# Patient Record
Sex: Female | Born: 1955
Health system: Southern US, Community
[De-identification: ages and names within clinical notes are randomized; demographics above are authoritative.]

## PROBLEM LIST (undated history)

## (undated) DIAGNOSIS — R918 Other nonspecific abnormal finding of lung field: Secondary | ICD-10-CM

## (undated) DIAGNOSIS — K219 Gastro-esophageal reflux disease without esophagitis: Secondary | ICD-10-CM

## (undated) DIAGNOSIS — IMO0001 Reserved for inherently not codable concepts without codable children: Secondary | ICD-10-CM

## (undated) DIAGNOSIS — I10 Essential (primary) hypertension: Secondary | ICD-10-CM

## (undated) DIAGNOSIS — E032 Hypothyroidism due to medicaments and other exogenous substances: Secondary | ICD-10-CM

## (undated) DIAGNOSIS — D649 Anemia, unspecified: Secondary | ICD-10-CM

## (undated) DIAGNOSIS — C801 Malignant (primary) neoplasm, unspecified: Secondary | ICD-10-CM

## (undated) DIAGNOSIS — E785 Hyperlipidemia, unspecified: Secondary | ICD-10-CM

## (undated) DIAGNOSIS — E119 Type 2 diabetes mellitus without complications: Secondary | ICD-10-CM

## (undated) DIAGNOSIS — E278 Other specified disorders of adrenal gland: Secondary | ICD-10-CM

## (undated) DIAGNOSIS — C7951 Secondary malignant neoplasm of bone: Secondary | ICD-10-CM

## (undated) HISTORY — DX: Hypothyroidism due to medicaments and other exogenous substances: E03.2

## (undated) HISTORY — PX: APPENDECTOMY: SHX54

## (undated) HISTORY — DX: Secondary malignant neoplasm of bone: C79.51

---

## 2003-07-15 ENCOUNTER — Emergency Department (HOSPITAL_COMMUNITY): Admission: EM | Admit: 2003-07-15 | Discharge: 2003-07-15 | Payer: Self-pay | Admitting: Emergency Medicine

## 2003-07-15 ENCOUNTER — Encounter: Payer: Self-pay | Admitting: Emergency Medicine

## 2005-08-19 ENCOUNTER — Ambulatory Visit: Payer: Self-pay | Admitting: Gastroenterology

## 2005-08-30 ENCOUNTER — Ambulatory Visit: Payer: Self-pay | Admitting: Gastroenterology

## 2006-06-07 ENCOUNTER — Emergency Department (HOSPITAL_COMMUNITY): Admission: EM | Admit: 2006-06-07 | Discharge: 2006-06-07 | Payer: Self-pay | Admitting: Emergency Medicine

## 2007-12-13 ENCOUNTER — Ambulatory Visit: Payer: Self-pay | Admitting: Physician Assistant

## 2008-12-16 ENCOUNTER — Ambulatory Visit: Payer: Self-pay

## 2009-11-21 HISTORY — PX: FLEXIBLE SIGMOIDOSCOPY: SHX1649

## 2009-12-17 ENCOUNTER — Ambulatory Visit: Payer: Self-pay

## 2010-10-08 ENCOUNTER — Encounter: Payer: Self-pay | Admitting: Gastroenterology

## 2010-10-22 ENCOUNTER — Ambulatory Visit (HOSPITAL_COMMUNITY)
Admission: RE | Admit: 2010-10-22 | Discharge: 2010-10-22 | Payer: Self-pay | Source: Home / Self Care | Admitting: Gastroenterology

## 2010-10-28 ENCOUNTER — Encounter (INDEPENDENT_AMBULATORY_CARE_PROVIDER_SITE_OTHER): Payer: Self-pay

## 2010-12-21 NOTE — Letter (Signed)
Summary: TCS ORDER/TRIAGE  TCS ORDER/TRIAGE   Imported By: Rexene Alberts 10/08/2010 15:46:49  _____________________________________________________________________  External Attachment:    Type:   Image     Comment:   External Document

## 2010-12-21 NOTE — Letter (Signed)
Summary: Patient Notice, Colon Biopsy Results  [facility]  [address]   [place], [place] [address]   Phone: [phone]  Fax: [phone]       October 28, 2010   [facility] [address] [place], [place]  [address] Nov 29, 1955    Dear Ms. Hodges,  I am pleased to inform you that the biopsies taken during your recent colonoscopy did not show any evidence of cancer upon pathologic examination.  Additional information/recommendations:  __Please follow a high fiber diet  __Continue with the treatment plan as outlined on the day of your exam.  __You should have a repeat colonoscopy examination  in 5 years.  Please call us if you are having persistent problems or have questions about your condition that have not been fully answered at this time.  Sincerely,    Hendricks Limes LPN  [facility] Ph: [phone]    Fax: [phone]

## 2010-12-29 ENCOUNTER — Ambulatory Visit: Payer: Self-pay | Admitting: Family Medicine

## 2013-03-08 ENCOUNTER — Emergency Department (HOSPITAL_COMMUNITY)
Admission: EM | Admit: 2013-03-08 | Discharge: 2013-03-08 | Disposition: A | Discharge status: Home / Self Care | Payer: PRIVATE HEALTH INSURANCE | Attending: Emergency Medicine | Admitting: Emergency Medicine

## 2013-03-08 ENCOUNTER — Encounter (HOSPITAL_COMMUNITY): Payer: Self-pay | Admitting: *Deleted

## 2013-03-08 DIAGNOSIS — L509 Urticaria, unspecified: Secondary | ICD-10-CM | POA: Insufficient documentation

## 2013-03-08 DIAGNOSIS — Z79899 Other long term (current) drug therapy: Secondary | ICD-10-CM | POA: Insufficient documentation

## 2013-03-08 DIAGNOSIS — R6889 Other general symptoms and signs: Secondary | ICD-10-CM | POA: Insufficient documentation

## 2013-03-08 DIAGNOSIS — R21 Rash and other nonspecific skin eruption: Secondary | ICD-10-CM | POA: Insufficient documentation

## 2013-03-08 MED ORDER — PREDNISONE 20 MG PO TABS: ORAL_TABLET | ORAL | Status: DC

## 2013-03-08 MED ORDER — METHYLPREDNISOLONE SODIUM SUCC 125 MG IJ SOLR
125.0000 mg | Freq: Once | INTRAMUSCULAR | Status: AC
  Administered 2013-03-08: 125 mg via INTRAMUSCULAR
  Filled 2013-03-08: qty 2

## 2013-03-08 MED ORDER — FAMOTIDINE 20 MG PO TABS: 20.0000 mg | ORAL_TABLET | Freq: Two times a day (BID) | ORAL | Status: DC

## 2013-03-08 MED ORDER — FAMOTIDINE 20 MG PO TABS
20.0000 mg | ORAL_TABLET | Freq: Once | ORAL | Status: AC
  Administered 2013-03-08: 20 mg via ORAL
  Filled 2013-03-08: qty 1

## 2013-03-08 MED ORDER — DIPHENHYDRAMINE HCL 50 MG/ML IJ SOLN
50.0000 mg | Freq: Once | INTRAMUSCULAR | Status: AC
  Administered 2013-03-08: 50 mg via INTRAMUSCULAR
  Filled 2013-03-08: qty 1

## 2013-03-08 NOTE — ED Notes (Signed)
Hives to face and all over body , took benadryl 50 mg at 3pm.  Onset Monday

## 2013-03-08 NOTE — ED Provider Notes (Signed)
History    This chart was scribed for Ward Givens, MD by Donne Anon, ED Scribe. This patient was seen in room APA19/APA19 and the patient's care was started at 1648.   CSN: [id number]  Arrival date & time 03/08/13  1607   First MD Initiated Contact with Patient 03/08/13 1648      No chief complaint on file.   The history is provided by the patient. No language interpreter was used.   Melissa Sullivan is a 57 y.o. female who presents to the Emergency Department complaining of gradual onset, moderate hives and rash which began 5 days ago. She states she has never had hives due to pollen before, but has had sneezing and an itchy throat. She denies swelling of her lips, tongue or throat, difficulty swallowing, fever, wheeing, difficulty breathing or any other pain. She denies any new exposures. She has tried Benadryl (2 pills today) at 3 pm which relieved the itching but not the hives.   Her PCP is Economist, PA-C at [facility].   She currently takes cholesteral and a stomach acid pill.  History reviewed. No pertinent past medical history.  Past Surgical History  Procedure Laterality Date  . Appendectomy      History reviewed. No pertinent family history.  History  Substance Use Topics  . Smoking status: Never Smoker   . Smokeless tobacco: Not on file  . Alcohol Use: No  employed  OB History   Grav Para Term Preterm Abortions TAB SAB Ect Mult Living                  Review of Systems  Constitutional: Negative for fever.  HENT: Positive for sneezing. Negative for facial swelling and trouble swallowing.   Respiratory: Negative for shortness of breath and wheezing.   Skin: Positive for rash.  All other systems reviewed and are negative.    Allergies  Review of patient's allergies indicates no known allergies.  Home Medications   Current Outpatient Rx  Name  Route  Sig  Dispense  Refill  . diphenhydrAMINE (BENADRYL) 25 MG tablet   Oral   Take 50 mg by  mouth once as needed for itching or allergies.         Marland Kitchen omeprazole (PRILOSEC) 20 MG capsule   Oral   Take 20 mg by mouth daily.         . simvastatin (ZOCOR) 20 MG tablet   Oral   Take 20 mg by mouth every evening.           BP 144/86  Pulse 100  Temp(Src) 98.1 F (36.7 C) (Oral)  Resp 18  Ht 4\' 11"  (1.499 m)  Wt 104 lb (47.174 kg)  BMI 20.99 kg/m2  Vital signs normal    Physical Exam  Nursing note and vitals reviewed. Constitutional: She is oriented to person, place, and time. She appears well-developed and well-nourished.  Non-toxic appearance. She does not appear ill. No distress.  HENT:  Head: Normocephalic and atraumatic.  Right Ear: External ear normal.  Left Ear: External ear normal.  Nose: Nose normal. No mucosal edema or rhinorrhea.  Mouth/Throat: Oropharynx is clear and moist and mucous membranes are normal. No dental abscesses or edematous.  No swelling of the oropharynx.  Eyes: Conjunctivae and EOM are normal. Pupils are equal, round, and reactive to light.  Neck: Normal range of motion and full passive range of motion without pain. Neck supple.  Cardiovascular: Normal rate, regular rhythm and  normal heart sounds.  Exam reveals no gallop and no friction rub.   No murmur heard. Pulmonary/Chest: Effort normal and breath sounds normal. No respiratory distress. She has no wheezes. She has no rhonchi. She has no rales. She exhibits no tenderness and no crepitus.  Abdominal: Soft. Normal appearance and bowel sounds are normal. She exhibits no distension. There is no tenderness. There is no rebound and no guarding.  Musculoskeletal: Normal range of motion. She exhibits no edema and no tenderness.  Moves all extremities well.   Neurological: She is alert and oriented to person, place, and time. She has normal strength. No cranial nerve deficit.  Skin: Skin is warm, dry and intact. No rash noted. No erythema. No pallor.  Diffuse sometimes coalescing red raised  urticarial type lesions over her whole body. No involvement of the palms or soles.  Psychiatric: She has a normal mood and affect. Her speech is normal and behavior is normal. Her mood appears not anxious.    ED Course  Procedures (including critical care time)  Medications  diphenhydrAMINE (BENADRYL) injection 50 mg (50 mg Intramuscular Given 03/08/13 1722)  methylPREDNISolone sodium succinate (SOLU-MEDROL) 125 mg/2 mL injection 125 mg (125 mg Intramuscular Given 03/08/13 1722)  famotidine (PEPCID) tablet 20 mg (20 mg Oral Given 03/08/13 1722)    DIAGNOSTIC STUDIES: None performed.   COORDINATION OF CARE: 5:10 PM Discussed treatment plan which includes medication with pt at bedside and pt agreed to plan.   6:18 PM Rechecked pt. She states that the rash is still present but the itching is improving. Family feels it is looking better.  Will discharge pt. Advised pt to return is she starts experiencing swelling on the lips or tongue.    1. Urticarial rash    New Prescriptions   FAMOTIDINE (PEPCID) 20 MG TABLET    Take 1 tablet (20 mg total) by mouth 2 (two) times daily.   PREDNISONE (DELTASONE) 20 MG TABLET    Take 3 po QD x 2d starting tomorrow, then 2 po QD x 3d then 1 po QD x 3d  benadryl or zyrtec OTC  Plan discharge  Devoria Albe, MD, FACEP    MDM   I personally performed the services described in this documentation, which was scribed in my presence. The recorded information has been reviewed and considered.  Devoria Albe, MD, Armando Gang         Ward Givens, MD 03/08/13 [phone]

## 2013-11-21 DIAGNOSIS — C801 Malignant (primary) neoplasm, unspecified: Secondary | ICD-10-CM

## 2013-11-21 HISTORY — DX: Malignant (primary) neoplasm, unspecified: C80.1

## 2014-07-22 HISTORY — PX: LUNG BIOPSY: SHX232

## 2014-07-28 ENCOUNTER — Emergency Department (HOSPITAL_COMMUNITY): Payer: PRIVATE HEALTH INSURANCE

## 2014-07-28 ENCOUNTER — Encounter (HOSPITAL_COMMUNITY): Payer: Self-pay | Admitting: Emergency Medicine

## 2014-07-28 ENCOUNTER — Emergency Department (HOSPITAL_COMMUNITY)
Admission: EM | Admit: 2014-07-28 | Discharge: 2014-07-28 | Disposition: A | Discharge status: Home / Self Care | Payer: PRIVATE HEALTH INSURANCE | Attending: Family Medicine | Admitting: Family Medicine

## 2014-07-28 DIAGNOSIS — K219 Gastro-esophageal reflux disease without esophagitis: Secondary | ICD-10-CM | POA: Insufficient documentation

## 2014-07-28 DIAGNOSIS — E876 Hypokalemia: Secondary | ICD-10-CM | POA: Diagnosis not present

## 2014-07-28 DIAGNOSIS — F17200 Nicotine dependence, unspecified, uncomplicated: Secondary | ICD-10-CM | POA: Diagnosis not present

## 2014-07-28 DIAGNOSIS — M25519 Pain in unspecified shoulder: Secondary | ICD-10-CM | POA: Diagnosis present

## 2014-07-28 DIAGNOSIS — E785 Hyperlipidemia, unspecified: Secondary | ICD-10-CM | POA: Insufficient documentation

## 2014-07-28 DIAGNOSIS — I10 Essential (primary) hypertension: Secondary | ICD-10-CM | POA: Insufficient documentation

## 2014-07-28 DIAGNOSIS — E279 Disorder of adrenal gland, unspecified: Secondary | ICD-10-CM

## 2014-07-28 DIAGNOSIS — Z79899 Other long term (current) drug therapy: Secondary | ICD-10-CM | POA: Insufficient documentation

## 2014-07-28 DIAGNOSIS — E278 Other specified disorders of adrenal gland: Secondary | ICD-10-CM | POA: Diagnosis not present

## 2014-07-28 DIAGNOSIS — R222 Localized swelling, mass and lump, trunk: Secondary | ICD-10-CM

## 2014-07-28 DIAGNOSIS — R918 Other nonspecific abnormal finding of lung field: Secondary | ICD-10-CM

## 2014-07-28 HISTORY — DX: Other nonspecific abnormal finding of lung field: R91.8

## 2014-07-28 HISTORY — DX: Gastro-esophageal reflux disease without esophagitis: K21.9

## 2014-07-28 HISTORY — DX: Reserved for inherently not codable concepts without codable children: IMO0001

## 2014-07-28 HISTORY — DX: Other specified disorders of adrenal gland: E27.8

## 2014-07-28 HISTORY — DX: Essential (primary) hypertension: I10

## 2014-07-28 HISTORY — DX: Hyperlipidemia, unspecified: E78.5

## 2014-07-28 LAB — CBC WITH DIFFERENTIAL/PLATELET
BASOS ABS: 0 10*3/uL (ref 0.0–0.1)
BASOS PCT: 0 % (ref 0–1)
Eosinophils Absolute: 0.2 10*3/uL (ref 0.0–0.7)
Eosinophils Relative: 1 % (ref 0–5)
HCT: 34.8 % — ABNORMAL LOW (ref 36.0–46.0)
Hemoglobin: 11.8 g/dL — ABNORMAL LOW (ref 12.0–15.0)
Lymphocytes Relative: 26 % (ref 12–46)
Lymphs Abs: 3.7 10*3/uL (ref 0.7–4.0)
MCH: 29.7 pg (ref 26.0–34.0)
MCHC: 33.9 g/dL (ref 30.0–36.0)
MCV: 87.7 fL (ref 78.0–100.0)
MONO ABS: 0.7 10*3/uL (ref 0.1–1.0)
Monocytes Relative: 5 % (ref 3–12)
NEUTROS ABS: 9.9 10*3/uL — AB (ref 1.7–7.7)
NEUTROS PCT: 68 % (ref 43–77)
Platelets: 413 10*3/uL — ABNORMAL HIGH (ref 150–400)
RBC: 3.97 MIL/uL (ref 3.87–5.11)
RDW: 15.5 % (ref 11.5–15.5)
WBC: 14.5 10*3/uL — ABNORMAL HIGH (ref 4.0–10.5)

## 2014-07-28 LAB — BASIC METABOLIC PANEL
ANION GAP: 18 — AB (ref 5–15)
BUN: 11 mg/dL (ref 6–23)
CHLORIDE: 96 meq/L (ref 96–112)
CO2: 26 mEq/L (ref 19–32)
CREATININE: 0.72 mg/dL (ref 0.50–1.10)
Calcium: 9.9 mg/dL (ref 8.4–10.5)
GFR calc non Af Amer: >90 mL/min (ref >90)
Glucose, Bld: 81 mg/dL (ref 70–99)
POTASSIUM: 2.8 meq/L — AB (ref 3.7–5.3)
Sodium: 140 mEq/L (ref 137–147)

## 2014-07-28 MED ORDER — MORPHINE SULFATE 4 MG/ML IJ SOLN
4.0000 mg | INTRAMUSCULAR | Status: DC | PRN
  Administered 2014-07-28: 4 mg via INTRAVENOUS
  Filled 2014-07-28: qty 1

## 2014-07-28 MED ORDER — POTASSIUM CHLORIDE 20 MEQ/15ML (10%) PO LIQD
40.0000 meq | Freq: Once | ORAL | Status: AC
Start: 2014-07-28 — End: 2014-07-28
  Administered 2014-07-28: 40 meq via ORAL
  Filled 2014-07-28: qty 30

## 2014-07-28 MED ORDER — POTASSIUM CHLORIDE ER 20 MEQ PO TBCR: 20.0000 meq | EXTENDED_RELEASE_TABLET | Freq: Two times a day (BID) | ORAL | Status: DC

## 2014-07-28 MED ORDER — IOHEXOL 300 MG/ML  SOLN
80.0000 mL | Freq: Once | INTRAMUSCULAR | Status: AC | PRN
Start: 2014-07-28 — End: 2014-07-28
  Administered 2014-07-28: 80 mL via INTRAVENOUS

## 2014-07-28 MED ORDER — HYDROCODONE-ACETAMINOPHEN 5-325 MG PO TABS: 1.0000 | ORAL_TABLET | Freq: Four times a day (QID) | ORAL | Status: DC | PRN

## 2014-07-28 NOTE — Consult Note (Addendum)
Medical Consult  Melissa Sullivan AOZ:[id number] DOB: 08-Dec-1955 DOA: 07/28/2014  Requesting physician: Francine Graven, MD in ED Reason for consultation: chest masxs  PCP: [name],[name], PA-C   Assessment/Plan 1. Large right apical mass suspicious for malignancy with evidence of metastatic disease to right adrenal gland. 2. Back and arm pain secondary to mass 3. Hypokalemia 4. Tobacco dependence   Discussed imaging results with Dr. Barbie Banner, interventional radiology. The apical mass is amenable to biopsy. The trachea and esophagus are widely patent. The IVC is patent and the right kidney has no evidence of ischemic changes. There are no findings requiring admission or urgent intervention. Discussed with Dr. Lona Kettle, oncology in [place]. Patient will need biopsy and then likely chemotherapy +/- radiation.  Discussed the above with patient, her husband, her daughter and niece. Reviewed treatment options including admission to [facility] with coordination of biopsy as inpatient, versus transfer to [facility] for biopsy and further care. She requests discharge home with outpatient biopsy and outpatient follow-up with the [facility]. She has a lot of support and appears reliable, therefore rec discharge home with oral narcotics and potassium supplementation.  In AM I will arrange for outpatient biopsy ASAP and follow-up with [facility] thereafter.   I have counseled her to seek medical attention for uncontrolled pain, SOB, dysphagia, weakness, or worsening of condition.   I discussed my recommendations with Dr. Milana Na partner Dr. Hillard Danker.  Patient instructions on discharge: Seek immediate medical assistance for uncontrolled pain, SOB, dysphagia, weakness, or worsening of condition. You will receive a call from the interventional radiology department at [facility] in [place] 9/8 to schedule a biopsy. You will also receive a call from the [facility] 9/8 to schedule an appointment. You have been  prescribed a pain medication called Lortab and a few potassium pills to treat low potassium.   Chief Complaint: right back and arm pain  HPI:  58 year old woman presented to [facility] ED with h/o back pain. Imaging revealed large right apical mass with evidence of metastatic disease. Medicine consulted for further recommendations.  Patient reports intermittent back and right arm pain since late 2014, but has become constant and progressively more severe over the last several months failing conservative therapy. Pain aggravated by movement of right arm and somewhat relieved when lying on right side. No weakness or neurologic symptoms. She has had weight loss and loss of appetite. No new SOB, no dysphagia. No systemic symptoms.  In the emergency department afebrile, VSS, no hypoxia. K+ 2.8, BMP otherwise unremarkable, Hgb 11.8.  CT chest as below.  Review of Systems:  Negative for fever, visual changes, sore throat, rash, new muscle aches, chest pain, SOB, dysuria, bleeding, n/v/abdominal pain.  Past Medical History  Diagnosis Date  . Hypertension   . Reflux   . Hyperlipidemia     Past Surgical History  Procedure Laterality Date  . Appendectomy      Social History:  reports that she has been smoking Cigarettes.  She has been smoking about 0.30 packs per day. She does not have any smokeless tobacco history on file. She reports that she does not drink alcohol or use illicit drugs.  No Known Allergies  Family History  Problem Relation Age of Onset  . Cancer Sister      Prior to Admission medications   Medication Sig Start Date End Date Taking? Authorizing Provider  hydrochlorothiazide (MICROZIDE) 12.5 MG capsule Take 12.5 mg by mouth daily.   Yes Historical Provider, MD  ibuprofen (  ADVIL,MOTRIN) 200 MG tablet Take 400 mg by mouth every 6 (six) hours as needed for headache or mild pain.   Yes Historical Provider, MD  Multiple Vitamin (MULTIVITAMIN WITH MINERALS) TABS tablet Take 1 tablet  by mouth daily.   Yes Historical Provider, MD  omeprazole (PRILOSEC) 20 MG capsule Take 20 mg by mouth daily.   Yes Historical Provider, MD  simvastatin (ZOCOR) 40 MG tablet Take 40 mg by mouth daily.   Yes Historical Provider, MD   Physical Exam: Filed Vitals:   07/28/14 1131 07/28/14 1359 07/28/14 1417  BP: 125/81 120/82 127/75  Pulse: 91 85 78  Temp: 98.8 F (37.1 C)    TempSrc: Oral    Resp: 17 20 16   Height: 4\' 11"  (1.499 m)    Weight: 46.267 kg (102 lb)    SpO2: 99% 100% 100%    General: Examined in emergency department.  Appears calm, mildly uncomfortable. Ambulates without assistance.  Eyes: PERRL, normal lids, irises  ENT: grossly normal hearing, lips & tongue Neck: no LAD, masses or thyromegaly Cardiovascular: RRR, no m/r/g. No LE edema. Respiratory: CTA bilaterally, no w/r/r. Normal respiratory effort. Abdomen: soft, ntnd, no masses appreciated Skin: no rash or induration Musculoskeletal: grossly normal tone/movement BUE/BLE. Tender to palpation over right upper back, anterior chest.  Breasts: examined with chaperon Barnett Applebaum, RN. Bilateral breasts unremarkable on exam. Psychiatric: grossly normal mood and affect, speech fluent and appropriate Neurologic: grossly non-focal.  Wt Readings from Last 3 Encounters:  07/28/14 46.267 kg (102 lb)  03/08/13 47.174 kg (104 lb)    Labs on Admission:  Basic Metabolic Panel:  Recent Labs Lab 07/28/14 1253  NA 140  K 2.8*  CL 96  CO2 26  GLUCOSE 81  BUN 11  CREATININE 0.72  CALCIUM 9.9     CBC:  Recent Labs Lab 07/28/14 1253  WBC 14.5*  NEUTROABS 9.9*  HGB 11.8*  HCT 34.8*  MCV 87.7  PLT 413*    Radiological Exams on Admission: Dg Shoulder Right  07/28/2014   CLINICAL DATA:  Right shoulder pain for 1 month.  No injury.  EXAM: RIGHT SHOULDER - 2+ VIEW  COMPARISON:  None.  FINDINGS: Abnormal thick density at the right lung apex, with apparent erosion/destruction of the right second rib. Appearance highly  concerning for Pancoast tumor, possibly with associated right upper lobe atelectasis. Chest CT with contrast recommended for further workup.  IMPRESSION: 1. Suspected right apical malignancy with destruction of the right second rib. Possible right upper lobe atelectasis is associated. CT chest with contrast recommended. These results were called by telephone at the time of interpretation on 07/28/2014 at 12:26 pm to the [facility] ED physician, who verbally acknowledged these results.   Electronically Signed   By: Sherryl Barters M.D.   On: 07/28/2014 12:29   Ct Chest W Contrast  07/28/2014   CLINICAL DATA:  58 year old female presenting with right shoulder pain. Abnormal plain film exam. Hypertension. Hyperlipidemia.  EXAM: CT CHEST WITH CONTRAST  TECHNIQUE: Multidetector CT imaging of the chest was performed during intravenous contrast administration.  CONTRAST:  69mL OMNIPAQUE IOHEXOL 300 MG/ML  SOLN  COMPARISON:  07/28/2014 plain film exam of the right shoulder. No comparison CT.  FINDINGS: 8.9 x 8.1 x 6.8 cm right apical destructive mass consistent with malignancy (Pancoast tumor). This destroys the posterior aspect of the right second rib with extension into adjacent soft tissues posterior to the destroyed rib and supraclavicular region. Additionally, tumor extends partially into the right T2-3  and T3-4 neural foramen. This mass extends into the mediastinum displacing the esophagus to left and compresses the posterior aspect of the trachea. Invasion of the structures cannot be excluded.  Right hilar adenopathy with transverse short axis dimension 1.3 cm.  5 cm right adrenal mass most consistent with metastatic disease. This impresses upon the right kidney, liver, inferior vena cava (which is narrowed) and surrounds the right renal artery right and right renal vein which are significantly narrowed. This metastatic lesion may extend through the right crus of the diaphragm.  Parenchymal changes medial aspect of  the left upper lobe may represent inflammatory process rather than spread tumor (which cannot be entirely excluded).  Exam was not performed with pulmonary embolus technique however, no obvious pulmonary embolus detected.  Heart size within normal limits.  IMPRESSION: Large right apical mass consistent with malignancy. This is destroying the right second rib with extension into adjacent soft tissue (posterior to the ribs and right supraclavicular region). There is a suggestion of extension tumor partially into the right T2-3 in T3-4 neural foramen. Suggestion of minimal erosion of these vertebra. This mass extends into the mediastinum displacing the trachea and esophagus and at points is inseparable from the structures.  Right hilar adenopathy.  Right adrenal 5 cm mass consistent with metastatic disease causing compression of surrounding structures including narrowing of the inferior vena cava and significant narrowing of the right renal artery and vein.  Parenchymal changes medial aspect left upper lobe may represent result of inflammatory process versus tumor.  These results were called by telephone at the time of interpretation on 07/28/2014 at 2:15 pm to Dr. Francine Graven , who verbally acknowledged these results.   Electronically Signed   By: Chauncey Cruel M.D.   On: 07/28/2014 14:16     Active Problems:   * No active hospital problems. *      Time spent: 60 minutes  Murray Hodgkins, MD  [facility] Pager [phone] 07/28/2014, 3:08 PM

## 2014-07-28 NOTE — Discharge Instructions (Signed)
Seek immediate medical assistance for uncontrolled pain, SOB, dysphagia, weakness, or worsening of condition. You will receive a call from the interventional radiology department at [facility] in [place] 9/8 to schedule a biopsy. You will also receive a call from the [facility] 9/8 to schedule an appointment. You have been prescribed a pain medication called Lortab and a few potassium pills to treat low potassium.

## 2014-07-28 NOTE — ED Notes (Signed)
Notified MD on critical value from laboratory of Potassium level 2.8. Will keep monitoring pt.

## 2014-07-28 NOTE — ED Notes (Signed)
Pt c/o of RT shoulder pain for past month. Pt denies any injury.

## 2014-07-28 NOTE — ED Provider Notes (Signed)
CSN: [id number]     Arrival date & time 07/28/14  1126 History   First MD Initiated Contact with Patient 07/28/14 1233     Chief Complaint  Patient presents with  . Shoulder Pain     HPI Pt was seen at 1230.  Per pt and her family, c/o gradual onset and worsening of persistent right shoulder "pain" for the past 1+ month. Pt states she has been evaluated by her PMD for same and tx with physical therapy, which pt did not feel improved her pain. Describes her pain as "sharp" and constant, worsens with palpation of her right shoulder and upper chest. Denies injury, no CP/palpitations, no SOB/cough, no back pain, no abd pain, no N/V/D, no fevers, no rash, no focal motor weakness, no tingling/numbness in extremities.    Past Medical History  Diagnosis Date  . Hypertension   . Reflux   . Hyperlipidemia    Past Surgical History  Procedure Laterality Date  . Appendectomy     Family History  Problem Relation Age of Onset  . Cancer Sister    History  Substance Use Topics  . Smoking status: Light Tobacco Smoker -- 0.30 packs/day    Types: Cigarettes  . Smokeless tobacco: Not on file  . Alcohol Use: No    Review of Systems ROS: Statement: All systems negative except as marked or noted in the HPI; Constitutional: Negative for fever and chills. ; ; Eyes: Negative for eye pain, redness and discharge. ; ; ENMT: Negative for ear pain, hoarseness, nasal congestion, sinus pressure and sore throat. ; ; Cardiovascular: Negative for chest pain, palpitations, diaphoresis, dyspnea and peripheral edema. ; ; Respiratory: Negative for cough, wheezing and stridor. ; ; Gastrointestinal: Negative for nausea, vomiting, diarrhea, abdominal pain, blood in stool, hematemesis, jaundice and rectal bleeding. . ; ; Genitourinary: Negative for dysuria, flank pain and hematuria. ; ; Musculoskeletal: +right shoulder area pain. Negative for back pain and neck pain. Negative for swelling and trauma.; ; Skin: Negative for  pruritus, rash, abrasions, blisters, bruising and skin lesion.; ; Neuro: Negative for headache, lightheadedness and neck stiffness. Negative for weakness, altered level of consciousness , altered mental status, extremity weakness, paresthesias, involuntary movement, seizure and syncope.      Allergies  Review of patient's allergies indicates no known allergies.  Home Medications   Prior to Admission medications   Medication Sig Start Date End Date Taking? Authorizing Provider  hydrochlorothiazide (MICROZIDE) 12.5 MG capsule Take 12.5 mg by mouth daily.   Yes Historical Provider, MD  ibuprofen (ADVIL,MOTRIN) 200 MG tablet Take 400 mg by mouth every 6 (six) hours as needed for headache or mild pain.   Yes Historical Provider, MD  Multiple Vitamin (MULTIVITAMIN WITH MINERALS) TABS tablet Take 1 tablet by mouth daily.   Yes Historical Provider, MD  omeprazole (PRILOSEC) 20 MG capsule Take 20 mg by mouth daily.   Yes Historical Provider, MD  simvastatin (ZOCOR) 40 MG tablet Take 40 mg by mouth daily.   Yes Historical Provider, MD   BP 127/75  Pulse 78  Temp(Src) 98.8 F (37.1 C) (Oral)  Resp 16  Ht 4\' 11"  (1.499 m)  Wt 102 lb (46.267 kg)  BMI 20.59 kg/m2  SpO2 100% Physical Exam 1235: Physical examination:  Nursing notes reviewed; Vital signs and O2 SAT reviewed;  Constitutional: Well developed, Well nourished, Well hydrated, In no acute distress; Head:  Normocephalic, atraumatic; Eyes: EOMI, PERRL, No scleral icterus; ENMT: Mouth and pharynx normal, Mucous membranes moist; Neck:  Supple, Full range of motion, No lymphadenopathy; Cardiovascular: Regular rate and rhythm, No murmur, rub, or gallop; Respiratory: Breath sounds clear & equal bilaterally, No rales, rhonchi, wheezes.  Speaking full sentences with ease, Normal respiratory effort/excursion; Chest: +right proximal anterior and lateral chest wall tenderness to palp. No rash, no deformity. Movement normal; Abdomen: Soft, Nontender,  Nondistended, Normal bowel sounds; Genitourinary: No CVA tenderness; Extremities: Pulses normal, +generalized TTP right anterior shoulder, right clavicle, and right proximal anterior and lateral chest wall. No deformity, no rash, no edema.; Neuro: AA&Ox3, Major CN grossly intact.  Speech clear. No gross focal motor or sensory deficits in extremities. Climbs on and off stretcher easily by herself. Gait steady.; Skin: Color normal, Warm, Dry.   ED Course  Procedures     EKG Interpretation None      MDM  MDM Reviewed: previous chart, nursing note and vitals Reviewed previous: labs Interpretation: labs, x-ray and CT scan    Results for orders placed during the hospital encounter of 07/28/14  CBC WITH DIFFERENTIAL      Result Value Ref Range   WBC 14.5 (*) 4.0 - 10.5 K/uL   RBC 3.97  3.87 - 5.11 MIL/uL   Hemoglobin 11.8 (*) 12.0 - 15.0 g/dL   HCT 34.8 (*) 36.0 - 46.0 %   MCV 87.7  78.0 - 100.0 fL   MCH 29.7  26.0 - 34.0 pg   MCHC 33.9  30.0 - 36.0 g/dL   RDW 15.5  11.5 - 15.5 %   Platelets 413 (*) 150 - 400 K/uL   Neutrophils Relative % 68  43 - 77 %   Neutro Abs 9.9 (*) 1.7 - 7.7 K/uL   Lymphocytes Relative 26  12 - 46 %   Lymphs Abs 3.7  0.7 - 4.0 K/uL   Monocytes Relative 5  3 - 12 %   Monocytes Absolute 0.7  0.1 - 1.0 K/uL   Eosinophils Relative 1  0 - 5 %   Eosinophils Absolute 0.2  0.0 - 0.7 K/uL   Basophils Relative 0  0 - 1 %   Basophils Absolute 0.0  0.0 - 0.1 K/uL  BASIC METABOLIC PANEL      Result Value Ref Range   Sodium 140  137 - 147 mEq/L   Potassium 2.8 (*) 3.7 - 5.3 mEq/L   Chloride 96  96 - 112 mEq/L   CO2 26  19 - 32 mEq/L   Glucose, Bld 81  70 - 99 mg/dL   BUN 11  6 - 23 mg/dL   Creatinine, Ser 0.72  0.50 - 1.10 mg/dL   Calcium 9.9  8.4 - 10.5 mg/dL   GFR calc non Af Amer >90  >90 mL/min   GFR calc Af Amer >90  >90 mL/min   Anion gap 18 (*) 5 - 15   Dg Shoulder Right 07/28/2014   CLINICAL DATA:  Right shoulder pain for 1 month.  No injury.  EXAM:  RIGHT SHOULDER - 2+ VIEW  COMPARISON:  None.  FINDINGS: Abnormal thick density at the right lung apex, with apparent erosion/destruction of the right second rib. Appearance highly concerning for Pancoast tumor, possibly with associated right upper lobe atelectasis. Chest CT with contrast recommended for further workup.  IMPRESSION: 1. Suspected right apical malignancy with destruction of the right second rib. Possible right upper lobe atelectasis is associated. CT chest with contrast recommended. These results were called by telephone at the time of interpretation on 07/28/2014 at 12:26 pm to the  [facility] ED physician, who verbally acknowledged these results.   Electronically Signed   By: Sherryl Barters M.D.   On: 07/28/2014 12:29   Ct Chest W Contrast 07/28/2014   CLINICAL DATA:  58 year old female presenting with right shoulder pain. Abnormal plain film exam. Hypertension. Hyperlipidemia.  EXAM: CT CHEST WITH CONTRAST  TECHNIQUE: Multidetector CT imaging of the chest was performed during intravenous contrast administration.  CONTRAST:  53mL OMNIPAQUE IOHEXOL 300 MG/ML  SOLN  COMPARISON:  07/28/2014 plain film exam of the right shoulder. No comparison CT.  FINDINGS: 8.9 x 8.1 x 6.8 cm right apical destructive mass consistent with malignancy (Pancoast tumor). This destroys the posterior aspect of the right second rib with extension into adjacent soft tissues posterior to the destroyed rib and supraclavicular region. Additionally, tumor extends partially into the right T2-3 and T3-4 neural foramen. This mass extends into the mediastinum displacing the esophagus to left and compresses the posterior aspect of the trachea. Invasion of the structures cannot be excluded.  Right hilar adenopathy with transverse short axis dimension 1.3 cm.  5 cm right adrenal mass most consistent with metastatic disease. This impresses upon the right kidney, liver, inferior vena cava (which is narrowed) and surrounds the right renal  artery right and right renal vein which are significantly narrowed. This metastatic lesion may extend through the right crus of the diaphragm.  Parenchymal changes medial aspect of the left upper lobe may represent inflammatory process rather than spread tumor (which cannot be entirely excluded).  Exam was not performed with pulmonary embolus technique however, no obvious pulmonary embolus detected.  Heart size within normal limits.  IMPRESSION: Large right apical mass consistent with malignancy. This is destroying the right second rib with extension into adjacent soft tissue (posterior to the ribs and right supraclavicular region). There is a suggestion of extension tumor partially into the right T2-3 in T3-4 neural foramen. Suggestion of minimal erosion of these vertebra. This mass extends into the mediastinum displacing the trachea and esophagus and at points is inseparable from the structures.  Right hilar adenopathy.  Right adrenal 5 cm mass consistent with metastatic disease causing compression of surrounding structures including narrowing of the inferior vena cava and significant narrowing of the right renal artery and vein.  Parenchymal changes medial aspect left upper lobe may represent result of inflammatory process versus tumor.  These results were called by telephone at the time of interpretation on 07/28/2014 at 2:15 pm to Dr. Francine Graven , who verbally acknowledged these results.   Electronically Signed   By: Chauncey Cruel M.D.   On: 07/28/2014 14:16    1430:  Potassium repleted PO. Dx and testing d/w pt and family.  Questions answered.  Verb understanding, agreeable to admit.  T/C to [facility] Dr. Sarajane Jews, case discussed, including:  HPI, pertinent PM/SHx, VS/PE, dx testing, ED course and treatment:  Agrees pt needs admission for further dx testing and pain management; he will call Heme/Onc MD at [facility] to clarify if pt should stay here at [facility] or be transferred to [facility]; he will then come evaluate pt  and discuss plan. Pt and family updated.   1500:  [facility] Dr. Sarajane Jews at bedside talking with family.  Francine Graven, DO 07/29/14 1701

## 2014-07-28 NOTE — ED Notes (Signed)
Dr.McManus received phone call from pt's PCP, pt needs a CT.

## 2014-07-29 ENCOUNTER — Other Ambulatory Visit (HOSPITAL_COMMUNITY): Payer: Self-pay | Admitting: Oncology

## 2014-07-29 DIAGNOSIS — E278 Other specified disorders of adrenal gland: Secondary | ICD-10-CM

## 2014-07-29 DIAGNOSIS — R918 Other nonspecific abnormal finding of lung field: Secondary | ICD-10-CM

## 2014-07-31 ENCOUNTER — Other Ambulatory Visit: Payer: Self-pay | Admitting: Radiology

## 2014-08-01 ENCOUNTER — Ambulatory Visit (HOSPITAL_COMMUNITY)
Admission: RE | Admit: 2014-08-01 | Discharge: 2014-08-01 | Disposition: A | Discharge status: Home / Self Care | Payer: PRIVATE HEALTH INSURANCE | Source: Ambulatory Visit | Attending: Oncology | Admitting: Oncology

## 2014-08-01 ENCOUNTER — Other Ambulatory Visit (HOSPITAL_COMMUNITY): Payer: Self-pay | Admitting: Oncology

## 2014-08-01 ENCOUNTER — Encounter (HOSPITAL_COMMUNITY): Payer: Self-pay | Admitting: Oncology

## 2014-08-01 ENCOUNTER — Encounter (HOSPITAL_COMMUNITY): Payer: Self-pay

## 2014-08-01 DIAGNOSIS — R918 Other nonspecific abnormal finding of lung field: Secondary | ICD-10-CM

## 2014-08-01 DIAGNOSIS — R222 Localized swelling, mass and lump, trunk: Secondary | ICD-10-CM | POA: Diagnosis not present

## 2014-08-01 LAB — APTT: aPTT: 31 seconds (ref 24–37)

## 2014-08-01 LAB — CBC
HCT: 33.4 % — ABNORMAL LOW (ref 36.0–46.0)
HEMOGLOBIN: 11.2 g/dL — AB (ref 12.0–15.0)
MCH: 29.6 pg (ref 26.0–34.0)
MCHC: 33.5 g/dL (ref 30.0–36.0)
MCV: 88.1 fL (ref 78.0–100.0)
PLATELETS: 376 10*3/uL (ref 150–400)
RBC: 3.79 MIL/uL — AB (ref 3.87–5.11)
RDW: 16.2 % — ABNORMAL HIGH (ref 11.5–15.5)
WBC: 11.2 10*3/uL — AB (ref 4.0–10.5)

## 2014-08-01 LAB — PROTIME-INR
INR: 1.08 (ref 0.00–1.49)
PROTHROMBIN TIME: 14 s (ref 11.6–15.2)

## 2014-08-01 MED ORDER — MIDAZOLAM HCL 2 MG/2ML IJ SOLN
INTRAMUSCULAR | Status: AC | PRN
  Administered 2014-08-01: 1 mg via INTRAVENOUS
  Administered 2014-08-01: 0.5 mg via INTRAVENOUS

## 2014-08-01 MED ORDER — HYDROCODONE-ACETAMINOPHEN 5-325 MG PO TABS: 1.0000 | ORAL_TABLET | Freq: Four times a day (QID) | ORAL | Status: DC | PRN

## 2014-08-01 MED ORDER — FENTANYL CITRATE 0.05 MG/ML IJ SOLN
INTRAMUSCULAR | Status: AC | PRN
  Administered 2014-08-01: 25 ug via INTRAVENOUS
  Administered 2014-08-01: 50 ug via INTRAVENOUS

## 2014-08-01 MED ORDER — SODIUM CHLORIDE 0.9 % IV SOLN
INTRAVENOUS | Status: AC | PRN
  Administered 2014-08-01: 10 mL/h via INTRAVENOUS

## 2014-08-01 MED ORDER — LIDOCAINE HCL 1 % IJ SOLN
INTRAMUSCULAR | Status: AC
  Filled 2014-08-01: qty 10

## 2014-08-01 MED ORDER — HYDROCODONE-ACETAMINOPHEN 5-325 MG PO TABS
1.0000 | ORAL_TABLET | Freq: Once | ORAL | Status: AC
  Administered 2014-08-01: 2 via ORAL
  Filled 2014-08-01: qty 2

## 2014-08-01 MED ORDER — MIDAZOLAM HCL 2 MG/2ML IJ SOLN
INTRAMUSCULAR | Status: AC
Start: 2014-08-01 — End: 2014-08-01
  Filled 2014-08-01: qty 4

## 2014-08-01 MED ORDER — SODIUM CHLORIDE 0.9 % IV SOLN
Freq: Once | INTRAVENOUS | Status: AC
  Administered 2014-08-01: 07:00:00 via INTRAVENOUS

## 2014-08-01 MED ORDER — HYDROCODONE-ACETAMINOPHEN 5-325 MG PO TABS
ORAL_TABLET | ORAL | Status: AC
  Filled 2014-08-01: qty 2

## 2014-08-01 MED ORDER — FENTANYL CITRATE 0.05 MG/ML IJ SOLN
INTRAMUSCULAR | Status: AC
  Filled 2014-08-01: qty 2

## 2014-08-01 NOTE — Discharge Instructions (Signed)
Biopsy Care After These instructions give you information on caring for yourself after your procedure. Your doctor may also give you more specific instructions. Call your doctor if you have any problems or questions after your procedure. HOME CARE   Return to your normal diet and activities as told by your doctor.  Change your bandages (dressings) as told by your doctor. If skin glue (adhesive) was used, it will peel off in 7 days.  Only take medicines as told by your doctor.  Ask your doctor when you can bathe and get your wound wet. GET HELP RIGHT AWAY IF:  You see more than a small spot of blood coming from the wound.  You have redness, puffiness (swelling), or pain.  You see yellowish-white fluid (pus) coming from the wound.  You have a fever.  You notice a bad smell coming from the wound or bandage.  You have a rash, trouble breathing, or any allergy problems. MAKE SURE YOU:   Understand these instructions.  Will watch your condition.  Will get help right away if you are not doing well or get worse. Document Released: 07/12/2011 Document Revised: 01/30/2012 Document Reviewed: 07/12/2011 Exit[employer] Patient Information 2015 [employer]. This information is not intended to replace advice given to you by your health care provider. Make sure you discuss any questions you have with your health care provider.  

## 2014-08-01 NOTE — H&P (Signed)
Chief Complaint: Rt chest/shoulder and back pain x 1 yr  Referring Physician(s): Kefalas,Thomas S  History of Present Illness: Melissa Sullivan is a 58 y.o. female  Pt has noticed back pain and Rt shoulder pain x 1 yr Has been treated for pain with meds and therapy Pain worsening CT and Xray 07/28/2014 reveals Rt apical/pancoast tumor Also noted Rt adrenal met Pt is now scheduled for [name] biopsy in IR Smoker   Past Medical History  Diagnosis Date  . Hypertension   . Reflux   . Hyperlipidemia   . Lung mass 07/28/2014  . Adrenal mass, right 07/28/2014    Past Surgical History  Procedure Laterality Date  . Appendectomy      Allergies: Review of patient's allergies indicates no known allergies.  Medications: Prior to Admission medications   Medication Sig Start Date End Date Taking? Authorizing Provider  hydrochlorothiazide (MICROZIDE) 12.5 MG capsule Take 12.5 mg by mouth daily.   Yes Historical Provider, MD  HYDROcodone-acetaminophen (NORCO/VICODIN) 5-325 MG per tablet Take 1 tablet by mouth every 6 (six) hours as needed for moderate pain. 07/28/14  Yes Samuella Cota, MD  Multiple Vitamin (MULTIVITAMIN WITH MINERALS) TABS tablet Take 1 tablet by mouth daily.   Yes Historical Provider, MD  omeprazole (PRILOSEC) 20 MG capsule Take 20 mg by mouth daily.   Yes Historical Provider, MD  potassium chloride 20 MEQ TBCR Take 20 mEq by mouth 2 (two) times daily. 07/28/14  Yes Samuella Cota, MD  simvastatin (ZOCOR) 40 MG tablet Take 40 mg by mouth daily.   Yes Historical Provider, MD    Family History  Problem Relation Age of Onset  . Cancer Sister     History   Social History  . Marital Status: Married    Spouse Name: N/A    Number of Children: N/A  . Years of Education: N/A   Social History Main Topics  . Smoking status: Light Tobacco Smoker -- 0.30 packs/day    Types: Cigarettes  . Smokeless tobacco: None  . Alcohol Use: No  . Drug Use: No  .  Sexual Activity: None   Other Topics Concern  . None   Social History Narrative  . None     Review of Systems: A 12 point ROS discussed and pertinent positives are indicated in the HPI above.  All other systems are negative.  Review of Systems  Constitutional: Negative for fever.  HENT: Negative for congestion.   Respiratory: Positive for chest tightness and shortness of breath. Negative for apnea.   Cardiovascular: Positive for chest pain.  Gastrointestinal: Negative for abdominal distention.  Musculoskeletal: Positive for back pain and neck pain.    Vital Signs: BP 136/65  Pulse 71  Temp(Src) 97.8 F (36.6 C) (Oral)  Resp 16  Ht _0  (1.499 m)  Wt 47.628 kg (105 lb)  BMI 21.20 kg/m2  SpO2 100%  Physical Exam  Constitutional: She is oriented to person, place, and time. She appears well-nourished.  Cardiovascular: Normal rate, regular rhythm and normal heart sounds.   No murmur heard. Pulmonary/Chest: Effort normal. No respiratory distress. She has no wheezes.  Abdominal: Soft. Bowel sounds are normal. She exhibits no distension.  Musculoskeletal: Normal range of motion.  Neurological: She is alert and oriented to person, place, and time.  Skin: Skin is warm and dry.  Psychiatric: She has a normal mood and affect. Her behavior is normal. Judgment and thought content normal.    Imaging: Dg  Shoulder Right  07/28/2014   CLINICAL DATA:  Right shoulder pain for 1 month.  No injury.  EXAM: RIGHT SHOULDER - 2+ VIEW  COMPARISON:  None.  FINDINGS: Abnormal thick density at the right lung apex, with apparent erosion/destruction of the right second rib. Appearance highly concerning for Pancoast tumor, possibly with associated right upper lobe atelectasis. Chest CT with contrast recommended for further workup.  IMPRESSION: 1. Suspected right apical malignancy with destruction of the right second rib. Possible right upper lobe atelectasis is associated. CT chest with contrast  recommended. These results were called by telephone at the time of interpretation on 07/28/2014 at 12:26 pm to the [facility] ED physician, who verbally acknowledged these results.   Electronically Signed   By: Sherryl Barters M.D.   On: 07/28/2014 12:29   Ct Chest W Contrast  07/28/2014   CLINICAL DATA:  58 year old female presenting with right shoulder pain. Abnormal plain film exam. Hypertension. Hyperlipidemia.  EXAM: CT CHEST WITH CONTRAST  TECHNIQUE: Multidetector CT imaging of the chest was performed during intravenous contrast administration.  CONTRAST:  39m OMNIPAQUE IOHEXOL 300 MG/ML  SOLN  COMPARISON:  07/28/2014 plain film exam of the right shoulder. No comparison CT.  FINDINGS: 8.9 x 8.1 x 6.8 cm right apical destructive mass consistent with malignancy (Pancoast tumor). This destroys the posterior aspect of the right second rib with extension into adjacent soft tissues posterior to the destroyed rib and supraclavicular region. Additionally, tumor extends partially into the right T2-3 and T3-4 neural foramen. This mass extends into the mediastinum displacing the esophagus to left and compresses the posterior aspect of the trachea. Invasion of the structures cannot be excluded.  Right hilar adenopathy with transverse short axis dimension 1.3 cm.  5 cm right adrenal mass most consistent with metastatic disease. This impresses upon the right kidney, liver, inferior vena cava (which is narrowed) and surrounds the right renal artery right and right renal vein which are significantly narrowed. This metastatic lesion may extend through the right crus of the diaphragm.  Parenchymal changes medial aspect of the left upper lobe may represent inflammatory process rather than spread tumor (which cannot be entirely excluded).  Exam was not performed with pulmonary embolus technique however, no obvious pulmonary embolus detected.  Heart size within normal limits.  IMPRESSION: Large right apical mass consistent with  malignancy. This is destroying the right second rib with extension into adjacent soft tissue (posterior to the ribs and right supraclavicular region). There is a suggestion of extension tumor partially into the right T2-3 in T3-4 neural foramen. Suggestion of minimal erosion of these vertebra. This mass extends into the mediastinum displacing the trachea and esophagus and at points is inseparable from the structures.  Right hilar adenopathy.  Right adrenal 5 cm mass consistent with metastatic disease causing compression of surrounding structures including narrowing of the inferior vena cava and significant narrowing of the right renal artery and vein.  Parenchymal changes medial aspect left upper lobe may represent result of inflammatory process versus tumor.  These results were called by telephone at the time of interpretation on 07/28/2014 at 2:15 pm to Dr. KFrancine Graven, who verbally acknowledged these results.   Electronically Signed   By: SChauncey CruelM.D.   On: 07/28/2014 14:16    Labs: Lab Results  Component Value Date   WBC 11.2* 08/01/2014   HCT 33.4* 08/01/2014   MCV 88.1 08/01/2014   PLT 376 08/01/2014   NA 140 07/28/2014   K 2.8*  07/28/2014   CL 96 07/28/2014   CO2 26 07/28/2014   GLUCOSE 81 07/28/2014   BUN 11 07/28/2014   CREATININE 0.72 07/28/2014   CALCIUM 9.9 07/28/2014   GFRNONAA >90 07/28/2014   GFRAA >90 07/28/2014   INR 1.08 08/01/2014    Assessment and Plan: Chest/Rt shoulder/back pain x 1 yr Treated with meds and therapy  Worsening pain  CT/Xray reveals Rt apical/pancoast tumor Now scheduled for bx of same Pt aware of procedure benefits and risks and agreeable to proceed Consent signed and in chart  Thank you for this interesting consult.  I greatly enjoyed meeting Melissa Sullivan and look forward to participating in their care.    Lavonia Drafts PAC-IR [phone]   I spent a total of 20 minutes face to face in clinical consultation, greater than 50% of which was  counseling/coordinating care for Rt pancoast tumor biopsy

## 2014-08-01 NOTE — Procedures (Signed)
Successful RUL MASS 18G CORE BXS NO COMP STABLE FULL REPORT IN PACS PATH PENDING

## 2014-08-06 ENCOUNTER — Other Ambulatory Visit (HOSPITAL_COMMUNITY): Payer: Self-pay | Admitting: Oncology

## 2014-08-06 DIAGNOSIS — R918 Other nonspecific abnormal finding of lung field: Secondary | ICD-10-CM

## 2014-08-06 MED ORDER — HYDROCODONE-ACETAMINOPHEN 10-325 MG PO TABS: 1.0000 | ORAL_TABLET | Freq: Four times a day (QID) | ORAL | Status: DC | PRN

## 2014-08-08 ENCOUNTER — Ambulatory Visit (HOSPITAL_COMMUNITY)
Admission: RE | Admit: 2014-08-08 | Discharge: 2014-08-08 | Disposition: A | Discharge status: Home / Self Care | Payer: PRIVATE HEALTH INSURANCE | Source: Ambulatory Visit | Attending: Oncology | Admitting: Oncology

## 2014-08-08 ENCOUNTER — Encounter (HOSPITAL_COMMUNITY): Payer: Self-pay

## 2014-08-08 DIAGNOSIS — C7970 Secondary malignant neoplasm of unspecified adrenal gland: Secondary | ICD-10-CM | POA: Diagnosis not present

## 2014-08-08 DIAGNOSIS — R918 Other nonspecific abnormal finding of lung field: Secondary | ICD-10-CM

## 2014-08-08 DIAGNOSIS — R222 Localized swelling, mass and lump, trunk: Secondary | ICD-10-CM | POA: Diagnosis present

## 2014-08-08 DIAGNOSIS — E279 Disorder of adrenal gland, unspecified: Secondary | ICD-10-CM | POA: Diagnosis present

## 2014-08-08 DIAGNOSIS — C771 Secondary and unspecified malignant neoplasm of intrathoracic lymph nodes: Secondary | ICD-10-CM | POA: Diagnosis not present

## 2014-08-08 DIAGNOSIS — E278 Other specified disorders of adrenal gland: Secondary | ICD-10-CM

## 2014-08-08 DIAGNOSIS — C3490 Malignant neoplasm of unspecified part of unspecified bronchus or lung: Secondary | ICD-10-CM | POA: Diagnosis not present

## 2014-08-08 LAB — GLUCOSE, CAPILLARY: GLUCOSE-CAPILLARY: 105 mg/dL — AB (ref 70–99)

## 2014-08-08 MED ORDER — FLUDEOXYGLUCOSE F - 18 (FDG) INJECTION: 5.1700 | Freq: Once | INTRAVENOUS | Status: AC | PRN

## 2014-08-14 ENCOUNTER — Encounter (HOSPITAL_COMMUNITY): Payer: Self-pay

## 2014-08-14 ENCOUNTER — Other Ambulatory Visit (HOSPITAL_COMMUNITY): Payer: Self-pay | Admitting: Hematology and Oncology

## 2014-08-14 ENCOUNTER — Encounter (HOSPITAL_COMMUNITY): Payer: PRIVATE HEALTH INSURANCE | Attending: Hematology and Oncology

## 2014-08-14 VITALS — BP 111/82 | HR 75 | Temp 98.8°F | Resp 18 | Ht 59.0 in | Wt 94.8 lb

## 2014-08-14 DIAGNOSIS — R918 Other nonspecific abnormal finding of lung field: Secondary | ICD-10-CM

## 2014-08-14 DIAGNOSIS — Z79899 Other long term (current) drug therapy: Secondary | ICD-10-CM | POA: Diagnosis not present

## 2014-08-14 DIAGNOSIS — C3410 Malignant neoplasm of upper lobe, unspecified bronchus or lung: Secondary | ICD-10-CM | POA: Diagnosis not present

## 2014-08-14 DIAGNOSIS — J4489 Other specified chronic obstructive pulmonary disease: Secondary | ICD-10-CM | POA: Insufficient documentation

## 2014-08-14 DIAGNOSIS — I10 Essential (primary) hypertension: Secondary | ICD-10-CM | POA: Insufficient documentation

## 2014-08-14 DIAGNOSIS — J449 Chronic obstructive pulmonary disease, unspecified: Secondary | ICD-10-CM | POA: Diagnosis not present

## 2014-08-14 DIAGNOSIS — C3411 Malignant neoplasm of upper lobe, right bronchus or lung: Secondary | ICD-10-CM

## 2014-08-14 DIAGNOSIS — C781 Secondary malignant neoplasm of mediastinum: Secondary | ICD-10-CM | POA: Diagnosis not present

## 2014-08-14 DIAGNOSIS — F17200 Nicotine dependence, unspecified, uncomplicated: Secondary | ICD-10-CM | POA: Diagnosis not present

## 2014-08-14 DIAGNOSIS — M5412 Radiculopathy, cervical region: Secondary | ICD-10-CM

## 2014-08-14 DIAGNOSIS — C50919 Malignant neoplasm of unspecified site of unspecified female breast: Secondary | ICD-10-CM | POA: Diagnosis not present

## 2014-08-14 DIAGNOSIS — Z9089 Acquired absence of other organs: Secondary | ICD-10-CM | POA: Diagnosis not present

## 2014-08-14 DIAGNOSIS — K219 Gastro-esophageal reflux disease without esophagitis: Secondary | ICD-10-CM | POA: Diagnosis not present

## 2014-08-14 DIAGNOSIS — C772 Secondary and unspecified malignant neoplasm of intra-abdominal lymph nodes: Secondary | ICD-10-CM | POA: Insufficient documentation

## 2014-08-14 DIAGNOSIS — E785 Hyperlipidemia, unspecified: Secondary | ICD-10-CM | POA: Insufficient documentation

## 2014-08-14 DIAGNOSIS — C779 Secondary and unspecified malignant neoplasm of lymph node, unspecified: Secondary | ICD-10-CM

## 2014-08-14 DIAGNOSIS — C7970 Secondary malignant neoplasm of unspecified adrenal gland: Secondary | ICD-10-CM | POA: Diagnosis not present

## 2014-08-14 LAB — CBC WITH DIFFERENTIAL/PLATELET
Basophils Absolute: 0 10*3/uL (ref 0.0–0.1)
Basophils Relative: 0 % (ref 0–1)
Eosinophils Absolute: 0.2 10*3/uL (ref 0.0–0.7)
Eosinophils Relative: 2 % (ref 0–5)
HEMATOCRIT: 34.1 % — AB (ref 36.0–46.0)
Hemoglobin: 11.3 g/dL — ABNORMAL LOW (ref 12.0–15.0)
Lymphocytes Relative: 32 % (ref 12–46)
Lymphs Abs: 3.5 10*3/uL (ref 0.7–4.0)
MCH: 29.7 pg (ref 26.0–34.0)
MCHC: 33.1 g/dL (ref 30.0–36.0)
MCV: 89.7 fL (ref 78.0–100.0)
Monocytes Absolute: 0.9 10*3/uL (ref 0.1–1.0)
Monocytes Relative: 9 % (ref 3–12)
NEUTROS ABS: 6.3 10*3/uL (ref 1.7–7.7)
NEUTROS PCT: 57 % (ref 43–77)
Platelets: 473 10*3/uL — ABNORMAL HIGH (ref 150–400)
RBC: 3.8 MIL/uL — ABNORMAL LOW (ref 3.87–5.11)
RDW: 16.6 % — AB (ref 11.5–15.5)
WBC: 11 10*3/uL — ABNORMAL HIGH (ref 4.0–10.5)

## 2014-08-14 LAB — COMPREHENSIVE METABOLIC PANEL
ALBUMIN: 3.3 g/dL — AB (ref 3.5–5.2)
ALK PHOS: 105 U/L (ref 39–117)
ALT: 6 U/L (ref 0–35)
AST: 16 U/L (ref 0–37)
Anion gap: 13 (ref 5–15)
BUN: 7 mg/dL (ref 6–23)
CHLORIDE: 99 meq/L (ref 96–112)
CO2: 25 meq/L (ref 19–32)
Calcium: 10 mg/dL (ref 8.4–10.5)
Creatinine, Ser: 0.61 mg/dL (ref 0.50–1.10)
GFR calc Af Amer: >90 mL/min (ref >90)
Glucose, Bld: 113 mg/dL — ABNORMAL HIGH (ref 70–99)
POTASSIUM: 3.7 meq/L (ref 3.7–5.3)
Sodium: 137 mEq/L (ref 137–147)
Total Bilirubin: 0.2 mg/dL — ABNORMAL LOW (ref 0.3–1.2)
Total Protein: 8.8 g/dL — ABNORMAL HIGH (ref 6.0–8.3)

## 2014-08-14 LAB — LACTATE DEHYDROGENASE: LDH: 199 U/L (ref 94–250)

## 2014-08-14 MED ORDER — FOLIC ACID 1 MG PO TABS: 1.0000 mg | ORAL_TABLET | Freq: Every day | ORAL | Status: DC

## 2014-08-14 MED ORDER — FENTANYL 25 MCG/HR TD PT72: 25.0000 ug | MEDICATED_PATCH | TRANSDERMAL | Status: DC

## 2014-08-14 MED ORDER — OXYCODONE HCL 5 MG PO TABS: ORAL_TABLET | ORAL | Status: DC

## 2014-08-14 NOTE — Patient Instructions (Signed)
..[facility] Discharge Instructions  RECOMMENDATIONS MADE BY THE CONSULTANT AND ANY TEST RESULTS WILL BE SENT TO YOUR REFERRING PHYSICIAN.  EXAM FINDINGS BY THE PHYSICIAN TODAY AND SIGNS OR SYMPTOMS TO REPORT TO CLINIC OR PRIMARY PHYSICIAN: Exam and findings as discussed by Dr. Barnet Glasgow.  MEDICATIONS PRESCRIBED:  Fentanyl patch- change every 3 days Oxycodone as needed for pain not relieved by the patch Folic acid daily-prescription at your drugstore Take 2 senna k tablets to prevent constipation   INSTRUCTIONS/FOLLOW-UP: We will be setting you up for port a cath placement by the radiology department at [facility] long We will make you an appointment with the Nurse Navigator who will teach you about chemo treatments and what to expect We will plan on treating you in about 3 weeks  Thank you for choosing [facility] to provide your oncology and hematology care.  To afford each patient quality time with our providers, please arrive at least 15 minutes before your scheduled appointment time.  With your help, our goal is to use those 15 minutes to complete the necessary work-up to ensure our physicians have the information they need to help with your evaluation and healthcare recommendations.    Effective January 1st, 2014, we ask that you re-schedule your appointment with our physicians should you arrive 10 or more minutes late for your appointment.  We strive to give you quality time with our providers, and arriving late affects you and other patients whose appointments are after yours.    Again, thank you for choosing [facility].  Our hope is that these requests will decrease the amount of time that you wait before being seen by our physicians.       _____________________________________________________________  Should you have questions after your visit to [facility], please contact our office at [phone] between the hours of  8:30 a.m. and 4:30 p.m.  Voicemails left after 4:30 p.m. will not be returned until the following business day.  For prescription refill requests, have your pharmacy contact our office with your prescription refill request.    _______________________________________________________________  We hope that we have given you very good care.  You may receive a patient satisfaction survey in the mail, please complete it and return it as soon as possible.  We value your feedback!  _______________________________________________________________  Have you asked about our STAR program?  STAR stands for Survivorship Training and Rehabilitation, and this is a nationally recognized cancer care program that focuses on survivorship and rehabilitation.  Cancer and cancer treatments may cause problems, such as, pain, making you feel tired and keeping you from doing the things that you need or want to do. Cancer rehabilitation can help. Our goal is to reduce these troubling effects and help you have the best quality of life possible.  You may receive a survey from a nurse that asks questions about your current state of health.  Based on the survey results, all eligible patients will be referred to the [facility] program for an evaluation so we can better serve you!  A frequently asked questions sheet is available upon request. Cisplatin injection What is this medicine? CISPLATIN (SIS pla tin) is a chemotherapy drug. It targets fast dividing cells, like cancer cells, and causes these cells to die. This medicine is used to treat many types of cancer like bladder, ovarian, and testicular cancers. This medicine may be used for other purposes; ask your health care provider or pharmacist if you  have questions. COMMON BRAND NAME(S): Platinol, Platinol -AQ What should I tell my health care provider before I take this medicine? They need to know if you have any of these conditions: -blood disorders -hearing problems -kidney  disease -recent or ongoing radiation therapy -an unusual or allergic reaction to cisplatin, carboplatin, other chemotherapy, other medicines, foods, dyes, or preservatives -pregnant or trying to get pregnant -breast-feeding How should I use this medicine? This drug is given as an infusion into a vein. It is administered in a hospital or clinic by a specially trained health care professional. Talk to your pediatrician regarding the use of this medicine in children. Special care may be needed. Overdosage: If you think you have taken too much of this medicine contact a poison control center or emergency room at once. NOTE: This medicine is only for you. Do not share this medicine with others. What if I miss a dose? It is important not to miss a dose. Call your doctor or health care professional if you are unable to keep an appointment. What may interact with this medicine? -dofetilide -foscarnet -medicines for seizures -medicines to increase blood counts like filgrastim, pegfilgrastim, sargramostim -probenecid -pyridoxine used with altretamine -rituximab -some antibiotics like amikacin, gentamicin, neomycin, polymyxin B, streptomycin, tobramycin -sulfinpyrazone -vaccines -zalcitabine Talk to your doctor or health care professional before taking any of these medicines: -acetaminophen -aspirin -ibuprofen -ketoprofen -naproxen This list may not describe all possible interactions. Give your health care provider a list of all the medicines, herbs, non-prescription drugs, or dietary supplements you use. Also tell them if you smoke, drink alcohol, or use illegal drugs. Some items may interact with your medicine. What should I watch for while using this medicine? Your condition will be monitored carefully while you are receiving this medicine. You will need important blood work done while you are taking this medicine. This drug may make you feel generally unwell. This is not uncommon, as  chemotherapy can affect healthy cells as well as cancer cells. Report any side effects. Continue your course of treatment even though you feel ill unless your doctor tells you to stop. In some cases, you may be given additional medicines to help with side effects. Follow all directions for their use. Call your doctor or health care professional for advice if you get a fever, chills or sore throat, or other symptoms of a cold or flu. Do not treat yourself. This drug decreases your body's ability to fight infections. Try to avoid being around people who are sick. This medicine may increase your risk to bruise or bleed. Call your doctor or health care professional if you notice any unusual bleeding. Be careful brushing and flossing your teeth or using a toothpick because you may get an infection or bleed more easily. If you have any dental work done, tell your dentist you are receiving this medicine. Avoid taking products that contain aspirin, acetaminophen, ibuprofen, naproxen, or ketoprofen unless instructed by your doctor. These medicines may hide a fever. Do not become pregnant while taking this medicine. Women should inform their doctor if they wish to become pregnant or think they might be pregnant. There is a potential for serious side effects to an unborn child. Talk to your health care professional or pharmacist for more information. Do not breast-feed an infant while taking this medicine. Drink fluids as directed while you are taking this medicine. This will help protect your kidneys. Call your doctor or health care professional if you get diarrhea. Do not treat yourself.  What side effects may I notice from receiving this medicine? Side effects that you should report to your doctor or health care professional as soon as possible: -allergic reactions like skin rash, itching or hives, swelling of the face, lips, or tongue -signs of infection - fever or chills, cough, sore throat, pain or difficulty  passing urine -signs of decreased platelets or bleeding - bruising, pinpoint red spots on the skin, black, tarry stools, nosebleeds -signs of decreased red blood cells - unusually weak or tired, fainting spells, lightheadedness -breathing problems -changes in hearing -gout pain -low blood counts - This drug may decrease the number of white blood cells, red blood cells and platelets. You may be at increased risk for infections and bleeding. -nausea and vomiting -pain, swelling, redness or irritation at the injection site -pain, tingling, numbness in the hands or feet -problems with balance, movement -trouble passing urine or change in the amount of urine Side effects that usually do not require medical attention (report to your doctor or health care professional if they continue or are bothersome): -changes in vision -loss of appetite -metallic taste in the mouth or changes in taste This list may not describe all possible side effects. Call your doctor for medical advice about side effects. You may report side effects to FDA at 1-800-FDA-1088. Where should I keep my medicine? This drug is given in a hospital or clinic and will not be stored at home. NOTE: This sheet is a summary. It may not cover all possible information. If you have questions about this medicine, talk to your doctor, pharmacist, or health care provider.  2015, Elsevier/Gold Standard. (2008-02-12 14:40:54) Pemetrexed injection What is this medicine? PEMETREXED (PEM e TREX ed) is a chemotherapy drug. This medicine affects cells that are rapidly growing, such as cancer cells and cells in your mouth and stomach. It is usually used to treat lung cancers like non-small cell lung cancer and mesothelioma. It may also be used to treat other cancers. This medicine may be used for other purposes; ask your health care provider or pharmacist if you have questions. COMMON BRAND NAME(S): Alimta What should I tell my health care provider  before I take this medicine? They need to know if you have any of these conditions: -if you frequently drink alcohol containing beverages -infection (especially a virus infection such as chickenpox, cold sores, or herpes) -kidney disease -liver disease -low blood counts, like low platelets, red bloods, or white blood cells -an unusual or allergic reaction to pemetrexed, mannitol, other medicines, foods, dyes, or preservatives -pregnant or trying to get pregnant -breast-feeding How should I use this medicine? This drug is given as an infusion into a vein. It is administered in a hospital or clinic by a specially trained health care professional. Talk to your pediatrician regarding the use of this medicine in children. Special care may be needed. Overdosage: If you think you have taken too much of this medicine contact a poison control center or emergency room at once. NOTE: This medicine is only for you. Do not share this medicine with others. What if I miss a dose? It is important not to miss your dose. Call your doctor or health care professional if you are unable to keep an appointment. What may interact with this medicine? -aspirin and aspirin-like medicines -medicines to increase blood counts like filgrastim, pegfilgrastim, sargramostim -methotrexate -NSAIDS, medicines for pain and inflammation, like ibuprofen or naproxen -probenecid -pyrimethamine -vaccines Talk to your doctor or health care professional before taking  any of these medicines: -acetaminophen -aspirin -ibuprofen -ketoprofen -naproxen This list may not describe all possible interactions. Give your health care provider a list of all the medicines, herbs, non-prescription drugs, or dietary supplements you use. Also tell them if you smoke, drink alcohol, or use illegal drugs. Some items may interact with your medicine. What should I watch for while using this medicine? Visit your doctor for checks on your progress.  This drug may make you feel generally unwell. This is not uncommon, as chemotherapy can affect healthy cells as well as cancer cells. Report any side effects. Continue your course of treatment even though you feel ill unless your doctor tells you to stop. In some cases, you may be given additional medicines to help with side effects. Follow all directions for their use. Call your doctor or health care professional for advice if you get a fever, chills or sore throat, or other symptoms of a cold or flu. Do not treat yourself. This drug decreases your body's ability to fight infections. Try to avoid being around people who are sick. This medicine may increase your risk to bruise or bleed. Call your doctor or health care professional if you notice any unusual bleeding. Be careful brushing and flossing your teeth or using a toothpick because you may get an infection or bleed more easily. If you have any dental work done, tell your dentist you are receiving this medicine. Avoid taking products that contain aspirin, acetaminophen, ibuprofen, naproxen, or ketoprofen unless instructed by your doctor. These medicines may hide a fever. Call your doctor or health care professional if you get diarrhea or mouth sores. Do not treat yourself. To protect your kidneys, drink water or other fluids as directed while you are taking this medicine. Men and women must use effective birth control while taking this medicine. You may also need to continue using effective birth control for a time after stopping this medicine. Do not become pregnant while taking this medicine. Tell your doctor right away if you think that you or your partner might be pregnant. There is a potential for serious side effects to an unborn child. Talk to your health care professional or pharmacist for more information. Do not breast-feed an infant while taking this medicine. This medicine may lower sperm counts. What side effects may I notice from  receiving this medicine? Side effects that you should report to your doctor or health care professional as soon as possible: -allergic reactions like skin rash, itching or hives, swelling of the face, lips, or tongue -low blood counts - this medicine may decrease the number of white blood cells, red blood cells and platelets. You may be at increased risk for infections and bleeding. -signs of infection - fever or chills, cough, sore throat, pain or difficulty passing urine -signs of decreased platelets or bleeding - bruising, pinpoint red spots on the skin, black, tarry stools, blood in the urine -signs of decreased red blood cells - unusually weak or tired, fainting spells, lightheadedness -breathing problems, like a dry cough -changes in emotions or moods -chest pain -confusion -diarrhea -high blood pressure -mouth or throat sores or ulcers -pain, swelling, warmth in the leg -pain on swallowing -swelling of the ankles, feet, hands -trouble passing urine or change in the amount of urine -vomiting -yellowing of the eyes or skin Side effects that usually do not require medical attention (report to your doctor or health care professional if they continue or are bothersome): -hair loss -loss of appetite -nausea -stomach  upset This list may not describe all possible side effects. Call your doctor for medical advice about side effects. You may report side effects to FDA at 1-800-FDA-1088. Where should I keep my medicine? This drug is given in a hospital or clinic and will not be stored at home. NOTE: This sheet is a summary. It may not cover all possible information. If you have questions about this medicine, talk to your doctor, pharmacist, or health care provider.  2015, Elsevier/Gold Standard. (2008-06-10 13:24:03) Bevacizumab injection What is this medicine? BEVACIZUMAB (be va SIZ yoo mab) is a chemotherapy drug. It targets a protein found in many cancer cell types, and halts cancer  growth. This drug treats many cancers including non-small cell lung cancer, ovarian cancer, cervical cancer, and colon or rectal cancer. It is usually given with other chemotherapy drugs. This medicine may be used for other purposes; ask your health care provider or pharmacist if you have questions. COMMON BRAND NAME(S): Avastin What should I tell my health care provider before I take this medicine? They need to know if you have any of these conditions: -blood clots -heart disease, including heart failure, heart attack, or chest pain (angina) -high blood pressure -infection (especially a virus infection such as chickenpox, cold sores, or herpes) -kidney disease -lung disease -prior chemotherapy with doxorubicin, daunorubicin, epirubicin, or other anthracycline type chemotherapy agents -recent or ongoing radiation therapy -recent surgery -stroke -an unusual or allergic reaction to bevacizumab, hamster proteins, mouse proteins, other medicines, foods, dyes, or preservatives -pregnant or trying to get pregnant -breast-feeding How should I use this medicine? This medicine is for infusion into a vein. It is given by a health care professional in a hospital or clinic setting. Talk to your pediatrician regarding the use of this medicine in children. Special care may be needed. Overdosage: If you think you have taken too much of this medicine contact a poison control center or emergency room at once. NOTE: This medicine is only for you. Do not share this medicine with others. What if I miss a dose? It is important not to miss your dose. Call your doctor or health care professional if you are unable to keep an appointment. What may interact with this medicine? Interactions are not expected. This list may not describe all possible interactions. Give your health care provider a list of all the medicines, herbs, non-prescription drugs, or dietary supplements you use. Also tell them if you smoke, drink  alcohol, or use illegal drugs. Some items may interact with your medicine. What should I watch for while using this medicine? Your condition will be monitored carefully while you are receiving this medicine. You will need important blood work and urine testing done while you are taking this medicine. During your treatment, let your health care professional know if you have any unusual symptoms, such as difficulty breathing. This medicine may rarely cause 'gastrointestinal perforation' (holes in the stomach, intestines or colon), a serious side effect requiring surgery to repair. This medicine should be started at least 28 days following major surgery and the site of the surgery should be totally healed. Check with your doctor before scheduling dental work or surgery while you are receiving this treatment. Talk to your doctor if you have recently had surgery or if you have a wound that has not healed. Do not become pregnant while taking this medicine. Women should inform their doctor if they wish to become pregnant or think they might be pregnant. There is a potential for serious  side effects to an unborn child. Talk to your health care professional or pharmacist for more information. Do not breast-feed an infant while taking this medicine. This medicine has caused ovarian failure in some women. This medicine may interfere with the ability to have a child. You should talk to your doctor or health care professional if you are concerned about your fertility. What side effects may I notice from receiving this medicine? Side effects that you should report to your doctor or health care professional as soon as possible: -allergic reactions like skin rash, itching or hives, swelling of the face, lips, or tongue -signs of infection - fever or chills, cough, sore throat, pain or trouble passing urine -signs of decreased platelets or bleeding - bruising, pinpoint red spots on the skin, black, tarry stools,  nosebleeds, blood in the urine -breathing problems -changes in vision -chest pain -confusion -jaw pain, especially after dental work -mouth sores -seizures -severe abdominal pain -severe headache -sudden numbness or weakness of the face, arm or leg -swelling of legs or ankles -symptoms of a stroke: change in mental awareness, inability to talk or move one side of the body (especially in patients with lung cancer) -trouble passing urine or change in the amount of urine -trouble speaking or understanding -trouble walking, dizziness, loss of balance or coordination Side effects that usually do not require medical attention (report to your doctor or health care professional if they continue or are bothersome): -constipation -diarrhea -dry skin -headache -loss of appetite -nausea, vomiting This list may not describe all possible side effects. Call your doctor for medical advice about side effects. You may report side effects to FDA at 1-800-FDA-1088. Where should I keep my medicine? This drug is given in a hospital or clinic and will not be stored at home. NOTE: This sheet is a summary. It may not cover all possible information. If you have questions about this medicine, talk to your doctor, pharmacist, or health care provider.  2015, Elsevier/Gold Standard. (2013-10-08 11:38:34)

## 2014-08-14 NOTE — Progress Notes (Signed)
..  STAR Program Physical Impairment and Functional Assessment Screening Tool  1. Are you having any pain, including headaches, joint pain, or muscle pain (upper body = OT; lower body = PT)?  Yes, this started after my diagnosis and is still a problem.  2. Do your hands and/or feet feel numb or tingle (PT)?  No  3. Does any part of your body feel swollen or larger than usual (upper body = OT; lower body = PT)?  No  4. Are you so tired that you cannot do the things you want or need to do (PT or OT)?  No  5. Are you feeling weak or are you having trouble moving any part of your body (PT/OT)?  No  6. Are you having trouble concentrating, thinking, or remembering things (OT/ST)?  No  7. Are you having trouble moving around or feel like you might trip or fall (PT)?  No  8. Are you having trouble swallowing (ST)?  No  9. Are you having trouble speaking (ST)?  No  10. Are you having trouble with going or getting to the bathroom (OT)?  No  11. Are you having trouble with your sexual function (OT)?  No  12. Are you having trouble lifting things, even just your arms (OT/PT)?  No  13. Are you having trouble taking care of yourself as in dressing or bathing (OT)?  No  14. Are you having trouble with daily tasks like chores or shopping (OT)?  No  15. Are you having trouble driving (OT)?  No  16. Are you having trouble returning to work or completing your tasks at work (OT)?  No  Other concerns:    Legend: OT = Occupational Therapy PT = Physical Therapy ST = Speech Therapy

## 2014-08-14 NOTE — Progress Notes (Signed)
[place] A. Barnet Glasgow, M.D.  NEW PATIENT EVALUATION   Name: Melissa Sullivan Date: 08/14/2014 MRN: [id number] DOB: 1956/06/16  PCP: Chip Boer, PA-C   REFERRING PHYSICIAN: [name], [name], PA-C  REASON FOR REFERRAL: Right apical lung mass, status post biopsy on 08/06/2014 here today to discuss diagnosis and management.     HISTORY OF PRESENT ILLNESS:Melissa Sullivan is a 58 y.o. female who is referred by her family physician for evaluation of a right apical lung mass, status post biopsy and PET scan staging. For the past year the patient has had pain in the right shoulder and was referred for physical therapy. No improvement occurred . In fact pain became worse. She has never had an x-ray done. She eventually went to the emergency room and [facility] complaining of right back and arm pain at which time a right shoulder x-ray was done followed by CT scan of the chest revealing a large right upper lobe mass. She was referred for percutaneous biopsy and PET scan and is being seen today for evaluation and management strategy. She takes about 6 hydrocodone tablets per 24 hours for relief. Really physical he transiently and never to a level below 5/10. Appetite been good with no dysphagia or odynophagia. She denies any headache, focal weakness, but has had tingling and numbness on the inner surface of the right upper extremity. She denies abdominal pain or lower back pain and has not had lower extremity swelling, fever, night sweats, headache, or seizure activity. She does smoke a half pack of cigarettes daily. She works in the [employer].   PAST MEDICAL HISTORY:  has a past medical history of Hypertension; Reflux; Hyperlipidemia; Lung mass (07/28/2014); and Adrenal mass, right (07/28/2014).     PAST SURGICAL HISTORY: Past Surgical History  Procedure Laterality Date  . Appendectomy    . Lung biopsy Right 07/2014    CT guided      CURRENT MEDICATIONS: has a current medication list which includes the following prescription(s): hydrochlorothiazide, hydrocodone-acetaminophen, multivitamin with minerals, omeprazole, potassium chloride er, simvastatin, fentanyl, folic acid, and oxycodone.   ALLERGIES: Review of patient's allergies indicates no known allergies.   SOCIAL HISTORY:  reports that she has been smoking Cigarettes.  She has been smoking about 0.30 packs per day. She does not have any smokeless tobacco history on file. She reports that she does not drink alcohol or use illicit drugs.   FAMILY HISTORY: family history includes Cancer in her sister.    REVIEW OF SYSTEMS:  Other than that discussed above is noncontributory.    PHYSICAL EXAM:  height is 4' 11"  (1.499 m) and weight is 94 lb 12.8 oz (43.001 kg). Her temperature is 98.8 F (37.1 C). Her blood pressure is 111/82 and her pulse is 75. Her respiration is 18.    GENERAL:alert, no distress and comfortable SKIN: skin color, texture, turgor are normal, no rashes or significant lesions EYES: normal, Conjunctiva are pink and non-injected, sclera clear OROPHARYNX:no exudate, no erythema and lips, buccal mucosa, and tongue normal  NECK: supple, thyroid normal size, non-tender, without nodularity CHEST: Increased AP diameter with no breast masses. LYMPH:  no palpable lymphadenopathy in the cervical, axillary or inguinal LUNGS: clear to auscultation and percussion with normal breathing effort HEART: regular rate & rhythm and no murmurs ABDOMEN:abdomen soft, non-tender and normal bowel sounds MUSCULOSKELETALl:no cyanosis of digits, no clubbing or edema . Decreased range of motion of the right  shoulder with pain on abduction. NEURO: alert & oriented x 3 with fluent speech, no focal motor/sensory deficits    LABORATORY DATA:  Hospital Outpatient Visit on 08/08/2014  Component Date Value Ref Range Status  . Glucose-Capillary 08/08/2014 105* 70 - 99 mg/dL  Final  Hospital Outpatient Visit on 08/01/2014  Component Date Value Ref Range Status  . aPTT 08/01/2014 31  24 - 37 seconds Final  . WBC 08/01/2014 11.2* 4.0 - 10.5 K/uL Final  . RBC 08/01/2014 3.79* 3.87 - 5.11 MIL/uL Final  . Hemoglobin 08/01/2014 11.2* 12.0 - 15.0 g/dL Final  . HCT 08/01/2014 33.4* 36.0 - 46.0 % Final  . MCV 08/01/2014 88.1  78.0 - 100.0 fL Final  . MCH 08/01/2014 29.6  26.0 - 34.0 pg Final  . MCHC 08/01/2014 33.5  30.0 - 36.0 g/dL Final  . RDW 08/01/2014 16.2* 11.5 - 15.5 % Final  . Platelets 08/01/2014 376  150 - 400 K/uL Final  . Prothrombin Time 08/01/2014 14.0  11.6 - 15.2 seconds Final  . INR 08/01/2014 1.08  0.00 - 1.49 Final  Admission on 07/28/2014, Discharged on 07/28/2014  Component Date Value Ref Range Status  . WBC 07/28/2014 14.5* 4.0 - 10.5 K/uL Final  . RBC 07/28/2014 3.97  3.87 - 5.11 MIL/uL Final  . Hemoglobin 07/28/2014 11.8* 12.0 - 15.0 g/dL Final  . HCT 07/28/2014 34.8* 36.0 - 46.0 % Final  . MCV 07/28/2014 87.7  78.0 - 100.0 fL Final  . MCH 07/28/2014 29.7  26.0 - 34.0 pg Final  . MCHC 07/28/2014 33.9  30.0 - 36.0 g/dL Final  . RDW 07/28/2014 15.5  11.5 - 15.5 % Final  . Platelets 07/28/2014 413* 150 - 400 K/uL Final  . Neutrophils Relative % 07/28/2014 68  43 - 77 % Final  . Neutro Abs 07/28/2014 9.9* 1.7 - 7.7 K/uL Final  . Lymphocytes Relative 07/28/2014 26  12 - 46 % Final  . Lymphs Abs 07/28/2014 3.7  0.7 - 4.0 K/uL Final  . Monocytes Relative 07/28/2014 5  3 - 12 % Final  . Monocytes Absolute 07/28/2014 0.7  0.1 - 1.0 K/uL Final  . Eosinophils Relative 07/28/2014 1  0 - 5 % Final  . Eosinophils Absolute 07/28/2014 0.2  0.0 - 0.7 K/uL Final  . Basophils Relative 07/28/2014 0  0 - 1 % Final  . Basophils Absolute 07/28/2014 0.0  0.0 - 0.1 K/uL Final  . Sodium 07/28/2014 140  137 - 147 mEq/L Final  . Potassium 07/28/2014 2.8* 3.7 - 5.3 mEq/L Final   Comment: CRITICAL RESULT CALLED TO, READ BACK BY AND VERIFIED WITH:                           FLOREZ,L AT 1:24PM ON 07/28/14 BY FESTERMAN,C  . Chloride 07/28/2014 96  96 - 112 mEq/L Final  . CO2 07/28/2014 26  19 - 32 mEq/L Final  . Glucose, Bld 07/28/2014 81  70 - 99 mg/dL Final  . BUN 07/28/2014 11  6 - 23 mg/dL Final  . Creatinine, Ser 07/28/2014 0.72  0.50 - 1.10 mg/dL Final  . Calcium 07/28/2014 9.9  8.4 - 10.5 mg/dL Final  . GFR calc non Af Amer 07/28/2014 >90  >90 mL/min Final  . GFR calc Af Amer 07/28/2014 >90  >90 mL/min Final   Comment: (NOTE)  The eGFR has been calculated using the CKD EPI equation.                          This calculation has not been validated in all clinical situations.                          eGFR's persistently <90 mL/min signify possible Chronic Kidney                          Disease.  . Anion gap 07/28/2014 18* 5 - 15 Final    Urinalysis No results found for this basename: colorurine,  appearanceur,  labspec,  phurine,  glucoseu,  hgbur,  bilirubinur,  ketonesur,  proteinur,  urobilinogen,  nitrite,  leukocytesur      @RADIOGRAPHY : Dg Shoulder Right  07/28/2014   CLINICAL DATA:  Right shoulder pain for 1 month.  No injury.  EXAM: RIGHT SHOULDER - 2+ VIEW  COMPARISON:  None.  FINDINGS: Abnormal thick density at the right lung apex, with apparent erosion/destruction of the right second rib. Appearance highly concerning for Pancoast tumor, possibly with associated right upper lobe atelectasis. Chest CT with contrast recommended for further workup.  IMPRESSION: 1. Suspected right apical malignancy with destruction of the right second rib. Possible right upper lobe atelectasis is associated. CT chest with contrast recommended. These results were called by telephone at the time of interpretation on 07/28/2014 at 12:26 pm to the [facility] ED physician, who verbally acknowledged these results.   Electronically Signed   By: Sherryl Barters M.D.   On: 07/28/2014 12:29   Ct Chest W Contrast  07/28/2014   CLINICAL DATA:   58 year old female presenting with right shoulder pain. Abnormal plain film exam. Hypertension. Hyperlipidemia.  EXAM: CT CHEST WITH CONTRAST  TECHNIQUE: Multidetector CT imaging of the chest was performed during intravenous contrast administration.  CONTRAST:  79m OMNIPAQUE IOHEXOL 300 MG/ML  SOLN  COMPARISON:  07/28/2014 plain film exam of the right shoulder. No comparison CT.  FINDINGS: 8.9 x 8.1 x 6.8 cm right apical destructive mass consistent with malignancy (Pancoast tumor). This destroys the posterior aspect of the right second rib with extension into adjacent soft tissues posterior to the destroyed rib and supraclavicular region. Additionally, tumor extends partially into the right T2-3 and T3-4 neural foramen. This mass extends into the mediastinum displacing the esophagus to left and compresses the posterior aspect of the trachea. Invasion of the structures cannot be excluded.  Right hilar adenopathy with transverse short axis dimension 1.3 cm.  5 cm right adrenal mass most consistent with metastatic disease. This impresses upon the right kidney, liver, inferior vena cava (which is narrowed) and surrounds the right renal artery right and right renal vein which are significantly narrowed. This metastatic lesion may extend through the right crus of the diaphragm.  Parenchymal changes medial aspect of the left upper lobe may represent inflammatory process rather than spread tumor (which cannot be entirely excluded).  Exam was not performed with pulmonary embolus technique however, no obvious pulmonary embolus detected.  Heart size within normal limits.  IMPRESSION: Large right apical mass consistent with malignancy. This is destroying the right second rib with extension into adjacent soft tissue (posterior to the ribs and right supraclavicular region). There is a suggestion of extension tumor partially into the right T2-3 in T3-4 neural foramen. Suggestion of minimal erosion of these vertebra.  This mass  extends into the mediastinum displacing the trachea and esophagus and at points is inseparable from the structures.  Right hilar adenopathy.  Right adrenal 5 cm mass consistent with metastatic disease causing compression of surrounding structures including narrowing of the inferior vena cava and significant narrowing of the right renal artery and vein.  Parenchymal changes medial aspect left upper lobe may represent result of inflammatory process versus tumor.  These results were called by telephone at the time of interpretation on 07/28/2014 at 2:15 pm to Dr. Francine Graven , who verbally acknowledged these results.   Electronically Signed   By: Chauncey Cruel M.D.   On: 07/28/2014 14:16   Nm Pet Image Initial (pi) Skull Base To Thigh  08/08/2014   CLINICAL DATA:  Initial treatment strategy for lung mass and right adrenal mass.  EXAM: NUCLEAR MEDICINE PET SKULL BASE TO THIGH  TECHNIQUE: 5.2 MCi F-18 FDG was injected intravenously. Full-ring PET imaging was performed from the skull base to thigh after the radiotracer. CT data was obtained and used for attenuation correction and anatomic localization.  FASTING BLOOD GLUCOSE:  Value: 108 mg/dl  COMPARISON:  Chest CT 07/28/2014.  FINDINGS: NECK  Circumferential soft tissue thickening in the region of the nasopharynx demonstrates hypermetabolism (SUVmax = 7.9). Left level IIa lymph node measuring 11 x 14 mm (image 27 of series 4) is hypermetabolic (SUVmax = 43.3). There is also a right level IIa lymph node measuring 9 x 14 mm (image 27 of series 4) which demonstrates low-level hypermetabolism (SUVmax = 3.1). Focus of hypermetabolism in the angle of the mandible on the left (SUVmax = 6.3)may relate to underlying dental disease.  CHEST  Again noted is a large mass in the apex of the right upper lobe, which currently measures 9.6 x 7.5 cm and is diffusely hypermetabolic (SUVmax = 46). This lesion directly invades the mediastinum and is causing mass effect upon the  adjacent proximal trachea which is slightly displaced and deformed to the left. There is also definitive evidence of chest wall invasion, predominantly related to gross bony destruction of the posterior aspect of the right second rib. Postobstructive changes are noted in the adjacent right upper lobe lung parenchyma. No other definite pulmonary nodules. There is a small amount of mild cylindrical bronchiectasis with peribronchovascular micronodularity in the anterior aspect of the left upper lobe, compatible with areas of mucoid impaction within terminal bronchioles. The only mediastinal adenopathy is some right retrocrural lymphadenopathy measuring up to 2.4 x 1.3 cm, which is hypermetabolic (SUVmax = 29.5). Trace right pleural fluid collection lying dependently. Mild to moderate centrilobular and paraseptal emphysema is noted.  ABDOMEN/PELVIS  Large hypermetabolic (SUVmax = 18.8)CZYSA adrenal mass is noted measuring approximately 6.6 x 4.2 cm (image 99 of series 4). There is a smaller left adrenal nodule as well (image 96 of series 4) which currently measures 1.9 x 1.3 cm which is hypermetabolic (SUVmax = 63.0). In addition, there is extensive amorphous retroperitoneal lymphadenopathy which is hypermetabolic (SUVmax = 16.0-10.9). No abnormal hypermetabolic activity within the liver, pancreas or spleen. Atherosclerosis throughout the abdominal and pelvic vasculature, without evidence of aneurysm. Numerous colonic diverticulae are noted, without surrounding inflammatory changes to suggest an acute diverticulitis at this time. Uterus and ovaries are unremarkable in appearance. Postoperative changes of right inguinal herniorrhaphy are noted.  SKELETON  Hypermetabolism in the lytic lesion in the posterior aspect of the right second rib, as well as the adjacent right T2 transverse process. No other definite skeletal lesions  are noted.  IMPRESSION: 1. Large hypermetabolic right apical mass with evidence of direct chest  wall and mediastinal invasion, right retrocrural lymphadenopathy, extensive retroperitoneal lymphadenopathy, and metastatic lesions to the adrenal glands bilaterally, including a 6.6 x 4.2 cm hypermetabolic right adrenal mass. Given the recent biopsy diagnosis of poorly differentiated adenocarcinoma from the right apical mass, findings are favored to reflect stage IV lung cancer. 2. However, there is also extensive soft tissue thickening in the posterior nasopharynx with bilateral level IIa lymphadenopathy in the neck. The possibility of a second primary neoplasm in this region warrants consideration. Clinical correlation is recommended. 3. There is also a small focus of hypermetabolism in the angle of the mandible on the left side, which is favored to be related to underlying dental disease, although a metastatic lesion in this region is difficult to exclude. 4. Additional findings, as above.   Electronically Signed   By: Vinnie Langton M.D.   On: 08/08/2014 11:12   Ct Biopsy  08/01/2014   CLINICAL DATA:  LARGE RIGHT UPPER LOBE MASS INVADING CHEST WALL AND SECOND POSTERIOR RIB  EXAM: CT GUIDED CORE BIOPSY OF RIGHT UPPER LOBE MASS  ANESTHESIA/SEDATION: 1.5  Mg IV Versed; 75 mcg IV Fentanyl  Total Moderate Sedation Time: 15 min minutes.  PROCEDURE: The procedure risks, benefits, and alternatives were explained to the patient. Questions regarding the procedure were encouraged and answered. The patient understands and consents to the procedure.  The right upper back was prepped with Betadinein a sterile fashion, and a sterile drape was applied covering the operative field. A sterile gown and sterile gloves were used for the procedure. Local anesthesia was provided with 1% Lidocaine.  Previous imaging reviewed. Patient positioned prone. Noncontrast localization CT performed. The large right upper lobe invasive mass was localized. Under sterile conditions and local anesthesia, a 17 gauge 6.8 cm access needle was  advanced posteriorly into the lesion at the level of the right second rib involvement. Needle position confirmed with CT. 18 gauge core biopsies obtained. Samples placed in formalin.  Complications: No immediate  FINDINGS: Imaging confirms needle placement in the right upper lobe mass  IMPRESSION: Successful CT-guided right upper lobe mass 18 gauge core biopsy   Electronically Signed   By: Daryll Brod M.D.   On: 08/01/2014 09:53    PATHOLOGY:  for JAYDYN, MENON ([id number]) Patient: DONALYN, SCHNEEBERGER Collected: 08/01/2014 Client:[facility] Accession: [id number] Received: 08/01/2014 Gasper Sells, MD DOB: Apr 10, 1956 Age: 75 Gender: F Reported: 08/06/2014 [address] Patient Ph: [phone] MRN #: [id number] [place], [place] [address] Visit #: [id number] Chart #: Phone: [phone] Fax: CC: Robynn Pane, PA-C REPORT OF SURGICAL PATHOLOGY FINAL DIAGNOSIS Diagnosis Lung, needle/core biopsy(ies), right upper lobe - POORLY DIFFERENTIATED ADENOCARCINOMA, SEE COMMENT. . Microscopic Comment The carcinoma demonstrates the following immunophenotype: TTF-1 - negative expression. CK 5/6 - focal minimal moderate expression. GCDFP - negative expression. Estrogen receptor - focal moderate strong expression. Napsin-A - negative expression. p63 - negative expression. Cytokeratin 7 - strong diffuse expression. The morphologic and immunophenotypic differentials include metastasis from mammary, gynecological or pulmonary primary. The case was reviewed with Dr Saralyn Pilar who concurs. The case was discussed with Robynn Pane, PA-C on 08/06/2014. (CRR:ecj 08/06/2014) [place] RUND DO Pathologist, Electronic Signature (Case signed 08/06/2014) Specimen Gross and Clinical Information Specimen(s) Obtained: Lung, needle/core biopsy(ies), right upper lobe Specimen Clinical Information large RUL mass into chest wall and rib, suspect lung cancer (cm) 1 of 2 Duplicate  copy FINAL for Cangelosi, Taffie  [phone]) Gross In formalin are three cores of tan gray soft tissue ranging from 1.2 x 0.1 cm to 1.5 x 0.1 cm, in toto in one block. (SW:ds 08/01/14) Stain(s) used in Diagnosis: The following stain(s) were used in diagnosing the case: ER - NOACIS, P63, CK 5/6, GCDFP, Napsin-A, CK-7, Thyroid Transcription Factor -1. The control(s) stained appropriately. Disclaimer Some of these immunohistochemical stains may have been developed and the performance characteristics determined by [facility]. Some may not have been cleared or approved by the U.S. Food and Drug Administration. The FDA has determined that such clearance or approval is not necessary. This test is used for clinical purposes. It should not be regarded as investigational or for research. This laboratory is certified under the [place] (CLIA-88) as qualified to perform high complexity clinical laboratory testing. Report signed out from the following location(s) Technical component and interpretation was performed at [facility], [place], [place] [address]. CLIA #: [id number],     IMPRESSION:  #1. 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 #2. Chronic obstructive pulmonary disease. #3. Gastroesophageal reflux disease.   PLAN:  #1. In the presence of her husband and daughter, the diagnosis was discussed and the management strategy outline. Patient requires systemic chemotherapy in addition to local radiotherapy to control brachio-plexopathic pain. Recommendation has been made for cisplatin, pemetrexed, and bevacizumab all given intravenously every 3 weeks with radiotherapy instituted simultaneously probably with cycle 3. The family was told this was not a  curable disease but was eminently treatable and that intervention will result in prolongation of survival. #2. Interventional radiology for life port insertion. #3. Information was given on the 3 drugs and an appointment will be made for nurse navigator intervention. #4. Folic acid 1 mg daily. #5. Patient should be considered totally disabled at least until 01/20/2015 while she receives treatment. #6. She was started on fentanyl 25 patch and oxycodone 5-10 mg every 4 hours for breakthrough pain. She was told to take the equivalent of Senokot-S 2 each night and drink plenty of fluids. #7. Followup on 09/02/2014 when chemotherapy will be instituted.     Doroteo Bradford, MD 08/14/2014 3:55 PM   DISCLAIMER:  This note was dictated with voice recognition softwre.  Similar sounding words can inadvertently be transcribed inaccurately and may not be corrected upon review.

## 2014-08-15 LAB — CORTISOL: Cortisol, Plasma: 18.3 ug/dL

## 2014-08-18 ENCOUNTER — Ambulatory Visit (HOSPITAL_COMMUNITY): Payer: PRIVATE HEALTH INSURANCE

## 2014-08-19 ENCOUNTER — Telehealth (HOSPITAL_COMMUNITY): Payer: Self-pay | Admitting: Hematology and Oncology

## 2014-08-19 MED ORDER — METOCLOPRAMIDE HCL 5 MG PO TABS: ORAL_TABLET | ORAL | Status: DC

## 2014-08-19 MED ORDER — PROCHLORPERAZINE MALEATE 10 MG PO TABS: ORAL_TABLET | ORAL | Status: DC

## 2014-08-19 MED ORDER — DEXAMETHASONE 4 MG PO TABS: ORAL_TABLET | ORAL | Status: DC

## 2014-08-19 MED ORDER — LIDOCAINE-PRILOCAINE 2.5-2.5 % EX CREA: TOPICAL_CREAM | CUTANEOUS | Status: DC

## 2014-08-19 NOTE — Telephone Encounter (Signed)
[facility] PER TEREAS P @[facility]  PT'S CHEMO [place] [phone] DO NOT REQUIRE PRE AUTH. MUST CONTACT [facility] FOR BEN INFO [facility] [phone]

## 2014-08-19 NOTE — Patient Instructions (Addendum)
Melissa Sullivan   CHEMOTHERAPY INSTRUCTIONS  Premeds: Prior to receiving chemotherapy, you will receive 3 drugs via your port. You will receive Aloxi & Emend (these are both high powered anti-nausea medications. They should stay in your body for up to 72 hours). The other medication you will receive is Dexamethasone via your port. This medication is being given primarily to decrease your risk of having a skin rash from the Pemetrexed chemo. A side effect of the Dexamethasone is that you could feel energized, get flushed/hot feeling in the face/neck/chest (this will go away), make you feel anxious/nervous, make you have difficulty sleeping. These are all side effects that will pass as the medication wears off. (These medications will take approximately 30 minutes to infuse)  Pemetrexed - Bone marrow suppression, fatigue, nausea/vomiting, chest pain, and/or shortness of breath. While taking Pemetrexed, you will have to take Folic acid everyday at home. We will also give you Vit B12 injections prior to chemo and periodically throughout chemo. You will be responsible for taking a steroid Dexamethasone while taking Pemetrexed. You will take it the day before, day of, and day after Pemetrexed. This is to decrease your incidence of developing a skin rash from the Pemetrexed. (This medication takes 10-30 minutes to infuse)  Cisplatin - this medication is hard on your kidneys. This is why we give you a large amount of fluids through your IV while you are here getting chemo. We also need you drinking 64 oz of fluid (preferably water/decaff fluids) 2 days prior to chemo and for up to 4-5 days after chemo. Drink more if you can. This will help keep your kidneys flushed. This can also cause acute and/or delayed nausea/vomiting. You must take your nausea meds as prescribed if you get nauseated. Do not wait until you start vomiting. This med can also cause peripheral neuropathy  (numbness/tingling/burning in hands/fingers/feet/toes). Let us know if this develops so that we can monitor it and treat if necessary. (This medication will take approximately 1 hour to infuse) (You will also receive IV fluids for 2 hours prior to and 2 hours after receiving this chemo - this is to aid in the protection of your kidneys from this chemo)  Avastin - this medication is considered an anti-angiogenic therapy. Avastin is thought to work by causing the blood vessels to shrink away from the tumor, blocking the supply of oxygen and nutrients that the tumor needs to grow. Avastin may also cause the existing blood vessels to change in ways that help the chemotherapy reach the tumor more effectively. Avastin may also work to interfere with the growth of new blood vessels, causing the tumor to starve. Side Effects: Nosebleeds, High Blood Pressure, Protein in Urine, & Diarrhea. (This medication will take 30 minutes to infuse; the first time may take 60 minutes)  POTENTIAL SIDE EFFECTS OF TREATMENT: Increased Susceptibility to Infection, Vomiting, Constipation, Hair Thinning, Changes in Character of Skin and Nails (brittleness, dryness,etc.), Bone Marrow Suppression, Complete Hair Loss, Nausea, Diarrhea, Sun Sensitivity and Mouth Sores   SELF IMAGE NEEDS AND REFERRALS MADE: Obtain hair accessories as soon as possible (wigs, scarves, turbans,caps,etc.)    EDUCATIONAL MATERIALS GIVEN AND REVIEWED: Chemotherapy and You Booklet Specific Instructions Sheets: Pemetrexed, Cisplatin, Avastin, Folic Acid, VIT X52, Dexamethasone, Compazine, Reglan, EMLA, port-a-cath, Aloxi, Emend   SELF CARE ACTIVITIES WHILE ON CHEMOTHERAPY: Increase your fluid intake 48 hours prior to treatment and drink at least 2 quarts per day after treatment., No alcohol intake., No  aspirin or other medications unless approved by your oncologist., Eat foods that are light and easy to digest., Eat foods at cold or room temperature., No  fried, fatty, or spicy foods immediately before or after treatment., Have teeth cleaned professionally before starting treatment. Keep dentures and partial plates clean., Use soft toothbrush and do not use mouthwashes that contain alcohol. Biotene is a good mouthwash that is available at most pharmacies or may be ordered by calling [phone]., Use warm salt water gargles (1 teaspoon salt per 1 quart warm water) before and after meals and at bedtime. Or you may rinse with 2 tablespoons of three -percent hydrogen peroxide mixed in eight ounces of water., Always use sunscreen with SPF (Sun Protection Factor) of 30 or higher., Use your nausea medication as directed to prevent nausea. and Use your stool softener or laxative as directed to prevent constipation.  Please wash your hands for at least 30 seconds using warm soapy water. Handwashing is the #1 way to prevent the spread of germs. Stay away from sick people or people who are getting over a cold. If you develop respiratory systems such as green/yellow mucus production or productive cough or persistent cough let us know and we will see if you need an antibiotic. It is a good idea to keep a pair of gloves on when going into grocery stores/[employer] to decrease your risk of coming into contact with germs on the carts, etc. Carry alcohol hand gel with you at all times and use it frequently if out in public. All foods need to be cooked thoroughly. No raw foods. No medium or undercooked meats, eggs. If your food is cooked medium well, it does not need to be hot pink or saturated with bloody liquid at all. Vegetables and fruits need to be washed/rinsed under the faucet with a dish detergent before being consumed. You can eat raw fruits and vegetables unless we tell you otherwise but it would be best if you cooked them or bought frozen. Do not eat off of salad bars or hot bars unless you really trust the cleanliness of the restaurant. If you need dental work, please  let us know before you go for your appointment so that we can coordinate the best possible time for you in regards to your chemo regimen. You need to also let your dentist know that you are actively taking chemo. We may need to do labs prior to your dental appointment. We also want your bowels moving at least every other day. If this is not happening, we need to know so that we can get you on a bowel regimen to help you go.     MEDICATIONS: You have been given prescriptions for the following medications  Dexamethasone 20m tablet. The day before, day of, and day after chemo take 2 tablets in the am and 2 tablets in the pm. Repeat with each cycle of chemo.  Metoclopramide 524mtablet. Starting the day after chemo, take 1 tablet four times a day x 2 days. Then may take 1 tablet four times a day IF needed for nausea/vomting.   Prochloperazine/Compazine 1057mablet. Starting the day after chemo, take 1 tablet four times a day x 2 days. Then may take 1 tablet four times a day IF needed for nausea/vomiting.   EMLA cream. Apply a quarter size amount to port site 1 hour prior to chemo. Do not rub in. Cover with plastic wrap.   Folic acid 1mg73mblet. Take 1 tablet daily.  Continue taking this for 21 days after the completion of Pemetrexed chemotherapy.   Vit b12 injections. We will administer this to you prior to starting Pemetrexed and you will receive this periodically during Pemetrexed chemotherapy.   Over-the-Counter Meds:  Colace - this is a stool softener. Take 124m capsule 2-6 times a day as needed. If you have to take more than 6 capsules of Colace a day call the [facility]  Senna - this is a mild laxative used to treat mild constipation. May take 2 tabs by mouth daily or up to twice a day as needed for mild constipation.  Milk of Magnesia - this is a laxative used to treat moderate to severe constipation. May take 2-4 tablespoons every 8 hours as needed. May increase to 8 tablespoons x 1  dose and if no bowel movement call the [facility] POSSIBLE AFTER TREATMENT:  FEVER GREATER THAN 100.5 F  CHILLS WITH OR WITHOUT FEVER  NAUSEA AND VOMITING THAT IS NOT CONTROLLED WITH YOUR NAUSEA MEDICATION  UNUSUAL SHORTNESS OF BREATH  UNUSUAL BRUISING OR BLEEDING  TENDERNESS IN MOUTH AND THROAT WITH OR WITHOUT PRESENCE OF ULCERS  URINARY PROBLEMS  BOWEL PROBLEMS  UNUSUAL RASH    Wear comfortable clothing and clothing appropriate for easy access to any Portacath or PICC line. Let [place] if there is anything that we can do to make your therapy better!      I have been informed and understand all of the instructions given to me and have received a copy. I have been instructed to call the clinic ([phone] my family physician as soon as possible for continued medical care, if indicated. I do not have any more questions at this time but understand that I may call the [facility] the Patient Navigator at ([phone] office hours should I have questions or need assistance in obtaining follow-up care.           Pemetrexed injection What is this medicine? PEMETREXED (PEM e TREX ed) is a chemotherapy drug. This medicine affects cells that are rapidly growing, such as cancer cells and cells in your mouth and stomach. It is usually used to treat lung cancers like non-small cell lung cancer and mesothelioma. It may also be used to treat other cancers. This medicine may be used for other purposes; ask your health care provider or pharmacist if you have questions. COMMON BRAND NAME(S): Alimta What should I tell my health care provider before I take this medicine? They need to know if you have any of these conditions: -if you frequently drink alcohol containing beverages -infection (especially a virus infection such as chickenpox, cold sores, or herpes) -kidney disease -liver disease -low blood counts, like low platelets,  red bloods, or white blood cells -an unusual or allergic reaction to pemetrexed, mannitol, other medicines, foods, dyes, or preservatives -pregnant or trying to get pregnant -breast-feeding How should I use this medicine? This drug is given as an infusion into a vein. It is administered in a hospital or clinic by a specially trained health care professional. Talk to your pediatrician regarding the use of this medicine in children. Special care may be needed. Overdosage: If you think you have taken too much of this medicine contact a poison control center or emergency room at once. NOTE: This medicine is only for you. Do not share this medicine with others. What if I miss a dose? It is important not  to miss your dose. Call your doctor or health care professional if you are unable to keep an appointment. What may interact with this medicine? -aspirin and aspirin-like medicines -medicines to increase blood counts like filgrastim, pegfilgrastim, sargramostim -methotrexate -NSAIDS, medicines for pain and inflammation, like ibuprofen or naproxen -probenecid -pyrimethamine -vaccines Talk to your doctor or health care professional before taking any of these medicines: -acetaminophen -aspirin -ibuprofen -ketoprofen -naproxen This list may not describe all possible interactions. Give your health care provider a list of all the medicines, herbs, non-prescription drugs, or dietary supplements you use. Also tell them if you smoke, drink alcohol, or use illegal drugs. Some items may interact with your medicine. What should I watch for while using this medicine? Visit your doctor for checks on your progress. This drug may make you feel generally unwell. This is not uncommon, as chemotherapy can affect healthy cells as well as cancer cells. Report any side effects. Continue your course of treatment even though you feel ill unless your doctor tells you to stop. In some cases, you may be given additional  medicines to help with side effects. Follow all directions for their use. Call your doctor or health care professional for advice if you get a fever, chills or sore throat, or other symptoms of a cold or flu. Do not treat yourself. This drug decreases your body's ability to fight infections. Try to avoid being around people who are sick. This medicine may increase your risk to bruise or bleed. Call your doctor or health care professional if you notice any unusual bleeding. Be careful brushing and flossing your teeth or using a toothpick because you may get an infection or bleed more easily. If you have any dental work done, tell your dentist you are receiving this medicine. Avoid taking products that contain aspirin, acetaminophen, ibuprofen, naproxen, or ketoprofen unless instructed by your doctor. These medicines may hide a fever. Call your doctor or health care professional if you get diarrhea or mouth sores. Do not treat yourself. To protect your kidneys, drink water or other fluids as directed while you are taking this medicine. Men and women must use effective birth control while taking this medicine. You may also need to continue using effective birth control for a time after stopping this medicine. Do not become pregnant while taking this medicine. Tell your doctor right away if you think that you or your partner might be pregnant. There is a potential for serious side effects to an unborn child. Talk to your health care professional or pharmacist for more information. Do not breast-feed an infant while taking this medicine. This medicine may lower sperm counts. What side effects may I notice from receiving this medicine? Side effects that you should report to your doctor or health care professional as soon as possible: -allergic reactions like skin rash, itching or hives, swelling of the face, lips, or tongue -low blood counts - this medicine may decrease the number of white blood cells, red  blood cells and platelets. You may be at increased risk for infections and bleeding. -signs of infection - fever or chills, cough, sore throat, pain or difficulty passing urine -signs of decreased platelets or bleeding - bruising, pinpoint red spots on the skin, black, tarry stools, blood in the urine -signs of decreased red blood cells - unusually weak or tired, fainting spells, lightheadedness -breathing problems, like a dry cough -changes in emotions or moods -chest pain -confusion -diarrhea -high blood pressure -mouth or throat sores or ulcers -  pain, swelling, warmth in the leg -pain on swallowing -swelling of the ankles, feet, hands -trouble passing urine or change in the amount of urine -vomiting -yellowing of the eyes or skin Side effects that usually do not require medical attention (report to your doctor or health care professional if they continue or are bothersome): -hair loss -loss of appetite -nausea -stomach upset This list may not describe all possible side effects. Call your doctor for medical advice about side effects. You may report side effects to FDA at 1-800-FDA-1088. Where should I keep my medicine? This drug is given in a hospital or clinic and will not be stored at home. NOTE: This sheet is a summary. It may not cover all possible information. If you have questions about this medicine, talk to your doctor, pharmacist, or health care provider.  2015, [employer]/Gold Standard. (2008-06-10 13:24:03) Cisplatin injection What is this medicine? CISPLATIN (SIS pla tin) is a chemotherapy drug. It targets fast dividing cells, like cancer cells, and causes these cells to die. This medicine is used to treat many types of cancer like bladder, ovarian, and testicular cancers. This medicine may be used for other purposes; ask your health care provider or pharmacist if you have questions. COMMON BRAND NAME(S): Platinol, Platinol -AQ What should I tell my health care provider  before I take this medicine? They need to know if you have any of these conditions: -blood disorders -hearing problems -kidney disease -recent or ongoing radiation therapy -an unusual or allergic reaction to cisplatin, carboplatin, other chemotherapy, other medicines, foods, dyes, or preservatives -pregnant or trying to get pregnant -breast-feeding How should I use this medicine? This drug is given as an infusion into a vein. It is administered in a hospital or clinic by a specially trained health care professional. Talk to your pediatrician regarding the use of this medicine in children. Special care may be needed. Overdosage: If you think you have taken too much of this medicine contact a poison control center or emergency room at once. NOTE: This medicine is only for you. Do not share this medicine with others. What if I miss a dose? It is important not to miss a dose. Call your doctor or health care professional if you are unable to keep an appointment. What may interact with this medicine? -dofetilide -foscarnet -medicines for seizures -medicines to increase blood counts like filgrastim, pegfilgrastim, sargramostim -probenecid -pyridoxine used with altretamine -rituximab -some antibiotics like amikacin, gentamicin, neomycin, polymyxin B, streptomycin, tobramycin -sulfinpyrazone -vaccines -zalcitabine Talk to your doctor or health care professional before taking any of these medicines: -acetaminophen -aspirin -ibuprofen -ketoprofen -naproxen This list may not describe all possible interactions. Give your health care provider a list of all the medicines, herbs, non-prescription drugs, or dietary supplements you use. Also tell them if you smoke, drink alcohol, or use illegal drugs. Some items may interact with your medicine. What should I watch for while using this medicine? Your condition will be monitored carefully while you are receiving this medicine. You will need important  blood work done while you are taking this medicine. This drug may make you feel generally unwell. This is not uncommon, as chemotherapy can affect healthy cells as well as cancer cells. Report any side effects. Continue your course of treatment even though you feel ill unless your doctor tells you to stop. In some cases, you may be given additional medicines to help with side effects. Follow all directions for their use. Call your doctor or health care professional for advice  if you get a fever, chills or sore throat, or other symptoms of a cold or flu. Do not treat yourself. This drug decreases your body's ability to fight infections. Try to avoid being around people who are sick. This medicine may increase your risk to bruise or bleed. Call your doctor or health care professional if you notice any unusual bleeding. Be careful brushing and flossing your teeth or using a toothpick because you may get an infection or bleed more easily. If you have any dental work done, tell your dentist you are receiving this medicine. Avoid taking products that contain aspirin, acetaminophen, ibuprofen, naproxen, or ketoprofen unless instructed by your doctor. These medicines may hide a fever. Do not become pregnant while taking this medicine. Women should inform their doctor if they wish to become pregnant or think they might be pregnant. There is a potential for serious side effects to an unborn child. Talk to your health care professional or pharmacist for more information. Do not breast-feed an infant while taking this medicine. Drink fluids as directed while you are taking this medicine. This will help protect your kidneys. Call your doctor or health care professional if you get diarrhea. Do not treat yourself. What side effects may I notice from receiving this medicine? Side effects that you should report to your doctor or health care professional as soon as possible: -allergic reactions like skin rash, itching or  hives, swelling of the face, lips, or tongue -signs of infection - fever or chills, cough, sore throat, pain or difficulty passing urine -signs of decreased platelets or bleeding - bruising, pinpoint red spots on the skin, black, tarry stools, nosebleeds -signs of decreased red blood cells - unusually weak or tired, fainting spells, lightheadedness -breathing problems -changes in hearing -gout pain -low blood counts - This drug may decrease the number of white blood cells, red blood cells and platelets. You may be at increased risk for infections and bleeding. -nausea and vomiting -pain, swelling, redness or irritation at the injection site -pain, tingling, numbness in the hands or feet -problems with balance, movement -trouble passing urine or change in the amount of urine Side effects that usually do not require medical attention (report to your doctor or health care professional if they continue or are bothersome): -changes in vision -loss of appetite -metallic taste in the mouth or changes in taste This list may not describe all possible side effects. Call your doctor for medical advice about side effects. You may report side effects to FDA at 1-800-FDA-1088. Where should I keep my medicine? This drug is given in a hospital or clinic and will not be stored at home. NOTE: This sheet is a summary. It may not cover all possible information. If you have questions about this medicine, talk to your doctor, pharmacist, or health care provider.  2015, [employer]/Gold Standard. (2008-02-12 14:40:54) Bevacizumab injection What is this medicine? BEVACIZUMAB (be va SIZ yoo mab) is a chemotherapy drug. It targets a protein found in many cancer cell types, and halts cancer growth. This drug treats many cancers including non-small cell lung cancer, ovarian cancer, cervical cancer, and colon or rectal cancer. It is usually given with other chemotherapy drugs. This medicine may be used for other purposes;  ask your health care provider or pharmacist if you have questions. COMMON BRAND NAME(S): Avastin What should I tell my health care provider before I take this medicine? They need to know if you have any of these conditions: -blood clots -heart disease, including  heart failure, heart attack, or chest pain (angina) -high blood pressure -infection (especially a virus infection such as chickenpox, cold sores, or herpes) -kidney disease -lung disease -prior chemotherapy with doxorubicin, daunorubicin, epirubicin, or other anthracycline type chemotherapy agents -recent or ongoing radiation therapy -recent surgery -stroke -an unusual or allergic reaction to bevacizumab, hamster proteins, mouse proteins, other medicines, foods, dyes, or preservatives -pregnant or trying to get pregnant -breast-feeding How should I use this medicine? This medicine is for infusion into a vein. It is given by a health care professional in a hospital or clinic setting. Talk to your pediatrician regarding the use of this medicine in children. Special care may be needed. Overdosage: If you think you have taken too much of this medicine contact a poison control center or emergency room at once. NOTE: This medicine is only for you. Do not share this medicine with others. What if I miss a dose? It is important not to miss your dose. Call your doctor or health care professional if you are unable to keep an appointment. What may interact with this medicine? Interactions are not expected. This list may not describe all possible interactions. Give your health care provider a list of all the medicines, herbs, non-prescription drugs, or dietary supplements you use. Also tell them if you smoke, drink alcohol, or use illegal drugs. Some items may interact with your medicine. What should I watch for while using this medicine? Your condition will be monitored carefully while you are receiving this medicine. You will need important  blood work and urine testing done while you are taking this medicine. During your treatment, let your health care professional know if you have any unusual symptoms, such as difficulty breathing. This medicine may rarely cause 'gastrointestinal perforation' (holes in the stomach, intestines or colon), a serious side effect requiring surgery to repair. This medicine should be started at least 28 days following major surgery and the site of the surgery should be totally healed. Check with your doctor before scheduling dental work or surgery while you are receiving this treatment. Talk to your doctor if you have recently had surgery or if you have a wound that has not healed. Do not become pregnant while taking this medicine. Women should inform their doctor if they wish to become pregnant or think they might be pregnant. There is a potential for serious side effects to an unborn child. Talk to your health care professional or pharmacist for more information. Do not breast-feed an infant while taking this medicine. This medicine has caused ovarian failure in some women. This medicine may interfere with the ability to have a child. You should talk to your doctor or health care professional if you are concerned about your fertility. What side effects may I notice from receiving this medicine? Side effects that you should report to your doctor or health care professional as soon as possible: -allergic reactions like skin rash, itching or hives, swelling of the face, lips, or tongue -signs of infection - fever or chills, cough, sore throat, pain or trouble passing urine -signs of decreased platelets or bleeding - bruising, pinpoint red spots on the skin, black, tarry stools, nosebleeds, blood in the urine -breathing problems -changes in vision -chest pain -confusion -jaw pain, especially after dental work -mouth sores -seizures -severe abdominal pain -severe headache -sudden numbness or weakness of the  face, arm or leg -swelling of legs or ankles -symptoms of a stroke: change in mental awareness, inability to talk or move one side  of the body (especially in patients with lung cancer) -trouble passing urine or change in the amount of urine -trouble speaking or understanding -trouble walking, dizziness, loss of balance or coordination Side effects that usually do not require medical attention (report to your doctor or health care professional if they continue or are bothersome): -constipation -diarrhea -dry skin -headache -loss of appetite -nausea, vomiting This list may not describe all possible side effects. Call your doctor for medical advice about side effects. You may report side effects to FDA at 1-800-FDA-1088. Where should I keep my medicine? This drug is given in a hospital or clinic and will not be stored at home. NOTE: This sheet is a summary. It may not cover all possible information. If you have questions about this medicine, talk to your doctor, pharmacist, or health care provider.  2015, Elsevier/[employer] Standard. ([id number] 01:65:53) Folic Acid, Vitamin B9 tablets What is this medicine? FOLIC ACID (FOE lik AS id) is a water-soluble, B complex vitamin. It is in many foods like liver, kidneys, yeast, and leafy, green vegetables. It is used to treat megaloblastic anemia and anemia from poor diet in pregnant women, babies, and children. This medicine may be used for other purposes; ask your health care provider or pharmacist if you have questions. COMMON BRAND NAME(S): Folacin, Folicet What should I tell my health care provider before I take this medicine? They need to know if you have any of these conditions: -alcoholism or alcohol cirrhosis -pernicious anemia -vitamin B12 deficient anemia -an unusual or allergic reaction to folic acid, other B vitamins, other medicines, foods, dyes, or preservatives -pregnant or trying to get pregnant -breast-feeding How should I use this  medicine? Take this medicine by mouth with a glass of water. Follow the directions on your prescription label. Take your doses at regular intervals. Do not stop taking your medicine unless your doctor tells you to. Talk to your pediatrician regarding the use of this medicine in children. While this drug may be prescribed for selected conditions, precautions do apply. Overdosage: If you think you have taken too much of this medicine contact a poison control center or emergency room at once. NOTE: This medicine is only for you. Do not share this medicine with others. What if I miss a dose? If you miss a dose, take it as soon as you can. If it is almost time for your next dose, take only that dose. Do not take double or extra doses. What may interact with this medicine? -chloramphenicol -cholestyramine -medicines for seizures -methotrexate -nitrofurantoin -pyrimethamine This list may not describe all possible interactions. Give your health care provider a list of all the medicines, herbs, non-prescription drugs, or dietary supplements you use. Also tell them if you smoke, drink alcohol, or use illegal drugs. Some items may interact with your medicine. What should I watch for while using this medicine? Visit your doctor or health care professional for regular check ups. Your doctor may order blood tests. You need to eat a proper diet even while you are taking this vitamin. Taking vitamin supplements is not a substitute for a healthy diet. Ask your doctor or health care provider for good nutrition advice. What side effects may I notice from receiving this medicine? Side effects that you should report to your doctor or health care professional as soon as possible: -allergic reactions such as skin rash or itching, hives, swelling of the lips, mouth, tongue, or throat -chest tightness or pain -wheezing or shortness of breath Side effects that  usually do not require medical attention (report to your  doctor or health care professional if they continue or are bothersome): -bitter or bad taste -confusion -irritable -loss of appetite -nausea -stomach gas This list may not describe all possible side effects. Call your doctor for medical advice about side effects. You may report side effects to FDA at 1-800-FDA-1088. Where should I keep my medicine? Keep out of the reach of children. Store at room temperature between 15 and 30 degrees C (59 and 86 degrees F). Protect from light. This medicine is quickly broken down and made inactive when exposed to heat or light. Throw away any unused medicine after the expiration date. NOTE: This sheet is a summary. It may not cover all possible information. If you have questions about this medicine, talk to your doctor, pharmacist, or health care provider.  2015, Elsevier/[employer] Standard. (2008-03-12 17:03:56) Vitamin B12 Injections Every person needs vitamin B12. A deficiency develops when the body does not get enough of it. One way to overcome this is by getting B12 shots (injections). A B12 shot puts the vitamin directly into muscle tissue. This avoids any problems your body might have in absorbing it from food or a pill. In some people, the body has trouble using the vitamin correctly. This can cause a B12 deficiency. Not consuming enough of the vitamin can also cause a deficiency. Getting enough vitamin B12 can be hard for elderly people. Sometimes, they do not eat a well-balanced diet. The elderly are also more likely than younger people to have medical conditions or take medications that can lead to a deficiency. WHAT DOES VITAMIN B12 DO? Vitamin B12 does many things to help the body work right:  It helps the body make healthy red blood cells.  It helps maintain nerve cells.  It is involved in the body's process of converting food into energy (metabolism).  It is needed to make the genetic material in all cells (DNA). VITAMIN B12 FOOD SOURCES Most  people get plenty of vitamin B12 through the foods they eat. It is present in:  Meat, fish, poultry, and eggs.  Milk and milk products.  It also is added when certain foods are made, including some breads, cereals and yogurts. The food is then called "fortified". CAUSES The most common causes of vitamin B12 deficiency are:  Pernicious anemia. The condition develops when the body cannot make enough healthy red blood cells. This stems from a lack of a protein made in the stomach (intrinsic factor). People without this protein cannot absorb enough vitamin B12 from food.  Malabsorption. This is when the body cannot absorb the vitamin. It can be caused by:  Pernicious anemia.  Surgery to remove part or all of the stomach can lead to malabsorption. Removal of part or all of the small intestine can also cause malabsorption.  Vegetarian diet. People who are strict about not eating foods from animals could have trouble taking in enough vitamin B12 from diet alone.  Medications. Some medicines have been linked to B12 deficiency, such as Metformin (a drug prescribed for type 2 diabetes). Long-term use of stomach acid suppressants also can keep the vitamin from being absorbed.  Intestinal problems such as inflammatory bowel disease. If there are problems in the digestive tract, vitamin B12 may not be absorbed in good enough amounts. SYMPTOMS People who do not get enough B12 can develop problems. These can include:  Anemia. This is when the body has too few red blood cells. Red blood cells carry  oxygen to the rest of the body. Without a healthy supply of red blood cells, people can feel:  Tired (fatigued).  Weak.  Severe anemia can cause:  Shortness of breath.  Dizziness.  Rapid heart rate.  Paleness.  Other Vitamin B12 deficiency symptoms include:  Diarrhea.  Numbness or tingling in the hands or feet.  Loss of appetite.  Confusion.  Sores on the tongue or in the mouth. LET  YOUR CAREGIVER KNOW ABOUT:  Any allergies. It is very important to know if you are allergic or sensitive to cobalt. Vitamin B12 contains cobalt.  Any history of kidney disease.  All medications you are taking. Include prescription and over-the-counter medicines, herbs and creams.  Whether you are pregnant or breast-feeding.  If you have Leber's disease, a hereditary eye condition, vitamin B12 could make it worse. RISKS AND COMPLICATIONS Reactions to an injection are usually temporary. They might include:  Pain at the injection site.  Redness, swelling or tenderness at the site.  Headache, dizziness or weakness.  Nausea, upset stomach or diarrhea.  Numbness or tingling.  Fever.  Joint pain.  Itching or rash. If a reaction does not go away in a short while, talk with your healthcare provider. A change in the way the shots are given, or where they are given, might need to be made. BEFORE AN INJECTION To decide whether B12 injections are right for you, your healthcare provider will probably:  Ask about your medical history.  Ask questions about your diet.  Ask about symptoms such as:  Have you felt weak?  Do you feel unusually tired?  Do you get dizzy?  Order blood tests. These may include a test to:  Check the level of red cells in your blood.  Measure B12 levels.  Check for the presence of intrinsic factor. VITAMIN B12 INJECTIONS How often you will need a vitamin B12 injection will depend on how severe your deficiency is. This also will affect how long you will need to get them. People with pernicious anemia usually get injections for their entire life. Others might get them for a shorter period. For many people, injections are given daily or weekly for several weeks. Then, once B12 levels are normal, injections are given just once a month. If the cause of the deficiency can be fixed, the injections can be stopped. Talk with your healthcare provider about what you  should expect. For an injection:  The injection site will be cleaned with an alcohol swab.  Your healthcare provider will insert a needle directly into a muscle. Most any muscle can be used. Most often, an arm muscle is used. A buttocks muscle can also be used. Many people say shots in that area are less painful.  A small adhesive bandage may be put over the injection site. It usually can be taken off in an hour or less. Injections can be given by your healthcare provider. In some cases, family members give them. Sometimes, people give them to themselves. Talk with your healthcare provider about what would be best for you. If someone other than your healthcare provider will be giving the shots, the person will need to be trained to give them correctly. HOME CARE INSTRUCTIONS   You can remove the adhesive bandage within an hour of getting a shot.  You should be able to go about your normal activities right away.  Avoid drinking large amounts of alcohol while taking vitamin B12 shots. Alcohol can interfere with the body's use of the  vitamin. SEEK MEDICAL CARE IF:   Pain, redness, swelling or tenderness at the injection site does not get better or gets worse.  Headache, dizziness or weakness does not go away.  You develop a fever of more than 100.5 F (38.1 C). SEEK IMMEDIATE MEDICAL CARE IF:   You have chest pain.  You develop shortness of breath.  You have muscle weakness that gets worse.  You develop numbness, weakness or tingling on one side or one area of the body.  You have symptoms of an allergic reaction, such as:  Hives.  Difficulty breathing.  Swelling of the lips, face, tongue or throat.  You develop a fever of more than 102.0 F (38.9 C). MAKE SURE YOU:   Understand these instructions.  Will watch your condition.  Will get help right away if you are not doing well or get worse. Document Released: 02/03/2009 Document Revised: 01/30/2012 Document Reviewed:  02/03/2009 [facility] Patient Information 2015 [name], [place]. This information is not intended to replace advice given to you by your health care provider. Make sure you discuss any questions you have with your health care provider. Dexamethasone tablets What is this medicine? DEXAMETHASONE (dex a METH a sone) is a corticosteroid. It is commonly used to treat inflammation of the skin, joints, lungs, and other organs. Common conditions treated include asthma, allergies, and arthritis. It is also used for other conditions, such as blood disorders and diseases of the adrenal glands. This medicine may be used for other purposes; ask your health care provider or pharmacist if you have questions. COMMON BRAND NAME(S): Decadron, DexPak Sterling Big, DexPak TaperPak, Zema-Pak What should I tell my health care provider before I take this medicine? They need to know if you have any of these conditions: -Cushing's syndrome -diabetes -glaucoma -heart problems or disease -high blood pressure -infection like herpes, measles, tuberculosis, or chickenpox -kidney disease -liver disease -mental problems -myasthenia gravis -osteoporosis -previous heart attack -seizures -stomach, ulcer or intestine disease including colitis and diverticulitis -thyroid problem -an unusual or allergic reaction to dexamethasone, corticosteroids, other medicines, lactose, foods, dyes, or preservatives -pregnant or trying to get pregnant -breast-feeding How should I use this medicine? Take this medicine by mouth with a drink of water. Follow the directions on the prescription label. Take it with food or milk to avoid stomach upset. If you are taking this medicine once a day, take it in the morning. Do not take more medicine than you are told to take. Do not suddenly stop taking your medicine because you may develop a severe reaction. Your doctor will tell you how much medicine to take. If your doctor wants you to stop the  medicine, the dose may be slowly lowered over time to avoid any side effects. Talk to your pediatrician regarding the use of this medicine in children. Special care may be needed. Patients over 57 years old may have a stronger reaction and need a smaller dose. Overdosage: If you think you have taken too much of this medicine contact a poison control center or emergency room at once. NOTE: This medicine is only for you. Do not share this medicine with others. What if I miss a dose? If you miss a dose, take it as soon as you can. If it is almost time for your next dose, talk to your doctor or health care professional. You may need to miss a dose or take an extra dose. Do not take double or extra doses without advice. What may interact with this  medicine? Do not take this medicine with any of the following medications: -mifepristone, RU-486 -vaccines This medicine may also interact with the following medications: -amphotericin B -antibiotics like clarithromycin, erythromycin, and troleandomycin -aspirin and aspirin-like drugs -barbiturates like phenobarbital -carbamazepine -cholestyramine -cholinesterase inhibitors like donepezil, galantamine, rivastigmine, and tacrine -cyclosporine -digoxin -diuretics -ephedrine -female hormones, like estrogens or progestins and birth control pills -indinavir -isoniazid -ketoconazole -medicines for diabetes -medicines that improve muscle tone or strength for conditions like myasthenia gravis -NSAIDs, medicines for pain and inflammation, like ibuprofen or naproxen -phenytoin -rifampin -thalidomide -warfarin This list may not describe all possible interactions. Give your health care provider a list of all the medicines, herbs, non-prescription drugs, or dietary supplements you use. Also tell them if you smoke, drink alcohol, or use illegal drugs. Some items may interact with your medicine. What should I watch for while using this medicine? Visit your  doctor or health care professional for regular checks on your progress. If you are taking this medicine over a prolonged period, carry an identification card with your name and address, the type and dose of your medicine, and your doctor's name and address. This medicine may increase your risk of getting an infection. Stay away from people who are sick. Tell your doctor or health care professional if you are around anyone with measles or chickenpox. If you are going to have surgery, tell your doctor or health care professional that you have taken this medicine within the last twelve months. Ask your doctor or health care professional about your diet. You may need to lower the amount of salt you eat. The medicine can increase your blood sugar. If you are a diabetic check with your doctor if you need help adjusting the dose of your diabetic medicine. What side effects may I notice from receiving this medicine? Side effects that you should report to your doctor or health care professional as soon as possible: -allergic reactions like skin rash, itching or hives, swelling of the face, lips, or tongue -changes in vision -fever, sore throat, sneezing, cough, or other signs of infection, wounds that will not heal -increased thirst -mental depression, mood swings, mistaken feelings of self importance or of being mistreated -pain in hips, back, ribs, arms, shoulders, or legs -redness, blistering, peeling or loosening of the skin, including inside the mouth -trouble passing urine or change in the amount of urine -swelling of feet or lower legs -unusual bleeding or bruising Side effects that usually do not require medical attention (report to your doctor or health care professional if they continue or are bothersome): -headache -nausea, vomiting -skin problems, acne, thin and shiny skin -weight gain This list may not describe all possible side effects. Call your doctor for medical advice about side  effects. You may report side effects to FDA at 1-800-FDA-1088. Where should I keep my medicine? Keep out of the reach of children. Store at room temperature between 20 and 25 degrees C (68 and 77 degrees F). Protect from light. Throw away any unused medicine after the expiration date. NOTE: This sheet is a summary. It may not cover all possible information. If you have questions about this medicine, talk to your doctor, pharmacist, or health care provider.  2015, [employer]vier/[employer] Standard. (2008-02-28 14:02:13) Metoclopramide tablets What is this medicine? METOCLOPRAMIDE (met oh kloe PRA mide) is used to treat the symptoms of gastroesophageal reflux disease (GERD) like heartburn. It is also used to treat people with slow emptying of the stomach and intestinal tract. This medicine may  be used for other purposes; ask your health care provider or pharmacist if you have questions. COMMON BRAND NAME(S): Reglan What should I tell my health care provider before I take this medicine? They need to know if you have any of these conditions: -breast cancer -depression -diabetes -heart failure -high blood pressure -kidney disease -liver disease -Parkinson's disease or a movement disorder -pheochromocytoma -seizures -stomach obstruction, bleeding, or perforation -an unusual or allergic reaction to metoclopramide, procainamide, sulfites, other medicines, foods, dyes, or preservatives -pregnant or trying to get pregnant -breast-feeding How should I use this medicine? Take this medicine by mouth with a glass of water. Follow the directions on the prescription label. Take this medicine on an empty stomach, about 30 minutes before eating. Take your doses at regular intervals. Do not take your medicine more often than directed. Do not stop taking except on the advice of your doctor or health care professional. A special MedGuide will be given to you by the pharmacist with each prescription and refill. Be sure  to read this information carefully each time. Talk to your pediatrician regarding the use of this medicine in children. Special care may be needed. Overdosage: If you think you have taken too much of this medicine contact a poison control center or emergency room at once. NOTE: This medicine is only for you. Do not share this medicine with others. What if I miss a dose? If you miss a dose, take it as soon as you can. If it is almost time for your next dose, take only that dose. Do not take double or extra doses. What may interact with this medicine? -acetaminophen -cyclosporine -digoxin -medicines for blood pressure -medicines for diabetes, including insulin -medicines for hay fever and other allergies -medicines for depression, especially an Monoamine Oxidase Inhibitor (MAOI) -medicines for Parkinson's disease, like levodopa -medicines for sleep or for pain -tetracycline This list may not describe all possible interactions. Give your health care provider a list of all the medicines, herbs, non-prescription drugs, or dietary supplements you use. Also tell them if you smoke, drink alcohol, or use illegal drugs. Some items may interact with your medicine. What should I watch for while using this medicine? It may take a few weeks for your stomach condition to start to get better. However, do not take this medicine for longer than 12 weeks. The longer you take this medicine, and the more you take it, the greater your chances are of developing serious side effects. If you are an elderly patient, a female patient, or you have diabetes, you may be at an increased risk for side effects from this medicine. Contact your doctor immediately if you start having movements you cannot control such as lip smacking, rapid movements of the tongue, involuntary or uncontrollable movements of the eyes, head, arms and legs, or muscle twitches and spasms. Patients and their families should watch out for worsening  depression or thoughts of suicide. Also watch out for any sudden or severe changes in feelings such as feeling anxious, agitated, panicky, irritable, hostile, aggressive, impulsive, severely restless, overly excited and hyperactive, or not being able to sleep. If this happens, especially at the beginning of treatment or after a change in dose, call your doctor. Do not treat yourself for high fever. Ask your doctor or health care professional for advice. You may get drowsy or dizzy. Do not drive, use machinery, or do anything that needs mental alertness until you know how this drug affects you. Do not stand or sit  up quickly, especially if you are an older patient. This reduces the risk of dizzy or fainting spells. Alcohol can make you more drowsy and dizzy. Avoid alcoholic drinks. What side effects may I notice from receiving this medicine? Side effects that you should report to your doctor or health care professional as soon as possible: -allergic reactions like skin rash, itching or hives, swelling of the face, lips, or tongue -abnormal production of milk in females -breast enlargement in both males and females -change in the way you walk -difficulty moving, speaking or swallowing -drooling, lip smacking, or rapid movements of the tongue -excessive sweating -fever -involuntary or uncontrollable movements of the eyes, head, arms and legs -irregular heartbeat or palpitations -muscle twitches and spasms -unusually weak or tired Side effects that usually do not require medical attention (report to your doctor or health care professional if they continue or are bothersome): -change in sex drive or performance -depressed mood -diarrhea -difficulty sleeping -headache -menstrual changes -restless or nervous This list may not describe all possible side effects. Call your doctor for medical advice about side effects. You may report side effects to FDA at 1-800-FDA-1088. Where should I keep my  medicine? Keep out of the reach of children. Store at room temperature between 20 and 25 degrees C (68 and 77 degrees F). Protect from light. Keep container tightly closed. Throw away any unused medicine after the expiration date. NOTE: This sheet is a summary. It may not cover all possible information. If you have questions about this medicine, talk to your doctor, pharmacist, or health care provider.  2015, Elsevier/Gold Standard. (2012-03-06 13:04:38) Prochlorperazine tablets What is this medicine? PROCHLORPERAZINE (proe klor PER a zeen) helps to control severe nausea and vomiting. This medicine is also used to treat schizophrenia. It can also help patients who experience anxiety that is not due to psychological illness. This medicine may be used for other purposes; ask your health care provider or pharmacist if you have questions. COMMON BRAND NAME(S): Compazine What should I tell my health care provider before I take this medicine? They need to know if you have any of these conditions: -blood disorders or disease -dementia -liver disease or jaundice -Parkinson's disease -uncontrollable movement disorder -an unusual or allergic reaction to prochlorperazine, other medicines, foods, dyes, or preservatives -pregnant or trying to get pregnant -breast-feeding How should I use this medicine? Take this medicine by mouth with a glass of water. Follow the directions on the prescription label. Take your doses at regular intervals. Do not take your medicine more often than directed. Do not stop taking this medicine suddenly. This can cause nausea, vomiting, and dizziness. Ask your doctor or health care professional for advice. Talk to your pediatrician regarding the use of this medicine in children. Special care may be needed. While this drug may be prescribed for children as young as 2 years for selected conditions, precautions do apply. Overdosage: If you think you have taken too much of this  medicine contact a poison control center or emergency room at once. NOTE: This medicine is only for you. Do not share this medicine with others. What if I miss a dose? If you miss a dose, take it as soon as you can. If it is almost time for your next dose, take only that dose. Do not take double or extra doses. What may interact with this medicine? Do not take this medicine with any of the following medications: -amoxapine -antidepressants like citalopram, escitalopram, fluoxetine, paroxetine, and sertraline -deferoxamine -dofetilide -  maprotiline -tricyclic antidepressants like amitriptyline, clomipramine, imipramine, nortiptyline and others This medicine may also interact with the following medications: -lithium -medicines for pain -phenytoin -propranolol -warfarin This list may not describe all possible interactions. Give your health care provider a list of all the medicines, herbs, non-prescription drugs, or dietary supplements you use. Also tell them if you smoke, drink alcohol, or use illegal drugs. Some items may interact with your medicine. What should I watch for while using this medicine? Visit your doctor or health care professional for regular checks on your progress. You may get drowsy or dizzy. Do not drive, use machinery, or do anything that needs mental alertness until you know how this medicine affects you. Do not stand or sit up quickly, especially if you are an older patient. This reduces the risk of dizzy or fainting spells. Alcohol may interfere with the effect of this medicine. Avoid alcoholic drinks. This medicine can reduce the response of your body to heat or cold. Dress warm in cold weather and stay hydrated in hot weather. If possible, avoid extreme temperatures like saunas, hot tubs, very hot or cold showers, or activities that can cause dehydration such as vigorous exercise. This medicine can make you more sensitive to the sun. Keep out of the sun. If you cannot  avoid being in the sun, wear protective clothing and use sunscreen. Do not use sun lamps or tanning beds/booths. Your mouth may get dry. Chewing sugarless gum or sucking hard candy, and drinking plenty of water may help. Contact your doctor if the problem does not go away or is severe. What side effects may I notice from receiving this medicine? Side effects that you should report to your doctor or health care professional as soon as possible: -blurred vision -breast enlargement in men or women -breast milk in women who are not breast-feeding -chest pain, fast or irregular heartbeat -confusion, restlessness -dark yellow or brown urine -difficulty breathing or swallowing -dizziness or fainting spells -drooling, shaking, movement difficulty (shuffling walk) or rigidity -fever, chills, sore throat -involuntary or uncontrollable movements of the eyes, mouth, head, arms, and legs -seizures -stomach area pain -unusually weak or tired -unusual bleeding or bruising -yellowing of skin or eyes Side effects that usually do not require medical attention (report to your doctor or health care professional if they continue or are bothersome): -difficulty passing urine -difficulty sleeping -headache -sexual dysfunction -skin rash, or itching This list may not describe all possible side effects. Call your doctor for medical advice about side effects. You may report side effects to FDA at 1-800-FDA-1088. Where should I keep my medicine? Keep out of the reach of children. Store at room temperature between 15 and 30 degrees C (59 and 86 degrees F). Protect from light. Throw away any unused medicine after the expiration date. NOTE: This sheet is a summary. It may not cover all possible information. If you have questions about this medicine, talk to your doctor, pharmacist, or health care provider.  2015, Elsevier/Gold Standard. (2012-03-27 16:59:39) Lidocaine; Prilocaine cream What is this  medicine? LIDOCAINE; PRILOCAINE (LYE doe kane; PRIL oh kane) is a topical anesthetic that causes loss of feeling in the skin and surrounding tissues. It is used to numb the skin before procedures or injections. This medicine may be used for other purposes; ask your health care provider or pharmacist if you have questions. COMMON BRAND NAME(S): EMLA What should I tell my health care provider before I take this medicine? They need to know if you have  any of these conditions: -glucose-6-phosphate deficiencies -heart disease -kidney or liver disease -methemoglobinemia -an unusual or allergic reaction to lidocaine, prilocaine, other medicines, foods, dyes, or preservatives -pregnant or trying to get pregnant -breast-feeding How should I use this medicine? This medicine is for external use only on the skin. Do not take by mouth. Follow the directions on the prescription label. Wash hands before and after use. Do not use more or leave in contact with the skin longer than directed. Do not apply to eyes or open wounds. It can cause irritation and blurred or temporary loss of vision. If this medicine comes in contact with your eyes, immediately rinse the eye with water. Do not touch or rub the eye. Contact your health care provider right away. Talk to your pediatrician regarding the use of this medicine in children. While this medicine may be prescribed for children for selected conditions, precautions do apply. Overdosage: If you think you have taken too much of this medicine contact a poison control center or emergency room at once. NOTE: This medicine is only for you. Do not share this medicine with others. What if I miss a dose? This medicine is usually only applied once prior to each procedure. It must be in contact with the skin for a period of time for it to work. If you applied this medicine later than directed, tell your health care professional before starting the procedure. What may interact  with this medicine? -acetaminophen -chloroquine -dapsone -medicines to control heart rhythm -nitrates like nitroglycerin and nitroprusside -other ointments, creams, or sprays that may contain anesthetic medicine -phenobarbital -phenytoin -quinine -sulfonamides like sulfacetamide, sulfamethoxazole, sulfasalazine and others This list may not describe all possible interactions. Give your health care provider a list of all the medicines, herbs, non-prescription drugs, or dietary supplements you use. Also tell them if you smoke, drink alcohol, or use illegal drugs. Some items may interact with your medicine. What should I watch for while using this medicine? Be careful to avoid injury to the treated area while it is numb and you are not aware of pain. Avoid scratching, rubbing, or exposing the treated area to hot or cold temperatures until complete sensation has returned. The numb feeling will wear off a few hours after applying the cream. What side effects may I notice from receiving this medicine? Side effects that you should report to your doctor or health care professional as soon as possible: -blurred vision -chest pain -difficulty breathing -dizziness -drowsiness -fast or irregular heartbeat -skin rash or itching -swelling of your throat, lips, or face -trembling Side effects that usually do not require medical attention (report to your doctor or health care professional if they continue or are bothersome): -changes in ability to feel hot or cold -redness and swelling at the application site This list may not describe all possible side effects. Call your doctor for medical advice about side effects. You may report side effects to FDA at 1-800-FDA-1088. Where should I keep my medicine? Keep out of reach of children. Store at room temperature between 15 and 30 degrees C (59 and 86 degrees F). Keep container tightly closed. Throw away any unused medicine after the expiration date. NOTE:  This sheet is a summary. It may not cover all possible information. If you have questions about this medicine, talk to your doctor, pharmacist, or health care provider.  2015, Elsevier/Gold Standard. (2008-05-12 17:14:35) Palonosetron Injection What is this medicine? PALONOSETRON (pal oh NOE se tron) is used to prevent nausea and vomiting  caused by chemotherapy. It also helps prevent delayed nausea and vomiting that may occur a few days after your treatment. This medicine may be used for other purposes; ask your health care provider or pharmacist if you have questions. COMMON BRAND NAME(S): Aloxi What should I tell my health care provider before I take this medicine? They need to know if you have any of these conditions: -an unusual or allergic reaction to palonosetron, dolasetron, granisetron, ondansetron, other medicines, foods, dyes, or preservatives -pregnant or trying to get pregnant -breast-feeding How should I use this medicine? This medicine is for infusion into a vein. It is given by a health care professional in a hospital or clinic setting. Talk to your pediatrician regarding the use of this medicine in children. While this drug may be prescribed for children as young as 1 month for selected conditions, precautions do apply. Overdosage: If you think you have taken too much of this medicine contact a poison control center or emergency room at once. NOTE: This medicine is only for you. Do not share this medicine with others. What if I miss a dose? This does not apply. What may interact with this medicine? -certain medicines for depression, anxiety, or psychotic disturbances -fentanyl -linezolid -MAOIs like Carbex, Eldepryl, Marplan, Nardil, and Parnate -methylene blue (injected into a vein) -tramadol This list may not describe all possible interactions. Give your health care provider a list of all the medicines, herbs, non-prescription drugs, or dietary supplements you use. Also  tell them if you smoke, drink alcohol, or use illegal drugs. Some items may interact with your medicine. What should I watch for while using this medicine? Your condition will be monitored carefully while you are receiving this medicine. What side effects may I notice from receiving this medicine? Side effects that you should report to your doctor or health care professional as soon as possible: -allergic reactions like skin rash, itching or hives, swelling of the face, lips, or tongue -breathing problems -confusion -dizziness -fast, irregular heartbeat -fever and chills -loss of balance or coordination -seizures -sweating -swelling of the hands and feet -tremors -unusually weak or tired Side effects that usually do not require medical attention (report to your doctor or health care professional if they continue or are bothersome): -constipation or diarrhea -headache This list may not describe all possible side effects. Call your doctor for medical advice about side effects. You may report side effects to FDA at 1-800-FDA-1088. Where should I keep my medicine? This drug is given in a hospital or clinic and will not be stored at home. NOTE: This sheet is a summary. It may not cover all possible information. If you have questions about this medicine, talk to your doctor, pharmacist, or health care provider.  2015, Elsevier/Gold Standard. (2013-09-13 10:38:36) Fosaprepitant injection What is this medicine? FOSAPREPITANT (fos ap RE pi tant) is used together with other medicines to prevent nausea and vomiting caused by cancer treatment (chemotherapy). This medicine may be used for other purposes; ask your health care provider or pharmacist if you have questions. COMMON BRAND NAME(S): Emend What should I tell my health care provider before I take this medicine? They need to know if you have any of these conditions: -liver disease -an unusual or allergic reaction to fosaprepitant,  aprepitant, medicines, foods, dyes, or preservatives -pregnant or trying to get pregnant -breast-feeding How should I use this medicine? This medicine is for injection into a vein. It is given by a health care professional in a hospital or clinic  setting. Talk to your pediatrician regarding the use of this medicine in children. Special care may be needed. Overdosage: If you think you have taken too much of this medicine contact a poison control center or emergency room at once. NOTE: This medicine is only for you. Do not share this medicine with others. What if I miss a dose? This does not apply. What may interact with this medicine? Do not take this medicine with any of these medicines: -cisapride -pimozide -ranolazine This medicine may also interact with the following medications: -diltiazem -female hormones, like estrogens or progestins and birth control pills -medicines for fungal infections like ketoconazole and itraconazole -medicines for HIV -medicines for seizures or to control epilepsy like carbamazepine or phenytoin -medicines used for sleep or anxiety disorders like alprazolam, diazepam, or midazolam -nefazodone -paroxetine -rifampin -some chemotherapy medications like etoposide, ifosfamide, vinblastine, vincristine -some antibiotics like clarithromycin, erythromycin, troleandomycin -steroid medicines like dexamethasone or methylprednisolone -tolbutamide -warfarin This list may not describe all possible interactions. Give your health care provider a list of all the medicines, herbs, non-prescription drugs, or dietary supplements you use. Also tell them if you smoke, drink alcohol, or use illegal drugs. Some items may interact with your medicine. What should I watch for while using this medicine? Do not take this medicine if you already have nausea and vomiting. Ask your health care provider what to do if you already have nausea. Birth control pills may not work properly  while you are taking this medicine. Talk to your doctor about using an extra method of birth control. This medicine should not be used continuously for a long time. Visit your doctor or health care professional for regular check-ups. This medicine may change your liver function blood test results. What side effects may I notice from receiving this medicine? Side effects that you should report to your doctor or health care professional as soon as possible: -allergic reactions like skin rash, itching or hives, swelling of the face, lips, or tongue -breathing problems -changes in heart rhythm -high or low blood pressure -pain, redness, or irritation at site where injected -rectal bleeding -serious dizziness or disorientation, confusion -sharp or severe stomach pain -sharp pain in your leg Side effects that usually do not require medical attention (report to your doctor or health care professional if they continue or are bothersome): -constipation or diarrhea -hair loss -headache -hiccups -loss of appetite -nausea -upset stomach -tiredness This list may not describe all possible side effects. Call your doctor for medical advice about side effects. You may report side effects to FDA at 1-800-FDA-1088. Where should I keep my medicine? This drug is given in a hospital or clinic and will not be stored at home. NOTE: This sheet is a summary. It may not cover all possible information. If you have questions about this medicine, talk to your doctor, pharmacist, or health care provider.  2015, Elsevier/Gold Standard. (2009-10-20 12:46:13) Implanted [facility] Guide An implanted port is a type of central line that is placed under the skin. Central lines are used to provide IV access when treatment or nutrition needs to be given through a person's veins. Implanted ports are used for long-term IV access. An implanted port may be placed because:   You need IV medicine that would be irritating to the  small veins in your hands or arms.   You need long-term IV medicines, such as antibiotics.   You need IV nutrition for a long period.   You need frequent blood draws for  lab tests.   You need dialysis.  Implanted ports are usually placed in the chest area, but they can also be placed in the upper arm, the abdomen, or the leg. An implanted port has two main parts:   Reservoir. The reservoir is round and will appear as a small, raised area under your skin. The reservoir is the part where a needle is inserted to give medicines or draw blood.   Catheter. The catheter is a thin, flexible tube that extends from the reservoir. The catheter is placed into a large vein. Medicine that is inserted into the reservoir goes into the catheter and then into the vein.  HOW WILL I CARE FOR MY INCISION SITE? Do not get the incision site wet. Bathe or shower as directed by your health care provider.  HOW IS MY PORT ACCESSED? Special steps must be taken to access the port:   Before the port is accessed, a numbing cream can be placed on the skin. This helps numb the skin over the port site.   Your health care provider uses a sterile technique to access the port.  Your health care provider must put on a mask and sterile gloves.  The skin over your port is cleaned carefully with an antiseptic and allowed to dry.  The port is gently pinched between sterile gloves, and a needle is inserted into the port.  Only "non-coring" port needles should be used to access the port. Once the port is accessed, a blood return should be checked. This helps ensure that the port is in the vein and is not clogged.   If your port needs to remain accessed for a constant infusion, a clear (transparent) bandage will be placed over the needle site. The bandage and needle will need to be changed every week, or as directed by your health care provider.   Keep the bandage covering the needle clean and dry. Do not get it wet.  Follow your health care provider's instructions on how to take a shower or bath while the port is accessed.   If your port does not need to stay accessed, no bandage is needed over the port.  WHAT IS FLUSHING? Flushing helps keep the port from getting clogged. Follow your health care provider's instructions on how and when to flush the port. Ports are usually flushed with saline solution or a medicine called heparin. The need for flushing will depend on how the port is used.   If the port is used for intermittent medicines or blood draws, the port will need to be flushed:   After medicines have been given.   After blood has been drawn.   As part of routine maintenance.   If a constant infusion is running, the port may not need to be flushed.  HOW LONG WILL MY PORT STAY IMPLANTED? The port can stay in for as long as your health care provider thinks it is needed. When it is time for the port to come out, surgery will be done to remove it. The procedure is similar to the one performed when the port was put in.  WHEN SHOULD I SEEK IMMEDIATE MEDICAL CARE? When you have an implanted port, you should seek immediate medical care if:   You notice a bad smell coming from the incision site.   You have swelling, redness, or drainage at the incision site.   You have more swelling or pain at the port site or the surrounding area.   You have  a fever that is not controlled with medicine. Document Released: 11/07/2005 Document Revised: 08/28/2013 Document Reviewed: 07/15/2013 [facility] Patient Information 2015 [place], [place]. This information is not intended to replace advice given to you by your health care provider. Make sure you discuss any questions you have with your health care provider.

## 2014-08-25 ENCOUNTER — Encounter (HOSPITAL_COMMUNITY): Payer: PRIVATE HEALTH INSURANCE | Attending: Hematology

## 2014-08-25 ENCOUNTER — Encounter (HOSPITAL_COMMUNITY): Payer: PRIVATE HEALTH INSURANCE

## 2014-08-25 DIAGNOSIS — C772 Secondary and unspecified malignant neoplasm of intra-abdominal lymph nodes: Secondary | ICD-10-CM | POA: Insufficient documentation

## 2014-08-25 DIAGNOSIS — C3411 Malignant neoplasm of upper lobe, right bronchus or lung: Secondary | ICD-10-CM

## 2014-08-25 DIAGNOSIS — Z9089 Acquired absence of other organs: Secondary | ICD-10-CM | POA: Insufficient documentation

## 2014-08-25 DIAGNOSIS — C7971 Secondary malignant neoplasm of right adrenal gland: Secondary | ICD-10-CM | POA: Insufficient documentation

## 2014-08-25 DIAGNOSIS — J449 Chronic obstructive pulmonary disease, unspecified: Secondary | ICD-10-CM | POA: Insufficient documentation

## 2014-08-25 DIAGNOSIS — C7972 Secondary malignant neoplasm of left adrenal gland: Secondary | ICD-10-CM | POA: Insufficient documentation

## 2014-08-25 DIAGNOSIS — I10 Essential (primary) hypertension: Secondary | ICD-10-CM | POA: Insufficient documentation

## 2014-08-25 DIAGNOSIS — F17210 Nicotine dependence, cigarettes, uncomplicated: Secondary | ICD-10-CM | POA: Insufficient documentation

## 2014-08-25 DIAGNOSIS — E785 Hyperlipidemia, unspecified: Secondary | ICD-10-CM | POA: Insufficient documentation

## 2014-08-25 DIAGNOSIS — K219 Gastro-esophageal reflux disease without esophagitis: Secondary | ICD-10-CM | POA: Insufficient documentation

## 2014-08-25 DIAGNOSIS — C7989 Secondary malignant neoplasm of other specified sites: Secondary | ICD-10-CM | POA: Insufficient documentation

## 2014-08-25 DIAGNOSIS — C781 Secondary malignant neoplasm of mediastinum: Secondary | ICD-10-CM | POA: Insufficient documentation

## 2014-08-25 DIAGNOSIS — Z79899 Other long term (current) drug therapy: Secondary | ICD-10-CM | POA: Insufficient documentation

## 2014-08-25 MED ORDER — FENTANYL 25 MCG/HR TD PT72: 25.0000 ug | MEDICATED_PATCH | TRANSDERMAL | Status: DC

## 2014-08-25 MED ORDER — CYANOCOBALAMIN 1000 MCG/ML IJ SOLN
1000.0000 ug | Freq: Once | INTRAMUSCULAR | Status: AC
  Administered 2014-08-25: 1000 ug via INTRAMUSCULAR

## 2014-08-25 MED ORDER — CYANOCOBALAMIN 1000 MCG/ML IJ SOLN
INTRAMUSCULAR | Status: AC
  Filled 2014-08-25: qty 1

## 2014-08-25 MED ORDER — OXYCODONE HCL 10 MG PO TABS: 10.0000 mg | ORAL_TABLET | ORAL | Status: DC | PRN

## 2014-08-25 NOTE — Progress Notes (Signed)
Melissa Sullivan presents today for injection per MD orders. B12 1055mcg administered SQ in right Upper Arm. Administration without incident. Patient tolerated well.   Chemo teaching done and consent signed for Pemetrexed, Cisplatin, Avastin. Distress screening done. Meds already called into [facility] in [place].

## 2014-08-26 ENCOUNTER — Ambulatory Visit (HOSPITAL_COMMUNITY): Payer: PRIVATE HEALTH INSURANCE

## 2014-08-28 ENCOUNTER — Encounter (HOSPITAL_COMMUNITY): Payer: Self-pay | Admitting: Pharmacy Technician

## 2014-08-29 ENCOUNTER — Other Ambulatory Visit: Payer: Self-pay | Admitting: Radiology

## 2014-09-01 ENCOUNTER — Other Ambulatory Visit (HOSPITAL_COMMUNITY): Payer: Self-pay | Admitting: Hematology and Oncology

## 2014-09-01 ENCOUNTER — Ambulatory Visit (HOSPITAL_COMMUNITY)
Admission: RE | Admit: 2014-09-01 | Discharge: 2014-09-01 | Disposition: A | Discharge status: Home / Self Care | Payer: PRIVATE HEALTH INSURANCE | Source: Ambulatory Visit | Attending: Hematology and Oncology | Admitting: Hematology and Oncology

## 2014-09-01 ENCOUNTER — Encounter (HOSPITAL_COMMUNITY): Payer: Self-pay | Admitting: *Deleted

## 2014-09-01 ENCOUNTER — Encounter (HOSPITAL_COMMUNITY): Payer: Self-pay

## 2014-09-01 DIAGNOSIS — R918 Other nonspecific abnormal finding of lung field: Secondary | ICD-10-CM

## 2014-09-01 DIAGNOSIS — I10 Essential (primary) hypertension: Secondary | ICD-10-CM | POA: Diagnosis not present

## 2014-09-01 DIAGNOSIS — E785 Hyperlipidemia, unspecified: Secondary | ICD-10-CM | POA: Diagnosis not present

## 2014-09-01 DIAGNOSIS — C799 Secondary malignant neoplasm of unspecified site: Secondary | ICD-10-CM | POA: Diagnosis not present

## 2014-09-01 DIAGNOSIS — C3411 Malignant neoplasm of upper lobe, right bronchus or lung: Secondary | ICD-10-CM | POA: Diagnosis not present

## 2014-09-01 DIAGNOSIS — Z452 Encounter for adjustment and management of vascular access device: Secondary | ICD-10-CM | POA: Diagnosis present

## 2014-09-01 DIAGNOSIS — Z79899 Other long term (current) drug therapy: Secondary | ICD-10-CM | POA: Insufficient documentation

## 2014-09-01 HISTORY — PX: PORTACATH PLACEMENT: SHX2246

## 2014-09-01 LAB — PROTIME-INR
INR: 0.99 (ref 0.00–1.49)
Prothrombin Time: 13.2 s (ref 11.6–15.2)

## 2014-09-01 LAB — CBC WITH DIFFERENTIAL/PLATELET
Basophils Absolute: 0 K/uL (ref 0.0–0.1)
Basophils Relative: 0 % (ref 0–1)
Eosinophils Absolute: 0 K/uL (ref 0.0–0.7)
Eosinophils Relative: 0 % (ref 0–5)
HCT: 35.1 % — ABNORMAL LOW (ref 36.0–46.0)
Hemoglobin: 11.7 g/dL — ABNORMAL LOW (ref 12.0–15.0)
Lymphocytes Relative: 9 % — ABNORMAL LOW (ref 12–46)
Lymphs Abs: 1.2 K/uL (ref 0.7–4.0)
MCH: 29.9 pg (ref 26.0–34.0)
MCHC: 33.3 g/dL (ref 30.0–36.0)
MCV: 89.8 fL (ref 78.0–100.0)
Monocytes Absolute: 0.3 K/uL (ref 0.1–1.0)
Monocytes Relative: 2 % — ABNORMAL LOW (ref 3–12)
Neutro Abs: 12.2 K/uL — ABNORMAL HIGH (ref 1.7–7.7)
Neutrophils Relative %: 89 % — ABNORMAL HIGH (ref 43–77)
Platelets: 386 K/uL (ref 150–400)
RBC: 3.91 MIL/uL (ref 3.87–5.11)
RDW: 15.8 % — ABNORMAL HIGH (ref 11.5–15.5)
WBC: 13.8 K/uL — ABNORMAL HIGH (ref 4.0–10.5)

## 2014-09-01 MED ORDER — FENTANYL CITRATE 0.05 MG/ML IJ SOLN
INTRAMUSCULAR | Status: AC | PRN
  Administered 2014-09-01: 50 ug via INTRAVENOUS
  Administered 2014-09-01 (×2): 25 ug via INTRAVENOUS

## 2014-09-01 MED ORDER — HEPARIN SOD (PORK) LOCK FLUSH 100 UNIT/ML IV SOLN
INTRAVENOUS | Status: AC
  Filled 2014-09-01: qty 5

## 2014-09-01 MED ORDER — LIDOCAINE-EPINEPHRINE 2 %-1:100000 IJ SOLN
INTRAMUSCULAR | Status: AC
  Filled 2014-09-01: qty 1

## 2014-09-01 MED ORDER — CEFAZOLIN SODIUM-DEXTROSE 2-3 GM-% IV SOLR
2.0000 g | Freq: Once | INTRAVENOUS | Status: AC
  Administered 2014-09-01: 2 g via INTRAVENOUS

## 2014-09-01 MED ORDER — LIDOCAINE HCL 1 % IJ SOLN
INTRAMUSCULAR | Status: DC
Start: 2014-09-01 — End: 2014-09-02
  Filled 2014-09-01: qty 20

## 2014-09-01 MED ORDER — MIDAZOLAM HCL 2 MG/2ML IJ SOLN
INTRAMUSCULAR | Status: AC | PRN
  Administered 2014-09-01: 0.5 mg via INTRAVENOUS
  Administered 2014-09-01 (×2): 1 mg via INTRAVENOUS

## 2014-09-01 MED ORDER — FENTANYL CITRATE 0.05 MG/ML IJ SOLN
INTRAMUSCULAR | Status: AC
  Filled 2014-09-01: qty 6

## 2014-09-01 MED ORDER — MIDAZOLAM HCL 2 MG/2ML IJ SOLN
INTRAMUSCULAR | Status: AC
  Filled 2014-09-01: qty 6

## 2014-09-01 MED ORDER — HEPARIN SOD (PORK) LOCK FLUSH 100 UNIT/ML IV SOLN
INTRAVENOUS | Status: AC | PRN
  Administered 2014-09-01: 500 [IU]

## 2014-09-01 MED ORDER — SODIUM CHLORIDE 0.9 % IV SOLN
INTRAVENOUS | Status: DC
  Administered 2014-09-01: 10:00:00 via INTRAVENOUS

## 2014-09-01 MED ORDER — CEFAZOLIN SODIUM-DEXTROSE 2-3 GM-% IV SOLR
INTRAVENOUS | Status: AC
  Administered 2014-09-01: 2 g via INTRAVENOUS
  Filled 2014-09-01: qty 50

## 2014-09-01 NOTE — Discharge Instructions (Signed)
Implanted Port Insertion, Care After °Refer to this sheet in the next few weeks. These instructions provide you with information on caring for yourself after your procedure. Your health care provider may also give you more specific instructions. Your treatment has been planned according to current medical practices, but problems sometimes occur. Call your health care provider if you have any problems or questions after your procedure. °WHAT TO EXPECT AFTER THE PROCEDURE °After your procedure, it is typical to have the following:  °· Discomfort at the port insertion site. Ice packs to the area will help. °· Bruising on the skin over the port. This will subside in 3-4 days. °HOME CARE INSTRUCTIONS °· After your port is placed, you will get a manufacturer's information card. The card has information about your port. Keep this card with you at all times.   °· Know what kind of port you have. There are many types of ports available.   °· Wear a medical alert bracelet in case of an emergency. This can help alert health care workers that you have a port.   °· The port can stay in for as long as your health care provider believes it is necessary.   °· A home health care nurse may give medicines and take care of the port.   °· You or a family member can get special training and directions for giving medicine and taking care of the port at home.   °SEEK MEDICAL CARE IF:  °· Your port does not flush or you are unable to get a blood return.   °· You have a fever or chills. °SEEK IMMEDIATE MEDICAL CARE IF: °· You have new fluid or pus coming from your incision.   °· You notice a bad smell coming from your incision site.   °· You have swelling, pain, or more redness at the incision or port site.   °· You have chest pain or shortness of breath. °Document Released: 08/28/2013 Document Revised: 11/12/2013 Document Reviewed: 08/28/2013 °ExitCare® Patient Information ©2015 [employer]. This information is not intended to replace  advice given to you by your health care provider. Make sure you discuss any questions you have with your health care provider. °Implanted Port Home Guide °An implanted port is a type of central line that is placed under the skin. Central lines are used to provide IV access when treatment or nutrition needs to be given through a person's veins. Implanted ports are used for long-term IV access. An implanted port may be placed because:  °· You need IV medicine that would be irritating to the small veins in your hands or arms.   °· You need long-term IV medicines, such as antibiotics.   °· You need IV nutrition for a long period.   °· You need frequent blood draws for lab tests.   °· You need dialysis.   °Implanted ports are usually placed in the chest area, but they can also be placed in the upper arm, the abdomen, or the leg. An implanted port has two main parts:  °· Reservoir. The reservoir is round and will appear as a small, raised area under your skin. The reservoir is the part where a needle is inserted to give medicines or draw blood.   °· Catheter. The catheter is a thin, flexible tube that extends from the reservoir. The catheter is placed into a large vein. Medicine that is inserted into the reservoir goes into the catheter and then into the vein.   °HOW WILL I CARE FOR MY INCISION SITE? °Do not get the incision site wet. Bathe or   shower as directed by your health care provider.  °HOW IS MY PORT ACCESSED? °Special steps must be taken to access the port:  °· Before the port is accessed, a numbing cream can be placed on the skin. This helps numb the skin over the port site.   °· Your health care provider uses a sterile technique to access the port. °· Your health care provider must put on a mask and sterile gloves. °· The skin over your port is cleaned carefully with an antiseptic and allowed to dry. °· The port is gently pinched between sterile gloves, and a needle is inserted into the port. °· Only  "non-coring" port needles should be used to access the port. Once the port is accessed, a blood return should be checked. This helps ensure that the port is in the vein and is not clogged.   °· If your port needs to remain accessed for a constant infusion, a clear (transparent) bandage will be placed over the needle site. The bandage and needle will need to be changed every week, or as directed by your health care provider.   °· Keep the bandage covering the needle clean and dry. Do not get it wet. Follow your health care provider's instructions on how to take a shower or bath while the port is accessed.   °· If your port does not need to stay accessed, no bandage is needed over the port.   °WHAT IS FLUSHING? °Flushing helps keep the port from getting clogged. Follow your health care provider's instructions on how and when to flush the port. Ports are usually flushed with saline solution or a medicine called heparin. The need for flushing will depend on how the port is used.  °· If the port is used for intermittent medicines or blood draws, the port will need to be flushed:   °· After medicines have been given.   °· After blood has been drawn.   °· As part of routine maintenance.   °· If a constant infusion is running, the port may not need to be flushed.   °HOW LONG WILL MY PORT STAY IMPLANTED? °The port can stay in for as long as your health care provider thinks it is needed. When it is time for the port to come out, surgery will be done to remove it. The procedure is similar to the one performed when the port was put in.  °WHEN SHOULD I SEEK IMMEDIATE MEDICAL CARE? °When you have an implanted port, you should seek immediate medical care if:  °· You notice a bad smell coming from the incision site.   °· You have swelling, redness, or drainage at the incision site.   °· You have more swelling or pain at the port site or the surrounding area.   °· You have a fever that is not controlled with medicine. °Document  Released: 11/07/2005 Document Revised: 08/28/2013 Document Reviewed: 07/15/2013 °ExitCare® Patient Information ©2015 [employer]. This information is not intended to replace advice given to you by your health care provider. Make sure you discuss any questions you have with your health care provider.Conscious Sedation, Adult, Care After °Refer to this sheet in the next few weeks. These instructions provide you with information on caring for yourself after your procedure. Your health care provider may also give you more specific instructions. Your treatment has been planned according to current medical practices, but problems sometimes occur. Call your health care provider if you have any problems or questions after your procedure. °WHAT TO EXPECT AFTER THE PROCEDURE  °After your procedure: °·   You may feel sleepy, clumsy, and have poor balance for several hours. °· Vomiting may occur if you eat too soon after the procedure. °HOME CARE INSTRUCTIONS °· Do not participate in any activities where you could become injured for at least 24 hours. Do not: °¨ Drive. °¨ Swim. °¨ Ride a bicycle. °¨ Operate heavy machinery. °¨ Cook. °¨ Use power tools. °¨ Climb ladders. °¨ Work from a high place. °· Do not make important decisions or sign legal documents until you are improved. °· If you vomit, drink water, juice, or soup when you can drink without vomiting. Make sure you have little or no nausea before eating solid foods. °· Only take over-the-counter or prescription medicines for pain, discomfort, or fever as directed by your health care provider. °· Make sure you and your family fully understand everything about the medicines given to you, including what side effects may occur. °· You should not drink alcohol, take sleeping pills, or take medicines that cause drowsiness for at least 24 hours. °· If you smoke, do not smoke without supervision. °· If you are feeling better, you may resume normal activities 24 hours after you  were sedated. °· Keep all appointments with your health care provider. °SEEK MEDICAL CARE IF: °· Your skin is pale or bluish in color. °· You continue to feel nauseous or vomit. °· Your pain is getting worse and is not helped by medicine. °· You have bleeding or swelling. °· You are still sleepy or feeling clumsy after 24 hours. °SEEK IMMEDIATE MEDICAL CARE IF: °· You develop a rash. °· You have difficulty breathing. °· You develop any type of allergic problem. °· You have a fever. °MAKE SURE YOU: °· Understand these instructions. °· Will watch your condition. °· Will get help right away if you are not doing well or get worse. °Document Released: 08/28/2013 Document Reviewed: 08/28/2013 °ExitCare® Patient Information ©2015 [employer]. This information is not intended to replace advice given to you by your health care provider. Make sure you discuss any questions you have with your health care provider. ° °

## 2014-09-01 NOTE — Procedures (Signed)
Interventional Radiology Procedure Note  Procedure: Placement of a left IJ approach single lumen PowerPort.  Tip is positioned at the superior cavoatrial junction and catheter is ready for immediate use.  Complications: No immediate Recommendations:  - Ok to shower tomorrow - Do not submerge for 7 days - Routine line care   Signed,  Dulcy Fanny. Earleen Newport, DO

## 2014-09-01 NOTE — H&P (Signed)
Chief Complaint: "I am here for a port."  Referring Physician(s): Formanek,Gregory  History of Present Illness: Melissa Sullivan is a 58 y.o. female with a right apical lung mass s/p biopsy consistent with adenocarcinoma and will be having chemotherapy and local radiation. She is scheduled today for port a catheter. She denies any chest pain, change in chronic shortness of breath or palpitations. She denies any active signs of bleeding or excessive bruising. She denies any recent fever or chills and denies any urinary symptoms. The patient denies any history of sleep apnea or chronic oxygen use. She has previously tolerated sedation without complications.    Past Medical History  Diagnosis Date  . Hypertension   . Reflux   . Hyperlipidemia   . Lung mass 07/28/2014  . Adrenal mass, right 07/28/2014    Past Surgical History  Procedure Laterality Date  . Appendectomy    . Lung biopsy Right 07/2014    CT guided    Allergies: Review of patient's allergies indicates no known allergies.  Medications: Prior to Admission medications   Medication Sig Start Date End Date Taking? Authorizing Provider  dexamethasone (DECADRON) 4 MG tablet The day before, day of, and day after chemo take 2 tabs in the am and 2 tabs in the pm. 08/19/14  Yes Farrel Gobble, MD  fentaNYL (DURAGESIC - DOSED MCG/HR) 25 MCG/HR patch Place 25 mcg onto the skin every 3 (three) days.   Yes Historical Provider, MD  folic acid (FOLVITE) 1 MG tablet Take 1 mg by mouth every morning.   Yes Historical Provider, MD  hydrochlorothiazide (MICROZIDE) 12.5 MG capsule Take 12.5 mg by mouth every morning.    Yes Historical Provider, MD  ibuprofen (ADVIL,MOTRIN) 200 MG tablet Take 200-800 mg by mouth once as needed for headache or mild pain.   Yes Historical Provider, MD  Multiple Vitamin (MULTIVITAMIN WITH MINERALS) TABS tablet Take 1 tablet by mouth every morning.    Yes Historical Provider, MD  omeprazole (PRILOSEC) 20 MG  capsule Take 20 mg by mouth every morning.    Yes Historical Provider, MD  Oxycodone HCl 10 MG TABS Take 10-20 mg by mouth every 6 (six) hours as needed (pain.).   Yes Historical Provider, MD  senna (SENOKOT) 8.6 MG tablet Take 1 tablet by mouth at bedtime.   Yes Historical Provider, MD  simvastatin (ZOCOR) 40 MG tablet Take 40 mg by mouth every morning.    Yes Historical Provider, MD  lidocaine-prilocaine (EMLA) cream Apply a quarter size amount to port site 1 hour prior to chemo. Do not rub in. Cover with plastic wrap. 08/19/14   Farrel Gobble, MD  metoCLOPramide (REGLAN) 5 MG tablet Starting the day after chemo, take 1 tab four times a day x 2 days. Then may take 1 tab four times a day IF needed for nausea/vomiting. 08/19/14   Farrel Gobble, MD  prochlorperazine (COMPAZINE) 10 MG tablet Starting the day after chemo, take 1 tab four times a day x 2 days. Then may take 1 tab four times a day IF needed for nausea/vomiting. 08/19/14   Farrel Gobble, MD    Family History  Problem Relation Age of Onset  . Cancer Sister     History   Social History  . Marital Status: Married    Spouse Name: N/A    Number of Children: N/A  . Years of Education: N/A   Social History Main Topics  . Smoking status: Light Tobacco Smoker -- 0.30 packs/day  Types: Cigarettes  . Smokeless tobacco: None  . Alcohol Use: No  . Drug Use: No  . Sexual Activity: None   Other Topics Concern  . None   Social History Narrative  . None    Review of Systems: A 12 point ROS discussed and pertinent positives are indicated in the HPI above.  All other systems are negative.  Review of Systems  Vital Signs: BP 111/65  Pulse 83  Temp(Src) 98.6 F (37 C) (Oral)  Resp 16  Ht 4\' 11"  (1.499 m)  Wt 92 lb (41.731 kg)  BMI 18.57 kg/m2  SpO2 99%  Physical Exam  Constitutional: She is oriented to person, place, and time. No distress.  HENT:  Head: Normocephalic and atraumatic.  Neck: No tracheal deviation  present.  Cardiovascular: Normal rate and regular rhythm.  Exam reveals no gallop and no friction rub.   No murmur heard. Pulmonary/Chest: Effort normal and breath sounds normal. No respiratory distress. She has no wheezes. She has no rales.  Abdominal: Soft. Bowel sounds are normal.  Neurological: She is alert and oriented to person, place, and time.  Skin: She is not diaphoretic.  Psychiatric: She has a normal mood and affect. Her behavior is normal. Thought content normal.    Imaging: Nm Pet Image Initial (pi) Skull Base To Thigh  08/08/2014   CLINICAL DATA:  Initial treatment strategy for lung mass and right adrenal mass.  EXAM: NUCLEAR MEDICINE PET SKULL BASE TO THIGH  TECHNIQUE: 5.2 MCi F-18 FDG was injected intravenously. Full-ring PET imaging was performed from the skull base to thigh after the radiotracer. CT data was obtained and used for attenuation correction and anatomic localization.  FASTING BLOOD GLUCOSE:  Value: 108 mg/dl  COMPARISON:  Chest CT 07/28/2014.  FINDINGS: NECK  Circumferential soft tissue thickening in the region of the nasopharynx demonstrates hypermetabolism (SUVmax = 7.9). Left level IIa lymph node measuring 11 x 14 mm (image 27 of series 4) is hypermetabolic (SUVmax = 95.6). There is also a right level IIa lymph node measuring 9 x 14 mm (image 27 of series 4) which demonstrates low-level hypermetabolism (SUVmax = 3.1). Focus of hypermetabolism in the angle of the mandible on the left (SUVmax = 6.3)may relate to underlying dental disease.  CHEST  Again noted is a large mass in the apex of the right upper lobe, which currently measures 9.6 x 7.5 cm and is diffusely hypermetabolic (SUVmax = 46). This lesion directly invades the mediastinum and is causing mass effect upon the adjacent proximal trachea which is slightly displaced and deformed to the left. There is also definitive evidence of chest wall invasion, predominantly related to gross bony destruction of the posterior  aspect of the right second rib. Postobstructive changes are noted in the adjacent right upper lobe lung parenchyma. No other definite pulmonary nodules. There is a small amount of mild cylindrical bronchiectasis with peribronchovascular micronodularity in the anterior aspect of the left upper lobe, compatible with areas of mucoid impaction within terminal bronchioles. The only mediastinal adenopathy is some right retrocrural lymphadenopathy measuring up to 2.4 x 1.3 cm, which is hypermetabolic (SUVmax = 38.7). Trace right pleural fluid collection lying dependently. Mild to moderate centrilobular and paraseptal emphysema is noted.  ABDOMEN/PELVIS  Large hypermetabolic (SUVmax = 56.4)PPIRJ adrenal mass is noted measuring approximately 6.6 x 4.2 cm (image 99 of series 4). There is a smaller left adrenal nodule as well (image 96 of series 4) which currently measures 1.9 x 1.3 cm which is hypermetabolic (  SUVmax = 20.8). In addition, there is extensive amorphous retroperitoneal lymphadenopathy which is hypermetabolic (SUVmax = 93.2-35.5). No abnormal hypermetabolic activity within the liver, pancreas or spleen. Atherosclerosis throughout the abdominal and pelvic vasculature, without evidence of aneurysm. Numerous colonic diverticulae are noted, without surrounding inflammatory changes to suggest an acute diverticulitis at this time. Uterus and ovaries are unremarkable in appearance. Postoperative changes of right inguinal herniorrhaphy are noted.  SKELETON  Hypermetabolism in the lytic lesion in the posterior aspect of the right second rib, as well as the adjacent right T2 transverse process. No other definite skeletal lesions are noted.  IMPRESSION: 1. Large hypermetabolic right apical mass with evidence of direct chest wall and mediastinal invasion, right retrocrural lymphadenopathy, extensive retroperitoneal lymphadenopathy, and metastatic lesions to the adrenal glands bilaterally, including a 6.6 x 4.2 cm  hypermetabolic right adrenal mass. Given the recent biopsy diagnosis of poorly differentiated adenocarcinoma from the right apical mass, findings are favored to reflect stage IV lung cancer. 2. However, there is also extensive soft tissue thickening in the posterior nasopharynx with bilateral level IIa lymphadenopathy in the neck. The possibility of a second primary neoplasm in this region warrants consideration. Clinical correlation is recommended. 3. There is also a small focus of hypermetabolism in the angle of the mandible on the left side, which is favored to be related to underlying dental disease, although a metastatic lesion in this region is difficult to exclude. 4. Additional findings, as above.   Electronically Signed   By: Vinnie Langton M.D.   On: 08/08/2014 11:12    Labs:  CBC:  Recent Labs  07/28/14 1253 08/01/14 0732 08/14/14 1510 09/01/14 1000  WBC 14.5* 11.2* 11.0* 13.8*  HGB 11.8* 11.2* 11.3* 11.7*  HCT 34.8* 33.4* 34.1* 35.1*  PLT 413* 376 473* 386    COAGS:  Recent Labs  08/01/14 0732 09/01/14 1000  INR 1.08 0.99  APTT 31  --     BMP:  Recent Labs  07/28/14 1253 08/14/14 1510  NA 140 137  K 2.8* 3.7  CL 96 99  CO2 26 25  GLUCOSE 81 113*  BUN 11 7  CALCIUM 9.9 10.0  CREATININE 0.72 0.61  GFRNONAA >90 >90  GFRAA >90 >90    LIVER FUNCTION TESTS:  Recent Labs  08/14/14 1510  BILITOT 0.2*  AST 16  ALT 6  ALKPHOS 105  PROT 8.8*  ALBUMIN 3.3*    Assessment and Plan: Right apical lung mass, pancoast 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  S/p biopsy consistent with adenocarcinoma will be having chemotherapy and local radiation. Scheduled today for port a catheter with moderate sedation, D/w Dr. Earleen Newport will proceed with left sided port.  Patient has been NPO, no blood thinners taken, afebrile Leukocytosis on decadron Risks and Benefits discussed with the patient. All of the patient's questions were answered,  patient is agreeable to proceed. Consent signed and in chart. COPD    Signed: Hedy Jacob 09/01/2014, 10:50 AM

## 2014-09-02 ENCOUNTER — Encounter (HOSPITAL_COMMUNITY): Payer: Self-pay

## 2014-09-02 ENCOUNTER — Ambulatory Visit (HOSPITAL_COMMUNITY): Payer: PRIVATE HEALTH INSURANCE

## 2014-09-02 ENCOUNTER — Encounter (HOSPITAL_BASED_OUTPATIENT_CLINIC_OR_DEPARTMENT_OTHER): Payer: PRIVATE HEALTH INSURANCE

## 2014-09-02 ENCOUNTER — Encounter: Payer: Self-pay | Admitting: *Deleted

## 2014-09-02 VITALS — BP 151/79 | HR 69 | Temp 97.2°F | Resp 16 | Wt 93.8 lb

## 2014-09-02 DIAGNOSIS — C7971 Secondary malignant neoplasm of right adrenal gland: Secondary | ICD-10-CM | POA: Diagnosis not present

## 2014-09-02 DIAGNOSIS — C3411 Malignant neoplasm of upper lobe, right bronchus or lung: Secondary | ICD-10-CM

## 2014-09-02 DIAGNOSIS — Z5112 Encounter for antineoplastic immunotherapy: Secondary | ICD-10-CM

## 2014-09-02 DIAGNOSIS — J449 Chronic obstructive pulmonary disease, unspecified: Secondary | ICD-10-CM | POA: Diagnosis not present

## 2014-09-02 DIAGNOSIS — C7989 Secondary malignant neoplasm of other specified sites: Secondary | ICD-10-CM | POA: Diagnosis not present

## 2014-09-02 DIAGNOSIS — C772 Secondary and unspecified malignant neoplasm of intra-abdominal lymph nodes: Secondary | ICD-10-CM | POA: Diagnosis not present

## 2014-09-02 DIAGNOSIS — Z9089 Acquired absence of other organs: Secondary | ICD-10-CM | POA: Diagnosis not present

## 2014-09-02 DIAGNOSIS — K219 Gastro-esophageal reflux disease without esophagitis: Secondary | ICD-10-CM | POA: Diagnosis not present

## 2014-09-02 DIAGNOSIS — C781 Secondary malignant neoplasm of mediastinum: Secondary | ICD-10-CM | POA: Diagnosis not present

## 2014-09-02 DIAGNOSIS — F17210 Nicotine dependence, cigarettes, uncomplicated: Secondary | ICD-10-CM | POA: Diagnosis not present

## 2014-09-02 DIAGNOSIS — Z5111 Encounter for antineoplastic chemotherapy: Secondary | ICD-10-CM

## 2014-09-02 DIAGNOSIS — C7972 Secondary malignant neoplasm of left adrenal gland: Secondary | ICD-10-CM | POA: Diagnosis not present

## 2014-09-02 DIAGNOSIS — Z79899 Other long term (current) drug therapy: Secondary | ICD-10-CM | POA: Diagnosis not present

## 2014-09-02 DIAGNOSIS — E785 Hyperlipidemia, unspecified: Secondary | ICD-10-CM | POA: Diagnosis not present

## 2014-09-02 DIAGNOSIS — I10 Essential (primary) hypertension: Secondary | ICD-10-CM | POA: Diagnosis not present

## 2014-09-02 LAB — URINALYSIS, DIPSTICK ONLY
Bilirubin Urine: NEGATIVE
Glucose, UA: NEGATIVE mg/dL
HGB URINE DIPSTICK: NEGATIVE
Ketones, ur: NEGATIVE mg/dL
Leukocytes, UA: NEGATIVE
Nitrite: NEGATIVE
Protein, ur: NEGATIVE mg/dL
Urobilinogen, UA: 0.2 mg/dL (ref 0.0–1.0)
pH: 6.5 (ref 5.0–8.0)

## 2014-09-02 LAB — COMPREHENSIVE METABOLIC PANEL
ALT: 8 U/L (ref 0–35)
ANION GAP: 14 (ref 5–15)
AST: 12 U/L (ref 0–37)
Albumin: 3.1 g/dL — ABNORMAL LOW (ref 3.5–5.2)
Alkaline Phosphatase: 104 U/L (ref 39–117)
BILIRUBIN TOTAL: 0.2 mg/dL — AB (ref 0.3–1.2)
BUN: 13 mg/dL (ref 6–23)
CHLORIDE: 96 meq/L (ref 96–112)
CO2: 26 meq/L (ref 19–32)
Calcium: 10.3 mg/dL (ref 8.4–10.5)
Creatinine, Ser: 0.53 mg/dL (ref 0.50–1.10)
GFR calc non Af Amer: >90 mL/min (ref >90)
Glucose, Bld: 151 mg/dL — ABNORMAL HIGH (ref 70–99)
POTASSIUM: 3.9 meq/L (ref 3.7–5.3)
SODIUM: 136 meq/L — AB (ref 137–147)
Total Protein: 8.5 g/dL — ABNORMAL HIGH (ref 6.0–8.3)

## 2014-09-02 MED ORDER — DEXAMETHASONE SODIUM PHOSPHATE 20 MG/5ML IJ SOLN: 12.0000 mg | Freq: Once | INTRAMUSCULAR | Status: DC

## 2014-09-02 MED ORDER — FENTANYL 25 MCG/HR TD PT72: 25.0000 ug | MEDICATED_PATCH | TRANSDERMAL | Status: DC

## 2014-09-02 MED ORDER — SODIUM CHLORIDE 0.9 % IV SOLN
Freq: Once | INTRAVENOUS | Status: AC
  Administered 2014-09-02: 09:00:00 via INTRAVENOUS

## 2014-09-02 MED ORDER — SODIUM CHLORIDE 0.9 % IV SOLN
Freq: Once | INTRAVENOUS | Status: AC
  Administered 2014-09-02: 12:00:00 via INTRAVENOUS
  Filled 2014-09-02: qty 5

## 2014-09-02 MED ORDER — POTASSIUM CHLORIDE 2 MEQ/ML IV SOLN
Freq: Once | INTRAVENOUS | Status: AC
  Administered 2014-09-02: 09:00:00 via INTRAVENOUS
  Filled 2014-09-02: qty 10

## 2014-09-02 MED ORDER — HEPARIN SOD (PORK) LOCK FLUSH 100 UNIT/ML IV SOLN
500.0000 [IU] | Freq: Once | INTRAVENOUS | Status: AC | PRN
  Administered 2014-09-02: 500 [IU]

## 2014-09-02 MED ORDER — SODIUM CHLORIDE 0.9 % IV SOLN
400.0000 mg/m2 | Freq: Once | INTRAVENOUS | Status: AC
  Administered 2014-09-02: 525 mg via INTRAVENOUS
  Filled 2014-09-02: qty 21

## 2014-09-02 MED ORDER — SODIUM CHLORIDE 0.9 % IV SOLN: 150.0000 mg | Freq: Once | INTRAVENOUS | Status: DC

## 2014-09-02 MED ORDER — SODIUM CHLORIDE 0.9 % IV SOLN
75.0000 mg/m2 | Freq: Once | INTRAVENOUS | Status: AC
  Administered 2014-09-02: 101 mg via INTRAVENOUS
  Filled 2014-09-02: qty 101

## 2014-09-02 MED ORDER — SODIUM CHLORIDE 0.9 % IV SOLN
15.0000 mg/kg | Freq: Once | INTRAVENOUS | Status: AC
  Administered 2014-09-02: 650 mg via INTRAVENOUS
  Filled 2014-09-02: qty 26

## 2014-09-02 MED ORDER — PALONOSETRON HCL INJECTION 0.25 MG/5ML
0.2500 mg | Freq: Once | INTRAVENOUS | Status: AC
Start: 2014-09-02 — End: 2014-09-02
  Administered 2014-09-02: 0.25 mg via INTRAVENOUS
  Filled 2014-09-02: qty 5

## 2014-09-02 MED ORDER — SODIUM CHLORIDE 0.9 % IJ SOLN
10.0000 mL | INTRAMUSCULAR | Status: DC | PRN
  Administered 2014-09-02: 10 mL

## 2014-09-02 MED ORDER — OXYCODONE HCL 10 MG PO TABS: 10.0000 mg | ORAL_TABLET | Freq: Four times a day (QID) | ORAL | Status: DC | PRN

## 2014-09-02 NOTE — Progress Notes (Signed)
Tolerated well

## 2014-09-02 NOTE — Progress Notes (Signed)
[facility] Clinical Social Work  Clinical Social Work was referred by nurse and patient navigator for assessment of psychosocial needs due to first chemo today. Clinical Social Worker met with patient, her mother and sister at [facility] during first chemo to offer support and assess for needs.  CSW introduced self, explained role of CSW and provided information sheet regarding role of CSW. Pt reports to be coping well currently with her treatment plan and reports to have good supports in place to assist her. Pt denies any concerns at this time and is aware of how to reach out to CSW as needed.   Clinical Social Work interventions: CSW role education   Grier Hock, LCSW [facility] Tuesdays 8:30-1pm Wednesdays 8:30-12pm  [phone]  

## 2014-09-02 NOTE — Patient Instructions (Signed)
..  [facility] Discharge Instructions for Patients Receiving Chemotherapy  Today you received the following chemotherapy agents cisplatin, pemetrexed, and avastin    To help prevent nausea and vomiting after your treatment, we encourage you to take your nausea medication  If you develop nausea and vomiting, or diarrhea that is not controlled by your medication, call the clinic.  The clinic phone number is [phone]. Office hours are Monday-Friday 8:30am-5:00pm.  BELOW ARE SYMPTOMS THAT SHOULD BE REPORTED IMMEDIATELY:  *FEVER GREATER THAN 101.0 F  *CHILLS WITH OR WITHOUT FEVER  NAUSEA AND VOMITING THAT IS NOT CONTROLLED WITH YOUR NAUSEA MEDICATION  *UNUSUAL SHORTNESS OF BREATH  *UNUSUAL BRUISING OR BLEEDING  TENDERNESS IN MOUTH AND THROAT WITH OR WITHOUT PRESENCE OF ULCERS  *URINARY PROBLEMS  *BOWEL PROBLEMS  UNUSUAL RASH Items with * indicate a potential emergency and should be followed up as soon as possible. If you have an emergency after office hours please contact your primary care physician or go to the nearest emergency department.  Please call the clinic during office hours if you have any questions or concerns.   You may also contact the Patient Navigator at [phone] should you have any questions or need assistance in obtaining follow up care. _____________________________________________________________________ Have you asked about our STAR program?    STAR stands for Survivorship Training and Rehabilitation, and this is a nationally recognized cancer care program that focuses on survivorship and rehabilitation.  Cancer and cancer treatments may cause problems, such as, pain, making you feel tired and keeping you from doing the things that you need or want to do. Cancer rehabilitation can help. Our goal is to reduce these troubling effects and help you have the best quality of life possible.  You may receive a survey from a nurse that asks  questions about your current state of health.  Based on the survey results, all eligible patients will be referred to the [facility] program for an evaluation so we can better serve you! A frequently asked questions sheet is available upon request.

## 2014-09-02 NOTE — Progress Notes (Signed)
Up to restroom x 2 with good urinary output

## 2014-09-03 ENCOUNTER — Telehealth (HOSPITAL_COMMUNITY): Payer: Self-pay | Admitting: *Deleted

## 2014-09-03 NOTE — Telephone Encounter (Signed)
24 follow up call: Patient is doing good. She vomited approximately 1/2 cup of peaches. She states that after that she was fine and has been fine since. I asked patient to call me on Friday for a progress report and she said she would. I did put her on my calendar as a reminder to call her on Friday.

## 2014-09-05 ENCOUNTER — Telehealth (HOSPITAL_COMMUNITY): Payer: Self-pay | Admitting: *Deleted

## 2014-09-05 NOTE — Telephone Encounter (Signed)
I spoke with patient this afternoon @ 1440. Patient had one loose stool and so she took an Imodium. She has not had any loose stools since then. No nausea/vomiting. Patient is doing good. I told patient I would call her again on Monday. She was thankful for the phone call.

## 2014-09-05 NOTE — Telephone Encounter (Signed)
I called to check on patient today and got her vm. I could not leave a message because her vm was full.

## 2014-09-08 ENCOUNTER — Telehealth (HOSPITAL_COMMUNITY): Payer: Self-pay | Admitting: *Deleted

## 2014-09-08 NOTE — Telephone Encounter (Signed)
Patient has absolutely no complaints. She is doing great. She ate 2 bowls of collard greens on Saturday. Patient is drinking plenty of fluids. Bowels are moving. No more nausea since last week. Patient coming in tomorrow for 1 week f/u visit.

## 2014-09-09 ENCOUNTER — Encounter (HOSPITAL_BASED_OUTPATIENT_CLINIC_OR_DEPARTMENT_OTHER): Payer: PRIVATE HEALTH INSURANCE

## 2014-09-09 ENCOUNTER — Encounter (HOSPITAL_COMMUNITY): Payer: Self-pay

## 2014-09-09 ENCOUNTER — Encounter (HOSPITAL_COMMUNITY): Payer: PRIVATE HEALTH INSURANCE

## 2014-09-09 VITALS — BP 112/68 | HR 92 | Temp 99.0°F | Resp 16 | Wt 90.0 lb

## 2014-09-09 DIAGNOSIS — M5412 Radiculopathy, cervical region: Secondary | ICD-10-CM

## 2014-09-09 DIAGNOSIS — C772 Secondary and unspecified malignant neoplasm of intra-abdominal lymph nodes: Secondary | ICD-10-CM

## 2014-09-09 DIAGNOSIS — C7970 Secondary malignant neoplasm of unspecified adrenal gland: Secondary | ICD-10-CM

## 2014-09-09 DIAGNOSIS — H9319 Tinnitus, unspecified ear: Secondary | ICD-10-CM

## 2014-09-09 DIAGNOSIS — C781 Secondary malignant neoplasm of mediastinum: Secondary | ICD-10-CM

## 2014-09-09 DIAGNOSIS — C3411 Malignant neoplasm of upper lobe, right bronchus or lung: Secondary | ICD-10-CM

## 2014-09-09 DIAGNOSIS — J449 Chronic obstructive pulmonary disease, unspecified: Secondary | ICD-10-CM

## 2014-09-09 LAB — CBC WITH DIFFERENTIAL/PLATELET
Basophils Absolute: 0 10*3/uL (ref 0.0–0.1)
Basophils Relative: 0 % (ref 0–1)
Eosinophils Absolute: 0.1 10*3/uL (ref 0.0–0.7)
Eosinophils Relative: 1 % (ref 0–5)
HEMATOCRIT: 33.4 % — AB (ref 36.0–46.0)
Hemoglobin: 11.3 g/dL — ABNORMAL LOW (ref 12.0–15.0)
LYMPHS ABS: 1.7 10*3/uL (ref 0.7–4.0)
LYMPHS PCT: 37 % (ref 12–46)
MCH: 29.4 pg (ref 26.0–34.0)
MCHC: 33.8 g/dL (ref 30.0–36.0)
MCV: 86.8 fL (ref 78.0–100.0)
MONO ABS: 0.3 10*3/uL (ref 0.1–1.0)
Monocytes Relative: 7 % (ref 3–12)
Neutro Abs: 2.7 10*3/uL (ref 1.7–7.7)
Neutrophils Relative %: 55 % (ref 43–77)
PLATELETS: 273 10*3/uL (ref 150–400)
RBC: 3.85 MIL/uL — AB (ref 3.87–5.11)
RDW: 15.1 % (ref 11.5–15.5)
WBC: 4.8 10*3/uL (ref 4.0–10.5)

## 2014-09-09 NOTE — Progress Notes (Signed)
Labs for cbcd

## 2014-09-09 NOTE — Progress Notes (Signed)
[place]  OFFICE PROGRESS NOTE  [place], PA-C [address] [place] [address]  DIAGNOSIS: Cancer of upper lobe of right lung - Plan: CBC with Differential  Chief Complaint  Patient presents with  . Lung Mass    CURRENT THERAPY: Cis-platinum/pemetrexed/Avastin started 09/02/2014.  INTERVAL HISTORY: Melissa Sullivan 58 y.o. female returns for followup after initiation of chemotherapy for stage IV adenocarcinoma of the lung, Pancoast tumor on the right with right brachio-plexopathy and metastases to the mediastinum, chest wall, retroperitoneal lymph nodes, and bilateral adrenal glands. She tolerated the initial treatment extremely well with no episodes of nausea or vomiting. She did develop tinnitus right greater than left without nasal congestion, cough, or sore mouth. She denies any fever, night sweats, and right upper extremity pain is controlled well with fentanyl 50 patch a long period she is not needing any breakthrough medication for pain. L. movements are regular with no lower extremity swelling or redness, PND, orthopnea, palpitations, headache, or seizures. Denies lower extremity paresthesias with only minimal sensory deficit right upper extremity.  MEDICAL HISTORY: Past Medical History  Diagnosis Date  . Hypertension   . Reflux   . Hyperlipidemia   . Lung mass 07/28/2014  . Adrenal mass, right 07/28/2014    INTERIM HISTORY: has Lung mass; Adrenal mass, right; and Cancer of upper lobe of right lung on her problem list.    ALLERGIES:  has No Known Allergies.  MEDICATIONS: has a current medication list which includes the following prescription(s): dexamethasone, fentanyl, folic acid, hydrochlorothiazide, ibuprofen, lidocaine-prilocaine, metoclopramide, omeprazole, oxycodone hcl, prochlorperazine, senna, and simvastatin.  SURGICAL HISTORY:  Past Surgical History  Procedure Laterality Date  . Appendectomy    . Lung  biopsy Right 07/2014    CT guided  . Portacath placement Left 09/01/2014    FAMILY HISTORY: family history includes Cancer in her sister.  SOCIAL HISTORY:  reports that she has been smoking Cigarettes.  She has been smoking about 0.30 packs per day. She has never used smokeless tobacco. She reports that she does not drink alcohol or use illicit drugs.  REVIEW OF SYSTEMS:  Other than that discussed above is noncontributory.  PHYSICAL EXAMINATION: ECOG PERFORMANCE STATUS: 1 - Symptomatic but completely ambulatory  Weight 90 lb (40.824 kg).  GENERAL:alert, no distress and comfortable SKIN: skin color, texture, turgor are normal, no rashes or significant lesions EYES: PERLA; Conjunctiva are pink and non-injected, sclera clear SINUSES: No redness or tenderness over maxillary or ethmoid sinuses OROPHARYNX:no exudate, no erythema on lips, buccal mucosa, or tongue. EARS: No evidence of fluid. NECK: supple, thyroid normal size, non-tender, without nodularity. No masses CHEST: Increased AP diameter with light port in place. LYMPH:  no palpable lymphadenopathy in the cervical, axillary or inguinal LUNGS: clear to auscultation and percussion with normal breathing effort HEART: regular rate & rhythm and no murmurs. ABDOMEN:abdomen soft, non-tender and normal bowel sounds MUSCULOSKELETAL:no cyanosis of digits and no clubbing. Range of motion normal. No upper extremity swelling or redness. NEURO: alert & oriented x 3 with fluent speech, no focal motor/sensory deficits   LABORATORY DATA: Infusion on 09/02/2014  Component Date Value Ref Range Status  . Sodium 09/02/2014 136* 137 - 147 mEq/L Final  . Potassium 09/02/2014 3.9  3.7 - 5.3 mEq/L Final  . Chloride 09/02/2014 96  96 - 112 mEq/L Final  . CO2 09/02/2014 26  19 - 32 mEq/L Final  . Glucose, Bld 09/02/2014 151* 70 -  99 mg/dL Final  . BUN 09/02/2014 13  6 - 23 mg/dL Final  . Creatinine, Ser 09/02/2014 0.53  0.50 - 1.10 mg/dL Final  .  Calcium 09/02/2014 10.3  8.4 - 10.5 mg/dL Final  . Total Protein 09/02/2014 8.5* 6.0 - 8.3 g/dL Final  . Albumin 09/02/2014 3.1* 3.5 - 5.2 g/dL Final  . AST 09/02/2014 12  0 - 37 U/L Final  . ALT 09/02/2014 8  0 - 35 U/L Final  . Alkaline Phosphatase 09/02/2014 104  39 - 117 U/L Final  . Total Bilirubin 09/02/2014 0.2* 0.3 - 1.2 mg/dL Final  . GFR calc non Af Amer 09/02/2014 >90  >90 mL/min Final  . GFR calc Af Amer 09/02/2014 >90  >90 mL/min Final   Comment: (NOTE)                          The eGFR has been calculated using the CKD EPI equation.                          This calculation has not been validated in all clinical situations.                          eGFR's persistently <90 mL/min signify possible Chronic Kidney                          Disease.  . Anion gap 09/02/2014 14  5 - 15 Final  . Specific Gravity, Urine 09/02/2014 <1.005* 1.005 - 1.030 Final  . pH 09/02/2014 6.5  5.0 - 8.0 Final  . Glucose, UA 09/02/2014 NEGATIVE  NEGATIVE mg/dL Final  . Hgb urine dipstick 09/02/2014 NEGATIVE  NEGATIVE Final  . Bilirubin Urine 09/02/2014 NEGATIVE  NEGATIVE Final  . Ketones, ur 09/02/2014 NEGATIVE  NEGATIVE mg/dL Final  . Protein, ur 09/02/2014 NEGATIVE  NEGATIVE mg/dL Final  . Urobilinogen, UA 09/02/2014 0.2  0.0 - 1.0 mg/dL Final  . Nitrite 09/02/2014 NEGATIVE  NEGATIVE Final  . Leukocytes, UA 09/02/2014 NEGATIVE  NEGATIVE Final  Hospital Outpatient Visit on 09/01/2014  Component Date Value Ref Range Status  . WBC 09/01/2014 13.8* 4.0 - 10.5 K/uL Final  . RBC 09/01/2014 3.91  3.87 - 5.11 MIL/uL Final  . Hemoglobin 09/01/2014 11.7* 12.0 - 15.0 g/dL Final  . HCT 09/01/2014 35.1* 36.0 - 46.0 % Final  . MCV 09/01/2014 89.8  78.0 - 100.0 fL Final  . MCH 09/01/2014 29.9  26.0 - 34.0 pg Final  . MCHC 09/01/2014 33.3  30.0 - 36.0 g/dL Final  . RDW 09/01/2014 15.8* 11.5 - 15.5 % Final  . Platelets 09/01/2014 386  150 - 400 K/uL Final  . Neutrophils Relative % 09/01/2014 89* 43 - 77 %  Final  . Neutro Abs 09/01/2014 12.2* 1.7 - 7.7 K/uL Final  . Lymphocytes Relative 09/01/2014 9* 12 - 46 % Final  . Lymphs Abs 09/01/2014 1.2  0.7 - 4.0 K/uL Final  . Monocytes Relative 09/01/2014 2* 3 - 12 % Final  . Monocytes Absolute 09/01/2014 0.3  0.1 - 1.0 K/uL Final  . Eosinophils Relative 09/01/2014 0  0 - 5 % Final  . Eosinophils Absolute 09/01/2014 0.0  0.0 - 0.7 K/uL Final  . Basophils Relative 09/01/2014 0  0 - 1 % Final  . Basophils Absolute 09/01/2014 0.0  0.0 - 0.1 K/uL Final  .  Prothrombin Time 09/01/2014 13.2  11.6 - 15.2 seconds Final  . INR 09/01/2014 0.99  0.00 - 1.49 Final  Office Visit on 08/14/2014  Component Date Value Ref Range Status  . WBC 08/14/2014 11.0* 4.0 - 10.5 K/uL Final  . RBC 08/14/2014 3.80* 3.87 - 5.11 MIL/uL Final  . Hemoglobin 08/14/2014 11.3* 12.0 - 15.0 g/dL Final  . HCT 08/14/2014 34.1* 36.0 - 46.0 % Final  . MCV 08/14/2014 89.7  78.0 - 100.0 fL Final  . MCH 08/14/2014 29.7  26.0 - 34.0 pg Final  . MCHC 08/14/2014 33.1  30.0 - 36.0 g/dL Final  . RDW 08/14/2014 16.6* 11.5 - 15.5 % Final  . Platelets 08/14/2014 473* 150 - 400 K/uL Final  . Neutrophils Relative % 08/14/2014 57  43 - 77 % Final  . Neutro Abs 08/14/2014 6.3  1.7 - 7.7 K/uL Final  . Lymphocytes Relative 08/14/2014 32  12 - 46 % Final  . Lymphs Abs 08/14/2014 3.5  0.7 - 4.0 K/uL Final  . Monocytes Relative 08/14/2014 9  3 - 12 % Final  . Monocytes Absolute 08/14/2014 0.9  0.1 - 1.0 K/uL Final  . Eosinophils Relative 08/14/2014 2  0 - 5 % Final  . Eosinophils Absolute 08/14/2014 0.2  0.0 - 0.7 K/uL Final  . Basophils Relative 08/14/2014 0  0 - 1 % Final  . Basophils Absolute 08/14/2014 0.0  0.0 - 0.1 K/uL Final  . Sodium 08/14/2014 137  137 - 147 mEq/L Final  . Potassium 08/14/2014 3.7  3.7 - 5.3 mEq/L Final  . Chloride 08/14/2014 99  96 - 112 mEq/L Final  . CO2 08/14/2014 25  19 - 32 mEq/L Final  . Glucose, Bld 08/14/2014 113* 70 - 99 mg/dL Final  . BUN 08/14/2014 7  6 - 23  mg/dL Final  . Creatinine, Ser 08/14/2014 0.61  0.50 - 1.10 mg/dL Final  . Calcium 08/14/2014 10.0  8.4 - 10.5 mg/dL Final  . Total Protein 08/14/2014 8.8* 6.0 - 8.3 g/dL Final  . Albumin 08/14/2014 3.3* 3.5 - 5.2 g/dL Final  . AST 08/14/2014 16  0 - 37 U/L Final  . ALT 08/14/2014 6  0 - 35 U/L Final  . Alkaline Phosphatase 08/14/2014 105  39 - 117 U/L Final  . Total Bilirubin 08/14/2014 0.2* 0.3 - 1.2 mg/dL Final  . GFR calc non Af Amer 08/14/2014 >90  >90 mL/min Final  . GFR calc Af Amer 08/14/2014 >90  >90 mL/min Final   Comment: (NOTE)                          The eGFR has been calculated using the CKD EPI equation.                          This calculation has not been validated in all clinical situations.                          eGFR's persistently <90 mL/min signify possible Chronic Kidney                          Disease.  . Anion gap 08/14/2014 13  5 - 15 Final  . LDH 08/14/2014 199  94 - 250 U/L Final  . Cortisol, Plasma 08/14/2014 18.3   Final   Comment: (NOTE)  AM:  4.3 - 22.4 ug/dL                          PM:  3.1 - 16.7 ug/dL                          Performed at [facility]:  for Melissa, Sullivan ([id number])  Patient: Melissa Sullivan, Melissa Sullivan Collected: 08/01/2014 [place]  Accession: [id number] Received: 08/01/2014 Gasper Sells, MD  DOB: [birth year] Age: 89 Gender: F Reported: 08/06/2014 [address]  Patient Ph: [phone] MRN #: [id number] [place], [place] [address]  Visit #: [id number] Chart #: Phone: [phone] Fax:  CC: Robynn Pane, PA-C  REPORT OF SURGICAL PATHOLOGY  FINAL DIAGNOSIS  Diagnosis  Lung, needle/core biopsy(ies), right upper lobe  - POORLY DIFFERENTIATED ADENOCARCINOMA, SEE COMMENT.  .  Microscopic Comment  The carcinoma demonstrates the following immunophenotype:  TTF-1 - negative expression.  CK 5/6 - focal minimal moderate expression.  GCDFP -  negative expression.  Estrogen receptor - focal moderate strong expression.  Napsin-A - negative expression.  p63 - negative expression.  Cytokeratin 7 - strong diffuse expression.  The morphologic and immunophenotypic differentials include metastasis from mammary, gynecological or pulmonary  primary. The case was reviewed with Dr Saralyn Pilar who concurs. The case was discussed with Robynn Pane, PA-C on  08/06/2014. (CRR:ecj 08/06/2014)  Mali RUND DO  Pathologist, Electronic Signature  (Case signed 08/06/2014)  Specimen Gross and Clinical Information  Specimen(s) Obtained:  Lung, needle/core biopsy(ies), right upper lobe  Specimen Clinical Information  large RUL mass into chest wall and rib, suspect lung cancer (cm)  1 of 2  Duplicate copy  FINAL for Placide, Marce T ([id number])  Gross  In formalin are three cores of tan gray soft tissue ranging from 1.2 x 0.1 cm to 1.5 x 0.1 cm, in toto in one block. (SW:ds  08/01/14)  Stain(s) used in Diagnosis:  The following stain(s) were used in diagnosing the case: ER - NOACIS, P63, CK 5/6, GCDFP, Napsin-A, CK-7, Thyroid  Transcription Factor -1. The control(s) stained appropriately.  Disclaimer  Some of these immunohistochemical stains may have been developed and the performance characteristics determined by [facility]. Some may not have been cleared or approved  by the U.S. Food and Drug Administration. The FDA has determined that such clearance or approval is not necessary. This test is used for clinical purposes. It should not be regarded as  investigational or for research. This laboratory is certified under the [employer] (CLIA-88) as qualified to perform high complexity clinical laboratory  testing.  Report signed out from the following location(s)  Technical component and interpretation was performed at [facility],  [place], [place] [address]. CLIA #:  [id number],     Urinalysis    Component Value Date/Time   LABSPEC <1.005* 09/02/2014 0932   PHURINE 6.5 09/02/2014 0932   GLUCOSEU NEGATIVE 09/02/2014 0932   HGBUR NEGATIVE 09/02/2014 0932   BILIRUBINUR NEGATIVE 09/02/2014 0932   KETONESUR NEGATIVE 09/02/2014 0932   PROTEINUR NEGATIVE 09/02/2014 0932   UROBILINOGEN 0.2 09/02/2014 0932   NITRITE NEGATIVE 09/02/2014 0932   LEUKOCYTESUR NEGATIVE 09/02/2014 0932    RADIOGRAPHIC STUDIES: Ir Fluoro Guide Cv Line Left  09/01/2014   CLINICAL DATA:  58 year old female with a right-sided Pancoast tumor. She has been referred for port catheter  placement.  EXAM: IR LEFT FLOURO GUIDE CV LINE; IR ULTRASOUND GUIDANCE VASC ACCESS LEFT  Date: 09/01/2014  ANESTHESIA/SEDATION: Moderate (conscious) sedation was administered during this procedure. A total of 3.0 mg Versed and 100 mg Fentanyl were administered intravenously. The patient's vital signs were monitored continuously by radiology nursing throughout the course of the procedure.  Total sedation time: 36 minutes  FLUOROSCOPY TIME:  36 seconds  TECHNIQUE: The left neck and chest was prepped with chlorhexidine, and draped in the usual sterile fashion using maximum barrier technique (cap and mask, sterile gown, sterile gloves, large sterile sheet, hand hygiene and cutaneous antiseptic). Antibiotic prophylaxis was provided with 2.0g Ancef administered IV one hour prior to skin incision. Local anesthesia was attained by infiltration with 1% lidocaine.  Ultrasound demonstrated patency of the left internal jugular vein, and this was documented with an image. Under real-time ultrasound guidance, this vein was accessed with a 21 gauge micropuncture needle and image documentation was performed. A small dermatotomy was made at the access site with an 11 scalpel. A 0.018" wire was advanced into the SVC and the access needle exchanged for a 83F micropuncture vascular sheath. The 0.018" wire was then removed and a 0.035"  wire advanced into the IVC.  An appropriate location for the subcutaneous reservoir was selected below the clavicle and an incision was made through the skin and underlying soft tissues. The subcutaneous tissues were then dissected using a combination of blunt and sharp surgical technique and a pocket was formed. A single lumen power injectable portacatheter was then tunneled through the subcutaneous tissues from the pocket to the dermatotomy and the port reservoir placed within the subcutaneous pocket.  The venous access site was then serially dilated and a peel away vascular sheath placed over the wire. The wire was removed and the port catheter advanced into position under fluoroscopic guidance. The catheter tip is positioned in the upper right atrium. This was documented with a spot image. The portacatheter was then tested and found to flush and aspirate well. The port was flushed with saline followed by 100 units/mL heparinized saline.  The pocket was then closed in two layers using first subdermal inverted interrupted absorbable sutures followed by a running subcuticular suture. The epidermis was then sealed with Dermabond. The dermatotomy at the venous access site was also sealed with Dermabond.  COMPLICATIONS: None.  The patient tolerated the procedure well.  IMPRESSION: Status post placement of left internal jugular approach single-lumen port catheter.  The catheter is ready for use.  Signed,  Dulcy Fanny. Earleen Newport, DO  [facility]  [facility]   Electronically Signed   By: Corrie Mckusick D.O.   On: 09/01/2014 13:44   Ir US Guide Vasc Access Left  09/01/2014   CLINICAL DATA:  58 year old female with a right-sided Pancoast tumor. She has been referred for port catheter placement.  EXAM: IR LEFT FLOURO GUIDE CV LINE; IR ULTRASOUND GUIDANCE VASC ACCESS LEFT  Date: 09/01/2014  ANESTHESIA/SEDATION: Moderate (conscious) sedation was administered during this procedure. A  total of 3.0 mg Versed and 100 mg Fentanyl were administered intravenously. The patient's vital signs were monitored continuously by radiology nursing throughout the course of the procedure.  Total sedation time: 36 minutes  FLUOROSCOPY TIME:  36 seconds  TECHNIQUE: The left neck and chest was prepped with chlorhexidine, and draped in the usual sterile fashion using maximum barrier technique (cap and mask, sterile gown, sterile gloves, large sterile sheet, hand hygiene and cutaneous antiseptic). Antibiotic  prophylaxis was provided with 2.0g Ancef administered IV one hour prior to skin incision. Local anesthesia was attained by infiltration with 1% lidocaine.  Ultrasound demonstrated patency of the left internal jugular vein, and this was documented with an image. Under real-time ultrasound guidance, this vein was accessed with a 21 gauge micropuncture needle and image documentation was performed. A small dermatotomy was made at the access site with an 11 scalpel. A 0.018" wire was advanced into the SVC and the access needle exchanged for a 24F micropuncture vascular sheath. The 0.018" wire was then removed and a 0.035" wire advanced into the IVC.  An appropriate location for the subcutaneous reservoir was selected below the clavicle and an incision was made through the skin and underlying soft tissues. The subcutaneous tissues were then dissected using a combination of blunt and sharp surgical technique and a pocket was formed. A single lumen power injectable portacatheter was then tunneled through the subcutaneous tissues from the pocket to the dermatotomy and the port reservoir placed within the subcutaneous pocket.  The venous access site was then serially dilated and a peel away vascular sheath placed over the wire. The wire was removed and the port catheter advanced into position under fluoroscopic guidance. The catheter tip is positioned in the upper right atrium. This was documented with a spot image. The  portacatheter was then tested and found to flush and aspirate well. The port was flushed with saline followed by 100 units/mL heparinized saline.  The pocket was then closed in two layers using first subdermal inverted interrupted absorbable sutures followed by a running subcuticular suture. The epidermis was then sealed with Dermabond. The dermatotomy at the venous access site was also sealed with Dermabond.  COMPLICATIONS: None.  The patient tolerated the procedure well.  IMPRESSION: Status post placement of left internal jugular approach single-lumen port catheter.  The catheter is ready for use.  Signed,  Dulcy Fanny. Earleen Newport, DO  [facility]  [facility] Radiology   Electronically Signed   By: Corrie Mckusick D.O.   On: 09/01/2014 13:44    ASSESSMENT:  #1. Stage IV adenocarcinoma lung presenting as a Pancoast tumor with right brachial plexus neuralgia with metastases to mediastinum, chest wall, and retroperitoneal lymph nodes plus bilateral adrenal metastases, good tolerance of initial chemotherapy..  #2. Chronic obstructive pulmonary disease.  #3. Gastroesophageal reflux disease. #4. 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 Cisplatin was used instead of carboplatin in his regimen because of intention to give concurrent radiotherapy to the right upper lobe tumor and brachial plexus to try to prevent long-term right upper extremity morbidity.    PLAN:  #1. Call if tinnitus worsens willl switch to carboplatin. #2. Continue folic acid 1 mg daily. #3. Followup in 2 weeks for cycle 2 of chemotherapy.   All questions were answered. The patient knows to call the clinic with any problems, questions or concerns. We can certainly see the patient much sooner if necessary.   I spent 25 minutes counseling the patient face to face. The total time spent in the appointment was 30 minutes.    Doroteo Bradford, MD 09/09/2014 9:45 AM  DISCLAIMER:  This note was dictated with  voice recognition software.  Similar sounding words can inadvertently be transcribed inaccurately and may not be corrected upon review.

## 2014-09-09 NOTE — Patient Instructions (Signed)
[facility] Discharge Instructions  RECOMMENDATIONS MADE BY THE CONSULTANT AND ANY TEST RESULTS WILL BE SENT TO YOUR REFERRING PHYSICIAN.  EXAM FINDINGS BY THE PHYSICIAN TODAY AND SIGNS OR SYMPTOMS TO REPORT TO CLINIC OR PRIMARY PHYSICIAN: Exam and findings as discussed by Dr. Barnet Glasgow.  Let us know if the ringing in your ears worsens or if your hearing becomes involved.  Report fevers, uncontrolled nausea or vomiting or other concerns.  MEDICATIONS PRESCRIBED:  none  INSTRUCTIONS/FOLLOW-UP: Follow-up in 2 weeks.  Thank you for choosing [facility] to provide your oncology and hematology care.  To afford each patient quality time with our providers, please arrive at least 15 minutes before your scheduled appointment time.  With your help, our goal is to use those 15 minutes to complete the necessary work-up to ensure our physicians have the information they need to help with your evaluation and healthcare recommendations.    Effective January 1st, 2014, we ask that you re-schedule your appointment with our physicians should you arrive 10 or more minutes late for your appointment.  We strive to give you quality time with our providers, and arriving late affects you and other patients whose appointments are after yours.    Again, thank you for choosing [facility].  Our hope is that these requests will decrease the amount of time that you wait before being seen by our physicians.       _____________________________________________________________  Should you have questions after your visit to [facility], please contact our office at [phone] between the hours of 8:30 a.m. and 4:30 p.m.  Voicemails left after 4:30 p.m. will not be returned until the following business day.  For prescription refill requests, have your pharmacy contact our office with your prescription refill request.     _______________________________________________________________  We hope that we have given you very good care.  You may receive a patient satisfaction survey in the mail, please complete it and return it as soon as possible.  We value your feedback!  _______________________________________________________________  Have you asked about our STAR program?  STAR stands for Survivorship Training and Rehabilitation, and this is a nationally recognized cancer care program that focuses on survivorship and rehabilitation.  Cancer and cancer treatments may cause problems, such as, pain, making you feel tired and keeping you from doing the things that you need or want to do. Cancer rehabilitation can help. Our goal is to reduce these troubling effects and help you have the best quality of life possible.  You may receive a survey from a nurse that asks questions about your current state of health.  Based on the survey results, all eligible patients will be referred to the [facility] program for an evaluation so we can better serve you!  A frequently asked questions sheet is available upon request.

## 2014-09-12 ENCOUNTER — Telehealth: Payer: Self-pay | Admitting: Nutrition

## 2014-09-12 NOTE — Telephone Encounter (Signed)
[facility] Malnutrition Risk Screen Referral  Patient identified to be at risk for malnutrition on the malnutrition risk screen.  Patient is a 58 year old female diagnosed with lung cancer receiving chemotherapy.  Past medical history includes hypertension, reflux, hyperlipidemia, and tobacco.  Medications include Decadron, Reglan, Prilosec, Compazine, Senokot, and Zocor.  Labs include sodium 136, glucose 151, and albumin 3.1.  Height: 4 feet 11 inches. Weight: 90 pounds. Usual body weight: 110 pounds. BMI: 18.17.  Patient reports she thinks she is eating a little better.  Some foods don't taste good to her but she makes herself eat them.  She is drinking Ensure Plus twice a day.   Dietary recall reveals patient does choose protein foods often.  Nutrition diagnosis: Unintended weight loss related to inadequate oral intake as evidenced by 19% weight loss from usual body weight.  Intervention: Patient educated to consume more frequent meals and snacks with high-calorie, high-protein foods. Recommended patient increase Ensure Plus to 4 times a day. Patient educated on high-calorie, high-protein foods. Have mailed patient fact sheets on increasing calories and protein, making the most of each bite, and taste alterations.  I've also mailed patient coupons for Ensure Plus. Recommended patient contact me by phone for further needs.  I have mailed her my contact information.  Monitoring, evaluation, goals: Patient will tolerate increased calories and protein with Ensure Plus 4 times a day to minimize further weight loss.  Next visit: Patient to contact me by phone as needed.    **Disclaimer: This note was dictated with voice recognition software. Similar sounding words can inadvertently be transcribed and this note may contain transcription errors which may not have been corrected upon publication of note.**

## 2014-09-17 ENCOUNTER — Telehealth (HOSPITAL_COMMUNITY): Payer: Self-pay | Admitting: *Deleted

## 2014-09-17 NOTE — Telephone Encounter (Signed)
Patient called to ask if she could change her Fentanyl patch in different places. I said yes. She wanted to be sure that it was ok to put the patch on her RT breast and I told he yes. I asked her had she been alternating spots for her Duragesic patch and she said yes. Patient is doing great she says. She says her pain is very much improved.

## 2014-09-19 ENCOUNTER — Other Ambulatory Visit (HOSPITAL_COMMUNITY)
Admission: RE | Admit: 2014-09-19 | Discharge: 2014-09-19 | Disposition: A | Discharge status: Home / Self Care | Payer: PRIVATE HEALTH INSURANCE | Source: Ambulatory Visit | Attending: Oncology | Admitting: Oncology

## 2014-09-19 DIAGNOSIS — C801 Malignant (primary) neoplasm, unspecified: Secondary | ICD-10-CM | POA: Diagnosis present

## 2014-09-21 NOTE — Progress Notes (Signed)
[name],[name], PA-C [address] [place] [address]  Cancer of upper lobe of right lung - Plan: CT Chest W Contrast, CT Abdomen Pelvis W Contrast, Oxycodone HCl 10 MG TABS, fentaNYL (DURAGESIC - DOSED MCG/HR) 12 MCG/HR  CURRENT THERAPY: Cisplatin/pemetrexed/Avastin started 09/02/2014.  INTERVAL HISTORY: Melissa Sullivan 58 y.o. female returns for  regular  visit for followup of stage IV adenocarcinoma of the lung, Pancoast tumor on the right with right brachio-plexopathy and metastases to the mediastinum, chest wall, retroperitoneal lymph nodes, and bilateral adrenal glands.     Cancer of upper lobe of right lung   07/28/2014 Imaging CT chest: Large R apical mass consistent with malignancy. This is destroying the R 2nd rib with extension into adjacent soft tissue. R hilar adenopathy with R 5cm adrenal metastatic lesion.   08/01/2014 Initial Biopsy Lung, needle/core biopsy(ies), right upper lobe - POORLY DIFFERENTIATED ADENOCARCINOMA, SEE COMMENT.   08/08/2014 PET scan Large hypermetabolic R apical mass with evidence of direct chest wall and mediastinal invasion, right retrocrural lymphadenopathy, extensive retroperitoneal lymphadenopathy, and metastatic lesions to the adrenal glands    09/02/2014 -  Chemotherapy Cisplatin/Pemetrexed/Avastin every 21 days    I personally reviewed and went over laboratory results with the patient.  The results are noted within this dictation.  She reports that her pain has increased slightly and therefore wishes to have an a very small increase in fentanyl dose.  She is only on 25 mcg/hr of Fentanyl and therefore, I will add a 12.5 mcg/hr patch to that regimen.    I have explained to the patient that her case was discussed at the [facility] a few weeks ago, and the conclusion of that discussion was for her to receive palliative radiation for brachial plexus involvement on right resulting in mild neuropathy of right upper arm.  She is  agreeable to a referral for Rad Onc.  She reports that on week 1 or 2 of cycle 1, one of her right sided upper teeth cracked and fell out.  She is minimally symptomatic and I recommended that she follow-up with her dentist, BUT she is to let her provider know that she is actively on chemotherapy.  We should have a discussion with the patient's dentist regarding dental recommendations.   She denies any peripheral neuropathy and notes that she tolerated cycle 1 of chemotherapy well.  Oncologically, she denies any complaints and ROS questioning is negative.   Past Medical History  Diagnosis Date  . Hypertension   . Reflux   . Hyperlipidemia   . Lung mass 07/28/2014  . Adrenal mass, right 07/28/2014    has Adrenal mass, right and Cancer of upper lobe of right lung on her problem list.     has No Known Allergies.  Ms. Acero had no medications administered during this visit.  Past Surgical History  Procedure Laterality Date  . Appendectomy    . Lung biopsy Right 07/2014    CT guided  . Portacath placement Left 09/01/2014    Denies any headaches, dizziness, double vision, fevers, chills, night sweats, nausea, vomiting, diarrhea, constipation, chest pain, heart palpitations, shortness of breath, blood in stool, black tarry stool, urinary pain, urinary burning, urinary frequency, hematuria.   PHYSICAL EXAMINATION  ECOG PERFORMANCE STATUS: 1 - Symptomatic but completely ambulatory  There were no vitals filed for this visit.  GENERAL:alert, no distress, well nourished, well developed, comfortable, cooperative and smiling SKIN: skin color, texture, turgor are normal, no rashes or  significant lesions HEAD: Normocephalic, No masses, lesions, tenderness or abnormalities EYES: normal, PERRLA, EOMI, Conjunctiva are pink and non-injected EARS: External ears normal OROPHARYNX:poor dental hygiene with noted dental work and a right front tooth missing  NECK: supple, thyroid normal size,  non-tender, without nodularity, no stridor, non-tender, trachea midline LYMPH:  no palpable lymphadenopathy BREAST:not examined LUNGS: clear to auscultation  HEART: regular rate & rhythm, no murmurs and no gallops ABDOMEN:abdomen soft, normal bowel sounds and no masses or organomegaly BACK: Back symmetric, no curvature. EXTREMITIES:less then 2 second capillary refill, no joint deformities, effusion, or inflammation, no skin discoloration, no cyanosis, positive findings:  Clubbing of fingernails noted.  NEURO: alert & oriented x 3 with fluent speech, no focal motor/sensory deficits   LABORATORY DATA: CBC    Component Value Date/Time   WBC 27.0* 09/23/2014 0903   RBC 3.30* 09/23/2014 0903   HGB 9.7* 09/23/2014 0903   HCT 29.2* 09/23/2014 0903   PLT 751* 09/23/2014 0903   MCV 88.5 09/23/2014 0903   MCH 29.4 09/23/2014 0903   MCHC 33.2 09/23/2014 0903   RDW 15.5 09/23/2014 0903   LYMPHSABS 2.7 09/23/2014 0903   MONOABS 1.0 09/23/2014 0903   EOSABS 0.0 09/23/2014 0903   BASOSABS 0.1 09/23/2014 0903      Chemistry      Component Value Date/Time   NA 135* 09/23/2014 0903   K 4.6 09/23/2014 0903   CL 97 09/23/2014 0903   CO2 25 09/23/2014 0903   BUN 14 09/23/2014 0903   CREATININE 0.80 09/23/2014 0903      Component Value Date/Time   CALCIUM 10.8* 09/23/2014 0903   ALKPHOS 131* 09/23/2014 0903   AST 13 09/23/2014 0903   ALT 15 09/23/2014 0903   BILITOT <0.2* 09/23/2014 0903        ASSESSMENT:  1. Stage IV adenocarcinoma lung presenting as a Pancoast tumor with right brachial plexus neuralgia with metastases to mediastinum, chest wall, and retroperitoneal lymph nodes plus bilateral adrenal metastases, good tolerance of initial chemotherapy..  2. Chronic obstructive pulmonary disease.  3. Gastroesophageal reflux disease. 4. 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 Cisplatin was used instead of carboplatin in his regimen because of intention to give concurrent radiotherapy to the  right upper lobe tumor and brachial plexus to try to prevent long-term right upper extremity morbidity.  Patient Active Problem List   Diagnosis Date Noted  . Cancer of upper lobe of right lung 08/14/2014  . Adrenal mass, right 07/28/2014    PLAN:  1. I personally reviewed and went over laboratory results with the patient.  The results are noted within this dictation. 2. Cycle 2 of chemotherapy as planned. 3. Pre-chemo labs as ordered 4. CT CAP with contrast for restaging following cycle 3 5. Referral to Rad Onc for consideration of palliaitive radiation for brachial plexus neuralgia 6. Rx for Fentanyl for 12.5 mcg/hr.  This is to be added to current 25 mcg/hr dose. 7. Refill of Oxycodone provided. 8. Recommend follow-up with dentist for broken tooth.  Patient is to advise dentist that she is on active chemotherapy and discussion with dentist and oncology should occur prior to embarking on dental procedures. 9. Return in 3 weeks for follow-up and cycle 3 of chemotherapy.   THERAPY PLAN:  We will continue therapy and restage following cycle 3 of systemic chemotherapy.  I will refer the patient to Rad Onc per presentation at [facility] for consideration of palliative brachial plexus neuralgia.   All questions were answered. The  patient knows to call the clinic with any problems, questions or concerns. We can certainly see the patient much sooner if necessary.  Patient and plan discussed with Dr. Farrel Gobble and he is in agreement with the aforementioned.   Ilea Hilton 09/23/2014

## 2014-09-23 ENCOUNTER — Encounter (HOSPITAL_COMMUNITY): Payer: PRIVATE HEALTH INSURANCE | Attending: Hematology and Oncology

## 2014-09-23 ENCOUNTER — Encounter (HOSPITAL_COMMUNITY): Payer: Self-pay | Admitting: Lab

## 2014-09-23 ENCOUNTER — Telehealth (HOSPITAL_COMMUNITY): Payer: Self-pay | Admitting: Oncology

## 2014-09-23 ENCOUNTER — Encounter (HOSPITAL_BASED_OUTPATIENT_CLINIC_OR_DEPARTMENT_OTHER): Payer: PRIVATE HEALTH INSURANCE | Admitting: Oncology

## 2014-09-23 DIAGNOSIS — C7972 Secondary malignant neoplasm of left adrenal gland: Secondary | ICD-10-CM | POA: Diagnosis not present

## 2014-09-23 DIAGNOSIS — C7989 Secondary malignant neoplasm of other specified sites: Secondary | ICD-10-CM | POA: Insufficient documentation

## 2014-09-23 DIAGNOSIS — C3411 Malignant neoplasm of upper lobe, right bronchus or lung: Secondary | ICD-10-CM | POA: Diagnosis present

## 2014-09-23 DIAGNOSIS — H9319 Tinnitus, unspecified ear: Secondary | ICD-10-CM

## 2014-09-23 DIAGNOSIS — C772 Secondary and unspecified malignant neoplasm of intra-abdominal lymph nodes: Secondary | ICD-10-CM

## 2014-09-23 DIAGNOSIS — C781 Secondary malignant neoplasm of mediastinum: Secondary | ICD-10-CM | POA: Diagnosis not present

## 2014-09-23 DIAGNOSIS — K219 Gastro-esophageal reflux disease without esophagitis: Secondary | ICD-10-CM | POA: Insufficient documentation

## 2014-09-23 DIAGNOSIS — C7971 Secondary malignant neoplasm of right adrenal gland: Secondary | ICD-10-CM

## 2014-09-23 DIAGNOSIS — F17210 Nicotine dependence, cigarettes, uncomplicated: Secondary | ICD-10-CM | POA: Diagnosis not present

## 2014-09-23 DIAGNOSIS — I10 Essential (primary) hypertension: Secondary | ICD-10-CM | POA: Diagnosis not present

## 2014-09-23 DIAGNOSIS — Z5111 Encounter for antineoplastic chemotherapy: Secondary | ICD-10-CM

## 2014-09-23 DIAGNOSIS — E785 Hyperlipidemia, unspecified: Secondary | ICD-10-CM | POA: Insufficient documentation

## 2014-09-23 DIAGNOSIS — Z79899 Other long term (current) drug therapy: Secondary | ICD-10-CM | POA: Insufficient documentation

## 2014-09-23 DIAGNOSIS — Z5112 Encounter for antineoplastic immunotherapy: Secondary | ICD-10-CM

## 2014-09-23 DIAGNOSIS — Z9089 Acquired absence of other organs: Secondary | ICD-10-CM | POA: Insufficient documentation

## 2014-09-23 DIAGNOSIS — J449 Chronic obstructive pulmonary disease, unspecified: Secondary | ICD-10-CM | POA: Diagnosis not present

## 2014-09-23 LAB — CBC WITH DIFFERENTIAL/PLATELET
BASOS PCT: 0 % (ref 0–1)
Basophils Absolute: 0.1 10*3/uL (ref 0.0–0.1)
EOS ABS: 0 10*3/uL (ref 0.0–0.7)
Eosinophils Relative: 0 % (ref 0–5)
HCT: 29.2 % — ABNORMAL LOW (ref 36.0–46.0)
Hemoglobin: 9.7 g/dL — ABNORMAL LOW (ref 12.0–15.0)
Lymphocytes Relative: 10 % — ABNORMAL LOW (ref 12–46)
Lymphs Abs: 2.7 10*3/uL (ref 0.7–4.0)
MCH: 29.4 pg (ref 26.0–34.0)
MCHC: 33.2 g/dL (ref 30.0–36.0)
MCV: 88.5 fL (ref 78.0–100.0)
Monocytes Absolute: 1 10*3/uL (ref 0.1–1.0)
Monocytes Relative: 4 % (ref 3–12)
NEUTROS PCT: 86 % — AB (ref 43–77)
Neutro Abs: 23.2 10*3/uL — ABNORMAL HIGH (ref 1.7–7.7)
PLATELETS: 751 10*3/uL — AB (ref 150–400)
RBC: 3.3 MIL/uL — ABNORMAL LOW (ref 3.87–5.11)
RDW: 15.5 % (ref 11.5–15.5)
WBC: 27 10*3/uL — ABNORMAL HIGH (ref 4.0–10.5)

## 2014-09-23 LAB — COMPREHENSIVE METABOLIC PANEL
ALBUMIN: 2.8 g/dL — AB (ref 3.5–5.2)
ALK PHOS: 131 U/L — AB (ref 39–117)
ALT: 15 U/L (ref 0–35)
ANION GAP: 13 (ref 5–15)
AST: 13 U/L (ref 0–37)
BUN: 14 mg/dL (ref 6–23)
CO2: 25 mEq/L (ref 19–32)
Calcium: 10.8 mg/dL — ABNORMAL HIGH (ref 8.4–10.5)
Chloride: 97 mEq/L (ref 96–112)
Creatinine, Ser: 0.8 mg/dL (ref 0.50–1.10)
GFR calc Af Amer: >90 mL/min (ref >90)
GFR calc non Af Amer: 80 mL/min — ABNORMAL LOW (ref >90)
Glucose, Bld: 188 mg/dL — ABNORMAL HIGH (ref 70–99)
POTASSIUM: 4.6 meq/L (ref 3.7–5.3)
SODIUM: 135 meq/L — AB (ref 137–147)
Total Bilirubin: <0.2 mg/dL — ABNORMAL LOW (ref 0.3–1.2)
Total Protein: 8.2 g/dL (ref 6.0–8.3)

## 2014-09-23 LAB — URINALYSIS, DIPSTICK ONLY
BILIRUBIN URINE: NEGATIVE
GLUCOSE, UA: NEGATIVE mg/dL
Hgb urine dipstick: NEGATIVE
LEUKOCYTES UA: NEGATIVE
Nitrite: NEGATIVE
PH: 5.5 (ref 5.0–8.0)
Protein, ur: 30 mg/dL — AB
SPECIFIC GRAVITY, URINE: 1.02 (ref 1.005–1.030)
Urobilinogen, UA: 0.2 mg/dL (ref 0.0–1.0)

## 2014-09-23 MED ORDER — CYANOCOBALAMIN 1000 MCG/ML IJ SOLN
INTRAMUSCULAR | Status: AC
  Filled 2014-09-23: qty 1

## 2014-09-23 MED ORDER — DEXAMETHASONE SODIUM PHOSPHATE 20 MG/5ML IJ SOLN: 12.0000 mg | Freq: Once | INTRAMUSCULAR | Status: DC

## 2014-09-23 MED ORDER — PALONOSETRON HCL INJECTION 0.25 MG/5ML
0.2500 mg | Freq: Once | INTRAVENOUS | Status: AC
  Administered 2014-09-23: 0.25 mg via INTRAVENOUS

## 2014-09-23 MED ORDER — SODIUM CHLORIDE 0.9 % IV SOLN
15.0000 mg/kg | Freq: Once | INTRAVENOUS | Status: AC
  Administered 2014-09-23: 650 mg via INTRAVENOUS
  Filled 2014-09-23: qty 26

## 2014-09-23 MED ORDER — FENTANYL 12 MCG/HR TD PT72: 12.5000 ug | MEDICATED_PATCH | TRANSDERMAL | Status: DC

## 2014-09-23 MED ORDER — SODIUM CHLORIDE 0.9 % IV SOLN: 150.0000 mg | Freq: Once | INTRAVENOUS | Status: DC

## 2014-09-23 MED ORDER — PALONOSETRON HCL INJECTION 0.25 MG/5ML
INTRAVENOUS | Status: AC
  Filled 2014-09-23: qty 5

## 2014-09-23 MED ORDER — OXYCODONE HCL 10 MG PO TABS
10.0000 mg | ORAL_TABLET | Freq: Four times a day (QID) | ORAL | Status: DC | PRN
Start: 2014-09-23 — End: 2014-10-14

## 2014-09-23 MED ORDER — CYANOCOBALAMIN 1000 MCG/ML IJ SOLN
1000.0000 ug | Freq: Once | INTRAMUSCULAR | Status: AC
  Administered 2014-09-23: 1000 ug via INTRAMUSCULAR

## 2014-09-23 MED ORDER — HEPARIN SOD (PORK) LOCK FLUSH 100 UNIT/ML IV SOLN
500.0000 [IU] | Freq: Once | INTRAVENOUS | Status: AC | PRN
  Administered 2014-09-23: 500 [IU]
  Filled 2014-09-23: qty 5

## 2014-09-23 MED ORDER — SODIUM CHLORIDE 0.9 % IV SOLN
Freq: Once | INTRAVENOUS | Status: AC
  Administered 2014-09-23: 13:00:00 via INTRAVENOUS
  Filled 2014-09-23: qty 5

## 2014-09-23 MED ORDER — POTASSIUM CHLORIDE 2 MEQ/ML IV SOLN
Freq: Once | INTRAVENOUS | Status: AC
  Administered 2014-09-23: 11:00:00 via INTRAVENOUS
  Filled 2014-09-23: qty 10

## 2014-09-23 MED ORDER — SODIUM CHLORIDE 0.9 % IJ SOLN
10.0000 mL | INTRAMUSCULAR | Status: DC | PRN
  Administered 2014-09-23: 10 mL
  Filled 2014-09-23: qty 10

## 2014-09-23 MED ORDER — SODIUM CHLORIDE 0.9 % IV SOLN
500.0000 mg/m2 | Freq: Once | INTRAVENOUS | Status: AC
  Administered 2014-09-23: 675 mg via INTRAVENOUS
  Filled 2014-09-23: qty 27

## 2014-09-23 MED ORDER — SODIUM CHLORIDE 0.9 % IV SOLN
75.0000 mg/m2 | Freq: Once | INTRAVENOUS | Status: AC
  Administered 2014-09-23: 100 mg via INTRAVENOUS
  Filled 2014-09-23: qty 100

## 2014-09-23 NOTE — Patient Instructions (Signed)
[facility] Discharge Instructions  RECOMMENDATIONS MADE BY THE CONSULTANT AND ANY TEST RESULTS WILL BE SENT TO YOUR REFERRING PHYSICIAN.  Cycle 2 of chemotherapy as planned. Pre-chemo labs as ordered Referral to Rad Onc for consideration of palliaitive radiation for brachial plexus neuralgia Rx for Fentanyl for 12.5 mcg/hr.  This is to be added to current 25 mcg/hr dose. Refill of Oxycodone provided. Recommend follow-up with dentist for broken tooth.  Patient is to advise dentist that she is on active chemotherapy and discussion with dentist and oncology should occur prior to embarking on dental procedures. Return in 3 weeks for follow-up and cycle 3 of chemotherapy.   Thank you for choosing [facility] to provide your oncology and hematology care.  To afford each patient quality time with our providers, please arrive at least 15 minutes before your scheduled appointment time.  With your help, our goal is to use those 15 minutes to complete the necessary work-up to ensure our physicians have the information they need to help with your evaluation and healthcare recommendations.    Effective January 1st, 2014, we ask that you re-schedule your appointment with our physicians should you arrive 10 or more minutes late for your appointment.  We strive to give you quality time with our providers, and arriving late affects you and other patients whose appointments are after yours.    Again, thank you for choosing [facility].  Our hope is that these requests will decrease the amount of time that you wait before being seen by our physicians.       _____________________________________________________________  Should you have questions after your visit to [facility], please contact our office at [phone] between the hours of 8:30 a.m. and 5:00 p.m.  Voicemails left after 4:30 p.m. will not be returned until the following business day.   For prescription refill requests, have your pharmacy contact our office with your prescription refill request.

## 2014-09-23 NOTE — Telephone Encounter (Signed)
FAX SCAN REQUEST TO:  [facility] [phone]

## 2014-09-23 NOTE — Patient Instructions (Signed)
[facility] Discharge Instructions for Patients Receiving Chemotherapy  Today you received the following chemotherapy agents Cycle 2 Cisplatin,Pemetrexed, and Avastin.  To help prevent nausea and vomiting after your treatment, we encourage you to take your nausea medication as instructed.   If you develop nausea and vomiting that is not controlled by your nausea medication, call the clinic. If it is after clinic hours your family physician or the after hours number for the clinic or go to the Emergency Department.   BELOW ARE SYMPTOMS THAT SHOULD BE REPORTED IMMEDIATELY:  *FEVER GREATER THAN 101.0 F  *CHILLS WITH OR WITHOUT FEVER  NAUSEA AND VOMITING THAT IS NOT CONTROLLED WITH YOUR NAUSEA MEDICATION  *UNUSUAL SHORTNESS OF BREATH  *UNUSUAL BRUISING OR BLEEDING  TENDERNESS IN MOUTH AND THROAT WITH OR WITHOUT PRESENCE OF ULCERS  *URINARY PROBLEMS  *BOWEL PROBLEMS  UNUSUAL RASH Items with * indicate a potential emergency and should be followed up as soon as possible.  I have been informed and understand all the instructions given to me. I know to contact the clinic, my physician, or go to the Emergency Department if any problems should occur. I do not have any questions at this time, but understand that I may call the clinic during office hours or the Patient Navigator at [phone] should I have any questions or need assistance in obtaining follow up care.    __________________________________________  _____________  __________ Signature of Patient or Authorized Representative            Date                   Time    __________________________________________ Nurse's Signature

## 2014-09-23 NOTE — Progress Notes (Signed)
Tolerated chemo well. 

## 2014-09-23 NOTE — Progress Notes (Signed)
Referral sent to [facility] for Rad onc.  Records faxed on 11/3

## 2014-10-13 ENCOUNTER — Encounter (HOSPITAL_COMMUNITY): Payer: Self-pay

## 2014-10-14 ENCOUNTER — Encounter (HOSPITAL_BASED_OUTPATIENT_CLINIC_OR_DEPARTMENT_OTHER): Payer: PRIVATE HEALTH INSURANCE

## 2014-10-14 ENCOUNTER — Encounter (HOSPITAL_COMMUNITY): Payer: Self-pay | Admitting: Hematology and Oncology

## 2014-10-14 ENCOUNTER — Encounter (HOSPITAL_COMMUNITY): Payer: Self-pay

## 2014-10-14 ENCOUNTER — Encounter: Payer: Self-pay | Admitting: *Deleted

## 2014-10-14 DIAGNOSIS — C7972 Secondary malignant neoplasm of left adrenal gland: Secondary | ICD-10-CM

## 2014-10-14 DIAGNOSIS — Z5111 Encounter for antineoplastic chemotherapy: Secondary | ICD-10-CM

## 2014-10-14 DIAGNOSIS — C772 Secondary and unspecified malignant neoplasm of intra-abdominal lymph nodes: Secondary | ICD-10-CM

## 2014-10-14 DIAGNOSIS — C3411 Malignant neoplasm of upper lobe, right bronchus or lung: Secondary | ICD-10-CM | POA: Diagnosis not present

## 2014-10-14 DIAGNOSIS — Z5112 Encounter for antineoplastic immunotherapy: Secondary | ICD-10-CM

## 2014-10-14 DIAGNOSIS — M5412 Radiculopathy, cervical region: Secondary | ICD-10-CM

## 2014-10-14 LAB — URINALYSIS, DIPSTICK ONLY
Bilirubin Urine: NEGATIVE
GLUCOSE, UA: 500 mg/dL — AB
HGB URINE DIPSTICK: NEGATIVE
Ketones, ur: NEGATIVE mg/dL
Leukocytes, UA: NEGATIVE
Nitrite: NEGATIVE
Protein, ur: NEGATIVE mg/dL
SPECIFIC GRAVITY, URINE: 1.01 (ref 1.005–1.030)
Urobilinogen, UA: 0.2 mg/dL (ref 0.0–1.0)
pH: 6.5 (ref 5.0–8.0)

## 2014-10-14 LAB — COMPREHENSIVE METABOLIC PANEL
ALK PHOS: 144 U/L — AB (ref 39–117)
ALT: 27 U/L (ref 0–35)
AST: 18 U/L (ref 0–37)
Albumin: 3.1 g/dL — ABNORMAL LOW (ref 3.5–5.2)
Anion gap: 15 (ref 5–15)
BUN: 17 mg/dL (ref 6–23)
CO2: 23 meq/L (ref 19–32)
Calcium: 10.4 mg/dL (ref 8.4–10.5)
Chloride: 90 mEq/L — ABNORMAL LOW (ref 96–112)
Creatinine, Ser: 0.81 mg/dL (ref 0.50–1.10)
GFR, EST NON AFRICAN AMERICAN: 79 mL/min — AB (ref >90)
Glucose, Bld: 387 mg/dL — ABNORMAL HIGH (ref 70–99)
POTASSIUM: 4.5 meq/L (ref 3.7–5.3)
SODIUM: 128 meq/L — AB (ref 137–147)
TOTAL PROTEIN: 8.1 g/dL (ref 6.0–8.3)
Total Bilirubin: <0.2 mg/dL — ABNORMAL LOW (ref 0.3–1.2)

## 2014-10-14 LAB — CBC WITH DIFFERENTIAL/PLATELET
Basophils Absolute: 0 10*3/uL (ref 0.0–0.1)
Basophils Relative: 0 % (ref 0–1)
Eosinophils Absolute: 0 10*3/uL (ref 0.0–0.7)
Eosinophils Relative: 0 % (ref 0–5)
HCT: 28.7 % — ABNORMAL LOW (ref 36.0–46.0)
Hemoglobin: 9.8 g/dL — ABNORMAL LOW (ref 12.0–15.0)
LYMPHS ABS: 1 10*3/uL (ref 0.7–4.0)
LYMPHS PCT: 7 % — AB (ref 12–46)
MCH: 29.2 pg (ref 26.0–34.0)
MCHC: 34.1 g/dL (ref 30.0–36.0)
MCV: 85.4 fL (ref 78.0–100.0)
Monocytes Absolute: 1 10*3/uL (ref 0.1–1.0)
Monocytes Relative: 7 % (ref 3–12)
NEUTROS PCT: 86 % — AB (ref 43–77)
Neutro Abs: 11.5 10*3/uL — ABNORMAL HIGH (ref 1.7–7.7)
PLATELETS: 599 10*3/uL — AB (ref 150–400)
RBC: 3.36 MIL/uL — AB (ref 3.87–5.11)
RDW: 16.7 % — ABNORMAL HIGH (ref 11.5–15.5)
WBC: 13.5 10*3/uL — AB (ref 4.0–10.5)

## 2014-10-14 MED ORDER — POTASSIUM CHLORIDE 2 MEQ/ML IV SOLN
Freq: Once | INTRAVENOUS | Status: AC
  Administered 2014-10-14: 10:00:00 via INTRAVENOUS
  Filled 2014-10-14: qty 10

## 2014-10-14 MED ORDER — SODIUM CHLORIDE 0.9 % IV SOLN
15.0000 mg/kg | Freq: Once | INTRAVENOUS | Status: AC
  Administered 2014-10-14: 650 mg via INTRAVENOUS
  Filled 2014-10-14: qty 26

## 2014-10-14 MED ORDER — PALONOSETRON HCL INJECTION 0.25 MG/5ML
0.2500 mg | Freq: Once | INTRAVENOUS | Status: AC
  Administered 2014-10-14: 0.25 mg via INTRAVENOUS
  Filled 2014-10-14: qty 5

## 2014-10-14 MED ORDER — FENTANYL 12 MCG/HR TD PT72: 12.5000 ug | MEDICATED_PATCH | TRANSDERMAL | Status: DC

## 2014-10-14 MED ORDER — FENTANYL 25 MCG/HR TD PT72: 25.0000 ug | MEDICATED_PATCH | TRANSDERMAL | Status: DC

## 2014-10-14 MED ORDER — DEXAMETHASONE SODIUM PHOSPHATE 20 MG/5ML IJ SOLN: 12.0000 mg | Freq: Once | INTRAMUSCULAR | Status: DC

## 2014-10-14 MED ORDER — OXYCODONE HCL 10 MG PO TABS: 10.0000 mg | ORAL_TABLET | Freq: Four times a day (QID) | ORAL | Status: DC | PRN

## 2014-10-14 MED ORDER — METFORMIN HCL 500 MG PO TABS: 500.0000 mg | ORAL_TABLET | Freq: Two times a day (BID) | ORAL | Status: DC

## 2014-10-14 MED ORDER — SODIUM CHLORIDE 0.9 % IV SOLN: 150.0000 mg | Freq: Once | INTRAVENOUS | Status: DC

## 2014-10-14 MED ORDER — SODIUM CHLORIDE 0.9 % IV SOLN
75.0000 mg/m2 | Freq: Once | INTRAVENOUS | Status: AC
  Administered 2014-10-14: 100 mg via INTRAVENOUS
  Filled 2014-10-14: qty 100

## 2014-10-14 MED ORDER — HEPARIN SOD (PORK) LOCK FLUSH 100 UNIT/ML IV SOLN
500.0000 [IU] | Freq: Once | INTRAVENOUS | Status: AC | PRN
  Administered 2014-10-14: 500 [IU]
  Filled 2014-10-14: qty 5

## 2014-10-14 MED ORDER — SODIUM CHLORIDE 0.9 % IV SOLN
Freq: Once | INTRAVENOUS | Status: AC
  Administered 2014-10-14: 09:00:00 via INTRAVENOUS

## 2014-10-14 MED ORDER — SODIUM CHLORIDE 0.9 % IV SOLN
Freq: Once | INTRAVENOUS | Status: AC
  Administered 2014-10-14: 12:00:00 via INTRAVENOUS
  Filled 2014-10-14: qty 5

## 2014-10-14 MED ORDER — SODIUM CHLORIDE 0.9 % IV SOLN
500.0000 mg/m2 | Freq: Once | INTRAVENOUS | Status: AC
  Administered 2014-10-14: 675 mg via INTRAVENOUS
  Filled 2014-10-14: qty 27

## 2014-10-14 MED ORDER — CYANOCOBALAMIN 1000 MCG/ML IJ SOLN
1000.0000 ug | Freq: Once | INTRAMUSCULAR | Status: AC
  Administered 2014-10-14: 1000 ug via INTRAMUSCULAR
  Filled 2014-10-14: qty 1

## 2014-10-14 MED ORDER — SODIUM CHLORIDE 0.9 % IJ SOLN
10.0000 mL | INTRAMUSCULAR | Status: DC | PRN
  Administered 2014-10-14: 10 mL
  Filled 2014-10-14: qty 10

## 2014-10-14 NOTE — Progress Notes (Signed)
[facility] Clinical Social Work  Clinical Social Work was referred through rounding for assessment of psychosocial needs due to questions about social security disability.  Clinical Social Worker met with or patient at [facility] to offer support and assess for needs.  Pt reports she had been working, but due to treatment she has been on STD through her work. She is considering applying for actual ss disability and is concerned her insurance will end when she loses her job. CSW encouraged her to apply for assistance and ss disability and provided her with information about Cancer Care assistance and ss disability. CSW met with pt's sister as well and explained process to apply for both. Pt plans to review. Pt denied other concerns. CSW will continue to follow and assist.   Clinical Social Work interventions: Emotional support Resource assistance and education  [name], [place] Tuesdays 8:30-1pm Wednesdays 8:30-12pm  [phone]

## 2014-10-14 NOTE — Progress Notes (Signed)
[facility]  OFFICE PROGRESS NOTE  [place], PA-C [address] [place] [address]  DIAGNOSIS: Cancer of upper lobe of right lung - Plan: CBC with Differential, Comprehensive metabolic panel, fentaNYL (DURAGESIC - DOSED MCG/HR) 12 MCG/HR, fentaNYL (DURAGESIC - DOSED MCG/HR) 25 MCG/HR patch, Oxycodone HCl 10 MG TABS, CANCELED: CT Abdomen Pelvis W Contrast, CANCELED: CT Chest W Contrast, CANCELED: Consult to radiation oncology  Brachial plexus neuralgia  Metastasis to retroperitoneal lymph node  Chief Complaint  Patient presents with  . Pancoast tumor, stage IV    CURRENT THERAPY: Cisplatin/methotrexate/Avastin every 3 weeks started on 09/02/2014  INTERVAL HISTORY: Melissa Sullivan 58 y.o. female returns for  followup and continuation of treatment for stage IV adenocarcinoma of the lung, Pancoast tumor on the right with right brachio-plexopathy and metastases to the mediastinum, chest wall, retroperitoneal lymph nodes, and bilateral adrenal glands.  Right upper extremity pain is controlled with fentanyl 37 every 3 days along with one or 2 oxycodone 10 mg per day. Previously noted numbness of the right upper extremity is gone. Patient started radiation therapy one week ago and was told that her CT scan for planning showed improvement. She denies a diarrhea, constipation, lower extremity swelling or redness, cough, wheezing, sore throat, and is now only smoking 3 or 4 cigarettes per day.  MEDICAL HISTORY: Past Medical History  Diagnosis Date  . Hypertension   . Reflux   . Hyperlipidemia   . Lung mass 07/28/2014  . Adrenal mass, right 07/28/2014    INTERIM HISTORY: has Adrenal mass, right and Cancer of upper lobe of right lung on her problem list.     Cancer of upper lobe of right lung   07/28/2014 Imaging CT chest: Large R apical mass consistent with malignancy. This is destroying the R 2nd rib with extension into adjacent soft tissue.  R hilar adenopathy with R 5cm adrenal metastatic lesion.   08/01/2014 Initial Biopsy Lung, needle/core biopsy(ies), right upper lobe - POORLY DIFFERENTIATED ADENOCARCINOMA, SEE COMMENT.   08/08/2014 PET scan Large hypermetabolic R apical mass with evidence of direct chest wall and mediastinal invasion, right retrocrural lymphadenopathy, extensive retroperitoneal lymphadenopathy, and metastatic lesions to the adrenal glands    09/02/2014 -  Chemotherapy Cisplatin/Pemetrexed/Avastin every 21 days    ALLERGIES:  has No Known Allergies.  MEDICATIONS: has a current medication list which includes the following prescription(s): dexamethasone, fentanyl, fentanyl, folic acid, hydrochlorothiazide, ibuprofen, fusion plus, lidocaine-prilocaine, megestrol, metoclopramide, omeprazole, oxycodone hcl, prochlorperazine, senna, simvastatin, and metformin, and the following Facility-Administered Medications: bevacizumab (AVASTIN) 650 mg in sodium chloride 0.9 % 100 mL chemo infusion, CISplatin (PLATINOL) 100 mg in sodium chloride 0.9 % 250 mL chemo infusion, cyanocobalamin, fosaprepitant (EMEND) 150 mg, dexamethasone (DECADRON) 12 mg in sodium chloride 0.9 % 145 mL IVPB, heparin lock flush, palonosetron, PEMEtrexed (ALIMTA) 675 mg in sodium chloride 0.9 % 100 mL chemo infusion, and sodium chloride.  SURGICAL HISTORY:  Past Surgical History  Procedure Laterality Date  . Appendectomy    . Lung biopsy Right 07/2014    CT guided  . Portacath placement Left 09/01/2014    FAMILY HISTORY: family history includes Cancer in her sister.  SOCIAL HISTORY:  reports that she has been smoking Cigarettes.  She has been smoking about 0.30 packs per day. She has never used smokeless tobacco. She reports that she does not drink alcohol or use illicit drugs.  REVIEW OF SYSTEMS:  Other than that discussed above  is noncontributory.  PHYSICAL EXAMINATION: ECOG PERFORMANCE STATUS: 1 - Symptomatic but completely ambulatory  There were  no vitals taken for this visit.  GENERAL:alert, no distress and comfortable. Alopecia of the scalp. SKIN: skin color, texture, turgor are normal, no rashes or significant lesions EYES: PERLA; Conjunctiva are pink and non-injected, sclera clear SINUSES: No redness or tenderness over maxillary or ethmoid sinuses OROPHARYNX:no exudate, no erythema on lips, buccal mucosa, or tongue. NECK: supple, thyroid normal size, non-tender, without nodularity. No masses CHEST: Increased AP diameter with light port in place on the left anterior chest. LYMPH:  no palpable lymphadenopathy in the cervical, axillary or inguinal LUNGS: clear to auscultation and percussion with normal breathing effort HEART: regular rate & rhythm and no murmurs. ABDOMEN:abdomen soft, non-tender and normal bowel sounds MUSCULOSKELETAL:no cyanosis of digits and no clubbing. Range of motion normal.  NEURO: alert & oriented x 3 with fluent speech, no focal motor/sensory deficits   LABORATORY DATA: Infusion on 10/14/2014  Component Date Value Ref Range Status  . WBC 10/14/2014 13.5* 4.0 - 10.5 K/uL Final  . RBC 10/14/2014 3.36* 3.87 - 5.11 MIL/uL Final  . Hemoglobin 10/14/2014 9.8* 12.0 - 15.0 g/dL Final  . HCT 10/14/2014 28.7* 36.0 - 46.0 % Final  . MCV 10/14/2014 85.4  78.0 - 100.0 fL Final  . MCH 10/14/2014 29.2  26.0 - 34.0 pg Final  . MCHC 10/14/2014 34.1  30.0 - 36.0 g/dL Final  . RDW 10/14/2014 16.7* 11.5 - 15.5 % Final  . Platelets 10/14/2014 599* 150 - 400 K/uL Final  . Neutrophils Relative % 10/14/2014 86* 43 - 77 % Final  . Neutro Abs 10/14/2014 11.5* 1.7 - 7.7 K/uL Final  . Lymphocytes Relative 10/14/2014 7* 12 - 46 % Final  . Lymphs Abs 10/14/2014 1.0  0.7 - 4.0 K/uL Final  . Monocytes Relative 10/14/2014 7  3 - 12 % Final  . Monocytes Absolute 10/14/2014 1.0  0.1 - 1.0 K/uL Final  . Eosinophils Relative 10/14/2014 0  0 - 5 % Final  . Eosinophils Absolute 10/14/2014 0.0  0.0 - 0.7 K/uL Final  . Basophils  Relative 10/14/2014 0  0 - 1 % Final  . Basophils Absolute 10/14/2014 0.0  0.0 - 0.1 K/uL Final  . Sodium 10/14/2014 128* 137 - 147 mEq/L Final  . Potassium 10/14/2014 4.5  3.7 - 5.3 mEq/L Final  . Chloride 10/14/2014 90* 96 - 112 mEq/L Final  . CO2 10/14/2014 23  19 - 32 mEq/L Final  . Glucose, Bld 10/14/2014 387* 70 - 99 mg/dL Final  . BUN 10/14/2014 17  6 - 23 mg/dL Final  . Creatinine, Ser 10/14/2014 0.81  0.50 - 1.10 mg/dL Final  . Calcium 10/14/2014 10.4  8.4 - 10.5 mg/dL Final  . Total Protein 10/14/2014 8.1  6.0 - 8.3 g/dL Final  . Albumin 10/14/2014 3.1* 3.5 - 5.2 g/dL Final  . AST 10/14/2014 18  0 - 37 U/L Final  . ALT 10/14/2014 27  0 - 35 U/L Final  . Alkaline Phosphatase 10/14/2014 144* 39 - 117 U/L Final  . Total Bilirubin 10/14/2014 <0.2* 0.3 - 1.2 mg/dL Final  . GFR calc non Af Amer 10/14/2014 79* >90 mL/min Final  . GFR calc Af Amer 10/14/2014 >90  >90 mL/min Final   Comment: (NOTE) The eGFR has been calculated using the CKD EPI equation. This calculation has not been validated in all clinical situations. eGFR's persistently <90 mL/min signify possible Chronic Kidney Disease.   Georgiann Hahn  gap 10/14/2014 15  5 - 15 Final  . Specific Gravity, Urine 10/14/2014 1.010  1.005 - 1.030 Final  . pH 10/14/2014 6.5  5.0 - 8.0 Final  . Glucose, UA 10/14/2014 500* NEGATIVE mg/dL Final  . Hgb urine dipstick 10/14/2014 NEGATIVE  NEGATIVE Final  . Bilirubin Urine 10/14/2014 NEGATIVE  NEGATIVE Final  . Ketones, ur 10/14/2014 NEGATIVE  NEGATIVE mg/dL Final  . Protein, ur 10/14/2014 NEGATIVE  NEGATIVE mg/dL Final  . Urobilinogen, UA 10/14/2014 0.2  0.0 - 1.0 mg/dL Final  . Nitrite 10/14/2014 NEGATIVE  NEGATIVE Final  . Leukocytes, UA 10/14/2014 NEGATIVE  NEGATIVE Final  Infusion on 09/23/2014  Component Date Value Ref Range Status  . WBC 09/23/2014 27.0* 4.0 - 10.5 K/uL Final  . RBC 09/23/2014 3.30* 3.87 - 5.11 MIL/uL Final  . Hemoglobin 09/23/2014 9.7* 12.0 - 15.0 g/dL Final  .  HCT 09/23/2014 29.2* 36.0 - 46.0 % Final  . MCV 09/23/2014 88.5  78.0 - 100.0 fL Final  . MCH 09/23/2014 29.4  26.0 - 34.0 pg Final  . MCHC 09/23/2014 33.2  30.0 - 36.0 g/dL Final  . RDW 09/23/2014 15.5  11.5 - 15.5 % Final  . Platelets 09/23/2014 751* 150 - 400 K/uL Final  . Neutrophils Relative % 09/23/2014 86* 43 - 77 % Final  . Neutro Abs 09/23/2014 23.2* 1.7 - 7.7 K/uL Final  . Lymphocytes Relative 09/23/2014 10* 12 - 46 % Final  . Lymphs Abs 09/23/2014 2.7  0.7 - 4.0 K/uL Final  . Monocytes Relative 09/23/2014 4  3 - 12 % Final  . Monocytes Absolute 09/23/2014 1.0  0.1 - 1.0 K/uL Final  . Eosinophils Relative 09/23/2014 0  0 - 5 % Final  . Eosinophils Absolute 09/23/2014 0.0  0.0 - 0.7 K/uL Final  . Basophils Relative 09/23/2014 0  0 - 1 % Final  . Basophils Absolute 09/23/2014 0.1  0.0 - 0.1 K/uL Final  . Sodium 09/23/2014 135* 137 - 147 mEq/L Final  . Potassium 09/23/2014 4.6  3.7 - 5.3 mEq/L Final  . Chloride 09/23/2014 97  96 - 112 mEq/L Final  . CO2 09/23/2014 25  19 - 32 mEq/L Final  . Glucose, Bld 09/23/2014 188* 70 - 99 mg/dL Final  . BUN 09/23/2014 14  6 - 23 mg/dL Final  . Creatinine, Ser 09/23/2014 0.80  0.50 - 1.10 mg/dL Final  . Calcium 09/23/2014 10.8* 8.4 - 10.5 mg/dL Final  . Total Protein 09/23/2014 8.2  6.0 - 8.3 g/dL Final  . Albumin 09/23/2014 2.8* 3.5 - 5.2 g/dL Final  . AST 09/23/2014 13  0 - 37 U/L Final  . ALT 09/23/2014 15  0 - 35 U/L Final  . Alkaline Phosphatase 09/23/2014 131* 39 - 117 U/L Final  . Total Bilirubin 09/23/2014 <0.2* 0.3 - 1.2 mg/dL Final  . GFR calc non Af Amer 09/23/2014 80* >90 mL/min Final  . GFR calc Af Amer 09/23/2014 >90  >90 mL/min Final   Comment: (NOTE) The eGFR has been calculated using the CKD EPI equation. This calculation has not been validated in all clinical situations. eGFR's persistently <90 mL/min signify possible Chronic Kidney Disease.   . Anion gap 09/23/2014 13  5 - 15 Final  . Specific Gravity, Urine  09/23/2014 1.020  1.005 - 1.030 Final  . pH 09/23/2014 5.5  5.0 - 8.0 Final  . Glucose, UA 09/23/2014 NEGATIVE  NEGATIVE mg/dL Final  . Hgb urine dipstick 09/23/2014 NEGATIVE  NEGATIVE Final  . Bilirubin Urine  09/23/2014 NEGATIVE  NEGATIVE Final  . Ketones, ur 09/23/2014 TRACE* NEGATIVE mg/dL Final  . Protein, ur 09/23/2014 30* NEGATIVE mg/dL Final  . Urobilinogen, UA 09/23/2014 0.2  0.0 - 1.0 mg/dL Final  . Nitrite 09/23/2014 NEGATIVE  NEGATIVE Final  . Leukocytes, UA 09/23/2014 NEGATIVE  NEGATIVE Final    PATHOLOGY: Adenocarcinoma.  Urinalysis    Component Value Date/Time   LABSPEC 1.010 10/14/2014 0850   PHURINE 6.5 10/14/2014 0850   GLUCOSEU 500* 10/14/2014 0850   HGBUR NEGATIVE 10/14/2014 0850   BILIRUBINUR NEGATIVE 10/14/2014 0850   KETONESUR NEGATIVE 10/14/2014 0850   PROTEINUR NEGATIVE 10/14/2014 0850   UROBILINOGEN 0.2 10/14/2014 0850   NITRITE NEGATIVE 10/14/2014 0850   LEUKOCYTESUR NEGATIVE 10/14/2014 0850    RADIOGRAPHIC STUDIES: No results found.  ASSESSMENT:  1. Stage IV adenocarcinoma lung presenting as a Pancoast tumor with right brachial plexus neuralgia with metastases to mediastinum, chest wall, and retroperitoneal lymph nodes plus bilateral adrenal metastases, good tolerance of initial chemotherapy.. Start on radiation therapy one week ago and tolerating well 2. Chronic obstructive pulmonary disease.  3. Gastroesophageal reflux disease. 4. Right brachial plexopathy, vastly improved. 5. Glucose intolerance with blood sugar of 387.   PLAN:  #1. Continue daily radiotherapy. #2. Cisplatin/pemetrexed/Avastin cycle 3 today. #3. Continue fentanyl patches plus oxycodone for breakthrough pain. #4. No need for nicotine patch. #5. Begin metformin 500 mg twice a day. #5. Follow-up in 3 weeks to receive cycle #4 of a planned 6. CT chest abdomen and pelvis should be done after the fourth cycle. Apparently only a CT of the chest was done in planning for radiation  treatment to the brachial plexus area.   All questions were answered. The patient knows to call the clinic with any problems, questions or concerns. We can certainly see the patient much sooner if necessary.   I spent 25 minutes counseling the patient face to face. The total time spent in the appointment was 30 minutes.    Doroteo Bradford, MD 10/14/2014 10:38 AM  DISCLAIMER:  This note was dictated with voice recognition software.  Similar sounding words can inadvertently be transcribed inaccurately and may not be corrected upon review.

## 2014-10-14 NOTE — Progress Notes (Signed)
Patient tolerated chemotherapy well.

## 2014-10-14 NOTE — Patient Instructions (Signed)
[facility] Discharge Instructions for Patients Receiving Chemotherapy  Today you received the following chemotherapy agents Avastin, Alimta and Cisplatin.  To help prevent nausea and vomiting after your treatment, we encourage you to take your nausea medication as instructed.   If you develop nausea and vomiting that is not controlled by your nausea medication, call the clinic. If it is after clinic hours your family physician or the after hours number for the clinic or go to the Emergency Department.   BELOW ARE SYMPTOMS THAT SHOULD BE REPORTED IMMEDIATELY:  *FEVER GREATER THAN 101.0 F  *CHILLS WITH OR WITHOUT FEVER  NAUSEA AND VOMITING THAT IS NOT CONTROLLED WITH YOUR NAUSEA MEDICATION  *UNUSUAL SHORTNESS OF BREATH  *UNUSUAL BRUISING OR BLEEDING  TENDERNESS IN MOUTH AND THROAT WITH OR WITHOUT PRESENCE OF ULCERS  *URINARY PROBLEMS  *BOWEL PROBLEMS  UNUSUAL RASH Items with * indicate Melissa potential emergency and should be followed up as soon as possible.  Melissa Sullivan prescription for Metformin 500 mg take one tablet 2 times daily (one in the am, one in the pm) was sent to your pharmacy. Please pick up this medication and begin taking it as soon as possible. (This medication will help lower your blood glucose (sugar), it has increased as Melissa result of taking dexamethasone and megace.)   I have been informed and understand all the instructions given to me. I know to contact the clinic, my physician, or go to the Emergency Department if any problems should occur. I do not have any questions at this time, but understand that I may call the clinic during office hours or the Patient Navigator at [phone] should I have any questions or need assistance in obtaining follow up care.    __________________________________________  _____________  __________ Signature of Patient or Authorized Representative            Date                    Time    __________________________________________ Nurse's Signature

## 2014-10-14 NOTE — Patient Instructions (Signed)
[facility] Discharge Instructions  RECOMMENDATIONS MADE BY THE CONSULTANT AND ANY TEST RESULTS WILL BE SENT TO YOUR REFERRING PHYSICIAN.  EXAM FINDINGS BY THE PHYSICIAN TODAY AND SIGNS OR SYMPTOMS TO REPORT TO CLINIC OR PRIMARY PHYSICIAN:  You saw Dr Barnet Glasgow today   Continue daily radiotherapy.  Cisplatin/pemetrexed/Avastin cycle 3 today.  Continue fentanyl patches plus oxycodone for breakthrough pain.  No need for nicotine patch.  Follow-up in 3 weeks to receive cycle #4 of a planned 6. CT chest abdomen and pelvis should be done after the fourth cycle. Apparently only a CT of the chest was done in planning for radiation  treatment to the brachial plexus area  Please call the clinic if you have any questions or concerns    Thank you for choosing [facility] to provide your oncology and hematology care.  To afford each patient quality time with our providers, please arrive at least 15 minutes before your scheduled appointment time.  With your help, our goal is to use those 15 minutes to complete the necessary work-up to ensure our physicians have the information they need to help with your evaluation and healthcare recommendations.    Effective January 1st, 2014, we ask that you re-schedule your appointment with our physicians should you arrive 10 or more minutes late for your appointment.  We strive to give you quality time with our providers, and arriving late affects you and other patients whose appointments are after yours.    Again, thank you for choosing [facility].  Our hope is that these requests will decrease the amount of time that you wait before being seen by our physicians.       _____________________________________________________________  Should you have questions after your visit to [facility], please contact our office at [phone] between the hours of 8:30 a.m. and 5:00 p.m.  Voicemails left after 4:30 p.m.  will not be returned until the following business day.  For prescription refill requests, have your pharmacy contact our office with your prescription refill request.

## 2014-10-14 NOTE — Progress Notes (Signed)
Leonard Downing presents today for injection per MD orders. B12 1000 mcg administered IM in left Upper Arm. Administration without incident. Patient tolerated well.

## 2014-10-21 ENCOUNTER — Emergency Department (HOSPITAL_COMMUNITY): Payer: PRIVATE HEALTH INSURANCE

## 2014-10-21 ENCOUNTER — Emergency Department (HOSPITAL_COMMUNITY)
Admission: EM | Admit: 2014-10-21 | Discharge: 2014-10-21 | Disposition: A | Discharge status: Home / Self Care | Payer: PRIVATE HEALTH INSURANCE | Attending: Emergency Medicine | Admitting: Emergency Medicine

## 2014-10-21 ENCOUNTER — Encounter (HOSPITAL_COMMUNITY): Payer: Self-pay | Admitting: Emergency Medicine

## 2014-10-21 DIAGNOSIS — Z79899 Other long term (current) drug therapy: Secondary | ICD-10-CM | POA: Insufficient documentation

## 2014-10-21 DIAGNOSIS — Z720 Tobacco use: Secondary | ICD-10-CM | POA: Diagnosis not present

## 2014-10-21 DIAGNOSIS — K219 Gastro-esophageal reflux disease without esophagitis: Secondary | ICD-10-CM | POA: Insufficient documentation

## 2014-10-21 DIAGNOSIS — R05 Cough: Secondary | ICD-10-CM | POA: Diagnosis not present

## 2014-10-21 DIAGNOSIS — R059 Cough, unspecified: Secondary | ICD-10-CM

## 2014-10-21 DIAGNOSIS — R070 Pain in throat: Secondary | ICD-10-CM | POA: Diagnosis not present

## 2014-10-21 DIAGNOSIS — R1110 Vomiting, unspecified: Secondary | ICD-10-CM | POA: Diagnosis present

## 2014-10-21 DIAGNOSIS — I10 Essential (primary) hypertension: Secondary | ICD-10-CM | POA: Diagnosis not present

## 2014-10-21 DIAGNOSIS — E785 Hyperlipidemia, unspecified: Secondary | ICD-10-CM | POA: Insufficient documentation

## 2014-10-21 LAB — CBC WITH DIFFERENTIAL/PLATELET
BASOS ABS: 0 10*3/uL (ref 0.0–0.1)
BASOS PCT: 1 % (ref 0–1)
Eosinophils Absolute: 0 10*3/uL (ref 0.0–0.7)
Eosinophils Relative: 1 % (ref 0–5)
HCT: 35 % — ABNORMAL LOW (ref 36.0–46.0)
Hemoglobin: 11.8 g/dL — ABNORMAL LOW (ref 12.0–15.0)
Lymphocytes Relative: 13 % (ref 12–46)
Lymphs Abs: 0.4 10*3/uL — ABNORMAL LOW (ref 0.7–4.0)
MCH: 28.9 pg (ref 26.0–34.0)
MCHC: 33.7 g/dL (ref 30.0–36.0)
MCV: 85.6 fL (ref 78.0–100.0)
MONOS PCT: 13 % — AB (ref 3–12)
Monocytes Absolute: 0.4 10*3/uL (ref 0.1–1.0)
NEUTROS PCT: 72 % (ref 43–77)
Neutro Abs: 2.4 10*3/uL (ref 1.7–7.7)
Platelets: 224 10*3/uL (ref 150–400)
RBC: 4.09 MIL/uL (ref 3.87–5.11)
RDW: 16.6 % — AB (ref 11.5–15.5)
WBC: 3.3 10*3/uL — ABNORMAL LOW (ref 4.0–10.5)

## 2014-10-21 LAB — COMPREHENSIVE METABOLIC PANEL
ALT: 13 U/L (ref 0–35)
ANION GAP: 14 (ref 5–15)
AST: 17 U/L (ref 0–37)
Albumin: 3.4 g/dL — ABNORMAL LOW (ref 3.5–5.2)
Alkaline Phosphatase: 108 U/L (ref 39–117)
BILIRUBIN TOTAL: 0.5 mg/dL (ref 0.3–1.2)
BUN: 26 mg/dL — AB (ref 6–23)
CALCIUM: 11.3 mg/dL — AB (ref 8.4–10.5)
CO2: 28 mEq/L (ref 19–32)
Chloride: 95 mEq/L — ABNORMAL LOW (ref 96–112)
Creatinine, Ser: 1.08 mg/dL (ref 0.50–1.10)
GFR, EST AFRICAN AMERICAN: 64 mL/min — AB (ref >90)
GFR, EST NON AFRICAN AMERICAN: 55 mL/min — AB (ref >90)
Glucose, Bld: 107 mg/dL — ABNORMAL HIGH (ref 70–99)
Potassium: 5 mEq/L (ref 3.7–5.3)
Sodium: 137 mEq/L (ref 137–147)
Total Protein: 8.7 g/dL — ABNORMAL HIGH (ref 6.0–8.3)

## 2014-10-21 LAB — RAPID STREP SCREEN (MED CTR MEBANE ONLY): Streptococcus, Group A Screen (Direct): NEGATIVE

## 2014-10-21 MED ORDER — ONDANSETRON HCL 4 MG/2ML IJ SOLN
INTRAMUSCULAR | Status: AC
  Filled 2014-10-21: qty 2

## 2014-10-21 MED ORDER — MAGIC MOUTHWASH W/LIDOCAINE: 5.0000 mL | Freq: Four times a day (QID) | ORAL | Status: DC | PRN

## 2014-10-21 MED ORDER — SODIUM CHLORIDE 0.9 % IV BOLUS (SEPSIS)
1000.0000 mL | Freq: Once | INTRAVENOUS | Status: AC
  Administered 2014-10-21: 1000 mL via INTRAVENOUS

## 2014-10-21 MED ORDER — ONDANSETRON HCL 4 MG/2ML IJ SOLN
4.0000 mg | Freq: Once | INTRAMUSCULAR | Status: AC
  Administered 2014-10-21: 4 mg via INTRAVENOUS

## 2014-10-21 MED ORDER — IOHEXOL 300 MG/ML  SOLN
75.0000 mL | Freq: Once | INTRAMUSCULAR | Status: AC | PRN
  Administered 2014-10-21: 75 mL via INTRAVENOUS

## 2014-10-21 MED ORDER — HYDROMORPHONE HCL 1 MG/ML IJ SOLN
1.0000 mg | Freq: Once | INTRAMUSCULAR | Status: AC
  Administered 2014-10-21: 1 mg via INTRAVENOUS
  Filled 2014-10-21: qty 1

## 2014-10-21 NOTE — Discharge Instructions (Signed)
Follow up with your md as planned.  Speak to the radiation doctor if you have any more problems

## 2014-10-21 NOTE — ED Notes (Signed)
Pt reports sore throat and vomiting since Monday. Pt reports vomited blood this am. Pt denies any dizziness. Pt reports is recieving tx for stage 4 lung cancer. nad noted.

## 2014-10-21 NOTE — ED Provider Notes (Signed)
CSN: [id number]     Arrival date & time 10/21/14  0909 History  This chart was scribed for Maudry Diego, MD by Rayfield Citizen, ED Scribe. This patient was seen in room APA19/APA19 and the patient's care was started at 9:14 AM.    Chief Complaint  Patient presents with  . Emesis   Patient is a 58 y.o. female presenting with pharyngitis. The history is provided by the patient (pt complains of sore throat). No language interpreter was used.  Sore Throat This is a new problem. The current episode started more than 2 days ago. The problem occurs constantly. The problem has not changed since onset.Pertinent negatives include no chest pain. The symptoms are aggravated by swallowing. Nothing relieves the symptoms. She has tried nothing for the symptoms.     HPI Comments: Melissa Sullivan is a 58 y.o. female who presents to the Emergency Department complaining of 4 days of sore throat; she states that she coughed up blood this morning. She denies fevers or chills.   Patient is currently being treated for stage IV lung cancer.   Past Medical History  Diagnosis Date  . Hypertension   . Reflux   . Hyperlipidemia   . Lung mass 07/28/2014  . Adrenal mass, right 07/28/2014   Past Surgical History  Procedure Laterality Date  . Appendectomy    . Lung biopsy Right 07/2014    CT guided  . Portacath placement Left 09/01/2014   Family History  Problem Relation Age of Onset  . Cancer Sister    History  Substance Use Topics  . Smoking status: Light Tobacco Smoker -- 0.30 packs/day    Types: Cigarettes  . Smokeless tobacco: Never Used  . Alcohol Use: No   OB History    No data available     Review of Systems  Constitutional: Negative for appetite change and fatigue.  HENT: Positive for sore throat. Negative for congestion, ear discharge and sinus pressure.   Eyes: Negative for discharge.  Respiratory: Negative for cough.   Cardiovascular: Negative for chest pain.  Genitourinary: Negative for  frequency and hematuria.  Musculoskeletal: Negative for back pain.  Skin: Negative for rash.  Neurological: Negative for seizures.  Psychiatric/Behavioral: Negative for hallucinations.   Allergies  Review of patient's allergies indicates no known allergies.  Home Medications   Prior to Admission medications   Medication Sig Start Date End Date Taking? Authorizing Provider  dexamethasone (DECADRON) 4 MG tablet The day before, day of, and day after chemo take 2 tabs in the am and 2 tabs in the pm. 08/19/14   Farrel Gobble, MD  fentaNYL (DURAGESIC - DOSED MCG/HR) 12 MCG/HR Place 1 patch (12.5 mcg total) onto the skin every 3 (three) days. 10/14/14   Farrel Gobble, MD  fentaNYL (DURAGESIC - DOSED MCG/HR) 25 MCG/HR patch Place 1 patch (25 mcg total) onto the skin every 3 (three) days. 10/14/14   Farrel Gobble, MD  folic acid (FOLVITE) 1 MG tablet Take 1 mg by mouth every morning.    Historical Provider, MD  hydrochlorothiazide (MICROZIDE) 12.5 MG capsule Take 12.5 mg by mouth every morning.     Historical Provider, MD  ibuprofen (ADVIL,MOTRIN) 200 MG tablet Take 200-800 mg by mouth once as needed for headache or mild pain.    Historical Provider, MD  Iron-FA-B Cmp-C-Biot-Probiotic (FUSION PLUS) CAPS Take by mouth.    Historical Provider, MD  lidocaine-prilocaine (EMLA) cream Apply a quarter size amount to port site 1 hour prior to  chemo. Do not rub in. Cover with plastic wrap. 08/19/14   Farrel Gobble, MD  megestrol (MEGACE) 40 MG/ML suspension Take by mouth daily.    Historical Provider, MD  metFORMIN (GLUCOPHAGE) 500 MG tablet Take 1 tablet (500 mg total) by mouth 2 (two) times daily with a meal. 10/14/14   Farrel Gobble, MD  metoCLOPramide (REGLAN) 5 MG tablet Starting the day after chemo, take 1 tab four times a day x 2 days. Then may take 1 tab four times a day IF needed for nausea/vomiting. 08/19/14   Farrel Gobble, MD  omeprazole (PRILOSEC) 20 MG capsule Take 20 mg by mouth  every morning.     Historical Provider, MD  Oxycodone HCl 10 MG TABS Take 1-2 tablets (10-20 mg total) by mouth every 6 (six) hours as needed (pain.). 10/14/14   Farrel Gobble, MD  prochlorperazine (COMPAZINE) 10 MG tablet Starting the day after chemo, take 1 tab four times a day x 2 days. Then may take 1 tab four times a day IF needed for nausea/vomiting. 08/19/14   Farrel Gobble, MD  senna (SENOKOT) 8.6 MG tablet Take 1 tablet by mouth at bedtime.    Historical Provider, MD  simvastatin (ZOCOR) 40 MG tablet Take 40 mg by mouth every morning.     Historical Provider, MD   Ht 4\' 11"  (1.499 m)  Wt 91 lb (41.277 kg)  BMI 18.37 kg/m2 Physical Exam  Constitutional: She is oriented to person, place, and time. She appears well-developed.  Cachectic   HENT:  Head: Normocephalic.  Eyes: Conjunctivae and EOM are normal. No scleral icterus.  Neck: Neck supple. No thyromegaly present.  Tenderness bilaterally to anterior neck  Cardiovascular: Normal rate and regular rhythm.  Exam reveals no gallop and no friction rub.   No murmur heard. Pulmonary/Chest: No stridor. She has no wheezes. She has no rales. She exhibits no tenderness.  Abdominal: She exhibits no distension. There is no tenderness. There is no rebound.  Musculoskeletal: Normal range of motion. She exhibits no edema.  Lymphadenopathy:    She has no cervical adenopathy.  Neurological: She is oriented to person, place, and time. She exhibits normal muscle tone. Coordination normal.  Skin: No rash noted. No erythema.  Psychiatric: She has a normal mood and affect. Her behavior is normal.    ED Course  Procedures   DIAGNOSTIC STUDIES: Oxygen Saturation is 99% on RA, normal by my interpretation.    COORDINATION OF CARE: 9:19 AM Discussed treatment plan with pt at bedside and pt agreed to plan.    Labs Review Labs Reviewed - No data to display  Imaging Review No results found.   EKG Interpretation None      MDM    Final diagnoses:  None    Spoke with onc.   Pt to be treated with magic mouth wash  The chart was scribed for me under my direct supervision.  I personally performed the history, physical, and medical decision making and all procedures in the evaluation of this patient.Maudry Diego, MD 10/21/14 [phone]

## 2014-10-22 LAB — CULTURE, GROUP A STREP

## 2014-10-23 ENCOUNTER — Telehealth (HOSPITAL_BASED_OUTPATIENT_CLINIC_OR_DEPARTMENT_OTHER): Payer: Self-pay | Admitting: Emergency Medicine

## 2014-10-23 NOTE — Telephone Encounter (Signed)
Post ED Visit - Positive Culture Follow-up: Successful Patient Follow-Up  Culture assessed and recommendations reviewed by: []  Wes Tilden, Pharm.D., BCPS []  Heide Guile, Pharm.D., BCPS [x]  Alycia Rossetti, Pharm.D., BCPS []  [place], Pharm.D., BCPS, AAHIVP []  Legrand Como, Pharm.D., BCPS, AAHIVP []  Hassie Bruce, Pharm.D. []  Cassie Nicole Kindred, [place].D.  Positive strep culture  [x]  Patient discharged without antimicrobial prescription and treatment is now indicated []  Organism is resistant to prescribed ED discharge antimicrobial []  Patient with positive blood cultures  Changes discussed with ED provider:[place] PA New antibiotic prescription Penicillin VK 500mg  po bid x 10 days Called to [facility]  [phone]  Contacted patient, date 10/23/2014, time [place], Scotty Weigelt 10/23/2014, 6:34 PM

## 2014-10-23 NOTE — Progress Notes (Signed)
ED Antimicrobial Stewardship Positive Culture Follow Up   Melissa Sullivan is an 58 y.o. female who presented to [facility] on 10/21/2014 with a chief complaint of sore throat  Chief Complaint  Patient presents with  . Emesis    Recent Results (from the past 720 hour(s))  Rapid strep screen     Status: None   Collection Time: 10/21/14  9:48 AM  Result Value Ref Range Status   Streptococcus, Group A Screen (Direct) NEGATIVE NEGATIVE Final    Comment: (NOTE) A Rapid Antigen test may result negative if the antigen level in the sample is below the detection level of this test. The FDA has not cleared this test as a stand-alone test therefore the rapid antigen negative result has reflexed to a Group A Strep culture.   Culture, Group A Strep     Status: None   Collection Time: 10/21/14  9:48 AM  Result Value Ref Range Status   Specimen Description THROAT  Final   Special Requests NONE  Final   Culture   Final    GROUP A STREP (S.PYOGENES) ISOLATED Performed at [facility]    Report Status 10/22/2014 FINAL  Final    [x]  Patient discharged originally without antimicrobial agent and treatment is now indicated  [name] with stage IV lung CA who presented on 12/1 with sore throat, rapid strep negative, however the patient grew out Group A Strep.  New antibiotic prescription: Penicillin VK 500 mg po bid x 10 days  ED Provider: Carlisle Cater, PA-C  Lawson Radar 10/23/2014, 11:38 AM Infectious Diseases Pharmacist Phone# [phone]

## 2014-11-04 ENCOUNTER — Encounter (HOSPITAL_COMMUNITY): Payer: Self-pay

## 2014-11-04 ENCOUNTER — Encounter (HOSPITAL_COMMUNITY): Payer: PRIVATE HEALTH INSURANCE | Attending: Hematology and Oncology

## 2014-11-04 ENCOUNTER — Encounter (HOSPITAL_BASED_OUTPATIENT_CLINIC_OR_DEPARTMENT_OTHER): Payer: PRIVATE HEALTH INSURANCE

## 2014-11-04 ENCOUNTER — Other Ambulatory Visit (HOSPITAL_COMMUNITY): Payer: Self-pay | Admitting: Hematology and Oncology

## 2014-11-04 ENCOUNTER — Encounter: Payer: Self-pay | Admitting: *Deleted

## 2014-11-04 VITALS — BP 135/84 | HR 90 | Temp 98.3°F | Resp 18

## 2014-11-04 VITALS — BP 110/71 | HR 120 | Temp 98.3°F | Resp 18 | Wt 84.4 lb

## 2014-11-04 DIAGNOSIS — C781 Secondary malignant neoplasm of mediastinum: Secondary | ICD-10-CM | POA: Insufficient documentation

## 2014-11-04 DIAGNOSIS — Z79899 Other long term (current) drug therapy: Secondary | ICD-10-CM | POA: Diagnosis not present

## 2014-11-04 DIAGNOSIS — J449 Chronic obstructive pulmonary disease, unspecified: Secondary | ICD-10-CM | POA: Diagnosis not present

## 2014-11-04 DIAGNOSIS — I10 Essential (primary) hypertension: Secondary | ICD-10-CM | POA: Diagnosis not present

## 2014-11-04 DIAGNOSIS — C7971 Secondary malignant neoplasm of right adrenal gland: Secondary | ICD-10-CM | POA: Diagnosis not present

## 2014-11-04 DIAGNOSIS — C3411 Malignant neoplasm of upper lobe, right bronchus or lung: Secondary | ICD-10-CM | POA: Diagnosis present

## 2014-11-04 DIAGNOSIS — Z5112 Encounter for antineoplastic immunotherapy: Secondary | ICD-10-CM | POA: Diagnosis present

## 2014-11-04 DIAGNOSIS — C772 Secondary and unspecified malignant neoplasm of intra-abdominal lymph nodes: Secondary | ICD-10-CM | POA: Diagnosis not present

## 2014-11-04 DIAGNOSIS — C7989 Secondary malignant neoplasm of other specified sites: Secondary | ICD-10-CM

## 2014-11-04 DIAGNOSIS — C7972 Secondary malignant neoplasm of left adrenal gland: Secondary | ICD-10-CM | POA: Insufficient documentation

## 2014-11-04 DIAGNOSIS — K219 Gastro-esophageal reflux disease without esophagitis: Secondary | ICD-10-CM | POA: Insufficient documentation

## 2014-11-04 DIAGNOSIS — E785 Hyperlipidemia, unspecified: Secondary | ICD-10-CM | POA: Insufficient documentation

## 2014-11-04 DIAGNOSIS — M5412 Radiculopathy, cervical region: Secondary | ICD-10-CM

## 2014-11-04 DIAGNOSIS — F17210 Nicotine dependence, cigarettes, uncomplicated: Secondary | ICD-10-CM | POA: Diagnosis not present

## 2014-11-04 DIAGNOSIS — Z9089 Acquired absence of other organs: Secondary | ICD-10-CM | POA: Diagnosis not present

## 2014-11-04 DIAGNOSIS — C801 Malignant (primary) neoplasm, unspecified: Secondary | ICD-10-CM

## 2014-11-04 DIAGNOSIS — Z5111 Encounter for antineoplastic chemotherapy: Secondary | ICD-10-CM

## 2014-11-04 LAB — COMPREHENSIVE METABOLIC PANEL
ALBUMIN: 3.3 g/dL — AB (ref 3.5–5.2)
ALK PHOS: 93 U/L (ref 39–117)
ALT: 10 U/L (ref 0–35)
ANION GAP: 16 — AB (ref 5–15)
AST: 17 U/L (ref 0–37)
BUN: 21 mg/dL (ref 6–23)
CALCIUM: 11.1 mg/dL — AB (ref 8.4–10.5)
CO2: 24 mEq/L (ref 19–32)
CREATININE: 1.14 mg/dL — AB (ref 0.50–1.10)
Chloride: 96 mEq/L (ref 96–112)
GFR calc non Af Amer: 52 mL/min — ABNORMAL LOW (ref >90)
GFR, EST AFRICAN AMERICAN: 60 mL/min — AB (ref >90)
Glucose, Bld: 142 mg/dL — ABNORMAL HIGH (ref 70–99)
POTASSIUM: 3.8 meq/L (ref 3.7–5.3)
Sodium: 136 mEq/L — ABNORMAL LOW (ref 137–147)
TOTAL PROTEIN: 8.5 g/dL — AB (ref 6.0–8.3)
Total Bilirubin: 0.3 mg/dL (ref 0.3–1.2)

## 2014-11-04 LAB — CBC WITH DIFFERENTIAL/PLATELET
BASOS ABS: 0 10*3/uL (ref 0.0–0.1)
Basophils Relative: 0 % (ref 0–1)
Eosinophils Absolute: 0.1 10*3/uL (ref 0.0–0.7)
Eosinophils Relative: 1 % (ref 0–5)
HEMATOCRIT: 28.9 % — AB (ref 36.0–46.0)
HEMOGLOBIN: 9.7 g/dL — AB (ref 12.0–15.0)
LYMPHS PCT: 18 % (ref 12–46)
Lymphs Abs: 1.3 10*3/uL (ref 0.7–4.0)
MCH: 29.2 pg (ref 26.0–34.0)
MCHC: 33.6 g/dL (ref 30.0–36.0)
MCV: 87 fL (ref 78.0–100.0)
MONOS PCT: 15 % — AB (ref 3–12)
Monocytes Absolute: 1.1 10*3/uL — ABNORMAL HIGH (ref 0.1–1.0)
NEUTROS ABS: 4.6 10*3/uL (ref 1.7–7.7)
Neutrophils Relative %: 66 % (ref 43–77)
Platelets: 372 10*3/uL (ref 150–400)
RBC: 3.32 MIL/uL — ABNORMAL LOW (ref 3.87–5.11)
RDW: 18.5 % — ABNORMAL HIGH (ref 11.5–15.5)
WBC: 7 10*3/uL (ref 4.0–10.5)

## 2014-11-04 LAB — URINALYSIS, DIPSTICK ONLY
Bilirubin Urine: NEGATIVE
Glucose, UA: NEGATIVE mg/dL
HGB URINE DIPSTICK: NEGATIVE
Leukocytes, UA: NEGATIVE
NITRITE: NEGATIVE
PROTEIN: 30 mg/dL — AB
SPECIFIC GRAVITY, URINE: 1.02 (ref 1.005–1.030)
Urobilinogen, UA: 0.2 mg/dL (ref 0.0–1.0)
pH: 6 (ref 5.0–8.0)

## 2014-11-04 LAB — LACTATE DEHYDROGENASE: LDH: 146 U/L (ref 94–250)

## 2014-11-04 MED ORDER — SODIUM CHLORIDE 0.9 % IV SOLN: 15.0000 mg/kg | Freq: Once | INTRAVENOUS | Status: DC

## 2014-11-04 MED ORDER — FENTANYL 25 MCG/HR TD PT72: 25.0000 ug | MEDICATED_PATCH | TRANSDERMAL | Status: DC

## 2014-11-04 MED ORDER — SODIUM CHLORIDE 0.9 % IV SOLN
500.0000 mg/m2 | Freq: Once | INTRAVENOUS | Status: AC
  Administered 2014-11-04: 675 mg via INTRAVENOUS
  Filled 2014-11-04: qty 27

## 2014-11-04 MED ORDER — PALONOSETRON HCL INJECTION 0.25 MG/5ML
0.2500 mg | Freq: Once | INTRAVENOUS | Status: AC
  Administered 2014-11-04: 0.25 mg via INTRAVENOUS
  Filled 2014-11-04: qty 5

## 2014-11-04 MED ORDER — SODIUM CHLORIDE 0.9 % IV SOLN
Freq: Once | INTRAVENOUS | Status: AC
  Administered 2014-11-04: 10:00:00 via INTRAVENOUS
  Filled 2014-11-04: qty 5

## 2014-11-04 MED ORDER — SODIUM CHLORIDE 0.9 % IV SOLN: 150.0000 mg | Freq: Once | INTRAVENOUS | Status: DC

## 2014-11-04 MED ORDER — HEPARIN SOD (PORK) LOCK FLUSH 100 UNIT/ML IV SOLN
500.0000 [IU] | Freq: Once | INTRAVENOUS | Status: AC | PRN
  Administered 2014-11-04: 500 [IU]
  Filled 2014-11-04: qty 5

## 2014-11-04 MED ORDER — MAGIC MOUTHWASH W/LIDOCAINE: 5.0000 mL | Freq: Four times a day (QID) | ORAL | Status: DC | PRN

## 2014-11-04 MED ORDER — SODIUM CHLORIDE 0.9 % IV SOLN
15.0000 mg/kg | Freq: Once | INTRAVENOUS | Status: AC
  Administered 2014-11-04: 575 mg via INTRAVENOUS
  Filled 2014-11-04: qty 23

## 2014-11-04 MED ORDER — CISPLATIN CHEMO INJECTION 100MG/100ML
75.0000 mg/m2 | Freq: Once | INTRAVENOUS | Status: AC
  Administered 2014-11-04: 100 mg via INTRAVENOUS
  Filled 2014-11-04: qty 100

## 2014-11-04 MED ORDER — SODIUM CHLORIDE 0.9 % IV SOLN
Freq: Once | INTRAVENOUS | Status: AC
  Administered 2014-11-04: 09:00:00 via INTRAVENOUS

## 2014-11-04 MED ORDER — CYANOCOBALAMIN 1000 MCG/ML IJ SOLN: 1000.0000 ug | Freq: Once | INTRAMUSCULAR | Status: DC

## 2014-11-04 MED ORDER — SODIUM CHLORIDE 0.9 % IJ SOLN: 10.0000 mL | INTRAMUSCULAR | Status: DC | PRN

## 2014-11-04 MED ORDER — POTASSIUM CHLORIDE 2 MEQ/ML IV SOLN
Freq: Once | INTRAVENOUS | Status: AC
  Administered 2014-11-04: 10:00:00 via INTRAVENOUS
  Filled 2014-11-04: qty 10

## 2014-11-04 MED ORDER — DEXAMETHASONE SODIUM PHOSPHATE 20 MG/5ML IJ SOLN: 12.0000 mg | Freq: Once | INTRAMUSCULAR | Status: DC

## 2014-11-04 MED ORDER — CYANOCOBALAMIN 1000 MCG/ML IJ SOLN
1000.0000 ug | Freq: Once | INTRAMUSCULAR | Status: AC
  Administered 2014-11-04: 1000 ug via INTRAMUSCULAR
  Filled 2014-11-04: qty 1

## 2014-11-04 NOTE — Progress Notes (Signed)
[place]  OFFICE PROGRESS NOTE  [place], PA-C [address] [place] [address]  DIAGNOSIS: Cancer of upper lobe of right lung - Plan: fentaNYL (DURAGESIC - DOSED MCG/HR) 25 MCG/HR patch  Brachial plexus neuralgia  Metastasis to retroperitoneal lymph node  Metastasis to mediastinum  Chief Complaint  Patient presents with  . Stage IV Pancoast tumor right lung    CURRENT THERAPY: Cis-platinum/pemetrexed/Avastin every 3 weeks started on 09/02/2014 with concurrent radiotherapy  to the right lung apex started on 10/07/2014 and completed on 10/27/2014.  INTERVAL HISTORY: Melissa Sullivan 58 y.o. female returns for followup and continuation of treatment for stage IV adenocarcinoma of the lung, Pancoast tumor on the right with right brachio-plexopathy and metastases to the mediastinum, chest wall, retroperitoneal lymph nodes, and bilateral adrenal glands. Plan is to deliver cycle #5 of systemic chemotherapy today. Right upper extremity pain has completely gone using only a total of 20 mg of oxycodone per day. She denies any nausea, vomiting, but did have a sore throat diagnosed in the emergency room earlier this month with streptococcus. She was treated without. She denies any diarrhea, lower extremity swelling or redness, back pain, constipation, skin rash, headache, or seizures. Denies polydipsia, polyuria, and nocturia. He denies peripheral paresthesias.  MEDICAL HISTORY: Past Medical History  Diagnosis Date  . Hypertension   . Reflux   . Hyperlipidemia   . Lung mass 07/28/2014  . Adrenal mass, right 07/28/2014    INTERIM HISTORY: has Adrenal mass, right and Cancer of upper lobe of right lung on her problem list.     Cancer of upper lobe of right lung   07/28/2014 Imaging CT chest: Large R apical mass consistent with malignancy. This is destroying the R 2nd rib with extension into adjacent soft tissue. R hilar adenopathy with R 5cm  adrenal metastatic lesion.   08/01/2014 Initial Biopsy Lung, needle/core biopsy(ies), right upper lobe - POORLY DIFFERENTIATED ADENOCARCINOMA, SEE COMMENT.   08/08/2014 PET scan Large hypermetabolic R apical mass with evidence of direct chest wall and mediastinal invasion, right retrocrural lymphadenopathy, extensive retroperitoneal lymphadenopathy, and metastatic lesions to the adrenal glands    09/02/2014 -  Chemotherapy Cisplatin/Pemetrexed/Avastin every 21 days   10/07/2014 - 10/27/2014 Radiation Therapy Right lung apex for control of brachioplexopathy.    ALLERGIES:  has No Known Allergies.  MEDICATIONS: has a current medication list which includes the following prescription(s): magic mouthwash w/lidocaine, dexamethasone, fentanyl, fentanyl, folic acid, hydrochlorothiazide, ibuprofen, fusion plus, lidocaine-prilocaine, megestrol, metformin, metoclopramide, omeprazole, oxycodone hcl, prochlorperazine, senna, and simvastatin, and the following Facility-Administered Medications: sodium chloride, bevacizumab (AVASTIN) 575 mg in sodium chloride 0.9 % 100 mL chemo infusion, CISplatin (PLATINOL) 100 mg in sodium chloride 0.9 % 250 mL chemo infusion, heparin lock flush, palonosetron, PEMEtrexed (ALIMTA) 675 mg in sodium chloride 0.9 % 100 mL chemo infusion, and sodium chloride.  SURGICAL HISTORY:  Past Surgical History  Procedure Laterality Date  . Appendectomy    . Lung biopsy Right 07/2014    CT guided  . Portacath placement Left 09/01/2014    FAMILY HISTORY: family history includes Cancer in her sister.  SOCIAL HISTORY:  reports that she has been smoking Cigarettes.  She has been smoking about 0.30 packs per day. She has never used smokeless tobacco. She reports that she does not drink alcohol or use illicit drugs.  REVIEW OF SYSTEMS:  Other than that discussed above is noncontributory.  PHYSICAL EXAMINATION: ECOG PERFORMANCE STATUS:  1 - Symptomatic but completely ambulatory  Blood pressure  110/71, pulse 120, temperature 98.3 F (36.8 C), temperature source Oral, resp. rate 18, weight 84 lb 6.4 oz (38.284 kg), SpO2 100 %.  GENERAL:alert, no distress and comfortable SKIN: skin color, texture, turgor are normal, no rashes or significant lesions EYES: PERLA; Conjunctiva are pink and non-injected, sclera clear SINUSES: No redness or tenderness over maxillary or ethmoid sinuses OROPHARYNX:no exudate, no erythema on lips, buccal mucosa, or tongue. NECK: supple, thyroid normal size, non-tender, without nodularity. No masses CHEST: Increased AP diameter with light port in place. LYMPH:  no palpable lymphadenopathy in the cervical, axillary or inguinal LUNGS: clear to auscultation and percussion with normal breathing effort HEART: regular rate & rhythm and no murmurs. ABDOMEN:abdomen soft, non-tender and normal bowel sounds MUSCULOSKELETAL:no cyanosis of digits and no clubbing. Range of motion normal including right upper extremity.Marland Kitchen  NEURO: alert & oriented x 3 with fluent speech, no focal motor/sensory deficits   LABORATORY DATA: Infusion on 11/04/2014  Component Date Value Ref Range Status  . Specific Gravity, Urine 11/04/2014 1.020  1.005 - 1.030 Final  . pH 11/04/2014 6.0  5.0 - 8.0 Final  . Glucose, UA 11/04/2014 NEGATIVE  NEGATIVE mg/dL Final  . Hgb urine dipstick 11/04/2014 NEGATIVE  NEGATIVE Final  . Bilirubin Urine 11/04/2014 NEGATIVE  NEGATIVE Final  . Ketones, ur 11/04/2014 TRACE* NEGATIVE mg/dL Final  . Protein, ur 11/04/2014 30* NEGATIVE mg/dL Final  . Urobilinogen, UA 11/04/2014 0.2  0.0 - 1.0 mg/dL Final  . Nitrite 11/04/2014 NEGATIVE  NEGATIVE Final  . Leukocytes, UA 11/04/2014 NEGATIVE  NEGATIVE Final  . Sodium 11/04/2014 136* 137 - 147 mEq/L Final  . Potassium 11/04/2014 3.8  3.7 - 5.3 mEq/L Final  . Chloride 11/04/2014 96  96 - 112 mEq/L Final  . CO2 11/04/2014 24  19 - 32 mEq/L Final  . Glucose, Bld 11/04/2014 142* 70 - 99 mg/dL Final  . BUN 11/04/2014 21   6 - 23 mg/dL Final  . Creatinine, Ser 11/04/2014 1.14* 0.50 - 1.10 mg/dL Final  . Calcium 11/04/2014 11.1* 8.4 - 10.5 mg/dL Final  . Total Protein 11/04/2014 8.5* 6.0 - 8.3 g/dL Final  . Albumin 11/04/2014 3.3* 3.5 - 5.2 g/dL Final  . AST 11/04/2014 17  0 - 37 U/L Final  . ALT 11/04/2014 10  0 - 35 U/L Final  . Alkaline Phosphatase 11/04/2014 93  39 - 117 U/L Final  . Total Bilirubin 11/04/2014 0.3  0.3 - 1.2 mg/dL Final  . GFR calc non Af Amer 11/04/2014 52* >90 mL/min Final  . GFR calc Af Amer 11/04/2014 60* >90 mL/min Final   Comment: (NOTE) The eGFR has been calculated using the CKD EPI equation. This calculation has not been validated in all clinical situations. eGFR's persistently <90 mL/min signify possible Chronic Kidney Disease.   . Anion gap 11/04/2014 16* 5 - 15 Final  . WBC 11/04/2014 7.0  4.0 - 10.5 K/uL Final  . RBC 11/04/2014 3.32* 3.87 - 5.11 MIL/uL Final  . Hemoglobin 11/04/2014 9.7* 12.0 - 15.0 g/dL Final  . HCT 11/04/2014 28.9* 36.0 - 46.0 % Final  . MCV 11/04/2014 87.0  78.0 - 100.0 fL Final  . MCH 11/04/2014 29.2  26.0 - 34.0 pg Final  . MCHC 11/04/2014 33.6  30.0 - 36.0 g/dL Final  . RDW 11/04/2014 18.5* 11.5 - 15.5 % Final  . Platelets 11/04/2014 372  150 - 400 K/uL Final  . Neutrophils Relative % 11/04/2014 66  43 - 77 % Final  . Neutro Abs 11/04/2014 4.6  1.7 - 7.7 K/uL Final  . Lymphocytes Relative 11/04/2014 18  12 - 46 % Final  . Lymphs Abs 11/04/2014 1.3  0.7 - 4.0 K/uL Final  . Monocytes Relative 11/04/2014 15* 3 - 12 % Final  . Monocytes Absolute 11/04/2014 1.1* 0.1 - 1.0 K/uL Final  . Eosinophils Relative 11/04/2014 1  0 - 5 % Final  . Eosinophils Absolute 11/04/2014 0.1  0.0 - 0.7 K/uL Final  . Basophils Relative 11/04/2014 0  0 - 1 % Final  . Basophils Absolute 11/04/2014 0.0  0.0 - 0.1 K/uL Final  . LDH 11/04/2014 146  94 - 250 U/L Final  Admission on 10/21/2014, Discharged on 10/21/2014  Component Date Value Ref Range Status  . WBC  10/21/2014 3.3* 4.0 - 10.5 K/uL Final  . RBC 10/21/2014 4.09  3.87 - 5.11 MIL/uL Final  . Hemoglobin 10/21/2014 11.8* 12.0 - 15.0 g/dL Final  . HCT 10/21/2014 35.0* 36.0 - 46.0 % Final  . MCV 10/21/2014 85.6  78.0 - 100.0 fL Final  . MCH 10/21/2014 28.9  26.0 - 34.0 pg Final  . MCHC 10/21/2014 33.7  30.0 - 36.0 g/dL Final  . RDW 10/21/2014 16.6* 11.5 - 15.5 % Final  . Platelets 10/21/2014 224  150 - 400 K/uL Final  . Neutrophils Relative % 10/21/2014 72  43 - 77 % Final  . Neutro Abs 10/21/2014 2.4  1.7 - 7.7 K/uL Final  . Lymphocytes Relative 10/21/2014 13  12 - 46 % Final  . Lymphs Abs 10/21/2014 0.4* 0.7 - 4.0 K/uL Final  . Monocytes Relative 10/21/2014 13* 3 - 12 % Final  . Monocytes Absolute 10/21/2014 0.4  0.1 - 1.0 K/uL Final  . Eosinophils Relative 10/21/2014 1  0 - 5 % Final  . Eosinophils Absolute 10/21/2014 0.0  0.0 - 0.7 K/uL Final  . Basophils Relative 10/21/2014 1  0 - 1 % Final  . Basophils Absolute 10/21/2014 0.0  0.0 - 0.1 K/uL Final  . Streptococcus, Group A Screen (Dir* 10/21/2014 NEGATIVE  NEGATIVE Final   Comment: (NOTE) A Rapid Antigen test may result negative if the antigen level in the sample is below the detection level of this test. The FDA has not cleared this test as a stand-alone test therefore the rapid antigen negative result has reflexed to a Group A Strep culture.   . Sodium 10/21/2014 137  137 - 147 mEq/L Final  . Potassium 10/21/2014 5.0  3.7 - 5.3 mEq/L Final  . Chloride 10/21/2014 95* 96 - 112 mEq/L Final  . CO2 10/21/2014 28  19 - 32 mEq/L Final  . Glucose, Bld 10/21/2014 107* 70 - 99 mg/dL Final  . BUN 10/21/2014 26* 6 - 23 mg/dL Final  . Creatinine, Ser 10/21/2014 1.08  0.50 - 1.10 mg/dL Final  . Calcium 10/21/2014 11.3* 8.4 - 10.5 mg/dL Final  . Total Protein 10/21/2014 8.7* 6.0 - 8.3 g/dL Final  . Albumin 10/21/2014 3.4* 3.5 - 5.2 g/dL Final  . AST 10/21/2014 17  0 - 37 U/L Final  . ALT 10/21/2014 13  0 - 35 U/L Final  . Alkaline  Phosphatase 10/21/2014 108  39 - 117 U/L Final  . Total Bilirubin 10/21/2014 0.5  0.3 - 1.2 mg/dL Final  . GFR calc non Af Amer 10/21/2014 55* >90 mL/min Final  . GFR calc Af Amer 10/21/2014 64* >90 mL/min Final   Comment: (NOTE) The eGFR has been  calculated using the CKD EPI equation. This calculation has not been validated in all clinical situations. eGFR's persistently <90 mL/min signify possible Chronic Kidney Disease.   . Anion gap 10/21/2014 14  5 - 15 Final  . Specimen Description 10/21/2014 THROAT   Final  . Special Requests 10/21/2014 NONE   Final  . Culture 10/21/2014    Final                   Value:GROUP A STREP (S.PYOGENES) ISOLATED Performed at [facility]   . Report Status 10/21/2014 10/22/2014 FINAL   Final  Infusion on 10/14/2014  Component Date Value Ref Range Status  . WBC 10/14/2014 13.5* 4.0 - 10.5 K/uL Final  . RBC 10/14/2014 3.36* 3.87 - 5.11 MIL/uL Final  . Hemoglobin 10/14/2014 9.8* 12.0 - 15.0 g/dL Final  . HCT 10/14/2014 28.7* 36.0 - 46.0 % Final  . MCV 10/14/2014 85.4  78.0 - 100.0 fL Final  . MCH 10/14/2014 29.2  26.0 - 34.0 pg Final  . MCHC 10/14/2014 34.1  30.0 - 36.0 g/dL Final  . RDW 10/14/2014 16.7* 11.5 - 15.5 % Final  . Platelets 10/14/2014 599* 150 - 400 K/uL Final  . Neutrophils Relative % 10/14/2014 86* 43 - 77 % Final  . Neutro Abs 10/14/2014 11.5* 1.7 - 7.7 K/uL Final  . Lymphocytes Relative 10/14/2014 7* 12 - 46 % Final  . Lymphs Abs 10/14/2014 1.0  0.7 - 4.0 K/uL Final  . Monocytes Relative 10/14/2014 7  3 - 12 % Final  . Monocytes Absolute 10/14/2014 1.0  0.1 - 1.0 K/uL Final  . Eosinophils Relative 10/14/2014 0  0 - 5 % Final  . Eosinophils Absolute 10/14/2014 0.0  0.0 - 0.7 K/uL Final  . Basophils Relative 10/14/2014 0  0 - 1 % Final  . Basophils Absolute 10/14/2014 0.0  0.0 - 0.1 K/uL Final  . Sodium 10/14/2014 128* 137 - 147 mEq/L Final  . Potassium 10/14/2014 4.5  3.7 - 5.3 mEq/L Final  . Chloride 10/14/2014 90* 96 -  112 mEq/L Final  . CO2 10/14/2014 23  19 - 32 mEq/L Final  . Glucose, Bld 10/14/2014 387* 70 - 99 mg/dL Final  . BUN 10/14/2014 17  6 - 23 mg/dL Final  . Creatinine, Ser 10/14/2014 0.81  0.50 - 1.10 mg/dL Final  . Calcium 10/14/2014 10.4  8.4 - 10.5 mg/dL Final  . Total Protein 10/14/2014 8.1  6.0 - 8.3 g/dL Final  . Albumin 10/14/2014 3.1* 3.5 - 5.2 g/dL Final  . AST 10/14/2014 18  0 - 37 U/L Final  . ALT 10/14/2014 27  0 - 35 U/L Final  . Alkaline Phosphatase 10/14/2014 144* 39 - 117 U/L Final  . Total Bilirubin 10/14/2014 <0.2* 0.3 - 1.2 mg/dL Final  . GFR calc non Af Amer 10/14/2014 79* >90 mL/min Final  . GFR calc Af Amer 10/14/2014 >90  >90 mL/min Final   Comment: (NOTE) The eGFR has been calculated using the CKD EPI equation. This calculation has not been validated in all clinical situations. eGFR's persistently <90 mL/min signify possible Chronic Kidney Disease.   . Anion gap 10/14/2014 15  5 - 15 Final  . Specific Gravity, Urine 10/14/2014 1.010  1.005 - 1.030 Final  . pH 10/14/2014 6.5  5.0 - 8.0 Final  . Glucose, UA 10/14/2014 500* NEGATIVE mg/dL Final  . Hgb urine dipstick 10/14/2014 NEGATIVE  NEGATIVE Final  . Bilirubin Urine 10/14/2014 NEGATIVE  NEGATIVE Final  . Ketones,  ur 10/14/2014 NEGATIVE  NEGATIVE mg/dL Final  . Protein, ur 10/14/2014 NEGATIVE  NEGATIVE mg/dL Final  . Urobilinogen, UA 10/14/2014 0.2  0.0 - 1.0 mg/dL Final  . Nitrite 10/14/2014 NEGATIVE  NEGATIVE Final  . Leukocytes, UA 10/14/2014 NEGATIVE  NEGATIVE Final    PATHOLOGY: Adenocarcinoma with no driver mutations.  Urinalysis    Component Value Date/Time   LABSPEC 1.020 11/04/2014 0900   PHURINE 6.0 11/04/2014 0900   GLUCOSEU NEGATIVE 11/04/2014 0900   HGBUR NEGATIVE 11/04/2014 0900   BILIRUBINUR NEGATIVE 11/04/2014 0900   KETONESUR TRACE* 11/04/2014 0900   PROTEINUR 30* 11/04/2014 0900   UROBILINOGEN 0.2 11/04/2014 0900   NITRITE NEGATIVE 11/04/2014 0900   LEUKOCYTESUR NEGATIVE  11/04/2014 0900    RADIOGRAPHIC STUDIES: Dg Chest 2 View  10/21/2014   CLINICAL DATA:  Lung carcinoma with throat swelling and cough for 3 days.  EXAM: CHEST  2 VIEW  COMPARISON:  Chest CT September 29, 2014 and July 28, 2014  FINDINGS: There is consolidation in the right upper lobe with apparent mass in this area, stable. There is mild scarring in the right upper lobe. Elsewhere lungs appear clear. Heart size and pulmonary vascularity are normal. No adenopathy. Port-A-Cath tip is in the superior vena cava. No pneumothorax. No bone lesions.  IMPRESSION: Persistent right upper lobe mass with volume loss. Mild scarring right upper lobe. No new opacity. No pneumothorax.   Electronically Signed   By: Lowella Grip M.D.   On: 10/21/2014 10:32   Ct Soft Tissue Neck W Contrast  10/21/2014   CLINICAL DATA:  Patient with known lung cancer presenting with dysphagia and sore throat  EXAM: CT NECK WITH CONTRAST  TECHNIQUE: Multidetector CT imaging of the neck was performed using the standard protocol following the bolus administration of intravenous contrast.  CONTRAST:  67m OMNIPAQUE IOHEXOL 300 MG/ML  SOLN  COMPARISON:  Chest CT September 29, 2014 for radiation therapy planning  FINDINGS: The epiglottis and aryepiglottic folds appear normal. There is no appreciable narrowing of the cervical and thoracic tracheal air columns. The tongue base region appears normal. There is no appreciable pharyngeal airway compromise.  There is consolidation with mass in the right upper lobe extending into the apex. There is underlying bullous disease in the visualized lung parenchyma. In the visualized portions of the mediastinum, there is no appreciable adenopathy.  There are multiple subcentimeter lymph nodes in the neck. There is a borderline prominent lymph node at the cervical -thoracic junction on the left medially, lateral to the thyroid measuring 1.2 x 0.9 cm. There is a prominent lymph node at the level of the angle of  the mandible on the left measuring 1.3 x 0.9 cm. There is a similar appearing lymph node just deep to the sternocleidomastoid muscle on the right at the level of the angle of the mandible measuring 1.3 x 0.9 cm. No larger lymph nodes are identified.  There is a nodular lesion in the right lobe of the thyroid inferiorly measuring 6 x 4 mm. There is a 2 mm nodular lesion in the left lobe of the thyroid. No larger thyroid lesions are identified. The larynx appears normal. No salivary gland lesions are appreciable. Tonsils and adenoidal structures appear symmetric and within normal limits bilaterally.  The prevertebral soft tissues are normal. There is no evidence suggesting abscess on this study.  There is a lytic lesion in the right side of the T2 vertebral body with sclerosis in the left side of the T2 vertebral body.  There is extensive lytic change in the right second rib as well as in the right T2 transverse process. The other visualized bony structures appear normal.  Visualized paranasal sinuses and mastoids are clear. Note that there is some degenerative type change in the cervical spine.  IMPRESSION: There is no evidence of inflammation in the region of the pharynx by CT. The epiglottis and aryepiglottic folds appear normal. Prevertebral soft tissues are normal as is the tongue and tongue base regions. No pharyngeal, cervical, or thoracic tracheal air column compromise or narrowing seen.  Mass with volume loss right upper lobe extending to apex. Several borderline prominent lymph nodes as outlined above.  There is a metastatic focus involving the T2 vertebral body. There is lytic change toward the right and sclerosis in the left side of this vertebral body. There is at extensive lytic change in the right transverse process at T2 as well as throughout the right second rib.  Subcentimeter thyroid nodular lesions are indicative of multinodular goiter. There is no dominant thyroid mass.   Electronically Signed    By: Lowella Grip M.D.   On: 10/21/2014 11:02    ASSESSMENT:  1. Stage IV adenocarcinoma lung presenting as a Pancoast tumor with right brachial plexus neuralgia with metastases to mediastinum, chest wall, and retroperitoneal lymph nodes plus bilateral adrenal metastases, good tolerance of initial chemotherapy.. Completed radiotherapy on 10/27/2014 with excellent control of brachial plexopathy. 2. Chronic obstructive pulmonary disease.  3. Gastroesophageal reflux disease. 4. Right brachio-plexopathy, vastly improved, requiring only 20 mg of oxycodone daily without fentanyl patch. 5. Glucose intolerance with blood sugar of 387, now 142 on metformin 500 mg twice a day   PLAN:  #1. Cisplatin/pemetrexed/Avastin cycle #5 today. To complete 1 additional cycle in 3 weeks after which repeat CT scans of the chest abdomen and pelvis will be done. Maintenance therapy will then be instituted utilizing pemetrexed and Avastin every 3 weeks. #2. Follow-up in 3 weeks for cycle #6 of chemotherapy. Patient reminded to continue folic acid daily. She is no longer using a fentanyl patch for pain but a prescription was given anyway in case pain worsened.    All questions were answered. The patient knows to call the clinic with any problems, questions or concerns. We can certainly see the patient much sooner if necessary.   I spent 30 minutes counseling the patient face to face. The total time spent in the appointment was 40 minutes.    Doroteo Bradford, MD 11/04/2014 10:56 AM  DISCLAIMER:  This note was dictated with voice recognition software.  Similar sounding words can inadvertently be transcribed inaccurately and may not be corrected upon review.

## 2014-11-04 NOTE — Progress Notes (Signed)
Current therapy should read cisplatin/pemetrexed/Avastin instead of cisplatin/methotrexate/Avastin.

## 2014-11-04 NOTE — Patient Instructions (Signed)
[facility] Discharge Instructions for Patients Receiving Chemotherapy  Today you received the following chemotherapy agents Pemetrexed, Cisplatin and Bevacizamab. You also received a B-12 injection.  Please follow up as scheduled in 3 weeks.  Please call for any questions or concerns.      If you develop nausea and vomiting, or diarrhea that is not controlled by your medication, call the clinic.  The clinic phone number is [phone]. Office hours are Monday-Friday 8:30am-5:00pm.  BELOW ARE SYMPTOMS THAT SHOULD BE REPORTED IMMEDIATELY:  *FEVER GREATER THAN 101.0 F  *CHILLS WITH OR WITHOUT FEVER  NAUSEA AND VOMITING THAT IS NOT CONTROLLED WITH YOUR NAUSEA MEDICATION  *UNUSUAL SHORTNESS OF BREATH  *UNUSUAL BRUISING OR BLEEDING  TENDERNESS IN MOUTH AND THROAT WITH OR WITHOUT PRESENCE OF ULCERS  *URINARY PROBLEMS  *BOWEL PROBLEMS  UNUSUAL RASH Items with * indicate a potential emergency and should be followed up as soon as possible. If you have an emergency after office hours please contact your primary care physician or go to the nearest emergency department.  Please call the clinic during office hours if you have any questions or concerns.   You may also contact the Patient Navigator at [phone] should you have any questions or need assistance in obtaining follow up care. _____________________________________________________________________ Have you asked about our STAR program?    STAR stands for Survivorship Training and Rehabilitation, and this is a nationally recognized cancer care program that focuses on survivorship and rehabilitation.  Cancer and cancer treatments may cause problems, such as, pain, making you feel tired and keeping you from doing the things that you need or want to do. Cancer rehabilitation can help. Our goal is to reduce these troubling effects and help you have the best quality of life possible.  You may receive a survey from  a nurse that asks questions about your current state of health.  Based on the survey results, all eligible patients will be referred to the [facility] program for an evaluation so we can better serve you! A frequently asked questions sheet is available upon request.

## 2014-11-04 NOTE — Progress Notes (Signed)
Patient tolerated treatment well.

## 2014-11-04 NOTE — Progress Notes (Signed)
[place] Clinical Social Work  Clinical Social Work was referred by [place] rounding for assessment of psychosocial needs due to past needs.  Clinical Social Worker met with patient and her family at [facility] to offer support and assess for needs.  Pt denies new concerns and sister has complete Child psychotherapist. They are still working on [place] application and will let CSW know when they need assistance. No new needs identified at this time. Pt and sister aware to reach out to CSW as needed.   Clinical Social Work interventions: Reassessment of needs    [name], [place] Tuesdays 8:30-1pm Wednesdays 8:30-12pm  [phone]

## 2014-11-05 ENCOUNTER — Other Ambulatory Visit (HOSPITAL_COMMUNITY): Payer: Self-pay | Admitting: Hematology and Oncology

## 2014-11-05 ENCOUNTER — Telehealth (HOSPITAL_COMMUNITY): Payer: Self-pay | Admitting: *Deleted

## 2014-11-05 MED ORDER — FLUTICASONE-SALMETEROL 500-50 MCG/DOSE IN AEPB: INHALATION_SPRAY | RESPIRATORY_TRACT | Status: DC

## 2014-11-24 NOTE — Progress Notes (Signed)
[name], stage IV, bilateral adrenal metastases, retroperitoneal lymph nodes  Biopsy on 08/01/2014  Right Brachial plexus neuralgia  EGFR mutation negative ALK mutation negative  Extensive soft tissue thickening in the posterior nasopharynx with bilateral level IIa lymphadenopathy in the neck. The possibility of a second primary neoplasm in this region warrants consideration. Clinical correlation is recommended.  QUIT SMOKING 2 weeks ago  CURRENT THERAPY: Cisplatin/Alimta/Avastin  INTERVAL HISTORY: CHRISHONDA HESCH 59 y.o. female returns for follow-up of stage IV pancoast tumor.  Rare headaches and she notices that it is difficult to initiate swallowing.  Rare blood tinged mucus from her nose. Pain in the shoulder area and R arm is gone. Radiation was done in [place].  Her weight is down 28 pounds since September.  MEDICAL HISTORY: Past Medical History  Diagnosis Date  . Hypertension   . Reflux   . Hyperlipidemia   . Lung mass 07/28/2014  . Adrenal mass, right 07/28/2014  . Diabetes mellitus without complication     has Adrenal mass, right; Cancer of upper lobe of right lung; and ARF (acute renal failure) on her problem list.      Cancer of upper lobe of right lung   07/28/2014 Imaging CT chest: Large R apical mass consistent with malignancy. This is destroying the R 2nd rib with extension into adjacent soft tissue. R hilar adenopathy with R 5cm adrenal metastatic lesion.   08/01/2014 Initial Biopsy Lung, needle/core biopsy(ies), right upper lobe - POORLY DIFFERENTIATED ADENOCARCINOMA, SEE COMMENT.   08/08/2014 PET scan Large hypermetabolic R apical mass with evidence of direct chest wall and mediastinal invasion, right retrocrural lymphadenopathy, extensive retroperitoneal lymphadenopathy, and metastatic lesions to the adrenal glands    09/02/2014 -  Chemotherapy Cisplatin/Pemetrexed/Avastin every 21 days   10/07/2014 - 10/27/2014 Radiation Therapy Right lung apex for  control of brachioplexopathy.     has No Known Allergies.  We administered (dextrose 5 % and 0.45% NaCl 1,000 mL with potassium chloride 20 mEq, magnesium sulfate 12 mEq, mannitol 12.5 g infusion) and alteplase.  SURGICAL HISTORY: Past Surgical History  Procedure Laterality Date  . Appendectomy    . Lung biopsy Right 07/2014    CT guided  . Portacath placement Left 09/01/2014    SOCIAL HISTORY: History   Social History  . Marital Status: Married    Spouse Name: N/A    Number of Children: N/A  . Years of Education: N/A   Occupational History  . Not on file.   Social History Main Topics  . Smoking status: Light Tobacco Smoker -- 0.30 packs/day    Types: Cigarettes  . Smokeless tobacco: Never Used  . Alcohol Use: No  . Drug Use: No  . Sexual Activity: Not on file   Other Topics Concern  . Not on file   Social History Narrative    FAMILY HISTORY: Family History  Problem Relation Age of Onset  . Cancer Sister     Review of Systems  Constitutional: Positive for malaise/fatigue.  HENT: Negative for congestion, ear discharge, ear pain, hearing loss, nosebleeds, sore throat and tinnitus.   Eyes: Negative.   Respiratory: Negative.  Negative for stridor.   Cardiovascular: Negative.   Gastrointestinal: Positive for nausea and constipation. Negative for heartburn, vomiting, abdominal pain, diarrhea, blood in stool and melena.  Musculoskeletal: Negative.   Skin: Negative.   Neurological: Positive for headaches. Negative for dizziness, tingling, tremors, sensory change, speech change, focal weakness, seizures and loss of consciousness.  Endo/Heme/Allergies: Negative.  Psychiatric/Behavioral: Negative.     PHYSICAL EXAMINATION  ECOG PERFORMANCE STATUS: 1 - Symptomatic but completely ambulatory  Filed Vitals:   11/25/14 0913  BP: 123/82  Pulse: 96  Temp: 97.7 F (36.5 C)  Resp: 18    Physical Exam  Constitutional: She is oriented to person, place, and time  and well-developed, well-nourished, and in no distress.  HENT:  Head: Normocephalic and atraumatic.  Nose: Nose normal.  Mouth/Throat: Oropharynx is clear and moist. No oropharyngeal exudate.  Eyes: Conjunctivae and EOM are normal. Pupils are equal, round, and reactive to light. Right eye exhibits no discharge. Left eye exhibits no discharge. No scleral icterus.  Neck: Normal range of motion. Neck supple. No tracheal deviation present. No thyromegaly present.  Cardiovascular: Normal rate, regular rhythm and normal heart sounds.  Exam reveals no gallop and no friction rub.   No murmur heard. Pulmonary/Chest: Effort normal and breath sounds normal. She has no wheezes. She has no rales.  Abdominal: Soft. Bowel sounds are normal. She exhibits no distension and no mass. There is no tenderness. There is no rebound and no guarding.  Musculoskeletal: Normal range of motion. She exhibits no edema.  Lymphadenopathy:    She has no cervical adenopathy.  Neurological: She is alert and oriented to person, place, and time. She has normal reflexes. No cranial nerve deficit. Gait normal. Coordination normal.  Skin: Skin is warm and dry. No rash noted.  Psychiatric: Mood, memory, affect and judgment normal.  Nursing note and vitals reviewed.   LABORATORY DATA:    Ref Range 9d ago    WBC 4.0 - 10.5 K/uL 9.0   RBC 3.87 - 5.11 MIL/uL 3.05 (L)   Hemoglobin 12.0 - 15.0 g/dL 8.8 (L)   HCT 36.0 - 46.0 % 26.3 (L)   MCV 78.0 - 100.0 fL 86.2   MCH 26.0 - 34.0 pg 28.9   MCHC 30.0 - 36.0 g/dL 33.5   RDW 11.5 - 15.5 % 19.5 (H)   Platelets 150 - 400 K/uL 412 (H)   Neutrophils Relative % 43 - 77 % 85 (H)   Neutro Abs 1.7 - 7.7 K/uL 7.6   Lymphocytes Relative 12 - 46 % 8 (L)   Lymphs Abs 0.7 - 4.0 K/uL 0.7   Monocytes Relative 3 - 12 % 7   Monocytes Absolute 0.1 - 1.0 K/uL 0.6   Eosinophils Relative 0 - 5 % 0   Eosinophils Absolute 0.0 - 0.7 K/uL 0.0   Basophils Relative 0 - 1 % 0     Basophils Absolute 0.0 - 0.1 K/uL 0.0   Resulting Agency [facility]    Specimen Collected: 11/25/14 10:07 AM Last Resulted: 11/25/14 10:37 AM    Sodium 135 - 145 mmol/L 135   Comments: Please note change in reference range.   Potassium 3.5 - 5.1 mmol/L 2.6 (LL)   Comments: CRITICAL RESULT CALLED TO, READ BACK BY AND VERIFIED WITH:  ANDERSON,A AT 10:50AM ON 11/25/14 BY FESTERMAN,C  Please note change in reference range.     Chloride 96 - 112 mEq/L 99   CO2 19 - 32 mmol/L 21   Glucose, Bld 70 - 99 mg/dL 143 (H)   BUN 6 - 23 mg/dL 48 (H)   Creatinine, Ser 0.50 - 1.10 mg/dL 3.88 (H)   Calcium 8.4 - 10.5 mg/dL 9.6   Total Protein 6.0 - 8.3 g/dL 8.0   Albumin 3.5 - 5.2 g/dL 3.1 (L)   AST 0 - 37 U/L 20   ALT  0 - 35 U/L 14   Alkaline Phosphatase 39 - 117 U/L 95   Total Bilirubin 0.3 - 1.2 mg/dL 0.3   GFR calc non Af Amer >90 mL/min 12 (L)   GFR calc Af Amer >90 mL/min 14 (L)   Comments: (NOTE)  The eGFR has been calculated using the CKD EPI equation.  This calculation has not been validated in all clinical situations.  eGFR's persistently <90 mL/min signify possible Chronic Kidney  Disease.     Anion gap 5 - 15  15    RADIOGRAPHIC STUDIES:   CLINICAL DATA: Initial treatment strategy for lung mass and right adrenal mass.  EXAM: NUCLEAR MEDICINE PET SKULL BASE TO THIGH    IMPRESSION: 1. Large hypermetabolic right apical mass with evidence of direct chest wall and mediastinal invasion, right retrocrural lymphadenopathy, extensive retroperitoneal lymphadenopathy, and metastatic lesions to the adrenal glands bilaterally, including a 6.6 x 4.2 cm hypermetabolic right adrenal mass. Given the recent biopsy diagnosis of poorly differentiated adenocarcinoma from the right apical mass, findings are favored to reflect stage IV lung cancer. 2. However, there is also extensive soft tissue thickening in the posterior nasopharynx with bilateral level IIa  lymphadenopathy in the neck. The possibility of a second primary neoplasm in this region warrants consideration. Clinical correlation is recommended. 3. There is also a small focus of hypermetabolism in the angle of the mandible on the left side, which is favored to be related to underlying dental disease, although a metastatic lesion in this region is difficult to exclude. 4. Additional findings, as above.   Electronically Signed  By: Vinnie Langton M.D.  On: 08/08/2014 11:12    ASSESSMENT and THERAPY PLAN:    Cancer of upper lobe of right lung Pleasant 59 year old female with a diagnosis of stage IV adenocarcinoma EGFR and out mutation negative. She had a right Pancoast tumor with right brachial plexus neuralgia. Her pain is markedly improved after radiation. She is currently on Cisplatin and Alimta and Avastin.she has not had imaging in some time her performance status is declining and her weight is down 28 pounds since September. Initial imaging studies showed extensive soft tissue thickening in the posterior nasopharynx and bilateral level IIA lymphadenopathy in the neck she is complaining of more difficulty swallowing and also noting more blood-tinged mucus from her nose.   At this point I recommended reimaging/restaging, I have significant concerns about a possible second primary and/or progression of her underlying disease. I discussed this with her in detail I will see her back post PET.   ARF (acute renal failure) Her creatinine is markedly elevated today and I have recommended holding therapy. She has had significant decrease in her oral intake, therefore we will hydrate her in the clinic today. She will return tomorrow for a repeat creatinine and if improved additional hydration. Should her kidney function worsen over the next 24-48 hours we will proceed with additional evaluation.     All questions were answered. The patient knows to call the clinic with any  problems, questions or concerns. We can certainly see the patient much sooner if necessary.  Molli Hazard 12/04/2014

## 2014-11-25 ENCOUNTER — Encounter (HOSPITAL_BASED_OUTPATIENT_CLINIC_OR_DEPARTMENT_OTHER): Payer: Medicaid Other | Admitting: Hematology & Oncology

## 2014-11-25 ENCOUNTER — Encounter: Payer: Self-pay | Admitting: *Deleted

## 2014-11-25 ENCOUNTER — Encounter (HOSPITAL_BASED_OUTPATIENT_CLINIC_OR_DEPARTMENT_OTHER): Payer: Medicaid Other

## 2014-11-25 ENCOUNTER — Encounter (HOSPITAL_COMMUNITY): Payer: Medicaid Other | Attending: Hematology & Oncology

## 2014-11-25 ENCOUNTER — Encounter (HOSPITAL_COMMUNITY): Payer: Self-pay | Admitting: Hematology & Oncology

## 2014-11-25 VITALS — BP 123/82 | HR 96 | Temp 97.7°F | Resp 18 | Wt 77.4 lb

## 2014-11-25 DIAGNOSIS — C3411 Malignant neoplasm of upper lobe, right bronchus or lung: Secondary | ICD-10-CM

## 2014-11-25 DIAGNOSIS — C7972 Secondary malignant neoplasm of left adrenal gland: Secondary | ICD-10-CM | POA: Diagnosis not present

## 2014-11-25 DIAGNOSIS — Z452 Encounter for adjustment and management of vascular access device: Secondary | ICD-10-CM

## 2014-11-25 DIAGNOSIS — C7971 Secondary malignant neoplasm of right adrenal gland: Secondary | ICD-10-CM | POA: Insufficient documentation

## 2014-11-25 DIAGNOSIS — Z79899 Other long term (current) drug therapy: Secondary | ICD-10-CM | POA: Insufficient documentation

## 2014-11-25 DIAGNOSIS — R634 Abnormal weight loss: Secondary | ICD-10-CM | POA: Diagnosis not present

## 2014-11-25 DIAGNOSIS — J449 Chronic obstructive pulmonary disease, unspecified: Secondary | ICD-10-CM | POA: Insufficient documentation

## 2014-11-25 DIAGNOSIS — C781 Secondary malignant neoplasm of mediastinum: Secondary | ICD-10-CM | POA: Diagnosis not present

## 2014-11-25 DIAGNOSIS — N179 Acute kidney failure, unspecified: Secondary | ICD-10-CM

## 2014-11-25 DIAGNOSIS — I10 Essential (primary) hypertension: Secondary | ICD-10-CM | POA: Diagnosis not present

## 2014-11-25 DIAGNOSIS — R1310 Dysphagia, unspecified: Secondary | ICD-10-CM | POA: Diagnosis not present

## 2014-11-25 DIAGNOSIS — C7989 Secondary malignant neoplasm of other specified sites: Secondary | ICD-10-CM | POA: Diagnosis not present

## 2014-11-25 DIAGNOSIS — E785 Hyperlipidemia, unspecified: Secondary | ICD-10-CM | POA: Diagnosis not present

## 2014-11-25 DIAGNOSIS — K219 Gastro-esophageal reflux disease without esophagitis: Secondary | ICD-10-CM | POA: Diagnosis not present

## 2014-11-25 DIAGNOSIS — E876 Hypokalemia: Secondary | ICD-10-CM

## 2014-11-25 DIAGNOSIS — E278 Other specified disorders of adrenal gland: Secondary | ICD-10-CM

## 2014-11-25 DIAGNOSIS — F17210 Nicotine dependence, cigarettes, uncomplicated: Secondary | ICD-10-CM | POA: Diagnosis not present

## 2014-11-25 DIAGNOSIS — Z9089 Acquired absence of other organs: Secondary | ICD-10-CM | POA: Diagnosis not present

## 2014-11-25 DIAGNOSIS — C772 Secondary and unspecified malignant neoplasm of intra-abdominal lymph nodes: Secondary | ICD-10-CM | POA: Insufficient documentation

## 2014-11-25 DIAGNOSIS — C7970 Secondary malignant neoplasm of unspecified adrenal gland: Secondary | ICD-10-CM

## 2014-11-25 LAB — COMPREHENSIVE METABOLIC PANEL
ALBUMIN: 3.1 g/dL — AB (ref 3.5–5.2)
ALT: 14 U/L (ref 0–35)
AST: 20 U/L (ref 0–37)
Alkaline Phosphatase: 95 U/L (ref 39–117)
Anion gap: 15 (ref 5–15)
BUN: 48 mg/dL — ABNORMAL HIGH (ref 6–23)
CALCIUM: 9.6 mg/dL (ref 8.4–10.5)
CO2: 21 mmol/L (ref 19–32)
CREATININE: 3.88 mg/dL — AB (ref 0.50–1.10)
Chloride: 99 mEq/L (ref 96–112)
GFR calc Af Amer: 14 mL/min — ABNORMAL LOW (ref >90)
GFR, EST NON AFRICAN AMERICAN: 12 mL/min — AB (ref >90)
Glucose, Bld: 143 mg/dL — ABNORMAL HIGH (ref 70–99)
POTASSIUM: 2.6 mmol/L — AB (ref 3.5–5.1)
Sodium: 135 mmol/L (ref 135–145)
Total Bilirubin: 0.3 mg/dL (ref 0.3–1.2)
Total Protein: 8 g/dL (ref 6.0–8.3)

## 2014-11-25 LAB — CBC WITH DIFFERENTIAL/PLATELET
BASOS ABS: 0 10*3/uL (ref 0.0–0.1)
Basophils Relative: 0 % (ref 0–1)
EOS ABS: 0 10*3/uL (ref 0.0–0.7)
Eosinophils Relative: 0 % (ref 0–5)
HEMATOCRIT: 26.3 % — AB (ref 36.0–46.0)
HEMOGLOBIN: 8.8 g/dL — AB (ref 12.0–15.0)
Lymphocytes Relative: 8 % — ABNORMAL LOW (ref 12–46)
Lymphs Abs: 0.7 10*3/uL (ref 0.7–4.0)
MCH: 28.9 pg (ref 26.0–34.0)
MCHC: 33.5 g/dL (ref 30.0–36.0)
MCV: 86.2 fL (ref 78.0–100.0)
MONOS PCT: 7 % (ref 3–12)
Monocytes Absolute: 0.6 10*3/uL (ref 0.1–1.0)
NEUTROS PCT: 85 % — AB (ref 43–77)
Neutro Abs: 7.6 10*3/uL (ref 1.7–7.7)
Platelets: 412 10*3/uL — ABNORMAL HIGH (ref 150–400)
RBC: 3.05 MIL/uL — AB (ref 3.87–5.11)
RDW: 19.5 % — ABNORMAL HIGH (ref 11.5–15.5)
WBC: 9 10*3/uL (ref 4.0–10.5)

## 2014-11-25 LAB — URINALYSIS, DIPSTICK ONLY
BILIRUBIN URINE: NEGATIVE
Glucose, UA: NEGATIVE mg/dL
Ketones, ur: NEGATIVE mg/dL
LEUKOCYTES UA: NEGATIVE
NITRITE: NEGATIVE
PH: 5.5 (ref 5.0–8.0)
Protein, ur: 30 mg/dL — AB
Specific Gravity, Urine: 1.02 (ref 1.005–1.030)
UROBILINOGEN UA: 0.2 mg/dL (ref 0.0–1.0)

## 2014-11-25 MED ORDER — POTASSIUM CHLORIDE 20 MEQ/15ML (10%) PO SOLN
40.0000 meq | Freq: Once | ORAL | Status: AC
  Administered 2014-11-25: 40 meq via ORAL
  Filled 2014-11-25: qty 30

## 2014-11-25 MED ORDER — SODIUM CHLORIDE 0.9 % IJ SOLN: 10.0000 mL | INTRAMUSCULAR | Status: DC | PRN

## 2014-11-25 MED ORDER — POTASSIUM CHLORIDE 2 MEQ/ML IV SOLN
Freq: Once | INTRAVENOUS | Status: AC
  Administered 2014-11-25: 11:00:00 via INTRAVENOUS
  Filled 2014-11-25: qty 10

## 2014-11-25 MED ORDER — ALTEPLASE 2 MG IJ SOLR
2.0000 mg | Freq: Once | INTRAMUSCULAR | Status: AC | PRN
  Administered 2014-11-25: 2 mg
  Filled 2014-11-25: qty 2

## 2014-11-25 MED ORDER — POTASSIUM CHLORIDE 20 MEQ/15ML (10%) PO SOLN: 40.0000 meq | Freq: Two times a day (BID) | ORAL | Status: DC

## 2014-11-25 MED ORDER — STERILE WATER FOR INJECTION IJ SOLN
INTRAMUSCULAR | Status: AC
  Filled 2014-11-25: qty 10

## 2014-11-25 MED ORDER — POTASSIUM CHLORIDE CRYS ER 20 MEQ PO TBCR
40.0000 meq | EXTENDED_RELEASE_TABLET | Freq: Once | ORAL | Status: DC
  Filled 2014-11-25: qty 2

## 2014-11-25 MED ORDER — SODIUM CHLORIDE 0.9 % IV SOLN
Freq: Once | INTRAVENOUS | Status: DC
  Filled 2014-11-25: qty 5

## 2014-11-25 MED ORDER — CYANOCOBALAMIN 1000 MCG/ML IJ SOLN: 1000.0000 ug | Freq: Once | INTRAMUSCULAR | Status: DC

## 2014-11-25 MED ORDER — SODIUM CHLORIDE 0.9 % IV SOLN: 150.0000 mg | Freq: Once | INTRAVENOUS | Status: DC

## 2014-11-25 MED ORDER — HEPARIN SOD (PORK) LOCK FLUSH 100 UNIT/ML IV SOLN
500.0000 [IU] | Freq: Once | INTRAVENOUS | Status: AC | PRN
  Administered 2014-11-25: 500 [IU]
  Filled 2014-11-25: qty 5

## 2014-11-25 MED ORDER — PALONOSETRON HCL INJECTION 0.25 MG/5ML: 0.2500 mg | Freq: Once | INTRAVENOUS | Status: DC

## 2014-11-25 MED ORDER — DEXAMETHASONE SODIUM PHOSPHATE 20 MG/5ML IJ SOLN: 12.0000 mg | Freq: Once | INTRAMUSCULAR | Status: DC

## 2014-11-25 NOTE — Progress Notes (Signed)
CRITICAL VALUE ALERT  Critical value received:  Potassium 2.6  Date of notification:  11/25/14   Time of notification:  7353   Critical value read back:Yes.    Nurse who received alert:  A. Ouida Sills, RN  MD notified:  S. Penland, MD  Time of notification:  57

## 2014-11-25 NOTE — Progress Notes (Signed)
No blood return from port today. Alteplase 2mg  instilled at 1040. No blood return obtained at 1140, 1240 or 1340.  1405, alteplase removed (1 cc) with a slight pink tint noted. Fluids restarted via port due to small infiltration of IV site. Patient tolerating well. Is aware of the importance of forcing fluids tonight and returning tomorrow for labs and possible fluids/and/or admission.

## 2014-11-25 NOTE — Progress Notes (Signed)
[facility] Clinical Social Work  Clinical Social Work was referred by Futures trader for assessment of psychosocial needs due to concerns with ss disability and possible change in status.  Clinical Social Worker met with patient, her sister and mother at [facility] to offer support and assess for needs.  Pt teary today and appeared overwhelmed with the information that there may be more health concerns. Pt continues to have good support from her mother and sister. Her sister has been assisting with applying for various assistance programs and they have applied for medicaid, ss disability and Cancer Care as CSW had previously discussed. Sister returns with Cancer Care application today and CSW completed the required information. Sister to bring in proof of income and then CSW to fax in once received. CSW provided emotional support and once again reviewed resources to assist.   Clinical Social Work interventions: Resource education Emotional support   [name], [place] Tuesdays 8:30-1pm Wednesdays 8:30-12pm  [phone]

## 2014-11-25 NOTE — Progress Notes (Signed)
Excellent blood return noted at end of infusion

## 2014-11-25 NOTE — Progress Notes (Signed)
LABS FOR CMP,CBCD

## 2014-11-25 NOTE — Patient Instructions (Addendum)
[facility] Discharge Instructions  RECOMMENDATIONS MADE BY THE CONSULTANT AND ANY TEST RESULTS WILL BE SENT TO YOUR REFERRING PHYSICIAN.  I am going to order a PET scan and then I will see you back after the scan to review the results. Please work on trying to eat frequent small meals.  We are going to work on your nutrition and weight. If you have problems prior to follow-up please call.  Chemo will be held today because your kidney function is high. We will give you fluids today and recheck early tomorrow at 53 am. There is a possibility that you will need more fluids, and xray of the kidney and possibly be admitted to the hospital.  Thank you for choosing [facility] to provide your oncology and hematology care.  To afford each patient quality time with our providers, please arrive at least 15 minutes before your scheduled appointment time.  With your help, our goal is to use those 15 minutes to complete the necessary work-up to ensure our physicians have the information they need to help with your evaluation and healthcare recommendations.    Effective January 1st, 2014, we ask that you re-schedule your appointment with our physicians should you arrive 10 or more minutes late for your appointment.  We strive to give you quality time with our providers, and arriving late affects you and other patients whose appointments are after yours.    Again, thank you for choosing [facility].  Our hope is that these requests will decrease the amount of time that you wait before being seen by our physicians.       _____________________________________________________________  Should you have questions after your visit to [facility], please contact our office at [phone] between the hours of 8:30 a.m. and 5:00 p.m.  Voicemails left after 4:30 p.m. will not be returned until the following business day.  For prescription refill requests,  have your pharmacy contact our office with your prescription refill request.

## 2014-11-26 ENCOUNTER — Encounter (HOSPITAL_BASED_OUTPATIENT_CLINIC_OR_DEPARTMENT_OTHER): Payer: Medicaid Other

## 2014-11-26 ENCOUNTER — Other Ambulatory Visit (HOSPITAL_COMMUNITY): Payer: Self-pay | Admitting: *Deleted

## 2014-11-26 ENCOUNTER — Encounter: Payer: Self-pay | Admitting: *Deleted

## 2014-11-26 DIAGNOSIS — C3411 Malignant neoplasm of upper lobe, right bronchus or lung: Secondary | ICD-10-CM | POA: Diagnosis not present

## 2014-11-26 DIAGNOSIS — C772 Secondary and unspecified malignant neoplasm of intra-abdominal lymph nodes: Secondary | ICD-10-CM

## 2014-11-26 DIAGNOSIS — C7970 Secondary malignant neoplasm of unspecified adrenal gland: Secondary | ICD-10-CM

## 2014-11-26 LAB — BASIC METABOLIC PANEL
ANION GAP: 10 (ref 5–15)
BUN: 37 mg/dL — ABNORMAL HIGH (ref 6–23)
CO2: 22 mmol/L (ref 19–32)
Calcium: 10 mg/dL (ref 8.4–10.5)
Chloride: 102 mEq/L (ref 96–112)
Creatinine, Ser: 2.71 mg/dL — ABNORMAL HIGH (ref 0.50–1.10)
GFR calc Af Amer: 21 mL/min — ABNORMAL LOW (ref >90)
GFR calc non Af Amer: 18 mL/min — ABNORMAL LOW (ref >90)
Glucose, Bld: 84 mg/dL (ref 70–99)
POTASSIUM: 2.8 mmol/L — AB (ref 3.5–5.1)
SODIUM: 134 mmol/L — AB (ref 135–145)

## 2014-11-26 MED ORDER — HEPARIN SOD (PORK) LOCK FLUSH 100 UNIT/ML IV SOLN
500.0000 [IU] | Freq: Once | INTRAVENOUS | Status: AC
  Administered 2014-11-26: 500 [IU] via INTRAVENOUS

## 2014-11-26 MED ORDER — SODIUM CHLORIDE 0.9 % IV SOLN: INTRAVENOUS | Status: DC

## 2014-11-26 MED ORDER — HEPARIN SOD (PORK) LOCK FLUSH 100 UNIT/ML IV SOLN
INTRAVENOUS | Status: AC
  Filled 2014-11-26: qty 5

## 2014-11-26 MED ORDER — SODIUM CHLORIDE 0.9 % IJ SOLN
10.0000 mL | INTRAMUSCULAR | Status: DC | PRN
  Administered 2014-11-26: 10 mL via INTRAVENOUS
  Filled 2014-11-26: qty 10

## 2014-11-26 MED ORDER — POTASSIUM CHLORIDE IN NACL 20-0.9 MEQ/L-% IV SOLN
INTRAVENOUS | Status: AC
  Administered 2014-11-26 (×2): via INTRAVENOUS
  Filled 2014-11-26 (×2): qty 1000

## 2014-11-26 NOTE — Addendum Note (Signed)
Addended by: Gerhard Perches on: 11/26/2014 05:07 PM   Modules accepted: Orders

## 2014-11-26 NOTE — Patient Instructions (Signed)
[facility] Discharge Instructions  RECOMMENDATIONS MADE BY THE CONSULTANT AND ANY TEST RESULTS WILL BE SENT TO YOUR REFERRING PHYSICIAN.  Today you received fluids with potassium in them via your port a cath. Pick up your potassium supplement from [place] and take as prescribed. Return on Friday for recheck of your potassium level and kidney function.  Thank you for choosing [facility] to provide your oncology and hematology care.  To afford each patient quality time with our providers, please arrive at least 15 minutes before your scheduled appointment time.  With your help, our goal is to use those 15 minutes to complete the necessary work-up to ensure our physicians have the information they need to help with your evaluation and healthcare recommendations.    Effective January 1st, 2014, we ask that you re-schedule your appointment with our physicians should you arrive 10 or more minutes late for your appointment.  We strive to give you quality time with our providers, and arriving late affects you and other patients whose appointments are after yours.    Again, thank you for choosing [facility].  Our hope is that these requests will decrease the amount of time that you wait before being seen by our physicians.       _____________________________________________________________  Should you have questions after your visit to [facility], please contact our office at [phone] between the hours of 8:30 a.m. and 4:30 p.m.  Voicemails left after 4:30 p.m. will not be returned until the following business day.  For prescription refill requests, have your pharmacy contact our office with your prescription refill request.    _______________________________________________________________  We hope that we have given you very good care.  You may receive a patient satisfaction survey in the mail, please complete it and return  it as soon as possible.  We value your feedback!  _______________________________________________________________  Have you asked about our STAR program?  STAR stands for Survivorship Training and Rehabilitation, and this is a nationally recognized cancer care program that focuses on survivorship and rehabilitation.  Cancer and cancer treatments may cause problems, such as, pain, making you feel tired and keeping you from doing the things that you need or want to do. Cancer rehabilitation can help. Our goal is to reduce these troubling effects and help you have the best quality of life possible.  You may receive a survey from a nurse that asks questions about your current state of health.  Based on the survey results, all eligible patients will be referred to the [facility] program for an evaluation so we can better serve you!  A frequently asked questions sheet is available upon request.

## 2014-11-26 NOTE — Progress Notes (Signed)
[facility] Clinical Social Work  Clinical Social Work was referred by need for CSW follow up for assessment of psychosocial needs.  Clinical Social Worker met with patient at [facility] to offer support and assess for needs.  CSW assisted with completion of referral paperwork to [place] to assist with transportation concerns. CSW also provided emotional support and supportive listening to pt and daughter. Pt glad she will not be admitted today. CSW to continue to follow and assist as needed.   Clinical Social Work interventions: Higher education careers adviser Emotional support  [name], [place] Tuesdays 8:30-1pm Wednesdays 8:30-12pm  [phone]

## 2014-11-26 NOTE — Progress Notes (Signed)
LABS FOR BMP

## 2014-11-26 NOTE — Progress Notes (Signed)
1500:  Infused 2L of NS, each liter with 35mEq of KCl, per MD orders.  Pt tolerated well.  A&Ox4; VSS. Discharged ambulatory in c/o family for transport home

## 2014-11-27 ENCOUNTER — Other Ambulatory Visit: Payer: Self-pay | Admitting: Radiation Oncology

## 2014-11-28 ENCOUNTER — Encounter (HOSPITAL_BASED_OUTPATIENT_CLINIC_OR_DEPARTMENT_OTHER): Payer: Medicaid Other

## 2014-11-28 ENCOUNTER — Encounter (HOSPITAL_COMMUNITY): Payer: Self-pay

## 2014-11-28 VITALS — BP 117/73 | HR 73 | Resp 18

## 2014-11-28 VITALS — BP 156/91 | HR 69 | Temp 98.2°F | Resp 20

## 2014-11-28 DIAGNOSIS — C3411 Malignant neoplasm of upper lobe, right bronchus or lung: Secondary | ICD-10-CM

## 2014-11-28 DIAGNOSIS — R110 Nausea: Secondary | ICD-10-CM

## 2014-11-28 DIAGNOSIS — R059 Cough, unspecified: Secondary | ICD-10-CM

## 2014-11-28 DIAGNOSIS — R05 Cough: Secondary | ICD-10-CM

## 2014-11-28 LAB — CBC WITH DIFFERENTIAL/PLATELET
BASOS ABS: 0 10*3/uL (ref 0.0–0.1)
Basophils Relative: 0 % (ref 0–1)
EOS PCT: 1 % (ref 0–5)
Eosinophils Absolute: 0.1 10*3/uL (ref 0.0–0.7)
HEMATOCRIT: 26 % — AB (ref 36.0–46.0)
HEMOGLOBIN: 8.5 g/dL — AB (ref 12.0–15.0)
LYMPHS ABS: 1.9 10*3/uL (ref 0.7–4.0)
LYMPHS PCT: 18 % (ref 12–46)
MCH: 28.5 pg (ref 26.0–34.0)
MCHC: 32.7 g/dL (ref 30.0–36.0)
MCV: 87.2 fL (ref 78.0–100.0)
MONO ABS: 1.4 10*3/uL — AB (ref 0.1–1.0)
Monocytes Relative: 14 % — ABNORMAL HIGH (ref 3–12)
NEUTROS ABS: 6.8 10*3/uL (ref 1.7–7.7)
NEUTROS PCT: 67 % (ref 43–77)
Platelets: 264 10*3/uL (ref 150–400)
RBC: 2.98 MIL/uL — AB (ref 3.87–5.11)
RDW: 19.9 % — ABNORMAL HIGH (ref 11.5–15.5)
WBC: 10 10*3/uL (ref 4.0–10.5)

## 2014-11-28 LAB — BASIC METABOLIC PANEL
ANION GAP: 7 (ref 5–15)
BUN: 16 mg/dL (ref 6–23)
CHLORIDE: 106 meq/L (ref 96–112)
CO2: 24 mmol/L (ref 19–32)
Calcium: 9.9 mg/dL (ref 8.4–10.5)
Creatinine, Ser: 1.22 mg/dL — ABNORMAL HIGH (ref 0.50–1.10)
GFR calc non Af Amer: 48 mL/min — ABNORMAL LOW (ref >90)
GFR, EST AFRICAN AMERICAN: 55 mL/min — AB (ref >90)
Glucose, Bld: 101 mg/dL — ABNORMAL HIGH (ref 70–99)
POTASSIUM: 3.4 mmol/L — AB (ref 3.5–5.1)
SODIUM: 137 mmol/L (ref 135–145)

## 2014-11-28 MED ORDER — LORAZEPAM 2 MG/ML IJ SOLN
INTRAMUSCULAR | Status: AC
  Filled 2014-11-28: qty 1

## 2014-11-28 MED ORDER — HYDROCODONE-HOMATROPINE 5-1.5 MG/5ML PO SYRP
5.0000 mL | ORAL_SOLUTION | Freq: Once | ORAL | Status: AC
  Administered 2014-11-28: 5 mL via ORAL
  Filled 2014-11-28: qty 5

## 2014-11-28 MED ORDER — SODIUM CHLORIDE 0.9 % IV SOLN: INTRAVENOUS | Status: DC

## 2014-11-28 MED ORDER — HEPARIN SOD (PORK) LOCK FLUSH 100 UNIT/ML IV SOLN
INTRAVENOUS | Status: AC
  Filled 2014-11-28: qty 5

## 2014-11-28 MED ORDER — POTASSIUM CHLORIDE IN NACL 20-0.9 MEQ/L-% IV SOLN
INTRAVENOUS | Status: DC
  Administered 2014-11-28: 10:00:00 via INTRAVENOUS
  Filled 2014-11-28 (×8): qty 1000

## 2014-11-28 MED ORDER — SODIUM CHLORIDE 0.9 % IV SOLN: 8.0000 mg | Freq: Once | INTRAVENOUS | Status: DC

## 2014-11-28 MED ORDER — SODIUM CHLORIDE 0.9 % IV SOLN
Freq: Once | INTRAVENOUS | Status: AC
  Administered 2014-11-28: 8 mg via INTRAVENOUS
  Filled 2014-11-28: qty 4

## 2014-11-28 MED ORDER — LORAZEPAM 2 MG/ML IJ SOLN
0.5000 mg | Freq: Once | INTRAMUSCULAR | Status: AC
  Administered 2014-11-28: 0.5 mg via INTRAVENOUS

## 2014-11-28 MED ORDER — HEPARIN SOD (PORK) LOCK FLUSH 100 UNIT/ML IV SOLN
500.0000 [IU] | Freq: Once | INTRAVENOUS | Status: AC
  Administered 2014-11-28: 500 [IU] via INTRAVENOUS

## 2014-11-28 MED ORDER — HYDROCODONE-HOMATROPINE 5-1.5 MG/5ML PO SYRP: 5.0000 mL | ORAL_SOLUTION | Freq: Four times a day (QID) | ORAL | Status: DC | PRN

## 2014-11-28 MED ORDER — DEXAMETHASONE SODIUM PHOSPHATE 10 MG/ML IJ SOLN: 20.0000 mg | Freq: Once | INTRAMUSCULAR | Status: DC

## 2014-11-28 NOTE — Progress Notes (Signed)
Melissa Sullivan tolerated nausea medication and a liter of fluid today

## 2014-11-28 NOTE — Patient Instructions (Signed)
[facility] Discharge Instructions  RECOMMENDATIONS MADE BY THE CONSULTANT AND ANY TEST RESULTS WILL BE SENT TO YOUR REFERRING PHYSICIAN.  You received a liter of fluids today and some nausea medication through your IV.  Follow up as scheduled and please call the clinic if you have any questions or conerns  Thank you for choosing [place] at [facility] to  provide your oncology and hematology care. To afford each patient quality  time with our provider, please arrive at least 15 minutes before your  scheduled appointment time.  You need to re-schedule your appointment should you arrive 10 or more  minutes late. We strive to give you quality time with our providers, and  arriving late affects you and other patients whose appointments are after  yours. Also, if you no show three or more times for appointments you may  be dismissed from the clinic at the providers discretion.  Again, thank you for choosing [facility]. Our hope is that  these requests will decrease the amount of time that you wait before being seen by our physicians. _______________________________________________________________  Should you have questions after your visit to [facility], please contact our office at [phone] between the hours of 8:30 a.m. and 5:00 p.m.  Voicemails left after 4:30 p.m. will not be returned until the following business day.  For prescription refill requests, have your pharmacy contact our office with your prescription refill request.

## 2014-11-28 NOTE — Progress Notes (Signed)
LABS FOR BMP,CBCD

## 2014-12-01 ENCOUNTER — Other Ambulatory Visit (HOSPITAL_COMMUNITY): Payer: Self-pay | Admitting: Oncology

## 2014-12-01 ENCOUNTER — Telehealth (HOSPITAL_COMMUNITY): Payer: Self-pay | Admitting: Oncology

## 2014-12-01 DIAGNOSIS — E278 Other specified disorders of adrenal gland: Secondary | ICD-10-CM

## 2014-12-01 DIAGNOSIS — C3411 Malignant neoplasm of upper lobe, right bronchus or lung: Secondary | ICD-10-CM

## 2014-12-01 DIAGNOSIS — R634 Abnormal weight loss: Secondary | ICD-10-CM

## 2014-12-01 NOTE — Telephone Encounter (Signed)
Peer to peer review resulted in denial of PET scan.    Melissa Sullivan has past abnormalities of the neck on PET imaging in Sep 2015.  This was re-evaluate in Dec 2015 which was not too helpful.  Clinically, she is declining with 28 lb weight loss since September 2015.   Dr. Sarina Ill reports that their oncology department has reviewed PET scan request and denied it.  They have strict guidelines for the role of PET scan: new abnormal CT imaging, increased liver function or cancer marker, or to differentiate tumor from radiation fibrosis.  He recommends performed CT CAP with contrast first and pursuing PET scan if needed.   Therefore, we will cancel PET scan for today and get the patient scheduled for CT CAP with contrast this week.  Orders placed.  She will return as scheduled.   KEFALAS,THOMAS 12/01/2014

## 2014-12-02 ENCOUNTER — Ambulatory Visit (HOSPITAL_COMMUNITY)
Admission: RE | Admit: 2014-12-02 | Discharge: 2014-12-02 | Disposition: A | Discharge status: Home / Self Care | Payer: Medicaid Other | Source: Ambulatory Visit | Attending: Oncology | Admitting: Oncology

## 2014-12-02 ENCOUNTER — Ambulatory Visit (HOSPITAL_COMMUNITY): Admission: RE | Admit: 2014-12-02 | Payer: Medicaid Other | Source: Ambulatory Visit

## 2014-12-02 ENCOUNTER — Encounter (HOSPITAL_COMMUNITY): Payer: Self-pay

## 2014-12-02 ENCOUNTER — Telehealth: Payer: Self-pay | Admitting: Nutrition

## 2014-12-02 DIAGNOSIS — C3411 Malignant neoplasm of upper lobe, right bronchus or lung: Secondary | ICD-10-CM | POA: Diagnosis not present

## 2014-12-02 DIAGNOSIS — E279 Disorder of adrenal gland, unspecified: Secondary | ICD-10-CM | POA: Insufficient documentation

## 2014-12-02 DIAGNOSIS — Z923 Personal history of irradiation: Secondary | ICD-10-CM | POA: Insufficient documentation

## 2014-12-02 DIAGNOSIS — E278 Other specified disorders of adrenal gland: Secondary | ICD-10-CM

## 2014-12-02 DIAGNOSIS — Z9221 Personal history of antineoplastic chemotherapy: Secondary | ICD-10-CM | POA: Diagnosis not present

## 2014-12-02 DIAGNOSIS — R634 Abnormal weight loss: Secondary | ICD-10-CM

## 2014-12-02 HISTORY — DX: Type 2 diabetes mellitus without complications: E11.9

## 2014-12-02 MED ORDER — IOHEXOL 300 MG/ML  SOLN
100.0000 mL | Freq: Once | INTRAMUSCULAR | Status: AC | PRN
  Administered 2014-12-02: 100 mL via INTRAVENOUS

## 2014-12-02 NOTE — Telephone Encounter (Signed)
Received nutrition consult from social worker to contact patient secondary to severe weight loss. Attempted to call patient at home and leave a message however voice message mailbox was full and I was unable to leave a message for patient.   I will attempt to call patient back at later date.

## 2014-12-04 ENCOUNTER — Encounter (HOSPITAL_BASED_OUTPATIENT_CLINIC_OR_DEPARTMENT_OTHER): Payer: Medicaid Other | Admitting: Hematology & Oncology

## 2014-12-04 ENCOUNTER — Encounter (HOSPITAL_COMMUNITY): Payer: Self-pay | Admitting: Hematology & Oncology

## 2014-12-04 VITALS — BP 121/84 | HR 107 | Temp 98.9°F | Resp 20 | Wt 77.1 lb

## 2014-12-04 DIAGNOSIS — C3411 Malignant neoplasm of upper lobe, right bronchus or lung: Secondary | ICD-10-CM | POA: Diagnosis present

## 2014-12-04 DIAGNOSIS — C7972 Secondary malignant neoplasm of left adrenal gland: Secondary | ICD-10-CM | POA: Diagnosis not present

## 2014-12-04 DIAGNOSIS — N179 Acute kidney failure, unspecified: Secondary | ICD-10-CM

## 2014-12-04 DIAGNOSIS — R930 Abnormal findings on diagnostic imaging of skull and head, not elsewhere classified: Secondary | ICD-10-CM | POA: Diagnosis not present

## 2014-12-04 DIAGNOSIS — C7971 Secondary malignant neoplasm of right adrenal gland: Secondary | ICD-10-CM

## 2014-12-04 DIAGNOSIS — R9389 Abnormal findings on diagnostic imaging of other specified body structures: Secondary | ICD-10-CM

## 2014-12-04 LAB — CBC WITH DIFFERENTIAL/PLATELET
BASOS ABS: 0 10*3/uL (ref 0.0–0.1)
Basophils Relative: 0 % (ref 0–1)
Eosinophils Absolute: 0 10*3/uL (ref 0.0–0.7)
Eosinophils Relative: 0 % (ref 0–5)
HEMATOCRIT: 27.5 % — AB (ref 36.0–46.0)
Hemoglobin: 9.4 g/dL — ABNORMAL LOW (ref 12.0–15.0)
LYMPHS ABS: 1.1 10*3/uL (ref 0.7–4.0)
Lymphocytes Relative: 12 % (ref 12–46)
MCH: 30 pg (ref 26.0–34.0)
MCHC: 34.2 g/dL (ref 30.0–36.0)
MCV: 87.9 fL (ref 78.0–100.0)
MONO ABS: 1.3 10*3/uL — AB (ref 0.1–1.0)
MONOS PCT: 14 % — AB (ref 3–12)
Neutro Abs: 7 10*3/uL (ref 1.7–7.7)
Neutrophils Relative %: 74 % (ref 43–77)
PLATELETS: 323 10*3/uL (ref 150–400)
RBC: 3.13 MIL/uL — AB (ref 3.87–5.11)
RDW: 20.1 % — AB (ref 11.5–15.5)
WBC: 9.4 10*3/uL (ref 4.0–10.5)

## 2014-12-04 LAB — COMPREHENSIVE METABOLIC PANEL
ALT: 15 U/L (ref 0–35)
AST: 24 U/L (ref 0–37)
Albumin: 3.2 g/dL — ABNORMAL LOW (ref 3.5–5.2)
Alkaline Phosphatase: 82 U/L (ref 39–117)
Anion gap: 8 (ref 5–15)
BUN: 9 mg/dL (ref 6–23)
CO2: 29 mmol/L (ref 19–32)
Calcium: 9.7 mg/dL (ref 8.4–10.5)
Chloride: 99 mEq/L (ref 96–112)
Creatinine, Ser: 0.86 mg/dL (ref 0.50–1.10)
GFR calc Af Amer: 85 mL/min — ABNORMAL LOW (ref >90)
GFR, EST NON AFRICAN AMERICAN: 73 mL/min — AB (ref >90)
GLUCOSE: 97 mg/dL (ref 70–99)
POTASSIUM: 3.4 mmol/L — AB (ref 3.5–5.1)
SODIUM: 136 mmol/L (ref 135–145)
Total Bilirubin: 0.5 mg/dL (ref 0.3–1.2)
Total Protein: 7 g/dL (ref 6.0–8.3)

## 2014-12-04 NOTE — Assessment & Plan Note (Addendum)
Stage IV adenocarcinoma of the lung. Melissa Sullivan was treated with Cisplatin/Alimta/Avastin and XRT to the large RUL mass.  Imaging is improved. However, she continues to decline clinically.  Based upon her recent imaging and her complaints I suspect esophagitis, and would recommend an EGD to r/o the possibility of a superimposed infectious esophagitis. I have discussed this with the patient in detail.  She has seen Dr. [name] in the past and I will try to arrange for EGD asap with her.  Additional therapy will be on hold until she is better and able to eat. Based upon improved disease I would change therapy to maintenance Alimta/Avastin.

## 2014-12-04 NOTE — Assessment & Plan Note (Signed)
Melissa Sullivan had abnormal findings on staging PET in the nasopharynx. She is now complaining of headaches, facial pain. Insurance denied additional imaging of this area.  I have discussed her case with Dr. Benjamine Mola who kindly agreed to see her next Thursday for endoscopy.

## 2014-12-04 NOTE — Assessment & Plan Note (Signed)
Her creatinine is markedly elevated today and I have recommended holding therapy. She has had significant decrease in her oral intake, therefore we will hydrate her in the clinic today. She will return tomorrow for a repeat creatinine and if improved additional hydration. Should her kidney function worsen over the next 24-48 hours we will proceed with additional evaluation.

## 2014-12-04 NOTE — Assessment & Plan Note (Signed)
Creatinine is back to baseline today.  I continued to encourage adequate fluid intake.

## 2014-12-04 NOTE — Assessment & Plan Note (Signed)
Pleasant 59 year old female with a diagnosis of stage IV adenocarcinoma EGFR and out mutation negative. She had a right Pancoast tumor with right brachial plexus neuralgia. Her pain is markedly improved after radiation. She is currently on Cisplatin and Alimta and Avastin.she has not had imaging in some time her performance status is declining and her weight is down 28 pounds since September. Initial imaging studies showed extensive soft tissue thickening in the posterior nasopharynx and bilateral level IIA lymphadenopathy in the neck she is complaining of more difficulty swallowing and also noting more blood-tinged mucus from her nose.   At this point I recommended reimaging/restaging, I have significant concerns about a possible second primary and/or progression of her underlying disease. I discussed this with her in detail I will see her back post PET.

## 2014-12-04 NOTE — Progress Notes (Signed)
[name], stage IV, bilateral adrenal metastases, retroperitoneal lymph nodes  Biopsy on 08/01/2014  XRT completed on 10/27/2014  Right Brachial plexus neuralgia  EGFR mutation negative ALK mutation negative  Extensive soft tissue thickening in the posterior nasopharynx with bilateral level IIa lymphadenopathy in the neck. The possibility of a second primary neoplasm in this region warrants consideration. Clinical correlation is recommended.  QUIT SMOKING 3 weeks ago  CURRENT THERAPY: Cisplatin/Alimta/Avastin  INTERVAL HISTORY: Melissa Sullivan 59 y.o. female returns for follow-up of stage IV pancoast tumor.  She is unable to eat.  She states that her throat is very sore and seems to be worsening. It is difficult to swallow. She also complains of nausea to the point that she vomits at times.  She is unable to swallow her pills without difficulty.  She reports worsening headaches. She also notices facial pain. She is frustrated today because she feels worse instead of better.  Her weight is down 28 pounds since September.  MEDICAL HISTORY: Past Medical History  Diagnosis Date  . Hypertension   . Reflux   . Hyperlipidemia   . Lung mass 07/28/2014  . Adrenal mass, right 07/28/2014  . Diabetes mellitus without complication     has Adrenal mass, right; Cancer of upper lobe of right lung; ARF (acute renal failure); and Abnormal finding on imaging on her problem list.      Cancer of upper lobe of right lung   07/28/2014 Imaging CT chest: Large R apical mass consistent with malignancy. This is destroying the R 2nd rib with extension into adjacent soft tissue. R hilar adenopathy with R 5cm adrenal metastatic lesion.   08/01/2014 Initial Biopsy Lung, needle/core biopsy(ies), right upper lobe - POORLY DIFFERENTIATED ADENOCARCINOMA, SEE COMMENT.   08/08/2014 PET scan Large hypermetabolic R apical mass with evidence of direct chest wall and mediastinal invasion, right retrocrural  lymphadenopathy, extensive retroperitoneal lymphadenopathy, and metastatic lesions to the adrenal glands    09/02/2014 -  Chemotherapy Cisplatin/Pemetrexed/Avastin every 21 days   10/07/2014 - 10/27/2014 Radiation Therapy Right lung apex for control of brachioplexopathy.     has No Known Allergies.  Melissa Sullivan does not currently have medications on file.  SURGICAL HISTORY: Past Surgical History  Procedure Laterality Date  . Appendectomy    . Lung biopsy Right 07/2014    CT guided  . Portacath placement Left 09/01/2014    SOCIAL HISTORY: History   Social History  . Marital Status: Married    Spouse Name: N/A    Number of Children: N/A  . Years of Education: N/A   Occupational History  . Not on file.   Social History Main Topics  . Smoking status: Light Tobacco Smoker -- 0.30 packs/day    Types: Cigarettes  . Smokeless tobacco: Never Used  . Alcohol Use: No  . Drug Use: No  . Sexual Activity: Not on file   Other Topics Concern  . Not on file   Social History Narrative    FAMILY HISTORY: Family History  Problem Relation Age of Onset  . Cancer Sister     Review of Systems  Constitutional: Positive for weight loss and malaise/fatigue.  HENT: Positive for congestion, nosebleeds and sore throat.   Eyes: Negative.   Respiratory: Negative.   Cardiovascular: Negative.   Gastrointestinal: Positive for nausea and vomiting.  Genitourinary: Negative.   Musculoskeletal: Negative.   Skin: Negative.   Neurological: Positive for weakness and headaches.  Endo/Heme/Allergies: Negative.   Psychiatric/Behavioral: Positive for  depression. Negative for suicidal ideas, hallucinations, memory loss and substance abuse. The patient is not nervous/anxious and does not have insomnia.     PHYSICAL EXAMINATION  ECOG PERFORMANCE STATUS: 1 - Symptomatic but completely ambulatory  Filed Vitals:   12/04/14 1200  BP: 121/84  Pulse: 107  Temp: 98.9 F (37.2 C)  Resp: 20     Physical Exam  Constitutional: She is oriented to person, place, and time.  Lying on exam table today, tearful. Thin. NAD  HENT:  Head: Normocephalic and atraumatic.  Nose: Nose normal.  Mouth/Throat: Oropharynx is clear and moist. No oropharyngeal exudate.  Eyes: Conjunctivae and EOM are normal. Pupils are equal, round, and reactive to light. Right eye exhibits no discharge. Left eye exhibits no discharge. No scleral icterus.  Neck: Normal range of motion. Neck supple. No tracheal deviation present. No thyromegaly present.  Cardiovascular: Normal rate, regular rhythm and normal heart sounds.  Exam reveals no gallop and no friction rub.   No murmur heard. Pulmonary/Chest: Effort normal and breath sounds normal. She has no wheezes. She has no rales.  Abdominal: Soft. Bowel sounds are normal. She exhibits no distension and no mass. There is no tenderness. There is no rebound and no guarding.  Thin  Musculoskeletal: Normal range of motion. She exhibits no edema.  Lymphadenopathy:    She has no cervical adenopathy.  Neurological: She is alert and oriented to person, place, and time. She has normal reflexes. No cranial nerve deficit. Gait normal. Coordination normal.  Skin: Skin is warm and dry. No rash noted.  Psychiatric: Mood, memory, affect and judgment normal.  Nursing note and vitals reviewed.   LABORATORY DATA: CBC    Component Value Date/Time   WBC 9.4 12/04/2014 1315   RBC 3.13* 12/04/2014 1315   HGB 9.4* 12/04/2014 1315   HCT 27.5* 12/04/2014 1315   PLT 323 12/04/2014 1315   MCV 87.9 12/04/2014 1315   MCH 30.0 12/04/2014 1315   MCHC 34.2 12/04/2014 1315   RDW 20.1* 12/04/2014 1315   LYMPHSABS 1.1 12/04/2014 1315   MONOABS 1.3* 12/04/2014 1315   EOSABS 0.0 12/04/2014 1315   BASOSABS 0.0 12/04/2014 1315   CMP     Component Value Date/Time   NA 136 12/04/2014 1315   K 3.4* 12/04/2014 1315   CL 99 12/04/2014 1315   CO2 29 12/04/2014 1315   GLUCOSE 97 12/04/2014  1315   BUN 9 12/04/2014 1315   CREATININE 0.86 12/04/2014 1315   CALCIUM 9.7 12/04/2014 1315   PROT 7.0 12/04/2014 1315   ALBUMIN 3.2* 12/04/2014 1315   AST 24 12/04/2014 1315   ALT 15 12/04/2014 1315   ALKPHOS 82 12/04/2014 1315   BILITOT 0.5 12/04/2014 1315   GFRNONAA 73* 12/04/2014 1315   GFRAA 85* 12/04/2014 1315    RADIOGRAPHIC STUDIES:   CLINICAL DATA: Initial treatment strategy for lung mass and right adrenal mass.  EXAM: NUCLEAR MEDICINE PET SKULL BASE TO THIGH    IMPRESSION: 1. Large hypermetabolic right apical mass with evidence of direct chest wall and mediastinal invasion, right retrocrural lymphadenopathy, extensive retroperitoneal lymphadenopathy, and metastatic lesions to the adrenal glands bilaterally, including a 6.6 x 4.2 cm hypermetabolic right adrenal mass. Given the recent biopsy diagnosis of poorly differentiated adenocarcinoma from the right apical mass, findings are favored to reflect stage IV lung cancer. 2. However, there is also extensive soft tissue thickening in the posterior nasopharynx with bilateral level IIa lymphadenopathy in the neck. The possibility of a second primary neoplasm  in this region warrants consideration. Clinical correlation is recommended. 3. There is also a small focus of hypermetabolism in the angle of the mandible on the left side, which is favored to be related to underlying dental disease, although a metastatic lesion in this region is difficult to exclude. 4. Additional findings, as above.   Electronically Signed  By: Vinnie Langton M.D.  On: 08/08/2014 11:12   IMPRESSION: Chest Impression:  1. Interval decrease in volume of right apical mass. 2. No change chest wall involvement with sclerosis of the right second rib. 3. New sclerosis of the T3 vertebral body may relate to radiation change. 4. Branching nodular pattern within the lingula and right upper lobe is likely post infectious or  inflammatory. 5. Long segment esophageal thickening presumably represents radiation related esophageal inflammation.  Abdomen / Pelvis Impression:  1. Large right adrenal metastasis is minimally smaller. Smaller left adrenal metastasis is similar. 2. Para-aortic and retrocrural lymphadenopathy are unchanged. 3. No evidence of progression in the abdomen or pelvis.   Electronically Signed  By: Suzy Bouchard M.D.  On: 12/02/2014 17:26  IMPRESSION  Decrease in size of level 2 lymph nodes as detailed above.  Diffuse thickening and mucosal enhancement throughout the palatine tonsils without focal mass. Glottic narrowing has progressed since prior exams. This may reflect result of breath holding during the recurrent exam although true stenosis not be excluded.  Circumferential thickening of the cervical and upper thoracic esophagus may indicate changes of radiation induced esophagitis.  Large right upper lobe mass with destruction of the right second rib and T2 vertebral body. Pretracheal lymph node. Please see chest CT report performed same date and dictated separately.  Lucency posterior left lower molar region most suggestive of dental disease rather than metastatic disease and has been noted on prior exams.   Electronically Signed  By: Chauncey Cruel M.D.  On: 12/02/2014 15:20 ASSESSMENT and THERAPY PLAN:    Cancer of upper lobe of right lung Stage IV adenocarcinoma of the lung. Jacques was treated with Cisplatin/Alimta/Avastin and XRT to the large RUL mass.  Imaging is improved. However, she continues to decline clinically.  Based upon her recent imaging and her complaints I suspect esophagitis, and would recommend an EGD to r/o the possibility of a superimposed infectious esophagitis. I have discussed this with the patient in detail.  She has seen Dr. [name] in the past and I will try to arrange for EGD asap with her.  Additional therapy will be on hold  until she is better and able to eat. Based upon improved disease I would change therapy to maintenance Alimta/Avastin.   Abnormal finding on imaging Jenia had abnormal findings on staging PET in the nasopharynx. She is now complaining of headaches, facial pain. Insurance denied additional imaging of this area.  I have discussed her case with Dr. Benjamine Mola who kindly agreed to see her next Thursday for endoscopy.     ARF (acute renal failure) Creatinine is back to baseline today.  I continued to encourage adequate fluid intake.       All questions were answered. The patient knows to call the clinic with any problems, questions or concerns. We can certainly see the patient much sooner if necessary.  Molli Hazard 12/04/2014

## 2014-12-04 NOTE — Patient Instructions (Addendum)
[place] at [facility]  Discharge Instructions:  I have contacted Dr.Teoh to evaluate your sinuses, we are making arrangements for you to see him next Thursday.  He will see you at 3:20 at the [facility] office.  I have also called Dr. [name] to arrange for an endoscopy to evaluate your esophagus and stomach.  I feel this is necessary to help figure out why your are having more difficulty eating/swallowing and having nausea. We will hold therapy until these issues are sorted out.  I will see you back after the above has been completed. Please make an effort to consume soft foods, boost or ensure (multiple cans a day) to keep your nutrition as sound as possible.  _______________________________________________________________  Thank you for choosing [place] at [facility] to provide your oncology and hematology care.  To afford each patient quality time with our providers, please arrive at least 15 minutes before your scheduled appointment.  You need to re-schedule your appointment if you arrive 10 or more minutes late.  We strive to give you quality time with our providers, and arriving late affects you and other patients whose appointments are after yours.  Also, if you no show three or more times for appointments you may be dismissed from the clinic.  Again, thank you for choosing [facility] [place] hope is that these requests will allow you access to exceptional care and in a timely manner. _______________________________________________________________  If you have questions after your visit, please contact our office at [phone] between the hours of 8:30 a.m. and 5:00 p.m. Voicemails left after 4:30 p.m. will not be returned until the following business day. _______________________________________________________________  For prescription refill requests, have your pharmacy contact our  office. _______________________________________________________________  Recommendations made by the consultant and any test results will be sent to your referring physician. _______________________________________________________________

## 2014-12-05 ENCOUNTER — Telehealth: Payer: Self-pay | Admitting: Nutrition

## 2014-12-05 ENCOUNTER — Ambulatory Visit (INDEPENDENT_AMBULATORY_CARE_PROVIDER_SITE_OTHER): Payer: Medicaid Other | Admitting: Nurse Practitioner

## 2014-12-05 ENCOUNTER — Encounter: Payer: Self-pay | Admitting: Nurse Practitioner

## 2014-12-05 ENCOUNTER — Other Ambulatory Visit: Payer: Self-pay

## 2014-12-05 VITALS — BP 110/70 | HR 112 | Temp 98.5°F | Ht 59.0 in | Wt 75.6 lb

## 2014-12-05 DIAGNOSIS — R1314 Dysphagia, pharyngoesophageal phase: Secondary | ICD-10-CM

## 2014-12-05 DIAGNOSIS — R112 Nausea with vomiting, unspecified: Secondary | ICD-10-CM | POA: Insufficient documentation

## 2014-12-05 DIAGNOSIS — R1312 Dysphagia, oropharyngeal phase: Secondary | ICD-10-CM | POA: Insufficient documentation

## 2014-12-05 MED ORDER — ONDANSETRON HCL 4 MG PO TABS
4.0000 mg | ORAL_TABLET | Freq: Three times a day (TID) | ORAL | Status: DC
Start: 2014-12-05 — End: 2015-02-05

## 2014-12-05 MED ORDER — SUCRALFATE 1 GM/10ML PO SUSP: 1.0000 g | Freq: Four times a day (QID) | ORAL | Status: DC

## 2014-12-05 NOTE — Assessment & Plan Note (Signed)
Patient with difficulty swallowing solid foods and pills and occasionally liquids x 1 month, recently completed XRT for stage IV lung cancer. Questy radiation related esophagitis vs. Infectious esophagitis. Cannot rule out stricture, web, or ring. Will plan for ASAP EGD +/- dilation with Dr. [name] to further evaluate. In the meantime will add carafate 1g qid to her PPI regimen.  Proceed with EGD +/- dilation with Dr. [name] in the near future. The risks, benefits, and alternatives have been discussed in detail with the patient. They state understanding and desire to proceed.

## 2014-12-05 NOTE — Telephone Encounter (Signed)
Received nutrition referral from social worker secondary to patient's extreme weight loss and poor appetite. Spoke with patient's daughter who cares for her mother. Daughter reports patient has difficulty swallowing and has had weight loss. Patient tolerates broth, Baby food and yogurt. Oral nutrition supplements are too thick. Daughter wondering if medications can be crushed for easier swallowing.  Educated patient's daughter to provide high-calorie high-protein foods in small frequent meals and snacks. Educated patient's daughter on blenderizing regular foods to consistency the patient can swallow. Recommended offering Ensure Plus or boost plus and thinning down to proper consistency, twice a day to 3 times a day between meals. Recommended patient contact pharmacy regarding medications needing to be crushed or ordered in liquid form. Questions were answered.  Teach back method used.  Contact information provided. Will mail fact sheets and coupons to patient's home address.  **Disclaimer: This note was dictated with voice recognition software. Similar sounding words can inadvertently be transcribed and this note may contain transcription errors which may not have been corrected upon publication of note.**

## 2014-12-05 NOTE — Patient Instructions (Signed)
1. We will schedule your endoscopy with Dr. [name] as soon as possible, likely early next week. 2. I will add Zofran every 8 hours to help with the nausea. 3. I will also add carafate four times a day to help with the esophagus problems 4. If your symptoms become severe, proceed to the ER for further evaluation.

## 2014-12-05 NOTE — Telephone Encounter (Signed)
error 

## 2014-12-05 NOTE — Progress Notes (Signed)
Primary Care Physician:  [name], MD Primary Gastroenterologist:  Dr. [name]  Chief Complaint  Patient presents with  . Dysphagia    HPI:   59 year old female presents for difficulty swallowing and weight loss. Has stage IV right upper lobe lung cancer with mets to bilateral adrenals, RP lymph nodes, right apical mass with chest wall and mediastinal invasion. Per oncology also extensive soft tissue thickening in the posterior nasopharynx with bilateral lymphadenopathy in the neck and questionable second primary neoplasm. Oncology discussed case with Dr. Benjamine Mola who agreed to see her next week for endoscopy. She completed XRT 10/27/14.    Today she presents statgin oncology told her to come here today and Dr. Benjamine Mola next week. Has been having difficulty swallowing x 1 month which is getting progressively worse. Admits oropharyngeal dysphagia with solid foods, pills, and occasionally liquids. At this point only able to tolerate small amounts of liquid before she'll vomit. Unable to eat solid foods at this point. Has lost 28 pounds since September. PO intake is liquids, yogurt, and small amounts of watermelon and grapes but nothing else. Last decent solid food intake over a month ago. Oncology discontinued her chemo temporarily until they "can figure out her sinuses and stomach." Also having nausea and vomiting without attempted po intake, last episode last night. Has abdominal pain twice a day regardless of not eating and typically lasts a few minutes. Has a bowel movement every couple days. Last BM today which started solid but became liquid. Denies hematochezia and melena. No other upper or lower GI issues.  Past Medical History  Diagnosis Date  . Hypertension   . Reflux   . Hyperlipidemia   . Lung mass 07/28/2014  . Adrenal mass, right 07/28/2014  . Diabetes mellitus without complication     Past Surgical History  Procedure Laterality Date  . Appendectomy    . Lung biopsy Right 07/2014   CT guided  . Portacath placement Left 09/01/2014    Current Outpatient Prescriptions  Medication Sig Dispense Refill  . Alum & Mag Hydroxide-Simeth (MAGIC MOUTHWASH W/LIDOCAINE) SOLN Take 5 mLs by mouth 4 (four) times daily as needed for mouth pain. 400 mL 4  . dexamethasone (DECADRON) 4 MG tablet The day before, day of, and day after chemo take 2 tabs in the am and 2 tabs in the pm. 36 tablet 1  . fentaNYL (DURAGESIC - DOSED MCG/HR) 12 MCG/HR Place 1 patch (12.5 mcg total) onto the skin every 3 (three) days. 10 patch 0  . fentaNYL (DURAGESIC - DOSED MCG/HR) 25 MCG/HR patch Place 1 patch (25 mcg total) onto the skin every 3 (three) days. 10 patch 0  . Fluticasone-Salmeterol (ADVAIR DISKUS) 500-50 MCG/DOSE AEPB Two puffs at the same time daily. 60 each 6  . folic acid (FOLVITE) 1 MG tablet Take 1 mg by mouth every morning.    . hydrochlorothiazide (MICROZIDE) 12.5 MG capsule Take 12.5 mg by mouth every morning.     Marland Kitchen HYDROcodone-homatropine (HYCODAN) 5-1.5 MG/5ML syrup Take 5 mLs by mouth every 6 (six) hours as needed for cough. 200 mL 0  . ibuprofen (ADVIL,MOTRIN) 200 MG tablet Take 200-800 mg by mouth once as needed for headache or mild pain.    . Iron-FA-B Cmp-C-Biot-Probiotic (FUSION PLUS) CAPS Take 1 capsule by mouth daily.     Marland Kitchen lidocaine-prilocaine (EMLA) cream Apply a quarter size amount to port site 1 hour prior to chemo. Do not rub in. Cover with plastic wrap. 30 g  3  . megestrol (MEGACE) 40 MG/ML suspension Take 400 mg by mouth daily.     . metoCLOPramide (REGLAN) 5 MG tablet Starting the day after chemo, take 1 tab four times a day x 2 days. Then may take 1 tab four times a day IF needed for nausea/vomiting. 60 tablet 2  . omeprazole (PRILOSEC) 20 MG capsule Take 20 mg by mouth every morning.     . Oxycodone HCl 10 MG TABS Take 1-2 tablets (10-20 mg total) by mouth every 6 (six) hours as needed (pain.). 100 tablet 0  . potassium chloride 20 MEQ/15ML (10%) SOLN Take 30 mLs (40 mEq  total) by mouth 2 (two) times daily. 900 mL 1  . prochlorperazine (COMPAZINE) 10 MG tablet Starting the day after chemo, take 1 tab four times a day x 2 days. Then may take 1 tab four times a day IF needed for nausea/vomiting. 60 tablet 2  . senna (SENOKOT) 8.6 MG tablet Take 1 tablet by mouth at bedtime.    . simvastatin (ZOCOR) 40 MG tablet Take 40 mg by mouth every morning.     . metFORMIN (GLUCOPHAGE) 500 MG tablet Take 1 tablet (500 mg total) by mouth 2 (two) times daily with a meal. (Patient not taking: Reported on 12/04/2014) 60 tablet 3  . triamcinolone (KENALOG) 0.025 % cream Apply 1 application topically 2 (two) times daily.     No current facility-administered medications for this visit.    Allergies as of 12/05/2014  . (No Known Allergies)    Family History  Problem Relation Age of Onset  . Cancer Sister     History   Social History  . Marital Status: Married    Spouse Name: N/A    Number of Children: N/A  . Years of Education: N/A   Occupational History  . Not on file.   Social History Main Topics  . Smoking status: Light Tobacco Smoker -- 0.30 packs/day    Types: Cigarettes  . Smokeless tobacco: Never Used  . Alcohol Use: No  . Drug Use: No  . Sexual Activity: Not on file   Other Topics Concern  . Not on file   Social History Narrative    Review of Systems: Gen: Denies any fever. Admits chills, fatigue, weight loss, lack of appetite as stated above.  CV: Denies chest pain, heart palpitations, peripheral edema, syncope.  Resp: Denies shortness of breath at rest though some shortness of breath with activity, and wheezing. Does have a persistent cough.  GI: See HPI. Denies jaundice, hematemesis. Derm: Denies rash, itching, dry skin. Does have radiation burns on her right upper chest and back which is improving with medicated cream. Psych: Denies memory loss, and confusion Heme: Denies bruising, bleeding, and enlarged lymph nodes.  Physical Exam: BP  110/70 mmHg  Pulse 112  Temp(Src) 98.5 F (36.9 C) (Oral)  Ht 4\' 11"  (1.499 m)  Wt 75 lb 9.6 oz (34.292 kg)  BMI 15.26 kg/m2  General:   Alert and oriented. Pleasant and cooperative. Appears weak and tired but non-toxic.  Head:  Normocephalic and atraumatic. Eyes:  Without icterus, sclera clear and conjunctiva pink.  Ears:  Normal auditory acuity. Mouth:  No deformity or lesions, oral mucosa pink. No overt OP edema noted. Neck:  Supple, some edema noted to the left throat with associated tenderness. Lungs:  Clear to auscultation bilaterally. No wheezes, rales, or rhonchi. No distress. Clear bronchial breath sounds with good air movement. Heart:  S1, S2 present without  murmurs appreciated.  Abdomen:  +BS, soft, non-distended. Diffuse abdominal TTP. No HSM noted. No guarding or rebound. No masses appreciated.  Rectal:  Deferred  Pulses:  Normal DP pulses noted. Extremities:  Without clubbing or edema. Neurologic:  Alert and  oriented x4;  grossly normal neurologically. Skin:  Intact without significant lesions or rashes. Psych:  Alert and cooperative. Normal mood and affect.     12/05/2014 11:46 AM

## 2014-12-05 NOTE — Assessment & Plan Note (Signed)
Nausea and vomiting associated with po intake with increase in symptom frequency now occurring even without po intake. Planning on EGD with Dr. [name] for possible esophagitis. Is already on compazine for nausea, but will add scheduled Zofran to attempt to better control her symptoms while we await EGD for further workup.

## 2014-12-06 LAB — HIV ANTIBODY (ROUTINE TESTING W REFLEX)
HIV 1/O/2 Abs-Index Value: <1 (ref <1.00)
HIV-1/HIV-2 Ab: NONREACTIVE

## 2014-12-09 ENCOUNTER — Encounter (HOSPITAL_COMMUNITY)
Admission: RE | Disposition: A | Discharge status: Home / Self Care | Payer: Self-pay | Source: Ambulatory Visit | Attending: Gastroenterology

## 2014-12-09 ENCOUNTER — Encounter (HOSPITAL_COMMUNITY): Payer: Self-pay

## 2014-12-09 ENCOUNTER — Ambulatory Visit (HOSPITAL_COMMUNITY)
Admission: RE | Admit: 2014-12-09 | Discharge: 2014-12-09 | Disposition: A | Discharge status: Home / Self Care | Payer: Medicaid Other | Source: Ambulatory Visit | Attending: Gastroenterology | Admitting: Gastroenterology

## 2014-12-09 DIAGNOSIS — E785 Hyperlipidemia, unspecified: Secondary | ICD-10-CM | POA: Insufficient documentation

## 2014-12-09 DIAGNOSIS — K222 Esophageal obstruction: Secondary | ICD-10-CM

## 2014-12-09 DIAGNOSIS — K219 Gastro-esophageal reflux disease without esophagitis: Secondary | ICD-10-CM | POA: Diagnosis not present

## 2014-12-09 DIAGNOSIS — E119 Type 2 diabetes mellitus without complications: Secondary | ICD-10-CM | POA: Diagnosis not present

## 2014-12-09 DIAGNOSIS — R1310 Dysphagia, unspecified: Secondary | ICD-10-CM | POA: Diagnosis present

## 2014-12-09 DIAGNOSIS — K319 Disease of stomach and duodenum, unspecified: Secondary | ICD-10-CM

## 2014-12-09 DIAGNOSIS — I10 Essential (primary) hypertension: Secondary | ICD-10-CM | POA: Insufficient documentation

## 2014-12-09 DIAGNOSIS — R1314 Dysphagia, pharyngoesophageal phase: Secondary | ICD-10-CM

## 2014-12-09 DIAGNOSIS — Z79899 Other long term (current) drug therapy: Secondary | ICD-10-CM | POA: Insufficient documentation

## 2014-12-09 DIAGNOSIS — Z923 Personal history of irradiation: Secondary | ICD-10-CM | POA: Insufficient documentation

## 2014-12-09 DIAGNOSIS — F17210 Nicotine dependence, cigarettes, uncomplicated: Secondary | ICD-10-CM | POA: Insufficient documentation

## 2014-12-09 DIAGNOSIS — C3491 Malignant neoplasm of unspecified part of right bronchus or lung: Secondary | ICD-10-CM | POA: Diagnosis not present

## 2014-12-09 HISTORY — PX: SAVORY DILATION: SHX5439

## 2014-12-09 HISTORY — PX: ESOPHAGOGASTRODUODENOSCOPY: SHX5428

## 2014-12-09 HISTORY — PX: MALONEY DILATION: SHX5535

## 2014-12-09 LAB — KOH PREP: KOH Prep: NONE SEEN

## 2014-12-09 LAB — GLUCOSE, CAPILLARY: GLUCOSE-CAPILLARY: 79 mg/dL (ref 70–99)

## 2014-12-09 SURGERY — EGD (ESOPHAGOGASTRODUODENOSCOPY)
Anesthesia: Moderate Sedation

## 2014-12-09 MED ORDER — MIDAZOLAM HCL 5 MG/5ML IJ SOLN
INTRAMUSCULAR | Status: DC | PRN
  Administered 2014-12-09 (×2): 2 mg via INTRAVENOUS

## 2014-12-09 MED ORDER — LIDOCAINE VISCOUS 2 % MT SOLN
OROMUCOSAL | Status: DC | PRN
  Administered 2014-12-09: 3 mL via OROMUCOSAL

## 2014-12-09 MED ORDER — PROMETHAZINE HCL 25 MG/ML IJ SOLN
INTRAMUSCULAR | Status: DC | PRN
  Administered 2014-12-09: 6.25 mg via INTRAVENOUS

## 2014-12-09 MED ORDER — LIDOCAINE VISCOUS 2 % MT SOLN
OROMUCOSAL | Status: AC
  Filled 2014-12-09: qty 15

## 2014-12-09 MED ORDER — MIDAZOLAM HCL 5 MG/5ML IJ SOLN
INTRAMUSCULAR | Status: AC
  Filled 2014-12-09: qty 10

## 2014-12-09 MED ORDER — DEXTROSE IN LACTATED RINGERS 5 % IV SOLN
INTRAVENOUS | Status: DC
  Administered 2014-12-09: 12:00:00 via INTRAVENOUS

## 2014-12-09 MED ORDER — STERILE WATER FOR IRRIGATION IR SOLN
Status: DC | PRN
  Administered 2014-12-09: 13:00:00

## 2014-12-09 MED ORDER — MINERAL OIL PO OIL
TOPICAL_OIL | ORAL | Status: AC
  Filled 2014-12-09: qty 30

## 2014-12-09 MED ORDER — MEPERIDINE HCL 100 MG/ML IJ SOLN
INTRAMUSCULAR | Status: AC
  Filled 2014-12-09: qty 2

## 2014-12-09 MED ORDER — PROMETHAZINE HCL 25 MG/ML IJ SOLN
INTRAMUSCULAR | Status: AC
  Filled 2014-12-09: qty 1

## 2014-12-09 MED ORDER — MEPERIDINE HCL 100 MG/ML IJ SOLN
INTRAMUSCULAR | Status: DC | PRN
  Administered 2014-12-09 (×2): 25 mg via INTRAVENOUS

## 2014-12-09 MED ORDER — OMEPRAZOLE 20 MG PO CPDR: 20.0000 mg | DELAYED_RELEASE_CAPSULE | Freq: Two times a day (BID) | ORAL | Status: DC

## 2014-12-09 MED ORDER — SODIUM CHLORIDE 0.9 % IV SOLN
INTRAVENOUS | Status: DC
  Administered 2014-12-09: 12:00:00 via INTRAVENOUS

## 2014-12-09 NOTE — H&P (Signed)
Primary Care Physician:  [name], MD Primary Gastroenterologist:  Dr. [name]  Pre-Procedure History & Physical: HPI:  Melissa Sullivan is a 59 y.o. female here for ODYNOPHAGIA, DYSPHAGIA(PILL/SOLIDS/LIQUIDS), & VOMITING  Past Medical History  Diagnosis Date  . Hypertension   . Reflux   . Hyperlipidemia   . Lung mass 07/28/2014  . Adrenal mass, right 07/28/2014  . Diabetes mellitus without complication    Past Surgical History  Procedure Laterality Date  . Appendectomy    . Lung biopsy Right 07/2014    CT guided  . Portacath placement Left 09/01/2014   Prior to Admission medications   Medication Sig Start Date End Date Taking? Authorizing Provider  Alum & Mag Hydroxide-Simeth (MAGIC MOUTHWASH W/LIDOCAINE) SOLN Take 5 mLs by mouth 4 (four) times daily as needed for mouth pain. 11/04/14  Yes Farrel Gobble, MD  Fluticasone-Salmeterol (ADVAIR DISKUS) 500-50 MCG/DOSE AEPB Two puffs at the same time daily. 11/05/14  Yes Farrel Gobble, MD  folic acid (FOLVITE) 1 MG tablet Take 1 mg by mouth every morning.   Yes Historical Provider, MD  hydrochlorothiazide (MICROZIDE) 12.5 MG capsule Take 12.5 mg by mouth every morning.    Yes Historical Provider, MD  ibuprofen (ADVIL,MOTRIN) 200 MG tablet Take 200-800 mg by mouth once as needed for headache or mild pain.   Yes Historical Provider, MD  Iron-FA-B Cmp-C-Biot-Probiotic (FUSION PLUS) CAPS Take 1 capsule by mouth daily.    Yes Historical Provider, MD  lidocaine-prilocaine (EMLA) cream Apply a quarter size amount to port site 1 hour prior to chemo. Do not rub in. Cover with plastic wrap. 08/19/14  Yes Farrel Gobble, MD  megestrol (MEGACE) 40 MG/ML suspension Take 400 mg by mouth daily.    Yes Historical Provider, MD  metoCLOPramide (REGLAN) 5 MG tablet Starting the day after chemo, take 1 tab four times a day x 2 days. Then may take 1 tab four times a day IF needed for nausea/vomiting. 08/19/14  Yes Farrel Gobble, MD  omeprazole  (PRILOSEC) 20 MG capsule Take 20 mg by mouth every morning.    Yes Historical Provider, MD  ondansetron (ZOFRAN) 4 MG tablet Take 1 tablet (4 mg total) by mouth every 8 (eight) hours. 12/05/14  Yes Ranae Pila, NP  Oxycodone HCl 10 MG TABS Take 1-2 tablets (10-20 mg total) by mouth every 6 (six) hours as needed (pain.). 10/14/14  Yes Farrel Gobble, MD  potassium chloride 20 MEQ/15ML (10%) SOLN Take 30 mLs (40 mEq total) by mouth 2 (two) times daily. 11/25/14  Yes Baird Cancer, PA-C  prochlorperazine (COMPAZINE) 10 MG tablet Starting the day after chemo, take 1 tab four times a day x 2 days. Then may take 1 tab four times a day IF needed for nausea/vomiting. 08/19/14  Yes Farrel Gobble, MD  senna (SENOKOT) 8.6 MG tablet Take 1 tablet by mouth at bedtime.   Yes Historical Provider, MD  simvastatin (ZOCOR) 40 MG tablet Take 40 mg by mouth every morning.    Yes Historical Provider, MD  sucralfate (CARAFATE) 1 GM/10ML suspension Take 10 mLs (1 g total) by mouth 4 (four) times daily. 12/05/14  Yes Ranae Pila, NP  triamcinolone (KENALOG) 0.025 % cream Apply 1 application topically 2 (two) times daily.   Yes Historical Provider, MD  dexamethasone (DECADRON) 4 MG tablet The day before, day of, and day after chemo take 2 tabs in the am and 2 tabs in the pm. 08/19/14   Farrel Gobble, MD  fentaNYL ([place] -  DOSED MCG/HR) 12 MCG/HR Place 1 patch (12.5 mcg total) onto the skin every 3 (three) days. 10/14/14   Farrel Gobble, MD  fentaNYL (DURAGESIC - DOSED MCG/HR) 25 MCG/HR patch Place 1 patch (25 mcg total) onto the skin every 3 (three) days. 11/04/14   Farrel Gobble, MD  HYDROcodone-homatropine [facility]) 5-1.5 MG/5ML syrup Take 5 mLs by mouth every 6 (six) hours as needed for cough. 11/28/14   Baird Cancer, PA-C  metFORMIN (GLUCOPHAGE) 500 MG tablet Take 1 tablet (500 mg total) by mouth 2 (two) times daily with a meal. Patient not taking: Reported on 12/04/2014 10/14/14   Farrel Gobble, MD    Allergies as of 12/05/2014  . (No Known Allergies)    Family History  Problem Relation Age of Onset  . Cancer Sister     History   Social History  . Marital Status: Married    Spouse Name: N/A    Number of Children: N/A  . Years of Education: N/A   Occupational History  . Not on file.   Social History Main Topics  . Smoking status: Light Tobacco Smoker -- 0.30 packs/day    Types: Cigarettes  . Smokeless tobacco: Never Used  . Alcohol Use: No  . Drug Use: No  . Sexual Activity: Not on file   Other Topics Concern  . Not on file   Social History Narrative   Review of Systems: See HPI, otherwise negative ROS  Physical Exam: BP 125/76 mmHg  Pulse 88  Temp(Src) 98.5 F (36.9 C) (Oral)  Resp 21  SpO2 100% General:   Alert,  pleasant and cooperative in NAD Head:  Normocephalic and atraumatic. Neck:  Supple; Lungs:  Clear throughout to auscultation.    Heart:  Regular rate and rhythm. Abdomen:  Soft, nontender and nondistended. Normal bowel sounds, without guarding, and without rebound.   Neurologic:  Alert and  oriented x4;  grossly normal neurologically.  Impression/Plan:    ODYNOPHAGIA/DYSPHAGIA/VOMITING  PLAN: 1. EGD/DIL

## 2014-12-09 NOTE — Progress Notes (Signed)
REVIEWED-NO ADDITIONAL RECOMMENDATIONS. 

## 2014-12-10 NOTE — Progress Notes (Signed)
cc'ed to pcp °

## 2014-12-10 NOTE — Op Note (Signed)
Melissa Sullivan, STATZER NO.:  [id number]  MEDICAL RECORD NO.:  [id number]  LOCATION:  [facility]                          FACILITY:  [facility]  PHYSICIAN:  Barney Drain, M.D.     DATE OF BIRTH:  January 31, 1956  DATE OF CONSULTATION:  12/09/2014 DATE OF DISCHARGE:  12/09/2014                                OPERATIVE REPORT   REFERRING PROVIDER:  Ancil Linsey, M.D. and Manon Hilding. K[place], PA-C  PROCEDURE:  Esophagogastroduodenoscopy with cold forceps biopsy and Savary dilation to 16 mm.  INDICATION FOR EXAM:  Ms. Zanella is a 59 year old female who has undergone treatment for stage IV cancer of the right upper lobe.  She presents with pain with swallowing liquids, solids and pills as well as difficulty swallowing liquids, solids and pills.  She is undergoing chemotherapy and radiation.  She reports her last radiation treatment was in December 2015.  On December 02, 2014, she had a CT of the chest, abdomen, pelvis and neck, which revealed a right adrenal mass, periaortic and retrocrural lymphadenopathy and a long segment of esophageal thickening.  FINDINGS: 1. Rare white plaques in the esophagus.  Brush biopsies obtained.     Otherwise, normal-appearing esophageal mucosa. 2. Stricture at the GE junction.  Narrowing of the lumen approximately     12-13 mm. 3. Erythema, edema and erosions seen in the gastric body along the     greater curvature in the antrum.  Cold forceps biopsies obtained. 4. Normal duodenum, bulb and second portion of the duodenum.  IMPRESSION: 1. No definite findings to account for changes seen on CT scan.  The     patient may have had resolving radiation or Candida esophagitis. 2. No obvious reason for 22 pounds weight loss identified. 3. Mild-to-moderate erosive gastritis. 4. Probable Candida esophagitis. 5. Distal esophageal stricture likely due to acid reflux.  RECOMMENDATIONS: 1. Continue omeprazole, increase to 30 minutes prior to meals twice  daily.  Stop taking Carafate. 2. Use Carnation instant breakfast, Ensure or Boost 4 times a day. 3. Await biopsies. 4. Follow up in March, 2016.  I discussed these findings with Mr.     Robynn Pane.  MEDICATIONS: 1. Demerol 50 mg IV. 2. Versed 4 mg IV. 3. Phenergan 6.25 mg IV because the patient states she developed     nausea and vomiting after her colonoscopy.  PROCEDURE TECHNIQUE:  Physical exam was performed and informed consent was obtained from the patient after explaining benefits, risks, and alternatives to the procedure.  The patient was connected to the monitor and placed in the left lateral position.  Continuous oxygen was provided by nasal cannula and IV medicine administered with an indwelling cannula.  After administration of sedation, the patient's esophagus was intubated and scope was advanced under direct visualization to the second portion of the duodenum.  Brush biopsies and cold forceps biopsies were obtained.  The Savary guidewire was introduced.  The scope was withdrawn slowly by carefully exam of the color, texture, anatomy and integrity of the mucosal way out.  The esophagus was dilated from 12.8-16 mm with mild resistance.  Trace heme seen on the dilators.  The dilator and the guidewire were withdrawn.  The patient was recovered in endoscopy and discharged home in satisfactory condition.     Barney Drain, M.D.     SF/MEDQ  D:  12/09/2014  T:  12/10/2014  Job:  [id number]  cc:   Ancil Linsey, M.D.  Manon Hilding. Sheldon Silvan III, PA-C Fax: [phone]

## 2014-12-11 ENCOUNTER — Ambulatory Visit (INDEPENDENT_AMBULATORY_CARE_PROVIDER_SITE_OTHER): Payer: Medicaid Other | Admitting: Otolaryngology

## 2014-12-11 DIAGNOSIS — R1312 Dysphagia, oropharyngeal phase: Secondary | ICD-10-CM

## 2014-12-16 ENCOUNTER — Inpatient Hospital Stay (HOSPITAL_COMMUNITY): Payer: PRIVATE HEALTH INSURANCE

## 2014-12-16 ENCOUNTER — Encounter: Payer: Self-pay | Admitting: *Deleted

## 2014-12-16 ENCOUNTER — Encounter (HOSPITAL_COMMUNITY): Payer: Medicaid Other | Attending: Hematology & Oncology | Admitting: Hematology & Oncology

## 2014-12-16 ENCOUNTER — Encounter (HOSPITAL_COMMUNITY): Payer: Self-pay | Admitting: Hematology & Oncology

## 2014-12-16 VITALS — BP 115/67 | HR 115 | Temp 98.6°F | Resp 18 | Wt 78.0 lb

## 2014-12-16 DIAGNOSIS — C3411 Malignant neoplasm of upper lobe, right bronchus or lung: Secondary | ICD-10-CM

## 2014-12-16 NOTE — Assessment & Plan Note (Signed)
Pleasant 59 year old female with stage IV poorly differentiated adenocarcinoma of the lung. He was treated with cisplatin, Alimta and Avastin, she also received concurrent radiation secondary to involvement of the right brachial plexus. Her pain is markedly improved and last imaging studies showed improvement in her disease overall. We discussed proceeding with maintenance Alimta and Avastin today, she would like to start that next week.  She lost significant amount of weight since September. She reported worsening pain with swallowing and difficulty swallowing solid food. She had an EGD with dilation with Dr. [name], final report is not available but the patient states she also had gastritis. Her weight is up some and she is eating much better.  She saw Dr. Benjamine Mola for abnormalities noted in the nasopharynx on initial imaging. The patient reports she was told everything was fine. We will see her back next week, reassess her weight and plan on starting maintenance therapy.

## 2014-12-16 NOTE — Progress Notes (Signed)
[name] Clinical Social Work  Clinical Social Work was referred by [name] rounding for assessment of psychosocial needs due to past transportation and financial concerns.  Clinical Social Worker met with patient at [facility] to offer support and re-assess for needs.  Pt reports she is able to eat better and seemed in good spirits today. CSW informed her that CSW received email from [facility] with the [employer] [name] that they were approved for gas card assistance. Pt aware to look out for gas card in the mail. CSW also provided pt with coupons for Boost from dietician, [name]. Pt denied other concerns currently and is aware of how to reach out to CSW as needed.    Clinical Social Work interventions: Resource assistance   [name], [place] Tuesdays 8:30-1pm Wednesdays 8:30-12pm  [phone]

## 2014-12-16 NOTE — Patient Instructions (Signed)
[place] at [facility]  Discharge Instructions:  We will plan on starting your maintenance chemotherapy.  You will get 2 of the drugs you got before. NO MORE CISPLATIN We will see you next week. Please keep eating everything you can!  _______________________________________________________________  Thank you for choosing [place] at [facility] to provide your oncology and hematology care.  To afford each patient quality time with our providers, please arrive at least 15 minutes before your scheduled appointment.  You need to re-schedule your appointment if you arrive 10 or more minutes late.  We strive to give you quality time with our providers, and arriving late affects you and other patients whose appointments are after yours.  Also, if you no show three or more times for appointments you may be dismissed from the clinic.  Again, thank you for choosing [place] at [place] hope is that these requests will allow you access to exceptional care and in a timely manner. _______________________________________________________________  If you have questions after your visit, please contact our office at [phone] between the hours of 8:30 a.m. and 5:00 p.m. Voicemails left after 4:30 p.m. will not be returned until the following business day. _______________________________________________________________  For prescription refill requests, have your pharmacy contact our office. _______________________________________________________________  Recommendations made by the consultant and any test results will be sent to your referring physician. _______________________________________________________________

## 2014-12-16 NOTE — Progress Notes (Signed)
[name], stage IV, bilateral adrenal metastases, retroperitoneal lymph nodes  Biopsy on 08/01/2014 c/w poorly differentiated adenocarcinoma  XRT completed on 10/27/2014  Right Brachial plexus neuralgia  EGFR mutation negative ALK mutation negative  Extensive soft tissue thickening in the posterior nasopharynx with bilateral level IIa lymphadenopathy in the neck. The possibility of a second primary neoplasm in this region warrants consideration. Clinical correlation is recommended.   CURRENT THERAPY: Cisplatin/Alimta/Avastin  INTERVAL HISTORY: Melissa Sullivan 59 y.o. female returns for follow-up of stage IV adenocarcinoma of the lung.  She is doing better. She is smiling today. She has been able to eat without nausea or pain with swallowing. She had an EGD and dilation with Dr. [name] last week.  Her weight is up several pounds. She is sleeping better. She smokes 3 to 4 cigarettes a day.   MEDICAL HISTORY: Past Medical History  Diagnosis Date  . Hypertension   . Reflux   . Hyperlipidemia   . Lung mass 07/28/2014  . Adrenal mass, right 07/28/2014  . Diabetes mellitus without complication     has Adrenal mass, right; Cancer of upper lobe of right lung; ARF (acute renal failure); Abnormal finding on imaging; Oropharyngeal dysphagia; and Nausea and vomiting on her problem list.      Cancer of upper lobe of right lung   07/28/2014 Imaging CT chest: Large R apical mass consistent with malignancy. This is destroying the R 2nd rib with extension into adjacent soft tissue. R hilar adenopathy with R 5cm adrenal metastatic lesion.   08/01/2014 Initial Biopsy Lung, needle/core biopsy(ies), right upper lobe - POORLY DIFFERENTIATED ADENOCARCINOMA, SEE COMMENT.   08/08/2014 PET scan Large hypermetabolic R apical mass with evidence of direct chest wall and mediastinal invasion, right retrocrural lymphadenopathy, extensive retroperitoneal lymphadenopathy, and metastatic lesions to the  adrenal glands    09/02/2014 -  Chemotherapy Cisplatin/Pemetrexed/Avastin every 21 days   10/07/2014 - 10/27/2014 Radiation Therapy Right lung apex for control of brachioplexopathy.     has No Known Allergies.  Ms. Kilmer does not currently have medications on file.  SURGICAL HISTORY: Past Surgical History  Procedure Laterality Date  . Appendectomy    . Lung biopsy Right 07/2014    CT guided  . Portacath placement Left 09/01/2014    SOCIAL HISTORY: History   Social History  . Marital Status: Married    Spouse Name: N/A    Number of Children: N/A  . Years of Education: N/A   Occupational History  . Not on file.   Social History Main Topics  . Smoking status: Light Tobacco Smoker -- 0.30 packs/day    Types: Cigarettes  . Smokeless tobacco: Never Used  . Alcohol Use: No  . Drug Use: No  . Sexual Activity: Not on file   Other Topics Concern  . Not on file   Social History Narrative    FAMILY HISTORY: Family History  Problem Relation Age of Onset  . Cancer Sister     Review of Systems  Constitutional: Negative for fever, chills, weight loss and malaise/fatigue.  HENT: Negative for congestion, hearing loss, nosebleeds, sore throat and tinnitus.   Eyes: Negative for blurred vision, double vision, pain and discharge.  Respiratory: Negative for cough, hemoptysis, sputum production, shortness of breath and wheezing.   Cardiovascular: Negative for chest pain, palpitations, claudication, leg swelling and PND.  Gastrointestinal: Negative for heartburn, nausea, vomiting, abdominal pain, diarrhea, constipation, blood in stool and melena.  Genitourinary: Negative for dysuria, urgency, frequency and  hematuria.  Musculoskeletal: Negative for myalgias, joint pain and falls.  Skin: Negative for itching and rash.  Neurological: Negative for dizziness, tingling, tremors, sensory change, speech change, focal weakness, seizures, loss of consciousness, weakness and headaches.    Endo/Heme/Allergies: Does not bruise/bleed easily.  Psychiatric/Behavioral: Negative for depression, suicidal ideas, memory loss and substance abuse. The patient is not nervous/anxious and does not have insomnia.     PHYSICAL EXAMINATION  ECOG PERFORMANCE STATUS: 1 - Symptomatic but completely ambulatory  Filed Vitals:   12/16/14 1000  BP: 115/67  Pulse: 115  Temp: 98.6 F (37 C)  Resp: 18    Physical Exam  Constitutional: She is oriented to person, place, and time.  Lying on exam table today, tearful. Thin. NAD  HENT:  Head: Normocephalic and atraumatic.  Nose: Nose normal.  Mouth/Throat: Oropharynx is clear and moist. No oropharyngeal exudate.  Eyes: Conjunctivae and EOM are normal. Pupils are equal, round, and reactive to light. Right eye exhibits no discharge. Left eye exhibits no discharge. No scleral icterus.  Neck: Normal range of motion. Neck supple. No tracheal deviation present. No thyromegaly present.  Cardiovascular: Normal rate, regular rhythm and normal heart sounds.  Exam reveals no gallop and no friction rub.   No murmur heard. Pulmonary/Chest: Effort normal and breath sounds normal. She has no wheezes. She has no rales.  Abdominal: Soft. Bowel sounds are normal. She exhibits no distension and no mass. There is no tenderness. There is no rebound and no guarding.  Thin  Musculoskeletal: Normal range of motion. She exhibits no edema.  Lymphadenopathy:    She has no cervical adenopathy.  Neurological: She is alert and oriented to person, place, and time. She has normal reflexes. No cranial nerve deficit. Gait normal. Coordination normal.  Skin: Skin is warm and dry. No rash noted.  Psychiatric: Mood, memory, affect and judgment normal.  Nursing note and vitals reviewed.   LABORATORY DATA: CBC    Component Value Date/Time   WBC 9.4 12/04/2014 1315   RBC 3.13* 12/04/2014 1315   HGB 9.4* 12/04/2014 1315   HCT 27.5* 12/04/2014 1315   PLT 323 12/04/2014 1315    MCV 87.9 12/04/2014 1315   MCH 30.0 12/04/2014 1315   MCHC 34.2 12/04/2014 1315   RDW 20.1* 12/04/2014 1315   LYMPHSABS 1.1 12/04/2014 1315   MONOABS 1.3* 12/04/2014 1315   EOSABS 0.0 12/04/2014 1315   BASOSABS 0.0 12/04/2014 1315   CMP     Component Value Date/Time   NA 136 12/04/2014 1315   K 3.4* 12/04/2014 1315   CL 99 12/04/2014 1315   CO2 29 12/04/2014 1315   GLUCOSE 97 12/04/2014 1315   BUN 9 12/04/2014 1315   CREATININE 0.86 12/04/2014 1315   CALCIUM 9.7 12/04/2014 1315   PROT 7.0 12/04/2014 1315   ALBUMIN 3.2* 12/04/2014 1315   AST 24 12/04/2014 1315   ALT 15 12/04/2014 1315   ALKPHOS 82 12/04/2014 1315   BILITOT 0.5 12/04/2014 1315   GFRNONAA 73* 12/04/2014 1315   GFRAA 85* 12/04/2014 1315    ASSESSMENT and THERAPY PLAN:    Cancer of upper lobe of right lung Pleasant 59 year old female with stage IV poorly differentiated adenocarcinoma of the lung. He was treated with cisplatin, Alimta and Avastin, she also received concurrent radiation secondary to involvement of the right brachial plexus. Her pain is markedly improved and last imaging studies showed improvement in her disease overall. We discussed proceeding with maintenance Alimta and Avastin today, she would like  to start that next week.  She lost significant amount of weight since September. She reported worsening pain with swallowing and difficulty swallowing solid food. She had an EGD with dilation with Dr. [name], final report is not available but the patient states she also had gastritis. Her weight is up some and she is eating much better.  She saw Dr. Benjamine Mola for abnormalities noted in the nasopharynx on initial imaging. The patient reports she was told everything was fine. We will see her back next week, reassess her weight and plan on starting maintenance therapy.     All questions were answered. The patient knows to call the clinic with any problems, questions or concerns. We can certainly see the  patient much sooner if necessary.  Molli Hazard 12/16/2014

## 2014-12-17 ENCOUNTER — Encounter (HOSPITAL_COMMUNITY): Payer: Self-pay | Admitting: Gastroenterology

## 2014-12-24 ENCOUNTER — Encounter (HOSPITAL_COMMUNITY): Payer: Self-pay | Admitting: Hematology & Oncology

## 2014-12-24 ENCOUNTER — Encounter (HOSPITAL_COMMUNITY): Payer: Self-pay

## 2014-12-24 ENCOUNTER — Encounter (HOSPITAL_BASED_OUTPATIENT_CLINIC_OR_DEPARTMENT_OTHER): Payer: Medicaid Other

## 2014-12-24 ENCOUNTER — Encounter (HOSPITAL_COMMUNITY): Payer: Medicaid Other | Attending: Hematology and Oncology | Admitting: Hematology & Oncology

## 2014-12-24 DIAGNOSIS — C3411 Malignant neoplasm of upper lobe, right bronchus or lung: Secondary | ICD-10-CM | POA: Diagnosis not present

## 2014-12-24 DIAGNOSIS — K219 Gastro-esophageal reflux disease without esophagitis: Secondary | ICD-10-CM | POA: Insufficient documentation

## 2014-12-24 DIAGNOSIS — C7971 Secondary malignant neoplasm of right adrenal gland: Secondary | ICD-10-CM | POA: Insufficient documentation

## 2014-12-24 DIAGNOSIS — E785 Hyperlipidemia, unspecified: Secondary | ICD-10-CM | POA: Diagnosis not present

## 2014-12-24 DIAGNOSIS — Z5111 Encounter for antineoplastic chemotherapy: Secondary | ICD-10-CM

## 2014-12-24 DIAGNOSIS — J449 Chronic obstructive pulmonary disease, unspecified: Secondary | ICD-10-CM | POA: Insufficient documentation

## 2014-12-24 DIAGNOSIS — C7951 Secondary malignant neoplasm of bone: Secondary | ICD-10-CM

## 2014-12-24 DIAGNOSIS — C7989 Secondary malignant neoplasm of other specified sites: Secondary | ICD-10-CM | POA: Insufficient documentation

## 2014-12-24 DIAGNOSIS — C7972 Secondary malignant neoplasm of left adrenal gland: Secondary | ICD-10-CM | POA: Insufficient documentation

## 2014-12-24 DIAGNOSIS — Z9089 Acquired absence of other organs: Secondary | ICD-10-CM | POA: Insufficient documentation

## 2014-12-24 DIAGNOSIS — I10 Essential (primary) hypertension: Secondary | ICD-10-CM | POA: Diagnosis not present

## 2014-12-24 DIAGNOSIS — C781 Secondary malignant neoplasm of mediastinum: Secondary | ICD-10-CM | POA: Diagnosis not present

## 2014-12-24 DIAGNOSIS — Z5112 Encounter for antineoplastic immunotherapy: Secondary | ICD-10-CM

## 2014-12-24 DIAGNOSIS — F17210 Nicotine dependence, cigarettes, uncomplicated: Secondary | ICD-10-CM | POA: Insufficient documentation

## 2014-12-24 DIAGNOSIS — C786 Secondary malignant neoplasm of retroperitoneum and peritoneum: Secondary | ICD-10-CM

## 2014-12-24 DIAGNOSIS — Z452 Encounter for adjustment and management of vascular access device: Secondary | ICD-10-CM

## 2014-12-24 DIAGNOSIS — Z79899 Other long term (current) drug therapy: Secondary | ICD-10-CM | POA: Diagnosis not present

## 2014-12-24 DIAGNOSIS — C772 Secondary and unspecified malignant neoplasm of intra-abdominal lymph nodes: Secondary | ICD-10-CM | POA: Diagnosis not present

## 2014-12-24 LAB — CBC WITH DIFFERENTIAL/PLATELET
Basophils Absolute: 0 10*3/uL (ref 0.0–0.1)
Basophils Relative: 0 % (ref 0–1)
EOS ABS: 0 10*3/uL (ref 0.0–0.7)
Eosinophils Relative: 0 % (ref 0–5)
HCT: 27.9 % — ABNORMAL LOW (ref 36.0–46.0)
Hemoglobin: 8.9 g/dL — ABNORMAL LOW (ref 12.0–15.0)
LYMPHS ABS: 1 10*3/uL (ref 0.7–4.0)
LYMPHS PCT: 8 % — AB (ref 12–46)
MCH: 29.9 pg (ref 26.0–34.0)
MCHC: 31.9 g/dL (ref 30.0–36.0)
MCV: 93.6 fL (ref 78.0–100.0)
Monocytes Absolute: 0.9 10*3/uL (ref 0.1–1.0)
Monocytes Relative: 7 % (ref 3–12)
Neutro Abs: 10.4 10*3/uL — ABNORMAL HIGH (ref 1.7–7.7)
Neutrophils Relative %: 85 % — ABNORMAL HIGH (ref 43–77)
PLATELETS: 303 10*3/uL (ref 150–400)
RBC: 2.98 MIL/uL — AB (ref 3.87–5.11)
RDW: 18.5 % — AB (ref 11.5–15.5)
WBC: 12.4 10*3/uL — AB (ref 4.0–10.5)

## 2014-12-24 LAB — URINALYSIS, DIPSTICK ONLY
Bilirubin Urine: NEGATIVE
Glucose, UA: NEGATIVE mg/dL
HGB URINE DIPSTICK: NEGATIVE
NITRITE: NEGATIVE
Protein, ur: 30 mg/dL — AB
Specific Gravity, Urine: 1.015 (ref 1.005–1.030)
Urobilinogen, UA: 0.2 mg/dL (ref 0.0–1.0)
pH: 6 (ref 5.0–8.0)

## 2014-12-24 LAB — COMPREHENSIVE METABOLIC PANEL
ALBUMIN: 3.3 g/dL — AB (ref 3.5–5.2)
ALK PHOS: 81 U/L (ref 39–117)
ALT: 10 U/L (ref 0–35)
ANION GAP: 6 (ref 5–15)
AST: 18 U/L (ref 0–37)
BUN: 16 mg/dL (ref 6–23)
CALCIUM: 10.6 mg/dL — AB (ref 8.4–10.5)
CO2: 26 mmol/L (ref 19–32)
Chloride: 98 mmol/L (ref 96–112)
Creatinine, Ser: 1.09 mg/dL (ref 0.50–1.10)
GFR calc Af Amer: 64 mL/min — ABNORMAL LOW (ref >90)
GFR calc non Af Amer: 55 mL/min — ABNORMAL LOW (ref >90)
Glucose, Bld: 132 mg/dL — ABNORMAL HIGH (ref 70–99)
POTASSIUM: 5.3 mmol/L — AB (ref 3.5–5.1)
Sodium: 130 mmol/L — ABNORMAL LOW (ref 135–145)
TOTAL PROTEIN: 8.2 g/dL (ref 6.0–8.3)
Total Bilirubin: 0.3 mg/dL (ref 0.3–1.2)

## 2014-12-24 MED ORDER — ALTEPLASE 2 MG IJ SOLR
2.0000 mg | Freq: Once | INTRAMUSCULAR | Status: AC | PRN
  Administered 2014-12-24: 2 mg

## 2014-12-24 MED ORDER — SODIUM CHLORIDE 0.9 % IV SOLN
Freq: Once | INTRAVENOUS | Status: AC
  Administered 2014-12-24: 8 mg via INTRAVENOUS
  Filled 2014-12-24: qty 4

## 2014-12-24 MED ORDER — STERILE WATER FOR INJECTION IJ SOLN
INTRAMUSCULAR | Status: AC
  Filled 2014-12-24: qty 10

## 2014-12-24 MED ORDER — SODIUM CHLORIDE 0.9 % IJ SOLN
10.0000 mL | INTRAMUSCULAR | Status: DC | PRN
  Administered 2014-12-24: 10 mL
  Filled 2014-12-24: qty 10

## 2014-12-24 MED ORDER — ALTEPLASE 2 MG IJ SOLR
INTRAMUSCULAR | Status: AC
  Filled 2014-12-24: qty 2

## 2014-12-24 MED ORDER — SODIUM CHLORIDE 0.9 % IV SOLN: 8.0000 mg | Freq: Once | INTRAVENOUS | Status: DC

## 2014-12-24 MED ORDER — CYANOCOBALAMIN 1000 MCG/ML IJ SOLN
1000.0000 ug | Freq: Once | INTRAMUSCULAR | Status: AC
  Administered 2014-12-24: 1000 ug via INTRAMUSCULAR

## 2014-12-24 MED ORDER — CYANOCOBALAMIN 1000 MCG/ML IJ SOLN
INTRAMUSCULAR | Status: AC
  Filled 2014-12-24: qty 1

## 2014-12-24 MED ORDER — SODIUM CHLORIDE 0.9 % IV SOLN
500.0000 mg/m2 | Freq: Once | INTRAVENOUS | Status: AC
  Administered 2014-12-24: 600 mg via INTRAVENOUS
  Filled 2014-12-24: qty 24

## 2014-12-24 MED ORDER — SODIUM CHLORIDE 0.9 % IV SOLN: 15.0000 mg/kg | Freq: Once | INTRAVENOUS | Status: DC

## 2014-12-24 MED ORDER — BEVACIZUMAB CHEMO INJECTION 400 MG/16ML
15.0000 mg/kg | Freq: Once | INTRAVENOUS | Status: AC
  Administered 2014-12-24: 525 mg via INTRAVENOUS
  Filled 2014-12-24: qty 21

## 2014-12-24 MED ORDER — HEPARIN SOD (PORK) LOCK FLUSH 100 UNIT/ML IV SOLN
500.0000 [IU] | Freq: Once | INTRAVENOUS | Status: AC | PRN
  Administered 2014-12-24: 500 [IU]

## 2014-12-24 MED ORDER — DEXAMETHASONE SODIUM PHOSPHATE 10 MG/ML IJ SOLN: 10.0000 mg | Freq: Once | INTRAMUSCULAR | Status: DC

## 2014-12-24 MED ORDER — SODIUM CHLORIDE 0.9 % IV SOLN
Freq: Once | INTRAVENOUS | Status: AC
  Administered 2014-12-24: 14:00:00 via INTRAVENOUS

## 2014-12-24 MED ORDER — ZOLEDRONIC ACID 4 MG/5ML IV CONC
3.5000 mg | Freq: Once | INTRAVENOUS | Status: AC
  Administered 2014-12-24: 3.5 mg via INTRAVENOUS
  Filled 2014-12-24: qty 4.38

## 2014-12-24 NOTE — Patient Instructions (Signed)
[facility] Discharge Instructions for Patients Receiving Chemotherapy  Today you received the following chemotherapy agents Pemetrexed and Avastin. You also received a B12 injection and a Zometa infusion.  To help prevent nausea and vomiting after your treatment, we encourage you to take your nausea medication as instructed.   If you develop nausea and vomiting that is not controlled by your nausea medication, call the clinic. If it is after clinic hours your family physician or the after hours number for the clinic or go to the Emergency Department.  BELOW ARE SYMPTOMS THAT SHOULD BE REPORTED IMMEDIATELY:  *FEVER GREATER THAN 101.0 F  *CHILLS WITH OR WITHOUT FEVER  NAUSEA AND VOMITING THAT IS NOT CONTROLLED WITH YOUR NAUSEA MEDICATION  *UNUSUAL SHORTNESS OF BREATH  *UNUSUAL BRUISING OR BLEEDING  TENDERNESS IN MOUTH AND THROAT WITH OR WITHOUT PRESENCE OF ULCERS  *URINARY PROBLEMS  *BOWEL PROBLEMS  UNUSUAL RASH Items with * indicate a potential emergency and should be followed up as soon as possible.  Return as scheduled.  I have been informed and understand all the instructions given to me. I know to contact the clinic, my physician, or go to the Emergency Department if any problems should occur. I do not have any questions at this time, but understand that I may call the clinic during office hours or the Patient Navigator at [phone] should I have any questions or need assistance in obtaining follow up care.    __________________________________________  _____________  __________ Signature of Patient or Authorized Representative            Date                   Time    __________________________________________ Nurse's Signature

## 2014-12-24 NOTE — Progress Notes (Signed)
1240 No blood return from port. Alteplase instilled per policy. Labs reviewed by Dr.Penland, OK to treat with chemo today. 1345 Easily withdrew alteplase plus 20ml blood from port. Melissa Sullivan presents today for injection per MD orders. B12 1000 mcg administered IM in left Upper Arm. Administration without incident. Patient tolerated well. Tolerated chemo well.

## 2014-12-24 NOTE — Assessment & Plan Note (Signed)
59 year old female with stage IV adenocarcinoma of the lung with bilateral adrenal metastases, retroperitoneal lymph nodes, and bony metastatic disease. Prior to therapy she had right brachial plexus neuralgia and significant pain; that pain is now gone and she is doing well. We had some difficulties with swallowing, significant weight loss and abdominal pain. These symptoms are all improved, she had an EGD with Dr. [name].  She is here today for her first treatment with maintenance Alimta and Avastin. We will plan on seeing her back in 3 weeks prior to her next cycle. I advised the patient to call with any problems or concerns prior to her follow-up including but not limited to nausea, vomiting, abdominal pain, fever.

## 2014-12-24 NOTE — Progress Notes (Signed)
[name], stage IV, bilateral adrenal metastases, retroperitoneal lymph nodes  Biopsy on 08/01/2014 c/w poorly differentiated adenocarcinoma  XRT completed on 10/27/2014  Right Brachial plexus neuralgia  EGFR mutation negative ALK mutation negative  Extensive soft tissue thickening in the posterior nasopharynx with bilateral level IIa lymphadenopathy in the neck. The possibility of a second primary neoplasm in this region warrants consideration. Clinical correlation is recommended.  CURRENT THERAPY: Cisplatin/Alimta/Avastin  INTERVAL HISTORY: Melissa Sullivan 59 y.o. female returns for follow-up of stage IV adenocarcinoma of the lung. She is here to receive cycle #1 of maintenance alimta/avastin. She is eating better. She denies any pain.   MEDICAL HISTORY: Past Medical History  Diagnosis Date  . Hypertension   . Reflux   . Hyperlipidemia   . Lung mass 07/28/2014  . Adrenal mass, right 07/28/2014  . Diabetes mellitus without complication     has Adrenal mass, right; Cancer of upper lobe of right lung; ARF (acute renal failure); Abnormal finding on imaging; Oropharyngeal dysphagia; Nausea and vomiting; and Hypercalcemia on her problem list.      Cancer of upper lobe of right lung   07/28/2014 Imaging CT chest: Large R apical mass consistent with malignancy. This is destroying the R 2nd rib with extension into adjacent soft tissue. R hilar adenopathy with R 5cm adrenal metastatic lesion.   08/01/2014 Initial Biopsy Lung, needle/core biopsy(ies), right upper lobe - POORLY DIFFERENTIATED ADENOCARCINOMA, SEE COMMENT.   08/08/2014 PET scan Large hypermetabolic R apical mass with evidence of direct chest wall and mediastinal invasion, right retrocrural lymphadenopathy, extensive retroperitoneal lymphadenopathy, and metastatic lesions to the adrenal glands    09/02/2014 -  Chemotherapy Cisplatin/Pemetrexed/Avastin every 21 days   10/07/2014 - 10/27/2014 Radiation Therapy Right lung  apex for control of brachioplexopathy.     has No Known Allergies.  Ms. Sampath does not currently have medications on file.  SURGICAL HISTORY: Past Surgical History  Procedure Laterality Date  . Appendectomy    . Lung biopsy Right 07/2014    CT guided  . Portacath placement Left 09/01/2014  . Esophagogastroduodenoscopy N/A 12/09/2014    Procedure: ESOPHAGOGASTRODUODENOSCOPY (EGD);  Surgeon: [name], MD;  [facility];  Service: Endoscopy;  Laterality: N/A;  1215  . Savory dilation N/A 12/09/2014    Procedure: SAVORY DILATION;  Surgeon: [name], MD;  [facility];  Service: Endoscopy;  Laterality: N/A;  [name] dilation N/A 12/09/2014    Procedure: [name] DILATION;  Surgeon: [name], MD;  Location: [facility];  Service: Endoscopy;  Laterality: N/A;    SOCIAL HISTORY: History   Social History  . Marital Status: Married    Spouse Name: N/A    Number of Children: N/A  . Years of Education: N/A   Occupational History  . Not on file.   Social History Main Topics  . Smoking status: Light Tobacco Smoker -- 0.30 packs/day    Types: Cigarettes  . Smokeless tobacco: Never Used  . Alcohol Use: No  . Drug Use: No  . Sexual Activity: Not on file   Other Topics Concern  . Not on file   Social History Narrative    FAMILY HISTORY: Family History  Problem Relation Age of Onset  . Cancer Sister     Review of Systems  Constitutional: Negative for fever, chills, weight loss and malaise/fatigue.  HENT: Negative for congestion, hearing loss, nosebleeds, sore throat and tinnitus.   Eyes: Negative for blurred vision,  double vision, pain and discharge.  Respiratory: Negative for cough, hemoptysis, sputum production, shortness of breath and wheezing.   Cardiovascular: Negative for chest pain, palpitations, claudication, leg swelling and PND.  Gastrointestinal: Negative for heartburn, nausea, vomiting, abdominal pain, diarrhea,  constipation, blood in stool and melena.  Genitourinary: Negative for dysuria, urgency, frequency and hematuria.  Musculoskeletal: Negative for myalgias, joint pain and falls.  Skin: Negative for itching and rash.  Neurological: Negative for dizziness, tingling, tremors, sensory change, speech change, focal weakness, seizures, loss of consciousness, weakness and headaches.  Endo/Heme/Allergies: Does not bruise/bleed easily.  Psychiatric/Behavioral: Negative for depression, suicidal ideas, memory loss and substance abuse. The patient is not nervous/anxious and does not have insomnia.     PHYSICAL EXAMINATION  ECOG PERFORMANCE STATUS: 1 - Symptomatic but completely ambulatory  There were no vitals filed for this visit.  Physical Exam  Constitutional: She is oriented to person, place, and time.  HENT:  Head: Normocephalic and atraumatic.  Nose: Nose normal.  Mouth/Throat: Oropharynx is clear and moist. No oropharyngeal exudate.  Eyes: Conjunctivae and EOM are normal. Pupils are equal, round, and reactive to light. Right eye exhibits no discharge. Left eye exhibits no discharge. No scleral icterus.  Neck: Normal range of motion. Neck supple. No tracheal deviation present. No thyromegaly present.  Cardiovascular: Normal rate, regular rhythm and normal heart sounds.  Exam reveals no gallop and no friction rub.   No murmur heard. Pulmonary/Chest: Effort normal and breath sounds normal. She has no wheezes. She has no rales.  Abdominal: Soft. Bowel sounds are normal. She exhibits no distension and no mass. There is no tenderness. There is no rebound and no guarding.  Thin  Musculoskeletal: Normal range of motion. She exhibits no edema.  Lymphadenopathy:    She has no cervical adenopathy.  Neurological: She is alert and oriented to person, place, and time. She has normal reflexes. No cranial nerve deficit. Gait normal. Coordination normal.  Skin: Skin is warm and dry. No rash noted.    Psychiatric: Mood, memory, affect and judgment normal.  Nursing note and vitals reviewed.   LABORATORY DATA: CBC    Component Value Date/Time   WBC 12.4* 12/24/2014 1149   RBC 2.98* 12/24/2014 1149   HGB 8.9* 12/24/2014 1149   HCT 27.9* 12/24/2014 1149   PLT 303 12/24/2014 1149   MCV 93.6 12/24/2014 1149   MCH 29.9 12/24/2014 1149   MCHC 31.9 12/24/2014 1149   RDW 18.5* 12/24/2014 1149   LYMPHSABS 1.0 12/24/2014 1149   MONOABS 0.9 12/24/2014 1149   EOSABS 0.0 12/24/2014 1149   BASOSABS 0.0 12/24/2014 1149   CMP     Component Value Date/Time   NA 130* 12/24/2014 1149   K 5.3* 12/24/2014 1149   CL 98 12/24/2014 1149   CO2 26 12/24/2014 1149   GLUCOSE 132* 12/24/2014 1149   BUN 16 12/24/2014 1149   CREATININE 1.09 12/24/2014 1149   CALCIUM 10.6* 12/24/2014 1149   PROT 8.2 12/24/2014 1149   ALBUMIN 3.3* 12/24/2014 1149   AST 18 12/24/2014 1149   ALT 10 12/24/2014 1149   ALKPHOS 81 12/24/2014 1149   BILITOT 0.3 12/24/2014 1149   GFRNONAA 55* 12/24/2014 1149   GFRAA 64* 12/24/2014 1149    ASSESSMENT and THERAPY PLAN:    Cancer of upper lobe of right lung 59 year old female with stage IV adenocarcinoma of the lung with bilateral adrenal metastases, retroperitoneal lymph nodes, and bony metastatic disease. Prior to therapy she had right brachial plexus neuralgia  and significant pain; that pain is now gone and she is doing well. We had some difficulties with swallowing, significant weight loss and abdominal pain. These symptoms are all improved, she had an EGD with Dr. [name].  She is here today for her first treatment with maintenance Alimta and Avastin. We will plan on seeing her back in 3 weeks prior to her next cycle. I advised the patient to call with any problems or concerns prior to her follow-up including but not limited to nausea, vomiting, abdominal pain, fever.   Hypercalcemia She was noted to have hypercalcemia today. She has not been on a bisphosphonate or  XGEVA. She does have known bony metastatic disease. She is not taking any calcium supplementation. I gave her 1 dose of Zometa IV. I discussed the hypercalcemia with her. I advised her to call if she has any problems again with abdominal pain, confusion, or excessive fatigue. Plan for now will be continuing her Zometa monthly. We will recheck calcium at her follow-up.     All questions were answered. The patient knows to call the clinic with any problems, questions or concerns. We can certainly see the patient much sooner if necessary.  Melissa Sullivan 12/24/2014

## 2014-12-24 NOTE — Assessment & Plan Note (Signed)
She was noted to have hypercalcemia today. She has not been on a bisphosphonate or XGEVA. She does have known bony metastatic disease. She is not taking any calcium supplementation. I gave her 1 dose of Zometa IV. I discussed the hypercalcemia with her. I advised her to call if she has any problems again with abdominal pain, confusion, or excessive fatigue. Plan for now will be continuing her Zometa monthly. We will recheck calcium at her follow-up.

## 2014-12-24 NOTE — Progress Notes (Signed)
Labs for cmp cbcd

## 2014-12-25 ENCOUNTER — Telehealth: Payer: Self-pay | Admitting: Gastroenterology

## 2014-12-25 NOTE — Telephone Encounter (Signed)
Please call pt. HER stomach Bx shows gastritis.    CONTINUE OMEPRAZOLE.  TAKE 30 MINUTES PRIOR TO MEALS TWICE DAILY.  STOP CARAFATE.  USE CARNATION INSTANT BREAKFAST, ENSURE, OR BOOST FOUR TIMES A DAY.  FOLLOW UP IN MAR 2016 E30 DYSPHAGIA.Marland Kitchen

## 2014-12-25 NOTE — Discharge Instructions (Signed)
I dilated your esophagus. You have a stricture near the base of your esophagus. You have gastritis. I biopsied your stomach.   CONTINUE OMEPRAZOLE.  TAKE 30 MINUTES PRIOR TO MEALS TWICE DAILY.  STOP CARAFATE.  USE CARNATION INSTANT BREAKFAST, ENSURE, OR BOOST FOUR TIMES A DAY.  YOUR BIOPSY WILL BE BACK IN 14 DAYS OR YOU CAN LOOK THEM UP ON MY CHART AFTER JAN 22.  FOLLOW UP IN [facility] 2016.   UPPER ENDOSCOPY AFTER CARE Read the instructions outlined below and refer to this sheet in the next week. These discharge instructions provide you with general information on caring for yourself after you leave the hospital. While your treatment has been planned according to the most current medical practices available, unavoidable complications occasionally occur. If you have any problems or questions after discharge, call DR. Aziel Morgan, [phone].  ACTIVITY  You may resume your regular activity, but move at a slower pace for the next 24 hours.   Take frequent rest periods for the next 24 hours.   Walking will help get rid of the air and reduce the bloated feeling in your belly (abdomen).   No driving for 24 hours (because of the medicine (anesthesia) used during the test).   You may shower.   Do not sign any important legal documents or operate any machinery for 24 hours (because of the anesthesia used during the test).    NUTRITION  Drink plenty of fluids.   You may resume your normal diet as instructed by your doctor.   Begin with a light meal and progress to your normal diet. Heavy or fried foods are harder to digest and may make you feel sick to your stomach (nauseated).   Avoid alcoholic beverages for 24 hours or as instructed.    MEDICATIONS  You may resume your normal medications.   WHAT YOU CAN EXPECT TODAY  Some feelings of bloating in the abdomen.   Passage of more gas than usual.    IF YOU HAD A BIOPSY TAKEN DURING THE UPPER ENDOSCOPY:  Eat a soft diet IF YOU  HAVE NAUSEA, BLOATING, ABDOMINAL PAIN, OR VOMITING.    FINDING OUT THE RESULTS OF YOUR TEST Not all test results are available during your visit. DR. [name] WILL CALL YOU WITHIN 14 DAYS OF YOUR PROCEDUE WITH YOUR RESULTS. Do not assume everything is normal if you have not heard from DR. Nohely Whitehorn, CALL HER OFFICE AT [phone].  SEEK IMMEDIATE MEDICAL ATTENTION AND CALL THE OFFICE: [phone] IF:  You have more than a spotting of blood in your stool.   Your belly is swollen (abdominal distention).   You are nauseated or vomiting.   You have a temperature over 101F.   You have abdominal pain or discomfort that is severe or gets worse throughout the day.  Gastritis  Gastritis is an inflammation (the body's way of reacting to injury and/or infection) of the stomach. It is often caused by viral or bacterial (germ) infections. It can also be caused BY ASPIRIN, BC/GOODY POWDER'S, (IBUPROFEN) MOTRIN, OR ALEVE (NAPROXEN), chemicals (including alcohol), SPICY FOODS, and medications. This illness may be associated with generalized malaise (feeling tired, not well), UPPER ABDOMINAL STOMACH cramps, and fever. One common bacterial cause of gastritis is an organism known as H. Pylori. This can be treated with antibiotics.   ESOPHAGEAL STRICTURE  Esophageal strictures can be caused by stomach acid backing up into the tube that carries food from the mouth down to the stomach (lower esophagus).  TREATMENT There  are a number of medicines used to treat reflux/stricture, including: Antacids.  Proton-pump inhibitors: OMEPRAZOLE  HOME CARE INSTRUCTIONS Eat 2-3 hours before going to bed.  Try to reach and maintain a healthy weight.  Do not eat just a few very large meals. Instead, eat 4 TO 6 smaller meals throughout the day.  Try to identify foods and beverages that make your symptoms worse, and avoid these.  Avoid tight clothing.  Do not exercise right after eating.

## 2014-12-26 ENCOUNTER — Telehealth (HOSPITAL_COMMUNITY): Payer: Self-pay | Admitting: *Deleted

## 2014-12-26 NOTE — Telephone Encounter (Signed)
PATIENT SCHEDULED 01/2015

## 2014-12-29 ENCOUNTER — Other Ambulatory Visit (HOSPITAL_COMMUNITY): Payer: Self-pay | Admitting: Oncology

## 2014-12-29 DIAGNOSIS — C3411 Malignant neoplasm of upper lobe, right bronchus or lung: Secondary | ICD-10-CM

## 2014-12-29 MED ORDER — DEXAMETHASONE 4 MG PO TABS
ORAL_TABLET | ORAL | Status: DC
Start: 2014-12-29 — End: 2015-02-25

## 2014-12-29 MED ORDER — OXYCODONE HCL 10 MG PO TABS: 10.0000 mg | ORAL_TABLET | Freq: Four times a day (QID) | ORAL | Status: DC | PRN

## 2014-12-29 NOTE — Telephone Encounter (Signed)
Refill requested for Decadron.  Takes pre and post chemotherapy.

## 2014-12-29 NOTE — Telephone Encounter (Signed)
Informed pt and her husband. Mailing a copy of this info to them.

## 2015-01-14 ENCOUNTER — Ambulatory Visit (HOSPITAL_COMMUNITY): Payer: Medicaid Other | Admitting: Hematology & Oncology

## 2015-01-14 ENCOUNTER — Encounter (HOSPITAL_BASED_OUTPATIENT_CLINIC_OR_DEPARTMENT_OTHER): Payer: Medicaid Other

## 2015-01-14 ENCOUNTER — Encounter: Payer: Self-pay | Admitting: *Deleted

## 2015-01-14 ENCOUNTER — Encounter (HOSPITAL_BASED_OUTPATIENT_CLINIC_OR_DEPARTMENT_OTHER): Payer: Medicaid Other | Admitting: Hematology & Oncology

## 2015-01-14 ENCOUNTER — Encounter (HOSPITAL_COMMUNITY): Payer: Self-pay | Admitting: Hematology & Oncology

## 2015-01-14 VITALS — BP 118/61 | HR 90 | Temp 97.9°F | Resp 18

## 2015-01-14 VITALS — BP 132/79 | HR 95 | Temp 97.9°F | Resp 16 | Wt 81.0 lb

## 2015-01-14 DIAGNOSIS — C7972 Secondary malignant neoplasm of left adrenal gland: Secondary | ICD-10-CM

## 2015-01-14 DIAGNOSIS — C772 Secondary and unspecified malignant neoplasm of intra-abdominal lymph nodes: Secondary | ICD-10-CM

## 2015-01-14 DIAGNOSIS — D630 Anemia in neoplastic disease: Secondary | ICD-10-CM

## 2015-01-14 DIAGNOSIS — C7971 Secondary malignant neoplasm of right adrenal gland: Secondary | ICD-10-CM

## 2015-01-14 DIAGNOSIS — C3411 Malignant neoplasm of upper lobe, right bronchus or lung: Secondary | ICD-10-CM

## 2015-01-14 DIAGNOSIS — Z5111 Encounter for antineoplastic chemotherapy: Secondary | ICD-10-CM

## 2015-01-14 LAB — COMPREHENSIVE METABOLIC PANEL
ALBUMIN: 3 g/dL — AB (ref 3.5–5.2)
ALT: 35 U/L (ref 0–35)
AST: 25 U/L (ref 0–37)
Alkaline Phosphatase: 126 U/L — ABNORMAL HIGH (ref 39–117)
Anion gap: 7 (ref 5–15)
BUN: 18 mg/dL (ref 6–23)
CO2: 22 mmol/L (ref 19–32)
CREATININE: 1.12 mg/dL — AB (ref 0.50–1.10)
Calcium: 9.5 mg/dL (ref 8.4–10.5)
Chloride: 100 mmol/L (ref 96–112)
GFR calc Af Amer: 62 mL/min — ABNORMAL LOW (ref >90)
GFR, EST NON AFRICAN AMERICAN: 53 mL/min — AB (ref >90)
Glucose, Bld: 167 mg/dL — ABNORMAL HIGH (ref 70–99)
Potassium: 4.5 mmol/L (ref 3.5–5.1)
Sodium: 129 mmol/L — ABNORMAL LOW (ref 135–145)
Total Bilirubin: 0.2 mg/dL — ABNORMAL LOW (ref 0.3–1.2)
Total Protein: 7.8 g/dL (ref 6.0–8.3)

## 2015-01-14 LAB — CBC WITH DIFFERENTIAL/PLATELET
BASOS ABS: 0 10*3/uL (ref 0.0–0.1)
BASOS PCT: 0 % (ref 0–1)
EOS ABS: 0 10*3/uL (ref 0.0–0.7)
Eosinophils Relative: 0 % (ref 0–5)
HCT: 24.2 % — ABNORMAL LOW (ref 36.0–46.0)
Hemoglobin: 7.7 g/dL — ABNORMAL LOW (ref 12.0–15.0)
Lymphocytes Relative: 6 % — ABNORMAL LOW (ref 12–46)
Lymphs Abs: 0.6 10*3/uL — ABNORMAL LOW (ref 0.7–4.0)
MCH: 31.4 pg (ref 26.0–34.0)
MCHC: 31.8 g/dL (ref 30.0–36.0)
MCV: 98.8 fL (ref 78.0–100.0)
MONO ABS: 0.4 10*3/uL (ref 0.1–1.0)
Monocytes Relative: 4 % (ref 3–12)
Neutro Abs: 9.8 10*3/uL — ABNORMAL HIGH (ref 1.7–7.7)
Neutrophils Relative %: 90 % — ABNORMAL HIGH (ref 43–77)
Platelets: 458 10*3/uL — ABNORMAL HIGH (ref 150–400)
RBC: 2.45 MIL/uL — ABNORMAL LOW (ref 3.87–5.11)
RDW: 17.9 % — AB (ref 11.5–15.5)
WBC: 10.8 10*3/uL — ABNORMAL HIGH (ref 4.0–10.5)

## 2015-01-14 LAB — URINALYSIS, DIPSTICK ONLY
BILIRUBIN URINE: NEGATIVE
Glucose, UA: NEGATIVE mg/dL
Hgb urine dipstick: NEGATIVE
KETONES UR: NEGATIVE mg/dL
Leukocytes, UA: NEGATIVE
NITRITE: NEGATIVE
PROTEIN: 30 mg/dL — AB
Specific Gravity, Urine: <1.005 — ABNORMAL LOW (ref 1.005–1.030)
Urobilinogen, UA: 0.2 mg/dL (ref 0.0–1.0)
pH: 6 (ref 5.0–8.0)

## 2015-01-14 LAB — ABO/RH: ABO/RH(D): O POS

## 2015-01-14 LAB — PREPARE RBC (CROSSMATCH)

## 2015-01-14 MED ORDER — HEPARIN SOD (PORK) LOCK FLUSH 100 UNIT/ML IV SOLN
500.0000 [IU] | Freq: Once | INTRAVENOUS | Status: AC | PRN
  Administered 2015-01-14: 500 [IU]

## 2015-01-14 MED ORDER — BEVACIZUMAB CHEMO INJECTION 400 MG/16ML: 15.0000 mg/kg | Freq: Once | INTRAVENOUS | Status: DC

## 2015-01-14 MED ORDER — SODIUM CHLORIDE 0.9 % IV SOLN: 8.0000 mg | Freq: Once | INTRAVENOUS | Status: DC

## 2015-01-14 MED ORDER — HEPARIN SOD (PORK) LOCK FLUSH 100 UNIT/ML IV SOLN
INTRAVENOUS | Status: AC
  Filled 2015-01-14: qty 5

## 2015-01-14 MED ORDER — ONDANSETRON HCL 40 MG/20ML IJ SOLN
Freq: Once | INTRAMUSCULAR | Status: AC
  Administered 2015-01-14: 8 mg via INTRAVENOUS
  Filled 2015-01-14: qty 4

## 2015-01-14 MED ORDER — SODIUM CHLORIDE 0.9 % IV SOLN
Freq: Once | INTRAVENOUS | Status: AC
  Administered 2015-01-14: 12:00:00 via INTRAVENOUS

## 2015-01-14 MED ORDER — SODIUM CHLORIDE 0.9 % IJ SOLN: 10.0000 mL | INTRAMUSCULAR | Status: DC | PRN

## 2015-01-14 MED ORDER — POTASSIUM CHLORIDE CRYS ER 20 MEQ PO TBCR: 20.0000 meq | EXTENDED_RELEASE_TABLET | Freq: Two times a day (BID) | ORAL | Status: DC

## 2015-01-14 MED ORDER — DEXAMETHASONE SODIUM PHOSPHATE 10 MG/ML IJ SOLN: 10.0000 mg | Freq: Once | INTRAMUSCULAR | Status: DC

## 2015-01-14 MED ORDER — SODIUM CHLORIDE 0.9 % IV SOLN
500.0000 mg/m2 | Freq: Once | INTRAVENOUS | Status: AC
  Administered 2015-01-14: 600 mg via INTRAVENOUS
  Filled 2015-01-14: qty 24

## 2015-01-14 NOTE — Progress Notes (Signed)
[facility] Clinical Social Work  Clinical Social Work was referred by [place] rounding for assessment of psychosocial needs due to previous CSW involvement.  Clinical Social Worker met with patient at [facility] to offer support and assess for needs.  Pt was in good spirits when meeting with CSW. She was very excited to share that she had gained 7 pounds. CSW provided pt with more coupons for Boost. Pt reports she did receive gas card from the [place] last month. This was a huge help to her. Pt would like another one soon, but can only get one every three months and it is too soon currently. Pt's daughter is at home during the day with pt more and this seems to be helping as well. CSW inquired if CNA services are needed and explained how medicaid would cover that service. They decline that currently, but may be open to it in the future.  Pt learned she will have to get blood and she was tearful upon learning this. CSW provided support and MD to talk with pt as well.  Pt and daughter agree to reach out as needed.   Clinical Social Work interventions: Supportive listening Resource education  [name], [place] Tuesdays 8:30-1pm Wednesdays 8:30-12pm  [phone]

## 2015-01-14 NOTE — Progress Notes (Signed)
Dr Whitney Muse notified of hemoglobin 7.7.  OK to transfuse altima.  Hold avastin and zometa bc possible tooth extraction.  Will transfuse 2 units of blood tomorrow  Leonard Downing Tolerated chemotherapy discharged ambulatory

## 2015-01-14 NOTE — Progress Notes (Signed)
[name], stage IV, bilateral adrenal metastases, retroperitoneal lymph nodes  Biopsy on 08/01/2014 c/w poorly differentiated adenocarcinoma  XRT completed on 10/27/2014  Right Brachial plexus neuralgia  EGFR mutation negative ALK mutation negative  Extensive soft tissue thickening in the posterior nasopharynx with bilateral level IIa lymphadenopathy in the neck. The possibility of a second primary neoplasm in this region warrants consideration. Clinical correlation is recommended.  CURRENT THERAPY: Cisplatin/Alimta/Avastin  INTERVAL HISTORY: Melissa Sullivan 59 y.o. female returns for follow-up of stage IV adenocarcinoma of the lung.She is here today to receive Cycle #2 of Alimta and Avastin.  She needs to have a tooth pulled, it is affecting her ability to eat. She has received one dose of Zometa, last given on 2/3.   She has no other complaints. She is very active.  Her weight is up several pounds.  She has an appetite and states that food tastes good. She denies any new pain.   MEDICAL HISTORY: Past Medical History  Diagnosis Date  . Hypertension   . Reflux   . Hyperlipidemia   . Lung mass 07/28/2014  . Adrenal mass, right 07/28/2014  . Diabetes mellitus without complication     has Adrenal mass, right; Cancer of upper lobe of right lung; ARF (acute renal failure); Abnormal finding on imaging; Oropharyngeal dysphagia; Nausea and vomiting; Hypercalcemia; and Anemia in neoplastic disease on her problem list.      Cancer of upper lobe of right lung   07/28/2014 Imaging CT chest: Large R apical mass consistent with malignancy. This is destroying the R 2nd rib with extension into adjacent soft tissue. R hilar adenopathy with R 5cm adrenal metastatic lesion.   08/01/2014 Initial Biopsy Lung, needle/core biopsy(ies), right upper lobe - POORLY DIFFERENTIATED ADENOCARCINOMA, SEE COMMENT.   08/08/2014 PET scan Large hypermetabolic R apical mass with evidence of direct chest wall  and mediastinal invasion, right retrocrural lymphadenopathy, extensive retroperitoneal lymphadenopathy, and metastatic lesions to the adrenal glands    09/02/2014 -  Chemotherapy Cisplatin/Pemetrexed/Avastin every 21 days   10/07/2014 - 10/27/2014 Radiation Therapy Right lung apex for control of brachioplexopathy.     has No Known Allergies.  Ms. Gathright does not currently have medications on file.  SURGICAL HISTORY: Past Surgical History  Procedure Laterality Date  . Appendectomy    . Lung biopsy Right 07/2014    CT guided  . Portacath placement Left 09/01/2014  . Esophagogastroduodenoscopy N/A 12/09/2014    Procedure: ESOPHAGOGASTRODUODENOSCOPY (EGD);  Surgeon: [name], MD;  [facility];  Service: Endoscopy;  Laterality: N/A;  1215  . Savory dilation N/A 12/09/2014    Procedure: SAVORY DILATION;  Surgeon: [name], MD;  [facility];  Service: Endoscopy;  Laterality: N/A;  [name] dilation N/A 12/09/2014    Procedure: [name] DILATION;  Surgeon: [name], MD;  [facility];  Service: Endoscopy;  Laterality: N/A;    SOCIAL HISTORY: History   Social History  . Marital Status: Married    Spouse Name: N/A  . Number of Children: N/A  . Years of Education: N/A   Occupational History  . Not on file.   Social History Main Topics  . Smoking status: Light Tobacco Smoker -- 0.30 packs/day    Types: Cigarettes  . Smokeless tobacco: Never Used  . Alcohol Use: No  . Drug Use: No  . Sexual Activity: Not on file   Other Topics Concern  . Not on file  Social History Narrative    FAMILY HISTORY: Family History  Problem Relation Age of Onset  . Cancer Sister     Review of Systems  Constitutional: Negative for fever, chills, weight loss and malaise/fatigue.  HENT: Negative for congestion, hearing loss, nosebleeds, sore throat and tinnitus.        Tooth pain  Eyes: Negative for blurred vision, double vision, pain and  discharge.  Respiratory: Negative for cough, hemoptysis, sputum production, shortness of breath and wheezing.   Cardiovascular: Negative for chest pain, palpitations, claudication, leg swelling and PND.  Gastrointestinal: Negative for heartburn, nausea, vomiting, abdominal pain, diarrhea, constipation, blood in stool and melena.  Genitourinary: Negative for dysuria, urgency, frequency and hematuria.  Musculoskeletal: Negative for myalgias, joint pain and falls.  Skin: Negative for itching and rash.  Neurological: Negative for dizziness, tingling, tremors, sensory change, speech change, focal weakness, seizures, loss of consciousness, weakness and headaches.  Endo/Heme/Allergies: Does not bruise/bleed easily.  Psychiatric/Behavioral: Negative for depression, suicidal ideas, memory loss and substance abuse. The patient is not nervous/anxious and does not have insomnia.     PHYSICAL EXAMINATION  ECOG PERFORMANCE STATUS: 1 - Symptomatic but completely ambulatory  Filed Vitals:   01/14/15 0933  BP: 132/79  Pulse: 95  Temp: 97.9 F (36.6 C)  Resp: 16    Physical Exam  Constitutional: She is oriented to person, place, and time.  HENT:  Head: Normocephalic and atraumatic.  Nose: Nose normal.  Mouth/Throat: Oropharynx is clear and moist. No oropharyngeal exudate.  Eyes: Conjunctivae and EOM are normal. Pupils are equal, round, and reactive to light. Right eye exhibits no discharge. Left eye exhibits no discharge. No scleral icterus.  Neck: Normal range of motion. Neck supple. No tracheal deviation present. No thyromegaly present.  Cardiovascular: Normal rate, regular rhythm and normal heart sounds.  Exam reveals no gallop and no friction rub.   No murmur heard. Pulmonary/Chest: Effort normal and breath sounds normal. She has no wheezes. She has no rales.  Abdominal: Soft. Bowel sounds are normal. She exhibits no distension and no mass. There is no tenderness. There is no rebound and no  guarding.  Thin  Musculoskeletal: Normal range of motion. She exhibits no edema.  Lymphadenopathy:    She has no cervical adenopathy.  Neurological: She is alert and oriented to person, place, and time. She has normal reflexes. No cranial nerve deficit. Gait normal. Coordination normal.  Skin: Skin is warm and dry. No rash noted.  Psychiatric: Mood, memory, affect and judgment normal.  Nursing note and vitals reviewed.   LABORATORY DATA: CBC    Component Value Date/Time   WBC 10.8* 01/14/2015 0940   RBC 2.45* 01/14/2015 0940   HGB 7.7* 01/14/2015 0940   HCT 24.2* 01/14/2015 0940   PLT 458* 01/14/2015 0940   MCV 98.8 01/14/2015 0940   MCH 31.4 01/14/2015 0940   MCHC 31.8 01/14/2015 0940   RDW 17.9* 01/14/2015 0940   LYMPHSABS 0.6* 01/14/2015 0940   MONOABS 0.4 01/14/2015 0940   EOSABS 0.0 01/14/2015 0940   BASOSABS 0.0 01/14/2015 0940   CMP     Component Value Date/Time   NA 129* 01/14/2015 0940   K 4.5 01/14/2015 0940   CL 100 01/14/2015 0940   CO2 22 01/14/2015 0940   GLUCOSE 167* 01/14/2015 0940   BUN 18 01/14/2015 0940   CREATININE 1.12* 01/14/2015 0940   CALCIUM 9.5 01/14/2015 0940   PROT 7.8 01/14/2015 0940   ALBUMIN 3.0* 01/14/2015 0940   AST 25  01/14/2015 0940   ALT 35 01/14/2015 0940   ALKPHOS 126* 01/14/2015 0940   BILITOT 0.2* 01/14/2015 0940   GFRNONAA 67* 01/14/2015 0940   GFRAA 54* 01/14/2015 0940    ASSESSMENT and THERAPY PLAN:    Cancer of upper lobe of right lung Pleasant 59 year old female with stage IV adenocarcinoma of the lung. Currently on maintenance Alimta/Avastin and zometa.  She is doing very well. Her weight is slowly improving. Her mood is excellent.  I would hold Avastin today given that she wishes to see a dentist about her tooth.  I have again re-addressed her recent zometa treatment and provided her with the name of the medication on her AVS to show her dentist.  I have also advised her to have them call me if needed.  We will  proceed with Alimta today and see her back in 3 weeks for her next treatment. As always, she was advised to call with any problems prior to f/u. Zometa will be held moving forward until her dental issues are resolved.   Anemia in neoplastic disease She has required a blood transfusion on several occasions, I suspect her anemia is most likely chemotherapy induced and anemia of chronic disease from her malignancy. I have added folate, iron studies to her blood work. I have also discussed with her the addition of ESA's moving forward.  We will re address this at f/u.    All questions were answered. The patient knows to call the clinic with any problems, questions or concerns. We can certainly see the patient much sooner if necessary.  Molli Hazard 01/14/2015

## 2015-01-14 NOTE — Patient Instructions (Addendum)
[facility] Discharge Instructions for Patients Receiving Chemotherapy  Today you received the following chemotherapy agents:  Alimta and Avastin Please let your dentist know prior to having your teeth pulled that you received Zometa IV on December 24, 2014. Call the clinic to let us know the day of your dental appointment so we can schedule you for blood work the day before. Return tomorrow as scheduled for blood transfusion. Return in 3 weeks for chemotherapy and office visit.  If you develop nausea and vomiting, or diarrhea that is not controlled by your medication, call the clinic.  The clinic phone number is [phone]. Office hours are Monday-Friday 8:30am-5:00pm.  BELOW ARE SYMPTOMS THAT SHOULD BE REPORTED IMMEDIATELY:  *FEVER GREATER THAN 101.0 F  *CHILLS WITH OR WITHOUT FEVER  NAUSEA AND VOMITING THAT IS NOT CONTROLLED WITH YOUR NAUSEA MEDICATION  *UNUSUAL SHORTNESS OF BREATH  *UNUSUAL BRUISING OR BLEEDING  TENDERNESS IN MOUTH AND THROAT WITH OR WITHOUT PRESENCE OF ULCERS  *URINARY PROBLEMS  *BOWEL PROBLEMS  UNUSUAL RASH Items with * indicate a potential emergency and should be followed up as soon as possible. If you have an emergency after office hours please contact your primary care physician or go to the nearest emergency department.  Please call the clinic during office hours if you have any questions or concerns.   You may also contact the Patient Navigator at [phone] should you have any questions or need assistance in obtaining follow up care. _____________________________________________________________________ Have you asked about our STAR program?    STAR stands for Survivorship Training and Rehabilitation, and this is a nationally recognized cancer care program that focuses on survivorship and rehabilitation.  Cancer and cancer treatments may cause problems, such as, pain, making you feel tired and keeping you from doing the things  that you need or want to do. Cancer rehabilitation can help. Our goal is to reduce these troubling effects and help you have the best quality of life possible.  You may receive a survey from a nurse that asks questions about your current state of health.  Based on the survey results, all eligible patients will be referred to the [facility] program for an evaluation so we can better serve you! A frequently asked questions sheet is available upon request.

## 2015-01-14 NOTE — Assessment & Plan Note (Signed)
Pleasant 59 year old female with stage IV adenocarcinoma of the lung. Currently on maintenance Alimta/Avastin and zometa.  She is doing very well. Her weight is slowly improving. Her mood is excellent.  I would hold Avastin today given that she wishes to see a dentist about her tooth.  I have again re-addressed her recent zometa treatment and provided her with the name of the medication on her AVS to show her dentist.  I have also advised her to have them call me if needed.  We will proceed with Alimta today and see her back in 3 weeks for her next treatment. As always, she was advised to call with any problems prior to f/u. Zometa will be held moving forward until her dental issues are resolved.

## 2015-01-14 NOTE — Assessment & Plan Note (Signed)
She has required a blood transfusion on several occasions, I suspect her anemia is most likely chemotherapy induced and anemia of chronic disease from her malignancy. I have added folate, iron studies to her blood work. I have also discussed with her the addition of ESA's moving forward.  We will re address this at f/u.

## 2015-01-14 NOTE — Patient Instructions (Signed)
[facility] Discharge Instructions for Patients Receiving Chemotherapy  Today you received the following chemotherapy agents altima, please return tomorrow for 2 units of blood.  Call the clinic if you have any questions or concerns  To help prevent nausea and vomiting after your treatment, we encourage you to take your nausea medication   If you develop nausea and vomiting that is not controlled by your nausea medication, call the clinic. If it is after clinic hours your family physician or the after hours number for the clinic or go to the Emergency Department.   BELOW ARE SYMPTOMS THAT SHOULD BE REPORTED IMMEDIATELY:  *FEVER GREATER THAN 101.0 F  *CHILLS WITH OR WITHOUT FEVER  NAUSEA AND VOMITING THAT IS NOT CONTROLLED WITH YOUR NAUSEA MEDICATION  *UNUSUAL SHORTNESS OF BREATH  *UNUSUAL BRUISING OR BLEEDING  TENDERNESS IN MOUTH AND THROAT WITH OR WITHOUT PRESENCE OF ULCERS  *URINARY PROBLEMS  *BOWEL PROBLEMS  UNUSUAL RASH Items with * indicate a potential emergency and should be followed up as soon as possible.  One of the nurses will contact you 24 hours after your treatment. Please let the nurse know about any problems that you may have experienced. Feel free to call the clinic you have any questions or concerns. The clinic phone number is [phone].   I have been informed and understand all the instructions given to me. I know to contact the clinic, my physician, or go to the Emergency Department if any problems should occur. I do not have any questions at this time, but understand that I may call the clinic during office hours or the Patient Navigator at [phone] should I have any questions or need assistance in obtaining follow up care.

## 2015-01-15 ENCOUNTER — Encounter (HOSPITAL_BASED_OUTPATIENT_CLINIC_OR_DEPARTMENT_OTHER): Payer: Medicaid Other

## 2015-01-15 DIAGNOSIS — C3411 Malignant neoplasm of upper lobe, right bronchus or lung: Secondary | ICD-10-CM | POA: Diagnosis not present

## 2015-01-15 DIAGNOSIS — D630 Anemia in neoplastic disease: Secondary | ICD-10-CM

## 2015-01-15 LAB — RETICULOCYTES
RBC.: 2.45 MIL/uL — AB (ref 3.87–5.11)
RETIC COUNT ABSOLUTE: 83.3 10*3/uL (ref 19.0–186.0)
RETIC CT PCT: 3.4 % — AB (ref 0.4–3.1)

## 2015-01-15 LAB — IRON AND TIBC
Iron: 155 ug/dL — ABNORMAL HIGH (ref 42–145)
SATURATION RATIOS: 68 % — AB (ref 20–55)
TIBC: 228 ug/dL — ABNORMAL LOW (ref 250–470)
UIBC: 73 ug/dL — ABNORMAL LOW (ref 125–400)

## 2015-01-15 LAB — FERRITIN: Ferritin: 800 ng/mL — ABNORMAL HIGH (ref 10–291)

## 2015-01-15 LAB — VITAMIN B12

## 2015-01-15 LAB — FOLATE: Folate: >20 ng/mL

## 2015-01-15 MED ORDER — SODIUM CHLORIDE 0.9 % IJ SOLN
10.0000 mL | INTRAMUSCULAR | Status: AC | PRN
  Administered 2015-01-15: 10 mL

## 2015-01-15 MED ORDER — ACETAMINOPHEN 325 MG PO TABS
ORAL_TABLET | ORAL | Status: AC
  Filled 2015-01-15: qty 2

## 2015-01-15 MED ORDER — HEPARIN SOD (PORK) LOCK FLUSH 100 UNIT/ML IV SOLN
500.0000 [IU] | Freq: Every day | INTRAVENOUS | Status: AC | PRN
  Administered 2015-01-15: 500 [IU]

## 2015-01-15 MED ORDER — HEPARIN SOD (PORK) LOCK FLUSH 100 UNIT/ML IV SOLN
INTRAVENOUS | Status: AC
  Filled 2015-01-15: qty 5

## 2015-01-15 MED ORDER — DIPHENHYDRAMINE HCL 25 MG PO CAPS
25.0000 mg | ORAL_CAPSULE | Freq: Once | ORAL | Status: AC
  Administered 2015-01-15: 25 mg via ORAL

## 2015-01-15 MED ORDER — DIPHENHYDRAMINE HCL 25 MG PO CAPS
ORAL_CAPSULE | ORAL | Status: AC
  Filled 2015-01-15: qty 1

## 2015-01-15 MED ORDER — SODIUM CHLORIDE 0.9 % IV SOLN
250.0000 mL | Freq: Once | INTRAVENOUS | Status: AC
  Administered 2015-01-15: 250 mL via INTRAVENOUS

## 2015-01-15 MED ORDER — ACETAMINOPHEN 325 MG PO TABS
650.0000 mg | ORAL_TABLET | Freq: Once | ORAL | Status: AC
  Administered 2015-01-15: 650 mg via ORAL

## 2015-01-15 NOTE — Patient Instructions (Signed)
[place] at [facility] Discharge Instructions  RECOMMENDATIONS MADE BY THE CONSULTANT AND ANY TEST RESULTS WILL BE SENT TO YOUR REFERRING PHYSICIAN.  Today you received 2 units blood transfusion. Return as scheduled.  Thank you for choosing [place] at [facility] to provide your oncology and hematology care.  To afford each patient quality time with our provider, please arrive at least 15 minutes before your scheduled appointment time.    You need to re-schedule your appointment should you arrive 10 or more minutes late.  We strive to give you quality time with our providers, and arriving late affects you and other patients whose appointments are after yours.  Also, if you no show three or more times for appointments you may be dismissed from the clinic at the providers discretion.     Again, thank you for choosing [facility].  Our hope is that these requests will decrease the amount of time that you wait before being seen by our physicians.       _____________________________________________________________  Should you have questions after your visit to [facility], please contact our office at [phone] between the hours of 8:30 a.m. and 4:30 p.m.  Voicemails left after 4:30 p.m. will not be returned until the following business day.  For prescription refill requests, have your pharmacy contact our office.

## 2015-01-16 LAB — TYPE AND SCREEN
ABO/RH(D): O POS
Antibody Screen: NEGATIVE
UNIT DIVISION: 0
Unit division: 0

## 2015-01-20 ENCOUNTER — Telehealth (HOSPITAL_COMMUNITY): Payer: Self-pay

## 2015-01-20 ENCOUNTER — Encounter: Payer: Self-pay | Admitting: *Deleted

## 2015-01-20 ENCOUNTER — Other Ambulatory Visit (HOSPITAL_COMMUNITY): Payer: Medicaid Other | Admitting: Dentistry

## 2015-01-20 NOTE — Progress Notes (Signed)
[facility] Clinical Social Work  Clinical Social Work phoned pt's daughter to inform her that last gas card through the [employer] was submitted 11/26/14. Cards can be requested once every three months. Daughter now aware next request would be 02/25/15. No other needs noted at this time.  Pt and daughter aware and agree to reach out as needed.   Clinical Social Work interventions:  Resource education    [name], [place] Tuesdays 8:30-1pm Wednesdays 8:30-12pm  [phone]

## 2015-01-20 NOTE — Telephone Encounter (Signed)
01/20/15                  Madelaine Etienne (daughter) of patient called and left msg. cancelling appt  for Dental Consult w/Dr. Enrique Sack scheduled on 01/20/15.  Patient will call back to reschedule at a later date.  LRI

## 2015-01-21 ENCOUNTER — Ambulatory Visit (HOSPITAL_COMMUNITY): Payer: Medicaid Other

## 2015-01-28 ENCOUNTER — Encounter (HOSPITAL_COMMUNITY): Payer: 59 | Attending: Hematology and Oncology

## 2015-01-28 DIAGNOSIS — K219 Gastro-esophageal reflux disease without esophagitis: Secondary | ICD-10-CM | POA: Diagnosis not present

## 2015-01-28 DIAGNOSIS — C7972 Secondary malignant neoplasm of left adrenal gland: Secondary | ICD-10-CM | POA: Insufficient documentation

## 2015-01-28 DIAGNOSIS — J449 Chronic obstructive pulmonary disease, unspecified: Secondary | ICD-10-CM | POA: Insufficient documentation

## 2015-01-28 DIAGNOSIS — C772 Secondary and unspecified malignant neoplasm of intra-abdominal lymph nodes: Secondary | ICD-10-CM | POA: Diagnosis not present

## 2015-01-28 DIAGNOSIS — C7971 Secondary malignant neoplasm of right adrenal gland: Secondary | ICD-10-CM | POA: Insufficient documentation

## 2015-01-28 DIAGNOSIS — F17210 Nicotine dependence, cigarettes, uncomplicated: Secondary | ICD-10-CM | POA: Insufficient documentation

## 2015-01-28 DIAGNOSIS — C3411 Malignant neoplasm of upper lobe, right bronchus or lung: Secondary | ICD-10-CM | POA: Diagnosis present

## 2015-01-28 DIAGNOSIS — C7989 Secondary malignant neoplasm of other specified sites: Secondary | ICD-10-CM | POA: Insufficient documentation

## 2015-01-28 DIAGNOSIS — C781 Secondary malignant neoplasm of mediastinum: Secondary | ICD-10-CM | POA: Insufficient documentation

## 2015-01-28 DIAGNOSIS — Z79899 Other long term (current) drug therapy: Secondary | ICD-10-CM | POA: Insufficient documentation

## 2015-01-28 DIAGNOSIS — Z9089 Acquired absence of other organs: Secondary | ICD-10-CM | POA: Diagnosis not present

## 2015-01-28 DIAGNOSIS — I10 Essential (primary) hypertension: Secondary | ICD-10-CM | POA: Diagnosis not present

## 2015-01-28 DIAGNOSIS — E785 Hyperlipidemia, unspecified: Secondary | ICD-10-CM | POA: Insufficient documentation

## 2015-01-28 LAB — CBC WITH DIFFERENTIAL/PLATELET
BASOS ABS: 0 10*3/uL (ref 0.0–0.1)
BASOS PCT: 0 % (ref 0–1)
Eosinophils Absolute: 0.1 10*3/uL (ref 0.0–0.7)
Eosinophils Relative: 1 % (ref 0–5)
HEMATOCRIT: 37.8 % (ref 36.0–46.0)
Hemoglobin: 12.5 g/dL (ref 12.0–15.0)
LYMPHS PCT: 25 % (ref 12–46)
Lymphs Abs: 1.4 10*3/uL (ref 0.7–4.0)
MCH: 30.6 pg (ref 26.0–34.0)
MCHC: 33.1 g/dL (ref 30.0–36.0)
MCV: 92.6 fL (ref 78.0–100.0)
Monocytes Absolute: 1.2 10*3/uL — ABNORMAL HIGH (ref 0.1–1.0)
Monocytes Relative: 22 % — ABNORMAL HIGH (ref 3–12)
NEUTROS ABS: 2.9 10*3/uL (ref 1.7–7.7)
NEUTROS PCT: 52 % (ref 43–77)
PLATELETS: 243 10*3/uL (ref 150–400)
RBC: 4.08 MIL/uL (ref 3.87–5.11)
RDW: 17.7 % — ABNORMAL HIGH (ref 11.5–15.5)
WBC: 5.6 10*3/uL (ref 4.0–10.5)

## 2015-01-28 NOTE — Progress Notes (Signed)
Labs for cbcd

## 2015-02-04 ENCOUNTER — Encounter (HOSPITAL_BASED_OUTPATIENT_CLINIC_OR_DEPARTMENT_OTHER): Payer: 59

## 2015-02-04 ENCOUNTER — Encounter (HOSPITAL_COMMUNITY): Payer: Self-pay | Admitting: Oncology

## 2015-02-04 ENCOUNTER — Ambulatory Visit: Payer: Medicaid Other | Admitting: Gastroenterology

## 2015-02-04 ENCOUNTER — Encounter (HOSPITAL_BASED_OUTPATIENT_CLINIC_OR_DEPARTMENT_OTHER): Payer: 59 | Admitting: Oncology

## 2015-02-04 VITALS — BP 126/67 | HR 71 | Temp 98.0°F | Resp 18

## 2015-02-04 VITALS — BP 126/84 | HR 88 | Temp 98.2°F | Resp 18 | Wt 82.0 lb

## 2015-02-04 DIAGNOSIS — C3411 Malignant neoplasm of upper lobe, right bronchus or lung: Secondary | ICD-10-CM | POA: Diagnosis not present

## 2015-02-04 DIAGNOSIS — D638 Anemia in other chronic diseases classified elsewhere: Secondary | ICD-10-CM

## 2015-02-04 DIAGNOSIS — D630 Anemia in neoplastic disease: Secondary | ICD-10-CM

## 2015-02-04 DIAGNOSIS — Z5111 Encounter for antineoplastic chemotherapy: Secondary | ICD-10-CM

## 2015-02-04 DIAGNOSIS — Z5112 Encounter for antineoplastic immunotherapy: Secondary | ICD-10-CM

## 2015-02-04 DIAGNOSIS — N179 Acute kidney failure, unspecified: Secondary | ICD-10-CM

## 2015-02-04 LAB — COMPREHENSIVE METABOLIC PANEL
ALK PHOS: 86 U/L (ref 39–117)
ALT: 17 U/L (ref 0–35)
ANION GAP: 7 (ref 5–15)
AST: 28 U/L (ref 0–37)
Albumin: 3.3 g/dL — ABNORMAL LOW (ref 3.5–5.2)
BILIRUBIN TOTAL: 0.3 mg/dL (ref 0.3–1.2)
BUN: 26 mg/dL — ABNORMAL HIGH (ref 6–23)
CO2: 25 mmol/L (ref 19–32)
Calcium: 10.8 mg/dL — ABNORMAL HIGH (ref 8.4–10.5)
Chloride: 101 mmol/L (ref 96–112)
Creatinine, Ser: 1.21 mg/dL — ABNORMAL HIGH (ref 0.50–1.10)
GFR calc Af Amer: 56 mL/min — ABNORMAL LOW (ref >90)
GFR calc non Af Amer: 48 mL/min — ABNORMAL LOW (ref >90)
Glucose, Bld: 106 mg/dL — ABNORMAL HIGH (ref 70–99)
POTASSIUM: 4.8 mmol/L (ref 3.5–5.1)
SODIUM: 133 mmol/L — AB (ref 135–145)
Total Protein: 8.3 g/dL (ref 6.0–8.3)

## 2015-02-04 LAB — CBC WITH DIFFERENTIAL/PLATELET
Basophils Absolute: 0 10*3/uL (ref 0.0–0.1)
Basophils Relative: 0 % (ref 0–1)
EOS ABS: 0 10*3/uL (ref 0.0–0.7)
Eosinophils Relative: 0 % (ref 0–5)
HCT: 33.5 % — ABNORMAL LOW (ref 36.0–46.0)
HEMOGLOBIN: 10.8 g/dL — AB (ref 12.0–15.0)
LYMPHS ABS: 0.9 10*3/uL (ref 0.7–4.0)
LYMPHS PCT: 5 % — AB (ref 12–46)
MCH: 30.2 pg (ref 26.0–34.0)
MCHC: 32.2 g/dL (ref 30.0–36.0)
MCV: 93.6 fL (ref 78.0–100.0)
Monocytes Absolute: 1.2 10*3/uL — ABNORMAL HIGH (ref 0.1–1.0)
Monocytes Relative: 6 % (ref 3–12)
Neutro Abs: 16.6 10*3/uL — ABNORMAL HIGH (ref 1.7–7.7)
Neutrophils Relative %: 89 % — ABNORMAL HIGH (ref 43–77)
PLATELETS: 397 10*3/uL (ref 150–400)
RBC: 3.58 MIL/uL — AB (ref 3.87–5.11)
RDW: 18 % — ABNORMAL HIGH (ref 11.5–15.5)
WBC: 18.8 10*3/uL — AB (ref 4.0–10.5)

## 2015-02-04 LAB — URINALYSIS, DIPSTICK ONLY
Bilirubin Urine: NEGATIVE
GLUCOSE, UA: NEGATIVE mg/dL
HGB URINE DIPSTICK: NEGATIVE
KETONES UR: NEGATIVE mg/dL
LEUKOCYTES UA: NEGATIVE
Nitrite: NEGATIVE
Protein, ur: 100 mg/dL — AB
Specific Gravity, Urine: 1.015 (ref 1.005–1.030)
Urobilinogen, UA: 0.2 mg/dL (ref 0.0–1.0)
pH: 6.5 (ref 5.0–8.0)

## 2015-02-04 MED ORDER — SODIUM CHLORIDE 0.9 % IV SOLN
Freq: Once | INTRAVENOUS | Status: AC
  Administered 2015-02-04: 12:00:00 via INTRAVENOUS

## 2015-02-04 MED ORDER — CYANOCOBALAMIN 1000 MCG/ML IJ SOLN
1000.0000 ug | Freq: Once | INTRAMUSCULAR | Status: AC
  Administered 2015-02-04: 1000 ug via INTRAMUSCULAR
  Filled 2015-02-04: qty 1

## 2015-02-04 MED ORDER — DARBEPOETIN ALFA 500 MCG/ML IJ SOSY
500.0000 ug | PREFILLED_SYRINGE | Freq: Once | INTRAMUSCULAR | Status: AC
  Administered 2015-02-04: 500 ug via SUBCUTANEOUS

## 2015-02-04 MED ORDER — DEXAMETHASONE SODIUM PHOSPHATE 10 MG/ML IJ SOLN: 10.0000 mg | Freq: Once | INTRAMUSCULAR | Status: DC

## 2015-02-04 MED ORDER — DARBEPOETIN ALFA 500 MCG/ML IJ SOSY
PREFILLED_SYRINGE | INTRAMUSCULAR | Status: AC
  Filled 2015-02-04: qty 1

## 2015-02-04 MED ORDER — SODIUM CHLORIDE 0.9 % IV SOLN
500.0000 mg/m2 | Freq: Once | INTRAVENOUS | Status: AC
  Administered 2015-02-04: 600 mg via INTRAVENOUS
  Filled 2015-02-04: qty 20

## 2015-02-04 MED ORDER — SODIUM CHLORIDE 0.9 % IJ SOLN
10.0000 mL | INTRAMUSCULAR | Status: DC | PRN
Start: 2015-02-04 — End: 2015-02-04
  Administered 2015-02-04: 10 mL
  Filled 2015-02-04: qty 10

## 2015-02-04 MED ORDER — SODIUM CHLORIDE 0.9 % IV SOLN
Freq: Once | INTRAVENOUS | Status: AC
  Administered 2015-02-04: 8 mg via INTRAVENOUS
  Filled 2015-02-04: qty 4

## 2015-02-04 MED ORDER — SODIUM CHLORIDE 0.9 % IV SOLN
15.0000 mg/kg | Freq: Once | INTRAVENOUS | Status: AC
  Administered 2015-02-04: 550 mg via INTRAVENOUS
  Filled 2015-02-04: qty 6

## 2015-02-04 MED ORDER — HEPARIN SOD (PORK) LOCK FLUSH 100 UNIT/ML IV SOLN
500.0000 [IU] | Freq: Once | INTRAVENOUS | Status: AC | PRN
  Administered 2015-02-04: 500 [IU]
  Filled 2015-02-04: qty 5

## 2015-02-04 MED ORDER — SODIUM CHLORIDE 0.9 % IV SOLN: 15.0000 mg/kg | Freq: Once | INTRAVENOUS | Status: DC

## 2015-02-04 MED ORDER — SODIUM CHLORIDE 0.9 % IV SOLN: 8.0000 mg | Freq: Once | INTRAVENOUS | Status: DC

## 2015-02-04 NOTE — Patient Instructions (Signed)
..  [place] at [facility] Discharge Instructions  RECOMMENDATIONS MADE BY THE CONSULTANT AND ANY TEST RESULTS WILL BE SENT TO YOUR REFERRING PHYSICIAN.  CT scan in 2-3 weeks  Return in  3 weeks for MD to review scans and for chemo  Call us if needed in meantime  Thank you for choosing [place] at [facility] to provide your oncology and hematology care.  To afford each patient quality time with our provider, please arrive at least 15 minutes before your scheduled appointment time.    You need to re-schedule your appointment should you arrive 10 or more minutes late.  We strive to give you quality time with our providers, and arriving late affects you and other patients whose appointments are after yours.  Also, if you no show three or more times for appointments you may be dismissed from the clinic at the providers discretion.     Again, thank you for choosing [facility].  Our hope is that these requests will decrease the amount of time that you wait before being seen by our physicians.       _____________________________________________________________  Should you have questions after your visit to [facility], please contact our office at [phone] between the hours of 8:30 a.m. and 4:30 p.m.  Voicemails left after 4:30 p.m. will not be returned until the following business day.  For prescription refill requests, have your pharmacy contact our office.

## 2015-02-04 NOTE — Progress Notes (Signed)
Melissa Sullivan presents today for injection per MD orders. B12 1000 mcg administered IM in left Upper Arm. Administration without incident. Patient tolerated well. Melissa Sullivan presents today for injection per MD orders. Aranesp 500 mcg administered SQ in right Abdomen. Administration without incident. Patient tolerated well.  Tolerated chemo well.

## 2015-02-04 NOTE — Patient Instructions (Signed)
[facility] Discharge Instructions for Patients Receiving Chemotherapy  Today you received the following chemotherapy agents Alimta. You also received a B12 injection and an Aranesp injection. We need for you to do a 24 hour urine collection. Instructions provided. To help prevent nausea and vomiting after your treatment, we encourage you to take your nausea medication as instructed. If you develop nausea and vomiting that is not controlled by your nausea medication, call the clinic. If it is after clinic hours your family physician or the after hours number for the clinic or go to the Emergency Department. BELOW ARE SYMPTOMS THAT SHOULD BE REPORTED IMMEDIATELY:  *FEVER GREATER THAN 101.0 F  *CHILLS WITH OR WITHOUT FEVER  NAUSEA AND VOMITING THAT IS NOT CONTROLLED WITH YOUR NAUSEA MEDICATION  *UNUSUAL SHORTNESS OF BREATH  *UNUSUAL BRUISING OR BLEEDING  TENDERNESS IN MOUTH AND THROAT WITH OR WITHOUT PRESENCE OF ULCERS  *URINARY PROBLEMS  *BOWEL PROBLEMS  UNUSUAL RASH Items with * indicate a potential emergency and should be followed up as soon as possible.  24-Hour Urine Collection HOME CARE  When you get up in the morning on the day you do this test, pee (urinate) in the toilet and flush. Make a note of the time. This will be your start time on the day of collection and the end time on the next morning.  From then on, save all your pee (urine) in the plastic jug that was given to you.  You should stop collecting your pee 24 hours after you started.  If the plastic jug that is given to you already has liquid in it, that is okay. Do not throw out the liquid or rinse out the jug. Some tests need the liquid to be added to your pee.  Keep your plastic jug cool (in an ice chest or the refrigerator) during the test.  When the 24 hours is over, bring your plastic jug to the clinic lab. Keep the jug cool (in an ice chest) while you are bringing it to the lab. Document  Released: 02/03/2009 Document Revised: 01/30/2012 Document Reviewed: 02/03/2009 [facility] Patient Information 2015 [employer], [place]. This information is not intended to replace advice given to you by your health care provider. Make sure you discuss any questions you have with your health care provider.  Darbepoetin Alfa injection What is this medicine? DARBEPOETIN ALFA (dar be POE e tin AL fa) helps your body make more red blood cells. It is used to treat anemia caused by chronic kidney failure and chemotherapy. This medicine may be used for other purposes; ask your health care provider or pharmacist if you have questions. COMMON BRAND NAME(S): Aranesp What should I tell my health care provider before I take this medicine? They need to know if you have any of these conditions: -blood clotting disorders or history of blood clots -cancer patient not on chemotherapy -cystic fibrosis -heart disease, such as angina, heart failure, or a history of a heart attack -hemoglobin level of 12 g/dL or greater -high blood pressure -low levels of folate, iron, or vitamin B12 -seizures -an unusual or allergic reaction to darbepoetin, erythropoietin, albumin, hamster proteins, latex, other medicines, foods, dyes, or preservatives -pregnant or trying to get pregnant -breast-feeding How should I use this medicine? This medicine is for injection into a vein or under the skin. It is usually given by a health care professional in a hospital or clinic setting. If you get this medicine at home, you will be taught how to prepare  and give this medicine. Do not shake the solution before you withdraw a dose. Use exactly as directed. Take your medicine at regular intervals. Do not take your medicine more often than directed. It is important that you put your used needles and syringes in a special sharps container. Do not put them in a trash can. If you do not have a sharps container, call your pharmacist or healthcare  provider to get one. Talk to your pediatrician regarding the use of this medicine in children. While this medicine may be used in children as young as 1 year for selected conditions, precautions do apply. Overdosage: If you think you have taken too much of this medicine contact a poison control center or emergency room at once. NOTE: This medicine is only for you. Do not share this medicine with others. What if I miss a dose? If you miss a dose, take it as soon as you can. If it is almost time for your next dose, take only that dose. Do not take double or extra doses. What may interact with this medicine? Do not take this medicine with any of the following medications: -epoetin alfa This list may not describe all possible interactions. Give your health care provider a list of all the medicines, herbs, non-prescription drugs, or dietary supplements you use. Also tell them if you smoke, drink alcohol, or use illegal drugs. Some items may interact with your medicine. What should I watch for while using this medicine? Visit your prescriber or health care professional for regular checks on your progress and for the needed blood tests and blood pressure measurements. It is especially important for the doctor to make sure your hemoglobin level is in the desired range, to limit the risk of potential side effects and to give you the best benefit. Keep all appointments for any recommended tests. Check your blood pressure as directed. Ask your doctor what your blood pressure should be and when you should contact him or her. As your body makes more red blood cells, you may need to take iron, folic acid, or vitamin B supplements. Ask your doctor or health care provider which products are right for you. If you have kidney disease continue dietary restrictions, even though this medication can make you feel better. Talk with your doctor or health care professional about the foods you eat and the vitamins that you  take. What side effects may I notice from receiving this medicine? Side effects that you should report to your doctor or health care professional as soon as possible: -allergic reactions like skin rash, itching or hives, swelling of the face, lips, or tongue -breathing problems -changes in vision -chest pain -confusion, trouble speaking or understanding -feeling faint or lightheaded, falls -high blood pressure -muscle aches or pains -pain, swelling, warmth in the leg -rapid weight gain -severe headaches -sudden numbness or weakness of the face, arm or leg -trouble walking, dizziness, loss of balance or coordination -seizures (convulsions) -swelling of the ankles, feet, hands -unusually weak or tired Side effects that usually do not require medical attention (report to your doctor or health care professional if they continue or are bothersome): -diarrhea -fever, chills (flu-like symptoms) -headaches -nausea, vomiting -redness, stinging, or swelling at site where injected This list may not describe all possible side effects. Call your doctor for medical advice about side effects. You may report side effects to FDA at 1-800-FDA-1088. Where should I keep my medicine? Keep out of the reach of children. Store in a refrigerator  between 2 and 8 degrees C (36 and 46 degrees F). Do not freeze. Do not shake. Throw away any unused portion if using a single-dose vial. Throw away any unused medicine after the expiration date. NOTE: This sheet is a summary. It may not cover all possible information. If you have questions about this medicine, talk to your doctor, pharmacist, or health care provider.  2015, [employer]. (2008-10-21 10:23:57)    I have been informed and understand all the instructions given to me. I know to contact the clinic, my physician, or go to the Emergency Department if any problems should occur. I do not have any questions at this time, but understand that I may  call the clinic during office hours or the Patient Navigator at [phone] should I have any questions or need assistance in obtaining follow up care.    __________________________________________  _____________  __________ Signature of Patient or Authorized Representative            Date                   Time    __________________________________________ Nurse's Signature

## 2015-02-04 NOTE — Assessment & Plan Note (Addendum)
Pleasant 59 year old female with stage IV adenocarcinoma of the lung. Currently on maintenance Alimta/Avastin and zometa.  She is doing very well. Her weight is slowly improving. Her mood is excellent.  Pre-chemo labs as ordered.  Chemotherapy today as planned.  Hold Zometa due to recent tooth extraction.  Will re-institute Avastin today as planned.  We will restage her malignancy in ~ 3 weeks with CT CAP with contrast.  Return in 3 weeks for follow-up.

## 2015-02-04 NOTE — Assessment & Plan Note (Addendum)
Anemia of chronic disease with a lack of vitamin deficiencies.  She is a candidate for ESA therapy and I discussed this in detail with the patient.  Consent ascertained for 681 mcg of Aranesp every 21 days with chemotherapy.

## 2015-02-04 NOTE — Progress Notes (Signed)
Melissa Cahill, MD  [place] [address]  Cancer of upper lobe of right lung - Plan: CT Abdomen Pelvis W Contrast, CT Chest W Contrast  Anemia in neoplastic disease  CURRENT THERAPY: Alimta/Avastin after receiving Cisplatin/Alimta/Avastin x 4 cycles.  INTERVAL HISTORY: Melissa Sullivan 59 y.o. female returns for followup of Stage IV adenocarcinoma of lung with right pancoast tumor, bilateral adrenal metastases, and retroperitoneal lymph nodes; complicated by a right brachial plexus neuralgia.     Cancer of upper lobe of right lung   07/28/2014 Imaging CT chest: Large R apical mass consistent with malignancy. This is destroying the R 2nd rib with extension into adjacent soft tissue. R hilar adenopathy with R 5cm adrenal metastatic lesion.   08/01/2014 Initial Biopsy Lung, needle/core biopsy(ies), right upper lobe - POORLY DIFFERENTIATED ADENOCARCINOMA, SEE COMMENT.   08/08/2014 PET scan Large hypermetabolic R apical mass with evidence of direct chest wall and mediastinal invasion, right retrocrural lymphadenopathy, extensive retroperitoneal lymphadenopathy, and metastatic lesions to the adrenal glands    09/02/2014 - 11/04/2014 Chemotherapy Cisplatin/Pemetrexed/Avastin every 21 days x 4 cycles   10/07/2014 - 10/27/2014 Radiation Therapy Right lung apex for control of brachioplexopathy.   12/24/2014 -  Chemotherapy Alimta/Avastin every 21 days.   I personally reviewed and went over laboratory results with the patient.  The results are noted within this dictation.  She appears to have an anemia secondary to chemotherapy.  Therefore, we will get her set up for ESA therapy.   She recently had a tooth extraction on 3/8 and therefore, we will hold Zometa, but move forward with Avastin as planned.  On PE the tooth socket is cleaned and well healed.    She reports that she saw her dentist recently and he reports that it is healing nicely.  She denies any major issues from this  perspective.    Oncologically, she denies any complaints and ROS questioning is negative.   Past Medical History  Diagnosis Date  . Hypertension   . Reflux   . Hyperlipidemia   . Lung mass 07/28/2014  . Adrenal mass, right 07/28/2014  . Diabetes mellitus without complication     has Adrenal mass, right; Cancer of upper lobe of right lung; ARF (acute renal failure); Abnormal finding on imaging; Oropharyngeal dysphagia; Nausea and vomiting; Hypercalcemia; and Anemia in neoplastic disease on her problem list.     has No Known Allergies.  Ms. Schwebke had no medications administered during this visit.  Past Surgical History  Procedure Laterality Date  . Appendectomy    . Lung biopsy Right 07/2014    CT guided  . Portacath placement Left 09/01/2014  . Esophagogastroduodenoscopy N/A 12/09/2014    [id number] esophageal stricture/mild-to-noderate erosive gastritis  . Savory dilation N/A 12/09/2014    Procedure: SAVORY DILATION;  Surgeon: [name], MD;  [facility]: [facility];  Service: Endoscopy;  Laterality: N/A;  [name] dilation N/A 12/09/2014    Procedure: [name] DILATION;  Surgeon: [name], MD;  Location: [facility];  Service: Endoscopy;  Laterality: N/A;    Denies any headaches, dizziness, double vision, fevers, chills, night sweats, nausea, vomiting, diarrhea, constipation, chest pain, heart palpitations, shortness of breath, blood in stool, black tarry stool, urinary pain, urinary burning, urinary frequency, hematuria.   PHYSICAL EXAMINATION  ECOG PERFORMANCE STATUS: 1 - Symptomatic but completely ambulatory  Filed Vitals:   02/04/15 1034  BP: 126/84  Pulse: 88  Temp: 98.2  F (36.8 C)  Resp: 18    GENERAL:alert, no distress, well nourished, well developed, cachectic, comfortable, cooperative and smiling SKIN: skin color, texture, turgor are normal, no rashes or significant lesions HEAD: Normocephalic, No masses, lesions, tenderness or abnormalities EYES:  normal, PERRLA, EOMI, Conjunctiva are pink and non-injected EARS: External ears normal OROPHARYNX:lips, buccal mucosa, and tongue normal, mucous membranes are moist and left lower tooth extraction site is well healed.  NECK: supple, trachea midline LYMPH:  no palpable lymphadenopathy BREAST:not examined LUNGS: clear to auscultation  HEART: regular rate & rhythm, no murmurs and no gallops ABDOMEN:abdomen soft and normal bowel sounds BACK: Back symmetric, no curvature., No CVA tenderness EXTREMITIES:less then 2 second capillary refill, no joint deformities, effusion, or inflammation, no skin discoloration, no clubbing, no cyanosis  NEURO: alert & oriented x 3 with fluent speech, no focal motor/sensory deficits  LABORATORY DATA: CBC    Component Value Date/Time   WBC 18.8* 02/04/2015 1050   RBC 3.58* 02/04/2015 1050   RBC 2.45* 01/15/2015 0901   HGB 10.8* 02/04/2015 1050   HCT 33.5* 02/04/2015 1050   PLT 397 02/04/2015 1050   MCV 93.6 02/04/2015 1050   MCH 30.2 02/04/2015 1050   MCHC 32.2 02/04/2015 1050   RDW 18.0* 02/04/2015 1050   LYMPHSABS 0.9 02/04/2015 1050   MONOABS 1.2* 02/04/2015 1050   EOSABS 0.0 02/04/2015 1050   BASOSABS 0.0 02/04/2015 1050      Chemistry      Component Value Date/Time   NA 133* 02/04/2015 1050   K 4.8 02/04/2015 1050   CL 101 02/04/2015 1050   CO2 25 02/04/2015 1050   BUN 26* 02/04/2015 1050   CREATININE 1.21* 02/04/2015 1050      Component Value Date/Time   CALCIUM 10.8* 02/04/2015 1050   ALKPHOS 86 02/04/2015 1050   AST 28 02/04/2015 1050   ALT 17 02/04/2015 1050   BILITOT 0.3 02/04/2015 1050       ASSESSMENT AND PLAN:  Cancer of upper lobe of right lung Pleasant 59 year old female with stage IV adenocarcinoma of the lung. Currently on maintenance Alimta/Avastin and zometa.  She is doing very well. Her weight is slowly improving. Her mood is excellent.  Pre-chemo labs as ordered.  Chemotherapy today as planned.  Hold Zometa due to  recent tooth extraction.  Will re-institute Avastin today as planned.  We will restage her malignancy in ~ 3 weeks with CT CAP with contrast.  Return in 3 weeks for follow-up.    Anemia in neoplastic disease Anemia of chronic disease with a lack of vitamin deficiencies.  She is a candidate for ESA therapy and I discussed this in detail with the patient.  Consent ascertained for 443 mcg of Aranesp every 21 days with chemotherapy.       THERAPY PLAN:  Continue with therapy as planned and will provide supportive care as needed.   All questions were answered. The patient knows to call the clinic with any problems, questions or concerns. We can certainly see the patient much sooner if necessary.  Patient and plan discussed with Dr. Ancil Linsey and she is in agreement with the aforementioned.   This note is electronically signed by: Robynn Pane 02/04/2015 12:00 PM

## 2015-02-05 ENCOUNTER — Telehealth: Payer: Self-pay

## 2015-02-05 ENCOUNTER — Other Ambulatory Visit: Payer: Self-pay

## 2015-02-05 DIAGNOSIS — R112 Nausea with vomiting, unspecified: Secondary | ICD-10-CM

## 2015-02-05 MED ORDER — ONDANSETRON HCL 4 MG PO TABS: 4.0000 mg | ORAL_TABLET | Freq: Three times a day (TID) | ORAL | Status: DC

## 2015-02-05 NOTE — Telephone Encounter (Signed)
Pt is now using the [facility] in [place], [place]. Rx for the Zofran faxed to pharmacy and pt is aware.

## 2015-02-06 DIAGNOSIS — C3411 Malignant neoplasm of upper lobe, right bronchus or lung: Secondary | ICD-10-CM | POA: Diagnosis not present

## 2015-02-06 LAB — PROTEIN, URINE, 24 HOUR
Collection Interval-UPROT: 24 hours
PROTEIN 24H UR: 651 mg/d — AB (ref 50–100)
Protein, Urine: 93 mg/dL
URINE TOTAL VOLUME-UPROT: 700 mL

## 2015-02-06 NOTE — Addendum Note (Signed)
Addended by: Joie Bimler on: 02/06/2015 11:15 AM   Modules accepted: Orders

## 2015-02-18 ENCOUNTER — Ambulatory Visit (HOSPITAL_COMMUNITY): Payer: Medicaid Other

## 2015-02-20 ENCOUNTER — Ambulatory Visit (HOSPITAL_COMMUNITY)
Admission: RE | Admit: 2015-02-20 | Discharge: 2015-02-20 | Disposition: A | Discharge status: Home / Self Care | Payer: 59 | Source: Ambulatory Visit | Attending: Oncology | Admitting: Oncology

## 2015-02-20 DIAGNOSIS — C3411 Malignant neoplasm of upper lobe, right bronchus or lung: Secondary | ICD-10-CM | POA: Insufficient documentation

## 2015-02-20 DIAGNOSIS — E278 Other specified disorders of adrenal gland: Secondary | ICD-10-CM | POA: Insufficient documentation

## 2015-02-20 DIAGNOSIS — Z79899 Other long term (current) drug therapy: Secondary | ICD-10-CM | POA: Diagnosis not present

## 2015-02-20 MED ORDER — IOHEXOL 300 MG/ML  SOLN
100.0000 mL | Freq: Once | INTRAMUSCULAR | Status: AC | PRN
  Administered 2015-02-20: 100 mL via INTRAVENOUS

## 2015-02-23 ENCOUNTER — Other Ambulatory Visit (HOSPITAL_COMMUNITY): Payer: Self-pay | Admitting: Oncology

## 2015-02-23 DIAGNOSIS — C3411 Malignant neoplasm of upper lobe, right bronchus or lung: Secondary | ICD-10-CM

## 2015-02-23 MED ORDER — OXYCODONE HCL 10 MG PO TABS: 10.0000 mg | ORAL_TABLET | Freq: Four times a day (QID) | ORAL | Status: DC | PRN

## 2015-02-25 ENCOUNTER — Encounter: Payer: Self-pay | Admitting: Dietician

## 2015-02-25 ENCOUNTER — Encounter (HOSPITAL_COMMUNITY): Payer: Self-pay | Admitting: Hematology & Oncology

## 2015-02-25 ENCOUNTER — Encounter (HOSPITAL_COMMUNITY): Payer: Medicaid Other | Attending: Hematology and Oncology

## 2015-02-25 ENCOUNTER — Encounter (HOSPITAL_BASED_OUTPATIENT_CLINIC_OR_DEPARTMENT_OTHER): Payer: Medicaid Other | Admitting: Hematology & Oncology

## 2015-02-25 ENCOUNTER — Encounter: Payer: Self-pay | Admitting: *Deleted

## 2015-02-25 VITALS — BP 126/81 | HR 102 | Temp 97.8°F | Resp 18 | Wt 79.7 lb

## 2015-02-25 DIAGNOSIS — E785 Hyperlipidemia, unspecified: Secondary | ICD-10-CM | POA: Insufficient documentation

## 2015-02-25 DIAGNOSIS — E278 Other specified disorders of adrenal gland: Secondary | ICD-10-CM

## 2015-02-25 DIAGNOSIS — C7972 Secondary malignant neoplasm of left adrenal gland: Secondary | ICD-10-CM | POA: Diagnosis not present

## 2015-02-25 DIAGNOSIS — Z5112 Encounter for antineoplastic immunotherapy: Secondary | ICD-10-CM | POA: Diagnosis present

## 2015-02-25 DIAGNOSIS — M859 Disorder of bone density and structure, unspecified: Secondary | ICD-10-CM

## 2015-02-25 DIAGNOSIS — C3411 Malignant neoplasm of upper lobe, right bronchus or lung: Secondary | ICD-10-CM | POA: Diagnosis present

## 2015-02-25 DIAGNOSIS — Z79899 Other long term (current) drug therapy: Secondary | ICD-10-CM | POA: Diagnosis not present

## 2015-02-25 DIAGNOSIS — F17210 Nicotine dependence, cigarettes, uncomplicated: Secondary | ICD-10-CM | POA: Insufficient documentation

## 2015-02-25 DIAGNOSIS — C7971 Secondary malignant neoplasm of right adrenal gland: Secondary | ICD-10-CM

## 2015-02-25 DIAGNOSIS — Z9089 Acquired absence of other organs: Secondary | ICD-10-CM | POA: Diagnosis not present

## 2015-02-25 DIAGNOSIS — K219 Gastro-esophageal reflux disease without esophagitis: Secondary | ICD-10-CM | POA: Diagnosis not present

## 2015-02-25 DIAGNOSIS — J449 Chronic obstructive pulmonary disease, unspecified: Secondary | ICD-10-CM | POA: Diagnosis not present

## 2015-02-25 DIAGNOSIS — I10 Essential (primary) hypertension: Secondary | ICD-10-CM | POA: Insufficient documentation

## 2015-02-25 DIAGNOSIS — C772 Secondary and unspecified malignant neoplasm of intra-abdominal lymph nodes: Secondary | ICD-10-CM | POA: Diagnosis not present

## 2015-02-25 DIAGNOSIS — C7989 Secondary malignant neoplasm of other specified sites: Secondary | ICD-10-CM | POA: Diagnosis not present

## 2015-02-25 DIAGNOSIS — C781 Secondary malignant neoplasm of mediastinum: Secondary | ICD-10-CM | POA: Insufficient documentation

## 2015-02-25 LAB — COMPREHENSIVE METABOLIC PANEL
ALBUMIN: 3.3 g/dL — AB (ref 3.5–5.2)
ALT: 15 U/L (ref 0–35)
AST: 25 U/L (ref 0–37)
Alkaline Phosphatase: 76 U/L (ref 39–117)
Anion gap: 10 (ref 5–15)
BUN: 17 mg/dL (ref 6–23)
CHLORIDE: 102 mmol/L (ref 96–112)
CO2: 22 mmol/L (ref 19–32)
Calcium: 10.6 mg/dL — ABNORMAL HIGH (ref 8.4–10.5)
Creatinine, Ser: 1.19 mg/dL — ABNORMAL HIGH (ref 0.50–1.10)
GFR calc Af Amer: 57 mL/min — ABNORMAL LOW (ref >90)
GFR calc non Af Amer: 49 mL/min — ABNORMAL LOW (ref >90)
GLUCOSE: 102 mg/dL — AB (ref 70–99)
Potassium: 4.5 mmol/L (ref 3.5–5.1)
Sodium: 134 mmol/L — ABNORMAL LOW (ref 135–145)
Total Bilirubin: 0.3 mg/dL (ref 0.3–1.2)
Total Protein: 8.6 g/dL — ABNORMAL HIGH (ref 6.0–8.3)

## 2015-02-25 LAB — CBC WITH DIFFERENTIAL/PLATELET
BASOS ABS: 0 10*3/uL (ref 0.0–0.1)
Basophils Relative: 0 % (ref 0–1)
Eosinophils Absolute: 0 10*3/uL (ref 0.0–0.7)
Eosinophils Relative: 0 % (ref 0–5)
HEMATOCRIT: 34 % — AB (ref 36.0–46.0)
Hemoglobin: 10.9 g/dL — ABNORMAL LOW (ref 12.0–15.0)
Lymphocytes Relative: 10 % — ABNORMAL LOW (ref 12–46)
Lymphs Abs: 0.8 10*3/uL (ref 0.7–4.0)
MCH: 30.1 pg (ref 26.0–34.0)
MCHC: 32.1 g/dL (ref 30.0–36.0)
MCV: 93.9 fL (ref 78.0–100.0)
MONO ABS: 0.7 10*3/uL (ref 0.1–1.0)
Monocytes Relative: 9 % (ref 3–12)
NEUTROS ABS: 6.8 10*3/uL (ref 1.7–7.7)
Neutrophils Relative %: 81 % — ABNORMAL HIGH (ref 43–77)
PLATELETS: 487 10*3/uL — AB (ref 150–400)
RBC: 3.62 MIL/uL — ABNORMAL LOW (ref 3.87–5.11)
RDW: 19.3 % — AB (ref 11.5–15.5)
WBC: 8.3 10*3/uL (ref 4.0–10.5)

## 2015-02-25 LAB — TSH: TSH: 0.695 u[IU]/mL (ref 0.350–4.500)

## 2015-02-25 MED ORDER — SODIUM CHLORIDE 0.9 % IJ SOLN: 10.0000 mL | INTRAMUSCULAR | Status: DC | PRN

## 2015-02-25 MED ORDER — SODIUM CHLORIDE 0.9 % IV SOLN
Freq: Once | INTRAVENOUS | Status: AC
  Administered 2015-02-25: 09:00:00 via INTRAVENOUS

## 2015-02-25 MED ORDER — SODIUM CHLORIDE 0.9 % IV SOLN: Freq: Once | INTRAVENOUS | Status: DC

## 2015-02-25 MED ORDER — SODIUM CHLORIDE 0.9 % IV SOLN
3.5000 mg | Freq: Once | INTRAVENOUS | Status: AC
  Administered 2015-02-25: 3.5 mg via INTRAVENOUS
  Filled 2015-02-25: qty 4.38

## 2015-02-25 MED ORDER — SODIUM CHLORIDE 0.9 % IV SOLN
3.0000 mg/kg | Freq: Once | INTRAVENOUS | Status: AC
  Administered 2015-02-25: 110 mg via INTRAVENOUS
  Filled 2015-02-25: qty 11

## 2015-02-25 MED ORDER — HEPARIN SOD (PORK) LOCK FLUSH 100 UNIT/ML IV SOLN
500.0000 [IU] | Freq: Once | INTRAVENOUS | Status: AC | PRN
  Administered 2015-02-25: 500 [IU]

## 2015-02-25 MED ORDER — PREDNISONE 10 MG PO TABS: ORAL_TABLET | ORAL | Status: DC

## 2015-02-25 NOTE — Progress Notes (Signed)
Melissa Sullivan Tumor, stage IV, bilateral adrenal metastases, retroperitoneal lymph nodes  Biopsy on 08/01/2014 c/w poorly differentiated adenocarcinoma  XRT completed on 10/27/2014  Right Brachial plexus neuralgia  EGFR mutation negative ALK mutation negative  Extensive soft tissue thickening in the posterior nasopharynx with bilateral level IIa lymphadenopathy in the neck. The possibility of a second primary neoplasm in this region warrants consideration. Clinical correlation is recommended.  CURRENT THERAPY: Cisplatin/Alimta/Avastin  INTERVAL HISTORY: Melissa Sullivan 59 y.o. female returns for follow-up of stage IV adenocarcinoma of the lung. She notes a decline in her appetite over the last two weeks and increasing pain in the right abdomen. She is here to review her re-staging CT scans.   She is dressing daily and active. She has no other major complaints.   MEDICAL HISTORY: Past Medical History  Diagnosis Date  . Hypertension   . Reflux   . Hyperlipidemia   . Lung mass 07/28/2014  . Adrenal mass, right 07/28/2014  . Diabetes mellitus without complication     has Adrenal mass, right; Cancer of upper lobe of right lung; ARF (acute renal failure); Abnormal finding on imaging; Oropharyngeal dysphagia; Nausea and vomiting; Hypercalcemia; and Anemia in neoplastic disease on her problem list.      Cancer of upper lobe of right lung   07/28/2014 Imaging CT chest: Large Melissa apical mass consistent with malignancy. This is destroying the Melissa 2nd rib with extension into adjacent soft tissue. Melissa hilar adenopathy with Melissa 5cm adrenal metastatic lesion.   08/01/2014 Initial Biopsy Lung, needle/core biopsy(ies), right upper lobe - POORLY DIFFERENTIATED ADENOCARCINOMA, SEE COMMENT.   08/08/2014 PET scan Large hypermetabolic Melissa apical mass with evidence of direct chest wall and mediastinal invasion, right retrocrural lymphadenopathy, extensive retroperitoneal lymphadenopathy, and metastatic lesions to  the adrenal glands    09/02/2014 - 11/04/2014 Chemotherapy Cisplatin/Pemetrexed/Avastin every 21 days x 4 cycles   10/07/2014 - 10/27/2014 Radiation Therapy Right lung apex for control of brachioplexopathy.   12/24/2014 -  Chemotherapy Alimta/Avastin every 21 days.     has No Known Allergies.  Melissa Sullivan does not currently have medications on file.  SURGICAL HISTORY: Past Surgical History  Procedure Laterality Date  . Appendectomy    . Lung biopsy Right 07/2014    CT guided  . Portacath placement Left 09/01/2014  . Esophagogastroduodenoscopy N/A 12/09/2014    [id number] esophageal stricture/mild-to-noderate erosive gastritis  . Savory dilation N/A 12/09/2014    Procedure: SAVORY DILATION;  Surgeon: [name], MD;  Location: [facility];  Service: Endoscopy;  Laterality: N/A;  [name] dilation N/A 12/09/2014    Procedure: [name] DILATION;  Surgeon: [name], MD;  Location: [facility];  Service: Endoscopy;  Laterality: N/A;    SOCIAL HISTORY: History   Social History  . Marital Status: Married    Spouse Name: N/A  . Number of Children: N/A  . Years of Education: N/A   Occupational History  . Not on file.   Social History Main Topics  . Smoking status: Light Tobacco Smoker -- 0.30 packs/day    Types: Cigarettes  . Smokeless tobacco: Never Used  . Alcohol Use: No  . Drug Use: No  . Sexual Activity: Not on file   Other Topics Concern  . Not on file   Social History Narrative    FAMILY HISTORY: Family History  Problem Relation Age of Onset  . Cancer Sister     Review of Systems  Constitutional: Positive for weight loss  and malaise/fatigue. Negative for fever and chills.  HENT: Negative for congestion, hearing loss, nosebleeds, sore throat and tinnitus.   Eyes: Negative for blurred vision, double vision, pain and discharge.  Respiratory: Negative for cough, hemoptysis, sputum production, shortness of breath and wheezing.   Cardiovascular: Negative  for chest pain, palpitations, claudication, leg swelling and PND.  Gastrointestinal: Positive for abdominal pain. Negative for heartburn, nausea, vomiting, diarrhea, constipation, blood in stool and melena.  Genitourinary: Negative for dysuria, urgency, frequency and hematuria.  Musculoskeletal: Negative for myalgias, joint pain and falls.  Skin: Negative for itching and rash.  Neurological: Negative for dizziness, tingling, tremors, sensory change, speech change, focal weakness, seizures, loss of consciousness, weakness and headaches.  Endo/Heme/Allergies: Does not bruise/bleed easily.  Psychiatric/Behavioral: Negative for depression, suicidal ideas, memory loss and substance abuse. The patient is not nervous/anxious and does not have insomnia.     PHYSICAL EXAMINATION  ECOG PERFORMANCE STATUS: 1 - Symptomatic but completely ambulatory  There were no vitals filed for this visit.  Physical Exam  Constitutional: She is oriented to person, place, and time.  HENT:  Head: Normocephalic and atraumatic.  Nose: Nose normal.  Mouth/Throat: Oropharynx is clear and moist. No oropharyngeal exudate.  Eyes: Conjunctivae and EOM are normal. Pupils are equal, round, and reactive to light. Right eye exhibits no discharge. Left eye exhibits no discharge. No scleral icterus.  Neck: Normal range of motion. Neck supple. No tracheal deviation present. No thyromegaly present.  Cardiovascular: Normal rate, regular rhythm and normal heart sounds.  Exam reveals no gallop and no friction rub.   No murmur heard. Pulmonary/Chest: Effort normal and breath sounds normal. She has no wheezes. She has no rales.  Abdominal: Soft. Bowel sounds are normal. She exhibits no distension and no mass. There is no tenderness. There is no rebound and no guarding.  Thin  Musculoskeletal: Normal range of motion. She exhibits no edema.  Lymphadenopathy:    She has no cervical adenopathy.  Neurological: She is alert and oriented to  person, place, and time. She has normal reflexes. No cranial nerve deficit. Gait normal. Coordination normal.  Skin: Skin is warm and dry. No rash noted.  Psychiatric: Mood, memory, affect and judgment normal.  Nursing note and vitals reviewed.   LABORATORY DATA: CBC    Component Value Date/Time   WBC 18.8* 02/04/2015 1050   RBC 3.58* 02/04/2015 1050   RBC 2.45* 01/15/2015 0901   HGB 10.8* 02/04/2015 1050   HCT 33.5* 02/04/2015 1050   PLT 397 02/04/2015 1050   MCV 93.6 02/04/2015 1050   MCH 30.2 02/04/2015 1050   MCHC 32.2 02/04/2015 1050   RDW 18.0* 02/04/2015 1050   LYMPHSABS 0.9 02/04/2015 1050   MONOABS 1.2* 02/04/2015 1050   EOSABS 0.0 02/04/2015 1050   BASOSABS 0.0 02/04/2015 1050   CMP     Component Value Date/Time   NA 133* 02/04/2015 1050   K 4.8 02/04/2015 1050   CL 101 02/04/2015 1050   CO2 25 02/04/2015 1050   GLUCOSE 106* 02/04/2015 1050   BUN 26* 02/04/2015 1050   CREATININE 1.21* 02/04/2015 1050   CALCIUM 10.8* 02/04/2015 1050   PROT 8.3 02/04/2015 1050   ALBUMIN 3.3* 02/04/2015 1050   AST 28 02/04/2015 1050   ALT 17 02/04/2015 1050   ALKPHOS 86 02/04/2015 1050   BILITOT 0.3 02/04/2015 1050   GFRNONAA 48* 02/04/2015 1050   GFRAA 56* 02/04/2015 1050   CLINICAL DATA: Right upper lobe stage IV non-small cell lung cancer.  Ongoing chemotherapy. Adrenal mass on prior exam.  EXAM: 02/20/2015 CT CHEST, ABDOMEN, AND PELVIS WITH CONTRAST  IMPRESSION: No significant interval change in right upper lobe mass compared to prior exam.  Progression of upper thoracic and right apical rib sclerosis which may indicate treatment effect on osseous metastatic disease. Benign radiation change is possible but felt less likely.  Areas of inspissated intrabronchial secretions with adjacent tree-in-bud type pulmonary parenchymal nodularity likely indicating small airways infection reidentified.  Increase in size of right adrenal metastasis and subjacent  confluent retrocaval lymphadenopathy.   Electronically Signed  By: Conchita Paris M.D.  On: 02/20/2015 12:47    ASSESSMENT and THERAPY PLAN:   Stage IV adenocarcinoma of the lung, EGFR and ALK negative  I reviewed her CT scans. Based on her clinical issues and imaging she has had progression of disease. Her response to chemotherapy in general was to merely stabilize most of her disease. I think the greatest benefit she received in the chest was from her radiation. I have recommended changing her therapy to [place]. She was taught on the side effects and benefits of treatment today. We will call her in a prescription for prednisone to have at home should she need it, we reviewed the potential side effects that she may have that would require her to use it; including diarrhea, cough, shortness of breath, severe rash.  Hypercalcemia/bony metastatic disease  She will continue with Zometa as prescribed.  All questions were answered. The patient knows to call the clinic with any problems, questions or concerns. We can certainly see the patient much sooner if necessary.  Molli Hazard 02/25/2015

## 2015-02-25 NOTE — Patient Instructions (Signed)
[place]   CHEMOTHERAPY INSTRUCTIONS  OPDIVO administration: You will receive this medication once every 2 weeks. It will take 1 hour for this medication to infuse.  You have been given an [place] wallet card. Please keep this in your wallet. You have also been given an Opdivo Patient Monitoring Checklist. On this checklist, while reviewing, if anything becomes a YES - please call us!   OPDIVO (nivolumab) is a prescription medicine used to treat a type of advanced stage lung cancer (non-small cell lung cancer) that has spread or grown and you have tried chemotherapy that contains platinum, and it did not work or is no longer working.   The most common side effects of OPDIVO in people with non-small cell lung cancer include: feeling tired; pain in muscles, bones, and joints; decreased appetite; cough; constipation; shortness of breath; and nausea.  Tell us immediately if you get these symptoms during an infusion of OPDIVO: chills or shaking; itching or rash; flushing; difficulty breathing; dizziness; fever; and feeling like passing out.   Serious side effects may include:     Lung problems (pneumonitis). Symptoms of pneumonitis may include: new or worsening cough; chest pain; and shortness of breath.     Intestinal problems (colitis) that can lead to tears or holes in your intestine. Signs and symptoms of colitis may include: diarrhea (loose stools) or more bowel movements than usual; blood in your stools or dark, tarry, sticky stools; and severe stomach area (abdomen) pain or tenderness.     Liver problems (hepatitis). Signs and symptoms of hepatitis may include: yellowing of your skin or the whites of your eyes; severe nausea or vomiting; pain on the right side of your stomach area (abdomen); drowsiness; dark urine (tea colored); bleeding or bruising more easily than normal; and feeling less hungry than usual.     Hormone gland problems (especially the  thyroid, pituitary, and glands). Signs and symptoms that your hormone glands are not working properly may include: headaches that will not go away or unusual headaches; extreme tiredness, weight gain or weight loss; dizziness or fainting; changes in mood or behavior, such as decreased sex drive, irritability, or forgetfulness; hair loss; feeling cold; constipation; and voice gets deeper.     Kidney problems, including nephritis and kidney failure. Signs of kidney problems may include: decrease in the amount of urine; blood in your urine; swelling in your ankles; and loss of appetite.     Inflammation of the brain (encephalitis). Signs and symptoms of encephalitis may include: headache; fever; tiredness or weakness; confusion; memory problems; sleepiness; seeing or hearing things that are not really there (hallucinations); seizures; and stiff neck.     Problems in other organs. Signs of these problems may include: rash; changes in eyesight; severe or persistent muscle or joint pains; and severe muscle weakness.   Do not ignore any symptoms!!!!! We need to know if you are having any side effects or potential side effects of this treatment!      EDUCATIONAL MATERIALS GIVEN AND REVIEWED: Specific Instructions Sheets: Opdivo, Prednisone   MEDICATIONS: You have been given prescriptions for the following medications:  Prednisone   SYMPTOMS TO REPORT AS SOON AS POSSIBLE AFTER TREATMENT:  FEVER GREATER THAN 100.5 F  CHILLS WITH OR WITHOUT FEVER  NAUSEA AND VOMITING THAT IS NOT CONTROLLED WITH YOUR NAUSEA MEDICATION  UNUSUAL SHORTNESS OF BREATH  UNUSUAL BRUISING OR BLEEDING  TENDERNESS IN MOUTH AND THROAT WITH OR WITHOUT PRESENCE OF ULCERS  URINARY  PROBLEMS  BOWEL PROBLEMS  UNUSUAL RASH    Wear comfortable clothing and clothing appropriate for easy access to any Portacath or PICC line. Let us know if there is anything that we can do to make your therapy better!      I  have been informed and understand all of the instructions given to me and have received a copy. I have been instructed to call the clinic [phone] or my family physician as soon as possible for continued medical care, if indicated. I do not have any more questions at this time but understand that I may call the [place] or the Patient Navigator at [phone] during office hours should I have questions or need assistance in obtaining follow-up care.           Nivolumab injection What is this medicine? NIVOLUMAB (nye VOL ue mab) is used to treat certain types of melanoma and lung cancer. This medicine may be used for other purposes; ask your health care provider or pharmacist if you have questions. COMMON BRAND NAME(S): Opdivo What should I tell my health care provider before I take this medicine? They need to know if you have any of these conditions: -eye disease, vision problems -history of pancreatitis -immune system problems -inflammatory bowel disease -kidney disease -liver disease -lung disease -lupus -myasthenia gravis -multiple sclerosis -organ transplant -stomach or intestine problems -thyroid disease -tingling of the fingers or toes, or other nerve disorder -an unusual or allergic reaction to nivolumab, other medicines, foods, dyes, or preservatives -pregnant or trying to get pregnant -breast-feeding How should I use this medicine? This medicine is for infusion into a vein. It is given by a health care professional in a hospital or clinic setting. A special MedGuide will be given to you before each treatment. Be sure to read this information carefully each time. Talk to your pediatrician regarding the use of this medicine in children. Special care may be needed. Overdosage: If you think you've taken too much of this medicine contact a poison control center or emergency room at once. Overdosage: If you think you have taken too much of this medicine  contact a poison control center or emergency room at once. NOTE: This medicine is only for you. Do not share this medicine with others. What if I miss a dose? It is important not to miss your dose. Call your doctor or health care professional if you are unable to keep an appointment. What may interact with this medicine? Interactions have not been studied. This list may not describe all possible interactions. Give your health care provider a list of all the medicines, herbs, non-prescription drugs, or dietary supplements you use. Also tell them if you smoke, drink alcohol, or use illegal drugs. Some items may interact with your medicine. What should I watch for while using this medicine? Tell your doctor or healthcare professional if your symptoms do not start to get better or if they get worse. Your condition will be monitored carefully while you are receiving this medicine. You may need blood work done while you are taking this medicine. What side effects may I notice from receiving this medicine? Side effects that you should report to your doctor or health care professional as soon as possible: -allergic reactions like skin rash, itching or hives, swelling of the face, lips, or tongue -black, tarry stools -bloody or watery diarrhea -changes in vision -chills -cough -depressed mood -eye pain -feeling anxious -fever -general ill feeling or flu-like symptoms -hair  loss -loss of appetite -low blood counts - this medicine may decrease the number of white blood cells, red blood cells and platelets. You may be at increased risk for infections and bleeding -pain, tingling, numbness in the hands or feet -redness, blistering, peeling or loosening of the skin, including inside the mouth -red pinpoint spots on skin -signs of decreased platelets or bleeding - bruising, pinpoint red spots on the skin, black, tarry stools, blood in the urine -signs of decreased red blood cells - unusually weak or  tired, feeling faint or lightheaded, falls -signs of infection - fever or chills, cough, sore throat, pain or trouble passing urine -signs and symptoms of a dangerous change in heartbeat or heart rhythm like chest pain; dizziness; fast or irregular heartbeat; palpitations; feeling faint or lightheaded, falls; breathing problems -signs and symptoms of high blood sugar such as dizziness; dry mouth; dry skin; fruity breath; nausea; stomach pain; increased hunger or thirst; increased urination -signs and symptoms of kidney injury like trouble passing urine or change in the amount of urine -signs and symptoms of liver injury like dark yellow or brown urine; general ill feeling or flu-like symptoms; light-colored stools; loss of appetite; nausea; right upper belly pain; unusually weak or tired; yellowing of the eyes or skin -signs and symptoms of increased potassium like muscle weakness; chest pain; or fast, irregular heartbeat -signs and symptoms of low potassium like muscle cramps or muscle pain; chest pain; dizziness; feeling faint or lightheaded, falls; palpitations; breathing problems; or fast, irregular heartbeat -swelling of the ankles, feet, hands -weight gainSide effects that usually do not require medical attention (report to your doctor or health care professional if they continue or are bothersome): -constipation -general ill feeling or flu-like symptoms -hair loss -loss of appetite -nausea, vomiting This list may not describe all possible side effects. Call your doctor for medical advice about side effects. You may report side effects to FDA at 1-800-FDA-1088. Where should I keep my medicine? This drug is given in a hospital or clinic and will not be stored at home. NOTE: This sheet is a summary. It may not cover all possible information. If you have questions about this medicine, talk to your doctor, pharmacist, or health care provider.  2015, Elsevier/Gold Standard. (2014-01-27  13:18:19) Prednisone tablets What is this medicine? PREDNISONE (PRED ni sone) is a corticosteroid. It is commonly used to treat inflammation of the skin, joints, lungs, and other organs. Common conditions treated include asthma, allergies, and arthritis. It is also used for other conditions, such as blood disorders and diseases of the adrenal glands. This medicine may be used for other purposes; ask your health care provider or pharmacist if you have questions. COMMON BRAND NAME(S): Deltasone, Predone, Sterapred, Sterapred DS What should I tell my health care provider before I take this medicine? They need to know if you have any of these conditions: -Cushing's syndrome -diabetes -glaucoma -heart disease -high blood pressure -infection (especially a virus infection such as chickenpox, cold sores, or herpes) -kidney disease -liver disease -mental illness -myasthenia gravis -osteoporosis -seizures -stomach or intestine problems -thyroid disease -an unusual or allergic reaction to lactose, prednisone, other medicines, foods, dyes, or preservatives -pregnant or trying to get pregnant -breast-feeding How should I use this medicine? Take this medicine by mouth with a glass of water. Follow the directions on the prescription label. Take this medicine with food. If you are taking this medicine once a day, take it in the morning. Do not take more medicine than  you are told to take. Do not suddenly stop taking your medicine because you may develop a severe reaction. Your doctor will tell you how much medicine to take. If your doctor wants you to stop the medicine, the dose may be slowly lowered over time to avoid any side effects. Talk to your pediatrician regarding the use of this medicine in children. Special care may be needed. Overdosage: If you think you have taken too much of this medicine contact a poison control center or emergency room at once. NOTE: This medicine is only for you. Do not  share this medicine with others. What if I miss a dose? If you miss a dose, take it as soon as you can. If it is almost time for your next dose, talk to your doctor or health care professional. You may need to miss a dose or take an extra dose. Do not take double or extra doses without advice. What may interact with this medicine? Do not take this medicine with any of the following medications: -metyrapone -mifepristone This medicine may also interact with the following medications: -aminoglutethimide -amphotericin B -aspirin and aspirin-like medicines -barbiturates -certain medicines for diabetes, like glipizide or glyburide -cholestyramine -cholinesterase inhibitors -cyclosporine -digoxin -diuretics -ephedrine -female hormones, like estrogens and birth control pills -isoniazid -ketoconazole -NSAIDS, medicines for pain and inflammation, like ibuprofen or naproxen -phenytoin -rifampin -toxoids -vaccines -warfarin This list may not describe all possible interactions. Give your health care provider a list of all the medicines, herbs, non-prescription drugs, or dietary supplements you use. Also tell them if you smoke, drink alcohol, or use illegal drugs. Some items may interact with your medicine. What should I watch for while using this medicine? Visit your doctor or health care professional for regular checks on your progress. If you are taking this medicine over a prolonged period, carry an identification card with your name and address, the type and dose of your medicine, and your doctor's name and address. This medicine may increase your risk of getting an infection. Tell your doctor or health care professional if you are around anyone with measles or chickenpox, or if you develop sores or blisters that do not heal properly. If you are going to have surgery, tell your doctor or health care professional that you have taken this medicine within the last twelve months. Ask your doctor  or health care professional about your diet. You may need to lower the amount of salt you eat. This medicine may affect blood sugar levels. If you have diabetes, check with your doctor or health care professional before you change your diet or the dose of your diabetic medicine. What side effects may I notice from receiving this medicine? Side effects that you should report to your doctor or health care professional as soon as possible: -allergic reactions like skin rash, itching or hives, swelling of the face, lips, or tongue -changes in emotions or moods -changes in vision -depressed mood -eye pain -fever or chills, cough, sore throat, pain or difficulty passing urine -increased thirst -swelling of ankles, feet Side effects that usually do not require medical attention (report to your doctor or health care professional if they continue or are bothersome): -confusion, excitement, restlessness -headache -nausea, vomiting -skin problems, acne, thin and shiny skin -trouble sleeping -weight gain This list may not describe all possible side effects. Call your doctor for medical advice about side effects. You may report side effects to FDA at 1-800-FDA-1088. Where should I keep my medicine? Keep out of  the reach of children. Store at room temperature between 15 and 30 degrees C (59 and 86 degrees F). Protect from light. Keep container tightly closed. Throw away any unused medicine after the expiration date. NOTE: This sheet is a summary. It may not cover all possible information. If you have questions about this medicine, talk to your doctor, pharmacist, or health care provider.  2015, Elsevier/Gold Standard. (2011-06-23 10:57:14)

## 2015-02-25 NOTE — Patient Instructions (Addendum)
[place] at [facility]  Discharge Instructions:  Your exam was completed by Dr Whitney Muse today. We are going to change your therapy to opdivo. Chemotherapy as scheduled every 2 weeks. Follow up with the doctor in 2 weeks. Please call the clinic if you have any questions or concerns.   Nivolumab injection What is this medicine? NIVOLUMAB (nye VOL ue mab) is used to treat certain types of melanoma and lung cancer. This medicine may be used for other purposes; ask your health care provider or pharmacist if you have questions. COMMON BRAND NAME(S): Opdivo What should I tell my health care provider before I take this medicine? They need to know if you have any of these conditions: -eye disease, vision problems -history of pancreatitis -immune system problems -inflammatory bowel disease -kidney disease -liver disease -lung disease -lupus -myasthenia gravis -multiple sclerosis -organ transplant -stomach or intestine problems -thyroid disease -tingling of the fingers or toes, or other nerve disorder -an unusual or allergic reaction to nivolumab, other medicines, foods, dyes, or preservatives -pregnant or trying to get pregnant -breast-feeding How should I use this medicine? This medicine is for infusion into a vein. It is given by a health care professional in a hospital or clinic setting. A special MedGuide will be given to you before each treatment. Be sure to read this information carefully each time. Talk to your pediatrician regarding the use of this medicine in children. Special care may be needed. Overdosage: If you think you've taken too much of this medicine contact a poison control center or emergency room at once. Overdosage: If you think you have taken too much of this medicine contact a poison control center or emergency room at once. NOTE: This medicine is only for you. Do not share this medicine with others. What if I miss a dose? It is  important not to miss your dose. Call your doctor or health care professional if you are unable to keep an appointment. What may interact with this medicine? Interactions have not been studied. This list may not describe all possible interactions. Give your health care provider a list of all the medicines, herbs, non-prescription drugs, or dietary supplements you use. Also tell them if you smoke, drink alcohol, or use illegal drugs. Some items may interact with your medicine. What should I watch for while using this medicine? Tell your doctor or healthcare professional if your symptoms do not start to get better or if they get worse. Your condition will be monitored carefully while you are receiving this medicine. You may need blood work done while you are taking this medicine. What side effects may I notice from receiving this medicine? Side effects that you should report to your doctor or health care professional as soon as possible: -allergic reactions like skin rash, itching or hives, swelling of the face, lips, or tongue -black, tarry stools -bloody or watery diarrhea -changes in vision -chills -cough -depressed mood -eye pain -feeling anxious -fever -general ill feeling or flu-like symptoms -hair loss -loss of appetite -low blood counts - this medicine may decrease the number of white blood cells, red blood cells and platelets. You may be at increased risk for infections and bleeding -pain, tingling, numbness in the hands or feet -redness, blistering, peeling or loosening of the skin, including inside the mouth -red pinpoint spots on skin -signs of decreased platelets or bleeding - bruising, pinpoint red spots on the skin, black, tarry stools, blood in the urine -signs of decreased red  blood cells - unusually weak or tired, feeling faint or lightheaded, falls -signs of infection - fever or chills, cough, sore throat, pain or trouble passing urine -signs and symptoms of a dangerous  change in heartbeat or heart rhythm like chest pain; dizziness; fast or irregular heartbeat; palpitations; feeling faint or lightheaded, falls; breathing problems -signs and symptoms of high blood sugar such as dizziness; dry mouth; dry skin; fruity breath; nausea; stomach pain; increased hunger or thirst; increased urination -signs and symptoms of kidney injury like trouble passing urine or change in the amount of urine -signs and symptoms of liver injury like dark yellow or brown urine; general ill feeling or flu-like symptoms; light-colored stools; loss of appetite; nausea; right upper belly pain; unusually weak or tired; yellowing of the eyes or skin -signs and symptoms of increased potassium like muscle weakness; chest pain; or fast, irregular heartbeat -signs and symptoms of low potassium like muscle cramps or muscle pain; chest pain; dizziness; feeling faint or lightheaded, falls; palpitations; breathing problems; or fast, irregular heartbeat -swelling of the ankles, feet, hands -weight gainSide effects that usually do not require medical attention (report to your doctor or health care professional if they continue or are bothersome): -constipation -general ill feeling or flu-like symptoms -hair loss -loss of appetite -nausea, vomiting This list may not describe all possible side effects. Call your doctor for medical advice about side effects. You may report side effects to FDA at 1-800-FDA-1088. Where should I keep my medicine? This drug is given in a hospital or clinic and will not be stored at home. NOTE: This sheet is a summary. It may not cover all possible information. If you have questions about this medicine, talk to your doctor, pharmacist, or health care provider.  2015, Elsevier/Gold Standard. (2014-01-27 13:18:19)  _______________________________________________________________  Thank you for choosing [place] at [facility] to provide your oncology  and hematology care.  To afford each patient quality time with our providers, please arrive at least 15 minutes before your scheduled appointment.  You need to re-schedule your appointment if you arrive 10 or more minutes late.  We strive to give you quality time with our providers, and arriving late affects you and other patients whose appointments are after yours.  Also, if you no show three or more times for appointments you may be dismissed from the clinic.  Again, thank you for choosing [place] at [place] hope is that these requests will allow you access to exceptional care and in a timely manner. _______________________________________________________________  If you have questions after your visit, please contact our office at [phone] between the hours of 8:30 a.m. and 5:00 p.m. Voicemails left after 4:30 p.m. will not be returned until the following business day. _______________________________________________________________  For prescription refill requests, have your pharmacy contact our office. _______________________________________________________________  Recommendations made by the consultant and any test results will be sent to your referring physician. _______________________________________________________________

## 2015-02-25 NOTE — Progress Notes (Signed)
Patient referred by Education officer, museum.  Contacted Pt by visiting her during her infusion  Wt Readings from Last 10 Encounters:  02/25/15 79 lb 11.2 oz (36.152 kg)  02/04/15 82 lb (37.195 kg)  01/14/15 81 lb (36.741 kg)  12/24/14 78 lb (35.381 kg)  12/16/14 78 lb (35.381 kg)  12/05/14 75 lb 9.6 oz (34.292 kg)  12/04/14 77 lb 1.6 oz (34.972 kg)  11/25/14 77 lb 6.4 oz (35.108 kg)  11/04/14 84 lb 6.4 oz (38.284 kg)  10/21/14 91 lb (41.277 kg)   Patient weight has decreased by 12 pounds since early December and 23 pounds since September 2015. However, recently her weight has been stable and she has gained a few pounds since end of January/early February  Patient reports oral intake as poor-fair, but improving. She is suffering from symptoms including a poor appetite and nausea/vomiting.   Pt states that she drinks 3-4 Boost/Ensure Supplements a day. She states that she is unable to keep "real" food down and can only tolerate the ONS. She states she is improving though and has had a recent increase in appetite. She is unsure why.   She was interested in the free ensure cases. There was already a strawberry case available so I gave that to her.   Also let her know to contact me with any problems related to nutrition. Left my contact info, coupons,  and handouts titled "Poor Appetite" and "Nausea and Vomiting".   [name] RD, LDN Nutrition Pager: [phone] 02/25/2015 11:47 AM

## 2015-02-25 NOTE — Progress Notes (Signed)
Hemoglobin 10.9, aranesp not given, treatment parameters not met. Tolerated chemo well.

## 2015-02-25 NOTE — Progress Notes (Signed)
Teaching done and consent signed for Opdivo. Immunotherapy card and Opdivo wallet card given to patient. Opdivo checklist given to patient.

## 2015-02-25 NOTE — Progress Notes (Signed)
[place] Clinical Social Work  Clinical Social Work was referred by [facility] rounding and nurse for assessment of psychosocial needs due to request for handicap sticker and nutrition referral.  Clinical Social Worker met with patient and daughter to offer support and assess for needs.  Melissa Sullivan reports to be doing ok. She requests assistance with handicap sticker, which CSW and MD completed and gave to her. She is aware to take that to the [facility]. CSW contacted Melissa Sullivan to come see pt and CSW provided pt with Ensure coupons as well. CSW and pt completed next referral to the [facility] for another gas card as well. Pt seemed in good spirits today, despite progression of disease. CSW encouraged pt to attend Support Group next week as well, but she may not be able to attend due to distance. Pt and daughter aware and agree to contact CSW as needed.   Clinical Social Work interventions: Set designer and referral Emotional support  Melissa Sullivan, [place] Tuesdays 8:30-1pm Wednesdays 8:30-12pm  [phone]

## 2015-02-26 ENCOUNTER — Telehealth (HOSPITAL_COMMUNITY): Payer: Self-pay

## 2015-02-26 NOTE — Telephone Encounter (Signed)
Call from patient with complaints of constipation.  Has been take 2 stool softeners daily along with Miralax.  Instructed to increase stool softener to 2 bid , continue miralax daily and to add milk of magnesia if needed.  To call back if she has further issues.  Verbalized understanding of instructions.

## 2015-03-04 ENCOUNTER — Encounter: Payer: Self-pay | Admitting: Gastroenterology

## 2015-03-04 ENCOUNTER — Ambulatory Visit (INDEPENDENT_AMBULATORY_CARE_PROVIDER_SITE_OTHER): Payer: Medicaid Other | Admitting: Gastroenterology

## 2015-03-04 VITALS — BP 108/72 | HR 89 | Temp 97.7°F | Ht 59.0 in | Wt 80.6 lb

## 2015-03-04 DIAGNOSIS — R1310 Dysphagia, unspecified: Secondary | ICD-10-CM

## 2015-03-04 DIAGNOSIS — Z1211 Encounter for screening for malignant neoplasm of colon: Secondary | ICD-10-CM | POA: Diagnosis not present

## 2015-03-04 NOTE — Patient Instructions (Signed)
Continue Prilosec twice a day.   We will see you in 6 months!   Please call if any changes in the meantime.

## 2015-03-04 NOTE — Progress Notes (Signed)
Referring Provider: [name], MD Primary Care Physician:  [name], MD  Primary GI: Dr. [name]   Chief Complaint  Patient presents with  . Follow-up    HPI:   Melissa Sullivan is a 59 y.o. female presenting today with a history of stage IV adenocarcinoma of the lung, recently undergoing EGD with Savary dilation secondary to dysphagia. 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 Negative KOH.   Poor appetite. No abdominal pain. Up 5 lbs since Jan 2016. No constipation or diarrhea. Last colonoscopy ended up being a flexible sigmoidoscopy in 2011 with hyperplastic polyps. Recommendations were for a repeat colonoscopy in 5 years (2016) with pediatric colonoscope and Propofol for sedation. Prilosec BID. Dysphagia resolved. No GI complaints today. Requesting something for appetite.   Past Medical History  Diagnosis Date  . Hypertension   . Reflux   . Hyperlipidemia   . Lung mass 07/28/2014  . Adrenal mass, right 07/28/2014  . Diabetes mellitus without complication     Past Surgical History  Procedure Laterality Date  . Appendectomy    . Lung biopsy Right 07/2014    CT guided  . Portacath placement Left 09/01/2014  . Esophagogastroduodenoscopy N/A 12/09/2014    ENI:DPOEUM esophageal stricture/mild-to-noderate erosive gastritis. negative H.pylori  . Savory dilation N/A 12/09/2014    Procedure: SAVORY DILATION;  Surgeon: [name], MD;  [facility];  Service: Endoscopy;  Laterality: N/A;  [name] dilation N/A 12/09/2014    Procedure: [name] DILATION;  Surgeon: [name], MD;  Location: [facility];  Service: Endoscopy;  Laterality: N/A;  . Flexible sigmoidoscopy  2011    Dr. [name]: hyperplastic polyp    Current Outpatient Prescriptions  Medication Sig Dispense Refill  . Alum & Mag Hydroxide-Simeth (MAGIC MOUTHWASH W/LIDOCAINE) SOLN Take 5 mLs by mouth 4 (four) times daily as needed for mouth pain. 400 mL 4  .  Fluticasone-Salmeterol (ADVAIR DISKUS) 500-50 MCG/DOSE AEPB Two puffs at the same time daily. 60 each 6  . folic acid (FOLVITE) 1 MG tablet Take 1 mg by mouth every morning.    . hydrochlorothiazide (MICROZIDE) 12.5 MG capsule Take 12.5 mg by mouth every morning.     Marland Kitchen HYDROcodone-homatropine (HYCODAN) 5-1.5 MG/5ML syrup Take 5 mLs by mouth every 6 (six) hours as needed for cough. 200 mL 0  . ibuprofen (ADVIL,MOTRIN) 200 MG tablet Take 200-800 mg by mouth once as needed for headache or mild pain.    . Iron-FA-B Cmp-C-Biot-Probiotic (FUSION PLUS) CAPS Take 1 capsule by mouth daily.     Marland Kitchen lidocaine-prilocaine (EMLA) cream Apply a quarter size amount to port site 1 hour prior to chemo. Do not rub in. Cover with plastic wrap. 30 g 3  . megestrol (MEGACE) 40 MG/ML suspension Take 400 mg by mouth daily.     . metFORMIN (GLUCOPHAGE) 500 MG tablet Take 500 mg by mouth 2 (two) times daily with a meal.    . Nivolumab (OPDIVO IV) Inject into the vein every 14 (fourteen) days.    Marland Kitchen omeprazole (PRILOSEC) 20 MG capsule Take 1 capsule (20 mg total) by mouth 2 (two) times daily before a meal. 60 capsule 11  . ondansetron (ZOFRAN) 4 MG tablet Take 1 tablet (4 mg total) by mouth every 8 (eight) hours. 60 tablet 1  . Oxycodone HCl 10 MG TABS Take 1-2 tablets (10-20 mg total) by mouth every 6 (six) hours as needed (pain.). 100 tablet 0  .  potassium chloride SA (K-DUR,KLOR-CON) 20 MEQ tablet Take 1 tablet (20 mEq total) by mouth 2 (two) times daily. 60 tablet 3  . predniSONE (DELTASONE) 10 MG tablet Prednisone 80mg  (8 tabs) by mouth daily x 3 days, then 70mg  (7 tabs) x 3 days, 60mg  (6 tabs) x 3 days, 50mg  (5 tabs) x 3 days, 40mg  (4 tabs) x 3 days, 30mg  (3 tabs) x 3 days, 20mg  (2 tabs) x 3 days, 10mg  (1 tab) x 3 days, 5mg  (1/2 tab) x 3 days. Start @ 1st sign of diarrhea. 120 tablet 0  . prochlorperazine (COMPAZINE) 10 MG tablet Starting the day after chemo, take 1 tab four times a day x 2 days. Then may take 1 tab four  times a day IF needed for nausea/vomiting. 60 tablet 2  . senna (SENOKOT) 8.6 MG tablet Take 1 tablet by mouth at bedtime.    . simvastatin (ZOCOR) 40 MG tablet Take 40 mg by mouth every morning.     . triamcinolone (KENALOG) 0.025 % cream Apply 1 application topically 2 (two) times daily.    . fentaNYL (DURAGESIC - DOSED MCG/HR) 12 MCG/HR Place 1 patch (12.5 mcg total) onto the skin every 3 (three) days. (Patient not taking: Reported on 12/24/2014) 10 patch 0  . fentaNYL (DURAGESIC - DOSED MCG/HR) 25 MCG/HR patch Place 1 patch (25 mcg total) onto the skin every 3 (three) days. (Patient not taking: Reported on 12/24/2014) 10 patch 0   No current facility-administered medications for this visit.    Allergies as of 03/04/2015  . (No Known Allergies)    Family History  Problem Relation Age of Onset  . Cancer Sister     History   Social History  . Marital Status: Married    Spouse Name: N/A  . Number of Children: N/A  . Years of Education: N/A   Social History Main Topics  . Smoking status: Light Tobacco Smoker -- 0.30 packs/day    Types: Cigarettes  . Smokeless tobacco: Never Used  . Alcohol Use: No  . Drug Use: No  . Sexual Activity: Not on file   Other Topics Concern  . None   Social History Narrative    Review of Systems: As mentioned in HPI  Physical Exam: BP 108/72 mmHg  Pulse 89  Temp(Src) 97.7 F (36.5 C)  Ht 4\' 11"  (1.499 m)  Wt 80 lb 9.6 oz (36.56 kg)  BMI 16.27 kg/m2 General:   Alert and oriented. No distress noted. Pleasant and cooperative. Thin.  Head:  Normocephalic and atraumatic. Abdomen:  +BS, soft, non-tender and non-distended. No rebound or guarding. No HSM or masses noted. Msk:  Symmetrical without gross deformities. Normal posture. Extremities:  Without edema. Neurologic:  Alert and  oriented x4;  grossly normal neurologically. Psych:  Alert and cooperative. Normal mood and affect.

## 2015-03-06 ENCOUNTER — Encounter: Payer: Self-pay | Admitting: Gastroenterology

## 2015-03-06 DIAGNOSIS — Z1211 Encounter for screening for malignant neoplasm of colon: Secondary | ICD-10-CM | POA: Insufficient documentation

## 2015-03-06 NOTE — Assessment & Plan Note (Signed)
No prior full colonoscopy; flex sig completed in 2011. Unable to complete lower GI evaluation due to agitation. Needs colonoscopy in near future with pediatric colonoscope and Propofol. This was realized after patient left. Will reach out to patient to see if she is willing to proceed.

## 2015-03-06 NOTE — Progress Notes (Signed)
I realized after patient left that she is actually due for a colonoscopy. Underwent flex sig in 2011 (originally intended to be a colonoscopy but she was agitated and could not complete this).   If she is willing, needs to be set up for a colonoscopy with pediatric colonoscope AND PROPOFOL with Dr. [name].

## 2015-03-06 NOTE — Assessment & Plan Note (Signed)
Secondary to stricture at GE junction due to reflux. Continue Prilosec BID. Symptoms resolved, up 5 lbs since Jan 2016. Requesting appetite stimulant. She is already on Megace. Will reach out to oncology for recommendations. Continue Boost supplementation. Return in 6 months.

## 2015-03-09 ENCOUNTER — Other Ambulatory Visit: Payer: Self-pay

## 2015-03-09 MED ORDER — PEG 3350-KCL-NA BICARB-NACL 420 G PO SOLR: 4000.0000 mL | Freq: Once | ORAL | Status: DC

## 2015-03-09 NOTE — Progress Notes (Signed)
Called pt and she agreed to have colonoscopy set up. She is set up for SLF on 03/24/2015 @ 11:30am in the OR.  Pre op is set up for 03/19/2015 @ 9:00am.   RX sent to pharmacy and instructions were mailed to pt.   Pt is aware.

## 2015-03-10 NOTE — Progress Notes (Signed)
CC'ED TO PCP 

## 2015-03-11 ENCOUNTER — Encounter: Payer: Self-pay | Admitting: *Deleted

## 2015-03-11 ENCOUNTER — Encounter (HOSPITAL_BASED_OUTPATIENT_CLINIC_OR_DEPARTMENT_OTHER): Payer: Medicaid Other | Admitting: Hematology & Oncology

## 2015-03-11 ENCOUNTER — Encounter (HOSPITAL_BASED_OUTPATIENT_CLINIC_OR_DEPARTMENT_OTHER): Payer: Medicaid Other

## 2015-03-11 VITALS — BP 108/74 | HR 95 | Temp 99.0°F | Resp 18 | Wt 79.6 lb

## 2015-03-11 VITALS — BP 106/70 | HR 81 | Temp 98.1°F | Resp 18

## 2015-03-11 DIAGNOSIS — C7971 Secondary malignant neoplasm of right adrenal gland: Secondary | ICD-10-CM

## 2015-03-11 DIAGNOSIS — C3411 Malignant neoplasm of upper lobe, right bronchus or lung: Secondary | ICD-10-CM

## 2015-03-11 DIAGNOSIS — D630 Anemia in neoplastic disease: Secondary | ICD-10-CM

## 2015-03-11 DIAGNOSIS — R140 Abdominal distension (gaseous): Secondary | ICD-10-CM | POA: Diagnosis not present

## 2015-03-11 DIAGNOSIS — C3491 Malignant neoplasm of unspecified part of right bronchus or lung: Secondary | ICD-10-CM | POA: Diagnosis present

## 2015-03-11 DIAGNOSIS — R634 Abnormal weight loss: Secondary | ICD-10-CM

## 2015-03-11 DIAGNOSIS — C7951 Secondary malignant neoplasm of bone: Secondary | ICD-10-CM

## 2015-03-11 DIAGNOSIS — Z5112 Encounter for antineoplastic immunotherapy: Secondary | ICD-10-CM

## 2015-03-11 DIAGNOSIS — C3492 Malignant neoplasm of unspecified part of left bronchus or lung: Secondary | ICD-10-CM

## 2015-03-11 DIAGNOSIS — E278 Other specified disorders of adrenal gland: Secondary | ICD-10-CM

## 2015-03-11 LAB — CBC
HCT: 34 % — ABNORMAL LOW (ref 36.0–46.0)
HEMOGLOBIN: 10.8 g/dL — AB (ref 12.0–15.0)
MCH: 30.1 pg (ref 26.0–34.0)
MCHC: 31.8 g/dL (ref 30.0–36.0)
MCV: 94.7 fL (ref 78.0–100.0)
Platelets: 236 10*3/uL (ref 150–400)
RBC: 3.59 MIL/uL — AB (ref 3.87–5.11)
RDW: 19.7 % — ABNORMAL HIGH (ref 11.5–15.5)
WBC: 7.6 10*3/uL (ref 4.0–10.5)

## 2015-03-11 LAB — COMPREHENSIVE METABOLIC PANEL
ALBUMIN: 3.3 g/dL — AB (ref 3.5–5.2)
ALT: 9 U/L (ref 0–35)
ANION GAP: 11 (ref 5–15)
AST: 27 U/L (ref 0–37)
Alkaline Phosphatase: 72 U/L (ref 39–117)
BILIRUBIN TOTAL: 0.3 mg/dL (ref 0.3–1.2)
BUN: 14 mg/dL (ref 6–23)
CHLORIDE: 99 mmol/L (ref 96–112)
CO2: 24 mmol/L (ref 19–32)
CREATININE: 1.47 mg/dL — AB (ref 0.50–1.10)
Calcium: 10.4 mg/dL (ref 8.4–10.5)
GFR, EST AFRICAN AMERICAN: 44 mL/min — AB (ref >90)
GFR, EST NON AFRICAN AMERICAN: 38 mL/min — AB (ref >90)
GLUCOSE: 114 mg/dL — AB (ref 70–99)
Potassium: 3.8 mmol/L (ref 3.5–5.1)
SODIUM: 134 mmol/L — AB (ref 135–145)
Total Protein: 8.5 g/dL — ABNORMAL HIGH (ref 6.0–8.3)

## 2015-03-11 LAB — DIFFERENTIAL
BASOS PCT: 0 % (ref 0–1)
Basophils Absolute: 0 10*3/uL (ref 0.0–0.1)
Eosinophils Absolute: 0.3 10*3/uL (ref 0.0–0.7)
Eosinophils Relative: 3 % (ref 0–5)
LYMPHS PCT: 21 % (ref 12–46)
Lymphs Abs: 1.6 10*3/uL (ref 0.7–4.0)
MONOS PCT: 13 % — AB (ref 3–12)
Monocytes Absolute: 1 10*3/uL (ref 0.1–1.0)
NEUTROS PCT: 62 % (ref 43–77)
Neutro Abs: 4.7 10*3/uL (ref 1.7–7.7)

## 2015-03-11 MED ORDER — SODIUM CHLORIDE 0.9 % IV SOLN
3.0000 mg/kg | Freq: Once | INTRAVENOUS | Status: AC
  Administered 2015-03-11: 110 mg via INTRAVENOUS
  Filled 2015-03-11: qty 11

## 2015-03-11 MED ORDER — SODIUM CHLORIDE 0.9 % IV SOLN
Freq: Once | INTRAVENOUS | Status: AC
  Administered 2015-03-11: 12:00:00 via INTRAVENOUS

## 2015-03-11 MED ORDER — METOCLOPRAMIDE HCL 10 MG PO TABS: 10.0000 mg | ORAL_TABLET | Freq: Four times a day (QID) | ORAL | Status: DC

## 2015-03-11 MED ORDER — SODIUM CHLORIDE 0.9 % IJ SOLN
10.0000 mL | INTRAMUSCULAR | Status: DC | PRN
  Administered 2015-03-11: 10 mL
  Filled 2015-03-11: qty 10

## 2015-03-11 MED ORDER — HEPARIN SOD (PORK) LOCK FLUSH 100 UNIT/ML IV SOLN
500.0000 [IU] | Freq: Once | INTRAVENOUS | Status: AC | PRN
  Administered 2015-03-11: 500 [IU]

## 2015-03-11 NOTE — Patient Instructions (Signed)
[place] at [facility] Discharge Instructions  RECOMMENDATIONS MADE BY THE CONSULTANT AND ANY TEST RESULTS WILL BE SENT TO YOUR REFERRING PHYSICIAN.  Exam and discussion by Dr. Whitney Muse. Will continue with therapy  Report fevers, uncontrolled nausea, vomiting or other concerns.  Follow-up in 2 weeks with labs, office visit and chemotherapy.  Thank you for choosing [place] [facility] to provide your oncology and hematology care.  To afford each patient quality time with our provider, please arrive at least 15 minutes before your scheduled appointment time.    You need to re-schedule your appointment should you arrive 10 or more minutes late.  We strive to give you quality time with our providers, and arriving late affects you and other patients whose appointments are after yours.  Also, if you no show three or more times for appointments you may be dismissed from the clinic at the providers discretion.     Again, thank you for choosing [facility].  Our hope is that these requests will decrease the amount of time that you wait before being seen by our physicians.       _____________________________________________________________  Should you have questions after your visit to [facility], please contact our office at [phone] between the hours of 8:30 a.m. and 4:30 p.m.  Voicemails left after 4:30 p.m. will not be returned until the following business day.  For prescription refill requests, have your pharmacy contact our office.

## 2015-03-11 NOTE — Patient Instructions (Signed)
[facility] Discharge Instructions for Patients Receiving Chemotherapy  Today you received the following chemotherapy agents:  nivolumab (Opdivo)  If you develop nausea and vomiting, or diarrhea that is not controlled by your medication, call the clinic.  The clinic phone number is [phone]. Office hours are Monday-Friday 8:30am-5:00pm.  BELOW ARE SYMPTOMS THAT SHOULD BE REPORTED IMMEDIATELY:  *FEVER GREATER THAN 101.0 F  *CHILLS WITH OR WITHOUT FEVER  NAUSEA AND VOMITING THAT IS NOT CONTROLLED WITH YOUR NAUSEA MEDICATION  *UNUSUAL SHORTNESS OF BREATH  *UNUSUAL BRUISING OR BLEEDING  TENDERNESS IN MOUTH AND THROAT WITH OR WITHOUT PRESENCE OF ULCERS  *URINARY PROBLEMS  *BOWEL PROBLEMS  UNUSUAL RASH Items with * indicate a potential emergency and should be followed up as soon as possible. If you have an emergency after office hours please contact your primary care physician or go to the nearest emergency department.  Please call the clinic during office hours if you have any questions or concerns.   You may also contact the Patient Navigator at [phone] should you have any questions or need assistance in obtaining follow up care. _____________________________________________________________________ Have you asked about our STAR program?    STAR stands for Survivorship Training and Rehabilitation, and this is a nationally recognized cancer care program that focuses on survivorship and rehabilitation.  Cancer and cancer treatments may cause problems, such as, pain, making you feel tired and keeping you from doing the things that you need or want to do. Cancer rehabilitation can help. Our goal is to reduce these troubling effects and help you have the best quality of life possible.  You may receive a survey from a nurse that asks questions about your current state of health.  Based on the survey results, all eligible patients will be referred to the [facility]  program for an evaluation so we can better serve you! A frequently asked questions sheet is available upon request.

## 2015-03-11 NOTE — Progress Notes (Signed)
[place] Clinical Social Work  Clinical Social Work was referred by patient for assessment of psychosocial needs due to request for her Ensure that was left at Pharmacy.  Clinical Social Worker assisted with staff getting requested item. Pt reports she is doing well today. CSW informed her that gas card was sent off last week. Pt denied other concerns.   Clinical Social Work interventions: Resource assistance   [name], [place] Tuesdays 8:30-1pm Wednesdays 8:30-12pm  [phone]

## 2015-03-11 NOTE — Progress Notes (Signed)
Tolerated infusion well with no adverse reactions.  A&ox4; VSS; left in c/o family for transport home - discharged ambulatory.

## 2015-03-17 NOTE — Patient Instructions (Signed)
LORRIN BODNER  03/17/2015   Your procedure is scheduled on:  03/24/2015  Report to [facility] at 10:00 AM.  Call this number if you have problems the morning of surgery: [phone]   Remember:   Do not eat food or drink liquids after midnight.   Take these medicines the morning of surgery with A SIP OF WATER: Fentanyl patch, Advair, Hydrocodone, Megace, Reglan, Prilosec, Oxycodone, Prednisone   DO NOT TAKE DIABETIC MEDICATION MORNING OF PROCEDURE   Do not wear jewelry, make-up or nail polish.  Do not wear lotions, powders, or perfumes. You may wear deodorant.  Do not shave 48 hours prior to surgery. Men may shave face and neck.  Do not bring valuables to the hospital.  [facility] is not responsible for any belongings or valuables.               Contacts, dentures or bridgework may not be worn into surgery.  Leave suitcase in the car. After surgery it may be brought to your room.  For patients admitted to the hospital, discharge time is determined by your treatment team.               Patients discharged the day of surgery will not be allowed to drive home.  Name and phone number of your driver:   Special Instructions: N/A   Please read over the following fact sheets that you were given: Anesthesia Post-op Instructions   PATIENT INSTRUCTIONS POST-ANESTHESIA  IMMEDIATELY FOLLOWING SURGERY:  Do not drive or operate machinery for the first twenty four hours after surgery.  Do not make any important decisions for twenty four hours after surgery or while taking narcotic pain medications or sedatives.  If you develop intractable nausea and vomiting or a severe headache please notify your doctor immediately.  FOLLOW-UP:  Please make an appointment with your surgeon as instructed. You do not need to follow up with anesthesia unless specifically instructed to do so.  WOUND CARE INSTRUCTIONS (if applicable):  Keep a dry clean dressing on the anesthesia/puncture wound site if there is  drainage.  Once the wound has quit draining you may leave it open to air.  Generally you should leave the bandage intact for twenty four hours unless there is drainage.  If the epidural site drains for more than 36-48 hours please call the anesthesia department.  QUESTIONS?:  Please feel free to call your physician or the hospital operator if you have any questions, and they will be happy to assist you.      Colonoscopy A colonoscopy is an exam to look at the entire large intestine (colon). This exam can help find problems such as tumors, polyps, inflammation, and areas of bleeding. The exam takes about 1 hour.  LET [facility] CARE PROVIDER KNOW ABOUT:   Any allergies you have.  All medicines you are taking, including vitamins, herbs, eye drops, creams, and over-the-counter medicines.  Previous problems you or members of your family have had with the use of anesthetics.  Any blood disorders you have.  Previous surgeries you have had.  Medical conditions you have. RISKS AND COMPLICATIONS  Generally, this is a safe procedure. However, as with any procedure, complications can occur. Possible complications include:  Bleeding.  Tearing or rupture of the colon wall.  Reaction to medicines given during the exam.  Infection (rare). BEFORE THE PROCEDURE   Ask your health care provider about changing or stopping your regular medicines.  You may be prescribed an oral  bowel prep. This involves drinking a large amount of medicated liquid, starting the day before your procedure. The liquid will cause you to have multiple loose stools until your stool is almost clear or light green. This cleans out your colon in preparation for the procedure.  Do not eat or drink anything else once you have started the bowel prep, unless your health care provider tells you it is safe to do so.  Arrange for someone to drive you home after the procedure. PROCEDURE   You will be given medicine to help you  relax (sedative).  You will lie on your side with your knees bent.  A long, flexible tube with a light and camera on the end (colonoscope) will be inserted through the rectum and into the colon. The camera sends video back to a computer screen as it moves through the colon. The colonoscope also releases carbon dioxide gas to inflate the colon. This helps your health care provider see the area better.  During the exam, your health care provider may take a small tissue sample (biopsy) to be examined under a microscope if any abnormalities are found.  The exam is finished when the entire colon has been viewed. AFTER THE PROCEDURE   Do not drive for 24 hours after the exam.  You may have a small amount of blood in your stool.  You may pass moderate amounts of gas and have mild abdominal cramping or bloating. This is caused by the gas used to inflate your colon during the exam.  Ask when your test results will be ready and how you will get your results. Make sure you get your test results. Document Released: 11/04/2000 Document Revised: 08/28/2013 Document Reviewed: 07/15/2013 [facility] Patient Information 2015 [place], [place]. This information is not intended to replace advice given to you by your health care provider. Make sure you discuss any questions you have with your health care provider.

## 2015-03-19 ENCOUNTER — Encounter (HOSPITAL_COMMUNITY)
Admission: RE | Admit: 2015-03-19 | Discharge: 2015-03-19 | Disposition: A | Discharge status: Home / Self Care | Payer: Medicaid Other | Source: Ambulatory Visit | Attending: Gastroenterology | Admitting: Gastroenterology

## 2015-03-19 ENCOUNTER — Encounter: Payer: Self-pay | Admitting: Dietician

## 2015-03-19 ENCOUNTER — Encounter (HOSPITAL_COMMUNITY): Payer: Self-pay

## 2015-03-19 NOTE — Progress Notes (Signed)
Daughter had called about Ensure case for pt  Wt Readings from Last 10 Encounters:  03/11/15 79 lb 9.6 oz (36.106 kg)  03/04/15 80 lb 9.6 oz (36.56 kg)  02/25/15 79 lb 11.2 oz (36.152 kg)  02/04/15 82 lb (37.195 kg)  01/14/15 81 lb (36.741 kg)  12/24/14 78 lb (35.381 kg)  12/16/14 78 lb (35.381 kg)  12/05/14 75 lb 9.6 oz (34.292 kg)  12/04/14 77 lb 1.6 oz (34.972 kg)  11/25/14 77 lb 6.4 oz (35.108 kg)   Daughter requested [place] Ensure Case available for Pt. Her next [facility] appt is the May 4th.   This is the patient's third case and let her know that pt will no longer be eligible for free cases after this.   Pt's daughter says she understood.   [name] RD, LDN Nutrition Pager: [phone] 03/19/2015 10:56 AM

## 2015-03-24 ENCOUNTER — Ambulatory Visit (HOSPITAL_COMMUNITY)
Admission: RE | Admit: 2015-03-24 | Discharge: 2015-03-24 | Disposition: A | Discharge status: Home / Self Care | Payer: Medicaid Other | Source: Ambulatory Visit | Attending: Gastroenterology | Admitting: Gastroenterology

## 2015-03-24 ENCOUNTER — Encounter (HOSPITAL_COMMUNITY)
Admission: RE | Disposition: A | Discharge status: Home / Self Care | Payer: Self-pay | Source: Ambulatory Visit | Attending: Gastroenterology

## 2015-03-24 ENCOUNTER — Encounter (HOSPITAL_COMMUNITY): Payer: Self-pay | Admitting: Anesthesiology

## 2015-03-24 ENCOUNTER — Encounter (HOSPITAL_COMMUNITY): Payer: Self-pay | Admitting: *Deleted

## 2015-03-24 DIAGNOSIS — Z1211 Encounter for screening for malignant neoplasm of colon: Secondary | ICD-10-CM | POA: Diagnosis present

## 2015-03-24 DIAGNOSIS — Z79899 Other long term (current) drug therapy: Secondary | ICD-10-CM | POA: Diagnosis not present

## 2015-03-24 DIAGNOSIS — C3411 Malignant neoplasm of upper lobe, right bronchus or lung: Secondary | ICD-10-CM | POA: Diagnosis not present

## 2015-03-24 DIAGNOSIS — E118 Type 2 diabetes mellitus with unspecified complications: Secondary | ICD-10-CM | POA: Insufficient documentation

## 2015-03-24 DIAGNOSIS — Z5309 Procedure and treatment not carried out because of other contraindication: Secondary | ICD-10-CM | POA: Diagnosis not present

## 2015-03-24 HISTORY — DX: Malignant (primary) neoplasm, unspecified: C80.1

## 2015-03-24 LAB — GLUCOSE, CAPILLARY
Glucose-Capillary: 64 mg/dL — ABNORMAL LOW (ref 70–99)
Glucose-Capillary: 154 mg/dL — ABNORMAL HIGH (ref 70–99)

## 2015-03-24 SURGERY — CANCELLED PROCEDURE

## 2015-03-24 MED ORDER — MIDAZOLAM HCL 2 MG/2ML IJ SOLN
INTRAMUSCULAR | Status: AC
  Filled 2015-03-24: qty 2

## 2015-03-24 MED ORDER — FENTANYL CITRATE (PF) 100 MCG/2ML IJ SOLN
INTRAMUSCULAR | Status: AC
  Filled 2015-03-24: qty 2

## 2015-03-24 MED ORDER — DEXTROSE 50 % IV SOLN
12.5000 g | Freq: Once | INTRAVENOUS | Status: AC
  Administered 2015-03-24: 12.5 g via INTRAVENOUS

## 2015-03-24 MED ORDER — DEXTROSE 50 % IV SOLN
INTRAVENOUS | Status: AC
  Filled 2015-03-24: qty 50

## 2015-03-24 MED ORDER — LACTATED RINGERS IV SOLN: INTRAVENOUS | Status: DC

## 2015-03-24 MED ORDER — MIDAZOLAM HCL 2 MG/2ML IJ SOLN: 1.0000 mg | INTRAMUSCULAR | Status: DC | PRN

## 2015-03-24 MED ORDER — FENTANYL CITRATE (PF) 100 MCG/2ML IJ SOLN: 25.0000 ug | Freq: Once | INTRAMUSCULAR | Status: DC

## 2015-03-24 SURGICAL SUPPLY — 23 items
ELECT REM PT RETURN 9FT ADLT (ELECTROSURGICAL)
ELECTRODE REM PT RTRN 9FT ADLT (ELECTROSURGICAL) IMPLANT
FCP BXJMBJMB 240X2.8X (CUTTING FORCEPS)
FLOOR PAD 36X40 (MISCELLANEOUS)
FORCEPS BIOP RAD 4 LRG CAP 4 (CUTTING FORCEPS) IMPLANT
FORCEPS BIOP RJ4 240 W/NDL (CUTTING FORCEPS)
FORCEPS BXJMBJMB 240X2.8X (CUTTING FORCEPS) IMPLANT
FORMALIN 10 PREFIL 20ML (MISCELLANEOUS) IMPLANT
INJECTOR/SNARE I SNARE (MISCELLANEOUS) IMPLANT
KIT CLEAN ENDO COMPLIANCE (KITS) IMPLANT
LUBRICANT JELLY 4.5OZ STERILE (MISCELLANEOUS) IMPLANT
MANIFOLD NEPTUNE II (INSTRUMENTS) IMPLANT
NEEDLE SCLEROTHERAPY 25GX240 (NEEDLE) IMPLANT
PAD FLOOR 36X40 (MISCELLANEOUS) IMPLANT
PROBE APC STR FIRE (PROBE) IMPLANT
PROBE INJECTION GOLD (MISCELLANEOUS)
PROBE INJECTION GOLD 7FR (MISCELLANEOUS) IMPLANT
SNARE SHORT THROW 13M SML OVAL (MISCELLANEOUS) ×1 IMPLANT
SYR 50ML LL SCALE MARK (SYRINGE) IMPLANT
TRAP SPECIMEN MUCOUS 40CC (MISCELLANEOUS) IMPLANT
TUBING INSUFFLATOR CO2MPACT (TUBING) IMPLANT
TUBING IRRIGATION ENDOGATOR (MISCELLANEOUS) IMPLANT
WATER STERILE IRR 1000ML POUR (IV SOLUTION) IMPLANT

## 2015-03-24 NOTE — Anesthesia Preprocedure Evaluation (Signed)
Anesthesia Evaluation  Patient identified by MRN, date of birth, ID band Patient awake    Reviewed: Allergy & Precautions, NPO status , Patient's Chart, lab work & pertinent test results  Airway Mallampati: II  TM Distance: >3 FB     Dental  (+) Teeth Intact   Pulmonary Current Smoker,  Lung Cancer - RUL breath sounds clear to auscultation        Cardiovascular hypertension, Pt. on medications Rhythm:Regular Rate:Normal     Neuro/Psych    GI/Hepatic GERD-  Controlled and Medicated,  Endo/Other  diabetes  Renal/GU Renal disease     Musculoskeletal   Abdominal   Peds  Hematology   Anesthesia Other Findings   Reproductive/Obstetrics                             Anesthesia Physical Anesthesia Plan  ASA: III  Anesthesia Plan: MAC   Post-op Pain Management:    Induction: Intravenous  Airway Management Planned: Simple Face Mask  Additional Equipment:   Intra-op Plan:   Post-operative Plan:   Informed Consent: I have reviewed the patients History and Physical, chart, labs and discussed the procedure including the risks, benefits and alternatives for the proposed anesthesia with the patient or authorized representative who has indicated his/her understanding and acceptance.     Plan Discussed with:   Anesthesia Plan Comments:         Anesthesia Quick Evaluation

## 2015-03-24 NOTE — Progress Notes (Addendum)
REVIEWED-PT SEEN IN PREOP MAY 3. DISCUSSED CARE WITH TOM KEFALAS, PA-c. PT CURRENTLY DENIES ABDOMINAL PAIN, BRBPR, OR CHANGE IN BOWEL HABITS. PT HAS STAGE IV LUNG CA THAT IS PROGRESSING. EXPLAINED TO PT AT THIS POINT THE BENEFITS OF TCS DO NOT OUTWEIGH THE RISKS. RECOMMENDED PT HAVE OPV IN 6 MOS E30 SCREENING COLONOSCOPY/DYSPHAGIA AND RE CAN RE-VISIT BENEFITS V. RISKS OF SCREENING COLONOSCOPY. COLONOSCOPY CANCELED FOR TODAY.

## 2015-03-24 NOTE — H&P (Signed)
PT SEEN IN PREOP. DISCUSSED CARE WITH TOM KEFALAS, PA-PT CURRENTLY DENIES ABDOMINAL PAIN, BRBPR, OR CHANGE IN BOWEL HABITS. PT HAS STAGE IV LUNG CA THAT IS PROGRESSING. EXPLAINED TO PT AT THIS POINT THE BENEFITS OF TCS DO NOT OUTWEIGH THE RISKS. RECOMMENDED PT HAVE OPV IN 6 MOS AND RE CAN RE-VISIT BENEFITS V. RISKS OF SCREENING COLONOSCOPY.   COLONOSCOPY CANCELED FOR TODAY.

## 2015-03-24 NOTE — Progress Notes (Addendum)
[name], stage IV, bilateral adrenal metastases, retroperitoneal lymph nodes  Biopsy on 08/01/2014 c/w poorly differentiated adenocarcinoma  XRT completed on 10/27/2014  Right Brachial plexus neuralgia  EGFR mutation negative ALK mutation negative  Extensive soft tissue thickening in the posterior nasopharynx with bilateral level IIa lymphadenopathy in the neck. The possibility of a second primary neoplasm in this region warrants consideration. Clinical correlation is recommended.  CURRENT THERAPY: Nivolumab/zometa  INTERVAL HISTORY: Melissa Sullivan 59 y.o. female returns for follow-up of stage IV adenocarcinoma of the lung. She is doing well. She believes her appetite is better and her weight is up some.  She sleeps ok. She denies any pain. Her mood is good, she states " I haven't been mean."  She has no other major complaints. Her coughing is better. Her abdominal pain is better.  MEDICAL HISTORY: Past Medical History  Diagnosis Date  . Hypertension   . Reflux   . Hyperlipidemia   . Lung mass 07/28/2014  . Adrenal mass, right 07/28/2014  . Diabetes mellitus without complication   . Cancer     lung  right    has Adrenal mass, right; Cancer of upper lobe of right lung; ARF (acute renal failure); Abnormal finding on imaging; Oropharyngeal dysphagia; Nausea and vomiting; Hypercalcemia; Anemia in neoplastic disease; Dysphagia; and Encounter for screening colonoscopy on her problem list.      Cancer of upper lobe of right lung   07/28/2014 Imaging CT chest: Large R apical mass consistent with malignancy. This is destroying the R 2nd rib with extension into adjacent soft tissue. R hilar adenopathy with R 5cm adrenal metastatic lesion.   08/01/2014 Initial Biopsy Lung, needle/core biopsy(ies), right upper lobe - POORLY DIFFERENTIATED ADENOCARCINOMA, SEE COMMENT.   08/08/2014 PET scan Large hypermetabolic R apical mass with evidence of direct chest wall and mediastinal  invasion, right retrocrural lymphadenopathy, extensive retroperitoneal lymphadenopathy, and metastatic lesions to the adrenal glands    09/02/2014 - 11/04/2014 Chemotherapy Cisplatin/Pemetrexed/Avastin every 21 days x 4 cycles   10/07/2014 - 10/27/2014 Radiation Therapy Right lung apex for control of brachioplexopathy.   12/24/2014 - 02/25/2015 Chemotherapy Alimta/Avastin every 21 days.   02/20/2015 Imaging Increase in size of right adrenal metastasis and subjacent confluent retrocaval lymphadenopathy   02/25/2015 -  Chemotherapy Nivolumab, zometa     has No Known Allergies.  Melissa Sullivan does not currently have medications on file.  SURGICAL HISTORY: Past Surgical History  Procedure Laterality Date  . Appendectomy    . Lung biopsy Right 07/2014    CT guided  . Portacath placement Left 09/01/2014  . Esophagogastroduodenoscopy N/A 12/09/2014    YPP:JKDTOI esophageal stricture/mild-to-noderate erosive gastritis. negative H.pylori  . Savory dilation N/A 12/09/2014    Procedure: SAVORY DILATION;  Surgeon: [name], MD;  Location: [facility];  Service: Endoscopy;  Laterality: N/A;  [name] dilation N/A 12/09/2014    Procedure: [name] DILATION;  Surgeon: [name], MD;  Location: [facility];  Service: Endoscopy;  Laterality: N/A;  . Flexible sigmoidoscopy  2011    Dr. [name]: hyperplastic polyp    SOCIAL HISTORY: History   Social History  . Marital Status: Married    Spouse Name: N/A  . Number of Children: N/A  . Years of Education: N/A   Occupational History  . Not on file.   Social History Main Topics  . Smoking status: Light Tobacco Smoker -- 0.30 packs/day    Types: Cigarettes  . Smokeless tobacco: Never Used  .  Alcohol Use: No  . Drug Use: No  . Sexual Activity: Not on file   Other Topics Concern  . Not on file   Social History Narrative    FAMILY HISTORY: Family History  Problem Relation Age of Onset  . Cancer Sister     Review of Systems    Constitutional: Positive for fatigue. Negative for fever and chills.  HENT: Negative for congestion, hearing loss, nosebleeds, sore throat and tinnitus.   Eyes: Negative for blurred vision, double vision, pain and discharge.  Respiratory: Negative for cough, hemoptysis, sputum production, shortness of breath and wheezing.   Cardiovascular: Negative for chest pain, palpitations, claudication, leg swelling and PND.  Gastrointestinal: . Negative for heartburn, nausea, vomiting, diarrhea, constipation, blood in stool and melena.  Genitourinary: Negative for dysuria, urgency, frequency and hematuria.  Musculoskeletal: Negative for myalgias, joint pain and falls.  Skin: Negative for itching and rash.  Neurological: Negative for dizziness, tingling, tremors, sensory change, speech change, focal weakness, seizures, loss of consciousness, weakness and headaches.  Endo/Heme/Allergies: Does not bruise/bleed easily.  Psychiatric/Behavioral: Negative for depression, suicidal ideas, memory loss and substance abuse. The patient is not nervous/anxious and does not have insomnia.     PHYSICAL EXAMINATION  ECOG PERFORMANCE STATUS: 1 - Symptomatic but completely ambulatory  Filed Vitals:   03/25/15 0917  BP: 136/90  Pulse: 84  Temp: 98.5 F (36.9 C)  Resp: 16    Physical Exam  Constitutional: She is oriented to person, place, and time.  HENT:  Head: Normocephalic and atraumatic.  Nose: Nose normal.  Mouth/Throat: Oropharynx is clear and moist. No oropharyngeal exudate.  Eyes: Conjunctivae and EOM are normal. Pupils are equal, round, and reactive to light. Right eye exhibits no discharge. Left eye exhibits no discharge. No scleral icterus.  Neck: Normal range of motion. Neck supple. No tracheal deviation present. No thyromegaly present.  Cardiovascular: Normal rate, regular rhythm and normal heart sounds.  Exam reveals no gallop and no friction rub.   No murmur heard. Pulmonary/Chest: Effort  normal and breath sounds normal. She has no wheezes. She has no rales.  Abdominal: Soft. Bowel sounds are normal. She exhibits no distension and no mass. There is no tenderness. There is no rebound and no guarding.  Thin  Musculoskeletal: Normal range of motion. She exhibits no edema.  Lymphadenopathy:    She has no cervical adenopathy.  Neurological: She is alert and oriented to person, place, and time. She has normal reflexes. No cranial nerve deficit. Gait normal. Coordination normal.  Skin: Skin is warm and dry. No rash noted.  Psychiatric: Mood, memory, affect and judgment normal.  Nursing note and vitals reviewed.   LABORATORY DATA: CBC    Component Value Date/Time   WBC 7.7 03/25/2015 0930   RBC 3.36* 03/25/2015 0930   RBC 2.45* 01/15/2015 0901   HGB 10.3* 03/25/2015 0930   HCT 32.3* 03/25/2015 0930   PLT 241 03/25/2015 0930   MCV 96.1 03/25/2015 0930   MCH 30.7 03/25/2015 0930   MCHC 31.9 03/25/2015 0930   RDW 19.8* 03/25/2015 0930   LYMPHSABS 1.9 03/25/2015 0930   MONOABS 0.9 03/25/2015 0930   EOSABS 0.5 03/25/2015 0930   BASOSABS 0.0 03/25/2015 0930   CMP     Component Value Date/Time   NA 136 03/25/2015 0930   K 3.7 03/25/2015 0930   CL 102 03/25/2015 0930   CO2 25 03/25/2015 0930   GLUCOSE 103* 03/25/2015 0930   BUN 17 03/25/2015 0930   CREATININE  1.24* 03/25/2015 0930   CALCIUM 10.1 03/25/2015 0930   PROT 8.3* 03/25/2015 0930   ALBUMIN 3.4* 03/25/2015 0930   AST 26 03/25/2015 0930   ALT 11* 03/25/2015 0930   ALKPHOS 68 03/25/2015 0930   BILITOT 0.4 03/25/2015 0930   GFRNONAA 47* 03/25/2015 0930   GFRAA 54* 03/25/2015 0930   CLINICAL DATA: Right upper lobe stage IV non-small cell lung cancer. Ongoing chemotherapy. Adrenal mass on prior exam.  EXAM: 02/20/2015 CT CHEST, ABDOMEN, AND PELVIS WITH CONTRAST  IMPRESSION: No significant interval change in right upper lobe mass compared to prior exam.  Progression of upper thoracic and right apical  rib sclerosis which may indicate treatment effect on osseous metastatic disease. Benign radiation change is possible but felt less likely.  Areas of inspissated intrabronchial secretions with adjacent tree-in-bud type pulmonary parenchymal nodularity likely indicating small airways infection reidentified.  Increase in size of right adrenal metastasis and subjacent confluent retrocaval lymphadenopathy.   Electronically Signed  By: Conchita Paris M.D.  On: 02/20/2015 12:47    ASSESSMENT and THERAPY PLAN:   Stage IV adenocarcinoma of the lung, EGFR and ALK negative  We will continue with current therapy and is tolerating it well. Her weight is up a few pounds. She has absolutely no major complaints today. Again reviewed the potential side effects of her current treatment. We did call her in prednisone to have at home should she develop any potential immune therapy mediated symptoms; including diarrhea, cough, shortness of breath, rash.  Hypercalcemia/bony metastatic disease  She will continue with Zometa as prescribed.  We will plan on a two week follow-up with laboratory studies, chemotherapy and an office visit.  All questions were answered. The patient knows to call the clinic with any problems, questions or concerns. We can certainly see the patient much sooner if necessary.  This document serves as a record of services personally performed by Ancil Linsey, MD. It was created on her behalf by Arlyce Harman, a trained medical scribe. The creation of this record is based on the scribe's personal observations and the provider's statements to them. This document has been checked and approved by the attending provider. This note was signed electronically I have reviewed the above documentation for accuracy and completeness and I agree with the above.  Ancil Linsey, MD

## 2015-03-24 NOTE — Progress Notes (Signed)
On recall for 6 month fu ov

## 2015-03-25 ENCOUNTER — Encounter (HOSPITAL_COMMUNITY): Payer: Self-pay | Admitting: Hematology & Oncology

## 2015-03-25 ENCOUNTER — Encounter (HOSPITAL_COMMUNITY): Payer: Medicaid Other | Attending: Hematology and Oncology

## 2015-03-25 ENCOUNTER — Encounter (HOSPITAL_BASED_OUTPATIENT_CLINIC_OR_DEPARTMENT_OTHER): Payer: Medicaid Other | Admitting: Hematology & Oncology

## 2015-03-25 VITALS — BP 136/90 | HR 84 | Temp 98.5°F | Resp 16 | Wt 81.7 lb

## 2015-03-25 VITALS — BP 114/71 | HR 86 | Temp 98.2°F | Resp 20

## 2015-03-25 DIAGNOSIS — Z5112 Encounter for antineoplastic immunotherapy: Secondary | ICD-10-CM

## 2015-03-25 DIAGNOSIS — J449 Chronic obstructive pulmonary disease, unspecified: Secondary | ICD-10-CM | POA: Insufficient documentation

## 2015-03-25 DIAGNOSIS — C3490 Malignant neoplasm of unspecified part of unspecified bronchus or lung: Secondary | ICD-10-CM

## 2015-03-25 DIAGNOSIS — E785 Hyperlipidemia, unspecified: Secondary | ICD-10-CM | POA: Diagnosis not present

## 2015-03-25 DIAGNOSIS — C781 Secondary malignant neoplasm of mediastinum: Secondary | ICD-10-CM | POA: Diagnosis not present

## 2015-03-25 DIAGNOSIS — C7971 Secondary malignant neoplasm of right adrenal gland: Secondary | ICD-10-CM

## 2015-03-25 DIAGNOSIS — C3411 Malignant neoplasm of upper lobe, right bronchus or lung: Secondary | ICD-10-CM

## 2015-03-25 DIAGNOSIS — F17210 Nicotine dependence, cigarettes, uncomplicated: Secondary | ICD-10-CM | POA: Insufficient documentation

## 2015-03-25 DIAGNOSIS — Z9089 Acquired absence of other organs: Secondary | ICD-10-CM | POA: Diagnosis not present

## 2015-03-25 DIAGNOSIS — C7989 Secondary malignant neoplasm of other specified sites: Secondary | ICD-10-CM | POA: Insufficient documentation

## 2015-03-25 DIAGNOSIS — K219 Gastro-esophageal reflux disease without esophagitis: Secondary | ICD-10-CM | POA: Diagnosis not present

## 2015-03-25 DIAGNOSIS — C7972 Secondary malignant neoplasm of left adrenal gland: Secondary | ICD-10-CM | POA: Insufficient documentation

## 2015-03-25 DIAGNOSIS — Z79899 Other long term (current) drug therapy: Secondary | ICD-10-CM | POA: Insufficient documentation

## 2015-03-25 DIAGNOSIS — C772 Secondary and unspecified malignant neoplasm of intra-abdominal lymph nodes: Secondary | ICD-10-CM | POA: Insufficient documentation

## 2015-03-25 DIAGNOSIS — I10 Essential (primary) hypertension: Secondary | ICD-10-CM | POA: Insufficient documentation

## 2015-03-25 DIAGNOSIS — E278 Other specified disorders of adrenal gland: Secondary | ICD-10-CM

## 2015-03-25 LAB — COMPREHENSIVE METABOLIC PANEL
ALK PHOS: 68 U/L (ref 38–126)
ALT: 11 U/L — ABNORMAL LOW (ref 14–54)
ANION GAP: 9 (ref 5–15)
AST: 26 U/L (ref 15–41)
Albumin: 3.4 g/dL — ABNORMAL LOW (ref 3.5–5.0)
BILIRUBIN TOTAL: 0.4 mg/dL (ref 0.3–1.2)
BUN: 17 mg/dL (ref 6–20)
CHLORIDE: 102 mmol/L (ref 101–111)
CO2: 25 mmol/L (ref 22–32)
Calcium: 10.1 mg/dL (ref 8.9–10.3)
Creatinine, Ser: 1.24 mg/dL — ABNORMAL HIGH (ref 0.44–1.00)
GFR calc non Af Amer: 47 mL/min — ABNORMAL LOW (ref >60)
GFR, EST AFRICAN AMERICAN: 54 mL/min — AB (ref >60)
Glucose, Bld: 103 mg/dL — ABNORMAL HIGH (ref 70–99)
Potassium: 3.7 mmol/L (ref 3.5–5.1)
SODIUM: 136 mmol/L (ref 135–145)
Total Protein: 8.3 g/dL — ABNORMAL HIGH (ref 6.5–8.1)

## 2015-03-25 LAB — CBC WITH DIFFERENTIAL/PLATELET
BASOS ABS: 0 10*3/uL (ref 0.0–0.1)
Basophils Relative: 0 % (ref 0–1)
EOS ABS: 0.5 10*3/uL (ref 0.0–0.7)
Eosinophils Relative: 7 % — ABNORMAL HIGH (ref 0–5)
HCT: 32.3 % — ABNORMAL LOW (ref 36.0–46.0)
Hemoglobin: 10.3 g/dL — ABNORMAL LOW (ref 12.0–15.0)
Lymphocytes Relative: 24 % (ref 12–46)
Lymphs Abs: 1.9 10*3/uL (ref 0.7–4.0)
MCH: 30.7 pg (ref 26.0–34.0)
MCHC: 31.9 g/dL (ref 30.0–36.0)
MCV: 96.1 fL (ref 78.0–100.0)
Monocytes Absolute: 0.9 10*3/uL (ref 0.1–1.0)
Monocytes Relative: 12 % (ref 3–12)
NEUTROS PCT: 57 % (ref 43–77)
Neutro Abs: 4.4 10*3/uL (ref 1.7–7.7)
PLATELETS: 241 10*3/uL (ref 150–400)
RBC: 3.36 MIL/uL — ABNORMAL LOW (ref 3.87–5.11)
RDW: 19.8 % — AB (ref 11.5–15.5)
WBC: 7.7 10*3/uL (ref 4.0–10.5)

## 2015-03-25 MED ORDER — HEPARIN SOD (PORK) LOCK FLUSH 100 UNIT/ML IV SOLN
INTRAVENOUS | Status: AC
  Filled 2015-03-25: qty 5

## 2015-03-25 MED ORDER — NIVOLUMAB CHEMO INJECTION 100 MG/10ML
3.0000 mg/kg | Freq: Once | INTRAVENOUS | Status: AC
  Administered 2015-03-25: 110 mg via INTRAVENOUS
  Filled 2015-03-25: qty 11

## 2015-03-25 MED ORDER — HEPARIN SOD (PORK) LOCK FLUSH 100 UNIT/ML IV SOLN
500.0000 [IU] | Freq: Once | INTRAVENOUS | Status: AC | PRN
  Administered 2015-03-25: 500 [IU]

## 2015-03-25 MED ORDER — SODIUM CHLORIDE 0.9 % IJ SOLN: 10.0000 mL | INTRAMUSCULAR | Status: DC | PRN

## 2015-03-25 MED ORDER — SODIUM CHLORIDE 0.9 % IV SOLN
Freq: Once | INTRAVENOUS | Status: AC
  Administered 2015-03-25: 11:00:00 via INTRAVENOUS

## 2015-03-25 MED ORDER — ZOLEDRONIC ACID 4 MG/5ML IV CONC
3.5000 mg | Freq: Once | INTRAVENOUS | Status: AC
  Administered 2015-03-25: 3.5 mg via INTRAVENOUS
  Filled 2015-03-25: qty 4.38

## 2015-03-25 NOTE — Patient Instructions (Signed)
..  [place] at [facility] Discharge Instructions  RECOMMENDATIONS MADE BY THE CONSULTANT AND ANY TEST RESULTS WILL BE SENT TO YOUR REFERRING PHYSICIAN.  Exam and discussion with Dr. Whitney Muse. Continue with treatment plan as scheduled.   Follow up appointment with PA in weeks. Call for any new or worsening symptoms, questions, or concerns.   Thank you for choosing [place] at [facility] to provide your oncology and hematology care.  To afford each patient quality time with our provider, please arrive at least 15 minutes before your scheduled appointment time.    You need to re-schedule your appointment should you arrive 10 or more minutes late.  We strive to give you quality time with our providers, and arriving late affects you and other patients whose appointments are after yours.  Also, if you no show three or more times for appointments you may be dismissed from the clinic at the providers discretion.     Again, thank you for choosing [facility].  Our hope is that these requests will decrease the amount of time that you wait before being seen by our physicians.       _____________________________________________________________  Should you have questions after your visit to [facility], please contact our office at [phone] between the hours of 8:30 a.m. and 4:30 p.m.  Voicemails left after 4:30 p.m. will not be returned until the following business day.  For prescription refill requests, have your pharmacy contact our office.

## 2015-03-25 NOTE — Patient Instructions (Signed)
[facility] Discharge Instructions for Patients Receiving Chemotherapy  Today you received the following chemotherapy agents zometa and opdivo Please call the clinic if you have any questions or concerns  To help prevent nausea and vomiting after your treatment, we encourage you to take your nausea medication .   If you develop nausea and vomiting, or diarrhea that is not controlled by your medication, call the clinic.  The clinic phone number is [phone]. Office hours are Monday-Friday 8:30am-5:00pm.  BELOW ARE SYMPTOMS THAT SHOULD BE REPORTED IMMEDIATELY:  *FEVER GREATER THAN 101.0 F  *CHILLS WITH OR WITHOUT FEVER  NAUSEA AND VOMITING THAT IS NOT CONTROLLED WITH YOUR NAUSEA MEDICATION  *UNUSUAL SHORTNESS OF BREATH  *UNUSUAL BRUISING OR BLEEDING  TENDERNESS IN MOUTH AND THROAT WITH OR WITHOUT PRESENCE OF ULCERS  *URINARY PROBLEMS  *BOWEL PROBLEMS  UNUSUAL RASH Items with * indicate a potential emergency and should be followed up as soon as possible. If you have an emergency after office hours please contact your primary care physician or go to the nearest emergency department.  Please call the clinic during office hours if you have any questions or concerns.   You may also contact the Patient Navigator at [phone] should you have any questions or need assistance in obtaining follow up care. _____________________________________________________________________ Have you asked about our STAR program?    STAR stands for Survivorship Training and Rehabilitation, and this is a nationally recognized cancer care program that focuses on survivorship and rehabilitation.  Cancer and cancer treatments may cause problems, such as, pain, making you feel tired and keeping you from doing the things that you need or want to do. Cancer rehabilitation can help. Our goal is to reduce these troubling effects and help you have the best quality of life possible.  You may  receive a survey from a nurse that asks questions about your current state of health.  Based on the survey results, all eligible patients will be referred to the [facility] program for an evaluation so we can better serve you! A frequently asked questions sheet is available upon request.

## 2015-03-25 NOTE — Progress Notes (Signed)
Melissa Sullivan Tolerated chemotherapy and zometa infusion today Discharged ambulatory

## 2015-04-06 ENCOUNTER — Other Ambulatory Visit (HOSPITAL_COMMUNITY): Payer: Self-pay | Admitting: Oncology

## 2015-04-06 DIAGNOSIS — C3411 Malignant neoplasm of upper lobe, right bronchus or lung: Secondary | ICD-10-CM

## 2015-04-06 MED ORDER — METFORMIN HCL 500 MG PO TABS: 500.0000 mg | ORAL_TABLET | Freq: Two times a day (BID) | ORAL | Status: DC

## 2015-04-06 MED ORDER — OXYCODONE HCL 10 MG PO TABS: 10.0000 mg | ORAL_TABLET | Freq: Four times a day (QID) | ORAL | Status: DC | PRN

## 2015-04-08 ENCOUNTER — Encounter (HOSPITAL_BASED_OUTPATIENT_CLINIC_OR_DEPARTMENT_OTHER): Payer: Medicaid Other

## 2015-04-08 ENCOUNTER — Encounter: Payer: Self-pay | Admitting: Dietician

## 2015-04-08 ENCOUNTER — Encounter: Payer: Self-pay | Admitting: *Deleted

## 2015-04-08 ENCOUNTER — Encounter (HOSPITAL_COMMUNITY): Payer: Self-pay | Admitting: Oncology

## 2015-04-08 ENCOUNTER — Encounter (HOSPITAL_BASED_OUTPATIENT_CLINIC_OR_DEPARTMENT_OTHER): Payer: Medicaid Other | Admitting: Oncology

## 2015-04-08 VITALS — BP 107/67 | HR 74 | Temp 98.1°F | Resp 16

## 2015-04-08 VITALS — BP 121/83 | HR 89 | Temp 97.7°F | Resp 18 | Wt 78.8 lb

## 2015-04-08 DIAGNOSIS — Z5112 Encounter for antineoplastic immunotherapy: Secondary | ICD-10-CM | POA: Diagnosis not present

## 2015-04-08 DIAGNOSIS — C7971 Secondary malignant neoplasm of right adrenal gland: Secondary | ICD-10-CM | POA: Diagnosis not present

## 2015-04-08 DIAGNOSIS — E278 Other specified disorders of adrenal gland: Secondary | ICD-10-CM

## 2015-04-08 DIAGNOSIS — E876 Hypokalemia: Secondary | ICD-10-CM

## 2015-04-08 DIAGNOSIS — C7951 Secondary malignant neoplasm of bone: Secondary | ICD-10-CM

## 2015-04-08 DIAGNOSIS — C3411 Malignant neoplasm of upper lobe, right bronchus or lung: Secondary | ICD-10-CM | POA: Diagnosis not present

## 2015-04-08 LAB — TSH: TSH: 3.008 u[IU]/mL (ref 0.350–4.500)

## 2015-04-08 LAB — CBC WITH DIFFERENTIAL/PLATELET
Basophils Absolute: 0 10*3/uL (ref 0.0–0.1)
Basophils Relative: 0 % (ref 0–1)
Eosinophils Absolute: 0.6 10*3/uL (ref 0.0–0.7)
Eosinophils Relative: 7 % — ABNORMAL HIGH (ref 0–5)
HEMATOCRIT: 34 % — AB (ref 36.0–46.0)
Hemoglobin: 10.8 g/dL — ABNORMAL LOW (ref 12.0–15.0)
LYMPHS ABS: 2.4 10*3/uL (ref 0.7–4.0)
LYMPHS PCT: 29 % (ref 12–46)
MCH: 30.4 pg (ref 26.0–34.0)
MCHC: 31.8 g/dL (ref 30.0–36.0)
MCV: 95.8 fL (ref 78.0–100.0)
MONO ABS: 0.9 10*3/uL (ref 0.1–1.0)
Monocytes Relative: 11 % (ref 3–12)
Neutro Abs: 4.4 10*3/uL (ref 1.7–7.7)
Neutrophils Relative %: 53 % (ref 43–77)
Platelets: 280 10*3/uL (ref 150–400)
RBC: 3.55 MIL/uL — ABNORMAL LOW (ref 3.87–5.11)
RDW: 19.6 % — ABNORMAL HIGH (ref 11.5–15.5)
WBC: 8.4 10*3/uL (ref 4.0–10.5)

## 2015-04-08 LAB — COMPREHENSIVE METABOLIC PANEL
ALK PHOS: 79 U/L (ref 38–126)
ALT: 13 U/L — AB (ref 14–54)
AST: 23 U/L (ref 15–41)
Albumin: 3.9 g/dL (ref 3.5–5.0)
Anion gap: 6 (ref 5–15)
BUN: 19 mg/dL (ref 6–20)
CO2: 24 mmol/L (ref 22–32)
Calcium: 10.3 mg/dL (ref 8.9–10.3)
Chloride: 103 mmol/L (ref 101–111)
Creatinine, Ser: 1.46 mg/dL — ABNORMAL HIGH (ref 0.44–1.00)
GFR, EST AFRICAN AMERICAN: 45 mL/min — AB (ref >60)
GFR, EST NON AFRICAN AMERICAN: 39 mL/min — AB (ref >60)
GLUCOSE: 87 mg/dL (ref 65–99)
POTASSIUM: 5.1 mmol/L (ref 3.5–5.1)
SODIUM: 133 mmol/L — AB (ref 135–145)
Total Bilirubin: 0.3 mg/dL (ref 0.3–1.2)
Total Protein: 8.8 g/dL — ABNORMAL HIGH (ref 6.5–8.1)

## 2015-04-08 MED ORDER — NIVOLUMAB CHEMO INJECTION 100 MG/10ML
3.0000 mg/kg | Freq: Once | INTRAVENOUS | Status: AC
  Administered 2015-04-08: 110 mg via INTRAVENOUS
  Filled 2015-04-08: qty 1

## 2015-04-08 MED ORDER — POTASSIUM CHLORIDE CRYS ER 20 MEQ PO TBCR: 20.0000 meq | EXTENDED_RELEASE_TABLET | Freq: Every day | ORAL | Status: DC

## 2015-04-08 MED ORDER — HEPARIN SOD (PORK) LOCK FLUSH 100 UNIT/ML IV SOLN
500.0000 [IU] | Freq: Once | INTRAVENOUS | Status: AC | PRN
  Administered 2015-04-08: 500 [IU]
  Filled 2015-04-08: qty 5

## 2015-04-08 MED ORDER — SODIUM CHLORIDE 0.9 % IJ SOLN
10.0000 mL | INTRAMUSCULAR | Status: DC | PRN
  Administered 2015-04-08: 10 mL
  Filled 2015-04-08: qty 10

## 2015-04-08 MED ORDER — SODIUM CHLORIDE 0.9 % IV SOLN
Freq: Once | INTRAVENOUS | Status: AC
  Administered 2015-04-08: 11:00:00 via INTRAVENOUS

## 2015-04-08 NOTE — Progress Notes (Signed)
Asked by social worker to visit patient in regards to patients food insecurity.  Wt Readings from Last 10 Encounters:  04/08/15 78 lb 12.8 oz (35.743 kg)  03/25/15 81 lb 11.2 oz (37.059 kg)  03/24/15 80 lb (36.288 kg)  03/11/15 79 lb 9.6 oz (36.106 kg)  03/04/15 80 lb 9.6 oz (36.56 kg)  02/25/15 79 lb 11.2 oz (36.152 kg)  02/04/15 82 lb (37.195 kg)  01/14/15 81 lb (36.741 kg)  12/24/14 78 lb (35.381 kg)  12/16/14 78 lb (35.381 kg)   Pt has lost about 3 lbs in 2 weeks, but her weight is even the past 4 months.   She is continuing to use Ensure supplements Daily. Unfortunately she has already received her 3 free cases from [facility]. The social worker directed her to an [place] assistance program she found online.  Patient reports oral intake as fair, but is suffering from a sporadic appetite. She reports that on some days she "eats great", but on other days she doesn't have much of an appetite.  Discussed with pt that she should save the Ensure supplements for days she doesn't have an appetite. She should also try other high cal/protein options. She already does eat many: eggs, cheese, peanut butter etc.   Let her knew I was going to reach out to the RD at the [facility] to try to find out about any assistance programs.  [name] RD, LDN Nutrition Pager: [phone] 04/08/2015 11:59 AM

## 2015-04-08 NOTE — Progress Notes (Addendum)
Melissa Sullivan, [place] [id number]  Cancer of upper lobe of right lung - Plan: CT Abdomen Pelvis W Contrast, CT Chest W Contrast  Hypokalemia - Plan: potassium chloride SA (K-DUR,KLOR-CON) 20 MEQ tablet, DISCONTINUED: potassium chloride SA (K-DUR,KLOR-CON) 20 MEQ tablet  CURRENT THERAPY: Nivolumab/zometa  INTERVAL HISTORY: Melissa Sullivan 59 y.o. female returns for followup of stage IV adenocarcinoma of the lung.    Cancer of upper lobe of right lung   07/28/2014 Imaging CT chest: Large R apical mass consistent with malignancy. This is destroying the R 2nd rib with extension into adjacent soft tissue. R hilar adenopathy with R 5cm adrenal metastatic lesion.   08/01/2014 Initial Biopsy Lung, needle/core biopsy(ies), right upper lobe - POORLY DIFFERENTIATED ADENOCARCINOMA, SEE COMMENT.   08/08/2014 PET scan Large hypermetabolic R apical mass with evidence of direct chest wall and mediastinal invasion, right retrocrural lymphadenopathy, extensive retroperitoneal lymphadenopathy, and metastatic lesions to the adrenal glands    09/02/2014 - 11/04/2014 Chemotherapy Cisplatin/Pemetrexed/Avastin every 21 days x 4 cycles   10/07/2014 - 10/27/2014 Radiation Therapy Right lung apex for control of brachioplexopathy.   12/24/2014 - 02/25/2015 Chemotherapy Alimta/Avastin every 21 days.   02/20/2015 Imaging Increase in size of right adrenal metastasis and subjacent confluent retrocaval lymphadenopathy   02/25/2015 -  Chemotherapy Nivolumab, zometa   I personally reviewed and went over laboratory results with the patient.  The results are noted within this dictation.  She reports that she is doing well.  Her cough is decreased.  Her appetite is stable.  Her weight is stable compared to Feb 2016.  She is educated that we wait about 9 weeks from start of therapy to restage with imaging.  Oncologically, she denies any complaints and ROS questioning is negative.   Past Medical History   Diagnosis Date  . Hypertension   . Reflux   . Hyperlipidemia   . Lung mass 07/28/2014  . Adrenal mass, right 07/28/2014  . Diabetes mellitus without complication   . Cancer     lung  right    has Adrenal mass, right; Cancer of upper lobe of right lung; ARF (acute renal failure); Abnormal finding on imaging; Oropharyngeal dysphagia; Nausea and vomiting; Hypercalcemia; Anemia in neoplastic disease; Dysphagia; and Encounter for screening colonoscopy on her problem list.     has No Known Allergies.  Current Outpatient Prescriptions on File Prior to Visit  Medication Sig Dispense Refill  . Alum & Mag Hydroxide-Simeth (MAGIC MOUTHWASH W/LIDOCAINE) SOLN Take 5 mLs by mouth 4 (four) times daily as needed for mouth pain. 400 mL 4  . Fluticasone-Salmeterol (ADVAIR DISKUS) 500-50 MCG/DOSE AEPB Two puffs at the same time daily. 60 each 6  . folic acid (FOLVITE) 1 MG tablet Take 1 mg by mouth every morning.    . hydrochlorothiazide (MICROZIDE) 12.5 MG capsule Take 12.5 mg by mouth every morning.     . Iron-FA-B Cmp-C-Biot-Probiotic (FUSION PLUS) CAPS Take 1 capsule by mouth daily.     Marland Kitchen lidocaine-prilocaine (EMLA) cream Apply a quarter size amount to port site 1 hour prior to chemo. Do not rub in. Cover with plastic wrap. 30 g 3  . megestrol (MEGACE) 40 MG/ML suspension Take 400 mg by mouth daily.     . metFORMIN (GLUCOPHAGE) 500 MG tablet Take 1 tablet (500 mg total) by mouth 2 (two) times daily with a meal. 60 tablet 2  . metoCLOPramide (REGLAN) 10 MG tablet Take 1 tablet (  10 mg total) by mouth 4 (four) times daily. 120 tablet 3  . Nivolumab (OPDIVO IV) Inject into the vein every 14 (fourteen) days.    Marland Kitchen omeprazole (PRILOSEC) 20 MG capsule Take 1 capsule (20 mg total) by mouth 2 (two) times daily before a meal. 60 capsule 11  . ondansetron (ZOFRAN) 4 MG tablet Take 1 tablet (4 mg total) by mouth every 8 (eight) hours. 60 tablet 1  . Oxycodone HCl 10 MG TABS Take 1-2 tablets (10-20 mg total) by mouth  every 6 (six) hours as needed (pain.). 100 tablet 0  . senna (SENOKOT) 8.6 MG tablet Take 2 tablets by mouth at bedtime.     . simvastatin (ZOCOR) 40 MG tablet Take 40 mg by mouth every morning.     . fentaNYL (DURAGESIC - DOSED MCG/HR) 12 MCG/HR Place 1 patch (12.5 mcg total) onto the skin every 3 (three) days. (Patient not taking: Reported on 12/24/2014) 10 patch 0  . fentaNYL (DURAGESIC - DOSED MCG/HR) 25 MCG/HR patch Place 1 patch (25 mcg total) onto the skin every 3 (three) days. (Patient not taking: Reported on 12/24/2014) 10 patch 0  . HYDROcodone-homatropine (HYCODAN) 5-1.5 MG/5ML syrup Take 5 mLs by mouth every 6 (six) hours as needed for cough. (Patient not taking: Reported on 04/08/2015) 200 mL 0  . ibuprofen (ADVIL,MOTRIN) 200 MG tablet Take 200-800 mg by mouth once as needed for headache or mild pain.    . polyethylene glycol-electrolytes (NULYTELY/GOLYTELY) 420 G solution Take 4,000 mLs by mouth once. (Patient not taking: Reported on 04/08/2015) 4000 mL 0  . predniSONE (DELTASONE) 10 MG tablet Prednisone 66m (8 tabs) by mouth daily x 3 days, then 759m(7 tabs) x 3 days, 6041m6 tabs) x 3 days, 41m64m tabs) x 3 days, 40mg44mtabs) x 3 days, 30mg 80mabs) x 3 days, 20mg (29mbs) x 3 days, 10mg (173m) x 3 days, 5mg (1/266mb) x 3 days. Start @ 1st sign of diarrhea. (Patient not taking: Reported on 03/11/2015) 120 tablet 0  . prochlorperazine (COMPAZINE) 10 MG tablet Starting the day after chemo, take 1 tab four times a day x 2 days. Then may take 1 tab four times a day IF needed for nausea/vomiting. (Patient not taking: Reported on 04/08/2015) 60 tablet 2  . triamcinolone (KENALOG) 0.025 % cream Apply 1 application topically 2 (two) times daily.     Current Facility-Administered Medications on File Prior to Visit  Medication Dose Route Frequency Provider Last Rate Last Dose  . 0.9 %  sodium chloride infusion   Intravenous Once Shannon KPatrici Ranks . heparin lock flush 100 unit/mL  500 Units  Intracatheter Once PRN Shannon KPatrici Ranks . nivolumab (OPDIVO) 110 mg in sodium chloride 0.9 % 100 mL chemo infusion  3 mg/kg (Treatment Plan Actual) Intravenous Once Shannon KPatrici Ranks . sodium chloride 0.9 % injection 10 mL  10 mL Intracatheter PRN Shannon KPatrici Ranks   Past Surgical History  Procedure Laterality Date  . Appendectomy    . Lung biopsy Right 07/2014    CT guided  . Portacath placement Left 09/01/2014  . Esophagogastroduodenoscopy N/A 12/09/2014    SLF:distaBZJ:IRCVELal stricture/mild-to-noderate erosive gastritis. negative H.pylori  . Savory dilation N/A 12/09/2014    Procedure: SAVORY DILATION;  Surgeon: Sandi L FDanie Bindercation: [facility];  Service:  Endoscopy;  Laterality: N/A;  [name] dilation N/A 12/09/2014    Procedure: [name] DILATION;  Surgeon: [name], MD;  Location: [facility];  Service: Endoscopy;  Laterality: N/A;  . Flexible sigmoidoscopy  2011    Dr. [name]: hyperplastic polyp    Denies any headaches, dizziness, double vision, fevers, chills, night sweats, nausea, vomiting, diarrhea, constipation, chest pain, heart palpitations, shortness of breath, blood in stool, black tarry stool, urinary pain, urinary burning, urinary frequency, hematuria.   PHYSICAL EXAMINATION  ECOG PERFORMANCE STATUS: 1 - Symptomatic but completely ambulatory  Filed Vitals:   04/08/15 0927  BP: 121/83  Pulse: 89  Temp: 97.7 F (36.5 C)  Resp: 18    GENERAL:alert, no distress, well developed, cachectic, comfortable, cooperative, smiling and accompanied by daughter. SKIN: skin color, texture, turgor are normal, no rashes or significant lesions HEAD: Normocephalic, No masses, lesions, tenderness or abnormalities EYES: normal, PERRLA, EOMI, Conjunctiva are pink and non-injected EARS: External ears normal OROPHARYNX:lips, buccal mucosa, and tongue normal and mucous membranes are moist  NECK: supple, no adenopathy, thyroid normal size,  non-tender, without nodularity, trachea midline LYMPH:  no palpable lymphadenopathy, no hepatosplenomegaly BREAST:not examined LUNGS: clear to auscultation and percussion HEART: regular rate & rhythm ABDOMEN:abdomen soft, non-tender and normal bowel sounds BACK: Back symmetric, no curvature. EXTREMITIES:less then 2 second capillary refill, no joint deformities, effusion, or inflammation, no skin discoloration  NEURO: alert & oriented x 3 with fluent speech, no focal motor/sensory deficits, gait normal   LABORATORY DATA: CBC    Component Value Date/Time   WBC 8.4 04/08/2015 0925   RBC 3.55* 04/08/2015 0925   RBC 2.45* 01/15/2015 0901   HGB 10.8* 04/08/2015 0925   HCT 34.0* 04/08/2015 0925   PLT 280 04/08/2015 0925   MCV 95.8 04/08/2015 0925   MCH 30.4 04/08/2015 0925   MCHC 31.8 04/08/2015 0925   RDW 19.6* 04/08/2015 0925   LYMPHSABS 2.4 04/08/2015 0925   MONOABS 0.9 04/08/2015 0925   EOSABS 0.6 04/08/2015 0925   BASOSABS 0.0 04/08/2015 0925      Chemistry      Component Value Date/Time   NA 133* 04/08/2015 0925   K 5.1 04/08/2015 0925   CL 103 04/08/2015 0925   CO2 24 04/08/2015 0925   BUN 19 04/08/2015 0925   CREATININE 1.46* 04/08/2015 0925      Component Value Date/Time   CALCIUM 10.3 04/08/2015 0925   ALKPHOS 79 04/08/2015 0925   AST 23 04/08/2015 0925   ALT 13* 04/08/2015 0925   BILITOT 0.3 04/08/2015 0925        ASSESSMENT AND PLAN:  Cancer of upper lobe of right lung Stage IV adenocarcinoma of the lung, currently on salvage Nivolumab.  Due for treatment today if treatment parameters are met today.  Pre-chemo labs as ordered.  Return in 2 weeks for treatment, Zometa, and follow-up appointment.  We will restage at the beginning of June with CT CAP with contrast to evaluate response to therapy.   K+ is ULN, will decrease Kdur to 1 tablet daily.    THERAPY PLAN:  Continue treatment as planned while being vigilant of progressive disease.  All  questions were answered. The patient knows to call the clinic with any problems, questions or concerns. We can certainly see the patient much sooner if necessary.  Patient and plan discussed with Dr. Ancil Linsey and she is in agreement with the aforementioned.   This note is electronically signed by: Robynn Pane 04/08/2015 10:30 AM

## 2015-04-08 NOTE — Progress Notes (Signed)
[facility] Clinical Social Work  Clinical Social Work was referred by [facility] rounding to check in and reassess needs. Pt has received all 3 cases of Ensure through dietician and has ongoing nutrition concerns. CSW reached out to dietician today and he will check in on her. CSW researching other possible options in [place] area, pt's county of residence. Pt and daughter report they received gas cards through the [employer] several weeks ago and this continues to be a huge help.  Overall, pt's spirits are good and family is still a huge support. CSW will keep following pt and reassess needs as appropriate.   Clinical Social Work interventions: Set designer education  [name], [place] Tuesdays 8:30-1pm Wednesdays 8:30-12pm  [phone]

## 2015-04-08 NOTE — Patient Instructions (Addendum)
[facility] Discharge Instructions for Patients Receiving Chemotherapy  Today you received the following chemotherapy agents Opdivo. Hemoglobin 10.8 today. You DO NOT need Aranesp injection today. DECREASE potassium to 1 pill daily.  To help prevent nausea and vomiting after your treatment, we encourage you to take your nausea medication as instructed. If you develop nausea and vomiting that is not controlled by your nausea medication, call the clinic. If it is after clinic hours your family physician or the after hours number for the clinic or go to the Emergency Department. BELOW ARE SYMPTOMS THAT SHOULD BE REPORTED IMMEDIATELY:  *FEVER GREATER THAN 101.0 F  *CHILLS WITH OR WITHOUT FEVER  NAUSEA AND VOMITING THAT IS NOT CONTROLLED WITH YOUR NAUSEA MEDICATION  *UNUSUAL SHORTNESS OF BREATH  *UNUSUAL BRUISING OR BLEEDING  TENDERNESS IN MOUTH AND THROAT WITH OR WITHOUT PRESENCE OF ULCERS  *URINARY PROBLEMS  *BOWEL PROBLEMS  UNUSUAL RASH Items with * indicate a potential emergency and should be followed up as soon as possible.  Return as scheduled.  I have been informed and understand all the instructions given to me. I know to contact the clinic, my physician, or go to the Emergency Department if any problems should occur. I do not have any questions at this time, but understand that I may call the clinic during office hours or the Patient Navigator at [phone] should I have any questions or need assistance in obtaining follow up care.    __________________________________________  _____________  __________ Signature of Patient or Authorized Representative            Date                   Time    __________________________________________ Nurse's Signature

## 2015-04-08 NOTE — Progress Notes (Signed)
Tolerated Opdivo infusion well.

## 2015-04-08 NOTE — Patient Instructions (Signed)
..  [place] at [facility] Discharge Instructions  RECOMMENDATIONS MADE BY THE CONSULTANT AND ANY TEST RESULTS WILL BE SENT TO YOUR REFERRING PHYSICIAN.  Return in 2 weeks In 2 weeks we will do nivolumab and zometa in 2 weeks  CT scans beginning of June   Thank you for choosing [place] at [facility] to provide your oncology and hematology care.  To afford each patient quality time with our provider, please arrive at least 15 minutes before your scheduled appointment time.    You need to re-schedule your appointment should you arrive 10 or more minutes late.  We strive to give you quality time with our providers, and arriving late affects you and other patients whose appointments are after yours.  Also, if you no show three or more times for appointments you may be dismissed from the clinic at the providers discretion.     Again, thank you for choosing [facility]  Our hope is that these requests will decrease the amount of time that you wait before being seen by our physicians.       _____________________________________________________________  Should you have questions after your visit to [facility], please contact our office at [phone] between the hours of 8:30 a.m. and 4:30 p.m.  Voicemails left after 4:30 p.m. will not be returned until the following business day.  For prescription refill requests, have your pharmacy contact our office.

## 2015-04-08 NOTE — Addendum Note (Signed)
Addended by: Baird Cancer on: 04/08/2015 10:31 AM   Modules accepted: Orders, Medications

## 2015-04-08 NOTE — Assessment & Plan Note (Addendum)
Stage IV adenocarcinoma of the lung, currently on salvage Nivolumab.  Due for treatment today if treatment parameters are met today.  Pre-chemo labs as ordered.  Return in 2 weeks for treatment, Zometa, and follow-up appointment.  We will restage at the beginning of June with CT CAP with contrast to evaluate response to therapy.   K+ is ULN, will decrease Kdur to 1 tablet daily.

## 2015-04-11 ENCOUNTER — Encounter (HOSPITAL_COMMUNITY): Payer: Self-pay | Admitting: Hematology & Oncology

## 2015-04-11 NOTE — Progress Notes (Signed)
[name], stage IV, bilateral adrenal metastases, retroperitoneal lymph nodes  Biopsy on 08/01/2014 c/w poorly differentiated adenocarcinoma  XRT completed on 10/27/2014  Right Brachial plexus neuralgia  EGFR mutation negative ALK mutation negative  Extensive soft tissue thickening in the posterior nasopharynx with bilateral level IIa lymphadenopathy in the neck. The possibility of a second primary neoplasm in this region warrants consideration. Clinical correlation is recommended.  CURRENT THERAPY: Nivolumab  INTERVAL HISTORY: Melissa Sullivan 59 y.o. female returns for follow-up of stage IV adenocarcinoma of the lung. She has started therapy with nivolumab with excellent tolerance.  She notes occasional early satiety and bloating. She denies new cough, SOB, or diarrhea. No skin rash.  MEDICAL HISTORY: Past Medical History  Diagnosis Date  . Hypertension   . Reflux   . Hyperlipidemia   . Lung mass 07/28/2014  . Adrenal mass, right 07/28/2014  . Diabetes mellitus without complication   . Cancer     lung  right    has Adrenal mass, right; Cancer of upper lobe of right lung; ARF (acute renal failure); Abnormal finding on imaging; Oropharyngeal dysphagia; Nausea and vomiting; Hypercalcemia; Anemia in neoplastic disease; Dysphagia; and Encounter for screening colonoscopy on her problem list.      Cancer of upper lobe of right lung   07/28/2014 Imaging CT chest: Large R apical mass consistent with malignancy. This is destroying the R 2nd rib with extension into adjacent soft tissue. R hilar adenopathy with R 5cm adrenal metastatic lesion.   08/01/2014 Initial Biopsy Lung, needle/core biopsy(ies), right upper lobe - POORLY DIFFERENTIATED ADENOCARCINOMA, SEE COMMENT.   08/08/2014 PET scan Large hypermetabolic R apical mass with evidence of direct chest wall and mediastinal invasion, right retrocrural lymphadenopathy, extensive retroperitoneal lymphadenopathy, and metastatic  lesions to the adrenal glands    09/02/2014 - 11/04/2014 Chemotherapy Cisplatin/Pemetrexed/Avastin every 21 days x 4 cycles   10/07/2014 - 10/27/2014 Radiation Therapy Right lung apex for control of brachioplexopathy.   12/24/2014 - 02/25/2015 Chemotherapy Alimta/Avastin every 21 days.   02/20/2015 Imaging Increase in size of right adrenal metastasis and subjacent confluent retrocaval lymphadenopathy   02/25/2015 -  Chemotherapy Nivolumab, zometa     has No Known Allergies.  Melissa Sullivan does not currently have medications on file.  SURGICAL HISTORY: Past Surgical History  Procedure Laterality Date  . Appendectomy    . Lung biopsy Right 07/2014    CT guided  . Portacath placement Left 09/01/2014  . Esophagogastroduodenoscopy N/A 12/09/2014    HKF:EXMDYJ esophageal stricture/mild-to-noderate erosive gastritis. negative H.pylori  . Savory dilation N/A 12/09/2014    Procedure: SAVORY DILATION;  Surgeon: [name], MD;  [facility];  Service: Endoscopy;  Laterality: N/A;  [name] dilation N/A 12/09/2014    Procedure: [name] DILATION;  Surgeon: [name], MD;  Location[facility];  Service: Endoscopy;  Laterality: N/A;  . Flexible sigmoidoscopy  2011    Dr. [name]: hyperplastic polyp    SOCIAL HISTORY: History   Social History  . Marital Status: Married    Spouse Name: N/A  . Number of Children: N/A  . Years of Education: N/A   Occupational History  . Not on file.   Social History Main Topics  . Smoking status: Light Tobacco Smoker -- 0.30 packs/day    Types: Cigarettes  . Smokeless tobacco: Never Used  . Alcohol Use: No  . Drug Use: No  . Sexual Activity: Not on file   Other Topics Concern  .  Not on file   Social History Narrative    FAMILY HISTORY: Family History  Problem Relation Age of Onset  . Cancer Sister     Review of Systems  Constitutional: Negative for fever, chills, weight loss and malaise/fatigue.  HENT: Negative for congestion,  hearing loss, nosebleeds, sore throat and tinnitus.   Eyes: Negative for blurred vision, double vision, pain and discharge.  Respiratory: Negative for cough, hemoptysis, sputum production, shortness of breath and wheezing.   Cardiovascular: Negative for chest pain, palpitations, claudication, leg swelling and PND.  Gastrointestinal: Negative for heartburn, nausea, vomiting, abdominal pain, diarrhea, constipation, blood in stool and melena.       Bloating, early satiety  Genitourinary: Negative for dysuria, urgency, frequency and hematuria.  Musculoskeletal: Negative for myalgias, joint pain and falls.  Skin: Negative for itching and rash.  Neurological: Negative for dizziness, tingling, tremors, sensory change, speech change, focal weakness, seizures, loss of consciousness, weakness and headaches.  Endo/Heme/Allergies: Does not bruise/bleed easily.  Psychiatric/Behavioral: Negative for depression, suicidal ideas, memory loss and substance abuse. The patient is not nervous/anxious and does not have insomnia.     PHYSICAL EXAMINATION  ECOG PERFORMANCE STATUS: 1 - Symptomatic but completely ambulatory  Filed Vitals:   03/11/15 1000  BP: 108/74  Pulse: 95  Temp: 99 F (37.2 C)  Resp: 18    Physical Exam  Constitutional: She is oriented to person, place, and time.  HENT:  Head: Normocephalic and atraumatic.  Nose: Nose normal.  Mouth/Throat: Oropharynx is clear and moist. No oropharyngeal exudate.  Eyes: Conjunctivae and EOM are normal. Pupils are equal, round, and reactive to light. Right eye exhibits no discharge. Left eye exhibits no discharge. No scleral icterus.  Neck: Normal range of motion. Neck supple. No tracheal deviation present. No thyromegaly present.  Cardiovascular: Normal rate, regular rhythm and normal heart sounds.  Exam reveals no gallop and no friction rub.   No murmur heard. Pulmonary/Chest: Effort normal and breath sounds normal. She has no wheezes. She has no  rales.  Abdominal: Soft. Bowel sounds are normal. She exhibits no distension and no mass. There is no tenderness. There is no rebound and no guarding.  Thin  Musculoskeletal: Normal range of motion. She exhibits no edema.  Lymphadenopathy:    She has no cervical adenopathy.  Neurological: She is alert and oriented to person, place, and time. She has normal reflexes. No cranial nerve deficit. Gait normal. Coordination normal.  Skin: Skin is warm and dry. No rash noted.  Psychiatric: Mood, memory, affect and judgment normal.  Nursing note and vitals reviewed.   LABORATORY DATA: Results for Melissa, Sullivan (MRN [id number])   Ref. Range 03/11/2015 09:50 03/11/2015 09:52  Sodium Latest Ref Range: 135-145 mmol/L 134 (L)   Potassium Latest Ref Range: 3.5-5.1 mmol/L 3.8   Chloride Latest Ref Range: 96-112 mmol/L 99   CO2 Latest Ref Range: 19-32 mmol/L 24   BUN Latest Ref Range: 6-23 mg/dL 14   Creatinine Latest Ref Range: 0.50-1.10 mg/dL 1.47 (H)   Calcium Latest Ref Range: 8.4-10.5 mg/dL 10.4   EGFR (Non-African Amer.) Latest Ref Range: >90 mL/min 38 (L)   EGFR (African American) Latest Ref Range: >90 mL/min 44 (L)   Glucose Latest Ref Range: 70-99 mg/dL 114 (H)   Anion gap Latest Ref Range: 5-15  11   Alkaline Phosphatase Latest Ref Range: 39-117 U/L 72   Albumin Latest Ref Range: 3.5-5.2 g/dL 3.3 (L)   AST Latest Ref Range: 0-37 U/L 27  ALT Latest Ref Range: 0-35 U/L 9   Total Protein Latest Ref Range: 6.0-8.3 g/dL 8.5 (H)   Total Bilirubin Latest Ref Range: 0.3-1.2 mg/dL 0.3   WBC Latest Ref Range: 4.0-10.5 K/uL 7.6   RBC Latest Ref Range: 3.87-5.11 MIL/uL 3.59 (L)   Hemoglobin Latest Ref Range: 12.0-15.0 g/dL 10.8 (L)   HCT Latest Ref Range: 36.0-46.0 % 34.0 (L)   MCV Latest Ref Range: 78.0-100.0 fL 94.7   MCH Latest Ref Range: 26.0-34.0 pg 30.1   MCHC Latest Ref Range: 30.0-36.0 g/dL 31.8   RDW Latest Ref Range: 11.5-15.5 % 19.7 (H)   Platelets Latest Ref Range: 150-400 K/uL 236    Neutrophils Latest Ref Range: 43-77 %  62  Lymphocytes Latest Ref Range: 12-46 %  21  Monocytes Relative Latest Ref Range: 3-12 %  13 (H)  Eosinophil Latest Ref Range: 0-5 %  3  Basophil Latest Ref Range: 0-1 %  0  NEUT# Latest Ref Range: 1.7-7.7 K/uL  4.7  Lymphocyte # Latest Ref Range: 0.7-4.0 K/uL  1.6  Monocyte # Latest Ref Range: 0.1-1.0 K/uL  1.0  Eosinophils Absolute Latest Ref Range: 0.0-0.7 K/uL  0.3  Basophils Absolute Latest Ref Range: 0.0-0.1 K/uL  0.0   CLINICAL DATA: Right upper lobe stage IV non-small cell lung cancer. Ongoing chemotherapy. Adrenal mass on prior exam.  EXAM: 02/20/2015 CT CHEST, ABDOMEN, AND PELVIS WITH CONTRAST  IMPRESSION: No significant interval change in right upper lobe mass compared to prior exam.  Progression of upper thoracic and right apical rib sclerosis which may indicate treatment effect on osseous metastatic disease. Benign radiation change is possible but felt less likely.  Areas of inspissated intrabronchial secretions with adjacent tree-in-bud type pulmonary parenchymal nodularity likely indicating small airways infection reidentified.  Increase in size of right adrenal metastasis and subjacent confluent retrocaval lymphadenopathy.   Electronically Signed  By: Conchita Paris M.D.  On: 02/20/2015 12:47   ASSESSMENT and THERAPY PLAN:   Stage IV adenocarcinoma of the lung, EGFR and ALK negative  She will continue on current therapy. She is overall doing quite well. She was again advised side effects of therapy and was able to describe them in detail. She knows to call should she develop any of them. She also has prednisone at home should she develop diarrhea.   Hypercalcemia/bony metastatic disease  She will continue with Zometa as prescribed.  Abdominal bloating  I have given her Reglan to try. We also discussed eating small frequent meals. She has had a recent EGD. If symptoms continue she is to let us  know.  All questions were answered. The patient knows to call the clinic with any problems, questions or concerns. We can certainly see the patient much sooner if necessary.  Tison Leibold KristenMD 04/11/2015

## 2015-04-15 ENCOUNTER — Other Ambulatory Visit: Payer: Self-pay | Admitting: Radiation Oncology

## 2015-04-22 ENCOUNTER — Encounter (HOSPITAL_COMMUNITY): Payer: Medicaid Other | Attending: Hematology and Oncology

## 2015-04-22 ENCOUNTER — Encounter (HOSPITAL_BASED_OUTPATIENT_CLINIC_OR_DEPARTMENT_OTHER): Payer: Medicaid Other | Admitting: Oncology

## 2015-04-22 ENCOUNTER — Encounter: Payer: Self-pay | Admitting: *Deleted

## 2015-04-22 VITALS — BP 111/70 | HR 79 | Temp 98.2°F | Resp 16 | Wt 83.8 lb

## 2015-04-22 DIAGNOSIS — E785 Hyperlipidemia, unspecified: Secondary | ICD-10-CM | POA: Diagnosis not present

## 2015-04-22 DIAGNOSIS — D630 Anemia in neoplastic disease: Secondary | ICD-10-CM | POA: Diagnosis not present

## 2015-04-22 DIAGNOSIS — F17210 Nicotine dependence, cigarettes, uncomplicated: Secondary | ICD-10-CM | POA: Diagnosis not present

## 2015-04-22 DIAGNOSIS — C7971 Secondary malignant neoplasm of right adrenal gland: Secondary | ICD-10-CM

## 2015-04-22 DIAGNOSIS — C7972 Secondary malignant neoplasm of left adrenal gland: Secondary | ICD-10-CM | POA: Diagnosis not present

## 2015-04-22 DIAGNOSIS — I10 Essential (primary) hypertension: Secondary | ICD-10-CM | POA: Diagnosis not present

## 2015-04-22 DIAGNOSIS — C7989 Secondary malignant neoplasm of other specified sites: Secondary | ICD-10-CM | POA: Insufficient documentation

## 2015-04-22 DIAGNOSIS — C3411 Malignant neoplasm of upper lobe, right bronchus or lung: Secondary | ICD-10-CM | POA: Diagnosis not present

## 2015-04-22 DIAGNOSIS — C7951 Secondary malignant neoplasm of bone: Secondary | ICD-10-CM

## 2015-04-22 DIAGNOSIS — Z9089 Acquired absence of other organs: Secondary | ICD-10-CM | POA: Diagnosis not present

## 2015-04-22 DIAGNOSIS — K219 Gastro-esophageal reflux disease without esophagitis: Secondary | ICD-10-CM | POA: Diagnosis not present

## 2015-04-22 DIAGNOSIS — C772 Secondary and unspecified malignant neoplasm of intra-abdominal lymph nodes: Secondary | ICD-10-CM | POA: Insufficient documentation

## 2015-04-22 DIAGNOSIS — C781 Secondary malignant neoplasm of mediastinum: Secondary | ICD-10-CM | POA: Insufficient documentation

## 2015-04-22 DIAGNOSIS — Z79899 Other long term (current) drug therapy: Secondary | ICD-10-CM | POA: Insufficient documentation

## 2015-04-22 DIAGNOSIS — J449 Chronic obstructive pulmonary disease, unspecified: Secondary | ICD-10-CM | POA: Diagnosis not present

## 2015-04-22 DIAGNOSIS — E278 Other specified disorders of adrenal gland: Secondary | ICD-10-CM

## 2015-04-22 DIAGNOSIS — Z5112 Encounter for antineoplastic immunotherapy: Secondary | ICD-10-CM | POA: Diagnosis not present

## 2015-04-22 LAB — COMPREHENSIVE METABOLIC PANEL
ALT: 12 U/L — ABNORMAL LOW (ref 14–54)
AST: 22 U/L (ref 15–41)
Albumin: 3.5 g/dL (ref 3.5–5.0)
Alkaline Phosphatase: 58 U/L (ref 38–126)
Anion gap: 8 (ref 5–15)
BUN: 15 mg/dL (ref 6–20)
CO2: 24 mmol/L (ref 22–32)
Calcium: 9.7 mg/dL (ref 8.9–10.3)
Chloride: 100 mmol/L — ABNORMAL LOW (ref 101–111)
Creatinine, Ser: 1.28 mg/dL — ABNORMAL HIGH (ref 0.44–1.00)
GFR calc non Af Amer: 45 mL/min — ABNORMAL LOW (ref >60)
GFR, EST AFRICAN AMERICAN: 52 mL/min — AB (ref >60)
GLUCOSE: 104 mg/dL — AB (ref 65–99)
Potassium: 4.7 mmol/L (ref 3.5–5.1)
Sodium: 132 mmol/L — ABNORMAL LOW (ref 135–145)
Total Bilirubin: 0.4 mg/dL (ref 0.3–1.2)
Total Protein: 7.8 g/dL (ref 6.5–8.1)

## 2015-04-22 LAB — CBC WITH DIFFERENTIAL/PLATELET
BASOS PCT: 0 % (ref 0–1)
Basophils Absolute: 0 10*3/uL (ref 0.0–0.1)
EOS PCT: 6 % — AB (ref 0–5)
Eosinophils Absolute: 0.4 10*3/uL (ref 0.0–0.7)
HCT: 29.2 % — ABNORMAL LOW (ref 36.0–46.0)
HEMOGLOBIN: 9.3 g/dL — AB (ref 12.0–15.0)
Lymphocytes Relative: 27 % (ref 12–46)
Lymphs Abs: 1.7 10*3/uL (ref 0.7–4.0)
MCH: 31 pg (ref 26.0–34.0)
MCHC: 31.8 g/dL (ref 30.0–36.0)
MCV: 97.3 fL (ref 78.0–100.0)
MONO ABS: 0.6 10*3/uL (ref 0.1–1.0)
Monocytes Relative: 9 % (ref 3–12)
NEUTROS PCT: 58 % (ref 43–77)
Neutro Abs: 3.6 10*3/uL (ref 1.7–7.7)
Platelets: 223 10*3/uL (ref 150–400)
RBC: 3 MIL/uL — AB (ref 3.87–5.11)
RDW: 18.6 % — ABNORMAL HIGH (ref 11.5–15.5)
WBC: 6.3 10*3/uL (ref 4.0–10.5)

## 2015-04-22 MED ORDER — SODIUM CHLORIDE 0.9 % IV SOLN: Freq: Once | INTRAVENOUS | Status: DC

## 2015-04-22 MED ORDER — SODIUM CHLORIDE 0.9 % IV SOLN
3.0000 mg/kg | Freq: Once | INTRAVENOUS | Status: AC
  Administered 2015-04-22: 110 mg via INTRAVENOUS
  Filled 2015-04-22: qty 10

## 2015-04-22 MED ORDER — SODIUM CHLORIDE 0.9 % IJ SOLN
10.0000 mL | INTRAMUSCULAR | Status: DC | PRN
  Administered 2015-04-22: 10 mL
  Filled 2015-04-22: qty 10

## 2015-04-22 MED ORDER — ZOLEDRONIC ACID 4 MG/5ML IV CONC
3.5000 mg | Freq: Once | INTRAVENOUS | Status: AC
  Administered 2015-04-22: 3.5 mg via INTRAVENOUS
  Filled 2015-04-22: qty 4.38

## 2015-04-22 MED ORDER — DARBEPOETIN ALFA 500 MCG/ML IJ SOSY
PREFILLED_SYRINGE | INTRAMUSCULAR | Status: AC
  Filled 2015-04-22: qty 1

## 2015-04-22 MED ORDER — SODIUM CHLORIDE 0.9 % IV SOLN
Freq: Once | INTRAVENOUS | Status: AC
  Administered 2015-04-22: 12:00:00 via INTRAVENOUS

## 2015-04-22 MED ORDER — HEPARIN SOD (PORK) LOCK FLUSH 100 UNIT/ML IV SOLN
500.0000 [IU] | Freq: Once | INTRAVENOUS | Status: AC | PRN
  Administered 2015-04-22: 500 [IU]
  Filled 2015-04-22: qty 5

## 2015-04-22 MED ORDER — DARBEPOETIN ALFA 500 MCG/ML IJ SOSY
500.0000 ug | PREFILLED_SYRINGE | Freq: Once | INTRAMUSCULAR | Status: AC
  Administered 2015-04-22: 500 ug via SUBCUTANEOUS

## 2015-04-22 NOTE — Progress Notes (Signed)
[place] Clinical Social Work  Clinical Social Work was referred by [place] rounding for assessment of psychosocial needs due to previous financial concerns.  Clinical Social Worker met patient at [facility] to offer support and assess for needs.  Pt in great spirits today, super excited she gained 5 pounds! Pt reports she went fishing the other day because she had so much more energy. Pt shared she was able to get forms completed for [employer] assistance program and had no other needs right now. CSW updated dietician as well.     Clinical Social Work interventions: Reassess needs   [name], [place] Tuesdays 8:30-1pm Wednesdays 8:30-12pm  [phone]

## 2015-04-22 NOTE — Progress Notes (Signed)
Leonard Downing presents today for injection per MD orders. Aranesp 500 mcg administered SQ in right Abdomen. Administration without incident. Patient tolerated well. Tolerated chemo well.

## 2015-04-22 NOTE — Patient Instructions (Signed)
[place] at [facility] Discharge Instructions  RECOMMENDATIONS MADE BY THE CONSULTANT AND ANY TEST RESULTS WILL BE SENT TO YOUR REFERRING PHYSICIAN.  Exam completed by Kirby Crigler today Chemotherapy as scheduled today. CT scan on 6/13. Return in 2 weeks as scheduled Referral sent to podiatry for ingrown toenail, they will call you. Please call the clinic if you have any questions or concerns   Thank you for choosing [place] at [facility] to provide your oncology and hematology care.  To afford each patient quality time with our provider, please arrive at least 15 minutes before your scheduled appointment time.    You need to re-schedule your appointment should you arrive 10 or more minutes late.  We strive to give you quality time with our providers, and arriving late affects you and other patients whose appointments are after yours.  Also, if you no show three or more times for appointments you may be dismissed from the clinic at the providers discretion.     Again, thank you for choosing [facility].  Our hope is that these requests will decrease the amount of time that you wait before being seen by our physicians.       _____________________________________________________________  Should you have questions after your visit to [facility], please contact our office at [phone] between the hours of 8:30 a.m. and 4:30 p.m.  Voicemails left after 4:30 p.m. will not be returned until the following business day.  For prescription refill requests, have your pharmacy contact our office.

## 2015-04-22 NOTE — Progress Notes (Signed)
Melissa Cahill, MD  [place] [address]  Cancer of upper lobe of right lung  CURRENT THERAPY: Nivolumab/zometa  INTERVAL HISTORY: Melissa Sullivan 59 y.o. female returns for followup of stage IV adenocarcinoma of the lung.    Cancer of upper lobe of right lung   07/28/2014 Imaging CT chest: Large R apical mass consistent with malignancy. This is destroying the R 2nd rib with extension into adjacent soft tissue. R hilar adenopathy with R 5cm adrenal metastatic lesion.   08/01/2014 Initial Biopsy Lung, needle/core biopsy(ies), right upper lobe - POORLY DIFFERENTIATED ADENOCARCINOMA, SEE COMMENT.   08/08/2014 PET scan Large hypermetabolic R apical mass with evidence of direct chest wall and mediastinal invasion, right retrocrural lymphadenopathy, extensive retroperitoneal lymphadenopathy, and metastatic lesions to the adrenal glands    09/02/2014 - 11/04/2014 Chemotherapy Cisplatin/Pemetrexed/Avastin every 21 days x 4 cycles   10/07/2014 - 10/27/2014 Radiation Therapy Right lung apex for control of brachioplexopathy.   12/24/2014 - 02/25/2015 Chemotherapy Alimta/Avastin every 21 days.   02/20/2015 Imaging Increase in size of right adrenal metastasis and subjacent confluent retrocaval lymphadenopathy   02/25/2015 -  Chemotherapy Nivolumab, zometa   I personally reviewed and went over laboratory results with the patient.  The results are noted within this dictation.  She continues to tolerate therapy well.  Her appetite is improved and she has gained some weight as well.  She requests a refill on HCTZ, but I have called her pharmacy and they do not have this medication on file.  I will defer to her primary care provider for refills.  She requests a referral to podiatry for an ingrown toenail.  Oncologically, she denies any complaints and ROS questioning is negative.   Past Medical History  Diagnosis Date  . Hypertension   . Reflux   . Hyperlipidemia   . Lung mass  07/28/2014  . Adrenal mass, right 07/28/2014  . Diabetes mellitus without complication   . Cancer     lung  right    has Adrenal mass, right; Cancer of upper lobe of right lung; ARF (acute renal failure); Abnormal finding on imaging; Oropharyngeal dysphagia; Nausea and vomiting; Hypercalcemia; Anemia in neoplastic disease; Dysphagia; and Encounter for screening colonoscopy on her problem list.     has No Known Allergies.  Current Outpatient Prescriptions on File Prior to Visit  Medication Sig Dispense Refill  . Alum & Mag Hydroxide-Simeth (MAGIC MOUTHWASH W/LIDOCAINE) SOLN Take 5 mLs by mouth 4 (four) times daily as needed for mouth pain. 400 mL 4  . fentaNYL (DURAGESIC - DOSED MCG/HR) 12 MCG/HR Place 1 patch (12.5 mcg total) onto the skin every 3 (three) days. (Patient not taking: Reported on 04/22/2015) 10 patch 0  . fentaNYL (DURAGESIC - DOSED MCG/HR) 25 MCG/HR patch Place 1 patch (25 mcg total) onto the skin every 3 (three) days. (Patient not taking: Reported on 12/24/2014) 10 patch 0  . Fluticasone-Salmeterol (ADVAIR DISKUS) 500-50 MCG/DOSE AEPB Two puffs at the same time daily. 60 each 6  . folic acid (FOLVITE) 1 MG tablet Take 1 mg by mouth every morning.    . hydrochlorothiazide (MICROZIDE) 12.5 MG capsule Take 12.5 mg by mouth every morning.     Marland Kitchen HYDROcodone-homatropine (HYCODAN) 5-1.5 MG/5ML syrup Take 5 mLs by mouth every 6 (six) hours as needed for cough. (Patient not taking: Reported on 04/08/2015) 200 mL 0  . ibuprofen (ADVIL,MOTRIN) 200 MG tablet Take 200-800 mg by mouth once as needed  for headache or mild pain.    . Iron-FA-B Cmp-C-Biot-Probiotic (FUSION PLUS) CAPS Take 1 capsule by mouth daily.     Marland Kitchen lidocaine-prilocaine (EMLA) cream Apply a quarter size amount to port site 1 hour prior to chemo. Do not rub in. Cover with plastic wrap. 30 g 3  . megestrol (MEGACE) 40 MG/ML suspension Take 400 mg by mouth daily.     . metFORMIN (GLUCOPHAGE) 500 MG tablet Take 1 tablet (500 mg total)  by mouth 2 (two) times daily with a meal. 60 tablet 2  . metoCLOPramide (REGLAN) 10 MG tablet Take 1 tablet (10 mg total) by mouth 4 (four) times daily. (Patient not taking: Reported on 04/22/2015) 120 tablet 3  . Nivolumab (OPDIVO IV) Inject into the vein every 14 (fourteen) days.    Marland Kitchen omeprazole (PRILOSEC) 20 MG capsule Take 1 capsule (20 mg total) by mouth 2 (two) times daily before a meal. 60 capsule 11  . ondansetron (ZOFRAN) 4 MG tablet Take 1 tablet (4 mg total) by mouth every 8 (eight) hours. (Patient not taking: Reported on 04/22/2015) 60 tablet 1  . ondansetron (ZOFRAN) 8 MG tablet TAKE ONE TABLET BY MOUTH EVERY 12 HOURS AS NEEDED FOR NAUSEA (Patient not taking: Reported on 04/22/2015) 60 tablet 0  . Oxycodone HCl 10 MG TABS Take 1-2 tablets (10-20 mg total) by mouth every 6 (six) hours as needed (pain.). 100 tablet 0  . polyethylene glycol-electrolytes (NULYTELY/GOLYTELY) 420 G solution Take 4,000 mLs by mouth once. (Patient not taking: Reported on 04/08/2015) 4000 mL 0  . potassium chloride SA (K-DUR,KLOR-CON) 20 MEQ tablet Take 1 tablet (20 mEq total) by mouth daily. 30 tablet 0  . predniSONE (DELTASONE) 10 MG tablet Prednisone 9m (8 tabs) by mouth daily x 3 days, then 749m(7 tabs) x 3 days, 6031m6 tabs) x 3 days, 76m42m tabs) x 3 days, 40mg68mtabs) x 3 days, 30mg 9mabs) x 3 days, 20mg (41mbs) x 3 days, 10mg (171m) x 3 days, 5mg (1/222mb) x 3 days. Start @ 1st sign of diarrhea. (Patient not taking: Reported on 03/11/2015) 120 tablet 0  . prochlorperazine (COMPAZINE) 10 MG tablet Starting the day after chemo, take 1 tab four times a day x 2 days. Then may take 1 tab four times a day IF needed for nausea/vomiting. (Patient not taking: Reported on 04/08/2015) 60 tablet 2  . senna (SENOKOT) 8.6 MG tablet Take 2 tablets by mouth at bedtime.     . simvastatin (ZOCOR) 40 MG tablet Take 40 mg by mouth every morning.     . triamcinolone (KENALOG) 0.025 % cream Apply 1 application topically 2 (two)  times daily.     Current Facility-Administered Medications on File Prior to Visit  Medication Dose Route Frequency Provider Last Rate Last Dose  . 0.9 %  sodium chloride infusion   Intravenous Once Shannon KPatrici Ranks . 0.9 %  sodium chloride infusion   Intravenous Once Anastasio Wogan S Baird Cancer   . Darbepoetin Alfa (ARANESP) injection 500 mcg  500 mcg Subcutaneous Once Cartha Rotert S Avalynn Bowe, PA-C      . heparin lock flush 100 unit/mL  500 Units Intracatheter Once PRN Shannon KPatrici Ranks . nivolumab (OPDIVO) 110 mg in sodium chloride 0.9 % 100 mL chemo infusion  3 mg/kg (Treatment Plan Actual) Intravenous Once Shannon KPatrici Ranks . sodium  chloride 0.9 % injection 10 mL  10 mL Intracatheter PRN Patrici Ranks, MD      . zolendronic acid (ZOMETA) 3.5 mg in sodium chloride 0.9 % 100 mL IVPB  3.5 mg Intravenous Once Baird Cancer, PA-C        Past Surgical History  Procedure Laterality Date  . Appendectomy    . Lung biopsy Right 07/2014    CT guided  . Portacath placement Left 09/01/2014  . Esophagogastroduodenoscopy N/A 12/09/2014    HYI:FOYDXA esophageal stricture/mild-to-noderate erosive gastritis. negative H.pylori  . Savory dilation N/A 12/09/2014    Procedure: SAVORY DILATION;  Surgeon: [name], MD;  [facility];  Service: Endoscopy;  Laterality: N/A;  [name] dilation N/A 12/09/2014    Procedure: [name] DILATION;  Surgeon: [name], MD;  [facility];  Service: Endoscopy;  Laterality: N/A;  . Flexible sigmoidoscopy  2011    Dr. [name]: hyperplastic polyp    Denies any headaches, dizziness, double vision, fevers, chills, night sweats, nausea, vomiting, diarrhea, constipation, chest pain, heart palpitations, shortness of breath, blood in stool, black tarry stool, urinary pain, urinary burning, urinary frequency, hematuria.   PHYSICAL EXAMINATION  ECOG PERFORMANCE STATUS: 1 - Symptomatic but completely ambulatory  There were  no vitals filed for this visit.  GENERAL:alert, no distress, well developed, comfortable, cooperative, smiling and accompanied by daughter. SKIN: skin color, texture, turgor are normal, no rashes or significant lesions HEAD: Normocephalic, No masses, lesions, tenderness or abnormalities EYES: normal, PERRLA, EOMI, Conjunctiva are pink and non-injected EARS: External ears normal OROPHARYNX:lips, buccal mucosa, and tongue normal and mucous membranes are moist  NECK: supple, no adenopathy, thyroid normal size, non-tender, without nodularity, trachea midline LYMPH:  no palpable lymphadenopathy, no hepatosplenomegaly BREAST:not examined LUNGS: clear to auscultation and percussion HEART: regular rate & rhythm without murmur or gallop ABDOMEN:abdomen soft, non-tender and normal bowel sounds BACK: Back symmetric, no curvature. EXTREMITIES:less then 2 second capillary refill, no joint deformities, effusion, or inflammation, no skin discoloration  NEURO: alert & oriented x 3 with fluent speech, no focal motor/sensory deficits, gait normal   LABORATORY DATA: CBC    Component Value Date/Time   WBC 6.3 04/22/2015 1020   RBC 3.00* 04/22/2015 1020   RBC 2.45* 01/15/2015 0901   HGB 9.3* 04/22/2015 1020   HCT 29.2* 04/22/2015 1020   PLT 223 04/22/2015 1020   MCV 97.3 04/22/2015 1020   MCH 31.0 04/22/2015 1020   MCHC 31.8 04/22/2015 1020   RDW 18.6* 04/22/2015 1020   LYMPHSABS 1.7 04/22/2015 1020   MONOABS 0.6 04/22/2015 1020   EOSABS 0.4 04/22/2015 1020   BASOSABS 0.0 04/22/2015 1020      Chemistry      Component Value Date/Time   NA 132* 04/22/2015 1020   K 4.7 04/22/2015 1020   CL 100* 04/22/2015 1020   CO2 24 04/22/2015 1020   BUN 15 04/22/2015 1020   CREATININE 1.28* 04/22/2015 1020      Component Value Date/Time   CALCIUM 9.7 04/22/2015 1020   ALKPHOS 58 04/22/2015 1020   AST 22 04/22/2015 1020   ALT 12* 04/22/2015 1020   BILITOT 0.4 04/22/2015 1020        ASSESSMENT  AND PLAN:  Cancer of upper lobe of right lung Stage IV adenocarcinoma of the lung, currently on salvage Nivolumab with Zometa for bone support.  Due for treatment today if treatment parameters are met today.  Pre-chemo labs as ordered.  Restaging scans in  2 weeks to evaluate for progressive disease.  Will be vigilant for pseudo-progression as well.  She requests a referral to podiatry for ingrown toenail.  I will defer BP medications to her primary care provider.  Return in 2 weeks for review of data and treatment if indicated.    THERAPY PLAN:  Continue treatment as planned while being vigilant of progressive disease.  All questions were answered. The patient knows to call the clinic with any problems, questions or concerns. We can certainly see the patient much sooner if necessary.  Patient and plan discussed with Dr. Ancil Linsey and she is in agreement with the aforementioned.   This note is electronically signed by: Robynn Pane 04/22/2015 11:16 AM

## 2015-04-22 NOTE — Assessment & Plan Note (Addendum)
Stage IV adenocarcinoma of the lung, currently on salvage Nivolumab with Zometa for bone support.  Due for treatment today if treatment parameters are met today.  Pre-chemo labs as ordered.  Restaging scans in 2 weeks to evaluate for progressive disease.  Will be vigilant for pseudo-progression as well.  She requests a referral to podiatry for ingrown toenail.  I will defer BP medications to her primary care provider.  Return in 2 weeks for review of data and treatment if indicated.

## 2015-04-22 NOTE — Patient Instructions (Signed)
[facility] Discharge Instructions for Patients Receiving Chemotherapy  Today you received the following chemotherapy agents Opdivo. Continue Opdivo every 14 days. Zometa infusion today as ordered. Continue Zometa infusion every 28 days. Aranesp injection given today as ordered. We will give as needed in the future.  To help prevent nausea and vomiting after your treatment, we encourage you to take your nausea medication as instructed. If you develop nausea and vomiting that is not controlled by your nausea medication, call the clinic. If it is after clinic hours your family physician or the after hours number for the clinic or go to the Emergency Department. BELOW ARE SYMPTOMS THAT SHOULD BE REPORTED IMMEDIATELY:  *FEVER GREATER THAN 101.0 F  *CHILLS WITH OR WITHOUT FEVER  NAUSEA AND VOMITING THAT IS NOT CONTROLLED WITH YOUR NAUSEA MEDICATION  *UNUSUAL SHORTNESS OF BREATH  *UNUSUAL BRUISING OR BLEEDING  TENDERNESS IN MOUTH AND THROAT WITH OR WITHOUT PRESENCE OF ULCERS  *URINARY PROBLEMS  *BOWEL PROBLEMS  UNUSUAL RASH Items with * indicate a potential emergency and should be followed up as soon as possible. Return as scheduled.  I have been informed and understand all the instructions given to me. I know to contact the clinic, my physician, or go to the Emergency Department if any problems should occur. I do not have any questions at this time, but understand that I may call the clinic during office hours or the Patient Navigator at [phone] should I have any questions or need assistance in obtaining follow up care.    __________________________________________  _____________  __________ Signature of Patient or Authorized Representative            Date                   Time    __________________________________________ Nurse's Signature

## 2015-04-23 ENCOUNTER — Other Ambulatory Visit: Payer: Self-pay | Admitting: Radiation Oncology

## 2015-05-04 ENCOUNTER — Ambulatory Visit (HOSPITAL_COMMUNITY)
Admission: RE | Admit: 2015-05-04 | Discharge: 2015-05-04 | Disposition: A | Discharge status: Home / Self Care | Payer: Medicaid Other | Source: Ambulatory Visit | Attending: Oncology | Admitting: Oncology

## 2015-05-04 DIAGNOSIS — C3411 Malignant neoplasm of upper lobe, right bronchus or lung: Secondary | ICD-10-CM | POA: Diagnosis not present

## 2015-05-04 DIAGNOSIS — Z720 Tobacco use: Secondary | ICD-10-CM | POA: Insufficient documentation

## 2015-05-04 MED ORDER — IOHEXOL 300 MG/ML  SOLN
80.0000 mL | Freq: Once | INTRAMUSCULAR | Status: AC | PRN
  Administered 2015-05-04: 64 mL via INTRAVENOUS

## 2015-05-06 ENCOUNTER — Encounter (HOSPITAL_COMMUNITY): Payer: Self-pay | Admitting: Hematology & Oncology

## 2015-05-06 ENCOUNTER — Encounter (HOSPITAL_BASED_OUTPATIENT_CLINIC_OR_DEPARTMENT_OTHER): Payer: Medicaid Other | Admitting: Hematology & Oncology

## 2015-05-06 ENCOUNTER — Encounter (HOSPITAL_BASED_OUTPATIENT_CLINIC_OR_DEPARTMENT_OTHER): Payer: Medicaid Other

## 2015-05-06 VITALS — BP 97/65 | HR 78 | Temp 98.5°F | Resp 18

## 2015-05-06 VITALS — BP 103/59 | HR 81 | Temp 98.7°F | Resp 16 | Wt 81.0 lb

## 2015-05-06 DIAGNOSIS — C7972 Secondary malignant neoplasm of left adrenal gland: Secondary | ICD-10-CM

## 2015-05-06 DIAGNOSIS — C7971 Secondary malignant neoplasm of right adrenal gland: Secondary | ICD-10-CM

## 2015-05-06 DIAGNOSIS — Z5112 Encounter for antineoplastic immunotherapy: Secondary | ICD-10-CM | POA: Diagnosis present

## 2015-05-06 DIAGNOSIS — Z720 Tobacco use: Secondary | ICD-10-CM | POA: Diagnosis not present

## 2015-05-06 DIAGNOSIS — E278 Other specified disorders of adrenal gland: Secondary | ICD-10-CM

## 2015-05-06 DIAGNOSIS — C3411 Malignant neoplasm of upper lobe, right bronchus or lung: Secondary | ICD-10-CM

## 2015-05-06 DIAGNOSIS — C7951 Secondary malignant neoplasm of bone: Secondary | ICD-10-CM

## 2015-05-06 LAB — CBC WITH DIFFERENTIAL/PLATELET
BASOS ABS: 0 10*3/uL (ref 0.0–0.1)
Basophils Relative: 0 % (ref 0–1)
EOS ABS: 0.1 10*3/uL (ref 0.0–0.7)
Eosinophils Relative: 1 % (ref 0–5)
HEMATOCRIT: 33.7 % — AB (ref 36.0–46.0)
Hemoglobin: 10.6 g/dL — ABNORMAL LOW (ref 12.0–15.0)
LYMPHS PCT: 17 % (ref 12–46)
Lymphs Abs: 1.5 10*3/uL (ref 0.7–4.0)
MCH: 31.4 pg (ref 26.0–34.0)
MCHC: 31.5 g/dL (ref 30.0–36.0)
MCV: 99.7 fL (ref 78.0–100.0)
Monocytes Absolute: 1.3 10*3/uL — ABNORMAL HIGH (ref 0.1–1.0)
Monocytes Relative: 14 % — ABNORMAL HIGH (ref 3–12)
Neutro Abs: 6.2 10*3/uL (ref 1.7–7.7)
Neutrophils Relative %: 68 % (ref 43–77)
Platelets: 219 10*3/uL (ref 150–400)
RBC: 3.38 MIL/uL — AB (ref 3.87–5.11)
RDW: 19 % — ABNORMAL HIGH (ref 11.5–15.5)
WBC: 9.1 10*3/uL (ref 4.0–10.5)

## 2015-05-06 LAB — COMPREHENSIVE METABOLIC PANEL
ALBUMIN: 3.6 g/dL (ref 3.5–5.0)
ALT: 9 U/L — ABNORMAL LOW (ref 14–54)
ANION GAP: 6 (ref 5–15)
AST: 16 U/L (ref 15–41)
Alkaline Phosphatase: 65 U/L (ref 38–126)
BILIRUBIN TOTAL: 0.7 mg/dL (ref 0.3–1.2)
BUN: 14 mg/dL (ref 6–20)
CHLORIDE: 104 mmol/L (ref 101–111)
CO2: 23 mmol/L (ref 22–32)
CREATININE: 1.42 mg/dL — AB (ref 0.44–1.00)
Calcium: 9.3 mg/dL (ref 8.9–10.3)
GFR calc Af Amer: 46 mL/min — ABNORMAL LOW (ref >60)
GFR, EST NON AFRICAN AMERICAN: 40 mL/min — AB (ref >60)
Glucose, Bld: 90 mg/dL (ref 65–99)
Potassium: 4.1 mmol/L (ref 3.5–5.1)
Sodium: 133 mmol/L — ABNORMAL LOW (ref 135–145)
Total Protein: 8 g/dL (ref 6.5–8.1)

## 2015-05-06 LAB — TSH: TSH: 0.599 u[IU]/mL (ref 0.350–4.500)

## 2015-05-06 MED ORDER — SODIUM CHLORIDE 0.9 % IJ SOLN: 10.0000 mL | INTRAMUSCULAR | Status: DC | PRN

## 2015-05-06 MED ORDER — SODIUM CHLORIDE 0.9 % IV SOLN
3.0000 mg/kg | Freq: Once | INTRAVENOUS | Status: AC
  Administered 2015-05-06: 110 mg via INTRAVENOUS
  Filled 2015-05-06: qty 10

## 2015-05-06 MED ORDER — HEPARIN SOD (PORK) LOCK FLUSH 100 UNIT/ML IV SOLN
INTRAVENOUS | Status: AC
  Filled 2015-05-06: qty 5

## 2015-05-06 MED ORDER — HEPARIN SOD (PORK) LOCK FLUSH 100 UNIT/ML IV SOLN
500.0000 [IU] | Freq: Once | INTRAVENOUS | Status: AC | PRN
  Administered 2015-05-06: 500 [IU]

## 2015-05-06 MED ORDER — SODIUM CHLORIDE 0.9 % IV SOLN
Freq: Once | INTRAVENOUS | Status: AC
  Administered 2015-05-06: 11:00:00 via INTRAVENOUS

## 2015-05-06 MED ORDER — OXYCODONE HCL 10 MG PO TABS: 10.0000 mg | ORAL_TABLET | Freq: Four times a day (QID) | ORAL | Status: DC | PRN

## 2015-05-06 NOTE — Progress Notes (Signed)
Melissa Sullivan Tolerated chemotherapy well today Discharged ambulatory

## 2015-05-06 NOTE — Patient Instructions (Signed)
[facility] Discharge Instructions for Patients Receiving Chemotherapy  Today you received the following chemotherapy agents opdivo Please follow up as scheduled Call the clinic if you have any questions or concerns  To help prevent nausea and vomiting after your treatment, we encourage you to take your nausea medication  If you develop nausea and vomiting, or diarrhea that is not controlled by your medication, call the clinic.  The clinic phone number is [phone]. Office hours are Monday-Friday 8:30am-5:00pm.  BELOW ARE SYMPTOMS THAT SHOULD BE REPORTED IMMEDIATELY:  *FEVER GREATER THAN 101.0 F  *CHILLS WITH OR WITHOUT FEVER  NAUSEA AND VOMITING THAT IS NOT CONTROLLED WITH YOUR NAUSEA MEDICATION  *UNUSUAL SHORTNESS OF BREATH  *UNUSUAL BRUISING OR BLEEDING  TENDERNESS IN MOUTH AND THROAT WITH OR WITHOUT PRESENCE OF ULCERS  *URINARY PROBLEMS  *BOWEL PROBLEMS  UNUSUAL RASH Items with * indicate a potential emergency and should be followed up as soon as possible. If you have an emergency after office hours please contact your primary care physician or go to the nearest emergency department.  Please call the clinic during office hours if you have any questions or concerns.   You may also contact the Patient Navigator at [phone] should you have any questions or need assistance in obtaining follow up care. _____________________________________________________________________ Have you asked about our STAR program?    STAR stands for Survivorship Training and Rehabilitation, and this is a nationally recognized cancer care program that focuses on survivorship and rehabilitation.  Cancer and cancer treatments may cause problems, such as, pain, making you feel tired and keeping you from doing the things that you need or want to do. Cancer rehabilitation can help. Our goal is to reduce these troubling effects and help you have the best quality of life  possible.  You may receive a survey from a nurse that asks questions about your current state of health.  Based on the survey results, all eligible patients will be referred to the [facility] program for an evaluation so we can better serve you! A frequently asked questions sheet is available upon request.

## 2015-05-06 NOTE — Progress Notes (Signed)
[name], stage IV, bilateral adrenal metastases, retroperitoneal lymph nodes  Biopsy on 08/01/2014 c/w poorly differentiated adenocarcinoma  XRT completed on 10/27/2014  Right Brachial plexus neuralgia  EGFR mutation negative ALK mutation negative  Extensive soft tissue thickening in the posterior nasopharynx with bilateral level IIa lymphadenopathy in the neck. The possibility of a second primary neoplasm in this region warrants consideration. Clinical correlation is recommended.  CURRENT THERAPY: Nivolumab/zometa/Aranesp  INTERVAL HISTORY: Melissa Sullivan 59 y.o. female returns for follow-up of stage IV adenocarcinoma of the lung. She is doing well.  She sleeps ok. She denies any pain. Her mood is good. She notes some days she is "too busy." She has no other major complaints. Her coughing is better. Her abdominal pain is better.   She sees Dr. Lisbeth Renshaw tomorrow and mentions he talked about XRT to the adrenal lesion. She does not want to pursue radiation.  She is trying to cut back on smoking, her family have made her aware that treatment will work better. She notes that it is extremely difficult for her to quit.  MEDICAL HISTORY: Past Medical History  Diagnosis Date  . Hypertension   . Reflux   . Hyperlipidemia   . Lung mass 07/28/2014  . Adrenal mass, right 07/28/2014  . Diabetes mellitus without complication   . Cancer     lung  right    has Adrenal mass, right; Cancer of upper lobe of right lung; ARF (acute renal failure); Abnormal finding on imaging; Oropharyngeal dysphagia; Nausea and vomiting; Hypercalcemia; Anemia in neoplastic disease; Dysphagia; and Encounter for screening colonoscopy on her problem list.      Cancer of upper lobe of right lung   07/28/2014 Imaging CT chest: Large R apical mass consistent with malignancy. This is destroying the R 2nd rib with extension into adjacent soft tissue. R hilar adenopathy with R 5cm adrenal metastatic lesion.   08/01/2014 Initial Biopsy Lung, needle/core biopsy(ies), right upper lobe - POORLY DIFFERENTIATED ADENOCARCINOMA, SEE COMMENT.   08/08/2014 PET scan Large hypermetabolic R apical mass with evidence of direct chest wall and mediastinal invasion, right retrocrural lymphadenopathy, extensive retroperitoneal lymphadenopathy, and metastatic lesions to the adrenal glands    09/02/2014 - 11/04/2014 Chemotherapy Cisplatin/Pemetrexed/Avastin every 21 days x 4 cycles   10/07/2014 - 10/27/2014 Radiation Therapy Right lung apex for control of brachioplexopathy.   12/24/2014 - 02/25/2015 Chemotherapy Alimta/Avastin every 21 days.   02/20/2015 Imaging Increase in size of right adrenal metastasis and subjacent confluent retrocaval lymphadenopathy   02/25/2015 -  Chemotherapy Nivolumab, zometa     has No Known Allergies.  Melissa Sullivan had no medications administered during this visit.  SURGICAL HISTORY: Past Surgical History  Procedure Laterality Date  . Appendectomy    . Lung biopsy Right 07/2014    CT guided  . Portacath placement Left 09/01/2014  . Esophagogastroduodenoscopy N/A 12/09/2014    YBO:FBPZWC esophageal stricture/mild-to-noderate erosive gastritis. negative H.pylori  . Savory dilation N/A 12/09/2014    Procedure: SAVORY DILATION;  Surgeon: [name], MD;  Location: [facility];  Service: Endoscopy;  Laterality: N/A;  [name] dilation N/A 12/09/2014    Procedure: [name] DILATION;  Surgeon: [name], MD;  Location: [facility];  Service: Endoscopy;  Laterality: N/A;  . Flexible sigmoidoscopy  2011    Dr. [name]: hyperplastic polyp    SOCIAL HISTORY: History   Social History  . Marital Status: Married    Spouse Name: N/A  . Number of Children: N/A  .  Years of Education: N/A   Occupational History  . Not on file.   Social History Main Topics  . Smoking status: Light Tobacco Smoker -- 0.30 packs/day    Types: Cigarettes  . Smokeless tobacco: Never Used  . Alcohol Use: No  .  Drug Use: No  . Sexual Activity: Not on file   Other Topics Concern  . Not on file   Social History Narrative    FAMILY HISTORY: Family History  Problem Relation Age of Onset  . Cancer Sister     Review of Systems  Constitutional: Negative for fatigue. Negative for fever and chills.  HENT: Negative for congestion, hearing loss, nosebleeds, sore throat and tinnitus.   Eyes: Negative for blurred vision, double vision, pain and discharge.  Respiratory: Negative for cough, hemoptysis, sputum production, shortness of breath and wheezing.   Cardiovascular: Negative for chest pain, palpitations, claudication, leg swelling and PND.  Gastrointestinal: . Negative for heartburn, nausea, vomiting, diarrhea, constipation, blood in stool and melena.  Genitourinary: Negative for dysuria, urgency, frequency and hematuria.  Musculoskeletal: Negative for myalgias, joint pain and falls.  Skin: Negative for itching and rash.  Neurological: Negative for dizziness, tingling, tremors, sensory change, speech change, focal weakness, seizures, loss of consciousness, weakness and headaches.  Endo/Heme/Allergies: Does not bruise/bleed easily.  Psychiatric/Behavioral: Negative for depression, suicidal ideas, memory loss and substance abuse. The patient is not nervous/anxious and does not have insomnia.    PHYSICAL EXAMINATION  ECOG PERFORMANCE STATUS: 1 - Symptomatic but completely ambulatory  Filed Vitals:   05/06/15 0906  BP: 103/59  Pulse: 81  Temp: 98.7 F (37.1 C)  Resp: 16    Physical Exam  Constitutional: She is oriented to person, place, and time.  HENT:  Head: Normocephalic and atraumatic.  Nose: Nose normal.  Mouth/Throat: Oropharynx is clear and moist. No oropharyngeal exudate.  Eyes: Conjunctivae and EOM are normal. Pupils are equal, round, and reactive to light. Right eye exhibits no discharge. Left eye exhibits no discharge. No scleral icterus.  Neck: Normal range of motion. Neck  supple. No tracheal deviation present. No thyromegaly present.  Cardiovascular: Normal rate, regular rhythm and normal heart sounds.  Exam reveals no gallop and no friction rub.   No murmur heard. Pulmonary/Chest: Effort normal and breath sounds normal. She has no wheezes. She has no rales.  Abdominal: Soft. Bowel sounds are normal. She exhibits no distension and no mass. There is no tenderness. There is no rebound and no guarding.  Thin  Musculoskeletal: Normal range of motion. She exhibits no edema.  Lymphadenopathy:    She has no cervical adenopathy.  Neurological: She is alert and oriented to person, place, and time. She has normal reflexes. No cranial nerve deficit. Gait normal. Coordination normal.  Skin: Skin is warm and dry. No rash noted.  Psychiatric: Mood, memory, affect and judgment normal.  Nursing note and vitals reviewed.   LABORATORY DATA: CBC    Component Value Date/Time   WBC 9.1 05/06/2015 0844   RBC 3.38* 05/06/2015 0844   RBC 2.45* 01/15/2015 0901   HGB 10.6* 05/06/2015 0844   HCT 33.7* 05/06/2015 0844   PLT 219 05/06/2015 0844   MCV 99.7 05/06/2015 0844   MCH 31.4 05/06/2015 0844   MCHC 31.5 05/06/2015 0844   RDW 19.0* 05/06/2015 0844   LYMPHSABS 1.5 05/06/2015 0844   MONOABS 1.3* 05/06/2015 0844   EOSABS 0.1 05/06/2015 0844   BASOSABS 0.0 05/06/2015 0844   CMP     Component Value  Date/Time   NA 133* 05/06/2015 0844   K 4.1 05/06/2015 0844   CL 104 05/06/2015 0844   CO2 23 05/06/2015 0844   GLUCOSE 90 05/06/2015 0844   BUN 14 05/06/2015 0844   CREATININE 1.42* 05/06/2015 0844   CALCIUM 9.3 05/06/2015 0844   PROT 8.0 05/06/2015 0844   ALBUMIN 3.6 05/06/2015 0844   AST 16 05/06/2015 0844   ALT 9* 05/06/2015 0844   ALKPHOS 65 05/06/2015 0844   BILITOT 0.7 05/06/2015 0844   GFRNONAA 40* 05/06/2015 0844   GFRAA 46* 05/06/2015 0844   RADIOLOGY:  CLINICAL DATA: Subsequent encounter for non-small-cell lung cancer  EXAM: CT CHEST, ABDOMEN, AND  PELVIS WITH CONTRAST  TECHNIQUE: Multidetector CT imaging of the chest, abdomen and pelvis was performed following the standard protocol during bolus administration of intravenous contrast.  CONTRAST: 30m OMNIPAQUE IOHEXOL 300 MG/ML SOLN  COMPARISON: 02/20/2015. IMPRESSION: 1. Stable to slight decrease in the posterior right apical lesion. 2. Stable appearance of posterior right upper rib an upper thoracic bony lesions. 3. Slight improvement in right upper lobe tree-in-bud opacity. No new or progressive findings in the lungs. 4. Interval decrease in right adrenal metastatic lesion and associated aortocaval lymphadenopathy.   Electronically Signed  By: EMisty StanleyM.D.  On: 05/04/2015 12:03   ASSESSMENT and THERAPY PLAN:   Stage IV adenocarcinoma of the lung, EGFR and ALK negative  We reviewed her CT imaging which overall shows improved disease. She is certainly doing well clinically. We will continue with current therapy. She has absolutely no major complaints today. Again reviewed the potential side effects of her current treatment. In regards to XRT for her adrenal lesion, I have advised her to discuss this with radiation tomorrow. Given that her disease is better she may be able to defer.  Hypercalcemia/bony metastatic disease  She will continue with Zometa as prescribed.  Tobacco ABUSE  I re-emphasized the benefits of smoking cessation. I have offered her methods to quit in the past. She will continue to think on this.  We will plan on a four week follow-up with laboratory studies, chemotherapy and an office visit. She will continue with every 2 week nivolumab.  All questions were answered. The patient knows to call the clinic with any problems, questions or concerns. We can certainly see the patient much sooner if necessary.   This document serves as a record of services personally performed by SAncil Linsey MD. It was created on her behalf by  EArlyce Harman a trained medical scribe. The creation of this record is based on the scribe's personal observations and the provider's statements to them. This document has been checked and approved by the attending provider.  This note was signed electronically  SLarene BeachK. PWhitney Muse MD

## 2015-05-13 ENCOUNTER — Ambulatory Visit (HOSPITAL_COMMUNITY): Payer: Medicaid Other

## 2015-05-13 ENCOUNTER — Other Ambulatory Visit (HOSPITAL_COMMUNITY): Payer: Medicaid Other

## 2015-05-20 ENCOUNTER — Inpatient Hospital Stay (HOSPITAL_COMMUNITY): Payer: Medicaid Other

## 2015-05-20 ENCOUNTER — Encounter (HOSPITAL_BASED_OUTPATIENT_CLINIC_OR_DEPARTMENT_OTHER): Payer: Medicaid Other

## 2015-05-20 ENCOUNTER — Encounter: Payer: Self-pay | Admitting: *Deleted

## 2015-05-20 ENCOUNTER — Other Ambulatory Visit (HOSPITAL_COMMUNITY): Payer: Self-pay | Admitting: Oncology

## 2015-05-20 VITALS — BP 110/72 | HR 82 | Temp 98.2°F | Resp 16 | Wt 82.0 lb

## 2015-05-20 DIAGNOSIS — Z5112 Encounter for antineoplastic immunotherapy: Secondary | ICD-10-CM | POA: Diagnosis not present

## 2015-05-20 DIAGNOSIS — Z720 Tobacco use: Secondary | ICD-10-CM

## 2015-05-20 DIAGNOSIS — C3411 Malignant neoplasm of upper lobe, right bronchus or lung: Secondary | ICD-10-CM | POA: Diagnosis not present

## 2015-05-20 DIAGNOSIS — E278 Other specified disorders of adrenal gland: Secondary | ICD-10-CM

## 2015-05-20 DIAGNOSIS — D630 Anemia in neoplastic disease: Secondary | ICD-10-CM

## 2015-05-20 DIAGNOSIS — C7951 Secondary malignant neoplasm of bone: Secondary | ICD-10-CM | POA: Diagnosis not present

## 2015-05-20 LAB — DIFFERENTIAL
Basophils Absolute: 0 10*3/uL (ref 0.0–0.1)
Basophils Relative: 0 % (ref 0–1)
EOS ABS: 0.3 10*3/uL (ref 0.0–0.7)
Eosinophils Relative: 5 % (ref 0–5)
LYMPHS PCT: 26 % (ref 12–46)
Lymphs Abs: 1.7 10*3/uL (ref 0.7–4.0)
Monocytes Absolute: 0.7 10*3/uL (ref 0.1–1.0)
Monocytes Relative: 10 % (ref 3–12)
Neutro Abs: 3.9 10*3/uL (ref 1.7–7.7)
Neutrophils Relative %: 59 % (ref 43–77)

## 2015-05-20 LAB — COMPREHENSIVE METABOLIC PANEL
ALBUMIN: 3.9 g/dL (ref 3.5–5.0)
ALT: 11 U/L — AB (ref 14–54)
AST: 18 U/L (ref 15–41)
Alkaline Phosphatase: 63 U/L (ref 38–126)
Anion gap: 6 (ref 5–15)
BILIRUBIN TOTAL: 0.4 mg/dL (ref 0.3–1.2)
BUN: 21 mg/dL — ABNORMAL HIGH (ref 6–20)
CALCIUM: 10.5 mg/dL — AB (ref 8.9–10.3)
CO2: 26 mmol/L (ref 22–32)
Chloride: 101 mmol/L (ref 101–111)
Creatinine, Ser: 1.38 mg/dL — ABNORMAL HIGH (ref 0.44–1.00)
GFR calc Af Amer: 48 mL/min — ABNORMAL LOW (ref >60)
GFR calc non Af Amer: 41 mL/min — ABNORMAL LOW (ref >60)
Glucose, Bld: 90 mg/dL (ref 65–99)
POTASSIUM: 4.4 mmol/L (ref 3.5–5.1)
Sodium: 133 mmol/L — ABNORMAL LOW (ref 135–145)
Total Protein: 8.2 g/dL — ABNORMAL HIGH (ref 6.5–8.1)

## 2015-05-20 LAB — CBC
HCT: 38.7 % (ref 36.0–46.0)
Hemoglobin: 12 g/dL (ref 12.0–15.0)
MCH: 30.7 pg (ref 26.0–34.0)
MCHC: 31 g/dL (ref 30.0–36.0)
MCV: 99 fL (ref 78.0–100.0)
Platelets: 268 10*3/uL (ref 150–400)
RBC: 3.91 MIL/uL (ref 3.87–5.11)
RDW: 17.2 % — AB (ref 11.5–15.5)
WBC: 6.8 10*3/uL (ref 4.0–10.5)

## 2015-05-20 MED ORDER — HEPARIN SOD (PORK) LOCK FLUSH 100 UNIT/ML IV SOLN
500.0000 [IU] | Freq: Once | INTRAVENOUS | Status: AC | PRN
  Administered 2015-05-20: 500 [IU]
  Filled 2015-05-20: qty 5

## 2015-05-20 MED ORDER — NICOTINE 7 MG/24HR TD PT24: 7.0000 mg | MEDICATED_PATCH | Freq: Every day | TRANSDERMAL | Status: DC

## 2015-05-20 MED ORDER — ZOLEDRONIC ACID 4 MG/5ML IV CONC
3.5000 mg | Freq: Once | INTRAVENOUS | Status: AC
  Administered 2015-05-20: 3.5 mg via INTRAVENOUS
  Filled 2015-05-20: qty 4.38

## 2015-05-20 MED ORDER — NICOTINE 14 MG/24HR TD PT24: 14.0000 mg | MEDICATED_PATCH | Freq: Every day | TRANSDERMAL | Status: DC

## 2015-05-20 MED ORDER — SODIUM CHLORIDE 0.9 % IV SOLN
3.0000 mg/kg | Freq: Once | INTRAVENOUS | Status: AC
  Administered 2015-05-20: 110 mg via INTRAVENOUS
  Filled 2015-05-20: qty 1

## 2015-05-20 MED ORDER — SODIUM CHLORIDE 0.9 % IV SOLN
Freq: Once | INTRAVENOUS | Status: AC
  Administered 2015-05-20: 11:00:00 via INTRAVENOUS

## 2015-05-20 MED ORDER — NICOTINE 21 MG/24HR TD PT24: 21.0000 mg | MEDICATED_PATCH | Freq: Every day | TRANSDERMAL | Status: DC

## 2015-05-20 MED ORDER — SODIUM CHLORIDE 0.9 % IJ SOLN
10.0000 mL | INTRAMUSCULAR | Status: DC | PRN
  Administered 2015-05-20: 10 mL
  Filled 2015-05-20: qty 10

## 2015-05-20 NOTE — Progress Notes (Signed)
[place] Clinical Social Work  Clinical Social Work was referred by [place] rounding for re-assessment of needs. Clinical Social Worker met with patient and her daughter at [facility] to offer support and assess for needs. Pt reports she still has not heard from [employer] about nutrition request. CSW to make referral to dietician for possible follow up and additional nutritional resources to assist. Pt stated she needs a letter from MD stating she is still disabled and still has cancer for her life insurance policy through her previous employer. CSW to notify MD.  CSW encouraged pt to attend Cancer Support Group next month. She will consider.   Clinical Social Work interventions: Theme park manager education Pt advocacy  [name], [place] Tuesdays 8:30-1pm Wednesdays 8:30-12pm  [phone]

## 2015-05-20 NOTE — Patient Instructions (Signed)
.  [facility] Discharge Instructions for Patients Receiving Chemotherapy  Today you received the following chemotherapy agents: [place]  Prescription given for Nicoderm and letter given for [employer].  Calendar given to patient.     If you develop nausea and vomiting, or diarrhea that is not controlled by your medication, call the clinic.  The clinic phone number is [phone]. Office hours are Monday-Friday 8:30am-5:00pm.  BELOW ARE SYMPTOMS THAT SHOULD BE REPORTED IMMEDIATELY:  *FEVER GREATER THAN 101.0 F  *CHILLS WITH OR WITHOUT FEVER  NAUSEA AND VOMITING THAT IS NOT CONTROLLED WITH YOUR NAUSEA MEDICATION  *UNUSUAL SHORTNESS OF BREATH  *UNUSUAL BRUISING OR BLEEDING  TENDERNESS IN MOUTH AND THROAT WITH OR WITHOUT PRESENCE OF ULCERS  *URINARY PROBLEMS  *BOWEL PROBLEMS  UNUSUAL RASH Items with * indicate a potential emergency and should be followed up as soon as possible. If you have an emergency after office hours please contact your primary care physician or go to the nearest emergency department.  Please call the clinic during office hours if you have any questions or concerns.   You may also contact the Patient Navigator at [phone] should you have any questions or need assistance in obtaining follow up care. _____________________________________________________________________ Have you asked about our STAR program?    STAR stands for Survivorship Training and Rehabilitation, and this is a nationally recognized cancer care program that focuses on survivorship and rehabilitation.  Cancer and cancer treatments may cause problems, such as, pain, making you feel tired and keeping you from doing the things that you need or want to do. Cancer rehabilitation can help. Our goal is to reduce these troubling effects and help you have the best quality of life possible.  You may receive a survey from a nurse that asks questions about your  current state of health.  Based on the survey results, all eligible patients will be referred to the [facility] program for an evaluation so we can better serve you! A frequently asked questions sheet is available upon request.

## 2015-06-03 ENCOUNTER — Ambulatory Visit (HOSPITAL_COMMUNITY): Payer: Medicaid Other

## 2015-06-03 ENCOUNTER — Encounter (HOSPITAL_COMMUNITY): Payer: Medicaid Other | Attending: Hematology and Oncology

## 2015-06-03 ENCOUNTER — Encounter (HOSPITAL_COMMUNITY): Payer: Self-pay | Admitting: Oncology

## 2015-06-03 ENCOUNTER — Inpatient Hospital Stay (HOSPITAL_COMMUNITY): Payer: Medicaid Other

## 2015-06-03 ENCOUNTER — Encounter (HOSPITAL_BASED_OUTPATIENT_CLINIC_OR_DEPARTMENT_OTHER): Payer: Medicaid Other | Admitting: Oncology

## 2015-06-03 VITALS — BP 119/79 | HR 95 | Temp 98.4°F | Resp 20 | Wt 82.6 lb

## 2015-06-03 VITALS — BP 118/63 | HR 72 | Temp 98.4°F | Resp 18

## 2015-06-03 DIAGNOSIS — C772 Secondary and unspecified malignant neoplasm of intra-abdominal lymph nodes: Secondary | ICD-10-CM | POA: Insufficient documentation

## 2015-06-03 DIAGNOSIS — I10 Essential (primary) hypertension: Secondary | ICD-10-CM | POA: Diagnosis not present

## 2015-06-03 DIAGNOSIS — Z79899 Other long term (current) drug therapy: Secondary | ICD-10-CM | POA: Diagnosis not present

## 2015-06-03 DIAGNOSIS — C3411 Malignant neoplasm of upper lobe, right bronchus or lung: Secondary | ICD-10-CM | POA: Diagnosis not present

## 2015-06-03 DIAGNOSIS — C781 Secondary malignant neoplasm of mediastinum: Secondary | ICD-10-CM | POA: Insufficient documentation

## 2015-06-03 DIAGNOSIS — F17210 Nicotine dependence, cigarettes, uncomplicated: Secondary | ICD-10-CM | POA: Insufficient documentation

## 2015-06-03 DIAGNOSIS — C7989 Secondary malignant neoplasm of other specified sites: Secondary | ICD-10-CM | POA: Insufficient documentation

## 2015-06-03 DIAGNOSIS — E785 Hyperlipidemia, unspecified: Secondary | ICD-10-CM | POA: Insufficient documentation

## 2015-06-03 DIAGNOSIS — J449 Chronic obstructive pulmonary disease, unspecified: Secondary | ICD-10-CM | POA: Insufficient documentation

## 2015-06-03 DIAGNOSIS — Z720 Tobacco use: Secondary | ICD-10-CM

## 2015-06-03 DIAGNOSIS — C7972 Secondary malignant neoplasm of left adrenal gland: Secondary | ICD-10-CM | POA: Diagnosis not present

## 2015-06-03 DIAGNOSIS — C7951 Secondary malignant neoplasm of bone: Secondary | ICD-10-CM | POA: Diagnosis not present

## 2015-06-03 DIAGNOSIS — C7971 Secondary malignant neoplasm of right adrenal gland: Secondary | ICD-10-CM | POA: Insufficient documentation

## 2015-06-03 DIAGNOSIS — E278 Other specified disorders of adrenal gland: Secondary | ICD-10-CM

## 2015-06-03 DIAGNOSIS — Z9089 Acquired absence of other organs: Secondary | ICD-10-CM | POA: Insufficient documentation

## 2015-06-03 DIAGNOSIS — K219 Gastro-esophageal reflux disease without esophagitis: Secondary | ICD-10-CM | POA: Insufficient documentation

## 2015-06-03 DIAGNOSIS — Z5112 Encounter for antineoplastic immunotherapy: Secondary | ICD-10-CM | POA: Diagnosis not present

## 2015-06-03 LAB — COMPREHENSIVE METABOLIC PANEL
ALBUMIN: 3.8 g/dL (ref 3.5–5.0)
ALT: 9 U/L — AB (ref 14–54)
AST: 18 U/L (ref 15–41)
Alkaline Phosphatase: 68 U/L (ref 38–126)
Anion gap: 7 (ref 5–15)
BUN: 31 mg/dL — ABNORMAL HIGH (ref 6–20)
CO2: 27 mmol/L (ref 22–32)
Calcium: 10.5 mg/dL — ABNORMAL HIGH (ref 8.9–10.3)
Chloride: 99 mmol/L — ABNORMAL LOW (ref 101–111)
Creatinine, Ser: 1.71 mg/dL — ABNORMAL HIGH (ref 0.44–1.00)
GFR calc Af Amer: 37 mL/min — ABNORMAL LOW (ref >60)
GFR calc non Af Amer: 32 mL/min — ABNORMAL LOW (ref >60)
Glucose, Bld: 88 mg/dL (ref 65–99)
Potassium: 4.1 mmol/L (ref 3.5–5.1)
Sodium: 133 mmol/L — ABNORMAL LOW (ref 135–145)
Total Bilirubin: 0.3 mg/dL (ref 0.3–1.2)
Total Protein: 7.9 g/dL (ref 6.5–8.1)

## 2015-06-03 LAB — CBC WITH DIFFERENTIAL/PLATELET
BASOS ABS: 0 10*3/uL (ref 0.0–0.1)
BASOS PCT: 0 % (ref 0–1)
Eosinophils Absolute: 0.5 10*3/uL (ref 0.0–0.7)
Eosinophils Relative: 6 % — ABNORMAL HIGH (ref 0–5)
HEMATOCRIT: 37 % (ref 36.0–46.0)
Hemoglobin: 11.8 g/dL — ABNORMAL LOW (ref 12.0–15.0)
Lymphocytes Relative: 22 % (ref 12–46)
Lymphs Abs: 1.9 10*3/uL (ref 0.7–4.0)
MCH: 30.6 pg (ref 26.0–34.0)
MCHC: 31.9 g/dL (ref 30.0–36.0)
MCV: 95.9 fL (ref 78.0–100.0)
MONO ABS: 0.9 10*3/uL (ref 0.1–1.0)
Monocytes Relative: 10 % (ref 3–12)
NEUTROS PCT: 62 % (ref 43–77)
Neutro Abs: 5.4 10*3/uL (ref 1.7–7.7)
PLATELETS: 229 10*3/uL (ref 150–400)
RBC: 3.86 MIL/uL — ABNORMAL LOW (ref 3.87–5.11)
RDW: 16.4 % — AB (ref 11.5–15.5)
WBC: 8.8 10*3/uL (ref 4.0–10.5)

## 2015-06-03 LAB — CALCIUM: CALCIUM: 9.8 mg/dL (ref 8.9–10.3)

## 2015-06-03 LAB — TSH: TSH: 0.853 u[IU]/mL (ref 0.350–4.500)

## 2015-06-03 MED ORDER — OXYCODONE HCL 10 MG PO TABS: 10.0000 mg | ORAL_TABLET | Freq: Four times a day (QID) | ORAL | Status: DC | PRN

## 2015-06-03 MED ORDER — SODIUM CHLORIDE 0.9 % IV SOLN
Freq: Once | INTRAVENOUS | Status: AC
Start: 2015-06-03 — End: 2015-06-03
  Administered 2015-06-03: 11:00:00 via INTRAVENOUS

## 2015-06-03 MED ORDER — SODIUM CHLORIDE 0.9 % IV SOLN
3.0000 mg/kg | Freq: Once | INTRAVENOUS | Status: AC
  Administered 2015-06-03: 110 mg via INTRAVENOUS
  Filled 2015-06-03: qty 10

## 2015-06-03 MED ORDER — SODIUM CHLORIDE 0.9 % IJ SOLN: 10.0000 mL | INTRAMUSCULAR | Status: DC | PRN

## 2015-06-03 MED ORDER — HEPARIN SOD (PORK) LOCK FLUSH 100 UNIT/ML IV SOLN
INTRAVENOUS | Status: AC
  Filled 2015-06-03: qty 5

## 2015-06-03 MED ORDER — HEPARIN SOD (PORK) LOCK FLUSH 100 UNIT/ML IV SOLN
500.0000 [IU] | Freq: Once | INTRAVENOUS | Status: AC | PRN
  Administered 2015-06-03: 500 [IU]

## 2015-06-03 NOTE — Patient Instructions (Addendum)
[facility] Discharge Instructions for Patients Receiving Chemotherapy  Today you received the following chemotherapy agents opdivo Kidney function increased.  Additional fluids today. Calcium elevated and additional blood work completed Follow up as scheduled Please call the clinic if you have any questions or concerns  To help prevent nausea and vomiting after your treatment, we encourage you to take your nausea medication If you develop nausea and vomiting, or diarrhea that is not controlled by your medication, call the clinic.  The clinic phone number is [phone]. Office hours are Monday-Friday 8:30am-5:00pm.  BELOW ARE SYMPTOMS THAT SHOULD BE REPORTED IMMEDIATELY:  *FEVER GREATER THAN 101.0 F  *CHILLS WITH OR WITHOUT FEVER  NAUSEA AND VOMITING THAT IS NOT CONTROLLED WITH YOUR NAUSEA MEDICATION  *UNUSUAL SHORTNESS OF BREATH  *UNUSUAL BRUISING OR BLEEDING  TENDERNESS IN MOUTH AND THROAT WITH OR WITHOUT PRESENCE OF ULCERS  *URINARY PROBLEMS  *BOWEL PROBLEMS  UNUSUAL RASH Items with * indicate a potential emergency and should be followed up as soon as possible. If you have an emergency after office hours please contact your primary care physician or go to the nearest emergency department.  Please call the clinic during office hours if you have any questions or concerns.   You may also contact the Patient Navigator at [phone] should you have any questions or need assistance in obtaining follow up care. _____________________________________________________________________ Have you asked about our STAR program?    STAR stands for Survivorship Training and Rehabilitation, and this is a nationally recognized cancer care program that focuses on survivorship and rehabilitation.  Cancer and cancer treatments may cause problems, such as, pain, making you feel tired and keeping you from doing the things that you need or want to do. Cancer rehabilitation can  help. Our goal is to reduce these troubling effects and help you have the best quality of life possible.  You may receive a survey from a nurse that asks questions about your current state of health.  Based on the survey results, all eligible patients will be referred to the [facility] program for an evaluation so we can better serve you! A frequently asked questions sheet is available upon request.

## 2015-06-03 NOTE — Assessment & Plan Note (Addendum)
Stage IV right adenocarcinoma pancoast tumor of the lung (EGFR and ALK negative) complicated by right brachial plexus neuralgia.  S/P XRT.  Currently on Nivolumab salvage therapy with Zometa for Hypercalcemia.  Recent CT imaging in June 2016 demonstrates a response to therapy thus far.  Due for treatment today if treatment parameters are met today.  Treatment parameters are met for treatment today.  Her renal function has changed and therefore, we will give 1 L of NS today.  Additionally, her hypercalcemia persists despite Zometa 2 weeks ago.  Her correct calcium is 10.7.  We will give 1 L of NS, recheck Ca++.  If still elevated, I will order Zometa today.  Additionally, I ordered a PTH and PTH-RP today.  She is not taking calcium supplements, but of note, she is HCTZ which can increase plasma levels of HCTZ up to 11.5 mg/dL due to thiazide-induced calcium urinary retention.  She is presently asymptomatic from hypercalcemia.   Pre-chemo labs as ordered.  Return in 4 weeks for follow-up with continuation of pre-chemo labs and treatment every 2 weeks.

## 2015-06-03 NOTE — Patient Instructions (Signed)
[facility] Discharge Instructions  RECOMMENDATIONS MADE BY THE CONSULTANT AND ANY TEST RESULTS WILL BE SENT TO YOUR REFERRING PHYSICIAN.    Thank you for choosing  [facility] to provide your oncology and hematology care.  To afford each patient quality time with our provider, please arrive at least 15 minutes before your scheduled appointment time.    You need to re-schedule your appointment should you arrive 10 or more minutes late.  We strive to give you quality time with our providers, and arriving late affects you and other patients whose appointments are after yours.  Also, if you no show three or more times for appointments you may be dismissed from the clinic at the providers discretion.     Again, thank you for choosing [facility].  Our hope is that these requests will decrease the amount of time that you wait before being seen by our physicians.       _____________________________________________________________  Should you have questions after your visit to [facility], please contact our office at [phone] between the hours of 8:30 a.m. and 4:30 p.m.  Voicemails left after 4:30 p.m. will not be returned until the following business day.  For prescription refill requests, have your pharmacy contact our office.    

## 2015-06-03 NOTE — Progress Notes (Signed)
Hemoglobin 11.8 WNL no aranesp needed  [place] [facility] notified of creatine 1.71 orders received for additional fluids- 1000 mL of NS given  [place] reason for visit today is for labs as scheduled per MD orders.  Venipuncture performed with a 23 gauge butterfly needle to R Antecubital.  Melissa Sullivan tolerated procedure well and without incident; questions were answered and patient was discharged.  calicium level re-checked, now WNL. Notified Dr Whitney Muse ok to discharge

## 2015-06-03 NOTE — Progress Notes (Signed)
Melissa Cahill, MD  [place] [address]  Cancer of upper lobe of right lung - Plan: Oxycodone HCl 10 MG TABS  Hypercalcemia - Plan: PTH, intact and calcium, PTH-Related Peptide, PTH, intact and calcium, PTH-Related Peptide, Calcium, Calcium  CURRENT THERAPY: Nivolumab/zometa  INTERVAL HISTORY: Melissa Sullivan 59 y.o. 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    Cancer of upper lobe of right lung   07/28/2014 Imaging CT chest: Large R apical mass consistent with malignancy. This is destroying the R 2nd rib with extension into adjacent soft tissue. R hilar adenopathy with R 5cm adrenal metastatic lesion.   08/01/2014 Initial Biopsy Lung, needle/core biopsy(ies), right upper lobe - POORLY DIFFERENTIATED ADENOCARCINOMA, SEE COMMENT.   08/08/2014 PET scan Large hypermetabolic R apical mass with evidence of direct chest wall and mediastinal invasion, right retrocrural lymphadenopathy, extensive retroperitoneal lymphadenopathy, and metastatic lesions to the adrenal glands    09/02/2014 - 11/04/2014 Chemotherapy Cisplatin/Pemetrexed/Avastin every 21 days x 4 cycles   10/07/2014 - 10/27/2014 Radiation Therapy Right lung apex for control of brachioplexopathy.   12/24/2014 - 02/25/2015 Chemotherapy Alimta/Avastin every 21 days.   02/20/2015 Imaging Increase in size of right adrenal metastasis and subjacent confluent retrocaval lymphadenopathy   02/25/2015 -  Chemotherapy Nivolumab, zometa   05/04/2015 Imaging CT CAP- Stable to slight decrease in the posterior right apical lesion. Stable appearance of posterior right upper rib an upper thoracic bony lesions. Slight improvement in right upper lobe tree-in-bud opacity. No new or progressive findings in...   I personally reviewed and went over laboratory results with the patient.  The results are noted within this dictation.   There are a few things noted in her lab work.  She has had a bump in her Cr and BUN.  This is above baseline.  Additionally, hypercalcemia persist.  She reports a 2 day history of diarrhea that occurred last week and has since resolved.  She denies any loose stools today.  Weight is stable.  She is using the Nicotine patch to help her quite.  She admits smoking 1-2 cigarettes when she switches her patch.  She has an upcoming appointment with podiatry.  Oncologically, she denies any complaints and ROS questioning is negative.   Past Medical History  Diagnosis Date  . Hypertension   . Reflux   . Hyperlipidemia   . Lung mass 07/28/2014  . Adrenal mass, right 07/28/2014  . Diabetes mellitus without complication   . Cancer     lung  right    has Adrenal mass, right; Cancer of upper lobe of right lung; ARF (acute renal failure); Abnormal finding on imaging; Oropharyngeal dysphagia; Nausea and vomiting; Hypercalcemia; Anemia in neoplastic disease; Dysphagia; and Encounter for screening colonoscopy on her problem list.     has No Known Allergies.  Current Outpatient Prescriptions on File Prior to Visit  Medication Sig Dispense Refill  . Alum & Mag Hydroxide-Simeth (MAGIC MOUTHWASH W/LIDOCAINE) SOLN Take 5 mLs by mouth 4 (four) times daily as needed for mouth pain. 400 mL 4  . Fluticasone-Salmeterol (ADVAIR DISKUS) 500-50 MCG/DOSE AEPB Two puffs at the same time daily. 60 each 6  . folic acid (FOLVITE) 1 MG tablet Take 1 mg by mouth every morning.    . hydrochlorothiazide (MICROZIDE) 12.5 MG capsule Take 12.5 mg by mouth every morning.     Marland Kitchen  HYDROcodone-homatropine (HYCODAN) 5-1.5 MG/5ML syrup Take 5 mLs by mouth every 6 (six) hours as needed for cough. (Patient not taking: Reported on 04/08/2015) 200 mL 0  . ibuprofen (ADVIL,MOTRIN) 200 MG tablet Take 200-800 mg by mouth once as needed for headache or mild pain.    . Iron-FA-B Cmp-C-Biot-Probiotic (FUSION PLUS) CAPS Take 1 capsule by mouth  daily.     Marland Kitchen lidocaine-prilocaine (EMLA) cream Apply a quarter size amount to port site 1 hour prior to chemo. Do not rub in. Cover with plastic wrap. 30 g 3  . megestrol (MEGACE) 40 MG/ML suspension Take 400 mg by mouth daily.     . metFORMIN (GLUCOPHAGE) 500 MG tablet Take 1 tablet (500 mg total) by mouth 2 (two) times daily with a meal. 60 tablet 2  . metoCLOPramide (REGLAN) 10 MG tablet Take 1 tablet (10 mg total) by mouth 4 (four) times daily. (Patient not taking: Reported on 04/22/2015) 120 tablet 3  . nicotine (NICOTINE STEP 1) 21 mg/24hr patch Place 1 patch (21 mg total) onto the skin daily. 28 patch 0  . nicotine (NICOTINE STEP 2) 14 mg/24hr patch Place 1 patch (14 mg total) onto the skin daily. 28 patch 0  . nicotine (NICOTINE STEP 3) 7 mg/24hr patch Place 1 patch (7 mg total) onto the skin daily. 28 patch 0  . Nivolumab (OPDIVO IV) Inject into the vein every 14 (fourteen) days.    Marland Kitchen omeprazole (PRILOSEC) 20 MG capsule Take 1 capsule (20 mg total) by mouth 2 (two) times daily before a meal. 60 capsule 11  . ondansetron (ZOFRAN) 4 MG tablet Take 1 tablet (4 mg total) by mouth every 8 (eight) hours. (Patient not taking: Reported on 04/22/2015) 60 tablet 1  . ondansetron (ZOFRAN) 8 MG tablet TAKE ONE TABLET BY MOUTH EVERY 12 HOURS AS NEEDED FOR NAUSEA 60 tablet 0  . potassium chloride SA (K-DUR,KLOR-CON) 20 MEQ tablet Take 1 tablet (20 mEq total) by mouth daily. 30 tablet 0  . predniSONE (DELTASONE) 10 MG tablet Prednisone 86m (8 tabs) by mouth daily x 3 days, then 787m(7 tabs) x 3 days, 607m6 tabs) x 3 days, 60m54m tabs) x 3 days, 40mg24mtabs) x 3 days, 30mg 25mabs) x 3 days, 20mg (42mbs) x 3 days, 10mg (182m) x 3 days, 5mg (1/263mb) x 3 days. Start @ 1st sign of diarrhea. (Patient not taking: Reported on 03/11/2015) 120 tablet 0  . prochlorperazine (COMPAZINE) 10 MG tablet Starting the day after chemo, take 1 tab four times a day x 2 days. Then may take 1 tab four times a day IF needed for  nausea/vomiting. (Patient not taking: Reported on 04/08/2015) 60 tablet 2  . senna (SENOKOT) 8.6 MG tablet Take 2 tablets by mouth at bedtime.     . simvastatin (ZOCOR) 40 MG tablet Take 40 mg by mouth every morning.     . triamcinolone (KENALOG) 0.025 % cream Apply 1 application topically 2 (two) times daily.     Current Facility-Administered Medications on File Prior to Visit  Medication Dose Route Frequency Provider Last Rate Last Dose  . 0.9 %  sodium chloride infusion   Intravenous Once Shannon KPatrici Ranks . heparin lock flush 100 unit/mL  500 Units Intracatheter Once PRN Shannon KPatrici Ranks . nivolumab (OPDIVO) 110 mg in sodium chloride 0.9 % 100 mL chemo infusion  3 mg/kg (Treatment Plan Actual)  Intravenous Once Patrici Ranks, MD      . sodium chloride 0.9 % injection 10 mL  10 mL Intracatheter PRN Patrici Ranks, MD        Past Surgical History  Procedure Laterality Date  . Appendectomy    . Lung biopsy Right 07/2014    CT guided  . Portacath placement Left 09/01/2014  . Esophagogastroduodenoscopy N/A 12/09/2014    EQA:STMHDQ esophageal stricture/mild-to-noderate erosive gastritis. negative H.pylori  . Savory dilation N/A 12/09/2014    Procedure: SAVORY DILATION;  Surgeon: [name], MD;  Location: [facility];  Service: Endoscopy;  Laterality: N/A;  [name] dilation N/A 12/09/2014    Procedure: [name] DILATION;  Surgeon: [name], MD;  Location: [facility];  Service: Endoscopy;  Laterality: N/A;  . Flexible sigmoidoscopy  2011    Dr. [name]: hyperplastic polyp    Denies any headaches, dizziness, double vision, fevers, chills, night sweats, nausea, vomiting, diarrhea, constipation, chest pain, heart palpitations, shortness of breath, blood in stool, black tarry stool, urinary pain, urinary burning, urinary frequency, hematuria.   PHYSICAL EXAMINATION  ECOG PERFORMANCE STATUS: 1 - Symptomatic but completely ambulatory  Filed Vitals:    06/03/15 1018  BP: 119/79  Pulse: 95  Temp: 98.4 F (36.9 C)  Resp: 20    GENERAL:alert, no distress, well developed, comfortable, cooperative, smiling and accompanied by daughter. SKIN: skin color, texture, turgor are normal, no rashes or significant lesions HEAD: Normocephalic, No masses, lesions, tenderness or abnormalities EYES: normal, PERRLA, EOMI, Conjunctiva are pink and non-injected EARS: External ears normal OROPHARYNX:lips, buccal mucosa, and tongue normal and mucous membranes are moist  NECK: supple, no adenopathy, thyroid normal size, non-tender, without nodularity, trachea midline LYMPH:  no palpable lymphadenopathy, no hepatosplenomegaly BREAST:not examined LUNGS: clear to auscultation and percussion HEART: regular rate & rhythm without murmur or gallop ABDOMEN:abdomen soft, non-tender and normal bowel sounds BACK: Back symmetric, no curvature. EXTREMITIES:less then 2 second capillary refill, no joint deformities, effusion, or inflammation, no skin discoloration  NEURO: alert & oriented x 3 with fluent speech, no focal motor/sensory deficits, gait normal   LABORATORY DATA: CBC    Component Value Date/Time   WBC 8.8 06/03/2015 1000   RBC 3.86* 06/03/2015 1000   RBC 2.45* 01/15/2015 0901   HGB 11.8* 06/03/2015 1000   HCT 37.0 06/03/2015 1000   PLT 229 06/03/2015 1000   MCV 95.9 06/03/2015 1000   MCH 30.6 06/03/2015 1000   MCHC 31.9 06/03/2015 1000   RDW 16.4* 06/03/2015 1000   LYMPHSABS 1.9 06/03/2015 1000   MONOABS 0.9 06/03/2015 1000   EOSABS 0.5 06/03/2015 1000   BASOSABS 0.0 06/03/2015 1000      Chemistry      Component Value Date/Time   NA 133* 06/03/2015 1000   K 4.1 06/03/2015 1000   CL 99* 06/03/2015 1000   CO2 27 06/03/2015 1000   BUN 31* 06/03/2015 1000   CREATININE 1.71* 06/03/2015 1000      Component Value Date/Time   CALCIUM 10.5* 06/03/2015 1000   ALKPHOS 68 06/03/2015 1000   AST 18 06/03/2015 1000   ALT 9* 06/03/2015 1000    BILITOT 0.3 06/03/2015 1000        ASSESSMENT AND PLAN:  Cancer of upper lobe of right lung Stage IV right adenocarcinoma pancoast tumor of the lung (EGFR and ALK negative) complicated by right brachial plexus neuralgia.  S/P XRT.  Currently on Nivolumab salvage therapy with Zometa for Hypercalcemia.  Recent CT  imaging in June 2016 demonstrates a response to therapy thus far.  Due for treatment today if treatment parameters are met today.  Treatment parameters are met for treatment today.  Her renal function has changed and therefore, we will give 1 L of NS today.  Additionally, her hypercalcemia persists despite Zometa 2 weeks ago.  Her correct calcium is 10.7.  We will give 1 L of NS, recheck Ca++.  If still elevated, I will order Zometa today.  Additionally, I ordered a PTH and PTH-RP today.  She is not taking calcium supplements, but of note, she is HCTZ which can increase plasma levels of HCTZ up to 11.5 mg/dL due to thiazide-induced calcium urinary retention.  She is presently asymptomatic from hypercalcemia.   Pre-chemo labs as ordered.  Return in 4 weeks for follow-up with continuation of pre-chemo labs and treatment every 2 weeks.    THERAPY PLAN:  Continue treatment as planned while being vigilant of progressive disease.  All questions were answered. The patient knows to call the clinic with any problems, questions or concerns. We can certainly see the patient much sooner if necessary.  Patient and plan discussed with Dr. Ancil Linsey and she is in agreement with the aforementioned.   This note is electronically signed by: Robynn Pane 06/03/2015 11:20 AM

## 2015-06-04 LAB — PTH, INTACT AND CALCIUM
Calcium, Total (PTH): 9.7 mg/dL (ref 8.7–10.2)
PTH: 26 pg/mL (ref 15–65)

## 2015-06-09 ENCOUNTER — Other Ambulatory Visit (HOSPITAL_COMMUNITY): Payer: Self-pay | Admitting: Oncology

## 2015-06-09 DIAGNOSIS — C3411 Malignant neoplasm of upper lobe, right bronchus or lung: Secondary | ICD-10-CM

## 2015-06-09 DIAGNOSIS — R112 Nausea with vomiting, unspecified: Secondary | ICD-10-CM

## 2015-06-09 LAB — PTH-RELATED PEPTIDE: PTH-related peptide: <0.74 pmol/L

## 2015-06-09 MED ORDER — ONDANSETRON HCL 8 MG PO TABS: 8.0000 mg | ORAL_TABLET | Freq: Three times a day (TID) | ORAL | Status: DC | PRN

## 2015-06-17 ENCOUNTER — Encounter (HOSPITAL_BASED_OUTPATIENT_CLINIC_OR_DEPARTMENT_OTHER): Payer: Medicaid Other

## 2015-06-17 ENCOUNTER — Encounter (HOSPITAL_COMMUNITY): Payer: Self-pay

## 2015-06-17 VITALS — BP 103/67 | HR 67 | Temp 98.0°F | Resp 18 | Wt 82.2 lb

## 2015-06-17 DIAGNOSIS — C7972 Secondary malignant neoplasm of left adrenal gland: Secondary | ICD-10-CM | POA: Diagnosis not present

## 2015-06-17 DIAGNOSIS — C7971 Secondary malignant neoplasm of right adrenal gland: Secondary | ICD-10-CM | POA: Diagnosis not present

## 2015-06-17 DIAGNOSIS — Z5111 Encounter for antineoplastic chemotherapy: Secondary | ICD-10-CM | POA: Diagnosis not present

## 2015-06-17 DIAGNOSIS — E278 Other specified disorders of adrenal gland: Secondary | ICD-10-CM

## 2015-06-17 DIAGNOSIS — C7951 Secondary malignant neoplasm of bone: Secondary | ICD-10-CM

## 2015-06-17 DIAGNOSIS — C3411 Malignant neoplasm of upper lobe, right bronchus or lung: Secondary | ICD-10-CM | POA: Diagnosis not present

## 2015-06-17 LAB — CBC WITH DIFFERENTIAL/PLATELET
BASOS ABS: 0 10*3/uL (ref 0.0–0.1)
BASOS PCT: 0 % (ref 0–1)
EOS ABS: 0.3 10*3/uL (ref 0.0–0.7)
Eosinophils Relative: 5 % (ref 0–5)
HCT: 34.4 % — ABNORMAL LOW (ref 36.0–46.0)
HEMOGLOBIN: 11.1 g/dL — AB (ref 12.0–15.0)
LYMPHS ABS: 1.5 10*3/uL (ref 0.7–4.0)
Lymphocytes Relative: 23 % (ref 12–46)
MCH: 30.4 pg (ref 26.0–34.0)
MCHC: 32.3 g/dL (ref 30.0–36.0)
MCV: 94.2 fL (ref 78.0–100.0)
MONOS PCT: 11 % (ref 3–12)
Monocytes Absolute: 0.7 10*3/uL (ref 0.1–1.0)
NEUTROS ABS: 3.8 10*3/uL (ref 1.7–7.7)
NEUTROS PCT: 61 % (ref 43–77)
PLATELETS: 197 10*3/uL (ref 150–400)
RBC: 3.65 MIL/uL — ABNORMAL LOW (ref 3.87–5.11)
RDW: 16.1 % — ABNORMAL HIGH (ref 11.5–15.5)
WBC: 6.2 10*3/uL (ref 4.0–10.5)

## 2015-06-17 LAB — COMPREHENSIVE METABOLIC PANEL
ALK PHOS: 61 U/L (ref 38–126)
ALT: 12 U/L — ABNORMAL LOW (ref 14–54)
ANION GAP: 10 (ref 5–15)
AST: 18 U/L (ref 15–41)
Albumin: 3.8 g/dL (ref 3.5–5.0)
BUN: 24 mg/dL — AB (ref 6–20)
CALCIUM: 10.2 mg/dL (ref 8.9–10.3)
CO2: 25 mmol/L (ref 22–32)
CREATININE: 1.74 mg/dL — AB (ref 0.44–1.00)
Chloride: 102 mmol/L (ref 101–111)
GFR calc Af Amer: 36 mL/min — ABNORMAL LOW (ref >60)
GFR calc non Af Amer: 31 mL/min — ABNORMAL LOW (ref >60)
Glucose, Bld: 86 mg/dL (ref 65–99)
POTASSIUM: 3.5 mmol/L (ref 3.5–5.1)
Sodium: 137 mmol/L (ref 135–145)
TOTAL PROTEIN: 7.7 g/dL (ref 6.5–8.1)
Total Bilirubin: 0.2 mg/dL — ABNORMAL LOW (ref 0.3–1.2)

## 2015-06-17 MED ORDER — ZOLEDRONIC ACID 4 MG/5ML IV CONC: 3.5000 mg | Freq: Once | INTRAVENOUS | Status: DC

## 2015-06-17 MED ORDER — DARBEPOETIN ALFA 500 MCG/ML IJ SOSY: 500.0000 ug | PREFILLED_SYRINGE | Freq: Once | INTRAMUSCULAR | Status: DC

## 2015-06-17 MED ORDER — HEPARIN SOD (PORK) LOCK FLUSH 100 UNIT/ML IV SOLN
INTRAVENOUS | Status: AC
  Filled 2015-06-17: qty 5

## 2015-06-17 MED ORDER — SODIUM CHLORIDE 0.9 % IV SOLN
3.0000 mg/kg | Freq: Once | INTRAVENOUS | Status: AC
  Administered 2015-06-17: 110 mg via INTRAVENOUS
  Filled 2015-06-17: qty 10

## 2015-06-17 MED ORDER — SODIUM CHLORIDE 0.9 % IV SOLN
Freq: Once | INTRAVENOUS | Status: AC
  Administered 2015-06-17: 11:00:00 via INTRAVENOUS

## 2015-06-17 MED ORDER — HEPARIN SOD (PORK) LOCK FLUSH 100 UNIT/ML IV SOLN
500.0000 [IU] | Freq: Once | INTRAVENOUS | Status: AC | PRN
  Administered 2015-06-17: 500 [IU]

## 2015-06-17 MED ORDER — SODIUM CHLORIDE 0.9 % IJ SOLN
10.0000 mL | INTRAMUSCULAR | Status: DC | PRN
  Administered 2015-06-17: 10 mL
  Filled 2015-06-17: qty 10

## 2015-06-17 NOTE — Progress Notes (Signed)
Tolerated infusion w/o adverse reaction.  A&Ox4; VSS; in no distress; discharged ambulatory in c/o friend for transport home.

## 2015-06-17 NOTE — Patient Instructions (Signed)
[facility] Discharge Instructions for Patients Receiving Chemotherapy  Today you received the following chemotherapy agents:  Opdivo If you develop nausea and vomiting, or diarrhea that is not controlled by your medication, call the clinic. Return as scheduled for infusion and office visit.  The clinic phone number is [phone]. Office hours are Monday-Friday 8:30am-5:00pm.  BELOW ARE SYMPTOMS THAT SHOULD BE REPORTED IMMEDIATELY:  *FEVER GREATER THAN 101.0 F  *CHILLS WITH OR WITHOUT FEVER  NAUSEA AND VOMITING THAT IS NOT CONTROLLED WITH YOUR NAUSEA MEDICATION  *UNUSUAL SHORTNESS OF BREATH  *UNUSUAL BRUISING OR BLEEDING  TENDERNESS IN MOUTH AND THROAT WITH OR WITHOUT PRESENCE OF ULCERS  *URINARY PROBLEMS  *BOWEL PROBLEMS  UNUSUAL RASH Items with * indicate a potential emergency and should be followed up as soon as possible. If you have an emergency after office hours please contact your primary care physician or go to the nearest emergency department.  Please call the clinic during office hours if you have any questions or concerns.   You may also contact the Patient Navigator at [phone] should you have any questions or need assistance in obtaining follow up care. _____________________________________________________________________ Have you asked about our STAR program?    STAR stands for Survivorship Training and Rehabilitation, and this is a nationally recognized cancer care program that focuses on survivorship and rehabilitation.  Cancer and cancer treatments may cause problems, such as, pain, making you feel tired and keeping you from doing the things that you need or want to do. Cancer rehabilitation can help. Our goal is to reduce these troubling effects and help you have the best quality of life possible.  You may receive a survey from a nurse that asks questions about your current state of health.  Based on the survey results, all eligible  patients will be referred to the [facility] program for an evaluation so we can better serve you! A frequently asked questions sheet is available upon request.

## 2015-06-24 ENCOUNTER — Ambulatory Visit (HOSPITAL_COMMUNITY): Payer: Medicaid Other

## 2015-06-24 ENCOUNTER — Other Ambulatory Visit (HOSPITAL_COMMUNITY): Payer: Medicaid Other

## 2015-07-01 ENCOUNTER — Encounter (HOSPITAL_COMMUNITY): Payer: Medicaid Other | Attending: Hematology and Oncology | Admitting: Oncology

## 2015-07-01 ENCOUNTER — Encounter (HOSPITAL_BASED_OUTPATIENT_CLINIC_OR_DEPARTMENT_OTHER): Payer: Medicaid Other

## 2015-07-01 ENCOUNTER — Other Ambulatory Visit (HOSPITAL_COMMUNITY): Payer: Self-pay | Admitting: Hematology & Oncology

## 2015-07-01 ENCOUNTER — Encounter (HOSPITAL_COMMUNITY): Payer: Self-pay | Admitting: Oncology

## 2015-07-01 VITALS — BP 124/73 | HR 87 | Temp 98.4°F | Resp 18 | Wt 86.0 lb

## 2015-07-01 VITALS — BP 105/69 | HR 76 | Temp 98.1°F | Resp 16

## 2015-07-01 DIAGNOSIS — C7972 Secondary malignant neoplasm of left adrenal gland: Secondary | ICD-10-CM | POA: Insufficient documentation

## 2015-07-01 DIAGNOSIS — E785 Hyperlipidemia, unspecified: Secondary | ICD-10-CM | POA: Diagnosis not present

## 2015-07-01 DIAGNOSIS — I10 Essential (primary) hypertension: Secondary | ICD-10-CM | POA: Diagnosis not present

## 2015-07-01 DIAGNOSIS — C7951 Secondary malignant neoplasm of bone: Secondary | ICD-10-CM | POA: Diagnosis not present

## 2015-07-01 DIAGNOSIS — C772 Secondary and unspecified malignant neoplasm of intra-abdominal lymph nodes: Secondary | ICD-10-CM | POA: Insufficient documentation

## 2015-07-01 DIAGNOSIS — C7989 Secondary malignant neoplasm of other specified sites: Secondary | ICD-10-CM | POA: Insufficient documentation

## 2015-07-01 DIAGNOSIS — C781 Secondary malignant neoplasm of mediastinum: Secondary | ICD-10-CM | POA: Diagnosis not present

## 2015-07-01 DIAGNOSIS — Z79899 Other long term (current) drug therapy: Secondary | ICD-10-CM | POA: Diagnosis not present

## 2015-07-01 DIAGNOSIS — C3411 Malignant neoplasm of upper lobe, right bronchus or lung: Secondary | ICD-10-CM | POA: Insufficient documentation

## 2015-07-01 DIAGNOSIS — C7971 Secondary malignant neoplasm of right adrenal gland: Secondary | ICD-10-CM | POA: Insufficient documentation

## 2015-07-01 DIAGNOSIS — Z9089 Acquired absence of other organs: Secondary | ICD-10-CM | POA: Diagnosis not present

## 2015-07-01 DIAGNOSIS — F17210 Nicotine dependence, cigarettes, uncomplicated: Secondary | ICD-10-CM | POA: Insufficient documentation

## 2015-07-01 DIAGNOSIS — J449 Chronic obstructive pulmonary disease, unspecified: Secondary | ICD-10-CM | POA: Diagnosis not present

## 2015-07-01 DIAGNOSIS — K219 Gastro-esophageal reflux disease without esophagitis: Secondary | ICD-10-CM | POA: Insufficient documentation

## 2015-07-01 DIAGNOSIS — Z5112 Encounter for antineoplastic immunotherapy: Secondary | ICD-10-CM

## 2015-07-01 DIAGNOSIS — E278 Other specified disorders of adrenal gland: Secondary | ICD-10-CM

## 2015-07-01 LAB — COMPREHENSIVE METABOLIC PANEL
ALBUMIN: 3.8 g/dL (ref 3.5–5.0)
ALK PHOS: 70 U/L (ref 38–126)
ALT: 13 U/L — ABNORMAL LOW (ref 14–54)
AST: 22 U/L (ref 15–41)
Anion gap: 11 (ref 5–15)
BUN: 26 mg/dL — AB (ref 6–20)
CALCIUM: 10.6 mg/dL — AB (ref 8.9–10.3)
CO2: 26 mmol/L (ref 22–32)
Chloride: 99 mmol/L — ABNORMAL LOW (ref 101–111)
Creatinine, Ser: 1.73 mg/dL — ABNORMAL HIGH (ref 0.44–1.00)
GFR calc non Af Amer: 31 mL/min — ABNORMAL LOW (ref >60)
GFR, EST AFRICAN AMERICAN: 36 mL/min — AB (ref >60)
GLUCOSE: 94 mg/dL (ref 65–99)
POTASSIUM: 3.6 mmol/L (ref 3.5–5.1)
Sodium: 136 mmol/L (ref 135–145)
TOTAL PROTEIN: 7.5 g/dL (ref 6.5–8.1)
Total Bilirubin: 0.2 mg/dL — ABNORMAL LOW (ref 0.3–1.2)

## 2015-07-01 LAB — CBC WITH DIFFERENTIAL/PLATELET
BASOS ABS: 0 10*3/uL (ref 0.0–0.1)
BASOS PCT: 0 % (ref 0–1)
EOS ABS: 0.2 10*3/uL (ref 0.0–0.7)
Eosinophils Relative: 3 % (ref 0–5)
HCT: 35 % — ABNORMAL LOW (ref 36.0–46.0)
Hemoglobin: 11.2 g/dL — ABNORMAL LOW (ref 12.0–15.0)
Lymphocytes Relative: 25 % (ref 12–46)
Lymphs Abs: 1.9 10*3/uL (ref 0.7–4.0)
MCH: 30.5 pg (ref 26.0–34.0)
MCHC: 32 g/dL (ref 30.0–36.0)
MCV: 95.4 fL (ref 78.0–100.0)
MONOS PCT: 11 % (ref 3–12)
Monocytes Absolute: 0.8 10*3/uL (ref 0.1–1.0)
NEUTROS ABS: 4.6 10*3/uL (ref 1.7–7.7)
Neutrophils Relative %: 61 % (ref 43–77)
PLATELETS: 212 10*3/uL (ref 150–400)
RBC: 3.67 MIL/uL — AB (ref 3.87–5.11)
RDW: 16 % — ABNORMAL HIGH (ref 11.5–15.5)
WBC: 7.6 10*3/uL (ref 4.0–10.5)

## 2015-07-01 MED ORDER — HEPARIN SOD (PORK) LOCK FLUSH 100 UNIT/ML IV SOLN
500.0000 [IU] | Freq: Once | INTRAVENOUS | Status: AC | PRN
  Administered 2015-07-01: 500 [IU]

## 2015-07-01 MED ORDER — SODIUM CHLORIDE 0.9 % IJ SOLN
10.0000 mL | INTRAMUSCULAR | Status: DC | PRN
  Administered 2015-07-01: 10 mL
  Filled 2015-07-01: qty 10

## 2015-07-01 MED ORDER — HEPARIN SOD (PORK) LOCK FLUSH 100 UNIT/ML IV SOLN
INTRAVENOUS | Status: AC
  Filled 2015-07-01: qty 5

## 2015-07-01 MED ORDER — SODIUM CHLORIDE 0.9 % IV SOLN
Freq: Once | INTRAVENOUS | Status: AC
  Administered 2015-07-01: 11:00:00 via INTRAVENOUS

## 2015-07-01 MED ORDER — SODIUM CHLORIDE 0.9 % IV SOLN
3.0000 mg/kg | Freq: Once | INTRAVENOUS | Status: AC
  Administered 2015-07-01: 110 mg via INTRAVENOUS
  Filled 2015-07-01: qty 10

## 2015-07-01 NOTE — Assessment & Plan Note (Addendum)
Stage IV right adenocarcinoma pancoast tumor of the lung (EGFR and ALK negative) complicated by right brachial plexus neuralgia.  S/P XRT.  Currently on Nivolumab salvage therapy with Zometa for Hypercalcemia.  Recent CT imaging in June 2016 demonstrates a response to therapy thus far.  Due for treatment today if treatment parameters are met today.   Pre-chemo labs deleted and re-ordered to better reflect pre-chemo labs that are requested.  CT CAP with contrast in 4 weeks.  Return in 4 weeks for follow-up with continuation of pre-chemo labs and treatment every 2 weeks.

## 2015-07-01 NOTE — Progress Notes (Signed)
Tolerated chemo well. 

## 2015-07-01 NOTE — Progress Notes (Signed)
Melissa Cahill, MD  [place] [address]  Cancer of upper lobe of right lung - Plan: CT Abdomen Pelvis W Contrast, CT Chest W Contrast, CBC with Differential, Comprehensive metabolic panel, TSH  CURRENT THERAPY: Nivolumab/zometa  INTERVAL HISTORY: Melissa Sullivan 59 y.o. 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    Cancer of upper lobe of right lung   07/28/2014 Imaging CT chest: Large R apical mass consistent with malignancy. This is destroying the R 2nd rib with extension into adjacent soft tissue. R hilar adenopathy with R 5cm adrenal metastatic lesion.   08/01/2014 Initial Biopsy Lung, needle/core biopsy(ies), right upper lobe - POORLY DIFFERENTIATED ADENOCARCINOMA, SEE COMMENT.   08/08/2014 PET scan Large hypermetabolic R apical mass with evidence of direct chest wall and mediastinal invasion, right retrocrural lymphadenopathy, extensive retroperitoneal lymphadenopathy, and metastatic lesions to the adrenal glands    09/02/2014 - 11/04/2014 Chemotherapy Cisplatin/Pemetrexed/Avastin every 21 days x 4 cycles   10/07/2014 - 10/27/2014 Radiation Therapy Right lung apex for control of brachioplexopathy.   12/24/2014 - 02/25/2015 Chemotherapy Alimta/Avastin every 21 days.   02/20/2015 Imaging Increase in size of right adrenal metastasis and subjacent confluent retrocaval lymphadenopathy   02/25/2015 -  Chemotherapy Nivolumab, zometa   05/04/2015 Imaging CT CAP- Stable to slight decrease in the posterior right apical lesion. Stable appearance of posterior right upper rib an upper thoracic bony lesions. Slight improvement in right upper lobe tree-in-bud opacity. No new or progressive findings in...   I personally reviewed and went over laboratory results with the patient.  The results are noted within this dictation.  CBC is reported and meets treatment parameters,  but CMET is pending at the time of this dictation.  "I feel good and I've gained some weight."  She reports that she feels good without any complaints today.  She denies any cough, hemoptysis, SOB, etc.  Her bowels are working without diarrhea or constipation.  She denies any blood in her stool.   Her weight is up 4 lbs since July 2016.  She is eating well.  Her appetite is improved.  She wishes to continue to gain weight.   Past Medical History  Diagnosis Date  . Hypertension   . Reflux   . Hyperlipidemia   . Lung mass 07/28/2014  . Adrenal mass, right 07/28/2014  . Diabetes mellitus without complication   . Cancer     lung  right    has Adrenal mass, right; Cancer of upper lobe of right lung; ARF (acute renal failure); Abnormal finding on imaging; Oropharyngeal dysphagia; Nausea and vomiting; Hypercalcemia; Anemia in neoplastic disease; Dysphagia; and Encounter for screening colonoscopy on her problem list.     has No Known Allergies.  Current Outpatient Prescriptions on File Prior to Visit  Medication Sig Dispense Refill  . Alum & Mag Hydroxide-Simeth (MAGIC MOUTHWASH W/LIDOCAINE) SOLN Take 5 mLs by mouth 4 (four) times daily as needed for mouth pain. 400 mL 4  . Fluticasone-Salmeterol (ADVAIR DISKUS) 500-50 MCG/DOSE AEPB Two puffs at the same time daily. 60 each 6  . folic acid (FOLVITE) 1 MG tablet Take 1 mg by mouth every morning.    . hydrochlorothiazide (MICROZIDE) 12.5 MG capsule Take 12.5 mg by mouth every morning.     Marland Kitchen HYDROcodone-homatropine (HYCODAN) 5-1.5 MG/5ML syrup Take 5 mLs by mouth every 6 (six) hours as  needed for cough. 200 mL 0  . ibuprofen (ADVIL,MOTRIN) 200 MG tablet Take 200-800 mg by mouth once as needed for headache or mild pain.    . Iron-FA-B Cmp-C-Biot-Probiotic (FUSION PLUS) CAPS Take 1 capsule by mouth daily.     Marland Kitchen lidocaine-prilocaine (EMLA) cream Apply a quarter size amount to port site 1 hour prior to chemo. Do not rub in. Cover with plastic wrap. 30  g 3  . megestrol (MEGACE) 40 MG/ML suspension Take 400 mg by mouth daily.     . metFORMIN (GLUCOPHAGE) 500 MG tablet Take 1 tablet (500 mg total) by mouth 2 (two) times daily with a meal. 60 tablet 2  . metoCLOPramide (REGLAN) 10 MG tablet Take 1 tablet (10 mg total) by mouth 4 (four) times daily. 120 tablet 3  . nicotine (NICOTINE STEP 1) 21 mg/24hr patch Place 1 patch (21 mg total) onto the skin daily. 28 patch 0  . Nivolumab (OPDIVO IV) Inject into the vein every 14 (fourteen) days.    Marland Kitchen omeprazole (PRILOSEC) 20 MG capsule Take 1 capsule (20 mg total) by mouth 2 (two) times daily before a meal. 60 capsule 11  . ondansetron (ZOFRAN) 8 MG tablet Take 1 tablet (8 mg total) by mouth every 8 (eight) hours as needed for nausea or vomiting. 60 tablet 1  . Oxycodone HCl 10 MG TABS Take 1-2 tablets (10-20 mg total) by mouth every 6 (six) hours as needed (pain.). 100 tablet 0  . potassium chloride SA (K-DUR,KLOR-CON) 20 MEQ tablet Take 1 tablet (20 mEq total) by mouth daily. 30 tablet 0  . senna (SENOKOT) 8.6 MG tablet Take 2 tablets by mouth at bedtime.     . simvastatin (ZOCOR) 40 MG tablet Take 40 mg by mouth every morning.     . triamcinolone (KENALOG) 0.025 % cream Apply 1 application topically 2 (two) times daily.    . predniSONE (DELTASONE) 10 MG tablet Prednisone 24m (8 tabs) by mouth daily x 3 days, then 753m(7 tabs) x 3 days, 6057m6 tabs) x 3 days, 54m52m tabs) x 3 days, 40mg38mtabs) x 3 days, 30mg 2mabs) x 3 days, 20mg (78mbs) x 3 days, 10mg (130m) x 3 days, 5mg (1/272mb) x 3 days. Start @ 1st sign of diarrhea. (Patient not taking: Reported on 03/11/2015) 120 tablet 0  . prochlorperazine (COMPAZINE) 10 MG tablet Starting the day after chemo, take 1 tab four times a day x 2 days. Then may take 1 tab four times a day IF needed for nausea/vomiting. (Patient not taking: Reported on 04/08/2015) 60 tablet 2   No current facility-administered medications on file prior to visit.    Past  Surgical History  Procedure Laterality Date  . Appendectomy    . Lung biopsy Right 07/2014    CT guided  . Portacath placement Left 09/01/2014  . Esophagogastroduodenoscopy N/A 12/09/2014    SLF:distaFVC:BSWHQPal stricture/mild-to-noderate erosive gastritis. negative H.pylori  . Savory dilation N/A 12/09/2014    Procedure: SAVORY DILATION;  Surgeon: Sandi L FDanie Bindercation: [facility] SUITE;  Service: Endoscopy;  Laterality: N/A;  . MaloneyVenia Minks N/A 12/09/2014    Procedure: MALONEY DVenia Minks;  Surgeon: Sandi L FDanie Bindercation: [facility] SUITE;  Service: Endoscopy;  Laterality: N/A;  . Flexible sigmoidoscopy  2011    Dr. Fields: h[place] Alarastic polyp    Denies any headaches, dizziness, double vision, fevers, chills, night sweats, nausea, vomiting, diarrhea, constipation, chest pain, heart palpitations,  shortness of breath, blood in stool, black tarry stool, urinary pain, urinary burning, urinary frequency, hematuria.   PHYSICAL EXAMINATION  ECOG PERFORMANCE STATUS: 1 - Symptomatic but completely ambulatory  Filed Vitals:   07/01/15 0900  BP: 124/73  Pulse: 87  Temp: 98.4 F (36.9 C)  Resp: 18    GENERAL:alert, no distress, well developed, comfortable, cooperative, smiling and accompanied by daughter. SKIN: skin color, texture, turgor are normal, no rashes or significant lesions HEAD: Normocephalic, No masses, lesions, tenderness or abnormalities EYES: normal, PERRLA, EOMI, Conjunctiva are pink and non-injected EARS: External ears normal OROPHARYNX:lips, buccal mucosa, and tongue normal and mucous membranes are moist  NECK: supple, no adenopathy, thyroid normal size, non-tender, without nodularity, trachea midline LYMPH:  no palpable lymphadenopathy, no hepatosplenomegaly BREAST:not examined LUNGS: clear to auscultation and percussion HEART: regular rate & rhythm without murmur or gallop ABDOMEN:abdomen soft, non-tender and normal bowel sounds BACK: Back symmetric, no  curvature. EXTREMITIES:less then 2 second capillary refill, no joint deformities, effusion, or inflammation, no skin discoloration  NEURO: alert & oriented x 3 with fluent speech, no focal motor/sensory deficits, gait normal   LABORATORY DATA: CBC    Component Value Date/Time   WBC 7.6 07/01/2015 0922   RBC 3.67* 07/01/2015 0922   RBC 2.45* 01/15/2015 0901   HGB 11.2* 07/01/2015 0922   HCT 35.0* 07/01/2015 0922   PLT 212 07/01/2015 0922   MCV 95.4 07/01/2015 0922   MCH 30.5 07/01/2015 0922   MCHC 32.0 07/01/2015 0922   RDW 16.0* 07/01/2015 0922   LYMPHSABS 1.9 07/01/2015 0922   MONOABS 0.8 07/01/2015 0922   EOSABS 0.2 07/01/2015 0922   BASOSABS 0.0 07/01/2015 0922      Chemistry      Component Value Date/Time   NA 137 06/17/2015 1030   K 3.5 06/17/2015 1030   CL 102 06/17/2015 1030   CO2 25 06/17/2015 1030   BUN 24* 06/17/2015 1030   CREATININE 1.74* 06/17/2015 1030      Component Value Date/Time   CALCIUM 10.2 06/17/2015 1030   CALCIUM 9.7 06/03/2015 1000   ALKPHOS 61 06/17/2015 1030   AST 18 06/17/2015 1030   ALT 12* 06/17/2015 1030   BILITOT 0.2* 06/17/2015 1030        ASSESSMENT AND PLAN:  Cancer of upper lobe of right lung Stage IV right adenocarcinoma pancoast tumor of the lung (EGFR and ALK negative) complicated by right brachial plexus neuralgia.  S/P XRT.  Currently on Nivolumab salvage therapy with Zometa for Hypercalcemia.  Recent CT imaging in June 2016 demonstrates a response to therapy thus far.  Due for treatment today if treatment parameters are met today.   Pre-chemo labs deleted and re-ordered to better reflect pre-chemo labs that are requested.  CT CAP with contrast in 4 weeks.  Return in 4 weeks for follow-up with continuation of pre-chemo labs and treatment every 2 weeks.    THERAPY PLAN:  Continue treatment as planned while being vigilant of progressive disease.  All questions were answered. The patient knows to call the clinic  with any problems, questions or concerns. We can certainly see the patient much sooner if necessary.  Patient and plan discussed with Dr. Ancil Linsey and she is in agreement with the aforementioned.   This note is electronically signed by: Robynn Pane 07/01/2015 9:47 AM

## 2015-07-01 NOTE — Patient Instructions (Addendum)
[place] at [facility] Discharge Instructions  RECOMMENDATIONS MADE BY THE CONSULTANT AND ANY TEST RESULTS WILL BE SENT TO YOUR REFERRING PHYSICIAN.  Exam completed by Kirby Crigler today Chemotherapy as planned today Chemotherapy and labs every 2 weeks CT scan in 4 weeks Return in 4 weeks to see the doctor and review CT scans Please call the clinic if you have any questions or concerns  Thank you for choosing [place] at [facility] to provide your oncology and hematology care.  To afford each patient quality time with our provider, please arrive at least 15 minutes before your scheduled appointment time.    You need to re-schedule your appointment should you arrive 10 or more minutes late.  We strive to give you quality time with our providers, and arriving late affects you and other patients whose appointments are after yours.  Also, if you no show three or more times for appointments you may be dismissed from the clinic at the providers discretion.     Again, thank you for choosing [facility].  Our hope is that these requests will decrease the amount of time that you wait before being seen by our physicians.       _____________________________________________________________  Should you have questions after your visit to [facility], please contact our office at [phone] between the hours of 8:30 a.m. and 4:30 p.m.  Voicemails left after 4:30 p.m. will not be returned until the following business day.  For prescription refill requests, have your pharmacy contact our office.

## 2015-07-09 ENCOUNTER — Other Ambulatory Visit (HOSPITAL_COMMUNITY): Payer: Self-pay | Admitting: Oncology

## 2015-07-09 DIAGNOSIS — C3411 Malignant neoplasm of upper lobe, right bronchus or lung: Secondary | ICD-10-CM

## 2015-07-09 MED ORDER — METFORMIN HCL 500 MG PO TABS: 500.0000 mg | ORAL_TABLET | Freq: Two times a day (BID) | ORAL | Status: DC

## 2015-07-10 ENCOUNTER — Encounter (HOSPITAL_COMMUNITY): Payer: Self-pay

## 2015-07-10 ENCOUNTER — Encounter (HOSPITAL_BASED_OUTPATIENT_CLINIC_OR_DEPARTMENT_OTHER): Payer: Medicaid Other

## 2015-07-10 VITALS — BP 91/56 | HR 73 | Temp 98.0°F | Resp 16 | Wt 85.6 lb

## 2015-07-10 DIAGNOSIS — C7971 Secondary malignant neoplasm of right adrenal gland: Secondary | ICD-10-CM

## 2015-07-10 DIAGNOSIS — C3411 Malignant neoplasm of upper lobe, right bronchus or lung: Secondary | ICD-10-CM | POA: Diagnosis not present

## 2015-07-10 DIAGNOSIS — C7951 Secondary malignant neoplasm of bone: Secondary | ICD-10-CM | POA: Diagnosis not present

## 2015-07-10 DIAGNOSIS — C7972 Secondary malignant neoplasm of left adrenal gland: Secondary | ICD-10-CM | POA: Diagnosis not present

## 2015-07-10 MED ORDER — HEPARIN SOD (PORK) LOCK FLUSH 100 UNIT/ML IV SOLN
500.0000 [IU] | Freq: Once | INTRAVENOUS | Status: AC
  Administered 2015-07-10: 500 [IU] via INTRAVENOUS
  Filled 2015-07-10: qty 5

## 2015-07-10 MED ORDER — SODIUM CHLORIDE 0.9 % IV SOLN
Freq: Once | INTRAVENOUS | Status: AC
  Administered 2015-07-10: 09:00:00 via INTRAVENOUS

## 2015-07-10 MED ORDER — SODIUM CHLORIDE 0.9 % IV SOLN
3.0000 mg | Freq: Once | INTRAVENOUS | Status: AC
  Administered 2015-07-10: 3 mg via INTRAVENOUS
  Filled 2015-07-10: qty 3.75

## 2015-07-10 MED ORDER — SODIUM CHLORIDE 0.9 % IJ SOLN
10.0000 mL | INTRAMUSCULAR | Status: DC | PRN
  Administered 2015-07-10: 10 mL via INTRAVENOUS
  Filled 2015-07-10: qty 10

## 2015-07-10 NOTE — Patient Instructions (Signed)
[place] at [facility] Discharge Instructions  RECOMMENDATIONS MADE BY THE CONSULTANT AND ANY TEST RESULTS WILL BE SENT TO YOUR REFERRING PHYSICIAN.  Zometa infusion today. Return as scheduled for Opdivo infusion and office visit.  Thank you for choosing [place] at [facility] to provide your oncology and hematology care.  To afford each patient quality time with our provider, please arrive at least 15 minutes before your scheduled appointment time.    You need to re-schedule your appointment should you arrive 10 or more minutes late.  We strive to give you quality time with our providers, and arriving late affects you and other patients whose appointments are after yours.  Also, if you no show three or more times for appointments you may be dismissed from the clinic at the providers discretion.     Again, thank you for choosing [facility].  Our hope is that these requests will decrease the amount of time that you wait before being seen by our physicians.       _____________________________________________________________  Should you have questions after your visit to [facility], please contact our office at [phone] between the hours of 8:30 a.m. and 4:30 p.m.  Voicemails left after 4:30 p.m. will not be returned until the following business day.  For prescription refill requests, have your pharmacy contact our office.

## 2015-07-15 ENCOUNTER — Encounter (HOSPITAL_BASED_OUTPATIENT_CLINIC_OR_DEPARTMENT_OTHER): Payer: Medicaid Other

## 2015-07-15 VITALS — BP 102/59 | HR 78 | Temp 97.8°F | Resp 16 | Wt 85.8 lb

## 2015-07-15 DIAGNOSIS — C7951 Secondary malignant neoplasm of bone: Secondary | ICD-10-CM | POA: Diagnosis not present

## 2015-07-15 DIAGNOSIS — Z5112 Encounter for antineoplastic immunotherapy: Secondary | ICD-10-CM | POA: Diagnosis not present

## 2015-07-15 DIAGNOSIS — C7972 Secondary malignant neoplasm of left adrenal gland: Secondary | ICD-10-CM | POA: Diagnosis not present

## 2015-07-15 DIAGNOSIS — C7971 Secondary malignant neoplasm of right adrenal gland: Secondary | ICD-10-CM

## 2015-07-15 DIAGNOSIS — C3411 Malignant neoplasm of upper lobe, right bronchus or lung: Secondary | ICD-10-CM

## 2015-07-15 DIAGNOSIS — E278 Other specified disorders of adrenal gland: Secondary | ICD-10-CM

## 2015-07-15 LAB — CBC WITH DIFFERENTIAL/PLATELET
BASOS PCT: 0 % (ref 0–1)
Basophils Absolute: 0 10*3/uL (ref 0.0–0.1)
EOS ABS: 0.3 10*3/uL (ref 0.0–0.7)
EOS PCT: 4 % (ref 0–5)
HCT: 32.8 % — ABNORMAL LOW (ref 36.0–46.0)
HEMOGLOBIN: 10.8 g/dL — AB (ref 12.0–15.0)
LYMPHS ABS: 1.9 10*3/uL (ref 0.7–4.0)
Lymphocytes Relative: 27 % (ref 12–46)
MCH: 30.9 pg (ref 26.0–34.0)
MCHC: 32.9 g/dL (ref 30.0–36.0)
MCV: 93.7 fL (ref 78.0–100.0)
Monocytes Absolute: 0.8 10*3/uL (ref 0.1–1.0)
Monocytes Relative: 11 % (ref 3–12)
NEUTROS PCT: 58 % (ref 43–77)
Neutro Abs: 4.2 10*3/uL (ref 1.7–7.7)
PLATELETS: 231 10*3/uL (ref 150–400)
RBC: 3.5 MIL/uL — AB (ref 3.87–5.11)
RDW: 16.6 % — ABNORMAL HIGH (ref 11.5–15.5)
WBC: 7.1 10*3/uL (ref 4.0–10.5)

## 2015-07-15 LAB — COMPREHENSIVE METABOLIC PANEL
ALBUMIN: 3.8 g/dL (ref 3.5–5.0)
ALT: 13 U/L — AB (ref 14–54)
ANION GAP: 6 (ref 5–15)
AST: 18 U/L (ref 15–41)
Alkaline Phosphatase: 48 U/L (ref 38–126)
BUN: 15 mg/dL (ref 6–20)
CHLORIDE: 104 mmol/L (ref 101–111)
CO2: 25 mmol/L (ref 22–32)
CREATININE: 1.52 mg/dL — AB (ref 0.44–1.00)
Calcium: 9.8 mg/dL (ref 8.9–10.3)
GFR calc non Af Amer: 37 mL/min — ABNORMAL LOW (ref >60)
GFR, EST AFRICAN AMERICAN: 43 mL/min — AB (ref >60)
GLUCOSE: 56 mg/dL — AB (ref 65–99)
Potassium: 4.1 mmol/L (ref 3.5–5.1)
SODIUM: 135 mmol/L (ref 135–145)
Total Bilirubin: 0.2 mg/dL — ABNORMAL LOW (ref 0.3–1.2)
Total Protein: 7.6 g/dL (ref 6.5–8.1)

## 2015-07-15 LAB — TSH: TSH: 1.323 u[IU]/mL (ref 0.350–4.500)

## 2015-07-15 MED ORDER — SODIUM CHLORIDE 0.9 % IV SOLN
Freq: Once | INTRAVENOUS | Status: AC
  Administered 2015-07-15: 11:00:00 via INTRAVENOUS

## 2015-07-15 MED ORDER — SODIUM CHLORIDE 0.9 % IJ SOLN
10.0000 mL | INTRAMUSCULAR | Status: DC | PRN
  Administered 2015-07-15: 10 mL
  Filled 2015-07-15: qty 10

## 2015-07-15 MED ORDER — NIVOLUMAB CHEMO INJECTION 100 MG/10ML
3.0000 mg/kg | Freq: Once | INTRAVENOUS | Status: AC
  Administered 2015-07-15: 110 mg via INTRAVENOUS
  Filled 2015-07-15: qty 11

## 2015-07-15 MED ORDER — HEPARIN SOD (PORK) LOCK FLUSH 100 UNIT/ML IV SOLN
500.0000 [IU] | Freq: Once | INTRAVENOUS | Status: AC | PRN
  Administered 2015-07-15: 500 [IU]
  Filled 2015-07-15: qty 5

## 2015-07-15 NOTE — Patient Instructions (Signed)
[facility] Discharge Instructions for Patients Receiving Chemotherapy  Today you received the following chemotherapy agents Opdivo.  If you develop nausea and vomiting that is not controlled by your nausea medication, call the clinic. If it is after clinic hours your family physician or the after hours number for the clinic or go to the Emergency Department. BELOW ARE SYMPTOMS THAT SHOULD BE REPORTED IMMEDIATELY:  *FEVER GREATER THAN 101.0 F  *CHILLS WITH OR WITHOUT FEVER  NAUSEA AND VOMITING THAT IS NOT CONTROLLED WITH YOUR NAUSEA MEDICATION  *UNUSUAL SHORTNESS OF BREATH  *UNUSUAL BRUISING OR BLEEDING  TENDERNESS IN MOUTH AND THROAT WITH OR WITHOUT PRESENCE OF ULCERS  *URINARY PROBLEMS  *BOWEL PROBLEMS  UNUSUAL RASH Items with * indicate a potential emergency and should be followed up as soon as possible.  Return as scheduled.  I have been informed and understand all the instructions given to me. I know to contact the clinic, my physician, or go to the Emergency Department if any problems should occur. I do not have any questions at this time, but understand that I may call the clinic during office hours or the Patient Navigator at [phone] should I have any questions or need assistance in obtaining follow up care.    __________________________________________  _____________  __________ Signature of Patient or Authorized Representative            Date                   Time    __________________________________________ Nurse's Signature

## 2015-07-15 NOTE — Progress Notes (Signed)
Tolerated infusion well. 

## 2015-07-20 ENCOUNTER — Other Ambulatory Visit (HOSPITAL_COMMUNITY): Payer: Self-pay | Admitting: Oncology

## 2015-07-20 DIAGNOSIS — Z720 Tobacco use: Secondary | ICD-10-CM

## 2015-07-21 ENCOUNTER — Other Ambulatory Visit (HOSPITAL_COMMUNITY): Payer: Self-pay | Admitting: Oncology

## 2015-07-21 DIAGNOSIS — Z720 Tobacco use: Secondary | ICD-10-CM

## 2015-07-21 MED ORDER — NICOTINE 14 MG/24HR TD PT24: 14.0000 mg | MEDICATED_PATCH | Freq: Every day | TRANSDERMAL | Status: DC

## 2015-07-28 ENCOUNTER — Ambulatory Visit (HOSPITAL_COMMUNITY)
Admission: RE | Admit: 2015-07-28 | Discharge: 2015-07-28 | Disposition: A | Discharge status: Home / Self Care | Payer: 59 | Source: Ambulatory Visit | Attending: Oncology | Admitting: Oncology

## 2015-07-28 ENCOUNTER — Other Ambulatory Visit (HOSPITAL_COMMUNITY): Payer: Self-pay | Admitting: Oncology

## 2015-07-28 DIAGNOSIS — Z9221 Personal history of antineoplastic chemotherapy: Secondary | ICD-10-CM | POA: Diagnosis not present

## 2015-07-28 DIAGNOSIS — C3411 Malignant neoplasm of upper lobe, right bronchus or lung: Secondary | ICD-10-CM | POA: Diagnosis not present

## 2015-07-28 DIAGNOSIS — E119 Type 2 diabetes mellitus without complications: Secondary | ICD-10-CM | POA: Diagnosis not present

## 2015-07-28 DIAGNOSIS — Z08 Encounter for follow-up examination after completed treatment for malignant neoplasm: Secondary | ICD-10-CM | POA: Diagnosis not present

## 2015-07-28 DIAGNOSIS — I10 Essential (primary) hypertension: Secondary | ICD-10-CM | POA: Diagnosis not present

## 2015-07-28 DIAGNOSIS — R933 Abnormal findings on diagnostic imaging of other parts of digestive tract: Secondary | ICD-10-CM | POA: Diagnosis not present

## 2015-07-28 DIAGNOSIS — R937 Abnormal findings on diagnostic imaging of other parts of musculoskeletal system: Secondary | ICD-10-CM | POA: Diagnosis not present

## 2015-07-28 DIAGNOSIS — C7971 Secondary malignant neoplasm of right adrenal gland: Secondary | ICD-10-CM | POA: Diagnosis not present

## 2015-07-28 DIAGNOSIS — Z923 Personal history of irradiation: Secondary | ICD-10-CM | POA: Insufficient documentation

## 2015-07-28 MED ORDER — SODIUM CHLORIDE 0.9 % IJ SOLN
INTRAMUSCULAR | Status: AC
  Filled 2015-07-28: qty 60

## 2015-07-28 MED ORDER — OXYCODONE HCL 10 MG PO TABS: 10.0000 mg | ORAL_TABLET | Freq: Four times a day (QID) | ORAL | Status: DC | PRN

## 2015-07-28 MED ORDER — IOHEXOL 300 MG/ML  SOLN
75.0000 mL | Freq: Once | INTRAMUSCULAR | Status: AC | PRN
  Administered 2015-07-28: 75 mL via INTRAVENOUS

## 2015-07-29 ENCOUNTER — Encounter (HOSPITAL_COMMUNITY): Payer: Self-pay | Admitting: Oncology

## 2015-07-29 ENCOUNTER — Encounter (HOSPITAL_BASED_OUTPATIENT_CLINIC_OR_DEPARTMENT_OTHER): Payer: 59 | Admitting: Oncology

## 2015-07-29 ENCOUNTER — Ambulatory Visit (HOSPITAL_COMMUNITY): Payer: Medicaid Other | Admitting: Hematology & Oncology

## 2015-07-29 ENCOUNTER — Encounter (HOSPITAL_BASED_OUTPATIENT_CLINIC_OR_DEPARTMENT_OTHER): Payer: 59

## 2015-07-29 ENCOUNTER — Encounter (HOSPITAL_COMMUNITY): Payer: 59 | Attending: Hematology and Oncology

## 2015-07-29 VITALS — BP 96/69 | HR 93 | Temp 98.1°F | Resp 18 | Wt 88.0 lb

## 2015-07-29 VITALS — BP 89/56 | HR 75 | Temp 98.1°F | Resp 18

## 2015-07-29 DIAGNOSIS — C3411 Malignant neoplasm of upper lobe, right bronchus or lung: Secondary | ICD-10-CM

## 2015-07-29 DIAGNOSIS — C7971 Secondary malignant neoplasm of right adrenal gland: Secondary | ICD-10-CM | POA: Diagnosis not present

## 2015-07-29 DIAGNOSIS — J449 Chronic obstructive pulmonary disease, unspecified: Secondary | ICD-10-CM | POA: Diagnosis not present

## 2015-07-29 DIAGNOSIS — C781 Secondary malignant neoplasm of mediastinum: Secondary | ICD-10-CM | POA: Insufficient documentation

## 2015-07-29 DIAGNOSIS — Z9089 Acquired absence of other organs: Secondary | ICD-10-CM | POA: Insufficient documentation

## 2015-07-29 DIAGNOSIS — K219 Gastro-esophageal reflux disease without esophagitis: Secondary | ICD-10-CM | POA: Diagnosis not present

## 2015-07-29 DIAGNOSIS — Z79899 Other long term (current) drug therapy: Secondary | ICD-10-CM | POA: Diagnosis not present

## 2015-07-29 DIAGNOSIS — C7951 Secondary malignant neoplasm of bone: Secondary | ICD-10-CM

## 2015-07-29 DIAGNOSIS — F17210 Nicotine dependence, cigarettes, uncomplicated: Secondary | ICD-10-CM | POA: Diagnosis not present

## 2015-07-29 DIAGNOSIS — C772 Secondary and unspecified malignant neoplasm of intra-abdominal lymph nodes: Secondary | ICD-10-CM | POA: Insufficient documentation

## 2015-07-29 DIAGNOSIS — Z5112 Encounter for antineoplastic immunotherapy: Secondary | ICD-10-CM | POA: Diagnosis not present

## 2015-07-29 DIAGNOSIS — E785 Hyperlipidemia, unspecified: Secondary | ICD-10-CM | POA: Diagnosis not present

## 2015-07-29 DIAGNOSIS — E278 Other specified disorders of adrenal gland: Secondary | ICD-10-CM

## 2015-07-29 DIAGNOSIS — C7989 Secondary malignant neoplasm of other specified sites: Secondary | ICD-10-CM | POA: Diagnosis not present

## 2015-07-29 DIAGNOSIS — I10 Essential (primary) hypertension: Secondary | ICD-10-CM | POA: Diagnosis not present

## 2015-07-29 DIAGNOSIS — C7972 Secondary malignant neoplasm of left adrenal gland: Secondary | ICD-10-CM | POA: Insufficient documentation

## 2015-07-29 LAB — COMPREHENSIVE METABOLIC PANEL
ALT: 10 U/L — AB (ref 14–54)
AST: 16 U/L (ref 15–41)
Albumin: 3.7 g/dL (ref 3.5–5.0)
Alkaline Phosphatase: 56 U/L (ref 38–126)
Anion gap: 6 (ref 5–15)
BUN: 19 mg/dL (ref 6–20)
CHLORIDE: 103 mmol/L (ref 101–111)
CO2: 27 mmol/L (ref 22–32)
CREATININE: 1.47 mg/dL — AB (ref 0.44–1.00)
Calcium: 10.1 mg/dL (ref 8.9–10.3)
GFR, EST AFRICAN AMERICAN: 44 mL/min — AB (ref >60)
GFR, EST NON AFRICAN AMERICAN: 38 mL/min — AB (ref >60)
Glucose, Bld: 87 mg/dL (ref 65–99)
POTASSIUM: 4 mmol/L (ref 3.5–5.1)
SODIUM: 136 mmol/L (ref 135–145)
Total Bilirubin: 0.1 mg/dL — ABNORMAL LOW (ref 0.3–1.2)
Total Protein: 7.4 g/dL (ref 6.5–8.1)

## 2015-07-29 LAB — CBC WITH DIFFERENTIAL/PLATELET
Basophils Absolute: 0 10*3/uL (ref 0.0–0.1)
Basophils Relative: 0 % (ref 0–1)
EOS ABS: 0.2 10*3/uL (ref 0.0–0.7)
Eosinophils Relative: 3 % (ref 0–5)
HCT: 32.6 % — ABNORMAL LOW (ref 36.0–46.0)
HEMOGLOBIN: 10.7 g/dL — AB (ref 12.0–15.0)
LYMPHS ABS: 1.6 10*3/uL (ref 0.7–4.0)
LYMPHS PCT: 25 % (ref 12–46)
MCH: 30.7 pg (ref 26.0–34.0)
MCHC: 32.8 g/dL (ref 30.0–36.0)
MCV: 93.4 fL (ref 78.0–100.0)
MONOS PCT: 9 % (ref 3–12)
Monocytes Absolute: 0.6 10*3/uL (ref 0.1–1.0)
NEUTROS PCT: 63 % (ref 43–77)
Neutro Abs: 4 10*3/uL (ref 1.7–7.7)
Platelets: 249 10*3/uL (ref 150–400)
RBC: 3.49 MIL/uL — AB (ref 3.87–5.11)
RDW: 16.8 % — ABNORMAL HIGH (ref 11.5–15.5)
WBC: 6.4 10*3/uL (ref 4.0–10.5)

## 2015-07-29 MED ORDER — SODIUM CHLORIDE 0.9 % IJ SOLN: 10.0000 mL | INTRAMUSCULAR | Status: DC | PRN

## 2015-07-29 MED ORDER — HEPARIN SOD (PORK) LOCK FLUSH 100 UNIT/ML IV SOLN
INTRAVENOUS | Status: AC
Start: 2015-07-29 — End: 2015-07-29
  Filled 2015-07-29: qty 5

## 2015-07-29 MED ORDER — SODIUM CHLORIDE 0.9 % IV SOLN
Freq: Once | INTRAVENOUS | Status: AC
  Administered 2015-07-29: 10:00:00 via INTRAVENOUS

## 2015-07-29 MED ORDER — SODIUM CHLORIDE 0.9 % IV SOLN
3.0000 mg/kg | Freq: Once | INTRAVENOUS | Status: AC
  Administered 2015-07-29: 110 mg via INTRAVENOUS
  Filled 2015-07-29: qty 11

## 2015-07-29 MED ORDER — HEPARIN SOD (PORK) LOCK FLUSH 100 UNIT/ML IV SOLN
500.0000 [IU] | Freq: Once | INTRAVENOUS | Status: AC | PRN
  Administered 2015-07-29: 500 [IU]

## 2015-07-29 NOTE — Patient Instructions (Signed)
[place] at [facility] Discharge Instructions  RECOMMENDATIONS MADE BY THE CONSULTANT AND ANY TEST RESULTS WILL BE SENT TO YOUR REFERRING PHYSICIAN.  Exam and discussion by Robynn Pane, PA-C If blood counts are ok will treat today. Scans showed response to therapy. Refill for pain medication - take as directed. Chemotherapy every 2 weeks Report uncontrolled nausea, vomiting or other concerns.  Follow-up in 4 weeks. Thank you for choosing [place] at [facility] to provide your oncology and hematology care.  To afford each patient quality time with our provider, please arrive at least 15 minutes before your scheduled appointment time.    You need to re-schedule your appointment should you arrive 10 or more minutes late.  We strive to give you quality time with our providers, and arriving late affects you and other patients whose appointments are after yours.  Also, if you no show three or more times for appointments you may be dismissed from the clinic at the providers discretion.     Again, thank you for choosing [facility].  Our hope is that these requests will decrease the amount of time that you wait before being seen by our physicians.       _____________________________________________________________  Should you have questions after your visit to [facility], please contact our office at [phone] between the hours of 8:30 a.m. and 4:30 p.m.  Voicemails left after 4:30 p.m. will not be returned until the following business day.  For prescription refill requests, have your pharmacy contact our office.

## 2015-07-29 NOTE — Progress Notes (Signed)
LABS DRAWN VENOUSLY

## 2015-07-29 NOTE — Progress Notes (Signed)
Melissa Cahill, MD  [place] [address]  Cancer of upper lobe of right lung  CURRENT THERAPY: Nivolumab/zometa  INTERVAL HISTORY: Melissa Sullivan 59 y.o. 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     Cancer of upper lobe of right lung   07/28/2014 Imaging CT chest: Large R apical mass consistent with malignancy. This is destroying the R 2nd rib with extension into adjacent soft tissue. R hilar adenopathy with R 5cm adrenal metastatic lesion.   08/01/2014 Initial Biopsy Lung, needle/core biopsy(ies), right upper lobe - POORLY DIFFERENTIATED ADENOCARCINOMA, SEE COMMENT.   08/08/2014 PET scan Large hypermetabolic R apical mass with evidence of direct chest wall and mediastinal invasion, right retrocrural lymphadenopathy, extensive retroperitoneal lymphadenopathy, and metastatic lesions to the adrenal glands    09/02/2014 - 11/04/2014 Chemotherapy Cisplatin/Pemetrexed/Avastin every 21 days x 4 cycles   10/07/2014 - 10/27/2014 Radiation Therapy Right lung apex for control of brachioplexopathy.   12/24/2014 - 02/25/2015 Chemotherapy Alimta/Avastin every 21 days.   02/20/2015 Imaging Increase in size of right adrenal metastasis and subjacent confluent retrocaval lymphadenopathy   02/25/2015 -  Chemotherapy Nivolumab, zometa   05/04/2015 Imaging CT CAP- Stable to slight decrease in the posterior right apical lesion. Stable appearance of posterior right upper rib an upper thoracic bony lesions. Slight improvement in right upper lobe tree-in-bud opacity. No new or progressive findings in...   07/28/2015 Imaging CT CAP- Reduced size of the right apical pleural parenchymal lesion and reduced size of the right adrenal metastatic lesion. Resolution of prior retrocrural adenopathy.  Right eccentric T1 and T2 sclerosis with sclerosis and tapering of the right second..    I  personally reviewed and went over radiographic studies with the patient.  The results are noted within this dictation.  CT CAP demonstrates a response to Nivolumab therapy.  TSH has been WNL.  I personally reviewed and went over laboratory results with the patient.  The results are noted within this dictation.  They are being updated today prior to immunotherapy administration.  She denies any complaints today.  She is smiling with the CT imaging news.    She is down to smoking 2 cigarettes daily with the help of the nicotine patch.  Continue smoking cessation is encouraged.   Past Medical History  Diagnosis Date  . Hypertension   . Reflux   . Hyperlipidemia   . Lung mass 07/28/2014  . Adrenal mass, right 07/28/2014  . Diabetes mellitus without complication   . Cancer     lung  right    has Adrenal mass, right; Cancer of upper lobe of right lung; ARF (acute renal failure); Abnormal finding on imaging; Oropharyngeal dysphagia; Nausea and vomiting; Hypercalcemia; Anemia in neoplastic disease; Dysphagia; and Encounter for screening colonoscopy on her problem list.     has No Known Allergies.  Current Outpatient Prescriptions on File Prior to Visit  Medication Sig Dispense Refill  . Alum & Mag Hydroxide-Simeth (MAGIC MOUTHWASH W/LIDOCAINE) SOLN Take 5 mLs by mouth 4 (four) times daily as needed for mouth pain. 400 mL 4  . Fluticasone-Salmeterol (ADVAIR DISKUS) 500-50 MCG/DOSE AEPB Two puffs at the same time daily. 60 each 6  . folic acid (FOLVITE) 1 MG tablet Take 1 mg by mouth every morning.    . hydrochlorothiazide (MICROZIDE) 12.5 MG capsule Take 12.5 mg by mouth every morning.     Marland Kitchen  HYDROcodone-homatropine (HYCODAN) 5-1.5 MG/5ML syrup Take 5 mLs by mouth every 6 (six) hours as needed for cough. 200 mL 0  . ibuprofen (ADVIL,MOTRIN) 200 MG tablet Take 200-800 mg by mouth once as needed for headache or mild pain.    . Iron-FA-B Cmp-C-Biot-Probiotic (FUSION PLUS) CAPS Take 1 capsule by mouth  daily.     Marland Kitchen lidocaine-prilocaine (EMLA) cream Apply a quarter size amount to port site 1 hour prior to chemo. Do not rub in. Cover with plastic wrap. 30 g 3  . megestrol (MEGACE) 40 MG/ML suspension Take 400 mg by mouth daily.     . metFORMIN (GLUCOPHAGE) 500 MG tablet Take 1 tablet (500 mg total) by mouth 2 (two) times daily with a meal. 60 tablet 2  . metoCLOPramide (REGLAN) 10 MG tablet Take 1 tablet (10 mg total) by mouth 4 (four) times daily. 120 tablet 3  . Nivolumab (OPDIVO IV) Inject into the vein every 14 (fourteen) days.    Marland Kitchen omeprazole (PRILOSEC) 20 MG capsule Take 1 capsule (20 mg total) by mouth 2 (two) times daily before a meal. 60 capsule 11  . ondansetron (ZOFRAN) 8 MG tablet Take 1 tablet (8 mg total) by mouth every 8 (eight) hours as needed for nausea or vomiting. 60 tablet 1  . Oxycodone HCl 10 MG TABS Take 1-2 tablets (10-20 mg total) by mouth every 6 (six) hours as needed (pain.). 100 tablet 0  . potassium chloride SA (K-DUR,KLOR-CON) 20 MEQ tablet Take 1 tablet (20 mEq total) by mouth daily. 30 tablet 0  . prochlorperazine (COMPAZINE) 10 MG tablet Starting the day after chemo, take 1 tab four times a day x 2 days. Then may take 1 tab four times a day IF needed for nausea/vomiting. 60 tablet 2  . senna (SENOKOT) 8.6 MG tablet Take 2 tablets by mouth at bedtime.     . simvastatin (ZOCOR) 40 MG tablet Take 40 mg by mouth every morning.     . triamcinolone (KENALOG) 0.025 % cream Apply 1 application topically 2 (two) times daily.    . predniSONE (DELTASONE) 10 MG tablet Prednisone $RemoveBeforeDEI'80mg'rKFEwNeeZabgHvah$  (8 tabs) by mouth daily x 3 days, then $RemoveBe'70mg'oKAKbVyQG$  (7 tabs) x 3 days, $Remov'60mg'tGODSi$  (6 tabs) x 3 days, $Remov'50mg'jPytED$  (5 tabs) x 3 days, $Remov'40mg'Zptdkm$  (4 tabs) x 3 days, $Remov'30mg'xbpuQp$  (3 tabs) x 3 days, $Remov'20mg'cdLQPE$  (2 tabs) x 3 days, $Remov'10mg'qadMNO$  (1 tab) x 3 days, $Remov'5mg'XjEGGG$  (1/2 tab) x 3 days. Start @ 1st sign of diarrhea. (Patient not taking: Reported on 07/29/2015) 120 tablet 0   Current Facility-Administered Medications on File Prior to Visit  Medication Dose  Route Frequency Provider Last Rate Last Dose  . 0.9 %  sodium chloride infusion   Intravenous Once Patrici Ranks, MD      . heparin lock flush 100 unit/mL  500 Units Intracatheter Once PRN Patrici Ranks, MD      . sodium chloride 0.9 % injection 10 mL  10 mL Intracatheter PRN Patrici Ranks, MD        Past Surgical History  Procedure Laterality Date  . Appendectomy    . Lung biopsy Right 07/2014    CT guided  . Portacath placement Left 09/01/2014  . Esophagogastroduodenoscopy N/A 12/09/2014    [facility]:EYEMVV esophageal stricture/mild-to-noderate erosive gastritis. negative H.pylori  . Savory dilation N/A 12/09/2014    Procedure: SAVORY DILATION;  Surgeon: [name], MD;  Location: [facility];  Service: Endoscopy;  Laterality: N/A;  [name] dilation  N/A 12/09/2014    Procedure: [name] DILATION;  Surgeon: [name], MD;  Location: [facility];  Service: Endoscopy;  Laterality: N/A;  . Flexible sigmoidoscopy  2011    Dr. [name]: hyperplastic polyp    Denies any headaches, dizziness, double vision, fevers, chills, night sweats, nausea, vomiting, diarrhea, constipation, chest pain, heart palpitations, shortness of breath, blood in stool, black tarry stool, urinary pain, urinary burning, urinary frequency, hematuria.   PHYSICAL EXAMINATION  ECOG PERFORMANCE STATUS: 1 - Symptomatic but completely ambulatory  Filed Vitals:   07/29/15 0849  BP: 96/69  Pulse: 93  Temp: 98.1 F (36.7 C)  Resp: 18    GENERAL:alert, no distress, cachectic, comfortable, cooperative, smiling and accompanied by sister and daughter. SKIN: skin color, texture, turgor are normal, no rashes or significant lesions HEAD: Normocephalic, No masses, lesions, tenderness or abnormalities EYES: normal, PERRLA, EOMI, Conjunctiva are pink and non-injected EARS: External ears normal OROPHARYNX:lips, buccal mucosa, and tongue normal and mucous membranes are moist  NECK: supple, no adenopathy, thyroid  normal size, non-tender, without nodularity, no stridor, non-tender, trachea midline LYMPH:  no palpable lymphadenopathy, no hepatosplenomegaly BREAST:not examined LUNGS: clear to auscultation and percussion HEART: regular rate & rhythm, no murmurs, no gallops, S1 normal and S2 normal ABDOMEN:abdomen soft, non-tender, normal bowel sounds and no masses or organomegaly BACK: Back symmetric, no curvature., No CVA tenderness EXTREMITIES:less then 2 second capillary refill, no joint deformities, effusion, or inflammation, no edema, no skin discoloration, no cyanosis  NEURO: alert & oriented x 3 with fluent speech, no focal motor/sensory deficits, gait normal    LABORATORY DATA: CBC    Component Value Date/Time   WBC 7.1 07/15/2015 1010   RBC 3.50* 07/15/2015 1010   RBC 2.45* 01/15/2015 0901   HGB 10.8* 07/15/2015 1010   HCT 32.8* 07/15/2015 1010   PLT 231 07/15/2015 1010   MCV 93.7 07/15/2015 1010   MCH 30.9 07/15/2015 1010   MCHC 32.9 07/15/2015 1010   RDW 16.6* 07/15/2015 1010   LYMPHSABS 1.9 07/15/2015 1010   MONOABS 0.8 07/15/2015 1010   EOSABS 0.3 07/15/2015 1010   BASOSABS 0.0 07/15/2015 1010      Chemistry      Component Value Date/Time   NA 135 07/15/2015 1010   K 4.1 07/15/2015 1010   CL 104 07/15/2015 1010   CO2 25 07/15/2015 1010   BUN 15 07/15/2015 1010   CREATININE 1.52* 07/15/2015 1010      Component Value Date/Time   CALCIUM 9.8 07/15/2015 1010   CALCIUM 9.7 06/03/2015 1000   ALKPHOS 48 07/15/2015 1010   AST 18 07/15/2015 1010   ALT 13* 07/15/2015 1010   BILITOT 0.2* 07/15/2015 1010        PENDING LABS:   RADIOGRAPHIC STUDIES:  Ct Chest W Contrast  07/28/2015   CLINICAL DATA:  Restaging of non-small cell lung cancer the right upper lobe with adrenal metastatic disease. Chemotherapy and radiation therapy. Hypertension. Diabetes.  EXAM: CT CHEST, ABDOMEN, AND PELVIS WITH CONTRAST  TECHNIQUE: Multidetector CT imaging of the chest, abdomen and pelvis  was performed following the standard protocol during bolus administration of intravenous contrast.  CONTRAST:  65m OMNIPAQUE IOHEXOL 300 MG/ML  SOLN  COMPARISON:  Multiple exams, including 05/04/2015  FINDINGS: CT CHEST FINDINGS  Mediastinum/Nodes: Right hilar lymph node 1 cm in short axis, formerly the same. Small AP window and right paratracheal lymph nodes appear stable. There is potentially mild wall thickening in the upper thoracic esophagus. Left Port-A-Cath tip: SVC.  Mild aortic arch atherosclerotic calcification.  Lungs/Pleura: Unusual pleuroparenchymal lesion at the right lung apex appears generally somewhat reduced in size/thickness. For example, on image 9 of series 3 the lesion measures 2.4 cm in thickness, previously 3.0 cm. Continued adjacent interstitial accentuation in the apical segment right upper lobe.  Severe emphysema. Stable right middle lobe scarring. Plugging of a bronchiectatic lingular airway, images 28-31 series 3. Similar plugging further caudad in the lingula.  Musculoskeletal: Stable appearance of thin/gracile appearance of the medial portion of the right second rib with mixed lucency and sclerosis. Stable appearance of sclerotic lesions of the right T1 and T2 vertebra along with sclerosis in the right T2 transverse process.  CT ABDOMEN PELVIS FINDINGS  Hepatobiliary: Unremarkable  Pancreas: Unremarkable  Spleen: Unremarkable  Adrenals/Urinary Tract: Right adrenal mass 2.3 by 4.6 cm, by my measurements previously 3.0 by 4.8 cm. Otherwise unremarkable.  Stomach/Bowel: Unremarkable  Vascular/Lymphatic: Aortoiliac atherosclerotic vascular disease. At this point the prior pathologic right retrocrural adenopathy and thickening of the right hemidiaphragmatic crus has essentially resolved.  Reproductive: Unremarkable  Other: No supplemental non-categorized findings.  Musculoskeletal: Unremarkable  IMPRESSION: 1. Reduced size of the right apical pleural parenchymal lesion and reduced size of  the right adrenal metastatic lesion. Resolution of prior retrocrural adenopathy. 2. Right eccentric T1 and T2 sclerosis with sclerosis and tapering of the right second rib compatible with prior malignant involvement -these bony findings are unchanged. 3. Other imaging findings of potential clinical significance: Mild wall thickening in the upper thoracic esophagus, possible esophagitis ; emphysema ; mild airway plugging in the lingula; aortoiliac atherosclerosis.   Electronically Signed   By: Van Clines M.D.   On: 07/28/2015 10:22   Ct Abdomen Pelvis W Contrast  07/28/2015   CLINICAL DATA:  Restaging of non-small cell lung cancer the right upper lobe with adrenal metastatic disease. Chemotherapy and radiation therapy. Hypertension. Diabetes.  EXAM: CT CHEST, ABDOMEN, AND PELVIS WITH CONTRAST  TECHNIQUE: Multidetector CT imaging of the chest, abdomen and pelvis was performed following the standard protocol during bolus administration of intravenous contrast.  CONTRAST:  59m OMNIPAQUE IOHEXOL 300 MG/ML  SOLN  COMPARISON:  Multiple exams, including 05/04/2015  FINDINGS: CT CHEST FINDINGS  Mediastinum/Nodes: Right hilar lymph node 1 cm in short axis, formerly the same. Small AP window and right paratracheal lymph nodes appear stable. There is potentially mild wall thickening in the upper thoracic esophagus. Left Port-A-Cath tip: SVC. Mild aortic arch atherosclerotic calcification.  Lungs/Pleura: Unusual pleuroparenchymal lesion at the right lung apex appears generally somewhat reduced in size/thickness. For example, on image 9 of series 3 the lesion measures 2.4 cm in thickness, previously 3.0 cm. Continued adjacent interstitial accentuation in the apical segment right upper lobe.  Severe emphysema. Stable right middle lobe scarring. Plugging of a bronchiectatic lingular airway, images 28-31 series 3. Similar plugging further caudad in the lingula.  Musculoskeletal: Stable appearance of thin/gracile  appearance of the medial portion of the right second rib with mixed lucency and sclerosis. Stable appearance of sclerotic lesions of the right T1 and T2 vertebra along with sclerosis in the right T2 transverse process.  CT ABDOMEN PELVIS FINDINGS  Hepatobiliary: Unremarkable  Pancreas: Unremarkable  Spleen: Unremarkable  Adrenals/Urinary Tract: Right adrenal mass 2.3 by 4.6 cm, by my measurements previously 3.0 by 4.8 cm. Otherwise unremarkable.  Stomach/Bowel: Unremarkable  Vascular/Lymphatic: Aortoiliac atherosclerotic vascular disease. At this point the prior pathologic right retrocrural adenopathy and thickening of the right hemidiaphragmatic crus has essentially resolved.  Reproductive: Unremarkable  Other: No supplemental non-categorized findings.  Musculoskeletal: Unremarkable  IMPRESSION: 1. Reduced size of the right apical pleural parenchymal lesion and reduced size of the right adrenal metastatic lesion. Resolution of prior retrocrural adenopathy. 2. Right eccentric T1 and T2 sclerosis with sclerosis and tapering of the right second rib compatible with prior malignant involvement -these bony findings are unchanged. 3. Other imaging findings of potential clinical significance: Mild wall thickening in the upper thoracic esophagus, possible esophagitis ; emphysema ; mild airway plugging in the lingula; aortoiliac atherosclerosis.   Electronically Signed   By: Van Clines M.D.   On: 07/28/2015 10:22     PATHOLOGY:    ASSESSMENT AND PLAN:  Cancer of upper lobe of right lung Stage IV right adenocarcinoma pancoast tumor of the lung (EGFR and ALK negative) complicated by right brachial plexus neuralgia.  S/P XRT.  Currently on Nivolumab salvage therapy with Zometa for Hypercalcemia.  Recent CT imaging on 07/28/2015 demonstrates a continued response to therapy.  Due for treatment today. Pre-chemo labs are pending today.  TSH has been monitored and WNL most recently.  Refill on pain medication  provided today.   She continues with Nicotine patches and she is currently on STEP 2.  She is down to smoking 2 cigarettes daily (compared to 5 daily in the past).  Smoking cessation encouraged.  Return in 4 weeks for follow-up with continuation of pre-chemo labs and treatment every 2 weeks.  Repeat imaging in 8-12 weeks would be reasonable and dependent on clinical response.    THERAPY PLAN:  Continue treatment as planned.  All questions were answered. The patient knows to call the clinic with any problems, questions or concerns. We can certainly see the patient much sooner if necessary.  Patient and plan discussed with Dr. Ancil Linsey and she is in agreement with the aforementioned.   This note is electronically signed by: Robynn Pane, PA-C 07/29/2015 9:15 AM

## 2015-07-29 NOTE — Assessment & Plan Note (Addendum)
Stage IV right adenocarcinoma pancoast tumor of the lung (EGFR and ALK negative) complicated by right brachial plexus neuralgia.  S/P XRT.  Currently on Nivolumab salvage therapy with Zometa for Hypercalcemia.  Recent CT imaging on 07/28/2015 demonstrates a continued response to therapy.  Due for treatment today. Pre-chemo labs are pending today.  TSH has been monitored and WNL most recently.  Refill on pain medication provided today.   She continues with Nicotine patches and she is currently on STEP 2.  She is down to smoking 2 cigarettes daily (compared to 5 daily in the past).  Smoking cessation encouraged.  Return in 4 weeks for follow-up with continuation of pre-chemo labs and treatment every 2 weeks.  Repeat imaging in 8-12 weeks would be reasonable and dependent on clinical response.

## 2015-07-29 NOTE — Progress Notes (Signed)
Melissa Sullivan Tolerated chemotherapy well discharged ambulatory

## 2015-07-29 NOTE — Patient Instructions (Signed)
[facility] Discharge Instructions for Patients Receiving Chemotherapy  Today you received the following chemotherapy agents opdivo Please follow up as scheduled Call the clinic if you have any questions or concerns  To help prevent nausea and vomiting after your treatment, we encourage you to take your nausea medication    If you develop nausea and vomiting, or diarrhea that is not controlled by your medication, call the clinic.  The clinic phone number is [phone]. Office hours are Monday-Friday 8:30am-5:00pm.  BELOW ARE SYMPTOMS THAT SHOULD BE REPORTED IMMEDIATELY:  *FEVER GREATER THAN 101.0 F  *CHILLS WITH OR WITHOUT FEVER  NAUSEA AND VOMITING THAT IS NOT CONTROLLED WITH YOUR NAUSEA MEDICATION  *UNUSUAL SHORTNESS OF BREATH  *UNUSUAL BRUISING OR BLEEDING  TENDERNESS IN MOUTH AND THROAT WITH OR WITHOUT PRESENCE OF ULCERS  *URINARY PROBLEMS  *BOWEL PROBLEMS  UNUSUAL RASH Items with * indicate a potential emergency and should be followed up as soon as possible. If you have an emergency after office hours please contact your primary care physician or go to the nearest emergency department.  Please call the clinic during office hours if you have any questions or concerns.   You may also contact the Patient Navigator at [phone] should you have any questions or need assistance in obtaining follow up care. _____________________________________________________________________ Have you asked about our STAR program?    STAR stands for Survivorship Training and Rehabilitation, and this is a nationally recognized cancer care program that focuses on survivorship and rehabilitation.  Cancer and cancer treatments may cause problems, such as, pain, making you feel tired and keeping you from doing the things that you need or want to do. Cancer rehabilitation can help. Our goal is to reduce these troubling effects and help you have the best quality of life  possible.  You may receive a survey from a nurse that asks questions about your current state of health.  Based on the survey results, all eligible patients will be referred to the [facility] program for an evaluation so we can better serve you! A frequently asked questions sheet is available upon request.

## 2015-08-03 ENCOUNTER — Encounter: Payer: Self-pay | Admitting: Gastroenterology

## 2015-08-07 ENCOUNTER — Ambulatory Visit (HOSPITAL_COMMUNITY): Payer: 59

## 2015-08-12 ENCOUNTER — Encounter (HOSPITAL_COMMUNITY): Payer: 59

## 2015-08-12 ENCOUNTER — Encounter (HOSPITAL_COMMUNITY): Payer: Self-pay

## 2015-08-12 ENCOUNTER — Encounter (HOSPITAL_BASED_OUTPATIENT_CLINIC_OR_DEPARTMENT_OTHER): Payer: 59

## 2015-08-12 VITALS — BP 105/52 | HR 88 | Temp 98.1°F | Resp 16 | Wt 88.8 lb

## 2015-08-12 DIAGNOSIS — C7951 Secondary malignant neoplasm of bone: Secondary | ICD-10-CM | POA: Diagnosis not present

## 2015-08-12 DIAGNOSIS — C3411 Malignant neoplasm of upper lobe, right bronchus or lung: Secondary | ICD-10-CM | POA: Diagnosis not present

## 2015-08-12 DIAGNOSIS — E278 Other specified disorders of adrenal gland: Secondary | ICD-10-CM

## 2015-08-12 DIAGNOSIS — Z5112 Encounter for antineoplastic immunotherapy: Secondary | ICD-10-CM

## 2015-08-12 DIAGNOSIS — C7972 Secondary malignant neoplasm of left adrenal gland: Secondary | ICD-10-CM | POA: Diagnosis not present

## 2015-08-12 DIAGNOSIS — C7971 Secondary malignant neoplasm of right adrenal gland: Secondary | ICD-10-CM | POA: Diagnosis not present

## 2015-08-12 LAB — COMPREHENSIVE METABOLIC PANEL
ALBUMIN: 3.8 g/dL (ref 3.5–5.0)
ALK PHOS: 60 U/L (ref 38–126)
ALT: 8 U/L — ABNORMAL LOW (ref 14–54)
ANION GAP: 4 — AB (ref 5–15)
AST: 19 U/L (ref 15–41)
BUN: 21 mg/dL — ABNORMAL HIGH (ref 6–20)
CALCIUM: 9.6 mg/dL (ref 8.9–10.3)
CHLORIDE: 106 mmol/L (ref 101–111)
CO2: 25 mmol/L (ref 22–32)
Creatinine, Ser: 1.46 mg/dL — ABNORMAL HIGH (ref 0.44–1.00)
GFR calc Af Amer: 44 mL/min — ABNORMAL LOW (ref >60)
GFR calc non Af Amer: 38 mL/min — ABNORMAL LOW (ref >60)
GLUCOSE: 84 mg/dL (ref 65–99)
POTASSIUM: 4.3 mmol/L (ref 3.5–5.1)
SODIUM: 135 mmol/L (ref 135–145)
Total Bilirubin: 0.5 mg/dL (ref 0.3–1.2)
Total Protein: 7.7 g/dL (ref 6.5–8.1)

## 2015-08-12 LAB — TSH: TSH: 1.916 u[IU]/mL (ref 0.350–4.500)

## 2015-08-12 LAB — CBC WITH DIFFERENTIAL/PLATELET
BASOS ABS: 0 10*3/uL (ref 0.0–0.1)
BASOS PCT: 0 %
Eosinophils Absolute: 0.2 10*3/uL (ref 0.0–0.7)
Eosinophils Relative: 2 %
HEMATOCRIT: 32.3 % — AB (ref 36.0–46.0)
HEMOGLOBIN: 10.5 g/dL — AB (ref 12.0–15.0)
Lymphocytes Relative: 26 %
Lymphs Abs: 2 10*3/uL (ref 0.7–4.0)
MCH: 30.3 pg (ref 26.0–34.0)
MCHC: 32.5 g/dL (ref 30.0–36.0)
MCV: 93.4 fL (ref 78.0–100.0)
MONOS PCT: 10 %
Monocytes Absolute: 0.7 10*3/uL (ref 0.1–1.0)
NEUTROS PCT: 62 %
Neutro Abs: 4.7 10*3/uL (ref 1.7–7.7)
Platelets: 254 10*3/uL (ref 150–400)
RBC: 3.46 MIL/uL — ABNORMAL LOW (ref 3.87–5.11)
RDW: 17.1 % — ABNORMAL HIGH (ref 11.5–15.5)
WBC: 7.6 10*3/uL (ref 4.0–10.5)

## 2015-08-12 MED ORDER — HEPARIN SOD (PORK) LOCK FLUSH 100 UNIT/ML IV SOLN
500.0000 [IU] | Freq: Once | INTRAVENOUS | Status: DC | PRN
  Filled 2015-08-12: qty 5

## 2015-08-12 MED ORDER — SODIUM CHLORIDE 0.9 % IV SOLN
Freq: Once | INTRAVENOUS | Status: AC
  Administered 2015-08-12: 12:00:00 via INTRAVENOUS

## 2015-08-12 MED ORDER — SODIUM CHLORIDE 0.9 % IJ SOLN: 10.0000 mL | INTRAMUSCULAR | Status: DC | PRN

## 2015-08-12 MED ORDER — SODIUM CHLORIDE 0.9 % IV SOLN: Freq: Once | INTRAVENOUS | Status: DC

## 2015-08-12 MED ORDER — ZOLEDRONIC ACID 4 MG/5ML IV CONC
2.0000 mg | Freq: Once | INTRAVENOUS | Status: AC
Start: 2015-08-12 — End: 2015-08-12
  Administered 2015-08-12: 2 mg via INTRAVENOUS
  Filled 2015-08-12: qty 2.5

## 2015-08-12 MED ORDER — SODIUM CHLORIDE 0.9 % IV SOLN
3.0000 mg/kg | Freq: Once | INTRAVENOUS | Status: AC
  Administered 2015-08-12: 110 mg via INTRAVENOUS
  Filled 2015-08-12: qty 1

## 2015-08-12 NOTE — Progress Notes (Signed)
Leonard Downing Tolerated chemotherapy infusion and zometa well today Discharged ambulatoy

## 2015-08-12 NOTE — Progress Notes (Signed)
Please see zometa encounter for more information

## 2015-08-12 NOTE — Patient Instructions (Signed)
[facility] Discharge Instructions for Patients Receiving Chemotherapy  Today you received the following chemotherapy agents opdivo and zometa Follow up as scheduled Please call the clinic if you have any questions or concerns  To help prevent nausea and vomiting after your treatment, we encourage you to take your nausea medication   If you develop nausea and vomiting, or diarrhea that is not controlled by your medication, call the clinic.  The clinic phone number is [phone]. Office hours are Monday-Friday 8:30am-5:00pm.  BELOW ARE SYMPTOMS THAT SHOULD BE REPORTED IMMEDIATELY:  *FEVER GREATER THAN 101.0 F  *CHILLS WITH OR WITHOUT FEVER  NAUSEA AND VOMITING THAT IS NOT CONTROLLED WITH YOUR NAUSEA MEDICATION  *UNUSUAL SHORTNESS OF BREATH  *UNUSUAL BRUISING OR BLEEDING  TENDERNESS IN MOUTH AND THROAT WITH OR WITHOUT PRESENCE OF ULCERS  *URINARY PROBLEMS  *BOWEL PROBLEMS  UNUSUAL RASH Items with * indicate a potential emergency and should be followed up as soon as possible. If you have an emergency after office hours please contact your primary care physician or go to the nearest emergency department.  Please call the clinic during office hours if you have any questions or concerns.   You may also contact the Patient Navigator at [phone] should you have any questions or need assistance in obtaining follow up care. _____________________________________________________________________ Have you asked about our STAR program?    STAR stands for Survivorship Training and Rehabilitation, and this is a nationally recognized cancer care program that focuses on survivorship and rehabilitation.  Cancer and cancer treatments may cause problems, such as, pain, making you feel tired and keeping you from doing the things that you need or want to do. Cancer rehabilitation can help. Our goal is to reduce these troubling effects and help you have the best quality of life  possible.  You may receive a survey from a nurse that asks questions about your current state of health.  Based on the survey results, all eligible patients will be referred to the [facility] program for an evaluation so we can better serve you! A frequently asked questions sheet is available upon request.

## 2015-08-14 ENCOUNTER — Other Ambulatory Visit (HOSPITAL_COMMUNITY): Payer: Self-pay | Admitting: Hematology & Oncology

## 2015-08-26 ENCOUNTER — Encounter (HOSPITAL_BASED_OUTPATIENT_CLINIC_OR_DEPARTMENT_OTHER): Payer: 59 | Admitting: Hematology & Oncology

## 2015-08-26 ENCOUNTER — Encounter (HOSPITAL_COMMUNITY): Payer: Self-pay | Admitting: Hematology & Oncology

## 2015-08-26 ENCOUNTER — Encounter (HOSPITAL_COMMUNITY): Payer: 59 | Attending: Hematology and Oncology

## 2015-08-26 VITALS — BP 96/54 | HR 86 | Temp 98.0°F | Resp 18

## 2015-08-26 VITALS — BP 117/77 | HR 70 | Temp 98.1°F | Resp 18 | Wt 85.7 lb

## 2015-08-26 DIAGNOSIS — C7972 Secondary malignant neoplasm of left adrenal gland: Secondary | ICD-10-CM

## 2015-08-26 DIAGNOSIS — C7951 Secondary malignant neoplasm of bone: Secondary | ICD-10-CM

## 2015-08-26 DIAGNOSIS — C3411 Malignant neoplasm of upper lobe, right bronchus or lung: Secondary | ICD-10-CM | POA: Diagnosis present

## 2015-08-26 DIAGNOSIS — Z79899 Other long term (current) drug therapy: Secondary | ICD-10-CM | POA: Diagnosis not present

## 2015-08-26 DIAGNOSIS — J449 Chronic obstructive pulmonary disease, unspecified: Secondary | ICD-10-CM | POA: Diagnosis not present

## 2015-08-26 DIAGNOSIS — Z9089 Acquired absence of other organs: Secondary | ICD-10-CM | POA: Insufficient documentation

## 2015-08-26 DIAGNOSIS — C781 Secondary malignant neoplasm of mediastinum: Secondary | ICD-10-CM | POA: Insufficient documentation

## 2015-08-26 DIAGNOSIS — I10 Essential (primary) hypertension: Secondary | ICD-10-CM | POA: Diagnosis not present

## 2015-08-26 DIAGNOSIS — C7971 Secondary malignant neoplasm of right adrenal gland: Secondary | ICD-10-CM

## 2015-08-26 DIAGNOSIS — F17210 Nicotine dependence, cigarettes, uncomplicated: Secondary | ICD-10-CM | POA: Diagnosis not present

## 2015-08-26 DIAGNOSIS — C7989 Secondary malignant neoplasm of other specified sites: Secondary | ICD-10-CM | POA: Insufficient documentation

## 2015-08-26 DIAGNOSIS — Z5112 Encounter for antineoplastic immunotherapy: Secondary | ICD-10-CM

## 2015-08-26 DIAGNOSIS — K219 Gastro-esophageal reflux disease without esophagitis: Secondary | ICD-10-CM | POA: Diagnosis not present

## 2015-08-26 DIAGNOSIS — Z720 Tobacco use: Secondary | ICD-10-CM

## 2015-08-26 DIAGNOSIS — E278 Other specified disorders of adrenal gland: Secondary | ICD-10-CM

## 2015-08-26 DIAGNOSIS — D630 Anemia in neoplastic disease: Secondary | ICD-10-CM | POA: Diagnosis not present

## 2015-08-26 DIAGNOSIS — C772 Secondary and unspecified malignant neoplasm of intra-abdominal lymph nodes: Secondary | ICD-10-CM | POA: Diagnosis not present

## 2015-08-26 DIAGNOSIS — E785 Hyperlipidemia, unspecified: Secondary | ICD-10-CM | POA: Insufficient documentation

## 2015-08-26 DIAGNOSIS — R05 Cough: Secondary | ICD-10-CM

## 2015-08-26 LAB — COMPREHENSIVE METABOLIC PANEL
ALT: 61 U/L — ABNORMAL HIGH (ref 14–54)
ANION GAP: 9 (ref 5–15)
AST: 38 U/L (ref 15–41)
Albumin: 3.4 g/dL — ABNORMAL LOW (ref 3.5–5.0)
Alkaline Phosphatase: 84 U/L (ref 38–126)
BILIRUBIN TOTAL: 0.3 mg/dL (ref 0.3–1.2)
BUN: 17 mg/dL (ref 6–20)
CO2: 20 mmol/L — ABNORMAL LOW (ref 22–32)
Calcium: 10.3 mg/dL (ref 8.9–10.3)
Chloride: 104 mmol/L (ref 101–111)
Creatinine, Ser: 1.55 mg/dL — ABNORMAL HIGH (ref 0.44–1.00)
GFR calc Af Amer: 41 mL/min — ABNORMAL LOW (ref >60)
GFR, EST NON AFRICAN AMERICAN: 36 mL/min — AB (ref >60)
Glucose, Bld: 111 mg/dL — ABNORMAL HIGH (ref 65–99)
POTASSIUM: 3.6 mmol/L (ref 3.5–5.1)
Sodium: 133 mmol/L — ABNORMAL LOW (ref 135–145)
TOTAL PROTEIN: 8.3 g/dL — AB (ref 6.5–8.1)

## 2015-08-26 LAB — CBC WITH DIFFERENTIAL/PLATELET
Basophils Absolute: 0 10*3/uL (ref 0.0–0.1)
Basophils Relative: 0 %
Eosinophils Absolute: 0.2 10*3/uL (ref 0.0–0.7)
Eosinophils Relative: 1 %
HEMATOCRIT: 28.3 % — AB (ref 36.0–46.0)
Hemoglobin: 9.4 g/dL — ABNORMAL LOW (ref 12.0–15.0)
LYMPHS PCT: 14 %
Lymphs Abs: 1.9 10*3/uL (ref 0.7–4.0)
MCH: 31 pg (ref 26.0–34.0)
MCHC: 33.2 g/dL (ref 30.0–36.0)
MCV: 93.4 fL (ref 78.0–100.0)
MONO ABS: 0.8 10*3/uL (ref 0.1–1.0)
MONOS PCT: 6 %
NEUTROS ABS: 10.6 10*3/uL — AB (ref 1.7–7.7)
Neutrophils Relative %: 79 %
Platelets: 345 10*3/uL (ref 150–400)
RBC: 3.03 MIL/uL — ABNORMAL LOW (ref 3.87–5.11)
RDW: 16.5 % — AB (ref 11.5–15.5)
WBC: 13.4 10*3/uL — ABNORMAL HIGH (ref 4.0–10.5)

## 2015-08-26 LAB — TSH: TSH: 1.998 u[IU]/mL (ref 0.350–4.500)

## 2015-08-26 MED ORDER — SODIUM CHLORIDE 0.9 % IV SOLN
3.0000 mg/kg | Freq: Once | INTRAVENOUS | Status: AC
  Administered 2015-08-26: 110 mg via INTRAVENOUS
  Filled 2015-08-26: qty 1

## 2015-08-26 MED ORDER — AZITHROMYCIN 250 MG PO TABS: ORAL_TABLET | ORAL | Status: DC

## 2015-08-26 MED ORDER — HEPARIN SOD (PORK) LOCK FLUSH 100 UNIT/ML IV SOLN
500.0000 [IU] | Freq: Once | INTRAVENOUS | Status: AC | PRN
  Administered 2015-08-26: 500 [IU]
  Filled 2015-08-26: qty 5

## 2015-08-26 MED ORDER — SODIUM CHLORIDE 0.9 % IV SOLN
Freq: Once | INTRAVENOUS | Status: AC
  Administered 2015-08-26: 11:00:00 via INTRAVENOUS

## 2015-08-26 MED ORDER — DARBEPOETIN ALFA 500 MCG/ML IJ SOSY
500.0000 ug | PREFILLED_SYRINGE | Freq: Once | INTRAMUSCULAR | Status: AC
  Administered 2015-08-26: 500 ug via SUBCUTANEOUS
  Filled 2015-08-26: qty 1

## 2015-08-26 MED ORDER — SODIUM CHLORIDE 0.9 % IJ SOLN: 10.0000 mL | INTRAMUSCULAR | Status: DC | PRN

## 2015-08-26 NOTE — Progress Notes (Signed)
[name], stage IV, bilateral adrenal metastases, retroperitoneal lymph nodes  Biopsy on 08/01/2014 c/w poorly differentiated adenocarcinoma  XRT completed on 10/27/2014  Right Brachial plexus neuralgia  EGFR mutation negative ALK mutation negative  Extensive soft tissue thickening in the posterior nasopharynx with bilateral level IIa lymphadenopathy in the neck. The possibility of a second primary neoplasm in this region warrants consideration. Clinical correlation is recommended. See onc hx...  CURRENT THERAPY: Nivolumab/zometa/Aranesp  INTERVAL HISTORY: Melissa Sullivan 59 y.o. female returns for follow-up of stage IV adenocarcinoma of the lung. She is doing well.  She sleeps ok. She denies any pain. Her mood is good. She notes some days she is "too busy." She has no other major complaints.Last CT scans showed ongoing improvement in her disease.   She would like to start Mucinex and an antibiotic for her cough. She notes that starting last week she began feeling tired and coughing up clear to yellow mucus. She denies progressive or worsening SOB.  She has no significant change in  appetite.  She does eat fruit and drink Boost.  She has no other complaints at this time.  Denies fever.     MEDICAL HISTORY: Past Medical History  Diagnosis Date  . Hypertension   . Reflux   . Hyperlipidemia   . Lung mass 07/28/2014  . Adrenal mass, right ([place]) 07/28/2014  . Diabetes mellitus without complication ([place])   . Cancer [facility])     lung  right    has Adrenal mass, right ([place]); Cancer of upper lobe of right lung ([place]); ARF (acute renal failure) ([place]); Abnormal finding on imaging; Oropharyngeal dysphagia; Nausea and vomiting; Hypercalcemia; Anemia in neoplastic disease; Dysphagia; and Encounter for screening colonoscopy on her problem list.      Cancer of upper lobe of right lung ([place])   07/28/2014 Imaging CT chest: Large R apical mass consistent with malignancy. This is destroying  the R 2nd rib with extension into adjacent soft tissue. R hilar adenopathy with R 5cm adrenal metastatic lesion.   08/01/2014 Initial Biopsy Lung, needle/core biopsy(ies), right upper lobe - POORLY DIFFERENTIATED ADENOCARCINOMA, SEE COMMENT.   08/08/2014 PET scan Large hypermetabolic R apical mass with evidence of direct chest wall and mediastinal invasion, right retrocrural lymphadenopathy, extensive retroperitoneal lymphadenopathy, and metastatic lesions to the adrenal glands    09/02/2014 - 11/04/2014 Chemotherapy Cisplatin/Pemetrexed/Avastin every 21 days x 4 cycles   10/07/2014 - 10/27/2014 Radiation Therapy Right lung apex for control of brachioplexopathy.   12/24/2014 - 02/25/2015 Chemotherapy Alimta/Avastin every 21 days.   02/20/2015 Imaging Increase in size of right adrenal metastasis and subjacent confluent retrocaval lymphadenopathy   02/25/2015 -  Chemotherapy Nivolumab, zometa   05/04/2015 Imaging CT CAP- Stable to slight decrease in the posterior right apical lesion. Stable appearance of posterior right upper rib an upper thoracic bony lesions. Slight improvement in right upper lobe tree-in-bud opacity. No new or progressive findings in...   07/28/2015 Imaging CT CAP- Reduced size of the right apical pleural parenchymal lesion and reduced size of the right adrenal metastatic lesion. Resolution of prior retrocrural adenopathy.  Right eccentric T1 and T2 sclerosis with sclerosis and tapering of the right second..     has No Known Allergies.  Melissa Sullivan does not currently have medications on file.  SURGICAL HISTORY: Past Surgical History  Procedure Laterality Date  . Appendectomy    . Lung biopsy Right 07/2014    CT guided  . Portacath placement Left 09/01/2014  . Esophagogastroduodenoscopy  N/A 12/09/2014    NTI:RWERXV esophageal stricture/mild-to-noderate erosive gastritis. negative H.pylori  . Savory dilation N/A 12/09/2014    Procedure: SAVORY DILATION;  Surgeon: [name], MD;   [facility];  Service: Endoscopy;  Laterality: N/A;  [name] dilation N/A 12/09/2014    Procedure: [name] DILATION;  Surgeon: [name], MD;  Location: [facility];  Service: Endoscopy;  Laterality: N/A;  . Flexible sigmoidoscopy  2011    Dr. [name]: hyperplastic polyp    SOCIAL HISTORY: Social History   Social History  . Marital Status: Married    Spouse Name: N/A  . Number of Children: N/A  . Years of Education: N/A   Occupational History  . Not on file.   Social History Main Topics  . Smoking status: Light Tobacco Smoker -- 0.30 packs/day    Types: Cigarettes  . Smokeless tobacco: Never Used  . Alcohol Use: No  . Drug Use: No  . Sexual Activity: Not on file   Other Topics Concern  . Not on file   Social History Narrative    FAMILY HISTORY: Family History  Problem Relation Age of Onset  . Cancer Sister     Review of Systems  Constitutional: Negative for fatigue. Negative for fever and chills.  HENT: Negative for congestion, hearing loss, nosebleeds, sore throat and tinnitus.   Eyes: Negative for blurred vision, double vision, pain and discharge.  Respiratory: Negative for cough, hemoptysis, sputum production, shortness of breath and wheezing.   Cardiovascular: Negative for chest pain, palpitations, claudication, leg swelling and PND.  Gastrointestinal: . Negative for heartburn, nausea, vomiting, diarrhea, constipation, blood in stool and melena.  Genitourinary: Negative for dysuria, urgency, frequency and hematuria.  Musculoskeletal: Negative for myalgias, joint pain and falls.  Skin: Negative for itching and rash.  Neurological: Negative for dizziness, tingling, tremors, sensory change, speech change, focal weakness, seizures, loss of consciousness, weakness and headaches.  Endo/Heme/Allergies: Does not bruise/bleed easily.  Psychiatric/Behavioral: Negative for depression, suicidal ideas, memory loss and substance abuse. The patient is not  nervous/anxious and does not have insomnia.   14 point review of systems was performed and is negative except as detailed under history of present illness and above   PHYSICAL EXAMINATION  ECOG PERFORMANCE STATUS: 1 - Symptomatic but completely ambulatory  Filed Vitals:   08/26/15 0909  BP: 117/77  Pulse: 70  Temp: 98.1 F (36.7 C)  Resp: 18    Physical Exam  Constitutional: She is oriented to person, place, and time.  HENT:  Head: Normocephalic and atraumatic.  Nose: Nose normal.  Mouth/Throat: Oropharynx is clear and moist. No oropharyngeal exudate.  Eyes: Conjunctivae and EOM are normal. Pupils are equal, round, and reactive to light. Right eye exhibits no discharge. Left eye exhibits no discharge. No scleral icterus.  Neck: Normal range of motion. Neck supple. No tracheal deviation present. No thyromegaly present.  Cardiovascular: Normal rate, regular rhythm and normal heart sounds.  Exam reveals no gallop and no friction rub.   No murmur heard. Pulmonary/Chest: Effort normal and breath sounds normal. She has no wheezes. She has no rales.  Abdominal: Soft. Bowel sounds are normal. She exhibits no distension and no mass. There is no tenderness. There is no rebound and no guarding.  Thin  Musculoskeletal: Normal range of motion. She exhibits no edema.  Lymphadenopathy:    She has no cervical adenopathy.  Neurological: She is alert and oriented to person, place, and time. She has normal reflexes. No cranial  nerve deficit. Gait normal. Coordination normal.  Skin: Skin is warm and dry. No rash noted.  Psychiatric: Mood, memory, affect and judgment normal.  Nursing note and vitals reviewed.   LABORATORY DATA: I have reviewed the data below as listed. CBC    Component Value Date/Time   WBC 13.4* 08/26/2015 0945   RBC 3.03* 08/26/2015 0945   RBC 2.45* 01/15/2015 0901   HGB 9.4* 08/26/2015 0945   HCT 28.3* 08/26/2015 0945   PLT 345 08/26/2015 0945   MCV 93.4 08/26/2015  0945   MCH 31.0 08/26/2015 0945   MCHC 33.2 08/26/2015 0945   RDW 16.5* 08/26/2015 0945   LYMPHSABS 1.9 08/26/2015 0945   MONOABS 0.8 08/26/2015 0945   EOSABS 0.2 08/26/2015 0945   BASOSABS 0.0 08/26/2015 0945   CMP     Component Value Date/Time   NA 133* 08/26/2015 0945   K 3.6 08/26/2015 0945   CL 104 08/26/2015 0945   CO2 20* 08/26/2015 0945   GLUCOSE 111* 08/26/2015 0945   BUN 17 08/26/2015 0945   CREATININE 1.55* 08/26/2015 0945   CALCIUM 10.3 08/26/2015 0945   CALCIUM 9.7 06/03/2015 1000   PROT 8.3* 08/26/2015 0945   ALBUMIN 3.4* 08/26/2015 0945   AST 38 08/26/2015 0945   ALT 61* 08/26/2015 0945   ALKPHOS 84 08/26/2015 0945   BILITOT 0.3 08/26/2015 0945   GFRNONAA 36* 08/26/2015 0945   GFRAA 41* 08/26/2015 0945   RADIOLOGY:  CLINICAL DATA: Subsequent encounter for non-small-cell lung cancer  EXAM: CT CHEST, ABDOMEN, AND PELVIS WITH CONTRAST  TECHNIQUE: Multidetector CT imaging of the chest, abdomen and pelvis was performed following the standard protocol during bolus administration of intravenous contrast.  CONTRAST: 27m OMNIPAQUE IOHEXOL 300 MG/ML SOLN  COMPARISON: 02/20/2015. IMPRESSION: 1. Stable to slight decrease in the posterior right apical lesion. 2. Stable appearance of posterior right upper rib an upper thoracic bony lesions. 3. Slight improvement in right upper lobe tree-in-bud opacity. No new or progressive findings in the lungs. 4. Interval decrease in right adrenal metastatic lesion and associated aortocaval lymphadenopathy.   Electronically Signed  By: EMisty StanleyM.D.  On: 05/04/2015 12:03   ASSESSMENT and THERAPY PLAN:   Stage IV adenocarcinoma of the lung, EGFR and ALK negative  She is certainly doing well clinically. We will continue with current therapy. She has absolutely no major complaints today. Again reviewed the potential side effects of her current treatment.  For her cough we will prescribe zithromax  and mucinex. She is to call with worsening cough, SOB or fever.  Hypercalcemia/bony metastatic disease  She will continue with Zometa as prescribed.  Tobacco ABUSE  I re-emphasized the benefits of smoking cessation. I have offered her methods to quit in the past. She will continue to think on this.  FLU VACCINATION  I am not sure what the final policy is regarding flu vaccination and immunotherapy. I advised the patient we will let her know at follow-up. We will plan on giving her the flu vaccine then.  We will plan on a four week follow-up with laboratory studies, chemotherapy and an office visit. She will continue with every 2 week nivolumab.  All questions were answered. The patient knows to call the clinic with any problems, questions or concerns. We can certainly see the patient much sooner if necessary.   This document serves as a record of services personally performed by SAncil Linsey MD. It was created on her behalf by DJanace Hoard a trained medical scribe. The creation  of this record is based on the scribe's personal observations and the provider's statements to them. This document has been checked and approved by the attending provider.  I have reviewed the above documentation for accuracy and completeness, and I agree with the above.  This note was signed electronically  Larene Beach K. Whitney Muse, MD

## 2015-08-26 NOTE — Patient Instructions (Addendum)
..  [place] at [facility] Discharge Instructions  RECOMMENDATIONS MADE BY THE CONSULTANT AND ANY TEST RESULTS WILL BE SENT TO YOUR REFERRING PHYSICIAN.  Exam per Dr. Leretha Pol every 2 weeks We will discuss flu shot when you return in 2 weeks Antibiotics sent to drug store per Dr. Whitney Muse 1 month appt with Gershon Mussel  Thank you for choosing [place] at [facility] to provide your oncology and hematology care.  To afford each patient quality time with our provider, please arrive at least 15 minutes before your scheduled appointment time.    You need to re-schedule your appointment should you arrive 10 or more minutes late.  We strive to give you quality time with our providers, and arriving late affects you and other patients whose appointments are after yours.  Also, if you no show three or more times for appointments you may be dismissed from the clinic at the providers discretion.     Again, thank you for choosing [facility].  Our hope is that these requests will decrease the amount of time that you wait before being seen by our physicians.       _____________________________________________________________  Should you have questions after your visit to [facility], please contact our office at [phone] between the hours of 8:30 a.m. and 4:30 p.m.  Voicemails left after 4:30 p.m. will not be returned until the following business day.  For prescription refill requests, have your pharmacy contact our office.

## 2015-08-26 NOTE — Patient Instructions (Signed)
[facility] Discharge Instructions for Patients Receiving Chemotherapy  Today you received the following chemotherapy agents Opdivo.  To help prevent nausea and vomiting after your treatment, we encourage you to take your nausea medication as prescribed.     If you develop nausea and vomiting, or diarrhea that is not controlled by your medication, call the clinic.  The clinic phone number is [phone]. Office hours are Monday-Friday 8:30am-5:00pm.  BELOW ARE SYMPTOMS THAT SHOULD BE REPORTED IMMEDIATELY:  *FEVER GREATER THAN 101.0 F  *CHILLS WITH OR WITHOUT FEVER  NAUSEA AND VOMITING THAT IS NOT CONTROLLED WITH YOUR NAUSEA MEDICATION  *UNUSUAL SHORTNESS OF BREATH  *UNUSUAL BRUISING OR BLEEDING  TENDERNESS IN MOUTH AND THROAT WITH OR WITHOUT PRESENCE OF ULCERS  *URINARY PROBLEMS  *BOWEL PROBLEMS  UNUSUAL RASH Items with * indicate a potential emergency and should be followed up as soon as possible. If you have an emergency after office hours please contact your primary care physician or go to the nearest emergency department.  Please call the clinic during office hours if you have any questions or concerns.   You may also contact the Patient Navigator at [phone] should you have any questions or need assistance in obtaining follow up care. _____________________________________________________________________ Have you asked about our STAR program?    STAR stands for Survivorship Training and Rehabilitation, and this is a nationally recognized cancer care program that focuses on survivorship and rehabilitation.  Cancer and cancer treatments may cause problems, such as, pain, making you feel tired and keeping you from doing the things that you need or want to do. Cancer rehabilitation can help. Our goal is to reduce these troubling effects and help you have the best quality of life possible.  You may receive a survey from a nurse that asks questions about  your current state of health.  Based on the survey results, all eligible patients will be referred to the STAR program for an evaluation so we can better serve you! A frequently asked questions sheet is available upon request.           

## 2015-08-26 NOTE — Progress Notes (Signed)
Dr. Whitney Muse notified of pt's creatinine 1.55.  Okay to treat today per MD.

## 2015-09-09 ENCOUNTER — Encounter (HOSPITAL_BASED_OUTPATIENT_CLINIC_OR_DEPARTMENT_OTHER): Payer: 59

## 2015-09-09 ENCOUNTER — Encounter (HOSPITAL_COMMUNITY): Payer: 59

## 2015-09-09 VITALS — BP 121/62 | HR 90 | Temp 98.6°F | Resp 16 | Wt 87.8 lb

## 2015-09-09 DIAGNOSIS — C3411 Malignant neoplasm of upper lobe, right bronchus or lung: Secondary | ICD-10-CM | POA: Diagnosis not present

## 2015-09-09 DIAGNOSIS — C7971 Secondary malignant neoplasm of right adrenal gland: Secondary | ICD-10-CM

## 2015-09-09 DIAGNOSIS — C7951 Secondary malignant neoplasm of bone: Secondary | ICD-10-CM

## 2015-09-09 DIAGNOSIS — Z5112 Encounter for antineoplastic immunotherapy: Secondary | ICD-10-CM | POA: Diagnosis not present

## 2015-09-09 DIAGNOSIS — C7972 Secondary malignant neoplasm of left adrenal gland: Secondary | ICD-10-CM

## 2015-09-09 DIAGNOSIS — E278 Other specified disorders of adrenal gland: Secondary | ICD-10-CM

## 2015-09-09 LAB — COMPREHENSIVE METABOLIC PANEL
ALT: 9 U/L — ABNORMAL LOW (ref 14–54)
ANION GAP: 8 (ref 5–15)
AST: 17 U/L (ref 15–41)
Albumin: 3.8 g/dL (ref 3.5–5.0)
Alkaline Phosphatase: 52 U/L (ref 38–126)
BILIRUBIN TOTAL: 0.3 mg/dL (ref 0.3–1.2)
BUN: 12 mg/dL (ref 6–20)
CHLORIDE: 107 mmol/L (ref 101–111)
CO2: 21 mmol/L — ABNORMAL LOW (ref 22–32)
Calcium: 11.1 mg/dL — ABNORMAL HIGH (ref 8.9–10.3)
Creatinine, Ser: 1.52 mg/dL — ABNORMAL HIGH (ref 0.44–1.00)
GFR, EST AFRICAN AMERICAN: 42 mL/min — AB (ref >60)
GFR, EST NON AFRICAN AMERICAN: 36 mL/min — AB (ref >60)
Glucose, Bld: 111 mg/dL — ABNORMAL HIGH (ref 65–99)
POTASSIUM: 4.1 mmol/L (ref 3.5–5.1)
Sodium: 136 mmol/L (ref 135–145)
TOTAL PROTEIN: 8 g/dL (ref 6.5–8.1)

## 2015-09-09 LAB — CBC WITH DIFFERENTIAL/PLATELET
Basophils Absolute: 0 10*3/uL (ref 0.0–0.1)
Basophils Relative: 0 %
Eosinophils Absolute: 0.1 10*3/uL (ref 0.0–0.7)
Eosinophils Relative: 1 %
HEMATOCRIT: 34 % — AB (ref 36.0–46.0)
Hemoglobin: 10.7 g/dL — ABNORMAL LOW (ref 12.0–15.0)
LYMPHS PCT: 27 %
Lymphs Abs: 1.7 10*3/uL (ref 0.7–4.0)
MCH: 31.4 pg (ref 26.0–34.0)
MCHC: 31.5 g/dL (ref 30.0–36.0)
MCV: 99.7 fL (ref 78.0–100.0)
MONO ABS: 0.6 10*3/uL (ref 0.1–1.0)
MONOS PCT: 9 %
NEUTROS ABS: 4.1 10*3/uL (ref 1.7–7.7)
Neutrophils Relative %: 63 %
PLATELETS: 270 10*3/uL (ref 150–400)
RBC: 3.41 MIL/uL — ABNORMAL LOW (ref 3.87–5.11)
RDW: 20.4 % — AB (ref 11.5–15.5)
WBC: 6.6 10*3/uL (ref 4.0–10.5)

## 2015-09-09 LAB — TSH: TSH: 1.401 u[IU]/mL (ref 0.350–4.500)

## 2015-09-09 MED ORDER — ZOLEDRONIC ACID 4 MG/5ML IV CONC
2.0000 mg | Freq: Once | INTRAVENOUS | Status: AC
  Administered 2015-09-09: 2 mg via INTRAVENOUS
  Filled 2015-09-09: qty 2.5

## 2015-09-09 MED ORDER — SODIUM CHLORIDE 0.9 % IJ SOLN
10.0000 mL | INTRAMUSCULAR | Status: DC | PRN
  Administered 2015-09-09: 10 mL
  Filled 2015-09-09: qty 10

## 2015-09-09 MED ORDER — NIVOLUMAB CHEMO INJECTION 100 MG/10ML
3.0000 mg/kg | Freq: Once | INTRAVENOUS | Status: AC
  Administered 2015-09-09: 110 mg via INTRAVENOUS
  Filled 2015-09-09: qty 1

## 2015-09-09 MED ORDER — HEPARIN SOD (PORK) LOCK FLUSH 100 UNIT/ML IV SOLN
500.0000 [IU] | Freq: Once | INTRAVENOUS | Status: AC | PRN
Start: 2015-09-09 — End: 2015-09-09
  Administered 2015-09-09: 500 [IU]
  Filled 2015-09-09: qty 5

## 2015-09-09 MED ORDER — SODIUM CHLORIDE 0.9 % IV SOLN
Freq: Once | INTRAVENOUS | Status: AC
  Administered 2015-09-09: 11:00:00 via INTRAVENOUS

## 2015-09-09 NOTE — Patient Instructions (Signed)
[facility] Discharge Instructions for Patients Receiving Chemotherapy  Today you received the following chemotherapy agent: Opdivo.  You also received Zometa.       If you develop nausea and vomiting, or diarrhea that is not controlled by your medication, call the clinic.  The clinic phone number is [phone]. Office hours are Monday-Friday 8:30am-5:00pm.  BELOW ARE SYMPTOMS THAT SHOULD BE REPORTED IMMEDIATELY:  *FEVER GREATER THAN 101.0 F  *CHILLS WITH OR WITHOUT FEVER  NAUSEA AND VOMITING THAT IS NOT CONTROLLED WITH YOUR NAUSEA MEDICATION  *UNUSUAL SHORTNESS OF BREATH  *UNUSUAL BRUISING OR BLEEDING  TENDERNESS IN MOUTH AND THROAT WITH OR WITHOUT PRESENCE OF ULCERS  *URINARY PROBLEMS  *BOWEL PROBLEMS  UNUSUAL RASH Items with * indicate a potential emergency and should be followed up as soon as possible. If you have an emergency after office hours please contact your primary care physician or go to the nearest emergency department.  Please call the clinic during office hours if you have any questions or concerns.   You may also contact the Patient Navigator at [phone] should you have any questions or need assistance in obtaining follow up care. _____________________________________________________________________ Have you asked about our STAR program?    STAR stands for Survivorship Training and Rehabilitation, and this is a nationally recognized cancer care program that focuses on survivorship and rehabilitation.  Cancer and cancer treatments may cause problems, such as, pain, making you feel tired and keeping you from doing the things that you need or want to do. Cancer rehabilitation can help. Our goal is to reduce these troubling effects and help you have the best quality of life possible.  You may receive a survey from a nurse that asks questions about your current state of health.  Based on the survey results, all eligible patients will be  referred to the [facility] program for an evaluation so we can better serve you! A frequently asked questions sheet is available upon request.

## 2015-09-09 NOTE — Progress Notes (Signed)
Please see other encounter for documentation.

## 2015-09-09 NOTE — Progress Notes (Signed)
Patient tolerated infusion well.  VSS post infusion.

## 2015-09-15 ENCOUNTER — Other Ambulatory Visit (HOSPITAL_COMMUNITY): Payer: Self-pay | Admitting: Hematology & Oncology

## 2015-09-21 NOTE — Progress Notes (Signed)
Melissa Neighbors, MD [address] [place] [address]  Cancer of upper lobe of right lung ([employer]) - Plan: Oxycodone HCl 10 MG TABS  CURRENT THERAPY: Nivolumab/zometa/Aranesp  INTERVAL HISTORY: Melissa Sullivan 59 y.o. 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     Cancer of upper lobe of right lung ([place])   07/28/2014 Imaging CT chest: Large R apical mass consistent with malignancy. This is destroying the R 2nd rib with extension into adjacent soft tissue. R hilar adenopathy with R 5cm adrenal metastatic lesion.   08/01/2014 Initial Biopsy Lung, needle/core biopsy(ies), right upper lobe - POORLY DIFFERENTIATED ADENOCARCINOMA, SEE COMMENT.   08/08/2014 PET scan Large hypermetabolic R apical mass with evidence of direct chest wall and mediastinal invasion, right retrocrural lymphadenopathy, extensive retroperitoneal lymphadenopathy, and metastatic lesions to the adrenal glands    09/02/2014 - 11/04/2014 Chemotherapy Cisplatin/Pemetrexed/Avastin every 21 days x 4 cycles   10/07/2014 - 10/27/2014 Radiation Therapy Right lung apex for control of brachioplexopathy.   12/24/2014 - 02/25/2015 Chemotherapy Alimta/Avastin every 21 days.   02/20/2015 Imaging Increase in size of right adrenal metastasis and subjacent confluent retrocaval lymphadenopathy   02/25/2015 -  Chemotherapy Nivolumab, zometa   05/04/2015 Imaging CT CAP- Stable to slight decrease in the posterior right apical lesion. Stable appearance of posterior right upper rib an upper thoracic bony lesions. Slight improvement in right upper lobe tree-in-bud opacity. No new or progressive findings in...   07/28/2015 Imaging CT CAP- Reduced size of the right apical pleural parenchymal lesion and reduced size of the right adrenal metastatic lesion. Resolution of prior retrocrural adenopathy.  Right eccentric T1 and T2 sclerosis with  sclerosis and tapering of the right second..    I personally reviewed and went over laboratory results with the patient.  The results are noted within this dictation.  They are being updated today prior to immunotherapy administration.  She is doing well.  She notes a continued cough, but clear sputum production.  No signs or symptoms of infection.   She looks well today.  Her weight is stable.  She continues on Megace.   Past Medical History  Diagnosis Date  . Hypertension   . Reflux   . Hyperlipidemia   . Lung mass 07/28/2014  . Adrenal mass, right ([place]) 07/28/2014  . Diabetes mellitus without complication ([place])   . Cancer [facility])     lung  right    has Adrenal mass, right ([place]); Cancer of upper lobe of right lung ([place]); ARF (acute renal failure) ([place]); Abnormal finding on imaging; Oropharyngeal dysphagia; Nausea and vomiting; Hypercalcemia; Anemia in neoplastic disease; Dysphagia; and Encounter for screening colonoscopy on her problem list.     has No Known Allergies.  Current Outpatient Prescriptions on File Prior to Visit  Medication Sig Dispense Refill  . Alum & Mag Hydroxide-Simeth (MAGIC MOUTHWASH W/LIDOCAINE) SOLN Take 5 mLs by mouth 4 (four) times daily as needed for mouth pain. 400 mL 4  . Fluticasone-Salmeterol (ADVAIR DISKUS) 500-50 MCG/DOSE AEPB Two puffs at the same time daily. 60 each 6  . folic acid (FOLVITE) 1 MG tablet Take 1 mg by mouth every morning.    . hydrochlorothiazide (MICROZIDE) 12.5 MG capsule Take 12.5 mg by mouth every morning.     Marland Kitchen HYDROcodone-homatropine (HYCODAN) 5-1.5 MG/5ML syrup Take 5 mLs by mouth every 6 (six) hours as needed for cough.  200 mL 0  . ibuprofen (ADVIL,MOTRIN) 200 MG tablet Take 200-800 mg by mouth once as needed for headache or mild pain.    . Iron-FA-B Cmp-C-Biot-Probiotic (FUSION PLUS) CAPS Take 1 capsule by mouth daily.     Marland Kitchen KLOR-CON M20 20 MEQ tablet TAKE ONE TABLET BY MOUTH TWICE DAILY (Patient taking differently: one tablet  daily) 60 tablet 0  . lidocaine-prilocaine (EMLA) cream Apply a quarter size amount to port site 1 hour prior to chemo. Do not rub in. Cover with plastic wrap. 30 g 3  . megestrol (MEGACE) 40 MG/ML suspension Take 400 mg by mouth daily.     . metFORMIN (GLUCOPHAGE) 500 MG tablet Take 1 tablet (500 mg total) by mouth 2 (two) times daily with a meal. 60 tablet 2  . metoCLOPramide (REGLAN) 10 MG tablet TAKE ONE TABLET BY MOUTH 4 TIMES DAILY 120 tablet 0  . nicotine (NICODERM CQ - DOSED IN MG/24 HOURS) 21 mg/24hr patch Place 1 patch onto the skin daily.  0  . Nivolumab (OPDIVO IV) Inject into the vein every 14 (fourteen) days.    Marland Kitchen omeprazole (PRILOSEC) 20 MG capsule Take 1 capsule (20 mg total) by mouth 2 (two) times daily before a meal. 60 capsule 11  . senna (SENOKOT) 8.6 MG tablet Take 2 tablets by mouth at bedtime.     . simvastatin (ZOCOR) 40 MG tablet Take 40 mg by mouth every morning.     . triamcinolone (KENALOG) 0.025 % cream Apply 1 application topically 2 (two) times daily.    Marland Kitchen azithromycin (ZITHROMAX) 250 MG tablet Take two tablets today and one tablet daily thereafter (Patient not taking: Reported on 09/23/2015) 11 each 0  . ondansetron (ZOFRAN) 8 MG tablet Take 1 tablet (8 mg total) by mouth every 8 (eight) hours as needed for nausea or vomiting. (Patient not taking: Reported on 09/23/2015) 60 tablet 1  . predniSONE (DELTASONE) 10 MG tablet Prednisone 21m (8 tabs) by mouth daily x 3 days, then 765m(7 tabs) x 3 days, 6023m6 tabs) x 3 days, 6m39m tabs) x 3 days, 40mg82mtabs) x 3 days, 30mg 34mabs) x 3 days, 20mg (32mbs) x 3 days, 10mg (172m) x 3 days, 5mg (1/291mb) x 3 days. Start @ 1st sign of diarrhea. (Patient not taking: Reported on 07/29/2015) 120 tablet 0  . prochlorperazine (COMPAZINE) 10 MG tablet Starting the day after chemo, take 1 tab four times a day x 2 days. Then may take 1 tab four times a day IF needed for nausea/vomiting. (Patient not taking: Reported on 08/26/2015) 60  tablet 2   No current facility-administered medications on file prior to visit.    Past Surgical History  Procedure Laterality Date  . Appendectomy    . Lung biopsy Right 07/2014    CT guided  . Portacath placement Left 09/01/2014  . Esophagogastroduodenoscopy N/A 12/09/2014    SLF:distaWJX:BJYNWGal stricture/mild-to-noderate erosive gastritis. negative H.pylori  . Savory dilation N/A 12/09/2014    Procedure: SAVORY DILATION;  Surgeon: Sandi L FDanie Bindercation: [facility] ENDO SUITE;  Service: Endoscopy;  Laterality: N/A;  . MaloneyVenia Minks N/A 12/09/2014    Procedure: MALONEY DVenia Minks;  Surgeon: Sandi L FDanie Bindercation: [facility] ENDO SUITE;  Service: Endoscopy;  Laterality: N/A;  . Flexible sigmoidoscopy  2011    Dr. Fields: h[place] Alarastic polyp    Denies any headaches, dizziness, double vision, fevers, chills, night sweats, nausea, vomiting, diarrhea, constipation, chest pain, heart  palpitations, shortness of breath, blood in stool, black tarry stool, urinary pain, urinary burning, urinary frequency, hematuria.   PHYSICAL EXAMINATION  ECOG PERFORMANCE STATUS: 1 - Symptomatic but completely ambulatory  Filed Vitals:   09/23/15 1029  BP: 125/78  Pulse: 89  Temp: 98.6 F (37 C)  Resp: 18    GENERAL:alert, no distress, cachectic, comfortable, cooperative, smiling and accompanied by daughter. SKIN: skin color, texture, turgor are normal, no rashes or significant lesions HEAD: Normocephalic, No masses, lesions, tenderness or abnormalities EYES: normal, PERRLA, EOMI, Conjunctiva are pink and non-injected EARS: External ears normal OROPHARYNX:lips, buccal mucosa, and tongue normal and mucous membranes are moist  NECK: supple, no adenopathy, thyroid normal size, non-tender, without nodularity, no stridor, non-tender, trachea midline LYMPH:  no palpable lymphadenopathy, no hepatosplenomegaly BREAST:not examined LUNGS: clear to auscultation and percussion HEART: regular rate & rhythm, no  murmurs, no gallops, S1 normal and S2 normal ABDOMEN:abdomen soft, non-tender, normal bowel sounds and no masses or organomegaly BACK: Back symmetric, no curvature., No CVA tenderness EXTREMITIES:less then 2 second capillary refill, no joint deformities, effusion, or inflammation, no edema, no skin discoloration, no cyanosis  NEURO: alert & oriented x 3 with fluent speech, no focal motor/sensory deficits, gait normal    LABORATORY DATA: CBC    Component Value Date/Time   WBC 6.6 09/09/2015 0952   RBC 3.41* 09/09/2015 0952   RBC 2.45* 01/15/2015 0901   HGB 10.7* 09/09/2015 0952   HCT 34.0* 09/09/2015 0952   PLT 270 09/09/2015 0952   MCV 99.7 09/09/2015 0952   MCH 31.4 09/09/2015 0952   MCHC 31.5 09/09/2015 0952   RDW 20.4* 09/09/2015 0952   LYMPHSABS 1.7 09/09/2015 0952   MONOABS 0.6 09/09/2015 0952   EOSABS 0.1 09/09/2015 0952   BASOSABS 0.0 09/09/2015 0952      Chemistry      Component Value Date/Time   NA 136 09/09/2015 0952   K 4.1 09/09/2015 0952   CL 107 09/09/2015 0952   CO2 21* 09/09/2015 0952   BUN 12 09/09/2015 0952   CREATININE 1.52* 09/09/2015 0952      Component Value Date/Time   CALCIUM 11.1* 09/09/2015 0952   CALCIUM 9.7 06/03/2015 1000   ALKPHOS 52 09/09/2015 0952   AST 17 09/09/2015 0952   ALT 9* 09/09/2015 0952   BILITOT 0.3 09/09/2015 0952     Lab Results  Component Value Date   TSH 1.401 09/09/2015     PENDING LABS:   RADIOGRAPHIC STUDIES:  No results found.   PATHOLOGY:    ASSESSMENT AND PLAN:  Cancer of upper lobe of right lung Stage IV right adenocarcinoma pancoast tumor of the lung (EGFR and ALK negative) complicated by right brachial plexus neuralgia.  S/P XRT.  Currently on Nivolumab salvage therapy with Zometa for Hypercalcemia.  Due for treatment today. Pre-chemo labs are pending today.  TSH has been monitored and WNL most recently.  She continues with Nicotine patches.  She is down to smoking 2 cigarettes daily  (compared to 5 daily in the past).  Smoking cessation encouraged.  Return in 4 weeks for follow-up with continuation of pre-chemo labs and treatment every 2 weeks.  Repeat imaging in 8-12 weeks would be reasonable and dependent on clinical response.    THERAPY PLAN:  Continue treatment as planned.  All questions were answered. The patient knows to call the clinic with any problems, questions or concerns. We can certainly see the patient much sooner if necessary.  Patient and plan discussed with  Dr. Ancil Linsey and she is in agreement with the aforementioned.   This note is electronically signed by: Doy Mince 09/23/2015 11:20 AM

## 2015-09-21 NOTE — Assessment & Plan Note (Addendum)
Stage IV right adenocarcinoma pancoast tumor of the lung (EGFR and ALK negative) complicated by right brachial plexus neuralgia.  S/P XRT.  Currently on Nivolumab salvage therapy with Zometa for Hypercalcemia.  Due for treatment today. Pre-chemo labs are pending today.  TSH has been monitored and WNL most recently.  She continues with Nicotine patches.  She is down to smoking 2 cigarettes daily (compared to 5 daily in the past).  Smoking cessation encouraged.  Return in 4 weeks for follow-up with continuation of pre-chemo labs and treatment every 2 weeks.  Repeat imaging in 8-12 weeks would be reasonable and dependent on clinical response.  Addendum: Hypercalcemia noted.  She received 2 mg of Zometa 2 weeks ago.  Albumin is WNL.  I have asked nursing to provide additional fluids (500 cc at a rate of 500 cc/hr) today and 2 mg of Zometa this will be released from supportive therapy plan).

## 2015-09-23 ENCOUNTER — Encounter (HOSPITAL_COMMUNITY): Payer: Self-pay | Admitting: Oncology

## 2015-09-23 ENCOUNTER — Encounter (HOSPITAL_COMMUNITY): Payer: 59 | Attending: Hematology and Oncology | Admitting: Oncology

## 2015-09-23 ENCOUNTER — Encounter (HOSPITAL_BASED_OUTPATIENT_CLINIC_OR_DEPARTMENT_OTHER): Payer: 59

## 2015-09-23 VITALS — BP 125/78 | HR 89 | Temp 98.6°F | Resp 18 | Wt 86.0 lb

## 2015-09-23 VITALS — BP 99/59 | HR 85 | Temp 98.8°F | Resp 16

## 2015-09-23 DIAGNOSIS — C7951 Secondary malignant neoplasm of bone: Secondary | ICD-10-CM

## 2015-09-23 DIAGNOSIS — C7971 Secondary malignant neoplasm of right adrenal gland: Secondary | ICD-10-CM | POA: Diagnosis not present

## 2015-09-23 DIAGNOSIS — F17210 Nicotine dependence, cigarettes, uncomplicated: Secondary | ICD-10-CM | POA: Diagnosis not present

## 2015-09-23 DIAGNOSIS — C7972 Secondary malignant neoplasm of left adrenal gland: Secondary | ICD-10-CM | POA: Diagnosis not present

## 2015-09-23 DIAGNOSIS — K219 Gastro-esophageal reflux disease without esophagitis: Secondary | ICD-10-CM | POA: Diagnosis not present

## 2015-09-23 DIAGNOSIS — Z720 Tobacco use: Secondary | ICD-10-CM | POA: Diagnosis not present

## 2015-09-23 DIAGNOSIS — C3411 Malignant neoplasm of upper lobe, right bronchus or lung: Secondary | ICD-10-CM | POA: Diagnosis present

## 2015-09-23 DIAGNOSIS — R05 Cough: Secondary | ICD-10-CM

## 2015-09-23 DIAGNOSIS — I10 Essential (primary) hypertension: Secondary | ICD-10-CM | POA: Diagnosis not present

## 2015-09-23 DIAGNOSIS — J449 Chronic obstructive pulmonary disease, unspecified: Secondary | ICD-10-CM | POA: Insufficient documentation

## 2015-09-23 DIAGNOSIS — C772 Secondary and unspecified malignant neoplasm of intra-abdominal lymph nodes: Secondary | ICD-10-CM | POA: Insufficient documentation

## 2015-09-23 DIAGNOSIS — C781 Secondary malignant neoplasm of mediastinum: Secondary | ICD-10-CM | POA: Insufficient documentation

## 2015-09-23 DIAGNOSIS — E785 Hyperlipidemia, unspecified: Secondary | ICD-10-CM | POA: Insufficient documentation

## 2015-09-23 DIAGNOSIS — C7989 Secondary malignant neoplasm of other specified sites: Secondary | ICD-10-CM | POA: Diagnosis not present

## 2015-09-23 DIAGNOSIS — Z9089 Acquired absence of other organs: Secondary | ICD-10-CM | POA: Insufficient documentation

## 2015-09-23 DIAGNOSIS — E278 Other specified disorders of adrenal gland: Secondary | ICD-10-CM

## 2015-09-23 DIAGNOSIS — Z79899 Other long term (current) drug therapy: Secondary | ICD-10-CM | POA: Diagnosis not present

## 2015-09-23 DIAGNOSIS — Z5112 Encounter for antineoplastic immunotherapy: Secondary | ICD-10-CM | POA: Diagnosis not present

## 2015-09-23 LAB — CBC WITH DIFFERENTIAL/PLATELET
Basophils Absolute: 0 10*3/uL (ref 0.0–0.1)
Basophils Relative: 0 %
EOS PCT: 2 %
Eosinophils Absolute: 0.2 10*3/uL (ref 0.0–0.7)
HEMATOCRIT: 36.9 % (ref 36.0–46.0)
Hemoglobin: 11.8 g/dL — ABNORMAL LOW (ref 12.0–15.0)
LYMPHS ABS: 1.9 10*3/uL (ref 0.7–4.0)
Lymphocytes Relative: 25 %
MCH: 32.1 pg (ref 26.0–34.0)
MCHC: 32 g/dL (ref 30.0–36.0)
MCV: 100.3 fL — AB (ref 78.0–100.0)
MONO ABS: 0.5 10*3/uL (ref 0.1–1.0)
Monocytes Relative: 6 %
Neutro Abs: 5 10*3/uL (ref 1.7–7.7)
Neutrophils Relative %: 67 %
PLATELETS: 304 10*3/uL (ref 150–400)
RBC: 3.68 MIL/uL — AB (ref 3.87–5.11)
RDW: 16.4 % — AB (ref 11.5–15.5)
WBC: 7.5 10*3/uL (ref 4.0–10.5)

## 2015-09-23 LAB — COMPREHENSIVE METABOLIC PANEL
ALT: 10 U/L — AB (ref 14–54)
ANION GAP: 6 (ref 5–15)
AST: 19 U/L (ref 15–41)
Albumin: 4.1 g/dL (ref 3.5–5.0)
Alkaline Phosphatase: 52 U/L (ref 38–126)
BILIRUBIN TOTAL: 0.5 mg/dL (ref 0.3–1.2)
BUN: 14 mg/dL (ref 6–20)
CHLORIDE: 104 mmol/L (ref 101–111)
CO2: 25 mmol/L (ref 22–32)
CREATININE: 1.35 mg/dL — AB (ref 0.44–1.00)
Calcium: 10.4 mg/dL — ABNORMAL HIGH (ref 8.9–10.3)
GFR, EST AFRICAN AMERICAN: 49 mL/min — AB (ref >60)
GFR, EST NON AFRICAN AMERICAN: 42 mL/min — AB (ref >60)
Glucose, Bld: 81 mg/dL (ref 65–99)
POTASSIUM: 4.3 mmol/L (ref 3.5–5.1)
Sodium: 135 mmol/L (ref 135–145)
Total Protein: 8 g/dL (ref 6.5–8.1)

## 2015-09-23 MED ORDER — SODIUM CHLORIDE 0.9 % IJ SOLN: 10.0000 mL | INTRAMUSCULAR | Status: DC | PRN

## 2015-09-23 MED ORDER — SODIUM CHLORIDE 0.9 % IV SOLN
Freq: Once | INTRAVENOUS | Status: AC
  Administered 2015-09-23: 12:00:00 via INTRAVENOUS

## 2015-09-23 MED ORDER — SODIUM CHLORIDE 0.9 % IV SOLN
3.0000 mg/kg | Freq: Once | INTRAVENOUS | Status: AC
  Administered 2015-09-23: 110 mg via INTRAVENOUS
  Filled 2015-09-23: qty 1

## 2015-09-23 MED ORDER — ZOLEDRONIC ACID 4 MG/5ML IV CONC
2.0000 mg | Freq: Once | INTRAVENOUS | Status: AC
  Administered 2015-09-23: 2 mg via INTRAVENOUS
  Filled 2015-09-23: qty 2.5

## 2015-09-23 MED ORDER — OXYCODONE HCL 10 MG PO TABS: 10.0000 mg | ORAL_TABLET | Freq: Four times a day (QID) | ORAL | Status: DC | PRN

## 2015-09-23 MED ORDER — HEPARIN SOD (PORK) LOCK FLUSH 100 UNIT/ML IV SOLN
500.0000 [IU] | Freq: Once | INTRAVENOUS | Status: AC | PRN
  Administered 2015-09-23: 500 [IU]
  Filled 2015-09-23: qty 5

## 2015-09-23 NOTE — Patient Instructions (Signed)
[place] at [facility] Discharge Instructions  RECOMMENDATIONS MADE BY THE CONSULTANT AND ANY TEST RESULTS WILL BE SENT TO YOUR REFERRING PHYSICIAN.  OPdivo today 557m fluid given over an hour zometa given today due to increased calcium level Return as scheduled Please call the clinic if you have any questions or concerns  [facility] [place].  To afford each patient quality time with our provider, please arrive at least 15 minutes before your scheduled appointment time.    You need to re-schedule your appointment should you arrive 10 or more minutes late.  We strive to give you quality time with our providers, and arriving late affects you and other patients whose appointments are after yours.  Also, if you no show three or more times for appointments you may be dismissed from the clinic at the providers discretion.     Again, [facility]  Our hope is that these requests will decrease the amount of time that you wait before being seen by our physicians.       _____________________________________________________________  [facility] have [facility] please contact our office at [phone] between the hours of 8:30 a.m. and 4:30 p.m.  Voicemails left after 4:30 p.m. will not be returned until the following business day.  For prescription refill requests, have your pharmacy contact our office.

## 2015-09-23 NOTE — Patient Instructions (Signed)
[place] at [facility] Discharge Instructions  RECOMMENDATIONS MADE BY THE CONSULTANT AND ANY TEST RESULTS WILL BE SENT TO YOUR REFERRING PHYSICIAN.  Return in 4 weeks for follow up with the doctor. Treatment every 2 weeks.    Thank you for choosing [place] at [facility] to provide your oncology and hematology care.  To afford each patient quality time with our provider, please arrive at least 15 minutes before your scheduled appointment time.    You need to re-schedule your appointment should you arrive 10 or more minutes late.  We strive to give you quality time with our providers, and arriving late affects you and other patients whose appointments are after yours.  Also, if you no show three or more times for appointments you may be dismissed from the clinic at the providers discretion.     Again, thank you for choosing [facility].  Our hope is that these requests will decrease the amount of time that you wait before being seen by our physicians.       _____________________________________________________________  Should you have questions after your visit to [facility], please contact our office at [phone] between the hours of 8:30 a.m. and 4:30 p.m.  Voicemails left after 4:30 p.m. will not be returned until the following business day.  For prescription refill requests, have your pharmacy contact our office.

## 2015-09-23 NOTE — Progress Notes (Signed)
zometa given today due to increased calcium level 500 mL of saline given over an hour Leonard Downing Tolerated chemotherapy well today Discharged ambulatory

## 2015-09-23 NOTE — Addendum Note (Signed)
Addended by: Baird Cancer on: 09/23/2015 12:10 PM   Modules accepted: Level of Service

## 2015-09-29 ENCOUNTER — Encounter: Payer: Self-pay | Admitting: *Deleted

## 2015-09-29 NOTE — Progress Notes (Signed)
Melissa Sullivan Clinical Social Work  Clinical Social Work was referred by patient and daughter for assessment of psychosocial needs due to nutritional concerns and transportation needs. Clinical Social Worker met with patient and her daughter today at [facility] to offer support and assess for needs.  Per pt, she reports she was told to drink 3-4 Ensure's a day and she has run through all of her free cases. Pt cannot afford to follow this plan due to her finances. CSW has not had success exploring other avenues for assistance. CSW phoned Melissa Sullivan, RD and he will follow up with pt on some other ways to try to meet her nutrition needs. CSW also encouraged pt to reach out to [place], [place] and her local DSS to further explore resources for this. CSW will also research this issue further. CSW did meet with pt to complete [place] form today and will submit accordingly.   Clinical Social Work interventions: Resource assistance Referrals   Melissa Sullivan, [place] Tuesdays   [phone]

## 2015-10-02 ENCOUNTER — Encounter: Payer: Self-pay | Admitting: Dietician

## 2015-10-02 NOTE — Progress Notes (Signed)
Reportedly, pt has been relying on Ensure for her nutrition. She was advised to drink 3-4 each day, but lacks the resources to continue to do this. She has applied to various assistance programs with no success.   Contacted Pt by Phone  Wt Readings from Last 10 Encounters:  09/23/15 86 lb (39.009 kg)  09/09/15 87 lb 12.8 oz (39.826 kg)  08/26/15 85 lb 11.2 oz (38.873 kg)  08/12/15 88 lb 12.8 oz (40.279 kg)  07/29/15 88 lb (39.917 kg)  07/15/15 85 lb 12.8 oz (38.919 kg)  07/10/15 85 lb 9.6 oz (38.828 kg)  07/01/15 86 lb (39.009 kg)  06/17/15 82 lb 3.2 oz (37.286 kg)  06/03/15 82 lb 9.6 oz (37.467 kg)   Patient weight has remained stable the past 3 months.   Upon calling, the pt was not available. Spoke with her daughter.   Daughter reports that patient does not rely on Ensure for her nutrition. "She eats regular food too". She eats a Breakfast (eggs/sausage/baon), a late morning snack, and then a dinner. When asked what other foods the pt eats she listed sausage, eggs, cheese, vegetables, fruit, mayo, pinto beans, mac and cheese, whole milk, peanut butter, jelly as well as a few others. From the long list given it does not sound like the pt has trouble tolerating any type of foods.   In addition to the 2 meals and 1 snack she drinks 3 ensure supplements. Went over how to make a "Home made Ensure" by fortifying whole milk with powdered milk. Advised to add choc syrup, fruits, etc for taste. This formula has 60% of the calories, but over 108% of the protein as a regular Ensure. The calorie deficit could easily be made up by adding fruit/syrups to the milk. Will send handout with the recipe.   Per report, pts oral intake appears to be good and she does not sound to be suffering from any side effects/symptoms.   Pt is already doing much of what is typically reccomended to increase kcal/pro. She is eating/drinking snacks 6 times daily, is consuming high pro/kcal foods and is adding additional  calories/protein to all the foods she eats.   Mailed coupons and handouts titled "Soft and Moist High Protein Menu Ideas" and "Makign the Most of each bite"  [name] RD, LDN Nutrition Pager: [phone] 10/02/2015 2:31 PM

## 2015-10-05 ENCOUNTER — Telehealth (HOSPITAL_COMMUNITY): Payer: Self-pay | Admitting: Hematology & Oncology

## 2015-10-05 ENCOUNTER — Encounter: Payer: Self-pay | Admitting: Gastroenterology

## 2015-10-05 NOTE — Telephone Encounter (Signed)
Pc to [employer]  [phone] just to verify that pts chemo and zometa did NOT require auth. Per April auth is not required for [place] or [id number]. Call [phone] [facility] [phone]

## 2015-10-06 ENCOUNTER — Encounter: Payer: Self-pay | Admitting: *Deleted

## 2015-10-06 NOTE — Progress Notes (Signed)
[place] Clinical Social Work  Clinical Social Work phoned pt to discuss with her the need to sign consent form for [employer] card assistance program form when she comes to [facility] on 10/07/15. Pt stated understanding and will follow up on 10/07/15.  Clinical Social Work interventions: Resource education/coordination  [name], [place] Tuesdays   [phone]

## 2015-10-07 ENCOUNTER — Encounter (HOSPITAL_BASED_OUTPATIENT_CLINIC_OR_DEPARTMENT_OTHER): Payer: 59

## 2015-10-07 ENCOUNTER — Encounter (HOSPITAL_COMMUNITY): Payer: 59

## 2015-10-07 ENCOUNTER — Encounter: Payer: Self-pay | Admitting: *Deleted

## 2015-10-07 ENCOUNTER — Encounter (HOSPITAL_COMMUNITY): Payer: Self-pay

## 2015-10-07 VITALS — BP 101/67 | HR 83 | Temp 98.0°F | Resp 18 | Wt 89.0 lb

## 2015-10-07 DIAGNOSIS — Z23 Encounter for immunization: Secondary | ICD-10-CM | POA: Diagnosis not present

## 2015-10-07 DIAGNOSIS — E278 Other specified disorders of adrenal gland: Secondary | ICD-10-CM

## 2015-10-07 DIAGNOSIS — Z5112 Encounter for antineoplastic immunotherapy: Secondary | ICD-10-CM

## 2015-10-07 DIAGNOSIS — C7971 Secondary malignant neoplasm of right adrenal gland: Secondary | ICD-10-CM | POA: Diagnosis not present

## 2015-10-07 DIAGNOSIS — C7972 Secondary malignant neoplasm of left adrenal gland: Secondary | ICD-10-CM

## 2015-10-07 DIAGNOSIS — C7951 Secondary malignant neoplasm of bone: Secondary | ICD-10-CM

## 2015-10-07 DIAGNOSIS — C3411 Malignant neoplasm of upper lobe, right bronchus or lung: Secondary | ICD-10-CM

## 2015-10-07 LAB — CBC WITH DIFFERENTIAL/PLATELET
BASOS PCT: 0 %
Basophils Absolute: 0 10*3/uL (ref 0.0–0.1)
EOS ABS: 0.1 10*3/uL (ref 0.0–0.7)
Eosinophils Relative: 2 %
HEMATOCRIT: 35.7 % — AB (ref 36.0–46.0)
HEMOGLOBIN: 11.3 g/dL — AB (ref 12.0–15.0)
Lymphocytes Relative: 25 %
Lymphs Abs: 2 10*3/uL (ref 0.7–4.0)
MCH: 31.6 pg (ref 26.0–34.0)
MCHC: 31.7 g/dL (ref 30.0–36.0)
MCV: 99.7 fL (ref 78.0–100.0)
Monocytes Absolute: 0.6 10*3/uL (ref 0.1–1.0)
Monocytes Relative: 8 %
NEUTROS ABS: 5.2 10*3/uL (ref 1.7–7.7)
NEUTROS PCT: 65 %
Platelets: 246 10*3/uL (ref 150–400)
RBC: 3.58 MIL/uL — AB (ref 3.87–5.11)
RDW: 14.9 % (ref 11.5–15.5)
WBC: 8 10*3/uL (ref 4.0–10.5)

## 2015-10-07 LAB — COMPREHENSIVE METABOLIC PANEL
ALBUMIN: 3.9 g/dL (ref 3.5–5.0)
ALK PHOS: 52 U/L (ref 38–126)
ALT: 9 U/L — AB (ref 14–54)
ANION GAP: 8 (ref 5–15)
AST: 19 U/L (ref 15–41)
BILIRUBIN TOTAL: 0.4 mg/dL (ref 0.3–1.2)
BUN: 15 mg/dL (ref 6–20)
CALCIUM: 10.2 mg/dL (ref 8.9–10.3)
CO2: 22 mmol/L (ref 22–32)
CREATININE: 1.3 mg/dL — AB (ref 0.44–1.00)
Chloride: 104 mmol/L (ref 101–111)
GFR calc Af Amer: 51 mL/min — ABNORMAL LOW (ref >60)
GFR calc non Af Amer: 44 mL/min — ABNORMAL LOW (ref >60)
GLUCOSE: 100 mg/dL — AB (ref 65–99)
Potassium: 3.8 mmol/L (ref 3.5–5.1)
SODIUM: 134 mmol/L — AB (ref 135–145)
TOTAL PROTEIN: 7.4 g/dL (ref 6.5–8.1)

## 2015-10-07 LAB — TSH: TSH: 1.98 u[IU]/mL (ref 0.350–4.500)

## 2015-10-07 MED ORDER — SODIUM CHLORIDE 0.9 % IV SOLN
240.0000 mg | Freq: Once | INTRAVENOUS | Status: AC
  Administered 2015-10-07: 240 mg via INTRAVENOUS
  Filled 2015-10-07: qty 8

## 2015-10-07 MED ORDER — SODIUM CHLORIDE 0.9 % IV SOLN
Freq: Once | INTRAVENOUS | Status: AC
  Administered 2015-10-07: 12:00:00 via INTRAVENOUS

## 2015-10-07 MED ORDER — SODIUM CHLORIDE 0.9 % IJ SOLN
10.0000 mL | INTRAMUSCULAR | Status: DC | PRN
  Administered 2015-10-07: 10 mL
  Filled 2015-10-07: qty 10

## 2015-10-07 MED ORDER — HEPARIN SOD (PORK) LOCK FLUSH 100 UNIT/ML IV SOLN
500.0000 [IU] | Freq: Once | INTRAVENOUS | Status: AC | PRN
  Administered 2015-10-07: 500 [IU]

## 2015-10-07 MED ORDER — HEPARIN SOD (PORK) LOCK FLUSH 100 UNIT/ML IV SOLN
INTRAVENOUS | Status: AC
  Filled 2015-10-07: qty 5

## 2015-10-07 MED ORDER — INFLUENZA VAC SPLIT QUAD 0.5 ML IM SUSY
0.5000 mL | PREFILLED_SYRINGE | Freq: Once | INTRAMUSCULAR | Status: AC
  Administered 2015-10-07: 0.5 mL via INTRAMUSCULAR
  Filled 2015-10-07: qty 0.5

## 2015-10-07 NOTE — Patient Instructions (Signed)
[facility] Discharge Instructions for Patients Receiving Chemotherapy  Today you received the following chemotherapy agents:  Opdivo If you develop nausea and vomiting, or diarrhea that is not controlled by your medication, call the clinic.  The clinic phone number is [phone]. Office hours are Monday-Friday 8:30am-5:00pm.  BELOW ARE SYMPTOMS THAT SHOULD BE REPORTED IMMEDIATELY:  *FEVER GREATER THAN 101.0 F  *CHILLS WITH OR WITHOUT FEVER  NAUSEA AND VOMITING THAT IS NOT CONTROLLED WITH YOUR NAUSEA MEDICATION  *UNUSUAL SHORTNESS OF BREATH  *UNUSUAL BRUISING OR BLEEDING  TENDERNESS IN MOUTH AND THROAT WITH OR WITHOUT PRESENCE OF ULCERS  *URINARY PROBLEMS  *BOWEL PROBLEMS  UNUSUAL RASH Items with * indicate a potential emergency and should be followed up as soon as possible. If you have an emergency after office hours please contact your primary care physician or go to the nearest emergency department.  Please call the clinic during office hours if you have any questions or concerns.   You may also contact the Patient Navigator at [phone] should you have any questions or need assistance in obtaining follow up care.

## 2015-10-07 NOTE — Progress Notes (Signed)
1200:  Lab results printed and reviewed with T. Kefalas, PA-C - okay to treat today per PA.   1415:  Tolerated infusion w/o adverse reaction.  A&Ox4; VSS.  Discharged ambulatory.

## 2015-10-07 NOTE — Progress Notes (Signed)
[place] Clinical Social Work  Clinical Social Work submitted forms to [employer] for gas card request as discussed with pt previously. Pt aware several weeks expected for response from [employer]. CSW to continue to follow as needs arise.    Clinical Social Work interventions: Resource assistance  [name], [place] Tuesdays   [phone]

## 2015-10-07 NOTE — Progress Notes (Signed)
See chemo encounter.

## 2015-10-13 ENCOUNTER — Other Ambulatory Visit (HOSPITAL_COMMUNITY): Payer: Self-pay | Admitting: Hematology & Oncology

## 2015-10-13 ENCOUNTER — Other Ambulatory Visit (HOSPITAL_COMMUNITY): Payer: Self-pay | Admitting: Oncology

## 2015-10-14 ENCOUNTER — Other Ambulatory Visit (HOSPITAL_COMMUNITY): Payer: Self-pay

## 2015-10-14 DIAGNOSIS — C3411 Malignant neoplasm of upper lobe, right bronchus or lung: Secondary | ICD-10-CM

## 2015-10-14 MED ORDER — LIDOCAINE-PRILOCAINE 2.5-2.5 % EX CREA: TOPICAL_CREAM | CUTANEOUS | Status: DC

## 2015-10-21 ENCOUNTER — Encounter (HOSPITAL_COMMUNITY): Payer: 59

## 2015-10-21 ENCOUNTER — Encounter (HOSPITAL_BASED_OUTPATIENT_CLINIC_OR_DEPARTMENT_OTHER): Payer: 59

## 2015-10-21 ENCOUNTER — Encounter (HOSPITAL_COMMUNITY): Payer: Self-pay | Admitting: Hematology & Oncology

## 2015-10-21 ENCOUNTER — Encounter (HOSPITAL_BASED_OUTPATIENT_CLINIC_OR_DEPARTMENT_OTHER): Payer: 59 | Admitting: Hematology & Oncology

## 2015-10-21 VITALS — BP 92/60 | HR 86 | Temp 98.3°F | Resp 16 | Wt 88.0 lb

## 2015-10-21 DIAGNOSIS — E278 Other specified disorders of adrenal gland: Secondary | ICD-10-CM

## 2015-10-21 DIAGNOSIS — C3411 Malignant neoplasm of upper lobe, right bronchus or lung: Secondary | ICD-10-CM | POA: Diagnosis not present

## 2015-10-21 DIAGNOSIS — C7951 Secondary malignant neoplasm of bone: Secondary | ICD-10-CM

## 2015-10-21 DIAGNOSIS — C7971 Secondary malignant neoplasm of right adrenal gland: Secondary | ICD-10-CM

## 2015-10-21 DIAGNOSIS — Z5111 Encounter for antineoplastic chemotherapy: Secondary | ICD-10-CM | POA: Diagnosis not present

## 2015-10-21 DIAGNOSIS — Z720 Tobacco use: Secondary | ICD-10-CM | POA: Diagnosis not present

## 2015-10-21 DIAGNOSIS — C7972 Secondary malignant neoplasm of left adrenal gland: Secondary | ICD-10-CM | POA: Diagnosis not present

## 2015-10-21 LAB — COMPREHENSIVE METABOLIC PANEL
ALT: 10 U/L — AB (ref 14–54)
AST: 18 U/L (ref 15–41)
Albumin: 4 g/dL (ref 3.5–5.0)
Alkaline Phosphatase: 53 U/L (ref 38–126)
Anion gap: 9 (ref 5–15)
BILIRUBIN TOTAL: 0.3 mg/dL (ref 0.3–1.2)
BUN: 18 mg/dL (ref 6–20)
CALCIUM: 10 mg/dL (ref 8.9–10.3)
CHLORIDE: 102 mmol/L (ref 101–111)
CO2: 24 mmol/L (ref 22–32)
CREATININE: 1.52 mg/dL — AB (ref 0.44–1.00)
GFR, EST AFRICAN AMERICAN: 42 mL/min — AB (ref >60)
GFR, EST NON AFRICAN AMERICAN: 36 mL/min — AB (ref >60)
Glucose, Bld: 78 mg/dL (ref 65–99)
Potassium: 4.3 mmol/L (ref 3.5–5.1)
Sodium: 135 mmol/L (ref 135–145)
TOTAL PROTEIN: 7.9 g/dL (ref 6.5–8.1)

## 2015-10-21 LAB — CBC WITH DIFFERENTIAL/PLATELET
BASOS ABS: 0 10*3/uL (ref 0.0–0.1)
BASOS PCT: 0 %
EOS ABS: 0.1 10*3/uL (ref 0.0–0.7)
Eosinophils Relative: 2 %
HEMATOCRIT: 36.8 % (ref 36.0–46.0)
Hemoglobin: 12.1 g/dL (ref 12.0–15.0)
Lymphocytes Relative: 31 %
Lymphs Abs: 2.2 10*3/uL (ref 0.7–4.0)
MCH: 32.1 pg (ref 26.0–34.0)
MCHC: 32.9 g/dL (ref 30.0–36.0)
MCV: 97.6 fL (ref 78.0–100.0)
MONO ABS: 0.5 10*3/uL (ref 0.1–1.0)
Monocytes Relative: 7 %
NEUTROS ABS: 4.3 10*3/uL (ref 1.7–7.7)
Neutrophils Relative %: 60 %
PLATELETS: 255 10*3/uL (ref 150–400)
RBC: 3.77 MIL/uL — ABNORMAL LOW (ref 3.87–5.11)
RDW: 15.1 % (ref 11.5–15.5)
WBC: 7.1 10*3/uL (ref 4.0–10.5)

## 2015-10-21 MED ORDER — HEPARIN SOD (PORK) LOCK FLUSH 100 UNIT/ML IV SOLN
INTRAVENOUS | Status: AC
  Filled 2015-10-21: qty 5

## 2015-10-21 MED ORDER — SODIUM CHLORIDE 0.9 % IJ SOLN
10.0000 mL | INTRAMUSCULAR | Status: DC | PRN
  Administered 2015-10-21: 10 mL
  Filled 2015-10-21: qty 10

## 2015-10-21 MED ORDER — SODIUM CHLORIDE 0.9 % IV SOLN: Freq: Once | INTRAVENOUS | Status: DC

## 2015-10-21 MED ORDER — ZOLEDRONIC ACID 4 MG/5ML IV CONC
3.0000 mg | Freq: Once | INTRAVENOUS | Status: AC
  Administered 2015-10-21: 3 mg via INTRAVENOUS
  Filled 2015-10-21: qty 3.75

## 2015-10-21 MED ORDER — HEPARIN SOD (PORK) LOCK FLUSH 100 UNIT/ML IV SOLN
500.0000 [IU] | Freq: Once | INTRAVENOUS | Status: AC | PRN
  Administered 2015-10-21: 500 [IU]

## 2015-10-21 MED ORDER — SODIUM CHLORIDE 0.9 % IV SOLN
Freq: Once | INTRAVENOUS | Status: AC
  Administered 2015-10-21: 12:00:00 via INTRAVENOUS

## 2015-10-21 MED ORDER — SODIUM CHLORIDE 0.9 % IV SOLN
240.0000 mg | Freq: Once | INTRAVENOUS | Status: AC
  Administered 2015-10-21: 240 mg via INTRAVENOUS
  Filled 2015-10-21: qty 20

## 2015-10-21 NOTE — Patient Instructions (Signed)
[place] at [facility] Discharge Instructions  RECOMMENDATIONS MADE BY THE CONSULTANT AND ANY TEST RESULTS WILL BE SENT TO YOUR REFERRING PHYSICIAN.    Exam completed by Dr Whitney Muse today Chemotherapy as scheduled today Chemotherapy every two weeks Zometa every 4 weeks CT scans in 3 weeks Return to see the doctor in 4 weeks. Please call the clinic if you have any questions or concerns.  Thank you for choosing [place] at [facility] to provide your oncology and hematology care.  To afford each patient quality time with our provider, please arrive at least 15 minutes before your scheduled appointment time.    You need to re-schedule your appointment should you arrive 10 or more minutes late.  We strive to give you quality time with our providers, and arriving late affects you and other patients whose appointments are after yours.  Also, if you no show three or more times for appointments you may be dismissed from the clinic at the providers discretion.     Again, thank you for choosing [facility].  Our hope is that these requests will decrease the amount of time that you wait before being seen by our physicians.       _____________________________________________________________  Should you have questions after your visit to [facility], please contact our office at [phone] between the hours of 8:30 a.m. and 4:30 p.m.  Voicemails left after 4:30 p.m. will not be returned until the following business day.  For prescription refill requests, have your pharmacy contact our office.

## 2015-10-21 NOTE — Progress Notes (Signed)
[name], stage IV, bilateral adrenal metastases, retroperitoneal lymph nodes  Biopsy on 08/01/2014 c/w poorly differentiated adenocarcinoma  XRT completed on 10/27/2014  Right Brachial plexus neuralgia  EGFR mutation negative ALK mutation negative  Extensive soft tissue thickening in the posterior nasopharynx with bilateral level IIa lymphadenopathy in the neck. The possibility of a second primary neoplasm in this region warrants consideration. Clinical correlation is recommended. See onc hx...  CURRENT THERAPY: Nivolumab/zometa/Aranesp  INTERVAL HISTORY: Melissa Sullivan 59 y.o. female returns for follow-up of stage IV adenocarcinoma of the lung. She is doing well.  She sleeps ok. She denies any pain. Her mood is good. She notes some days she is "too busy." She has no other major complaints.Last CT scans showed ongoing improvement in her disease..  She confirms enthusiastically that she's been doing very well with her treatment. She says "I love it."  [name] says she's been eating a little bit more. She comments that she might lose her appetite sometimes, but that she did gain weight.  When asked if she will cook for Christmas, she laughs and says "I can do that."  She says her mood is good, and this is confirmed by her general enthusiasm today. She has no complaints or concerns.   MEDICAL HISTORY: Past Medical History  Diagnosis Date  . Hypertension   . Reflux   . Hyperlipidemia   . Lung mass 07/28/2014  . Adrenal mass, right ([place]) 07/28/2014  . Diabetes mellitus without complication ([place])   . Cancer [facility])     lung  right    has Adrenal mass, right ([place]); Cancer of upper lobe of right lung ([place]); ARF (acute renal failure) ([place]); Abnormal finding on imaging; Oropharyngeal dysphagia; Nausea and vomiting; Hypercalcemia; Anemia in neoplastic disease; Dysphagia; and Encounter for screening colonoscopy on her problem list.      Cancer of upper lobe of right lung  ([place])   07/28/2014 Imaging CT chest: Large R apical mass consistent with malignancy. This is destroying the R 2nd rib with extension into adjacent soft tissue. R hilar adenopathy with R 5cm adrenal metastatic lesion.   08/01/2014 Initial Biopsy Lung, needle/core biopsy(ies), right upper lobe - POORLY DIFFERENTIATED ADENOCARCINOMA, SEE COMMENT.   08/08/2014 PET scan Large hypermetabolic R apical mass with evidence of direct chest wall and mediastinal invasion, right retrocrural lymphadenopathy, extensive retroperitoneal lymphadenopathy, and metastatic lesions to the adrenal glands    09/02/2014 - 11/04/2014 Chemotherapy Cisplatin/Pemetrexed/Avastin every 21 days x 4 cycles   10/07/2014 - 10/27/2014 Radiation Therapy Right lung apex for control of brachioplexopathy.   12/24/2014 - 02/25/2015 Chemotherapy Alimta/Avastin every 21 days.   02/20/2015 Imaging Increase in size of right adrenal metastasis and subjacent confluent retrocaval lymphadenopathy   02/25/2015 -  Chemotherapy Nivolumab, zometa   05/04/2015 Imaging CT CAP- Stable to slight decrease in the posterior right apical lesion. Stable appearance of posterior right upper rib an upper thoracic bony lesions. Slight improvement in right upper lobe tree-in-bud opacity. No new or progressive findings in...   07/28/2015 Imaging CT CAP- Reduced size of the right apical pleural parenchymal lesion and reduced size of the right adrenal metastatic lesion. Resolution of prior retrocrural adenopathy.  Right eccentric T1 and T2 sclerosis with sclerosis and tapering of the right second..     has No Known Allergies.  [name] does not currently have medications on file.  SURGICAL HISTORY: Past Surgical History  Procedure Laterality Date  . Appendectomy    . Lung biopsy Right  07/2014    CT guided  . Portacath placement Left 09/01/2014  . Esophagogastroduodenoscopy N/A 12/09/2014    YNW:GNFAOZ esophageal stricture/mild-to-noderate erosive gastritis. negative H.pylori    . Savory dilation N/A 12/09/2014    Procedure: SAVORY DILATION;  Surgeon: [name], MD;  [facility];  Service: Endoscopy;  Laterality: N/A;  [name] dilation N/A 12/09/2014    Procedure: [name] DILATION;  Surgeon: [name], MD;  Location: [facility];  Service: Endoscopy;  Laterality: N/A;  . Flexible sigmoidoscopy  2011    Dr. [name]: hyperplastic polyp    SOCIAL HISTORY: Social History   Social History  . Marital Status: Married    Spouse Name: N/A  . Number of Children: N/A  . Years of Education: N/A   Occupational History  . Not on file.   Social History Main Topics  . Smoking status: Light Tobacco Smoker -- 0.30 packs/day    Types: Cigarettes  . Smokeless tobacco: Never Used  . Alcohol Use: No  . Drug Use: No  . Sexual Activity: Not on file   Other Topics Concern  . Not on file   Social History Narrative    FAMILY HISTORY: Family History  Problem Relation Age of Onset  . Cancer Sister     Review of Systems  Constitutional: Negative for fatigue. Negative for fever and chills.  HENT: Negative for congestion, hearing loss, nosebleeds, sore throat and tinnitus.   Eyes: Negative for blurred vision, double vision, pain and discharge.  Respiratory: Negative for cough, hemoptysis, sputum production, shortness of breath and wheezing.   Cardiovascular: Negative for chest pain, palpitations, claudication, leg swelling and PND.  Gastrointestinal: . Negative for heartburn, nausea, vomiting, diarrhea, constipation, blood in stool and melena.  Genitourinary: Negative for dysuria, urgency, frequency and hematuria.  Musculoskeletal: Negative for myalgias, joint pain and falls.  Skin: Negative for itching and rash.  Neurological: Negative for dizziness, tingling, tremors, sensory change, speech change, focal weakness, seizures, loss of consciousness, weakness and headaches.  Endo/Heme/Allergies: Does not bruise/bleed easily.   Psychiatric/Behavioral: Negative for depression, suicidal ideas, memory loss and substance abuse. The patient is not nervous/anxious and does not have insomnia.    14 point review of systems was performed and is negative except as detailed under history of present illness and above   PHYSICAL EXAMINATION  ECOG PERFORMANCE STATUS: 1 - Symptomatic but completely ambulatory  There were no vitals filed for this visit.  Physical Exam  Constitutional: She is oriented to person, place, and time.  HENT:  Head: Normocephalic and atraumatic.  Nose: Nose normal.  Mouth/Throat: Oropharynx is clear and moist. No oropharyngeal exudate.  Eyes: Conjunctivae and EOM are normal. Pupils are equal, round, and reactive to light. Right eye exhibits no discharge. Left eye exhibits no discharge. No scleral icterus.  Neck: Normal range of motion. Neck supple. No tracheal deviation present. No thyromegaly present.  Cardiovascular: Normal rate, regular rhythm and normal heart sounds.  Exam reveals no gallop and no friction rub.   No murmur heard. Pulmonary/Chest: Effort normal and breath sounds normal. She has no wheezes. She has no rales.  Abdominal: Soft. Bowel sounds are normal. She exhibits no distension and no mass. There is no tenderness. There is no rebound and no guarding.  Thin  Musculoskeletal: Normal range of motion. She exhibits no edema.  Lymphadenopathy:    She has no cervical adenopathy.  Neurological: She is alert and oriented to person, place, and time. She has normal reflexes.  No cranial nerve deficit. Gait normal. Coordination normal.  Skin: Skin is warm and dry. No rash noted.  Psychiatric: Mood, memory, affect and judgment normal.  Nursing note and vitals reviewed.   LABORATORY DATA: I have reviewed the data below as listed. CBC    Component Value Date/Time   WBC 7.1 10/21/2015 0907   RBC 3.77* 10/21/2015 0907   RBC 2.45* 01/15/2015 0901   HGB 12.1 10/21/2015 0907   HCT 36.8  10/21/2015 0907   PLT 255 10/21/2015 0907   MCV 97.6 10/21/2015 0907   MCH 32.1 10/21/2015 0907   MCHC 32.9 10/21/2015 0907   RDW 15.1 10/21/2015 0907   LYMPHSABS 2.2 10/21/2015 0907   MONOABS 0.5 10/21/2015 0907   EOSABS 0.1 10/21/2015 0907   BASOSABS 0.0 10/21/2015 0907   CMP     Component Value Date/Time   NA 135 10/21/2015 0907   K 4.3 10/21/2015 0907   CL 102 10/21/2015 0907   CO2 24 10/21/2015 0907   GLUCOSE 78 10/21/2015 0907   BUN 18 10/21/2015 0907   CREATININE 1.52* 10/21/2015 0907   CALCIUM 10.0 10/21/2015 0907   CALCIUM 9.7 06/03/2015 1000   PROT 7.9 10/21/2015 0907   ALBUMIN 4.0 10/21/2015 0907   AST 18 10/21/2015 0907   ALT 10* 10/21/2015 0907   ALKPHOS 53 10/21/2015 0907   BILITOT 0.3 10/21/2015 0907   GFRNONAA 36* 10/21/2015 0907   GFRAA 42* 10/21/2015 0907   RADIOLOGY: CLINICAL DATA: Restaging of non-small cell lung cancer the right upper lobe with adrenal metastatic disease. Chemotherapy and radiation therapy. Hypertension. Diabetes.  EXAM: CT CHEST, ABDOMEN, AND PELVIS WITH CONTRAST  TECHNIQUE: Multidetector CT imaging of the chest, abdomen and pelvis was performed following the standard protocol during bolus administration of intravenous contrast.  CONTRAST: 39m OMNIPAQUE IOHEXOL 300 MG/ML SOLN  COMPARISON: Multiple exams, including 05/04/2015  FINDINGS: CT CHEST FINDINGS  IMPRESSION: 1. Reduced size of the right apical pleural parenchymal lesion and reduced size of the right adrenal metastatic lesion. Resolution of prior retrocrural adenopathy. 2. Right eccentric T1 and T2 sclerosis with sclerosis and tapering of the right second rib compatible with prior malignant involvement -these bony findings are unchanged. 3. Other imaging findings of potential clinical significance: Mild wall thickening in the upper thoracic esophagus, possible esophagitis ; emphysema ; mild airway plugging in the lingula; aortoiliac  atherosclerosis.   Electronically Signed  By: WVan ClinesM.D.  On: 07/28/2015 10:22   ASSESSMENT and THERAPY PLAN:   Stage IV adenocarcinoma of the lung, EGFR and ALK negative  She is certainly doing well clinically. We will continue with current therapy. She has absolutely no major complaints today. Again reviewed the potential side effects of her current treatment.  She will be due for repeat imaging at the end of December and we will order these scans today.  Hypercalcemia/bony metastatic disease  She will continue with Zometa as prescribed.  Tobacco ABUSE  I re-emphasized the benefits of smoking cessation. I have offered her methods to quit in the past. She will continue to think on this.  We will plan on a four week follow-up with laboratory studies, chemotherapy and an office visit. She will continue with every 2 week nivolumab.   She does not need any refills at this time.  Orders Placed This Encounter  Procedures  . CT Abdomen Pelvis W Contrast    JH/ANGIE      ISTAT IF NEEDED     WILL BE GIVEN READICAT  MEDICAID    Standing Status: Future     Number of Occurrences:      Standing Expiration Date: 10/20/2016    Order Specific Question:  If indicated for the ordered procedure, I authorize the administration of contrast media per Radiology protocol    Answer:  Yes    Order Specific Question:  Reason for Exam (SYMPTOM  OR DIAGNOSIS REQUIRED)    Answer:  NSCLC, restaging    Order Specific Question:  Is the patient pregnant?    Answer:  No    Order Specific Question:  Preferred imaging location?    Answer:  [facility]  . CT Chest W Contrast    JH/ANGIE     ISTAT IF NEEDED    MEDICAID    Standing Status: Future     Number of Occurrences:      Standing Expiration Date: 10/20/2016    Order Specific Question:  If indicated for the ordered procedure, I authorize the administration of contrast media per Radiology protocol    Answer:  Yes     Order Specific Question:  Reason for Exam (SYMPTOM  OR DIAGNOSIS REQUIRED)    Answer:  NSCLC, restaging    Order Specific Question:  Is the patient pregnant?    Answer:  No    Order Specific Question:  Preferred imaging location?    Answer:  [facility]   All questions were answered. The patient knows to call the clinic with any problems, questions or concerns. We can certainly see the patient much sooner if necessary.   This document serves as a record of services personally performed by Ancil Linsey, MD. It was created on her behalf by Toni Amend, a trained medical scribe. The creation of this record is based on the scribe's personal observations and the provider's statements to them. This document has been checked and approved by the attending provider.  I have reviewed the above documentation for accuracy and completeness, and I agree with the above.  This note was signed electronically  Larene Beach K. Whitney Muse, MD

## 2015-10-21 NOTE — Patient Instructions (Signed)
[facility] Discharge Instructions for Patients Receiving Chemotherapy  Today you received the following chemotherapy agents Opdivo. You also received Zometa infusion today as ordered.  To help prevent nausea and vomiting after your treatment, we encourage you to take your nausea medication as instructed. If you develop nausea and vomiting that is not controlled by your nausea medication, call the clinic. If it is after clinic hours your family physician or the after hours number for the clinic or go to the Emergency Department. BELOW ARE SYMPTOMS THAT SHOULD BE REPORTED IMMEDIATELY:  *FEVER GREATER THAN 101.0 F  *CHILLS WITH OR WITHOUT FEVER  NAUSEA AND VOMITING THAT IS NOT CONTROLLED WITH YOUR NAUSEA MEDICATION  *UNUSUAL SHORTNESS OF BREATH  *UNUSUAL BRUISING OR BLEEDING  TENDERNESS IN MOUTH AND THROAT WITH OR WITHOUT PRESENCE OF ULCERS  *URINARY PROBLEMS  *BOWEL PROBLEMS  UNUSUAL RASH Items with * indicate a potential emergency and should be followed up as soon as possible.  Return as scheduled.  I have been informed and understand all the instructions given to me. I know to contact the clinic, my physician, or go to the Emergency Department if any problems should occur. I do not have any questions at this time, but understand that I may call the clinic during office hours or the Patient Navigator at [phone] should I have any questions or need assistance in obtaining follow up care.    __________________________________________  _____________  __________ Signature of Patient or Authorized Representative            Date                   Time    __________________________________________ Nurse's Signature

## 2015-10-21 NOTE — Progress Notes (Signed)
Hemoglobin 12.1 today. Aranesp not needed, treatment parameters not met.  Tolerated chemo well. Ambulatory on discharge home with family.

## 2015-11-04 ENCOUNTER — Telehealth (HOSPITAL_COMMUNITY): Payer: Self-pay | Admitting: Emergency Medicine

## 2015-11-04 ENCOUNTER — Encounter (HOSPITAL_COMMUNITY): Payer: 59 | Attending: Hematology and Oncology

## 2015-11-04 ENCOUNTER — Encounter (HOSPITAL_BASED_OUTPATIENT_CLINIC_OR_DEPARTMENT_OTHER): Payer: 59

## 2015-11-04 ENCOUNTER — Encounter (HOSPITAL_COMMUNITY): Payer: Self-pay

## 2015-11-04 ENCOUNTER — Ambulatory Visit (HOSPITAL_COMMUNITY): Payer: 59

## 2015-11-04 VITALS — BP 104/61 | HR 79 | Temp 98.4°F | Resp 18 | Wt 90.0 lb

## 2015-11-04 DIAGNOSIS — C3411 Malignant neoplasm of upper lobe, right bronchus or lung: Secondary | ICD-10-CM | POA: Insufficient documentation

## 2015-11-04 DIAGNOSIS — K219 Gastro-esophageal reflux disease without esophagitis: Secondary | ICD-10-CM | POA: Diagnosis not present

## 2015-11-04 DIAGNOSIS — Z9089 Acquired absence of other organs: Secondary | ICD-10-CM | POA: Diagnosis not present

## 2015-11-04 DIAGNOSIS — Z5111 Encounter for antineoplastic chemotherapy: Secondary | ICD-10-CM | POA: Diagnosis not present

## 2015-11-04 DIAGNOSIS — I10 Essential (primary) hypertension: Secondary | ICD-10-CM | POA: Diagnosis not present

## 2015-11-04 DIAGNOSIS — E785 Hyperlipidemia, unspecified: Secondary | ICD-10-CM | POA: Insufficient documentation

## 2015-11-04 DIAGNOSIS — F17210 Nicotine dependence, cigarettes, uncomplicated: Secondary | ICD-10-CM | POA: Diagnosis not present

## 2015-11-04 DIAGNOSIS — Z79899 Other long term (current) drug therapy: Secondary | ICD-10-CM | POA: Diagnosis not present

## 2015-11-04 DIAGNOSIS — C781 Secondary malignant neoplasm of mediastinum: Secondary | ICD-10-CM | POA: Insufficient documentation

## 2015-11-04 DIAGNOSIS — C7989 Secondary malignant neoplasm of other specified sites: Secondary | ICD-10-CM | POA: Diagnosis not present

## 2015-11-04 DIAGNOSIS — C7971 Secondary malignant neoplasm of right adrenal gland: Secondary | ICD-10-CM | POA: Insufficient documentation

## 2015-11-04 DIAGNOSIS — J449 Chronic obstructive pulmonary disease, unspecified: Secondary | ICD-10-CM | POA: Insufficient documentation

## 2015-11-04 DIAGNOSIS — C772 Secondary and unspecified malignant neoplasm of intra-abdominal lymph nodes: Secondary | ICD-10-CM | POA: Diagnosis not present

## 2015-11-04 DIAGNOSIS — E278 Other specified disorders of adrenal gland: Secondary | ICD-10-CM

## 2015-11-04 DIAGNOSIS — C7972 Secondary malignant neoplasm of left adrenal gland: Secondary | ICD-10-CM | POA: Diagnosis not present

## 2015-11-04 LAB — COMPREHENSIVE METABOLIC PANEL
ALBUMIN: 3.7 g/dL (ref 3.5–5.0)
ALK PHOS: 48 U/L (ref 38–126)
ALT: 8 U/L — ABNORMAL LOW (ref 14–54)
ANION GAP: 6 (ref 5–15)
AST: 17 U/L (ref 15–41)
BILIRUBIN TOTAL: 0.3 mg/dL (ref 0.3–1.2)
BUN: 15 mg/dL (ref 6–20)
CO2: 24 mmol/L (ref 22–32)
Calcium: 9.7 mg/dL (ref 8.9–10.3)
Chloride: 107 mmol/L (ref 101–111)
Creatinine, Ser: 1.57 mg/dL — ABNORMAL HIGH (ref 0.44–1.00)
GFR, EST AFRICAN AMERICAN: 41 mL/min — AB (ref >60)
GFR, EST NON AFRICAN AMERICAN: 35 mL/min — AB (ref >60)
GLUCOSE: 115 mg/dL — AB (ref 65–99)
POTASSIUM: 4.2 mmol/L (ref 3.5–5.1)
Sodium: 137 mmol/L (ref 135–145)
TOTAL PROTEIN: 6.8 g/dL (ref 6.5–8.1)

## 2015-11-04 LAB — CBC WITH DIFFERENTIAL/PLATELET
BASOS PCT: 0 %
Basophils Absolute: 0 10*3/uL (ref 0.0–0.1)
Eosinophils Absolute: 0.1 10*3/uL (ref 0.0–0.7)
Eosinophils Relative: 2 %
HEMATOCRIT: 34.3 % — AB (ref 36.0–46.0)
Hemoglobin: 11.1 g/dL — ABNORMAL LOW (ref 12.0–15.0)
LYMPHS ABS: 2 10*3/uL (ref 0.7–4.0)
Lymphocytes Relative: 33 %
MCH: 31.6 pg (ref 26.0–34.0)
MCHC: 32.4 g/dL (ref 30.0–36.0)
MCV: 97.7 fL (ref 78.0–100.0)
MONO ABS: 0.5 10*3/uL (ref 0.1–1.0)
MONOS PCT: 8 %
NEUTROS ABS: 3.3 10*3/uL (ref 1.7–7.7)
Neutrophils Relative %: 57 %
Platelets: 220 10*3/uL (ref 150–400)
RBC: 3.51 MIL/uL — ABNORMAL LOW (ref 3.87–5.11)
RDW: 14.9 % (ref 11.5–15.5)
WBC: 5.9 10*3/uL (ref 4.0–10.5)

## 2015-11-04 LAB — TSH: TSH: 2.322 u[IU]/mL (ref 0.350–4.500)

## 2015-11-04 MED ORDER — SODIUM CHLORIDE 0.9 % IJ SOLN
10.0000 mL | INTRAMUSCULAR | Status: DC | PRN
  Administered 2015-11-04: 10 mL
  Filled 2015-11-04: qty 10

## 2015-11-04 MED ORDER — HEPARIN SOD (PORK) LOCK FLUSH 100 UNIT/ML IV SOLN
500.0000 [IU] | Freq: Once | INTRAVENOUS | Status: AC | PRN
  Administered 2015-11-04: 500 [IU]
  Filled 2015-11-04: qty 5

## 2015-11-04 MED ORDER — SODIUM CHLORIDE 0.9 % IV SOLN
240.0000 mg | Freq: Once | INTRAVENOUS | Status: AC
  Administered 2015-11-04: 240 mg via INTRAVENOUS
  Filled 2015-11-04: qty 24

## 2015-11-04 MED ORDER — SODIUM CHLORIDE 0.9 % IV SOLN
Freq: Once | INTRAVENOUS | Status: AC
  Administered 2015-11-04: 11:00:00 via INTRAVENOUS

## 2015-11-04 NOTE — Patient Instructions (Signed)
[place] at [facility] Discharge Instructions  RECOMMENDATIONS MADE BY THE CONSULTANT AND ANY TEST RESULTS WILL BE SENT TO YOUR REFERRING PHYSICIAN.  Opdivo infusion today. Return as scheduled for Opdivo infusion and office visit.   Thank you for choosing [place] at [facility] to provide your oncology and hematology care.  To afford each patient quality time with our provider, please arrive at least 15 minutes before your scheduled appointment time.    You need to re-schedule your appointment should you arrive 10 or more minutes late.  We strive to give you quality time with our providers, and arriving late affects you and other patients whose appointments are after yours.  Also, if you no show three or more times for appointments you may be dismissed from the clinic at the providers discretion.     Again, thank you for choosing [facility].  Our hope is that these requests will decrease the amount of time that you wait before being seen by our physicians.       _____________________________________________________________  Should you have questions after your visit to [facility], please contact our office at [phone] between the hours of 8:30 a.m. and 4:30 p.m.  Voicemails left after 4:30 p.m. will not be returned until the following business day.  For prescription refill requests, have your pharmacy contact our office.

## 2015-11-04 NOTE — Telephone Encounter (Signed)
-----   Message from Baird Cancer, PA-C sent at 11/04/2015  4:14 PM EST ----- I have reviewed all lab results which are normal or stable. Please inform the patient.

## 2015-11-04 NOTE — Telephone Encounter (Signed)
Notified pt that lab work was good today

## 2015-11-04 NOTE — Progress Notes (Signed)
Okay to treat today per T. Kefalas, PA-C.   Tolerated infusion w/o adverse reaction.  A&Ox4, VSS.  In no distress.  Discharged ambulatory.

## 2015-11-05 NOTE — Progress Notes (Signed)
LABS DRAWN BY RN Lakyla Biswas AT LUNCH

## 2015-11-11 ENCOUNTER — Other Ambulatory Visit (HOSPITAL_COMMUNITY): Payer: Self-pay | Admitting: Hematology & Oncology

## 2015-11-11 ENCOUNTER — Other Ambulatory Visit (HOSPITAL_COMMUNITY): Payer: Self-pay | Admitting: Oncology

## 2015-11-17 ENCOUNTER — Ambulatory Visit (HOSPITAL_COMMUNITY)
Admission: RE | Admit: 2015-11-17 | Discharge: 2015-11-17 | Disposition: A | Discharge status: Home / Self Care | Payer: 59 | Source: Ambulatory Visit | Attending: Hematology & Oncology | Admitting: Hematology & Oncology

## 2015-11-17 DIAGNOSIS — E278 Other specified disorders of adrenal gland: Secondary | ICD-10-CM

## 2015-11-17 DIAGNOSIS — E279 Disorder of adrenal gland, unspecified: Secondary | ICD-10-CM | POA: Diagnosis not present

## 2015-11-17 DIAGNOSIS — C7951 Secondary malignant neoplasm of bone: Secondary | ICD-10-CM | POA: Diagnosis not present

## 2015-11-17 DIAGNOSIS — Z9889 Other specified postprocedural states: Secondary | ICD-10-CM | POA: Diagnosis not present

## 2015-11-17 DIAGNOSIS — C3411 Malignant neoplasm of upper lobe, right bronchus or lung: Secondary | ICD-10-CM | POA: Insufficient documentation

## 2015-11-17 MED ORDER — IOHEXOL 300 MG/ML  SOLN
80.0000 mL | Freq: Once | INTRAMUSCULAR | Status: AC | PRN
  Administered 2015-11-17: 80 mL via INTRAVENOUS

## 2015-11-18 ENCOUNTER — Encounter (HOSPITAL_BASED_OUTPATIENT_CLINIC_OR_DEPARTMENT_OTHER): Payer: 59

## 2015-11-18 ENCOUNTER — Encounter (HOSPITAL_COMMUNITY): Payer: Self-pay | Admitting: Hematology & Oncology

## 2015-11-18 ENCOUNTER — Encounter: Payer: Self-pay | Admitting: *Deleted

## 2015-11-18 ENCOUNTER — Encounter (HOSPITAL_BASED_OUTPATIENT_CLINIC_OR_DEPARTMENT_OTHER): Payer: 59 | Admitting: Hematology & Oncology

## 2015-11-18 ENCOUNTER — Other Ambulatory Visit (HOSPITAL_COMMUNITY): Payer: 59

## 2015-11-18 ENCOUNTER — Encounter (HOSPITAL_COMMUNITY): Payer: 59

## 2015-11-18 VITALS — BP 119/71 | HR 88 | Temp 98.3°F | Resp 16 | Wt 89.0 lb

## 2015-11-18 VITALS — BP 99/63 | HR 73 | Temp 98.4°F | Resp 16

## 2015-11-18 DIAGNOSIS — Z5112 Encounter for antineoplastic immunotherapy: Secondary | ICD-10-CM

## 2015-11-18 DIAGNOSIS — E278 Other specified disorders of adrenal gland: Secondary | ICD-10-CM

## 2015-11-18 DIAGNOSIS — C3411 Malignant neoplasm of upper lobe, right bronchus or lung: Secondary | ICD-10-CM

## 2015-11-18 DIAGNOSIS — C7951 Secondary malignant neoplasm of bone: Secondary | ICD-10-CM

## 2015-11-18 DIAGNOSIS — Z720 Tobacco use: Secondary | ICD-10-CM | POA: Diagnosis not present

## 2015-11-18 LAB — CBC WITH DIFFERENTIAL/PLATELET
Basophils Absolute: 0 10*3/uL (ref 0.0–0.1)
Basophils Relative: 0 %
EOS ABS: 0.1 10*3/uL (ref 0.0–0.7)
Eosinophils Relative: 1 %
HEMATOCRIT: 36 % (ref 36.0–46.0)
HEMOGLOBIN: 11.9 g/dL — AB (ref 12.0–15.0)
LYMPHS ABS: 2 10*3/uL (ref 0.7–4.0)
LYMPHS PCT: 28 %
MCH: 31.6 pg (ref 26.0–34.0)
MCHC: 33.1 g/dL (ref 30.0–36.0)
MCV: 95.5 fL (ref 78.0–100.0)
MONOS PCT: 10 %
Monocytes Absolute: 0.7 10*3/uL (ref 0.1–1.0)
NEUTROS ABS: 4.3 10*3/uL (ref 1.7–7.7)
NEUTROS PCT: 61 %
Platelets: 270 10*3/uL (ref 150–400)
RBC: 3.77 MIL/uL — AB (ref 3.87–5.11)
RDW: 15.2 % (ref 11.5–15.5)
WBC: 7 10*3/uL (ref 4.0–10.5)

## 2015-11-18 LAB — COMPREHENSIVE METABOLIC PANEL
ALK PHOS: 50 U/L (ref 38–126)
ALT: 9 U/L — AB (ref 14–54)
AST: 19 U/L (ref 15–41)
Albumin: 4.1 g/dL (ref 3.5–5.0)
Anion gap: 7 (ref 5–15)
BILIRUBIN TOTAL: 0.2 mg/dL — AB (ref 0.3–1.2)
BUN: 14 mg/dL (ref 6–20)
CALCIUM: 9.7 mg/dL (ref 8.9–10.3)
CO2: 23 mmol/L (ref 22–32)
CREATININE: 1.37 mg/dL — AB (ref 0.44–1.00)
Chloride: 105 mmol/L (ref 101–111)
GFR calc non Af Amer: 41 mL/min — ABNORMAL LOW (ref >60)
GFR, EST AFRICAN AMERICAN: 48 mL/min — AB (ref >60)
GLUCOSE: 86 mg/dL (ref 65–99)
Potassium: 3.9 mmol/L (ref 3.5–5.1)
SODIUM: 135 mmol/L (ref 135–145)
TOTAL PROTEIN: 7.7 g/dL (ref 6.5–8.1)

## 2015-11-18 MED ORDER — SODIUM CHLORIDE 0.9 % IV SOLN
2.0000 mg | Freq: Once | INTRAVENOUS | Status: AC
  Administered 2015-11-18: 2 mg via INTRAVENOUS
  Filled 2015-11-18: qty 2.5

## 2015-11-18 MED ORDER — SODIUM CHLORIDE 0.9 % IV SOLN
Freq: Once | INTRAVENOUS | Status: AC
  Administered 2015-11-18: 11:00:00 via INTRAVENOUS

## 2015-11-18 MED ORDER — OXYCODONE HCL 10 MG PO TABS
10.0000 mg | ORAL_TABLET | Freq: Four times a day (QID) | ORAL | Status: DC | PRN
Start: 2015-11-18 — End: 2015-11-18

## 2015-11-18 MED ORDER — OXYCODONE HCL 10 MG PO TABS: 10.0000 mg | ORAL_TABLET | Freq: Four times a day (QID) | ORAL | Status: DC | PRN

## 2015-11-18 MED ORDER — SODIUM CHLORIDE 0.9 % IJ SOLN
10.0000 mL | INTRAMUSCULAR | Status: DC | PRN
  Administered 2015-11-18: 10 mL
  Filled 2015-11-18: qty 10

## 2015-11-18 MED ORDER — HEPARIN SOD (PORK) LOCK FLUSH 100 UNIT/ML IV SOLN
500.0000 [IU] | Freq: Once | INTRAVENOUS | Status: AC | PRN
  Administered 2015-11-18: 500 [IU]
  Filled 2015-11-18: qty 5

## 2015-11-18 MED ORDER — SODIUM CHLORIDE 0.9 % IV SOLN
240.0000 mg | Freq: Once | INTRAVENOUS | Status: AC
  Administered 2015-11-18: 240 mg via INTRAVENOUS
  Filled 2015-11-18: qty 24

## 2015-11-18 NOTE — Progress Notes (Signed)
[place] Clinical Social Work  Clinical Social Work was referred by [place] rounding for assessment of psychosocial needs due to previous concerns. Clinical Social Worker met with patient and daughter at [facility] to offer support and re-assess for needs.  Pt and daughter report they were able to get assistance through the [employer] for additional gas cards. They received two more and were very appreciative. Pt and daughter had concerns that pt's medicaid had stopped. Pt reports her husband recently put her on his insurance and the medicaid stopped as a result. Pt still meets income guidelines for this based on her ss disability payment. Pt and daughter educated today that pt should review medicare eligibility through ss office. Pt has stage 4 lung cancer and should be eligible for medicare after 2 years on disability. Pt reports to be doing really well. She agrees to reach out as needed.   Clinical Social Work interventions: Resource education Supportive listening [place], [place] Tuesdays   [phone]

## 2015-11-18 NOTE — Patient Instructions (Signed)
[facility] Discharge Instructions for Patients Receiving Chemotherapy  Today you received the following chemotherapy agents Opdivo. You also received Zometa infusion as ordered.  To help prevent nausea and vomiting after your treatment, we encourage you to take your nausea medication as instructed. If you develop nausea and vomiting that is not controlled by your nausea medication, call the clinic. If it is after clinic hours your family physician or the after hours number for the clinic or go to the Emergency Department. BELOW ARE SYMPTOMS THAT SHOULD BE REPORTED IMMEDIATELY:  *FEVER GREATER THAN 101.0 F  *CHILLS WITH OR WITHOUT FEVER  NAUSEA AND VOMITING THAT IS NOT CONTROLLED WITH YOUR NAUSEA MEDICATION  *UNUSUAL SHORTNESS OF BREATH  *UNUSUAL BRUISING OR BLEEDING  TENDERNESS IN MOUTH AND THROAT WITH OR WITHOUT PRESENCE OF ULCERS  *URINARY PROBLEMS  *BOWEL PROBLEMS  UNUSUAL RASH Items with * indicate a potential emergency and should be followed up as soon as possible.  Return as scheduled.  I have been informed and understand all the instructions given to me. I know to contact the clinic, my physician, or go to the Emergency Department if any problems should occur. I do not have any questions at this time, but understand that I may call the clinic during office hours or the Patient Navigator at [phone] should I have any questions or need assistance in obtaining follow up care.    __________________________________________  _____________  __________ Signature of Patient or Authorized Representative            Date                   Time    __________________________________________ Nurse's Signature

## 2015-11-18 NOTE — Progress Notes (Signed)
Tolerated chemo well. Ambulatory on discharge home with daughter.

## 2015-11-18 NOTE — Patient Instructions (Signed)
[place] at [facility] Discharge Instructions  RECOMMENDATIONS MADE BY THE CONSULTANT AND ANY TEST RESULTS WILL BE SENT TO YOUR REFERRING PHYSICIAN.   Exam completed by Dr Whitney Muse today You can work. Your scans were good. Opdivo every 2 weeks. Zometa every 4 weeks Pain medication refill Return to see the doctor in 1 month Please call the clinic if you have any questions or concerns   Thank you for choosing [place] at [facility] to provide your oncology and hematology care.  To afford each patient quality time with our provider, please arrive at least 15 minutes before your scheduled appointment time.    You need to re-schedule your appointment should you arrive 10 or more minutes late.  We strive to give you quality time with our providers, and arriving late affects you and other patients whose appointments are after yours.  Also, if you no show three or more times for appointments you may be dismissed from the clinic at the providers discretion.     Again, thank you for choosing [facility].  Our hope is that these requests will decrease the amount of time that you wait before being seen by our physicians.       _____________________________________________________________  Should you have questions after your visit to [facility], please contact our office at [phone] between the hours of 8:30 a.m. and 4:30 p.m.  Voicemails left after 4:30 p.m. will not be returned until the following business day.  For prescription refill requests, have your pharmacy contact our office.

## 2015-11-18 NOTE — Progress Notes (Signed)
[name], stage IV, bilateral adrenal metastases, retroperitoneal lymph nodes  Biopsy on 08/01/2014 c/w poorly differentiated adenocarcinoma  XRT completed on 10/27/2014  Right Brachial plexus neuralgia  EGFR mutation negative ALK mutation negative  Extensive soft tissue thickening in the posterior nasopharynx with bilateral level IIa lymphadenopathy in the neck. The possibility of a second primary neoplasm in this region warrants consideration. Clinical correlation is recommended. See onc hx...  CURRENT THERAPY: Nivolumab/zometa/Aranesp  INTERVAL HISTORY: Melissa Sullivan 59 y.o. female returns for follow-up of stage IV adenocarcinoma of the lung. She is doing well.  She sleeps ok. She denies any pain. Her mood is good. She notes some days she is "too busy." She has no other major complaints.  Melissa Sullivan returns to the [facility] today with her daughter. She is receiving treatment today. She says she's "been stressing over her CT scans."  Next time it's time to get her scans, we will tell her daughter ahead of time. The scans stress her out. She seems altogether well except for the anxiety induced by upcoming scans, which she requests to not know about in advance.  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 She says that "staying up in the house" is making her go crazy, but otherwise, she says she feels well.  She denies any problems with her bowels or any problems with sleeping, eating, or other complaints. Her energy is okay, she is eating well, and remarks that her fingers and toes feel okay.    MEDICAL HISTORY: Past Medical History  Diagnosis Date  . Hypertension   . Reflux   . Hyperlipidemia   . Lung mass 07/28/2014  . Adrenal mass, right ([place]) 07/28/2014  . Diabetes mellitus without complication ([place])   . Cancer [name])     lung  right    has Adrenal mass, right ([place]); Cancer of upper lobe of right lung ([place]); ARF  (acute renal failure) ([place]); Abnormal finding on imaging; Oropharyngeal dysphagia; Nausea and vomiting; Hypercalcemia; Anemia in neoplastic disease; Dysphagia; and Encounter for screening colonoscopy on her problem list.      Cancer of upper lobe of right lung ([place])   07/28/2014 Imaging CT chest: Large R apical mass consistent with malignancy. This is destroying the R 2nd rib with extension into adjacent soft tissue. R hilar adenopathy with R 5cm adrenal metastatic lesion.   08/01/2014 Initial Biopsy Lung, needle/core biopsy(ies), right upper lobe - POORLY DIFFERENTIATED ADENOCARCINOMA, SEE COMMENT.   08/08/2014 PET scan Large hypermetabolic R apical mass with evidence of direct chest wall and mediastinal invasion, right retrocrural lymphadenopathy, extensive retroperitoneal lymphadenopathy, and metastatic lesions to the adrenal glands    09/02/2014 - 11/04/2014 Chemotherapy Cisplatin/Pemetrexed/Avastin every 21 days x 4 cycles   10/07/2014 - 10/27/2014 Radiation Therapy Right lung apex for control of brachioplexopathy.   12/24/2014 - 02/25/2015 Chemotherapy Alimta/Avastin every 21 days.   02/20/2015 Imaging Increase in size of right adrenal metastasis and subjacent confluent retrocaval lymphadenopathy   02/25/2015 -  Chemotherapy Nivolumab, zometa   05/04/2015 Imaging CT CAP- Stable to slight decrease in the posterior right apical lesion. Stable appearance of posterior right upper rib an upper thoracic bony lesions. Slight improvement in right upper lobe tree-in-bud opacity. No new or progressive findings in...   07/28/2015 Imaging CT CAP- Reduced size of the right apical pleural parenchymal lesion and reduced size of the right adrenal metastatic lesion. Resolution of prior retrocrural adenopathy.  Right eccentric T1 and T2 sclerosis with sclerosis and tapering of the right second..     has No Known Allergies.  Ms. Branca does not currently have medications on file.  SURGICAL HISTORY: Past Surgical History    Procedure Laterality Date  . Appendectomy    . Lung biopsy Right 07/2014    CT guided  . Portacath placement Left 09/01/2014  . Esophagogastroduodenoscopy N/A 12/09/2014    GEX:BMWUXL esophageal stricture/mild-to-noderate erosive gastritis. negative H.pylori  . Savory dilation N/A 12/09/2014    Procedure: SAVORY DILATION;  Surgeon: [name], MD;  Location: [facility];  Service: Endoscopy;  Laterality: N/A;  Melissa Sullivan dilation N/A 12/09/2014    Procedure: Melissa Sullivan DILATION;  Surgeon: [name], MD;  Location: [facility];  Service: Endoscopy;  Laterality: N/A;  . Flexible sigmoidoscopy  2011    Dr. [name]: hyperplastic polyp    SOCIAL HISTORY: Social History   Social History  . Marital Status: Married    Spouse Name: N/A  . Number of Children: N/A  . Years of Education: N/A   Occupational History  . Not on file.   Social History Main Topics  . Smoking status: Light Tobacco Smoker -- 0.30 packs/day    Types: Cigarettes  . Smokeless tobacco: Never Used  . Alcohol Use: No  . Drug Use: No  . Sexual Activity: Not on file   Other Topics Concern  . Not on file   Social History Narrative    FAMILY HISTORY: Family History  Problem Relation Age of Onset  . Cancer Sister     Review of Systems  Constitutional: Negative for fatigue. Negative for fever and chills.  HENT: Negative for congestion, hearing loss, nosebleeds, sore throat and tinnitus.   Eyes: Negative for blurred vision, double vision, pain and discharge.  Respiratory: Negative for cough, hemoptysis, sputum production, shortness of breath and wheezing.   Cardiovascular: Negative for chest pain, palpitations, claudication, leg swelling and PND.  Gastrointestinal: . Negative for heartburn, nausea, vomiting, diarrhea, constipation, blood in stool and melena.  Genitourinary: Negative for dysuria, urgency, frequency and hematuria.  Musculoskeletal: Negative for myalgias, joint pain and falls.  Skin:  Negative for itching and rash.  Neurological: Negative for dizziness, tingling, tremors, sensory change, speech change, focal weakness, seizures, loss of consciousness, weakness and headaches.  Endo/Heme/Allergies: Does not bruise/bleed easily.  Psychiatric/Behavioral: Negative for depression, suicidal ideas, memory loss and substance abuse. The patient is not nervous/anxious and does not have insomnia.    14 point review of systems was performed and is negative except as detailed under history of present illness and above   PHYSICAL EXAMINATION  ECOG PERFORMANCE STATUS: 1 - Symptomatic but completely ambulatory  Filed Vitals:   11/18/15 0918  BP: 119/71  Pulse: 88  Temp: 98.3 F (36.8 C)  Resp: 16    Physical Exam  Constitutional: She is oriented to person, place, and time.  HENT:  Head: Normocephalic and atraumatic.  Nose: Nose normal.  Mouth/Throat: Oropharynx is clear and moist. No oropharyngeal exudate.  Eyes: Conjunctivae and EOM are normal. Pupils are equal, round, and reactive to light. Right eye exhibits no discharge. Left eye exhibits no discharge. No scleral icterus.  Neck: Normal range of motion. Neck supple. No tracheal deviation present. No thyromegaly present.  Cardiovascular: Normal rate, regular rhythm and normal heart sounds.  Exam reveals no gallop and no friction rub.   No murmur heard. Pulmonary/Chest: Effort normal and breath sounds normal. She has no wheezes.  She has no rales.  Abdominal: Soft. Bowel sounds are normal. She exhibits no distension and no mass. There is no tenderness. There is no rebound and no guarding.  Thin  Musculoskeletal: Normal range of motion. She exhibits no edema.  Lymphadenopathy:    She has no cervical adenopathy.  Neurological: She is alert and oriented to person, place, and time. She has normal reflexes. No cranial nerve deficit. Gait normal. Coordination normal.  Skin: Skin is warm and dry. No rash noted.  Psychiatric: Mood,  memory, affect and judgment normal.  Nursing note and vitals reviewed.   LABORATORY DATA: I have reviewed the data below as listed. CBC    Component Value Date/Time   WBC 7.0 11/18/2015 0921   RBC 3.77* 11/18/2015 0921   RBC 2.45* 01/15/2015 0901   HGB 11.9* 11/18/2015 0921   HCT 36.0 11/18/2015 0921   PLT 270 11/18/2015 0921   MCV 95.5 11/18/2015 0921   MCH 31.6 11/18/2015 0921   MCHC 33.1 11/18/2015 0921   RDW 15.2 11/18/2015 0921   LYMPHSABS 2.0 11/18/2015 0921   MONOABS 0.7 11/18/2015 0921   EOSABS 0.1 11/18/2015 0921   BASOSABS 0.0 11/18/2015 0921   CMP     Component Value Date/Time   NA 137 11/04/2015 1151   K 4.2 11/04/2015 1151   CL 107 11/04/2015 1151   CO2 24 11/04/2015 1151   GLUCOSE 115* 11/04/2015 1151   BUN 15 11/04/2015 1151   CREATININE 1.57* 11/04/2015 1151   CALCIUM 9.7 11/04/2015 1151   CALCIUM 9.7 06/03/2015 1000   PROT 6.8 11/04/2015 1151   ALBUMIN 3.7 11/04/2015 1151   AST 17 11/04/2015 1151   ALT 8* 11/04/2015 1151   ALKPHOS 48 11/04/2015 1151   BILITOT 0.3 11/04/2015 1151   GFRNONAA 35* 11/04/2015 1151   GFRAA 41* 11/04/2015 1151   RADIOLOGY: Study Result     CLINICAL DATA: Right lung cancer, ongoing chemotherapy. Adrenal mass.  EXAM: CT CHEST, ABDOMEN, AND PELVIS WITH CONTRAST  TECHNIQUE: Multidetector CT imaging of the chest, abdomen and pelvis was performed following the standard protocol during bolus administration of intravenous contrast.  CONTRAST: 73m OMNIPAQUE IOHEXOL 300 MG/ML SOLN  COMPARISON: 07/28/2015.  FINDINGS: CT CHEST FINDINGS  Mediastinum/Lymph Nodes: Left IJ Port-A-Cath terminates in the SVC. Mediastinal lymph nodes are not enlarged by CT size criteria. No hilar or axillary adenopathy. Atherosclerotic calcification of the arterial vasculature. Heart size normal. No pericardial effusion.  Lungs/Pleura: Mild to moderate centrilobular emphysema. Soft tissue thickening in the posteromedial  aspect of the apical right hemi thorax appears stable. Mild mucoid impaction in the medial aspect of the left upper lobe. Minimal scarring in the right middle lobe and lingula. Lungs are otherwise clear. No pleural fluid. Nodularity within the trachea and mainstem bronchi is likely due to adherent debris, given relative lack of this finding on 07/28/2015.  Musculoskeletal: Mild irregularity of the posterior right rib, stable, indicative of prior chest wall invasion. Sclerosis along the right aspects of the T1 and T2 vertebral bodies, unchanged, indicative of prior metastatic disease or treatment changes.  CT ABDOMEN PELVIS FINDINGS  Hepatobiliary: The liver and gallbladder are unremarkable. No biliary ductal dilatation.  Pancreas: Negative.  Spleen: Negative.  Adrenals/Urinary Tract: Irregular right adrenal mass measures approximately 2.5 x 4.4 cm, when measured the same as on the prior exam, stable. Left adrenal gland and kidneys are unremarkable. Ureters are decompressed. Bladder is low in volume.  Stomach/Bowel: Stomach, small bowel and colon are unremarkable. Appendectomy.  Vascular/Lymphatic:  Atherosclerotic calcification of the arterial vasculature without abdominal aortic aneurysm. No pathologically enlarged lymph nodes.  Reproductive: Uterus and ovaries are visualized.  Other: Postoperative changes along the ventral right pelvic wall. No free fluid.  Musculoskeletal: Well-circumscribed sclerotic lesion with lucent center in the left iliac wing, along the left sacroiliac joint, unchanged.  IMPRESSION: 1. Stable soft tissue thickening in the apex of the right hemi thorax. Stable right adrenal metastasis. 2. Nodularity along the trachea and mainstem bronchi, relatively new from 07/28/2015, favoring adherent debris. Attention on followup exams is warranted.   Electronically Signed  By: Lorin Picket M.D.  On: 11/17/2015 09:49       ASSESSMENT and THERAPY PLAN:   Stage IV adenocarcinoma of the lung, EGFR and ALK negative  She is certainly doing well clinically. CT scans show stable disease. These were reviewed with the patient. Scans will be repeated in 3 months. We will continue with current therapy. She has absolutely no major complaints today. Again reviewed the potential side effects of her current treatment.  Hypercalcemia/bony metastatic disease  She will continue with Zometa as prescribed.  Tobacco ABUSE  I re-emphasized the benefits of smoking cessation. I have offered her methods to quit in the past. She has cut back significantly.  We will plan on a four week follow-up with laboratory studies, chemotherapy and an office visit. She will continue with every 2 week nivolumab.   She does not need any refills at this time.   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  All questions were answered. The patient knows to call the clinic with any problems, questions or concerns. We can certainly see the patient much sooner if necessary.   This document serves as a record of services personally performed by Ancil Linsey, MD. It was created on her behalf by Toni Amend, a trained medical scribe. The creation of this record is based on the scribe's personal observations and the provider's statements to them. This document has been checked and approved by the attending provider.  I have reviewed the above documentation for accuracy and completeness, and I agree with the above.  This note was signed electronically  Larene Beach K. Whitney Muse, MD

## 2015-11-24 ENCOUNTER — Other Ambulatory Visit (HOSPITAL_COMMUNITY): Payer: Self-pay | Admitting: Oncology

## 2015-11-24 ENCOUNTER — Other Ambulatory Visit (HOSPITAL_COMMUNITY): Payer: Self-pay

## 2015-11-24 ENCOUNTER — Encounter (HOSPITAL_COMMUNITY): Payer: Self-pay | Admitting: Oncology

## 2015-11-24 NOTE — Progress Notes (Signed)
Patient has an abscessed tooth.  She came by the clinic requeting a letter for her dentist to be cleared for tooth extraction.  Please see letter in chart review.  In short, patient received Zometa on 11/18/2015 and therefore, we recommended conservative management of abscess with antibiotics.    We will hold Zometa x 2 months.  She can proceed with Tooth extraction at the end of Feb or beginning of March 2017.    Robynn Pane, PA-C 11/24/2015 4:16 PM

## 2015-12-02 ENCOUNTER — Encounter (HOSPITAL_COMMUNITY): Payer: Self-pay

## 2015-12-02 ENCOUNTER — Encounter (HOSPITAL_COMMUNITY): Payer: 59 | Attending: Hematology & Oncology

## 2015-12-02 ENCOUNTER — Encounter (HOSPITAL_BASED_OUTPATIENT_CLINIC_OR_DEPARTMENT_OTHER): Payer: 59

## 2015-12-02 VITALS — BP 101/62 | HR 87 | Temp 99.0°F | Resp 16 | Wt 88.6 lb

## 2015-12-02 DIAGNOSIS — Z5112 Encounter for antineoplastic immunotherapy: Secondary | ICD-10-CM | POA: Diagnosis not present

## 2015-12-02 DIAGNOSIS — C3411 Malignant neoplasm of upper lobe, right bronchus or lung: Secondary | ICD-10-CM

## 2015-12-02 DIAGNOSIS — E278 Other specified disorders of adrenal gland: Secondary | ICD-10-CM

## 2015-12-02 LAB — CBC WITH DIFFERENTIAL/PLATELET
Basophils Absolute: 0 10*3/uL (ref 0.0–0.1)
Basophils Relative: 0 %
Eosinophils Absolute: 0.1 10*3/uL (ref 0.0–0.7)
Eosinophils Relative: 1 %
HEMATOCRIT: 36.3 % (ref 36.0–46.0)
HEMOGLOBIN: 12 g/dL (ref 12.0–15.0)
LYMPHS ABS: 2.1 10*3/uL (ref 0.7–4.0)
Lymphocytes Relative: 25 %
MCH: 31.1 pg (ref 26.0–34.0)
MCHC: 33.1 g/dL (ref 30.0–36.0)
MCV: 94 fL (ref 78.0–100.0)
MONOS PCT: 7 %
Monocytes Absolute: 0.6 10*3/uL (ref 0.1–1.0)
NEUTROS ABS: 5.5 10*3/uL (ref 1.7–7.7)
NEUTROS PCT: 67 %
Platelets: 319 10*3/uL (ref 150–400)
RBC: 3.86 MIL/uL — ABNORMAL LOW (ref 3.87–5.11)
RDW: 15.5 % (ref 11.5–15.5)
WBC: 8.3 10*3/uL (ref 4.0–10.5)

## 2015-12-02 LAB — COMPREHENSIVE METABOLIC PANEL
ALT: 7 U/L — AB (ref 14–54)
ANION GAP: 8 (ref 5–15)
AST: 17 U/L (ref 15–41)
Albumin: 4 g/dL (ref 3.5–5.0)
Alkaline Phosphatase: 57 U/L (ref 38–126)
BUN: 15 mg/dL (ref 6–20)
CHLORIDE: 104 mmol/L (ref 101–111)
CO2: 23 mmol/L (ref 22–32)
CREATININE: 1.59 mg/dL — AB (ref 0.44–1.00)
Calcium: 10.5 mg/dL — ABNORMAL HIGH (ref 8.9–10.3)
GFR calc non Af Amer: 34 mL/min — ABNORMAL LOW (ref >60)
GFR, EST AFRICAN AMERICAN: 40 mL/min — AB (ref >60)
Glucose, Bld: 107 mg/dL — ABNORMAL HIGH (ref 65–99)
Potassium: 4.4 mmol/L (ref 3.5–5.1)
SODIUM: 135 mmol/L (ref 135–145)
Total Bilirubin: 0.5 mg/dL (ref 0.3–1.2)
Total Protein: 7.4 g/dL (ref 6.5–8.1)

## 2015-12-02 MED ORDER — SODIUM CHLORIDE 0.9 % IV SOLN
240.0000 mg | Freq: Once | INTRAVENOUS | Status: AC
  Administered 2015-12-02: 240 mg via INTRAVENOUS
  Filled 2015-12-02: qty 8

## 2015-12-02 MED ORDER — HEPARIN SOD (PORK) LOCK FLUSH 100 UNIT/ML IV SOLN
500.0000 [IU] | Freq: Once | INTRAVENOUS | Status: AC | PRN
  Administered 2015-12-02: 500 [IU]
  Filled 2015-12-02: qty 5

## 2015-12-02 MED ORDER — SODIUM CHLORIDE 0.9 % IJ SOLN
10.0000 mL | INTRAMUSCULAR | Status: DC | PRN
  Administered 2015-12-02: 10 mL
  Filled 2015-12-02: qty 10

## 2015-12-02 MED ORDER — SODIUM CHLORIDE 0.9 % IV SOLN
Freq: Once | INTRAVENOUS | Status: AC
  Administered 2015-12-02: 13:00:00 via INTRAVENOUS

## 2015-12-02 NOTE — Progress Notes (Signed)
Tolerated well

## 2015-12-02 NOTE — Patient Instructions (Signed)
..  [facility] Discharge Instructions for Patients Receiving Chemotherapy  Today you received the following chemotherapy agents opdivo    The clinic phone number is [phone]. Office hours are Monday-Friday 8:30am-5:00pm.  BELOW ARE SYMPTOMS THAT SHOULD BE REPORTED IMMEDIATELY:  *FEVER GREATER THAN 101.0 F  *CHILLS WITH OR WITHOUT FEVER  NAUSEA AND VOMITING THAT IS NOT CONTROLLED WITH YOUR NAUSEA MEDICATION  *UNUSUAL SHORTNESS OF BREATH  *UNUSUAL BRUISING OR BLEEDING  TENDERNESS IN MOUTH AND THROAT WITH OR WITHOUT PRESENCE OF ULCERS  *URINARY PROBLEMS  *BOWEL PROBLEMS  UNUSUAL RASH Items with * indicate a potential emergency and should be followed up as soon as possible. If you have an emergency after office hours please contact your primary care physician or go to the nearest emergency department.  Please call the clinic during office hours if you have any questions or concerns.   You may also contact the Patient Navigator at [phone] should you have any questions or need assistance in obtaining follow up care.

## 2015-12-13 NOTE — Assessment & Plan Note (Addendum)
Stage IV right adenocarcinoma pancoast tumor of the lung (EGFR and ALK negative) complicated by right brachial plexus neuralgia.  S/P XRT.  Currently on Nivolumab salvage therapy with Zometa for Hypercalcemia.  Oncology history is updated.  Due for treatment today. Pre-chemo labs are pending today.  TSH has been monitored and WNL most recently.  She continues with Nicotine patches.  She is down to smoking 3 cigarettes daily (compared to 5 daily in the past).  Smoking cessation encouraged.  ZOMETA on HOLD at this time in preparation for tooth extraction in March 2017.  Last given on 11/18/2015.  Return in 4 weeks for follow-up with continuation of pre-chemo labs and treatment every 2 weeks.  Repeat imaging in March 2017 for restaging will be ordered and scheduled at her next follow-up appointment.

## 2015-12-13 NOTE — Progress Notes (Signed)
Melissa Neighbors, MD [address] [place] [address]  Cancer of upper lobe of right lung [facility]) - Plan: Comprehensive metabolic panel, CBC with Differential  CURRENT THERAPY: Nivolumab every 2 weeks.  Zometa on hold in preparation for teeth extraction.  INTERVAL HISTORY: Melissa Sullivan 60 y.o. 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     Cancer of upper lobe of right lung ([place])   07/28/2014 Imaging CT chest: Large R apical mass consistent with malignancy. This is destroying the R 2nd rib with extension into adjacent soft tissue. R hilar adenopathy with R 5cm adrenal metastatic lesion.   08/01/2014 Initial Biopsy Lung, needle/core biopsy(ies), right upper lobe - POORLY DIFFERENTIATED ADENOCARCINOMA, SEE COMMENT.   08/08/2014 PET scan Large hypermetabolic R apical mass with evidence of direct chest wall and mediastinal invasion, right retrocrural lymphadenopathy, extensive retroperitoneal lymphadenopathy, and metastatic lesions to the adrenal glands    09/02/2014 - 11/04/2014 Chemotherapy Cisplatin/Pemetrexed/Avastin every 21 days x 4 cycles   10/07/2014 - 10/27/2014 Radiation Therapy Right lung apex for control of brachioplexopathy.   12/24/2014 - 02/25/2015 Chemotherapy Alimta/Avastin every 21 days.   02/20/2015 Imaging Increase in size of right adrenal metastasis and subjacent confluent retrocaval lymphadenopathy   02/25/2015 -  Chemotherapy Nivolumab, zometa   05/04/2015 Imaging CT CAP- Stable to slight decrease in the posterior right apical lesion. Stable appearance of posterior right upper rib an upper thoracic bony lesions. Slight improvement in right upper lobe tree-in-bud opacity. No new or progressive findings in...   07/28/2015 Imaging CT CAP- Reduced size of the right apical pleural parenchymal lesion and reduced size of the right adrenal metastatic lesion. Resolution  of prior retrocrural adenopathy.  Right eccentric T1 and T2 sclerosis with sclerosis and tapering of the right second..   11/17/2015 Imaging CT CAP- Stable soft tissue thickening in the apex of the right hemi thorax. Stable right adrenal metastasis. Nodularity along the trachea and mainstem bronchi, relatively new from 07/28/2015, favoring adherent debris.   11/24/2015 Treatment Plan Change Zometa on hold at this time in preparation for tooth extraction in March 2017.  Zometa las given on 11/18/2015.    I personally reviewed and went over laboratory results with the patient.  The results are noted within this dictation.  They are being updated today prior to immunotherapy administration.  Her labs today meet treatment parameters.  She denies any complaints.  I reviewed her labs with her today.  She denies any diarrhea or severe fatigue.  She notes that she os very active.  She continues with boost/ensure diet supplementation.  Her weight is stable.   Past Medical History  Diagnosis Date  . Hypertension   . Reflux   . Hyperlipidemia   . Lung mass 07/28/2014  . Adrenal mass, right ([place]) 07/28/2014  . Diabetes mellitus without complication ([place])   . Cancer [facility])     lung  right    has Adrenal mass, right ([place]); Cancer of upper lobe of right lung ([place]); ARF (acute renal failure) ([place]); Abnormal finding on imaging; Oropharyngeal dysphagia; Nausea and vomiting; Hypercalcemia; Anemia in neoplastic disease; Dysphagia; and Encounter for screening colonoscopy on her problem list.     has No Known Allergies.  Current Outpatient Prescriptions on File Prior to Visit  Medication Sig Dispense Refill  . Alum & Mag Hydroxide-Simeth (MAGIC MOUTHWASH W/LIDOCAINE) SOLN Take 5 mLs by  mouth 4 (four) times daily as needed for mouth pain. 400 mL 4  . azithromycin (ZITHROMAX) 250 MG tablet Take two tablets today and one tablet daily thereafter (Patient not taking: Reported on 09/23/2015) 11 each 0  .  Fluticasone-Salmeterol (ADVAIR DISKUS) 500-50 MCG/DOSE AEPB Two puffs at the same time daily. 60 each 6  . folic acid (FOLVITE) 1 MG tablet Take 1 mg by mouth every morning.    . hydrochlorothiazide (MICROZIDE) 12.5 MG capsule Take 12.5 mg by mouth every morning.     Marland Kitchen HYDROcodone-homatropine (HYCODAN) 5-1.5 MG/5ML syrup Take 5 mLs by mouth every 6 (six) hours as needed for cough. 200 mL 0  . ibuprofen (ADVIL,MOTRIN) 200 MG tablet Take 200-800 mg by mouth once as needed for headache or mild pain. Reported on 11/18/2015    . Iron-FA-B Cmp-C-Biot-Probiotic (FUSION PLUS) CAPS Take 1 capsule by mouth daily.     Marland Kitchen KLOR-CON M20 20 MEQ tablet TAKE ONE TABLET BY MOUTH TWICE DAILY 60 tablet 2  . lidocaine-prilocaine (EMLA) cream Apply a quarter size amount to port site 1 hour prior to chemo. Do not rub in. Cover with plastic wrap. 30 g 5  . megestrol (MEGACE) 40 MG/ML suspension Take 400 mg by mouth daily.     . metFORMIN (GLUCOPHAGE) 500 MG tablet TAKE ONE TABLET BY MOUTH TWICE DAILY WITH A MEAL 60 tablet 3  . metoCLOPramide (REGLAN) 10 MG tablet TAKE ONE TABLET BY MOUTH 4 TIMES DAILY 120 tablet 0  . nicotine (NICODERM CQ - DOSED IN MG/24 HOURS) 21 mg/24hr patch Place 1 patch onto the skin daily.  0  . Nivolumab (OPDIVO IV) Inject into the vein every 14 (fourteen) days.    Marland Kitchen omeprazole (PRILOSEC) 20 MG capsule Take 1 capsule (20 mg total) by mouth 2 (two) times daily before a meal. 60 capsule 11  . ondansetron (ZOFRAN) 8 MG tablet Take 1 tablet (8 mg total) by mouth every 8 (eight) hours as needed for nausea or vomiting. (Patient not taking: Reported on 09/23/2015) 60 tablet 1  . Oxycodone HCl 10 MG TABS Take 1-2 tablets (10-20 mg total) by mouth every 6 (six) hours as needed (pain.). 100 tablet 0  . predniSONE (DELTASONE) 10 MG tablet Prednisone 84m (8 tabs) by mouth daily x 3 days, then 765m(7 tabs) x 3 days, 6084m6 tabs) x 3 days, 38m61m tabs) x 3 days, 40mg4mtabs) x 3 days, 30mg 66mabs) x 3 days,  20mg (45mbs) x 3 days, 10mg (132m) x 3 days, 5mg (1/219mb) x 3 days. Start @ 1st sign of diarrhea. (Patient not taking: Reported on 07/29/2015) 120 tablet 0  . prochlorperazine (COMPAZINE) 10 MG tablet Starting the day after chemo, take 1 tab four times a day x 2 days. Then may take 1 tab four times a day IF needed for nausea/vomiting. 60 tablet 2  . senna (SENOKOT) 8.6 MG tablet Take 2 tablets by mouth at bedtime.     . simvastatin (ZOCOR) 40 MG tablet Take 40 mg by mouth every morning.     . triamcinolone (KENALOG) 0.025 % cream Apply 1 application topically 2 (two) times daily.     No current facility-administered medications on file prior to visit.    Past Surgical History  Procedure Laterality Date  . Appendectomy    . Lung biopsy Right 07/2014    CT guided  . Portacath placement Left 09/01/2014  . Esophagogastroduodenoscopy N/A 12/09/2014    SLF:distaFVC:BSWHQPal  stricture/mild-to-noderate erosive gastritis. negative H.pylori  . Savory dilation N/A 12/09/2014    Procedure: SAVORY DILATION;  Surgeon: [name], MD;  [facility];  Service: Endoscopy;  Laterality: N/A;  [name] dilation N/A 12/09/2014    Procedure: [name] DILATION;  Surgeon: [name], MD;  Location: [facility];  Service: Endoscopy;  Laterality: N/A;  . Flexible sigmoidoscopy  2011    Dr. [name]: hyperplastic polyp    Denies any headaches, dizziness, double vision, fevers, chills, night sweats, nausea, vomiting, diarrhea, constipation, chest pain, heart palpitations, shortness of breath, blood in stool, black tarry stool, urinary pain, urinary burning, urinary frequency, hematuria.   PHYSICAL EXAMINATION  ECOG PERFORMANCE STATUS: 1 - Symptomatic but completely ambulatory  There were no vitals filed for this visit.  GENERAL:alert, no distress, cachectic, comfortable, cooperative, smiling and accompanied by cousin, Fraser Din. SKIN: skin color, texture, turgor are normal, no rashes or significant  lesions HEAD: Normocephalic, No masses, lesions, tenderness or abnormalities EYES: normal, PERRLA, EOMI, Conjunctiva are pink and non-injected EARS: External ears normal OROPHARYNX:lips, buccal mucosa, and tongue normal and mucous membranes are moist  NECK: supple, no adenopathy, thyroid normal size, non-tender, without nodularity, no stridor, non-tender, trachea midline LYMPH:  no palpable lymphadenopathy, no hepatosplenomegaly BREAST:not examined LUNGS: clear to auscultation and percussion HEART: regular rate & rhythm, no murmurs, no gallops, S1 normal and S2 normal ABDOMEN:abdomen soft, non-tender, normal bowel sounds and no masses or organomegaly BACK: Back symmetric, no curvature., No CVA tenderness EXTREMITIES:less then 2 second capillary refill, no joint deformities, effusion, or inflammation, no edema, no skin discoloration, no cyanosis  NEURO: alert & oriented x 3 with fluent speech, no focal motor/sensory deficits, gait normal    LABORATORY DATA: CBC    Component Value Date/Time   WBC 6.0 12/16/2015 0959   RBC 3.73* 12/16/2015 0959   RBC 2.45* 01/15/2015 0901   HGB 11.7* 12/16/2015 0959   HCT 35.8* 12/16/2015 0959   PLT 236 12/16/2015 0959   MCV 96.0 12/16/2015 0959   MCH 31.4 12/16/2015 0959   MCHC 32.7 12/16/2015 0959   RDW 16.5* 12/16/2015 0959   LYMPHSABS 1.6 12/16/2015 0959   MONOABS 0.6 12/16/2015 0959   EOSABS 0.1 12/16/2015 0959   BASOSABS 0.0 12/16/2015 0959      Chemistry      Component Value Date/Time   NA 136 12/16/2015 0959   K 4.2 12/16/2015 0959   CL 106 12/16/2015 0959   CO2 24 12/16/2015 0959   BUN 12 12/16/2015 0959   CREATININE 1.44* 12/16/2015 0959      Component Value Date/Time   CALCIUM 9.7 12/16/2015 0959   CALCIUM 9.7 06/03/2015 1000   ALKPHOS 54 12/16/2015 0959   AST 16 12/16/2015 0959   ALT 7* 12/16/2015 0959   BILITOT 0.5 12/16/2015 0959     Lab Results  Component Value Date   TSH 2.322 11/04/2015     PENDING  LABS:   RADIOGRAPHIC STUDIES:  Ct Chest W Contrast  11/17/2015  CLINICAL DATA:  Right lung cancer, ongoing chemotherapy. Adrenal mass. EXAM: CT CHEST, ABDOMEN, AND PELVIS WITH CONTRAST TECHNIQUE: Multidetector CT imaging of the chest, abdomen and pelvis was performed following the standard protocol during bolus administration of intravenous contrast. CONTRAST:  11m OMNIPAQUE IOHEXOL 300 MG/ML  SOLN COMPARISON:  07/28/2015. FINDINGS: CT CHEST FINDINGS Mediastinum/Lymph Nodes: Left IJ Port-A-Cath terminates in the SVC. Mediastinal lymph nodes are not enlarged by CT size criteria. No hilar or axillary adenopathy. Atherosclerotic calcification of  the arterial vasculature. Heart size normal. No pericardial effusion. Lungs/Pleura: Mild to moderate centrilobular emphysema. Soft tissue thickening in the posteromedial aspect of the apical right hemi thorax appears stable. Mild mucoid impaction in the medial aspect of the left upper lobe. Minimal scarring in the right middle lobe and lingula. Lungs are otherwise clear. No pleural fluid. Nodularity within the trachea and mainstem bronchi is likely due to adherent debris, given relative lack of this finding on 07/28/2015. Musculoskeletal: Mild irregularity of the posterior right rib, stable, indicative of prior chest wall invasion. Sclerosis along the right aspects of the T1 and T2 vertebral bodies, unchanged, indicative of prior metastatic disease or treatment changes. CT ABDOMEN PELVIS FINDINGS Hepatobiliary: The liver and gallbladder are unremarkable. No biliary ductal dilatation. Pancreas: Negative. Spleen: Negative. Adrenals/Urinary Tract: Irregular right adrenal mass measures approximately 2.5 x 4.4 cm, when measured the same as on the prior exam, stable. Left adrenal gland and kidneys are unremarkable. Ureters are decompressed. Bladder is low in volume. Stomach/Bowel: Stomach, small bowel and colon are unremarkable. Appendectomy. Vascular/Lymphatic:  Atherosclerotic calcification of the arterial vasculature without abdominal aortic aneurysm. No pathologically enlarged lymph nodes. Reproductive: Uterus and ovaries are visualized. Other: Postoperative changes along the ventral right pelvic wall. No free fluid. Musculoskeletal: Well-circumscribed sclerotic lesion with lucent center in the left iliac wing, along the left sacroiliac joint, unchanged. IMPRESSION: 1. Stable soft tissue thickening in the apex of the right hemi thorax. Stable right adrenal metastasis. 2. Nodularity along the trachea and mainstem bronchi, relatively new from 07/28/2015, favoring adherent debris. Attention on followup exams is warranted. Electronically Signed   By: Lorin Picket M.D.   On: 11/17/2015 09:49   Ct Abdomen Pelvis W Contrast  11/17/2015  CLINICAL DATA:  Right lung cancer, ongoing chemotherapy. Adrenal mass. EXAM: CT CHEST, ABDOMEN, AND PELVIS WITH CONTRAST TECHNIQUE: Multidetector CT imaging of the chest, abdomen and pelvis was performed following the standard protocol during bolus administration of intravenous contrast. CONTRAST:  70m OMNIPAQUE IOHEXOL 300 MG/ML  SOLN COMPARISON:  07/28/2015. FINDINGS: CT CHEST FINDINGS Mediastinum/Lymph Nodes: Left IJ Port-A-Cath terminates in the SVC. Mediastinal lymph nodes are not enlarged by CT size criteria. No hilar or axillary adenopathy. Atherosclerotic calcification of the arterial vasculature. Heart size normal. No pericardial effusion. Lungs/Pleura: Mild to moderate centrilobular emphysema. Soft tissue thickening in the posteromedial aspect of the apical right hemi thorax appears stable. Mild mucoid impaction in the medial aspect of the left upper lobe. Minimal scarring in the right middle lobe and lingula. Lungs are otherwise clear. No pleural fluid. Nodularity within the trachea and mainstem bronchi is likely due to adherent debris, given relative lack of this finding on 07/28/2015. Musculoskeletal: Mild irregularity of  the posterior right rib, stable, indicative of prior chest wall invasion. Sclerosis along the right aspects of the T1 and T2 vertebral bodies, unchanged, indicative of prior metastatic disease or treatment changes. CT ABDOMEN PELVIS FINDINGS Hepatobiliary: The liver and gallbladder are unremarkable. No biliary ductal dilatation. Pancreas: Negative. Spleen: Negative. Adrenals/Urinary Tract: Irregular right adrenal mass measures approximately 2.5 x 4.4 cm, when measured the same as on the prior exam, stable. Left adrenal gland and kidneys are unremarkable. Ureters are decompressed. Bladder is low in volume. Stomach/Bowel: Stomach, small bowel and colon are unremarkable. Appendectomy. Vascular/Lymphatic: Atherosclerotic calcification of the arterial vasculature without abdominal aortic aneurysm. No pathologically enlarged lymph nodes. Reproductive: Uterus and ovaries are visualized. Other: Postoperative changes along the ventral right pelvic wall. No free fluid. Musculoskeletal: Well-circumscribed sclerotic lesion with lucent  center in the left iliac wing, along the left sacroiliac joint, unchanged. IMPRESSION: 1. Stable soft tissue thickening in the apex of the right hemi thorax. Stable right adrenal metastasis. 2. Nodularity along the trachea and mainstem bronchi, relatively new from 07/28/2015, favoring adherent debris. Attention on followup exams is warranted. Electronically Signed   By: Lorin Picket M.D.   On: 11/17/2015 09:49     PATHOLOGY:    ASSESSMENT AND PLAN:  Cancer of upper lobe of right lung Stage IV right adenocarcinoma pancoast tumor of the lung (EGFR and ALK negative) complicated by right brachial plexus neuralgia.  S/P XRT.  Currently on Nivolumab salvage therapy with Zometa for Hypercalcemia.  Oncology history is updated.  Due for treatment today. Pre-chemo labs are pending today.  TSH has been monitored and WNL most recently.  She continues with Nicotine patches.  She is down to  smoking 3 cigarettes daily (compared to 5 daily in the past).  Smoking cessation encouraged.  ZOMETA on HOLD at this time in preparation for tooth extraction in March 2017.  Last given on 11/18/2015.  Return in 4 weeks for follow-up with continuation of pre-chemo labs and treatment every 2 weeks.  Repeat imaging in March 2017 for restaging will be ordered and scheduled at her next follow-up appointment.     THERAPY PLAN:  Continue treatment as planned.  All questions were answered. The patient knows to call the clinic with any problems, questions or concerns. We can certainly see the patient much sooner if necessary.  Patient and plan discussed with Dr. Ancil Linsey and she is in agreement with the aforementioned.   This note is electronically signed by: Robynn Pane, PA-C 12/16/2015 11:11 AM

## 2015-12-16 ENCOUNTER — Encounter (HOSPITAL_BASED_OUTPATIENT_CLINIC_OR_DEPARTMENT_OTHER): Payer: 59

## 2015-12-16 ENCOUNTER — Ambulatory Visit (HOSPITAL_COMMUNITY): Payer: 59

## 2015-12-16 ENCOUNTER — Encounter (HOSPITAL_COMMUNITY): Payer: Self-pay | Admitting: Oncology

## 2015-12-16 ENCOUNTER — Encounter (HOSPITAL_BASED_OUTPATIENT_CLINIC_OR_DEPARTMENT_OTHER): Payer: 59 | Admitting: Oncology

## 2015-12-16 VITALS — BP 104/60 | HR 83 | Temp 98.0°F | Resp 18 | Wt 88.2 lb

## 2015-12-16 DIAGNOSIS — C3411 Malignant neoplasm of upper lobe, right bronchus or lung: Secondary | ICD-10-CM

## 2015-12-16 DIAGNOSIS — Z5112 Encounter for antineoplastic immunotherapy: Secondary | ICD-10-CM | POA: Diagnosis not present

## 2015-12-16 DIAGNOSIS — E278 Other specified disorders of adrenal gland: Secondary | ICD-10-CM

## 2015-12-16 LAB — COMPREHENSIVE METABOLIC PANEL
ALBUMIN: 4 g/dL (ref 3.5–5.0)
ALT: 7 U/L — ABNORMAL LOW (ref 14–54)
ANION GAP: 6 (ref 5–15)
AST: 16 U/L (ref 15–41)
Alkaline Phosphatase: 54 U/L (ref 38–126)
BILIRUBIN TOTAL: 0.5 mg/dL (ref 0.3–1.2)
BUN: 12 mg/dL (ref 6–20)
CHLORIDE: 106 mmol/L (ref 101–111)
CO2: 24 mmol/L (ref 22–32)
Calcium: 9.7 mg/dL (ref 8.9–10.3)
Creatinine, Ser: 1.44 mg/dL — ABNORMAL HIGH (ref 0.44–1.00)
GFR calc Af Amer: 45 mL/min — ABNORMAL LOW (ref >60)
GFR calc non Af Amer: 39 mL/min — ABNORMAL LOW (ref >60)
GLUCOSE: 107 mg/dL — AB (ref 65–99)
POTASSIUM: 4.2 mmol/L (ref 3.5–5.1)
SODIUM: 136 mmol/L (ref 135–145)
TOTAL PROTEIN: 7.6 g/dL (ref 6.5–8.1)

## 2015-12-16 LAB — CBC WITH DIFFERENTIAL/PLATELET
BASOS ABS: 0 10*3/uL (ref 0.0–0.1)
Basophils Relative: 0 %
Eosinophils Absolute: 0.1 10*3/uL (ref 0.0–0.7)
Eosinophils Relative: 2 %
HEMATOCRIT: 35.8 % — AB (ref 36.0–46.0)
Hemoglobin: 11.7 g/dL — ABNORMAL LOW (ref 12.0–15.0)
LYMPHS ABS: 1.6 10*3/uL (ref 0.7–4.0)
LYMPHS PCT: 26 %
MCH: 31.4 pg (ref 26.0–34.0)
MCHC: 32.7 g/dL (ref 30.0–36.0)
MCV: 96 fL (ref 78.0–100.0)
MONO ABS: 0.6 10*3/uL (ref 0.1–1.0)
Monocytes Relative: 10 %
NEUTROS ABS: 3.7 10*3/uL (ref 1.7–7.7)
Neutrophils Relative %: 62 %
Platelets: 236 10*3/uL (ref 150–400)
RBC: 3.73 MIL/uL — AB (ref 3.87–5.11)
RDW: 16.5 % — ABNORMAL HIGH (ref 11.5–15.5)
WBC: 6 10*3/uL (ref 4.0–10.5)

## 2015-12-16 MED ORDER — SODIUM CHLORIDE 0.9 % IV SOLN
240.0000 mg | Freq: Once | INTRAVENOUS | Status: AC
  Administered 2015-12-16: 240 mg via INTRAVENOUS
  Filled 2015-12-16: qty 8

## 2015-12-16 MED ORDER — HEPARIN SOD (PORK) LOCK FLUSH 100 UNIT/ML IV SOLN
500.0000 [IU] | Freq: Once | INTRAVENOUS | Status: AC | PRN
  Administered 2015-12-16: 500 [IU]

## 2015-12-16 MED ORDER — SODIUM CHLORIDE 0.9 % IJ SOLN
10.0000 mL | INTRAMUSCULAR | Status: DC | PRN
  Administered 2015-12-16: 10 mL
  Filled 2015-12-16: qty 10

## 2015-12-16 MED ORDER — HEPARIN SOD (PORK) LOCK FLUSH 100 UNIT/ML IV SOLN
INTRAVENOUS | Status: AC
  Filled 2015-12-16: qty 5

## 2015-12-16 MED ORDER — SODIUM CHLORIDE 0.9 % IV SOLN
Freq: Once | INTRAVENOUS | Status: AC
  Administered 2015-12-16: 10:00:00 via INTRAVENOUS

## 2015-12-16 NOTE — Progress Notes (Signed)
Patient tolerated infusion well.  VSS.  Patient was informed that the dietician had exhausted the number of cases of ensure that she was allowed, she verbalized understanding.

## 2015-12-16 NOTE — Patient Instructions (Signed)
[place] at [facility] Discharge Instructions  RECOMMENDATIONS MADE BY THE CONSULTANT AND ANY TEST RESULTS WILL BE SENT TO YOUR REFERRING PHYSICIAN.  Exam and discussion today with Kirby Crigler, PA. Nivolumab treatment today. Return as scheduled for treatments. No Zometa until after dental surgery. Office visit in 4 weeks.   Thank you for choosing [place] at [facility] to provide your oncology and hematology care.  To afford each patient quality time with our provider, please arrive at least 15 minutes before your scheduled appointment time.   Beginning January 23rd 2017 lab work for the [facility] will be done in the  Main lab at [facility] on 1st floor. If you have a lab appointment with the [place] please come in thru the  Main Entrance and check in at the main information desk  You need to re-schedule your appointment should you arrive 10 or more minutes late.  We strive to give you quality time with our providers, and arriving late affects you and other patients whose appointments are after yours.  Also, if you no show three or more times for appointments you may be dismissed from the clinic at the providers discretion.     Again, thank you for choosing [facility].  Our hope is that these requests will decrease the amount of time that you wait before being seen by our physicians.       _____________________________________________________________  Should you have questions after your visit to [facility], please contact our office at [phone] between the hours of 8:30 a.m. and 4:30 p.m.  Voicemails left after 4:30 p.m. will not be returned until the following business day.  For prescription refill requests, have your pharmacy contact our office.

## 2015-12-16 NOTE — Patient Instructions (Signed)
[facility] Discharge Instructions for Patients Receiving Chemotherapy   Beginning January 23rd 2017 lab work for the [facility] will be done in the  Main lab at [facility] on 1st floor. If you have a lab appointment with the [place] please come in thru the  Main Entrance and check in at the main information desk   Today you received the following chemotherapy agent: Opdivo. Please see MD visit AVS for additional information.     If you develop nausea and vomiting, or diarrhea that is not controlled by your medication, call the clinic.  The clinic phone number is [phone]. Office hours are Monday-Friday 8:30am-5:00pm.  BELOW ARE SYMPTOMS THAT SHOULD BE REPORTED IMMEDIATELY:  *FEVER GREATER THAN 101.0 F  *CHILLS WITH OR WITHOUT FEVER  NAUSEA AND VOMITING THAT IS NOT CONTROLLED WITH YOUR NAUSEA MEDICATION  *UNUSUAL SHORTNESS OF BREATH  *UNUSUAL BRUISING OR BLEEDING  TENDERNESS IN MOUTH AND THROAT WITH OR WITHOUT PRESENCE OF ULCERS  *URINARY PROBLEMS  *BOWEL PROBLEMS  UNUSUAL RASH Items with * indicate a potential emergency and should be followed up as soon as possible. If you have an emergency after office hours please contact your primary care physician or go to the nearest emergency department.  Please call the clinic during office hours if you have any questions or concerns.   You may also contact the Patient Navigator at [phone] should you have any questions or need assistance in obtaining follow up care.

## 2015-12-22 ENCOUNTER — Other Ambulatory Visit: Payer: Self-pay | Admitting: Gastroenterology

## 2015-12-30 ENCOUNTER — Encounter (HOSPITAL_COMMUNITY): Payer: 59 | Attending: Hematology and Oncology

## 2015-12-30 VITALS — BP 104/43 | HR 85 | Temp 98.4°F | Resp 16 | Wt 87.8 lb

## 2015-12-30 DIAGNOSIS — C3411 Malignant neoplasm of upper lobe, right bronchus or lung: Secondary | ICD-10-CM

## 2015-12-30 DIAGNOSIS — K219 Gastro-esophageal reflux disease without esophagitis: Secondary | ICD-10-CM | POA: Insufficient documentation

## 2015-12-30 DIAGNOSIS — E278 Other specified disorders of adrenal gland: Secondary | ICD-10-CM

## 2015-12-30 DIAGNOSIS — Z5112 Encounter for antineoplastic immunotherapy: Secondary | ICD-10-CM

## 2015-12-30 DIAGNOSIS — C7972 Secondary malignant neoplasm of left adrenal gland: Secondary | ICD-10-CM | POA: Diagnosis not present

## 2015-12-30 DIAGNOSIS — Z9089 Acquired absence of other organs: Secondary | ICD-10-CM | POA: Insufficient documentation

## 2015-12-30 DIAGNOSIS — C7989 Secondary malignant neoplasm of other specified sites: Secondary | ICD-10-CM | POA: Diagnosis not present

## 2015-12-30 DIAGNOSIS — F17210 Nicotine dependence, cigarettes, uncomplicated: Secondary | ICD-10-CM | POA: Diagnosis not present

## 2015-12-30 DIAGNOSIS — C7971 Secondary malignant neoplasm of right adrenal gland: Secondary | ICD-10-CM | POA: Insufficient documentation

## 2015-12-30 DIAGNOSIS — C781 Secondary malignant neoplasm of mediastinum: Secondary | ICD-10-CM | POA: Diagnosis not present

## 2015-12-30 DIAGNOSIS — I10 Essential (primary) hypertension: Secondary | ICD-10-CM | POA: Diagnosis not present

## 2015-12-30 DIAGNOSIS — E785 Hyperlipidemia, unspecified: Secondary | ICD-10-CM | POA: Diagnosis not present

## 2015-12-30 DIAGNOSIS — J449 Chronic obstructive pulmonary disease, unspecified: Secondary | ICD-10-CM | POA: Diagnosis not present

## 2015-12-30 DIAGNOSIS — C772 Secondary and unspecified malignant neoplasm of intra-abdominal lymph nodes: Secondary | ICD-10-CM | POA: Insufficient documentation

## 2015-12-30 DIAGNOSIS — Z79899 Other long term (current) drug therapy: Secondary | ICD-10-CM | POA: Insufficient documentation

## 2015-12-30 LAB — COMPREHENSIVE METABOLIC PANEL
ALBUMIN: 4.1 g/dL (ref 3.5–5.0)
ALK PHOS: 52 U/L (ref 38–126)
ALT: 8 U/L — AB (ref 14–54)
AST: 17 U/L (ref 15–41)
Anion gap: 8 (ref 5–15)
BUN: 20 mg/dL (ref 6–20)
CALCIUM: 10.5 mg/dL — AB (ref 8.9–10.3)
CO2: 25 mmol/L (ref 22–32)
CREATININE: 1.52 mg/dL — AB (ref 0.44–1.00)
Chloride: 101 mmol/L (ref 101–111)
GFR calc Af Amer: 42 mL/min — ABNORMAL LOW (ref >60)
GFR calc non Af Amer: 36 mL/min — ABNORMAL LOW (ref >60)
GLUCOSE: 126 mg/dL — AB (ref 65–99)
Potassium: 4.5 mmol/L (ref 3.5–5.1)
SODIUM: 134 mmol/L — AB (ref 135–145)
Total Bilirubin: 0.4 mg/dL (ref 0.3–1.2)
Total Protein: 7.7 g/dL (ref 6.5–8.1)

## 2015-12-30 LAB — CBC WITH DIFFERENTIAL/PLATELET
BASOS PCT: 0 %
Basophils Absolute: 0 10*3/uL (ref 0.0–0.1)
EOS ABS: 0.2 10*3/uL (ref 0.0–0.7)
Eosinophils Relative: 2 %
HCT: 34.8 % — ABNORMAL LOW (ref 36.0–46.0)
HEMOGLOBIN: 11.5 g/dL — AB (ref 12.0–15.0)
Lymphocytes Relative: 28 %
Lymphs Abs: 2.1 10*3/uL (ref 0.7–4.0)
MCH: 31.6 pg (ref 26.0–34.0)
MCHC: 33 g/dL (ref 30.0–36.0)
MCV: 95.6 fL (ref 78.0–100.0)
MONO ABS: 0.6 10*3/uL (ref 0.1–1.0)
MONOS PCT: 8 %
NEUTROS PCT: 62 %
Neutro Abs: 4.7 10*3/uL (ref 1.7–7.7)
Platelets: 309 10*3/uL (ref 150–400)
RBC: 3.64 MIL/uL — AB (ref 3.87–5.11)
RDW: 16.1 % — ABNORMAL HIGH (ref 11.5–15.5)
WBC: 7.7 10*3/uL (ref 4.0–10.5)

## 2015-12-30 MED ORDER — SODIUM CHLORIDE 0.9 % IV SOLN
240.0000 mg | Freq: Once | INTRAVENOUS | Status: AC
  Administered 2015-12-30: 240 mg via INTRAVENOUS
  Filled 2015-12-30: qty 20

## 2015-12-30 MED ORDER — HEPARIN SOD (PORK) LOCK FLUSH 100 UNIT/ML IV SOLN
500.0000 [IU] | Freq: Once | INTRAVENOUS | Status: AC | PRN
  Administered 2015-12-30: 500 [IU]
  Filled 2015-12-30: qty 5

## 2015-12-30 MED ORDER — SODIUM CHLORIDE 0.9 % IJ SOLN
10.0000 mL | INTRAMUSCULAR | Status: DC | PRN
  Administered 2015-12-30: 10 mL
  Filled 2015-12-30: qty 10

## 2015-12-30 MED ORDER — SODIUM CHLORIDE 0.9 % IV SOLN
Freq: Once | INTRAVENOUS | Status: AC
  Administered 2015-12-30: 14:00:00 via INTRAVENOUS

## 2015-12-30 NOTE — Progress Notes (Signed)
Patient tolerated infusion well.  VSS.  Patient requested that she talk to [facility], dietician, about getting more Ensure since it was the beginning of a new year.  I called [name] and he stated that she was no longer eligible to have any Ensure because she was eating fine and maintaining weight.  He did state that he would come speak to the patient during her next visit to the clinic.  Patient is aware and states that she will talk to SW about the situation.

## 2015-12-30 NOTE — Patient Instructions (Signed)
[facility] [place] Discharge Instructions for Patients Receiving Chemotherapy   Beginning January 23rd 2017 lab work for the [facility] will be done in the  Main lab at [facility] on 1st floor. If you have a lab appointment with the [place] please come in thru the  Main Entrance and check in at the main information desk   Today you received the following chemotherapy agent: Opdivo.   Please return as scheduled.   [name] is aware that you would like to have more Ensure and he will discuss this with you at the next visit to the clinic.     If you develop nausea and vomiting, or diarrhea that is not controlled by your medication, call the clinic.  The clinic phone number is [phone]. Office hours are Monday-Friday 8:30am-5:00pm.  BELOW ARE SYMPTOMS THAT SHOULD BE REPORTED IMMEDIATELY:  *FEVER GREATER THAN 101.0 F  *CHILLS WITH OR WITHOUT FEVER  NAUSEA AND VOMITING THAT IS NOT CONTROLLED WITH YOUR NAUSEA MEDICATION  *UNUSUAL SHORTNESS OF BREATH  *UNUSUAL BRUISING OR BLEEDING  TENDERNESS IN MOUTH AND THROAT WITH OR WITHOUT PRESENCE OF ULCERS  *URINARY PROBLEMS  *BOWEL PROBLEMS  UNUSUAL RASH Items with * indicate a potential emergency and should be followed up as soon as possible. If you have an emergency after office hours please contact your primary care physician or go to the nearest emergency department.  Please call the clinic during office hours if you have any questions or concerns.   You may also contact the Patient Navigator at [phone] should you have any questions or need assistance in obtaining follow up care.

## 2016-01-13 ENCOUNTER — Encounter (HOSPITAL_BASED_OUTPATIENT_CLINIC_OR_DEPARTMENT_OTHER): Payer: 59

## 2016-01-13 ENCOUNTER — Encounter (HOSPITAL_COMMUNITY): Payer: Self-pay | Admitting: Oncology

## 2016-01-13 ENCOUNTER — Encounter (HOSPITAL_BASED_OUTPATIENT_CLINIC_OR_DEPARTMENT_OTHER): Payer: 59 | Admitting: Oncology

## 2016-01-13 VITALS — BP 120/78 | HR 90 | Temp 98.3°F | Resp 20 | Wt 85.9 lb

## 2016-01-13 VITALS — BP 112/58 | HR 82 | Temp 98.3°F | Resp 18

## 2016-01-13 DIAGNOSIS — Z720 Tobacco use: Secondary | ICD-10-CM

## 2016-01-13 DIAGNOSIS — C3411 Malignant neoplasm of upper lobe, right bronchus or lung: Secondary | ICD-10-CM

## 2016-01-13 DIAGNOSIS — Z5112 Encounter for antineoplastic immunotherapy: Secondary | ICD-10-CM

## 2016-01-13 DIAGNOSIS — E278 Other specified disorders of adrenal gland: Secondary | ICD-10-CM

## 2016-01-13 LAB — CBC WITH DIFFERENTIAL/PLATELET
BASOS ABS: 0 10*3/uL (ref 0.0–0.1)
BASOS PCT: 0 %
Eosinophils Absolute: 0.2 10*3/uL (ref 0.0–0.7)
Eosinophils Relative: 2 %
HEMATOCRIT: 34.5 % — AB (ref 36.0–46.0)
Hemoglobin: 11.5 g/dL — ABNORMAL LOW (ref 12.0–15.0)
Lymphocytes Relative: 32 %
Lymphs Abs: 2.3 10*3/uL (ref 0.7–4.0)
MCH: 31.6 pg (ref 26.0–34.0)
MCHC: 33.3 g/dL (ref 30.0–36.0)
MCV: 94.8 fL (ref 78.0–100.0)
MONO ABS: 0.5 10*3/uL (ref 0.1–1.0)
Monocytes Relative: 7 %
NEUTROS ABS: 4.4 10*3/uL (ref 1.7–7.7)
Neutrophils Relative %: 59 %
PLATELETS: 288 10*3/uL (ref 150–400)
RBC: 3.64 MIL/uL — ABNORMAL LOW (ref 3.87–5.11)
RDW: 15.8 % — AB (ref 11.5–15.5)
WBC: 7.4 10*3/uL (ref 4.0–10.5)

## 2016-01-13 LAB — COMPREHENSIVE METABOLIC PANEL
ALBUMIN: 4.2 g/dL (ref 3.5–5.0)
ALT: 7 U/L — ABNORMAL LOW (ref 14–54)
AST: 15 U/L (ref 15–41)
Alkaline Phosphatase: 49 U/L (ref 38–126)
Anion gap: 7 (ref 5–15)
BUN: 16 mg/dL (ref 6–20)
CHLORIDE: 102 mmol/L (ref 101–111)
CO2: 26 mmol/L (ref 22–32)
Calcium: 10.2 mg/dL (ref 8.9–10.3)
Creatinine, Ser: 1.47 mg/dL — ABNORMAL HIGH (ref 0.44–1.00)
GFR calc Af Amer: 44 mL/min — ABNORMAL LOW (ref >60)
GFR calc non Af Amer: 38 mL/min — ABNORMAL LOW (ref >60)
GLUCOSE: 96 mg/dL (ref 65–99)
POTASSIUM: 3.8 mmol/L (ref 3.5–5.1)
Sodium: 135 mmol/L (ref 135–145)
Total Bilirubin: 0.4 mg/dL (ref 0.3–1.2)
Total Protein: 7.8 g/dL (ref 6.5–8.1)

## 2016-01-13 MED ORDER — SODIUM CHLORIDE 0.9 % IV SOLN
Freq: Once | INTRAVENOUS | Status: AC
  Administered 2016-01-13: 13:00:00 via INTRAVENOUS

## 2016-01-13 MED ORDER — OXYCODONE HCL 10 MG PO TABS: 10.0000 mg | ORAL_TABLET | Freq: Four times a day (QID) | ORAL | Status: DC | PRN

## 2016-01-13 MED ORDER — SODIUM CHLORIDE 0.9 % IJ SOLN
10.0000 mL | INTRAMUSCULAR | Status: DC | PRN
  Administered 2016-01-13: 10 mL
  Filled 2016-01-13: qty 10

## 2016-01-13 MED ORDER — HEPARIN SOD (PORK) LOCK FLUSH 100 UNIT/ML IV SOLN
500.0000 [IU] | Freq: Once | INTRAVENOUS | Status: AC | PRN
  Administered 2016-01-13: 500 [IU]

## 2016-01-13 MED ORDER — NIVOLUMAB CHEMO INJECTION 100 MG/10ML
240.0000 mg | Freq: Once | INTRAVENOUS | Status: AC
  Administered 2016-01-13: 240 mg via INTRAVENOUS
  Filled 2016-01-13: qty 4

## 2016-01-13 NOTE — Progress Notes (Signed)
Melissa Neighbors, MD [address] [place] [address]  Cancer of upper lobe of right lung [facility]) - Plan: CBC with Differential, Comprehensive metabolic panel, TSH, CT Abdomen Pelvis W Contrast, CT Chest W Contrast, Oxycodone HCl 10 MG TABS  CURRENT THERAPY: Nivolumab every 2 weeks.  Zometa on hold in preparation for teeth extraction.  INTERVAL HISTORY: Melissa Sullivan 60 y.o. 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     Cancer of upper lobe of right lung ([place])   07/28/2014 Imaging CT chest: Large R apical mass consistent with malignancy. This is destroying the R 2nd rib with extension into adjacent soft tissue. R hilar adenopathy with R 5cm adrenal metastatic lesion.   08/01/2014 Initial Biopsy Lung, needle/core biopsy(ies), right upper lobe - POORLY DIFFERENTIATED ADENOCARCINOMA, SEE COMMENT.   08/08/2014 PET scan Large hypermetabolic R apical mass with evidence of direct chest wall and mediastinal invasion, right retrocrural lymphadenopathy, extensive retroperitoneal lymphadenopathy, and metastatic lesions to the adrenal glands    09/02/2014 - 11/04/2014 Chemotherapy Cisplatin/Pemetrexed/Avastin every 21 days x 4 cycles   10/07/2014 - 10/27/2014 Radiation Therapy Right lung apex for control of brachioplexopathy.   12/24/2014 - 02/25/2015 Chemotherapy Alimta/Avastin every 21 days.   02/20/2015 Imaging Increase in size of right adrenal metastasis and subjacent confluent retrocaval lymphadenopathy   02/25/2015 -  Chemotherapy Nivolumab, zometa   05/04/2015 Imaging CT CAP- Stable to slight decrease in the posterior right apical lesion. Stable appearance of posterior right upper rib an upper thoracic bony lesions. Slight improvement in right upper lobe tree-in-bud opacity. No new or progressive findings in...   07/28/2015 Imaging CT CAP- Reduced size of the right apical pleural  parenchymal lesion and reduced size of the right adrenal metastatic lesion. Resolution of prior retrocrural adenopathy.  Right eccentric T1 and T2 sclerosis with sclerosis and tapering of the right second..   11/17/2015 Imaging CT CAP- Stable soft tissue thickening in the apex of the right hemi thorax. Stable right adrenal metastasis. Nodularity along the trachea and mainstem bronchi, relatively new from 07/28/2015, favoring adherent debris.   11/24/2015 Treatment Plan Change Zometa on hold at this time in preparation for tooth extraction in March 2017.  Zometa las given on 11/18/2015.    I personally reviewed and went over laboratory results with the patient.  The results are noted within this dictation.  They are being updated today prior to immunotherapy administration.   She denies any complaints.  I reviewed her labs with her today.  She denies any diarrhea or severe fatigue.  She notes that she os very active.  She continues with boost/ensure diet supplementation.  Her weight is down To 85.9 pounds compared to 87.8 pounds on 12/30/2015, compared to 89 pounds on 11/18/2015. She reports her appetite has declined. She notes that her appetite was much improved when she was utilizing ensure/boost to help supplement her diet. She's been in discussions with [name] regarding free supplementation. I provided her a prescription for ensure.  She requests a refill on her pain medication. This is provided to her today as well.  She scheduled for tooth extraction on March 6 with her dentist. I provided a letter for her to have. The last time she received zoledronic acid was on 11/18/2015. We've been holding it in preparation for tooth extraction. She is also scheduled for a podiatrist appointment on March 7.  We will restart zoledronic acid after she's healed from her tooth extraction.  She denies any hemoptysis, shortness of breath, dyspnea, new pains, or any other complaints.   Past Medical History    Diagnosis Date  . Hypertension   . Reflux   . Hyperlipidemia   . Lung mass 07/28/2014  . Adrenal mass, right ([place]) 07/28/2014  . Diabetes mellitus without complication ([place])   . Cancer [facility])     lung  right    has Adrenal mass, right ([place]); Cancer of upper lobe of right lung ([place]); ARF (acute renal failure) ([place]); Abnormal finding on imaging; Oropharyngeal dysphagia; Nausea and vomiting; Hypercalcemia; Anemia in neoplastic disease; Dysphagia; and Encounter for screening colonoscopy on her problem list.     has No Known Allergies.  Current Outpatient Prescriptions on File Prior to Visit  Medication Sig Dispense Refill  . Alum & Mag Hydroxide-Simeth (MAGIC MOUTHWASH W/LIDOCAINE) SOLN Take 5 mLs by mouth 4 (four) times daily as needed for mouth pain. 400 mL 4  . Fluticasone-Salmeterol (ADVAIR DISKUS) 500-50 MCG/DOSE AEPB Two puffs at the same time daily. 60 each 6  . folic acid (FOLVITE) 1 MG tablet Take 1 mg by mouth every morning.    . hydrochlorothiazide (MICROZIDE) 12.5 MG capsule Take 12.5 mg by mouth every morning.     Marland Kitchen ibuprofen (ADVIL,MOTRIN) 200 MG tablet Take 200-800 mg by mouth once as needed for headache or mild pain. Reported on 11/18/2015    . Iron-FA-B Cmp-C-Biot-Probiotic (FUSION PLUS) CAPS Take 1 capsule by mouth daily.     Marland Kitchen KLOR-CON M20 20 MEQ tablet TAKE ONE TABLET BY MOUTH TWICE DAILY 60 tablet 2  . lidocaine-prilocaine (EMLA) cream Apply a quarter size amount to port site 1 hour prior to chemo. Do not rub in. Cover with plastic wrap. 30 g 5  . megestrol (MEGACE) 40 MG/ML suspension Take 400 mg by mouth daily.     . metFORMIN (GLUCOPHAGE) 500 MG tablet TAKE ONE TABLET BY MOUTH TWICE DAILY WITH A MEAL 60 tablet 3  . metoCLOPramide (REGLAN) 10 MG tablet TAKE ONE TABLET BY MOUTH 4 TIMES DAILY 120 tablet 0  . nicotine (NICODERM CQ - DOSED IN MG/24 HOURS) 21 mg/24hr patch Place 1 patch onto the skin daily.  0  . Nivolumab (OPDIVO IV) Inject into the vein every 14 (fourteen)  days.    . prochlorperazine (COMPAZINE) 10 MG tablet Starting the day after chemo, take 1 tab four times a day x 2 days. Then may take 1 tab four times a day IF needed for nausea/vomiting. 60 tablet 2  . senna (SENOKOT) 8.6 MG tablet Take 2 tablets by mouth at bedtime.     . simvastatin (ZOCOR) 40 MG tablet Take 40 mg by mouth every morning.     . triamcinolone (KENALOG) 0.025 % cream Apply 1 application topically 2 (two) times daily.    Marland Kitchen HYDROcodone-homatropine (HYCODAN) 5-1.5 MG/5ML syrup Take 5 mLs by mouth every 6 (six) hours as needed for cough. (Patient not taking: Reported on 01/13/2016) 200 mL 0  . omeprazole (PRILOSEC) 20 MG capsule TAKE ONE CAPSULE BY MOUTH TWICE DAILYBEFORE A MEAL 60 capsule 11  . ondansetron (ZOFRAN) 8 MG tablet Take 1 tablet (8 mg total) by mouth every 8 (eight) hours as needed for nausea or vomiting. (Patient not taking: Reported on 09/23/2015) 60 tablet 1   No current facility-administered medications on file prior to visit.    Past Surgical History  Procedure Laterality Date  . Appendectomy    .  Lung biopsy Right 07/2014    CT guided  . Portacath placement Left 09/01/2014  . Esophagogastroduodenoscopy N/A 12/09/2014    MIW:OEHOZY esophageal stricture/mild-to-noderate erosive gastritis. negative H.pylori  . Savory dilation N/A 12/09/2014    Procedure: SAVORY DILATION;  Surgeon: [name], MD;  [facility];  Service: Endoscopy;  Laterality: N/A;  [name] dilation N/A 12/09/2014    Procedure: [name] DILATION;  Surgeon: [name], MD;  Location: [facility];  Service: Endoscopy;  Laterality: N/A;  . Flexible sigmoidoscopy  2011    Dr. [name]: hyperplastic polyp    Denies any headaches, dizziness, double vision, fevers, chills, night sweats, nausea, vomiting, diarrhea, constipation, chest pain, heart palpitations, shortness of breath, blood in stool, black tarry stool, urinary pain, urinary burning, urinary frequency, hematuria.     PHYSICAL EXAMINATION  ECOG PERFORMANCE STATUS: 1 - Symptomatic but completely ambulatory  Filed Vitals:   01/13/16 1030  BP: 120/78  Pulse: 90  Temp: 98.3 F (36.8 C)  Resp: 20    GENERAL:alert, no distress, cachectic, comfortable, cooperative, smiling and accompanied by Her daughter and grandson, [name]. SKIN: skin color, texture, turgor are normal, no rashes or significant lesions HEAD: Normocephalic, No masses, lesions, tenderness or abnormalities EYES: normal, PERRLA, EOMI, Conjunctiva are pink and non-injected EARS: External ears normal OROPHARYNX:lips, buccal mucosa, and tongue normal and mucous membranes are moist, poor dentition NECK: supple, no adenopathy, thyroid normal size, non-tender, without nodularity, no stridor, non-tender, trachea midline LYMPH:  no palpable lymphadenopathy, no hepatosplenomegaly BREAST:not examined LUNGS: clear to auscultation and percussion without wheezes, rales, or rhonchi. HEART: regular rate & rhythm, no murmurs, no gallops, S1 normal and S2 normal ABDOMEN:abdomen soft, non-tender, normal bowel sounds and no masses or organomegaly BACK: Back symmetric, no curvature., No CVA tenderness EXTREMITIES:less then 2 second capillary refill, no joint deformities, effusion, or inflammation, no edema, no skin discoloration, no cyanosis  NEURO: alert & oriented x 3 with fluent speech, no focal motor/sensory deficits, gait normal    LABORATORY DATA: CBC    Component Value Date/Time   WBC 7.4 01/13/2016 1142   RBC 3.64* 01/13/2016 1142   RBC 2.45* 01/15/2015 0901   HGB 11.5* 01/13/2016 1142   HCT 34.5* 01/13/2016 1142   PLT 288 01/13/2016 1142   MCV 94.8 01/13/2016 1142   MCH 31.6 01/13/2016 1142   MCHC 33.3 01/13/2016 1142   RDW 15.8* 01/13/2016 1142   LYMPHSABS 2.3 01/13/2016 1142   MONOABS 0.5 01/13/2016 1142   EOSABS 0.2 01/13/2016 1142   BASOSABS 0.0 01/13/2016 1142      Chemistry      Component Value Date/Time   NA 135  01/13/2016 1142   K 3.8 01/13/2016 1142   CL 102 01/13/2016 1142   CO2 26 01/13/2016 1142   BUN 16 01/13/2016 1142   CREATININE 1.47* 01/13/2016 1142      Component Value Date/Time   CALCIUM 10.2 01/13/2016 1142   CALCIUM 9.7 06/03/2015 1000   ALKPHOS 49 01/13/2016 1142   AST 15 01/13/2016 1142   ALT 7* 01/13/2016 1142   BILITOT 0.4 01/13/2016 1142     Lab Results  Component Value Date   TSH 2.322 11/04/2015     PENDING LABS:   RADIOGRAPHIC STUDIES:  No results found.   PATHOLOGY:    ASSESSMENT AND PLAN:  Cancer of upper lobe of right lung Stage IV right adenocarcinoma pancoast tumor of the lung (EGFR and ALK negative) complicated by right brachial plexus neuralgia.  S/P XRT.  Currently on Nivolumab salvage therapy with Zometa for Hypercalcemia (on hold presently in preparation for dental surgery).  Oncology history is updated.  Due for treatment today. Pre-chemo labs are pending today.  TSH has been monitored and WNL most recently.  She continues to smoke.  She is down to smoking 3-4 cigarettes daily (compared to 5 daily in the past).  Smoking cessation encouraged.  ZOMETA on HOLD at this time in preparation for tooth extraction in March 2017.  Last given on 11/18/2015.  Calcium is climbing and discussed with patient to weeks ago when she was here for her previous infusion. We'll need to continue to follow closely.  I refilled her pain medicine today. Also given a prescription for ensure.  Weight loss is noted. She is down 3 pounds compared to December 2016. We will need to monitor closely. She notes a decrease in appetite.  Oncology standpoint, she is asymptomatic otherwise.  Orders place for CT chest, abdomen, and pelvis for restaging purposes. These will be scheduled in March 2017. Results will be discussed in follow-up visit and the patient does not want to know when scheduled. We will let her daughter know when these images are scheduled. The patient has issues  with anxiety associated with scheduling of CT scans.  She is given a letter that she can get to her dentist in preparation for tooth extraction. She reports that she scheduled for tooth extraction on March 6. She also has an appointment with podiatrist on March 7.  Return in 4 weeks for follow-up with continuation of pre-chemo labs and treatment every 2 weeks. When she returns, we will review her restaging imaging.    THERAPY PLAN:  Continue treatment as planned.  All questions were answered. The patient knows to call the clinic with any problems, questions or concerns. We can certainly see the patient much sooner if necessary.  Patient and plan discussed with Dr. Ancil Linsey and she is in agreement with the aforementioned.   This note is electronically signed by: Doy Mince 01/13/2016 12:59 PM

## 2016-01-13 NOTE — Patient Instructions (Signed)
[place] at [facility] Discharge Instructions  RECOMMENDATIONS MADE BY THE CONSULTANT AND ANY TEST RESULTS WILL BE SENT TO YOUR REFERRING PHYSICIAN.  Exam and discussion today with Kirby Crigler, PA. Prescription for Ensure sent to your pharmacy. Oxycodone refilled. CT scans as scheduled. HOLD Zometa. Opdivo infusion today. Return as scheduled for treatment. Office visit with Dr. Whitney Muse in 4 weeks.    Thank you for choosing [place] at [facility] to provide your oncology and hematology care.  To afford each patient quality time with our provider, please arrive at least 15 minutes before your scheduled appointment time.   Beginning January 23rd 2017 lab work for the [facility] will be done in the  Main lab at [facility] on 1st floor. If you have a lab appointment with the [place] please come in thru the  Main Entrance and check in at the main information desk  You need to re-schedule your appointment should you arrive 10 or more minutes late.  We strive to give you quality time with our providers, and arriving late affects you and other patients whose appointments are after yours.  Also, if you no show three or more times for appointments you may be dismissed from the clinic at the providers discretion.     Again, thank you for choosing [facility].  Our hope is that these requests will decrease the amount of time that you wait before being seen by our physicians.       _____________________________________________________________  Should you have questions after your visit to [facility], please contact our office at [phone] between the hours of 8:30 a.m. and 4:30 p.m.  Voicemails left after 4:30 p.m. will not be returned until the following business day.  For prescription refill requests, have your pharmacy contact our office.

## 2016-01-13 NOTE — Progress Notes (Signed)
Patient tolerated infusion well.  VSS.   

## 2016-01-13 NOTE — Patient Instructions (Signed)
[facility] Discharge Instructions for Patients Receiving Chemotherapy   Beginning January 23rd 2017 lab work for the [facility] will be done in the  Main lab at [facility] on 1st floor. If you have a lab appointment with the [facility] please come in thru the  Main Entrance and check in at the main information desk   Today you received the following chemotherapy agent: Opdivo. Please see the MD appointment AVS for more information.     If you develop nausea and vomiting, or diarrhea that is not controlled by your medication, call the clinic.  The clinic phone number is [phone]. Office hours are Monday-Friday 8:30am-5:00pm.  BELOW ARE SYMPTOMS THAT SHOULD BE REPORTED IMMEDIATELY:  *FEVER GREATER THAN 101.0 F  *CHILLS WITH OR WITHOUT FEVER  NAUSEA AND VOMITING THAT IS NOT CONTROLLED WITH YOUR NAUSEA MEDICATION  *UNUSUAL SHORTNESS OF BREATH  *UNUSUAL BRUISING OR BLEEDING  TENDERNESS IN MOUTH AND THROAT WITH OR WITHOUT PRESENCE OF ULCERS  *URINARY PROBLEMS  *BOWEL PROBLEMS  UNUSUAL RASH Items with * indicate a potential emergency and should be followed up as soon as possible. If you have an emergency after office hours please contact your primary care physician or go to the nearest emergency department.  Please call the clinic during office hours if you have any questions or concerns.   You may also contact the Patient Navigator at [phone] should you have any questions or need assistance in obtaining follow up care.

## 2016-01-13 NOTE — Assessment & Plan Note (Addendum)
Stage IV right adenocarcinoma pancoast tumor of the lung (EGFR and ALK negative) complicated by right brachial plexus neuralgia.  S/P XRT.  Currently on Nivolumab salvage therapy with Zometa for Hypercalcemia (on hold presently in preparation for dental surgery).  Oncology history is updated.  Due for treatment today. Pre-chemo labs are pending today.  TSH has been monitored and WNL most recently.  She continues to smoke.  She is down to smoking 3-4 cigarettes daily (compared to 5 daily in the past).  Smoking cessation encouraged.  ZOMETA on HOLD at this time in preparation for tooth extraction in March 2017.  Last given on 11/18/2015.  Calcium is climbing and discussed with patient to weeks ago when she was here for her previous infusion. We'll need to continue to follow closely.  I refilled her pain medicine today. Also given a prescription for ensure.  Weight loss is noted. She is down 3 pounds compared to December 2016. We will need to monitor closely. She notes a decrease in appetite.  Oncology standpoint, she is asymptomatic otherwise.  Orders place for CT chest, abdomen, and pelvis for restaging purposes. These will be scheduled in March 2017. Results will be discussed in follow-up visit and the patient does not want to know when scheduled. We will let her daughter know when these images are scheduled. The patient has issues with anxiety associated with scheduling of CT scans.  She is given a letter that she can get to her dentist in preparation for tooth extraction. She reports that she scheduled for tooth extraction on March 6. She also has an appointment with podiatrist on March 7.  Return in 4 weeks for follow-up with continuation of pre-chemo labs and treatment every 2 weeks. When she returns, we will review her restaging imaging.

## 2016-01-27 ENCOUNTER — Encounter (HOSPITAL_COMMUNITY): Payer: 59 | Attending: Hematology and Oncology

## 2016-01-27 ENCOUNTER — Other Ambulatory Visit (HOSPITAL_COMMUNITY): Payer: Self-pay | Admitting: Oncology

## 2016-01-27 ENCOUNTER — Encounter (HOSPITAL_COMMUNITY): Payer: Self-pay

## 2016-01-27 VITALS — BP 99/57 | HR 87 | Temp 98.4°F | Resp 18 | Wt 89.0 lb

## 2016-01-27 DIAGNOSIS — C7989 Secondary malignant neoplasm of other specified sites: Secondary | ICD-10-CM | POA: Diagnosis not present

## 2016-01-27 DIAGNOSIS — C7971 Secondary malignant neoplasm of right adrenal gland: Secondary | ICD-10-CM | POA: Diagnosis not present

## 2016-01-27 DIAGNOSIS — E278 Other specified disorders of adrenal gland: Secondary | ICD-10-CM

## 2016-01-27 DIAGNOSIS — F17210 Nicotine dependence, cigarettes, uncomplicated: Secondary | ICD-10-CM | POA: Diagnosis not present

## 2016-01-27 DIAGNOSIS — I10 Essential (primary) hypertension: Secondary | ICD-10-CM | POA: Diagnosis not present

## 2016-01-27 DIAGNOSIS — Z9089 Acquired absence of other organs: Secondary | ICD-10-CM | POA: Diagnosis not present

## 2016-01-27 DIAGNOSIS — C772 Secondary and unspecified malignant neoplasm of intra-abdominal lymph nodes: Secondary | ICD-10-CM | POA: Insufficient documentation

## 2016-01-27 DIAGNOSIS — Z79899 Other long term (current) drug therapy: Secondary | ICD-10-CM | POA: Insufficient documentation

## 2016-01-27 DIAGNOSIS — C781 Secondary malignant neoplasm of mediastinum: Secondary | ICD-10-CM | POA: Diagnosis not present

## 2016-01-27 DIAGNOSIS — J449 Chronic obstructive pulmonary disease, unspecified: Secondary | ICD-10-CM | POA: Diagnosis not present

## 2016-01-27 DIAGNOSIS — E785 Hyperlipidemia, unspecified: Secondary | ICD-10-CM | POA: Diagnosis not present

## 2016-01-27 DIAGNOSIS — Z5112 Encounter for antineoplastic immunotherapy: Secondary | ICD-10-CM

## 2016-01-27 DIAGNOSIS — K219 Gastro-esophageal reflux disease without esophagitis: Secondary | ICD-10-CM | POA: Insufficient documentation

## 2016-01-27 DIAGNOSIS — C7972 Secondary malignant neoplasm of left adrenal gland: Secondary | ICD-10-CM | POA: Insufficient documentation

## 2016-01-27 DIAGNOSIS — D630 Anemia in neoplastic disease: Secondary | ICD-10-CM

## 2016-01-27 DIAGNOSIS — C3411 Malignant neoplasm of upper lobe, right bronchus or lung: Secondary | ICD-10-CM | POA: Diagnosis present

## 2016-01-27 LAB — CBC WITH DIFFERENTIAL/PLATELET
BASOS ABS: 0 10*3/uL (ref 0.0–0.1)
Basophils Relative: 0 %
EOS ABS: 0.2 10*3/uL (ref 0.0–0.7)
EOS PCT: 3 %
HCT: 33.3 % — ABNORMAL LOW (ref 36.0–46.0)
HEMOGLOBIN: 10.9 g/dL — AB (ref 12.0–15.0)
LYMPHS PCT: 27 %
Lymphs Abs: 2 10*3/uL (ref 0.7–4.0)
MCH: 31.5 pg (ref 26.0–34.0)
MCHC: 32.7 g/dL (ref 30.0–36.0)
MCV: 96.2 fL (ref 78.0–100.0)
Monocytes Absolute: 0.5 10*3/uL (ref 0.1–1.0)
Monocytes Relative: 6 %
NEUTROS PCT: 64 %
Neutro Abs: 4.7 10*3/uL (ref 1.7–7.7)
Platelets: 298 10*3/uL (ref 150–400)
RBC: 3.46 MIL/uL — AB (ref 3.87–5.11)
RDW: 15.3 % (ref 11.5–15.5)
WBC: 7.5 10*3/uL (ref 4.0–10.5)

## 2016-01-27 LAB — COMPREHENSIVE METABOLIC PANEL
ALBUMIN: 3.9 g/dL (ref 3.5–5.0)
ALT: 10 U/L — AB (ref 14–54)
AST: 18 U/L (ref 15–41)
Alkaline Phosphatase: 57 U/L (ref 38–126)
Anion gap: 6 (ref 5–15)
BILIRUBIN TOTAL: 0.3 mg/dL (ref 0.3–1.2)
BUN: 17 mg/dL (ref 6–20)
CO2: 29 mmol/L (ref 22–32)
CREATININE: 1.4 mg/dL — AB (ref 0.44–1.00)
Calcium: 10.1 mg/dL (ref 8.9–10.3)
Chloride: 101 mmol/L (ref 101–111)
GFR calc Af Amer: 47 mL/min — ABNORMAL LOW (ref >60)
GFR, EST NON AFRICAN AMERICAN: 40 mL/min — AB (ref >60)
GLUCOSE: 87 mg/dL (ref 65–99)
POTASSIUM: 4.4 mmol/L (ref 3.5–5.1)
Sodium: 136 mmol/L (ref 135–145)
TOTAL PROTEIN: 7.4 g/dL (ref 6.5–8.1)

## 2016-01-27 LAB — TSH: TSH: 3.098 u[IU]/mL (ref 0.350–4.500)

## 2016-01-27 MED ORDER — HEPARIN SOD (PORK) LOCK FLUSH 100 UNIT/ML IV SOLN
500.0000 [IU] | Freq: Once | INTRAVENOUS | Status: AC | PRN
  Administered 2016-01-27: 500 [IU]
  Filled 2016-01-27: qty 5

## 2016-01-27 MED ORDER — SODIUM CHLORIDE 0.9 % IV SOLN
Freq: Once | INTRAVENOUS | Status: AC
  Administered 2016-01-27: 11:00:00 via INTRAVENOUS

## 2016-01-27 MED ORDER — NIVOLUMAB CHEMO INJECTION 100 MG/10ML
240.0000 mg | Freq: Once | INTRAVENOUS | Status: AC
  Administered 2016-01-27: 240 mg via INTRAVENOUS
  Filled 2016-01-27: qty 4

## 2016-01-27 MED ORDER — SODIUM CHLORIDE 0.9 % IJ SOLN
10.0000 mL | INTRAMUSCULAR | Status: DC | PRN
  Administered 2016-01-27: 10 mL
  Filled 2016-01-27: qty 10

## 2016-01-27 NOTE — Patient Instructions (Signed)
[place] at [facility] Discharge Instructions  RECOMMENDATIONS MADE BY THE CONSULTANT AND ANY TEST RESULTS WILL BE SENT TO YOUR REFERRING PHYSICIAN.  Today you received Opdivo infusion. Return as scheduled in 2 weeks for treatment.  Call the clinic should you have any questions or concerns.   Thank you for choosing [place] at [facility] to provide your oncology and hematology care.  To afford each patient quality time with our provider, please arrive at least 15 minutes before your scheduled appointment time.   Beginning January 23rd 2017 lab work for the [facility] will be done in the  Main lab at [facility] on 1st floor. If you have a lab appointment with the [facility] please come in thru the  Main Entrance and check in at the main information desk  You need to re-schedule your appointment should you arrive 10 or more minutes late.  We strive to give you quality time with our providers, and arriving late affects you and other patients whose appointments are after yours.  Also, if you no show three or more times for appointments you may be dismissed from the clinic at the providers discretion.     Again, thank you for choosing [facility].  Our hope is that these requests will decrease the amount of time that you wait before being seen by our physicians.       _____________________________________________________________  Should you have questions after your visit to [facility], please contact our office at [phone] between the hours of 8:30 a.m. and 4:30 p.m.  Voicemails left after 4:30 p.m. will not be returned until the following business day.  For prescription refill requests, have your pharmacy contact our office.         Resources For Cancer Patients and their Caregivers ? American Cancer Society: Can assist with transportation, wigs, general needs, runs Look Good Feel Better.         2817665805 ? Cancer Care: Provides financial assistance, online support groups, medication/co-pay assistance.  1-800-813-HOPE 773 683 0252) ? [place] Assists [place] cancer patients and their families through emotional , educational and financial support.  587 797 0836 ? [place] DSS Where to apply for food stamps, Medicaid and utility assistance. 734-593-9391 ? RCATS: Transportation to medical appointments. 4582711628 ? Social Security Administration: May apply for disability if have a Stage IV cancer. 954-234-6181 (248)069-4134 ? [employer], Disability and Transit Services: Assists with nutrition, care and transit needs. [phone]

## 2016-01-27 NOTE — Progress Notes (Signed)
1440:  Tolerated infusion w/o adverse reaction.  A&Ox4, in no distress. VSS.  Discharged ambulatory

## 2016-02-03 ENCOUNTER — Ambulatory Visit (HOSPITAL_COMMUNITY)
Admission: RE | Admit: 2016-02-03 | Discharge: 2016-02-03 | Disposition: A | Discharge status: Home / Self Care | Payer: 59 | Source: Ambulatory Visit | Attending: Oncology | Admitting: Oncology

## 2016-02-03 DIAGNOSIS — C7971 Secondary malignant neoplasm of right adrenal gland: Secondary | ICD-10-CM | POA: Insufficient documentation

## 2016-02-03 DIAGNOSIS — C3411 Malignant neoplasm of upper lobe, right bronchus or lung: Secondary | ICD-10-CM

## 2016-02-03 MED ORDER — IOHEXOL 300 MG/ML  SOLN
100.0000 mL | Freq: Once | INTRAMUSCULAR | Status: AC | PRN
Start: 2016-02-03 — End: 2016-02-03
  Administered 2016-02-03: 100 mL via INTRAVENOUS

## 2016-02-10 ENCOUNTER — Encounter (HOSPITAL_COMMUNITY): Payer: Self-pay | Admitting: Hematology & Oncology

## 2016-02-10 ENCOUNTER — Encounter (HOSPITAL_BASED_OUTPATIENT_CLINIC_OR_DEPARTMENT_OTHER): Payer: 59

## 2016-02-10 ENCOUNTER — Encounter (HOSPITAL_BASED_OUTPATIENT_CLINIC_OR_DEPARTMENT_OTHER): Payer: 59 | Admitting: Hematology & Oncology

## 2016-02-10 VITALS — BP 110/64 | HR 78 | Temp 98.5°F | Resp 18 | Wt 89.7 lb

## 2016-02-10 VITALS — BP 95/61 | HR 75 | Temp 97.8°F | Resp 16

## 2016-02-10 DIAGNOSIS — E278 Other specified disorders of adrenal gland: Secondary | ICD-10-CM

## 2016-02-10 DIAGNOSIS — C3411 Malignant neoplasm of upper lobe, right bronchus or lung: Secondary | ICD-10-CM

## 2016-02-10 DIAGNOSIS — C7971 Secondary malignant neoplasm of right adrenal gland: Secondary | ICD-10-CM

## 2016-02-10 DIAGNOSIS — Z1239 Encounter for other screening for malignant neoplasm of breast: Secondary | ICD-10-CM

## 2016-02-10 DIAGNOSIS — D630 Anemia in neoplastic disease: Secondary | ICD-10-CM

## 2016-02-10 DIAGNOSIS — Z5112 Encounter for antineoplastic immunotherapy: Secondary | ICD-10-CM

## 2016-02-10 DIAGNOSIS — Z720 Tobacco use: Secondary | ICD-10-CM

## 2016-02-10 DIAGNOSIS — C3490 Malignant neoplasm of unspecified part of unspecified bronchus or lung: Secondary | ICD-10-CM

## 2016-02-10 DIAGNOSIS — C7951 Secondary malignant neoplasm of bone: Secondary | ICD-10-CM

## 2016-02-10 LAB — COMPREHENSIVE METABOLIC PANEL
ALK PHOS: 54 U/L (ref 38–126)
ALT: 10 U/L — AB (ref 14–54)
AST: 15 U/L (ref 15–41)
Albumin: 3.8 g/dL (ref 3.5–5.0)
Anion gap: 6 (ref 5–15)
BILIRUBIN TOTAL: 0.4 mg/dL (ref 0.3–1.2)
BUN: 16 mg/dL (ref 6–20)
CALCIUM: 9.3 mg/dL (ref 8.9–10.3)
CO2: 25 mmol/L (ref 22–32)
CREATININE: 1.32 mg/dL — AB (ref 0.44–1.00)
Chloride: 105 mmol/L (ref 101–111)
GFR, EST AFRICAN AMERICAN: 50 mL/min — AB (ref >60)
GFR, EST NON AFRICAN AMERICAN: 43 mL/min — AB (ref >60)
Glucose, Bld: 94 mg/dL (ref 65–99)
Potassium: 4.8 mmol/L (ref 3.5–5.1)
Sodium: 136 mmol/L (ref 135–145)
TOTAL PROTEIN: 7.1 g/dL (ref 6.5–8.1)

## 2016-02-10 LAB — CBC WITH DIFFERENTIAL/PLATELET
Basophils Absolute: 0 10*3/uL (ref 0.0–0.1)
Basophils Relative: 0 %
Eosinophils Absolute: 0.4 10*3/uL (ref 0.0–0.7)
Eosinophils Relative: 5 %
HCT: 31.9 % — ABNORMAL LOW (ref 36.0–46.0)
Hemoglobin: 10.5 g/dL — ABNORMAL LOW (ref 12.0–15.0)
LYMPHS ABS: 2 10*3/uL (ref 0.7–4.0)
LYMPHS PCT: 26 %
MCH: 31.9 pg (ref 26.0–34.0)
MCHC: 32.9 g/dL (ref 30.0–36.0)
MCV: 97 fL (ref 78.0–100.0)
MONO ABS: 0.5 10*3/uL (ref 0.1–1.0)
MONOS PCT: 7 %
NEUTROS ABS: 4.7 10*3/uL (ref 1.7–7.7)
Neutrophils Relative %: 62 %
Platelets: 260 10*3/uL (ref 150–400)
RBC: 3.29 MIL/uL — ABNORMAL LOW (ref 3.87–5.11)
RDW: 15 % (ref 11.5–15.5)
WBC: 7.6 10*3/uL (ref 4.0–10.5)

## 2016-02-10 MED ORDER — SODIUM CHLORIDE 0.9 % IJ SOLN
10.0000 mL | INTRAMUSCULAR | Status: DC | PRN
  Administered 2016-02-10: 10 mL
  Filled 2016-02-10: qty 10

## 2016-02-10 MED ORDER — SODIUM CHLORIDE 0.9 % IV SOLN
240.0000 mg | Freq: Once | INTRAVENOUS | Status: AC
  Administered 2016-02-10: 240 mg via INTRAVENOUS
  Filled 2016-02-10: qty 4

## 2016-02-10 MED ORDER — HEPARIN SOD (PORK) LOCK FLUSH 100 UNIT/ML IV SOLN
INTRAVENOUS | Status: AC
  Filled 2016-02-10: qty 5

## 2016-02-10 MED ORDER — HEPARIN SOD (PORK) LOCK FLUSH 100 UNIT/ML IV SOLN
500.0000 [IU] | Freq: Once | INTRAVENOUS | Status: AC | PRN
  Administered 2016-02-10: 500 [IU]

## 2016-02-10 MED ORDER — SODIUM CHLORIDE 0.9 % IV SOLN
Freq: Once | INTRAVENOUS | Status: AC
  Administered 2016-02-10: 13:00:00 via INTRAVENOUS

## 2016-02-10 NOTE — Patient Instructions (Addendum)
[place] at [facility] Discharge Instructions  RECOMMENDATIONS MADE BY THE CONSULTANT AND ANY TEST RESULTS WILL BE SENT TO YOUR REFERRING PHYSICIAN.   Exam and discussion by Dr Whitney Muse today opdivo today if labs are good Mammogram scheduled  When your mouth is good and healed we can start you back on the medication for your bones  opdivo in 2 weeks  Return to see the doctor in 1 month  Please call the clinic if you have any questions or concerns    Thank you for choosing [place] at [facility] to provide your oncology and hematology care.  To afford each patient quality time with our provider, please arrive at least 15 minutes before your scheduled appointment time.   Beginning January 23rd 2017 lab work for the [facility] will be done in the  Main lab at [facility] on 1st floor. If you have a lab appointment with the [facility] please come in thru the  Main Entrance and check in at the main information desk  You need to re-schedule your appointment should you arrive 10 or more minutes late.  We strive to give you quality time with our providers, and arriving late affects you and other patients whose appointments are after yours.  Also, if you no show three or more times for appointments you may be dismissed from the clinic at the providers discretion.     Again, thank you for choosing [facility].  Our hope is that these requests will decrease the amount of time that you wait before being seen by our physicians.       _____________________________________________________________  Should you have questions after your visit to [facility], please contact our office at [phone] between the hours of 8:30 a.m. and 4:30 p.m.  Voicemails left after 4:30 p.m. will not be returned until the following business day.  For prescription refill requests, have your pharmacy contact our office.          Resources For Cancer Patients and their Caregivers ? American Cancer Society: Can assist with transportation, wigs, general needs, runs Look Good Feel Better.        808-751-2254 ? Cancer Care: Provides financial assistance, online support groups, medication/co-pay assistance.  1-800-813-HOPE 973 087 6384) ? [place] Assists [place] cancer patients and their families through emotional , educational and financial support.  3108342742 ? [place] DSS Where to apply for food stamps, Medicaid and utility assistance. 339-307-7884 ? RCATS: Transportation to medical appointments. (216)407-3820 ? Social Security Administration: May apply for disability if have a Stage IV cancer. 236-524-8260 906-284-1662 ? [employer], Disability and Transit Services: Assists with nutrition, care and transit needs. 970-767-5046

## 2016-02-10 NOTE — Progress Notes (Signed)
Tolerated chemo well. Ambulatory on discharge home with family.

## 2016-02-10 NOTE — Progress Notes (Signed)
Melissa Sullivan, stage IV, bilateral adrenal metastases, retroperitoneal lymph nodes  Biopsy on 08/01/2014 c/w poorly differentiated adenocarcinoma  XRT completed on 10/27/2014  Right Brachial plexus neuralgia  EGFR mutation negative ALK mutation negative    Cancer of upper lobe of right lung ([place])   07/28/2014 Imaging CT chest: Large Melissa apical mass consistent with malignancy. This is destroying the Melissa 2nd rib with extension into adjacent soft tissue. Melissa hilar adenopathy with Melissa 5cm adrenal metastatic lesion.   08/01/2014 Initial Biopsy Lung, needle/core biopsy(ies), right upper lobe - POORLY DIFFERENTIATED ADENOCARCINOMA, SEE COMMENT.   08/08/2014 PET scan Large hypermetabolic Melissa apical mass with evidence of direct chest wall and mediastinal invasion, right retrocrural lymphadenopathy, extensive retroperitoneal lymphadenopathy, and metastatic lesions to the adrenal glands    09/02/2014 - 11/04/2014 Chemotherapy Cisplatin/Pemetrexed/Avastin every 21 days x 4 cycles   10/07/2014 - 10/27/2014 Radiation Therapy Right lung apex for control of brachioplexopathy.   12/24/2014 - 02/25/2015 Chemotherapy Alimta/Avastin every 21 days.   02/20/2015 Imaging Increase in size of right adrenal metastasis and subjacent confluent retrocaval lymphadenopathy   02/25/2015 -  Chemotherapy Nivolumab, zometa   05/04/2015 Imaging CT CAP- Stable to slight decrease in the posterior right apical lesion. Stable appearance of posterior right upper rib an upper thoracic bony lesions. Slight improvement in right upper lobe tree-in-bud opacity. No new or progressive findings in...   07/28/2015 Imaging CT CAP- Reduced size of the right apical pleural parenchymal lesion and reduced size of the right adrenal metastatic lesion. Resolution of prior retrocrural adenopathy.  Right eccentric T1 and T2 sclerosis with sclerosis and tapering of the right second..   11/17/2015 Imaging CT CAP- Stable soft tissue thickening in the apex of the right hemi  thorax. Stable right adrenal metastasis. Nodularity along the trachea and mainstem bronchi, relatively new from 07/28/2015, favoring adherent debris.   11/24/2015 Treatment Plan Change Zometa on hold at this time in preparation for tooth extraction in March 2017.  Zometa las given on 11/18/2015.   02/03/2016 Imaging CT CAP- Heterogeneous right apical masslike consolidation and right adrenal metastasis are unchanged     CURRENT THERAPY: Nivolumab/zometa/Aranesp  INTERVAL HISTORY: Melissa Sullivan 60 y.o. female returns for follow-up of stage IV adenocarcinoma of the lung. She is doing well.  She sleeps ok. She denies any pain. Her mood is good. She continues on single agent nivolumab.   Melissa Sullivan was here with her daughter today. She is receiving cycle #11 of Nivolumab today.  She had 3 teeth pulled yesterday and is going to have 2 more pulled on Thursday. Her zometa continues to be held.   She says she has been fine, has been eating and that her appetite is okay. She can only eat soft food. She ate chicken noodle soup for breakfast this morning. She has no new pain and has been sleeping okay.   Bowels are okay. Has not been coughing up anything "no blood, no nothing"  Her last mammogram was 2 years ago. She is interested in proceeding with another one.   She does not need any refills today.  She has no other complaints or concerns.   MEDICAL HISTORY: Past Medical History  Diagnosis Date  . Hypertension   . Reflux   . Hyperlipidemia   . Lung mass 07/28/2014  . Adrenal mass, right ([place]) 07/28/2014  . Diabetes mellitus without complication ([place])   . [facility])     lung  right    has Adrenal mass, right (  [place]); Cancer of upper lobe of right lung ([place]); ARF (acute renal failure) ([place]); Abnormal finding on imaging; Oropharyngeal dysphagia; Nausea and vomiting; Hypercalcemia; Anemia in neoplastic disease; Dysphagia; and Encounter for screening colonoscopy on her problem list.        Cancer of upper lobe of right lung ([place])   07/28/2014 Imaging CT chest: Large Melissa apical mass consistent with malignancy. This is destroying the Melissa 2nd rib with extension into adjacent soft tissue. Melissa hilar adenopathy with Melissa 5cm adrenal metastatic lesion.   08/01/2014 Initial Biopsy Lung, needle/core biopsy(ies), right upper lobe - POORLY DIFFERENTIATED ADENOCARCINOMA, SEE COMMENT.   08/08/2014 PET scan Large hypermetabolic Melissa apical mass with evidence of direct chest wall and mediastinal invasion, right retrocrural lymphadenopathy, extensive retroperitoneal lymphadenopathy, and metastatic lesions to the adrenal glands    09/02/2014 - 11/04/2014 Chemotherapy Cisplatin/Pemetrexed/Avastin every 21 days x 4 cycles   10/07/2014 - 10/27/2014 Radiation Therapy Right lung apex for control of brachioplexopathy.   12/24/2014 - 02/25/2015 Chemotherapy Alimta/Avastin every 21 days.   02/20/2015 Imaging Increase in size of right adrenal metastasis and subjacent confluent retrocaval lymphadenopathy   02/25/2015 -  Chemotherapy Nivolumab, zometa   05/04/2015 Imaging CT CAP- Stable to slight decrease in the posterior right apical lesion. Stable appearance of posterior right upper rib an upper thoracic bony lesions. Slight improvement in right upper lobe tree-in-bud opacity. No new or progressive findings in...   07/28/2015 Imaging CT CAP- Reduced size of the right apical pleural parenchymal lesion and reduced size of the right adrenal metastatic lesion. Resolution of prior retrocrural adenopathy.  Right eccentric T1 and T2 sclerosis with sclerosis and tapering of the right second..   11/17/2015 Imaging CT CAP- Stable soft tissue thickening in the apex of the right hemi thorax. Stable right adrenal metastasis. Nodularity along the trachea and mainstem bronchi, relatively new from 07/28/2015, favoring adherent debris.   11/24/2015 Treatment Plan Change Zometa on hold at this time in preparation for tooth extraction in March 2017.  Zometa las  given on 11/18/2015.     has No Known Allergies.  Melissa Sullivan does not currently have medications on file.  SURGICAL HISTORY: Past Surgical History  Procedure Laterality Date  . Appendectomy    . Lung biopsy Right 07/2014    CT guided  . Portacath placement Left 09/01/2014  . Esophagogastroduodenoscopy N/A 12/09/2014    UYQ:IHKVQQ esophageal stricture/mild-to-noderate erosive gastritis. negative H.pylori  . Savory dilation N/A 12/09/2014    Procedure: SAVORY DILATION;  Surgeon: [name], MD;  [facility];  Service: Endoscopy;  Laterality: N/A;  [name] dilation N/A 12/09/2014    Procedure: [name] DILATION;  Surgeon: [name], MD;  Location: [facility];  Service: Endoscopy;  Laterality: N/A;  . Flexible sigmoidoscopy  2011    Dr. [name]: hyperplastic polyp    SOCIAL HISTORY: Social History   Social History  . Marital Status: Married    Spouse Name: N/A  . Number of Children: N/A  . Years of Education: N/A   Occupational History  . Not on file.   Social History Main Topics  . Smoking status: Light Tobacco Smoker -- 0.30 packs/day    Types: Cigarettes  . Smokeless tobacco: Never Used  . Alcohol Use: No  . Drug Use: No  . Sexual Activity: Not on file   Other Topics Concern  . Not on file   Social History Narrative    FAMILY HISTORY: Family History  Problem Relation Age of Onset  .  Cancer Sister     Review of Systems  Constitutional: Negative for fatigue. Negative for fever and chills.  HENT: Negative for congestion, hearing loss, nosebleeds, sore throat and tinnitus.   Eyes: Negative for blurred vision, double vision, pain and discharge.  Respiratory: Negative for cough, hemoptysis, sputum production, shortness of breath and wheezing.   Cardiovascular: Negative for chest pain, palpitations, claudication, leg swelling and PND.  Gastrointestinal: . Negative for heartburn, nausea, vomiting, diarrhea, constipation, blood in stool and  melena.  Genitourinary: Negative for dysuria, urgency, frequency and hematuria.  Musculoskeletal: Negative for myalgias, joint pain and falls.  Skin: Negative for itching and rash.  Neurological: Negative for dizziness, tingling, tremors, sensory change, speech change, focal weakness, seizures, loss of consciousness, weakness and headaches.  Endo/Heme/Allergies: Does not bruise/bleed easily.  Psychiatric/Behavioral: Negative for depression, suicidal ideas, memory loss and substance abuse. The patient is not nervous/anxious and does not have insomnia.    14 point review of systems was performed and is negative except as detailed under history of present illness and above   PHYSICAL EXAMINATION  ECOG PERFORMANCE STATUS: 1 - Symptomatic but completely ambulatory  Filed Vitals:   02/10/16 1100  BP: 110/64  Pulse: 78  Temp: 98.5 F (36.9 C)  Resp: 18    Physical Exam  Constitutional: She is oriented to person, place, and time.  HENT:  Head: Normocephalic and atraumatic.  Nose: Nose normal.  Mouth/Throat: Oropharynx is clear and moist. No oropharyngeal exudate.  Eyes: Conjunctivae and EOM are normal. Pupils are equal, round, and reactive to light. Right eye exhibits no discharge. Left eye exhibits no discharge. No scleral icterus.  Neck: Normal range of motion. Neck supple. No tracheal deviation present. No thyromegaly present.  Cardiovascular: Normal rate, regular rhythm and normal heart sounds.  Exam reveals no gallop and no friction rub.   No murmur heard. Pulmonary/Chest: Effort normal and breath sounds normal. She has no wheezes. She has no rales.  Abdominal: Soft. Bowel sounds are normal. She exhibits no distension and no mass. There is no tenderness. There is no rebound and no guarding.  Thin  Musculoskeletal: Normal range of motion. She exhibits no edema.  Lymphadenopathy:    She has no cervical adenopathy.  Neurological: She is alert and oriented to person, place, and time.  She has normal reflexes. No cranial nerve deficit. Gait normal. Coordination normal.  Skin: Skin is warm and dry. No rash noted.  Psychiatric: Mood, memory, affect and judgment normal.  Nursing note and vitals reviewed.   LABORATORY DATA: I have reviewed the data below as listed. CBC    Component Value Date/Time   WBC 7.6 02/10/2016 1142   RBC 3.29* 02/10/2016 1142   RBC 2.45* 01/15/2015 0901   HGB 10.5* 02/10/2016 1142   HCT 31.9* 02/10/2016 1142   PLT 260 02/10/2016 1142   MCV 97.0 02/10/2016 1142   MCH 31.9 02/10/2016 1142   MCHC 32.9 02/10/2016 1142   RDW 15.0 02/10/2016 1142   LYMPHSABS 2.0 02/10/2016 1142   MONOABS 0.5 02/10/2016 1142   EOSABS 0.4 02/10/2016 1142   BASOSABS 0.0 02/10/2016 1142   CMP     Component Value Date/Time   NA 136 02/10/2016 1142   K 4.8 02/10/2016 1142   CL 105 02/10/2016 1142   CO2 25 02/10/2016 1142   GLUCOSE 94 02/10/2016 1142   BUN 16 02/10/2016 1142   CREATININE 1.32* 02/10/2016 1142   CALCIUM 9.3 02/10/2016 1142   CALCIUM 9.7 06/03/2015 1000  PROT 7.1 02/10/2016 1142   ALBUMIN 3.8 02/10/2016 1142   AST 15 02/10/2016 1142   ALT 10* 02/10/2016 1142   ALKPHOS 54 02/10/2016 1142   BILITOT 0.4 02/10/2016 1142   GFRNONAA 43* 02/10/2016 1142   GFRAA 50* 02/10/2016 1142     RADIOLOGY: I have personally reviewed the radiological images as listed and agreed with the findings in the report. Study Result     CLINICAL DATA: Stage IV lung cancer, right upper lobe. Adrenal metastases. Chemotherapy and radiation therapy.  EXAM: CT CHEST, ABDOMEN, AND PELVIS WITH CONTRAST  TECHNIQUE: Multidetector CT imaging of the chest, abdomen and pelvis was performed following the standard protocol during bolus administration of intravenous contrast.  CONTRAST: 14m OMNIPAQUE IOHEXOL 300 MG/ML SOLN  COMPARISON: 11/17/2015.  FINDINGS: CT CHEST FINDINGS  Mediastinum/Lymph Nodes: No pathologically enlarged mediastinal, hilar or  axillary lymph nodes. Left IJ Port-A-Cath terminates in the low SVC. Atherosclerotic calcification of the arterial vasculature, including coronary arteries. Heart size normal. No pericardial effusion.  Lungs/Pleura: Heterogeneous masslike consolidation in the apical segment right upper lobe is unchanged. Moderate centrilobular emphysema. Peribronchovascular nodularity in the posterior segment right upper lobe (6/16), new and likely infectious or post infectious in etiology. Scattered mucoid impaction. No pleural fluid. Previously seen nodularity along the trachea and mainstem bronchi has largely resolved. Likely dependent debris within the distal left mainstem bronchus.  Musculoskeletal: No worrisome lytic or sclerotic lesions.  CT ABDOMEN PELVIS FINDINGS  Hepatobiliary: Liver and gallbladder are unremarkable. No biliary ductal dilatation.  Pancreas: Negative.  Spleen: Negative.  Adrenals/Urinary Tract: Right adrenal mass measures 2.4 x 4.0 cm, stable. Left adrenal glands and kidneys are unremarkable. Ureters are decompressed. Bladder is low in volume.  Stomach/Bowel: Stomach, small bowel and colon are unremarkable. Appendectomy.  Vascular/Lymphatic: Atherosclerotic calcification of the arterial vasculature without abdominal aortic aneurysm. No pathologically enlarged lymph nodes.  Reproductive: Uterus and ovaries are visualized.  Other: No free fluid. Mesenteries and peritoneum are unremarkable.  Musculoskeletal: No worrisome lytic or sclerotic lesions.  IMPRESSION: 1. Heterogeneous right apical masslike consolidation and right adrenal metastasis are unchanged. 2. Previously seen nodularity along the trachea and mainstem bronchi has largely resolved. Dependent debris is seen in the distal left mainstem bronchus.   Electronically Signed  By: MLorin PicketM.D.  On: 02/03/2016 14:54     ASSESSMENT and THERAPY PLAN:   Stage IV adenocarcinoma  of the lung, EGFR and ALK negative  She is certainly doing well clinically. CT scans show stable disease. These were reviewed with the patient. Scans will be repeated in 3 months. We will continue with current therapy. She has absolutely no major complaints today. Again reviewed the potential side effects of her current treatment. She will proceed with Nivolumab today.   Hypercalcemia/bony metastatic disease  Zometa will be resumed once dental extractions are complete and she is healed.   Tobacco ABUSE  I re-emphasized the benefits of smoking cessation. I have offered her methods to quit in the past. She has cut back significantly.  She did not need any refills today.  She will return in a month for a follow up.   All questions were answered. The patient knows to call the clinic with any problems, questions or concerns. We can certainly see the patient much sooner if necessary.   This document serves as a record of services personally performed by SAncil Linsey MD. It was created on her behalf by MKandace Blitz a trained medical scribe. The creation of this record is  based on the scribe's personal observations and the provider's statements to them. This document has been checked and approved by the attending provider.  I have reviewed the above documentation for accuracy and completeness, and I agree with the above.  This note was signed electronically  Larene Beach K. Whitney Muse, MD

## 2016-02-10 NOTE — Addendum Note (Signed)
Addended by: Kurtis Bushman A on: 02/10/2016 03:47 PM   Modules accepted: Orders

## 2016-02-10 NOTE — Patient Instructions (Signed)
[facility] Discharge Instructions for Patients Receiving Chemotherapy   Beginning January 23rd 2017 lab work for the [facility] will be done in the  Main lab at [facility] on 1st floor. If you have a lab appointment with the [facility] please come in thru the  Main Entrance and check in at the main information desk   Today you received the following chemotherapy agents Opdiva.  To help prevent nausea and vomiting after your treatment, we encourage you to take your nausea medication as instructed. If you develop nausea and vomiting, or diarrhea that is not controlled by your medication, call the clinic.  The clinic phone number is [phone]. Office hours are Monday-Friday 8:30am-5:00pm.  BELOW ARE SYMPTOMS THAT SHOULD BE REPORTED IMMEDIATELY:  *FEVER GREATER THAN 101.0 F  *CHILLS WITH OR WITHOUT FEVER  NAUSEA AND VOMITING THAT IS NOT CONTROLLED WITH YOUR NAUSEA MEDICATION  *UNUSUAL SHORTNESS OF BREATH  *UNUSUAL BRUISING OR BLEEDING  TENDERNESS IN MOUTH AND THROAT WITH OR WITHOUT PRESENCE OF ULCERS  *URINARY PROBLEMS  *BOWEL PROBLEMS  UNUSUAL RASH Items with * indicate a potential emergency and should be followed up as soon as possible. If you have an emergency after office hours please contact your primary care physician or go to the nearest emergency department.  Please call the clinic during office hours if you have any questions or concerns.   You may also contact the Patient Navigator at [phone] should you have any questions or need assistance in obtaining follow up care.  Resources For Cancer Patients and their Caregivers ? American Cancer Society: Can assist with transportation, wigs, general needs, runs Look Good Feel Better.        3670404141 ? Cancer Care: Provides financial assistance, online support groups, medication/co-pay assistance.  1-800-813-HOPE 202-465-3061) ? [facility] Assists [place]  cancer patients and their families through emotional , educational and financial support.  [phone] ? [place] DSS Where to apply for food stamps, Medicaid and utility assistance. 365-759-0204 ? [facility]: Transportation to medical appointments. 6164044556 ? Social Security Administration: May apply for disability if have a Stage IV cancer. [phone] ? [employer], Disability and Transit Services: Assists with nutrition, care and transit needs. ([phone] [phone]

## 2016-02-17 ENCOUNTER — Ambulatory Visit (HOSPITAL_COMMUNITY)
Admission: RE | Admit: 2016-02-17 | Discharge: 2016-02-17 | Disposition: A | Discharge status: Home / Self Care | Payer: 59 | Source: Ambulatory Visit | Attending: Hematology & Oncology | Admitting: Hematology & Oncology

## 2016-02-17 ENCOUNTER — Other Ambulatory Visit (HOSPITAL_COMMUNITY): Payer: Self-pay | Admitting: Hematology & Oncology

## 2016-02-17 ENCOUNTER — Ambulatory Visit (HOSPITAL_COMMUNITY): Payer: 59

## 2016-02-17 ENCOUNTER — Encounter (HOSPITAL_COMMUNITY): Payer: 59

## 2016-02-17 DIAGNOSIS — Z1231 Encounter for screening mammogram for malignant neoplasm of breast: Secondary | ICD-10-CM

## 2016-02-17 DIAGNOSIS — D630 Anemia in neoplastic disease: Secondary | ICD-10-CM

## 2016-02-17 DIAGNOSIS — C3411 Malignant neoplasm of upper lobe, right bronchus or lung: Secondary | ICD-10-CM | POA: Diagnosis not present

## 2016-02-17 LAB — FERRITIN: Ferritin: 115 ng/mL (ref 11–307)

## 2016-02-17 LAB — RETICULOCYTES
RBC.: 3.6 MIL/uL — AB (ref 3.87–5.11)
RETIC CT PCT: 2 % (ref 0.4–3.1)
Retic Count, Absolute: 72 10*3/uL (ref 19.0–186.0)

## 2016-02-17 LAB — IRON AND TIBC
Iron: 70 ug/dL (ref 28–170)
SATURATION RATIOS: 26 % (ref 10.4–31.8)
TIBC: 274 ug/dL (ref 250–450)
UIBC: 204 ug/dL

## 2016-02-17 LAB — VITAMIN B12: VITAMIN B 12: 1074 pg/mL — AB (ref 180–914)

## 2016-02-17 LAB — FOLATE: Folate: 28.4 ng/mL (ref >5.9)

## 2016-02-18 LAB — HAPTOGLOBIN: HAPTOGLOBIN: 271 mg/dL — AB (ref 34–200)

## 2016-02-18 LAB — ERYTHROPOIETIN: ERYTHROPOIETIN: 21.8 m[IU]/mL — AB (ref 2.6–18.5)

## 2016-02-24 ENCOUNTER — Encounter (HOSPITAL_COMMUNITY): Payer: Self-pay

## 2016-02-24 ENCOUNTER — Encounter (HOSPITAL_COMMUNITY): Payer: 59 | Attending: Hematology and Oncology

## 2016-02-24 VITALS — BP 91/64 | HR 80 | Temp 98.5°F | Resp 18 | Wt 89.4 lb

## 2016-02-24 DIAGNOSIS — Z5112 Encounter for antineoplastic immunotherapy: Secondary | ICD-10-CM

## 2016-02-24 DIAGNOSIS — Z9089 Acquired absence of other organs: Secondary | ICD-10-CM | POA: Insufficient documentation

## 2016-02-24 DIAGNOSIS — K219 Gastro-esophageal reflux disease without esophagitis: Secondary | ICD-10-CM | POA: Diagnosis not present

## 2016-02-24 DIAGNOSIS — E785 Hyperlipidemia, unspecified: Secondary | ICD-10-CM | POA: Insufficient documentation

## 2016-02-24 DIAGNOSIS — F17210 Nicotine dependence, cigarettes, uncomplicated: Secondary | ICD-10-CM | POA: Diagnosis not present

## 2016-02-24 DIAGNOSIS — C7971 Secondary malignant neoplasm of right adrenal gland: Secondary | ICD-10-CM | POA: Insufficient documentation

## 2016-02-24 DIAGNOSIS — J449 Chronic obstructive pulmonary disease, unspecified: Secondary | ICD-10-CM | POA: Insufficient documentation

## 2016-02-24 DIAGNOSIS — C772 Secondary and unspecified malignant neoplasm of intra-abdominal lymph nodes: Secondary | ICD-10-CM | POA: Insufficient documentation

## 2016-02-24 DIAGNOSIS — C7972 Secondary malignant neoplasm of left adrenal gland: Secondary | ICD-10-CM | POA: Diagnosis not present

## 2016-02-24 DIAGNOSIS — C7989 Secondary malignant neoplasm of other specified sites: Secondary | ICD-10-CM | POA: Insufficient documentation

## 2016-02-24 DIAGNOSIS — C3411 Malignant neoplasm of upper lobe, right bronchus or lung: Secondary | ICD-10-CM | POA: Insufficient documentation

## 2016-02-24 DIAGNOSIS — C781 Secondary malignant neoplasm of mediastinum: Secondary | ICD-10-CM | POA: Diagnosis not present

## 2016-02-24 DIAGNOSIS — Z79899 Other long term (current) drug therapy: Secondary | ICD-10-CM | POA: Insufficient documentation

## 2016-02-24 DIAGNOSIS — I10 Essential (primary) hypertension: Secondary | ICD-10-CM | POA: Diagnosis not present

## 2016-02-24 DIAGNOSIS — E278 Other specified disorders of adrenal gland: Secondary | ICD-10-CM

## 2016-02-24 LAB — CBC WITH DIFFERENTIAL/PLATELET
BASOS ABS: 0 10*3/uL (ref 0.0–0.1)
Basophils Relative: 0 %
EOS PCT: 3 %
Eosinophils Absolute: 0.2 10*3/uL (ref 0.0–0.7)
HEMATOCRIT: 32.1 % — AB (ref 36.0–46.0)
Hemoglobin: 10.7 g/dL — ABNORMAL LOW (ref 12.0–15.0)
LYMPHS PCT: 24 %
Lymphs Abs: 1.7 10*3/uL (ref 0.7–4.0)
MCH: 31.9 pg (ref 26.0–34.0)
MCHC: 33.3 g/dL (ref 30.0–36.0)
MCV: 95.8 fL (ref 78.0–100.0)
Monocytes Absolute: 0.6 10*3/uL (ref 0.1–1.0)
Monocytes Relative: 8 %
NEUTROS ABS: 4.6 10*3/uL (ref 1.7–7.7)
Neutrophils Relative %: 65 %
PLATELETS: 266 10*3/uL (ref 150–400)
RBC: 3.35 MIL/uL — ABNORMAL LOW (ref 3.87–5.11)
RDW: 14.5 % (ref 11.5–15.5)
WBC: 7.2 10*3/uL (ref 4.0–10.5)

## 2016-02-24 LAB — COMPREHENSIVE METABOLIC PANEL
ALT: 10 U/L — ABNORMAL LOW (ref 14–54)
ANION GAP: 6 (ref 5–15)
AST: 19 U/L (ref 15–41)
Albumin: 3.9 g/dL (ref 3.5–5.0)
Alkaline Phosphatase: 53 U/L (ref 38–126)
BILIRUBIN TOTAL: 0.4 mg/dL (ref 0.3–1.2)
BUN: 19 mg/dL (ref 6–20)
CHLORIDE: 105 mmol/L (ref 101–111)
CO2: 25 mmol/L (ref 22–32)
Calcium: 9.7 mg/dL (ref 8.9–10.3)
Creatinine, Ser: 1.23 mg/dL — ABNORMAL HIGH (ref 0.44–1.00)
GFR, EST AFRICAN AMERICAN: 55 mL/min — AB (ref >60)
GFR, EST NON AFRICAN AMERICAN: 47 mL/min — AB (ref >60)
Glucose, Bld: 80 mg/dL (ref 65–99)
POTASSIUM: 4.5 mmol/L (ref 3.5–5.1)
Sodium: 136 mmol/L (ref 135–145)
TOTAL PROTEIN: 7.3 g/dL (ref 6.5–8.1)

## 2016-02-24 LAB — TSH: TSH: 0.605 u[IU]/mL (ref 0.350–4.500)

## 2016-02-24 MED ORDER — SODIUM CHLORIDE 0.9 % IJ SOLN
10.0000 mL | INTRAMUSCULAR | Status: DC | PRN
  Administered 2016-02-24: 10 mL
  Filled 2016-02-24: qty 10

## 2016-02-24 MED ORDER — SODIUM CHLORIDE 0.9 % IV SOLN
240.0000 mg | Freq: Once | INTRAVENOUS | Status: AC
  Administered 2016-02-24: 240 mg via INTRAVENOUS
  Filled 2016-02-24: qty 8

## 2016-02-24 MED ORDER — SODIUM CHLORIDE 0.9 % IV SOLN
Freq: Once | INTRAVENOUS | Status: AC
  Administered 2016-02-24: 15:00:00 via INTRAVENOUS

## 2016-02-24 MED ORDER — HEPARIN SOD (PORK) LOCK FLUSH 100 UNIT/ML IV SOLN
500.0000 [IU] | Freq: Once | INTRAVENOUS | Status: AC | PRN
  Administered 2016-02-24: 500 [IU]
  Filled 2016-02-24: qty 5

## 2016-02-24 NOTE — Progress Notes (Signed)
Tolerated well. No c/o voiced

## 2016-02-24 NOTE — Patient Instructions (Signed)
[facility] Discharge Instructions for Patients Receiving Chemotherapy   Beginning January 23rd 2017 lab work for the Cancer Center will be done in the  Main lab at [facility] on 1st floor. If you have a lab appointment with the Cancer Center please come in thru the  Main Entrance and check in at the main information desk   Today you received the following chemotherapy agents Opdivo  To help prevent nausea and vomiting after your treatment, we encourage you to take your nausea medication   If you develop nausea and vomiting, or diarrhea that is not controlled by your medication, call the clinic.  The clinic phone number is [phone]. Office hours are Monday-Friday 8:30am-5:00pm.  BELOW ARE SYMPTOMS THAT SHOULD BE REPORTED IMMEDIATELY:  *FEVER GREATER THAN 101.0 F  *CHILLS WITH OR WITHOUT FEVER  NAUSEA AND VOMITING THAT IS NOT CONTROLLED WITH YOUR NAUSEA MEDICATION  *UNUSUAL SHORTNESS OF BREATH  *UNUSUAL BRUISING OR BLEEDING  TENDERNESS IN MOUTH AND THROAT WITH OR WITHOUT PRESENCE OF ULCERS  *URINARY PROBLEMS  *BOWEL PROBLEMS  UNUSUAL RASH Items with * indicate a potential emergency and should be followed up as soon as possible. If you have an emergency after office hours please contact your primary care physician or go to the nearest emergency department.  Please call the clinic during office hours if you have any questions or concerns.   You may also contact the Patient Navigator at [phone] should you have any questions or need assistance in obtaining follow up care.      Resources For Cancer Patients and their Caregivers ? American Cancer Society: Can assist with transportation, wigs, general needs, runs Look Good Feel Better.        1-888-227-6333 ? Cancer Care: Provides financial assistance, online support groups, medication/co-pay assistance.  1-800-813-HOPE (4673) ? [facility] Assists [place] cancer  patients and their families through emotional , educational and financial support.  336-427-4357 ? [place] DSS Where to apply for food stamps, Medicaid and utility assistance. 336-342-1394 ? [facility]: Transportation to medical appointments. 336-347-2287 ? Social Security Administration: May apply for disability if have a Stage IV cancer. 336-342-7796 1-800-772-1213 ? [place] [facility], Disability and Transit Services: Assists with nutrition, care and transit needs. 336-349-2343          

## 2016-03-08 NOTE — Progress Notes (Signed)
Melissa Neighbors, MD [place] [address]  Cancer of upper lobe of right lung [facility]) - Plan: CBC with Differential, Comprehensive metabolic panel, TSH, nivolumab (OPDIVO) 240 mg in sodium chloride 0.9 % 100 mL chemo infusion  Adrenal mass, right (HCC) - Plan: nivolumab (OPDIVO) 240 mg in sodium chloride 0.9 % 100 mL chemo infusion  CURRENT THERAPY: Nivolumab every 2 weeks.  Zometa on hold in the setting of tooth extraction 2 weeks ago.  INTERVAL HISTORY: KARRON GOENS 60 y.o. 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     Cancer of upper lobe of right lung ([place])   07/28/2014 Imaging CT chest: Large R apical mass consistent with malignancy. This is destroying the R 2nd rib with extension into adjacent soft tissue. R hilar adenopathy with R 5cm adrenal metastatic lesion.   08/01/2014 Initial Biopsy Lung, needle/core biopsy(ies), right upper lobe - POORLY DIFFERENTIATED ADENOCARCINOMA, SEE COMMENT.   08/08/2014 PET scan Large hypermetabolic R apical mass with evidence of direct chest wall and mediastinal invasion, right retrocrural lymphadenopathy, extensive retroperitoneal lymphadenopathy, and metastatic lesions to the adrenal glands    09/02/2014 - 11/04/2014 Chemotherapy Cisplatin/Pemetrexed/Avastin every 21 days x 4 cycles   10/07/2014 - 10/27/2014 Radiation Therapy Right lung apex for control of brachioplexopathy.   12/24/2014 - 02/25/2015 Chemotherapy Alimta/Avastin every 21 days.   02/20/2015 Imaging Increase in size of right adrenal metastasis and subjacent confluent retrocaval lymphadenopathy   02/25/2015 -  Chemotherapy Nivolumab, zometa   05/04/2015 Imaging CT CAP- Stable to slight decrease in the posterior right apical lesion. Stable appearance of posterior right upper rib an upper thoracic bony lesions. Slight improvement in right upper lobe tree-in-bud  opacity. No new or progressive findings in...   07/28/2015 Imaging CT CAP- Reduced size of the right apical pleural parenchymal lesion and reduced size of the right adrenal metastatic lesion. Resolution of prior retrocrural adenopathy.  Right eccentric T1 and T2 sclerosis with sclerosis and tapering of the right second..   11/17/2015 Imaging CT CAP- Stable soft tissue thickening in the apex of the right hemi thorax. Stable right adrenal metastasis. Nodularity along the trachea and mainstem bronchi, relatively new from 07/28/2015, favoring adherent debris.   11/24/2015 Treatment Plan Change Zometa on hold at this time in preparation for tooth extraction in March 2017.  Zometa las given on 11/18/2015.   02/03/2016 Imaging CT CAP- Heterogeneous right apical masslike consolidation and right adrenal metastasis are unchanged    I personally reviewed and went over laboratory results with the patient.  The results are noted within this dictation.  They are being updated today prior to immunotherapy administration.   I personally reviewed and went over radiographic studies with the patient.  The results are noted within this dictation.  Mammogram on 02/26/2016 was BIRADS 1.  She denies any hemoptysis, shortness of breath, dyspnea, new pains, or any other complaints.  She had teeth extracted approximately 2 weeks ago. Therefore, symmetrical we'll continue to be on hold.  She notes a bout of constipation on Saturday with small amount of stool. She notes minimal bright red blood on toilet paper at that time. She admits to straining for this particular bowel movement. She restarted her stool softeners and she is back to normal caliber and consistency of stools. She denies any blood since.  Past Medical History  Diagnosis Date  .  Hypertension   . Reflux   . Hyperlipidemia   . Lung mass 07/28/2014  . Adrenal mass, right ([place]) 07/28/2014  . Diabetes mellitus without complication ([place])   . Cancer [facility])     lung  right     has Adrenal mass, right ([place]); Cancer of upper lobe of right lung ([place]); ARF (acute renal failure) ([place]); Abnormal finding on imaging; Oropharyngeal dysphagia; Nausea and vomiting; Hypercalcemia; Anemia in neoplastic disease; Dysphagia; and Encounter for screening colonoscopy on her problem list.     has No Known Allergies.  Current Outpatient Prescriptions on File Prior to Visit  Medication Sig Dispense Refill  . Fluticasone-Salmeterol (ADVAIR DISKUS) 500-50 MCG/DOSE AEPB Two puffs at the same time daily. 60 each 6  . folic acid (FOLVITE) 1 MG tablet Take 1 mg by mouth every morning.    . hydrochlorothiazide (MICROZIDE) 12.5 MG capsule Take 12.5 mg by mouth every morning.     Marland Kitchen HYDROcodone-homatropine (HYCODAN) 5-1.5 MG/5ML syrup Take 5 mLs by mouth every 6 (six) hours as needed for cough. 200 mL 0  . ibuprofen (ADVIL,MOTRIN) 200 MG tablet Take 200-800 mg by mouth once as needed for headache or mild pain. Reported on 11/18/2015    . Iron-FA-B Cmp-C-Biot-Probiotic (FUSION PLUS) CAPS Take 1 capsule by mouth daily.     Marland Kitchen KLOR-CON M20 20 MEQ tablet TAKE ONE TABLET BY MOUTH TWICE DAILY 60 tablet 2  . lidocaine-prilocaine (EMLA) cream Apply a quarter size amount to port site 1 hour prior to chemo. Do not rub in. Cover with plastic wrap. 30 g 5  . megestrol (MEGACE) 40 MG/ML suspension Take 400 mg by mouth daily.     . metFORMIN (GLUCOPHAGE) 500 MG tablet TAKE ONE TABLET BY MOUTH TWICE DAILY WITH A MEAL 60 tablet 3  . metoCLOPramide (REGLAN) 10 MG tablet TAKE ONE TABLET BY MOUTH 4 TIMES DAILY 120 tablet 0  . nicotine (NICODERM CQ - DOSED IN MG/24 HOURS) 21 mg/24hr patch Place 1 patch onto the skin daily.  0  . Nivolumab (OPDIVO IV) Inject into the vein every 14 (fourteen) days.    Marland Kitchen omeprazole (PRILOSEC) 20 MG capsule TAKE ONE CAPSULE BY MOUTH TWICE DAILYBEFORE A MEAL 60 capsule 11  . ondansetron (ZOFRAN) 8 MG tablet Take 1 tablet (8 mg total) by mouth every 8 (eight) hours as needed for nausea  or vomiting. 60 tablet 1  . Oxycodone HCl 10 MG TABS Take 1-2 tablets (10-20 mg total) by mouth every 6 (six) hours as needed (pain.). 100 tablet 0  . prochlorperazine (COMPAZINE) 10 MG tablet Starting the day after chemo, take 1 tab four times a day x 2 days. Then may take 1 tab four times a day IF needed for nausea/vomiting. 60 tablet 2  . senna (SENOKOT) 8.6 MG tablet Take 2 tablets by mouth at bedtime. Reported on 02/24/2016    . simvastatin (ZOCOR) 40 MG tablet Take 40 mg by mouth every morning.     . triamcinolone (KENALOG) 0.025 % cream Apply 1 application topically 2 (two) times daily. Reported on 02/24/2016    . Alum & Mag Hydroxide-Simeth (MAGIC MOUTHWASH W/LIDOCAINE) SOLN Take 5 mLs by mouth 4 (four) times daily as needed for mouth pain. (Patient not taking: Reported on 03/09/2016) 400 mL 4   No current facility-administered medications on file prior to visit.    Past Surgical History  Procedure Laterality Date  . Appendectomy    . Lung biopsy Right 07/2014    CT guided  .  Portacath placement Left 09/01/2014  . Esophagogastroduodenoscopy N/A 12/09/2014    ZLD:JTTSVX esophageal stricture/mild-to-noderate erosive gastritis. negative H.pylori  . Savory dilation N/A 12/09/2014    Procedure: SAVORY DILATION;  Surgeon: [name], MD;  Location: [facility];  Service: Endoscopy;  Laterality: N/A;  [name] dilation N/A 12/09/2014    Procedure: [name] DILATION;  Surgeon: [name], MD;  Location: [facility];  Service: Endoscopy;  Laterality: N/A;  . Flexible sigmoidoscopy  2011    Dr. [name]: hyperplastic polyp    Denies any headaches, dizziness, double vision, fevers, chills, night sweats, nausea, vomiting, diarrhea, constipation, chest pain, heart palpitations, shortness of breath, blood in stool, black tarry stool, urinary pain, urinary burning, urinary frequency, hematuria.   PHYSICAL EXAMINATION  ECOG PERFORMANCE STATUS: 1 - Symptomatic but completely  ambulatory  Filed Vitals:   03/09/16 1126  BP: 111/74  Pulse: 91  Temp: 98.2 F (36.8 C)  Resp: 18    GENERAL:alert, no distress, cachectic, comfortable, cooperative, smiling and accompanied by Her Husband SKIN: skin color, texture, turgor are normal, no rashes or significant lesions HEAD: Normocephalic, No masses, lesions, tenderness or abnormalities EYES: normal, PERRLA, EOMI, Conjunctiva are pink and non-injected EARS: External ears normal OROPHARYNX:lips, buccal mucosa, and tongue normal and mucous membranes are moist, poor dentition NECK: supple, no adenopathy, thyroid normal size, non-tender, without nodularity, no stridor, non-tender, trachea midline LYMPH:  no palpable lymphadenopathy, no hepatosplenomegaly BREAST:not examined LUNGS: clear to auscultation and percussion without wheezes, rales, or rhonchi. HEART: regular rate & rhythm, no murmurs, no gallops, S1 normal and S2 normal ABDOMEN:abdomen soft, non-tender, normal bowel sounds and no masses or organomegaly BACK: Back symmetric, no curvature., No CVA tenderness EXTREMITIES:less then 2 second capillary refill, no joint deformities, effusion, or inflammation, no edema, no skin discoloration, no cyanosis  NEURO: alert & oriented x 3 with fluent speech, no focal motor/sensory deficits, gait normal    LABORATORY DATA: CBC    Component Value Date/Time   WBC 7.1 03/09/2016 1143   RBC 3.83* 03/09/2016 1143   RBC 3.60* 02/17/2016 0938   HGB 11.9* 03/09/2016 1143   HCT 36.4 03/09/2016 1143   PLT 295 03/09/2016 1143   MCV 95.0 03/09/2016 1143   MCH 31.1 03/09/2016 1143   MCHC 32.7 03/09/2016 1143   RDW 14.0 03/09/2016 1143   LYMPHSABS 1.6 03/09/2016 1143   MONOABS 0.5 03/09/2016 1143   EOSABS 0.1 03/09/2016 1143   BASOSABS 0.0 03/09/2016 1143      Chemistry      Component Value Date/Time   NA 135 03/09/2016 1143   K 4.0 03/09/2016 1143   CL 99* 03/09/2016 1143   CO2 28 03/09/2016 1143   BUN 17 03/09/2016  1143   CREATININE 1.26* 03/09/2016 1143      Component Value Date/Time   CALCIUM 10.4* 03/09/2016 1143   CALCIUM 9.7 06/03/2015 1000   ALKPHOS 55 03/09/2016 1143   AST 14* 03/09/2016 1143   ALT 10* 03/09/2016 1143   BILITOT 0.4 03/09/2016 1143     Lab Results  Component Value Date   TSH 0.415 03/09/2016     PENDING LABS:   RADIOGRAPHIC STUDIES:  Mm Digital Screening Bilateral  02/26/2016  CLINICAL DATA:  Screening. EXAM: DIGITAL SCREENING BILATERAL MAMMOGRAM WITH CAD COMPARISON:  Previous exam(s). ACR Breast Density Category c: The breast tissue is heterogeneously dense, which may obscure small masses. FINDINGS: There are no findings suspicious for malignancy. Images were processed with CAD. IMPRESSION: No mammographic evidence  of malignancy. A result letter of this screening mammogram will be mailed directly to the patient. RECOMMENDATION: Screening mammogram in one year. (Code:[id number]-B-01Y) BI-RADS CATEGORY  1: Negative. Electronically Signed   By: Skipper Cliche M.D.   On: 02/26/2016 09:10     PATHOLOGY:    ASSESSMENT AND PLAN:  Cancer of upper lobe of right lung Stage IV right adenocarcinoma pancoast tumor of the lung (EGFR and ALK negative) complicated by right brachial plexus neuralgia.  S/P XRT.  Currently on Nivolumab salvage therapy with Zometa for bone metastasis and hypercalcemia (on hold presently in preparation for dental surgery).  Oncology history is updated.  Due for treatment today. Pre-chemotherapy labs today satisfy treatment criteria.  TSH has been monitored and WNL most recently.  She continues to smoke.  She is down to smoking 3-4 cigarettes daily (compared to 5 daily in the past).  Smoking cessation encouraged.  ZOMETA on HOLD at this time.  She is status post tooth extraction approximately 2 weeks ago and therefore, we will hold Zometa for 2-4 more weeks. Supportive therapy plan is built for Zometa to be administered in 4 weeks from today. This can be  changed accordingly if necessary.   Hypercalcemia noted. Patient is asymptomatic. Calcium is marginally elevated. We'll not intervene at this time. We'll monitor moving forward.  She had an issue with constipation over the weekend. That is since resolved with reinitiation of stool softeners.  She will be due for restaging imaging in June 2017.  Return in 4 weeks for follow-up with continuation of pre-chemo labs and treatment every 2 weeks.     THERAPY PLAN:  Continue treatment as planned.  All questions were answered. The patient knows to call the clinic with any problems, questions or concerns. We can certainly see the patient much sooner if necessary.  Patient and plan discussed with Dr. Ancil Linsey and she is in agreement with the aforementioned.   This note is electronically signed by: Doy Mince 03/09/2016 5:42 PM

## 2016-03-08 NOTE — Assessment & Plan Note (Addendum)
Stage IV right adenocarcinoma pancoast tumor of the lung (EGFR and ALK negative) complicated by right brachial plexus neuralgia.  S/P XRT.  Currently on Nivolumab salvage therapy with Zometa for bone metastasis and hypercalcemia (on hold presently in preparation for dental surgery).  Oncology history is updated.  Due for treatment today. Pre-chemotherapy labs today satisfy treatment criteria.  TSH has been monitored and WNL most recently.  She continues to smoke.  She is down to smoking 3-4 cigarettes daily (compared to 5 daily in the past).  Smoking cessation encouraged.  ZOMETA on HOLD at this time.  She is status post tooth extraction approximately 2 weeks ago and therefore, we will hold Zometa for 2-4 more weeks. Supportive therapy plan is built for Zometa to be administered in 4 weeks from today. This can be changed accordingly if necessary.   Hypercalcemia noted. Patient is asymptomatic. Calcium is marginally elevated. We'll not intervene at this time. We'll monitor moving forward.  She had an issue with constipation over the weekend. That is since resolved with reinitiation of stool softeners.  She will be due for restaging imaging in June 2017.  Return in 4 weeks for follow-up with continuation of pre-chemo labs and treatment every 2 weeks.

## 2016-03-09 ENCOUNTER — Encounter (HOSPITAL_COMMUNITY): Payer: Self-pay | Admitting: Oncology

## 2016-03-09 ENCOUNTER — Encounter (HOSPITAL_BASED_OUTPATIENT_CLINIC_OR_DEPARTMENT_OTHER): Payer: 59 | Admitting: Oncology

## 2016-03-09 ENCOUNTER — Encounter (HOSPITAL_BASED_OUTPATIENT_CLINIC_OR_DEPARTMENT_OTHER): Payer: 59

## 2016-03-09 VITALS — BP 106/64 | HR 88 | Temp 98.4°F | Resp 16

## 2016-03-09 VITALS — BP 111/74 | HR 91 | Temp 98.2°F | Resp 18

## 2016-03-09 DIAGNOSIS — C3411 Malignant neoplasm of upper lobe, right bronchus or lung: Secondary | ICD-10-CM | POA: Diagnosis not present

## 2016-03-09 DIAGNOSIS — Z5112 Encounter for antineoplastic immunotherapy: Secondary | ICD-10-CM | POA: Diagnosis not present

## 2016-03-09 DIAGNOSIS — Z720 Tobacco use: Secondary | ICD-10-CM

## 2016-03-09 DIAGNOSIS — E279 Disorder of adrenal gland, unspecified: Secondary | ICD-10-CM | POA: Diagnosis not present

## 2016-03-09 DIAGNOSIS — E278 Other specified disorders of adrenal gland: Secondary | ICD-10-CM

## 2016-03-09 DIAGNOSIS — C7951 Secondary malignant neoplasm of bone: Secondary | ICD-10-CM

## 2016-03-09 LAB — COMPREHENSIVE METABOLIC PANEL
ALT: 10 U/L — ABNORMAL LOW (ref 14–54)
AST: 14 U/L — AB (ref 15–41)
Albumin: 4.4 g/dL (ref 3.5–5.0)
Alkaline Phosphatase: 55 U/L (ref 38–126)
Anion gap: 8 (ref 5–15)
BUN: 17 mg/dL (ref 6–20)
CHLORIDE: 99 mmol/L — AB (ref 101–111)
CO2: 28 mmol/L (ref 22–32)
Calcium: 10.4 mg/dL — ABNORMAL HIGH (ref 8.9–10.3)
Creatinine, Ser: 1.26 mg/dL — ABNORMAL HIGH (ref 0.44–1.00)
GFR, EST AFRICAN AMERICAN: 53 mL/min — AB (ref >60)
GFR, EST NON AFRICAN AMERICAN: 46 mL/min — AB (ref >60)
Glucose, Bld: 100 mg/dL — ABNORMAL HIGH (ref 65–99)
Potassium: 4 mmol/L (ref 3.5–5.1)
Sodium: 135 mmol/L (ref 135–145)
Total Bilirubin: 0.4 mg/dL (ref 0.3–1.2)
Total Protein: 8.5 g/dL — ABNORMAL HIGH (ref 6.5–8.1)

## 2016-03-09 LAB — CBC WITH DIFFERENTIAL/PLATELET
BASOS ABS: 0 10*3/uL (ref 0.0–0.1)
Basophils Relative: 0 %
EOS PCT: 1 %
Eosinophils Absolute: 0.1 10*3/uL (ref 0.0–0.7)
HCT: 36.4 % (ref 36.0–46.0)
Hemoglobin: 11.9 g/dL — ABNORMAL LOW (ref 12.0–15.0)
LYMPHS PCT: 23 %
Lymphs Abs: 1.6 10*3/uL (ref 0.7–4.0)
MCH: 31.1 pg (ref 26.0–34.0)
MCHC: 32.7 g/dL (ref 30.0–36.0)
MCV: 95 fL (ref 78.0–100.0)
MONO ABS: 0.5 10*3/uL (ref 0.1–1.0)
Monocytes Relative: 6 %
Neutro Abs: 4.9 10*3/uL (ref 1.7–7.7)
Neutrophils Relative %: 70 %
PLATELETS: 295 10*3/uL (ref 150–400)
RBC: 3.83 MIL/uL — ABNORMAL LOW (ref 3.87–5.11)
RDW: 14 % (ref 11.5–15.5)
WBC: 7.1 10*3/uL (ref 4.0–10.5)

## 2016-03-09 LAB — TSH: TSH: 0.415 u[IU]/mL (ref 0.350–4.500)

## 2016-03-09 MED ORDER — HEPARIN SOD (PORK) LOCK FLUSH 100 UNIT/ML IV SOLN
500.0000 [IU] | Freq: Once | INTRAVENOUS | Status: AC | PRN
  Administered 2016-03-09: 500 [IU]

## 2016-03-09 MED ORDER — SODIUM CHLORIDE 0.9 % IV SOLN
Freq: Once | INTRAVENOUS | Status: AC
  Administered 2016-03-09: 13:00:00 via INTRAVENOUS

## 2016-03-09 MED ORDER — SODIUM CHLORIDE 0.9 % IJ SOLN
10.0000 mL | INTRAMUSCULAR | Status: DC | PRN
  Administered 2016-03-09: 10 mL
  Filled 2016-03-09: qty 10

## 2016-03-09 MED ORDER — HEPARIN SOD (PORK) LOCK FLUSH 100 UNIT/ML IV SOLN
INTRAVENOUS | Status: AC
  Filled 2016-03-09: qty 5

## 2016-03-09 MED ORDER — SODIUM CHLORIDE 0.9 % IV SOLN
240.0000 mg | Freq: Once | INTRAVENOUS | Status: AC
  Administered 2016-03-09: 240 mg via INTRAVENOUS
  Filled 2016-03-09: qty 20

## 2016-03-09 NOTE — Patient Instructions (Signed)
[place] at [facility] Discharge Instructions  RECOMMENDATIONS MADE BY THE CONSULTANT AND ANY TEST RESULTS WILL BE SENT TO YOUR REFERRING PHYSICIAN.  Treatment today Hold zometa due to dental work Labs in 2 weeks  Return 4 weeks for follow up Zometa to restart in 4 weeks  Thank you for choosing [place] at [facility] to provide your oncology and hematology care.  To afford each patient quality time with our provider, please arrive at least 15 minutes before your scheduled appointment time.   Beginning January 23rd 2017 lab work for the [facility] will be done in the  Main lab at [facility] on 1st floor. If you have a lab appointment with the [place] please come in thru the  Main Entrance and check in at the main information desk  You need to re-schedule your appointment should you arrive 10 or more minutes late.  We strive to give you quality time with our providers, and arriving late affects you and other patients whose appointments are after yours.  Also, if you no show three or more times for appointments you may be dismissed from the clinic at the providers discretion.     Again, thank you for choosing [facility].  Our hope is that these requests will decrease the amount of time that you wait before being seen by our physicians.       _____________________________________________________________  Should you have questions after your visit to [facility], please contact our office at [phone] between the hours of 8:30 a.m. and 4:30 p.m.  Voicemails left after 4:30 p.m. will not be returned until the following business day.  For prescription refill requests, have your pharmacy contact our office.         Resources For Cancer Patients and their Caregivers ? American Cancer Society: Can assist with transportation, wigs, general needs, runs Look Good Feel Better.         (409)302-3628 ? Cancer Care: Provides financial assistance, online support groups, medication/co-pay assistance.  1-800-813-HOPE 8670155223) ? [facility] Assists [place] cancer patients and their families through emotional , educational and financial support.  925-124-1592 ? [place] DSS Where to apply for food stamps, Medicaid and utility assistance. (260)682-7003 ? RCATS: Transportation to medical appointments. 435-385-3555 ? Social Security Administration: May apply for disability if have a Stage IV cancer. 810-872-0278 (231) 196-7154 ? [employer], Disability and Transit Services: Assists with nutrition, care and transit needs. [phone])240-4133

## 2016-03-09 NOTE — Progress Notes (Signed)
No zometa today per T.Kefalas,PA.  Tolerated chemo well. Ambulatory on discharge home with family.

## 2016-03-23 ENCOUNTER — Encounter (HOSPITAL_COMMUNITY): Payer: 59 | Attending: Hematology and Oncology

## 2016-03-23 VITALS — BP 114/52 | HR 68 | Temp 98.1°F | Resp 16 | Wt 89.6 lb

## 2016-03-23 DIAGNOSIS — Z9089 Acquired absence of other organs: Secondary | ICD-10-CM | POA: Diagnosis not present

## 2016-03-23 DIAGNOSIS — J449 Chronic obstructive pulmonary disease, unspecified: Secondary | ICD-10-CM | POA: Diagnosis not present

## 2016-03-23 DIAGNOSIS — C3411 Malignant neoplasm of upper lobe, right bronchus or lung: Secondary | ICD-10-CM | POA: Insufficient documentation

## 2016-03-23 DIAGNOSIS — C7971 Secondary malignant neoplasm of right adrenal gland: Secondary | ICD-10-CM

## 2016-03-23 DIAGNOSIS — Z5112 Encounter for antineoplastic immunotherapy: Secondary | ICD-10-CM | POA: Diagnosis not present

## 2016-03-23 DIAGNOSIS — C7972 Secondary malignant neoplasm of left adrenal gland: Secondary | ICD-10-CM | POA: Diagnosis not present

## 2016-03-23 DIAGNOSIS — I10 Essential (primary) hypertension: Secondary | ICD-10-CM | POA: Insufficient documentation

## 2016-03-23 DIAGNOSIS — E278 Other specified disorders of adrenal gland: Secondary | ICD-10-CM

## 2016-03-23 DIAGNOSIS — E785 Hyperlipidemia, unspecified: Secondary | ICD-10-CM | POA: Insufficient documentation

## 2016-03-23 DIAGNOSIS — C772 Secondary and unspecified malignant neoplasm of intra-abdominal lymph nodes: Secondary | ICD-10-CM | POA: Insufficient documentation

## 2016-03-23 DIAGNOSIS — F17210 Nicotine dependence, cigarettes, uncomplicated: Secondary | ICD-10-CM | POA: Diagnosis not present

## 2016-03-23 DIAGNOSIS — C781 Secondary malignant neoplasm of mediastinum: Secondary | ICD-10-CM | POA: Diagnosis not present

## 2016-03-23 DIAGNOSIS — C7951 Secondary malignant neoplasm of bone: Secondary | ICD-10-CM

## 2016-03-23 DIAGNOSIS — C7989 Secondary malignant neoplasm of other specified sites: Secondary | ICD-10-CM | POA: Insufficient documentation

## 2016-03-23 DIAGNOSIS — K219 Gastro-esophageal reflux disease without esophagitis: Secondary | ICD-10-CM | POA: Insufficient documentation

## 2016-03-23 DIAGNOSIS — Z79899 Other long term (current) drug therapy: Secondary | ICD-10-CM | POA: Insufficient documentation

## 2016-03-23 LAB — CBC WITH DIFFERENTIAL/PLATELET
Basophils Absolute: 0 10*3/uL (ref 0.0–0.1)
Basophils Relative: 0 %
EOS PCT: 2 %
Eosinophils Absolute: 0.2 10*3/uL (ref 0.0–0.7)
HCT: 35.7 % — ABNORMAL LOW (ref 36.0–46.0)
Hemoglobin: 11.7 g/dL — ABNORMAL LOW (ref 12.0–15.0)
LYMPHS ABS: 2.1 10*3/uL (ref 0.7–4.0)
LYMPHS PCT: 25 %
MCH: 31.1 pg (ref 26.0–34.0)
MCHC: 32.8 g/dL (ref 30.0–36.0)
MCV: 94.9 fL (ref 78.0–100.0)
MONO ABS: 0.5 10*3/uL (ref 0.1–1.0)
Monocytes Relative: 6 %
Neutro Abs: 5.8 10*3/uL (ref 1.7–7.7)
Neutrophils Relative %: 67 %
PLATELETS: 267 10*3/uL (ref 150–400)
RBC: 3.76 MIL/uL — ABNORMAL LOW (ref 3.87–5.11)
RDW: 14.2 % (ref 11.5–15.5)
WBC: 8.6 10*3/uL (ref 4.0–10.5)

## 2016-03-23 LAB — COMPREHENSIVE METABOLIC PANEL
ALBUMIN: 4 g/dL (ref 3.5–5.0)
ALK PHOS: 55 U/L (ref 38–126)
ALT: 10 U/L — AB (ref 14–54)
ANION GAP: 5 (ref 5–15)
AST: 16 U/L (ref 15–41)
BUN: 17 mg/dL (ref 6–20)
CALCIUM: 10.3 mg/dL (ref 8.9–10.3)
CO2: 26 mmol/L (ref 22–32)
Chloride: 104 mmol/L (ref 101–111)
Creatinine, Ser: 1.07 mg/dL — ABNORMAL HIGH (ref 0.44–1.00)
GFR calc Af Amer: >60 mL/min (ref >60)
GFR calc non Af Amer: 56 mL/min — ABNORMAL LOW (ref >60)
GLUCOSE: 105 mg/dL — AB (ref 65–99)
Potassium: 4.4 mmol/L (ref 3.5–5.1)
SODIUM: 135 mmol/L (ref 135–145)
Total Bilirubin: 0.2 mg/dL — ABNORMAL LOW (ref 0.3–1.2)
Total Protein: 7.5 g/dL (ref 6.5–8.1)

## 2016-03-23 MED ORDER — SODIUM CHLORIDE 0.9 % IV SOLN
240.0000 mg | Freq: Once | INTRAVENOUS | Status: AC
  Administered 2016-03-23: 240 mg via INTRAVENOUS
  Filled 2016-03-23: qty 20

## 2016-03-23 MED ORDER — SODIUM CHLORIDE 0.9 % IJ SOLN: 10.0000 mL | INTRAMUSCULAR | Status: DC | PRN

## 2016-03-23 MED ORDER — HEPARIN SOD (PORK) LOCK FLUSH 100 UNIT/ML IV SOLN
500.0000 [IU] | Freq: Once | INTRAVENOUS | Status: AC | PRN
  Administered 2016-03-23: 500 [IU]

## 2016-03-23 MED ORDER — OXYCODONE HCL 10 MG PO TABS: 10.0000 mg | ORAL_TABLET | Freq: Four times a day (QID) | ORAL | Status: DC | PRN

## 2016-03-23 MED ORDER — SODIUM CHLORIDE 0.9 % IV SOLN
Freq: Once | INTRAVENOUS | Status: AC
  Administered 2016-03-23: 15:00:00 via INTRAVENOUS

## 2016-03-23 NOTE — Progress Notes (Signed)
Melissa Sullivan presented for labwork. Labs per MD order drawn via Peripheral Line 23 gauge needle inserted in right antecubital.  Good blood return present. Procedure without incident.  Needle removed intact. Patient tolerated procedure well.  Tolerated chemo well. Ambulatory on discharge home with family.

## 2016-03-23 NOTE — Patient Instructions (Signed)
[facility] Discharge Instructions for Patients Receiving Chemotherapy   Beginning January 23rd 2017 lab work for the Cancer Center will be done in the  Main lab at [place] on 1st floor. If you have a lab appointment with the Cancer Center please come in thru the  Main Entrance and check in at the main information desk   Today you received the following chemotherapy agents:  Opdivo  If you develop nausea and vomiting, or diarrhea that is not controlled by your medication, call the clinic.  The clinic phone number is [phone]. Office hours are Monday-Friday 8:30am-5:00pm.  BELOW ARE SYMPTOMS THAT SHOULD BE REPORTED IMMEDIATELY:  *FEVER GREATER THAN 101.0 F  *CHILLS WITH OR WITHOUT FEVER  NAUSEA AND VOMITING THAT IS NOT CONTROLLED WITH YOUR NAUSEA MEDICATION  *UNUSUAL SHORTNESS OF BREATH  *UNUSUAL BRUISING OR BLEEDING  TENDERNESS IN MOUTH AND THROAT WITH OR WITHOUT PRESENCE OF ULCERS  *URINARY PROBLEMS  *BOWEL PROBLEMS  UNUSUAL RASH Items with * indicate a potential emergency and should be followed up as soon as possible. If you have an emergency after office hours please contact your primary care physician or go to the nearest emergency department.  Please call the clinic during office hours if you have any questions or concerns.   You may also contact the Patient Navigator at [phone] should you have any questions or need assistance in obtaining follow up care.      Resources For Cancer Patients and their Caregivers ? American Cancer Society: Can assist with transportation, wigs, general needs, runs Look Good Feel Better.        1-888-227-6333 ? Cancer Care: Provides financial assistance, online support groups, medication/co-pay assistance.  1-800-813-HOPE (4673) ? [facility] Assists [place] cancer patients and their families through emotional , educational and financial support.   336-427-4357 ? [place] DSS Where to apply for food stamps, Medicaid and utility assistance. 336-342-1394 ? RCATS: Transportation to medical appointments. 336-347-2287 ? Social Security Administration: May apply for disability if have a Stage IV cancer. 336-342-7796 1-800-772-1213 ? [place] Aging, Disability and Transit Services: Assists with nutrition, care and transit needs. 336-349-2343         

## 2016-03-24 ENCOUNTER — Encounter (HOSPITAL_COMMUNITY): Payer: Self-pay | Admitting: Hematology & Oncology

## 2016-03-28 ENCOUNTER — Other Ambulatory Visit (HOSPITAL_COMMUNITY): Payer: Self-pay | Admitting: Oncology

## 2016-04-05 ENCOUNTER — Encounter (HOSPITAL_COMMUNITY): Payer: Self-pay | Admitting: Oncology

## 2016-04-05 DIAGNOSIS — C7951 Secondary malignant neoplasm of bone: Secondary | ICD-10-CM

## 2016-04-05 HISTORY — DX: Secondary malignant neoplasm of bone: C79.51

## 2016-04-05 NOTE — Assessment & Plan Note (Addendum)
Bone metastases on original staging PET scan.    Due to restart Zometa.  Based upon her labs, she is a candidate for Zometa 3.3 mg with a calculated CrCl of 40.2 mL/min.

## 2016-04-05 NOTE — Assessment & Plan Note (Addendum)
Stage IV right adenocarcinoma pancoast tumor of the lung (EGFR and ALK negative) complicated by right brachial plexus neuralgia.  S/P XRT.  Currently on Nivolumab salvage therapy with Zometa for bone metastasis and hypercalcemia (on hold presently in preparation for dental surgery).  Oncology history is updated.  Due for treatment today. Pre-chemotherapy labs today satisfy treatment criteria.  TSH has been monitored and WNL most recently.  She continues to smoke.  She is down to smoking 3-4 cigarettes daily (compared to 5 daily in the past).  Smoking cessation encouraged.  ZOMETA will be restarted today.  She is 6 weeks out from tooth extraction and intra-orally, she is healed.  Zometa will be restarted today.   She will be due for restaging imaging in June 2017.  Return in 4 weeks for follow-up with continuation of pre-chemo labs and treatment every 2 weeks.

## 2016-04-05 NOTE — Progress Notes (Signed)
      Zack Hall, MD [address] [place] [address]  Cancer of upper lobe of right lung (HCC) - Plan: CT Abdomen Pelvis W Contrast, CT Chest W Contrast  Bone metastases (HCC) - Plan: CT Abdomen Pelvis W Contrast, CT Chest W Contrast  CURRENT THERAPY: Nivolumab every 2 weeks.Zometa every 4 weeks is restarted today, 04/06/2016, after being held for tooth extraction.  INTERVAL HISTORY: Melissa Sullivan 59 y.o. 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     Cancer of upper lobe of right lung (HCC)   07/28/2014 Imaging CT chest: Large R apical mass consistent with malignancy. This is destroying the R 2nd rib with extension into adjacent soft tissue. R hilar adenopathy with R 5cm adrenal metastatic lesion.   08/01/2014 Initial Biopsy Lung, needle/core biopsy(ies), right upper lobe - POORLY DIFFERENTIATED ADENOCARCINOMA, SEE COMMENT.   08/08/2014 PET scan Large hypermetabolic R apical mass with evidence of direct chest wall and mediastinal invasion, right retrocrural lymphadenopathy, extensive retroperitoneal lymphadenopathy, and metastatic lesions to the adrenal glands    09/02/2014 - 11/04/2014 Chemotherapy Cisplatin/Pemetrexed/Avastin every 21 days x 4 cycles   10/07/2014 - 10/27/2014 Radiation Therapy Right lung apex for control of brachioplexopathy.   12/24/2014 - 02/25/2015 Chemotherapy Alimta/Avastin every 21 days.   02/20/2015 Imaging Increase in size of right adrenal metastasis and subjacent confluent retrocaval lymphadenopathy   02/25/2015 -  Chemotherapy Nivolumab, zometa   05/04/2015 Imaging CT CAP- Stable to slight decrease in the posterior right apical lesion. Stable appearance of posterior right upper rib an upper thoracic bony lesions. Slight improvement in right upper lobe tree-in-bud opacity. No new or progressive findings in...   07/28/2015 Imaging CT CAP- Reduced  size of the right apical pleural parenchymal lesion and reduced size of the right adrenal metastatic lesion. Resolution of prior retrocrural adenopathy.  Right eccentric T1 and T2 sclerosis with sclerosis and tapering of the right second..   11/17/2015 Imaging CT CAP- Stable soft tissue thickening in the apex of the right hemi thorax. Stable right adrenal metastasis. Nodularity along the trachea and mainstem bronchi, relatively new from 07/28/2015, favoring adherent debris.   11/18/2015 Treatment Plan Change Zometa HELD for upcoming tooth extraction   11/24/2015 Treatment Plan Change Zometa on hold at this time in preparation for tooth extraction in March 2017.  Zometa las given on 11/18/2015.   02/03/2016 Imaging CT CAP- Heterogeneous right apical masslike consolidation and right adrenal metastasis are unchanged    Treatment Plan Change Zometa restarted 6 weeks out from tooth extraction (04/06/2016)    I personally reviewed and went over laboratory results with the patient.  The results are noted within this dictation.  Labs are updated today and her Zometa restart is dosed based upon her renal function today: 40.2 mL/min.  She is doing well.  She denies any complaints.  She continues to smoke and "bum cigarettes from Mom."  She no longer buys cigarettes.  She notes that she is working on cessation.  She notes that she smoke 3-4 cigarettes per day.   Review of Systems  Constitutional: Negative.  Negative for fever, chills and weight loss.  Eyes: Negative.  Negative for blurred vision and double vision.  Respiratory: Negative.  Negative for cough and hemoptysis.   Cardiovascular: Negative.   Gastrointestinal: Negative.  Negative for nausea, vomiting, abdominal pain, diarrhea and constipation.  Genitourinary: Negative.  Negative for   dysuria and urgency.  Musculoskeletal: Negative.  Negative for falls.  Skin: Negative.   Neurological: Negative.  Negative for headaches.  Endo/Heme/Allergies: Negative.    Psychiatric/Behavioral: Negative.     Past Medical History  Diagnosis Date  . Hypertension   . Reflux   . Hyperlipidemia   . Lung mass 07/28/2014  . Adrenal mass, right (HCC) 07/28/2014  . Diabetes mellitus without complication (HCC)   . Cancer (HCC)     lung  right  . Bone metastases (HCC) 04/05/2016    Past Surgical History  Procedure Laterality Date  . Appendectomy    . Lung biopsy Right 07/2014    CT guided  . Portacath placement Left 09/01/2014  . Esophagogastroduodenoscopy N/A 12/09/2014    SLF:distal esophageal stricture/mild-to-noderate erosive gastritis. negative H.pylori  . Savory dilation N/A 12/09/2014    Procedure: SAVORY DILATION;  Surgeon: Sandi L Fields, MD;  [facility];  Service: Endoscopy;  Laterality: N/A;  . Maloney dilation N/A 12/09/2014    Procedure: MALONEY DILATION;  Surgeon: Sandi L Fields, MD;  Location: [facility];  Service: Endoscopy;  Laterality: N/A;  . Flexible sigmoidoscopy  2011    Dr. Fields: hyperplastic polyp    Family History  Problem Relation Age of Onset  . Cancer Sister     Social History   Social History  . Marital Status: Married    Spouse Name: N/A  . Number of Children: N/A  . Years of Education: N/A   Social History Main Topics  . Smoking status: Light Tobacco Smoker -- 0.30 packs/day    Types: Cigarettes  . Smokeless tobacco: Never Used  . Alcohol Use: No  . Drug Use: No  . Sexual Activity: Not on file   Other Topics Concern  . Not on file   Social History Narrative     PHYSICAL EXAMINATION  ECOG PERFORMANCE STATUS: 1 - Symptomatic but completely ambulatory  Filed Vitals:   04/06/16 0834  BP: 98/78  Pulse: 83  Temp: 98.3 F (36.8 C)  Resp: 20    GENERAL:alert, no distress, well nourished, well developed, cachectic, comfortable, cooperative, smiling and accompanied by his daughter and son-in-law. SKIN: skin color, texture, turgor are normal, no rashes or significant lesions HEAD:  Normocephalic, No masses, lesions, tenderness or abnormalities EYES: normal, EOMI, Conjunctiva are pink and non-injected EARS: External ears normal OROPHARYNX:lips, buccal mucosa, and tongue normal and mucous membranes are moist  NECK: supple, trachea midline LYMPH:  no palpable lymphadenopathy BREAST:not examined LUNGS: clear to auscultation and percussion HEART: regular rate & rhythm, no murmurs, no gallops, S1 normal and S2 normal ABDOMEN:abdomen soft and normal bowel sounds BACK: Back symmetric, no curvature. EXTREMITIES:less then 2 second capillary refill, no joint deformities, effusion, or inflammation, no edema, no skin discoloration, no cyanosis  NEURO: alert & oriented x 3 with fluent speech, no focal motor/sensory deficits, gait normal   LABORATORY DATA: CBC    Component Value Date/Time   WBC 6.8 04/06/2016 0856   RBC 3.76* 04/06/2016 0856   RBC 3.60* 02/17/2016 0938   HGB 11.9* 04/06/2016 0856   HCT 35.3* 04/06/2016 0856   PLT 296 04/06/2016 0856   MCV 93.9 04/06/2016 0856   MCH 31.6 04/06/2016 0856   MCHC 33.7 04/06/2016 0856   RDW 14.2 04/06/2016 0856   LYMPHSABS 2.4 04/06/2016 0856   MONOABS 0.6 04/06/2016 0856   EOSABS 0.2 04/06/2016 0856   BASOSABS 0.0 04/06/2016 0856      Chemistry        Component Value Date/Time   NA 134* 04/06/2016 0856   K 3.7 04/06/2016 0856   CL 98* 04/06/2016 0856   CO2 29 04/06/2016 0856   BUN 21* 04/06/2016 0856   CREATININE 1.21* 04/06/2016 0856      Component Value Date/Time   CALCIUM 10.3 04/06/2016 0856   CALCIUM 9.7 06/03/2015 1000   ALKPHOS 53 04/06/2016 0856   AST 14* 04/06/2016 0856   ALT 8* 04/06/2016 0856   BILITOT 0.3 04/06/2016 0856        PENDING LABS:   RADIOGRAPHIC STUDIES:  No results found.   PATHOLOGY:    ASSESSMENT AND PLAN:  Cancer of upper lobe of right lung Stage IV right adenocarcinoma pancoast tumor of the lung (EGFR and ALK negative) complicated by right brachial plexus neuralgia.   S/P XRT.  Currently on Nivolumab salvage therapy with Zometa for bone metastasis and hypercalcemia (on hold presently in preparation for dental surgery).  Oncology history is updated.  Due for treatment today. Pre-chemotherapy labs today satisfy treatment criteria.  TSH has been monitored and WNL most recently.  She continues to smoke.  She is down to smoking 3-4 cigarettes daily (compared to 5 daily in the past).  Smoking cessation encouraged.  ZOMETA will be restarted today.  She is 6 weeks out from tooth extraction and intra-orally, she is healed.  Zometa will be restarted today.   She will be due for restaging imaging in June 2017.  Return in 4 weeks for follow-up with continuation of pre-chemo labs and treatment every 2 weeks.     Bone metastases (HCC) Bone metastases on original staging PET scan.    Due to restart Zometa.  Based upon her labs, she is a candidate for Zometa 3.3 mg with a calculated CrCl of 40.2 mL/min.    ORDERS PLACED FOR THIS ENCOUNTER: Orders Placed This Encounter  Procedures  . CT Abdomen Pelvis W Contrast  . CT Chest W Contrast    MEDICATIONS PRESCRIBED THIS ENCOUNTER: No orders of the defined types were placed in this encounter.    THERAPY PLAN:  Continue treatment as planned.  She is due for restaging tests in June 2017.  All questions were answered. The patient knows to call the clinic with any problems, questions or concerns. We can certainly see the patient much sooner if necessary.  Patient and plan discussed with Dr. Shannon Penland and she is in agreement with the aforementioned.   This note is electronically signed by: KEFALAS,THOMAS, PA-C 04/06/2016 9:51 AM   

## 2016-04-06 ENCOUNTER — Encounter (HOSPITAL_BASED_OUTPATIENT_CLINIC_OR_DEPARTMENT_OTHER): Payer: 59

## 2016-04-06 ENCOUNTER — Encounter (HOSPITAL_BASED_OUTPATIENT_CLINIC_OR_DEPARTMENT_OTHER): Payer: 59 | Admitting: Oncology

## 2016-04-06 ENCOUNTER — Encounter (HOSPITAL_COMMUNITY): Payer: 59

## 2016-04-06 VITALS — BP 98/78 | HR 83 | Temp 98.3°F | Resp 20 | Wt 88.7 lb

## 2016-04-06 VITALS — BP 104/64 | HR 64 | Resp 18

## 2016-04-06 DIAGNOSIS — C3411 Malignant neoplasm of upper lobe, right bronchus or lung: Secondary | ICD-10-CM | POA: Diagnosis not present

## 2016-04-06 DIAGNOSIS — Z5112 Encounter for antineoplastic immunotherapy: Secondary | ICD-10-CM

## 2016-04-06 DIAGNOSIS — C7951 Secondary malignant neoplasm of bone: Secondary | ICD-10-CM | POA: Diagnosis not present

## 2016-04-06 DIAGNOSIS — E278 Other specified disorders of adrenal gland: Secondary | ICD-10-CM

## 2016-04-06 DIAGNOSIS — Z720 Tobacco use: Secondary | ICD-10-CM | POA: Diagnosis not present

## 2016-04-06 LAB — COMPREHENSIVE METABOLIC PANEL
ALT: 8 U/L — ABNORMAL LOW (ref 14–54)
ANION GAP: 7 (ref 5–15)
AST: 14 U/L — ABNORMAL LOW (ref 15–41)
Albumin: 4.1 g/dL (ref 3.5–5.0)
Alkaline Phosphatase: 53 U/L (ref 38–126)
BILIRUBIN TOTAL: 0.3 mg/dL (ref 0.3–1.2)
BUN: 21 mg/dL — ABNORMAL HIGH (ref 6–20)
CHLORIDE: 98 mmol/L — AB (ref 101–111)
CO2: 29 mmol/L (ref 22–32)
Calcium: 10.3 mg/dL (ref 8.9–10.3)
Creatinine, Ser: 1.21 mg/dL — ABNORMAL HIGH (ref 0.44–1.00)
GFR calc Af Amer: 56 mL/min — ABNORMAL LOW (ref >60)
GFR, EST NON AFRICAN AMERICAN: 48 mL/min — AB (ref >60)
Glucose, Bld: 67 mg/dL (ref 65–99)
POTASSIUM: 3.7 mmol/L (ref 3.5–5.1)
Sodium: 134 mmol/L — ABNORMAL LOW (ref 135–145)
TOTAL PROTEIN: 7.6 g/dL (ref 6.5–8.1)

## 2016-04-06 LAB — CBC WITH DIFFERENTIAL/PLATELET
BASOS ABS: 0 10*3/uL (ref 0.0–0.1)
BASOS PCT: 0 %
EOS PCT: 2 %
Eosinophils Absolute: 0.2 10*3/uL (ref 0.0–0.7)
HEMATOCRIT: 35.3 % — AB (ref 36.0–46.0)
Hemoglobin: 11.9 g/dL — ABNORMAL LOW (ref 12.0–15.0)
Lymphocytes Relative: 35 %
Lymphs Abs: 2.4 10*3/uL (ref 0.7–4.0)
MCH: 31.6 pg (ref 26.0–34.0)
MCHC: 33.7 g/dL (ref 30.0–36.0)
MCV: 93.9 fL (ref 78.0–100.0)
MONO ABS: 0.6 10*3/uL (ref 0.1–1.0)
MONOS PCT: 8 %
NEUTROS ABS: 3.7 10*3/uL (ref 1.7–7.7)
Neutrophils Relative %: 55 %
PLATELETS: 296 10*3/uL (ref 150–400)
RBC: 3.76 MIL/uL — ABNORMAL LOW (ref 3.87–5.11)
RDW: 14.2 % (ref 11.5–15.5)
WBC: 6.8 10*3/uL (ref 4.0–10.5)

## 2016-04-06 LAB — TSH: TSH: 1.654 u[IU]/mL (ref 0.350–4.500)

## 2016-04-06 MED ORDER — SODIUM CHLORIDE 0.9 % IJ SOLN: 10.0000 mL | INTRAMUSCULAR | Status: DC | PRN

## 2016-04-06 MED ORDER — NIVOLUMAB CHEMO INJECTION 100 MG/10ML
240.0000 mg | Freq: Once | INTRAVENOUS | Status: AC
  Administered 2016-04-06: 240 mg via INTRAVENOUS
  Filled 2016-04-06: qty 20

## 2016-04-06 MED ORDER — HEPARIN SOD (PORK) LOCK FLUSH 100 UNIT/ML IV SOLN
500.0000 [IU] | Freq: Once | INTRAVENOUS | Status: AC | PRN
  Administered 2016-04-06: 500 [IU]

## 2016-04-06 MED ORDER — SODIUM CHLORIDE 0.9 % IV SOLN
Freq: Once | INTRAVENOUS | Status: AC
  Administered 2016-04-06: 10:00:00 via INTRAVENOUS

## 2016-04-06 MED ORDER — ZOLEDRONIC ACID 4 MG/5ML IV CONC
3.0000 mg | Freq: Once | INTRAVENOUS | Status: AC
  Administered 2016-04-06: 3 mg via INTRAVENOUS
  Filled 2016-04-06: qty 3.75

## 2016-04-06 NOTE — Patient Instructions (Signed)
[place] at [facility] Discharge Instructions  RECOMMENDATIONS MADE BY THE CONSULTANT AND ANY TEST RESULTS WILL BE SENT TO YOUR REFERRING PHYSICIAN. Exam and discussion today with Kirby Crigler, PA. CT scans next month. Zometa infusion today and every 4 weeks. Opdivo infusion today. Pre-treatment labs every 2 weeks. Office visit in 4 weeks.  Call the clinic with any questions or concerns.   Thank you for choosing [place] at [facility] to provide your oncology and hematology care.  To afford each patient quality time with our provider, please arrive at least 15 minutes before your scheduled appointment time.   Beginning January 23rd 2017 lab work for the [facility] will be done in the  Main lab at [facility] on 1st floor. If you have a lab appointment with the [place] please come in thru the  Main Entrance and check in at the main information desk  You need to re-schedule your appointment should you arrive 10 or more minutes late.  We strive to give you quality time with our providers, and arriving late affects you and other patients whose appointments are after yours.  Also, if you no show three or more times for appointments you may be dismissed from the clinic at the providers discretion.     Again, thank you for choosing [facility].  Our hope is that these requests will decrease the amount of time that you wait before being seen by our physicians.       _____________________________________________________________  Should you have questions after your visit to [facility], please contact our office at [phone] between the hours of 8:30 a.m. and 4:30 p.m.  Voicemails left after 4:30 p.m. will not be returned until the following business day.  For prescription refill requests, have your pharmacy contact our office.         Resources For Cancer Patients and their Caregivers ? American Cancer  Society: Can assist with transportation, wigs, general needs, runs Look Good Feel Better.        564-043-6284 ? Cancer Care: Provides financial assistance, online support groups, medication/co-pay assistance.  1-800-813-HOPE (986)469-3822) ? [place] Assists [place] cancer patients and their families through emotional , educational and financial support.  (606) 485-1442 ? [place] DSS Where to apply for food stamps, Medicaid and utility assistance. (334) 693-1630 ? RCATS: Transportation to medical appointments. (516)269-7104 ? Social Security Administration: May apply for disability if have a Stage IV cancer. 907-837-9701 332-424-7057 ? [employer], Disability and Transit Services: Assists with nutrition, care and transit needs. [place] Support Programs: '@10RELATIVEDAYS'$ @ > Cancer Support Group  2nd Tuesday of the month 1pm-2pm, Journey Room  > Creative Journey  3rd Tuesday of the month 1130am-1pm, Journey Room  > Look Good Feel Better  1st Wednesday of the month 10am-12 noon, Journey Room (Call [place] to register [phone])

## 2016-04-06 NOTE — Progress Notes (Signed)
Please see opdivo infusion for more information

## 2016-04-06 NOTE — Progress Notes (Signed)
Melissa Sullivan Tolerated chemotherapy and zometa today Discharged ambulatory

## 2016-04-06 NOTE — Patient Instructions (Signed)
[place] at [facility] Discharge Instructions  RECOMMENDATIONS MADE BY THE CONSULTANT AND ANY TEST RESULTS WILL BE SENT TO YOUR REFERRING PHYSICIAN. Exam and discussion today with Kirby Crigler, PA. CT scans next month. Zometa infusion today and every 4 weeks. Opdivo infusion today. Pre-treatment labs every 2 weeks. Office visit in 4 weeks.  Call the clinic with any questions or concerns.   Thank you for choosing [place] at [facility] to provide your oncology and hematology care.  To afford each patient quality time with our provider, please arrive at least 15 minutes before your scheduled appointment time.   Beginning January 23rd 2017 lab work for the [facility] will be done in the  Main lab at [facility] on 1st floor. If you have a lab appointment with the [place] please come in thru the  Main Entrance and check in at the main information desk  You need to re-schedule your appointment should you arrive 10 or more minutes late.  We strive to give you quality time with our providers, and arriving late affects you and other patients whose appointments are after yours.  Also, if you no show three or more times for appointments you may be dismissed from the clinic at the providers discretion.     Again, thank you for choosing [facility].  Our hope is that these requests will decrease the amount of time that you wait before being seen by our physicians.       _____________________________________________________________  Should you have questions after your visit to [facility], please contact our office at [phone] between the hours of 8:30 a.m. and 4:30 p.m.  Voicemails left after 4:30 p.m. will not be returned until the following business day.  For prescription refill requests, have your pharmacy contact our office.         Resources For Cancer Patients and their Caregivers ? American Cancer  Society: Can assist with transportation, wigs, general needs, runs Look Good Feel Better.        (610) 121-1663 ? Cancer Care: Provides financial assistance, online support groups, medication/co-pay assistance.  1-800-813-HOPE (316) 248-9796) ? [place] Assists [place] cancer patients and their families through emotional , educational and financial support.  (810) 183-4960 ? [place] DSS Where to apply for food stamps, Medicaid and utility assistance. 845-560-0351 ? RCATS: Transportation to medical appointments. 458-823-6963 ? Social Security Administration: May apply for disability if have a Stage IV cancer. 856-807-5425 442-695-3661 ? [employer], Disability and Transit Services: Assists with nutrition, care and transit needs. [place] Support Programs: '@10RELATIVEDAYS'$ @ > Cancer Support Group  2nd Tuesday of the month 1pm-2pm, Journey Room  > Creative Journey  3rd Tuesday of the month 1130am-1pm, Journey Room  > Look Good Feel Better  1st Wednesday of the month 10am-12 noon, Journey Room (Call [place] to register [phone])

## 2016-04-14 ENCOUNTER — Telehealth: Payer: Self-pay

## 2016-04-14 MED ORDER — OMEPRAZOLE 40 MG PO CPDR: 40.0000 mg | DELAYED_RELEASE_CAPSULE | Freq: Every day | ORAL | Status: DC

## 2016-04-14 NOTE — Telephone Encounter (Signed)
Completed.

## 2016-04-14 NOTE — Telephone Encounter (Signed)
insurance will only pay for one tab daily and the pharmacy was to know if we would change the RX from 20 mg BID to 40 mg Daily. Please advise

## 2016-04-20 ENCOUNTER — Encounter (HOSPITAL_BASED_OUTPATIENT_CLINIC_OR_DEPARTMENT_OTHER): Payer: 59

## 2016-04-20 VITALS — BP 96/50 | HR 83 | Temp 98.1°F | Resp 16 | Wt 89.0 lb

## 2016-04-20 DIAGNOSIS — C7951 Secondary malignant neoplasm of bone: Secondary | ICD-10-CM

## 2016-04-20 DIAGNOSIS — C3411 Malignant neoplasm of upper lobe, right bronchus or lung: Secondary | ICD-10-CM | POA: Diagnosis not present

## 2016-04-20 DIAGNOSIS — E278 Other specified disorders of adrenal gland: Secondary | ICD-10-CM

## 2016-04-20 DIAGNOSIS — Z5112 Encounter for antineoplastic immunotherapy: Secondary | ICD-10-CM | POA: Diagnosis not present

## 2016-04-20 LAB — COMPREHENSIVE METABOLIC PANEL
ALT: 10 U/L — ABNORMAL LOW (ref 14–54)
ANION GAP: 8 (ref 5–15)
AST: 16 U/L (ref 15–41)
Albumin: 4.2 g/dL (ref 3.5–5.0)
Alkaline Phosphatase: 64 U/L (ref 38–126)
BUN: 19 mg/dL (ref 6–20)
CHLORIDE: 99 mmol/L — AB (ref 101–111)
CO2: 27 mmol/L (ref 22–32)
Calcium: 10.1 mg/dL (ref 8.9–10.3)
Creatinine, Ser: 1.29 mg/dL — ABNORMAL HIGH (ref 0.44–1.00)
GFR calc non Af Amer: 44 mL/min — ABNORMAL LOW (ref >60)
GFR, EST AFRICAN AMERICAN: 51 mL/min — AB (ref >60)
Glucose, Bld: 111 mg/dL — ABNORMAL HIGH (ref 65–99)
Potassium: 3.4 mmol/L — ABNORMAL LOW (ref 3.5–5.1)
SODIUM: 134 mmol/L — AB (ref 135–145)
Total Bilirubin: 0.4 mg/dL (ref 0.3–1.2)
Total Protein: 7.9 g/dL (ref 6.5–8.1)

## 2016-04-20 LAB — CBC WITH DIFFERENTIAL/PLATELET
BASOS ABS: 0 10*3/uL (ref 0.0–0.1)
BASOS PCT: 0 %
EOS ABS: 0.1 10*3/uL (ref 0.0–0.7)
Eosinophils Relative: 2 %
HCT: 36.4 % (ref 36.0–46.0)
Hemoglobin: 12 g/dL (ref 12.0–15.0)
Lymphocytes Relative: 26 %
Lymphs Abs: 2.1 10*3/uL (ref 0.7–4.0)
MCH: 30.8 pg (ref 26.0–34.0)
MCHC: 33 g/dL (ref 30.0–36.0)
MCV: 93.3 fL (ref 78.0–100.0)
MONOS PCT: 7 %
Monocytes Absolute: 0.6 10*3/uL (ref 0.1–1.0)
NEUTROS ABS: 5.3 10*3/uL (ref 1.7–7.7)
NEUTROS PCT: 65 %
PLATELETS: 262 10*3/uL (ref 150–400)
RBC: 3.9 MIL/uL (ref 3.87–5.11)
RDW: 14.3 % (ref 11.5–15.5)
WBC: 8.1 10*3/uL (ref 4.0–10.5)

## 2016-04-20 MED ORDER — SODIUM CHLORIDE 0.9 % IV SOLN
Freq: Once | INTRAVENOUS | Status: AC
  Administered 2016-04-20: 12:00:00 via INTRAVENOUS

## 2016-04-20 MED ORDER — SODIUM CHLORIDE 0.9 % IJ SOLN: 10.0000 mL | INTRAMUSCULAR | Status: DC | PRN

## 2016-04-20 MED ORDER — SODIUM CHLORIDE 0.9 % IV SOLN
240.0000 mg | Freq: Once | INTRAVENOUS | Status: AC
  Administered 2016-04-20: 240 mg via INTRAVENOUS
  Filled 2016-04-20: qty 20

## 2016-04-20 MED ORDER — POTASSIUM CHLORIDE CRYS ER 20 MEQ PO TBCR
40.0000 meq | EXTENDED_RELEASE_TABLET | Freq: Once | ORAL | Status: AC
Start: 2016-04-20 — End: 2016-04-20
  Administered 2016-04-20: 40 meq via ORAL
  Filled 2016-04-20: qty 2

## 2016-04-20 MED ORDER — HEPARIN SOD (PORK) LOCK FLUSH 100 UNIT/ML IV SOLN
500.0000 [IU] | Freq: Once | INTRAVENOUS | Status: AC | PRN
  Administered 2016-04-20: 500 [IU]

## 2016-04-20 NOTE — Progress Notes (Signed)
Labs addressed with Robynn Pane, PA.  Fine to proceed with treatment, and order for PO potassium 73mq received.   Patient tolerated infusion well. VSS.

## 2016-04-20 NOTE — Patient Instructions (Signed)
[facility] Discharge Instructions for Patients Receiving Chemotherapy   Beginning January 23rd 2017 lab work for the [facility] will be done in the  Main lab at [facility] on 1st floor. If you have a lab appointment with the [facility] please come in thru the  Main Entrance and check in at the main information desk   Today you received the following chemotherapy agent: Opdivo.     If you develop nausea and vomiting, or diarrhea that is not controlled by your medication, call the clinic.  The clinic phone number is [phone]. Office hours are Monday-Friday 8:30am-5:00pm.  BELOW ARE SYMPTOMS THAT SHOULD BE REPORTED IMMEDIATELY:  *FEVER GREATER THAN 101.0 F  *CHILLS WITH OR WITHOUT FEVER  NAUSEA AND VOMITING THAT IS NOT CONTROLLED WITH YOUR NAUSEA MEDICATION  *UNUSUAL SHORTNESS OF BREATH  *UNUSUAL BRUISING OR BLEEDING  TENDERNESS IN MOUTH AND THROAT WITH OR WITHOUT PRESENCE OF ULCERS  *URINARY PROBLEMS  *BOWEL PROBLEMS  UNUSUAL RASH Items with * indicate a potential emergency and should be followed up as soon as possible. If you have an emergency after office hours please contact your primary care physician or go to the nearest emergency department.  Please call the clinic during office hours if you have any questions or concerns.   You may also contact the Patient Navigator at [phone] should you have any questions or need assistance in obtaining follow up care.      Resources For Cancer Patients and their Caregivers ? American Cancer Society: Can assist with transportation, wigs, general needs, runs Look Good Feel Better.        (513)218-0310 ? Cancer Care: Provides financial assistance, online support groups, medication/co-pay assistance.  1-800-813-HOPE (520)454-7710) ? [place] Assists [place] cancer patients and their families through emotional , educational and financial support.   [phone] ? [place] DSS Where to apply for food stamps, Medicaid and utility assistance. 618-446-8030 ? RCATS: Transportation to medical appointments. (782)824-9656 ? Social Security Administration: May apply for disability if have a Stage IV cancer. 2495907492 (309)613-7832 ? [employer], Disability and Transit Services: Assists with nutrition, care and transit needs. [phone]

## 2016-05-04 ENCOUNTER — Other Ambulatory Visit (HOSPITAL_COMMUNITY): Payer: Self-pay | Admitting: Oncology

## 2016-05-04 ENCOUNTER — Encounter (HOSPITAL_COMMUNITY): Payer: 59 | Attending: Hematology and Oncology

## 2016-05-04 ENCOUNTER — Encounter (HOSPITAL_COMMUNITY): Payer: 59

## 2016-05-04 ENCOUNTER — Encounter (HOSPITAL_COMMUNITY): Payer: Self-pay | Admitting: Hematology & Oncology

## 2016-05-04 ENCOUNTER — Encounter (HOSPITAL_BASED_OUTPATIENT_CLINIC_OR_DEPARTMENT_OTHER): Payer: 59 | Admitting: Hematology & Oncology

## 2016-05-04 VITALS — BP 109/65 | HR 75 | Temp 97.9°F | Resp 18 | Wt 87.0 lb

## 2016-05-04 VITALS — BP 105/62 | HR 78 | Temp 98.1°F | Resp 16

## 2016-05-04 DIAGNOSIS — E278 Other specified disorders of adrenal gland: Secondary | ICD-10-CM

## 2016-05-04 DIAGNOSIS — Z5112 Encounter for antineoplastic immunotherapy: Secondary | ICD-10-CM

## 2016-05-04 DIAGNOSIS — K219 Gastro-esophageal reflux disease without esophagitis: Secondary | ICD-10-CM | POA: Diagnosis not present

## 2016-05-04 DIAGNOSIS — Z720 Tobacco use: Secondary | ICD-10-CM

## 2016-05-04 DIAGNOSIS — Z9089 Acquired absence of other organs: Secondary | ICD-10-CM | POA: Diagnosis not present

## 2016-05-04 DIAGNOSIS — C7971 Secondary malignant neoplasm of right adrenal gland: Secondary | ICD-10-CM

## 2016-05-04 DIAGNOSIS — Z79899 Other long term (current) drug therapy: Secondary | ICD-10-CM | POA: Insufficient documentation

## 2016-05-04 DIAGNOSIS — C7951 Secondary malignant neoplasm of bone: Secondary | ICD-10-CM | POA: Diagnosis not present

## 2016-05-04 DIAGNOSIS — F17210 Nicotine dependence, cigarettes, uncomplicated: Secondary | ICD-10-CM | POA: Insufficient documentation

## 2016-05-04 DIAGNOSIS — C772 Secondary and unspecified malignant neoplasm of intra-abdominal lymph nodes: Secondary | ICD-10-CM | POA: Insufficient documentation

## 2016-05-04 DIAGNOSIS — C781 Secondary malignant neoplasm of mediastinum: Secondary | ICD-10-CM | POA: Diagnosis not present

## 2016-05-04 DIAGNOSIS — C7972 Secondary malignant neoplasm of left adrenal gland: Secondary | ICD-10-CM | POA: Diagnosis not present

## 2016-05-04 DIAGNOSIS — J449 Chronic obstructive pulmonary disease, unspecified: Secondary | ICD-10-CM | POA: Insufficient documentation

## 2016-05-04 DIAGNOSIS — D630 Anemia in neoplastic disease: Secondary | ICD-10-CM

## 2016-05-04 DIAGNOSIS — I10 Essential (primary) hypertension: Secondary | ICD-10-CM | POA: Diagnosis not present

## 2016-05-04 DIAGNOSIS — E785 Hyperlipidemia, unspecified: Secondary | ICD-10-CM | POA: Diagnosis not present

## 2016-05-04 DIAGNOSIS — C3411 Malignant neoplasm of upper lobe, right bronchus or lung: Secondary | ICD-10-CM | POA: Diagnosis present

## 2016-05-04 DIAGNOSIS — C7989 Secondary malignant neoplasm of other specified sites: Secondary | ICD-10-CM | POA: Diagnosis not present

## 2016-05-04 LAB — COMPREHENSIVE METABOLIC PANEL
ALBUMIN: 4.1 g/dL (ref 3.5–5.0)
ALT: 8 U/L — AB (ref 14–54)
AST: 15 U/L (ref 15–41)
Alkaline Phosphatase: 52 U/L (ref 38–126)
Anion gap: 4 — ABNORMAL LOW (ref 5–15)
BILIRUBIN TOTAL: 0.4 mg/dL (ref 0.3–1.2)
BUN: 12 mg/dL (ref 6–20)
CHLORIDE: 103 mmol/L (ref 101–111)
CO2: 25 mmol/L (ref 22–32)
Calcium: 9.5 mg/dL (ref 8.9–10.3)
Creatinine, Ser: 1.25 mg/dL — ABNORMAL HIGH (ref 0.44–1.00)
GFR calc Af Amer: 53 mL/min — ABNORMAL LOW (ref >60)
GFR calc non Af Amer: 46 mL/min — ABNORMAL LOW (ref >60)
GLUCOSE: 77 mg/dL (ref 65–99)
POTASSIUM: 3.6 mmol/L (ref 3.5–5.1)
Sodium: 132 mmol/L — ABNORMAL LOW (ref 135–145)
TOTAL PROTEIN: 7.5 g/dL (ref 6.5–8.1)

## 2016-05-04 LAB — CBC WITH DIFFERENTIAL/PLATELET
BASOS ABS: 0 10*3/uL (ref 0.0–0.1)
BASOS PCT: 0 %
Eosinophils Absolute: 0.1 10*3/uL (ref 0.0–0.7)
Eosinophils Relative: 2 %
HEMATOCRIT: 35.9 % — AB (ref 36.0–46.0)
HEMOGLOBIN: 11.9 g/dL — AB (ref 12.0–15.0)
Lymphocytes Relative: 30 %
Lymphs Abs: 2.2 10*3/uL (ref 0.7–4.0)
MCH: 30.5 pg (ref 26.0–34.0)
MCHC: 33.1 g/dL (ref 30.0–36.0)
MCV: 92.1 fL (ref 78.0–100.0)
MONO ABS: 0.6 10*3/uL (ref 0.1–1.0)
Monocytes Relative: 8 %
NEUTROS ABS: 4.5 10*3/uL (ref 1.7–7.7)
NEUTROS PCT: 60 %
Platelets: 269 10*3/uL (ref 150–400)
RBC: 3.9 MIL/uL (ref 3.87–5.11)
RDW: 14.5 % (ref 11.5–15.5)
WBC: 7.5 10*3/uL (ref 4.0–10.5)

## 2016-05-04 LAB — TSH: TSH: 1.504 u[IU]/mL (ref 0.350–4.500)

## 2016-05-04 MED ORDER — NIVOLUMAB CHEMO INJECTION 100 MG/10ML
240.0000 mg | Freq: Once | INTRAVENOUS | Status: AC
  Administered 2016-05-04: 240 mg via INTRAVENOUS
  Filled 2016-05-04: qty 20

## 2016-05-04 MED ORDER — SODIUM CHLORIDE 0.9 % IJ SOLN
10.0000 mL | INTRAMUSCULAR | Status: DC | PRN
  Administered 2016-05-04: 10 mL
  Filled 2016-05-04: qty 10

## 2016-05-04 MED ORDER — METFORMIN HCL 500 MG PO TABS
500.0000 mg | ORAL_TABLET | Freq: Two times a day (BID) | ORAL | Status: DC
Start: 2016-05-04 — End: 2016-09-21

## 2016-05-04 MED ORDER — POTASSIUM CHLORIDE CRYS ER 20 MEQ PO TBCR: 20.0000 meq | EXTENDED_RELEASE_TABLET | Freq: Two times a day (BID) | ORAL | Status: DC

## 2016-05-04 MED ORDER — ZOLEDRONIC ACID 4 MG/5ML IV CONC
3.3000 mg | Freq: Once | INTRAVENOUS | Status: AC
  Administered 2016-05-04: 3.3 mg via INTRAVENOUS
  Filled 2016-05-04: qty 4.13

## 2016-05-04 MED ORDER — SODIUM CHLORIDE 0.9 % IV SOLN
Freq: Once | INTRAVENOUS | Status: AC
  Administered 2016-05-04: 12:00:00 via INTRAVENOUS

## 2016-05-04 MED ORDER — HEPARIN SOD (PORK) LOCK FLUSH 100 UNIT/ML IV SOLN
500.0000 [IU] | Freq: Once | INTRAVENOUS | Status: AC | PRN
Start: 2016-05-04 — End: 2016-05-04
  Administered 2016-05-04: 500 [IU]

## 2016-05-04 NOTE — Progress Notes (Signed)
See chemo encounter.

## 2016-05-04 NOTE — Patient Instructions (Signed)
[place] at [facility] Discharge Instructions  RECOMMENDATIONS MADE BY THE CONSULTANT AND ANY TEST RESULTS WILL BE SENT TO YOUR REFERRING PHYSICIAN.  Return to clinic in 1 week post CT scans to review with Dr. Whitney Muse   (CT scheduled for 6/22)  Continue Opdivo  Thank you for choosing [place] at [facility] to provide your oncology and hematology care.  To afford each patient quality time with our provider, please arrive at least 15 minutes before your scheduled appointment time.   Beginning January 23rd 2017 lab work for the [facility] will be done in the  Main lab at [facility] on 1st floor. If you have a lab appointment with the [facility] please come in thru the  Main Entrance and check in at the main information desk  You need to re-schedule your appointment should you arrive 10 or more minutes late.  We strive to give you quality time with our providers, and arriving late affects you and other patients whose appointments are after yours.  Also, if you no show three or more times for appointments you may be dismissed from the clinic at the providers discretion.     Again, thank you for choosing [facility].  Our hope is that these requests will decrease the amount of time that you wait before being seen by our physicians.       _____________________________________________________________  Should you have questions after your visit to [facility], please contact our office at [phone] between the hours of 8:30 a.m. and 4:30 p.m.  Voicemails left after 4:30 p.m. will not be returned until the following business day.  For prescription refill requests, have your pharmacy contact our office.         Resources For Cancer Patients and their Caregivers ? American Cancer Society: Can assist with transportation, wigs, general needs, runs Look Good Feel Better.        ([phone] ? Cancer  Care: Provides financial assistance, online support groups, medication/co-pay assistance.  1-800-813-HOPE 610 030 9583) ? [place] Assists [place] cancer patients and their families through emotional , educational and financial support.  331-583-3558 ? [place] DSS Where to apply for food stamps, Medicaid and utility assistance. 5106790834 ? RCATS: Transportation to medical appointments. (224)353-9775 ? Social Security Administration: May apply for disability if have a Stage IV cancer. [phone] ? [employer], Disability and Transit Services: Assists with nutrition, care and transit needs. [place] Support Programs: '@10RELATIVEDAYS'$ @ > Cancer Support Group  2nd Tuesday of the month 1pm-2pm, Journey Room  > Creative Journey  3rd Tuesday of the month 1130am-1pm, Journey Room  > Look Good Feel Better  1st Wednesday of the month 10am-12 noon, Journey Room (Call [place] to register [phone])

## 2016-05-04 NOTE — Progress Notes (Signed)
[name], stage IV, bilateral adrenal metastases, retroperitoneal lymph nodes  Biopsy on 08/01/2014 c/w poorly differentiated adenocarcinoma  XRT completed on 10/27/2014  Right Brachial plexus neuralgia  EGFR mutation negative ALK mutation negative    Cancer of upper lobe of right lung ([place])   07/28/2014 Imaging CT chest: Large R apical mass consistent with malignancy. This is destroying the R 2nd rib with extension into adjacent soft tissue. R hilar adenopathy with R 5cm adrenal metastatic lesion.   08/01/2014 Initial Biopsy Lung, needle/core biopsy(ies), right upper lobe - POORLY DIFFERENTIATED ADENOCARCINOMA, SEE COMMENT.   08/08/2014 PET scan Large hypermetabolic R apical mass with evidence of direct chest wall and mediastinal invasion, right retrocrural lymphadenopathy, extensive retroperitoneal lymphadenopathy, and metastatic lesions to the adrenal glands    09/02/2014 - 11/04/2014 Chemotherapy Cisplatin/Pemetrexed/Avastin every 21 days x 4 cycles   10/07/2014 - 10/27/2014 Radiation Therapy Right lung apex for control of brachioplexopathy.   12/24/2014 - 02/25/2015 Chemotherapy Alimta/Avastin every 21 days.   02/20/2015 Imaging Increase in size of right adrenal metastasis and subjacent confluent retrocaval lymphadenopathy   02/25/2015 -  Chemotherapy Nivolumab, zometa   05/04/2015 Imaging CT CAP- Stable to slight decrease in the posterior right apical lesion. Stable appearance of posterior right upper rib an upper thoracic bony lesions. Slight improvement in right upper lobe tree-in-bud opacity. No new or progressive findings in...   07/28/2015 Imaging CT CAP- Reduced size of the right apical pleural parenchymal lesion and reduced size of the right adrenal metastatic lesion. Resolution of prior retrocrural adenopathy.  Right eccentric T1 and T2 sclerosis with sclerosis and tapering of the right second..   11/17/2015 Imaging CT CAP- Stable soft tissue thickening in the apex of the right hemi  thorax. Stable right adrenal metastasis. Nodularity along the trachea and mainstem bronchi, relatively new from 07/28/2015, favoring adherent debris.   11/18/2015 Treatment Plan Change Zometa HELD for upcoming tooth extraction   11/24/2015 Treatment Plan Change Zometa on hold at this time in preparation for tooth extraction in March 2017.  Zometa las given on 11/18/2015.   02/03/2016 Imaging CT CAP- Heterogeneous right apical masslike consolidation and right adrenal metastasis are unchanged    Treatment Plan Change Zometa restarted 6 weeks out from tooth extraction (04/06/2016)     CURRENT THERAPY: Nivolumab/zometa/Aranesp  INTERVAL HISTORY: Melissa Sullivan 60 y.o. female returns for follow-up of stage IV adenocarcinoma of the lung. She is doing well.  She sleeps ok. She denies any pain. Her mood is good. She continues on single agent nivolumab.   Melissa Sullivan is accompanied by several family members. She presents in treatment chair. She is here for Cycle #17 Nivolumab.  She denies any new cough. She has no major complaints at this time. Her gums are still tender which make it hard for her to eat certain foods. She plans to get dentures. Her appetite is the same, mostly good and she continues to drink Ensure. Her taste has not improved or worsened.   She denies new pain.   MEDICAL HISTORY: Past Medical History  Diagnosis Date  . Hypertension   . Reflux   . Hyperlipidemia   . Lung mass 07/28/2014  . Adrenal mass, right ([place]) 07/28/2014  . Diabetes mellitus without complication ([place])   . Cancer ([place])     lung  right  . Bone metastases ([place]) 04/05/2016    has Adrenal mass, right ([place]); Cancer of upper lobe of right lung ([place]); ARF (acute renal failure) ([place]); Abnormal finding  on imaging; Oropharyngeal dysphagia; Nausea and vomiting; Hypercalcemia; Anemia in neoplastic disease; Dysphagia; Encounter for screening colonoscopy; and Bone metastases ([place]) on her problem list.      has No Known  Allergies.  Melissa Sullivan does not currently have medications on file.  SURGICAL HISTORY: Past Surgical History  Procedure Laterality Date  . Appendectomy    . Lung biopsy Right 07/2014    CT guided  . Portacath placement Left 09/01/2014  . Esophagogastroduodenoscopy N/A 12/09/2014    XBW:IOMBTD esophageal stricture/mild-to-noderate erosive gastritis. negative H.pylori  . Savory dilation N/A 12/09/2014    Procedure: SAVORY DILATION;  Surgeon: [name], MD;  Location: [facility];  Service: Endoscopy;  Laterality: N/A;  [name] dilation N/A 12/09/2014    Procedure: [name] DILATION;  Surgeon: [name], MD;  Location: [facility];  Service: Endoscopy;  Laterality: N/A;  . Flexible sigmoidoscopy  2011    Dr. [name]: hyperplastic polyp    SOCIAL HISTORY: Social History   Social History  . Marital Status: Married    Spouse Name: N/A  . Number of Children: N/A  . Years of Education: N/A   Occupational History  . Not on file.   Social History Main Topics  . Smoking status: Light Tobacco Smoker -- 0.30 packs/day    Types: Cigarettes  . Smokeless tobacco: Never Used  . Alcohol Use: No  . Drug Use: No  . Sexual Activity: Not on file   Other Topics Concern  . Not on file   Social History Narrative    FAMILY HISTORY: Family History  Problem Relation Age of Onset  . Cancer Sister     Review of Systems  Constitutional: Negative for fatigue. Negative for fever and chills.  HENT: Negative for congestion, hearing loss, nosebleeds, sore throat and tinnitus.   Eyes: Negative for blurred vision, double vision, pain and discharge.  Respiratory: Negative for cough, hemoptysis, sputum production, shortness of breath and wheezing.   Cardiovascular: Negative for chest pain, palpitations, claudication, leg swelling and PND.  Gastrointestinal: . Negative for heartburn, nausea, vomiting, diarrhea, constipation, blood in stool and melena.  Genitourinary: Negative for  dysuria, urgency, frequency and hematuria.  Musculoskeletal: Negative for myalgias, joint pain and falls.  Skin: Negative for itching and rash.  Neurological: Negative for dizziness, tingling, tremors, sensory change, speech change, focal weakness, seizures, loss of consciousness, weakness and headaches.  Endo/Heme/Allergies: Does not bruise/bleed easily.  Psychiatric/Behavioral: Negative for depression, suicidal ideas, memory loss and substance abuse. The patient is not nervous/anxious and does not have insomnia.    14 point review of systems was performed and is negative except as detailed under history of present illness and above   PHYSICAL EXAMINATION  ECOG PERFORMANCE STATUS: 1 - Symptomatic but completely ambulatory  Filed Vitals:   05/04/16 1135  BP: 105/62  Pulse: 78  Temp: 98.1 F (36.7 C)  Resp: 16    Physical Exam  Constitutional: She is oriented to person, place, and time.  HENT:  Head: Normocephalic and atraumatic.  Nose: Nose normal.  Mouth/Throat: Oropharynx is clear and moist. No oropharyngeal exudate.  Eyes: Conjunctivae and EOM are normal. Pupils are equal, round, and reactive to light. Right eye exhibits no discharge. Left eye exhibits no discharge. No scleral icterus.  Neck: Normal range of motion. Neck supple. No tracheal deviation present. No thyromegaly present.  Cardiovascular: Normal rate, regular rhythm and normal heart sounds.  Exam reveals no gallop and no friction rub.   No murmur heard.  Pulmonary/Chest: Effort normal and breath sounds normal. She has no wheezes. She has no rales.  Abdominal: Soft. Bowel sounds are normal. She exhibits no distension and no mass. There is no tenderness. There is no rebound and no guarding.  Thin  Musculoskeletal: Normal range of motion. She exhibits no edema.  Lymphadenopathy:    She has no cervical adenopathy.  Neurological: She is alert and oriented to person, place, and time. She has normal reflexes. No cranial  nerve deficit. Gait normal. Coordination normal.  Skin: Skin is warm and dry. No rash noted.  Psychiatric: Mood, memory, affect and judgment normal.  Nursing note and vitals reviewed.   LABORATORY DATA: I have reviewed the data below as listed. CBC    Component Value Date/Time   WBC 7.5 05/04/2016 1114   RBC 3.90 05/04/2016 1114   RBC 3.60* 02/17/2016 0938   HGB 11.9* 05/04/2016 1114   HCT 35.9* 05/04/2016 1114   PLT 269 05/04/2016 1114   MCV 92.1 05/04/2016 1114   MCH 30.5 05/04/2016 1114   MCHC 33.1 05/04/2016 1114   RDW 14.5 05/04/2016 1114   LYMPHSABS 2.2 05/04/2016 1114   MONOABS 0.6 05/04/2016 1114   EOSABS 0.1 05/04/2016 1114   BASOSABS 0.0 05/04/2016 1114   Results for Melissa Sullivan, Melissa Sullivan (MRN [id number]) as of 05/29/2016 17:17  Ref. Range 05/04/2016 11:14  Sodium Latest Ref Range: 135-145 mmol/L 132 (L)  Potassium Latest Ref Range: 3.5-5.1 mmol/L 3.6  Chloride Latest Ref Range: 101-111 mmol/L 103  CO2 Latest Ref Range: 22-32 mmol/L 25  BUN Latest Ref Range: 6-20 mg/dL 12  Creatinine Latest Ref Range: 0.44-1.00 mg/dL 1.25 (H)  Calcium Latest Ref Range: 8.9-10.3 mg/dL 9.5  EGFR (Non-African Amer.) Latest Ref Range: >60 mL/min 46 (L)  EGFR (African American) Latest Ref Range: >60 mL/min 53 (L)  Glucose Latest Ref Range: 65-99 mg/dL 77  Anion gap Latest Ref Range: 5-15  4 (L)  Alkaline Phosphatase Latest Ref Range: 38-126 U/L 52  Albumin Latest Ref Range: 3.5-5.0 g/dL 4.1  AST Latest Ref Range: 15-41 U/L 15  ALT Latest Ref Range: 14-54 U/L 8 (L)  Total Protein Latest Ref Range: 6.5-8.1 g/dL 7.5  Total Bilirubin Latest Ref Range: 0.3-1.2 mg/dL 0.4    RADIOLOGY: I have personally reviewed the radiological images as listed and agreed with the findings in the report. Study Result     CLINICAL DATA: Stage IV lung cancer, right upper lobe. Adrenal metastases. Chemotherapy and radiation therapy.  EXAM: CT CHEST, ABDOMEN, AND PELVIS WITH  CONTRAST  TECHNIQUE: Multidetector CT imaging of the chest, abdomen and pelvis was performed following the standard protocol during bolus administration of intravenous contrast.  CONTRAST: 134m OMNIPAQUE IOHEXOL 300 MG/ML SOLN  COMPARISON: 11/17/2015.  FINDINGS: CT CHEST FINDINGS  Mediastinum/Lymph Nodes: No pathologically enlarged mediastinal, hilar or axillary lymph nodes. Left IJ Port-A-Cath terminates in the low SVC. Atherosclerotic calcification of the arterial vasculature, including coronary arteries. Heart size normal. No pericardial effusion.  Lungs/Pleura: Heterogeneous masslike consolidation in the apical segment right upper lobe is unchanged. Moderate centrilobular emphysema. Peribronchovascular nodularity in the posterior segment right upper lobe (6/16), new and likely infectious or post infectious in etiology. Scattered mucoid impaction. No pleural fluid. Previously seen nodularity along the trachea and mainstem bronchi has largely resolved. Likely dependent debris within the distal left mainstem bronchus.  Musculoskeletal: No worrisome lytic or sclerotic lesions.  CT ABDOMEN PELVIS FINDINGS  Hepatobiliary: Liver and gallbladder are unremarkable. No biliary ductal dilatation.  Pancreas: Negative.  Spleen:  Negative.  Adrenals/Urinary Tract: Right adrenal mass measures 2.4 x 4.0 cm, stable. Left adrenal glands and kidneys are unremarkable. Ureters are decompressed. Bladder is low in volume.  Stomach/Bowel: Stomach, small bowel and colon are unremarkable. Appendectomy.  Vascular/Lymphatic: Atherosclerotic calcification of the arterial vasculature without abdominal aortic aneurysm. No pathologically enlarged lymph nodes.  Reproductive: Uterus and ovaries are visualized.  Other: No free fluid. Mesenteries and peritoneum are unremarkable.  Musculoskeletal: No worrisome lytic or sclerotic lesions.  IMPRESSION: 1. Heterogeneous  right apical masslike consolidation and right adrenal metastasis are unchanged. 2. Previously seen nodularity along the trachea and mainstem bronchi has largely resolved. Dependent debris is seen in the distal left mainstem bronchus.   Electronically Signed  By: Lorin Picket M.D.  On: 02/03/2016 14:54     ASSESSMENT and THERAPY PLAN:   Stage IV adenocarcinoma of the lung, EGFR and ALK negative  She is certainly doing well clinically. CT scans have show stable disease. Next scans are scheduled for 6/22.   We will continue with current therapy. She has absolutely no major complaints today. Again reviewed the potential side effects of her current treatment. She will proceed with Nivolumab today.   Hypercalcemia/bony metastatic disease  Zometa was resumed on 5/17 and is dose adjusted for her GFR. Continue to monitor.  Tobacco ABUSE  I re-emphasized the benefits of smoking cessation. I have offered her methods to quit in the past. She has cut back significantly.  I have refilled her potassium and metformin today.  All questions were answered. The patient knows to call the clinic with any problems, questions or concerns. We can certainly see the patient much sooner if necessary.   This document serves as a record of services personally performed by Ancil Linsey, MD. It was created on her behalf by Arlyce Harman, a trained medical scribe. The creation of this record is based on the scribe's personal observations and the provider's statements to them. This document has been checked and approved by the attending provider.  I have reviewed the above documentation for accuracy and completeness, and I agree with the above.  This note was signed electronically  Larene Beach K. Whitney Muse, MD

## 2016-05-04 NOTE — Progress Notes (Signed)
1400:  Tolerated infusion w/o adverse reaction.  A&Ox4, in no distress.  VSS.  Discharged ambulatory in c/o family.

## 2016-05-12 ENCOUNTER — Ambulatory Visit (HOSPITAL_COMMUNITY)
Admission: RE | Admit: 2016-05-12 | Discharge: 2016-05-12 | Disposition: A | Discharge status: Home / Self Care | Payer: 59 | Source: Ambulatory Visit | Attending: Oncology | Admitting: Oncology

## 2016-05-12 DIAGNOSIS — I700 Atherosclerosis of aorta: Secondary | ICD-10-CM | POA: Diagnosis not present

## 2016-05-12 DIAGNOSIS — C7951 Secondary malignant neoplasm of bone: Secondary | ICD-10-CM

## 2016-05-12 DIAGNOSIS — J439 Emphysema, unspecified: Secondary | ICD-10-CM | POA: Insufficient documentation

## 2016-05-12 DIAGNOSIS — C3411 Malignant neoplasm of upper lobe, right bronchus or lung: Secondary | ICD-10-CM | POA: Diagnosis present

## 2016-05-12 DIAGNOSIS — J8410 Pulmonary fibrosis, unspecified: Secondary | ICD-10-CM | POA: Insufficient documentation

## 2016-05-12 MED ORDER — IOPAMIDOL (ISOVUE-300) INJECTION 61%
100.0000 mL | Freq: Once | INTRAVENOUS | Status: AC | PRN
  Administered 2016-05-12: 100 mL via INTRAVENOUS

## 2016-05-18 ENCOUNTER — Encounter (HOSPITAL_COMMUNITY): Payer: Self-pay

## 2016-05-18 ENCOUNTER — Encounter (HOSPITAL_BASED_OUTPATIENT_CLINIC_OR_DEPARTMENT_OTHER): Payer: 59 | Admitting: Oncology

## 2016-05-18 ENCOUNTER — Inpatient Hospital Stay (HOSPITAL_COMMUNITY): Payer: 59

## 2016-05-18 ENCOUNTER — Encounter (HOSPITAL_BASED_OUTPATIENT_CLINIC_OR_DEPARTMENT_OTHER): Payer: 59

## 2016-05-18 VITALS — BP 103/60 | HR 75 | Temp 98.2°F | Resp 16 | Wt 91.0 lb

## 2016-05-18 DIAGNOSIS — Z5112 Encounter for antineoplastic immunotherapy: Secondary | ICD-10-CM | POA: Diagnosis not present

## 2016-05-18 DIAGNOSIS — C7951 Secondary malignant neoplasm of bone: Secondary | ICD-10-CM

## 2016-05-18 DIAGNOSIS — C3411 Malignant neoplasm of upper lobe, right bronchus or lung: Secondary | ICD-10-CM

## 2016-05-18 DIAGNOSIS — E278 Other specified disorders of adrenal gland: Secondary | ICD-10-CM

## 2016-05-18 LAB — COMPREHENSIVE METABOLIC PANEL
ALK PHOS: 60 U/L (ref 38–126)
ALT: 12 U/L — ABNORMAL LOW (ref 14–54)
ANION GAP: 5 (ref 5–15)
AST: 17 U/L (ref 15–41)
Albumin: 4.1 g/dL (ref 3.5–5.0)
BILIRUBIN TOTAL: 0.3 mg/dL (ref 0.3–1.2)
BUN: 17 mg/dL (ref 6–20)
CALCIUM: 10.3 mg/dL (ref 8.9–10.3)
CO2: 29 mmol/L (ref 22–32)
Chloride: 102 mmol/L (ref 101–111)
Creatinine, Ser: 1.23 mg/dL — ABNORMAL HIGH (ref 0.44–1.00)
GFR calc non Af Amer: 47 mL/min — ABNORMAL LOW (ref >60)
GFR, EST AFRICAN AMERICAN: 55 mL/min — AB (ref >60)
GLUCOSE: 122 mg/dL — AB (ref 65–99)
Potassium: 3.9 mmol/L (ref 3.5–5.1)
Sodium: 136 mmol/L (ref 135–145)
TOTAL PROTEIN: 7.6 g/dL (ref 6.5–8.1)

## 2016-05-18 LAB — CBC WITH DIFFERENTIAL/PLATELET
BASOS PCT: 0 %
Basophils Absolute: 0 10*3/uL (ref 0.0–0.1)
EOS ABS: 0.3 10*3/uL (ref 0.0–0.7)
Eosinophils Relative: 3 %
HCT: 34.5 % — ABNORMAL LOW (ref 36.0–46.0)
HEMOGLOBIN: 11.5 g/dL — AB (ref 12.0–15.0)
Lymphocytes Relative: 23 %
Lymphs Abs: 2 10*3/uL (ref 0.7–4.0)
MCH: 30.9 pg (ref 26.0–34.0)
MCHC: 33.3 g/dL (ref 30.0–36.0)
MCV: 92.7 fL (ref 78.0–100.0)
Monocytes Absolute: 0.7 10*3/uL (ref 0.1–1.0)
Monocytes Relative: 9 %
NEUTROS ABS: 5.6 10*3/uL (ref 1.7–7.7)
NEUTROS PCT: 65 %
Platelets: 300 10*3/uL (ref 150–400)
RBC: 3.72 MIL/uL — AB (ref 3.87–5.11)
RDW: 15 % (ref 11.5–15.5)
WBC: 8.6 10*3/uL (ref 4.0–10.5)

## 2016-05-18 LAB — TSH: TSH: 2.032 u[IU]/mL (ref 0.350–4.500)

## 2016-05-18 MED ORDER — SODIUM CHLORIDE 0.9 % IV SOLN
240.0000 mg | Freq: Once | INTRAVENOUS | Status: AC
  Administered 2016-05-18: 240 mg via INTRAVENOUS
  Filled 2016-05-18: qty 20

## 2016-05-18 MED ORDER — HEPARIN SOD (PORK) LOCK FLUSH 100 UNIT/ML IV SOLN
500.0000 [IU] | Freq: Once | INTRAVENOUS | Status: AC
  Administered 2016-05-18: 500 [IU] via INTRAVENOUS

## 2016-05-18 MED ORDER — HEPARIN SOD (PORK) LOCK FLUSH 100 UNIT/ML IV SOLN
INTRAVENOUS | Status: AC
Start: 2016-05-18 — End: 2016-05-18
  Filled 2016-05-18: qty 5

## 2016-05-18 MED ORDER — SODIUM CHLORIDE 0.9 % IV SOLN
Freq: Once | INTRAVENOUS | Status: AC
  Administered 2016-05-18: 12:00:00 via INTRAVENOUS

## 2016-05-18 NOTE — Patient Instructions (Signed)
[facility] Discharge Instructions for Patients Receiving Chemotherapy   Beginning January 23rd 2017 lab work for the Cancer Center will be done in the  Main lab at [place] on 1st floor. If you have a lab appointment with the Cancer Center please come in thru the  Main Entrance and check in at the main information desk   Today you received the following chemotherapy agents:  Opdivo  If you develop nausea and vomiting, or diarrhea that is not controlled by your medication, call the clinic.  The clinic phone number is [phone]. Office hours are Monday-Friday 8:30am-5:00pm.  BELOW ARE SYMPTOMS THAT SHOULD BE REPORTED IMMEDIATELY:  *FEVER GREATER THAN 101.0 F  *CHILLS WITH OR WITHOUT FEVER  NAUSEA AND VOMITING THAT IS NOT CONTROLLED WITH YOUR NAUSEA MEDICATION  *UNUSUAL SHORTNESS OF BREATH  *UNUSUAL BRUISING OR BLEEDING  TENDERNESS IN MOUTH AND THROAT WITH OR WITHOUT PRESENCE OF ULCERS  *URINARY PROBLEMS  *BOWEL PROBLEMS  UNUSUAL RASH Items with * indicate a potential emergency and should be followed up as soon as possible. If you have an emergency after office hours please contact your primary care physician or go to the nearest emergency department.  Please call the clinic during office hours if you have any questions or concerns.   You may also contact the Patient Navigator at [phone] should you have any questions or need assistance in obtaining follow up care.      Resources For Cancer Patients and their Caregivers ? American Cancer Society: Can assist with transportation, wigs, general needs, runs Look Good Feel Better.        1-888-227-6333 ? Cancer Care: Provides financial assistance, online support groups, medication/co-pay assistance.  1-800-813-HOPE (4673) ? [facility] Assists [place] cancer patients and their families through emotional , educational and financial support.   336-427-4357 ? [place] DSS Where to apply for food stamps, Medicaid and utility assistance. 336-342-1394 ? [facility]: Transportation to medical appointments. 336-347-2287 ? Social Security Administration: May apply for disability if have a Stage IV cancer. 336-342-7796 1-800-772-1213 ? [place] [facility]ging, Disability and Transit Services: Assists with nutrition, care and transit needs. 336-349-2343         

## 2016-05-18 NOTE — Assessment & Plan Note (Signed)
Bone metastases on original staging PET scan.    Zometa every 4 weeks.  Due next in 2 weeks.

## 2016-05-18 NOTE — Progress Notes (Signed)
Melissa Neighbors, MD [address] [place] [address]  Cancer of upper lobe of right lung [facility])  Bone metastases ([place])  CURRENT THERAPY: Nivolumab every 2 weeks.Zometa every 4 weeks is restarted today, 04/06/2016, after being held for tooth extraction.  INTERVAL HISTORY: Melissa Sullivan 60 y.o. 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     Cancer of upper lobe of right lung (Melissa Sullivan)   07/28/2014 Imaging CT chest: Large R apical mass consistent with malignancy. This is destroying the R 2nd rib with extension into adjacent soft tissue. R hilar adenopathy with R 5cm adrenal metastatic lesion.   08/01/2014 Initial Biopsy Lung, needle/core biopsy(ies), right upper lobe - POORLY DIFFERENTIATED ADENOCARCINOMA, SEE COMMENT.   08/08/2014 PET scan Large hypermetabolic R apical mass with evidence of direct chest wall and mediastinal invasion, right retrocrural lymphadenopathy, extensive retroperitoneal lymphadenopathy, and metastatic lesions to the adrenal glands    09/02/2014 - 11/04/2014 Chemotherapy Cisplatin/Pemetrexed/Avastin every 21 days x 4 cycles   10/07/2014 - 10/27/2014 Radiation Therapy Right lung apex for control of brachioplexopathy.   12/24/2014 - 02/25/2015 Chemotherapy Alimta/Avastin every 21 days.   02/20/2015 Imaging Increase in size of right adrenal metastasis and subjacent confluent retrocaval lymphadenopathy   02/25/2015 -  Chemotherapy Nivolumab, zometa   05/04/2015 Imaging CT CAP- Stable to slight decrease in the posterior right apical lesion. Stable appearance of posterior right upper rib an upper thoracic bony lesions. Slight improvement in right upper lobe tree-in-bud opacity. No new or progressive findings in...   07/28/2015 Imaging CT CAP- Reduced size of the right apical pleural parenchymal lesion and reduced size of the right adrenal metastatic lesion.  Resolution of prior retrocrural adenopathy.  Right eccentric T1 and T2 sclerosis with sclerosis and tapering of the right second..   11/17/2015 Imaging CT CAP- Stable soft tissue thickening in the apex of the right hemi thorax. Stable right adrenal metastasis. Nodularity along the trachea and mainstem bronchi, relatively new from 07/28/2015, favoring adherent debris.   11/18/2015 Treatment Plan Change Zometa HELD for upcoming tooth extraction   11/24/2015 Treatment Plan Change Zometa on hold at this time in preparation for tooth extraction in March 2017.  Zometa las given on 11/18/2015.   02/03/2016 Imaging CT CAP- Heterogeneous right apical masslike consolidation and right adrenal metastasis are unchanged   04/06/2016 Treatment Plan Change Zometa restarted 6 weeks out from tooth extraction (04/06/2016)   05/12/2016 Imaging CT CAP- NED in the chest, abdomen or pelvis.  Some areas of nodularity associated with the mainstem bronchi in the left upper lobe bronchus, favored to represent adherent inspissated secretions   She is doing very well.  She denies any complaints.  She is down to smoking 1 cigarette per day.  She continues to work on cessation.  Review of Systems  Constitutional: Negative.  Negative for fever, chills and weight loss.  Eyes: Negative.  Negative for blurred vision and double vision.  Respiratory: Negative.  Negative for cough and hemoptysis.   Cardiovascular: Negative.   Gastrointestinal: Negative.  Negative for nausea, vomiting, abdominal pain, diarrhea and constipation.  Genitourinary: Negative.  Negative for dysuria and urgency.  Musculoskeletal: Negative.  Negative for falls.  Skin: Negative.   Neurological: Negative.  Negative for headaches.  Endo/Heme/Allergies: Negative.   Psychiatric/Behavioral: Negative.     Past Medical History  Diagnosis Date  . Hypertension   . Reflux   .  Hyperlipidemia   . Lung mass 07/28/2014  . Adrenal mass, right (Melissa Sullivan) 07/28/2014  . Diabetes  mellitus without complication (Melissa Sullivan)   . Cancer ([place])     lung  right  . Bone metastases (Melissa Sullivan) 04/05/2016    Past Surgical History  Procedure Laterality Date  . Appendectomy    . Lung biopsy Right 07/2014    CT guided  . Portacath placement Left 09/01/2014  . Esophagogastroduodenoscopy N/A 12/09/2014    FBP:ZWCHEN esophageal stricture/mild-to-noderate erosive gastritis. negative H.pylori  . Savory dilation N/A 12/09/2014    Procedure: SAVORY DILATION;  Surgeon: [name], MD;  [facility];  Service: Endoscopy;  Laterality: N/A;  [name] dilation N/A 12/09/2014    Procedure: [name] DILATION;  Surgeon: [name], MD;  Location[facility];  Service: Endoscopy;  Laterality: N/A;  . Flexible sigmoidoscopy  2011    Dr. Oneida Sullivan: hyperplastic polyp    Family History  Problem Relation Age of Onset  . Cancer Sister     Social History   Social History  . Marital Status: Married    Spouse Name: N/A  . Number of Children: N/A  . Years of Education: N/A   Social History Main Topics  . Smoking status: Light Tobacco Smoker -- 0.30 packs/day    Types: Cigarettes  . Smokeless tobacco: Never Used  . Alcohol Use: No  . Drug Use: No  . Sexual Activity: Not on file   Other Topics Concern  . Not on file   Social History Narrative     PHYSICAL EXAMINATION  ECOG PERFORMANCE STATUS: 1 - Symptomatic but completely ambulatory  There were no vitals filed for this visit.  Blood pressure 124/76 Pulse 100 Respirations 18 Temperature 98.21F Oxygen saturation 100% on room air.  GENERAL:alert, no distress, well nourished, well developed, cachectic, comfortable, cooperative, smiling and accompanied by her mother and sister, in chemo-bed. SKIN: skin color, texture, turgor are normal, no rashes or significant lesions HEAD: Normocephalic, No masses, lesions, tenderness or abnormalities EYES: normal, EOMI, Conjunctiva are pink and non-injected EARS: External ears  normal OROPHARYNX:lips, buccal mucosa, and tongue normal and mucous membranes are moist  NECK: supple, trachea midline LYMPH:  no palpable lymphadenopathy BREAST:not examined LUNGS: clear to auscultation and percussion HEART: regular rate & rhythm, no murmurs, no gallops, S1 normal and S2 normal ABDOMEN:abdomen soft and normal bowel sounds BACK: Back symmetric, no curvature. EXTREMITIES:less then 2 second capillary refill, no joint deformities, effusion, or inflammation, no edema, no skin discoloration, no cyanosis  NEURO: alert & oriented x 3 with fluent speech, no focal motor/sensory deficits, gait normal   LABORATORY DATA: CBC    Component Value Date/Time   WBC 8.6 05/18/2016 1105   RBC 3.72* 05/18/2016 1105   RBC 3.60* 02/17/2016 0938   HGB 11.5* 05/18/2016 1105   HCT 34.5* 05/18/2016 1105   PLT 300 05/18/2016 1105   MCV 92.7 05/18/2016 1105   MCH 30.9 05/18/2016 1105   MCHC 33.3 05/18/2016 1105   RDW 15.0 05/18/2016 1105   LYMPHSABS 2.0 05/18/2016 1105   MONOABS 0.7 05/18/2016 1105   EOSABS 0.3 05/18/2016 1105   BASOSABS 0.0 05/18/2016 1105      Chemistry      Component Value Date/Time   NA 136 05/18/2016 1105   K 3.9 05/18/2016 1105   CL 102 05/18/2016 1105   CO2 29 05/18/2016 1105   BUN 17 05/18/2016 1105   CREATININE 1.23* 05/18/2016 1105      Component  Value Date/Time   CALCIUM 10.3 05/18/2016 1105   CALCIUM 9.7 06/03/2015 1000   ALKPHOS 60 05/18/2016 1105   AST 17 05/18/2016 1105   ALT 12* 05/18/2016 1105   BILITOT 0.3 05/18/2016 1105        PENDING LABS:   RADIOGRAPHIC STUDIES:  Ct Chest W Contrast  05/12/2016  CLINICAL DATA:  60 year old female with history of right upper lobe non-small-cell bronchogenic carcinoma with bone metastasis. Restaging examination. EXAM: CT CHEST, ABDOMEN, AND PELVIS WITH CONTRAST TECHNIQUE: Multidetector CT imaging of the chest, abdomen and pelvis was performed following the standard protocol during bolus  administration of intravenous contrast. CONTRAST:  152m ISOVUE-300 IOPAMIDOL (ISOVUE-300) INJECTION 61% COMPARISON:  CT the chest, abdomen and pelvis 02/03/2016. FINDINGS: CT CHEST FINDINGS Mediastinum/Lymph Nodes: Heart size is normal. Heart size is normal. There is no significant pericardial fluid, thickening or pericardial calcification. Left-sided internal jugular single-lumen porta cath with tip terminating in the distal superior vena cava. No pathologically enlarged mediastinal or hilar lymph nodes. Esophagus is unremarkable in appearance. No axillary lymphadenopathy. Lungs/Pleura: Again noted is a pleural-based area of chronic partially calcified mass like consolidation in the apex of the right upper lobe which is similar to the prior study, most compatible with chronic postradiation mass-like fibrosis. No definite findings to suggest local recurrence of disease. No new suspicious appearing pulmonary nodules or masses are noted. There are few scattered peripheral predominant tiny nodules that are most compatible with areas of chronic mucoid impaction within terminal bronchioles, which are unchanged. Diffuse bronchial wall thickening with moderate centrilobular and paraseptal emphysema. In the mainstem bronchi and bilaterally (left greater than right) and in the proximal left upper lobe bronchus there are several nodular densities associated with the nondependent walls of the bronchi which are suspicious for endobronchial nodules, however, this could alternatively represent adherent secretions. The largest of these is 5 mm on image 51 of series 6. No acute consolidative airspace disease. No pleural effusions. Musculoskeletal/Soft Tissues: Areas of regular sclerosis and lucency in the right-sided the T2 vertebral body appear very similar to the prior study, presumably a treated metastatic lesion. No other aggressive appearing lytic or blastic lesions are noted elsewhere in the visualized axial or appendicular  skeleton. CT ABDOMEN AND PELVIS FINDINGS Hepatobiliary: No cystic or solid hepatic lesions. No intra or extrahepatic biliary ductal dilatation. Gallbladder is normal in appearance. Pancreas: No pancreatic mass. No pancreatic ductal dilatation. No pancreatic or peripancreatic fluid or inflammatory changes. Spleen: Unremarkable. Adrenals/Urinary Tract: Complex soft tissue lesion which appears to extend from the right adrenal gland into the retro aortic region appears similar to the prior study. This lesion is highly irregular in shape and therefore difficult to measure, but when measured in a similar fashion to the prior examination on image 56 of series 2 of today's study measures 3.8 x 2.4 cm (previously 4.0 x 2.4 cm). Left adrenal gland is normal in appearance. Bilateral kidneys are normal in appearance. Stomach/Bowel: Normal appearance of the stomach. No pathologic dilatation of small bowel or colon. Numerous colonic diverticular if that a few scattered colonic diverticulae are noted, without surrounding inflammatory changes to suggest an acute diverticulitis at this time. The appendix is not confidently identified and may be surgically absent. Regardless, there are no inflammatory changes noted adjacent to the cecum to suggest the presence of an acute appendicitis at this time. Vascular/Lymphatic: Aortic atherosclerosis, as well as atherosclerotic disease throughout the remaining abdominal and pelvic vasculature, without evidence of aneurysm or dissection. No lymphadenopathy noted in  the abdomen or pelvis. Reproductive: All uterus and ovaries are atrophic. Other: No significant volume of ascites.  No pneumoperitoneum. Musculoskeletal: There are no aggressive appearing lytic or blastic lesions noted in the visualized portions of the skeleton. IMPRESSION: 1. Chronic postradiation mass-like fibrosis in the apex of the right upper lobe and treated right adrenal metastasis both appear very similar to the prior  examination from 02/03/2016, without evidence of recurrence or definite new metastatic disease elsewhere in the chest, abdomen or pelvis. 2. There are some areas of nodularity associated with the mainstem bronchi in the left upper lobe bronchus, as discussed above. This is favored to represent adherent inspissated secretions, however, given that all of these appear nondependent, the possibility of endobronchial nodules is not excluded. Close attention on followup studies is recommended to ensure the stability or resolution of these findings. Alternatively, these could be further evaluated with nonemergent bronchoscopy if clinically appropriate. 3. Diffuse bronchial wall thickening with moderate centrilobular and paraseptal emphysema; imaging findings suggestive of underlying COPD. 4. Aortic atherosclerosis. 5. Additional incidental findings, as above. Electronically Signed   By: Vinnie Langton M.D.   On: 05/12/2016 14:48   Ct Abdomen Pelvis W Contrast  05/12/2016  CLINICAL DATA:  60 year old female with history of right upper lobe non-small-cell bronchogenic carcinoma with bone metastasis. Restaging examination. EXAM: CT CHEST, ABDOMEN, AND PELVIS WITH CONTRAST TECHNIQUE: Multidetector CT imaging of the chest, abdomen and pelvis was performed following the standard protocol during bolus administration of intravenous contrast. CONTRAST:  157m ISOVUE-300 IOPAMIDOL (ISOVUE-300) INJECTION 61% COMPARISON:  CT the chest, abdomen and pelvis 02/03/2016. FINDINGS: CT CHEST FINDINGS Mediastinum/Lymph Nodes: Heart size is normal. Heart size is normal. There is no significant pericardial fluid, thickening or pericardial calcification. Left-sided internal jugular single-lumen porta cath with tip terminating in the distal superior vena cava. No pathologically enlarged mediastinal or hilar lymph nodes. Esophagus is unremarkable in appearance. No axillary lymphadenopathy. Lungs/Pleura: Again noted is a pleural-based area of  chronic partially calcified mass like consolidation in the apex of the right upper lobe which is similar to the prior study, most compatible with chronic postradiation mass-like fibrosis. No definite findings to suggest local recurrence of disease. No new suspicious appearing pulmonary nodules or masses are noted. There are few scattered peripheral predominant tiny nodules that are most compatible with areas of chronic mucoid impaction within terminal bronchioles, which are unchanged. Diffuse bronchial wall thickening with moderate centrilobular and paraseptal emphysema. In the mainstem bronchi and bilaterally (left greater than right) and in the proximal left upper lobe bronchus there are several nodular densities associated with the nondependent walls of the bronchi which are suspicious for endobronchial nodules, however, this could alternatively represent adherent secretions. The largest of these is 5 mm on image 51 of series 6. No acute consolidative airspace disease. No pleural effusions. Musculoskeletal/Soft Tissues: Areas of regular sclerosis and lucency in the right-sided the T2 vertebral body appear very similar to the prior study, presumably a treated metastatic lesion. No other aggressive appearing lytic or blastic lesions are noted elsewhere in the visualized axial or appendicular skeleton. CT ABDOMEN AND PELVIS FINDINGS Hepatobiliary: No cystic or solid hepatic lesions. No intra or extrahepatic biliary ductal dilatation. Gallbladder is normal in appearance. Pancreas: No pancreatic mass. No pancreatic ductal dilatation. No pancreatic or peripancreatic fluid or inflammatory changes. Spleen: Unremarkable. Adrenals/Urinary Tract: Complex soft tissue lesion which appears to extend from the right adrenal gland into the retro aortic region appears similar to the prior study. This lesion is highly  irregular in shape and therefore difficult to measure, but when measured in a similar fashion to the prior  examination on image 56 of series 2 of today's study measures 3.8 x 2.4 cm (previously 4.0 x 2.4 cm). Left adrenal gland is normal in appearance. Bilateral kidneys are normal in appearance. Stomach/Bowel: Normal appearance of the stomach. No pathologic dilatation of small bowel or colon. Numerous colonic diverticular if that a few scattered colonic diverticulae are noted, without surrounding inflammatory changes to suggest an acute diverticulitis at this time. The appendix is not confidently identified and may be surgically absent. Regardless, there are no inflammatory changes noted adjacent to the cecum to suggest the presence of an acute appendicitis at this time. Vascular/Lymphatic: Aortic atherosclerosis, as well as atherosclerotic disease throughout the remaining abdominal and pelvic vasculature, without evidence of aneurysm or dissection. No lymphadenopathy noted in the abdomen or pelvis. Reproductive: All uterus and ovaries are atrophic. Other: No significant volume of ascites.  No pneumoperitoneum. Musculoskeletal: There are no aggressive appearing lytic or blastic lesions noted in the visualized portions of the skeleton. IMPRESSION: 1. Chronic postradiation mass-like fibrosis in the apex of the right upper lobe and treated right adrenal metastasis both appear very similar to the prior examination from 02/03/2016, without evidence of recurrence or definite new metastatic disease elsewhere in the chest, abdomen or pelvis. 2. There are some areas of nodularity associated with the mainstem bronchi in the left upper lobe bronchus, as discussed above. This is favored to represent adherent inspissated secretions, however, given that all of these appear nondependent, the possibility of endobronchial nodules is not excluded. Close attention on followup studies is recommended to ensure the stability or resolution of these findings. Alternatively, these could be further evaluated with nonemergent bronchoscopy if  clinically appropriate. 3. Diffuse bronchial wall thickening with moderate centrilobular and paraseptal emphysema; imaging findings suggestive of underlying COPD. 4. Aortic atherosclerosis. 5. Additional incidental findings, as above. Electronically Signed   By: Vinnie Langton M.D.   On: 05/12/2016 14:48     PATHOLOGY:    ASSESSMENT AND PLAN:  Cancer of upper lobe of right lung Stage IV right adenocarcinoma pancoast tumor of the lung (EGFR and ALK negative) complicated by right brachial plexus neuralgia.  S/P XRT.  Currently on Nivolumab salvage therapy with Zometa for bone metastasis.  Oncology history is updated.  Pre-treatment labs today.  I personally reviewed and went over laboratory results with the patient.  The results are noted within this dictation. Pre-chemotherapy labs today satisfy treatment criteria.  TSH has been monitored and WNL most recently.  She continues to smoke.  She is down to smoking 1 cigarette daily (compared to 3-5 daily in the past).  Smoking cessation encouraged.  She reports that she will be quitting prior to her follow-up appointment.  ZOMETA is due in 2 weeks.  I personally reviewed and went over radiographic studies with the patient.  The results are noted within this dictation.  CT scans are negative for any progression or new metastatic disease.  Continue with Nivolumab every 2 weeks.  Zometa is every 4 weeks.  Return in 4-6 weeks for follow-up.  Bone metastases ([place]) Bone metastases on original staging PET scan.    Zometa every 4 weeks.  Due next in 2 weeks.    ORDERS PLACED FOR THIS ENCOUNTER: No orders of the defined types were placed in this encounter.    MEDICATIONS PRESCRIBED THIS ENCOUNTER: No orders of the defined types were placed in this encounter.  THERAPY PLAN:  Continue treatment as planned.    All questions were answered. The patient knows to call the clinic with any problems, questions or concerns. We can certainly see  the patient much sooner if necessary.  Patient and plan discussed with Dr. Ancil Linsey and she is in agreement with the aforementioned.   This note is electronically signed by: Doy Mince 05/18/2016 7:35 PM

## 2016-05-18 NOTE — Patient Instructions (Signed)
[place] at [facility] Discharge Instructions  RECOMMENDATIONS MADE BY THE CONSULTANT AND ANY TEST RESULTS WILL BE SENT TO YOUR REFERRING PHYSICIAN.  Exam done and seen today by [facility] and scans reviewed today. Treatment every 2 weeks, zometa in 2 weeks Labs pre-treatment Return to see the Doctor in 6 weeks Call the clinic for any concerns or questions.   Thank you for choosing [place] at [facility] to provide your oncology and hematology care.  To afford each patient quality time with our provider, please arrive at least 15 minutes before your scheduled appointment time.   Beginning January 23rd 2017 lab work for the [facility] will be done in the  Main lab at [facility] on 1st floor. If you have a lab appointment with the [facility] please come in thru the  Main Entrance and check in at the main information desk  You need to re-schedule your appointment should you arrive 10 or more minutes late.  We strive to give you quality time with our providers, and arriving late affects you and other patients whose appointments are after yours.  Also, if you no show three or more times for appointments you may be dismissed from the clinic at the providers discretion.     Again, thank you for choosing [facility].  Our hope is that these requests will decrease the amount of time that you wait before being seen by our physicians.       _____________________________________________________________  Should you have questions after your visit to [facility], please contact our office at [phone] between the hours of 8:30 a.m. and 4:30 p.m.  Voicemails left after 4:30 p.m. will not be returned until the following business day.  For prescription refill requests, have your pharmacy contact our office.         Resources For Cancer Patients and their Caregivers ? American Cancer Society: Can assist with  transportation, wigs, general needs, runs Look Good Feel Better.        458-731-1525 ? Cancer Care: Provides financial assistance, online support groups, medication/co-pay assistance.  1-800-813-HOPE (705)457-0787) ? [place] Assists [place] cancer patients and their families through emotional , educational and financial support.  2161997742 ? [place] DSS Where to apply for food stamps, Medicaid and utility assistance. 930 510 3019 ? [facility]: Transportation to medical appointments. 518-566-8682 ? Social Security Administration: May apply for disability if have a Stage IV cancer. 858-224-4950 (831)761-0763 ? [employer], Disability and Transit Services: Assists with nutrition, care and transit needs. [place] Support Programs: '@10RELATIVEDAYS'$ @ > Cancer Support Group  2nd Tuesday of the month 1pm-2pm, Journey Room  > Creative Journey  3rd Tuesday of the month 1130am-1pm, Journey Room  > Look Good Feel Better  1st Wednesday of the month 10am-12 noon, Journey Room (Call [place] to register [phone])

## 2016-05-18 NOTE — Assessment & Plan Note (Signed)
Stage IV right adenocarcinoma pancoast tumor of the lung (EGFR and ALK negative) complicated by right brachial plexus neuralgia.  S/P XRT.  Currently on Nivolumab salvage therapy with Zometa for bone metastasis.  Oncology history is updated.  Pre-treatment labs today.  I personally reviewed and went over laboratory results with the patient.  The results are noted within this dictation. Pre-chemotherapy labs today satisfy treatment criteria.  TSH has been monitored and WNL most recently.  She continues to smoke.  She is down to smoking 1 cigarette daily (compared to 3-5 daily in the past).  Smoking cessation encouraged.  She reports that she will be quitting prior to her follow-up appointment.  ZOMETA is due in 2 weeks.  I personally reviewed and went over radiographic studies with the patient.  The results are noted within this dictation.  CT scans are negative for any progression or new metastatic disease.  Continue with Nivolumab every 2 weeks.  Zometa is every 4 weeks.  Return in 4-6 weeks for follow-up.

## 2016-05-29 ENCOUNTER — Encounter (HOSPITAL_COMMUNITY): Payer: Self-pay | Admitting: Hematology & Oncology

## 2016-06-01 ENCOUNTER — Inpatient Hospital Stay (HOSPITAL_COMMUNITY): Payer: 59

## 2016-06-01 ENCOUNTER — Encounter (HOSPITAL_COMMUNITY): Payer: 59 | Attending: Hematology and Oncology

## 2016-06-01 VITALS — BP 108/62 | HR 75 | Temp 97.9°F | Resp 16 | Wt 88.5 lb

## 2016-06-01 DIAGNOSIS — I10 Essential (primary) hypertension: Secondary | ICD-10-CM | POA: Diagnosis not present

## 2016-06-01 DIAGNOSIS — F17210 Nicotine dependence, cigarettes, uncomplicated: Secondary | ICD-10-CM | POA: Diagnosis not present

## 2016-06-01 DIAGNOSIS — Z79899 Other long term (current) drug therapy: Secondary | ICD-10-CM | POA: Diagnosis not present

## 2016-06-01 DIAGNOSIS — C781 Secondary malignant neoplasm of mediastinum: Secondary | ICD-10-CM | POA: Insufficient documentation

## 2016-06-01 DIAGNOSIS — C7989 Secondary malignant neoplasm of other specified sites: Secondary | ICD-10-CM | POA: Insufficient documentation

## 2016-06-01 DIAGNOSIS — K219 Gastro-esophageal reflux disease without esophagitis: Secondary | ICD-10-CM | POA: Diagnosis not present

## 2016-06-01 DIAGNOSIS — C7951 Secondary malignant neoplasm of bone: Secondary | ICD-10-CM | POA: Diagnosis not present

## 2016-06-01 DIAGNOSIS — C3411 Malignant neoplasm of upper lobe, right bronchus or lung: Secondary | ICD-10-CM | POA: Insufficient documentation

## 2016-06-01 DIAGNOSIS — E278 Other specified disorders of adrenal gland: Secondary | ICD-10-CM

## 2016-06-01 DIAGNOSIS — J449 Chronic obstructive pulmonary disease, unspecified: Secondary | ICD-10-CM | POA: Diagnosis not present

## 2016-06-01 DIAGNOSIS — C7972 Secondary malignant neoplasm of left adrenal gland: Secondary | ICD-10-CM | POA: Diagnosis not present

## 2016-06-01 DIAGNOSIS — C7971 Secondary malignant neoplasm of right adrenal gland: Secondary | ICD-10-CM | POA: Diagnosis not present

## 2016-06-01 DIAGNOSIS — Z9089 Acquired absence of other organs: Secondary | ICD-10-CM | POA: Insufficient documentation

## 2016-06-01 DIAGNOSIS — E785 Hyperlipidemia, unspecified: Secondary | ICD-10-CM | POA: Diagnosis not present

## 2016-06-01 DIAGNOSIS — C772 Secondary and unspecified malignant neoplasm of intra-abdominal lymph nodes: Secondary | ICD-10-CM | POA: Diagnosis not present

## 2016-06-01 DIAGNOSIS — Z5112 Encounter for antineoplastic immunotherapy: Secondary | ICD-10-CM

## 2016-06-01 LAB — COMPREHENSIVE METABOLIC PANEL
ALBUMIN: 4.3 g/dL (ref 3.5–5.0)
ALT: 11 U/L — ABNORMAL LOW (ref 14–54)
ANION GAP: 8 (ref 5–15)
AST: 19 U/L (ref 15–41)
Alkaline Phosphatase: 59 U/L (ref 38–126)
BUN: 21 mg/dL — ABNORMAL HIGH (ref 6–20)
CHLORIDE: 101 mmol/L (ref 101–111)
CO2: 25 mmol/L (ref 22–32)
Calcium: 10.1 mg/dL (ref 8.9–10.3)
Creatinine, Ser: 1.31 mg/dL — ABNORMAL HIGH (ref 0.44–1.00)
GFR calc non Af Amer: 44 mL/min — ABNORMAL LOW (ref >60)
GFR, EST AFRICAN AMERICAN: 51 mL/min — AB (ref >60)
Glucose, Bld: 115 mg/dL — ABNORMAL HIGH (ref 65–99)
Potassium: 3.9 mmol/L (ref 3.5–5.1)
SODIUM: 134 mmol/L — AB (ref 135–145)
Total Bilirubin: 0.5 mg/dL (ref 0.3–1.2)
Total Protein: 8 g/dL (ref 6.5–8.1)

## 2016-06-01 LAB — CBC WITH DIFFERENTIAL/PLATELET
BASOS PCT: 0 %
Basophils Absolute: 0 10*3/uL (ref 0.0–0.1)
EOS ABS: 0.2 10*3/uL (ref 0.0–0.7)
EOS PCT: 3 %
HCT: 34.3 % — ABNORMAL LOW (ref 36.0–46.0)
Hemoglobin: 11.4 g/dL — ABNORMAL LOW (ref 12.0–15.0)
LYMPHS ABS: 1.8 10*3/uL (ref 0.7–4.0)
Lymphocytes Relative: 23 %
MCH: 30.6 pg (ref 26.0–34.0)
MCHC: 33.2 g/dL (ref 30.0–36.0)
MCV: 92 fL (ref 78.0–100.0)
MONOS PCT: 8 %
Monocytes Absolute: 0.7 10*3/uL (ref 0.1–1.0)
Neutro Abs: 5.2 10*3/uL (ref 1.7–7.7)
Neutrophils Relative %: 66 %
PLATELETS: 275 10*3/uL (ref 150–400)
RBC: 3.73 MIL/uL — AB (ref 3.87–5.11)
RDW: 15.2 % (ref 11.5–15.5)
WBC: 7.9 10*3/uL (ref 4.0–10.5)

## 2016-06-01 MED ORDER — ZOLEDRONIC ACID 4 MG/5ML IV CONC
3.3000 mg | Freq: Once | INTRAVENOUS | Status: AC
  Administered 2016-06-01: 3.3 mg via INTRAVENOUS
  Filled 2016-06-01: qty 4.13

## 2016-06-01 MED ORDER — HEPARIN SOD (PORK) LOCK FLUSH 100 UNIT/ML IV SOLN
INTRAVENOUS | Status: AC
  Filled 2016-06-01: qty 5

## 2016-06-01 MED ORDER — SODIUM CHLORIDE 0.9 % IV SOLN
Freq: Once | INTRAVENOUS | Status: AC
  Administered 2016-06-01: 11:00:00 via INTRAVENOUS

## 2016-06-01 MED ORDER — OXYCODONE HCL 10 MG PO TABS: 10.0000 mg | ORAL_TABLET | Freq: Four times a day (QID) | ORAL | Status: DC | PRN

## 2016-06-01 MED ORDER — SODIUM CHLORIDE 0.9 % IJ SOLN: 10.0000 mL | INTRAMUSCULAR | Status: DC | PRN

## 2016-06-01 MED ORDER — HEPARIN SOD (PORK) LOCK FLUSH 100 UNIT/ML IV SOLN
500.0000 [IU] | Freq: Once | INTRAVENOUS | Status: AC | PRN
  Administered 2016-06-01: 500 [IU]

## 2016-06-01 MED ORDER — SODIUM CHLORIDE 0.9 % IV SOLN
240.0000 mg | Freq: Once | INTRAVENOUS | Status: AC
  Administered 2016-06-01: 240 mg via INTRAVENOUS
  Filled 2016-06-01: qty 4

## 2016-06-01 NOTE — Progress Notes (Signed)
Tolerated Opdivo and Zometa without any problems.

## 2016-06-08 ENCOUNTER — Telehealth (HOSPITAL_COMMUNITY): Payer: Self-pay

## 2016-06-08 NOTE — Telephone Encounter (Signed)
Spoke with patient's daughter and told her to just look at the label and pick whichever med meets her symptoms. INformed her to consult with a pharmacist if she had questions.

## 2016-06-15 ENCOUNTER — Encounter (HOSPITAL_COMMUNITY): Payer: Self-pay

## 2016-06-15 ENCOUNTER — Inpatient Hospital Stay (HOSPITAL_COMMUNITY): Payer: 59

## 2016-06-15 ENCOUNTER — Encounter (HOSPITAL_BASED_OUTPATIENT_CLINIC_OR_DEPARTMENT_OTHER): Payer: 59

## 2016-06-15 VITALS — BP 98/60 | HR 79 | Temp 98.3°F | Resp 18

## 2016-06-15 DIAGNOSIS — Z5112 Encounter for antineoplastic immunotherapy: Secondary | ICD-10-CM

## 2016-06-15 DIAGNOSIS — C7951 Secondary malignant neoplasm of bone: Secondary | ICD-10-CM | POA: Diagnosis not present

## 2016-06-15 DIAGNOSIS — C3411 Malignant neoplasm of upper lobe, right bronchus or lung: Secondary | ICD-10-CM

## 2016-06-15 DIAGNOSIS — E278 Other specified disorders of adrenal gland: Secondary | ICD-10-CM

## 2016-06-15 LAB — CBC WITH DIFFERENTIAL/PLATELET
BASOS PCT: 0 %
Basophils Absolute: 0 10*3/uL (ref 0.0–0.1)
EOS ABS: 0.2 10*3/uL (ref 0.0–0.7)
EOS PCT: 2 %
HCT: 34.4 % — ABNORMAL LOW (ref 36.0–46.0)
Hemoglobin: 11.4 g/dL — ABNORMAL LOW (ref 12.0–15.0)
LYMPHS ABS: 2.3 10*3/uL (ref 0.7–4.0)
Lymphocytes Relative: 26 %
MCH: 30.6 pg (ref 26.0–34.0)
MCHC: 33.1 g/dL (ref 30.0–36.0)
MCV: 92.2 fL (ref 78.0–100.0)
MONOS PCT: 5 %
Monocytes Absolute: 0.4 10*3/uL (ref 0.1–1.0)
NEUTROS PCT: 67 %
Neutro Abs: 6 10*3/uL (ref 1.7–7.7)
PLATELETS: 302 10*3/uL (ref 150–400)
RBC: 3.73 MIL/uL — AB (ref 3.87–5.11)
RDW: 15.1 % (ref 11.5–15.5)
WBC: 8.9 10*3/uL (ref 4.0–10.5)

## 2016-06-15 LAB — COMPREHENSIVE METABOLIC PANEL
ALBUMIN: 4.2 g/dL (ref 3.5–5.0)
ALT: 10 U/L — ABNORMAL LOW (ref 14–54)
ANION GAP: 12 (ref 5–15)
AST: 19 U/L (ref 15–41)
Alkaline Phosphatase: 62 U/L (ref 38–126)
BUN: 21 mg/dL — ABNORMAL HIGH (ref 6–20)
CHLORIDE: 101 mmol/L (ref 101–111)
CO2: 24 mmol/L (ref 22–32)
Calcium: 10.2 mg/dL (ref 8.9–10.3)
Creatinine, Ser: 1.49 mg/dL — ABNORMAL HIGH (ref 0.44–1.00)
GFR calc non Af Amer: 37 mL/min — ABNORMAL LOW (ref >60)
GFR, EST AFRICAN AMERICAN: 43 mL/min — AB (ref >60)
GLUCOSE: 108 mg/dL — AB (ref 65–99)
Potassium: 3.6 mmol/L (ref 3.5–5.1)
SODIUM: 137 mmol/L (ref 135–145)
Total Bilirubin: 0.3 mg/dL (ref 0.3–1.2)
Total Protein: 8.1 g/dL (ref 6.5–8.1)

## 2016-06-15 LAB — TSH: TSH: 0.845 u[IU]/mL (ref 0.350–4.500)

## 2016-06-15 MED ORDER — SODIUM CHLORIDE 0.9 % IJ SOLN
10.0000 mL | INTRAMUSCULAR | Status: DC | PRN
  Administered 2016-06-15: 10 mL
  Filled 2016-06-15: qty 10

## 2016-06-15 MED ORDER — SODIUM CHLORIDE 0.9 % IV SOLN
Freq: Once | INTRAVENOUS | Status: AC
  Administered 2016-06-15: 12:00:00 via INTRAVENOUS

## 2016-06-15 MED ORDER — HEPARIN SOD (PORK) LOCK FLUSH 100 UNIT/ML IV SOLN
500.0000 [IU] | Freq: Once | INTRAVENOUS | Status: AC | PRN
  Administered 2016-06-15: 500 [IU]

## 2016-06-15 MED ORDER — SODIUM CHLORIDE 0.9 % IV SOLN
240.0000 mg | Freq: Once | INTRAVENOUS | Status: AC
  Administered 2016-06-15: 240 mg via INTRAVENOUS
  Filled 2016-06-15: qty 20

## 2016-06-15 NOTE — Progress Notes (Signed)
1400:  Tolerated infusion w/o adverse reaction.  A&Ox4, in no distress.  VSS.  Discharged ambulatory.

## 2016-06-15 NOTE — Patient Instructions (Signed)
[facility] Discharge Instructions for Patients Receiving Chemotherapy   Beginning January 23rd 2017 lab work for the Cancer Center will be done in the  Main lab at [facility] on 1st floor. If you have a lab appointment with the Cancer Center please come in thru the  Main Entrance and check in at the main information desk   Today you received the following chemotherapy agents:  Opdivo  If you develop nausea and vomiting, or diarrhea that is not controlled by your medication, call the clinic.  The clinic phone number is [phone]. Office hours are Monday-Friday 8:30am-5:00pm.  BELOW ARE SYMPTOMS THAT SHOULD BE REPORTED IMMEDIATELY:  *FEVER GREATER THAN 101.0 F  *CHILLS WITH OR WITHOUT FEVER  NAUSEA AND VOMITING THAT IS NOT CONTROLLED WITH YOUR NAUSEA MEDICATION  *UNUSUAL SHORTNESS OF BREATH  *UNUSUAL BRUISING OR BLEEDING  TENDERNESS IN MOUTH AND THROAT WITH OR WITHOUT PRESENCE OF ULCERS  *URINARY PROBLEMS  *BOWEL PROBLEMS  UNUSUAL RASH Items with * indicate a potential emergency and should be followed up as soon as possible. If you have an emergency after office hours please contact your primary care physician or go to the nearest emergency department.  Please call the clinic during office hours if you have any questions or concerns.   You may also contact the Patient Navigator at [phone] should you have any questions or need assistance in obtaining follow up care.      Resources For Cancer Patients and their Caregivers ? American Cancer Society: Can assist with transportation, wigs, general needs, runs Look Good Feel Better.        1-888-227-6333 ? Cancer Care: Provides financial assistance, online support groups, medication/co-pay assistance.  1-800-813-HOPE (4673) ? [facility] Assists [place] cancer patients and their families through emotional , educational and financial support.   336-427-4357 ? [place] DSS Where to apply for food stamps, Medicaid and utility assistance. 336-342-1394 ? RCATS: Transportation to medical appointments. 336-347-2287 ? Social Security Administration: May apply for disability if have a Stage IV cancer. 336-342-7796 1-800-772-1213 ? [place] Aging, Disability and Transit Services: Assists with nutrition, care and transit needs. 336-349-2343         

## 2016-06-29 ENCOUNTER — Encounter (HOSPITAL_BASED_OUTPATIENT_CLINIC_OR_DEPARTMENT_OTHER): Payer: 59 | Admitting: Oncology

## 2016-06-29 ENCOUNTER — Other Ambulatory Visit (HOSPITAL_COMMUNITY): Payer: Self-pay | Admitting: Oncology

## 2016-06-29 ENCOUNTER — Encounter (HOSPITAL_COMMUNITY): Payer: 59 | Attending: Hematology and Oncology

## 2016-06-29 ENCOUNTER — Encounter (HOSPITAL_COMMUNITY): Payer: Self-pay

## 2016-06-29 ENCOUNTER — Ambulatory Visit (HOSPITAL_COMMUNITY): Payer: 59 | Admitting: Oncology

## 2016-06-29 VITALS — BP 115/73 | HR 56 | Temp 98.6°F | Resp 16 | Ht 59.06 in | Wt 89.6 lb

## 2016-06-29 DIAGNOSIS — E278 Other specified disorders of adrenal gland: Secondary | ICD-10-CM

## 2016-06-29 DIAGNOSIS — F17210 Nicotine dependence, cigarettes, uncomplicated: Secondary | ICD-10-CM | POA: Insufficient documentation

## 2016-06-29 DIAGNOSIS — C7972 Secondary malignant neoplasm of left adrenal gland: Secondary | ICD-10-CM | POA: Diagnosis not present

## 2016-06-29 DIAGNOSIS — E785 Hyperlipidemia, unspecified: Secondary | ICD-10-CM | POA: Insufficient documentation

## 2016-06-29 DIAGNOSIS — J449 Chronic obstructive pulmonary disease, unspecified: Secondary | ICD-10-CM | POA: Insufficient documentation

## 2016-06-29 DIAGNOSIS — C7951 Secondary malignant neoplasm of bone: Secondary | ICD-10-CM | POA: Diagnosis not present

## 2016-06-29 DIAGNOSIS — C3411 Malignant neoplasm of upper lobe, right bronchus or lung: Secondary | ICD-10-CM | POA: Diagnosis present

## 2016-06-29 DIAGNOSIS — Z95828 Presence of other vascular implants and grafts: Secondary | ICD-10-CM

## 2016-06-29 DIAGNOSIS — K219 Gastro-esophageal reflux disease without esophagitis: Secondary | ICD-10-CM | POA: Diagnosis not present

## 2016-06-29 DIAGNOSIS — I10 Essential (primary) hypertension: Secondary | ICD-10-CM | POA: Diagnosis not present

## 2016-06-29 DIAGNOSIS — C7989 Secondary malignant neoplasm of other specified sites: Secondary | ICD-10-CM | POA: Diagnosis not present

## 2016-06-29 DIAGNOSIS — C781 Secondary malignant neoplasm of mediastinum: Secondary | ICD-10-CM | POA: Diagnosis not present

## 2016-06-29 DIAGNOSIS — Z5112 Encounter for antineoplastic immunotherapy: Secondary | ICD-10-CM

## 2016-06-29 DIAGNOSIS — C772 Secondary and unspecified malignant neoplasm of intra-abdominal lymph nodes: Secondary | ICD-10-CM | POA: Insufficient documentation

## 2016-06-29 DIAGNOSIS — C7971 Secondary malignant neoplasm of right adrenal gland: Secondary | ICD-10-CM | POA: Insufficient documentation

## 2016-06-29 DIAGNOSIS — Z9089 Acquired absence of other organs: Secondary | ICD-10-CM | POA: Insufficient documentation

## 2016-06-29 DIAGNOSIS — Z79899 Other long term (current) drug therapy: Secondary | ICD-10-CM | POA: Insufficient documentation

## 2016-06-29 DIAGNOSIS — Z720 Tobacco use: Secondary | ICD-10-CM

## 2016-06-29 LAB — CBC WITH DIFFERENTIAL/PLATELET
Basophils Absolute: 0 10*3/uL (ref 0.0–0.1)
Basophils Relative: 0 %
EOS PCT: 1 %
Eosinophils Absolute: 0.1 10*3/uL (ref 0.0–0.7)
HCT: 35.3 % — ABNORMAL LOW (ref 36.0–46.0)
Hemoglobin: 11.7 g/dL — ABNORMAL LOW (ref 12.0–15.0)
LYMPHS ABS: 1.9 10*3/uL (ref 0.7–4.0)
LYMPHS PCT: 20 %
MCH: 30.5 pg (ref 26.0–34.0)
MCHC: 33.1 g/dL (ref 30.0–36.0)
MCV: 92.2 fL (ref 78.0–100.0)
MONO ABS: 0.7 10*3/uL (ref 0.1–1.0)
Monocytes Relative: 8 %
Neutro Abs: 6.8 10*3/uL (ref 1.7–7.7)
Neutrophils Relative %: 71 %
PLATELETS: 292 10*3/uL (ref 150–400)
RBC: 3.83 MIL/uL — ABNORMAL LOW (ref 3.87–5.11)
RDW: 14.9 % (ref 11.5–15.5)
WBC: 9.5 10*3/uL (ref 4.0–10.5)

## 2016-06-29 LAB — COMPREHENSIVE METABOLIC PANEL
ALK PHOS: 68 U/L (ref 38–126)
ALT: 9 U/L — AB (ref 14–54)
AST: 14 U/L — ABNORMAL LOW (ref 15–41)
Albumin: 4.2 g/dL (ref 3.5–5.0)
Anion gap: 9 (ref 5–15)
BILIRUBIN TOTAL: 0.3 mg/dL (ref 0.3–1.2)
BUN: 21 mg/dL — AB (ref 6–20)
CALCIUM: 10 mg/dL (ref 8.9–10.3)
CO2: 26 mmol/L (ref 22–32)
CREATININE: 1.54 mg/dL — AB (ref 0.44–1.00)
Chloride: 99 mmol/L — ABNORMAL LOW (ref 101–111)
GFR calc Af Amer: 42 mL/min — ABNORMAL LOW (ref >60)
GFR calc non Af Amer: 36 mL/min — ABNORMAL LOW (ref >60)
GLUCOSE: 119 mg/dL — AB (ref 65–99)
Potassium: 3.5 mmol/L (ref 3.5–5.1)
Sodium: 134 mmol/L — ABNORMAL LOW (ref 135–145)
TOTAL PROTEIN: 8.1 g/dL (ref 6.5–8.1)

## 2016-06-29 MED ORDER — SODIUM CHLORIDE 0.9 % IV SOLN
240.0000 mg | Freq: Once | INTRAVENOUS | Status: AC
  Administered 2016-06-29: 240 mg via INTRAVENOUS
  Filled 2016-06-29: qty 20

## 2016-06-29 MED ORDER — HEPARIN SOD (PORK) LOCK FLUSH 100 UNIT/ML IV SOLN
500.0000 [IU] | Freq: Once | INTRAVENOUS | Status: AC | PRN
  Administered 2016-06-29: 500 [IU]

## 2016-06-29 MED ORDER — SODIUM CHLORIDE 0.9 % IV SOLN: Freq: Once | INTRAVENOUS | Status: DC

## 2016-06-29 MED ORDER — HEPARIN SOD (PORK) LOCK FLUSH 100 UNIT/ML IV SOLN
INTRAVENOUS | Status: AC
  Filled 2016-06-29: qty 5

## 2016-06-29 MED ORDER — ZOLEDRONIC ACID 4 MG/5ML IV CONC
3.0000 mg | Freq: Once | INTRAVENOUS | Status: AC
  Administered 2016-06-29: 3 mg via INTRAVENOUS
  Filled 2016-06-29: qty 3.75

## 2016-06-29 MED ORDER — SODIUM CHLORIDE 0.9 % IV SOLN
Freq: Once | INTRAVENOUS | Status: AC
  Administered 2016-06-29: 11:00:00 via INTRAVENOUS

## 2016-06-29 MED ORDER — SODIUM CHLORIDE 0.9 % IJ SOLN
10.0000 mL | INTRAMUSCULAR | Status: DC | PRN
  Administered 2016-06-29: 10 mL
  Filled 2016-06-29: qty 10

## 2016-06-29 NOTE — Progress Notes (Signed)
[name], stage IV, bilateral adrenal metastases, retroperitoneal lymph nodes  Biopsy on 08/01/2014 c/w poorly differentiated adenocarcinoma  XRT completed on 10/27/2014  Right Brachial plexus neuralgia  EGFR mutation negative ALK mutation negative    Cancer of upper lobe of right lung ([place])   07/28/2014 Imaging    CT chest: Large R apical mass consistent with malignancy. This is destroying the R 2nd rib with extension into adjacent soft tissue. R hilar adenopathy with R 5cm adrenal metastatic lesion.     08/01/2014 Initial Biopsy    Lung, needle/core biopsy(ies), right upper lobe - POORLY DIFFERENTIATED ADENOCARCINOMA, SEE COMMENT.     08/08/2014 PET scan    Large hypermetabolic R apical mass with evidence of direct chest wall and mediastinal invasion, right retrocrural lymphadenopathy, extensive retroperitoneal lymphadenopathy, and metastatic lesions to the adrenal glands      09/02/2014 - 11/04/2014 Chemotherapy    Cisplatin/Pemetrexed/Avastin every 21 days x 4 cycles     10/07/2014 - 10/27/2014 Radiation Therapy    Right lung apex for control of brachioplexopathy.     12/24/2014 - 02/25/2015 Chemotherapy    Alimta/Avastin every 21 days.     02/20/2015 Imaging    Increase in size of right adrenal metastasis and subjacent confluent retrocaval lymphadenopathy     02/25/2015 -  Chemotherapy    Nivolumab, zometa     05/04/2015 Imaging    CT CAP- Stable to slight decrease in the posterior right apical lesion. Stable appearance of posterior right upper rib an upper thoracic bony lesions. Slight improvement in right upper lobe tree-in-bud opacity. No new or progressive findings in...     07/28/2015 Imaging    CT CAP- Reduced size of the right apical pleural parenchymal lesion and reduced size of the right adrenal metastatic lesion. Resolution of prior retrocrural adenopathy.  Right eccentric T1 and T2 sclerosis with sclerosis and tapering of the right second..     11/17/2015 Imaging     CT CAP- Stable soft tissue thickening in the apex of the right hemi thorax. Stable right adrenal metastasis. Nodularity along the trachea and mainstem bronchi, relatively new from 07/28/2015, favoring adherent debris.     11/18/2015 Treatment Plan Change    Zometa HELD for upcoming tooth extraction     11/24/2015 Treatment Plan Change    Zometa on hold at this time in preparation for tooth extraction in March 2017.  Zometa las given on 11/18/2015.     02/03/2016 Imaging    CT CAP- Heterogeneous right apical masslike consolidation and right adrenal metastasis are unchanged     04/06/2016 Treatment Plan Change    Zometa restarted 6 weeks out from tooth extraction (04/06/2016)     05/12/2016 Imaging    CT CAP- NED in the chest, abdomen or pelvis.  Some areas of nodularity associated with the mainstem bronchi in the left upper lobe bronchus, favored to represent adherent inspissated secretions       CURRENT THERAPY: Nivolumab/zometa/Aranesp  INTERVAL HISTORY: Melissa Sullivan 60 y.o. female seen in Dr Donald Pore absence, alone for visit, as she continues treatment for stage IV poorly differentiated adenocarcinoma of right lung, Pancoast tumor,  metastatic to bone, bilateral adrenals, retroperitoneal nodes and with brachial plexus neuropathy. Patient has had good response to present nivolumab, cycle 21 today,  and q 4 week zometa also due today.  Last imaging was CT CAP 05-13-16. Marland Kitchen Patient reports that she is feeling well overall. She notices slight fatigue after treatments, encouraged to drink  gatorade after zometa. She denies SOB, has only minimal and NP cough, no chest pain. She has occasional discomfort upper right arm which is positional or activity related, and no longer has any numbness radiating to the forearm. She has decreased cigarettes to 2 cigarettes daily; complete smoking cessation encouraged. No other pain. Appetite is adequate, no nausea, no significant GERD and no taste changes.  Bowels are moving adequately. No fever or symptoms of infection. She is about at usual weight "I have never been heavy".  This week she has had some difficulty falling asleep, does not nap during day, but also not as physically active as in past.  .    MEDICAL HISTORY: Past Medical History:  Diagnosis Date   Adrenal mass, right ([place]) 07/28/2014   Bone metastases ([place]) 04/05/2016   Cancer ([place])    lung  right   Diabetes mellitus without complication ([place])    Hyperlipidemia    Hypertension    Lung mass 07/28/2014   Reflux     has Adrenal mass, right ([place]); Cancer of upper lobe of right lung ([place]); ARF (acute renal failure) ([place]); Abnormal finding on imaging; Oropharyngeal dysphagia; Nausea and vomiting; Hypercalcemia; Anemia in neoplastic disease; Dysphagia; Encounter for screening colonoscopy; and Bone metastases ([place]) on her problem list.      has No Known Allergies.  Melissa Sullivan does not currently have medications on file.  SURGICAL HISTORY: Past Surgical History:  Procedure Laterality Date   APPENDECTOMY     ESOPHAGOGASTRODUODENOSCOPY N/A 12/09/2014   YDS:WVTVNR esophageal stricture/mild-to-noderate erosive gastritis. negative H.pylori   FLEXIBLE SIGMOIDOSCOPY  2011   Dr. [name]: hyperplastic polyp   LUNG BIOPSY Right 07/2014   CT guided   MALONEY DILATION N/A 12/09/2014   Procedure: MALONEY DILATION;  Surgeon: [name], MD;  Location: [facility] END[facility] SUITE;  Service: Endoscopy;  Laterality: N/A;   PORTACATH PLACEMENT Left 09/01/2014   SAVORY DILATION N/A 12/09/2014   Procedure: SAVORY DILATION;  Surgeon: [name], MD;  Location: [facility] END[facility] SU[facility];  Service: Endoscopy;  Laterality: N/A;    SOCIAL HISTORY: Social History   Social History   Marital status: Married    Spouse name: N/A   Number of children: N/A   Years of education: N/A   Occupational History   Not on file.   Social History Main Topics   Smoking status: Light Tobacco Smoker     Packs/day: 0.30    Types: Cigarettes   Smokeless tobacco: Never Used   Alcohol use No   Drug use: No   Sexual activity: Not on file   Other Topics Concern   Not on file   Social History Narrative   No narrative on file    FAMILY HISTORY: Family History  Problem Relation Age of Onset   Cancer Sister     Review of Systems  Constitutional: Positive for malaise/fatigue. Negative for fever and chills.  HENT: Negative for congestion, hearing loss, nosebleeds, sore throat and tinnitus.   Eyes: Negative for blurred vision, double vision, pain and discharge.  Respiratory: Negative for cough, hemoptysis, sputum production, shortness of breath and wheezing.   Cardiovascular: Negative for chest pain, palpitations, claudication, leg swelling and PND.  Gastrointestinal: . Negative for heartburn, nausea, vomiting, diarrhea, constipation, blood in stool and melena.  Genitourinary: Negative for dysuria, urgency, frequency and hematuria.  Musculoskeletal: Positive for shoulder pain. Negative for myalgias, joint pain and falls. Shoulder pain, improved.  Skin: Negative for itching and rash.  Neurological: Negative for dizziness, tingling,  tremors, sensory change, speech change, focal weakness, seizures, loss of consciousness, weakness and headaches.  Endo/Heme/Allergies: Does not bruise/bleed easily.  Psychiatric/Behavioral: Positive for insomnia. Negative for depression, suicidal ideas, memory loss and substance abuse. The patient is not nervous/anxious. Difficulty falling asleep.  14 point review of systems was performed and is negative except as detailed under history of present illness and above   PHYSICAL EXAMINATION  ECOG PERFORMANCE STATUS: 1 - Symptomatic but completely ambulatory  Vitals with BMI 06/29/2016  Height   Weight 89 lbs 10 oz  BMI   Systolic 824  Diastolic 99  Pulse 88  Respirations 18    There were no vitals filed for this visit.  Physical Exam    Constitutional: Comfortable appearing, changes position easily in infusion chair, respirations not labored RA. Thin HENT: no alopecia Head: Normocephalic and atraumatic.  Nose: Nose normal.  Mouth/Throat: Oropharynx is clear and moist.  Eyes: Pupils are equal, round, and reactive to light. No scleral icterus.  Neck: No JVD upright Cardiovascular: Normal rate, regular rhythm and normal heart sounds.  Exam reveals no gallop and no friction rub. Pulmonary/Chest: Effort normal and breath sounds normal. She has no wheezes. She has no rales.  Abdominal: Soft. Bowel sounds are normal. She exhibits no distension and no mass. There is no tenderness. There is no rebound and no guarding.  Musculoskeletal: Normal range of motion. She exhibits no edema.  Lymphadenopathy:    She has no cervical or supraclavicular adenopathy.  Neurological: She is alert and oriented, speech fluent and appropriate, no focal deficits on exam in infusion chair Skin: Skin is warm and dry. No rash noted.  Psychiatric:appropriate mood and affect PAC infusing without difficulty, site not remarkable.   Nursing note and vitals reviewed.   LABORATORY DATA: I have reviewed the data below as listed. CBC    Component Value Date/Time   WBC 9.5 06/29/2016 1005   RBC 3.83 (L) 06/29/2016 1005   HGB 11.7 (L) 06/29/2016 1005   HCT 35.3 (L) 06/29/2016 1005   PLT 292 06/29/2016 1005   MCV 92.2 06/29/2016 1005   MCH 30.5 06/29/2016 1005   MCHC 33.1 06/29/2016 1005   RDW 14.9 06/29/2016 1005   LYMPHSABS 1.9 06/29/2016 1005   MONOABS 0.7 06/29/2016 1005   EOSABS 0.1 06/29/2016 1005   BASOSABS 0.0 06/29/2016 1005   Results for Melissa, Sullivan (MRN [id number]) as of 06/29/2016 10:42  Ref. Range 06/29/2016 10:05  Sodium Latest Ref Range: 135 - 145 mmol/L 134 (L)  Potassium Latest Ref Range: 3.5 - 5.1 mmol/L 3.5  Chloride Latest Ref Range: 101 - 111 mmol/L 99 (L)  CO2 Latest Ref Range: 22 - 32 mmol/L 26  BUN Latest Ref Range: 6 -  20 mg/dL 21 (H)  Creatinine Latest Ref Range: 0.44 - 1.00 mg/dL 1.54 (H)  Calcium Latest Ref Range: 8.9 - 10.3 mg/dL 10.0  EGFR (Non-African Amer.) Latest Ref Range: >60 mL/min 36 (L)  EGFR (African American) Latest Ref Range: >60 mL/min 42 (L)  Glucose Latest Ref Range: 65 - 99 mg/dL 119 (H)  Anion gap Latest Ref Range: 5 - 15  9  Alkaline Phosphatase Latest Ref Range: 38 - 126 U/L 68  Albumin Latest Ref Range: 3.5 - 5.0 g/dL 4.2  AST Latest Ref Range: 15 - 41 U/L 14 (L)  ALT Latest Ref Range: 14 - 54 U/L 9 (L)  Total Protein Latest Ref Range: 6.5 - 8.1 g/dL 8.1  Total Bilirubin Latest Ref Range: 0.3 -  1.2 mg/dL 0.3  WBC Latest Ref Range: 4.0 - 10.5 K/uL 9.5  RBC Latest Ref Range: 3.87 - 5.11 MIL/uL 3.83 (L)  Hemoglobin Latest Ref Range: 12.0 - 15.0 g/dL 11.7 (L)  HCT Latest Ref Range: 36.0 - 46.0 % 35.3 (L)  MCV Latest Ref Range: 78.0 - 100.0 fL 92.2  MCH Latest Ref Range: 26.0 - 34.0 pg 30.5  MCHC Latest Ref Range: 30.0 - 36.0 g/dL 33.1  RDW Latest Ref Range: 11.5 - 15.5 % 14.9  Platelets Latest Ref Range: 150 - 400 K/uL 292  Neutrophils Latest Units: % 71  Lymphocytes Latest Units: % 20  Monocytes Relative Latest Units: % 8  Eosinophil Latest Units: % 1  Basophil Latest Units: % 0  NEUT# Latest Ref Range: 1.7 - 7.7 K/uL 6.8  Lymphocyte # Latest Ref Range: 0.7 - 4.0 K/uL 1.9  Monocyte # Latest Ref Range: 0.1 - 1.0 K/uL 0.7  Eosinophils Absolute Latest Ref Range: 0.0 - 0.7 K/uL 0.1  Basophils Absolute Latest Ref Range: 0.0 - 0.1 K/uL 0.0     RADIOLOGY: I have personally reviewed the radiological images as listed and agreed with the findings in the report. Study Result   CLINICAL DATA:  60 year old female with history of right upper lobe non-small-cell bronchogenic carcinoma with bone metastasis. Restaging examination.  EXAM: CT CHEST, ABDOMEN, AND PELVIS WITH CONTRAST  TECHNIQUE: Multidetector CT imaging of the chest, abdomen and pelvis was performed following the  standard protocol during bolus administration of intravenous contrast.  CONTRAST:  156m ISOVUE-300 IOPAMIDOL (ISOVUE-300) INJECTION 61%  COMPARISON:  CT the chest, abdomen and pelvis 02/03/2016.  FINDINGS: CT CHEST FINDINGS  Mediastinum/Lymph Nodes: Heart size is normal. Heart size is normal. There is no significant pericardial fluid, thickening or pericardial calcification. Left-sided internal jugular single-lumen porta cath with tip terminating in the distal superior vena cava. No pathologically enlarged mediastinal or hilar lymph nodes. Esophagus is unremarkable in appearance. No axillary lymphadenopathy.  Lungs/Pleura: Again noted is a pleural-based area of chronic partially calcified mass like consolidation in the apex of the right upper lobe which is similar to the prior study, most compatible with chronic postradiation mass-like fibrosis. No definite findings to suggest local recurrence of disease. No new suspicious appearing pulmonary nodules or masses are noted. There are few scattered peripheral predominant tiny nodules that are most compatible with areas of chronic mucoid impaction within terminal bronchioles, which are unchanged. Diffuse bronchial wall thickening with moderate centrilobular and paraseptal emphysema. In the mainstem bronchi and bilaterally (left greater than right) and in the proximal left upper lobe bronchus there are several nodular densities associated with the nondependent walls of the bronchi which are suspicious for endobronchial nodules, however, this could alternatively represent adherent secretions. The largest of these is 5 mm on image 51 of series 6. No acute consolidative airspace disease. No pleural effusions.  Musculoskeletal/Soft Tissues: Areas of regular sclerosis and lucency in the right-sided the T2 vertebral body appear very similar to the prior study, presumably a treated metastatic lesion. No other aggressive appearing  lytic or blastic lesions are noted elsewhere in the visualized axial or appendicular skeleton.  CT ABDOMEN AND PELVIS FINDINGS  Hepatobiliary: No cystic or solid hepatic lesions. No intra or extrahepatic biliary ductal dilatation. Gallbladder is normal in appearance.  Pancreas: No pancreatic mass. No pancreatic ductal dilatation. No pancreatic or peripancreatic fluid or inflammatory changes.  Spleen: Unremarkable.  Adrenals/Urinary Tract: Complex soft tissue lesion which appears to extend from the right adrenal gland into  the retro aortic region appears similar to the prior study. This lesion is highly irregular in shape and therefore difficult to measure, but when measured in a similar fashion to the prior examination on image 56 of series 2 of today's study measures 3.8 x 2.4 cm (previously 4.0 x 2.4 cm). Left adrenal gland is normal in appearance. Bilateral kidneys are normal in appearance.  Stomach/Bowel: Normal appearance of the stomach. No pathologic dilatation of small bowel or colon. Numerous colonic diverticular if that a few scattered colonic diverticulae are noted, without surrounding inflammatory changes to suggest an acute diverticulitis at this time. The appendix is not confidently identified and may be surgically absent. Regardless, there are no inflammatory changes noted adjacent to the cecum to suggest the presence of an acute appendicitis at this time.  Vascular/Lymphatic: Aortic atherosclerosis, as well as atherosclerotic disease throughout the remaining abdominal and pelvic vasculature, without evidence of aneurysm or dissection. No lymphadenopathy noted in the abdomen or pelvis.  Reproductive: All uterus and ovaries are atrophic.  Other: No significant volume of ascites.  No pneumoperitoneum.  Musculoskeletal: There are no aggressive appearing lytic or blastic lesions noted in the visualized portions of the skeleton.  IMPRESSION: 1. Chronic  postradiation mass-like fibrosis in the apex of the right upper lobe and treated right adrenal metastasis both appear very similar to the prior examination from 02/03/2016, without evidence of recurrence or definite new metastatic disease elsewhere in the chest, abdomen or pelvis. 2. There are some areas of nodularity associated with the mainstem bronchi in the left upper lobe bronchus, as discussed above. This is favored to represent adherent inspissated secretions, however, given that all of these appear nondependent, the possibility of endobronchial nodules is not excluded. Close attention on followup studies is recommended to ensure the stability or resolution of these findings. Alternatively, these could be further evaluated with nonemergent bronchoscopy if clinically appropriate. 3. Diffuse bronchial wall thickening with moderate centrilobular and paraseptal emphysema; imaging findings suggestive of underlying COPD. 4. Aortic atherosclerosis. 5. Additional incidental findings, as above.   Electronically Signed   By: Vinnie Langton M.D.   On: 05/12/2016 14:48    ASSESSMENT and THERAPY PLAN:   Stage IV adenocarcinoma of the lung, EGFR and ALK negative Continues to do very well on present nivolumab every 2 weeks and zometa every 4 weeks, with no significant nivilumab side effects by labs/ exam/ history. Continue as planned. Labs with each treatment and to see provider coordinating with treatment in 4 weeks.  Hypercalcemia/bony metastatic disease Calcium 10 with normal albumin, pain well controlled. Zometa as noted.   Tobacco abuse: she has decreased smoking down to 2 cigarettes in 24 hours. Encouraged cessation.  Diabetes: preceded nivolumab, on metformin. Blood sugars in good range by recent labs here.   PAC in, functioning well.  All questions were answered. The patient knows to call the clinic with any problems, questions or concerns. We can certainly see the patient  much sooner if necessary.  Nivolumab and zometa orders reviewed. Time spent 25 min including >50% counseling and coordination of care.   This document serves as a record of services personally performed by Evlyn Clines, MD. It was created on her behalf by Arlyce Harman, a trained medical scribe. The creation of this record is based on the scribe's personal observations and the provider's statements to them. This document has been checked and approved by the attending provider.  I have reviewed the above documentation for accuracy and completeness, and I agree with  the above.  This note was signed electronically  Lennis P. Marko Plume, MD

## 2016-06-29 NOTE — Progress Notes (Signed)
Tolerated chemo and zometa infusions well. Ambulatory on discharge home with daughter.

## 2016-06-30 DIAGNOSIS — Z95828 Presence of other vascular implants and grafts: Secondary | ICD-10-CM | POA: Insufficient documentation

## 2016-06-30 DIAGNOSIS — Z720 Tobacco use: Secondary | ICD-10-CM | POA: Insufficient documentation

## 2016-07-13 ENCOUNTER — Encounter (HOSPITAL_COMMUNITY): Payer: Self-pay

## 2016-07-13 ENCOUNTER — Encounter (HOSPITAL_BASED_OUTPATIENT_CLINIC_OR_DEPARTMENT_OTHER): Payer: 59

## 2016-07-13 VITALS — BP 94/60 | HR 82 | Temp 98.9°F | Resp 100 | Wt 90.2 lb

## 2016-07-13 DIAGNOSIS — C7951 Secondary malignant neoplasm of bone: Secondary | ICD-10-CM

## 2016-07-13 DIAGNOSIS — C772 Secondary and unspecified malignant neoplasm of intra-abdominal lymph nodes: Secondary | ICD-10-CM

## 2016-07-13 DIAGNOSIS — Z5112 Encounter for antineoplastic immunotherapy: Secondary | ICD-10-CM

## 2016-07-13 DIAGNOSIS — C7971 Secondary malignant neoplasm of right adrenal gland: Secondary | ICD-10-CM

## 2016-07-13 DIAGNOSIS — E278 Other specified disorders of adrenal gland: Secondary | ICD-10-CM

## 2016-07-13 DIAGNOSIS — C3411 Malignant neoplasm of upper lobe, right bronchus or lung: Secondary | ICD-10-CM | POA: Diagnosis not present

## 2016-07-13 DIAGNOSIS — C7972 Secondary malignant neoplasm of left adrenal gland: Secondary | ICD-10-CM

## 2016-07-13 LAB — COMPREHENSIVE METABOLIC PANEL
ALK PHOS: 60 U/L (ref 38–126)
ALT: 8 U/L — AB (ref 14–54)
AST: 16 U/L (ref 15–41)
Albumin: 3.8 g/dL (ref 3.5–5.0)
Anion gap: 6 (ref 5–15)
BILIRUBIN TOTAL: 0.4 mg/dL (ref 0.3–1.2)
BUN: 19 mg/dL (ref 6–20)
CHLORIDE: 102 mmol/L (ref 101–111)
CO2: 25 mmol/L (ref 22–32)
Calcium: 10.1 mg/dL (ref 8.9–10.3)
Creatinine, Ser: 1.56 mg/dL — ABNORMAL HIGH (ref 0.44–1.00)
GFR calc Af Amer: 41 mL/min — ABNORMAL LOW (ref >60)
GFR, EST NON AFRICAN AMERICAN: 35 mL/min — AB (ref >60)
GLUCOSE: 104 mg/dL — AB (ref 65–99)
POTASSIUM: 3.9 mmol/L (ref 3.5–5.1)
Sodium: 133 mmol/L — ABNORMAL LOW (ref 135–145)
Total Protein: 7.5 g/dL (ref 6.5–8.1)

## 2016-07-13 LAB — CBC WITH DIFFERENTIAL/PLATELET
BASOS ABS: 0 10*3/uL (ref 0.0–0.1)
BASOS PCT: 0 %
Eosinophils Absolute: 0.2 10*3/uL (ref 0.0–0.7)
Eosinophils Relative: 2 %
HEMATOCRIT: 31.5 % — AB (ref 36.0–46.0)
HEMOGLOBIN: 10.6 g/dL — AB (ref 12.0–15.0)
LYMPHS PCT: 26 %
Lymphs Abs: 2.1 10*3/uL (ref 0.7–4.0)
MCH: 30.7 pg (ref 26.0–34.0)
MCHC: 33.7 g/dL (ref 30.0–36.0)
MCV: 91.3 fL (ref 78.0–100.0)
Monocytes Absolute: 0.7 10*3/uL (ref 0.1–1.0)
Monocytes Relative: 8 %
NEUTROS ABS: 5.3 10*3/uL (ref 1.7–7.7)
NEUTROS PCT: 64 %
Platelets: 300 10*3/uL (ref 150–400)
RBC: 3.45 MIL/uL — AB (ref 3.87–5.11)
RDW: 15.3 % (ref 11.5–15.5)
WBC: 8.2 10*3/uL (ref 4.0–10.5)

## 2016-07-13 LAB — TSH: TSH: 1.183 u[IU]/mL (ref 0.350–4.500)

## 2016-07-13 MED ORDER — SODIUM CHLORIDE 0.9 % IJ SOLN
10.0000 mL | INTRAMUSCULAR | Status: DC | PRN
  Administered 2016-07-13: 10 mL
  Filled 2016-07-13: qty 10

## 2016-07-13 MED ORDER — HEPARIN SOD (PORK) LOCK FLUSH 100 UNIT/ML IV SOLN
500.0000 [IU] | Freq: Once | INTRAVENOUS | Status: AC | PRN
  Administered 2016-07-13: 500 [IU]

## 2016-07-13 MED ORDER — SODIUM CHLORIDE 0.9 % IV SOLN
240.0000 mg | Freq: Once | INTRAVENOUS | Status: AC
  Administered 2016-07-13: 240 mg via INTRAVENOUS
  Filled 2016-07-13: qty 4

## 2016-07-13 MED ORDER — SODIUM CHLORIDE 0.9 % IV SOLN
Freq: Once | INTRAVENOUS | Status: AC
  Administered 2016-07-13: 13:00:00 via INTRAVENOUS

## 2016-07-13 NOTE — Progress Notes (Signed)
Tolerated opdivo infusion well. Ambulatory on discharge home to self.

## 2016-07-13 NOTE — Patient Instructions (Signed)
[facility] Discharge Instructions for Patients Receiving Chemotherapy   Beginning January 23rd 2017 lab work for the Cancer Center will be done in the  Main lab at [facility] on 1st floor. If you have a lab appointment with the Cancer Center please come in thru the  Main Entrance and check in at the main information desk   Today you received the following chemotherapy agents opdivo.  If you develop nausea and vomiting, or diarrhea that is not controlled by your medication, call the clinic.  The clinic phone number is [phone]. Office hours are Monday-Friday 8:30am-5:00pm.  BELOW ARE SYMPTOMS THAT SHOULD BE REPORTED IMMEDIATELY:  *FEVER GREATER THAN 101.0 F  *CHILLS WITH OR WITHOUT FEVER  NAUSEA AND VOMITING THAT IS NOT CONTROLLED WITH YOUR NAUSEA MEDICATION  *UNUSUAL SHORTNESS OF BREATH  *UNUSUAL BRUISING OR BLEEDING  TENDERNESS IN MOUTH AND THROAT WITH OR WITHOUT PRESENCE OF ULCERS  *URINARY PROBLEMS  *BOWEL PROBLEMS  UNUSUAL RASH Items with * indicate a potential emergency and should be followed up as soon as possible. If you have an emergency after office hours please contact your primary care physician or go to the nearest emergency department.  Please call the clinic during office hours if you have any questions or concerns.   You may also contact the Patient Navigator at [phone] should you have any questions or need assistance in obtaining follow up care.      Resources For Cancer Patients and their Caregivers ? American Cancer Society: Can assist with transportation, wigs, general needs, runs Look Good Feel Better.        1-888-227-6333 ? Cancer Care: Provides financial assistance, online support groups, medication/co-pay assistance.  1-800-813-HOPE (4673) ? [facility] Assists [place] cancer patients and their families through emotional , educational and financial support.  336-427-4357 ? [place] DSS Where to apply for food stamps, Medicaid and utility assistance. 336-342-1394 ? [facility]: Transportation to medical appointments. 336-347-2287 ? Social Security Administration: May apply for disability if have a Stage IV cancer. 336-342-7796 1-800-772-1213 ? [place] Aging, Disability and Transit Services: Assists with nutrition, care and transit needs. [phone]         

## 2016-07-27 ENCOUNTER — Encounter (HOSPITAL_BASED_OUTPATIENT_CLINIC_OR_DEPARTMENT_OTHER): Payer: 59 | Admitting: Oncology

## 2016-07-27 ENCOUNTER — Encounter (HOSPITAL_COMMUNITY): Payer: 59 | Attending: Hematology and Oncology

## 2016-07-27 ENCOUNTER — Encounter (HOSPITAL_COMMUNITY): Payer: Self-pay | Admitting: Oncology

## 2016-07-27 VITALS — BP 107/64 | HR 84 | Temp 98.4°F | Resp 16 | Ht 59.0 in | Wt 89.0 lb

## 2016-07-27 VITALS — BP 114/71 | HR 78 | Temp 98.1°F | Resp 16

## 2016-07-27 DIAGNOSIS — J449 Chronic obstructive pulmonary disease, unspecified: Secondary | ICD-10-CM | POA: Insufficient documentation

## 2016-07-27 DIAGNOSIS — C772 Secondary and unspecified malignant neoplasm of intra-abdominal lymph nodes: Secondary | ICD-10-CM | POA: Diagnosis not present

## 2016-07-27 DIAGNOSIS — C7972 Secondary malignant neoplasm of left adrenal gland: Secondary | ICD-10-CM | POA: Insufficient documentation

## 2016-07-27 DIAGNOSIS — K219 Gastro-esophageal reflux disease without esophagitis: Secondary | ICD-10-CM | POA: Insufficient documentation

## 2016-07-27 DIAGNOSIS — F17210 Nicotine dependence, cigarettes, uncomplicated: Secondary | ICD-10-CM | POA: Diagnosis not present

## 2016-07-27 DIAGNOSIS — K068 Other specified disorders of gingiva and edentulous alveolar ridge: Secondary | ICD-10-CM

## 2016-07-27 DIAGNOSIS — C7989 Secondary malignant neoplasm of other specified sites: Secondary | ICD-10-CM | POA: Insufficient documentation

## 2016-07-27 DIAGNOSIS — C3411 Malignant neoplasm of upper lobe, right bronchus or lung: Secondary | ICD-10-CM

## 2016-07-27 DIAGNOSIS — C7951 Secondary malignant neoplasm of bone: Secondary | ICD-10-CM

## 2016-07-27 DIAGNOSIS — E785 Hyperlipidemia, unspecified: Secondary | ICD-10-CM | POA: Diagnosis not present

## 2016-07-27 DIAGNOSIS — Z5112 Encounter for antineoplastic immunotherapy: Secondary | ICD-10-CM

## 2016-07-27 DIAGNOSIS — I10 Essential (primary) hypertension: Secondary | ICD-10-CM | POA: Insufficient documentation

## 2016-07-27 DIAGNOSIS — C781 Secondary malignant neoplasm of mediastinum: Secondary | ICD-10-CM | POA: Diagnosis not present

## 2016-07-27 DIAGNOSIS — C7971 Secondary malignant neoplasm of right adrenal gland: Secondary | ICD-10-CM | POA: Diagnosis not present

## 2016-07-27 DIAGNOSIS — Z9089 Acquired absence of other organs: Secondary | ICD-10-CM | POA: Insufficient documentation

## 2016-07-27 DIAGNOSIS — Z79899 Other long term (current) drug therapy: Secondary | ICD-10-CM | POA: Insufficient documentation

## 2016-07-27 DIAGNOSIS — E278 Other specified disorders of adrenal gland: Secondary | ICD-10-CM

## 2016-07-27 LAB — CBC WITH DIFFERENTIAL/PLATELET
Basophils Absolute: 0 10*3/uL (ref 0.0–0.1)
Basophils Relative: 0 %
Eosinophils Absolute: 0.1 10*3/uL (ref 0.0–0.7)
Eosinophils Relative: 2 %
HEMATOCRIT: 33.5 % — AB (ref 36.0–46.0)
Hemoglobin: 11 g/dL — ABNORMAL LOW (ref 12.0–15.0)
LYMPHS PCT: 22 %
Lymphs Abs: 1.9 10*3/uL (ref 0.7–4.0)
MCH: 30.2 pg (ref 26.0–34.0)
MCHC: 32.8 g/dL (ref 30.0–36.0)
MCV: 92 fL (ref 78.0–100.0)
MONO ABS: 0.6 10*3/uL (ref 0.1–1.0)
MONOS PCT: 8 %
NEUTROS ABS: 5.7 10*3/uL (ref 1.7–7.7)
Neutrophils Relative %: 68 %
Platelets: 282 10*3/uL (ref 150–400)
RBC: 3.64 MIL/uL — ABNORMAL LOW (ref 3.87–5.11)
RDW: 15.4 % (ref 11.5–15.5)
WBC: 8.3 10*3/uL (ref 4.0–10.5)

## 2016-07-27 LAB — COMPREHENSIVE METABOLIC PANEL
ALT: 8 U/L — ABNORMAL LOW (ref 14–54)
ANION GAP: 9 (ref 5–15)
AST: 18 U/L (ref 15–41)
Albumin: 4.1 g/dL (ref 3.5–5.0)
Alkaline Phosphatase: 62 U/L (ref 38–126)
BILIRUBIN TOTAL: 0.1 mg/dL — AB (ref 0.3–1.2)
BUN: 12 mg/dL (ref 6–20)
CALCIUM: 10.4 mg/dL — AB (ref 8.9–10.3)
CO2: 24 mmol/L (ref 22–32)
Chloride: 101 mmol/L (ref 101–111)
Creatinine, Ser: 1.51 mg/dL — ABNORMAL HIGH (ref 0.44–1.00)
GFR calc Af Amer: 43 mL/min — ABNORMAL LOW (ref >60)
GFR, EST NON AFRICAN AMERICAN: 37 mL/min — AB (ref >60)
Glucose, Bld: 117 mg/dL — ABNORMAL HIGH (ref 65–99)
POTASSIUM: 3.5 mmol/L (ref 3.5–5.1)
Sodium: 134 mmol/L — ABNORMAL LOW (ref 135–145)
TOTAL PROTEIN: 7.9 g/dL (ref 6.5–8.1)

## 2016-07-27 MED ORDER — NIVOLUMAB CHEMO INJECTION 100 MG/10ML
240.0000 mg | Freq: Once | INTRAVENOUS | Status: AC
  Administered 2016-07-27: 240 mg via INTRAVENOUS
  Filled 2016-07-27: qty 20

## 2016-07-27 MED ORDER — HEPARIN SOD (PORK) LOCK FLUSH 100 UNIT/ML IV SOLN
INTRAVENOUS | Status: AC
  Filled 2016-07-27: qty 5

## 2016-07-27 MED ORDER — SODIUM CHLORIDE 0.9 % IJ SOLN
10.0000 mL | INTRAMUSCULAR | Status: DC | PRN
  Administered 2016-07-27: 10 mL
  Filled 2016-07-27: qty 10

## 2016-07-27 MED ORDER — SODIUM CHLORIDE 0.9 % IV SOLN
Freq: Once | INTRAVENOUS | Status: AC
  Administered 2016-07-27: 11:00:00 via INTRAVENOUS

## 2016-07-27 MED ORDER — HEPARIN SOD (PORK) LOCK FLUSH 100 UNIT/ML IV SOLN
500.0000 [IU] | Freq: Once | INTRAVENOUS | Status: AC | PRN
  Administered 2016-07-27: 500 [IU]

## 2016-07-27 NOTE — Assessment & Plan Note (Addendum)
Stage IV right adenocarcinoma pancoast tumor of the lung (EGFR and ALK negative) complicated by right brachial plexus neuralgia.  S/P XRT.  Currently on Nivolumab salvage therapy with Zometa for bone metastasis.  Oncology history is updated.  Pre-treatment labs today.  I personally reviewed and went over laboratory results with the patient.  The results are noted within this dictation. Pre-chemotherapy labs today satisfy treatment criteria.  TSH has been monitored and WNL most recently.  Mild hypercalcemia is noted with normal albumin.  ZOMETA is due today. BUT, she complains of upper jaw bone exposure that is new.  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  She is advised to follow-up with her dentist within the next 2 weeks.  Will HOLD and DISCONTINUE ZOMETA supportive therapy plan.  Continue with Nivolumab every 2 weeks.  Zometa is every 4 weeks.  CT CAP with contrast in ~3 weeks.  Return in 4 weeks for follow-up.

## 2016-07-27 NOTE — Progress Notes (Signed)
Will hold Zometa today per Dr. Whitney Muse. Patient will be having dental work done soon.   Patient received Opdivo today per orders. Patient tolerated chemo well, vitals stable. Patient discharged ambulatory with family at side.

## 2016-07-27 NOTE — Patient Instructions (Signed)
[facility] Discharge Instructions for Patients Receiving Chemotherapy   Beginning January 23rd 2017 lab work for the Cancer Center will be done in the  Main lab at [place] on 1st floor. If you have a lab appointment with the Cancer Center please come in thru the  Main Entrance and check in at the main information desk   Today you received the following chemotherapy agents Opdivo  To help prevent nausea and vomiting after your treatment, we encourage you to take your nausea medication   If you develop nausea and vomiting, or diarrhea that is not controlled by your medication, call the clinic.  The clinic phone number is [phone]. Office hours are Monday-Friday 8:30am-5:00pm.  BELOW ARE SYMPTOMS THAT SHOULD BE REPORTED IMMEDIATELY:  *FEVER GREATER THAN 101.0 F  *CHILLS WITH OR WITHOUT FEVER  NAUSEA AND VOMITING THAT IS NOT CONTROLLED WITH YOUR NAUSEA MEDICATION  *UNUSUAL SHORTNESS OF BREATH  *UNUSUAL BRUISING OR BLEEDING  TENDERNESS IN MOUTH AND THROAT WITH OR WITHOUT PRESENCE OF ULCERS  *URINARY PROBLEMS  *BOWEL PROBLEMS  UNUSUAL RASH Items with * indicate a potential emergency and should be followed up as soon as possible. If you have an emergency after office hours please contact your primary care physician or go to the nearest emergency department.  Please call the clinic during office hours if you have any questions or concerns.   You may also contact the Patient Navigator at [phone] should you have any questions or need assistance in obtaining follow up care.      Resources For Cancer Patients and their Caregivers ? American Cancer Society: Can assist with transportation, wigs, general needs, runs Look Good Feel Better.        1-888-227-6333 ? Cancer Care: Provides financial assistance, online support groups, medication/co-pay assistance.  1-800-813-HOPE (4673) ? [facility] Assists [place] cancer  patients and their families through emotional , educational and financial support.  336-427-4357 ? [place] DSS Where to apply for food stamps, Medicaid and utility assistance. 336-342-1394 ? RCATS: Transportation to medical appointments. 336-347-2287 ? Social Security Administration: May apply for disability if have a Stage IV cancer. 336-342-7796 1-800-772-1213 ? [place] Aging, Disability and Transit Services: Assists with nutrition, care and transit needs. 336-349-2343          

## 2016-07-27 NOTE — Patient Instructions (Signed)
[facility] Discharge Instructions  RECOMMENDATIONS MADE BY THE CONSULTANT AND ANY TEST RESULTS WILL BE SENT TO YOUR REFERRING PHYSICIAN.   You were seen today by Kirby Crigler PA-C. No more Zometa. You need to see your dentist soon.  Treatment in 2 weeks. CT CAP in 3 weeks.  Follow up with Dr. Whitney Muse in 4 weeks.    Thank you for choosing [facility] at [facility] to provide your oncology and hematology care.  To afford each patient quality time with our provider, please arrive at least 15 minutes before your scheduled appointment time.   Beginning January 23rd 2017 lab work for the [facility] will be done in the  Main lab at [facility] on 1st floor. If you have a lab appointment with the [facility] please come in thru the  Main Entrance and check in at the main information desk  You need to re-schedule your appointment should you arrive 10 or more minutes late.  We strive to give you quality time with our providers, and arriving late affects you and other patients whose appointments are after yours.  Also, if you no show three or more times for appointments you may be dismissed from the clinic at the providers discretion.     Again, thank you for choosing [facility].  Our hope is that these requests will decrease the amount of time that you wait before being seen by our physicians.       _____________________________________________________________  Should you have questions after your visit to [facility], please contact our office at [phone] between the hours of 8:30 a.m. and 4:30 p.m.  Voicemails left after 4:30 p.m. will not be returned until the following business day.  For prescription refill requests, have your pharmacy contact our office.         Resources For Cancer Patients and their Caregivers ? American Cancer Society: Can assist with transportation, wigs, general needs,  runs Look Good Feel Better.        (832)212-4641 ? Cancer Care: Provides financial assistance, online support groups, medication/co-pay assistance.  1-800-813-HOPE 2768558679) ? [facility] Assists [place] cancer patients and their families through emotional , educational and financial support.  (240)789-7864 ? [place] DSS Where to apply for food stamps, Medicaid and utility assistance. 5618624159 ? RCATS: Transportation to medical appointments. 980-371-3180 ? Social Security Administration: May apply for disability if have a Stage IV cancer. (360)597-1052 351-277-1278 ? [employer], Disability and Transit Services: Assists with nutrition, care and transit needs. [place] Support Programs: '@10RELATIVEDAYS'$ @ > Cancer Support Group  2nd Tuesday of the month 1pm-2pm, Journey Room  > Creative Journey  3rd Tuesday of the month 1130am-1pm, Journey Room  > Look Good Feel Better  1st Wednesday of the month 10am-12 noon, Journey Room (Call [place] to register [phone])

## 2016-07-27 NOTE — Progress Notes (Signed)
[name], MD [address] [place] [address]  Cancer of upper lobe of right lung [facility]) - Plan: CT Abdomen W Contrast, CT Chest W Contrast  Bone metastases ([place]) - Plan: CT Abdomen W Contrast, CT Chest W Contrast  CURRENT THERAPY:  Nivolumab every 2 weeks.Zometa every 4 weeks.  INTERVAL HISTORY: Melissa Sullivan 60 y.o. 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     Cancer of upper lobe of right lung ([place])   07/28/2014 Imaging    CT chest: Large R apical mass consistent with malignancy. This is destroying the R 2nd rib with extension into adjacent soft tissue. R hilar adenopathy with R 5cm adrenal metastatic lesion.      08/01/2014 Initial Biopsy    Lung, needle/core biopsy(ies), right upper lobe - POORLY DIFFERENTIATED ADENOCARCINOMA, SEE COMMENT.      08/08/2014 PET scan    Large hypermetabolic R apical mass with evidence of direct chest wall and mediastinal invasion, right retrocrural lymphadenopathy, extensive retroperitoneal lymphadenopathy, and metastatic lesions to the adrenal glands       09/02/2014 - 11/04/2014 Chemotherapy    Cisplatin/Pemetrexed/Avastin every 21 days x 4 cycles      10/07/2014 - 10/27/2014 Radiation Therapy    Right lung apex for control of brachioplexopathy.      12/24/2014 - 02/25/2015 Chemotherapy    Alimta/Avastin every 21 days.      02/20/2015 Imaging    Increase in size of right adrenal metastasis and subjacent confluent retrocaval lymphadenopathy      02/25/2015 -  Chemotherapy    Nivolumab, zometa      05/04/2015 Imaging    CT CAP- Stable to slight decrease in the posterior right apical lesion. Stable appearance of posterior right upper rib an upper thoracic bony lesions. Slight improvement in right upper lobe tree-in-bud opacity. No new or progressive findings in...      07/28/2015 Imaging    CT CAP-  Reduced size of the right apical pleural parenchymal lesion and reduced size of the right adrenal metastatic lesion. Resolution of prior retrocrural adenopathy.  Right eccentric T1 and T2 sclerosis with sclerosis and tapering of the right second..      11/17/2015 Imaging    CT CAP- Stable soft tissue thickening in the apex of the right hemi thorax. Stable right adrenal metastasis. Nodularity along the trachea and mainstem bronchi, relatively new from 07/28/2015, favoring adherent debris.      11/18/2015 Treatment Plan Change    Zometa HELD for upcoming tooth extraction      11/24/2015 Treatment Plan Change    Zometa on hold at this time in preparation for tooth extraction in March 2017.  Zometa las given on 11/18/2015.      02/03/2016 Imaging    CT CAP- Heterogeneous right apical masslike consolidation and right adrenal metastasis are unchanged      04/06/2016 Treatment Plan Change    Zometa restarted 6 weeks out from tooth extraction (04/06/2016)      05/12/2016 Imaging    CT CAP- NED in the chest, abdomen or pelvis.  Some areas of nodularity associated with the mainstem bronchi in the left upper lobe bronchus, favored to represent adherent inspissated secretions      She is doing well.  She notes that her right upper gum line has 2 exposed bone areas.  She reports that she has  a follow-up dental appointment in November.  Otherwise, she denies any complaints.  Review of Systems  Constitutional: Negative.  Negative for chills, fever and weight loss.  HENT: Negative.   Eyes: Negative.   Respiratory: Negative.   Cardiovascular: Negative.   Gastrointestinal: Negative.   Genitourinary: Negative.   Musculoskeletal: Negative.   Skin: Negative.   Neurological: Negative.  Negative for weakness.  Endo/Heme/Allergies: Negative.   Psychiatric/Behavioral: Negative.     Past Medical History:  Diagnosis Date  . Adrenal mass, right ([place]) 07/28/2014  . Bone metastases ([place]) 04/05/2016  .  Cancer ([place])    lung  right  . Diabetes mellitus without complication ([place])   . Hyperlipidemia   . Hypertension   . Lung mass 07/28/2014  . Reflux     Past Surgical History:  Procedure Laterality Date  . APPENDECTOMY    . ESOPHAGOGASTRODUODENOSCOPY N/A 12/09/2014   TLX:BWIOMB esophageal stricture/mild-to-noderate erosive gastritis. negative H.pylori  . FLEXIBLE SIGMOIDOSCOPY  2011   Dr. [name]: hyperplastic polyp  . LUNG BIOPSY Right 07/2014   CT guided  . MALONEY DILATION N/A 12/09/2014   Procedure: [name] DILATION;  Surgeon: [name], MD;  Location: [facility];  Service: Endoscopy;  Laterality: N/A;  . PORTACATH PLACEMENT Left 09/01/2014  . SAVORY DILATION N/A 12/09/2014   Procedure: SAVORY DILATION;  Surgeon: [name], MD;  Location: [facility] SUITE;  Service: Endoscopy;  Laterality: N/A;    Family History  Problem Relation Age of Onset  . Cancer Sister     Social History   Social History  . Marital status: Married    Spouse name: N/A  . Number of children: N/A  . Years of education: N/A   Social History Main Topics  . Smoking status: Light Tobacco Smoker    Packs/day: 0.30    Types: Cigarettes  . Smokeless tobacco: Never Used  . Alcohol use No  . Drug use: No  . Sexual activity: Not Asked   Other Topics Concern  . None   Social History Narrative  . None     PHYSICAL EXAMINATION  ECOG PERFORMANCE STATUS: 1 - Symptomatic but completely ambulatory  Vitals:   07/27/16 0952  BP: 107/64  Pulse: 84  Resp: 16  Temp: 98.4 F (36.9 C)    GENERAL:alert, well nourished, well developed, comfortable, cooperative, smiling and accompanied by daughter. SKIN: skin color, texture, turgor are normal, no rashes or significant lesions HEAD: Normocephalic, No masses, lesions, tenderness or abnormalities EYES: normal, EOMI, Conjunctiva are pink and non-injected EARS: External ears normal OROPHARYNX:no exudate, no erythema, lips, buccal mucosa, and tongue  normal and exposed bone on right anterior upper jaw NECK: supple, trachea midline LYMPH:  no palpable lymphadenopathy BREAST:not examined LUNGS: clear to auscultation and percussion HEART: regular rate & rhythm, no murmurs, no gallops, S1 normal and S2 normal ABDOMEN:abdomen soft, non-tender and normal bowel sounds BACK: Back symmetric, no curvature. EXTREMITIES:less then 2 second capillary refill, no joint deformities, effusion, or inflammation, no skin discoloration, no cyanosis  NEURO: alert & oriented x 3 with fluent speech, no focal motor/sensory deficits, gait normal   LABORATORY DATA: CBC    Component Value Date/Time   WBC 8.3 07/27/2016 1001   RBC 3.64 (L) 07/27/2016 1001   HGB 11.0 (L) 07/27/2016 1001   HCT 33.5 (L) 07/27/2016 1001   PLT 282 07/27/2016 1001   MCV 92.0 07/27/2016 1001   MCH 30.2 07/27/2016 1001   MCHC 32.8 07/27/2016 1001   RDW 15.4 07/27/2016  1001   LYMPHSABS 1.9 07/27/2016 1001   MONOABS 0.6 07/27/2016 1001   EOSABS 0.1 07/27/2016 1001   BASOSABS 0.0 07/27/2016 1001      Chemistry      Component Value Date/Time   NA 134 (L) 07/27/2016 1001   K 3.5 07/27/2016 1001   CL 101 07/27/2016 1001   CO2 24 07/27/2016 1001   BUN 12 07/27/2016 1001   CREATININE 1.51 (H) 07/27/2016 1001      Component Value Date/Time   CALCIUM 10.4 (H) 07/27/2016 1001   CALCIUM 9.7 06/03/2015 1000   ALKPHOS 62 07/27/2016 1001   AST 18 07/27/2016 1001   ALT 8 (L) 07/27/2016 1001   BILITOT 0.1 (L) 07/27/2016 1001        PENDING LABS:   RADIOGRAPHIC STUDIES:  No results found.   PATHOLOGY:    ASSESSMENT AND PLAN:  Cancer of upper lobe of right lung Stage IV right adenocarcinoma pancoast tumor of the lung (EGFR and ALK negative) complicated by right brachial plexus neuralgia.  S/P XRT.  Currently on Nivolumab salvage therapy with Zometa for bone metastasis.  Oncology history is updated.  Pre-treatment labs today.  I personally reviewed and went over  laboratory results with the patient.  The results are noted within this dictation. Pre-chemotherapy labs today satisfy treatment criteria.  TSH has been monitored and WNL most recently.  Mild hypercalcemia is noted with normal albumin.  ZOMETA is due today. BUT, she complains of upper jaw bone exposure that is new.  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  She is advised to follow-up with her dentist within the next 2 weeks.  Will HOLD and DISCONTINUE ZOMETA supportive therapy plan.  Continue with Nivolumab every 2 weeks.  Zometa is every 4 weeks.  CT CAP with contrast in ~3 weeks.  Return in 4 weeks for follow-up.  Bone metastases ([place]) Bone metastases on original staging PET scan.    Zometa every 4 weeks.    ORDERS PLACED FOR THIS ENCOUNTER: Orders Placed This Encounter  Procedures  . CT Abdomen W Contrast  . CT Chest W Contrast    MEDICATIONS PRESCRIBED THIS ENCOUNTER: No orders of the defined types were placed in this encounter.   THERAPY PLAN:  Continue treatment as planned.   All questions were answered. The patient knows to call the clinic with any problems, questions or concerns. We can certainly see the patient much sooner if necessary.  Patient and plan discussed with Dr. Ancil Linsey and she is in agreement with the aforementioned.   This note is electronically signed by: Doy Mince 07/27/2016 6:31 PM

## 2016-07-27 NOTE — Assessment & Plan Note (Signed)
Bone metastases on original staging PET scan.    Zometa every 4 weeks.

## 2016-08-10 ENCOUNTER — Encounter (HOSPITAL_BASED_OUTPATIENT_CLINIC_OR_DEPARTMENT_OTHER): Payer: 59

## 2016-08-10 ENCOUNTER — Inpatient Hospital Stay (HOSPITAL_COMMUNITY): Payer: 59

## 2016-08-10 VITALS — BP 95/61 | HR 77 | Temp 98.8°F | Resp 16 | Wt 90.2 lb

## 2016-08-10 DIAGNOSIS — C7951 Secondary malignant neoplasm of bone: Secondary | ICD-10-CM

## 2016-08-10 DIAGNOSIS — C7971 Secondary malignant neoplasm of right adrenal gland: Secondary | ICD-10-CM | POA: Diagnosis not present

## 2016-08-10 DIAGNOSIS — Z23 Encounter for immunization: Secondary | ICD-10-CM | POA: Diagnosis not present

## 2016-08-10 DIAGNOSIS — C3411 Malignant neoplasm of upper lobe, right bronchus or lung: Secondary | ICD-10-CM

## 2016-08-10 DIAGNOSIS — Z5112 Encounter for antineoplastic immunotherapy: Secondary | ICD-10-CM | POA: Diagnosis not present

## 2016-08-10 DIAGNOSIS — E278 Other specified disorders of adrenal gland: Secondary | ICD-10-CM

## 2016-08-10 LAB — CBC WITH DIFFERENTIAL/PLATELET
BASOS ABS: 0 10*3/uL (ref 0.0–0.1)
Basophils Relative: 0 %
EOS PCT: 2 %
Eosinophils Absolute: 0.1 10*3/uL (ref 0.0–0.7)
HCT: 31.5 % — ABNORMAL LOW (ref 36.0–46.0)
Hemoglobin: 10.4 g/dL — ABNORMAL LOW (ref 12.0–15.0)
LYMPHS ABS: 2 10*3/uL (ref 0.7–4.0)
LYMPHS PCT: 25 %
MCH: 29.9 pg (ref 26.0–34.0)
MCHC: 33 g/dL (ref 30.0–36.0)
MCV: 90.5 fL (ref 78.0–100.0)
MONO ABS: 0.6 10*3/uL (ref 0.1–1.0)
Monocytes Relative: 7 %
Neutro Abs: 5.2 10*3/uL (ref 1.7–7.7)
Neutrophils Relative %: 66 %
PLATELETS: 315 10*3/uL (ref 150–400)
RBC: 3.48 MIL/uL — ABNORMAL LOW (ref 3.87–5.11)
RDW: 15.7 % — AB (ref 11.5–15.5)
WBC: 8 10*3/uL (ref 4.0–10.5)

## 2016-08-10 LAB — COMPREHENSIVE METABOLIC PANEL
ALT: 6 U/L — ABNORMAL LOW (ref 14–54)
AST: 13 U/L — ABNORMAL LOW (ref 15–41)
Albumin: 3.9 g/dL (ref 3.5–5.0)
Alkaline Phosphatase: 60 U/L (ref 38–126)
Anion gap: 6 (ref 5–15)
BUN: 9 mg/dL (ref 6–20)
CHLORIDE: 106 mmol/L (ref 101–111)
CO2: 23 mmol/L (ref 22–32)
Calcium: 9.6 mg/dL (ref 8.9–10.3)
Creatinine, Ser: 1.6 mg/dL — ABNORMAL HIGH (ref 0.44–1.00)
GFR, EST AFRICAN AMERICAN: 39 mL/min — AB (ref >60)
GFR, EST NON AFRICAN AMERICAN: 34 mL/min — AB (ref >60)
Glucose, Bld: 81 mg/dL (ref 65–99)
POTASSIUM: 3.9 mmol/L (ref 3.5–5.1)
Sodium: 135 mmol/L (ref 135–145)
Total Bilirubin: 0.2 mg/dL — ABNORMAL LOW (ref 0.3–1.2)
Total Protein: 7.8 g/dL (ref 6.5–8.1)

## 2016-08-10 LAB — TSH: TSH: 1.854 u[IU]/mL (ref 0.350–4.500)

## 2016-08-10 MED ORDER — SODIUM CHLORIDE 0.9 % IJ SOLN: 10.0000 mL | INTRAMUSCULAR | Status: DC | PRN

## 2016-08-10 MED ORDER — HEPARIN SOD (PORK) LOCK FLUSH 100 UNIT/ML IV SOLN
500.0000 [IU] | Freq: Once | INTRAVENOUS | Status: AC | PRN
  Administered 2016-08-10: 500 [IU]
  Filled 2016-08-10: qty 5

## 2016-08-10 MED ORDER — SODIUM CHLORIDE 0.9 % IV SOLN
Freq: Once | INTRAVENOUS | Status: AC
  Administered 2016-08-10: 12:00:00 via INTRAVENOUS

## 2016-08-10 MED ORDER — SODIUM CHLORIDE 0.9 % IV SOLN
240.0000 mg | Freq: Once | INTRAVENOUS | Status: AC
  Administered 2016-08-10: 240 mg via INTRAVENOUS
  Filled 2016-08-10: qty 20

## 2016-08-10 MED ORDER — INFLUENZA VAC SPLIT QUAD 0.5 ML IM SUSY
0.5000 mL | PREFILLED_SYRINGE | Freq: Once | INTRAMUSCULAR | Status: AC
  Administered 2016-08-10: 0.5 mL via INTRAMUSCULAR
  Filled 2016-08-10: qty 0.5

## 2016-08-10 NOTE — Progress Notes (Signed)
Patient tolerated infusion well.  VSS.  Patient ambulatory and stable upon discharge.

## 2016-08-10 NOTE — Patient Instructions (Signed)
[facility] Discharge Instructions for Patients Receiving Chemotherapy   Beginning January 23rd 2017 lab work for the [facility] will be done in the  Main lab at [facility] on 1st floor. If you have a lab appointment with the [facility] please come in thru the  Main Entrance and check in at the main information desk   Today you received the following chemotherapy agent: Opdivo. You also received your flu vaccination today.     If you develop nausea and vomiting, or diarrhea that is not controlled by your medication, call the clinic.  The clinic phone number is [phone]. Office hours are Monday-Friday 8:30am-5:00pm.  BELOW ARE SYMPTOMS THAT SHOULD BE REPORTED IMMEDIATELY:  *FEVER GREATER THAN 101.0 F  *CHILLS WITH OR WITHOUT FEVER  NAUSEA AND VOMITING THAT IS NOT CONTROLLED WITH YOUR NAUSEA MEDICATION  *UNUSUAL SHORTNESS OF BREATH  *UNUSUAL BRUISING OR BLEEDING  TENDERNESS IN MOUTH AND THROAT WITH OR WITHOUT PRESENCE OF ULCERS  *URINARY PROBLEMS  *BOWEL PROBLEMS  UNUSUAL RASH Items with * indicate a potential emergency and should be followed up as soon as possible. If you have an emergency after office hours please contact your primary care physician or go to the nearest emergency department.  Please call the clinic during office hours if you have any questions or concerns.   You may also contact the Patient Navigator at [phone] should you have any questions or need assistance in obtaining follow up care.      Resources For Cancer Patients and their Caregivers ? American Cancer Society: Can assist with transportation, wigs, general needs, runs Look Good Feel Better.        405-749-9734 ? Cancer Care: Provides financial assistance, online support groups, medication/co-pay assistance.  1-800-813-HOPE 573 478 7073) ? [facility] Assists [place] cancer patients and their families through emotional , educational  and financial support.  754-728-5899 ? [place] DSS Where to apply for food stamps, Medicaid and utility assistance. (561)259-1944 ? RCATS: Transportation to medical appointments. 716-494-9585 ? Social Security Administration: May apply for disability if have a Stage IV cancer. [phone] 4015349132 ? [employer], Disability and Transit Services: Assists with nutrition, care and transit needs. [phone]

## 2016-08-17 ENCOUNTER — Ambulatory Visit (HOSPITAL_COMMUNITY)
Admission: RE | Admit: 2016-08-17 | Discharge: 2016-08-17 | Disposition: A | Discharge status: Home / Self Care | Payer: 59 | Source: Ambulatory Visit | Attending: Oncology | Admitting: Oncology

## 2016-08-17 ENCOUNTER — Telehealth (HOSPITAL_COMMUNITY): Payer: Self-pay | Admitting: *Deleted

## 2016-08-17 DIAGNOSIS — J439 Emphysema, unspecified: Secondary | ICD-10-CM | POA: Insufficient documentation

## 2016-08-17 DIAGNOSIS — C3411 Malignant neoplasm of upper lobe, right bronchus or lung: Secondary | ICD-10-CM | POA: Insufficient documentation

## 2016-08-17 DIAGNOSIS — C7951 Secondary malignant neoplasm of bone: Secondary | ICD-10-CM | POA: Diagnosis not present

## 2016-08-17 DIAGNOSIS — I700 Atherosclerosis of aorta: Secondary | ICD-10-CM | POA: Diagnosis not present

## 2016-08-17 MED ORDER — IOPAMIDOL (ISOVUE-300) INJECTION 61%
75.0000 mL | Freq: Once | INTRAVENOUS | Status: AC | PRN
  Administered 2016-08-17: 75 mL via INTRAVENOUS

## 2016-08-17 NOTE — Telephone Encounter (Signed)
-----   Message from [facility], PA-C sent at 08/17/2016  4:19 PM EDT ----- Kermit Balo.  Let daughter know.

## 2016-08-17 NOTE — Telephone Encounter (Signed)
Daughter aware of CT results.

## 2016-08-22 NOTE — Progress Notes (Signed)
Melissa Neighbors, MD [address] [place] [address]  Cancer of upper lobe of right lung [facility]) - Plan: Oxycodone HCl 10 MG TABS  CURRENT THERAPY:  Nivolumab every 2 weeks.Zometa every 4 weeks.  INTERVAL HISTORY: Melissa Sullivan 60 y.o. 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     Cancer of upper lobe of right lung ([place])   07/28/2014 Imaging    CT chest: Large R apical mass consistent with malignancy. This is destroying the R 2nd rib with extension into adjacent soft tissue. R hilar adenopathy with R 5cm adrenal metastatic lesion.      08/01/2014 Initial Biopsy    Lung, needle/core biopsy(ies), right upper lobe - POORLY DIFFERENTIATED ADENOCARCINOMA, SEE COMMENT.      08/08/2014 PET scan    Large hypermetabolic R apical mass with evidence of direct chest wall and mediastinal invasion, right retrocrural lymphadenopathy, extensive retroperitoneal lymphadenopathy, and metastatic lesions to the adrenal glands       09/02/2014 - 11/04/2014 Chemotherapy    Cisplatin/Pemetrexed/Avastin every 21 days x 4 cycles      10/07/2014 - 10/27/2014 Radiation Therapy    Right lung apex for control of brachioplexopathy.      12/24/2014 - 02/25/2015 Chemotherapy    Alimta/Avastin every 21 days.      02/20/2015 Imaging    Increase in size of right adrenal metastasis and subjacent confluent retrocaval lymphadenopathy      02/25/2015 -  Chemotherapy    Nivolumab, zometa      05/04/2015 Imaging    CT CAP- Stable to slight decrease in the posterior right apical lesion. Stable appearance of posterior right upper rib an upper thoracic bony lesions. Slight improvement in right upper lobe tree-in-bud opacity. No new or progressive findings in...      07/28/2015 Imaging    CT CAP- Reduced size of the right apical pleural parenchymal lesion and reduced size of the right adrenal  metastatic lesion. Resolution of prior retrocrural adenopathy.  Right eccentric T1 and T2 sclerosis with sclerosis and tapering of the right second..      11/17/2015 Imaging    CT CAP- Stable soft tissue thickening in the apex of the right hemi thorax. Stable right adrenal metastasis. Nodularity along the trachea and mainstem bronchi, relatively new from 07/28/2015, favoring adherent debris.      11/18/2015 Treatment Plan Change    Zometa HELD for upcoming tooth extraction      11/24/2015 Treatment Plan Change    Zometa on hold at this time in preparation for tooth extraction in March 2017.  Zometa las given on 11/18/2015.      02/03/2016 Imaging    CT CAP- Heterogeneous right apical masslike consolidation and right adrenal metastasis are unchanged      04/06/2016 Treatment Plan Change    Zometa restarted 6 weeks out from tooth extraction (04/06/2016)      05/12/2016 Imaging    CT CAP- NED in the chest, abdomen or pelvis.  Some areas of nodularity associated with the mainstem bronchi in the left upper lobe bronchus, favored to represent adherent inspissated secretions      08/17/2016 Imaging    CT CAP- 1. Stable CTs of the chest and abdomen. No evidence of progressive metastatic disease. 2. Probable treated tumor at the right apex, right adrenal gland and T2 vertebral body, stable. 3. Fluctuating nodularity  along the walls of the trachea and mainstem bronchi, likely secretions.      She is doing well.  She denies any new complaints.  She continues to smoke, but significantly less.  She reports that she does not smoke a cigarette on some days.  She notes that her trigger for smoking is stress.  She did see her dentist who, she reports, removed some of the exposed bone recently.  She does not remember what he said about this issue.  She notes that it is much improved.  She does not remember her dentist's name either.    She otherwise is doing well without any complaints.  Review of  Systems  Constitutional: Negative.  Negative for chills, fever and weight loss.  HENT: Negative.   Eyes: Negative.  Negative for blurred vision and double vision.  Respiratory: Negative.  Negative for cough.   Cardiovascular: Negative.  Negative for chest pain.  Gastrointestinal: Negative.  Negative for nausea and vomiting.  Genitourinary: Negative.   Musculoskeletal: Negative.   Skin: Negative.  Negative for rash.  Neurological: Negative.  Negative for weakness and headaches.  Endo/Heme/Allergies: Negative.   Psychiatric/Behavioral: Negative.      Past Medical History:  Diagnosis Date   Adrenal mass, right ([place]) 07/28/2014   Bone metastases ([place]) 04/05/2016   Cancer ([place])    lung  right   Diabetes mellitus without complication ([place])    Hyperlipidemia    Hypertension    Lung mass 07/28/2014   Reflux     Past Surgical History:  Procedure Laterality Date   APPENDECTOMY     ESOPHAGOGASTRODUODENOSCOPY N/A 12/09/2014   TZG:YFVCBS esophageal stricture/mild-to-noderate erosive gastritis. negative H.pylori   FLEXIBLE SIGMOIDOSCOPY  2011   Dr. [name]: hyperplastic polyp   LUNG BIOPSY Right 07/2014   CT guided   MALONEY DILATION N/A 12/09/2014   Procedure: MALONEY DILATION;  Surgeon: [name], MD;  Location: [facility];  Service: Endoscopy;  Laterality: N/A;   PORTACATH PLACEMENT Left 09/01/2014   SAVORY DILATION N/A 12/09/2014   Procedure: SAVORY DILATION;  Surgeon: [name], MD;  Location: [facility];  Service: Endoscopy;  Laterality: N/A;    Family History  Problem Relation Age of Onset   Cancer Sister     Social History   Social History   Marital status: Married    Spouse name: N/A   Number of children: N/A   Years of education: N/A   Social History Main Topics   Smoking status: Light Tobacco Smoker    Packs/day: 0.30    Types: Cigarettes   Smokeless tobacco: Never Used   Alcohol use No   Drug use: No   Sexual activity: Not  Asked   Other Topics Concern   None   Social History Narrative   None     PHYSICAL EXAMINATION  ECOG PERFORMANCE STATUS: 1 - Symptomatic but completely ambulatory  There were no vitals filed for this visit.  Vitals - 1 value per visit 49/04/7590  SYSTOLIC 638  DIASTOLIC 69  Pulse 80  Temperature 99  Respirations 16  Weight (lb) 91    GENERAL:alert, well nourished, well developed, comfortable, cooperative, smiling and unaccompanied. SKIN: skin color, texture, turgor are normal, no rashes or significant lesions HEAD: Normocephalic, No masses, lesions, tenderness or abnormalities EYES: normal, EOMI, Conjunctiva are pink and non-injected EARS: External ears normal OROPHARYNX:no exudate, no erythema, lips, buccal mucosa, and tongue normal and exposed bone on right posterior upper gum line. NECK: supple, trachea  midline LYMPH:  no palpable lymphadenopathy BREAST:not examined LUNGS: clear to auscultation and percussion HEART: regular rate & rhythm, no murmurs, no gallops, S1 normal and S2 normal ABDOMEN:abdomen soft, non-tender and normal bowel sounds BACK: Back symmetric, no curvature. EXTREMITIES:less then 2 second capillary refill, no joint deformities, effusion, or inflammation, no skin discoloration, no cyanosis  NEURO: alert & oriented x 3 with fluent speech, no focal motor/sensory deficits, gait normal   LABORATORY DATA: CBC    Component Value Date/Time   WBC 10.7 (H) 08/24/2016 1003   RBC 3.63 (L) 08/24/2016 1003   HGB 11.0 (L) 08/24/2016 1003   HCT 33.3 (L) 08/24/2016 1003   PLT 359 08/24/2016 1003   MCV 91.7 08/24/2016 1003   MCH 30.3 08/24/2016 1003   MCHC 33.0 08/24/2016 1003   RDW 17.4 (H) 08/24/2016 1003   LYMPHSABS 3.1 08/24/2016 1003   MONOABS 0.8 08/24/2016 1003   EOSABS 0.2 08/24/2016 1003   BASOSABS 0.0 08/24/2016 1003      Chemistry      Component Value Date/Time   NA 137 08/24/2016 1003   K 4.2 08/24/2016 1003   CL 110 08/24/2016 1003    CO2 21 (L) 08/24/2016 1003   BUN 17 08/24/2016 1003   CREATININE 1.36 (H) 08/24/2016 1003      Component Value Date/Time   CALCIUM 9.6 08/24/2016 1003   CALCIUM 9.7 06/03/2015 1000   ALKPHOS 50 08/24/2016 1003   AST 15 08/24/2016 1003   ALT 13 (L) 08/24/2016 1003   BILITOT <0.1 (L) 08/24/2016 1003        PENDING LABS:   RADIOGRAPHIC STUDIES:  Ct Chest W Contrast  Result Date: 08/17/2016 CLINICAL DATA:  Right lung cancer post radiation therapy. Chemotherapy ongoing. EXAM: CT CHEST AND ABDOMEN WITH CONTRAST TECHNIQUE: Multidetector CT imaging of the chest and abdomen was performed following the standard protocol during bolus administration of intravenous contrast. CONTRAST:  18m ISOVUE-300 IOPAMIDOL (ISOVUE-300) INJECTION 61% COMPARISON:  CT 05/12/2016 and 02/03/2016. FINDINGS: CT CHEST FINDINGS Cardiovascular: Stable atherosclerosis of the aorta, great vessels and coronary arteries. There is a left IJ Port-A-Cath which terminates in the upper SVC. The heart size is normal. There is no pericardial effusion. Mediastinum/Nodes: There are no enlarged mediastinal, hilar or axillary lymph nodes. The thyroid gland, trachea and esophagus demonstrate no significant findings. Lungs/Pleura: There is no pleural effusion. The partially calcified, mass-like density at the right apex appears unchanged. This remains difficult to measure given its irregular shape, although measures approximately 3.6 x 2.8 cm on image 21. No new or enlarging pulmonary nodules are present. There is emphysema with scattered subpleural reticulation and mild bronchiectasis. Small areas of mucous plugging are present in both upper lobes. Fluctuating nodularity along the walls of the trachea and proximal bronchi are again noted, most consistent with adherent secretions. No fixed nodules are seen to suggest neoplasm. Musculoskeletal/Chest wall: Stable sclerosis of the T2 vertebral body, probably a treated metastasis. Stable sclerosis  of the upper right-sided ribs. No new lytic or blastic lesions are identified. No evidence of chest wall lesion. CT ABDOMEN FINDINGS Hepatobiliary: The liver is normal in density without focal abnormality. No evidence of gallstones, gallbladder wall thickening or biliary dilatation. Pancreas: Unremarkable. No pancreatic ductal dilatation or surrounding inflammatory changes. Spleen: Normal in size without focal abnormality. Adrenals/Urinary Tract: Complex, partially calcified mass involving the right adrenal gland is again noted. This has a thin neck extending posterior to the IVC. As measured in a similar fashion to the prior examination,  this appears unchanged, measuring approximately 3.7 x 2.4 cm on image 54. The left adrenal gland appears normal. Both kidneys appear normal. No evidence of renal mass, hydronephrosis or ureteral calculus. Bladder not imaged. Stomach/Bowel: Suggested wall thickening of the distal stomach, likely due to peristalsis. No surrounding abnormality. No evidence of bowel wall thickening, distention or surrounding inflammatory change. Vascular/Lymphatic: Stable aortic and branch vessel atherosclerosis. No enlarged abdominal pelvic lymph nodes. Other: No ascites or peritoneal nodularity. Stable small umbilical hernia containing only fat. Musculoskeletal: No acute or significant osseous findings. IMPRESSION: 1. Stable CTs of the chest and abdomen. No evidence of progressive metastatic disease. 2. Probable treated tumor at the right apex, right adrenal gland and T2 vertebral body, stable. 3. Fluctuating nodularity along the walls of the trachea and mainstem bronchi, likely secretions. 4.  Emphysema. (ICD10-J43.9) 5.  Aortic Atherosclerosis (ICD10-170.0) Electronically Signed   By: Richardean Sale M.D.   On: 08/17/2016 15:37   Ct Abdomen W Contrast  Result Date: 08/17/2016 CLINICAL DATA:  Right lung cancer post radiation therapy. Chemotherapy ongoing. EXAM: CT CHEST AND ABDOMEN WITH CONTRAST  TECHNIQUE: Multidetector CT imaging of the chest and abdomen was performed following the standard protocol during bolus administration of intravenous contrast. CONTRAST:  72m ISOVUE-300 IOPAMIDOL (ISOVUE-300) INJECTION 61% COMPARISON:  CT 05/12/2016 and 02/03/2016. FINDINGS: CT CHEST FINDINGS Cardiovascular: Stable atherosclerosis of the aorta, great vessels and coronary arteries. There is a left IJ Port-A-Cath which terminates in the upper SVC. The heart size is normal. There is no pericardial effusion. Mediastinum/Nodes: There are no enlarged mediastinal, hilar or axillary lymph nodes. The thyroid gland, trachea and esophagus demonstrate no significant findings. Lungs/Pleura: There is no pleural effusion. The partially calcified, mass-like density at the right apex appears unchanged. This remains difficult to measure given its irregular shape, although measures approximately 3.6 x 2.8 cm on image 21. No new or enlarging pulmonary nodules are present. There is emphysema with scattered subpleural reticulation and mild bronchiectasis. Small areas of mucous plugging are present in both upper lobes. Fluctuating nodularity along the walls of the trachea and proximal bronchi are again noted, most consistent with adherent secretions. No fixed nodules are seen to suggest neoplasm. Musculoskeletal/Chest wall: Stable sclerosis of the T2 vertebral body, probably a treated metastasis. Stable sclerosis of the upper right-sided ribs. No new lytic or blastic lesions are identified. No evidence of chest wall lesion. CT ABDOMEN FINDINGS Hepatobiliary: The liver is normal in density without focal abnormality. No evidence of gallstones, gallbladder wall thickening or biliary dilatation. Pancreas: Unremarkable. No pancreatic ductal dilatation or surrounding inflammatory changes. Spleen: Normal in size without focal abnormality. Adrenals/Urinary Tract: Complex, partially calcified mass involving the right adrenal gland is again noted.  This has a thin neck extending posterior to the IVC. As measured in a similar fashion to the prior examination, this appears unchanged, measuring approximately 3.7 x 2.4 cm on image 54. The left adrenal gland appears normal. Both kidneys appear normal. No evidence of renal mass, hydronephrosis or ureteral calculus. Bladder not imaged. Stomach/Bowel: Suggested wall thickening of the distal stomach, likely due to peristalsis. No surrounding abnormality. No evidence of bowel wall thickening, distention or surrounding inflammatory change. Vascular/Lymphatic: Stable aortic and branch vessel atherosclerosis. No enlarged abdominal pelvic lymph nodes. Other: No ascites or peritoneal nodularity. Stable small umbilical hernia containing only fat. Musculoskeletal: No acute or significant osseous findings. IMPRESSION: 1. Stable CTs of the chest and abdomen. No evidence of progressive metastatic disease. 2. Probable treated tumor at the right apex,  right adrenal gland and T2 vertebral body, stable. 3. Fluctuating nodularity along the walls of the trachea and mainstem bronchi, likely secretions. 4.  Emphysema. (ICD10-J43.9) 5.  Aortic Atherosclerosis (ICD10-170.0) Electronically Signed   By: Richardean Sale M.D.   On: 08/17/2016 15:37     PATHOLOGY:    ASSESSMENT AND PLAN:  Cancer of upper lobe of right lung Stage IV right adenocarcinoma pancoast tumor of the lung (EGFR and ALK negative) complicated by right brachial plexus neuralgia.  S/P XRT.  Currently on Nivolumab salvage therapy with Zometa for bone metastasis.  Oncology history is updated.  Pre-treatment labs today.  I personally reviewed and went over laboratory results with the patient.  The results are noted within this dictation. Pre-chemotherapy labs today satisfy treatment criteria.    I personally reviewed and went over radiographic studies with the patient.  The results are noted within this dictation.  CT imaging demonstrates stability of  disease.  ZOMETA is due, BUT, she recently saw a dentist in [place], [place] who removed some of her "jaw bone."  She does not recall the dentist's name.  She will call us later today or tomorrow with his name so we can request his assessment and plan regarding this issue.  She notes that her upper gums are much improved.  Still on open area on exam in the posterior upper gum line.  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  She is advised to continue to follow-up with her dentist .  Will HOLD and DISCONTINUE ZOMETA supportive therapy plan.  I have refilled her pain medication.  She notes that Dr. Nevada Crane has discontinued her antihypertensive and metformin.  Continue with Nivolumab every 2 weeks.  Zometa is every 4 weeks, but again, on HOLD.  Smoking cessation education is provided.  She does not smoke every day.  She notes that she smokes only under stress.  Return in 4 weeks for follow-up.   ORDERS PLACED FOR THIS ENCOUNTER: No orders of the defined types were placed in this encounter.   MEDICATIONS PRESCRIBED THIS ENCOUNTER: Meds ordered this encounter  Medications   Oxycodone HCl 10 MG TABS    Sig: Take 1-2 tablets (10-20 mg total) by mouth every 6 (six) hours as needed (pain.).    Dispense:  100 tablet    Refill:  0    Order Specific Question:   Supervising Provider    Answer:   Patrici Ranks [id number]    THERAPY PLAN:  Continue treatment as planned.  Zometa is discontinued.  I will try to get her dentist's thoughts regarding her mouth.  All questions were answered. The patient knows to call the clinic with any problems, questions or concerns. We can certainly see the patient much sooner if necessary.  Patient and plan discussed with Dr. Ancil Linsey and she is in agreement with the aforementioned.   This note is electronically signed by: Doy Mince 08/24/2016 6:05 PM

## 2016-08-24 ENCOUNTER — Encounter (HOSPITAL_COMMUNITY): Payer: 59 | Attending: Hematology and Oncology

## 2016-08-24 ENCOUNTER — Encounter (HOSPITAL_BASED_OUTPATIENT_CLINIC_OR_DEPARTMENT_OTHER): Payer: 59 | Admitting: Oncology

## 2016-08-24 ENCOUNTER — Encounter (HOSPITAL_COMMUNITY): Payer: Self-pay | Admitting: Oncology

## 2016-08-24 ENCOUNTER — Telehealth (HOSPITAL_COMMUNITY): Payer: Self-pay | Admitting: *Deleted

## 2016-08-24 VITALS — BP 121/69 | HR 80 | Temp 99.0°F | Resp 16 | Wt 91.0 lb

## 2016-08-24 DIAGNOSIS — K219 Gastro-esophageal reflux disease without esophagitis: Secondary | ICD-10-CM | POA: Insufficient documentation

## 2016-08-24 DIAGNOSIS — C7951 Secondary malignant neoplasm of bone: Secondary | ICD-10-CM | POA: Diagnosis not present

## 2016-08-24 DIAGNOSIS — Z720 Tobacco use: Secondary | ICD-10-CM

## 2016-08-24 DIAGNOSIS — E785 Hyperlipidemia, unspecified: Secondary | ICD-10-CM | POA: Insufficient documentation

## 2016-08-24 DIAGNOSIS — C772 Secondary and unspecified malignant neoplasm of intra-abdominal lymph nodes: Secondary | ICD-10-CM | POA: Insufficient documentation

## 2016-08-24 DIAGNOSIS — E278 Other specified disorders of adrenal gland: Secondary | ICD-10-CM

## 2016-08-24 DIAGNOSIS — F17210 Nicotine dependence, cigarettes, uncomplicated: Secondary | ICD-10-CM | POA: Diagnosis not present

## 2016-08-24 DIAGNOSIS — Z9089 Acquired absence of other organs: Secondary | ICD-10-CM | POA: Diagnosis not present

## 2016-08-24 DIAGNOSIS — C7972 Secondary malignant neoplasm of left adrenal gland: Secondary | ICD-10-CM | POA: Diagnosis not present

## 2016-08-24 DIAGNOSIS — C7971 Secondary malignant neoplasm of right adrenal gland: Secondary | ICD-10-CM | POA: Diagnosis not present

## 2016-08-24 DIAGNOSIS — I10 Essential (primary) hypertension: Secondary | ICD-10-CM | POA: Diagnosis not present

## 2016-08-24 DIAGNOSIS — Z79899 Other long term (current) drug therapy: Secondary | ICD-10-CM | POA: Insufficient documentation

## 2016-08-24 DIAGNOSIS — Z5112 Encounter for antineoplastic immunotherapy: Secondary | ICD-10-CM | POA: Diagnosis not present

## 2016-08-24 DIAGNOSIS — C3411 Malignant neoplasm of upper lobe, right bronchus or lung: Secondary | ICD-10-CM | POA: Insufficient documentation

## 2016-08-24 DIAGNOSIS — C7989 Secondary malignant neoplasm of other specified sites: Secondary | ICD-10-CM | POA: Insufficient documentation

## 2016-08-24 DIAGNOSIS — C781 Secondary malignant neoplasm of mediastinum: Secondary | ICD-10-CM | POA: Insufficient documentation

## 2016-08-24 DIAGNOSIS — J449 Chronic obstructive pulmonary disease, unspecified: Secondary | ICD-10-CM | POA: Insufficient documentation

## 2016-08-24 LAB — CBC WITH DIFFERENTIAL/PLATELET
BASOS ABS: 0 10*3/uL (ref 0.0–0.1)
Basophils Relative: 0 %
EOS PCT: 2 %
Eosinophils Absolute: 0.2 10*3/uL (ref 0.0–0.7)
HCT: 33.3 % — ABNORMAL LOW (ref 36.0–46.0)
Hemoglobin: 11 g/dL — ABNORMAL LOW (ref 12.0–15.0)
LYMPHS PCT: 29 %
Lymphs Abs: 3.1 10*3/uL (ref 0.7–4.0)
MCH: 30.3 pg (ref 26.0–34.0)
MCHC: 33 g/dL (ref 30.0–36.0)
MCV: 91.7 fL (ref 78.0–100.0)
MONO ABS: 0.8 10*3/uL (ref 0.1–1.0)
Monocytes Relative: 8 %
Neutro Abs: 6.6 10*3/uL (ref 1.7–7.7)
Neutrophils Relative %: 61 %
Platelets: 359 10*3/uL (ref 150–400)
RBC: 3.63 MIL/uL — ABNORMAL LOW (ref 3.87–5.11)
RDW: 17.4 % — ABNORMAL HIGH (ref 11.5–15.5)
WBC: 10.7 10*3/uL — ABNORMAL HIGH (ref 4.0–10.5)

## 2016-08-24 LAB — COMPREHENSIVE METABOLIC PANEL
ALK PHOS: 50 U/L (ref 38–126)
ALT: 13 U/L — AB (ref 14–54)
AST: 15 U/L (ref 15–41)
Albumin: 4.1 g/dL (ref 3.5–5.0)
Anion gap: 6 (ref 5–15)
BUN: 17 mg/dL (ref 6–20)
CALCIUM: 9.6 mg/dL (ref 8.9–10.3)
CO2: 21 mmol/L — AB (ref 22–32)
CREATININE: 1.36 mg/dL — AB (ref 0.44–1.00)
Chloride: 110 mmol/L (ref 101–111)
GFR calc non Af Amer: 41 mL/min — ABNORMAL LOW (ref >60)
GFR, EST AFRICAN AMERICAN: 48 mL/min — AB (ref >60)
GLUCOSE: 131 mg/dL — AB (ref 65–99)
Potassium: 4.2 mmol/L (ref 3.5–5.1)
SODIUM: 137 mmol/L (ref 135–145)
Total Bilirubin: <0.1 mg/dL — ABNORMAL LOW (ref 0.3–1.2)
Total Protein: 7.8 g/dL (ref 6.5–8.1)

## 2016-08-24 MED ORDER — SODIUM CHLORIDE 0.9 % IV SOLN
240.0000 mg | Freq: Once | INTRAVENOUS | Status: AC
  Administered 2016-08-24: 240 mg via INTRAVENOUS
  Filled 2016-08-24: qty 20

## 2016-08-24 MED ORDER — SODIUM CHLORIDE 0.9 % IJ SOLN
10.0000 mL | INTRAMUSCULAR | Status: DC | PRN
  Administered 2016-08-24: 10 mL
  Filled 2016-08-24: qty 10

## 2016-08-24 MED ORDER — OXYCODONE HCL 10 MG PO TABS: 10.0000 mg | ORAL_TABLET | Freq: Four times a day (QID) | ORAL | 0 refills | Status: DC | PRN

## 2016-08-24 MED ORDER — SODIUM CHLORIDE 0.9 % IV SOLN
Freq: Once | INTRAVENOUS | Status: AC
  Administered 2016-08-24: 11:00:00 via INTRAVENOUS

## 2016-08-24 MED ORDER — HEPARIN SOD (PORK) LOCK FLUSH 100 UNIT/ML IV SOLN
500.0000 [IU] | Freq: Once | INTRAVENOUS | Status: AC | PRN
  Administered 2016-08-24: 500 [IU]

## 2016-08-24 NOTE — Progress Notes (Signed)
Opdivo given today per orders. Patient tolerated it well, no problems. Vitals stable, discharged from clinic ambulatory with daughter.

## 2016-08-24 NOTE — Telephone Encounter (Signed)
-----   Message from [facility], PA-C sent at 08/24/2016 11:42 AM EDT ----- Stable

## 2016-08-24 NOTE — Telephone Encounter (Signed)
Pt aware that labs are stable.  

## 2016-08-24 NOTE — Patient Instructions (Signed)
[facility] Discharge Instructions for Patients Receiving Chemotherapy   Beginning January 23rd 2017 lab work for the Cancer Center will be done in the  Main lab at [place] on 1st floor. If you have a lab appointment with the Cancer Center please come in thru the  Main Entrance and check in at the main information desk   Today you received the following chemotherapy agents Opdivo  To help prevent nausea and vomiting after your treatment, we encourage you to take your nausea medication   If you develop nausea and vomiting, or diarrhea that is not controlled by your medication, call the clinic.  The clinic phone number is [phone]. Office hours are Monday-Friday 8:30am-5:00pm.  BELOW ARE SYMPTOMS THAT SHOULD BE REPORTED IMMEDIATELY:  *FEVER GREATER THAN 101.0 F  *CHILLS WITH OR WITHOUT FEVER  NAUSEA AND VOMITING THAT IS NOT CONTROLLED WITH YOUR NAUSEA MEDICATION  *UNUSUAL SHORTNESS OF BREATH  *UNUSUAL BRUISING OR BLEEDING  TENDERNESS IN MOUTH AND THROAT WITH OR WITHOUT PRESENCE OF ULCERS  *URINARY PROBLEMS  *BOWEL PROBLEMS  UNUSUAL RASH Items with * indicate a potential emergency and should be followed up as soon as possible. If you have an emergency after office hours please contact your primary care physician or go to the nearest emergency department.  Please call the clinic during office hours if you have any questions or concerns.   You may also contact the Patient Navigator at [phone] should you have any questions or need assistance in obtaining follow up care.      Resources For Cancer Patients and their Caregivers ? American Cancer Society: Can assist with transportation, wigs, general needs, runs Look Good Feel Better.        1-888-227-6333 ? Cancer Care: Provides financial assistance, online support groups, medication/co-pay assistance.  1-800-813-HOPE (4673) ? [facility] Assists [place] cancer  patients and their families through emotional , educational and financial support.  336-427-4357 ? [place] DSS Where to apply for food stamps, Medicaid and utility assistance. 336-342-1394 ? [facility]: Transportation to medical appointments. 336-347-2287 ? Social Security Administration: May apply for disability if have a Stage IV cancer. 336-342-7796 1-800-772-1213 ? [place] Aging, Disability and Transit Services: Assists with nutrition, care and transit needs. 336-349-2343          

## 2016-08-24 NOTE — Assessment & Plan Note (Addendum)
Stage IV right adenocarcinoma pancoast tumor of the lung (EGFR and ALK negative) complicated by right brachial plexus neuralgia.  S/P XRT.  Currently on Nivolumab salvage therapy with Zometa for bone metastasis.  Oncology history is updated.  Pre-treatment labs today.  I personally reviewed and went over laboratory results with the patient.  The results are noted within this dictation. Pre-chemotherapy labs today satisfy treatment criteria.    I personally reviewed and went over radiographic studies with the patient.  The results are noted within this dictation.  CT imaging demonstrates stability of disease.  ZOMETA is due, BUT, she recently saw a dentist in [place], [place] who removed some of her "jaw bone."  She does not recall the dentist's name.  She will call us later today or tomorrow with his name so we can request his assessment and plan regarding this issue.  She notes that her upper gums are much improved.  Still on open area on exam in the posterior upper gum line.  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  She is advised to continue to follow-up with her dentist .  Will HOLD and DISCONTINUE ZOMETA supportive therapy plan.  I have refilled her pain medication.  She notes that Dr. [place] Crane has discontinued her antihypertensive and metformin.  Continue with Nivolumab every 2 weeks.  Zometa is every 4 weeks, but again, on HOLD.  Smoking cessation education is provided.  She does not smoke every day.  She notes that she smokes only under stress.  Return in 4 weeks for follow-up.

## 2016-08-24 NOTE — Patient Instructions (Signed)
[place] at [facility] Discharge Instructions  RECOMMENDATIONS MADE BY THE CONSULTANT AND ANY TEST RESULTS WILL BE SENT TO YOUR REFERRING PHYSICIAN.  You were seen today by Kirby Crigler PA-C. Zometa discontinued. Opdivo today and every 2 weeks. Follow up in 4 weeks.   Thank you for choosing [place] at [facility] to provide your oncology and hematology care.  To afford each patient quality time with our provider, please arrive at least 15 minutes before your scheduled appointment time.   Beginning January 23rd 2017 lab work for the [facility] will be done in the  Main lab at [facility] on 1st floor. If you have a lab appointment with the [facility] please come in thru the  Main Entrance and check in at the main information desk  You need to re-schedule your appointment should you arrive 10 or more minutes late.  We strive to give you quality time with our providers, and arriving late affects you and other patients whose appointments are after yours.  Also, if you no show three or more times for appointments you may be dismissed from the clinic at the providers discretion.     Again, thank you for choosing [facility].  Our hope is that these requests will decrease the amount of time that you wait before being seen by our physicians.       _____________________________________________________________  Should you have questions after your visit to [facility], please contact our office at [phone] between the hours of 8:30 a.m. and 4:30 p.m.  Voicemails left after 4:30 p.m. will not be returned until the following business day.  For prescription refill requests, have your pharmacy contact our office.         Resources For Cancer Patients and their Caregivers ? American Cancer Society: Can assist with transportation, wigs, general needs, runs Look Good Feel Better.        7174252555 ? Cancer  Care: Provides financial assistance, online support groups, medication/co-pay assistance.  1-800-813-HOPE 905 087 0786) ? [facility] Assists [place] cancer patients and their families through emotional , educational and financial support.  380-200-8638 ? [place] DSS Where to apply for food stamps, Medicaid and utility assistance. (314) 079-8329 ? RCATS: Transportation to medical appointments. 478-190-6202 ? Social Security Administration: May apply for disability if have a Stage IV cancer. 413-531-1497 3040428486 ? [employer], Disability and Transit Services: Assists with nutrition, care and transit needs. [place] Support Programs: '@10RELATIVEDAYS'$ @ > Cancer Support Group  2nd Tuesday of the month 1pm-2pm, Journey Room  > Creative Journey  3rd Tuesday of the month 1130am-1pm, Journey Room  > Look Good Feel Better  1st Wednesday of the month 10am-12 noon, Journey Room (Call [place] to register [phone])

## 2016-09-07 ENCOUNTER — Encounter (HOSPITAL_COMMUNITY): Payer: Self-pay

## 2016-09-07 ENCOUNTER — Encounter (HOSPITAL_BASED_OUTPATIENT_CLINIC_OR_DEPARTMENT_OTHER): Payer: 59

## 2016-09-07 VITALS — BP 99/42 | HR 77 | Temp 98.8°F | Resp 18 | Wt 91.3 lb

## 2016-09-07 DIAGNOSIS — E278 Other specified disorders of adrenal gland: Secondary | ICD-10-CM

## 2016-09-07 DIAGNOSIS — Z5112 Encounter for antineoplastic immunotherapy: Secondary | ICD-10-CM | POA: Diagnosis not present

## 2016-09-07 DIAGNOSIS — C3411 Malignant neoplasm of upper lobe, right bronchus or lung: Secondary | ICD-10-CM

## 2016-09-07 DIAGNOSIS — C7951 Secondary malignant neoplasm of bone: Secondary | ICD-10-CM | POA: Diagnosis not present

## 2016-09-07 LAB — COMPREHENSIVE METABOLIC PANEL
ALBUMIN: 3.9 g/dL (ref 3.5–5.0)
ALK PHOS: 49 U/L (ref 38–126)
ALT: 13 U/L — AB (ref 14–54)
AST: 15 U/L (ref 15–41)
Anion gap: 8 (ref 5–15)
BILIRUBIN TOTAL: 0.4 mg/dL (ref 0.3–1.2)
BUN: 16 mg/dL (ref 6–20)
CALCIUM: 10 mg/dL (ref 8.9–10.3)
CO2: 21 mmol/L — ABNORMAL LOW (ref 22–32)
CREATININE: 1.54 mg/dL — AB (ref 0.44–1.00)
Chloride: 108 mmol/L (ref 101–111)
GFR calc Af Amer: 41 mL/min — ABNORMAL LOW (ref >60)
GFR calc non Af Amer: 36 mL/min — ABNORMAL LOW (ref >60)
GLUCOSE: 130 mg/dL — AB (ref 65–99)
Potassium: 4.6 mmol/L (ref 3.5–5.1)
Sodium: 137 mmol/L (ref 135–145)
TOTAL PROTEIN: 7.9 g/dL (ref 6.5–8.1)

## 2016-09-07 LAB — CBC WITH DIFFERENTIAL/PLATELET
BASOS ABS: 0 10*3/uL (ref 0.0–0.1)
BASOS PCT: 0 %
Eosinophils Absolute: 0.2 10*3/uL (ref 0.0–0.7)
Eosinophils Relative: 2 %
HEMATOCRIT: 34.2 % — AB (ref 36.0–46.0)
HEMOGLOBIN: 11.3 g/dL — AB (ref 12.0–15.0)
LYMPHS PCT: 24 %
Lymphs Abs: 2.4 10*3/uL (ref 0.7–4.0)
MCH: 30.8 pg (ref 26.0–34.0)
MCHC: 33 g/dL (ref 30.0–36.0)
MCV: 93.2 fL (ref 78.0–100.0)
Monocytes Absolute: 0.6 10*3/uL (ref 0.1–1.0)
Monocytes Relative: 6 %
NEUTROS ABS: 6.7 10*3/uL (ref 1.7–7.7)
NEUTROS PCT: 68 %
Platelets: 276 10*3/uL (ref 150–400)
RBC: 3.67 MIL/uL — ABNORMAL LOW (ref 3.87–5.11)
RDW: 17.6 % — ABNORMAL HIGH (ref 11.5–15.5)
WBC: 9.9 10*3/uL (ref 4.0–10.5)

## 2016-09-07 LAB — TSH: TSH: 3.392 u[IU]/mL (ref 0.350–4.500)

## 2016-09-07 MED ORDER — SODIUM CHLORIDE 0.9 % IV SOLN
240.0000 mg | Freq: Once | INTRAVENOUS | Status: AC
  Administered 2016-09-07: 240 mg via INTRAVENOUS
  Filled 2016-09-07: qty 4

## 2016-09-07 MED ORDER — SODIUM CHLORIDE 0.9 % IJ SOLN: 10.0000 mL | INTRAMUSCULAR | Status: DC | PRN

## 2016-09-07 MED ORDER — SODIUM CHLORIDE 0.9 % IV SOLN
Freq: Once | INTRAVENOUS | Status: AC
  Administered 2016-09-07: 12:00:00 via INTRAVENOUS

## 2016-09-07 MED ORDER — HEPARIN SOD (PORK) LOCK FLUSH 100 UNIT/ML IV SOLN
500.0000 [IU] | Freq: Once | INTRAVENOUS | Status: AC | PRN
  Administered 2016-09-07: 500 [IU]
  Filled 2016-09-07 (×2): qty 5

## 2016-09-07 NOTE — Patient Instructions (Signed)
[facility] Discharge Instructions for Patients Receiving Chemotherapy   Beginning January 23rd 2017 lab work for the Cancer Center will be done in the  Main lab at [facility] on 1st floor. If you have a lab appointment with the Cancer Center please come in thru the  Main Entrance and check in at the main information desk   Today you received the following chemotherapy agents:  Opdivo  If you develop nausea and vomiting, or diarrhea that is not controlled by your medication, call the clinic.  The clinic phone number is [phone]. Office hours are Monday-Friday 8:30am-5:00pm.  BELOW ARE SYMPTOMS THAT SHOULD BE REPORTED IMMEDIATELY:  *FEVER GREATER THAN 101.0 F  *CHILLS WITH OR WITHOUT FEVER  NAUSEA AND VOMITING THAT IS NOT CONTROLLED WITH YOUR NAUSEA MEDICATION  *UNUSUAL SHORTNESS OF BREATH  *UNUSUAL BRUISING OR BLEEDING  TENDERNESS IN MOUTH AND THROAT WITH OR WITHOUT PRESENCE OF ULCERS  *URINARY PROBLEMS  *BOWEL PROBLEMS  UNUSUAL RASH Items with * indicate a potential emergency and should be followed up as soon as possible. If you have an emergency after office hours please contact your primary care physician or go to the nearest emergency department.  Please call the clinic during office hours if you have any questions or concerns.   You may also contact the Patient Navigator at [phone] should you have any questions or need assistance in obtaining follow up care.      Resources For Cancer Patients and their Caregivers ? American Cancer Society: Can assist with transportation, wigs, general needs, runs Look Good Feel Better.        1-888-227-6333 ? Cancer Care: Provides financial assistance, online support groups, medication/co-pay assistance.  1-800-813-HOPE (4673) ? [facility] Assists [place] cancer patients and their families through emotional , educational and financial support.   336-427-4357 ? [place] DSS Where to apply for food stamps, Medicaid and utility assistance. 336-342-1394 ? RCATS: Transportation to medical appointments. 336-347-2287 ? Social Security Administration: May apply for disability if have a Stage IV cancer. 336-342-7796 1-800-772-1213 ? [place] Aging, Disability and Transit Services: Assists with nutrition, care and transit needs. 336-349-2343         

## 2016-09-07 NOTE — Progress Notes (Signed)
Caroleen Hamman, PA notified of creatinine of 1.54.  Okay to tx per PA.  Tolerated infusion w/o adverse reaction.  Alert, in no distress.  VSS.  Discharged ambulatory.

## 2016-09-18 NOTE — Progress Notes (Signed)
Patient on plan of care prior to pathways. 

## 2016-09-21 ENCOUNTER — Encounter (HOSPITAL_COMMUNITY): Payer: 59 | Attending: Hematology and Oncology | Admitting: Hematology & Oncology

## 2016-09-21 ENCOUNTER — Encounter (HOSPITAL_COMMUNITY): Payer: Self-pay | Admitting: Hematology & Oncology

## 2016-09-21 ENCOUNTER — Encounter (HOSPITAL_BASED_OUTPATIENT_CLINIC_OR_DEPARTMENT_OTHER): Payer: 59

## 2016-09-21 VITALS — BP 115/65 | HR 81 | Temp 98.0°F | Resp 18 | Wt 91.6 lb

## 2016-09-21 DIAGNOSIS — Z720 Tobacco use: Secondary | ICD-10-CM

## 2016-09-21 DIAGNOSIS — F17210 Nicotine dependence, cigarettes, uncomplicated: Secondary | ICD-10-CM | POA: Diagnosis not present

## 2016-09-21 DIAGNOSIS — C7972 Secondary malignant neoplasm of left adrenal gland: Secondary | ICD-10-CM | POA: Diagnosis not present

## 2016-09-21 DIAGNOSIS — C7989 Secondary malignant neoplasm of other specified sites: Secondary | ICD-10-CM | POA: Insufficient documentation

## 2016-09-21 DIAGNOSIS — C3411 Malignant neoplasm of upper lobe, right bronchus or lung: Secondary | ICD-10-CM | POA: Diagnosis present

## 2016-09-21 DIAGNOSIS — E785 Hyperlipidemia, unspecified: Secondary | ICD-10-CM | POA: Diagnosis not present

## 2016-09-21 DIAGNOSIS — C7971 Secondary malignant neoplasm of right adrenal gland: Secondary | ICD-10-CM | POA: Diagnosis not present

## 2016-09-21 DIAGNOSIS — E278 Other specified disorders of adrenal gland: Secondary | ICD-10-CM

## 2016-09-21 DIAGNOSIS — Z5112 Encounter for antineoplastic immunotherapy: Secondary | ICD-10-CM | POA: Diagnosis not present

## 2016-09-21 DIAGNOSIS — Z79899 Other long term (current) drug therapy: Secondary | ICD-10-CM | POA: Insufficient documentation

## 2016-09-21 DIAGNOSIS — C7951 Secondary malignant neoplasm of bone: Secondary | ICD-10-CM

## 2016-09-21 DIAGNOSIS — I10 Essential (primary) hypertension: Secondary | ICD-10-CM | POA: Insufficient documentation

## 2016-09-21 DIAGNOSIS — C781 Secondary malignant neoplasm of mediastinum: Secondary | ICD-10-CM | POA: Diagnosis not present

## 2016-09-21 DIAGNOSIS — C772 Secondary and unspecified malignant neoplasm of intra-abdominal lymph nodes: Secondary | ICD-10-CM | POA: Insufficient documentation

## 2016-09-21 DIAGNOSIS — Z9089 Acquired absence of other organs: Secondary | ICD-10-CM | POA: Insufficient documentation

## 2016-09-21 DIAGNOSIS — K219 Gastro-esophageal reflux disease without esophagitis: Secondary | ICD-10-CM | POA: Insufficient documentation

## 2016-09-21 DIAGNOSIS — J449 Chronic obstructive pulmonary disease, unspecified: Secondary | ICD-10-CM | POA: Insufficient documentation

## 2016-09-21 DIAGNOSIS — R739 Hyperglycemia, unspecified: Secondary | ICD-10-CM

## 2016-09-21 LAB — COMPREHENSIVE METABOLIC PANEL
ALK PHOS: 50 U/L (ref 38–126)
ALT: 10 U/L — ABNORMAL LOW (ref 14–54)
AST: 16 U/L (ref 15–41)
Albumin: 4 g/dL (ref 3.5–5.0)
Anion gap: 5 (ref 5–15)
BILIRUBIN TOTAL: 0.3 mg/dL (ref 0.3–1.2)
BUN: 16 mg/dL (ref 6–20)
CALCIUM: 9.8 mg/dL (ref 8.9–10.3)
CO2: 21 mmol/L — ABNORMAL LOW (ref 22–32)
CREATININE: 1.33 mg/dL — AB (ref 0.44–1.00)
Chloride: 109 mmol/L (ref 101–111)
GFR calc Af Amer: 49 mL/min — ABNORMAL LOW (ref >60)
GFR, EST NON AFRICAN AMERICAN: 42 mL/min — AB (ref >60)
Glucose, Bld: 121 mg/dL — ABNORMAL HIGH (ref 65–99)
Potassium: 4.1 mmol/L (ref 3.5–5.1)
Sodium: 135 mmol/L (ref 135–145)
TOTAL PROTEIN: 8.1 g/dL (ref 6.5–8.1)

## 2016-09-21 LAB — CBC WITH DIFFERENTIAL/PLATELET
BASOS ABS: 0 10*3/uL (ref 0.0–0.1)
BASOS PCT: 0 %
Eosinophils Absolute: 0.2 10*3/uL (ref 0.0–0.7)
Eosinophils Relative: 2 %
HEMATOCRIT: 33.2 % — AB (ref 36.0–46.0)
HEMOGLOBIN: 11.1 g/dL — AB (ref 12.0–15.0)
LYMPHS PCT: 27 %
Lymphs Abs: 2.8 10*3/uL (ref 0.7–4.0)
MCH: 30.9 pg (ref 26.0–34.0)
MCHC: 33.4 g/dL (ref 30.0–36.0)
MCV: 92.5 fL (ref 78.0–100.0)
MONOS PCT: 9 %
Monocytes Absolute: 1 10*3/uL (ref 0.1–1.0)
NEUTROS ABS: 6.6 10*3/uL (ref 1.7–7.7)
NEUTROS PCT: 62 %
Platelets: 385 10*3/uL (ref 150–400)
RBC: 3.59 MIL/uL — ABNORMAL LOW (ref 3.87–5.11)
RDW: 17.4 % — ABNORMAL HIGH (ref 11.5–15.5)
WBC: 10.6 10*3/uL — ABNORMAL HIGH (ref 4.0–10.5)

## 2016-09-21 MED ORDER — OCTREOTIDE ACETATE 30 MG IM KIT
PACK | INTRAMUSCULAR | Status: AC
  Filled 2016-09-21: qty 1

## 2016-09-21 MED ORDER — GLUCOSE BLOOD VI STRP: ORAL_STRIP | 12 refills | Status: DC

## 2016-09-21 MED ORDER — LIDOCAINE-PRILOCAINE 2.5-2.5 % EX CREA: TOPICAL_CREAM | CUTANEOUS | 5 refills | Status: DC

## 2016-09-21 MED ORDER — HEPARIN SOD (PORK) LOCK FLUSH 100 UNIT/ML IV SOLN
500.0000 [IU] | Freq: Once | INTRAVENOUS | Status: AC | PRN
  Administered 2016-09-21: 500 [IU]

## 2016-09-21 MED ORDER — ONETOUCH LANCETS MISC: 6 refills | Status: DC

## 2016-09-21 MED ORDER — SODIUM CHLORIDE 0.9 % IV SOLN
Freq: Once | INTRAVENOUS | Status: AC
  Administered 2016-09-21: 11:00:00 via INTRAVENOUS

## 2016-09-21 MED ORDER — SODIUM CHLORIDE 0.9 % IJ SOLN
10.0000 mL | INTRAMUSCULAR | Status: DC | PRN
  Administered 2016-09-21: 10 mL
  Filled 2016-09-21: qty 10

## 2016-09-21 MED ORDER — HEPARIN SOD (PORK) LOCK FLUSH 100 UNIT/ML IV SOLN
INTRAVENOUS | Status: AC
  Filled 2016-09-21: qty 5

## 2016-09-21 MED ORDER — SODIUM CHLORIDE 0.9 % IV SOLN
240.0000 mg | Freq: Once | INTRAVENOUS | Status: AC
  Administered 2016-09-21: 240 mg via INTRAVENOUS
  Filled 2016-09-21: qty 4

## 2016-09-21 NOTE — Progress Notes (Signed)
Melissa Sullivan tolerated chemo tx well without complaints or incident. VSS upon discharge. Pt discharged self ambulatory in satisfactory condition with family member

## 2016-09-21 NOTE — Progress Notes (Signed)
Melissa Melissa Sullivan, Melissa Melissa Sullivan, Melissa Melissa Sullivan, Melissa Melissa Sullivan  Biopsy on 08/01/2014 c/w poorly differentiated adenocarcinoma  XRT completed on 10/27/2014  Right Brachial plexus neuralgia  EGFR mutation negative ALK mutation negative    Cancer of upper lobe of right lung ([place])   07/28/2014 Imaging    CT chest: Large Melissa apical mass consistent with malignancy. This is destroying the Melissa 2nd rib with extension into adjacent soft tissue. Melissa hilar adenopathy with Melissa 5cm adrenal metastatic lesion.      08/01/2014 Initial Biopsy    Lung, needle/core biopsy(ies), right upper lobe - POORLY DIFFERENTIATED ADENOCARCINOMA, SEE COMMENT.      08/08/2014 PET scan    Large hypermetabolic Melissa apical mass with evidence of direct chest wall and mediastinal invasion, right retrocrural lymphadenopathy, extensive Melissa lymphadenopathy, and metastatic lesions to the adrenal glands       09/02/2014 - 11/04/2014 Chemotherapy    Cisplatin/Pemetrexed/Avastin every 21 days x 4 cycles      10/07/2014 - 10/27/2014 Radiation Therapy    Right lung apex for control of brachioplexopathy.      12/24/2014 - 02/25/2015 Chemotherapy    Alimta/Avastin every 21 days.      02/20/2015 Imaging    Increase in size of right adrenal metastasis and subjacent confluent retrocaval lymphadenopathy      02/25/2015 -  Chemotherapy    Nivolumab, zometa      05/04/2015 Imaging    CT CAP- Stable to slight decrease in the posterior right apical lesion. Stable appearance of posterior right upper rib an upper thoracic bony lesions. Slight improvement in right upper lobe tree-in-bud opacity. No new or progressive findings in...      07/28/2015 Imaging    CT CAP- Reduced size of the right apical pleural parenchymal lesion and reduced size of the right adrenal metastatic lesion. Resolution of prior retrocrural adenopathy.  Right eccentric T1 and T2 sclerosis with sclerosis and tapering of the right second..        11/17/2015 Imaging    CT CAP- Stable soft tissue thickening in the apex of the right hemi thorax. Stable right adrenal metastasis. Nodularity along the trachea and mainstem bronchi, relatively new from 07/28/2015, favoring adherent debris.      11/18/2015 Treatment Plan Change    Zometa HELD for upcoming tooth extraction      11/24/2015 Treatment Plan Change    Zometa on hold at this time in preparation for tooth extraction in March 2017.  Zometa las given on 11/18/2015.      02/03/2016 Imaging    CT CAP- Heterogeneous right apical masslike consolidation and right adrenal metastasis are unchanged      04/06/2016 Treatment Plan Change    Zometa restarted 6 weeks out from tooth extraction (04/06/2016)      05/12/2016 Imaging    CT CAP- NED in the chest, abdomen or pelvis.  Some areas of nodularity associated with the mainstem bronchi in the left upper lobe bronchus, favored to represent adherent inspissated secretions      08/17/2016 Imaging    CT CAP- 1. Stable CTs of the chest and abdomen. No evidence of progressive metastatic disease. 2. Probable treated Melissa Sullivan at the right apex, right adrenal gland and T2 vertebral body, stable. 3. Fluctuating nodularity along the walls of the trachea and mainstem bronchi, likely secretions.        CURRENT THERAPY: Nivolumab/zometa/Aranesp  INTERVAL HISTORY: [name] 60 y.o. female returns for follow-up of Melissa Melissa Sullivan adenocarcinoma of  the lung. She is doing well.  She sleeps ok. She denies any pain. Her mood is good. She continues on single agent nivolumab.   Melissa Melissa Sullivan is accompanied by her daughter. She is here for cycle 27 Nivolumab and presents in the treatment chair. She states "i'm doing good".  She has a good appetite. She has not had any other teeth pulled. When she has her teeth in, especially her top teeth, she eats "real good". She denies any diarrhea.   She has seen a podiatrist at [facility] due  to swelling in her toe.   I personally reviewed and went over labs with the patient.  She notes that she does her ADL's without any difficulty. She is active.  No SOB, CP.   MEDICAL HISTORY: Past Medical History:  Diagnosis Date  . Adrenal mass, right ([place]) 07/28/2014  . Bone Melissa Sullivan ([place]) 04/05/2016  . Cancer ([place])    lung  right  . Diabetes mellitus without complication ([place])   . Hyperlipidemia   . Hypertension   . Lung mass 07/28/2014  . Reflux     has Adrenal mass, right ([place]); Cancer of upper lobe of right lung ([place]); ARF (acute renal failure) ([place]); Abnormal finding on imaging; Oropharyngeal dysphagia; Nausea and vomiting; Hypercalcemia; Anemia in neoplastic disease; Dysphagia; Encounter for screening colonoscopy; Bone Melissa Sullivan ([place]); Portacath in place; and Tobacco use on her problem list.      has No Known Allergies.  Melissa Melissa Sullivan does not currently have medications on file.  SURGICAL HISTORY: Past Surgical History:  Procedure Laterality Date  . APPENDECTOMY    . ESOPHAGOGASTRODUODENOSCOPY N/A 12/09/2014   FOY:DXAJOI esophageal stricture/mild-to-noderate erosive gastritis. negative H.pylori  . FLEXIBLE SIGMOIDOSCOPY  2011   Dr. [name]: hyperplastic polyp  . LUNG BIOPSY Right 07/2014   CT guided  . MALONEY DILATION N/A 12/09/2014   Procedure: [name] DILATION;  Surgeon: [name], MD;  Location: [facility];  Service: Endoscopy;  Laterality: N/A;  . PORTACATH PLACEMENT Left 09/01/2014  . SAVORY DILATION N/A 12/09/2014   Procedure: SAVORY DILATION;  Surgeon: [name], MD;  Location: [facility];  Service: Endoscopy;  Laterality: N/A;    SOCIAL HISTORY: Social History   Social History  . Marital status: Married    Spouse name: N/A  . Number of children: N/A  . Years of education: N/A   Occupational History  . Not on file.   Social History Main Topics  . Smoking status: Light Tobacco Smoker    Packs/day: 0.30    Types: Cigarettes  . Smokeless  tobacco: Never Used  . Alcohol use No  . Drug use: No  . Sexual activity: Not on file   Other Topics Concern  . Not on file   Social History Narrative  . No narrative on file    FAMILY HISTORY: Family History  Problem Relation Age of Onset  . Cancer Sister     Review of Systems  Constitutional: Negative.   HENT: Negative.   Eyes: Negative.   Respiratory: Negative.   Cardiovascular: Negative.   Gastrointestinal: Negative.  Negative for diarrhea.  Genitourinary: Negative.   Musculoskeletal: Negative.        Toe swelling  Skin: Negative.   Neurological: Negative.   Endo/Heme/Allergies: Negative.   Psychiatric/Behavioral: Negative.   All other systems reviewed and are negative. 14 point review of systems was performed and is negative except as detailed under history of present illness and above  PHYSICAL EXAMINATION  ECOG PERFORMANCE STATUS: 1 - Symptomatic but completely ambulatory  There were no vitals filed for this visit.  Vitals with BMI 09/21/2016  Height   Weight 91 lbs 10 oz  BMI   Systolic 027  Diastolic 74  Pulse 253  Respirations 18    Physical Exam  Constitutional: She is oriented to person, place, and time and well-developed, well-nourished, and in no distress.  HENT:  Head: Normocephalic and atraumatic.  Nose: Nose normal.  Mouth/Throat: Oropharynx is clear and moist. No oropharyngeal exudate.  Eyes: Conjunctivae and EOM are normal. Pupils are equal, round, and reactive to light. Right eye exhibits no discharge. Left eye exhibits no discharge. No scleral icterus.  Neck: Normal range of motion. Neck supple. No tracheal deviation present. No thyromegaly present.  Cardiovascular: Normal rate, regular rhythm and normal heart sounds.  Exam reveals no gallop and no friction rub.   No murmur heard. Pulmonary/Chest: Effort normal and breath sounds normal. She has no wheezes. She has no rales.  Abdominal: Soft. Bowel sounds are normal. She exhibits no  distension and no mass. There is no tenderness. There is no rebound and no guarding.  Musculoskeletal: Normal range of motion. She exhibits no edema.  Lymphadenopathy:    She has no cervical adenopathy.  Neurological: She is alert and oriented to person, place, and time. She has normal reflexes. No cranial nerve deficit. Gait normal. Coordination normal.  Skin: Skin is warm and dry. No rash noted.  Psychiatric: Mood, memory, affect and judgment normal.  Nursing note and vitals reviewed.    LABORATORY DATA: I have reviewed the data below as listed. CBC    Component Value Date/Time   WBC 10.6 (H) 09/21/2016 0934   RBC 3.59 (L) 09/21/2016 0934   HGB 11.1 (L) 09/21/2016 0934   HCT 33.2 (L) 09/21/2016 0934   PLT 385 09/21/2016 0934   MCV 92.5 09/21/2016 0934   MCH 30.9 09/21/2016 0934   MCHC 33.4 09/21/2016 0934   RDW 17.4 (H) 09/21/2016 0934   LYMPHSABS 2.8 09/21/2016 0934   MONOABS 1.0 09/21/2016 0934   EOSABS 0.2 09/21/2016 0934   BASOSABS 0.0 09/21/2016 0934    Results for Melissa Melissa Sullivan, Melissa Melissa Sullivan (MRN [id number]) as of 09/21/2016 10:31  Ref. Range 09/21/2016 09:34  Sodium Latest Ref Range: 135 - 145 mmol/L 135  Potassium Latest Ref Range: 3.5 - 5.1 mmol/L 4.1  Chloride Latest Ref Range: 101 - 111 mmol/L 109  CO2 Latest Ref Range: 22 - 32 mmol/L 21 (L)  BUN Latest Ref Range: 6 - 20 mg/dL 16  Creatinine Latest Ref Range: 0.44 - 1.00 mg/dL 1.33 (H)  Calcium Latest Ref Range: 8.9 - 10.3 mg/dL 9.8  EGFR (Non-African Amer.) Latest Ref Range: >60 mL/min 42 (L)  EGFR (African American) Latest Ref Range: >60 mL/min 49 (L)  Glucose Latest Ref Range: 65 - 99 mg/dL 121 (H)  Anion gap Latest Ref Range: 5 - 15  5  Alkaline Phosphatase Latest Ref Range: 38 - 126 U/L 50  Albumin Latest Ref Range: 3.5 - 5.0 g/dL 4.0  AST Latest Ref Range: 15 - 41 U/L 16  ALT Latest Ref Range: 14 - 54 U/L 10 (L)  Total Protein Latest Ref Range: 6.5 - 8.1 g/dL 8.1  Total Bilirubin Latest Ref Range: 0.3 - 1.2  mg/dL 0.3  WBC Latest Ref Range: 4.0 - 10.5 K/uL 10.6 (H)  RBC Latest Ref Range: 3.87 - 5.11 MIL/uL 3.59 (L)  Hemoglobin Latest Ref Range: 12.0 -  15.0 g/dL 11.1 (L)  HCT Latest Ref Range: 36.0 - 46.0 % 33.2 (L)  MCV Latest Ref Range: 78.0 - 100.0 fL 92.5  MCH Latest Ref Range: 26.0 - 34.0 pg 30.9  MCHC Latest Ref Range: 30.0 - 36.0 g/dL 33.4  RDW Latest Ref Range: 11.5 - 15.5 % 17.4 (H)  Platelets Latest Ref Range: 150 - 400 K/uL 385  Neutrophils Latest Units: % 62  Lymphocytes Latest Units: % 27  Monocytes Relative Latest Units: % 9  Eosinophil Latest Units: % 2  Basophil Latest Units: % 0  NEUT# Latest Ref Range: 1.7 - 7.7 K/uL 6.6  Lymphocyte # Latest Ref Range: 0.7 - 4.0 K/uL 2.8  Monocyte # Latest Ref Range: 0.1 - 1.0 K/uL 1.0  Eosinophils Absolute Latest Ref Range: 0.0 - 0.7 K/uL 0.2  Basophils Absolute Latest Ref Range: 0.0 - 0.1 K/uL 0.0   RADIOLOGY: I have personally reviewed the radiological images as listed and agreed with the findings in the report.Study Result   CLINICAL DATA:  Right lung cancer post radiation therapy. Chemotherapy ongoing.  EXAM: CT CHEST AND ABDOMEN WITH CONTRAST  TECHNIQUE: Multidetector CT imaging of the chest and abdomen was performed following the standard protocol during bolus administration of intravenous contrast.  CONTRAST:  6m ISOVUE-300 IOPAMIDOL (ISOVUE-300) INJECTION 61%  COMPARISON:  CT 05/12/2016 and 02/03/2016.  FINDINGS: CT CHEST FINDINGS  Cardiovascular: Stable atherosclerosis of the aorta, great vessels and coronary arteries. There is a left IJ Port-A-Cath which terminates in the upper SVC. The heart size is normal. There is no pericardial effusion.  Mediastinum/Melissa Sullivan: There are no enlarged mediastinal, hilar or axillary lymph Melissa Sullivan. The thyroid gland, trachea and esophagus demonstrate no significant findings.  Lungs/Pleura: There is no pleural effusion. The partially calcified, mass-like density at the  right apex appears unchanged. This remains difficult to measure given its irregular shape, although measures approximately 3.6 x 2.8 cm on image 21. No new or enlarging pulmonary nodules are present. There is emphysema with scattered subpleural reticulation and mild bronchiectasis. Small areas of mucous plugging are present in both upper lobes. Fluctuating nodularity along the walls of the trachea and proximal bronchi are again noted, most consistent with adherent secretions. No fixed nodules are seen to suggest neoplasm.  Musculoskeletal/Chest wall: Stable sclerosis of the T2 vertebral body, probably a treated metastasis. Stable sclerosis of the upper right-sided ribs. No new lytic or blastic lesions are identified. No evidence of chest wall lesion.  CT ABDOMEN FINDINGS  Hepatobiliary: The liver is normal in density without focal abnormality. No evidence of gallstones, gallbladder wall thickening or biliary dilatation.  Pancreas: Unremarkable. No pancreatic ductal dilatation or surrounding inflammatory changes.  Spleen: Normal in size without focal abnormality.  Adrenals/Urinary Tract: Complex, partially calcified mass involving the right adrenal gland is again noted. This has a thin neck extending posterior to the IVC. As measured in a similar fashion to the prior examination, this appears unchanged, measuring approximately 3.7 x 2.4 cm on image 54. The left adrenal gland appears normal. Both kidneys appear normal. No evidence of renal mass, hydronephrosis or ureteral calculus. Bladder not imaged.  Stomach/Bowel: Suggested wall thickening of the distal stomach, likely due to peristalsis. No surrounding abnormality. No evidence of bowel wall thickening, distention or surrounding inflammatory change.  Vascular/Lymphatic: Stable aortic and branch vessel atherosclerosis. No enlarged abdominal pelvic lymph Melissa Sullivan.  Other: No ascites or peritoneal nodularity. Stable  small umbilical hernia containing only fat.  Musculoskeletal: No acute or significant osseous findings.  IMPRESSION:  1. Stable CTs of the chest and abdomen. No evidence of progressive metastatic disease. 2. Probable treated Melissa Sullivan at the right apex, right adrenal gland and T2 vertebral body, stable. 3. Fluctuating nodularity along the walls of the trachea and mainstem bronchi, likely secretions. 4.  Emphysema. (ICD10-J43.9) 5.  Aortic Atherosclerosis (ICD10-170.0)   Electronically Signed   By: Richardean Sale M.D.   On: 08/17/2016 15:37      ASSESSMENT and THERAPY PLAN:   Melissa Melissa Sullivan adenocarcinoma of the lung, EGFR and ALK negative  She is certainly doing well clinically. CT scans have show stable disease   We will continue with current therapy. She has absolutely no major complaints today. Again reviewed the potential side effects of her current treatment. She will proceed with Nivolumab today.   Hypercalcemia/bony metastatic disease  Zometa was last given in August secondary to dental issues. Will continue to monitor.   Tobacco ABUSE  I re-emphasized the benefits of smoking cessation. I have offered her methods to quit in the past. She has cut back significantly.  I have refilled her EMLA cream and one touch ultra test strips and needles. She uses the pharmacy at [facility].  She will return for chemotherapy in 2 weeks and for a follow up in 4 weeks.   Orders Placed This Encounter  Procedures  . CBC with Differential    Standing Status:   Future    Standing Expiration Date:   09/21/2017  . Comprehensive metabolic panel    Standing Status:   Future    Standing Expiration Date:   09/21/2017  . CBC with Differential    Standing Status:   Future    Standing Expiration Date:   09/21/2017  . Comprehensive metabolic panel    Standing Status:   Future    Standing Expiration Date:   09/21/2017   Meds ordered this encounter  Medications  . lidocaine-prilocaine (EMLA) cream     Sig: Apply a quarter size amount to port site 1 hour prior to chemo. Do not rub in. Cover with plastic wrap.    Dispense:  30 g    Refill:  5  . glucose blood test strip    Sig: Use as instructed    Dispense:  100 each    Refill:  12  . ONE TOUCH LANCETS MISC    Sig: Use as directed    Dispense:  200 each    Refill:  6     All questions were answered. The patient knows to call the clinic with any problems, questions or concerns. We can certainly see the patient much sooner if necessary.   This document serves as a record of services personally performed by Ancil Linsey, MD. It was created on her behalf by Martinique Casey, a trained medical scribe. The creation of this record is based on the scribe's personal observations and the provider's statements to them. This document has been checked and approved by the attending provider.  I have reviewed the above documentation for accuracy and completeness, and I agree with the above.  This note was signed electronically  Larene Beach K. Whitney Muse, MD

## 2016-09-21 NOTE — Patient Instructions (Signed)
[facility] Discharge Instructions for Patients Receiving Chemotherapy   Beginning January 23rd 2017 lab work for the [facility] will be done in the  Main lab at [facility] on 1st floor. If you have a lab appointment with the [place] please come in thru the  Main Entrance and check in at the main information desk   Today you received the following chemotherapy agents Opdivo. Follow-up as scheduled. Call clinic for any questions or concerns  To help prevent nausea and vomiting after your treatment, we encourage you to take your nausea medication   If you develop nausea and vomiting, or diarrhea that is not controlled by your medication, call the clinic.  The clinic phone number is [phone]. Office hours are Monday-Friday 8:30am-5:00pm.  BELOW ARE SYMPTOMS THAT SHOULD BE REPORTED IMMEDIATELY:  *FEVER GREATER THAN 101.0 F  *CHILLS WITH OR WITHOUT FEVER  NAUSEA AND VOMITING THAT IS NOT CONTROLLED WITH YOUR NAUSEA MEDICATION  *UNUSUAL SHORTNESS OF BREATH  *UNUSUAL BRUISING OR BLEEDING  TENDERNESS IN MOUTH AND THROAT WITH OR WITHOUT PRESENCE OF ULCERS  *URINARY PROBLEMS  *BOWEL PROBLEMS  UNUSUAL RASH Items with * indicate a potential emergency and should be followed up as soon as possible. If you have an emergency after office hours please contact your primary care physician or go to the nearest emergency department.  Please call the clinic during office hours if you have any questions or concerns.   You may also contact the Patient Navigator at [phone] should you have any questions or need assistance in obtaining follow up care.      Resources For Cancer Patients and their Caregivers ? American Cancer Society: Can assist with transportation, wigs, general needs, runs Look Good Feel Better.        316-501-4829 ? Cancer Care: Provides financial assistance, online support groups, medication/co-pay assistance.  1-800-813-HOPE  339-858-3888) ? [place] Assists [place] cancer patients and their families through emotional , educational and financial support.  203-629-3344 ? [place] DSS Where to apply for food stamps, Medicaid and utility assistance. (629)770-2527 ? RCATS: Transportation to medical appointments. 801-817-0924 ? Social Security Administration: May apply for disability if have a Stage IV cancer. 210-512-7714 252-769-8486 ? [employer], Disability and Transit Services: Assists with nutrition, care and transit needs. [phone]

## 2016-09-21 NOTE — Patient Instructions (Addendum)
[place] at [facility] Discharge Instructions  RECOMMENDATIONS MADE BY THE CONSULTANT AND ANY TEST RESULTS WILL BE SENT TO YOUR REFERRING PHYSICIAN.  You saw Dr.Penland today. Chemo and labs only in 2 weeks. Chemo, labs and follow up in 4 weeks. Glucose test strips and lancets sent to [facility]. See Amy at checkout for appointments.  Thank you for choosing [place] at [facility] to provide your oncology and hematology care.  To afford each patient quality time with our provider, please arrive at least 15 minutes before your scheduled appointment time.   Beginning January 23rd 2017 lab work for the [facility] will be done in the  Main lab at [facility] on 1st floor. If you have a lab appointment with the [place] please come in thru the  Main Entrance and check in at the main information desk  You need to re-schedule your appointment should you arrive 10 or more minutes late.  We strive to give you quality time with our providers, and arriving late affects you and other patients whose appointments are after yours.  Also, if you no show three or more times for appointments you may be dismissed from the clinic at the providers discretion.     Again, thank you for choosing [facility].  Our hope is that these requests will decrease the amount of time that you wait before being seen by our physicians.       _____________________________________________________________  Should you have questions after your visit to [facility], please contact our office at [phone] between the hours of 8:30 a.m. and 4:30 p.m.  Voicemails left after 4:30 p.m. will not be returned until the following business day.  For prescription refill requests, have your pharmacy contact our office.         Resources For Cancer Patients and their Caregivers ? American Cancer Society: Can assist with transportation, wigs, general  needs, runs Look Good Feel Better.        4240619575 ? Cancer Care: Provides financial assistance, online support groups, medication/co-pay assistance.  1-800-813-HOPE 5807703639) ? [place] Assists [place] cancer patients and their families through emotional , educational and financial support.  ([phone] ? [place] DSS Where to apply for food stamps, Medicaid and utility assistance. (737)712-5599 ? RCATS: Transportation to medical appointments. (250)523-4622 ? Social Security Administration: May apply for disability if have a Stage IV cancer. 972-481-0937 364-390-7484 ? [employer], Disability and Transit Services: Assists with nutrition, care and transit needs. [place] Support Programs: '@10RELATIVEDAYS'$ @ > Cancer Support Group  2nd Tuesday of the month 1pm-2pm, Journey Room  > Creative Journey  3rd Tuesday of the month 1130am-1pm, Journey Room  > Look Good Feel Better  1st Wednesday of the month 10am-12 noon, Journey Room (Call [place] to register [phone])

## 2016-09-30 ENCOUNTER — Other Ambulatory Visit (HOSPITAL_COMMUNITY)
Admission: RE | Admit: 2016-09-30 | Discharge: 2016-09-30 | Disposition: A | Discharge status: Home / Self Care | Payer: 59 | Source: Ambulatory Visit | Attending: Podiatry | Admitting: Podiatry

## 2016-09-30 DIAGNOSIS — L02611 Cutaneous abscess of right foot: Secondary | ICD-10-CM | POA: Diagnosis not present

## 2016-09-30 LAB — CBC WITH DIFFERENTIAL/PLATELET
BASOS ABS: 0 10*3/uL (ref 0.0–0.1)
BASOS PCT: 0 %
EOS PCT: 2 %
Eosinophils Absolute: 0.2 10*3/uL (ref 0.0–0.7)
HEMATOCRIT: 33.5 % — AB (ref 36.0–46.0)
Hemoglobin: 11.1 g/dL — ABNORMAL LOW (ref 12.0–15.0)
Lymphocytes Relative: 32 %
Lymphs Abs: 3.2 10*3/uL (ref 0.7–4.0)
MCH: 30.9 pg (ref 26.0–34.0)
MCHC: 33.1 g/dL (ref 30.0–36.0)
MCV: 93.3 fL (ref 78.0–100.0)
MONO ABS: 0.7 10*3/uL (ref 0.1–1.0)
MONOS PCT: 7 %
NEUTROS ABS: 5.9 10*3/uL (ref 1.7–7.7)
Neutrophils Relative %: 59 %
PLATELETS: 382 10*3/uL (ref 150–400)
RBC: 3.59 MIL/uL — ABNORMAL LOW (ref 3.87–5.11)
RDW: 17.3 % — AB (ref 11.5–15.5)
WBC: 10.1 10*3/uL (ref 4.0–10.5)

## 2016-09-30 LAB — BASIC METABOLIC PANEL
Anion gap: 5 (ref 5–15)
BUN: 16 mg/dL (ref 6–20)
CO2: 23 mmol/L (ref 22–32)
CREATININE: 1.46 mg/dL — AB (ref 0.44–1.00)
Calcium: 10.3 mg/dL (ref 8.9–10.3)
Chloride: 107 mmol/L (ref 101–111)
GFR calc Af Amer: 44 mL/min — ABNORMAL LOW (ref >60)
GFR, EST NON AFRICAN AMERICAN: 38 mL/min — AB (ref >60)
GLUCOSE: 91 mg/dL (ref 65–99)
Potassium: 4.3 mmol/L (ref 3.5–5.1)
SODIUM: 135 mmol/L (ref 135–145)

## 2016-09-30 LAB — SEDIMENTATION RATE: SED RATE: 85 mm/h — AB (ref 0–22)

## 2016-09-30 LAB — C-REACTIVE PROTEIN

## 2016-10-05 ENCOUNTER — Encounter (HOSPITAL_COMMUNITY): Payer: Self-pay

## 2016-10-05 ENCOUNTER — Encounter (HOSPITAL_BASED_OUTPATIENT_CLINIC_OR_DEPARTMENT_OTHER): Payer: 59

## 2016-10-05 VITALS — BP 113/64 | HR 87 | Temp 99.2°F | Resp 18 | Wt 92.7 lb

## 2016-10-05 DIAGNOSIS — Z5112 Encounter for antineoplastic immunotherapy: Secondary | ICD-10-CM | POA: Diagnosis not present

## 2016-10-05 DIAGNOSIS — E278 Other specified disorders of adrenal gland: Secondary | ICD-10-CM

## 2016-10-05 DIAGNOSIS — C3411 Malignant neoplasm of upper lobe, right bronchus or lung: Secondary | ICD-10-CM

## 2016-10-05 DIAGNOSIS — C7951 Secondary malignant neoplasm of bone: Secondary | ICD-10-CM | POA: Diagnosis not present

## 2016-10-05 LAB — CBC WITH DIFFERENTIAL/PLATELET
BASOS ABS: 0 10*3/uL (ref 0.0–0.1)
Basophils Relative: 0 %
Eosinophils Absolute: 0.2 10*3/uL (ref 0.0–0.7)
Eosinophils Relative: 2 %
HEMATOCRIT: 31.8 % — AB (ref 36.0–46.0)
HEMOGLOBIN: 10.5 g/dL — AB (ref 12.0–15.0)
LYMPHS PCT: 27 %
Lymphs Abs: 2.4 10*3/uL (ref 0.7–4.0)
MCH: 30.8 pg (ref 26.0–34.0)
MCHC: 33 g/dL (ref 30.0–36.0)
MCV: 93.3 fL (ref 78.0–100.0)
MONO ABS: 0.8 10*3/uL (ref 0.1–1.0)
Monocytes Relative: 9 %
NEUTROS ABS: 5.6 10*3/uL (ref 1.7–7.7)
NEUTROS PCT: 62 %
Platelets: 329 10*3/uL (ref 150–400)
RBC: 3.41 MIL/uL — AB (ref 3.87–5.11)
RDW: 16.8 % — AB (ref 11.5–15.5)
WBC: 9.1 10*3/uL (ref 4.0–10.5)

## 2016-10-05 LAB — COMPREHENSIVE METABOLIC PANEL
ALBUMIN: 3.9 g/dL (ref 3.5–5.0)
ALK PHOS: 45 U/L (ref 38–126)
ALT: 10 U/L — AB (ref 14–54)
AST: 14 U/L — AB (ref 15–41)
Anion gap: 4 — ABNORMAL LOW (ref 5–15)
BILIRUBIN TOTAL: 0.3 mg/dL (ref 0.3–1.2)
BUN: 13 mg/dL (ref 6–20)
CO2: 22 mmol/L (ref 22–32)
CREATININE: 1.39 mg/dL — AB (ref 0.44–1.00)
Calcium: 10.2 mg/dL (ref 8.9–10.3)
Chloride: 107 mmol/L (ref 101–111)
GFR calc Af Amer: 47 mL/min — ABNORMAL LOW (ref >60)
GFR, EST NON AFRICAN AMERICAN: 40 mL/min — AB (ref >60)
GLUCOSE: 96 mg/dL (ref 65–99)
Potassium: 4.6 mmol/L (ref 3.5–5.1)
Sodium: 133 mmol/L — ABNORMAL LOW (ref 135–145)
TOTAL PROTEIN: 7.4 g/dL (ref 6.5–8.1)

## 2016-10-05 LAB — TSH: TSH: 1.448 u[IU]/mL (ref 0.350–4.500)

## 2016-10-05 MED ORDER — SODIUM CHLORIDE 0.9 % IV SOLN
Freq: Once | INTRAVENOUS | Status: AC
  Administered 2016-10-05: 12:00:00 via INTRAVENOUS

## 2016-10-05 MED ORDER — NIVOLUMAB CHEMO INJECTION 100 MG/10ML
240.0000 mg | Freq: Once | INTRAVENOUS | Status: AC
  Administered 2016-10-05: 240 mg via INTRAVENOUS
  Filled 2016-10-05: qty 20

## 2016-10-05 MED ORDER — HEPARIN SOD (PORK) LOCK FLUSH 100 UNIT/ML IV SOLN
500.0000 [IU] | Freq: Once | INTRAVENOUS | Status: AC | PRN
  Administered 2016-10-05: 500 [IU]

## 2016-10-05 MED ORDER — SODIUM CHLORIDE 0.9 % IJ SOLN: 10.0000 mL | INTRAMUSCULAR | Status: DC | PRN

## 2016-10-05 NOTE — Progress Notes (Signed)
Tolerated infusion w/o adverse reaction.  Alert, in no distress.  VSS.  Discharged ambulatory.  

## 2016-10-05 NOTE — Patient Instructions (Signed)
[facility] Discharge Instructions for Patients Receiving Chemotherapy   Beginning January 23rd 2017 [facility] in the  Main lab at [facility] on 1st floor. If you have a lab appointment with the [place] please come in thru the  Main Entrance and check in at the main information desk   Today you received the following chemotherapy agents:  nivolumab (Opdivo)  If you develop nausea and vomiting, or diarrhea that is not controlled by your medication, call the clinic.  The clinic phone number is [phone]. Office hours are Monday-Friday 8:30am-5:00pm.  BELOW ARE SYMPTOMS THAT SHOULD BE REPORTED IMMEDIATELY:  *FEVER GREATER THAN 101.0 F  *CHILLS WITH OR WITHOUT FEVER  NAUSEA AND VOMITING THAT IS NOT CONTROLLED WITH YOUR NAUSEA MEDICATION  *UNUSUAL SHORTNESS OF BREATH  *UNUSUAL BRUISING OR BLEEDING  TENDERNESS IN MOUTH AND THROAT WITH OR WITHOUT PRESENCE OF ULCERS  *URINARY PROBLEMS  *BOWEL PROBLEMS  UNUSUAL RASH Items with * indicate a potential emergency and should be followed up as soon as possible. If you have an emergency after office hours please contact your primary care physician or go to the nearest emergency department.  Please call the clinic during office hours if you have any questions or concerns.   You may also contact the Patient Navigator at [phone] should you have any questions or need assistance in obtaining follow up care.      Resources For Cancer Patients and their Caregivers ? American Cancer Society: Can assist with transportation, wigs, general needs, runs Look Good Feel Better.        408-874-6311 ? Cancer Care: Provides financial assistance, online support groups, medication/co-pay assistance.  1-800-813-HOPE (458)689-0625) ? [place] Assists [place] cancer patients and their families through emotional , educational and financial support.   ([phone] ? [place] DSS Where to apply for food stamps, Medicaid and utility assistance. ([phone] ? RCATS: Transportation to medical appointments. 817-710-1068 ? Social Security Administration: May apply for disability if have a Stage IV cancer. [phone] 347-250-1023 ? [employer], Disability and Transit Services: Assists with nutrition, care and transit needs. [phone]

## 2016-10-16 ENCOUNTER — Encounter (HOSPITAL_COMMUNITY): Payer: Self-pay | Admitting: Hematology & Oncology

## 2016-10-19 ENCOUNTER — Encounter (HOSPITAL_BASED_OUTPATIENT_CLINIC_OR_DEPARTMENT_OTHER): Payer: 59

## 2016-10-19 ENCOUNTER — Encounter (HOSPITAL_BASED_OUTPATIENT_CLINIC_OR_DEPARTMENT_OTHER): Payer: 59 | Admitting: Hematology & Oncology

## 2016-10-19 VITALS — BP 107/62 | HR 86 | Temp 98.7°F | Resp 18 | Wt 92.0 lb

## 2016-10-19 VITALS — Temp 98.8°F

## 2016-10-19 DIAGNOSIS — C3411 Malignant neoplasm of upper lobe, right bronchus or lung: Secondary | ICD-10-CM

## 2016-10-19 DIAGNOSIS — C7972 Secondary malignant neoplasm of left adrenal gland: Secondary | ICD-10-CM | POA: Diagnosis not present

## 2016-10-19 DIAGNOSIS — C7971 Secondary malignant neoplasm of right adrenal gland: Secondary | ICD-10-CM

## 2016-10-19 DIAGNOSIS — E278 Other specified disorders of adrenal gland: Secondary | ICD-10-CM

## 2016-10-19 DIAGNOSIS — C7951 Secondary malignant neoplasm of bone: Secondary | ICD-10-CM

## 2016-10-19 DIAGNOSIS — Z720 Tobacco use: Secondary | ICD-10-CM

## 2016-10-19 DIAGNOSIS — Z5112 Encounter for antineoplastic immunotherapy: Secondary | ICD-10-CM

## 2016-10-19 LAB — COMPREHENSIVE METABOLIC PANEL
ALBUMIN: 3.8 g/dL (ref 3.5–5.0)
ALK PHOS: 44 U/L (ref 38–126)
ALT: 10 U/L — AB (ref 14–54)
AST: 15 U/L (ref 15–41)
Anion gap: 6 (ref 5–15)
BILIRUBIN TOTAL: 0.2 mg/dL — AB (ref 0.3–1.2)
BUN: 17 mg/dL (ref 6–20)
CO2: 23 mmol/L (ref 22–32)
CREATININE: 1.35 mg/dL — AB (ref 0.44–1.00)
Calcium: 9.9 mg/dL (ref 8.9–10.3)
Chloride: 105 mmol/L (ref 101–111)
GFR calc Af Amer: 48 mL/min — ABNORMAL LOW (ref >60)
GFR, EST NON AFRICAN AMERICAN: 42 mL/min — AB (ref >60)
GLUCOSE: 93 mg/dL (ref 65–99)
Potassium: 4.1 mmol/L (ref 3.5–5.1)
Sodium: 134 mmol/L — ABNORMAL LOW (ref 135–145)
TOTAL PROTEIN: 7.2 g/dL (ref 6.5–8.1)

## 2016-10-19 LAB — CBC WITH DIFFERENTIAL/PLATELET
BASOS ABS: 0 10*3/uL (ref 0.0–0.1)
BASOS PCT: 0 %
Eosinophils Absolute: 0.2 10*3/uL (ref 0.0–0.7)
Eosinophils Relative: 3 %
HEMATOCRIT: 33.1 % — AB (ref 36.0–46.0)
HEMOGLOBIN: 10.7 g/dL — AB (ref 12.0–15.0)
LYMPHS PCT: 26 %
Lymphs Abs: 2 10*3/uL (ref 0.7–4.0)
MCH: 30.5 pg (ref 26.0–34.0)
MCHC: 32.3 g/dL (ref 30.0–36.0)
MCV: 94.3 fL (ref 78.0–100.0)
MONO ABS: 0.6 10*3/uL (ref 0.1–1.0)
Monocytes Relative: 8 %
NEUTROS ABS: 5 10*3/uL (ref 1.7–7.7)
NEUTROS PCT: 63 %
Platelets: 290 10*3/uL (ref 150–400)
RBC: 3.51 MIL/uL — AB (ref 3.87–5.11)
RDW: 16.8 % — ABNORMAL HIGH (ref 11.5–15.5)
WBC: 7.8 10*3/uL (ref 4.0–10.5)

## 2016-10-19 MED ORDER — SODIUM CHLORIDE 0.9 % IV SOLN
Freq: Once | INTRAVENOUS | Status: AC
  Administered 2016-10-19: 12:00:00 via INTRAVENOUS

## 2016-10-19 MED ORDER — NIVOLUMAB CHEMO INJECTION 100 MG/10ML
240.0000 mg | Freq: Once | INTRAVENOUS | Status: AC
  Administered 2016-10-19: 240 mg via INTRAVENOUS
  Filled 2016-10-19: qty 20

## 2016-10-19 MED ORDER — HEPARIN SOD (PORK) LOCK FLUSH 100 UNIT/ML IV SOLN
500.0000 [IU] | Freq: Once | INTRAVENOUS | Status: AC | PRN
  Administered 2016-10-19: 500 [IU]

## 2016-10-19 MED ORDER — SODIUM CHLORIDE 0.9 % IJ SOLN
10.0000 mL | INTRAMUSCULAR | Status: DC | PRN
  Administered 2016-10-19: 10 mL
  Filled 2016-10-19: qty 10

## 2016-10-19 MED ORDER — OXYCODONE HCL 10 MG PO TABS: 10.0000 mg | ORAL_TABLET | Freq: Four times a day (QID) | ORAL | 0 refills | Status: DC | PRN

## 2016-10-19 NOTE — Patient Instructions (Addendum)
[place] at [facility] Discharge Instructions  RECOMMENDATIONS MADE BY THE CONSULTANT AND ANY TEST RESULTS WILL BE SENT TO YOUR REFERRING PHYSICIAN.  Return to clinic in 4 weeks with labs/chemo  Return to clinic 2 weeks for labs/chemo    Thank you for choosing [place] at [facility] to provide your oncology and hematology care.  To afford each patient quality time with our provider, please arrive at least 15 minutes before your scheduled appointment time.   Beginning January 23rd 2017 lab work for the [facility] will be done in the  Main lab at [facility] on 1st floor. If you have a lab appointment with the [place] please come in thru the  Main Entrance and check in at the main information desk  You need to re-schedule your appointment should you arrive 10 or more minutes late.  We strive to give you quality time with our providers, and arriving late affects you and other patients whose appointments are after yours.  Also, if you no show three or more times for appointments you may be dismissed from the clinic at the providers discretion.     Again, thank you for choosing [facility].  Our hope is that these requests will decrease the amount of time that you wait before being seen by our physicians.       _____________________________________________________________  Should you have questions after your visit to [facility], please contact our office at [phone] between the hours of 8:30 a.m. and 4:30 p.m.  Voicemails left after 4:30 p.m. will not be returned until the following business day.  For prescription refill requests, have your pharmacy contact our office.         Resources For Cancer Patients and their Caregivers ? American Cancer Society: Can assist with transportation, wigs, general needs, runs Look Good Feel Better.        [phone] ? Cancer Care: Provides financial  assistance, online support groups, medication/co-pay assistance.  1-800-813-HOPE 754-129-5697) ? [place] Assists [place] cancer patients and their families through emotional , educational and financial support.  306-664-6254 ? [place] DSS Where to apply for food stamps, Medicaid and utility assistance. 989-144-4783 ? RCATS: Transportation to medical appointments. 719-021-7588 ? Social Security Administration: May apply for disability if have a Stage IV cancer. [phone] ? [employer], Disability and Transit Services: Assists with nutrition, care and transit needs. [place] Support Programs: '@10RELATIVEDAYS'$ @ > Cancer Support Group  2nd Tuesday of the month 1pm-2pm, Journey Room  > Creative Journey  3rd Tuesday of the month 1130am-1pm, Journey Room  > Look Good Feel Better  1st Wednesday of the month 10am-12 noon, Journey Room (Call [place] to register [phone])

## 2016-10-19 NOTE — Patient Instructions (Signed)
[facility] Discharge Instructions for Patients Receiving Chemotherapy   Beginning January 23rd 2017 lab work for the [facility] will be done in the  Main lab at [place] on 1st floor. If you have a lab appointment with the Cancer Center please come in thru the  Main Entrance and check in at the main information desk   Today you received the following chemotherapy agents Opdivo  To help prevent nausea and vomiting after your treatment, we encourage you to take your nausea medication   If you develop nausea and vomiting, or diarrhea that is not controlled by your medication, call the clinic.  The clinic phone number is [phone]. Office hours are Monday-Friday 8:30am-5:00pm.  BELOW ARE SYMPTOMS THAT SHOULD BE REPORTED IMMEDIATELY:  *FEVER GREATER THAN 101.0 F  *CHILLS WITH OR WITHOUT FEVER  NAUSEA AND VOMITING THAT IS NOT CONTROLLED WITH YOUR NAUSEA MEDICATION  *UNUSUAL SHORTNESS OF BREATH  *UNUSUAL BRUISING OR BLEEDING  TENDERNESS IN MOUTH AND THROAT WITH OR WITHOUT PRESENCE OF ULCERS  *URINARY PROBLEMS  *BOWEL PROBLEMS  UNUSUAL RASH Items with * indicate a potential emergency and should be followed up as soon as possible. If you have an emergency after office hours please contact your primary care physician or go to the nearest emergency department.  Please call the clinic during office hours if you have any questions or concerns.   You may also contact the Patient Navigator at [phone] should you have any questions or need assistance in obtaining follow up care.      Resources For Cancer Patients and their Caregivers ? American Cancer Society: Can assist with transportation, wigs, general needs, runs Look Good Feel Better.        1-888-227-6333 ? Cancer Care: Provides financial assistance, online support groups, medication/co-pay assistance.  1-800-813-HOPE (4673) ? [facility] Assists [place] cancer  patients and their families through emotional , educational and financial support.  336-427-4357 ? [place] DSS Where to apply for food stamps, Medicaid and utility assistance. 336-342-1394 ? RCATS: Transportation to medical appointments. [phone] ? Social Security Administration: May apply for disability if have a Stage IV cancer. [phone] 1-800-772-1213 ? [place] A[employer], Disability and Transit Services: Assists with nutrition, care and transit needs. [phone]          

## 2016-10-19 NOTE — Progress Notes (Signed)
PROGRESS NOTE  Melissa Sullivan Tumor, stage IV, bilateral adrenal metastases, retroperitoneal lymph nodes  Biopsy on 08/01/2014 c/w poorly differentiated adenocarcinoma  XRT completed on 10/27/2014  Right Brachial plexus neuralgia  EGFR mutation negative ALK mutation negative    Cancer of upper lobe of right lung ([name])   07/28/2014 Imaging    CT chest: Large Melissa apical mass consistent with malignancy. This is destroying the Melissa 2nd rib with extension into adjacent soft tissue. Melissa hilar adenopathy with Melissa 5cm adrenal metastatic lesion.      08/01/2014 Initial Biopsy    Lung, needle/core biopsy(ies), right upper lobe - POORLY DIFFERENTIATED ADENOCARCINOMA, SEE COMMENT.      08/08/2014 PET scan    Large hypermetabolic Melissa apical mass with evidence of direct chest wall and mediastinal invasion, right retrocrural lymphadenopathy, extensive retroperitoneal lymphadenopathy, and metastatic lesions to the adrenal glands       09/02/2014 - 11/04/2014 Chemotherapy    Cisplatin/Pemetrexed/Avastin every 21 days x 4 cycles      10/07/2014 - 10/27/2014 Radiation Therapy    Right lung apex for control of brachioplexopathy.      12/24/2014 - 02/25/2015 Chemotherapy    Alimta/Avastin every 21 days.      02/20/2015 Imaging    Increase in size of right adrenal metastasis and subjacent confluent retrocaval lymphadenopathy      02/25/2015 -  Chemotherapy    Nivolumab, zometa      05/04/2015 Imaging    CT CAP- Stable to slight decrease in the posterior right apical lesion. Stable appearance of posterior right upper rib an upper thoracic bony lesions. Slight improvement in right upper lobe tree-in-bud opacity. No new or progressive findings in...      07/28/2015 Imaging    CT CAP- Reduced size of the right apical pleural parenchymal lesion and reduced size of the right adrenal metastatic lesion. Resolution of prior retrocrural adenopathy.  Right eccentric T1 and T2 sclerosis with sclerosis and tapering of the  right second..      11/17/2015 Imaging    CT CAP- Stable soft tissue thickening in the apex of the right hemi thorax. Stable right adrenal metastasis. Nodularity along the trachea and mainstem bronchi, relatively new from 07/28/2015, favoring adherent debris.      11/18/2015 Treatment Plan Change    Zometa HELD for upcoming tooth extraction      11/24/2015 Treatment Plan Change    Zometa on hold at this time in preparation for tooth extraction in March 2017.  Zometa las given on 11/18/2015.      02/03/2016 Imaging    CT CAP- Heterogeneous right apical masslike consolidation and right adrenal metastasis are unchanged      04/06/2016 Treatment Plan Change    Zometa restarted 6 weeks out from tooth extraction (04/06/2016)      05/12/2016 Imaging    CT CAP- NED in the chest, abdomen or pelvis.  Some areas of nodularity associated with the mainstem bronchi in the left upper lobe bronchus, favored to represent adherent inspissated secretions      08/17/2016 Imaging    CT CAP- 1. Stable CTs of the chest and abdomen. No evidence of progressive metastatic disease. 2. Probable treated tumor at the right apex, right adrenal gland and T2 vertebral body, stable. 3. Fluctuating nodularity along the walls of the trachea and mainstem bronchi, likely secretions.        CURRENT THERAPY: Nivolumab/zometa  INTERVAL HISTORY: [name] 60 y.o. female returns for follow-up of stage IV  adenocarcinoma of the lung. She is doing well.  She sleeps ok. She denies any pain. Her mood is good. She continues on single agent nivolumab.   Melissa Sullivan is accompanied by her mother and daughter. She presents in the treatment bed for cycle 29 of lung nivolumab.  She says she feels great today. Appetite is good. Energy is good. She is active. She is doing all of her ADL' s without difficulty.   I have reviewed the labs with the patient. Results are detailed below.   She denies any more teeth problems. She  hasn't had any more extractions since our last visit.   She denies any other complaints.  MEDICAL HISTORY: Past Medical History:  Diagnosis Date  . Adrenal mass, right ([place]) 07/28/2014  . Bone metastases ([place]) 04/05/2016  . Cancer ([place])    lung  right  . Diabetes mellitus without complication ([place])   . Hyperlipidemia   . Hypertension   . Lung mass 07/28/2014  . Reflux     has Adrenal mass, right ([place]); Cancer of upper lobe of right lung ([place]); ARF (acute renal failure) ([place]); Abnormal finding on imaging; Oropharyngeal dysphagia; Nausea and vomiting; Hypercalcemia; Anemia in neoplastic disease; Dysphagia; Encounter for screening colonoscopy; Bone metastases ([place]); Portacath in place; and Tobacco use on her problem list.      has No Known Allergies.  Melissa Sullivan does not currently have medications on file.  SURGICAL HISTORY: Past Surgical History:  Procedure Laterality Date  . APPENDECTOMY    . ESOPHAGOGASTRODUODENOSCOPY N/A 12/09/2014   MHD:QQIWLN esophageal stricture/mild-to-noderate erosive gastritis. negative H.pylori  . FLEXIBLE SIGMOIDOSCOPY  2011   Dr. [name]: hyperplastic polyp  . LUNG BIOPSY Right 07/2014   CT guided  . MALONEY DILATION N/A 12/09/2014   Procedure: [name] DILATION;  Surgeon: [name], MD;  Location: [facility];  Service: Endoscopy;  Laterality: N/A;  . PORTACATH PLACEMENT Left 09/01/2014  . SAVORY DILATION N/A 12/09/2014   Procedure: SAVORY DILATION;  Surgeon: [name], MD;  Location: [facility];  Service: Endoscopy;  Laterality: N/A;    SOCIAL HISTORY: Social History   Social History  . Marital status: Married    Spouse name: N/A  . Number of children: N/A  . Years of education: N/A   Occupational History  . Not on file.   Social History Main Topics  . Smoking status: Light Tobacco Smoker    Packs/day: 0.30    Types: Cigarettes  . Smokeless tobacco: Never Used  . Alcohol use No  . Drug use: No  . Sexual activity: Not on  file   Other Topics Concern  . Not on file   Social History Narrative  . No narrative on file    FAMILY HISTORY: Family History  Problem Relation Age of Onset  . Cancer Sister     Review of Systems  Constitutional: Negative.   HENT: Negative.        Denies teeth problems   Eyes: Negative.   Respiratory: Negative.   Cardiovascular: Negative.   Gastrointestinal: Negative.   Genitourinary: Negative.   Musculoskeletal: Negative.   Skin: Negative.   Neurological: Negative.   Endo/Heme/Allergies: Negative.   Psychiatric/Behavioral: Negative.   All other systems reviewed and are negative. 14 point review of systems was performed and is negative except as detailed under history of present illness and above   PHYSICAL EXAMINATION  ECOG PERFORMANCE STATUS: 1 - Symptomatic but completely ambulatory  Vitals:   10/19/16 1310  Temp:  98.8 F (37.1 C)   Vitals with BMI 10/19/2016  Height   Weight 92 lbs  BMI   Systolic 979  Diastolic 60  Pulse 90  Respirations 16    Physical Exam  Constitutional: She is oriented to person, place, and time and well-developed, well-nourished, and in no distress.  HENT:  Head: Normocephalic and atraumatic.  Nose: Nose normal.  Mouth/Throat: Oropharynx is clear and moist. No oropharyngeal exudate.  Eyes: Conjunctivae and EOM are normal. Pupils are equal, round, and reactive to light. Right eye exhibits no discharge. Left eye exhibits no discharge. No scleral icterus.  Neck: Normal range of motion. Neck supple. No tracheal deviation present. No thyromegaly present.  Cardiovascular: Normal rate, regular rhythm and normal heart sounds.  Exam reveals no gallop and no friction rub.   No murmur heard. Pulmonary/Chest: Effort normal and breath sounds normal. She has no wheezes. She has no rales.  Abdominal: Soft. Bowel sounds are normal. She exhibits no distension and no mass. There is no tenderness. There is no rebound and no guarding.    Musculoskeletal: Normal range of motion. She exhibits no edema.  Lymphadenopathy:    She has no cervical adenopathy.  Neurological: She is alert and oriented to person, place, and time. She has normal reflexes. No cranial nerve deficit. Gait normal. Coordination normal.  Skin: Skin is warm and dry. No rash noted.  Psychiatric: Mood, memory, affect and judgment normal.  Nursing note and vitals reviewed.    LABORATORY DATA: I have reviewed the data below as listed. CBC    Component Value Date/Time   WBC 7.8 10/19/2016 1138   RBC 3.51 (L) 10/19/2016 1138   HGB 10.7 (L) 10/19/2016 1138   HCT 33.1 (L) 10/19/2016 1138   PLT 290 10/19/2016 1138   MCV 94.3 10/19/2016 1138   MCH 30.5 10/19/2016 1138   MCHC 32.3 10/19/2016 1138   RDW 16.8 (H) 10/19/2016 1138   LYMPHSABS 2.0 10/19/2016 1138   MONOABS 0.6 10/19/2016 1138   EOSABS 0.2 10/19/2016 1138   BASOSABS 0.0 10/19/2016 1138    Results for ANUM, PALECEK (MRN [id number]) as of 10/19/2016 13:37  Ref. Range 10/19/2016 11:38  Sodium Latest Ref Range: 135 - 145 mmol/L 134 (L)  Potassium Latest Ref Range: 3.5 - 5.1 mmol/L 4.1  Chloride Latest Ref Range: 101 - 111 mmol/L 105  CO2 Latest Ref Range: 22 - 32 mmol/L 23  BUN Latest Ref Range: 6 - 20 mg/dL 17  Creatinine Latest Ref Range: 0.44 - 1.00 mg/dL 1.35 (H)  Calcium Latest Ref Range: 8.9 - 10.3 mg/dL 9.9  EGFR (Non-African Amer.) Latest Ref Range: >60 mL/min 42 (L)  EGFR (African American) Latest Ref Range: >60 mL/min 48 (L)  Glucose Latest Ref Range: 65 - 99 mg/dL 93  Anion gap Latest Ref Range: 5 - 15  6  Alkaline Phosphatase Latest Ref Range: 38 - 126 U/L 44  Albumin Latest Ref Range: 3.5 - 5.0 g/dL 3.8  AST Latest Ref Range: 15 - 41 U/L 15  ALT Latest Ref Range: 14 - 54 U/L 10 (L)  Total Protein Latest Ref Range: 6.5 - 8.1 g/dL 7.2  Total Bilirubin Latest Ref Range: 0.3 - 1.2 mg/dL 0.2 (L)  WBC Latest Ref Range: 4.0 - 10.5 K/uL 7.8  RBC Latest Ref Range: 3.87 - 5.11  MIL/uL 3.51 (L)  Hemoglobin Latest Ref Range: 12.0 - 15.0 g/dL 10.7 (L)  HCT Latest Ref Range: 36.0 - 46.0 % 33.1 (L)  MCV Latest  Ref Range: 78.0 - 100.0 fL 94.3  MCH Latest Ref Range: 26.0 - 34.0 pg 30.5  MCHC Latest Ref Range: 30.0 - 36.0 g/dL 32.3  RDW Latest Ref Range: 11.5 - 15.5 % 16.8 (H)  Platelets Latest Ref Range: 150 - 400 K/uL 290  Neutrophils Latest Units: % 63  Lymphocytes Latest Units: % 26  Monocytes Relative Latest Units: % 8  Eosinophil Latest Units: % 3  Basophil Latest Units: % 0  NEUT# Latest Ref Range: 1.7 - 7.7 K/uL 5.0  Lymphocyte # Latest Ref Range: 0.7 - 4.0 K/uL 2.0  Monocyte # Latest Ref Range: 0.1 - 1.0 K/uL 0.6  Eosinophils Absolute Latest Ref Range: 0.0 - 0.7 K/uL 0.2  Basophils Absolute Latest Ref Range: 0.0 - 0.1 K/uL 0.0    RADIOLOGY: I have personally reviewed the radiological images as listed and agreed with the findings in the report.Study Result   CLINICAL DATA:  Right lung cancer post radiation therapy. Chemotherapy ongoing.  EXAM: CT CHEST AND ABDOMEN WITH CONTRAST  TECHNIQUE: Multidetector CT imaging of the chest and abdomen was performed following the standard protocol during bolus administration of intravenous contrast.  CONTRAST:  76m ISOVUE-300 IOPAMIDOL (ISOVUE-300) INJECTION 61%  COMPARISON:  CT 05/12/2016 and 02/03/2016.  FINDINGS: CT CHEST FINDINGS  Cardiovascular: Stable atherosclerosis of the aorta, great vessels and coronary arteries. There is a left IJ Port-A-Cath which terminates in the upper SVC. The heart size is normal. There is no pericardial effusion.  Mediastinum/Nodes: There are no enlarged mediastinal, hilar or axillary lymph nodes. The thyroid gland, trachea and esophagus demonstrate no significant findings.  Lungs/Pleura: There is no pleural effusion. The partially calcified, mass-like density at the right apex appears unchanged. This remains difficult to measure given its irregular shape,  although measures approximately 3.6 x 2.8 cm on image 21. No new or enlarging pulmonary nodules are present. There is emphysema with scattered subpleural reticulation and mild bronchiectasis. Small areas of mucous plugging are present in both upper lobes. Fluctuating nodularity along the walls of the trachea and proximal bronchi are again noted, most consistent with adherent secretions. No fixed nodules are seen to suggest neoplasm.  Musculoskeletal/Chest wall: Stable sclerosis of the T2 vertebral body, probably a treated metastasis. Stable sclerosis of the upper right-sided ribs. No new lytic or blastic lesions are identified. No evidence of chest wall lesion.  CT ABDOMEN FINDINGS  Hepatobiliary: The liver is normal in density without focal abnormality. No evidence of gallstones, gallbladder wall thickening or biliary dilatation.  Pancreas: Unremarkable. No pancreatic ductal dilatation or surrounding inflammatory changes.  Spleen: Normal in size without focal abnormality.  Adrenals/Urinary Tract: Complex, partially calcified mass involving the right adrenal gland is again noted. This has a thin neck extending posterior to the IVC. As measured in a similar fashion to the prior examination, this appears unchanged, measuring approximately 3.7 x 2.4 cm on image 54. The left adrenal gland appears normal. Both kidneys appear normal. No evidence of renal mass, hydronephrosis or ureteral calculus. Bladder not imaged.  Stomach/Bowel: Suggested wall thickening of the distal stomach, likely due to peristalsis. No surrounding abnormality. No evidence of bowel wall thickening, distention or surrounding inflammatory change.  Vascular/Lymphatic: Stable aortic and branch vessel atherosclerosis. No enlarged abdominal pelvic lymph nodes.  Other: No ascites or peritoneal nodularity. Stable small umbilical hernia containing only fat.  Musculoskeletal: No acute or significant  osseous findings.  IMPRESSION: 1. Stable CTs of the chest and abdomen. No evidence of progressive metastatic disease. 2. Probable treated  tumor at the right apex, right adrenal gland and T2 vertebral body, stable. 3. Fluctuating nodularity along the walls of the trachea and mainstem bronchi, likely secretions. 4.  Emphysema. (ICD10-J43.9) 5.  Aortic Atherosclerosis (ICD10-170.0)   Electronically Signed   By: Richardean Sale M.D.   On: 08/17/2016 15:37     ASSESSMENT and THERAPY PLAN:   Stage IV adenocarcinoma of the lung, EGFR and ALK negative  She is certainly doing well clinically. CT scans have shown stable disease   We will continue with current therapy. She has absolutely no major complaints today. Again reviewed the potential side effects of her current treatment. She will proceed with Nivolumab today.  We will arrange for repeat imaging after the Holiday.   Hypercalcemia/bony metastatic disease  Zometa was last given in August secondary to dental issues. Will re-institute therapy given bone metastases.   Tobacco ABUSE  I re-emphasized the benefits of smoking cessation. I have offered her methods to quit in the past. She has cut back significantly.  She needs a refill of her pain medication.  She will return for treatment and a follow up in 2 weeks.  Meds ordered this encounter  Medications  . Oxycodone HCl 10 MG TABS    Sig: Take 1-2 tablets (10-20 mg total) by mouth every 6 (six) hours as needed (pain.).    Dispense:  100 tablet    Refill:  0   All questions were answered. The patient knows to call the clinic with any problems, questions or concerns. We can certainly see the patient much sooner if necessary.   This document serves as a record of services personally performed by Ancil Linsey, MD. It was created on her behalf by Martinique Casey, a trained medical scribe. The creation of this record is based on the scribe's personal observations and the  provider's statements to them. This document has been checked and approved by the attending provider.  I have reviewed the above documentation for accuracy and completeness, and I agree with the above.  This note was signed electronically  Larene Beach K. Whitney Muse, MD

## 2016-10-19 NOTE — Progress Notes (Signed)
Opdivo given today per orders, no problems. Patient tolerated it well. Vitals stable, discharged from clinic ambulatory with family.

## 2016-10-21 ENCOUNTER — Encounter (HOSPITAL_COMMUNITY): Payer: Self-pay | Admitting: Hematology & Oncology

## 2016-11-02 ENCOUNTER — Encounter (HOSPITAL_COMMUNITY): Payer: Self-pay

## 2016-11-02 ENCOUNTER — Other Ambulatory Visit (HOSPITAL_COMMUNITY): Payer: Self-pay | Admitting: Oncology

## 2016-11-02 ENCOUNTER — Encounter (HOSPITAL_COMMUNITY): Payer: 59 | Attending: Hematology and Oncology

## 2016-11-02 VITALS — BP 122/54 | HR 74 | Temp 99.0°F | Resp 18 | Wt 91.0 lb

## 2016-11-02 DIAGNOSIS — C7972 Secondary malignant neoplasm of left adrenal gland: Secondary | ICD-10-CM | POA: Insufficient documentation

## 2016-11-02 DIAGNOSIS — Z5112 Encounter for antineoplastic immunotherapy: Secondary | ICD-10-CM | POA: Diagnosis not present

## 2016-11-02 DIAGNOSIS — Z95828 Presence of other vascular implants and grafts: Secondary | ICD-10-CM

## 2016-11-02 DIAGNOSIS — Z452 Encounter for adjustment and management of vascular access device: Secondary | ICD-10-CM | POA: Diagnosis not present

## 2016-11-02 DIAGNOSIS — C772 Secondary and unspecified malignant neoplasm of intra-abdominal lymph nodes: Secondary | ICD-10-CM | POA: Insufficient documentation

## 2016-11-02 DIAGNOSIS — E785 Hyperlipidemia, unspecified: Secondary | ICD-10-CM | POA: Diagnosis not present

## 2016-11-02 DIAGNOSIS — F17210 Nicotine dependence, cigarettes, uncomplicated: Secondary | ICD-10-CM | POA: Diagnosis not present

## 2016-11-02 DIAGNOSIS — C3411 Malignant neoplasm of upper lobe, right bronchus or lung: Secondary | ICD-10-CM

## 2016-11-02 DIAGNOSIS — I10 Essential (primary) hypertension: Secondary | ICD-10-CM | POA: Insufficient documentation

## 2016-11-02 DIAGNOSIS — Z79899 Other long term (current) drug therapy: Secondary | ICD-10-CM | POA: Diagnosis not present

## 2016-11-02 DIAGNOSIS — E278 Other specified disorders of adrenal gland: Secondary | ICD-10-CM

## 2016-11-02 DIAGNOSIS — J449 Chronic obstructive pulmonary disease, unspecified: Secondary | ICD-10-CM | POA: Insufficient documentation

## 2016-11-02 DIAGNOSIS — C7989 Secondary malignant neoplasm of other specified sites: Secondary | ICD-10-CM | POA: Insufficient documentation

## 2016-11-02 DIAGNOSIS — C781 Secondary malignant neoplasm of mediastinum: Secondary | ICD-10-CM | POA: Diagnosis not present

## 2016-11-02 DIAGNOSIS — K219 Gastro-esophageal reflux disease without esophagitis: Secondary | ICD-10-CM | POA: Diagnosis not present

## 2016-11-02 DIAGNOSIS — C7971 Secondary malignant neoplasm of right adrenal gland: Secondary | ICD-10-CM | POA: Insufficient documentation

## 2016-11-02 DIAGNOSIS — Z9089 Acquired absence of other organs: Secondary | ICD-10-CM | POA: Insufficient documentation

## 2016-11-02 LAB — COMPREHENSIVE METABOLIC PANEL
ALT: 9 U/L — AB (ref 14–54)
ANION GAP: 7 (ref 5–15)
AST: 14 U/L — ABNORMAL LOW (ref 15–41)
Albumin: 4.3 g/dL (ref 3.5–5.0)
Alkaline Phosphatase: 43 U/L (ref 38–126)
BUN: 15 mg/dL (ref 6–20)
CHLORIDE: 105 mmol/L (ref 101–111)
CO2: 21 mmol/L — AB (ref 22–32)
Calcium: 10.5 mg/dL — ABNORMAL HIGH (ref 8.9–10.3)
Creatinine, Ser: 1.6 mg/dL — ABNORMAL HIGH (ref 0.44–1.00)
GFR calc non Af Amer: 34 mL/min — ABNORMAL LOW (ref >60)
GFR, EST AFRICAN AMERICAN: 39 mL/min — AB (ref >60)
GLUCOSE: 87 mg/dL (ref 65–99)
Potassium: 4.3 mmol/L (ref 3.5–5.1)
SODIUM: 133 mmol/L — AB (ref 135–145)
Total Bilirubin: 0.3 mg/dL (ref 0.3–1.2)
Total Protein: 8.1 g/dL (ref 6.5–8.1)

## 2016-11-02 LAB — CBC WITH DIFFERENTIAL/PLATELET
BASOS PCT: 0 %
Basophils Absolute: 0 10*3/uL (ref 0.0–0.1)
EOS ABS: 0.1 10*3/uL (ref 0.0–0.7)
EOS PCT: 1 %
HCT: 36.4 % (ref 36.0–46.0)
HEMOGLOBIN: 12.1 g/dL (ref 12.0–15.0)
LYMPHS ABS: 3.2 10*3/uL (ref 0.7–4.0)
Lymphocytes Relative: 28 %
MCH: 31.1 pg (ref 26.0–34.0)
MCHC: 33.2 g/dL (ref 30.0–36.0)
MCV: 93.6 fL (ref 78.0–100.0)
Monocytes Absolute: 1.1 10*3/uL — ABNORMAL HIGH (ref 0.1–1.0)
Monocytes Relative: 9 %
NEUTROS PCT: 62 %
Neutro Abs: 7.1 10*3/uL (ref 1.7–7.7)
PLATELETS: 363 10*3/uL (ref 150–400)
RBC: 3.89 MIL/uL (ref 3.87–5.11)
RDW: 16.6 % — ABNORMAL HIGH (ref 11.5–15.5)
WBC: 11.5 10*3/uL — AB (ref 4.0–10.5)

## 2016-11-02 LAB — TSH: TSH: 1.972 u[IU]/mL (ref 0.350–4.500)

## 2016-11-02 MED ORDER — ALTEPLASE 2 MG IJ SOLR
2.0000 mg | Freq: Once | INTRAMUSCULAR | Status: AC | PRN
  Administered 2016-11-02: 2 mg

## 2016-11-02 MED ORDER — NIVOLUMAB CHEMO INJECTION 100 MG/10ML
240.0000 mg | Freq: Once | INTRAVENOUS | Status: AC
  Administered 2016-11-02: 240 mg via INTRAVENOUS
  Filled 2016-11-02: qty 4

## 2016-11-02 MED ORDER — SODIUM CHLORIDE 0.9 % IJ SOLN: 10.0000 mL | INTRAMUSCULAR | Status: DC | PRN

## 2016-11-02 MED ORDER — SODIUM CHLORIDE 0.9 % IV SOLN
Freq: Once | INTRAVENOUS | Status: AC
  Administered 2016-11-02: 13:00:00 via INTRAVENOUS

## 2016-11-02 MED ORDER — ALTEPLASE 2 MG IJ SOLR
INTRAMUSCULAR | Status: AC
  Filled 2016-11-02: qty 2

## 2016-11-02 MED ORDER — STERILE WATER FOR INJECTION IJ SOLN
INTRAMUSCULAR | Status: AC
  Filled 2016-11-02: qty 10

## 2016-11-02 MED ORDER — HEPARIN SOD (PORK) LOCK FLUSH 100 UNIT/ML IV SOLN
500.0000 [IU] | Freq: Once | INTRAVENOUS | Status: AC | PRN
  Administered 2016-11-02: 500 [IU]
  Filled 2016-11-02: qty 5

## 2016-11-02 NOTE — Patient Instructions (Signed)
[facility] Discharge Instructions for Patients Receiving Chemotherapy   Beginning January 23rd 2017 lab work for the Cancer Center will be done in the  Main lab at [facility] on 1st floor. If you have a lab appointment with the Cancer Center please come in thru the  Main Entrance and check in at the main information desk   Today you received the following chemotherapy agents:  Opdivo  If you develop nausea and vomiting, or diarrhea that is not controlled by your medication, call the clinic.  The clinic phone number is [phone]. Office hours are Monday-Friday 8:30am-5:00pm.  BELOW ARE SYMPTOMS THAT SHOULD BE REPORTED IMMEDIATELY:  *FEVER GREATER THAN 101.0 F  *CHILLS WITH OR WITHOUT FEVER  NAUSEA AND VOMITING THAT IS NOT CONTROLLED WITH YOUR NAUSEA MEDICATION  *UNUSUAL SHORTNESS OF BREATH  *UNUSUAL BRUISING OR BLEEDING  TENDERNESS IN MOUTH AND THROAT WITH OR WITHOUT PRESENCE OF ULCERS  *URINARY PROBLEMS  *BOWEL PROBLEMS  UNUSUAL RASH Items with * indicate a potential emergency and should be followed up as soon as possible. If you have an emergency after office hours please contact your primary care physician or go to the nearest emergency department.  Please call the clinic during office hours if you have any questions or concerns.   You may also contact the Patient Navigator at [phone] should you have any questions or need assistance in obtaining follow up care.      Resources For Cancer Patients and their Caregivers ? American Cancer Society: Can assist with transportation, wigs, general needs, runs Look Good Feel Better.        1-888-227-6333 ? Cancer Care: Provides financial assistance, online support groups, medication/co-pay assistance.  1-800-813-HOPE (4673) ? [facility] Assists [place] cancer patients and their families through emotional , educational and financial support.   336-427-4357 ? [place] DSS Where to apply for food stamps, Medicaid and utility assistance. 336-342-1394 ? RCATS: Transportation to medical appointments. 336-347-2287 ? Social Security Administration: May apply for disability if have a Stage IV cancer. 336-342-7796 1-800-772-1213 ? [place] Aging, Disability and Transit Services: Assists with nutrition, care and transit needs. 336-349-2343         

## 2016-11-02 NOTE — Treatment Plan (Signed)
Ok to treat today with Sr C 1.6 per Dr Whitney Muse

## 2016-11-02 NOTE — Progress Notes (Signed)
1230:  Port reaccessed after Alteplase instillation - blood return of 10 cc withdrawn and discarded.   Tolerated infusion w/o adverse reaction.  Alert, in no distress.  VSS.  Discharged ambulatory.

## 2016-11-16 ENCOUNTER — Encounter (HOSPITAL_BASED_OUTPATIENT_CLINIC_OR_DEPARTMENT_OTHER): Payer: 59

## 2016-11-16 ENCOUNTER — Encounter (HOSPITAL_BASED_OUTPATIENT_CLINIC_OR_DEPARTMENT_OTHER): Payer: 59 | Admitting: Hematology & Oncology

## 2016-11-16 ENCOUNTER — Encounter (HOSPITAL_COMMUNITY): Payer: Self-pay | Admitting: Hematology & Oncology

## 2016-11-16 VITALS — BP 101/53 | HR 88 | Temp 98.6°F | Resp 18

## 2016-11-16 VITALS — BP 113/67 | HR 86 | Temp 98.5°F | Resp 16 | Wt 93.6 lb

## 2016-11-16 DIAGNOSIS — Z5112 Encounter for antineoplastic immunotherapy: Secondary | ICD-10-CM | POA: Diagnosis not present

## 2016-11-16 DIAGNOSIS — C7951 Secondary malignant neoplasm of bone: Secondary | ICD-10-CM

## 2016-11-16 DIAGNOSIS — C3411 Malignant neoplasm of upper lobe, right bronchus or lung: Secondary | ICD-10-CM

## 2016-11-16 DIAGNOSIS — Z720 Tobacco use: Secondary | ICD-10-CM

## 2016-11-16 DIAGNOSIS — E278 Other specified disorders of adrenal gland: Secondary | ICD-10-CM

## 2016-11-16 LAB — COMPREHENSIVE METABOLIC PANEL
ALBUMIN: 3.8 g/dL (ref 3.5–5.0)
ALT: 11 U/L — ABNORMAL LOW (ref 14–54)
ANION GAP: 6 (ref 5–15)
AST: 15 U/L (ref 15–41)
Alkaline Phosphatase: 56 U/L (ref 38–126)
BILIRUBIN TOTAL: 0.5 mg/dL (ref 0.3–1.2)
BUN: 13 mg/dL (ref 6–20)
CO2: 21 mmol/L — ABNORMAL LOW (ref 22–32)
Calcium: 9.3 mg/dL (ref 8.9–10.3)
Chloride: 107 mmol/L (ref 101–111)
Creatinine, Ser: 1.35 mg/dL — ABNORMAL HIGH (ref 0.44–1.00)
GFR calc Af Amer: 48 mL/min — ABNORMAL LOW (ref >60)
GFR calc non Af Amer: 42 mL/min — ABNORMAL LOW (ref >60)
GLUCOSE: 72 mg/dL (ref 65–99)
POTASSIUM: 3.9 mmol/L (ref 3.5–5.1)
Sodium: 134 mmol/L — ABNORMAL LOW (ref 135–145)
TOTAL PROTEIN: 7.6 g/dL (ref 6.5–8.1)

## 2016-11-16 LAB — CBC WITH DIFFERENTIAL/PLATELET
BASOS ABS: 0 10*3/uL (ref 0.0–0.1)
BASOS PCT: 0 %
EOS ABS: 0.2 10*3/uL (ref 0.0–0.7)
EOS PCT: 2 %
HEMATOCRIT: 32.2 % — AB (ref 36.0–46.0)
Hemoglobin: 11 g/dL — ABNORMAL LOW (ref 12.0–15.0)
Lymphocytes Relative: 26 %
Lymphs Abs: 2.1 10*3/uL (ref 0.7–4.0)
MCH: 32.1 pg (ref 26.0–34.0)
MCHC: 34.2 g/dL (ref 30.0–36.0)
MCV: 93.9 fL (ref 78.0–100.0)
MONO ABS: 0.6 10*3/uL (ref 0.1–1.0)
MONOS PCT: 8 %
NEUTROS ABS: 5.1 10*3/uL (ref 1.7–7.7)
Neutrophils Relative %: 64 %
PLATELETS: 263 10*3/uL (ref 150–400)
RBC: 3.43 MIL/uL — ABNORMAL LOW (ref 3.87–5.11)
RDW: 15.8 % — AB (ref 11.5–15.5)
WBC: 7.9 10*3/uL (ref 4.0–10.5)

## 2016-11-16 MED ORDER — DENOSUMAB 120 MG/1.7ML ~~LOC~~ SOLN
120.0000 mg | Freq: Once | SUBCUTANEOUS | Status: AC
  Administered 2016-11-16: 120 mg via SUBCUTANEOUS
  Filled 2016-11-16: qty 1.7

## 2016-11-16 MED ORDER — SODIUM CHLORIDE 0.9 % IV SOLN
Freq: Once | INTRAVENOUS | Status: AC
  Administered 2016-11-16: 10:00:00 via INTRAVENOUS

## 2016-11-16 MED ORDER — SIMVASTATIN 40 MG PO TABS
40.0000 mg | ORAL_TABLET | ORAL | 6 refills | Status: DC
Start: 2016-11-16 — End: 2017-07-17

## 2016-11-16 MED ORDER — SODIUM CHLORIDE 0.9 % IJ SOLN
10.0000 mL | INTRAMUSCULAR | Status: DC | PRN
  Administered 2016-11-16: 10 mL
  Filled 2016-11-16: qty 10

## 2016-11-16 MED ORDER — FLUTICASONE-SALMETEROL 500-50 MCG/DOSE IN AEPB: 1.0000 | INHALATION_SPRAY | Freq: Two times a day (BID) | RESPIRATORY_TRACT | 6 refills | Status: AC

## 2016-11-16 MED ORDER — HEPARIN SOD (PORK) LOCK FLUSH 100 UNIT/ML IV SOLN
500.0000 [IU] | Freq: Once | INTRAVENOUS | Status: AC | PRN
  Administered 2016-11-16: 500 [IU]

## 2016-11-16 MED ORDER — SODIUM CHLORIDE 0.9 % IV SOLN
240.0000 mg | Freq: Once | INTRAVENOUS | Status: AC
  Administered 2016-11-16: 240 mg via INTRAVENOUS
  Filled 2016-11-16: qty 4

## 2016-11-16 NOTE — Patient Instructions (Signed)
[place] at [facility] Discharge Instructions  RECOMMENDATIONS MADE BY THE CONSULTANT AND ANY TEST RESULTS WILL BE SENT TO YOUR REFERRING PHYSICIAN.   You were seen today by Dr. Youlanda Roys were given 6 refills on your Simvastatin and Advair today, they were sent to your pharmacy. Return in 2 weeks for chemo and labs CT scan January 22 Follow up for labs, chemo and visit on 1/24   Thank you for choosing [place] at [facility] to provide your oncology and hematology care.  To afford each patient quality time with our provider, please arrive at least 15 minutes before your scheduled appointment time.   Beginning January 23rd 2017 lab work for the [facility] will be done in the  Main lab at [facility] on 1st floor. If you have a lab appointment with the [facility] please come in thru the  Main Entrance and check in at the main information desk  You need to re-schedule your appointment should you arrive 10 or more minutes late.  We strive to give you quality time with our providers, and arriving late affects you and other patients whose appointments are after yours.  Also, if you no show three or more times for appointments you may be dismissed from the clinic at the providers discretion.     Again, thank you for choosing [facility].  Our hope is that these requests will decrease the amount of time that you wait before being seen by our physicians.       _____________________________________________________________  Should you have questions after your visit to [facility], please contact our office at [phone] between the hours of 8:30 a.m. and 4:30 p.m.  Voicemails left after 4:30 p.m. will not be returned until the following business day.  For prescription refill requests, have your pharmacy contact our office.         Resources For Cancer Patients and their Caregivers ? American Cancer  Society: Can assist with transportation, wigs, general needs, runs Look Good Feel Better.        657-663-3290 ? Cancer Care: Provides financial assistance, online support groups, medication/co-pay assistance.  1-800-813-HOPE 571 011 4319) ? [facility] Assists [place] cancer patients and their families through emotional , educational and financial support.  934 812 9105 ? [place] DSS Where to apply for food stamps, Medicaid and utility assistance. 367-340-4848 ? RCATS: Transportation to medical appointments. 661-458-3930 ? Social Security Administration: May apply for disability if have a Stage IV cancer. 517-472-7030 418 327 1577 ? [employer], Disability and Transit Services: Assists with nutrition, care and transit needs. [place] Support Programs: '@10RELATIVEDAYS'$ @ > Cancer Support Group  2nd Tuesday of the month 1pm-2pm, Journey Room  > Creative Journey  3rd Tuesday of the month 1130am-1pm, Journey Room  > Look Good Feel Better  1st Wednesday of the month 10am-12 noon, Journey Room (Call [place] to register [phone])

## 2016-11-16 NOTE — Progress Notes (Signed)
Continue plan of care prior to pathways. 

## 2016-11-16 NOTE — Progress Notes (Signed)
Consent done today for xgeva. Melissa Sullivan presents today for injection per MD orders. Xgeva '120mg'$  administered SQ in right Abdomen. Administration without incident. Patient tolerated well.  Opdivo given today per orders. Patient tolerated it well, no problems. Vitals stable and discharged home from clinic ambulatory.

## 2016-11-16 NOTE — Progress Notes (Signed)
Melissa Sullivan presented for labwork. Labs per MD order drawn via Peripheral Line 23 gauge needle inserted in right AC.    Good blood return present. Procedure without incident.  Needle removed intact. Patient tolerated procedure well.

## 2016-11-16 NOTE — Patient Instructions (Signed)
[facility] Discharge Instructions for Patients Receiving Chemotherapy   Beginning January 23rd 2017 lab work for the [facility] will be done in the  Main lab at [facility] on 1st floor. If you have a lab appointment with the [facility] please come in thru the  Main Entrance and check in at the main information desk   Today you received the following chemotherapy agents Opdivo, xgeva  To help prevent nausea and vomiting after your treatment, we encourage you to take your nausea medication      If you develop nausea and vomiting, or diarrhea that is not controlled by your medication, call the clinic.  The clinic phone number is [phone]. Office hours are Monday-Friday 8:30am-5:00pm.  BELOW ARE SYMPTOMS THAT SHOULD BE REPORTED IMMEDIATELY:  *FEVER GREATER THAN 101.0 F  *CHILLS WITH OR WITHOUT FEVER  NAUSEA AND VOMITING THAT IS NOT CONTROLLED WITH YOUR NAUSEA MEDICATION  *UNUSUAL SHORTNESS OF BREATH  *UNUSUAL BRUISING OR BLEEDING  TENDERNESS IN MOUTH AND THROAT WITH OR WITHOUT PRESENCE OF ULCERS  *URINARY PROBLEMS  *BOWEL PROBLEMS  UNUSUAL RASH Items with * indicate a potential emergency and should be followed up as soon as possible. If you have an emergency after office hours please contact your primary care physician or go to the nearest emergency department.  Please call the clinic during office hours if you have any questions or concerns.   You may also contact the Patient Navigator at [phone] should you have any questions or need assistance in obtaining follow up care.      Resources For Cancer Patients and their Caregivers ? American Cancer Society: Can assist with transportation, wigs, general needs, runs Look Good Feel Better.        [phone] ? Cancer Care: Provides financial assistance, online support groups, medication/co-pay assistance.  1-800-813-HOPE (865)039-8798) ? [facility] Assists [place] cancer patients and their families through emotional , educational and financial support.  4381762964 ? [place] DSS Where to apply for food stamps, Medicaid and utility assistance. 773-667-2317 ? RCATS: Transportation to medical appointments. 602-554-1246 ? Social Security Administration: May apply for disability if have a Stage IV cancer. 347-455-9213 [phone] ? [employer], Disability and Transit Services: Assists with nutrition, care and transit needs. [phone]

## 2016-11-16 NOTE — Progress Notes (Signed)
PROGRESS NOTE  [name], stage IV, bilateral adrenal metastases, retroperitoneal lymph nodes  Biopsy on 08/01/2014 c/w poorly differentiated adenocarcinoma  XRT completed on 10/27/2014  Right Brachial plexus neuralgia  EGFR mutation negative ALK mutation negative    Cancer of upper lobe of right lung ([name])   07/28/2014 Imaging    CT chest: Large R apical mass consistent with malignancy. This is destroying the R 2nd rib with extension into adjacent soft tissue. R hilar adenopathy with R 5cm adrenal metastatic lesion.      08/01/2014 Initial Biopsy    Lung, needle/core biopsy(ies), right upper lobe - POORLY DIFFERENTIATED ADENOCARCINOMA, SEE COMMENT.      08/08/2014 PET scan    Large hypermetabolic R apical mass with evidence of direct chest wall and mediastinal invasion, right retrocrural lymphadenopathy, extensive retroperitoneal lymphadenopathy, and metastatic lesions to the adrenal glands       09/02/2014 - 11/04/2014 Chemotherapy    Cisplatin/Pemetrexed/Avastin every 21 days x 4 cycles      10/07/2014 - 10/27/2014 Radiation Therapy    Right lung apex for control of brachioplexopathy.      12/24/2014 - 02/25/2015 Chemotherapy    Alimta/Avastin every 21 days.      02/20/2015 Imaging    Increase in size of right adrenal metastasis and subjacent confluent retrocaval lymphadenopathy      02/25/2015 -  Chemotherapy    Nivolumab, zometa      05/04/2015 Imaging    CT CAP- Stable to slight decrease in the posterior right apical lesion. Stable appearance of posterior right upper rib an upper thoracic bony lesions. Slight improvement in right upper lobe tree-in-bud opacity. No new or progressive findings in...      07/28/2015 Imaging    CT CAP- Reduced size of the right apical pleural parenchymal lesion and reduced size of the right adrenal metastatic lesion. Resolution of prior retrocrural adenopathy.  Right eccentric T1 and T2 sclerosis with sclerosis and tapering of the  right second..      11/17/2015 Imaging    CT CAP- Stable soft tissue thickening in the apex of the right hemi thorax. Stable right adrenal metastasis. Nodularity along the trachea and mainstem bronchi, relatively new from 07/28/2015, favoring adherent debris.      11/18/2015 Treatment Plan Change    Zometa HELD for upcoming tooth extraction      11/24/2015 Treatment Plan Change    Zometa on hold at this time in preparation for tooth extraction in March 2017.  Zometa las given on 11/18/2015.      02/03/2016 Imaging    CT CAP- Heterogeneous right apical masslike consolidation and right adrenal metastasis are unchanged      04/06/2016 Treatment Plan Change    Zometa restarted 6 weeks out from tooth extraction (04/06/2016)      05/12/2016 Imaging    CT CAP- NED in the chest, abdomen or pelvis.  Some areas of nodularity associated with the mainstem bronchi in the left upper lobe bronchus, favored to represent adherent inspissated secretions      08/17/2016 Imaging    CT CAP- 1. Stable CTs of the chest and abdomen. No evidence of progressive metastatic disease. 2. Probable treated tumor at the right apex, right adrenal gland and T2 vertebral body, stable. 3. Fluctuating nodularity along the walls of the trachea and mainstem bronchi, likely secretions.        CURRENT THERAPY: Nivolumab/zometa  INTERVAL HISTORY: Melissa Sullivan 60 y.o. female returns for follow-up of stage IV  adenocarcinoma of the lung. Melissa Sullivan is doing well.  Melissa Sullivan sleeps ok. Melissa Sullivan denies any pain. Her mood is good. Melissa Sullivan continues on single agent nivolumab.   Melissa Sullivan is accompanied by her brother, Braulio Conte. Melissa Sullivan presents for cycle 31 of lung nivolumab. Melissa Sullivan is doing well overall.  Melissa Sullivan denies anymore diarrhea or vomiting - Melissa Sullivan believes Melissa Sullivan had caught a virus from her sick grand-baby. This occurred after her last cycle of nivolumab but only lasted about 24 hours.   Melissa Sullivan had a good Christmas and ate enough to gain another  pound.   Melissa Sullivan denies any new cough. Melissa Sullivan denies leg swelling. Appetite is good. Melissa Sullivan denies headaches or blurry vision. No neurologic symptoms.   Her port is not bothering her, however the nursing staff was unable to pull blood from it.   Melissa Sullivan continues to have trouble sleeping though this is chronic and no worse. No other complaints today.     MEDICAL HISTORY: Past Medical History:  Diagnosis Date  . Adrenal mass, right ([place]) 07/28/2014  . Bone metastases ([place]) 04/05/2016  . Cancer ([place])    lung  right  . Diabetes mellitus without complication ([place])   . Hyperlipidemia   . Hypertension   . Lung mass 07/28/2014  . Reflux     has Adrenal mass, right ([place]); Cancer of upper lobe of right lung ([place]); ARF (acute renal failure) ([place]); Abnormal finding on imaging; Oropharyngeal dysphagia; Nausea and vomiting; Hypercalcemia; Anemia in neoplastic disease; Dysphagia; Encounter for screening colonoscopy; Bone metastases ([place]); Portacath in place; and Tobacco use on her problem list.      has No Known Allergies.  Melissa Sullivan does not currently have medications on file.  SURGICAL HISTORY: Past Surgical History:  Procedure Laterality Date  . APPENDECTOMY    . ESOPHAGOGASTRODUODENOSCOPY N/A 12/09/2014   VZC:HYIFOY esophageal stricture/mild-to-noderate erosive gastritis. negative H.pylori  . FLEXIBLE SIGMOIDOSCOPY  2011   Dr. [name]: hyperplastic polyp  . LUNG BIOPSY Right 07/2014   CT guided  . MALONEY DILATION N/A 12/09/2014   Procedure: [name] DILATION;  Surgeon: [name], MD;  Location: [facility];  Service: Endoscopy;  Laterality: N/A;  . PORTACATH PLACEMENT Left 09/01/2014  . SAVORY DILATION N/A 12/09/2014   Procedure: SAVORY DILATION;  Surgeon: [name], MD;  Location: [facility] SUITE;  Service: Endoscopy;  Laterality: N/A;    SOCIAL HISTORY: Social History   Social History  . Marital status: Married    Spouse name: N/A  . Number of children: N/A  . Years of education:  N/A   Occupational History  . Not on file.   Social History Main Topics  . Smoking status: Light Tobacco Smoker    Packs/day: 0.30    Types: Cigarettes  . Smokeless tobacco: Never Used  . Alcohol use No  . Drug use: No  . Sexual activity: Not on file   Other Topics Concern  . Not on file   Social History Narrative  . No narrative on file    FAMILY HISTORY: Family History  Problem Relation Age of Onset  . Cancer Sister     Review of Systems  Constitutional: Negative.   HENT: Negative.   Eyes: Negative.  Negative for blurred vision, double vision and photophobia.  Respiratory: Negative.   Cardiovascular: Negative.   Gastrointestinal: Negative.  Negative for diarrhea and vomiting.  Genitourinary: Negative.   Musculoskeletal: Negative.   Skin: Negative.   Neurological: Negative.  Negative for headaches.  Endo/Heme/Allergies: Negative.   Psychiatric/Behavioral:  The patient has insomnia (chronic).   All other systems reviewed and are negative. 14 point review of systems was performed and is negative except as detailed under history of present illness and above   PHYSICAL EXAMINATION  ECOG PERFORMANCE STATUS: 1 - Symptomatic but completely ambulatory  Vitals:   11/16/16 0855  BP: 113/67  Pulse: 86  Resp: 16  Temp: 98.5 F (36.9 C)   Physical Exam  Constitutional: Melissa Sullivan is oriented to person, place, and time and well-developed, well-nourished, and in no distress.  HENT:  Head: Normocephalic and atraumatic.  Nose: Nose normal.  Mouth/Throat: Oropharynx is clear and moist. No oropharyngeal exudate.  Eyes: Conjunctivae and EOM are normal. Pupils are equal, round, and reactive to light. Right eye exhibits no discharge. Left eye exhibits no discharge. No scleral icterus.  Neck: Normal range of motion. Neck supple. No tracheal deviation present. No thyromegaly present.  Cardiovascular: Normal rate, regular rhythm and normal heart sounds.  Exam reveals no gallop and no  friction rub.   No murmur heard. Pulmonary/Chest: Effort normal and breath sounds normal. Melissa Sullivan has no wheezes. Melissa Sullivan has no rales.  Abdominal: Soft. Bowel sounds are normal. Melissa Sullivan exhibits no distension and no mass. There is no tenderness. There is no rebound and no guarding.  Musculoskeletal: Normal range of motion. Melissa Sullivan exhibits no edema.  Lymphadenopathy:    Melissa Sullivan has no cervical adenopathy.  Neurological: Melissa Sullivan is alert and oriented to person, place, and time. Melissa Sullivan has normal reflexes. No cranial nerve deficit. Gait normal. Coordination normal.  Skin: Skin is warm and dry. No rash noted.  Psychiatric: Mood, memory, affect and judgment normal.  Nursing note and vitals reviewed.    LABORATORY DATA: I have reviewed the data below as listed. CBC    Component Value Date/Time   WBC 7.9 11/16/2016 0904   RBC 3.43 (L) 11/16/2016 0904   HGB 11.0 (L) 11/16/2016 0904   HCT 32.2 (L) 11/16/2016 0904   PLT 263 11/16/2016 0904   MCV 93.9 11/16/2016 0904   MCH 32.1 11/16/2016 0904   MCHC 34.2 11/16/2016 0904   RDW 15.8 (H) 11/16/2016 0904   LYMPHSABS 2.1 11/16/2016 0904   MONOABS 0.6 11/16/2016 0904   EOSABS 0.2 11/16/2016 0904   BASOSABS 0.0 11/16/2016 0904   Results for SHIKIRA, FOLINO (MRN [id number]) as of 11/16/2016 09:21  Ref. Range 11/16/2016 09:04  WBC Latest Ref Range: 4.0 - 10.5 K/uL 7.9  RBC Latest Ref Range: 3.87 - 5.11 MIL/uL 3.43 (L)  Hemoglobin Latest Ref Range: 12.0 - 15.0 g/dL 11.0 (L)  HCT Latest Ref Range: 36.0 - 46.0 % 32.2 (L)  MCV Latest Ref Range: 78.0 - 100.0 fL 93.9  MCH Latest Ref Range: 26.0 - 34.0 pg 32.1  MCHC Latest Ref Range: 30.0 - 36.0 g/dL 34.2  RDW Latest Ref Range: 11.5 - 15.5 % 15.8 (H)  Platelets Latest Ref Range: 150 - 400 K/uL 263  Neutrophils Latest Units: % 64  Lymphocytes Latest Units: % 26  Monocytes Relative Latest Units: % 8  Eosinophil Latest Units: % 2  Basophil Latest Units: % 0  NEUT# Latest Ref Range: 1.7 - 7.7 K/uL 5.1  Lymphocyte #  Latest Ref Range: 0.7 - 4.0 K/uL 2.1  Monocyte # Latest Ref Range: 0.1 - 1.0 K/uL 0.6  Eosinophils Absolute Latest Ref Range: 0.0 - 0.7 K/uL 0.2  Basophils Absolute Latest Ref Range: 0.0 - 0.1 K/uL 0.0   Results for ABBYGAYLE, HELFAND (MRN [id number]) as of 11/16/2016 09:54  Ref. Range 11/16/2016 09:04  Sodium Latest Ref Range: 135 - 145 mmol/L 134 (L)  Potassium Latest Ref Range: 3.5 - 5.1 mmol/L 3.9  Chloride Latest Ref Range: 101 - 111 mmol/L 107  CO2 Latest Ref Range: 22 - 32 mmol/L 21 (L)  BUN Latest Ref Range: 6 - 20 mg/dL 13  Creatinine Latest Ref Range: 0.44 - 1.00 mg/dL 1.35 (H)  Calcium Latest Ref Range: 8.9 - 10.3 mg/dL 9.3  EGFR (Non-African Amer.) Latest Ref Range: >60 mL/min 42 (L)  EGFR (African American) Latest Ref Range: >60 mL/min 48 (L)  Glucose Latest Ref Range: 65 - 99 mg/dL 72  Anion gap Latest Ref Range: 5 - 15  6  Alkaline Phosphatase Latest Ref Range: 38 - 126 U/L 56  Albumin Latest Ref Range: 3.5 - 5.0 g/dL 3.8  AST Latest Ref Range: 15 - 41 U/L 15  ALT Latest Ref Range: 14 - 54 U/L 11 (L)  Total Protein Latest Ref Range: 6.5 - 8.1 g/dL 7.6  Total Bilirubin Latest Ref Range: 0.3 - 1.2 mg/dL 0.5   RADIOLOGY: I have personally reviewed the radiological images as listed and agreed with the findings in the report. Study Result   CLINICAL DATA:  Right lung cancer post radiation therapy. Chemotherapy ongoing.  EXAM: CT CHEST AND ABDOMEN WITH CONTRAST  TECHNIQUE: Multidetector CT imaging of the chest and abdomen was performed following the standard protocol during bolus administration of intravenous contrast.  CONTRAST:  33m ISOVUE-300 IOPAMIDOL (ISOVUE-300) INJECTION 61%  COMPARISON:  CT 05/12/2016 and 02/03/2016.  FINDINGS: CT CHEST FINDINGS  Cardiovascular: Stable atherosclerosis of the aorta, great vessels and coronary arteries. There is a left IJ Port-A-Cath which terminates in the upper SVC. The heart size is normal. There is  no pericardial effusion.  Mediastinum/Nodes: There are no enlarged mediastinal, hilar or axillary lymph nodes. The thyroid gland, trachea and esophagus demonstrate no significant findings.  Lungs/Pleura: There is no pleural effusion. The partially calcified, mass-like density at the right apex appears unchanged. This remains difficult to measure given its irregular shape, although measures approximately 3.6 x 2.8 cm on image 21. No new or enlarging pulmonary nodules are present. There is emphysema with scattered subpleural reticulation and mild bronchiectasis. Small areas of mucous plugging are present in both upper lobes. Fluctuating nodularity along the walls of the trachea and proximal bronchi are again noted, most consistent with adherent secretions. No fixed nodules are seen to suggest neoplasm.  Musculoskeletal/Chest wall: Stable sclerosis of the T2 vertebral body, probably a treated metastasis. Stable sclerosis of the upper right-sided ribs. No new lytic or blastic lesions are identified. No evidence of chest wall lesion.  CT ABDOMEN FINDINGS  Hepatobiliary: The liver is normal in density without focal abnormality. No evidence of gallstones, gallbladder wall thickening or biliary dilatation.  Pancreas: Unremarkable. No pancreatic ductal dilatation or surrounding inflammatory changes.  Spleen: Normal in size without focal abnormality.  Adrenals/Urinary Tract: Complex, partially calcified mass involving the right adrenal gland is again noted. This has a thin neck extending posterior to the IVC. As measured in a similar fashion to the prior examination, this appears unchanged, measuring approximately 3.7 x 2.4 cm on image 54. The left adrenal gland appears normal. Both kidneys appear normal. No evidence of renal mass, hydronephrosis or ureteral calculus. Bladder not imaged.  Stomach/Bowel: Suggested wall thickening of the distal stomach, likely due to  peristalsis. No surrounding abnormality. No evidence of bowel wall thickening, distention or surrounding inflammatory change.  Vascular/Lymphatic: Stable aortic  and branch vessel atherosclerosis. No enlarged abdominal pelvic lymph nodes.  Other: No ascites or peritoneal nodularity. Stable small umbilical hernia containing only fat.  Musculoskeletal: No acute or significant osseous findings.  IMPRESSION: 1. Stable CTs of the chest and abdomen. No evidence of progressive metastatic disease. 2. Probable treated tumor at the right apex, right adrenal gland and T2 vertebral body, stable. 3. Fluctuating nodularity along the walls of the trachea and mainstem bronchi, likely secretions. 4.  Emphysema. (ICD10-J43.9) 5.  Aortic Atherosclerosis (ICD10-170.0)   Electronically Signed   By: Richardean Sale M.D.   On: 08/17/2016 15:37     ASSESSMENT and THERAPY PLAN:   Stage IV adenocarcinoma of the lung, EGFR and ALK negative  Melissa Sullivan is certainly doing well clinically. CT scans have shown stable disease   We will continue with current therapy. Melissa Sullivan has absolutely no major complaints today. Again reviewed the potential side effects of her current treatment. Melissa Sullivan will proceed with Nivolumab today.  We will arrange for repeat imaging after the Holiday, I have ordered these for mid January.    Hypercalcemia/bony metastatic disease  Zometa was last given in August secondary to dental issues. Will re-institute therapy given bone metastases; however have opted for XGEVA given her renal function.   Tobacco ABUSE  I re-emphasized the benefits of smoking cessation. I have offered her methods to quit in the past. Melissa Sullivan has cut back significantly.  Patient is here for cycle 31 of lung nivolumab. Labs reviewed. Results noted above.   Melissa Sullivan has gained 2 lbs since 11/02/16.  Melissa Sullivan has not had a brain scan in quite some time. Melissa Sullivan denies headaches or blurry vision. Melissa Sullivan understands to contact our office  if Melissa Sullivan develops any of these symptoms. May consider brain reimaging in near future.  I have refilled simvastatin and advair today.  I have ordered restaging / repeat scans for the end of January. Melissa Sullivan will return for follow up post-scans to discuss these results.  Orders Placed This Encounter  Procedures  . CT Chest W Contrast    Standing Status:   Future    Standing Expiration Date:   11/16/2017    Order Specific Question:   If indicated for the ordered procedure, I authorize the administration of contrast media per Radiology protocol    Answer:   Yes    Order Specific Question:   Reason for Exam (SYMPTOM  OR DIAGNOSIS REQUIRED)    Answer:   restaging stage IV NSCLC    Order Specific Question:   Is patient pregnant?    Answer:   No    Order Specific Question:   Preferred imaging location?    Answer:   [place]  . CT Abdomen Pelvis W Contrast    Standing Status:   Future    Standing Expiration Date:   11/16/2017    Order Specific Question:   If indicated for the ordered procedure, I authorize the administration of contrast media per Radiology protocol    Answer:   Yes    Order Specific Question:   Reason for Exam (SYMPTOM  OR DIAGNOSIS REQUIRED)    Answer:   restaging stage IV NSCLC    Order Specific Question:   Is patient pregnant?    Answer:   No    Order Specific Question:   Preferred imaging location?    Answer:   [facility]   Meds ordered this encounter  Medications  . simvastatin (ZOCOR) 40 MG tablet  Sig: Take 1 tablet (40 mg total) by mouth every morning.    Dispense:  30 tablet    Refill:  6  . Fluticasone-Salmeterol (ADVAIR DISKUS) 500-50 MCG/DOSE AEPB    Sig: Inhale 1 puff into the lungs 2 (two) times daily.    Dispense:  60 each    Refill:  6   All questions were answered. The patient knows to call the clinic with any problems, questions or concerns. We can certainly see the patient much sooner if necessary.   This document serves as a  record of services personally performed by Ancil Linsey, MD. It was created on her behalf by Arlyce Harman, a trained medical scribe. The creation of this record is based on the scribe's personal observations and the provider's statements to them. This document has been checked and approved by the attending provider.  I have reviewed the above documentation for accuracy and completeness, and I agree with the above.  This note was signed electronically  Larene Beach K. Whitney Muse, MD

## 2016-11-30 ENCOUNTER — Encounter (HOSPITAL_COMMUNITY): Payer: 59 | Attending: Hematology & Oncology

## 2016-11-30 ENCOUNTER — Encounter (HOSPITAL_COMMUNITY): Payer: Self-pay

## 2016-11-30 VITALS — BP 104/53 | HR 85 | Temp 98.9°F | Resp 18 | Wt 94.4 lb

## 2016-11-30 DIAGNOSIS — C3411 Malignant neoplasm of upper lobe, right bronchus or lung: Secondary | ICD-10-CM | POA: Diagnosis not present

## 2016-11-30 DIAGNOSIS — Z5112 Encounter for antineoplastic immunotherapy: Secondary | ICD-10-CM

## 2016-11-30 DIAGNOSIS — E278 Other specified disorders of adrenal gland: Secondary | ICD-10-CM

## 2016-11-30 DIAGNOSIS — E279 Disorder of adrenal gland, unspecified: Secondary | ICD-10-CM | POA: Insufficient documentation

## 2016-11-30 DIAGNOSIS — C7951 Secondary malignant neoplasm of bone: Secondary | ICD-10-CM | POA: Diagnosis not present

## 2016-11-30 LAB — COMPREHENSIVE METABOLIC PANEL
ALT: 12 U/L — ABNORMAL LOW (ref 14–54)
ANION GAP: 8 (ref 5–15)
AST: 17 U/L (ref 15–41)
Albumin: 4.1 g/dL (ref 3.5–5.0)
Alkaline Phosphatase: 65 U/L (ref 38–126)
BUN: 15 mg/dL (ref 6–20)
CHLORIDE: 103 mmol/L (ref 101–111)
CO2: 24 mmol/L (ref 22–32)
Calcium: 10.1 mg/dL (ref 8.9–10.3)
Creatinine, Ser: 1.46 mg/dL — ABNORMAL HIGH (ref 0.44–1.00)
GFR calc Af Amer: 44 mL/min — ABNORMAL LOW (ref >60)
GFR, EST NON AFRICAN AMERICAN: 38 mL/min — AB (ref >60)
Glucose, Bld: 82 mg/dL (ref 65–99)
POTASSIUM: 3.7 mmol/L (ref 3.5–5.1)
Sodium: 135 mmol/L (ref 135–145)
Total Bilirubin: 0.3 mg/dL (ref 0.3–1.2)
Total Protein: 8 g/dL (ref 6.5–8.1)

## 2016-11-30 LAB — CBC WITH DIFFERENTIAL/PLATELET
BASOS PCT: 0 %
Basophils Absolute: 0 10*3/uL (ref 0.0–0.1)
Eosinophils Absolute: 0.1 10*3/uL (ref 0.0–0.7)
Eosinophils Relative: 2 %
HEMATOCRIT: 34.6 % — AB (ref 36.0–46.0)
HEMOGLOBIN: 11.5 g/dL — AB (ref 12.0–15.0)
LYMPHS ABS: 2.2 10*3/uL (ref 0.7–4.0)
LYMPHS PCT: 25 %
MCH: 31.3 pg (ref 26.0–34.0)
MCHC: 33.2 g/dL (ref 30.0–36.0)
MCV: 94 fL (ref 78.0–100.0)
MONOS PCT: 7 %
Monocytes Absolute: 0.7 10*3/uL (ref 0.1–1.0)
NEUTROS ABS: 6 10*3/uL (ref 1.7–7.7)
NEUTROS PCT: 66 %
Platelets: 355 10*3/uL (ref 150–400)
RBC: 3.68 MIL/uL — ABNORMAL LOW (ref 3.87–5.11)
RDW: 15.3 % (ref 11.5–15.5)
WBC: 9 10*3/uL (ref 4.0–10.5)

## 2016-11-30 MED ORDER — SODIUM CHLORIDE 0.9 % IJ SOLN: 10.0000 mL | INTRAMUSCULAR | Status: DC | PRN

## 2016-11-30 MED ORDER — NIVOLUMAB CHEMO INJECTION 100 MG/10ML
240.0000 mg | Freq: Once | INTRAVENOUS | Status: AC
  Administered 2016-11-30: 240 mg via INTRAVENOUS
  Filled 2016-11-30: qty 10

## 2016-11-30 MED ORDER — HEPARIN SOD (PORK) LOCK FLUSH 100 UNIT/ML IV SOLN
INTRAVENOUS | Status: AC
  Filled 2016-11-30: qty 5

## 2016-11-30 MED ORDER — MAGIC MOUTHWASH: 5.0000 mL | Freq: Four times a day (QID) | ORAL | 2 refills | Status: DC | PRN

## 2016-11-30 MED ORDER — SODIUM CHLORIDE 0.9 % IV SOLN
Freq: Once | INTRAVENOUS | Status: AC
  Administered 2016-11-30: 12:00:00 via INTRAVENOUS

## 2016-11-30 MED ORDER — HEPARIN SOD (PORK) LOCK FLUSH 100 UNIT/ML IV SOLN
500.0000 [IU] | Freq: Once | INTRAVENOUS | Status: AC | PRN
  Administered 2016-11-30: 500 [IU]

## 2016-11-30 NOTE — Patient Instructions (Signed)
[facility] Discharge Instructions for Patients Receiving Chemotherapy   Beginning January 23rd 2017 lab work for the [facility] will be done in the  Main lab at [facility] on 1st floor. If you have a lab appointment with the [facility] please come in thru the  Main Entrance and check in at the main information desk   Today you received the following chemotherapy agent: Opdivo.     If you develop nausea and vomiting, or diarrhea that is not controlled by your medication, call the clinic.  The clinic phone number is [phone]. Office hours are Monday-Friday 8:30am-5:00pm.  BELOW ARE SYMPTOMS THAT SHOULD BE REPORTED IMMEDIATELY:  *FEVER GREATER THAN 101.0 F  *CHILLS WITH OR WITHOUT FEVER  NAUSEA AND VOMITING THAT IS NOT CONTROLLED WITH YOUR NAUSEA MEDICATION  *UNUSUAL SHORTNESS OF BREATH  *UNUSUAL BRUISING OR BLEEDING  TENDERNESS IN MOUTH AND THROAT WITH OR WITHOUT PRESENCE OF ULCERS  *URINARY PROBLEMS  *BOWEL PROBLEMS  UNUSUAL RASH Items with * indicate a potential emergency and should be followed up as soon as possible. If you have an emergency after office hours please contact your primary care physician or go to the nearest emergency department.  Please call the clinic during office hours if you have any questions or concerns.   You may also contact the Patient Navigator at [phone] should you have any questions or need assistance in obtaining follow up care.      Resources For Cancer Patients and their Caregivers ? American Cancer Society: Can assist with transportation, wigs, general needs, runs Look Good Feel Better.        [phone] ? Cancer Care: Provides financial assistance, online support groups, medication/co-pay assistance.  1-800-813-HOPE 925-658-6303) ? [facility] Assists [place] cancer patients and their families through emotional , educational and financial support.   [phone] ? [place] DSS Where to apply for food stamps, Medicaid and utility assistance. [phone] ? [employer]: Transportation to medical appointments. [phone] ? Social Security Administration: May apply for disability if have a Stage IV cancer. [phone] ? [employer], Disability and Transit Services: Assists with nutrition, care and transit needs. [phone]

## 2016-11-30 NOTE — Progress Notes (Signed)
Patient tolerated infusion well.  VSS.  Patient ambulatory and stable upon discharge from clinic.   

## 2016-12-12 ENCOUNTER — Ambulatory Visit (HOSPITAL_COMMUNITY)
Admission: RE | Admit: 2016-12-12 | Discharge: 2016-12-12 | Disposition: A | Discharge status: Home / Self Care | Payer: 59 | Source: Ambulatory Visit | Attending: Hematology & Oncology | Admitting: Hematology & Oncology

## 2016-12-12 DIAGNOSIS — C7951 Secondary malignant neoplasm of bone: Secondary | ICD-10-CM | POA: Diagnosis present

## 2016-12-12 DIAGNOSIS — J439 Emphysema, unspecified: Secondary | ICD-10-CM | POA: Diagnosis not present

## 2016-12-12 DIAGNOSIS — I700 Atherosclerosis of aorta: Secondary | ICD-10-CM | POA: Insufficient documentation

## 2016-12-12 DIAGNOSIS — C3411 Malignant neoplasm of upper lobe, right bronchus or lung: Secondary | ICD-10-CM | POA: Diagnosis present

## 2016-12-12 MED ORDER — IOPAMIDOL (ISOVUE-300) INJECTION 61%
80.0000 mL | Freq: Once | INTRAVENOUS | Status: AC | PRN
  Administered 2016-12-12: 80 mL via INTRAVENOUS

## 2016-12-14 ENCOUNTER — Encounter (HOSPITAL_COMMUNITY): Payer: 59

## 2016-12-14 ENCOUNTER — Encounter (HOSPITAL_BASED_OUTPATIENT_CLINIC_OR_DEPARTMENT_OTHER): Payer: 59

## 2016-12-14 ENCOUNTER — Encounter (HOSPITAL_BASED_OUTPATIENT_CLINIC_OR_DEPARTMENT_OTHER): Payer: 59 | Admitting: Hematology & Oncology

## 2016-12-14 ENCOUNTER — Encounter (HOSPITAL_COMMUNITY): Payer: Self-pay | Admitting: Hematology & Oncology

## 2016-12-14 VITALS — BP 101/62 | HR 80 | Temp 98.4°F | Resp 16

## 2016-12-14 VITALS — BP 132/71 | HR 91 | Temp 99.3°F | Resp 16 | Wt 92.9 lb

## 2016-12-14 DIAGNOSIS — E278 Other specified disorders of adrenal gland: Secondary | ICD-10-CM

## 2016-12-14 DIAGNOSIS — C3411 Malignant neoplasm of upper lobe, right bronchus or lung: Secondary | ICD-10-CM

## 2016-12-14 DIAGNOSIS — C7951 Secondary malignant neoplasm of bone: Secondary | ICD-10-CM

## 2016-12-14 DIAGNOSIS — Z5112 Encounter for antineoplastic immunotherapy: Secondary | ICD-10-CM

## 2016-12-14 DIAGNOSIS — R29898 Other symptoms and signs involving the musculoskeletal system: Secondary | ICD-10-CM

## 2016-12-14 DIAGNOSIS — F17200 Nicotine dependence, unspecified, uncomplicated: Secondary | ICD-10-CM

## 2016-12-14 LAB — COMPREHENSIVE METABOLIC PANEL
ALT: 11 U/L — ABNORMAL LOW (ref 14–54)
ANION GAP: 6 (ref 5–15)
AST: 16 U/L (ref 15–41)
Albumin: 3.8 g/dL (ref 3.5–5.0)
Alkaline Phosphatase: 63 U/L (ref 38–126)
BILIRUBIN TOTAL: 0.2 mg/dL — AB (ref 0.3–1.2)
BUN: 9 mg/dL (ref 6–20)
CALCIUM: 8.9 mg/dL (ref 8.9–10.3)
CO2: 24 mmol/L (ref 22–32)
CREATININE: 1.29 mg/dL — AB (ref 0.44–1.00)
Chloride: 105 mmol/L (ref 101–111)
GFR, EST AFRICAN AMERICAN: 51 mL/min — AB (ref >60)
GFR, EST NON AFRICAN AMERICAN: 44 mL/min — AB (ref >60)
Glucose, Bld: 80 mg/dL (ref 65–99)
Potassium: 4 mmol/L (ref 3.5–5.1)
Sodium: 135 mmol/L (ref 135–145)
TOTAL PROTEIN: 7.1 g/dL (ref 6.5–8.1)

## 2016-12-14 LAB — CBC WITH DIFFERENTIAL/PLATELET
Basophils Absolute: 0 10*3/uL (ref 0.0–0.1)
Basophils Relative: 0 %
EOS ABS: 0.2 10*3/uL (ref 0.0–0.7)
Eosinophils Relative: 3 %
HEMATOCRIT: 33.3 % — AB (ref 36.0–46.0)
HEMOGLOBIN: 11.2 g/dL — AB (ref 12.0–15.0)
Lymphocytes Relative: 24 %
Lymphs Abs: 2.1 10*3/uL (ref 0.7–4.0)
MCH: 31.5 pg (ref 26.0–34.0)
MCHC: 33.6 g/dL (ref 30.0–36.0)
MCV: 93.8 fL (ref 78.0–100.0)
MONOS PCT: 10 %
Monocytes Absolute: 0.8 10*3/uL (ref 0.1–1.0)
Neutro Abs: 5.4 10*3/uL (ref 1.7–7.7)
Neutrophils Relative %: 63 %
Platelets: 284 10*3/uL (ref 150–400)
RBC: 3.55 MIL/uL — ABNORMAL LOW (ref 3.87–5.11)
RDW: 15 % (ref 11.5–15.5)
WBC: 8.5 10*3/uL (ref 4.0–10.5)

## 2016-12-14 MED ORDER — DENOSUMAB 120 MG/1.7ML ~~LOC~~ SOLN
120.0000 mg | Freq: Once | SUBCUTANEOUS | Status: AC
  Administered 2016-12-14: 120 mg via SUBCUTANEOUS
  Filled 2016-12-14: qty 1.7

## 2016-12-14 MED ORDER — HEPARIN SOD (PORK) LOCK FLUSH 100 UNIT/ML IV SOLN
500.0000 [IU] | Freq: Once | INTRAVENOUS | Status: AC | PRN
  Administered 2016-12-14: 500 [IU]

## 2016-12-14 MED ORDER — NIVOLUMAB CHEMO INJECTION 100 MG/10ML
240.0000 mg | Freq: Once | INTRAVENOUS | Status: AC
  Administered 2016-12-14: 240 mg via INTRAVENOUS
  Filled 2016-12-14: qty 4

## 2016-12-14 MED ORDER — SODIUM CHLORIDE 0.9 % IV SOLN
Freq: Once | INTRAVENOUS | Status: AC
  Administered 2016-12-14: 10:00:00 via INTRAVENOUS

## 2016-12-14 NOTE — Progress Notes (Signed)
Leonard Downing presents today for injection per MD orders. Xgeva 120 mg administered SQ in right Abdomen. Administration without incident. Patient tolerated well.  Tolerated chemo well. Stable and ambulatory on discharge home with daughter.

## 2016-12-14 NOTE — Patient Instructions (Signed)
[place] at [facility] Discharge Instructions  RECOMMENDATIONS MADE BY THE CONSULTANT AND ANY TEST RESULTS WILL BE SENT TO YOUR REFERRING PHYSICIAN.  You were seen today by Dr. Youlanda Roys will have your MRI head in 3 weeks Treatment and labs in 2 weeks Follow up for treatment labs and appointment with Gershon Mussel and Elzie Rings in 3 weeks  Thank you for choosing [place] at [facility] to provide your oncology and hematology care.  To afford each patient quality time with our provider, please arrive at least 15 minutes before your scheduled appointment time.    If you have a lab appointment with the [facility] please come in thru the  Main Entrance and check in at the main information desk  You need to re-schedule your appointment should you arrive 10 or more minutes late.  We strive to give you quality time with our providers, and arriving late affects you and other patients whose appointments are after yours.  Also, if you no show three or more times for appointments you may be dismissed from the clinic at the providers discretion.     Again, thank you for choosing [facility].  Our hope is that these requests will decrease the amount of time that you wait before being seen by our physicians.       _____________________________________________________________  Should you have questions after your visit to [facility], please contact our office at [phone] between the hours of 8:30 a.m. and 4:30 p.m.  Voicemails left after 4:30 p.m. will not be returned until the following business day.  For prescription refill requests, have your pharmacy contact our office.       Resources For Cancer Patients and their Caregivers ? American Cancer Society: Can assist with transportation, wigs, general needs, runs Look Good Feel Better.        307-246-8511 ? Cancer Care: Provides financial assistance, online support groups,  medication/co-pay assistance.  1-800-813-HOPE 514-725-3529) ? [place] Assists [place] cancer patients and their families through emotional , educational and financial support.  [phone] ? [place] DSS Where to apply for food stamps, Medicaid and utility assistance. 509-613-1358 ? RCATS: Transportation to medical appointments. [phone] ? Social Security Administration: May apply for disability if have a Stage IV cancer. 956-021-2493 608-545-5916 ? [employer], Disability and Transit Services: Assists with nutrition, care and transit needs. [place] Support Programs: '@10RELATIVEDAYS'$ @ > Cancer Support Group  2nd Tuesday of the month 1pm-2pm, Journey Room  > Creative Journey  3rd Tuesday of the month 1130am-1pm, Journey Room  > Look Good Feel Better  1st Wednesday of the month 10am-12 noon, Journey Room (Call [place] to register [phone])

## 2016-12-14 NOTE — Progress Notes (Signed)
PROGRESS NOTE  [name], stage IV, bilateral adrenal metastases, retroperitoneal lymph nodes  Biopsy on 08/01/2014 c/w poorly differentiated adenocarcinoma  XRT completed on 10/27/2014  Right Brachial plexus neuralgia  EGFR mutation negative ALK mutation negative    Cancer of upper lobe of right lung ([place])   07/28/2014 Imaging    CT chest: Large R apical mass consistent with malignancy. This is destroying the R 2nd rib with extension into adjacent soft tissue. R hilar adenopathy with R 5cm adrenal metastatic lesion.      08/01/2014 Initial Biopsy    Lung, needle/core biopsy(ies), right upper lobe - POORLY DIFFERENTIATED ADENOCARCINOMA, SEE COMMENT.      08/08/2014 PET scan    Large hypermetabolic R apical mass with evidence of direct chest wall and mediastinal invasion, right retrocrural lymphadenopathy, extensive retroperitoneal lymphadenopathy, and metastatic lesions to the adrenal glands       09/02/2014 - 11/04/2014 Chemotherapy    Cisplatin/Pemetrexed/Avastin every 21 days x 4 cycles      10/07/2014 - 10/27/2014 Radiation Therapy    Right lung apex for control of brachioplexopathy.      12/24/2014 - 02/25/2015 Chemotherapy    Alimta/Avastin every 21 days.      02/20/2015 Imaging    Increase in size of right adrenal metastasis and subjacent confluent retrocaval lymphadenopathy      02/25/2015 -  Chemotherapy    Nivolumab, zometa      05/04/2015 Imaging    CT CAP- Stable to slight decrease in the posterior right apical lesion. Stable appearance of posterior right upper rib an upper thoracic bony lesions. Slight improvement in right upper lobe tree-in-bud opacity. No new or progressive findings in...      07/28/2015 Imaging    CT CAP- Reduced size of the right apical pleural parenchymal lesion and reduced size of the right adrenal metastatic lesion. Resolution of prior retrocrural adenopathy.  Right eccentric T1 and T2 sclerosis with sclerosis and tapering of the  right second..      11/17/2015 Imaging    CT CAP- Stable soft tissue thickening in the apex of the right hemi thorax. Stable right adrenal metastasis. Nodularity along the trachea and mainstem bronchi, relatively new from 07/28/2015, favoring adherent debris.      11/18/2015 Treatment Plan Change    Zometa HELD for upcoming tooth extraction      11/24/2015 Treatment Plan Change    Zometa on hold at this time in preparation for tooth extraction in March 2017.  Zometa las given on 11/18/2015.      02/03/2016 Imaging    CT CAP- Heterogeneous right apical masslike consolidation and right adrenal metastasis are unchanged      04/06/2016 Treatment Plan Change    Zometa restarted 6 weeks out from tooth extraction (04/06/2016)      05/12/2016 Imaging    CT CAP- NED in the chest, abdomen or pelvis.  Some areas of nodularity associated with the mainstem bronchi in the left upper lobe bronchus, favored to represent adherent inspissated secretions      08/17/2016 Imaging    CT CAP- 1. Stable CTs of the chest and abdomen. No evidence of progressive metastatic disease. 2. Probable treated tumor at the right apex, right adrenal gland and T2 vertebral body, stable. 3. Fluctuating nodularity along the walls of the trachea and mainstem bronchi, likely secretions.      12/12/2016 Imaging    Further decrease in size of treated tumor within the right apex. 2. Stable treated tumor  involving the right second rib and T2 vertebra. 3. Stable right adrenal gland treated tumor. 4. Emphysema 5. Aortic atherosclerosis       CURRENT THERAPY: Nivolumab/zometa  INTERVAL HISTORY: Melissa Sullivan 61 y.o. female returns for follow-up of stage IV adenocarcinoma of the lung. She is doing well.  She sleeps ok. She denies any pain. Her mood is good. She continues on single agent nivolumab.   Melissa Sullivan is accompanied by her daughter. I have reviewed the scans with the patient.   She is doing well today. She  is very happy with her scan results.  She gets occasional headaches when her sinuses are clogged, but otherwise she is fine. Denies vision changes. Denies bowel problems.   She has occasional cramps in her right hand.  She remains active. No difficulties with her ADl's. No diarrhea. No cough.   MEDICAL HISTORY: Past Medical History:  Diagnosis Date  . Adrenal mass, right ([place]) 07/28/2014  . Bone metastases ([place]) 04/05/2016  . Cancer ([place])    lung  right  . Diabetes mellitus without complication ([place])   . Hyperlipidemia   . Hypertension   . Lung mass 07/28/2014  . Reflux     has Adrenal mass, right ([place]); Cancer of upper lobe of right lung ([place]); ARF (acute renal failure) ([place]); Abnormal finding on imaging; Oropharyngeal dysphagia; Nausea and vomiting; Hypercalcemia; Anemia in neoplastic disease; Dysphagia; Encounter for screening colonoscopy; Bone metastases ([place]); Portacath in place; and Tobacco use on her problem list.      has No Known Allergies.  Melissa Sullivan had no medications administered during this visit.  SURGICAL HISTORY: Past Surgical History:  Procedure Laterality Date  . APPENDECTOMY    . ESOPHAGOGASTRODUODENOSCOPY N/A 12/09/2014   OEU:MPNTIR esophageal stricture/mild-to-noderate erosive gastritis. negative H.pylori  . FLEXIBLE SIGMOIDOSCOPY  2011   Dr. [name]: hyperplastic polyp  . LUNG BIOPSY Right 07/2014   CT guided  . MALONEY DILATION N/A 12/09/2014   Procedure: [name] DILATION;  Surgeon: [name], MD;  [facility];  Service: Endoscopy;  Laterality: N/A;  . PORTACATH PLACEMENT Left 09/01/2014  . SAVORY DILATION N/A 12/09/2014   Procedure: SAVORY DILATION;  Surgeon: [name], MD;  Location: [facility];  Service: Endoscopy;  Laterality: N/A;    SOCIAL HISTORY: Social History   Social History  . Marital status: Married    Spouse name: N/A  . Number of children: N/A  . Years of education: N/A   Occupational History  . Not on file.     Social History Main Topics  . Smoking status: Light Tobacco Smoker    Packs/day: 0.30    Types: Cigarettes  . Smokeless tobacco: Never Used  . Alcohol use No  . Drug use: No  . Sexual activity: Not on file   Other Topics Concern  . Not on file   Social History Narrative  . No narrative on file    FAMILY HISTORY: Family History  Problem Relation Age of Onset  . Cancer Sister     Review of Systems  Constitutional: Negative.   HENT: Negative.   Eyes: Negative.  Negative for blurred vision and double vision.  Respiratory: Negative.   Cardiovascular: Negative.   Gastrointestinal: Negative.  Negative for blood in stool, constipation, diarrhea and melena.  Genitourinary: Negative.   Musculoskeletal:       Cramping in right hand.   Skin: Negative.   Neurological: Positive for headaches.  Endo/Heme/Allergies: Negative.   Psychiatric/Behavioral: The patient  has insomnia (chronic).   All other systems reviewed and are negative. 14 point review of systems was performed and is negative except as detailed under history of present illness and above  PHYSICAL EXAMINATION  ECOG PERFORMANCE STATUS: 1 - Symptomatic but completely ambulatory  Vitals:   12/14/16 0930  BP: 132/71  Pulse: 91  Resp: 16  Temp: 99.3 F (37.4 C)    Physical Exam  Constitutional: She is oriented to person, place, and time and well-developed, well-nourished, and in no distress.  Pt was able to get on exam table without assistance.   HENT:  Head: Normocephalic and atraumatic.  Nose: Nose normal.  Mouth/Throat: Oropharynx is clear and moist. No oropharyngeal exudate.  Eyes: Conjunctivae and EOM are normal. Pupils are equal, round, and reactive to light. Right eye exhibits no discharge. Left eye exhibits no discharge. No scleral icterus.  Neck: Normal range of motion. Neck supple. No tracheal deviation present. No thyromegaly present.  Cardiovascular: Normal rate, regular rhythm and normal heart  sounds.  Exam reveals no gallop and no friction rub.   No murmur heard. Pulmonary/Chest: Effort normal and breath sounds normal. She has no wheezes. She has no rales.  Abdominal: Soft. Bowel sounds are normal. She exhibits no distension and no mass. There is no tenderness. There is no rebound and no guarding.  Musculoskeletal: Normal range of motion. She exhibits no edema.  Lymphadenopathy:    She has no cervical adenopathy.  Neurological: She is alert and oriented to person, place, and time. She has normal reflexes. No cranial nerve deficit. Gait normal. Coordination normal.  Skin: Skin is warm and dry. No rash noted.  Psychiatric: Mood, memory, affect and judgment normal.  Nursing note and vitals reviewed.  LABORATORY DATA: I have reviewed the data below as listed. CBC    Component Value Date/Time   WBC 8.5 12/14/2016 0844   RBC 3.55 (L) 12/14/2016 0844   HGB 11.2 (L) 12/14/2016 0844   HCT 33.3 (L) 12/14/2016 0844   PLT 284 12/14/2016 0844   MCV 93.8 12/14/2016 0844   MCH 31.5 12/14/2016 0844   MCHC 33.6 12/14/2016 0844   RDW 15.0 12/14/2016 0844   LYMPHSABS 2.1 12/14/2016 0844   MONOABS 0.8 12/14/2016 0844   EOSABS 0.2 12/14/2016 0844   BASOSABS 0.0 12/14/2016 0844    RADIOLOGY: I have personally reviewed the radiological images as listed and agreed with the findings in the report. Study Result   CLINICAL DATA:  Followup right lung mass. Status post chemotherapy and radiation.  EXAM: CT CHEST, ABDOMEN, AND PELVIS WITH CONTRAST  TECHNIQUE: Multidetector CT imaging of the chest, abdomen and pelvis was performed following the standard protocol during bolus administration of intravenous contrast.  CONTRAST:  27m ISOVUE-300 IOPAMIDOL (ISOVUE-300) INJECTION 61%  COMPARISON:  08/17/2016  FINDINGS: CT CHEST FINDINGS  Cardiovascular: The heart size appears normal. Aortic atherosclerosis noted.  Mediastinum/Nodes: The trachea appears patent and is midline.  Normal appearance of the esophagus. No mediastinal adenopathy. Right hilar node measures 9 mm, image 22 of series 2. Unchanged from previous exam.  Lungs/Pleura: No pleural fluid. Advanced changes of centrilobular emphysema. Right upper lobe lung mass which appears partially calcified Measures 3.2 x 2.5 by 2.2 cm. On the previous exam this measured 3.6 x 2.8 by 2.9 cm. Impacted distal bronchial within the anteromedial left upper lobe is unchanged, image 65 of series 3. No new or progressive disease identified  Musculoskeletal: Sclerotic changes involving the posterior aspect of the right second rib appears  similar to previous exam. Asymmetric sclerosis involving the right side of the T2 vertebra is unchanged from previous study. No new or progressive disease identified within the visualized osseous structures.  CT ABDOMEN PELVIS FINDINGS  Hepatobiliary: No focal liver abnormality is seen. No gallstones, gallbladder wall thickening, or biliary dilatation.  Pancreas: Unremarkable. No pancreatic ductal dilatation or surrounding inflammatory changes.  Spleen: Normal in size without focal abnormality.  Adrenals/Urinary Tract: The left adrenal gland appears normal. Partially calcified lesion involving the right adrenal gland measures 3.3 x 2.7 cm, image 52 of series 2. Previously this was measured at 3.7 x 2.4 cm. Normal appearance of the adrenal glands. Urinary bladder is normal.  Stomach/Bowel: The stomach is normal. The small bowel loops have a normal course and caliber. No pathologic dilatation of the proximal colon. Distal colonic diverticula noted without acute inflammation.  Vascular/Lymphatic: Aortic atherosclerosis. No enlarged retroperitoneal or mesenteric adenopathy. No enlarged pelvic or inguinal lymph nodes.  Reproductive: Uterus and bilateral adnexa are unremarkable.  Other: No abdominal wall hernia or abnormality. No  abdominopelvic ascites.  Musculoskeletal: No aggressive lytic or sclerotic bone lesions identified.  IMPRESSION: 1. Further decrease in size of treated tumor within the right apex. 2. Stable treated tumor involving the right second rib and T2 vertebra. 3. Stable right adrenal gland treated tumor. 4. Emphysema 5. Aortic atherosclerosis.   Electronically Signed   By: Kerby Moors M.D.   On: 12/12/2016 15:03    ASSESSMENT and THERAPY PLAN:   Stage IV adenocarcinoma of the lung, EGFR and ALK negative  She is certainly doing well clinically. CT scans show improving disease.  We will continue with current therapy. She has absolutely no major complaints today. Again reviewed the potential side effects of her current treatment. She will proceed with Nivolumab today.   I have ordered MRI of the brain. She has not had brian imaging in a long time, has intermittent headaches/ R hand cramping, weakness.   Hypercalcemia/bony metastatic disease  Dental issues are resolved. She is on XGEVA.   Tobacco ABUSE  I re-emphasized the benefits of smoking cessation. I have offered her methods to quit in the past. She has cut back significantly.  She will return for treatment in 2 weeks. Follow-up appointment and therapy in 4 week. Labs at each visit.    Orders Placed This Encounter  Procedures  . MR BRAIN W WO CONTRAST    Standing Status:   Future    Standing Expiration Date:   02/11/2018    Order Specific Question:   If indicated for the ordered procedure, I authorize the administration of contrast media per Radiology protocol    Answer:   Yes    Order Specific Question:   Reason for Exam (SYMPTOM  OR DIAGNOSIS REQUIRED)    Answer:   Stage IV NSCLC, R hand weakness    Order Specific Question:   Preferred imaging location?    Answer:   [facility] (table limit-350lbs)    Order Specific Question:   What is the patient's sedation requirement?    Answer:   Oral Sedation    Order  Specific Question:   Does the patient have a pacemaker or implanted devices?    Answer:   No  . CBC with Differential    Standing Status:   Standing    Number of Occurrences:   6    Standing Expiration Date:   04/13/2017  . Comprehensive metabolic panel    Standing Status:   Standing  Number of Occurrences:   6    Standing Expiration Date:   04/13/2017   No orders of the defined types were placed in this encounter.  All questions were answered. The patient knows to call the clinic with any problems, questions or concerns. We can certainly see the patient much sooner if necessary.   This document serves as a record of services personally performed by Ancil Linsey, MD. It was created on her behalf by Martinique Casey, a trained medical scribe. The creation of this record is based on the scribe's personal observations and the provider's statements to them. This document has been checked and approved by the attending provider.  I have reviewed the above documentation for accuracy and completeness, and I agree with the above.  This note was signed electronically  Larene Beach K. Whitney Muse, MD

## 2016-12-23 ENCOUNTER — Encounter (HOSPITAL_COMMUNITY): Payer: Self-pay | Admitting: Hematology & Oncology

## 2016-12-27 ENCOUNTER — Encounter (HOSPITAL_COMMUNITY): Payer: Self-pay | Admitting: Hematology & Oncology

## 2016-12-28 ENCOUNTER — Encounter (HOSPITAL_COMMUNITY): Payer: Self-pay

## 2016-12-28 ENCOUNTER — Other Ambulatory Visit: Payer: Self-pay | Admitting: Hematology and Oncology

## 2016-12-28 ENCOUNTER — Encounter (HOSPITAL_COMMUNITY): Payer: 59 | Attending: Adult Health

## 2016-12-28 ENCOUNTER — Encounter (HOSPITAL_COMMUNITY): Payer: 59

## 2016-12-28 VITALS — BP 101/60 | HR 78 | Temp 98.9°F | Resp 18 | Wt 94.0 lb

## 2016-12-28 DIAGNOSIS — Z9889 Other specified postprocedural states: Secondary | ICD-10-CM | POA: Insufficient documentation

## 2016-12-28 DIAGNOSIS — C3411 Malignant neoplasm of upper lobe, right bronchus or lung: Secondary | ICD-10-CM | POA: Insufficient documentation

## 2016-12-28 DIAGNOSIS — Z923 Personal history of irradiation: Secondary | ICD-10-CM | POA: Diagnosis not present

## 2016-12-28 DIAGNOSIS — E278 Other specified disorders of adrenal gland: Secondary | ICD-10-CM

## 2016-12-28 DIAGNOSIS — I10 Essential (primary) hypertension: Secondary | ICD-10-CM | POA: Diagnosis not present

## 2016-12-28 DIAGNOSIS — F17210 Nicotine dependence, cigarettes, uncomplicated: Secondary | ICD-10-CM | POA: Diagnosis not present

## 2016-12-28 DIAGNOSIS — E119 Type 2 diabetes mellitus without complications: Secondary | ICD-10-CM | POA: Insufficient documentation

## 2016-12-28 DIAGNOSIS — C7951 Secondary malignant neoplasm of bone: Secondary | ICD-10-CM | POA: Insufficient documentation

## 2016-12-28 DIAGNOSIS — Z9221 Personal history of antineoplastic chemotherapy: Secondary | ICD-10-CM | POA: Diagnosis not present

## 2016-12-28 DIAGNOSIS — Z5112 Encounter for antineoplastic immunotherapy: Secondary | ICD-10-CM

## 2016-12-28 DIAGNOSIS — E785 Hyperlipidemia, unspecified: Secondary | ICD-10-CM | POA: Diagnosis not present

## 2016-12-28 DIAGNOSIS — G5603 Carpal tunnel syndrome, bilateral upper limbs: Secondary | ICD-10-CM | POA: Diagnosis not present

## 2016-12-28 DIAGNOSIS — K219 Gastro-esophageal reflux disease without esophagitis: Secondary | ICD-10-CM | POA: Diagnosis not present

## 2016-12-28 LAB — CBC WITH DIFFERENTIAL/PLATELET
BASOS ABS: 0 10*3/uL (ref 0.0–0.1)
Basophils Relative: 0 %
EOS ABS: 0.2 10*3/uL (ref 0.0–0.7)
Eosinophils Relative: 2 %
HCT: 37 % (ref 36.0–46.0)
HEMOGLOBIN: 12.1 g/dL (ref 12.0–15.0)
LYMPHS ABS: 1.7 10*3/uL (ref 0.7–4.0)
Lymphocytes Relative: 18 %
MCH: 30.9 pg (ref 26.0–34.0)
MCHC: 32.7 g/dL (ref 30.0–36.0)
MCV: 94.6 fL (ref 78.0–100.0)
Monocytes Absolute: 0.6 10*3/uL (ref 0.1–1.0)
Monocytes Relative: 7 %
NEUTROS PCT: 73 %
Neutro Abs: 6.8 10*3/uL (ref 1.7–7.7)
Platelets: 313 10*3/uL (ref 150–400)
RBC: 3.91 MIL/uL (ref 3.87–5.11)
RDW: 15.1 % (ref 11.5–15.5)
WBC: 9.4 10*3/uL (ref 4.0–10.5)

## 2016-12-28 LAB — COMPREHENSIVE METABOLIC PANEL
ALK PHOS: 63 U/L (ref 38–126)
ALT: 12 U/L — AB (ref 14–54)
AST: 17 U/L (ref 15–41)
Albumin: 4 g/dL (ref 3.5–5.0)
Anion gap: 8 (ref 5–15)
BUN: 11 mg/dL (ref 6–20)
CALCIUM: 9.8 mg/dL (ref 8.9–10.3)
CO2: 25 mmol/L (ref 22–32)
CREATININE: 1.3 mg/dL — AB (ref 0.44–1.00)
Chloride: 104 mmol/L (ref 101–111)
GFR calc non Af Amer: 44 mL/min — ABNORMAL LOW (ref >60)
GFR, EST AFRICAN AMERICAN: 51 mL/min — AB (ref >60)
GLUCOSE: 85 mg/dL (ref 65–99)
Potassium: 4.2 mmol/L (ref 3.5–5.1)
SODIUM: 137 mmol/L (ref 135–145)
Total Bilirubin: 0.3 mg/dL (ref 0.3–1.2)
Total Protein: 7.6 g/dL (ref 6.5–8.1)

## 2016-12-28 MED ORDER — SODIUM CHLORIDE 0.9 % IJ SOLN: 10.0000 mL | INTRAMUSCULAR | Status: DC | PRN

## 2016-12-28 MED ORDER — HEPARIN SOD (PORK) LOCK FLUSH 100 UNIT/ML IV SOLN
500.0000 [IU] | Freq: Once | INTRAVENOUS | Status: AC | PRN
  Administered 2016-12-28: 500 [IU]

## 2016-12-28 MED ORDER — SODIUM CHLORIDE 0.9 % IV SOLN
240.0000 mg | Freq: Once | INTRAVENOUS | Status: AC
  Administered 2016-12-28: 240 mg via INTRAVENOUS
  Filled 2016-12-28: qty 24

## 2016-12-28 MED ORDER — SODIUM CHLORIDE 0.9 % IV SOLN
Freq: Once | INTRAVENOUS | Status: AC
  Administered 2016-12-28: 10:00:00 via INTRAVENOUS

## 2016-12-28 MED ORDER — HEPARIN SOD (PORK) LOCK FLUSH 100 UNIT/ML IV SOLN
INTRAVENOUS | Status: AC
  Filled 2016-12-28: qty 5

## 2016-12-28 NOTE — Patient Instructions (Signed)
[facility] Discharge Instructions for Patients Receiving Chemotherapy   Beginning January 23rd 2017 lab work for the [facility] will be done in the  Main lab at [facility] on 1st floor. If you have a lab appointment with the [facility] please come in thru the  Main Entrance and check in at the main information desk   Today you received the following chemotherapy agent: Opdivo.     If you develop nausea and vomiting, or diarrhea that is not controlled by your medication, call the clinic.  The clinic phone number is [phone]. Office hours are Monday-Friday 8:30am-5:00pm.  BELOW ARE SYMPTOMS THAT SHOULD BE REPORTED IMMEDIATELY:  *FEVER GREATER THAN 101.0 F  *CHILLS WITH OR WITHOUT FEVER  NAUSEA AND VOMITING THAT IS NOT CONTROLLED WITH YOUR NAUSEA MEDICATION  *UNUSUAL SHORTNESS OF BREATH  *UNUSUAL BRUISING OR BLEEDING  TENDERNESS IN MOUTH AND THROAT WITH OR WITHOUT PRESENCE OF ULCERS  *URINARY PROBLEMS  *BOWEL PROBLEMS  UNUSUAL RASH Items with * indicate a potential emergency and should be followed up as soon as possible. If you have an emergency after office hours please contact your primary care physician or go to the nearest emergency department.  Please call the clinic during office hours if you have any questions or concerns.   You may also contact the Patient Navigator at [phone] should you have any questions or need assistance in obtaining follow up care.      Resources For Cancer Patients and their Caregivers ? American Cancer Society: Can assist with transportation, wigs, general needs, runs Look Good Feel Better.        587-108-3921 ? Cancer Care: Provides financial assistance, online support groups, medication/co-pay assistance.  1-800-813-HOPE 937-006-6412) ? [place] Assists [place] cancer patients and their families through emotional , educational and financial support.   [phone] ? [place] DSS Where to apply for food stamps, Medicaid and utility assistance. 570-833-0583 ? RCATS: Transportation to medical appointments. 725-272-9812 ? Social Security Administration: May apply for disability if have a Stage IV cancer. [phone] ? [employer], Disability and Transit Services: Assists with nutrition, care and transit needs. [phone]

## 2016-12-28 NOTE — Progress Notes (Signed)
Patient tolerated infusion well.  VSS.  Patient ambulatory and stable upon discharge from clinic.   

## 2017-01-04 ENCOUNTER — Ambulatory Visit (HOSPITAL_COMMUNITY)
Admission: RE | Admit: 2017-01-04 | Discharge: 2017-01-04 | Disposition: A | Discharge status: Home / Self Care | Payer: 59 | Source: Ambulatory Visit | Attending: Hematology & Oncology | Admitting: Hematology & Oncology

## 2017-01-04 DIAGNOSIS — R29898 Other symptoms and signs involving the musculoskeletal system: Secondary | ICD-10-CM | POA: Diagnosis not present

## 2017-01-04 DIAGNOSIS — C7951 Secondary malignant neoplasm of bone: Secondary | ICD-10-CM

## 2017-01-04 DIAGNOSIS — E278 Other specified disorders of adrenal gland: Secondary | ICD-10-CM

## 2017-01-04 DIAGNOSIS — C3411 Malignant neoplasm of upper lobe, right bronchus or lung: Secondary | ICD-10-CM

## 2017-01-04 DIAGNOSIS — E279 Disorder of adrenal gland, unspecified: Secondary | ICD-10-CM | POA: Diagnosis not present

## 2017-01-04 MED ORDER — GADOBENATE DIMEGLUMINE 529 MG/ML IV SOLN
10.0000 mL | Freq: Once | INTRAVENOUS | Status: AC | PRN
  Administered 2017-01-04: 8 mL via INTRAVENOUS

## 2017-01-11 ENCOUNTER — Encounter (HOSPITAL_BASED_OUTPATIENT_CLINIC_OR_DEPARTMENT_OTHER): Payer: 59

## 2017-01-11 ENCOUNTER — Encounter (HOSPITAL_COMMUNITY): Payer: 59 | Attending: Oncology | Admitting: Oncology

## 2017-01-11 ENCOUNTER — Ambulatory Visit (HOSPITAL_COMMUNITY): Payer: 59 | Admitting: Oncology

## 2017-01-11 ENCOUNTER — Encounter (HOSPITAL_COMMUNITY): Payer: Self-pay | Admitting: Oncology

## 2017-01-11 ENCOUNTER — Encounter (HOSPITAL_COMMUNITY): Payer: 59

## 2017-01-11 VITALS — BP 132/96 | HR 90 | Temp 98.8°F | Resp 16 | Ht 59.0 in | Wt 94.6 lb

## 2017-01-11 VITALS — BP 98/56 | HR 77 | Temp 98.1°F | Resp 18

## 2017-01-11 DIAGNOSIS — C7972 Secondary malignant neoplasm of left adrenal gland: Secondary | ICD-10-CM | POA: Diagnosis not present

## 2017-01-11 DIAGNOSIS — E278 Other specified disorders of adrenal gland: Secondary | ICD-10-CM

## 2017-01-11 DIAGNOSIS — C7971 Secondary malignant neoplasm of right adrenal gland: Secondary | ICD-10-CM | POA: Diagnosis not present

## 2017-01-11 DIAGNOSIS — C3411 Malignant neoplasm of upper lobe, right bronchus or lung: Secondary | ICD-10-CM | POA: Diagnosis not present

## 2017-01-11 DIAGNOSIS — C7951 Secondary malignant neoplasm of bone: Secondary | ICD-10-CM

## 2017-01-11 DIAGNOSIS — Z5112 Encounter for antineoplastic immunotherapy: Secondary | ICD-10-CM

## 2017-01-11 DIAGNOSIS — Z720 Tobacco use: Secondary | ICD-10-CM

## 2017-01-11 LAB — CBC WITH DIFFERENTIAL/PLATELET
BASOS ABS: 0 10*3/uL (ref 0.0–0.1)
Basophils Relative: 0 %
EOS PCT: 2 %
Eosinophils Absolute: 0.1 10*3/uL (ref 0.0–0.7)
HEMATOCRIT: 36.9 % (ref 36.0–46.0)
Hemoglobin: 11.8 g/dL — ABNORMAL LOW (ref 12.0–15.0)
LYMPHS PCT: 20 %
Lymphs Abs: 1.4 10*3/uL (ref 0.7–4.0)
MCH: 29.9 pg (ref 26.0–34.0)
MCHC: 32 g/dL (ref 30.0–36.0)
MCV: 93.4 fL (ref 78.0–100.0)
MONOS PCT: 9 %
Monocytes Absolute: 0.6 10*3/uL (ref 0.1–1.0)
NEUTROS ABS: 4.8 10*3/uL (ref 1.7–7.7)
Neutrophils Relative %: 69 %
PLATELETS: 380 10*3/uL (ref 150–400)
RBC: 3.95 MIL/uL (ref 3.87–5.11)
RDW: 14.7 % (ref 11.5–15.5)
WBC: 7 10*3/uL (ref 4.0–10.5)

## 2017-01-11 LAB — COMPREHENSIVE METABOLIC PANEL
ALT: 9 U/L — ABNORMAL LOW (ref 14–54)
ANION GAP: 8 (ref 5–15)
AST: 17 U/L (ref 15–41)
Albumin: 3.9 g/dL (ref 3.5–5.0)
Alkaline Phosphatase: 67 U/L (ref 38–126)
BILIRUBIN TOTAL: 0.4 mg/dL (ref 0.3–1.2)
BUN: 11 mg/dL (ref 6–20)
CHLORIDE: 102 mmol/L (ref 101–111)
CO2: 26 mmol/L (ref 22–32)
Calcium: 10.3 mg/dL (ref 8.9–10.3)
Creatinine, Ser: 1.3 mg/dL — ABNORMAL HIGH (ref 0.44–1.00)
GFR calc non Af Amer: 44 mL/min — ABNORMAL LOW (ref >60)
GFR, EST AFRICAN AMERICAN: 51 mL/min — AB (ref >60)
Glucose, Bld: 111 mg/dL — ABNORMAL HIGH (ref 65–99)
POTASSIUM: 4.2 mmol/L (ref 3.5–5.1)
Sodium: 136 mmol/L (ref 135–145)
TOTAL PROTEIN: 8.1 g/dL (ref 6.5–8.1)

## 2017-01-11 LAB — TSH: TSH: 1.207 u[IU]/mL (ref 0.350–4.500)

## 2017-01-11 MED ORDER — SODIUM CHLORIDE 0.9 % IJ SOLN
10.0000 mL | INTRAMUSCULAR | Status: DC | PRN
  Administered 2017-01-11: 10 mL
  Filled 2017-01-11: qty 10

## 2017-01-11 MED ORDER — SODIUM CHLORIDE 0.9 % IV SOLN
240.0000 mg | Freq: Once | INTRAVENOUS | Status: AC
  Administered 2017-01-11: 240 mg via INTRAVENOUS
  Filled 2017-01-11: qty 24

## 2017-01-11 MED ORDER — HEPARIN SOD (PORK) LOCK FLUSH 100 UNIT/ML IV SOLN
500.0000 [IU] | Freq: Once | INTRAVENOUS | Status: AC | PRN
  Administered 2017-01-11: 500 [IU]
  Filled 2017-01-11: qty 5

## 2017-01-11 MED ORDER — SODIUM CHLORIDE 0.9 % IV SOLN
Freq: Once | INTRAVENOUS | Status: AC
Start: 2017-01-11 — End: 2017-01-11
  Administered 2017-01-11: 11:00:00 via INTRAVENOUS

## 2017-01-11 MED ORDER — DENOSUMAB 120 MG/1.7ML ~~LOC~~ SOLN
120.0000 mg | Freq: Once | SUBCUTANEOUS | Status: AC
  Administered 2017-01-11: 120 mg via SUBCUTANEOUS
  Filled 2017-01-11: qty 1.7

## 2017-01-11 NOTE — Patient Instructions (Signed)
[place] at [facility] Discharge Instructions  RECOMMENDATIONS MADE BY THE CONSULTANT AND ANY TEST RESULTS WILL BE SENT TO YOUR REFERRING PHYSICIAN.  You were seen today by Kirby Crigler PA-C. Treatment today and every 2 weeks. Xgeva injection today and every 4 weeks. Return in 4 weeks for follow up.   Thank you for choosing [place] at [facility] to provide your oncology and hematology care.  To afford each patient quality time with our provider, please arrive at least 15 minutes before your scheduled appointment time.    If you have a lab appointment with the [facility] please come in thru the  Main Entrance and check in at the main information desk  You need to re-schedule your appointment should you arrive 10 or more minutes late.  We strive to give you quality time with our providers, and arriving late affects you and other patients whose appointments are after yours.  Also, if you no show three or more times for appointments you may be dismissed from the clinic at the providers discretion.     Again, thank you for choosing [facility].  Our hope is that these requests will decrease the amount of time that you wait before being seen by our physicians.       _____________________________________________________________  Should you have questions after your visit to [facility], please contact our office at [phone] between the hours of 8:30 a.m. and 4:30 p.m.  Voicemails left after 4:30 p.m. will not be returned until the following business day.  For prescription refill requests, have your pharmacy contact our office.       Resources For Cancer Patients and their Caregivers ? American Cancer Society: Can assist with transportation, wigs, general needs, runs Look Good Feel Better.        8590533563 ? Cancer Care: Provides financial assistance, online support groups, medication/co-pay assistance.   1-800-813-HOPE 725-239-2985) ? [place] Assists [place] cancer patients and their families through emotional , educational and financial support.  979-049-7636 ? [place] DSS Where to apply for food stamps, Medicaid and utility assistance. [phone] ? RCATS: Transportation to medical appointments. 858-376-4991 ? Social Security Administration: May apply for disability if have a Stage IV cancer. 415-808-6483 9030281948 ? [employer], Disability and Transit Services: Assists with nutrition, care and transit needs. [place] Support Programs: '@10RELATIVEDAYS'$ @ > Cancer Support Group  2nd Tuesday of the month 1pm-2pm, Journey Room  > Creative Journey  3rd Tuesday of the month 1130am-1pm, Journey Room  > Look Good Feel Better  1st Wednesday of the month 10am-12 noon, Journey Room (Call [place] to register [phone])

## 2017-01-11 NOTE — Progress Notes (Signed)
  Melissa Sullivan presents today for injection per MD orders. Xgeva '120mg'$  administered SQ in right Abdomen. Administration without incident. Patient tolerated well.    Chemotherapy given today per orders. Patient tolerated it well, no problems. Vitals stable and discharged home from clinic ambulatory.follow up as scheduled.

## 2017-01-11 NOTE — Progress Notes (Signed)
Melissa Neighbors, MD [address] [place] [address]  Cancer of upper lobe of right lung ([place]) - Plan: TSH, TSH  CURRENT THERAPY: Nivolumab every 2 weeks.  Xgeva every 4 weeks.  INTERVAL HISTORY: Melissa Sullivan 61 y.o. 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    Cancer of upper lobe of right lung ([place])   07/28/2014 Imaging    CT chest: Large R apical mass consistent with malignancy. This is destroying the R 2nd rib with extension into adjacent soft tissue. R hilar adenopathy with R 5cm adrenal metastatic lesion.      08/01/2014 Initial Biopsy    Lung, needle/core biopsy(ies), right upper lobe - POORLY DIFFERENTIATED ADENOCARCINOMA, SEE COMMENT.      08/08/2014 PET scan    Large hypermetabolic R apical mass with evidence of direct chest wall and mediastinal invasion, right retrocrural lymphadenopathy, extensive retroperitoneal lymphadenopathy, and metastatic lesions to the adrenal glands       09/02/2014 - 11/04/2014 Chemotherapy    Cisplatin/Pemetrexed/Avastin every 21 days x 4 cycles      10/07/2014 - 10/27/2014 Radiation Therapy    Right lung apex for control of brachioplexopathy.      12/24/2014 - 02/25/2015 Chemotherapy    Alimta/Avastin every 21 days.      02/20/2015 Imaging    Increase in size of right adrenal metastasis and subjacent confluent retrocaval lymphadenopathy      02/25/2015 -  Chemotherapy    Nivolumab, zometa      05/04/2015 Imaging    CT CAP- Stable to slight decrease in the posterior right apical lesion. Stable appearance of posterior right upper rib an upper thoracic bony lesions. Slight improvement in right upper lobe tree-in-bud opacity. No new or progressive findings in...      07/28/2015 Imaging    CT CAP- Reduced size of the  right apical pleural parenchymal lesion and reduced size of the right adrenal metastatic lesion. Resolution of prior retrocrural adenopathy.  Right eccentric T1 and T2 sclerosis with sclerosis and tapering of the right second..      11/17/2015 Imaging    CT CAP- Stable soft tissue thickening in the apex of the right hemi thorax. Stable right adrenal metastasis. Nodularity along the trachea and mainstem bronchi, relatively new from 07/28/2015, favoring adherent debris.      11/18/2015 Treatment Plan Change    Zometa HELD for upcoming tooth extraction      11/24/2015 Treatment Plan Change    Zometa on hold at this time in preparation for tooth extraction in March 2017.  Zometa las given on 11/18/2015.      02/03/2016 Imaging    CT CAP- Heterogeneous right apical masslike consolidation and right adrenal metastasis are unchanged      04/06/2016 Treatment Plan Change    Zometa restarted 6 weeks out from tooth extraction (04/06/2016)      05/12/2016 Imaging    CT CAP- NED in the chest, abdomen or pelvis.  Some areas of nodularity associated with the mainstem bronchi in the left upper lobe bronchus, favored to represent adherent inspissated secretions      08/17/2016 Imaging    CT CAP- 1. Stable CTs of the chest and abdomen. No evidence of progressive metastatic disease. 2. Probable treated tumor at the right apex, right adrenal gland and  T2 vertebral body, stable. 3. Fluctuating nodularity along the walls of the trachea and mainstem bronchi, likely secretions.      12/12/2016 Imaging    Further decrease in size of treated tumor within the right apex. 2. Stable treated tumor involving the right second rib and T2 vertebra. 3. Stable right adrenal gland treated tumor. 4. Emphysema 5. Aortic atherosclerosis      01/04/2017 Imaging    MRI brain- Normal brain MRI.  No intracranial metastatic disease.       She is doing very well.  Continues to tolerate therapy without any complaints.   She denies any diarrhea or constipation.  Denies any blood in her stools or dark sticky stools.  She denies any severe fatigue and continues to perform her ADLs without any difficulties.  She denies any new headaches or pain.  She denies any falls.  Her appetite is stable.  Her weight is stable.  Review of Systems  Constitutional: Negative.  Negative for chills, fever and weight loss.  HENT: Negative.   Eyes: Negative.   Respiratory: Negative.  Negative for cough.   Cardiovascular: Negative.  Negative for chest pain.  Gastrointestinal: Negative.  Negative for blood in stool, constipation, diarrhea, melena, nausea and vomiting.  Genitourinary: Negative.   Musculoskeletal: Negative.  Negative for falls.  Skin: Negative.   Neurological: Negative.  Negative for dizziness, weakness and headaches.  Endo/Heme/Allergies: Negative.   Psychiatric/Behavioral: Negative.     Past Medical History:  Diagnosis Date  . Adrenal mass, right ([place]) 07/28/2014  . Bone metastases ([place]) 04/05/2016  . Cancer ([place])    lung  right  . Diabetes mellitus without complication ([place])   . Hyperlipidemia   . Hypertension   . Lung mass 07/28/2014  . Reflux     Past Surgical History:  Procedure Laterality Date  . APPENDECTOMY    . ESOPHAGOGASTRODUODENOSCOPY N/A 12/09/2014   SUP:JSRPRX esophageal stricture/mild-to-noderate erosive gastritis. negative H.pylori  . FLEXIBLE SIGMOIDOSCOPY  2011   Dr. [name]: hyperplastic polyp  . LUNG BIOPSY Right 07/2014   CT guided  . MALONEY DILATION N/A 12/09/2014   Procedure: [name] DILATION;  Surgeon: [name], MD;  Location: [facility];  Service: Endoscopy;  Laterality: N/A;  . PORTACATH PLACEMENT Left 09/01/2014  . SAVORY DILATION N/A 12/09/2014   Procedure: SAVORY DILATION;  Surgeon: [name], MD;  Location: [facility];  Service: Endoscopy;  Laterality: N/A;    Family History  Problem Relation Age of Onset  . Cancer Sister     Social History   Social  History  . Marital status: Married    Spouse name: N/A  . Number of children: N/A  . Years of education: N/A   Social History Main Topics  . Smoking status: Light Tobacco Smoker    Packs/day: 0.30    Types: Cigarettes  . Smokeless tobacco: Never Used  . Alcohol use No  . Drug use: No  . Sexual activity: Not Asked   Other Topics Concern  . None   Social History Narrative  . None     PHYSICAL EXAMINATION  ECOG PERFORMANCE STATUS: 0 - Asymptomatic  Vitals:   01/11/17 1039  BP: (!) 132/96  Pulse: 90  Resp: 16  Temp: 98.8 F (37.1 C)    GENERAL:alert, no distress, well nourished, well developed, comfortable, cooperative, smiling and accompanied by her cousin. SKIN: skin color, texture, turgor are normal, no rashes or significant lesions HEAD: Normocephalic, No masses, lesions, tenderness or abnormalities EYES:  normal, EOMI, Conjunctiva are pink and non-injected EARS: External ears normal OROPHARYNX:lips, buccal mucosa, and tongue normal and mucous membranes are moist  NECK: supple, no adenopathy, trachea midline LYMPH:  no palpable lymphadenopathy BREAST:not examined LUNGS: clear to auscultation and percussion HEART: regular rate & rhythm, no murmurs, no gallops, S1 normal and S2 normal ABDOMEN:abdomen soft, non-tender, normal bowel sounds and no masses or organomegaly BACK: Back symmetric, no curvature. EXTREMITIES:less then 2 second capillary refill, no joint deformities, effusion, or inflammation, no edema, no skin discoloration, no clubbing, no cyanosis  NEURO: alert & oriented x 3 with fluent speech, no focal motor/sensory deficits, gait normal   LABORATORY DATA: CBC    Component Value Date/Time   WBC 7.0 01/11/2017 0834   RBC 3.95 01/11/2017 0834   HGB 11.8 (L) 01/11/2017 0834   HCT 36.9 01/11/2017 0834   PLT 380 01/11/2017 0834   MCV 93.4 01/11/2017 0834   MCH 29.9 01/11/2017 0834   MCHC 32.0 01/11/2017 0834   RDW 14.7 01/11/2017 0834   LYMPHSABS 1.4  01/11/2017 0834   MONOABS 0.6 01/11/2017 0834   EOSABS 0.1 01/11/2017 0834   BASOSABS 0.0 01/11/2017 0834      Chemistry      Component Value Date/Time   NA 136 01/11/2017 0834   K 4.2 01/11/2017 0834   CL 102 01/11/2017 0834   CO2 26 01/11/2017 0834   BUN 11 01/11/2017 0834   CREATININE 1.30 (H) 01/11/2017 0834      Component Value Date/Time   CALCIUM 10.3 01/11/2017 0834   CALCIUM 9.7 06/03/2015 1000   ALKPHOS 67 01/11/2017 0834   AST 17 01/11/2017 0834   ALT 9 (L) 01/11/2017 0834   BILITOT 0.4 01/11/2017 0834        PENDING LABS:   RADIOGRAPHIC STUDIES:  Ct Chest W Contrast  Result Date: 12/12/2016 CLINICAL DATA:  Followup right lung mass. Status post chemotherapy and radiation. EXAM: CT CHEST, ABDOMEN, AND PELVIS WITH CONTRAST TECHNIQUE: Multidetector CT imaging of the chest, abdomen and pelvis was performed following the standard protocol during bolus administration of intravenous contrast. CONTRAST:  57m ISOVUE-300 IOPAMIDOL (ISOVUE-300) INJECTION 61% COMPARISON:  08/17/2016 FINDINGS: CT CHEST FINDINGS Cardiovascular: The heart size appears normal. Aortic atherosclerosis noted. Mediastinum/Nodes: The trachea appears patent and is midline. Normal appearance of the esophagus. No mediastinal adenopathy. Right hilar node measures 9 mm, image 22 of series 2. Unchanged from previous exam. Lungs/Pleura: No pleural fluid. Advanced changes of centrilobular emphysema. Right upper lobe lung mass which appears partially calcified Measures 3.2 x 2.5 by 2.2 cm. On the previous exam this measured 3.6 x 2.8 by 2.9 cm. Impacted distal bronchial within the anteromedial left upper lobe is unchanged, image 65 of series 3. No new or progressive disease identified Musculoskeletal: Sclerotic changes involving the posterior aspect of the right second rib appears similar to previous exam. Asymmetric sclerosis involving the right side of the T2 vertebra is unchanged from previous study. No new or  progressive disease identified within the visualized osseous structures. CT ABDOMEN PELVIS FINDINGS Hepatobiliary: No focal liver abnormality is seen. No gallstones, gallbladder wall thickening, or biliary dilatation. Pancreas: Unremarkable. No pancreatic ductal dilatation or surrounding inflammatory changes. Spleen: Normal in size without focal abnormality. Adrenals/Urinary Tract: The left adrenal gland appears normal. Partially calcified lesion involving the right adrenal gland measures 3.3 x 2.7 cm, image 52 of series 2. Previously this was measured at 3.7 x 2.4 cm. Normal appearance of the adrenal glands. Urinary bladder is normal. Stomach/Bowel:  The stomach is normal. The small bowel loops have a normal course and caliber. No pathologic dilatation of the proximal colon. Distal colonic diverticula noted without acute inflammation. Vascular/Lymphatic: Aortic atherosclerosis. No enlarged retroperitoneal or mesenteric adenopathy. No enlarged pelvic or inguinal lymph nodes. Reproductive: Uterus and bilateral adnexa are unremarkable. Other: No abdominal wall hernia or abnormality. No abdominopelvic ascites. Musculoskeletal: No aggressive lytic or sclerotic bone lesions identified. IMPRESSION: 1. Further decrease in size of treated tumor within the right apex. 2. Stable treated tumor involving the right second rib and T2 vertebra. 3. Stable right adrenal gland treated tumor. 4. Emphysema 5. Aortic atherosclerosis. Electronically Signed   By: Kerby Moors M.D.   On: 12/12/2016 15:03   Mr Jeri Cos UD Contrast  Result Date: 01/04/2017 CLINICAL DATA:  Right hand weakness for 2 weeks.  Lung cancer. EXAM: MRI HEAD WITHOUT AND WITH CONTRAST TECHNIQUE: Multiplanar, multiecho pulse sequences of the brain and surrounding structures were obtained without and with intravenous contrast. CONTRAST:  7m MULTIHANCE GADOBENATE DIMEGLUMINE 529 MG/ML IV SOLN COMPARISON:  Head CT 12/02/2014 FINDINGS: Brain: No focal diffusion  restriction to indicate acute infarct. No intraparenchymal hemorrhage. The brain parenchymal signal is normal. No mass lesion or midline shift. No hydrocephalus or extra-axial fluid collection. The midline structures are normal. No age advanced or lobar predominant atrophy. Vascular: Major intracranial arterial and venous sinus flow voids are preserved. No evidence of chronic microhemorrhage or amyloid angiopathy. Skull and upper cervical spine: The visualized skull base, calvarium, upper cervical spine and extracranial soft tissues are normal. Sinuses/Orbits: No fluid levels or advanced mucosal thickening. No mastoid effusion. Normal orbits. IMPRESSION: Normal brain MRI.  No intracranial metastatic disease. Electronically Signed   By: KUlyses JarredM.D.   On: 01/04/2017 15:10   Ct Abdomen Pelvis W Contrast  Result Date: 12/12/2016 CLINICAL DATA:  Followup right lung mass. Status post chemotherapy and radiation. EXAM: CT CHEST, ABDOMEN, AND PELVIS WITH CONTRAST TECHNIQUE: Multidetector CT imaging of the chest, abdomen and pelvis was performed following the standard protocol during bolus administration of intravenous contrast. CONTRAST:  865mISOVUE-300 IOPAMIDOL (ISOVUE-300) INJECTION 61% COMPARISON:  08/17/2016 FINDINGS: CT CHEST FINDINGS Cardiovascular: The heart size appears normal. Aortic atherosclerosis noted. Mediastinum/Nodes: The trachea appears patent and is midline. Normal appearance of the esophagus. No mediastinal adenopathy. Right hilar node measures 9 mm, image 22 of series 2. Unchanged from previous exam. Lungs/Pleura: No pleural fluid. Advanced changes of centrilobular emphysema. Right upper lobe lung mass which appears partially calcified Measures 3.2 x 2.5 by 2.2 cm. On the previous exam this measured 3.6 x 2.8 by 2.9 cm. Impacted distal bronchial within the anteromedial left upper lobe is unchanged, image 65 of series 3. No new or progressive disease identified Musculoskeletal: Sclerotic  changes involving the posterior aspect of the right second rib appears similar to previous exam. Asymmetric sclerosis involving the right side of the T2 vertebra is unchanged from previous study. No new or progressive disease identified within the visualized osseous structures. CT ABDOMEN PELVIS FINDINGS Hepatobiliary: No focal liver abnormality is seen. No gallstones, gallbladder wall thickening, or biliary dilatation. Pancreas: Unremarkable. No pancreatic ductal dilatation or surrounding inflammatory changes. Spleen: Normal in size without focal abnormality. Adrenals/Urinary Tract: The left adrenal gland appears normal. Partially calcified lesion involving the right adrenal gland measures 3.3 x 2.7 cm, image 52 of series 2. Previously this was measured at 3.7 x 2.4 cm. Normal appearance of the adrenal glands. Urinary bladder is normal. Stomach/Bowel: The stomach is normal. The  small bowel loops have a normal course and caliber. No pathologic dilatation of the proximal colon. Distal colonic diverticula noted without acute inflammation. Vascular/Lymphatic: Aortic atherosclerosis. No enlarged retroperitoneal or mesenteric adenopathy. No enlarged pelvic or inguinal lymph nodes. Reproductive: Uterus and bilateral adnexa are unremarkable. Other: No abdominal wall hernia or abnormality. No abdominopelvic ascites. Musculoskeletal: No aggressive lytic or sclerotic bone lesions identified. IMPRESSION: 1. Further decrease in size of treated tumor within the right apex. 2. Stable treated tumor involving the right second rib and T2 vertebra. 3. Stable right adrenal gland treated tumor. 4. Emphysema 5. Aortic atherosclerosis. Electronically Signed   By: Kerby Moors M.D.   On: 12/12/2016 15:03     PATHOLOGY:    ASSESSMENT AND PLAN:  Cancer of upper lobe of right lung Stage IV right adenocarcinoma pancoast tumor of the lung (EGFR and ALK negative) complicated by right brachial plexus neuralgia with disease on  presentation including bilateral adrenal metastases, retroperitoneal lymph nodes.  S/P Cisplatin/Pemetrexed/Avastin x 4 cycles 09/02/2014- 11/04/2014 with XRT on 10/07/2014- 10/27/2014 to right lung apex for control of brachioplexopathy followed by Alimta/Avastin maintenance therapy from 12/24/2014- 02/25/2015.  Progression of disease noted on 02/20/2015 resulting in a change of therapy to Nivolumab on 02/25/2015.  She remains on Nivolumab at this time.  She is on Xgeva for her bone metastases.  Oncology history is updated.  Pre-treatment labs today: CBC diff, CMET, TSH.  I personally reviewed and went over laboratory results with the patient.  The results are noted within this dictation. Pre-chemotherapy labs today satisfy treatment criteria.    Xgeva today and monthly.  I personally reviewed and went over radiographic studies with the patient.  The results are noted within this dictation.  MRI brain is negative for any malignancy.  Smoking cessation education is provided.  She does not smoke every day.  She notes that she smokes only under stress.  Return in 4 weeks for follow-up with ongoing treatment every 2 weeks.   ORDERS PLACED FOR THIS ENCOUNTER: Orders Placed This Encounter  Procedures  . TSH  . TSH    MEDICATIONS PRESCRIBED THIS ENCOUNTER: No orders of the defined types were placed in this encounter.   THERAPY PLAN:  Continue with Nivolumab every 2 weeks and Xgeva every 4 weeks.  All questions were answered. The patient knows to call the clinic with any problems, questions or concerns. We can certainly see the patient much sooner if necessary.  Patient and plan discussed with Dr. Twana First and she is in agreement with the aforementioned.   This note is electronically signed by: Doy Mince 01/11/2017 11:04 AM

## 2017-01-11 NOTE — Assessment & Plan Note (Signed)
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  S/P Cisplatin/Pemetrexed/Avastin x 4 cycles 09/02/2014- 11/04/2014 with XRT on 10/07/2014- 10/27/2014 to right lung apex for control of brachioplexopathy followed by Alimta/Avastin maintenance therapy from 12/24/2014- 02/25/2015.  Progression of disease noted on 02/20/2015 resulting in a change of therapy to Nivolumab on 02/25/2015.  She remains on Nivolumab at this time.  She is on Xgeva for her bone metastases.  Oncology history is updated.  Pre-treatment labs today: CBC diff, CMET, TSH.  I personally reviewed and went over laboratory results with the patient.  The results are noted within this dictation. Pre-chemotherapy labs today satisfy treatment criteria.    Xgeva today and monthly.  I personally reviewed and went over radiographic studies with the patient.  The results are noted within this dictation.  MRI brain is negative for any malignancy.  Smoking cessation education is provided.  She does not smoke every day.  She notes that she smokes only under stress.  Return in 4 weeks for follow-up with ongoing treatment every 2 weeks.

## 2017-01-11 NOTE — Patient Instructions (Signed)
[facility] Discharge Instructions for Patients Receiving Chemotherapy   Beginning January 23rd 2017 lab work for the Cancer Center will be done in the  Main lab at [facility] on 1st floor. If you have a lab appointment with the Cancer Center please come in thru the  Main Entrance and check in at the main information desk   Today you received the following chemotherapy agents   To help prevent nausea and vomiting after your treatment, we encourage you to take your nausea medication     If you develop nausea and vomiting, or diarrhea that is not controlled by your medication, call the clinic.  The clinic phone number is [phone]. Office hours are Monday-Friday 8:30am-5:00pm.  BELOW ARE SYMPTOMS THAT SHOULD BE REPORTED IMMEDIATELY:  *FEVER GREATER THAN 101.0 F  *CHILLS WITH OR WITHOUT FEVER  NAUSEA AND VOMITING THAT IS NOT CONTROLLED WITH YOUR NAUSEA MEDICATION  *UNUSUAL SHORTNESS OF BREATH  *UNUSUAL BRUISING OR BLEEDING  TENDERNESS IN MOUTH AND THROAT WITH OR WITHOUT PRESENCE OF ULCERS  *URINARY PROBLEMS  *BOWEL PROBLEMS  UNUSUAL RASH Items with * indicate a potential emergency and should be followed up as soon as possible. If you have an emergency after office hours please contact your primary care physician or go to the nearest emergency department.  Please call the clinic during office hours if you have any questions or concerns.   You may also contact the Patient Navigator at [phone] should you have any questions or need assistance in obtaining follow up care.      Resources For Cancer Patients and their Caregivers ? American Cancer Society: Can assist with transportation, wigs, general needs, runs Look Good Feel Better.        1-888-227-6333 ? Cancer Care: Provides financial assistance, online support groups, medication/co-pay assistance.  1-800-813-HOPE (4673) ? [facility] Assists [place] cancer  patients and their families through emotional , educational and financial support.  336-427-4357 ? [place] DSS Where to apply for food stamps, Medicaid and utility assistance. 336-342-1394 ? [facility]: Transportation to medical appointments. 336-347-2287 ? Social Security Administration: May apply for disability if have a Stage IV cancer. 336-342-7796 1-800-772-1213 ? [place] Aging, Disability and Transit Services: Assists with nutrition, care and transit needs. 336-349-2343         

## 2017-01-12 ENCOUNTER — Other Ambulatory Visit (HOSPITAL_COMMUNITY): Payer: Self-pay | Admitting: Oncology

## 2017-01-14 ENCOUNTER — Other Ambulatory Visit (HOSPITAL_COMMUNITY): Payer: Self-pay | Admitting: Oncology

## 2017-01-25 ENCOUNTER — Encounter (HOSPITAL_COMMUNITY): Payer: 59

## 2017-01-25 ENCOUNTER — Encounter (HOSPITAL_COMMUNITY): Payer: 59 | Attending: Oncology

## 2017-01-25 ENCOUNTER — Other Ambulatory Visit (HOSPITAL_COMMUNITY): Payer: Self-pay | Admitting: Oncology

## 2017-01-25 ENCOUNTER — Encounter (HOSPITAL_COMMUNITY): Payer: Self-pay

## 2017-01-25 VITALS — BP 115/66 | HR 70 | Temp 98.0°F | Resp 18 | Wt 96.0 lb

## 2017-01-25 DIAGNOSIS — E278 Other specified disorders of adrenal gland: Secondary | ICD-10-CM

## 2017-01-25 DIAGNOSIS — I10 Essential (primary) hypertension: Secondary | ICD-10-CM | POA: Insufficient documentation

## 2017-01-25 DIAGNOSIS — R7989 Other specified abnormal findings of blood chemistry: Secondary | ICD-10-CM

## 2017-01-25 DIAGNOSIS — C7951 Secondary malignant neoplasm of bone: Secondary | ICD-10-CM

## 2017-01-25 DIAGNOSIS — G5603 Carpal tunnel syndrome, bilateral upper limbs: Secondary | ICD-10-CM | POA: Insufficient documentation

## 2017-01-25 DIAGNOSIS — C3411 Malignant neoplasm of upper lobe, right bronchus or lung: Secondary | ICD-10-CM

## 2017-01-25 DIAGNOSIS — F17210 Nicotine dependence, cigarettes, uncomplicated: Secondary | ICD-10-CM | POA: Insufficient documentation

## 2017-01-25 DIAGNOSIS — C7972 Secondary malignant neoplasm of left adrenal gland: Secondary | ICD-10-CM

## 2017-01-25 DIAGNOSIS — Z923 Personal history of irradiation: Secondary | ICD-10-CM | POA: Diagnosis not present

## 2017-01-25 DIAGNOSIS — E119 Type 2 diabetes mellitus without complications: Secondary | ICD-10-CM | POA: Diagnosis not present

## 2017-01-25 DIAGNOSIS — Z5112 Encounter for antineoplastic immunotherapy: Secondary | ICD-10-CM

## 2017-01-25 DIAGNOSIS — E039 Hypothyroidism, unspecified: Secondary | ICD-10-CM | POA: Insufficient documentation

## 2017-01-25 DIAGNOSIS — Z9889 Other specified postprocedural states: Secondary | ICD-10-CM | POA: Insufficient documentation

## 2017-01-25 DIAGNOSIS — Z9221 Personal history of antineoplastic chemotherapy: Secondary | ICD-10-CM | POA: Insufficient documentation

## 2017-01-25 DIAGNOSIS — C7971 Secondary malignant neoplasm of right adrenal gland: Secondary | ICD-10-CM | POA: Diagnosis not present

## 2017-01-25 DIAGNOSIS — K219 Gastro-esophageal reflux disease without esophagitis: Secondary | ICD-10-CM | POA: Insufficient documentation

## 2017-01-25 DIAGNOSIS — E785 Hyperlipidemia, unspecified: Secondary | ICD-10-CM | POA: Insufficient documentation

## 2017-01-25 LAB — COMPREHENSIVE METABOLIC PANEL
ALT: 9 U/L — AB (ref 14–54)
AST: 14 U/L — ABNORMAL LOW (ref 15–41)
Albumin: 4.1 g/dL (ref 3.5–5.0)
Alkaline Phosphatase: 60 U/L (ref 38–126)
Anion gap: 7 (ref 5–15)
BILIRUBIN TOTAL: 0.3 mg/dL (ref 0.3–1.2)
BUN: 15 mg/dL (ref 6–20)
CALCIUM: 10.2 mg/dL (ref 8.9–10.3)
CO2: 24 mmol/L (ref 22–32)
CREATININE: 1.33 mg/dL — AB (ref 0.44–1.00)
Chloride: 105 mmol/L (ref 101–111)
GFR calc Af Amer: 49 mL/min — ABNORMAL LOW (ref >60)
GFR, EST NON AFRICAN AMERICAN: 42 mL/min — AB (ref >60)
Glucose, Bld: 97 mg/dL (ref 65–99)
Potassium: 5.1 mmol/L (ref 3.5–5.1)
Sodium: 136 mmol/L (ref 135–145)
TOTAL PROTEIN: 7.8 g/dL (ref 6.5–8.1)

## 2017-01-25 LAB — CBC WITH DIFFERENTIAL/PLATELET
BASOS ABS: 0 10*3/uL (ref 0.0–0.1)
Basophils Relative: 0 %
Eosinophils Absolute: 0.2 10*3/uL (ref 0.0–0.7)
Eosinophils Relative: 2 %
HEMATOCRIT: 35.9 % — AB (ref 36.0–46.0)
Hemoglobin: 11.8 g/dL — ABNORMAL LOW (ref 12.0–15.0)
LYMPHS ABS: 2.5 10*3/uL (ref 0.7–4.0)
LYMPHS PCT: 27 %
MCH: 30.4 pg (ref 26.0–34.0)
MCHC: 32.9 g/dL (ref 30.0–36.0)
MCV: 92.5 fL (ref 78.0–100.0)
MONO ABS: 0.8 10*3/uL (ref 0.1–1.0)
Monocytes Relative: 8 %
NEUTROS ABS: 5.6 10*3/uL (ref 1.7–7.7)
Neutrophils Relative %: 63 %
Platelets: 297 10*3/uL (ref 150–400)
RBC: 3.88 MIL/uL (ref 3.87–5.11)
RDW: 15.6 % — ABNORMAL HIGH (ref 11.5–15.5)
WBC: 9 10*3/uL (ref 4.0–10.5)

## 2017-01-25 LAB — TSH: TSH: 4.651 u[IU]/mL — AB (ref 0.350–4.500)

## 2017-01-25 MED ORDER — SODIUM CHLORIDE 0.9 % IV SOLN
Freq: Once | INTRAVENOUS | Status: AC
  Administered 2017-01-25: 12:00:00 via INTRAVENOUS

## 2017-01-25 MED ORDER — HEPARIN SOD (PORK) LOCK FLUSH 100 UNIT/ML IV SOLN
500.0000 [IU] | Freq: Once | INTRAVENOUS | Status: AC | PRN
  Administered 2017-01-25: 500 [IU]

## 2017-01-25 MED ORDER — OXYCODONE HCL 10 MG PO TABS: 10.0000 mg | ORAL_TABLET | Freq: Four times a day (QID) | ORAL | 0 refills | Status: DC | PRN

## 2017-01-25 MED ORDER — SODIUM CHLORIDE 0.9 % IV SOLN
240.0000 mg | Freq: Once | INTRAVENOUS | Status: AC
  Administered 2017-01-25: 240 mg via INTRAVENOUS
  Filled 2017-01-25: qty 24

## 2017-01-25 NOTE — Progress Notes (Signed)
Caroleen Hamman, PA-C notified of TSH results.  Will repeat TSH and Free T4 in one week per PA.   Tolerated infusion w/o adverse reaction.  Alert, in no distress.  VSS.  Discharged ambulatory in c/o family.

## 2017-01-25 NOTE — Patient Instructions (Signed)
[facility] Discharge Instructions for Patients Receiving Chemotherapy   Beginning January 23rd 2017 lab work for the Cancer Center will be done in the  Main lab at [facility] on 1st floor. If you have a lab appointment with the Cancer Center please come in thru the  Main Entrance and check in at the main information desk   Today you received the following chemotherapy agents:  Opdivo  If you develop nausea and vomiting, or diarrhea that is not controlled by your medication, call the clinic.  The clinic phone number is [phone]. Office hours are Monday-Friday 8:30am-5:00pm.  BELOW ARE SYMPTOMS THAT SHOULD BE REPORTED IMMEDIATELY:  *FEVER GREATER THAN 101.0 F  *CHILLS WITH OR WITHOUT FEVER  NAUSEA AND VOMITING THAT IS NOT CONTROLLED WITH YOUR NAUSEA MEDICATION  *UNUSUAL SHORTNESS OF BREATH  *UNUSUAL BRUISING OR BLEEDING  TENDERNESS IN MOUTH AND THROAT WITH OR WITHOUT PRESENCE OF ULCERS  *URINARY PROBLEMS  *BOWEL PROBLEMS  UNUSUAL RASH Items with * indicate a potential emergency and should be followed up as soon as possible. If you have an emergency after office hours please contact your primary care physician or go to the nearest emergency department.  Please call the clinic during office hours if you have any questions or concerns.   You may also contact the Patient Navigator at [phone] should you have any questions or need assistance in obtaining follow up care.      Resources For Cancer Patients and their Caregivers ? American Cancer Society: Can assist with transportation, wigs, general needs, runs Look Good Feel Better.        1-888-227-6333 ? Cancer Care: Provides financial assistance, online support groups, medication/co-pay assistance.  1-800-813-HOPE (4673) ? [facility] Assists [place] cancer patients and their families through emotional , educational and financial support.   336-427-4357 ? [place] DSS Where to apply for food stamps, Medicaid and utility assistance. 336-342-1394 ? [facility]: Transportation to medical appointments. 336-347-2287 ? Social Security Administration: May apply for disability if have a Stage IV cancer. 336-342-7796 1-800-772-1213 ? [place] [facility]ging, Disability and Transit Services: Assists with nutrition, care and transit needs. 336-349-2343         

## 2017-01-30 ENCOUNTER — Encounter (HOSPITAL_COMMUNITY): Payer: 59

## 2017-01-30 ENCOUNTER — Other Ambulatory Visit (HOSPITAL_COMMUNITY): Payer: Self-pay | Admitting: Oncology

## 2017-01-30 DIAGNOSIS — E032 Hypothyroidism due to medicaments and other exogenous substances: Secondary | ICD-10-CM

## 2017-01-30 DIAGNOSIS — C3411 Malignant neoplasm of upper lobe, right bronchus or lung: Secondary | ICD-10-CM | POA: Diagnosis not present

## 2017-01-30 DIAGNOSIS — R7989 Other specified abnormal findings of blood chemistry: Secondary | ICD-10-CM

## 2017-01-30 HISTORY — DX: Hypothyroidism due to medicaments and other exogenous substances: E03.2

## 2017-01-30 LAB — T4, FREE: FREE T4: 0.73 ng/dL (ref 0.61–1.12)

## 2017-01-30 LAB — TSH: TSH: 7.406 u[IU]/mL — ABNORMAL HIGH (ref 0.350–4.500)

## 2017-01-30 MED ORDER — LEVOTHYROXINE SODIUM 25 MCG PO TABS: 25.0000 ug | ORAL_TABLET | Freq: Every day | ORAL | 1 refills | Status: DC

## 2017-02-03 ENCOUNTER — Other Ambulatory Visit (HOSPITAL_COMMUNITY): Payer: Self-pay | Admitting: Podiatry

## 2017-02-03 DIAGNOSIS — M869 Osteomyelitis, unspecified: Secondary | ICD-10-CM

## 2017-02-08 ENCOUNTER — Encounter (HOSPITAL_BASED_OUTPATIENT_CLINIC_OR_DEPARTMENT_OTHER): Payer: 59 | Admitting: Oncology

## 2017-02-08 ENCOUNTER — Encounter (HOSPITAL_COMMUNITY): Payer: 59

## 2017-02-08 ENCOUNTER — Encounter (HOSPITAL_BASED_OUTPATIENT_CLINIC_OR_DEPARTMENT_OTHER): Payer: 59

## 2017-02-08 ENCOUNTER — Encounter (HOSPITAL_COMMUNITY): Payer: Self-pay | Admitting: Oncology

## 2017-02-08 VITALS — BP 99/63 | HR 84 | Temp 98.4°F | Resp 16

## 2017-02-08 VITALS — BP 114/93 | HR 92 | Temp 98.6°F | Resp 16 | Ht 59.0 in | Wt 95.3 lb

## 2017-02-08 DIAGNOSIS — C3411 Malignant neoplasm of upper lobe, right bronchus or lung: Secondary | ICD-10-CM

## 2017-02-08 DIAGNOSIS — C7951 Secondary malignant neoplasm of bone: Secondary | ICD-10-CM

## 2017-02-08 DIAGNOSIS — Z5112 Encounter for antineoplastic immunotherapy: Secondary | ICD-10-CM

## 2017-02-08 DIAGNOSIS — F17210 Nicotine dependence, cigarettes, uncomplicated: Secondary | ICD-10-CM | POA: Diagnosis not present

## 2017-02-08 DIAGNOSIS — E039 Hypothyroidism, unspecified: Secondary | ICD-10-CM | POA: Diagnosis not present

## 2017-02-08 DIAGNOSIS — E278 Other specified disorders of adrenal gland: Secondary | ICD-10-CM

## 2017-02-08 DIAGNOSIS — E032 Hypothyroidism due to medicaments and other exogenous substances: Secondary | ICD-10-CM

## 2017-02-08 LAB — COMPREHENSIVE METABOLIC PANEL
ALBUMIN: 4.2 g/dL (ref 3.5–5.0)
ALT: 10 U/L — ABNORMAL LOW (ref 14–54)
AST: 17 U/L (ref 15–41)
Alkaline Phosphatase: 61 U/L (ref 38–126)
Anion gap: 6 (ref 5–15)
BUN: 15 mg/dL (ref 6–20)
CHLORIDE: 101 mmol/L (ref 101–111)
CO2: 27 mmol/L (ref 22–32)
Calcium: 10.2 mg/dL (ref 8.9–10.3)
Creatinine, Ser: 1.31 mg/dL — ABNORMAL HIGH (ref 0.44–1.00)
GFR calc Af Amer: 50 mL/min — ABNORMAL LOW (ref >60)
GFR, EST NON AFRICAN AMERICAN: 43 mL/min — AB (ref >60)
Glucose, Bld: 97 mg/dL (ref 65–99)
POTASSIUM: 4.2 mmol/L (ref 3.5–5.1)
Sodium: 134 mmol/L — ABNORMAL LOW (ref 135–145)
Total Bilirubin: 0.4 mg/dL (ref 0.3–1.2)
Total Protein: 7.9 g/dL (ref 6.5–8.1)

## 2017-02-08 LAB — CBC WITH DIFFERENTIAL/PLATELET
Basophils Absolute: 0 10*3/uL (ref 0.0–0.1)
Basophils Relative: 0 %
Eosinophils Absolute: 0.2 10*3/uL (ref 0.0–0.7)
Eosinophils Relative: 2 %
HEMATOCRIT: 36 % (ref 36.0–46.0)
HEMOGLOBIN: 11.9 g/dL — AB (ref 12.0–15.0)
LYMPHS ABS: 1.9 10*3/uL (ref 0.7–4.0)
Lymphocytes Relative: 21 %
MCH: 30.4 pg (ref 26.0–34.0)
MCHC: 33.1 g/dL (ref 30.0–36.0)
MCV: 91.8 fL (ref 78.0–100.0)
MONOS PCT: 9 %
Monocytes Absolute: 0.8 10*3/uL (ref 0.1–1.0)
Neutro Abs: 6 10*3/uL (ref 1.7–7.7)
Neutrophils Relative %: 68 %
Platelets: 286 10*3/uL (ref 150–400)
RBC: 3.92 MIL/uL (ref 3.87–5.11)
RDW: 15.8 % — ABNORMAL HIGH (ref 11.5–15.5)
WBC: 9 10*3/uL (ref 4.0–10.5)

## 2017-02-08 LAB — TSH: TSH: 2.814 u[IU]/mL (ref 0.350–4.500)

## 2017-02-08 MED ORDER — HEPARIN SOD (PORK) LOCK FLUSH 100 UNIT/ML IV SOLN
INTRAVENOUS | Status: AC
  Filled 2017-02-08: qty 5

## 2017-02-08 MED ORDER — SODIUM CHLORIDE 0.9 % IV SOLN
Freq: Once | INTRAVENOUS | Status: AC
  Administered 2017-02-08: 10:00:00 via INTRAVENOUS

## 2017-02-08 MED ORDER — DENOSUMAB 120 MG/1.7ML ~~LOC~~ SOLN
120.0000 mg | Freq: Once | SUBCUTANEOUS | Status: AC
  Administered 2017-02-08: 120 mg via SUBCUTANEOUS
  Filled 2017-02-08: qty 1.7

## 2017-02-08 MED ORDER — HEPARIN SOD (PORK) LOCK FLUSH 100 UNIT/ML IV SOLN
500.0000 [IU] | Freq: Once | INTRAVENOUS | Status: AC | PRN
  Administered 2017-02-08: 500 [IU]

## 2017-02-08 MED ORDER — SODIUM CHLORIDE 0.9 % IV SOLN
240.0000 mg | Freq: Once | INTRAVENOUS | Status: AC
  Administered 2017-02-08: 240 mg via INTRAVENOUS
  Filled 2017-02-08: qty 24

## 2017-02-08 NOTE — Assessment & Plan Note (Signed)
Bone metastases on original staging PET scan.    Xgeva today and monthly.

## 2017-02-08 NOTE — Patient Instructions (Signed)
[facility] Discharge Instructions for Patients Receiving Chemotherapy   Beginning January 23rd 2017 lab work for the [facility] will be done in the  Main lab at [facility] on 1st floor. If you have a lab appointment with the [facility] please come in thru the  Main Entrance and check in at the main information desk   Today you received the following chemotherapy agents opdivo. Xgeva injection given as ordered.  If you develop nausea and vomiting, or diarrhea that is not controlled by your medication, call the clinic.  The clinic phone number is [phone]. Office hours are Monday-Friday 8:30am-5:00pm.  BELOW ARE SYMPTOMS THAT SHOULD BE REPORTED IMMEDIATELY:  *FEVER GREATER THAN 101.0 F  *CHILLS WITH OR WITHOUT FEVER  NAUSEA AND VOMITING THAT IS NOT CONTROLLED WITH YOUR NAUSEA MEDICATION  *UNUSUAL SHORTNESS OF BREATH  *UNUSUAL BRUISING OR BLEEDING  TENDERNESS IN MOUTH AND THROAT WITH OR WITHOUT PRESENCE OF ULCERS  *URINARY PROBLEMS  *BOWEL PROBLEMS  UNUSUAL RASH Items with * indicate a potential emergency and should be followed up as soon as possible. If you have an emergency after office hours please contact your primary care physician or go to the nearest emergency department.  Please call the clinic during office hours if you have any questions or concerns.   You may also contact the Patient Navigator at [phone] should you have any questions or need assistance in obtaining follow up care.      Resources For Cancer Patients and their Caregivers ? American Cancer Society: Can assist with transportation, wigs, general needs, runs Look Good Feel Better.        6514157900 ? Cancer Care: Provides financial assistance, online support groups, medication/co-pay assistance.  1-800-813-HOPE 418-626-8683) ? [place] Assists [place] cancer patients and their families through emotional , educational and financial  support.  279-289-5824 ? [place] DSS Where to apply for food stamps, Medicaid and utility assistance. 267 130 4032 ? [employer]: Transportation to medical appointments. (623)533-2843 ? Social Security Administration: May apply for disability if have a Stage IV cancer. 431-174-3695 (818)271-2492 ? [employer], Disability and Transit Services: Assists with nutrition, care and transit needs. [phone]

## 2017-02-08 NOTE — Assessment & Plan Note (Signed)
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  On levothyroxine.  TSH every 2 weeks with treatment.  Continue Levothyroxine 25 mcg daily.

## 2017-02-08 NOTE — Progress Notes (Signed)
Leonard Downing presents today for injection per MD orders. Xgeva 120 mg administered SQ in right Abdomen. Administration without incident. Patient tolerated well.  Tolerated chemo well. Stable and ambulatory on discharge home to self.

## 2017-02-08 NOTE — Assessment & Plan Note (Addendum)
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  S/P Cisplatin/Pemetrexed/Avastin x 4 cycles 09/02/2014- 11/04/2014 with XRT on 10/07/2014- 10/27/2014 to right lung apex for control of brachioplexopathy followed by Alimta/Avastin maintenance therapy from 12/24/2014- 02/25/2015.  Progression of disease noted on 02/20/2015 resulting in a change of therapy to Nivolumab on 02/25/2015.  She remains on Nivolumab at this time.  She is on Xgeva for her bone metastases.  Oncology history is updated.  Pre-treatment labs today: CBC diff, CMET, TSH.  I personally reviewed and went over laboratory results with the patient.  The results are noted within this dictation. Pre-chemotherapy labs today satisfy treatment criteria.    Xgeva today and monthly.  She is due for repeat imaging.  Orders are placed for CT CAP w contrast in 3-4 weeks.  Smoking cessation education is provided.  She does not smoke every day.  She notes that she smokes only under stress.  She is seeing Dr. Caprice Beaver, podiatry, for a right #1 toe infection.  Xrays were performed in his office.  Patient is set-up for an MRI tomorrow to further evaluate this issue.  Return in 4 weeks for follow-up with ongoing treatment every 2 weeks.

## 2017-02-08 NOTE — Patient Instructions (Addendum)
[place] at [facility] Discharge Instructions  RECOMMENDATIONS MADE BY THE CONSULTANT AND ANY TEST RESULTS WILL BE SENT TO YOUR REFERRING PHYSICIAN.  You were seen today by Kirby Crigler PA-C. Continue Levothyroxine. Treatment and Xgeva today. Return in 1 month for follow up and treatment.    Thank you for choosing [place] at [facility] to provide your oncology and hematology care.  To afford each patient quality time with our provider, please arrive at least 15 minutes before your scheduled appointment time.    If you have a lab appointment with the [facility] please come in thru the  Main Entrance and check in at the main information desk  You need to re-schedule your appointment should you arrive 10 or more minutes late.  We strive to give you quality time with our providers, and arriving late affects you and other patients whose appointments are after yours.  Also, if you no show three or more times for appointments you may be dismissed from the clinic at the providers discretion.     Again, thank you for choosing [facility].  Our hope is that these requests will decrease the amount of time that you wait before being seen by our physicians.       _____________________________________________________________  Should you have questions after your visit to [facility], please contact our office at [phone] between the hours of 8:30 a.m. and 4:30 p.m.  Voicemails left after 4:30 p.m. will not be returned until the following business day.  For prescription refill requests, have your pharmacy contact our office.       Resources For Cancer Patients and their Caregivers ? American Cancer Society: Can assist with transportation, wigs, general needs, runs Look Good Feel Better.        [phone] ? Cancer Care: Provides financial assistance, online support groups, medication/co-pay assistance.   1-800-813-HOPE 774 709 7725) ? [place] Assists [place] cancer patients and their families through emotional , educational and financial support.  616-509-9379 ? [place] DSS Where to apply for food stamps, Medicaid and utility assistance. (574)439-7688 ? [employer]: Transportation to medical appointments. 939-766-2318 ? Social Security Administration: May apply for disability if have a Stage IV cancer. [phone] 604-107-3690 ? [employer], Disability and Transit Services: Assists with nutrition, care and transit needs. [place] Support Programs: '@10RELATIVEDAYS'$ @ > Cancer Support Group  2nd Tuesday of the month 1pm-2pm, Journey Room  > Creative Journey  3rd Tuesday of the month 1130am-1pm, Journey Room  > Look Good Feel Better  1st Wednesday of the month 10am-12 noon, Journey Room (Call [facility] to register [phone])

## 2017-02-08 NOTE — Progress Notes (Signed)
Melissa Neighbors, MD [address] [place] [address]  Cancer of upper lobe of right lung [facility]) - Plan: CBC with Differential, Comprehensive metabolic panel, TSH, CT Chest W Contrast, CT Abdomen W Contrast  Bone metastases ([place])  Hypothyroidism due to medication - Plan: TSH  CURRENT THERAPY: Nivolumab every 2 weeks.  Xgeva every 4 weeks.  INTERVAL HISTORY: Melissa Sullivan 61 y.o. 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    Cancer of upper lobe of right lung ([place])   07/28/2014 Imaging    CT chest: Large R apical mass consistent with malignancy. This is destroying the R 2nd rib with extension into adjacent soft tissue. R hilar adenopathy with R 5cm adrenal metastatic lesion.      08/01/2014 Initial Biopsy    Lung, needle/core biopsy(ies), right upper lobe - POORLY DIFFERENTIATED ADENOCARCINOMA, SEE COMMENT.      08/08/2014 PET scan    Large hypermetabolic R apical mass with evidence of direct chest wall and mediastinal invasion, right retrocrural lymphadenopathy, extensive retroperitoneal lymphadenopathy, and metastatic lesions to the adrenal glands       09/02/2014 - 11/04/2014 Chemotherapy    Cisplatin/Pemetrexed/Avastin every 21 days x 4 cycles      10/07/2014 - 10/27/2014 Radiation Therapy    Right lung apex for control of brachioplexopathy.      12/24/2014 - 02/25/2015 Chemotherapy    Alimta/Avastin every 21 days.      02/20/2015 Imaging    Increase in size of right adrenal metastasis and subjacent confluent retrocaval lymphadenopathy      02/25/2015 -  Chemotherapy    Nivolumab, zometa      05/04/2015 Imaging    CT CAP- Stable to slight decrease in the posterior right apical lesion. Stable appearance of posterior right upper rib an upper thoracic  bony lesions. Slight improvement in right upper lobe tree-in-bud opacity. No new or progressive findings in...      07/28/2015 Imaging    CT CAP- Reduced size of the right apical pleural parenchymal lesion and reduced size of the right adrenal metastatic lesion. Resolution of prior retrocrural adenopathy.  Right eccentric T1 and T2 sclerosis with sclerosis and tapering of the right second..      11/17/2015 Imaging    CT CAP- Stable soft tissue thickening in the apex of the right hemi thorax. Stable right adrenal metastasis. Nodularity along the trachea and mainstem bronchi, relatively new from 07/28/2015, favoring adherent debris.      11/18/2015 Treatment Plan Change    Zometa HELD for upcoming tooth extraction      11/24/2015 Treatment Plan Change    Zometa on hold at this time in preparation for tooth extraction in March 2017.  Zometa las given on 11/18/2015.      02/03/2016 Imaging    CT CAP- Heterogeneous right apical masslike consolidation and right adrenal metastasis are unchanged      04/06/2016 Treatment Plan Change    Zometa restarted 6 weeks out from tooth extraction (04/06/2016)      05/12/2016 Imaging    CT CAP- NED in the chest, abdomen or pelvis.  Some areas of nodularity associated with the mainstem bronchi in the left upper lobe bronchus, favored to represent adherent inspissated secretions      08/17/2016 Imaging    CT CAP- 1.  Stable CTs of the chest and abdomen. No evidence of progressive metastatic disease. 2. Probable treated tumor at the right apex, right adrenal gland and T2 vertebral body, stable. 3. Fluctuating nodularity along the walls of the trachea and mainstem bronchi, likely secretions.      12/12/2016 Imaging    Further decrease in size of treated tumor within the right apex. 2. Stable treated tumor involving the right second rib and T2 vertebra. 3. Stable right adrenal gland treated tumor. 4. Emphysema 5. Aortic atherosclerosis      01/04/2017  Imaging    MRI brain- Normal brain MRI.  No intracranial metastatic disease.      From an oncology and chemotherapy standpoint, patient doing well.  She continues to tolerate therapy without any difficulties.  Appetite is stable.  Weight is stable.  She denies any nausea or vomiting.  She denies any diarrhea.  We reviewed the side effects of immunotherapy. Nivolumab side effects:  Lung problems (pneumonitis):   Shortness of breath   Chest pain    New or worse cough  Intestinal problems (colitis):   Diarrhea or more bowel movements than usual   Stools that are black, tarry, sticky, or have bloodwork mucous   Severe stomach (abdominal) pain or tenderness  Liver problems (hepatitis):   Yellowing of skin or the sclera of eyes   Nausea or vomiting   Pain on the right side of abdomen   Dark urine   Feeling less hungry than usual   Bleeding or bruising more easily than normal  Hormone gland problems (thyroid, pituitary, adrenal glands, and pancreas):   Rapid heartbeat   Weight loss or weight gain   Increased sweating   Feeling more hungry or thirsty   Urinating more than usual   Hair loss   Feeling cold   Constipation   Voice changes (voice getting deeper)   Muscle aches   Dizziness or fainting   Headaches that will not go away, or unusual headaches  Kidney problems (nephritis and kidney failure):   Change in amount of urine or color of urine   Problems and other organs:   Rash   Change in eyesight   Severe or persistent muscle or joint pains   Severe muscle weakness  Infusion (IV) reactions:   Chills or shaking   Shortness of breath or wheezing   Itching or rash   Flushing   Dizziness   Fevers   Feeling like passing out  She denies any issues with the aforementioned.  She is on levothyroxine for hypothyroidism.  This is likely secondary to immunotherapy.  She is tolerating well without any difficulties.  Review of Systems  Constitutional: Negative.  Negative for  chills, fever and weight loss.  HENT: Negative.   Eyes: Negative.   Respiratory: Negative.  Negative for cough.   Cardiovascular: Negative.  Negative for chest pain.  Gastrointestinal: Negative.  Negative for blood in stool, constipation, diarrhea, melena, nausea and vomiting.  Genitourinary: Negative.   Musculoskeletal: Negative.   Skin: Negative.   Neurological: Negative.  Negative for weakness.  Endo/Heme/Allergies: Negative.   Psychiatric/Behavioral: Negative.     Past Medical History:  Diagnosis Date  . Adrenal mass, right ([place]) 07/28/2014  . Bone metastases ([place]) 04/05/2016  . Cancer ([place])    lung  right  . Diabetes mellitus without complication ([place])   . Hyperlipidemia   . Hypertension   . Hypothyroidism due to medication 01/30/2017  . Lung mass 07/28/2014  . Reflux  Past Surgical History:  Procedure Laterality Date  . APPENDECTOMY    . ESOPHAGOGASTRODUODENOSCOPY N/A 12/09/2014   EOF:HQRFXJ esophageal stricture/mild-to-noderate erosive gastritis. negative H.pylori  . FLEXIBLE SIGMOIDOSCOPY  2011   Dr. [name]: hyperplastic polyp  . LUNG BIOPSY Right 07/2014   CT guided  . MALONEY DILATION N/A 12/09/2014   Procedure: [name] DILATION;  Surgeon: [name], MD;  Location: [facility] ENDO SUITE;  Service: Endoscopy;  Laterality: N/A;  . PORTACATH PLACEMENT Left 09/01/2014  . SAVORY DILATION N/A 12/09/2014   Procedure: SAVORY DILATION;  Surgeon: [name], MD;  Location: [facility] ENDO SUITE;  Service: Endoscopy;  Laterality: N/A;    Family History  Problem Relation Age of Onset  . Cancer Sister     Social History   Social History  . Marital status: Married    Spouse name: N/A  . Number of children: N/A  . Years of education: N/A   Social History Main Topics  . Smoking status: Light Tobacco Smoker    Packs/day: 0.30    Types: Cigarettes  . Smokeless tobacco: Never Used  . Alcohol use No  . Drug use: No  . Sexual activity: Not Asked   Other Topics Concern  .  None   Social History Narrative  . None     PHYSICAL EXAMINATION  ECOG PERFORMANCE STATUS: 0 - Asymptomatic  Vitals:   02/08/17 0913  BP: (!) 114/93  Pulse: 92  Resp: 16  Temp: 98.6 F (37 C)    GENERAL:alert, no distress, well nourished, well developed, comfortable, cooperative, smiling and unaccompanied. SKIN: skin color, texture, turgor are normal, no rashes or significant lesions HEAD: Normocephalic, No masses, lesions, tenderness or abnormalities EYES: normal, EOMI, Conjunctiva are pink and non-injected EARS: External ears normal OROPHARYNX:lips, buccal mucosa, and tongue normal and mucous membranes are moist  NECK: supple, no adenopathy, trachea midline LYMPH:  no palpable lymphadenopathy BREAST:not examined LUNGS: clear to auscultation and percussion HEART: regular rate & rhythm, no murmurs, no gallops, S1 normal and S2 normal ABDOMEN:abdomen soft, non-tender, normal bowel sounds and no masses or organomegaly BACK: Back symmetric, no curvature. EXTREMITIES:less then 2 second capillary refill, no joint deformities, effusion, or inflammation, no edema, no skin discoloration, no clubbing, no cyanosis  NEURO: alert & oriented x 3 with fluent speech, no focal motor/sensory deficits, gait normal   LABORATORY DATA: CBC    Component Value Date/Time   WBC 9.0 02/08/2017 0748   RBC 3.92 02/08/2017 0748   HGB 11.9 (L) 02/08/2017 0748   HCT 36.0 02/08/2017 0748   PLT 286 02/08/2017 0748   MCV 91.8 02/08/2017 0748   MCH 30.4 02/08/2017 0748   MCHC 33.1 02/08/2017 0748   RDW 15.8 (H) 02/08/2017 0748   LYMPHSABS 1.9 02/08/2017 0748   MONOABS 0.8 02/08/2017 0748   EOSABS 0.2 02/08/2017 0748   BASOSABS 0.0 02/08/2017 0748      Chemistry      Component Value Date/Time   NA 134 (L) 02/08/2017 0748   K 4.2 02/08/2017 0748   CL 101 02/08/2017 0748   CO2 27 02/08/2017 0748   BUN 15 02/08/2017 0748   CREATININE 1.31 (H) 02/08/2017 0748      Component Value Date/Time     CALCIUM 10.2 02/08/2017 0748   CALCIUM 9.7 06/03/2015 1000   ALKPHOS 61 02/08/2017 0748   AST 17 02/08/2017 0748   ALT 10 (L) 02/08/2017 0748   BILITOT 0.4 02/08/2017 0748     Lab Results  Component Value Date  TSH 2.814 02/08/2017     PENDING LABS:   RADIOGRAPHIC STUDIES:  No results found.   PATHOLOGY:    ASSESSMENT AND PLAN:  Cancer of upper lobe of right lung Stage IV right adenocarcinoma pancoast tumor of the lung (EGFR and ALK negative) complicated by right brachial plexus neuralgia with disease on presentation including bilateral adrenal metastases, retroperitoneal lymph nodes.  S/P Cisplatin/Pemetrexed/Avastin x 4 cycles 09/02/2014- 11/04/2014 with XRT on 10/07/2014- 10/27/2014 to right lung apex for control of brachioplexopathy followed by Alimta/Avastin maintenance therapy from 12/24/2014- 02/25/2015.  Progression of disease noted on 02/20/2015 resulting in a change of therapy to Nivolumab on 02/25/2015.  She remains on Nivolumab at this time.  She is on Xgeva for her bone metastases.  Oncology history is updated.  Pre-treatment labs today: CBC diff, CMET, TSH.  I personally reviewed and went over laboratory results with the patient.  The results are noted within this dictation. Pre-chemotherapy labs today satisfy treatment criteria.    Xgeva today and monthly.  She is due for repeat imaging.  Orders are placed for CT CAP w contrast in 3-4 weeks.  Smoking cessation education is provided.  She does not smoke every day.  She notes that she smokes only under stress.  She is seeing Dr. Caprice Beaver, podiatry, for a right #1 toe infection.  Xrays were performed in his office.  Patient is set-up for an MRI tomorrow to further evaluate this issue.  Return in 4 weeks for follow-up with ongoing treatment every 2 weeks.  Bone metastases ([place]) Bone metastases on original staging PET scan.    Xgeva today and monthly.  Hypothyroidism due to medication Presumed  immunotherapy-induced hypothyroidism.  On levothyroxine.  TSH every 2 weeks with treatment.  Continue Levothyroxine 25 mcg daily.   ORDERS PLACED FOR THIS ENCOUNTER: Orders Placed This Encounter  Procedures  . CT Chest W Contrast  . CT Abdomen W Contrast  . CBC with Differential  . Comprehensive metabolic panel  . TSH    MEDICATIONS PRESCRIBED THIS ENCOUNTER: Meds ordered this encounter  Medications  . tobramycin-dexamethasone (TOBRADEX) ophthalmic solution    Refill:  0    THERAPY PLAN:  Continue with Nivolumab every 2 weeks and Xgeva every 4 weeks.  All questions were answered. The patient knows to call the clinic with any problems, questions or concerns. We can certainly see the patient much sooner if necessary.  Patient and plan discussed with Dr. Twana First and she is in agreement with the aforementioned.   This note is electronically signed by: Doy Mince 02/08/2017 9:37 AM

## 2017-02-09 ENCOUNTER — Ambulatory Visit (HOSPITAL_COMMUNITY)
Admission: RE | Admit: 2017-02-09 | Discharge: 2017-02-09 | Disposition: A | Discharge status: Home / Self Care | Payer: 59 | Source: Ambulatory Visit | Attending: Podiatry | Admitting: Podiatry

## 2017-02-09 DIAGNOSIS — M869 Osteomyelitis, unspecified: Secondary | ICD-10-CM

## 2017-02-09 MED ORDER — GADOBENATE DIMEGLUMINE 529 MG/ML IV SOLN
8.0000 mL | Freq: Once | INTRAVENOUS | Status: AC | PRN
  Administered 2017-02-09: 8 mL via INTRAVENOUS

## 2017-02-14 ENCOUNTER — Telehealth (HOSPITAL_COMMUNITY): Payer: Self-pay | Admitting: *Deleted

## 2017-02-14 ENCOUNTER — Telehealth (HOSPITAL_COMMUNITY): Payer: Self-pay | Admitting: Oncology

## 2017-02-14 NOTE — Telephone Encounter (Signed)
Dr. Caprice Beaver called about the patient.  Recent imaging demonstrates osteomyelitis.  He recommends partial amputation of great toe.  He plans on doing next week.  She is on immunotherapy and therefore no myelosuppression from therapy.  Her labs from last week are certainly appropriate for surgical intervention.  There is no need to hold therapy, but I think delaying her treatment by ~1 week would certainly be reasonable to allow her time to recover.  Long term holding of therapy is not recommended.  Treatment plan altered accordingly.  I will have scheduler adjust her appointments accordingly.  Doy Mince 02/14/2017 5:38 PM

## 2017-02-17 ENCOUNTER — Other Ambulatory Visit: Payer: Self-pay | Admitting: Podiatry

## 2017-02-20 NOTE — Patient Instructions (Signed)
     Your procedure is scheduled on 02/22/2017.  Report to [facility] at 6:15 A.M.  Call this number if you have problems the morning of surgery:  [phone]   Remember:  Do not eat food or drink liquids after midnight.  Take these medicines the morning of surgery with A SIP OF WATER : Snythroid, Prilosec and Oxycodone.  Please use your inhaler before leaving home and bring it with you to the hospital.   Do not wear jewelry, make-up or nail polish.  Do not wear lotions, powders, or perfumes, or deoderant.  Do not shave 48 hours prior to surgery.  Men may shave face and neck.  Do not bring valuables to the hospital.  [facility] is not responsible for any belongings or valuables.  Contacts, dentures or bridgework may not be worn into surgery.  Leave your suitcase in the car.  After surgery it may be brought to your room.  For patients admitted to the hospital, discharge time will be determined by your treatment team.  Patients discharged the day of surgery will not be allowed to drive home.   Name and phone number of your driver:   family Special instructions:  none  Please read over the following fact sheets that you were given. Care and Recovery After Surgery

## 2017-02-20 NOTE — Patient Instructions (Signed)
Melissa Sullivan  02/20/2017     '@PREFPERIOPPHARMACY'$ @   Your procedure is scheduled on 02/22/2017.  Report to [facility] at [phone] A.M.  Call this number if you have problems the morning of surgery:  [phone]   Remember:  Do not eat food or drink liquids after midnight.  Take these medicines the morning of surgery with A SIP OF WATER : Synthroid, Prilosec and Oxycodone.  Please use your inhaler before coming to the hospital and bring it with you.   Do not wear jewelry, make-up or nail polish.  Do not wear lotions, powders, or perfumes, or deoderant.  Do not shave 48 hours prior to surgery.  Men may shave face and neck.  Do not bring valuables to the hospital.  [facility] is not responsible for any belongings or valuables.  Contacts, dentures or bridgework may not be worn into surgery.  Leave your suitcase in the car.  After surgery it may be brought to your room.  For patients admitted to the hospital, discharge time will be determined by your treatment team.  Patients discharged the day of surgery will not be allowed to drive home.   Name and phone number of your driver:   family Special instructions:  n/a  Please read over the following fact sheets that you were given. Care and Recovery After Surgery    Toe Amputation Toe amputation is a surgical procedure to remove all or part of a toe. You may have this procedure if:  Tissue in your toe is dying because of poor blood supply.  You have a severe infection in your toe. Removing your toe keeps nearby tissue healthy. If the toe is infected, removing it helps to keep the infection from spreading. Tell a health care provider about:  Any allergies you have.  All medicines you are taking, including vitamins, herbs, eye drops, creams, and over-the-counter medicines.  Any problems you or family members have had with anesthetic medicines.  Any blood disorders you have.  Any surgeries you have had.  Any medical  conditions you have.  Whether you are pregnant or may be pregnant. What are the risks? Generally, this is a safe procedure. However, problems may occur, including:  Bleeding.  Buildup of blood and fluid (hematoma).  Infection.  Allergic reactions to medicines.  Tissue death in the flap of skin (flap necrosis).  Trouble with healing.  Minor changes in the way that you walk (your gait).  Feeling pain in the area that was removed (phantom pain). This is rare. What happens before the procedure?  Follow instructions from your health care provider about eating or drinking restrictions.  Ask your health care provider about:  Changing or stopping your regular medicines. This is especially important if you are taking diabetes medicines or blood thinners.  Taking medicines such as aspirin and ibuprofen. These medicines can thin your blood. Do not take these medicines before your procedure if your health care provider instructs you not to.  You may be given antibiotic medicine to help prevent infection.  Plan to have someone take you home after the procedure.  If you will be going home right after the procedure, plan to have someone with you for 24 hours. What happens during the procedure?  You will be given one or more of the following:  A medicine to help you relax (sedative).  A medicine to numb the area (local anesthetic).  A medicine to make you fall asleep (general anesthetic).  A  medicine that is injected into your spine to numb the area below and slightly above the injection site (spinal anesthetic).  A medicine that is injected into an area of your body to numb everything below the injection site (regional anesthetic).  To reduce your risk of infection:  Your health care team will wash or sanitize their hands.  Your skin will be washed with soap.  Your surgeon will mark the area of your toe for removal.  Your surgeon will make a surgical cut (incision) in your  toe.  The dead tissue and bone will be removed.  Nerves and vessels will be tied or heated with a special tool to stop bleeding.  The area will be drained and cleaned.  If only part of a toe is removed, the remaining part will be covered with a flap of skin.  The incision will be treated in one of these ways:  It will be closed with stitches (sutures).  It will be left open to heal if there is an infection.  The incision area may be packed with gauze and covered with bandages (dressings).  Tissue samples may be sent to a lab to be examined under a microscope. The procedure may vary among health care providers and hospitals. What happens after the procedure?  Your blood pressure, heart rate, breathing rate, and blood oxygen level will be monitored often until the medicines you were given have worn off.  Your foot will be raised up high (elevated) to relieve swelling.  You will be monitored for pain.  You will be given pain medicines and antibiotics.  Your health care provider or physical therapist will help you to move around as soon as possible.  Do not drive for 24 hours if you received a sedative. This information is not intended to replace advice given to you by your health care provider. Make sure you discuss any questions you have with your health care provider. Document Released: 10/19/2015 Document Revised: 07/11/2016 Document Reviewed: 08/01/2015 Elsevier Interactive Patient Education  2017 [employer].

## 2017-02-21 ENCOUNTER — Ambulatory Visit (HOSPITAL_COMMUNITY)
Admission: RE | Admit: 2017-02-21 | Discharge: 2017-02-21 | Disposition: A | Discharge status: Home / Self Care | Payer: 59 | Source: Ambulatory Visit | Attending: Podiatry | Admitting: Podiatry

## 2017-02-21 ENCOUNTER — Encounter (HOSPITAL_COMMUNITY): Payer: Self-pay

## 2017-02-21 ENCOUNTER — Encounter (HOSPITAL_COMMUNITY)
Admission: RE | Admit: 2017-02-21 | Discharge: 2017-02-21 | Disposition: A | Discharge status: Home / Self Care | Payer: 59 | Source: Ambulatory Visit | Attending: Podiatry | Admitting: Podiatry

## 2017-02-21 ENCOUNTER — Other Ambulatory Visit (HOSPITAL_COMMUNITY): Payer: Self-pay | Admitting: Podiatry

## 2017-02-21 DIAGNOSIS — M86471 Chronic osteomyelitis with draining sinus, right ankle and foot: Secondary | ICD-10-CM

## 2017-02-21 DIAGNOSIS — R9431 Abnormal electrocardiogram [ECG] [EKG]: Secondary | ICD-10-CM | POA: Insufficient documentation

## 2017-02-21 DIAGNOSIS — Z01810 Encounter for preprocedural cardiovascular examination: Secondary | ICD-10-CM | POA: Insufficient documentation

## 2017-02-21 DIAGNOSIS — Z01812 Encounter for preprocedural laboratory examination: Secondary | ICD-10-CM | POA: Diagnosis present

## 2017-02-21 HISTORY — DX: Anemia, unspecified: D64.9

## 2017-02-21 HISTORY — DX: Gastro-esophageal reflux disease without esophagitis: K21.9

## 2017-02-21 LAB — CBC WITH DIFFERENTIAL/PLATELET
BASOS ABS: 0 10*3/uL (ref 0.0–0.1)
Basophils Relative: 0 %
Eosinophils Absolute: 0.1 10*3/uL (ref 0.0–0.7)
Eosinophils Relative: 1 %
HEMATOCRIT: 37.4 % (ref 36.0–46.0)
Hemoglobin: 12.3 g/dL (ref 12.0–15.0)
LYMPHS ABS: 2.3 10*3/uL (ref 0.7–4.0)
LYMPHS PCT: 24 %
MCH: 30.1 pg (ref 26.0–34.0)
MCHC: 32.9 g/dL (ref 30.0–36.0)
MCV: 91.4 fL (ref 78.0–100.0)
MONO ABS: 0.8 10*3/uL (ref 0.1–1.0)
Monocytes Relative: 8 %
NEUTROS ABS: 6.4 10*3/uL (ref 1.7–7.7)
Neutrophils Relative %: 67 %
Platelets: 329 10*3/uL (ref 150–400)
RBC: 4.09 MIL/uL (ref 3.87–5.11)
RDW: 15.9 % — AB (ref 11.5–15.5)
WBC: 9.6 10*3/uL (ref 4.0–10.5)

## 2017-02-21 LAB — BASIC METABOLIC PANEL
ANION GAP: 6 (ref 5–15)
BUN: 14 mg/dL (ref 6–20)
CALCIUM: 10 mg/dL (ref 8.9–10.3)
CO2: 27 mmol/L (ref 22–32)
Chloride: 103 mmol/L (ref 101–111)
Creatinine, Ser: 1.26 mg/dL — ABNORMAL HIGH (ref 0.44–1.00)
GFR calc non Af Amer: 45 mL/min — ABNORMAL LOW (ref >60)
GFR, EST AFRICAN AMERICAN: 53 mL/min — AB (ref >60)
Glucose, Bld: 112 mg/dL — ABNORMAL HIGH (ref 65–99)
Potassium: 3.9 mmol/L (ref 3.5–5.1)
SODIUM: 136 mmol/L (ref 135–145)

## 2017-02-22 ENCOUNTER — Ambulatory Visit (HOSPITAL_COMMUNITY): Payer: 59 | Admitting: Anesthesiology

## 2017-02-22 ENCOUNTER — Ambulatory Visit (HOSPITAL_COMMUNITY): Payer: 59

## 2017-02-22 ENCOUNTER — Inpatient Hospital Stay (HOSPITAL_COMMUNITY): Payer: 59

## 2017-02-22 ENCOUNTER — Other Ambulatory Visit (HOSPITAL_COMMUNITY): Payer: 59

## 2017-02-22 ENCOUNTER — Encounter (HOSPITAL_COMMUNITY): Payer: Self-pay

## 2017-02-22 ENCOUNTER — Ambulatory Visit (HOSPITAL_COMMUNITY)
Admission: RE | Admit: 2017-02-22 | Discharge: 2017-02-22 | Disposition: A | Discharge status: Home / Self Care | Payer: 59 | Source: Ambulatory Visit | Attending: Podiatry | Admitting: Podiatry

## 2017-02-22 ENCOUNTER — Encounter (HOSPITAL_COMMUNITY)
Admission: RE | Disposition: A | Discharge status: Home / Self Care | Payer: Self-pay | Source: Ambulatory Visit | Attending: Podiatry

## 2017-02-22 DIAGNOSIS — C771 Secondary and unspecified malignant neoplasm of intrathoracic lymph nodes: Secondary | ICD-10-CM | POA: Diagnosis not present

## 2017-02-22 DIAGNOSIS — Z9221 Personal history of antineoplastic chemotherapy: Secondary | ICD-10-CM | POA: Insufficient documentation

## 2017-02-22 DIAGNOSIS — Z809 Family history of malignant neoplasm, unspecified: Secondary | ICD-10-CM | POA: Insufficient documentation

## 2017-02-22 DIAGNOSIS — T50905S Adverse effect of unspecified drugs, medicaments and biological substances, sequela: Secondary | ICD-10-CM | POA: Diagnosis not present

## 2017-02-22 DIAGNOSIS — M86171 Other acute osteomyelitis, right ankle and foot: Secondary | ICD-10-CM | POA: Insufficient documentation

## 2017-02-22 DIAGNOSIS — C7951 Secondary malignant neoplasm of bone: Secondary | ICD-10-CM | POA: Insufficient documentation

## 2017-02-22 DIAGNOSIS — I10 Essential (primary) hypertension: Secondary | ICD-10-CM | POA: Diagnosis not present

## 2017-02-22 DIAGNOSIS — G588 Other specified mononeuropathies: Secondary | ICD-10-CM | POA: Diagnosis not present

## 2017-02-22 DIAGNOSIS — M8610 Other acute osteomyelitis, unspecified site: Secondary | ICD-10-CM | POA: Diagnosis present

## 2017-02-22 DIAGNOSIS — E119 Type 2 diabetes mellitus without complications: Secondary | ICD-10-CM | POA: Insufficient documentation

## 2017-02-22 DIAGNOSIS — E032 Hypothyroidism due to medicaments and other exogenous substances: Secondary | ICD-10-CM | POA: Diagnosis not present

## 2017-02-22 DIAGNOSIS — C7971 Secondary malignant neoplasm of right adrenal gland: Secondary | ICD-10-CM | POA: Diagnosis not present

## 2017-02-22 DIAGNOSIS — C781 Secondary malignant neoplasm of mediastinum: Secondary | ICD-10-CM | POA: Diagnosis not present

## 2017-02-22 DIAGNOSIS — C3411 Malignant neoplasm of upper lobe, right bronchus or lung: Secondary | ICD-10-CM | POA: Insufficient documentation

## 2017-02-22 DIAGNOSIS — F17200 Nicotine dependence, unspecified, uncomplicated: Secondary | ICD-10-CM | POA: Diagnosis not present

## 2017-02-22 DIAGNOSIS — Z79899 Other long term (current) drug therapy: Secondary | ICD-10-CM | POA: Diagnosis not present

## 2017-02-22 DIAGNOSIS — Z9889 Other specified postprocedural states: Secondary | ICD-10-CM

## 2017-02-22 HISTORY — PX: AMPUTATION: SHX166

## 2017-02-22 LAB — GLUCOSE, CAPILLARY
GLUCOSE-CAPILLARY: 99 mg/dL (ref 65–99)
Glucose-Capillary: 100 mg/dL — ABNORMAL HIGH (ref 65–99)

## 2017-02-22 SURGERY — AMPUTATION DIGIT
Anesthesia: Monitor Anesthesia Care | Site: Toe | Laterality: Right

## 2017-02-22 MED ORDER — EPHEDRINE SULFATE 50 MG/ML IJ SOLN
INTRAMUSCULAR | Status: AC
  Filled 2017-02-22: qty 1

## 2017-02-22 MED ORDER — SODIUM CHLORIDE 0.9 % IJ SOLN
INTRAMUSCULAR | Status: AC
  Filled 2017-02-22: qty 10

## 2017-02-22 MED ORDER — FENTANYL CITRATE (PF) 100 MCG/2ML IJ SOLN
INTRAMUSCULAR | Status: AC
  Filled 2017-02-22: qty 2

## 2017-02-22 MED ORDER — LACTATED RINGERS IV SOLN
INTRAVENOUS | Status: DC
  Administered 2017-02-22: 07:00:00 via INTRAVENOUS

## 2017-02-22 MED ORDER — 0.9 % SODIUM CHLORIDE (POUR BTL) OPTIME
TOPICAL | Status: DC | PRN
  Administered 2017-02-22: 1000 mL

## 2017-02-22 MED ORDER — PROPOFOL 500 MG/50ML IV EMUL
INTRAVENOUS | Status: DC | PRN
  Administered 2017-02-22: 35 ug/kg/min via INTRAVENOUS

## 2017-02-22 MED ORDER — MIDAZOLAM HCL 5 MG/5ML IJ SOLN
INTRAMUSCULAR | Status: DC | PRN
  Administered 2017-02-22: 1 mg via INTRAVENOUS

## 2017-02-22 MED ORDER — LIDOCAINE HCL (PF) 1 % IJ SOLN
INTRAMUSCULAR | Status: AC
  Filled 2017-02-22: qty 30

## 2017-02-22 MED ORDER — BUPIVACAINE HCL (PF) 0.5 % IJ SOLN
INTRAMUSCULAR | Status: DC | PRN
  Administered 2017-02-22: 4 mL

## 2017-02-22 MED ORDER — FENTANYL CITRATE (PF) 100 MCG/2ML IJ SOLN: 25.0000 ug | INTRAMUSCULAR | Status: DC | PRN

## 2017-02-22 MED ORDER — BUPIVACAINE HCL (PF) 0.5 % IJ SOLN
INTRAMUSCULAR | Status: AC
  Filled 2017-02-22: qty 30

## 2017-02-22 MED ORDER — MIDAZOLAM HCL 2 MG/2ML IJ SOLN
INTRAMUSCULAR | Status: AC
  Filled 2017-02-22: qty 2

## 2017-02-22 MED ORDER — CEFAZOLIN SODIUM-DEXTROSE 2-4 GM/100ML-% IV SOLN
2.0000 g | Freq: Once | INTRAVENOUS | Status: AC
  Administered 2017-02-22: 2 g via INTRAVENOUS
  Filled 2017-02-22: qty 100

## 2017-02-22 MED ORDER — MIDAZOLAM HCL 2 MG/2ML IJ SOLN
1.0000 mg | INTRAMUSCULAR | Status: AC
  Administered 2017-02-22: 2 mg via INTRAVENOUS

## 2017-02-22 MED ORDER — FENTANYL CITRATE (PF) 100 MCG/2ML IJ SOLN
25.0000 ug | INTRAMUSCULAR | Status: AC
  Administered 2017-02-22: 25 ug via INTRAVENOUS

## 2017-02-22 MED ORDER — FENTANYL CITRATE (PF) 100 MCG/2ML IJ SOLN
INTRAMUSCULAR | Status: DC | PRN
  Administered 2017-02-22: 25 ug via INTRAVENOUS

## 2017-02-22 SURGICAL SUPPLY — 34 items
BAG HAMPER (MISCELLANEOUS) ×2 IMPLANT
BANDAGE ELASTIC 4 VELCRO NS (GAUZE/BANDAGES/DRESSINGS) ×2 IMPLANT
BANDAGE ESMARK 4X12 BL STRL LF (DISPOSABLE) ×1 IMPLANT
BLADE SURG 15 STRL LF DISP TIS (BLADE) ×1 IMPLANT
BLADE SURG 15 STRL SS (BLADE) ×2
BNDG CMPR 12X4 ELC STRL LF (DISPOSABLE) ×1
BNDG ESMARK 4X12 BLUE STRL LF (DISPOSABLE) ×2
BNDG GAUZE ELAST 4 BULKY (GAUZE/BANDAGES/DRESSINGS) ×2 IMPLANT
CLOTH BEACON ORANGE TIMEOUT ST (SAFETY) ×2 IMPLANT
COVER LIGHT HANDLE STERIS (MISCELLANEOUS) ×4 IMPLANT
CUFF TOURNIQUET SINGLE 18IN (TOURNIQUET CUFF) ×2 IMPLANT
DECANTER SPIKE VIAL GLASS SM (MISCELLANEOUS) ×2 IMPLANT
DRSG ADAPTIC 3X8 NADH LF (GAUZE/BANDAGES/DRESSINGS) ×2 IMPLANT
ELECT REM PT RETURN 9FT ADLT (ELECTROSURGICAL) ×2
ELECTRODE REM PT RTRN 9FT ADLT (ELECTROSURGICAL) ×1 IMPLANT
GAUZE SPONGE 4X4 12PLY STRL (GAUZE/BANDAGES/DRESSINGS) ×2 IMPLANT
GLOVE BIO SURGEON STRL SZ7.5 (GLOVE) ×2 IMPLANT
GLOVE BIOGEL PI IND STRL 7.0 (GLOVE) ×2 IMPLANT
GLOVE BIOGEL PI INDICATOR 7.0 (GLOVE) ×2
GOWN STRL REUS W/TWL LRG LVL3 (GOWN DISPOSABLE) ×4 IMPLANT
KIT ROOM TURNOVER APOR (KITS) ×2 IMPLANT
MANIFOLD NEPTUNE II (INSTRUMENTS) ×2 IMPLANT
NEEDLE HYPO 27GX1-1/4 (NEEDLE) ×4 IMPLANT
NS IRRIG 1000ML POUR BTL (IV SOLUTION) ×2 IMPLANT
PACK BASIC LIMB (CUSTOM PROCEDURE TRAY) ×2 IMPLANT
PAD ARMBOARD 7.5X6 YLW CONV (MISCELLANEOUS) ×2 IMPLANT
RASP SM TEAR CROSS CUT (RASP) IMPLANT
SET BASIN LINEN APH (SET/KITS/TRAYS/PACK) ×2 IMPLANT
SOL PREP PROV IODINE SCRUB 4OZ (MISCELLANEOUS) ×2 IMPLANT
SPONGE LAP 18X18 X RAY DECT (DISPOSABLE) ×2 IMPLANT
SUT ETHILON 4 0 PS 2 18 (SUTURE) IMPLANT
SUT PROLENE 4 0 PS 2 18 (SUTURE) ×4 IMPLANT
SUT VIC AB 4-0 PS2 27 (SUTURE) IMPLANT
SYR CONTROL 10ML LL (SYRINGE) ×4 IMPLANT

## 2017-02-22 NOTE — Discharge Instructions (Signed)
These instructions will give you an idea of what to expect after surgery and how to manage issues that may arise before your first post op office visit. ° °Pain Management °Pain is best managed by “staying ahead” of it. If pain gets out of control, it is difficult to get it back under control. Local anesthesia that lasts 6-8 hours is used to numb the foot and decrease pain.  For the best pain control, take the pain medication every 4 hours for the first 2 days post op. On the third day pain medication can be taken as needed.  ° °Post Op Nausea °Nausea is common after surgery, so it is managed proactively.  °If prescribed, use the prescribed nausea medication regularly for the first 2 days post op. ° °Bandages °Do not worry if there is blood on the bandage. What looks like a lot of blood on the bandage is actually a small amount. Blood on the dressing spreads out as it is absorbed by the gauze, the same way a drop of water spreads out on a paper towel.  °If the bandages feel wet or dry, stiff and uncomfortable, call the office during office hours and we will schedule a time for you to have the bandage changed.  °Unless you are specifically told otherwise, we will do the first bandage change in the office.  °Keep your bandage dry. If the bandage becomes wet or soiled, notify the office and we will schedule a time to change the bandage. ° °Activity °It is best to spend most of the first 2 days after surgery lying down with the foot elevated above the level of your heart. °You may put weight on your heel while wearing the surgical shoe.   °You may only get up to go to the restroom. ° °Driving °Do not drive until you are able to respond in an emergency (i.e. slam on the brakes). This usually occurs after the bone has healed - 6 to 8 weeks. ° °Call the Office °If you have a fever over 101°F.  °If you have increasing pain after the initial post op pain has settled down.  °If you have increasing redness, swelling, or  drainage.  °If you have any questions or concerns.  ° ° °PATIENT INSTRUCTIONS °POST-ANESTHESIA ° °IMMEDIATELY FOLLOWING SURGERY:  Do not drive or operate machinery for the first twenty four hours after surgery.  Do not make any important decisions for twenty four hours after surgery or while taking narcotic pain medications or sedatives.  If you develop intractable nausea and vomiting or a severe headache please notify your doctor immediately. ° °FOLLOW-UP:  Please make an appointment with your surgeon as instructed. You do not need to follow up with anesthesia unless specifically instructed to do so. ° °WOUND CARE INSTRUCTIONS (if applicable):  Keep a dry clean dressing on the anesthesia/puncture wound site if there is drainage.  Once the wound has quit draining you may leave it open to air.  Generally you should leave the bandage intact for twenty four hours unless there is drainage.  If the epidural site drains for more than 36-48 hours please call the anesthesia department. ° °QUESTIONS?:  Please feel free to call your physician or the hospital operator if you have any questions, and they will be happy to assist you.    ° ° ° °

## 2017-02-22 NOTE — Op Note (Signed)
OPERATIVE NOTE  DATE OF PROCEDURE 02/22/2017  SURGEON Marcheta Grammes, DPM  ASSISTANT SURGEON Jilda Panda, [place]  OR STAFF Circulator: Venda Rodes, RN Scrub Person: Romero Liner, CST   PREOPERATIVE DIAGNOSIS Osteomyelitis of distal phalanx, right great toe  POSTOPERATIVE DIAGNOSIS Same  PROCEDURE Partial amputation of right great toe  ANESTHESIA Monitor Anesthesia Care   HEMOSTASIS Pneumatic ankle tourniquet set at 250 mmHg  ESTIMATED BLOOD LOSS Minimal (<5 cc)  MATERIALS USED None  INJECTABLES 0.5% Marcaine plain  PATHOLOGY Distal aspect of the right great toe  COMPLICATIONS None  INDICATIONS:  Wound along the distal aspect of the right great toe consistent with sinus tract.  MRI findings consistent with osteomyelitis of the distal phalanx.  DESCRIPTION OF THE PROCEDURE:  The patient was brought to the operating room and placed on the operative table in the supine position.  A pneumatic ankle tourniquet was applied to the operative extremity.  Following sedation, the surgical site was anesthetized with 0.5% Marcaine plain.  The foot was then prepped, scrubbed, and draped in the usual sterile technique.  The foot was elevated, exsanguinated and the pneumatic ankle tourniquet inflated to 250 mmHg.    Attention was directed to the right great toe.  2 converging semielliptical incisions were made overlying the interphalangeal joint of the hallux.  Dissection was continued deep to the level of the interphalangeal joint.  The joint was disarticulated.  The distal aspect of the toe was removed and passed from the operative field.  The toe was sent to pathology for evaluation.  The head of the proximal phalanx was free of any erosive changes.  The surgical wound was irrigated with copious amounts of sterile irrigant.  The skin was reapproximated using 4-0 Prolene.  A sterile compressive dressing was applied to the right great toe.  The pneumatic ankle tourniquet  was deflated and a prompt hyperemic response was noted to all remaining digits of the right foot.   The patient tolerated the procedure well.  The patient was then transferred to PACU with vital signs stable and vascular status intact to all toes of the operative foot.

## 2017-02-22 NOTE — Transfer of Care (Signed)
Immediate Anesthesia Transfer of Care Note  Patient: Melissa Sullivan  Procedure(s) Performed: Procedure(s): PARTIAL AMPUTATION RIGHT GREAT TOE (Right)  Patient Location: PACU  Anesthesia Type:MAC  Level of Consciousness: awake and patient cooperative  Airway & Oxygen Therapy: Patient Spontanous Breathing and Patient connected to nasal cannula oxygen  Post-op Assessment: Report given to RN, Post -op Vital signs reviewed and stable and Patient moving all extremities  Post vital signs: Reviewed and stable  Last Vitals:  Vitals:   02/22/17 0720 02/22/17 0725  BP: 120/75 120/68  Pulse:    Resp: (!) 24 (!) 22  Temp:      Last Pain:  Vitals:   02/22/17 0639  TempSrc: Oral  PainSc: 0-No pain         Complications: No apparent anesthesia complications

## 2017-02-22 NOTE — Anesthesia Postprocedure Evaluation (Signed)
Anesthesia Post Note  Patient: Melissa Sullivan  Procedure(s) Performed: Procedure(s) (LRB): PARTIAL AMPUTATION RIGHT GREAT TOE (Right)  Patient location during evaluation: PACU Anesthesia Type: MAC Level of consciousness: awake, oriented and patient cooperative Pain management: pain level controlled Vital Signs Assessment: post-procedure vital signs reviewed and stable Respiratory status: spontaneous breathing, nonlabored ventilation and respiratory function stable Cardiovascular status: blood pressure returned to baseline Postop Assessment: no signs of nausea or vomiting Anesthetic complications: no     Last Vitals:  Vitals:   02/22/17 0720 02/22/17 0725  BP: 120/75 120/68  Pulse:    Resp: (!) 24 (!) 22  Temp:      Last Pain:  Vitals:   02/22/17 0639  TempSrc: Oral  PainSc: 0-No pain                 Jwan Hornbaker J

## 2017-02-22 NOTE — H&P (Signed)
HISTORY AND PHYSICAL INTERVAL NOTE:  02/22/2017  7:19 AM  Melissa Sullivan  has presented today for surgery, with the diagnosis of osteomyelitis of distal phalanx right toe.  The various methods of treatment have been discussed with the patient.  No guarantees were given.  After consideration of risks, benefits and other options for treatment, the patient has consented to surgery.  I have reviewed the patients' chart and labs.    Patient Vitals for the past 24 hrs:  BP Temp Temp src Pulse Resp SpO2 Height Weight  02/22/17 0700 125/76 - - - 14 98 % - -  02/22/17 0645 127/81 - - - 20 99 % - -  02/22/17 0639 127/81 97.8 F (36.6 C) Oral 85 16 98 % '4\' 11"'$  (1.499 m) 96 lb (43.5 kg)    A history and physical examination was performed in my office.  The patient was reexamined.  There have been no changes to this history and physical examination.  Marcheta Grammes, DPM

## 2017-02-22 NOTE — Anesthesia Preprocedure Evaluation (Signed)
Anesthesia Evaluation  Patient identified by MRN, date of birth, ID band Patient awake    Reviewed: Allergy & Precautions, NPO status , Patient's Chart, lab work & pertinent test results  Airway Mallampati: II  TM Distance: >3 FB     Dental  (+) Poor Dentition   Pulmonary Current Smoker,  Lung CA, s/p RT    breath sounds clear to auscultation       Cardiovascular hypertension, Pt. on medications  Rhythm:Regular Rate:Normal     Neuro/Psych    GI/Hepatic GERD  Controlled and Medicated,  Endo/Other  diabetes, Type 2Hypothyroidism   Renal/GU      Musculoskeletal   Abdominal   Peds  Hematology   Anesthesia Other Findings   Reproductive/Obstetrics                             Anesthesia Physical Anesthesia Plan  ASA: III  Anesthesia Plan: MAC   Post-op Pain Management:    Induction: Intravenous  Airway Management Planned:   Additional Equipment:   Intra-op Plan:   Post-operative Plan:   Informed Consent: I have reviewed the patients History and Physical, chart, labs and discussed the procedure including the risks, benefits and alternatives for the proposed anesthesia with the patient or authorized representative who has indicated his/her understanding and acceptance.     Plan Discussed with:   Anesthesia Plan Comments:         Anesthesia Quick Evaluation

## 2017-02-23 ENCOUNTER — Encounter (HOSPITAL_COMMUNITY): Payer: Self-pay | Admitting: Podiatry

## 2017-03-01 ENCOUNTER — Ambulatory Visit (HOSPITAL_COMMUNITY): Payer: 59

## 2017-03-08 ENCOUNTER — Encounter (HOSPITAL_COMMUNITY): Payer: 59

## 2017-03-08 ENCOUNTER — Encounter (HOSPITAL_COMMUNITY): Payer: Self-pay

## 2017-03-08 ENCOUNTER — Ambulatory Visit (HOSPITAL_COMMUNITY): Payer: 59 | Admitting: Oncology

## 2017-03-08 ENCOUNTER — Inpatient Hospital Stay (HOSPITAL_COMMUNITY): Payer: 59

## 2017-03-08 ENCOUNTER — Encounter (HOSPITAL_COMMUNITY): Payer: 59 | Attending: Oncology

## 2017-03-08 VITALS — BP 108/71 | HR 77 | Temp 98.0°F | Resp 18 | Wt 94.8 lb

## 2017-03-08 DIAGNOSIS — C7951 Secondary malignant neoplasm of bone: Secondary | ICD-10-CM | POA: Diagnosis not present

## 2017-03-08 DIAGNOSIS — Z5112 Encounter for antineoplastic immunotherapy: Secondary | ICD-10-CM

## 2017-03-08 DIAGNOSIS — E032 Hypothyroidism due to medicaments and other exogenous substances: Secondary | ICD-10-CM | POA: Diagnosis present

## 2017-03-08 DIAGNOSIS — C3411 Malignant neoplasm of upper lobe, right bronchus or lung: Secondary | ICD-10-CM

## 2017-03-08 DIAGNOSIS — E278 Other specified disorders of adrenal gland: Secondary | ICD-10-CM

## 2017-03-08 LAB — COMPREHENSIVE METABOLIC PANEL
ALK PHOS: 59 U/L (ref 38–126)
ALT: 11 U/L — ABNORMAL LOW (ref 14–54)
ANION GAP: 8 (ref 5–15)
AST: 17 U/L (ref 15–41)
Albumin: 4.1 g/dL (ref 3.5–5.0)
BILIRUBIN TOTAL: 0.5 mg/dL (ref 0.3–1.2)
BUN: 12 mg/dL (ref 6–20)
CALCIUM: 9.5 mg/dL (ref 8.9–10.3)
CO2: 25 mmol/L (ref 22–32)
Chloride: 102 mmol/L (ref 101–111)
Creatinine, Ser: 1.36 mg/dL — ABNORMAL HIGH (ref 0.44–1.00)
GFR, EST AFRICAN AMERICAN: 48 mL/min — AB (ref >60)
GFR, EST NON AFRICAN AMERICAN: 41 mL/min — AB (ref >60)
Glucose, Bld: 129 mg/dL — ABNORMAL HIGH (ref 65–99)
POTASSIUM: 4.3 mmol/L (ref 3.5–5.1)
Sodium: 135 mmol/L (ref 135–145)
TOTAL PROTEIN: 8 g/dL (ref 6.5–8.1)

## 2017-03-08 LAB — CBC WITH DIFFERENTIAL/PLATELET
Basophils Absolute: 0 10*3/uL (ref 0.0–0.1)
Basophils Relative: 0 %
Eosinophils Absolute: 0.2 10*3/uL (ref 0.0–0.7)
Eosinophils Relative: 2 %
HEMATOCRIT: 38 % (ref 36.0–46.0)
HEMOGLOBIN: 12.5 g/dL (ref 12.0–15.0)
LYMPHS ABS: 3.1 10*3/uL (ref 0.7–4.0)
Lymphocytes Relative: 33 %
MCH: 30.1 pg (ref 26.0–34.0)
MCHC: 32.9 g/dL (ref 30.0–36.0)
MCV: 91.6 fL (ref 78.0–100.0)
MONO ABS: 0.7 10*3/uL (ref 0.1–1.0)
MONOS PCT: 7 %
NEUTROS ABS: 5.5 10*3/uL (ref 1.7–7.7)
NEUTROS PCT: 58 %
Platelets: 271 10*3/uL (ref 150–400)
RBC: 4.15 MIL/uL (ref 3.87–5.11)
RDW: 15.9 % — AB (ref 11.5–15.5)
WBC: 9.5 10*3/uL (ref 4.0–10.5)

## 2017-03-08 LAB — TSH: TSH: 3.665 u[IU]/mL (ref 0.350–4.500)

## 2017-03-08 MED ORDER — SODIUM CHLORIDE 0.9 % IV SOLN
Freq: Once | INTRAVENOUS | Status: AC
  Administered 2017-03-08: 09:00:00 via INTRAVENOUS

## 2017-03-08 MED ORDER — DENOSUMAB 120 MG/1.7ML ~~LOC~~ SOLN
120.0000 mg | Freq: Once | SUBCUTANEOUS | Status: AC
  Administered 2017-03-08: 120 mg via SUBCUTANEOUS
  Filled 2017-03-08: qty 1.7

## 2017-03-08 MED ORDER — SODIUM CHLORIDE 0.9 % IV SOLN
240.0000 mg | Freq: Once | INTRAVENOUS | Status: AC
  Administered 2017-03-08: 240 mg via INTRAVENOUS
  Filled 2017-03-08: qty 24

## 2017-03-08 MED ORDER — SODIUM CHLORIDE 0.9 % IJ SOLN
10.0000 mL | INTRAMUSCULAR | Status: DC | PRN
  Administered 2017-03-08: 10 mL
  Filled 2017-03-08: qty 10

## 2017-03-08 MED ORDER — HEPARIN SOD (PORK) LOCK FLUSH 100 UNIT/ML IV SOLN
INTRAVENOUS | Status: AC
  Filled 2017-03-08: qty 5

## 2017-03-08 MED ORDER — HEPARIN SOD (PORK) LOCK FLUSH 100 UNIT/ML IV SOLN
500.0000 [IU] | Freq: Once | INTRAVENOUS | Status: AC | PRN
  Administered 2017-03-08: 500 [IU]

## 2017-03-08 NOTE — Patient Instructions (Signed)
[facility] Discharge Instructions for Patients Receiving Chemotherapy   Beginning January 23rd 2017 lab work for the Cancer Center will be done in the  Main lab at [facility] on 1st floor. If you have a lab appointment with the Cancer Center please come in thru the  Main Entrance and check in at the main information desk  Xgeva given today.   Today you received the following chemotherapy agents   To help prevent nausea and vomiting after your treatment, we encourage you to take your nausea medication     If you develop nausea and vomiting, or diarrhea that is not controlled by your medication, call the clinic.  The clinic phone number is [phone]. Office hours are Monday-Friday 8:30am-5:00pm.  BELOW ARE SYMPTOMS THAT SHOULD BE REPORTED IMMEDIATELY:  *FEVER GREATER THAN 101.0 F  *CHILLS WITH OR WITHOUT FEVER  NAUSEA AND VOMITING THAT IS NOT CONTROLLED WITH YOUR NAUSEA MEDICATION  *UNUSUAL SHORTNESS OF BREATH  *UNUSUAL BRUISING OR BLEEDING  TENDERNESS IN MOUTH AND THROAT WITH OR WITHOUT PRESENCE OF ULCERS  *URINARY PROBLEMS  *BOWEL PROBLEMS  UNUSUAL RASH Items with * indicate a potential emergency and should be followed up as soon as possible. If you have an emergency after office hours please contact your primary care physician or go to the nearest emergency department.  Please call the clinic during office hours if you have any questions or concerns.   You may also contact the Patient Navigator at [phone] should you have any questions or need assistance in obtaining follow up care.      Resources For Cancer Patients and their Caregivers ? American Cancer Society: Can assist with transportation, wigs, general needs, runs Look Good Feel Better.        1-888-227-6333 ? Cancer Care: Provides financial assistance, online support groups, medication/co-pay assistance.  1-800-813-HOPE (4673) ? [facility] Assists  [place] cancer patients and their families through emotional , educational and financial support.  336-427-4357 ? [place] DSS Where to apply for food stamps, Medicaid and utility assistance. 336-342-1394 ? [facility]: Transportation to medical appointments. 336-347-2287 ? Social Security Administration: May apply for disability if have a Stage IV cancer. 336-342-7796 1-800-772-1213 ? [place] Aging, Disability and Transit Services: Assists with nutrition, care and transit needs. 336-349-2343         

## 2017-03-08 NOTE — Progress Notes (Signed)
Labs reviewed with MD, proceed with treatment. Melissa Sullivan presents today for injection per MD orders. xgeva  120 mg administered SQ in right Abdomen. Administration without incident. Patient tolerated well. Vitals stable and discharged home from clinic ambulatory. Follow up as scheduled.

## 2017-03-15 ENCOUNTER — Ambulatory Visit (HOSPITAL_COMMUNITY)
Admission: RE | Admit: 2017-03-15 | Discharge: 2017-03-15 | Disposition: A | Discharge status: Home / Self Care | Payer: 59 | Source: Ambulatory Visit | Attending: Oncology | Admitting: Oncology

## 2017-03-15 DIAGNOSIS — J439 Emphysema, unspecified: Secondary | ICD-10-CM | POA: Insufficient documentation

## 2017-03-15 DIAGNOSIS — C3411 Malignant neoplasm of upper lobe, right bronchus or lung: Secondary | ICD-10-CM | POA: Insufficient documentation

## 2017-03-15 DIAGNOSIS — I700 Atherosclerosis of aorta: Secondary | ICD-10-CM | POA: Diagnosis not present

## 2017-03-15 DIAGNOSIS — C7971 Secondary malignant neoplasm of right adrenal gland: Secondary | ICD-10-CM | POA: Diagnosis not present

## 2017-03-15 MED ORDER — IOPAMIDOL (ISOVUE-300) INJECTION 61%
80.0000 mL | Freq: Once | INTRAVENOUS | Status: AC | PRN
  Administered 2017-03-15: 80 mL via INTRAVENOUS

## 2017-03-22 ENCOUNTER — Encounter (HOSPITAL_COMMUNITY): Payer: 59

## 2017-03-22 ENCOUNTER — Encounter (HOSPITAL_BASED_OUTPATIENT_CLINIC_OR_DEPARTMENT_OTHER): Payer: 59

## 2017-03-22 ENCOUNTER — Encounter (HOSPITAL_COMMUNITY): Payer: Self-pay | Admitting: Oncology

## 2017-03-22 ENCOUNTER — Encounter (HOSPITAL_COMMUNITY): Payer: 59 | Attending: Oncology | Admitting: Oncology

## 2017-03-22 VITALS — BP 65/54 | HR 72 | Temp 98.6°F | Resp 18

## 2017-03-22 DIAGNOSIS — E032 Hypothyroidism due to medicaments and other exogenous substances: Secondary | ICD-10-CM | POA: Diagnosis not present

## 2017-03-22 DIAGNOSIS — C7951 Secondary malignant neoplasm of bone: Secondary | ICD-10-CM

## 2017-03-22 DIAGNOSIS — I10 Essential (primary) hypertension: Secondary | ICD-10-CM | POA: Insufficient documentation

## 2017-03-22 DIAGNOSIS — F17210 Nicotine dependence, cigarettes, uncomplicated: Secondary | ICD-10-CM | POA: Insufficient documentation

## 2017-03-22 DIAGNOSIS — E785 Hyperlipidemia, unspecified: Secondary | ICD-10-CM | POA: Diagnosis not present

## 2017-03-22 DIAGNOSIS — E119 Type 2 diabetes mellitus without complications: Secondary | ICD-10-CM | POA: Insufficient documentation

## 2017-03-22 DIAGNOSIS — E039 Hypothyroidism, unspecified: Secondary | ICD-10-CM | POA: Diagnosis not present

## 2017-03-22 DIAGNOSIS — C3411 Malignant neoplasm of upper lobe, right bronchus or lung: Secondary | ICD-10-CM

## 2017-03-22 DIAGNOSIS — Z5112 Encounter for antineoplastic immunotherapy: Secondary | ICD-10-CM

## 2017-03-22 DIAGNOSIS — K219 Gastro-esophageal reflux disease without esophagitis: Secondary | ICD-10-CM | POA: Insufficient documentation

## 2017-03-22 DIAGNOSIS — E278 Other specified disorders of adrenal gland: Secondary | ICD-10-CM

## 2017-03-22 LAB — COMPREHENSIVE METABOLIC PANEL
ALK PHOS: 58 U/L (ref 38–126)
ALT: 9 U/L — AB (ref 14–54)
ANION GAP: 7 (ref 5–15)
AST: 18 U/L (ref 15–41)
Albumin: 4 g/dL (ref 3.5–5.0)
BUN: 16 mg/dL (ref 6–20)
CHLORIDE: 103 mmol/L (ref 101–111)
CO2: 25 mmol/L (ref 22–32)
Calcium: 10.1 mg/dL (ref 8.9–10.3)
Creatinine, Ser: 1.42 mg/dL — ABNORMAL HIGH (ref 0.44–1.00)
GFR calc Af Amer: 45 mL/min — ABNORMAL LOW (ref >60)
GFR calc non Af Amer: 39 mL/min — ABNORMAL LOW (ref >60)
GLUCOSE: 86 mg/dL (ref 65–99)
POTASSIUM: 4 mmol/L (ref 3.5–5.1)
SODIUM: 135 mmol/L (ref 135–145)
TOTAL PROTEIN: 7.8 g/dL (ref 6.5–8.1)
Total Bilirubin: 0.4 mg/dL (ref 0.3–1.2)

## 2017-03-22 LAB — CBC WITH DIFFERENTIAL/PLATELET
BASOS ABS: 0 10*3/uL (ref 0.0–0.1)
BASOS PCT: 0 %
EOS PCT: 1 %
Eosinophils Absolute: 0.1 10*3/uL (ref 0.0–0.7)
HCT: 37.6 % (ref 36.0–46.0)
Hemoglobin: 12.4 g/dL (ref 12.0–15.0)
LYMPHS PCT: 24 %
Lymphs Abs: 1.8 10*3/uL (ref 0.7–4.0)
MCH: 30.4 pg (ref 26.0–34.0)
MCHC: 33 g/dL (ref 30.0–36.0)
MCV: 92.2 fL (ref 78.0–100.0)
Monocytes Absolute: 0.6 10*3/uL (ref 0.1–1.0)
Monocytes Relative: 8 %
NEUTROS ABS: 4.9 10*3/uL (ref 1.7–7.7)
Neutrophils Relative %: 67 %
Platelets: 273 10*3/uL (ref 150–400)
RBC: 4.08 MIL/uL (ref 3.87–5.11)
RDW: 16 % — ABNORMAL HIGH (ref 11.5–15.5)
WBC: 7.3 10*3/uL (ref 4.0–10.5)

## 2017-03-22 LAB — TSH: TSH: 2.063 u[IU]/mL (ref 0.350–4.500)

## 2017-03-22 MED ORDER — SODIUM CHLORIDE 0.9 % IV SOLN
240.0000 mg | Freq: Once | INTRAVENOUS | Status: AC
  Administered 2017-03-22: 240 mg via INTRAVENOUS
  Filled 2017-03-22: qty 24

## 2017-03-22 MED ORDER — SODIUM CHLORIDE 0.9 % IV SOLN
Freq: Once | INTRAVENOUS | Status: AC
  Administered 2017-03-22: 09:00:00 via INTRAVENOUS

## 2017-03-22 MED ORDER — HEPARIN SOD (PORK) LOCK FLUSH 100 UNIT/ML IV SOLN
500.0000 [IU] | Freq: Once | INTRAVENOUS | Status: AC | PRN
  Administered 2017-03-22: 500 [IU]

## 2017-03-22 MED ORDER — OXYCODONE HCL 10 MG PO TABS: 10.0000 mg | ORAL_TABLET | Freq: Four times a day (QID) | ORAL | 0 refills | Status: DC | PRN

## 2017-03-22 MED ORDER — NIVOLUMAB CHEMO INJECTION 240 MG/24ML: 240.0000 mg | Freq: Once | INTRAVENOUS | Status: DC

## 2017-03-22 MED ORDER — HEPARIN SOD (PORK) LOCK FLUSH 100 UNIT/ML IV SOLN
INTRAVENOUS | Status: AC
  Filled 2017-03-22: qty 5

## 2017-03-22 MED ORDER — SODIUM CHLORIDE 0.9 % IJ SOLN
10.0000 mL | INTRAMUSCULAR | Status: DC | PRN
  Administered 2017-03-22: 10 mL
  Filled 2017-03-22: qty 10

## 2017-03-22 NOTE — Assessment & Plan Note (Signed)
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  On Levothyroxine 25 mcg daily.  Labs today: TSH Labs in 4 weeks: TSH.

## 2017-03-22 NOTE — Patient Instructions (Addendum)
[place] at [facility] Discharge Instructions  RECOMMENDATIONS MADE BY THE CONSULTANT AND ANY TEST RESULTS WILL BE SENT TO YOUR REFERRING PHYSICIAN.  You were seen today by Kirby Crigler PA-C. Treatment today. Treatment and Xgeva in 2 weeks. Pain medication refill given. Continue Levothyroxine daily. Return in 4 weeks for treatment and follow up.   Thank you for choosing [place] at [facility] to provide your oncology and hematology care.  To afford each patient quality time with our provider, please arrive at least 15 minutes before your scheduled appointment time.    If you have a lab appointment with the [facility] please come in thru the  Main Entrance and check in at the main information desk  You need to re-schedule your appointment should you arrive 10 or more minutes late.  We strive to give you quality time with our providers, and arriving late affects you and other patients whose appointments are after yours.  Also, if you no show three or more times for appointments you may be dismissed from the clinic at the providers discretion.     Again, thank you for choosing [facility].  Our hope is that these requests will decrease the amount of time that you wait before being seen by our physicians.       _____________________________________________________________  Should you have questions after your visit to [facility], please contact our office at [phone] between the hours of 8:30 a.m. and 4:30 p.m.  Voicemails left after 4:30 p.m. will not be returned until the following business day.  For prescription refill requests, have your pharmacy contact our office.       Resources For Cancer Patients and their Caregivers ? American Cancer Society: Can assist with transportation, wigs, general needs, runs Look Good Feel Better.        (820)516-3427 ? Cancer Care: Provides financial assistance,  online support groups, medication/co-pay assistance.  1-800-813-HOPE 206-064-3393) ? [facility] Assists [place] cancer patients and their families through emotional , educational and financial support.  743 450 0943 ? [place] DSS Where to apply for food stamps, Medicaid and utility assistance. (971)275-8128 ? RCATS: Transportation to medical appointments. 539-402-8256 ? Social Security Administration: May apply for disability if have a Stage IV cancer. (562)545-2781 (312)422-7643 ? [employer], Disability and Transit Services: Assists with nutrition, care and transit needs. [place] Support Programs: '@10RELATIVEDAYS'$ @ > Cancer Support Group  2nd Tuesday of the month 1pm-2pm, Journey Room  > Creative Journey  3rd Tuesday of the month 1130am-1pm, Journey Room  > Look Good Feel Better  1st Wednesday of the month 10am-12 noon, Journey Room (Call [place] to register [phone])

## 2017-03-22 NOTE — Assessment & Plan Note (Addendum)
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  Status post cisplatin/pemetrexed/Avastin 4 cycles (09/02/2014-11/04/2014) with palliative radiation therapy to right brachial plexus and right lung tumor on 10/07/2014-10/27/2014 to control brachioplexopathy followed by maintenance therapy with Alimta/Avastin from 12/24/2014-02/25/2015.  Progression of disease noted on 02/20/2015 with restaging tests resulting in a change to immunotherapy, Nivolumab, on 02/25/2015.  She remains on Nivolumab at this time.  Delton See is also administered to reduce the risk of skeletal related events due to bone metastases.  Oncology history is updated.  Pre-treatment labs today: CBC diff, CMET, TSH.  I personally reviewed and went over laboratory results with the patient.  The results are noted within this dictation.  Labs satisfy treatment parameters.  Labs in 2 weeks: CBC diff, CMET.  Labs in 4 weeks: CBC diff, CMET, TSH.  I personally reviewed and went over radiographic studies with the patient.  The results are noted within this dictation.  I personally reviewed the images in PACS.  Restaging imaging demonstrates ongoing stability of disease and remission.  Delton See is due in 2 weeks, then monthly.  Smoking cessation education is provided.  She only smokes when stressed and she reports days of no tobacco abuse.  Complete cessation is encouraged.  [place] Controlled Substance Reporting System is reviewed.  I have refilled her pain medication at the patient's request.  She is due for this refill.  She has been seeing Dr. Caprice Beaver, podiatry, for right #1 toe infection.  She was identified as having osteomyelitis.  As a result, she underwent amputation.  She has recovered from this procedure well.  During the time of  amputation and recovery, she was given a small break in therapy.  Return in 4 weeks for follow-up with ongoing treatment every 2 weeks.  Once [facility] approves moving forward with Nivolumab every 4 weeks, we will plan on switching the patient's regimen accordingly.  Suspect this will occur in June/July.

## 2017-03-22 NOTE — Assessment & Plan Note (Signed)
Bone metastases from adenocarcinoma of lung.  On Xgeva to reduce SRE.  Xgeva due in 2 weeks and monthly.  Labs with each Xgeva injection: CMET.

## 2017-03-22 NOTE — Progress Notes (Signed)
[name], MD [address] [place] [address]  Cancer of upper lobe of right lung [facility]) - Plan: Oxycodone HCl 10 MG TABS, DISCONTINUED: Oxycodone HCl 10 MG TABS, DISCONTINUED: Oxycodone HCl 10 MG TABS  Hypothyroidism due to medication  Bone metastases ([place])  CURRENT THERAPY:Nivolumab every 2 weeks.  Xgeva every 4 weeks.  INTERVAL HISTORY: BAYLOR TEEGARDEN 61 y.o. female returns for followup of Stage IV adenocarcinoma, pancoast tumor, of right lung.  EGFR and ALK negative) complicated by right brachial plexus neuralgia with disease on presentation including bilateral adrenal metastases, retroperitoneal lymph nodes, and osseous involvement.    Cancer of upper lobe of right lung ([place])   07/28/2014 Imaging    CT chest: Large R apical mass consistent with malignancy. This is destroying the R 2nd rib with extension into adjacent soft tissue. R hilar adenopathy with R 5cm adrenal metastatic lesion.      08/01/2014 Initial Biopsy    Lung, needle/core biopsy(ies), right upper lobe - POORLY DIFFERENTIATED ADENOCARCINOMA, SEE COMMENT.      08/08/2014 PET scan    Large hypermetabolic R apical mass with evidence of direct chest wall and mediastinal invasion, right retrocrural lymphadenopathy, extensive retroperitoneal lymphadenopathy, and metastatic lesions to the adrenal glands       09/02/2014 - 11/04/2014 Chemotherapy    Cisplatin/Pemetrexed/Avastin every 21 days x 4 cycles      10/07/2014 - 10/27/2014 Radiation Therapy    Right lung apex for control of brachioplexopathy.      12/24/2014 - 02/25/2015 Chemotherapy    Alimta/Avastin every 21 days.      02/20/2015 Imaging    Increase in size of right adrenal metastasis and subjacent confluent retrocaval lymphadenopathy      02/25/2015 -  Chemotherapy    Nivolumab, zometa      05/04/2015 Imaging    CT CAP- Stable to slight decrease in the posterior right apical lesion. Stable appearance of posterior right upper rib an  upper thoracic bony lesions. Slight improvement in right upper lobe tree-in-bud opacity. No new or progressive findings in...      07/28/2015 Imaging    CT CAP- Reduced size of the right apical pleural parenchymal lesion and reduced size of the right adrenal metastatic lesion. Resolution of prior retrocrural adenopathy.  Right eccentric T1 and T2 sclerosis with sclerosis and tapering of the right second..      11/17/2015 Imaging    CT CAP- Stable soft tissue thickening in the apex of the right hemi thorax. Stable right adrenal metastasis. Nodularity along the trachea and mainstem bronchi, relatively new from 07/28/2015, favoring adherent debris.      11/18/2015 Treatment Plan Change    Zometa HELD for upcoming tooth extraction      11/24/2015 Treatment Plan Change    Zometa on hold at this time in preparation for tooth extraction in March 2017.  Zometa las given on 11/18/2015.      02/03/2016 Imaging    CT CAP- Heterogeneous right apical masslike consolidation and right adrenal metastasis are unchanged      04/06/2016 Treatment Plan Change    Zometa restarted 6 weeks out from tooth extraction (04/06/2016)      05/12/2016 Imaging    CT CAP- NED in the chest, abdomen or pelvis.  Some areas of nodularity associated with the mainstem bronchi in the left upper lobe bronchus, favored to represent adherent inspissated secretions      08/17/2016 Imaging    CT CAP-  1. Stable CTs of the chest and abdomen. No evidence of progressive metastatic disease. 2. Probable treated tumor at the right apex, right adrenal gland and T2 vertebral body, stable. 3. Fluctuating nodularity along the walls of the trachea and mainstem bronchi, likely secretions.      12/12/2016 Imaging    Further decrease in size of treated tumor within the right apex. 2. Stable treated tumor involving the right second rib and T2 vertebra. 3. Stable right adrenal gland treated tumor. 4. Emphysema 5. Aortic atherosclerosis        01/04/2017 Imaging    MRI brain- Normal brain MRI.  No intracranial metastatic disease.      03/15/2017 Imaging    CT CAP- 1. No new or progressive metastatic disease in the chest or abdomen. 2. Stable treated tumor in the apical right upper lobe. Stable treated right posterior second rib and right T2 vertebral lesions. Stable treated right adrenal metastasis. 3. Aortic atherosclerosis. 4. Moderate emphysema with mild diffuse bronchial wall thickening, suggesting COPD.        HPI Elements Lung Cancer, adenocarcinoma  Location: Right lung  Quality: Pancoast tumor, adenocarcinoma  Severity: Stage IV  Duration: Dx on 08/01/2014  Context: Tobacco abuser  Timing:   Modifying Factors: Right brachial plexus involvement  Associated Signs & Symptoms: Right upper extremity neuralgia   Oncologically, she is doing well.  She is tolerating treatment well without any known toxicities at this time, except for her immunotherapy-induced hypothyroidism, currently on therapy.  Review of Systems  Constitutional: Negative.  Negative for chills, fever and weight loss.  HENT: Negative.   Eyes: Negative.   Respiratory: Negative.  Negative for cough.   Cardiovascular: Negative.  Negative for chest pain.  Gastrointestinal: Negative.  Negative for blood in stool, constipation, diarrhea, melena, nausea and vomiting.  Genitourinary: Negative.   Musculoskeletal: Negative.   Skin: Negative.   Neurological: Negative.  Negative for weakness.  Endo/Heme/Allergies: Negative.   Psychiatric/Behavioral: Negative.     Past Medical History:  Diagnosis Date  . Adrenal mass, right ([place]) 07/28/2014  . Anemia   . Bone metastases ([place]) 04/05/2016  . Cancer ([place])    lung  right  . Diabetes mellitus without complication ([place])   . GERD (gastroesophageal reflux disease)   . Hyperlipidemia   . Hypertension   . Hypothyroidism due to medication 01/30/2017  . Lung mass 07/28/2014  . Reflux     Past Surgical  History:  Procedure Laterality Date  . AMPUTATION Right 02/22/2017   Procedure: PARTIAL AMPUTATION RIGHT GREAT TOE;  Surgeon: Caprice Beaver, DPM;  Location: [facility] ORS;  Service: Podiatry;  Laterality: Right;  . APPENDECTOMY    . ESOPHAGOGASTRODUODENOSCOPY N/A 12/09/2014   NLZ:JQBHAL esophageal stricture/mild-to-noderate erosive gastritis. negative H.pylori  . FLEXIBLE SIGMOIDOSCOPY  2011   Dr. [name]: hyperplastic polyp  . LUNG BIOPSY Right 07/2014   CT guided  . MALONEY DILATION N/A 12/09/2014   Procedure: [name] DILATION;  Surgeon: [name], MD;  Location: [facility] ENDO SUITE;  Service: Endoscopy;  Laterality: N/A;  . PORTACATH PLACEMENT Left 09/01/2014  . SAVORY DILATION N/A 12/09/2014   Procedure: SAVORY DILATION;  Surgeon: [name], MD;  Location: [facility] END[facility] SUITE;  Service: Endoscopy;  Laterality: N/A;    Family History  Problem Relation Age of Onset  . Cancer Sister     Social History   Social History  . Marital status: Married    Spouse name: N/A  . Number of children: N/A  . Years of  education: N/A   Social History Main Topics  . Smoking status: Light Tobacco Smoker    Packs/day: 0.30    Years: 20.00    Types: Cigarettes  . Smokeless tobacco: Never Used  . Alcohol use No  . Drug use: No  . Sexual activity: Not Asked   Other Topics Concern  . None   Social History Narrative  . None     PHYSICAL EXAMINATION  ECOG PERFORMANCE STATUS: 0 - Asymptomatic  Vitals:   03/22/17 0907  BP: (!) 104/57  Pulse: 77  Resp: 16  Temp: 98.3 F (36.8 C)    GENERAL:alert, no distress, well nourished, well developed, cachectic, comfortable, cooperative, smiling and accompanied by daughter, in chemo-bed. SKIN: skin color, texture, turgor are normal, no rashes or significant lesions HEAD: Normocephalic, No masses, lesions, tenderness or abnormalities EYES: normal, EOMI, Conjunctiva are pink and non-injected EARS: External ears normal OROPHARYNX:lips, buccal mucosa, and  tongue normal and mucous membranes are moist  NECK: supple, no adenopathy, trachea midline LYMPH:  no palpable lymphadenopathy BREAST:not examined LUNGS: clear to auscultation and percussion HEART: regular rate & rhythm, no murmurs, no gallops, S1 normal and S2 normal ABDOMEN:abdomen soft, non-tender and normal bowel sounds BACK: Back symmetric, no curvature. EXTREMITIES:less then 2 second capillary refill, no joint deformities, effusion, or inflammation, no edema, no skin discoloration, no clubbing, no cyanosis, positive findings:  Right #1 partial toe amputation at DIP, well healed without any indication for infection or poor healing.  NEURO: alert & oriented x 3 with fluent speech, no focal motor/sensory deficits, gait normal   LABORATORY DATA: CBC    Component Value Date/Time   WBC 7.3 03/22/2017 0827   RBC 4.08 03/22/2017 0827   HGB 12.4 03/22/2017 0827   HCT 37.6 03/22/2017 0827   PLT 273 03/22/2017 0827   MCV 92.2 03/22/2017 0827   MCH 30.4 03/22/2017 0827   MCHC 33.0 03/22/2017 0827   RDW 16.0 (H) 03/22/2017 0827   LYMPHSABS 1.8 03/22/2017 0827   MONOABS 0.6 03/22/2017 0827   EOSABS 0.1 03/22/2017 0827   BASOSABS 0.0 03/22/2017 0827      Chemistry      Component Value Date/Time   NA 135 03/22/2017 0827   K 4.0 03/22/2017 0827   CL 103 03/22/2017 0827   CO2 25 03/22/2017 0827   BUN 16 03/22/2017 0827   CREATININE 1.42 (H) 03/22/2017 0827      Component Value Date/Time   CALCIUM 10.1 03/22/2017 0827   CALCIUM 9.7 06/03/2015 1000   ALKPHOS 58 03/22/2017 0827   AST 18 03/22/2017 0827   ALT 9 (L) 03/22/2017 0827   BILITOT 0.4 03/22/2017 0827     Lab Results  Component Value Date   TSH 3.665 03/08/2017     PENDING LABS:   RADIOGRAPHIC STUDIES:  Ct Chest W Contrast  Result Date: 03/15/2017 CLINICAL DATA:  Stage IV right upper lobe Pancoast adenocarcinoma diagnosed September 2015 status post chemotherapy, radiation therapy and ongoing immunotherapy,  presenting for restaging. EXAM: CT CHEST, ABDOMEN, AND PELVIS WITH CONTRAST TECHNIQUE: Multidetector CT imaging of the chest, abdomen and pelvis was performed following the standard protocol during bolus administration of intravenous contrast. CONTRAST:  61m ISOVUE-300 IOPAMIDOL (ISOVUE-300) INJECTION 61% COMPARISON:  12/12/2016 CT chest, abdomen and pelvis. FINDINGS: CT CHEST FINDINGS Cardiovascular: Normal heart size. No significant pericardial fluid/thickening. Left internal jugular MediPort terminates in the middle third of the superior vena cava. Atherosclerotic nonaneurysmal thoracic aorta. Normal caliber pulmonary arteries. No central pulmonary emboli. Mediastinum/Nodes:  No discrete thyroid nodules. Unremarkable esophagus. No pathologically enlarged axillary, mediastinal or hilar lymph nodes. Lungs/Pleura: No pneumothorax. No pleural effusion. Moderate centrilobular and paraseptal emphysema with mild diffuse bronchial wall thickening. Coarsely calcified pleural-based 3.4 x 2.8 cm medial apical right upper lobe lung mass (series 6/ image 18) is stable. Stable foci of mild bronchiectasis in the peripheral dependent right upper lobe (series 6/ image 52) and medial left upper lobe (series 6/image 72 and 78) with associated mucoid impaction. No acute consolidative airspace disease or significant pulmonary nodules. Musculoskeletal: Stable sclerotic mildly expansile posterior right second rib lesion. Stable sclerotic right L2 vertebral body lesion. No new focal thoracic osseous lesion. Mild thoracic spondylosis. CT ABDOMEN PELVIS FINDINGS Hepatobiliary: Normal liver with no liver mass. Normal gallbladder with no radiopaque cholelithiasis. No biliary ductal dilatation. Pancreas: Normal, with no mass or duct dilation. Spleen: Normal size. No mass. Adrenals/Urinary Tract: Stable partially calcified 2.7 x 1.4 cm right adrenal mass (series 2/ image 52). No discrete left adrenal nodule. Normal kidneys with no  hydronephrosis and no renal mass. Stomach/Bowel: Grossly normal stomach. Visualized small and large bowel is normal caliber, with no bowel wall thickening. Vascular/Lymphatic: Atherosclerotic nonaneurysmal abdominal aorta. Patent portal, splenic, hepatic and renal veins. No pathologically enlarged lymph nodes in the abdomen. Other: No pneumoperitoneum, ascites or focal fluid collection. Musculoskeletal: No aggressive appearing focal osseous lesions. Minimal lumbar spondylosis. IMPRESSION: 1. No new or progressive metastatic disease in the chest or abdomen. 2. Stable treated tumor in the apical right upper lobe. Stable treated right posterior second rib and right T2 vertebral lesions. Stable treated right adrenal metastasis. 3. Aortic atherosclerosis. 4. Moderate emphysema with mild diffuse bronchial wall thickening, suggesting COPD. Electronically Signed   By: Ilona Sorrel M.D.   On: 03/15/2017 10:21   Ct Abdomen W Contrast  Result Date: 03/15/2017 CLINICAL DATA:  Stage IV right upper lobe Pancoast adenocarcinoma diagnosed September 2015 status post chemotherapy, radiation therapy and ongoing immunotherapy, presenting for restaging. EXAM: CT CHEST, ABDOMEN, AND PELVIS WITH CONTRAST TECHNIQUE: Multidetector CT imaging of the chest, abdomen and pelvis was performed following the standard protocol during bolus administration of intravenous contrast. CONTRAST:  59m ISOVUE-300 IOPAMIDOL (ISOVUE-300) INJECTION 61% COMPARISON:  12/12/2016 CT chest, abdomen and pelvis. FINDINGS: CT CHEST FINDINGS Cardiovascular: Normal heart size. No significant pericardial fluid/thickening. Left internal jugular MediPort terminates in the middle third of the superior vena cava. Atherosclerotic nonaneurysmal thoracic aorta. Normal caliber pulmonary arteries. No central pulmonary emboli. Mediastinum/Nodes: No discrete thyroid nodules. Unremarkable esophagus. No pathologically enlarged axillary, mediastinal or hilar lymph nodes.  Lungs/Pleura: No pneumothorax. No pleural effusion. Moderate centrilobular and paraseptal emphysema with mild diffuse bronchial wall thickening. Coarsely calcified pleural-based 3.4 x 2.8 cm medial apical right upper lobe lung mass (series 6/ image 18) is stable. Stable foci of mild bronchiectasis in the peripheral dependent right upper lobe (series 6/ image 52) and medial left upper lobe (series 6/image 72 and 78) with associated mucoid impaction. No acute consolidative airspace disease or significant pulmonary nodules. Musculoskeletal: Stable sclerotic mildly expansile posterior right second rib lesion. Stable sclerotic right L2 vertebral body lesion. No new focal thoracic osseous lesion. Mild thoracic spondylosis. CT ABDOMEN PELVIS FINDINGS Hepatobiliary: Normal liver with no liver mass. Normal gallbladder with no radiopaque cholelithiasis. No biliary ductal dilatation. Pancreas: Normal, with no mass or duct dilation. Spleen: Normal size. No mass. Adrenals/Urinary Tract: Stable partially calcified 2.7 x 1.4 cm right adrenal mass (series 2/ image 52). No discrete left adrenal nodule. Normal kidneys with no  hydronephrosis and no renal mass. Stomach/Bowel: Grossly normal stomach. Visualized small and large bowel is normal caliber, with no bowel wall thickening. Vascular/Lymphatic: Atherosclerotic nonaneurysmal abdominal aorta. Patent portal, splenic, hepatic and renal veins. No pathologically enlarged lymph nodes in the abdomen. Other: No pneumoperitoneum, ascites or focal fluid collection. Musculoskeletal: No aggressive appearing focal osseous lesions. Minimal lumbar spondylosis. IMPRESSION: 1. No new or progressive metastatic disease in the chest or abdomen. 2. Stable treated tumor in the apical right upper lobe. Stable treated right posterior second rib and right T2 vertebral lesions. Stable treated right adrenal metastasis. 3. Aortic atherosclerosis. 4. Moderate emphysema with mild diffuse bronchial wall  thickening, suggesting COPD. Electronically Signed   By: Ilona Sorrel M.D.   On: 03/15/2017 10:21   Dg Foot Complete Right  Result Date: 02/22/2017 CLINICAL DATA:  Postoperative amputation first distal phalanx EXAM: RIGHT FOOT COMPLETE - 3+ VIEW COMPARISON:  February 21, 2017 FINDINGS: Frontal, oblique, and lateral views were obtained. The patient has undergone amputation of the first distal phalanx with soft tissue defect in the postoperative area. There is currently no fracture or dislocation. No erosive change or bony destruction. The joint spaces appear unremarkable. No erosive change. IMPRESSION: Status post amputation of the first distal phalanx with expected soft tissue defect overlying bandage in the area of surgery. No radiopaque foreign body beyond overlying bandage. No fracture or dislocation. No appreciable arthropathic change. No erosive change or bony destruction. Electronically Signed   By: Lowella Grip III M.D.   On: 02/22/2017 09:08   Dg Foot Complete Right  Result Date: 02/21/2017 CLINICAL DATA:  Preoperative examination for patient with chronic osteomyelitis of the right great toe. EXAM: RIGHT FOOT COMPLETE - 3+ VIEW COMPARISON:  None. FINDINGS: The soft tissues of the right great toe are swollen. Sclerosis and bony destructive change are seen in the distal phalanx consistent osteomyelitis as seen on the comparison MRI. The examination is otherwise negative. IMPRESSION: Findings consistent with osteomyelitis in the distal phalanx of the great toe as seen on prior MRI. Electronically Signed   By: Inge Rise M.D.   On: 02/21/2017 13:13     PATHOLOGY:    ASSESSMENT AND PLAN:  Cancer of upper lobe of right lung Stage IV adenocarcinoma of right lung, Pancoast tumor (EGFR and ALK negative) complicated by right brachial plexus neuralgia due to disease on presentation with metastatic disease to bilateral adrenal glands, retroperitoneal lymph nodes, and osseous involvement.  Status  post cisplatin/pemetrexed/Avastin 4 cycles (09/02/2014-11/04/2014) with palliative radiation therapy to right brachial plexus and right lung tumor on 10/07/2014-10/27/2014 to control brachioplexopathy followed by maintenance therapy with Alimta/Avastin from 12/24/2014-02/25/2015.  Progression of disease noted on 02/20/2015 with restaging tests resulting in a change to immunotherapy, Nivolumab, on 02/25/2015.  She remains on Nivolumab at this time.  Delton See is also administered to reduce the risk of skeletal related events due to bone metastases.  Oncology history is updated.  Pre-treatment labs today: CBC diff, CMET, TSH.  I personally reviewed and went over laboratory results with the patient.  The results are noted within this dictation.  Labs satisfy treatment parameters.  Labs in 2 weeks: CBC diff, CMET.  Labs in 4 weeks: CBC diff, CMET, TSH.  I personally reviewed and went over radiographic studies with the patient.  The results are noted within this dictation.  I personally reviewed the images in PACS.  Restaging imaging demonstrates ongoing stability of disease and remission.  Delton See is due in 2 weeks, then monthly.  Smoking  cessation education is provided.  She only smokes when stressed and she reports days of no tobacco abuse.  Complete cessation is encouraged.  [place] Controlled Substance Reporting System is reviewed.  I have refilled her pain medication at the patient's request.  She is due for this refill.  She has been seeing Dr. Caprice Beaver, podiatry, for right #1 toe infection.  She was identified as having osteomyelitis.  As a result, she underwent amputation.  She has recovered from this procedure well.  During the time of amputation and recovery, she was given a small break in therapy.  Return in 4 weeks for follow-up with ongoing treatment every 2 weeks.  Once [facility] approves moving forward with Nivolumab every 4 weeks, we will plan on switching the patient's  regimen accordingly.  Suspect this will occur in June/July.  Hypothyroidism due to medication Immunotherapy-induced (specifically Nivolumab) hypothyroidism.  On Levothyroxine 25 mcg daily.  Labs today: TSH Labs in 4 weeks: TSH.  Bone metastases ([place]) Bone metastases from adenocarcinoma of lung.  On Xgeva to reduce SRE.  Xgeva due in 2 weeks and monthly.  Labs with each Xgeva injection: CMET.   Number of Diagnoses or Treatment Options- Section A:      Problems to Exam Physician Problem(s) Number x Points= Results  Self-limited or minor (stable, improved, or worsening)  Max=_0 Est. Problem (to examiner); stable, improved   1 2  Est. Problem (to examiner); worsening   2   New problem (to examiner); no additional work-up planned  Max=_1 New problem (to examiner); add. work-up planned   4      Total: 6    Amount and/or Complexity of Data to be Reviewed- Section B    Data to be reviewed: Points    Review and/or order of clinical lab tests 1  _2    Review and/or order of tests in the radiology section of CPT (includes nuclear med & other except cardiac cath & ECG) 1  _3    Review and/or order of tests in the medicine section of CPT (e.g. EKG, cardiac cath, non-invasive vascular studies, pulmonary function studies) 1  _4    Discussion of test results with performing physician 1  _5    Decision to obtain old records and/or obtaining history from someone other than patient 1  _6    Review and summarization of old records and/or obtaining history from someone other than patient and/or discussion of case with another health care provider 2  _7    Independent visualization of image, tracing, or specimen itself (not simply review report) 2  _8    Total:  5     Risk of  complications and/or Morbidity or Mortality- Section C  Level of Risk: Presenting Problem(s) Diagnostic Procedure(s) Ordered Management Options Selected  Minimal One self-limited or minor problem (eg cold, insect  bite, tinea corporis)  Lab test requiring venipuncture  Chest xray  EKG/EEG  [place] or Echo  KOH prep  Urinalysis  Rest  Gargles  Elastic bandages  Superficial dressings  Low  Two or more self-limited or minor problems  One stable chronic illness (well-controlled HTN, non-insulin dependent diabetes, cataract, BPH)  Acute uncomplicated illness or injury (cystitis, allergic rhinitis, simple sprain)  Physiologic test not under stress (pulm fnx tests)  Non-cardiovascular imaging studies with contrast (barium enema)  Superficial needle biopsy  Clinical laboratory tests requiring arterial puncture  Skin biopsies  OTC drugs  Minor surgery with no identified risk factors  Physical therapy  Occupational therapy  IV fluids without additives  Moderate  One or more chronic illnesses with mild exacerbation, progression, or side effects of treatment  Two or more stable chronic illnesses  Undiagnosed new problem with uncertain prognosis (lump in breast)  Acute illness with systemic symptoms (pyelonephritis, pneumonitis, colitis)  Acute complicated injury (head injury with brief loss of consciousness)  Physiologic test under stress (cardiac stress test, fetal contraction stress test)  Diagnostic endoscopies with no identified risk factors  Deep needle or incisional biopsy  Cardiovascular imaging studies with contrast and no identified risk factors (arteriogram, cardiac cath)  Obtain fluid from body cavity (LP, thoracentesis, culdocentesis)  Minor surgery with identified risk factors  Elective surgery (open, percutaneous, or endoscopic) with no identified risk factors  Prescription drug management  Therapeutic nuclear medicine  IV fluids with additives  Closed treatment of fracture of dislocation without manipulation  High  One or more chronic illnesses with severe exacerbation, progression, or side effects of treatment.  Acute or chronic illnesses or injuries  that may pose a threat to life or bodily function (multiple trauma, acute MI, PE, severe respiratory distress, progressive severe rheumatoid arthritis, psychiatric illness with potential threat to self or others, peritonitis, acute renal failure)  An abrupt change in neurological status (seizure, TIA, weakness, sensory loss)  Cardiovascular imaging studies with contrast with identified risk factors  Cardiac electrophysiological tests  Diagnostic endoscopies with identified risk factors  Discography   Elective major surgery (open, percutaneous, or endoscopic) with identified risk factor  Emergency major surgery (open, percutaneous, or endoscopic)  Parental controlled substances  Drug therapy requiring intensive monitoring for toxicity  Decision not to resuscitate or deescalate care because of poor prognosis     Final Result of Complexity      Choose decision making level with 2 or 3 checks OR choose the decision making level on Section B       A Number of diagnoses or treatment options  _0   </= 1 Minimal  _1   2 Limited  _2   3 Multiple  _3   >/= 4 Extensive  B Amount and complexity of data  _4   </= 1 Minimal or low  _5   2 Limited  _6   3  Moderate  _7   >/= 4 Extensive  C Highest risk  _8   Minimal  _9   Low  _10   Moderate  _11   High   Type of decision making  _12   Straight-forward  _13   Low Complexity  _14   Moderate- Complexity  _15   High- Complexity     ORDERS PLACED FOR THIS ENCOUNTER: No orders of the defined types were placed in this encounter.   MEDICATIONS PRESCRIBED THIS ENCOUNTER: Meds ordered this encounter  Medications  . DISCONTD: Oxycodone HCl 10 MG TABS    Sig: Take 1-2 tablets (10-20 mg total) by mouth every 6 (six) hours as needed (pain.).    Dispense:  100 tablet    Refill:  0    Order Specific Question:   Supervising Provider    Answer:   Brunetta Genera [[id number]]  . DISCONTD: Oxycodone HCl 10 MG TABS    Sig: Take 1-2 tablets (10-20  mg total) by mouth every 6 (six) hours as needed (pain.).    Dispense:  100 tablet    Refill:  0    Order Specific Question:   Supervising Provider    Answer:   Brunetta Genera [[id number]]  . Oxycodone HCl 10 MG TABS  Sig: Take 1-2 tablets (10-20 mg total) by mouth every 6 (six) hours as needed (pain.).    Dispense:  100 tablet    Refill:  0    Order Specific Question:   Supervising Provider    Answer:   Brunetta Genera [[id number]]    THERAPY PLAN:  Continue with Nivolumab every 2 weeks and Xgeva every 4 weeks.  Will transition to Nivolumab every 4 weeks once approved.  All questions were answered. The patient knows to call the clinic with any problems, questions or concerns. We can certainly see the patient much sooner if necessary.  Patient and plan discussed with Dr. Twana First and she is in agreement with the aforementioned.   This note is electronically signed by: Doy Mince 03/22/2017 9:46 AM

## 2017-03-22 NOTE — Patient Instructions (Signed)
[facility] Discharge Instructions for Patients Receiving Chemotherapy   Beginning January 23rd 2017 lab work for the Cancer Center will be done in the  Main lab at [facility] on 1st floor. If you have a lab appointment with the Cancer Center please come in thru the  Main Entrance and check in at the main information desk   Today you received the following chemotherapy agents   To help prevent nausea and vomiting after your treatment, we encourage you to take your nausea medication     If you develop nausea and vomiting, or diarrhea that is not controlled by your medication, call the clinic.  The clinic phone number is [phone]. Office hours are Monday-Friday 8:30am-5:00pm.  BELOW ARE SYMPTOMS THAT SHOULD BE REPORTED IMMEDIATELY:  *FEVER GREATER THAN 101.0 F  *CHILLS WITH OR WITHOUT FEVER  NAUSEA AND VOMITING THAT IS NOT CONTROLLED WITH YOUR NAUSEA MEDICATION  *UNUSUAL SHORTNESS OF BREATH  *UNUSUAL BRUISING OR BLEEDING  TENDERNESS IN MOUTH AND THROAT WITH OR WITHOUT PRESENCE OF ULCERS  *URINARY PROBLEMS  *BOWEL PROBLEMS  UNUSUAL RASH Items with * indicate a potential emergency and should be followed up as soon as possible. If you have an emergency after office hours please contact your primary care physician or go to the nearest emergency department.  Please call the clinic during office hours if you have any questions or concerns.   You may also contact the Patient Navigator at [phone] should you have any questions or need assistance in obtaining follow up care.      Resources For Cancer Patients and their Caregivers ? American Cancer Society: Can assist with transportation, wigs, general needs, runs Look Good Feel Better.        1-888-227-6333 ? Cancer Care: Provides financial assistance, online support groups, medication/co-pay assistance.  1-800-813-HOPE (4673) ? [facility] Assists [place] cancer  patients and their families through emotional , educational and financial support.  336-427-4357 ? [place] DSS Where to apply for food stamps, Medicaid and utility assistance. 336-342-1394 ? RCATS: Transportation to medical appointments. 336-347-2287 ? Social Security Administration: May apply for disability if have a Stage IV cancer. 336-342-7796 1-800-772-1213 ? [place] Aging, Disability and Transit Services: Assists with nutrition, care and transit needs. [phone]         

## 2017-03-22 NOTE — Progress Notes (Signed)
Opdivo given today per orders, patient tolerated it well, no issues. Vitals stable. Follow up as scheduled.

## 2017-03-26 ENCOUNTER — Other Ambulatory Visit (HOSPITAL_COMMUNITY): Payer: Self-pay | Admitting: Oncology

## 2017-03-26 DIAGNOSIS — E032 Hypothyroidism due to medicaments and other exogenous substances: Secondary | ICD-10-CM

## 2017-04-05 ENCOUNTER — Encounter (HOSPITAL_COMMUNITY): Payer: 59

## 2017-04-05 ENCOUNTER — Other Ambulatory Visit (HOSPITAL_COMMUNITY): Payer: 59

## 2017-04-05 ENCOUNTER — Encounter (HOSPITAL_BASED_OUTPATIENT_CLINIC_OR_DEPARTMENT_OTHER): Payer: 59

## 2017-04-05 ENCOUNTER — Encounter (HOSPITAL_COMMUNITY): Payer: Self-pay

## 2017-04-05 VITALS — BP 98/62 | HR 76 | Temp 98.6°F | Resp 16 | Wt 96.0 lb

## 2017-04-05 DIAGNOSIS — C3411 Malignant neoplasm of upper lobe, right bronchus or lung: Secondary | ICD-10-CM

## 2017-04-05 DIAGNOSIS — E032 Hypothyroidism due to medicaments and other exogenous substances: Secondary | ICD-10-CM

## 2017-04-05 DIAGNOSIS — Z5111 Encounter for antineoplastic chemotherapy: Secondary | ICD-10-CM

## 2017-04-05 DIAGNOSIS — C7951 Secondary malignant neoplasm of bone: Secondary | ICD-10-CM

## 2017-04-05 DIAGNOSIS — E278 Other specified disorders of adrenal gland: Secondary | ICD-10-CM

## 2017-04-05 LAB — CBC WITH DIFFERENTIAL/PLATELET
Basophils Absolute: 0 10*3/uL (ref 0.0–0.1)
Basophils Relative: 0 %
Eosinophils Absolute: 0.1 10*3/uL (ref 0.0–0.7)
Eosinophils Relative: 2 %
HCT: 36.7 % (ref 36.0–46.0)
HEMOGLOBIN: 11.8 g/dL — AB (ref 12.0–15.0)
LYMPHS ABS: 1.7 10*3/uL (ref 0.7–4.0)
Lymphocytes Relative: 20 %
MCH: 29.8 pg (ref 26.0–34.0)
MCHC: 32.2 g/dL (ref 30.0–36.0)
MCV: 92.7 fL (ref 78.0–100.0)
MONOS PCT: 9 %
Monocytes Absolute: 0.7 10*3/uL (ref 0.1–1.0)
NEUTROS ABS: 5.7 10*3/uL (ref 1.7–7.7)
NEUTROS PCT: 69 %
Platelets: 266 10*3/uL (ref 150–400)
RBC: 3.96 MIL/uL (ref 3.87–5.11)
RDW: 16 % — ABNORMAL HIGH (ref 11.5–15.5)
WBC: 8.3 10*3/uL (ref 4.0–10.5)

## 2017-04-05 LAB — COMPREHENSIVE METABOLIC PANEL
ALK PHOS: 65 U/L (ref 38–126)
ALT: 12 U/L — ABNORMAL LOW (ref 14–54)
ANION GAP: 6 (ref 5–15)
AST: 16 U/L (ref 15–41)
Albumin: 3.8 g/dL (ref 3.5–5.0)
BILIRUBIN TOTAL: 0.3 mg/dL (ref 0.3–1.2)
BUN: 15 mg/dL (ref 6–20)
CALCIUM: 9.7 mg/dL (ref 8.9–10.3)
CO2: 26 mmol/L (ref 22–32)
Chloride: 105 mmol/L (ref 101–111)
Creatinine, Ser: 1.36 mg/dL — ABNORMAL HIGH (ref 0.44–1.00)
GFR calc Af Amer: 48 mL/min — ABNORMAL LOW (ref >60)
GFR calc non Af Amer: 41 mL/min — ABNORMAL LOW (ref >60)
GLUCOSE: 83 mg/dL (ref 65–99)
Potassium: 3.9 mmol/L (ref 3.5–5.1)
Sodium: 137 mmol/L (ref 135–145)
TOTAL PROTEIN: 7.2 g/dL (ref 6.5–8.1)

## 2017-04-05 LAB — TSH: TSH: 1.817 u[IU]/mL (ref 0.350–4.500)

## 2017-04-05 MED ORDER — NIVOLUMAB CHEMO INJECTION 240 MG/24ML
240.0000 mg | Freq: Once | INTRAVENOUS | Status: AC
  Administered 2017-04-05: 240 mg via INTRAVENOUS
  Filled 2017-04-05: qty 24

## 2017-04-05 MED ORDER — SODIUM CHLORIDE 0.9 % IJ SOLN
10.0000 mL | INTRAMUSCULAR | Status: DC | PRN
  Administered 2017-04-05: 10 mL
  Filled 2017-04-05: qty 10

## 2017-04-05 MED ORDER — SODIUM CHLORIDE 0.9 % IV SOLN
Freq: Once | INTRAVENOUS | Status: AC
  Administered 2017-04-05: 11:00:00 via INTRAVENOUS

## 2017-04-05 MED ORDER — HEPARIN SOD (PORK) LOCK FLUSH 100 UNIT/ML IV SOLN
500.0000 [IU] | Freq: Once | INTRAVENOUS | Status: AC | PRN
  Administered 2017-04-05: 500 [IU]
  Filled 2017-04-05: qty 5

## 2017-04-05 MED ORDER — DENOSUMAB 120 MG/1.7ML ~~LOC~~ SOLN
120.0000 mg | Freq: Once | SUBCUTANEOUS | Status: AC
  Administered 2017-04-05: 120 mg via SUBCUTANEOUS
  Filled 2017-04-05: qty 1.7

## 2017-04-05 NOTE — Progress Notes (Signed)
opdivo given today per orders. Patient tolerated it well without problems.   Melissa Sullivan presents today for injection per MD orders. Xgeva '120mg'$   administered SQ in right Abdomen. Administration without incident. Patient tolerated well.  Vitals stable and discharged home from clinic ambulatory. Follow up as scheduled.

## 2017-04-05 NOTE — Patient Instructions (Signed)
[facility] Discharge Instructions for Patients Receiving Chemotherapy   Beginning January 23rd 2017 lab work for the Cancer Center will be done in the  Main lab at [facility] on 1st floor. If you have a lab appointment with the Cancer Center please come in thru the  Main Entrance and check in at the main information desk   Today you received the following chemotherapy agents   To help prevent nausea and vomiting after your treatment, we encourage you to take your nausea medication     If you develop nausea and vomiting, or diarrhea that is not controlled by your medication, call the clinic.  The clinic phone number is [phone]. Office hours are Monday-Friday 8:30am-5:00pm.  BELOW ARE SYMPTOMS THAT SHOULD BE REPORTED IMMEDIATELY:  *FEVER GREATER THAN 101.0 F  *CHILLS WITH OR WITHOUT FEVER  NAUSEA AND VOMITING THAT IS NOT CONTROLLED WITH YOUR NAUSEA MEDICATION  *UNUSUAL SHORTNESS OF BREATH  *UNUSUAL BRUISING OR BLEEDING  TENDERNESS IN MOUTH AND THROAT WITH OR WITHOUT PRESENCE OF ULCERS  *URINARY PROBLEMS  *BOWEL PROBLEMS  UNUSUAL RASH Items with * indicate a potential emergency and should be followed up as soon as possible. If you have an emergency after office hours please contact your primary care physician or go to the nearest emergency department.  Please call the clinic during office hours if you have any questions or concerns.   You may also contact the Patient Navigator at [phone] should you have any questions or need assistance in obtaining follow up care.      Resources For Cancer Patients and their Caregivers ? American Cancer Society: Can assist with transportation, wigs, general needs, runs Look Good Feel Better.        1-888-227-6333 ? Cancer Care: Provides financial assistance, online support groups, medication/co-pay assistance.  1-800-813-HOPE (4673) ? [facility] Assists [place] cancer  patients and their families through emotional , educational and financial support.  336-427-4357 ? [place] DSS Where to apply for food stamps, Medicaid and utility assistance. 336-342-1394 ? RCATS: Transportation to medical appointments. 336-347-2287 ? Social Security Administration: May apply for disability if have a Stage IV cancer. 336-342-7796 1-800-772-1213 ? [place] Aging, Disability and Transit Services: Assists with nutrition, care and transit needs. [phone]         

## 2017-04-15 ENCOUNTER — Other Ambulatory Visit: Payer: Self-pay | Admitting: Gastroenterology

## 2017-04-18 ENCOUNTER — Encounter: Payer: Self-pay | Admitting: Gastroenterology

## 2017-04-18 NOTE — Telephone Encounter (Signed)
PT is aware to schedule OV.

## 2017-04-18 NOTE — Telephone Encounter (Signed)
Please tell the patient I can send in a limited refill but she will need an OV for further refills because we haven't seen her in over 2 years.

## 2017-04-19 ENCOUNTER — Ambulatory Visit (HOSPITAL_COMMUNITY): Payer: 59 | Admitting: Oncology

## 2017-04-19 ENCOUNTER — Encounter (HOSPITAL_BASED_OUTPATIENT_CLINIC_OR_DEPARTMENT_OTHER): Payer: 59

## 2017-04-19 ENCOUNTER — Encounter (HOSPITAL_COMMUNITY): Payer: Self-pay

## 2017-04-19 ENCOUNTER — Encounter (HOSPITAL_BASED_OUTPATIENT_CLINIC_OR_DEPARTMENT_OTHER): Payer: 59 | Admitting: Oncology

## 2017-04-19 ENCOUNTER — Ambulatory Visit (HOSPITAL_COMMUNITY): Payer: 59

## 2017-04-19 ENCOUNTER — Other Ambulatory Visit (HOSPITAL_COMMUNITY): Payer: Self-pay | Admitting: Oncology

## 2017-04-19 ENCOUNTER — Encounter (HOSPITAL_COMMUNITY): Payer: 59

## 2017-04-19 VITALS — BP 100/61 | HR 77 | Temp 98.5°F | Resp 18

## 2017-04-19 VITALS — BP 96/61 | HR 88 | Temp 98.6°F | Resp 16 | Wt 92.4 lb

## 2017-04-19 DIAGNOSIS — C7971 Secondary malignant neoplasm of right adrenal gland: Secondary | ICD-10-CM

## 2017-04-19 DIAGNOSIS — Z5112 Encounter for antineoplastic immunotherapy: Secondary | ICD-10-CM

## 2017-04-19 DIAGNOSIS — G4700 Insomnia, unspecified: Secondary | ICD-10-CM

## 2017-04-19 DIAGNOSIS — C3411 Malignant neoplasm of upper lobe, right bronchus or lung: Secondary | ICD-10-CM

## 2017-04-19 DIAGNOSIS — C7951 Secondary malignant neoplasm of bone: Secondary | ICD-10-CM | POA: Diagnosis not present

## 2017-04-19 DIAGNOSIS — C7972 Secondary malignant neoplasm of left adrenal gland: Secondary | ICD-10-CM | POA: Diagnosis not present

## 2017-04-19 DIAGNOSIS — E278 Other specified disorders of adrenal gland: Secondary | ICD-10-CM

## 2017-04-19 DIAGNOSIS — E032 Hypothyroidism due to medicaments and other exogenous substances: Secondary | ICD-10-CM

## 2017-04-19 DIAGNOSIS — F17200 Nicotine dependence, unspecified, uncomplicated: Secondary | ICD-10-CM

## 2017-04-19 LAB — CBC WITH DIFFERENTIAL/PLATELET
BASOS ABS: 0 10*3/uL (ref 0.0–0.1)
Basophils Relative: 0 %
Eosinophils Absolute: 0.1 10*3/uL (ref 0.0–0.7)
Eosinophils Relative: 2 %
HCT: 38.9 % (ref 36.0–46.0)
HEMOGLOBIN: 12.7 g/dL (ref 12.0–15.0)
Lymphocytes Relative: 22 %
Lymphs Abs: 1.7 10*3/uL (ref 0.7–4.0)
MCH: 29.7 pg (ref 26.0–34.0)
MCHC: 32.6 g/dL (ref 30.0–36.0)
MCV: 90.9 fL (ref 78.0–100.0)
Monocytes Absolute: 0.7 10*3/uL (ref 0.1–1.0)
Monocytes Relative: 10 %
NEUTROS ABS: 4.9 10*3/uL (ref 1.7–7.7)
Neutrophils Relative %: 66 %
Platelets: 262 10*3/uL (ref 150–400)
RBC: 4.28 MIL/uL (ref 3.87–5.11)
RDW: 16.1 % — ABNORMAL HIGH (ref 11.5–15.5)
WBC: 7.5 10*3/uL (ref 4.0–10.5)

## 2017-04-19 LAB — COMPREHENSIVE METABOLIC PANEL
ALK PHOS: 67 U/L (ref 38–126)
ALT: 10 U/L — AB (ref 14–54)
AST: 18 U/L (ref 15–41)
Albumin: 4 g/dL (ref 3.5–5.0)
Anion gap: 7 (ref 5–15)
BUN: 16 mg/dL (ref 6–20)
CALCIUM: 10.1 mg/dL (ref 8.9–10.3)
CHLORIDE: 104 mmol/L (ref 101–111)
CO2: 25 mmol/L (ref 22–32)
CREATININE: 1.51 mg/dL — AB (ref 0.44–1.00)
GFR calc Af Amer: 42 mL/min — ABNORMAL LOW (ref >60)
GFR calc non Af Amer: 36 mL/min — ABNORMAL LOW (ref >60)
Glucose, Bld: 106 mg/dL — ABNORMAL HIGH (ref 65–99)
Potassium: 4.2 mmol/L (ref 3.5–5.1)
Sodium: 136 mmol/L (ref 135–145)
Total Bilirubin: 0.3 mg/dL (ref 0.3–1.2)
Total Protein: 7.9 g/dL (ref 6.5–8.1)

## 2017-04-19 LAB — TSH: TSH: 0.705 u[IU]/mL (ref 0.350–4.500)

## 2017-04-19 MED ORDER — LEVOTHYROXINE SODIUM 25 MCG PO TABS: 12.5000 ug | ORAL_TABLET | Freq: Every day | ORAL | 1 refills | Status: DC

## 2017-04-19 MED ORDER — SODIUM CHLORIDE 0.9 % IV SOLN
240.0000 mg | Freq: Once | INTRAVENOUS | Status: AC
  Administered 2017-04-19: 240 mg via INTRAVENOUS
  Filled 2017-04-19: qty 24

## 2017-04-19 MED ORDER — SODIUM CHLORIDE 0.9 % IV SOLN
Freq: Once | INTRAVENOUS | Status: AC
  Administered 2017-04-19: 10:00:00 via INTRAVENOUS

## 2017-04-19 MED ORDER — HEPARIN SOD (PORK) LOCK FLUSH 100 UNIT/ML IV SOLN
INTRAVENOUS | Status: AC
  Filled 2017-04-19: qty 5

## 2017-04-19 MED ORDER — HEPARIN SOD (PORK) LOCK FLUSH 100 UNIT/ML IV SOLN
500.0000 [IU] | Freq: Once | INTRAVENOUS | Status: AC | PRN
  Administered 2017-04-19: 500 [IU]

## 2017-04-19 NOTE — Progress Notes (Signed)
Tolerated infusion w/o adverse reaction.  Alert, in no distress.  VSS.  Discharged ambulatory.  

## 2017-04-19 NOTE — Progress Notes (Signed)
[name], MD [place] [address]  CURRENT THERAPY:Nivolumab every 2 weeks.  Xgeva every 4 weeks.  INTERVAL HISTORY: Melissa Sullivan 61 y.o. female returns for followup of Stage IV adenocarcinoma, pancoast tumor, of right lung.  (EGFR and ALK negative) complicated by right brachial plexus neuralgia with disease on presentation including bilateral adrenal metastases, retroperitoneal lymph nodes, and osseous involvement.    Cancer of upper lobe of right lung ([place])   07/28/2014 Imaging    CT chest: Large R apical mass consistent with malignancy. This is destroying the R 2nd rib with extension into adjacent soft tissue. R hilar adenopathy with R 5cm adrenal metastatic lesion.      08/01/2014 Initial Biopsy    Lung, needle/core biopsy(ies), right upper lobe - POORLY DIFFERENTIATED ADENOCARCINOMA, SEE COMMENT.      08/08/2014 PET scan    Large hypermetabolic R apical mass with evidence of direct chest wall and mediastinal invasion, right retrocrural lymphadenopathy, extensive retroperitoneal lymphadenopathy, and metastatic lesions to the adrenal glands       09/02/2014 - 11/04/2014 Chemotherapy    Cisplatin/Pemetrexed/Avastin every 21 days x 4 cycles      10/07/2014 - 10/27/2014 Radiation Therapy    Right lung apex for control of brachioplexopathy.      12/24/2014 - 02/25/2015 Chemotherapy    Alimta/Avastin every 21 days.      02/20/2015 Imaging    Increase in size of right adrenal metastasis and subjacent confluent retrocaval lymphadenopathy      02/25/2015 -  Chemotherapy    Nivolumab, zometa      05/04/2015 Imaging    CT CAP- Stable to slight decrease in the posterior right apical lesion. Stable appearance of posterior right upper rib an upper thoracic bony lesions. Slight improvement in right upper lobe tree-in-bud opacity. No new or progressive findings in...      07/28/2015 Imaging    CT CAP- Reduced size of the right apical pleural parenchymal  lesion and reduced size of the right adrenal metastatic lesion. Resolution of prior retrocrural adenopathy.  Right eccentric T1 and T2 sclerosis with sclerosis and tapering of the right second..      11/17/2015 Imaging    CT CAP- Stable soft tissue thickening in the apex of the right hemi thorax. Stable right adrenal metastasis. Nodularity along the trachea and mainstem bronchi, relatively new from 07/28/2015, favoring adherent debris.      11/18/2015 Treatment Plan Change    Zometa HELD for upcoming tooth extraction      11/24/2015 Treatment Plan Change    Zometa on hold at this time in preparation for tooth extraction in March 2017.  Zometa las given on 11/18/2015.      02/03/2016 Imaging    CT CAP- Heterogeneous right apical masslike consolidation and right adrenal metastasis are unchanged      04/06/2016 Treatment Plan Change    Zometa restarted 6 weeks out from tooth extraction (04/06/2016)      05/12/2016 Imaging    CT CAP- NED in the chest, abdomen or pelvis.  Some areas of nodularity associated with the mainstem bronchi in the left upper lobe bronchus, favored to represent adherent inspissated secretions      08/17/2016 Imaging    CT CAP- 1. Stable CTs of the chest and abdomen. No evidence of progressive metastatic disease. 2. Probable treated tumor at the right apex, right adrenal gland and T2 vertebral body, stable. 3. Fluctuating nodularity along the walls  of the trachea and mainstem bronchi, likely secretions.      12/12/2016 Imaging    Further decrease in size of treated tumor within the right apex. 2. Stable treated tumor involving the right second rib and T2 vertebra. 3. Stable right adrenal gland treated tumor. 4. Emphysema 5. Aortic atherosclerosis      01/04/2017 Imaging    MRI brain- Normal brain MRI.  No intracranial metastatic disease.      03/15/2017 Imaging    CT CAP- 1. No new or progressive metastatic disease in the chest or abdomen. 2. Stable treated  tumor in the apical right upper lobe. Stable treated right posterior second rib and right T2 vertebral lesions. Stable treated right adrenal metastasis. 3. Aortic atherosclerosis. 4. Moderate emphysema with mild diffuse bronchial wall thickening, suggesting COPD.        Patient presented for follow up today. Overall she is doing well. She continues to smoke and states she currently smokes 2 packs a week. She states that her energy level is good. Her appetite is good. She does have issues with insomnia in that she has trouble staying asleep and will wake up multiple times at night. She is tolerating opdivo well without any side effects.   Review of Systems  Constitutional: Negative.  Negative for chills, fever and weight loss.  HENT: Negative.  Negative for hearing loss, sore throat and tinnitus.   Eyes: Negative.  Negative for blurred vision, photophobia and discharge.  Respiratory: Negative.  Negative for cough, hemoptysis, shortness of breath and wheezing.   Cardiovascular: Negative.  Negative for chest pain, palpitations, orthopnea, claudication and leg swelling.  Gastrointestinal: Negative.  Negative for abdominal pain, blood in stool, constipation, diarrhea, melena, nausea and vomiting.  Genitourinary: Negative.  Negative for dysuria and hematuria.  Musculoskeletal: Negative.  Negative for back pain, joint pain and myalgias.  Skin: Negative.  Negative for itching and rash.  Neurological: Negative.  Negative for dizziness, weakness and headaches.  Endo/Heme/Allergies: Negative.  Negative for environmental allergies and polydipsia. Does not bruise/bleed easily.  Psychiatric/Behavioral: Negative for depression. The patient has insomnia. The patient is not nervous/anxious.     Past Medical History:  Diagnosis Date  . Adrenal mass, right ([place]) 07/28/2014  . Anemia   . Bone metastases ([place]) 04/05/2016  . Cancer ([place])    lung  right  . Diabetes mellitus without complication ([place])   .  GERD (gastroesophageal reflux disease)   . Hyperlipidemia   . Hypertension   . Hypothyroidism due to medication 01/30/2017  . Lung mass 07/28/2014  . Reflux     Past Surgical History:  Procedure Laterality Date  . AMPUTATION Right 02/22/2017   Procedure: PARTIAL AMPUTATION RIGHT GREAT TOE;  Surgeon: Caprice Beaver, DPM;  Location: [facility] OR[facility];  Service: Podiatry;  Laterality: Right;  . APPENDECTOMY    . ESOPHAGOGASTRODUODENOSCOPY N/A 12/09/2014   LTJ:QZESPQ esophageal stricture/mild-to-noderate erosive gastritis. negative H.pylori  . FLEXIBLE SIGMOIDOSCOPY  2011   Dr. [name]: hyperplastic polyp  . LUNG BIOPSY Right 07/2014   CT guided  . MALONEY DILATION N/A 12/09/2014   Procedure: [name] DILATION;  Surgeon: [name], MD;  Location: [facility];  Service: Endoscopy;  Laterality: N/A;  . PORTACATH PLACEMENT Left 09/01/2014  . SAVORY DILATION N/A 12/09/2014   Procedure: SAVORY DILATION;  Surgeon: [name], MD;  Location: [facility];  Service: Endoscopy;  Laterality: N/A;    Family History  Problem Relation Age of Onset  . Cancer Sister  Social History   Social History  . Marital status: Married    Spouse name: N/A  . Number of children: N/A  . Years of education: N/A   Social History Main Topics  . Smoking status: Light Tobacco Smoker    Packs/day: 0.30    Years: 20.00    Types: Cigarettes  . Smokeless tobacco: Never Used  . Alcohol use No  . Drug use: No  . Sexual activity: Not Asked   Other Topics Concern  . None   Social History Narrative  . None     PHYSICAL EXAMINATION  ECOG PERFORMANCE STATUS: 0 - Asymptomatic  Vitals:   04/19/17 0845  BP: 96/61  Pulse: 88  Resp: 16  Temp: 98.6 F (37 C)    Physical Exam  Constitutional: She is oriented to person, place, and time and well-developed, well-nourished, and in no distress. No distress.  HENT:  Head: Normocephalic and atraumatic.  Mouth/Throat: No oropharyngeal exudate.  Eyes:  Conjunctivae are normal. Pupils are equal, round, and reactive to light. No scleral icterus.  Neck: Normal range of motion. Neck supple. No JVD present.  Cardiovascular: Normal rate, regular rhythm and normal heart sounds.  Exam reveals no gallop and no friction rub.   No murmur heard. Pulmonary/Chest: Breath sounds normal. No respiratory distress. She has no wheezes. She has no rales.  Abdominal: Soft. Bowel sounds are normal. She exhibits no distension. There is no tenderness. There is no guarding.  Musculoskeletal: She exhibits no edema or tenderness.  Lymphadenopathy:    She has no cervical adenopathy.  Neurological: She is alert and oriented to person, place, and time. No cranial nerve deficit.  Skin: Skin is warm and dry. No rash noted. No erythema. No pallor.  Psychiatric: Affect and judgment normal.    LABORATORY DATA: CBC    Component Value Date/Time   WBC 7.5 04/19/2017 0826   RBC 4.28 04/19/2017 0826   HGB 12.7 04/19/2017 0826   HCT 38.9 04/19/2017 0826   PLT 262 04/19/2017 0826   MCV 90.9 04/19/2017 0826   MCH 29.7 04/19/2017 0826   MCHC 32.6 04/19/2017 0826   RDW 16.1 (H) 04/19/2017 0826   LYMPHSABS 1.7 04/19/2017 0826   MONOABS 0.7 04/19/2017 0826   EOSABS 0.1 04/19/2017 0826   BASOSABS 0.0 04/19/2017 0826      Chemistry      Component Value Date/Time   NA 136 04/19/2017 0826   K 4.2 04/19/2017 0826   CL 104 04/19/2017 0826   CO2 25 04/19/2017 0826   BUN 16 04/19/2017 0826   CREATININE 1.51 (H) 04/19/2017 0826      Component Value Date/Time   CALCIUM 10.1 04/19/2017 0826   CALCIUM 9.7 06/03/2015 1000   ALKPHOS 67 04/19/2017 0826   AST 18 04/19/2017 0826   ALT 10 (L) 04/19/2017 0826   BILITOT 0.3 04/19/2017 0826     Lab Results  Component Value Date   TSH 0.705 04/19/2017     PENDING LABS:   RADIOGRAPHIC STUDIES:  No results found.   PATHOLOGY:    ASSESSMENT AND PLAN:  Stage IV adenocarcinoma, pancoast tumor, of right lung.  PLAN:    Continue with Nivolumab every 2 weeks and Xgeva every 4 weeks.  She will get opdivo today. Will transition to Nivolumab every 4 weeks once approved. Plan to restage with scans in the end of July, which will be 3 months from her last scans. RTC in 4 weeks for follow up.  All questions were  answered. The patient knows to call the clinic with any problems, questions or concerns. We can certainly see the patient much sooner if necessary.   This note is electronically signed by: Twana First, MD 04/19/2017 9:38 AM

## 2017-04-19 NOTE — Patient Instructions (Addendum)
[place] at [facility] Discharge Instructions  RECOMMENDATIONS MADE BY THE CONSULTANT AND ANY TEST RESULTS WILL BE SENT TO YOUR REFERRING PHYSICIAN.  You were seen today by Dr. Twana First Treatment in 2 weeks Follow up in 4 weeks   Thank you for choosing [place] at [facility] to provide your oncology and hematology care.  To afford each patient quality time with our provider, please arrive at least 15 minutes before your scheduled appointment time.    If you have a lab appointment with the [facility] please come in thru the  Main Entrance and check in at the main information desk  You need to re-schedule your appointment should you arrive 10 or more minutes late.  We strive to give you quality time with our providers, and arriving late affects you and other patients whose appointments are after yours.  Also, if you no show three or more times for appointments you may be dismissed from the clinic at the providers discretion.     Again, thank you for choosing [facility].  Our hope is that these requests will decrease the amount of time that you wait before being seen by our physicians.       _____________________________________________________________  Should you have questions after your visit to [facility], please contact our office at [phone] between the hours of 8:30 a.m. and 4:30 p.m.  Voicemails left after 4:30 p.m. will not be returned until the following business day.  For prescription refill requests, have your pharmacy contact our office.       Resources For Cancer Patients and their Caregivers ? American Cancer Society: Can assist with transportation, wigs, general needs, runs Look Good Feel Better.        ([phone] ? Cancer Care: Provides financial assistance, online support groups, medication/co-pay assistance.  1-800-813-HOPE (763) 594-8447) ? [facility] Assists [place] cancer patients and their families through emotional , educational and financial support.  4124608736 ? [place] DSS Where to apply for food stamps, Medicaid and utility assistance. 438-766-9416 ? RCATS: Transportation to medical appointments. 830-669-6277 ? Social Security Administration: May apply for disability if have a Stage IV cancer. ([phone] 2365305481 ? [employer], Disability and Transit Services: Assists with nutrition, care and transit needs. [place] Support Programs: @10RELATIVEDAYS @ > Cancer Support Group  2nd Tuesday of the month 1pm-2pm, Journey Room  > Creative Journey  3rd Tuesday of the month 1130am-1pm, Journey Room  > Look Good Feel Better  1st Wednesday of the month 10am-12 noon, Journey Room (Call [place] to register [phone])

## 2017-04-19 NOTE — Patient Instructions (Signed)
[facility] Discharge Instructions for Patients Receiving Chemotherapy   Beginning January 23rd 2017 lab work for the [facility] will be done in the  Main lab at [facility] on 1st floor. If you have a lab appointment with the [facility] please come in thru the  Main Entrance and check in at the main information desk   Today you received the following chemotherapy agents:  Opdivo  If you develop nausea and vomiting, or diarrhea that is not controlled by your medication, call the clinic.  The clinic phone number is [phone]. Office hours are Monday-Friday 8:30am-5:00pm.  BELOW ARE SYMPTOMS THAT SHOULD BE REPORTED IMMEDIATELY:  *FEVER GREATER THAN 101.0 F  *CHILLS WITH OR WITHOUT FEVER  NAUSEA AND VOMITING THAT IS NOT CONTROLLED WITH YOUR NAUSEA MEDICATION  *UNUSUAL SHORTNESS OF BREATH  *UNUSUAL BRUISING OR BLEEDING  TENDERNESS IN MOUTH AND THROAT WITH OR WITHOUT PRESENCE OF ULCERS  *URINARY PROBLEMS  *BOWEL PROBLEMS  UNUSUAL RASH Items with * indicate a potential emergency and should be followed up as soon as possible. If you have an emergency after office hours please contact your primary care physician or go to the nearest emergency department.  Please call the clinic during office hours if you have any questions or concerns.   You may also contact the Patient Navigator at [phone] should you have any questions or need assistance in obtaining follow up care.      Resources For Cancer Patients and their Caregivers ? American Cancer Society: Can assist with transportation, wigs, general needs, runs Look Good Feel Better.        954-352-0643 ? Cancer Care: Provides financial assistance, online support groups, medication/co-pay assistance.  1-800-813-HOPE 4126367346) ? [facility] Assists [place] cancer patients and their families through emotional , educational and financial support.   ([phone] 740-3561 ? [place] DSS Where to apply for food stamps, Medicaid and utility assistance. 530-761-7179 ? RCATS: Transportation to medical appointments. [phone] ? Social Security Administration: May apply for disability if have a Stage IV cancer. 2531628861 [phone])550-9076 ? [employer], Disability and Transit Services: Assists with nutrition, care and transit needs. [phone]

## 2017-05-03 ENCOUNTER — Encounter (HOSPITAL_BASED_OUTPATIENT_CLINIC_OR_DEPARTMENT_OTHER): Payer: 59

## 2017-05-03 ENCOUNTER — Encounter (HOSPITAL_COMMUNITY): Payer: Self-pay

## 2017-05-03 ENCOUNTER — Encounter (HOSPITAL_COMMUNITY): Payer: 59 | Attending: Oncology

## 2017-05-03 VITALS — BP 109/70 | HR 84 | Temp 98.5°F | Resp 18 | Wt 92.6 lb

## 2017-05-03 DIAGNOSIS — E032 Hypothyroidism due to medicaments and other exogenous substances: Secondary | ICD-10-CM

## 2017-05-03 DIAGNOSIS — E119 Type 2 diabetes mellitus without complications: Secondary | ICD-10-CM | POA: Insufficient documentation

## 2017-05-03 DIAGNOSIS — E785 Hyperlipidemia, unspecified: Secondary | ICD-10-CM | POA: Diagnosis not present

## 2017-05-03 DIAGNOSIS — E279 Disorder of adrenal gland, unspecified: Secondary | ICD-10-CM | POA: Diagnosis not present

## 2017-05-03 DIAGNOSIS — C3411 Malignant neoplasm of upper lobe, right bronchus or lung: Secondary | ICD-10-CM | POA: Diagnosis present

## 2017-05-03 DIAGNOSIS — F17210 Nicotine dependence, cigarettes, uncomplicated: Secondary | ICD-10-CM | POA: Insufficient documentation

## 2017-05-03 DIAGNOSIS — C7951 Secondary malignant neoplasm of bone: Secondary | ICD-10-CM

## 2017-05-03 DIAGNOSIS — Z5112 Encounter for antineoplastic immunotherapy: Secondary | ICD-10-CM | POA: Diagnosis not present

## 2017-05-03 DIAGNOSIS — E039 Hypothyroidism, unspecified: Secondary | ICD-10-CM | POA: Insufficient documentation

## 2017-05-03 DIAGNOSIS — I10 Essential (primary) hypertension: Secondary | ICD-10-CM | POA: Insufficient documentation

## 2017-05-03 DIAGNOSIS — E278 Other specified disorders of adrenal gland: Secondary | ICD-10-CM

## 2017-05-03 DIAGNOSIS — K219 Gastro-esophageal reflux disease without esophagitis: Secondary | ICD-10-CM | POA: Diagnosis not present

## 2017-05-03 LAB — COMPREHENSIVE METABOLIC PANEL
ALT: 13 U/L — AB (ref 14–54)
AST: 19 U/L (ref 15–41)
Albumin: 4.1 g/dL (ref 3.5–5.0)
Alkaline Phosphatase: 57 U/L (ref 38–126)
Anion gap: 8 (ref 5–15)
BUN: 17 mg/dL (ref 6–20)
CHLORIDE: 103 mmol/L (ref 101–111)
CO2: 24 mmol/L (ref 22–32)
CREATININE: 1.23 mg/dL — AB (ref 0.44–1.00)
Calcium: 10 mg/dL (ref 8.9–10.3)
GFR calc non Af Amer: 47 mL/min — ABNORMAL LOW (ref >60)
GFR, EST AFRICAN AMERICAN: 54 mL/min — AB (ref >60)
Glucose, Bld: 92 mg/dL (ref 65–99)
POTASSIUM: 4.6 mmol/L (ref 3.5–5.1)
SODIUM: 135 mmol/L (ref 135–145)
Total Bilirubin: 0.3 mg/dL (ref 0.3–1.2)
Total Protein: 7.9 g/dL (ref 6.5–8.1)

## 2017-05-03 LAB — TSH: TSH: 2.544 u[IU]/mL (ref 0.350–4.500)

## 2017-05-03 LAB — CBC WITH DIFFERENTIAL/PLATELET
BASOS ABS: 0 10*3/uL (ref 0.0–0.1)
Basophils Relative: 0 %
EOS ABS: 0.1 10*3/uL (ref 0.0–0.7)
EOS PCT: 1 %
HCT: 38.6 % (ref 36.0–46.0)
Hemoglobin: 12.7 g/dL (ref 12.0–15.0)
LYMPHS ABS: 2.5 10*3/uL (ref 0.7–4.0)
Lymphocytes Relative: 27 %
MCH: 29.7 pg (ref 26.0–34.0)
MCHC: 32.9 g/dL (ref 30.0–36.0)
MCV: 90.2 fL (ref 78.0–100.0)
Monocytes Absolute: 0.6 10*3/uL (ref 0.1–1.0)
Monocytes Relative: 7 %
Neutro Abs: 6 10*3/uL (ref 1.7–7.7)
Neutrophils Relative %: 65 %
PLATELETS: 270 10*3/uL (ref 150–400)
RBC: 4.28 MIL/uL (ref 3.87–5.11)
RDW: 15.6 % — ABNORMAL HIGH (ref 11.5–15.5)
WBC: 9.3 10*3/uL (ref 4.0–10.5)

## 2017-05-03 MED ORDER — HEPARIN SOD (PORK) LOCK FLUSH 100 UNIT/ML IV SOLN
500.0000 [IU] | Freq: Once | INTRAVENOUS | Status: AC | PRN
  Administered 2017-05-03: 500 [IU]

## 2017-05-03 MED ORDER — HEPARIN SOD (PORK) LOCK FLUSH 100 UNIT/ML IV SOLN
INTRAVENOUS | Status: AC
  Filled 2017-05-03: qty 5

## 2017-05-03 MED ORDER — SODIUM CHLORIDE 0.9 % IV SOLN
240.0000 mg | Freq: Once | INTRAVENOUS | Status: AC
  Administered 2017-05-03: 240 mg via INTRAVENOUS
  Filled 2017-05-03: qty 24

## 2017-05-03 MED ORDER — SODIUM CHLORIDE 0.9 % IV SOLN
Freq: Once | INTRAVENOUS | Status: AC
  Administered 2017-05-03: 12:00:00 via INTRAVENOUS

## 2017-05-03 MED ORDER — DENOSUMAB 120 MG/1.7ML ~~LOC~~ SOLN
120.0000 mg | Freq: Once | SUBCUTANEOUS | Status: AC
  Administered 2017-05-03: 120 mg via SUBCUTANEOUS
  Filled 2017-05-03: qty 1.7

## 2017-05-03 MED ORDER — SODIUM CHLORIDE 0.9 % IJ SOLN
10.0000 mL | INTRAMUSCULAR | Status: DC | PRN
  Administered 2017-05-03: 10 mL
  Filled 2017-05-03: qty 10

## 2017-05-03 NOTE — Progress Notes (Signed)
Patient does not have any upcoming dental surgeries planned but states that she will be planning one in the future. She understands that she has to be off XGEVA prior to having dental work done. She will let us know when she plans on scheduling dental work.

## 2017-05-03 NOTE — Patient Instructions (Signed)
[facility] Discharge Instructions for Patients Receiving Chemotherapy   Beginning January 23rd 2017 lab work for the [facility] will be done in the  Main lab at [facility] on 1st floor. If you have a lab appointment with the [facility] please come in thru the  Main Entrance and check in at the main information desk   Today you received the following agents: Opdivo infusion and Xgeva injection Follow up as scheduled.  To help prevent nausea and vomiting after your treatment, we encourage you to take your nausea medication as prescribed.   If you develop nausea and vomiting, or diarrhea that is not controlled by your medication, call the clinic.  The clinic phone number is [phone]. Office hours are Monday-Friday 8:30am-5:00pm.  BELOW ARE SYMPTOMS THAT SHOULD BE REPORTED IMMEDIATELY:  *FEVER GREATER THAN 101.0 F  *CHILLS WITH OR WITHOUT FEVER  NAUSEA AND VOMITING THAT IS NOT CONTROLLED WITH YOUR NAUSEA MEDICATION  *UNUSUAL SHORTNESS OF BREATH  *UNUSUAL BRUISING OR BLEEDING  TENDERNESS IN MOUTH AND THROAT WITH OR WITHOUT PRESENCE OF ULCERS  *URINARY PROBLEMS  *BOWEL PROBLEMS  UNUSUAL RASH Items with * indicate a potential emergency and should be followed up as soon as possible. If you have an emergency after office hours please contact your primary care physician or go to the nearest emergency department.  Please call the clinic during office hours if you have any questions or concerns.   You may also contact the Patient Navigator at [phone] should you have any questions or need assistance in obtaining follow up care.      Resources For Cancer Patients and their Caregivers ? American Cancer Society: Can assist with transportation, wigs, general needs, runs Look Good Feel Better.        415-135-5275 ? Cancer Care: Provides financial assistance, online support groups, medication/co-pay assistance.  1-800-813-HOPE (630) 082-6671) ? [employer] Assists [place] cancer patients and their families through emotional , educational and financial support.  302-191-9430 ? [place] DSS Where to apply for food stamps, Medicaid and utility assistance. 747-475-7353 ? RCATS: Transportation to medical appointments. 704-278-0413 ? Social Security Administration: May apply for disability if have a Stage IV cancer. 579-798-6264 8256826925 ? [employer], Disability and Transit Services: Assists with nutrition, care and transit needs. [phone]

## 2017-05-03 NOTE — Progress Notes (Signed)
Melissa Sullivan presents today for injection per MD orders. Xgeva 120 mg administered SQ in left lower abdomen. Administration without incident. Patient tolerated well.  Patient tolerated Opdivo infusion today without complaint or reactions. Patient discharged from clinic ambulatory with daughter in stable condition. Follow up as scheduled in 2 weeks.

## 2017-05-17 ENCOUNTER — Encounter (HOSPITAL_BASED_OUTPATIENT_CLINIC_OR_DEPARTMENT_OTHER): Payer: 59

## 2017-05-17 ENCOUNTER — Encounter (HOSPITAL_COMMUNITY): Payer: 59

## 2017-05-17 ENCOUNTER — Encounter (HOSPITAL_COMMUNITY): Payer: Self-pay

## 2017-05-17 ENCOUNTER — Encounter (HOSPITAL_BASED_OUTPATIENT_CLINIC_OR_DEPARTMENT_OTHER): Payer: 59 | Admitting: Oncology

## 2017-05-17 VITALS — BP 101/55 | HR 82 | Temp 97.9°F | Resp 18 | Wt 94.2 lb

## 2017-05-17 DIAGNOSIS — C3411 Malignant neoplasm of upper lobe, right bronchus or lung: Secondary | ICD-10-CM

## 2017-05-17 DIAGNOSIS — C7971 Secondary malignant neoplasm of right adrenal gland: Secondary | ICD-10-CM

## 2017-05-17 DIAGNOSIS — C7951 Secondary malignant neoplasm of bone: Secondary | ICD-10-CM

## 2017-05-17 DIAGNOSIS — C7972 Secondary malignant neoplasm of left adrenal gland: Secondary | ICD-10-CM

## 2017-05-17 DIAGNOSIS — Z5112 Encounter for antineoplastic immunotherapy: Secondary | ICD-10-CM | POA: Diagnosis not present

## 2017-05-17 DIAGNOSIS — E278 Other specified disorders of adrenal gland: Secondary | ICD-10-CM

## 2017-05-17 DIAGNOSIS — E032 Hypothyroidism due to medicaments and other exogenous substances: Secondary | ICD-10-CM

## 2017-05-17 LAB — CBC WITH DIFFERENTIAL/PLATELET
Basophils Absolute: 0 10*3/uL (ref 0.0–0.1)
Basophils Relative: 0 %
EOS PCT: 2 %
Eosinophils Absolute: 0.1 10*3/uL (ref 0.0–0.7)
HEMATOCRIT: 38.9 % (ref 36.0–46.0)
Hemoglobin: 13 g/dL (ref 12.0–15.0)
Lymphocytes Relative: 27 %
Lymphs Abs: 2.1 10*3/uL (ref 0.7–4.0)
MCH: 30.1 pg (ref 26.0–34.0)
MCHC: 33.4 g/dL (ref 30.0–36.0)
MCV: 90 fL (ref 78.0–100.0)
MONO ABS: 0.7 10*3/uL (ref 0.1–1.0)
MONOS PCT: 8 %
Neutro Abs: 4.8 10*3/uL (ref 1.7–7.7)
Neutrophils Relative %: 63 %
PLATELETS: 267 10*3/uL (ref 150–400)
RBC: 4.32 MIL/uL (ref 3.87–5.11)
RDW: 15.6 % — AB (ref 11.5–15.5)
WBC: 7.8 10*3/uL (ref 4.0–10.5)

## 2017-05-17 LAB — COMPREHENSIVE METABOLIC PANEL
ALT: 11 U/L — ABNORMAL LOW (ref 14–54)
ANION GAP: 7 (ref 5–15)
AST: 18 U/L (ref 15–41)
Albumin: 4.3 g/dL (ref 3.5–5.0)
Alkaline Phosphatase: 65 U/L (ref 38–126)
BUN: 15 mg/dL (ref 6–20)
CHLORIDE: 105 mmol/L (ref 101–111)
CO2: 25 mmol/L (ref 22–32)
Calcium: 10.1 mg/dL (ref 8.9–10.3)
Creatinine, Ser: 1.33 mg/dL — ABNORMAL HIGH (ref 0.44–1.00)
GFR, EST AFRICAN AMERICAN: 49 mL/min — AB (ref >60)
GFR, EST NON AFRICAN AMERICAN: 42 mL/min — AB (ref >60)
Glucose, Bld: 101 mg/dL — ABNORMAL HIGH (ref 65–99)
Potassium: 3.8 mmol/L (ref 3.5–5.1)
Sodium: 137 mmol/L (ref 135–145)
TOTAL PROTEIN: 8.1 g/dL (ref 6.5–8.1)
Total Bilirubin: 0.5 mg/dL (ref 0.3–1.2)

## 2017-05-17 LAB — TSH: TSH: 1.955 u[IU]/mL (ref 0.350–4.500)

## 2017-05-17 MED ORDER — SODIUM CHLORIDE 0.9 % IV SOLN
240.0000 mg | Freq: Once | INTRAVENOUS | Status: AC
  Administered 2017-05-17: 240 mg via INTRAVENOUS
  Filled 2017-05-17: qty 24

## 2017-05-17 MED ORDER — SODIUM CHLORIDE 0.9 % IJ SOLN
10.0000 mL | INTRAMUSCULAR | Status: DC | PRN
  Administered 2017-05-17: 10 mL
  Filled 2017-05-17: qty 10

## 2017-05-17 MED ORDER — SODIUM CHLORIDE 0.9 % IV SOLN
Freq: Once | INTRAVENOUS | Status: AC
  Administered 2017-05-17: 09:00:00 via INTRAVENOUS

## 2017-05-17 MED ORDER — HEPARIN SOD (PORK) LOCK FLUSH 100 UNIT/ML IV SOLN
500.0000 [IU] | Freq: Once | INTRAVENOUS | Status: AC | PRN
  Administered 2017-05-17: 500 [IU]

## 2017-05-17 NOTE — Progress Notes (Signed)
Melissa Squibb, MD [place] [address]  CURRENT THERAPY:Nivolumab every 2 weeks.  Xgeva every 4 weeks.  INTERVAL HISTORY: Melissa Sullivan 61 y.o. female returns for followup of Stage IV adenocarcinoma, pancoast tumor, of right lung.  (EGFR and ALK negative) complicated by right brachial plexus neuralgia with disease on presentation including bilateral adrenal metastases, retroperitoneal lymph nodes, and osseous involvement.    Cancer of upper lobe of right lung ([place])   07/28/2014 Imaging    CT chest: Large R apical mass consistent with malignancy. This is destroying the R 2nd rib with extension into adjacent soft tissue. R hilar adenopathy with R 5cm adrenal metastatic lesion.      08/01/2014 Initial Biopsy    Lung, needle/core biopsy(ies), right upper lobe - POORLY DIFFERENTIATED ADENOCARCINOMA, SEE COMMENT.      08/08/2014 PET scan    Large hypermetabolic R apical mass with evidence of direct chest wall and mediastinal invasion, right retrocrural lymphadenopathy, extensive retroperitoneal lymphadenopathy, and metastatic lesions to the adrenal glands       09/02/2014 - 11/04/2014 Chemotherapy    Cisplatin/Pemetrexed/Avastin every 21 days x 4 cycles      10/07/2014 - 10/27/2014 Radiation Therapy    Right lung apex for control of brachioplexopathy.      12/24/2014 - 02/25/2015 Chemotherapy    Alimta/Avastin every 21 days.      02/20/2015 Imaging    Increase in size of right adrenal metastasis and subjacent confluent retrocaval lymphadenopathy      02/25/2015 -  Chemotherapy    Nivolumab, zometa      05/04/2015 Imaging    CT CAP- Stable to slight decrease in the posterior right apical lesion. Stable appearance of posterior right upper rib an upper thoracic bony lesions. Slight improvement in right upper lobe tree-in-bud opacity. No new or progressive findings in...      07/28/2015 Imaging    CT CAP- Reduced size of the right apical pleural parenchymal  lesion and reduced size of the right adrenal metastatic lesion. Resolution of prior retrocrural adenopathy.  Right eccentric T1 and T2 sclerosis with sclerosis and tapering of the right second..      11/17/2015 Imaging    CT CAP- Stable soft tissue thickening in the apex of the right hemi thorax. Stable right adrenal metastasis. Nodularity along the trachea and mainstem bronchi, relatively new from 07/28/2015, favoring adherent debris.      11/18/2015 Treatment Plan Change    Zometa HELD for upcoming tooth extraction      11/24/2015 Treatment Plan Change    Zometa on hold at this time in preparation for tooth extraction in March 2017.  Zometa las given on 11/18/2015.      02/03/2016 Imaging    CT CAP- Heterogeneous right apical masslike consolidation and right adrenal metastasis are unchanged      04/06/2016 Treatment Plan Change    Zometa restarted 6 weeks out from tooth extraction (04/06/2016)      05/12/2016 Imaging    CT CAP- NED in the chest, abdomen or pelvis.  Some areas of nodularity associated with the mainstem bronchi in the left upper lobe bronchus, favored to represent adherent inspissated secretions      08/17/2016 Imaging    CT CAP- 1. Stable CTs of the chest and abdomen. No evidence of progressive metastatic disease. 2. Probable treated tumor at the right apex, right adrenal gland and T2 vertebral body, stable. 3. Fluctuating nodularity along the walls  of the trachea and mainstem bronchi, likely secretions.      12/12/2016 Imaging    Further decrease in size of treated tumor within the right apex. 2. Stable treated tumor involving the right second rib and T2 vertebra. 3. Stable right adrenal gland treated tumor. 4. Emphysema 5. Aortic atherosclerosis      01/04/2017 Imaging    MRI brain- Normal brain MRI.  No intracranial metastatic disease.      03/15/2017 Imaging    CT CAP- 1. No new or progressive metastatic disease in the chest or abdomen. 2. Stable treated  tumor in the apical right upper lobe. Stable treated right posterior second rib and right T2 vertebral lesions. Stable treated right adrenal metastasis. 3. Aortic atherosclerosis. 4. Moderate emphysema with mild diffuse bronchial wall thickening, suggesting COPD.        Patient presented for follow up today. Overall she is doing well. Patient complains of decreased appetite. However she has not lost any weight and actually have gained 2 pounds. Otherwise she is tolerating opdivo well without any side effects.  Review of Systems  Constitutional: Negative.  Negative for chills, fever and weight loss.       Decreased appetite  HENT: Negative.  Negative for hearing loss, sore throat and tinnitus.   Eyes: Negative.  Negative for blurred vision, photophobia and discharge.  Respiratory: Negative.  Negative for cough, hemoptysis, shortness of breath and wheezing.   Cardiovascular: Negative.  Negative for chest pain, palpitations, orthopnea, claudication and leg swelling.  Gastrointestinal: Negative.  Negative for abdominal pain, blood in stool, constipation, diarrhea, melena, nausea and vomiting.  Genitourinary: Negative.  Negative for dysuria and hematuria.  Musculoskeletal: Negative.  Negative for back pain, joint pain and myalgias.  Skin: Negative.  Negative for itching and rash.  Neurological: Negative.  Negative for dizziness, weakness and headaches.  Endo/Heme/Allergies: Negative.  Negative for environmental allergies and polydipsia. Does not bruise/bleed easily.  Psychiatric/Behavioral: Negative for depression. The patient is not nervous/anxious and does not have insomnia.     Past Medical History:  Diagnosis Date  . Adrenal mass, right ([place]) 07/28/2014  . Anemia   . Bone metastases ([place]) 04/05/2016  . Cancer ([place])    lung  right  . Diabetes mellitus without complication ([place])   . GERD (gastroesophageal reflux disease)   . Hyperlipidemia   . Hypertension   . Hypothyroidism due to  medication 01/30/2017  . Lung mass 07/28/2014  . Reflux     Past Surgical History:  Procedure Laterality Date  . AMPUTATION Right 02/22/2017   Procedure: PARTIAL AMPUTATION RIGHT GREAT TOE;  Surgeon: Caprice Beaver, DPM;  Location: [facility];  Service: Podiatry;  Laterality: Right;  . APPENDECTOMY    . ESOPHAGOGASTRODUODENOSCOPY N/A 12/09/2014   [id number] esophageal stricture/mild-to-noderate erosive gastritis. negative H.pylori  . FLEXIBLE SIGMOIDOSCOPY  2011   Dr. [name]: hyperplastic polyp  . LUNG BIOPSY Right 07/2014   CT guided  . MALONEY DILATION N/A 12/09/2014   Procedure: [name] DILATION;  Surgeon: [name], MD;  Location: [facility];  Service: Endoscopy;  Laterality: N/A;  . PORTACATH PLACEMENT Left 09/01/2014  . SAVORY DILATION N/A 12/09/2014   Procedure: SAVORY DILATION;  Surgeon: [name], MD;  Location: [facility];  Service: Endoscopy;  Laterality: N/A;    Family History  Problem Relation Age of Onset  . Cancer Sister     Social History   Social History  . Marital status: Married    Spouse name: N/A  .  Number of children: N/A  . Years of education: N/A   Social History Main Topics  . Smoking status: Light Tobacco Smoker    Packs/day: 0.30    Years: 20.00    Types: Cigarettes  . Smokeless tobacco: Never Used  . Alcohol use No  . Drug use: No  . Sexual activity: Not Asked   Other Topics Concern  . None   Social History Narrative  . None     PHYSICAL EXAMINATION  ECOG PERFORMANCE STATUS: 0 - Asymptomatic   Physical Exam  Constitutional: She is oriented to person, place, and time and well-developed, well-nourished, and in no distress. No distress.  HENT:  Head: Normocephalic and atraumatic.  Mouth/Throat: No oropharyngeal exudate.  Eyes: Conjunctivae are normal. Pupils are equal, round, and reactive to light. No scleral icterus.  Neck: Normal range of motion. Neck supple. No JVD present.  Cardiovascular: Normal rate, regular rhythm  and normal heart sounds.  Exam reveals no gallop and no friction rub.   No murmur heard. Pulmonary/Chest: Effort normal. No respiratory distress. She has no wheezes. She has no rales.  Decreased breath sounds in RUL  Abdominal: Soft. Bowel sounds are normal. She exhibits no distension. There is no tenderness. There is no guarding.  Musculoskeletal: She exhibits no edema or tenderness.  Lymphadenopathy:    She has no cervical adenopathy.  Neurological: She is alert and oriented to person, place, and time. No cranial nerve deficit.  Skin: Skin is warm and dry. No rash noted. No erythema. No pallor.  Psychiatric: Affect and judgment normal.    LABORATORY DATA: CBC    Component Value Date/Time   WBC 7.8 05/17/2017 0813   RBC 4.32 05/17/2017 0813   HGB 13.0 05/17/2017 0813   HCT 38.9 05/17/2017 0813   PLT 267 05/17/2017 0813   MCV 90.0 05/17/2017 0813   MCH 30.1 05/17/2017 0813   MCHC 33.4 05/17/2017 0813   RDW 15.6 (H) 05/17/2017 0813   LYMPHSABS 2.1 05/17/2017 0813   MONOABS 0.7 05/17/2017 0813   EOSABS 0.1 05/17/2017 0813   BASOSABS 0.0 05/17/2017 0813      Chemistry      Component Value Date/Time   NA 137 05/17/2017 0813   K 3.8 05/17/2017 0813   CL 105 05/17/2017 0813   CO2 25 05/17/2017 0813   BUN 15 05/17/2017 0813   CREATININE 1.33 (H) 05/17/2017 0813      Component Value Date/Time   CALCIUM 10.1 05/17/2017 0813   CALCIUM 9.7 06/03/2015 1000   ALKPHOS 65 05/17/2017 0813   AST 18 05/17/2017 0813   ALT 11 (L) 05/17/2017 0813   BILITOT 0.5 05/17/2017 0813     Lab Results  Component Value Date   TSH 1.955 05/17/2017     PENDING LABS:   RADIOGRAPHIC STUDIES:  No results found.   PATHOLOGY:    ASSESSMENT AND PLAN:  Stage IV adenocarcinoma, pancoast tumor, of right lung.  PLAN:  Tolerating opdivo well. Continue with Nivolumab every 2 weeks and Xgeva every 4 weeks.  She will get opdivo today. Will transition to Nivolumab every 4 weeks once  approved. Plan to restage with scans in the end of July, which will be 3 months from her last scans. I have ordered her scans today.  RTC in 4 weeks for follow up.  All questions were answered. The patient knows to call the clinic with any problems, questions or concerns. We can certainly see the patient much sooner if necessary.   This  note is electronically signed by: Twana First, MD 05/17/2017 10:12 AM

## 2017-05-17 NOTE — Progress Notes (Signed)
Leonard Downing tolerated Opdivo infusion well without complaints or incident. Labs reviewed with Dr. Talbert Cage prior to administering this medication. VSS upon discharge. Pt discharged self ambulatory in satisfactory condition accompanied by family member

## 2017-05-17 NOTE — Patient Instructions (Signed)
[facility] Discharge Instructions for Patients Receiving Chemotherapy   Beginning January 23rd 2017 lab work for the [facility] will be done in the  Main lab at [facility] on 1st floor. If you have a lab appointment with the [place] please come in thru the  Main Entrance and check in at the main information desk   Today you received the following chemotherapy agents Opdivo. Follow-up as scheduled. Call clinic for any questions or concerns  To help prevent nausea and vomiting after your treatment, we encourage you to take your nausea medication   If you develop nausea and vomiting, or diarrhea that is not controlled by your medication, call the clinic.  The clinic phone number is [phone]. Office hours are Monday-Friday 8:30am-5:00pm.  BELOW ARE SYMPTOMS THAT SHOULD BE REPORTED IMMEDIATELY:  *FEVER GREATER THAN 101.0 F  *CHILLS WITH OR WITHOUT FEVER  NAUSEA AND VOMITING THAT IS NOT CONTROLLED WITH YOUR NAUSEA MEDICATION  *UNUSUAL SHORTNESS OF BREATH  *UNUSUAL BRUISING OR BLEEDING  TENDERNESS IN MOUTH AND THROAT WITH OR WITHOUT PRESENCE OF ULCERS  *URINARY PROBLEMS  *BOWEL PROBLEMS  UNUSUAL RASH Items with * indicate a potential emergency and should be followed up as soon as possible. If you have an emergency after office hours please contact your primary care physician or go to the nearest emergency department.  Please call the clinic during office hours if you have any questions or concerns.   You may also contact the Patient Navigator at [phone] should you have any questions or need assistance in obtaining follow up care.      Resources For Cancer Patients and their Caregivers ? American Cancer Society: Can assist with transportation, wigs, general needs, runs Look Good Feel Better.        316-501-4829 ? Cancer Care: Provides financial assistance, online support groups, medication/co-pay assistance.  1-800-813-HOPE  339-858-3888) ? [place] Assists [place] cancer patients and their families through emotional , educational and financial support.  203-629-3344 ? [place] DSS Where to apply for food stamps, Medicaid and utility assistance. (629)770-2527 ? RCATS: Transportation to medical appointments. 801-817-0924 ? Social Security Administration: May apply for disability if have a Stage IV cancer. 210-512-7714 252-769-8486 ? [employer], Disability and Transit Services: Assists with nutrition, care and transit needs. [phone]

## 2017-05-22 ENCOUNTER — Other Ambulatory Visit (HOSPITAL_COMMUNITY): Payer: Self-pay | Admitting: Oncology

## 2017-05-23 ENCOUNTER — Encounter: Payer: Self-pay | Admitting: Gastroenterology

## 2017-05-23 ENCOUNTER — Ambulatory Visit (INDEPENDENT_AMBULATORY_CARE_PROVIDER_SITE_OTHER): Payer: 59 | Admitting: Gastroenterology

## 2017-05-23 VITALS — BP 126/71 | HR 94 | Temp 98.8°F | Ht 59.0 in | Wt 95.2 lb

## 2017-05-23 DIAGNOSIS — R1310 Dysphagia, unspecified: Secondary | ICD-10-CM

## 2017-05-23 MED ORDER — OMEPRAZOLE 40 MG PO CPDR: 40.0000 mg | DELAYED_RELEASE_CAPSULE | Freq: Every day | ORAL | 3 refills | Status: DC

## 2017-05-23 NOTE — Patient Instructions (Signed)
I have refilled your Protonix to take once each morning, 30 minutes before breakfast.   We will see you in 1 year. Let me know if you have any trouble swallowing!

## 2017-05-23 NOTE — Progress Notes (Signed)
cc'ed to pcp °

## 2017-05-23 NOTE — Assessment & Plan Note (Signed)
61 year old female doing quite well now without issues with dysphagia, on Prilosec 40 mg once daily, last EGD in Jan 2016 s/p dilation of esophageal stricture. Continue Prilosec once daily. Call if any issues. Although she is due for routine screening colonoscopy, she has stage IV adenocarcinoma of the lung and risks of colonoscopy outweigh the benefits at this time. No concerning lower GI symptoms. Return in 1 year.

## 2017-05-23 NOTE — Progress Notes (Signed)
Referring Provider: [name], MD Primary Care Physician:  [name], MD Primary GI: Dr. [name]   Chief Complaint  Patient presents with  . Dysphagia    f/u, doing ok, med refill    HPI:   Melissa Sullivan is a 61 y.o. female presenting today with a history of stage IV adenocarcinoma of the lung, last EGD in Jan 2016 with distal esophageal stricture s/p dilation, mild to moderate erosive gastritis. Negative KOH. Colonoscopy arranged in 2016 but risks outweighed the benefits due to history of advance cancer. She is due for restaging scans end of July. She is here to follow-up for dysphagia, GERD, needs med refill. Followed by oncology closely.   No dysphagia, abdominal pain, GERD exacerbation. Prilosec 40 mg once daily. No rectal bleeding, constipation, diarrhea, change in bowel habits. No GI complaints. Having a cookout tomorrow for the 4th of July.   Past Medical History:  Diagnosis Date  . Adrenal mass, right ([place]) 07/28/2014  . Anemia   . Bone metastases ([place]) 04/05/2016  . Cancer ([place])    lung  right  . Diabetes mellitus without complication ([place])   . GERD (gastroesophageal reflux disease)   . Hyperlipidemia   . Hypertension   . Hypothyroidism due to medication 01/30/2017  . Lung mass 07/28/2014  . Reflux     Past Surgical History:  Procedure Laterality Date  . AMPUTATION Right 02/22/2017   Procedure: PARTIAL AMPUTATION RIGHT GREAT TOE;  Surgeon: Caprice Beaver, DPM;  Location: [facility];  Service: Podiatry;  Laterality: Right;  . APPENDECTOMY    . ESOPHAGOGASTRODUODENOSCOPY N/A 12/09/2014   [id number] esophageal stricture/mild-to-noderate erosive gastritis. negative H.pylori  . FLEXIBLE SIGMOIDOSCOPY  2011   Dr. [name]: hyperplastic polyp  . LUNG BIOPSY Right 07/2014   CT guided  . MALONEY DILATION N/A 12/09/2014   Procedure: [name] DILATION;  Surgeon: [name], MD;  Location: [facility];  Service: Endoscopy;  Laterality: N/A;  . PORTACATH PLACEMENT Left  09/01/2014  . SAVORY DILATION N/A 12/09/2014   Procedure: SAVORY DILATION;  Surgeon: [name], MD;  Location: [facility];  Service: Endoscopy;  Laterality: N/A;    Current Outpatient Prescriptions  Medication Sig Dispense Refill  . Calcium Carb-Cholecalciferol (CALCIUM 1000 + D PO) Take 1 tablet by mouth daily.    . Fluticasone-Salmeterol (ADVAIR DISKUS) 500-50 MCG/DOSE AEPB Inhale 1 puff into the lungs 2 (two) times daily. (Patient taking differently: Inhale 1 puff into the lungs 2 (two) times daily as needed (for respiratory issues.). ) 60 each 6  . glucose blood test strip Use as instructed (Patient taking differently: 1 each by Other route every other day. Use as instructed) 100 each 12  . ibuprofen (ADVIL,MOTRIN) 200 MG tablet Take 400 mg by mouth every 8 (eight) hours as needed (for pain.).    Marland Kitchen KLOR-CON M20 20 MEQ tablet TAKE 1 TABLET BY MOUTH TWICE DAILY 60 tablet 3  . levothyroxine (SYNTHROID, LEVOTHROID) 25 MCG tablet Take 0.5 tablets (12.5 mcg total) by mouth daily before breakfast. 45 tablet 1  . lidocaine-prilocaine (EMLA) cream Apply a quarter size amount to port site 1 hour prior to chemo. Do not rub in. Cover with plastic wrap. 30 g 5  . magic mouthwash SOLN Take 5 mLs by mouth 4 (four) times daily as needed for mouth pain. 240 mL 2  . megestrol (MEGACE) 40 MG/ML suspension Take 400 mg by mouth daily.     . metoCLOPramide (REGLAN) 10 MG  tablet TAKE ONE TABLET BY MOUTH 4 TIMES DAILY 120 tablet 0  . Nivolumab (OPDIVO IV) Inject 240 mg into the vein every 14 (fourteen) days.     Marland Kitchen omeprazole (PRILOSEC) 40 MG capsule Take 1 capsule (40 mg total) by mouth daily. 90 capsule 3  . ondansetron (ZOFRAN) 8 MG tablet Take 1 tablet (8 mg total) by mouth every 8 (eight) hours as needed for nausea or vomiting. 60 tablet 1  . ONE TOUCH LANCETS MISC Use as directed (Patient taking differently: 1 each by Other route every other day. Use as directed) 200 each 6  . Oxycodone HCl 10 MG TABS  Take 1-2 tablets (10-20 mg total) by mouth every 6 (six) hours as needed (pain.). 100 tablet 0  . senna (SENOKOT) 8.6 MG tablet Take 2 tablets by mouth at bedtime as needed for constipation. Reported on 02/24/2016    . simvastatin (ZOCOR) 40 MG tablet Take 1 tablet (40 mg total) by mouth every morning. 30 tablet 6  . triamcinolone (KENALOG) 0.025 % cream Apply 1 application topically 2 (two) times daily as needed (for dry/itching skin.). Reported on 02/24/2016     No current facility-administered medications for this visit.    Facility-Administered Medications Ordered in Other Visits  Medication Dose Route Frequency Provider Last Rate Last Dose  . sodium chloride 0.9 % injection 10 mL  10 mL Intracatheter PRN Twana First, MD   10 mL at 04/05/17 1050    Allergies as of 05/23/2017  . (No Known Allergies)    Family History  Problem Relation Age of Onset  . Cancer Sister   . Colon cancer Neg Hx     Social History   Social History  . Marital status: Married    Spouse name: N/A  . Number of children: N/A  . Years of education: N/A   Social History Main Topics  . Smoking status: Light Tobacco Smoker    Packs/day: 0.30    Years: 20.00    Types: Cigarettes  . Smokeless tobacco: Never Used  . Alcohol use No  . Drug use: No  . Sexual activity: Not on file   Other Topics Concern  . Not on file   Social History Narrative  . No narrative on file    Review of Systems: As mentioned in HPI   Physical Exam: BP 126/71   Pulse 94   Temp 98.8 F (37.1 C) (Oral)   Ht 4\' 11"  (1.499 m)   Wt 95 lb 3.2 oz (43.2 kg)   BMI 19.23 kg/m  General:   Alert and oriented. No distress noted. Pleasant and cooperative.  Head:  Normocephalic and atraumatic. Eyes:  Conjuctiva clear without scleral icterus. Mouth:  Oral mucosa pink and moist. Good dentition. No lesions. Heart:  S1, S2 present without murmurs, Abdomen:  +BS, soft, non-tender and non-distended. No rebound or guarding. No HSM or  masses noted. Msk:  Symmetrical without gross deformities. Normal posture. Extremities:  Without edema. Neurologic:  Alert and  oriented x4 Psych:  Alert and cooperative. Normal mood and affect.

## 2017-05-28 NOTE — Progress Notes (Signed)
REVIEWED-NO ADDITIONAL RECOMMENDATIONS. 

## 2017-05-31 ENCOUNTER — Encounter (HOSPITAL_COMMUNITY): Payer: Self-pay

## 2017-05-31 ENCOUNTER — Encounter (HOSPITAL_COMMUNITY): Payer: 59

## 2017-05-31 ENCOUNTER — Encounter (HOSPITAL_COMMUNITY): Payer: 59 | Attending: Oncology

## 2017-05-31 VITALS — BP 131/75 | HR 85 | Temp 98.7°F | Resp 20 | Wt 96.2 lb

## 2017-05-31 DIAGNOSIS — Z923 Personal history of irradiation: Secondary | ICD-10-CM | POA: Diagnosis not present

## 2017-05-31 DIAGNOSIS — C7972 Secondary malignant neoplasm of left adrenal gland: Secondary | ICD-10-CM | POA: Diagnosis not present

## 2017-05-31 DIAGNOSIS — Z89411 Acquired absence of right great toe: Secondary | ICD-10-CM | POA: Insufficient documentation

## 2017-05-31 DIAGNOSIS — Z809 Family history of malignant neoplasm, unspecified: Secondary | ICD-10-CM | POA: Diagnosis not present

## 2017-05-31 DIAGNOSIS — Z9221 Personal history of antineoplastic chemotherapy: Secondary | ICD-10-CM | POA: Diagnosis not present

## 2017-05-31 DIAGNOSIS — C7971 Secondary malignant neoplasm of right adrenal gland: Secondary | ICD-10-CM | POA: Diagnosis not present

## 2017-05-31 DIAGNOSIS — F17210 Nicotine dependence, cigarettes, uncomplicated: Secondary | ICD-10-CM | POA: Insufficient documentation

## 2017-05-31 DIAGNOSIS — I10 Essential (primary) hypertension: Secondary | ICD-10-CM | POA: Insufficient documentation

## 2017-05-31 DIAGNOSIS — K219 Gastro-esophageal reflux disease without esophagitis: Secondary | ICD-10-CM | POA: Insufficient documentation

## 2017-05-31 DIAGNOSIS — C7951 Secondary malignant neoplasm of bone: Secondary | ICD-10-CM | POA: Diagnosis not present

## 2017-05-31 DIAGNOSIS — Z9889 Other specified postprocedural states: Secondary | ICD-10-CM | POA: Diagnosis not present

## 2017-05-31 DIAGNOSIS — Z5112 Encounter for antineoplastic immunotherapy: Secondary | ICD-10-CM

## 2017-05-31 DIAGNOSIS — C3411 Malignant neoplasm of upper lobe, right bronchus or lung: Secondary | ICD-10-CM | POA: Insufficient documentation

## 2017-05-31 DIAGNOSIS — E119 Type 2 diabetes mellitus without complications: Secondary | ICD-10-CM | POA: Diagnosis not present

## 2017-05-31 DIAGNOSIS — E278 Other specified disorders of adrenal gland: Secondary | ICD-10-CM

## 2017-05-31 DIAGNOSIS — E279 Disorder of adrenal gland, unspecified: Secondary | ICD-10-CM | POA: Diagnosis present

## 2017-05-31 DIAGNOSIS — E032 Hypothyroidism due to medicaments and other exogenous substances: Secondary | ICD-10-CM

## 2017-05-31 LAB — CBC WITH DIFFERENTIAL/PLATELET
Basophils Absolute: 0 10*3/uL (ref 0.0–0.1)
Basophils Relative: 0 %
EOS ABS: 0.1 10*3/uL (ref 0.0–0.7)
EOS PCT: 1 %
HCT: 36.2 % (ref 36.0–46.0)
Hemoglobin: 12 g/dL (ref 12.0–15.0)
LYMPHS ABS: 3 10*3/uL (ref 0.7–4.0)
Lymphocytes Relative: 27 %
MCH: 29.6 pg (ref 26.0–34.0)
MCHC: 33.1 g/dL (ref 30.0–36.0)
MCV: 89.2 fL (ref 78.0–100.0)
MONO ABS: 1 10*3/uL (ref 0.1–1.0)
MONOS PCT: 9 %
Neutro Abs: 7 10*3/uL (ref 1.7–7.7)
Neutrophils Relative %: 63 %
PLATELETS: 304 10*3/uL (ref 150–400)
RBC: 4.06 MIL/uL (ref 3.87–5.11)
RDW: 15.8 % — AB (ref 11.5–15.5)
WBC: 11.1 10*3/uL — ABNORMAL HIGH (ref 4.0–10.5)

## 2017-05-31 LAB — TSH: TSH: 1.87 u[IU]/mL (ref 0.350–4.500)

## 2017-05-31 LAB — COMPREHENSIVE METABOLIC PANEL
ALBUMIN: 4 g/dL (ref 3.5–5.0)
ALK PHOS: 61 U/L (ref 38–126)
ALT: 12 U/L — AB (ref 14–54)
AST: 15 U/L (ref 15–41)
Anion gap: 7 (ref 5–15)
BUN: 19 mg/dL (ref 6–20)
CALCIUM: 10.3 mg/dL (ref 8.9–10.3)
CO2: 23 mmol/L (ref 22–32)
CREATININE: 1.46 mg/dL — AB (ref 0.44–1.00)
Chloride: 104 mmol/L (ref 101–111)
GFR calc Af Amer: 44 mL/min — ABNORMAL LOW (ref >60)
GFR calc non Af Amer: 38 mL/min — ABNORMAL LOW (ref >60)
GLUCOSE: 110 mg/dL — AB (ref 65–99)
Potassium: 4.2 mmol/L (ref 3.5–5.1)
SODIUM: 134 mmol/L — AB (ref 135–145)
Total Bilirubin: 0.4 mg/dL (ref 0.3–1.2)
Total Protein: 7.8 g/dL (ref 6.5–8.1)

## 2017-05-31 MED ORDER — SODIUM CHLORIDE 0.9 % IV SOLN
Freq: Once | INTRAVENOUS | Status: AC
  Administered 2017-05-31: 11:00:00 via INTRAVENOUS

## 2017-05-31 MED ORDER — SODIUM CHLORIDE 0.9 % IV SOLN
240.0000 mg | Freq: Once | INTRAVENOUS | Status: AC
  Administered 2017-05-31: 240 mg via INTRAVENOUS
  Filled 2017-05-31: qty 24

## 2017-05-31 MED ORDER — OXYCODONE HCL 10 MG PO TABS: 10.0000 mg | ORAL_TABLET | Freq: Four times a day (QID) | ORAL | 0 refills | Status: DC | PRN

## 2017-05-31 MED ORDER — DENOSUMAB 120 MG/1.7ML ~~LOC~~ SOLN
120.0000 mg | Freq: Once | SUBCUTANEOUS | Status: AC
  Administered 2017-05-31: 120 mg via SUBCUTANEOUS
  Filled 2017-05-31: qty 1.7

## 2017-05-31 MED ORDER — SODIUM CHLORIDE 0.9 % IJ SOLN
10.0000 mL | INTRAMUSCULAR | Status: DC | PRN
  Administered 2017-05-31: 10 mL
  Filled 2017-05-31: qty 10

## 2017-05-31 MED ORDER — HEPARIN SOD (PORK) LOCK FLUSH 100 UNIT/ML IV SOLN
500.0000 [IU] | Freq: Once | INTRAVENOUS | Status: AC | PRN
  Administered 2017-05-31: 500 [IU]

## 2017-05-31 MED ORDER — HEPARIN SOD (PORK) LOCK FLUSH 100 UNIT/ML IV SOLN
INTRAVENOUS | Status: AC
  Filled 2017-05-31: qty 5

## 2017-05-31 NOTE — Patient Instructions (Signed)
[facility] Discharge Instructions for Patients Receiving Chemotherapy   Beginning January 23rd 2017 lab work for the [facility] will be done in the  Main lab at [facility] on 1st floor. If you have a lab appointment with the [facility] please come in thru the  Main Entrance and check in at the main information desk   Today you received the following chemotherapy agents Opdivo as well as Xgeva injection. Follow-up as scheduled. Call clinic for any questions or concerns  To help prevent nausea and vomiting after your treatment, we encourage you to take your nausea medication   If you develop nausea and vomiting, or diarrhea that is not controlled by your medication, call the clinic.  The clinic phone number is [phone]. Office hours are Monday-Friday 8:30am-5:00pm.  BELOW ARE SYMPTOMS THAT SHOULD BE REPORTED IMMEDIATELY:  *FEVER GREATER THAN 101.0 F  *CHILLS WITH OR WITHOUT FEVER  NAUSEA AND VOMITING THAT IS NOT CONTROLLED WITH YOUR NAUSEA MEDICATION  *UNUSUAL SHORTNESS OF BREATH  *UNUSUAL BRUISING OR BLEEDING  TENDERNESS IN MOUTH AND THROAT WITH OR WITHOUT PRESENCE OF ULCERS  *URINARY PROBLEMS  *BOWEL PROBLEMS  UNUSUAL RASH Items with * indicate a potential emergency and should be followed up as soon as possible. If you have an emergency after office hours please contact your primary care physician or go to the nearest emergency department.  Please call the clinic during office hours if you have any questions or concerns.   You may also contact the Patient Navigator at [phone] should you have any questions or need assistance in obtaining follow up care.      Resources For Cancer Patients and their Caregivers ? American Cancer Society: Can assist with transportation, wigs, general needs, runs Look Good Feel Better.        [phone] ? Cancer Care: Provides financial assistance, online support groups, medication/co-pay assistance.   1-800-813-HOPE 417 684 5179) ? [employer] Assists [place] cancer patients and their families through emotional , educational and financial support.  [phone] ? [place] DSS Where to apply for food stamps, Medicaid and utility assistance. 415-712-9481 ? [employer]: Transportation to medical appointments. [phone] ? Social Security Administration: May apply for disability if have a Stage IV cancer. [phone] ? [employer], Disability and Transit Services: Assists with nutrition, care and transit needs. [phone]

## 2017-05-31 NOTE — Progress Notes (Signed)
Melissa Sullivan tolerated Opdivo infusion and Xgeva injection well without complaints or incident. Labs reviewed with Dr. Talbert Cage prior to administering these medications. Calcium 10.3. Pt denied any tooth,jaw or leg pain as well as no recent dental visits.Pt confirms taking her Calcium as prescribed. VSS upon discharge. Pt discharged self ambulatory in satisfactory condition accompanied by family members

## 2017-06-12 ENCOUNTER — Ambulatory Visit (HOSPITAL_COMMUNITY)
Admission: RE | Admit: 2017-06-12 | Discharge: 2017-06-12 | Disposition: A | Discharge status: Home / Self Care | Payer: 59 | Source: Ambulatory Visit | Attending: Oncology | Admitting: Oncology

## 2017-06-12 DIAGNOSIS — C3411 Malignant neoplasm of upper lobe, right bronchus or lung: Secondary | ICD-10-CM | POA: Diagnosis present

## 2017-06-12 DIAGNOSIS — I2510 Atherosclerotic heart disease of native coronary artery without angina pectoris: Secondary | ICD-10-CM | POA: Diagnosis not present

## 2017-06-12 DIAGNOSIS — I700 Atherosclerosis of aorta: Secondary | ICD-10-CM | POA: Diagnosis not present

## 2017-06-12 DIAGNOSIS — C7951 Secondary malignant neoplasm of bone: Secondary | ICD-10-CM | POA: Diagnosis not present

## 2017-06-12 DIAGNOSIS — C7971 Secondary malignant neoplasm of right adrenal gland: Secondary | ICD-10-CM | POA: Diagnosis not present

## 2017-06-12 DIAGNOSIS — J439 Emphysema, unspecified: Secondary | ICD-10-CM | POA: Insufficient documentation

## 2017-06-12 MED ORDER — IOPAMIDOL (ISOVUE-300) INJECTION 61%
80.0000 mL | Freq: Once | INTRAVENOUS | Status: AC | PRN
  Administered 2017-06-12: 80 mL via INTRAVENOUS

## 2017-06-14 ENCOUNTER — Encounter (HOSPITAL_COMMUNITY): Payer: 59

## 2017-06-14 ENCOUNTER — Encounter (HOSPITAL_COMMUNITY): Payer: Self-pay | Admitting: Oncology

## 2017-06-14 ENCOUNTER — Encounter (HOSPITAL_BASED_OUTPATIENT_CLINIC_OR_DEPARTMENT_OTHER): Payer: 59 | Admitting: Oncology

## 2017-06-14 VITALS — BP 104/58 | HR 81 | Temp 98.8°F | Resp 18 | Wt 95.3 lb

## 2017-06-14 DIAGNOSIS — C3411 Malignant neoplasm of upper lobe, right bronchus or lung: Secondary | ICD-10-CM

## 2017-06-14 DIAGNOSIS — Z5112 Encounter for antineoplastic immunotherapy: Secondary | ICD-10-CM

## 2017-06-14 DIAGNOSIS — E032 Hypothyroidism due to medicaments and other exogenous substances: Secondary | ICD-10-CM

## 2017-06-14 DIAGNOSIS — E278 Other specified disorders of adrenal gland: Secondary | ICD-10-CM

## 2017-06-14 LAB — COMPREHENSIVE METABOLIC PANEL
ALK PHOS: 58 U/L (ref 38–126)
ALT: 10 U/L — AB (ref 14–54)
AST: 18 U/L (ref 15–41)
Albumin: 4.1 g/dL (ref 3.5–5.0)
Anion gap: 8 (ref 5–15)
BILIRUBIN TOTAL: 0.4 mg/dL (ref 0.3–1.2)
BUN: 18 mg/dL (ref 6–20)
CALCIUM: 10.2 mg/dL (ref 8.9–10.3)
CO2: 23 mmol/L (ref 22–32)
CREATININE: 1.4 mg/dL — AB (ref 0.44–1.00)
Chloride: 107 mmol/L (ref 101–111)
GFR, EST AFRICAN AMERICAN: 46 mL/min — AB (ref >60)
GFR, EST NON AFRICAN AMERICAN: 40 mL/min — AB (ref >60)
Glucose, Bld: 120 mg/dL — ABNORMAL HIGH (ref 65–99)
Potassium: 4.9 mmol/L (ref 3.5–5.1)
Sodium: 138 mmol/L (ref 135–145)
Total Protein: 7.9 g/dL (ref 6.5–8.1)

## 2017-06-14 LAB — CBC WITH DIFFERENTIAL/PLATELET
Basophils Absolute: 0 10*3/uL (ref 0.0–0.1)
Basophils Relative: 0 %
EOS PCT: 2 %
Eosinophils Absolute: 0.2 10*3/uL (ref 0.0–0.7)
HEMATOCRIT: 36.1 % (ref 36.0–46.0)
HEMOGLOBIN: 12 g/dL (ref 12.0–15.0)
LYMPHS ABS: 2.7 10*3/uL (ref 0.7–4.0)
LYMPHS PCT: 26 %
MCH: 29.9 pg (ref 26.0–34.0)
MCHC: 33.2 g/dL (ref 30.0–36.0)
MCV: 90 fL (ref 78.0–100.0)
Monocytes Absolute: 0.9 10*3/uL (ref 0.1–1.0)
Monocytes Relative: 9 %
NEUTROS ABS: 6.4 10*3/uL (ref 1.7–7.7)
NEUTROS PCT: 63 %
Platelets: 289 10*3/uL (ref 150–400)
RBC: 4.01 MIL/uL (ref 3.87–5.11)
RDW: 16.8 % — ABNORMAL HIGH (ref 11.5–15.5)
WBC: 10.2 10*3/uL (ref 4.0–10.5)

## 2017-06-14 LAB — TSH: TSH: 1.208 u[IU]/mL (ref 0.350–4.500)

## 2017-06-14 MED ORDER — SODIUM CHLORIDE 0.9 % IV SOLN
Freq: Once | INTRAVENOUS | Status: AC
  Administered 2017-06-14: 12:00:00 via INTRAVENOUS

## 2017-06-14 MED ORDER — SODIUM CHLORIDE 0.9 % IV SOLN
240.0000 mg | Freq: Once | INTRAVENOUS | Status: AC
  Administered 2017-06-14: 240 mg via INTRAVENOUS
  Filled 2017-06-14: qty 24

## 2017-06-14 MED ORDER — HEPARIN SOD (PORK) LOCK FLUSH 100 UNIT/ML IV SOLN
500.0000 [IU] | Freq: Once | INTRAVENOUS | Status: AC | PRN
Start: 2017-06-14 — End: 2017-06-14
  Administered 2017-06-14: 500 [IU]

## 2017-06-14 MED ORDER — HEPARIN SOD (PORK) LOCK FLUSH 100 UNIT/ML IV SOLN
INTRAVENOUS | Status: AC
  Filled 2017-06-14: qty 5

## 2017-06-14 NOTE — Progress Notes (Signed)
Tolerated infusion w/o adverse reaction.  Alert, in no distress.  Discharged ambulatory.  

## 2017-06-14 NOTE — Patient Instructions (Signed)
[place] at [facility] Discharge Instructions  RECOMMENDATIONS MADE BY THE CONSULTANT AND ANY TEST RESULTS WILL BE SENT TO YOUR REFERRING PHYSICIAN.  Seen by Dr Talbert Cage today / Return in about 4 weeks ( around 07/12/2017 )  RTC in 2 weeks for Opdivo / Hold August dose of Delton See because pt is getting dental work Thank you for choosing [place] at [facility] to provide your oncology and hematology care.  To afford each patient quality time with our provider, please arrive at least 15 minutes before your scheduled appointment time.    If you have a lab appointment with the [place] please come in thru the  Main Entrance and check in at the main information desk  You need to re-schedule your appointment should you arrive 10 or more minutes late.  We strive to give you quality time with our providers, and arriving late affects you and other patients whose appointments are after yours.  Also, if you no show three or more times for appointments you may be dismissed from the clinic at the providers discretion.     Again, thank you for choosing [facility].  Our hope is that these requests will decrease the amount of time that you wait before being seen by our physicians.       _____________________________________________________________  Should you have questions after your visit to [facility], please contact our office at [phone] between the hours of 8:30 a.m. and 4:30 p.m.  Voicemails left after 4:30 p.m. will not be returned until the following business day.  For prescription refill requests, have your pharmacy contact our office.       Resources For Cancer Patients and their Caregivers ? American Cancer Society: Can assist with transportation, wigs, general needs, runs Look Good Feel Better.        715-086-3731 ? Cancer Care: Provides financial assistance, online support groups, medication/co-pay  assistance.  1-800-813-HOPE 458-457-6996) ? [place] Assists [place] cancer patients and their families through emotional , educational and financial support.  438-011-8624 ? [place] DSS Where to apply for food stamps, Medicaid and utility assistance. ([phone] ? RCATS: Transportation to medical appointments. 301-220-6916 ? Social Security Administration: May apply for disability if have a Stage IV cancer. 607-768-6454 ([phone] ? [employer], Disability and Transit Services: Assists with nutrition, care and transit needs. [place] Support Programs: @10RELATIVEDAYS @ > Cancer Support Group  2nd Tuesday of the month 1pm-2pm, Journey Room  > Creative Journey  3rd Tuesday of the month 1130am-1pm, Journey Room  > Look Good Feel Better  1st Wednesday of the month 10am-12 noon, Journey Room (Call [place] to register [phone])

## 2017-06-14 NOTE — Progress Notes (Signed)
[name], MD [place] [address]  CURRENT THERAPY:Nivolumab every 2 weeks.  Xgeva every 4 weeks.  INTERVAL HISTORY: Melissa Sullivan 61 y.o. female returns for followup of Stage IV adenocarcinoma, pancoast tumor, of right lung.  (EGFR and ALK negative) complicated by right brachial plexus neuralgia with disease on presentation including bilateral adrenal metastases, retroperitoneal lymph nodes, and osseous involvement.    Cancer of upper lobe of right lung ([place])   07/28/2014 Imaging    CT chest: Large R apical mass consistent with malignancy. This is destroying the R 2nd rib with extension into adjacent soft tissue. R hilar adenopathy with R 5cm adrenal metastatic lesion.      08/01/2014 Initial Biopsy    Lung, needle/core biopsy(ies), right upper lobe - POORLY DIFFERENTIATED ADENOCARCINOMA, SEE COMMENT.      08/08/2014 PET scan    Large hypermetabolic R apical mass with evidence of direct chest wall and mediastinal invasion, right retrocrural lymphadenopathy, extensive retroperitoneal lymphadenopathy, and metastatic lesions to the adrenal glands       09/02/2014 - 11/04/2014 Chemotherapy    Cisplatin/Pemetrexed/Avastin every 21 days x 4 cycles      10/07/2014 - 10/27/2014 Radiation Therapy    Right lung apex for control of brachioplexopathy.      12/24/2014 - 02/25/2015 Chemotherapy    Alimta/Avastin every 21 days.      02/20/2015 Imaging    Increase in size of right adrenal metastasis and subjacent confluent retrocaval lymphadenopathy      02/25/2015 -  Chemotherapy    Nivolumab, zometa      05/04/2015 Imaging    CT CAP- Stable to slight decrease in the posterior right apical lesion. Stable appearance of posterior right upper rib an upper thoracic bony lesions. Slight improvement in right upper lobe tree-in-bud opacity. No new or progressive findings in...      07/28/2015 Imaging    CT CAP- Reduced size of the right apical pleural parenchymal  lesion and reduced size of the right adrenal metastatic lesion. Resolution of prior retrocrural adenopathy.  Right eccentric T1 and T2 sclerosis with sclerosis and tapering of the right second..      11/17/2015 Imaging    CT CAP- Stable soft tissue thickening in the apex of the right hemi thorax. Stable right adrenal metastasis. Nodularity along the trachea and mainstem bronchi, relatively new from 07/28/2015, favoring adherent debris.      11/18/2015 Treatment Plan Change    Zometa HELD for upcoming tooth extraction      11/24/2015 Treatment Plan Change    Zometa on hold at this time in preparation for tooth extraction in March 2017.  Zometa las given on 11/18/2015.      02/03/2016 Imaging    CT CAP- Heterogeneous right apical masslike consolidation and right adrenal metastasis are unchanged      04/06/2016 Treatment Plan Change    Zometa restarted 6 weeks out from tooth extraction (04/06/2016)      05/12/2016 Imaging    CT CAP- NED in the chest, abdomen or pelvis.  Some areas of nodularity associated with the mainstem bronchi in the left upper lobe bronchus, favored to represent adherent inspissated secretions      08/17/2016 Imaging    CT CAP- 1. Stable CTs of the chest and abdomen. No evidence of progressive metastatic disease. 2. Probable treated tumor at the right apex, right adrenal gland and T2 vertebral body, stable. 3. Fluctuating nodularity along the walls  of the trachea and mainstem bronchi, likely secretions.      12/12/2016 Imaging    Further decrease in size of treated tumor within the right apex. 2. Stable treated tumor involving the right second rib and T2 vertebra. 3. Stable right adrenal gland treated tumor. 4. Emphysema 5. Aortic atherosclerosis      01/04/2017 Imaging    MRI brain- Normal brain MRI.  No intracranial metastatic disease.      03/15/2017 Imaging    CT CAP- 1. No new or progressive metastatic disease in the chest or abdomen. 2. Stable treated  tumor in the apical right upper lobe. Stable treated right posterior second rib and right T2 vertebral lesions. Stable treated right adrenal metastasis. 3. Aortic atherosclerosis. 4. Moderate emphysema with mild diffuse bronchial wall thickening, suggesting COPD.      06/12/2017 Imaging    CT CAP 1. Similar appearance of treated primary within the right apex. 2. Similar areas of sclerosis within the right second rib, T2, and less so T1 vertebral bodies. These are most consistent with treated metastasis. 3. Similar right adrenal treated metastasis. 4. No evidence of new or progressive disease. 5. Similar right and progressive left areas of bronchial wall thickening and probable mucoid impaction. Correlate with interval infectious symptoms. 6.  Emphysema (ICD10-J43.9). 7. Coronary artery atherosclerosis. Aortic Atherosclerosis (ICD10-I70.0).         Patient presented for follow up today. Overall she is doing well. She states she has been eating a lot more and her appetite has improved. She denies any chest pain, shortness of breath, abdominal pain, focal weakness.  Otherwise she is tolerating opdivo well without any side effects.  Review of Systems  Constitutional: Negative.  Negative for chills, fever and weight loss.  HENT: Negative.  Negative for hearing loss, sore throat and tinnitus.   Eyes: Negative.  Negative for blurred vision, photophobia and discharge.  Respiratory: Negative.  Negative for cough, hemoptysis, shortness of breath and wheezing.   Cardiovascular: Negative.  Negative for chest pain, palpitations, orthopnea, claudication and leg swelling.  Gastrointestinal: Negative.  Negative for abdominal pain, blood in stool, constipation, diarrhea, melena, nausea and vomiting.  Genitourinary: Negative.  Negative for dysuria and hematuria.  Musculoskeletal: Negative.  Negative for back pain, joint pain and myalgias.  Skin: Negative.  Negative for itching and rash.    Neurological: Negative.  Negative for dizziness, weakness and headaches.  Endo/Heme/Allergies: Negative.  Negative for environmental allergies and polydipsia. Does not bruise/bleed easily.  Psychiatric/Behavioral: Negative for depression. The patient is not nervous/anxious and does not have insomnia.     Past Medical History:  Diagnosis Date  . Adrenal mass, right ([place]) 07/28/2014  . Anemia   . Bone metastases ([place]) 04/05/2016  . Cancer ([place])    lung  right  . Diabetes mellitus without complication ([place])   . GERD (gastroesophageal reflux disease)   . Hyperlipidemia   . Hypertension   . Hypothyroidism due to medication 01/30/2017  . Lung mass 07/28/2014  . Reflux     Past Surgical History:  Procedure Laterality Date  . AMPUTATION Right 02/22/2017   Procedure: PARTIAL AMPUTATION RIGHT GREAT TOE;  Surgeon: Caprice Beaver, DPM;  Location: [facility] ORS;  Service: Podiatry;  Laterality: Right;  . APPENDECTOMY    . ESOPHAGOGASTRODUODENOSCOPY N/A 12/09/2014   AJO:INOMVE esophageal stricture/mild-to-noderate erosive gastritis. negative H.pylori  . FLEXIBLE SIGMOIDOSCOPY  2011   Dr. [name]: hyperplastic polyp  . LUNG BIOPSY Right 07/2014   CT guided  . MALONEY DILATION N/A  12/09/2014   Procedure: [name] DILATION;  Surgeon: [name], MD;  Location: [facility] END[facility] SUITE;  Service: Endoscopy;  Laterality: N/A;  . PORTACATH PLACEMENT Left 09/01/2014  . SAVORY DILATION N/A 12/09/2014   Procedure: SAVORY DILATION;  Surgeon: [name], MD;  Location: [facility] END[facility] SUITE;  Service: Endoscopy;  Laterality: N/A;    Family History  Problem Relation Age of Onset  . Cancer Sister   . Colon cancer Neg Hx     Social History   Social History  . Marital status: Married    Spouse name: N/A  . Number of children: N/A  . Years of education: N/A   Social History Main Topics  . Smoking status: Light Tobacco Smoker    Packs/day: 0.30    Years: 20.00    Types: Cigarettes  . Smokeless tobacco: Never Used   . Alcohol use No  . Drug use: No  . Sexual activity: Not Asked   Other Topics Concern  . None   Social History Narrative  . None     PHYSICAL EXAMINATION  ECOG PERFORMANCE STATUS: 0 - Asymptomatic   Physical Exam  Constitutional: She is oriented to person, place, and time and well-developed, well-nourished, and in no distress. No distress.  HENT:  Head: Normocephalic and atraumatic.  Mouth/Throat: No oropharyngeal exudate.  Eyes: Pupils are equal, round, and reactive to light. Conjunctivae are normal. No scleral icterus.  Neck: Normal range of motion. Neck supple. No JVD present.  Cardiovascular: Normal rate, regular rhythm and normal heart sounds.  Exam reveals no gallop and no friction rub.   No murmur heard. Pulmonary/Chest: Effort normal and breath sounds normal. No respiratory distress. She has no wheezes. She has no rales.  Abdominal: Soft. Bowel sounds are normal. She exhibits no distension. There is no tenderness. There is no guarding.  Musculoskeletal: She exhibits no edema or tenderness.  Lymphadenopathy:    She has no cervical adenopathy.  Neurological: She is alert and oriented to person, place, and time. No cranial nerve deficit.  Skin: Skin is warm and dry. No rash noted. No erythema. No pallor.  Psychiatric: Affect and judgment normal.    LABORATORY DATA: CBC    Component Value Date/Time   WBC 10.2 06/14/2017 1009   RBC 4.01 06/14/2017 1009   HGB 12.0 06/14/2017 1009   HCT 36.1 06/14/2017 1009   PLT 289 06/14/2017 1009   MCV 90.0 06/14/2017 1009   MCH 29.9 06/14/2017 1009   MCHC 33.2 06/14/2017 1009   RDW 16.8 (H) 06/14/2017 1009   LYMPHSABS 2.7 06/14/2017 1009   MONOABS 0.9 06/14/2017 1009   EOSABS 0.2 06/14/2017 1009   BASOSABS 0.0 06/14/2017 1009      Chemistry      Component Value Date/Time   NA 138 06/14/2017 1009   K 4.9 06/14/2017 1009   CL 107 06/14/2017 1009   CO2 23 06/14/2017 1009   BUN 18 06/14/2017 1009   CREATININE 1.40 (H)  06/14/2017 1009      Component Value Date/Time   CALCIUM 10.2 06/14/2017 1009   CALCIUM 9.7 06/03/2015 1000   ALKPHOS 58 06/14/2017 1009   AST 18 06/14/2017 1009   ALT 10 (L) 06/14/2017 1009   BILITOT 0.4 06/14/2017 1009     Lab Results  Component Value Date   TSH 1.208 06/14/2017     PENDING LABS:   RADIOGRAPHIC STUDIES:  Ct Chest W Contrast  Result Date: 06/12/2017 CLINICAL DATA:  Right upper lobe lung cancer with  bone metastasis. Right adrenal mass diagnosed in 2015. Status post chemotherapy and immunotherapy. Hypertension. EXAM: CT CHEST AND ABDOMEN WITH CONTRAST TECHNIQUE: Multidetector CT imaging of the chest and abdomen was performed following the standard protocol during bolus administration of intravenous contrast. CONTRAST:  86m ISOVUE-300 IOPAMIDOL (ISOVUE-300) INJECTION 61% COMPARISON:  03/15/2017 FINDINGS: CT CHEST FINDINGS Cardiovascular: Left-sided Port-A-Cath terminates at the mid SVC. Aortic and branch vessel atherosclerosis. Normal heart size, without pericardial effusion. No central pulmonary embolism, on this non-dedicated study. Mediastinum/Nodes: No supraclavicular adenopathy. No axillary adenopathy. No mediastinal or hilar adenopathy. Lungs/Pleura: No pleural fluid. Similar right apical pleural thickening. Moderate bullous type emphysema. Probable secretions in the trachea and right mainstem bronchus. Treated right apical primary tumor. This measures on the order of 3.5 x 2.8 cm on image 21/series 4. Similar 3.4 x 2.8 cm on the prior. Minimal posterior right upper lobe nodularity is similar, including on image 58/series 4. This may represent an area of mucoid impaction. There is also minimal left apical pulmonary nodularity and bronchial wall thickening, including on image 32/series 4. Slightly increased. More inferior left upper lobe areas of mucoid impaction are new, including on image 71/series 4. Musculoskeletal: Similar appearance of areas of sclerosis involving  the right-side of the T2 vertebral body and posterior right second rib. similar far lateral right T1 sclerosis CT ABDOMEN FINDINGS Hepatobiliary: Focal steatosis adjacent the falciform ligament. Normal gallbladder, without biliary ductal dilatation. Pancreas: Normal, without mass or ductal dilatation. Spleen: Normal in size, without focal abnormality. Adrenals/Urinary Tract: Normal left adrenal gland. Partially calcified right adrenal lesion measures 2.7 x 1.3 cm on image 52/series 2. Compare similar at 2.7 x 1.4 cm on the prior. Normal kidneys, without hydronephrosis. Stomach/Bowel: Normal stomach, without wall thickening. Normal abdominal bowel loops. Vascular/Lymphatic: Advanced aortic and branch vessel atherosclerosis. No retroperitoneal or retrocrural adenopathy. Other: No ascites.  No evidence of omental or peritoneal disease. Musculoskeletal: No acute osseous abnormality. IMPRESSION: 1. Similar appearance of treated primary within the right apex. 2. Similar areas of sclerosis within the right second rib, T2, and less so T1 vertebral bodies. These are most consistent with treated metastasis. 3. Similar right adrenal treated metastasis. 4. No evidence of new or progressive disease. 5. Similar right and progressive left areas of bronchial wall thickening and probable mucoid impaction. Correlate with interval infectious symptoms. 6.  Emphysema (ICD10-J43.9). 7. Coronary artery atherosclerosis. Aortic Atherosclerosis (ICD10-I70.0). Electronically Signed   By: KAbigail MiyamotoM.D.   On: 06/12/2017 12:44   Ct Abdomen W Contrast  Result Date: 06/12/2017 CLINICAL DATA:  Right upper lobe lung cancer with bone metastasis. Right adrenal mass diagnosed in 2015. Status post chemotherapy and immunotherapy. Hypertension. EXAM: CT CHEST AND ABDOMEN WITH CONTRAST TECHNIQUE: Multidetector CT imaging of the chest and abdomen was performed following the standard protocol during bolus administration of intravenous contrast.  CONTRAST:  858mISOVUE-300 IOPAMIDOL (ISOVUE-300) INJECTION 61% COMPARISON:  03/15/2017 FINDINGS: CT CHEST FINDINGS Cardiovascular: Left-sided Port-A-Cath terminates at the mid SVC. Aortic and branch vessel atherosclerosis. Normal heart size, without pericardial effusion. No central pulmonary embolism, on this non-dedicated study. Mediastinum/Nodes: No supraclavicular adenopathy. No axillary adenopathy. No mediastinal or hilar adenopathy. Lungs/Pleura: No pleural fluid. Similar right apical pleural thickening. Moderate bullous type emphysema. Probable secretions in the trachea and right mainstem bronchus. Treated right apical primary tumor. This measures on the order of 3.5 x 2.8 cm on image 21/series 4. Similar 3.4 x 2.8 cm on the prior. Minimal posterior right upper lobe nodularity is similar, including on image  58/series 4. This may represent an area of mucoid impaction. There is also minimal left apical pulmonary nodularity and bronchial wall thickening, including on image 32/series 4. Slightly increased. More inferior left upper lobe areas of mucoid impaction are new, including on image 71/series 4. Musculoskeletal: Similar appearance of areas of sclerosis involving the right-side of the T2 vertebral body and posterior right second rib. similar far lateral right T1 sclerosis CT ABDOMEN FINDINGS Hepatobiliary: Focal steatosis adjacent the falciform ligament. Normal gallbladder, without biliary ductal dilatation. Pancreas: Normal, without mass or ductal dilatation. Spleen: Normal in size, without focal abnormality. Adrenals/Urinary Tract: Normal left adrenal gland. Partially calcified right adrenal lesion measures 2.7 x 1.3 cm on image 52/series 2. Compare similar at 2.7 x 1.4 cm on the prior. Normal kidneys, without hydronephrosis. Stomach/Bowel: Normal stomach, without wall thickening. Normal abdominal bowel loops. Vascular/Lymphatic: Advanced aortic and branch vessel atherosclerosis. No retroperitoneal or  retrocrural adenopathy. Other: No ascites.  No evidence of omental or peritoneal disease. Musculoskeletal: No acute osseous abnormality. IMPRESSION: 1. Similar appearance of treated primary within the right apex. 2. Similar areas of sclerosis within the right second rib, T2, and less so T1 vertebral bodies. These are most consistent with treated metastasis. 3. Similar right adrenal treated metastasis. 4. No evidence of new or progressive disease. 5. Similar right and progressive left areas of bronchial wall thickening and probable mucoid impaction. Correlate with interval infectious symptoms. 6.  Emphysema (ICD10-J43.9). 7. Coronary artery atherosclerosis. Aortic Atherosclerosis (ICD10-I70.0). Electronically Signed   By: Abigail Miyamoto M.D.   On: 06/12/2017 12:44     PATHOLOGY:    ASSESSMENT AND PLAN:  Stage IV adenocarcinoma, pancoast tumor, of right lung.  PLAN:  Reviewed patient's restaging CT scans in detail with her today. Stable scans, no evidence of recurrence or new metastatic disease.  Labs reviewed, proceed with opdivo today. Tolerating opdivo well. Continue with Nivolumab every 2 weeks and Xgeva every 4 weeks.   Hold next month's xgeva. Patient is going to get dental work done. Resume xgeva in September. RTC in 4 weeks for follow up.  All questions were answered. The patient knows to call the clinic with any problems, questions or concerns. We can certainly see the patient much sooner if necessary.   This note is electronically signed by: Twana First, MD 06/14/2017 11:49 AM

## 2017-06-15 ENCOUNTER — Other Ambulatory Visit (HOSPITAL_COMMUNITY): Payer: Self-pay | Admitting: Oncology

## 2017-06-15 ENCOUNTER — Telehealth (HOSPITAL_COMMUNITY): Payer: Self-pay

## 2017-06-15 DIAGNOSIS — E032 Hypothyroidism due to medicaments and other exogenous substances: Secondary | ICD-10-CM

## 2017-06-15 DIAGNOSIS — C3411 Malignant neoplasm of upper lobe, right bronchus or lung: Secondary | ICD-10-CM

## 2017-06-15 MED ORDER — NUTRITIONAL SUPPLEMENT PO LIQD: ORAL | 6 refills | Status: DC

## 2017-06-15 NOTE — Telephone Encounter (Signed)
Patients family called requesting a prescription for ensure or boost. She stated that insurance would pay for it if she had a prescription. Reviewed with provider. Prescription for nutritional supplements given.

## 2017-06-28 ENCOUNTER — Encounter (HOSPITAL_COMMUNITY): Payer: 59 | Attending: Oncology

## 2017-06-28 ENCOUNTER — Encounter (HOSPITAL_COMMUNITY): Payer: 59

## 2017-06-28 VITALS — BP 105/60 | HR 78 | Temp 98.6°F | Resp 18 | Wt 97.0 lb

## 2017-06-28 DIAGNOSIS — Z5112 Encounter for antineoplastic immunotherapy: Secondary | ICD-10-CM | POA: Diagnosis not present

## 2017-06-28 DIAGNOSIS — C3411 Malignant neoplasm of upper lobe, right bronchus or lung: Secondary | ICD-10-CM

## 2017-06-28 DIAGNOSIS — E032 Hypothyroidism due to medicaments and other exogenous substances: Secondary | ICD-10-CM | POA: Insufficient documentation

## 2017-06-28 DIAGNOSIS — C7972 Secondary malignant neoplasm of left adrenal gland: Secondary | ICD-10-CM

## 2017-06-28 DIAGNOSIS — T50905A Adverse effect of unspecified drugs, medicaments and biological substances, initial encounter: Secondary | ICD-10-CM | POA: Diagnosis not present

## 2017-06-28 DIAGNOSIS — C7971 Secondary malignant neoplasm of right adrenal gland: Secondary | ICD-10-CM | POA: Diagnosis not present

## 2017-06-28 DIAGNOSIS — C7951 Secondary malignant neoplasm of bone: Secondary | ICD-10-CM | POA: Diagnosis not present

## 2017-06-28 DIAGNOSIS — E278 Other specified disorders of adrenal gland: Secondary | ICD-10-CM

## 2017-06-28 LAB — COMPREHENSIVE METABOLIC PANEL
ALBUMIN: 3.9 g/dL (ref 3.5–5.0)
ALK PHOS: 60 U/L (ref 38–126)
ALT: 14 U/L (ref 14–54)
AST: 20 U/L (ref 15–41)
Anion gap: 6 (ref 5–15)
BUN: 19 mg/dL (ref 6–20)
CALCIUM: 10.1 mg/dL (ref 8.9–10.3)
CO2: 24 mmol/L (ref 22–32)
CREATININE: 1.5 mg/dL — AB (ref 0.44–1.00)
Chloride: 107 mmol/L (ref 101–111)
GFR calc Af Amer: 43 mL/min — ABNORMAL LOW (ref >60)
GFR calc non Af Amer: 37 mL/min — ABNORMAL LOW (ref >60)
GLUCOSE: 110 mg/dL — AB (ref 65–99)
Potassium: 4.8 mmol/L (ref 3.5–5.1)
SODIUM: 137 mmol/L (ref 135–145)
Total Bilirubin: 0.4 mg/dL (ref 0.3–1.2)
Total Protein: 7.8 g/dL (ref 6.5–8.1)

## 2017-06-28 LAB — CBC WITH DIFFERENTIAL/PLATELET
Basophils Absolute: 0 10*3/uL (ref 0.0–0.1)
Basophils Relative: 0 %
EOS ABS: 0.2 10*3/uL (ref 0.0–0.7)
EOS PCT: 2 %
HCT: 36.4 % (ref 36.0–46.0)
HEMOGLOBIN: 11.8 g/dL — AB (ref 12.0–15.0)
LYMPHS ABS: 2.8 10*3/uL (ref 0.7–4.0)
LYMPHS PCT: 32 %
MCH: 29.5 pg (ref 26.0–34.0)
MCHC: 32.4 g/dL (ref 30.0–36.0)
MCV: 91 fL (ref 78.0–100.0)
Monocytes Absolute: 0.7 10*3/uL (ref 0.1–1.0)
Monocytes Relative: 8 %
NEUTROS PCT: 58 %
Neutro Abs: 5 10*3/uL (ref 1.7–7.7)
Platelets: 269 10*3/uL (ref 150–400)
RBC: 4 MIL/uL (ref 3.87–5.11)
RDW: 17 % — ABNORMAL HIGH (ref 11.5–15.5)
WBC: 8.7 10*3/uL (ref 4.0–10.5)

## 2017-06-28 LAB — TSH: TSH: 2.684 u[IU]/mL (ref 0.350–4.500)

## 2017-06-28 MED ORDER — SODIUM CHLORIDE 0.9 % IV SOLN
Freq: Once | INTRAVENOUS | Status: AC
  Administered 2017-06-28: 12:00:00 via INTRAVENOUS

## 2017-06-28 MED ORDER — HEPARIN SOD (PORK) LOCK FLUSH 100 UNIT/ML IV SOLN
500.0000 [IU] | Freq: Once | INTRAVENOUS | Status: AC | PRN
  Administered 2017-06-28: 500 [IU]

## 2017-06-28 MED ORDER — SODIUM CHLORIDE 0.9 % IV SOLN
240.0000 mg | Freq: Once | INTRAVENOUS | Status: AC
  Administered 2017-06-28: 240 mg via INTRAVENOUS
  Filled 2017-06-28: qty 24

## 2017-06-28 MED ORDER — DRONABINOL 2.5 MG PO CAPS: 2.5000 mg | ORAL_CAPSULE | Freq: Two times a day (BID) | ORAL | 1 refills | Status: DC

## 2017-06-28 MED ORDER — SODIUM CHLORIDE 0.9 % IJ SOLN
10.0000 mL | INTRAMUSCULAR | Status: DC | PRN
  Administered 2017-06-28: 10 mL
  Filled 2017-06-28: qty 10

## 2017-06-29 NOTE — Patient Instructions (Signed)
[facility] Discharge Instructions for Patients Receiving Chemotherapy   If you have a lab appointment with the [facility] please come in thru the  Main Entrance and check in at the main information desk   Today you received the following immunotherapy agents: Opdivo  To help prevent nausea and vomiting after your treatment, we encourage you to take your nausea medication as prescribed.    If you develop nausea and vomiting, or diarrhea that is not controlled by your medication, call the clinic.  The clinic phone number is [phone]. Office hours are Monday-Friday 8:30am-5:00pm.  BELOW ARE SYMPTOMS THAT SHOULD BE REPORTED IMMEDIATELY:  *FEVER GREATER THAN 101.0 F  *CHILLS WITH OR WITHOUT FEVER  NAUSEA AND VOMITING THAT IS NOT CONTROLLED WITH YOUR NAUSEA MEDICATION  *UNUSUAL SHORTNESS OF BREATH  *UNUSUAL BRUISING OR BLEEDING  TENDERNESS IN MOUTH AND THROAT WITH OR WITHOUT PRESENCE OF ULCERS  *URINARY PROBLEMS  *BOWEL PROBLEMS  UNUSUAL RASH Items with * indicate a potential emergency and should be followed up as soon as possible. If you have an emergency after office hours please contact your primary care physician or go to the nearest emergency department.  Please call the clinic during office hours if you have any questions or concerns.   You may also contact the Patient Navigator at [phone] should you have any questions or need assistance in obtaining follow up care.      Resources For Cancer Patients and their Caregivers ? American Cancer Society: Can assist with transportation, wigs, general needs, runs Look Good Feel Better.        205-624-5054 ? Cancer Care: Provides financial assistance, online support groups, medication/co-pay assistance.  1-800-813-HOPE 720 862 4738) ? [place] Assists [place] cancer patients and their families through emotional , educational and financial support.   (617) 259-6511 ? [place] DSS Where to apply for food stamps, Medicaid and utility assistance. ([phone] ? [employer]: Transportation to medical appointments. 9038881771 ? Social Security Administration: May apply for disability if have a Stage IV cancer. 2392455485 [phone] ? [employer], Disability and Transit Services: Assists with nutrition, care and transit needs. [phone]

## 2017-06-29 NOTE — Progress Notes (Signed)
Late entry due to EPIC downtime: patient presents for immunotherapy infusion per MD orders. Patient tolerated infusion without incidence. Patient discharged ambulatory with brother. Patient to follow up as scheduled.

## 2017-07-05 ENCOUNTER — Other Ambulatory Visit (HOSPITAL_COMMUNITY): Payer: Self-pay | Admitting: *Deleted

## 2017-07-05 DIAGNOSIS — C3411 Malignant neoplasm of upper lobe, right bronchus or lung: Secondary | ICD-10-CM

## 2017-07-05 DIAGNOSIS — C7951 Secondary malignant neoplasm of bone: Secondary | ICD-10-CM

## 2017-07-12 ENCOUNTER — Encounter (HOSPITAL_COMMUNITY): Payer: Self-pay

## 2017-07-12 ENCOUNTER — Encounter (HOSPITAL_BASED_OUTPATIENT_CLINIC_OR_DEPARTMENT_OTHER): Payer: 59

## 2017-07-12 ENCOUNTER — Encounter (HOSPITAL_COMMUNITY): Payer: 59

## 2017-07-12 ENCOUNTER — Encounter (HOSPITAL_BASED_OUTPATIENT_CLINIC_OR_DEPARTMENT_OTHER): Payer: 59 | Admitting: Oncology

## 2017-07-12 VITALS — BP 99/64 | HR 78 | Temp 98.7°F | Resp 18 | Wt 98.2 lb

## 2017-07-12 VITALS — BP 100/65 | HR 86 | Temp 98.5°F | Ht 59.0 in | Wt 98.2 lb

## 2017-07-12 DIAGNOSIS — C7971 Secondary malignant neoplasm of right adrenal gland: Secondary | ICD-10-CM | POA: Diagnosis not present

## 2017-07-12 DIAGNOSIS — C7972 Secondary malignant neoplasm of left adrenal gland: Secondary | ICD-10-CM

## 2017-07-12 DIAGNOSIS — C3411 Malignant neoplasm of upper lobe, right bronchus or lung: Secondary | ICD-10-CM

## 2017-07-12 DIAGNOSIS — C7951 Secondary malignant neoplasm of bone: Secondary | ICD-10-CM

## 2017-07-12 DIAGNOSIS — Z5112 Encounter for antineoplastic immunotherapy: Secondary | ICD-10-CM | POA: Diagnosis not present

## 2017-07-12 DIAGNOSIS — E278 Other specified disorders of adrenal gland: Secondary | ICD-10-CM

## 2017-07-12 LAB — CBC WITH DIFFERENTIAL/PLATELET
BASOS ABS: 0 10*3/uL (ref 0.0–0.1)
BASOS PCT: 0 %
EOS ABS: 0.2 10*3/uL (ref 0.0–0.7)
EOS PCT: 2 %
HCT: 36.8 % (ref 36.0–46.0)
Hemoglobin: 11.9 g/dL — ABNORMAL LOW (ref 12.0–15.0)
LYMPHS PCT: 29 %
Lymphs Abs: 2.6 10*3/uL (ref 0.7–4.0)
MCH: 29.8 pg (ref 26.0–34.0)
MCHC: 32.3 g/dL (ref 30.0–36.0)
MCV: 92 fL (ref 78.0–100.0)
MONO ABS: 0.6 10*3/uL (ref 0.1–1.0)
Monocytes Relative: 6 %
Neutro Abs: 5.8 10*3/uL (ref 1.7–7.7)
Neutrophils Relative %: 63 %
PLATELETS: 301 10*3/uL (ref 150–400)
RBC: 4 MIL/uL (ref 3.87–5.11)
RDW: 16.4 % — AB (ref 11.5–15.5)
WBC: 9.2 10*3/uL (ref 4.0–10.5)

## 2017-07-12 LAB — COMPREHENSIVE METABOLIC PANEL
ALBUMIN: 3.8 g/dL (ref 3.5–5.0)
ALT: 13 U/L — AB (ref 14–54)
AST: 18 U/L (ref 15–41)
Alkaline Phosphatase: 62 U/L (ref 38–126)
Anion gap: 4 — ABNORMAL LOW (ref 5–15)
BUN: 18 mg/dL (ref 6–20)
CHLORIDE: 103 mmol/L (ref 101–111)
CO2: 27 mmol/L (ref 22–32)
CREATININE: 1.34 mg/dL — AB (ref 0.44–1.00)
Calcium: 9.7 mg/dL (ref 8.9–10.3)
GFR calc Af Amer: 49 mL/min — ABNORMAL LOW (ref >60)
GFR, EST NON AFRICAN AMERICAN: 42 mL/min — AB (ref >60)
GLUCOSE: 86 mg/dL (ref 65–99)
POTASSIUM: 4.8 mmol/L (ref 3.5–5.1)
SODIUM: 134 mmol/L — AB (ref 135–145)
Total Bilirubin: 0.4 mg/dL (ref 0.3–1.2)
Total Protein: 7.3 g/dL (ref 6.5–8.1)

## 2017-07-12 LAB — TSH: TSH: 1.327 u[IU]/mL (ref 0.350–4.500)

## 2017-07-12 MED ORDER — HEPARIN SOD (PORK) LOCK FLUSH 100 UNIT/ML IV SOLN
500.0000 [IU] | Freq: Once | INTRAVENOUS | Status: AC | PRN
  Administered 2017-07-12: 500 [IU]

## 2017-07-12 MED ORDER — SODIUM CHLORIDE 0.9 % IV SOLN
Freq: Once | INTRAVENOUS | Status: AC
  Administered 2017-07-12: 11:00:00 via INTRAVENOUS

## 2017-07-12 MED ORDER — SODIUM CHLORIDE 0.9 % IV SOLN
240.0000 mg | Freq: Once | INTRAVENOUS | Status: AC
  Administered 2017-07-12: 240 mg via INTRAVENOUS
  Filled 2017-07-12: qty 24

## 2017-07-12 MED ORDER — SODIUM CHLORIDE 0.9 % IJ SOLN
10.0000 mL | INTRAMUSCULAR | Status: DC | PRN
  Administered 2017-07-12: 10 mL
  Filled 2017-07-12: qty 10

## 2017-07-12 NOTE — Progress Notes (Signed)
[name], MD [place] [address]  CURRENT THERAPY:Nivolumab every 2 weeks.  Xgeva every 4 weeks.  INTERVAL HISTORY: Melissa Sullivan 61 y.o. female returns for followup of Stage IV adenocarcinoma, pancoast tumor, of right lung.  (EGFR and ALK negative) complicated by right brachial plexus neuralgia with disease on presentation including bilateral adrenal metastases, retroperitoneal lymph nodes, and osseous involvement.    Cancer of upper lobe of right lung ([place])   07/28/2014 Imaging    CT chest: Large R apical mass consistent with malignancy. This is destroying the R 2nd rib with extension into adjacent soft tissue. R hilar adenopathy with R 5cm adrenal metastatic lesion.      08/01/2014 Initial Biopsy    Lung, needle/core biopsy(ies), right upper lobe - POORLY DIFFERENTIATED ADENOCARCINOMA, SEE COMMENT.      08/08/2014 PET scan    Large hypermetabolic R apical mass with evidence of direct chest wall and mediastinal invasion, right retrocrural lymphadenopathy, extensive retroperitoneal lymphadenopathy, and metastatic lesions to the adrenal glands       09/02/2014 - 11/04/2014 Chemotherapy    Cisplatin/Pemetrexed/Avastin every 21 days x 4 cycles      10/07/2014 - 10/27/2014 Radiation Therapy    Right lung apex for control of brachioplexopathy.      12/24/2014 - 02/25/2015 Chemotherapy    Alimta/Avastin every 21 days.      02/20/2015 Imaging    Increase in size of right adrenal metastasis and subjacent confluent retrocaval lymphadenopathy      02/25/2015 -  Chemotherapy    Nivolumab, zometa      05/04/2015 Imaging    CT CAP- Stable to slight decrease in the posterior right apical lesion. Stable appearance of posterior right upper rib an upper thoracic bony lesions. Slight improvement in right upper lobe tree-in-bud opacity. No new or progressive findings in...      07/28/2015 Imaging    CT CAP- Reduced size of the right apical pleural parenchymal  lesion and reduced size of the right adrenal metastatic lesion. Resolution of prior retrocrural adenopathy.  Right eccentric T1 and T2 sclerosis with sclerosis and tapering of the right second..      11/17/2015 Imaging    CT CAP- Stable soft tissue thickening in the apex of the right hemi thorax. Stable right adrenal metastasis. Nodularity along the trachea and mainstem bronchi, relatively new from 07/28/2015, favoring adherent debris.      11/18/2015 Treatment Plan Change    Zometa HELD for upcoming tooth extraction      11/24/2015 Treatment Plan Change    Zometa on hold at this time in preparation for tooth extraction in March 2017.  Zometa las given on 11/18/2015.      02/03/2016 Imaging    CT CAP- Heterogeneous right apical masslike consolidation and right adrenal metastasis are unchanged      04/06/2016 Treatment Plan Change    Zometa restarted 6 weeks out from tooth extraction (04/06/2016)      05/12/2016 Imaging    CT CAP- NED in the chest, abdomen or pelvis.  Some areas of nodularity associated with the mainstem bronchi in the left upper lobe bronchus, favored to represent adherent inspissated secretions      08/17/2016 Imaging    CT CAP- 1. Stable CTs of the chest and abdomen. No evidence of progressive metastatic disease. 2. Probable treated tumor at the right apex, right adrenal gland and T2 vertebral body, stable. 3. Fluctuating nodularity along the walls  of the trachea and mainstem bronchi, likely secretions.      12/12/2016 Imaging    Further decrease in size of treated tumor within the right apex. 2. Stable treated tumor involving the right second rib and T2 vertebra. 3. Stable right adrenal gland treated tumor. 4. Emphysema 5. Aortic atherosclerosis      01/04/2017 Imaging    MRI brain- Normal brain MRI.  No intracranial metastatic disease.      03/15/2017 Imaging    CT CAP- 1. No new or progressive metastatic disease in the chest or abdomen. 2. Stable treated  tumor in the apical right upper lobe. Stable treated right posterior second rib and right T2 vertebral lesions. Stable treated right adrenal metastasis. 3. Aortic atherosclerosis. 4. Moderate emphysema with mild diffuse bronchial wall thickening, suggesting COPD.      06/12/2017 Imaging    CT CAP 1. Similar appearance of treated primary within the right apex. 2. Similar areas of sclerosis within the right second rib, T2, and less so T1 vertebral bodies. These are most consistent with treated metastasis. 3. Similar right adrenal treated metastasis. 4. No evidence of new or progressive disease. 5. Similar right and progressive left areas of bronchial wall thickening and probable mucoid impaction. Correlate with interval infectious symptoms. 6.  Emphysema (ICD10-J43.9). 7. Coronary artery atherosclerosis. Aortic Atherosclerosis (ICD10-I70.0).         Patient presented for follow up today. Overall she is doing well. She has no complaints today. She is having trouble getting Boost due to the cost. She denies any chest pain, shortness of breath, abdominal pain, focal weakness.  Otherwise she is tolerating opdivo well without any side effects.  Review of Systems  Constitutional: Negative.  Negative for chills, fever and weight loss.  HENT: Negative.  Negative for hearing loss, sore throat and tinnitus.   Eyes: Negative.  Negative for blurred vision, photophobia and discharge.  Respiratory: Negative.  Negative for cough, hemoptysis, shortness of breath and wheezing.   Cardiovascular: Negative.  Negative for chest pain, palpitations, orthopnea, claudication and leg swelling.  Gastrointestinal: Negative.  Negative for abdominal pain, blood in stool, constipation, diarrhea, melena, nausea and vomiting.  Genitourinary: Negative.  Negative for dysuria and hematuria.  Musculoskeletal: Negative.  Negative for back pain, joint pain and myalgias.  Skin: Negative.  Negative for itching and  rash.  Neurological: Negative.  Negative for dizziness, weakness and headaches.  Endo/Heme/Allergies: Negative.  Negative for environmental allergies and polydipsia. Does not bruise/bleed easily.  Psychiatric/Behavioral: Negative for depression. The patient is not nervous/anxious and does not have insomnia.     Past Medical History:  Diagnosis Date  . Adrenal mass, right ([place]) 07/28/2014  . Anemia   . Bone metastases ([place]) 04/05/2016  . Cancer ([place])    lung  right  . Diabetes mellitus without complication ([place])   . GERD (gastroesophageal reflux disease)   . Hyperlipidemia   . Hypertension   . Hypothyroidism due to medication 01/30/2017  . Lung mass 07/28/2014  . Reflux     Past Surgical History:  Procedure Laterality Date  . AMPUTATION Right 02/22/2017   Procedure: PARTIAL AMPUTATION RIGHT GREAT TOE;  Surgeon: Caprice Beaver, DPM;  Location: [facility];  Service: Podiatry;  Laterality: Right;  . APPENDECTOMY    . ESOPHAGOGASTRODUODENOSCOPY N/A 12/09/2014   QZR:[id number] esophageal stricture/mild-to-noderate erosive gastritis. negative H.pylori  . FLEXIBLE SIGMOIDOSCOPY  2011   Dr. [name]: hyperplastic polyp  . LUNG BIOPSY Right 07/2014   CT guided  . MALONEY DILATION N/A  12/09/2014   Procedure: [name] DILATION;  Surgeon: [name], MD;  Location: [facility] SU[facility];  Service: Endoscopy;  Laterality: N/A;  . PORTACATH PLACEMENT Left 09/01/2014  . SAVORY DILATION N/A 12/09/2014   Procedure: SAVORY DILATION;  Surgeon: [name], MD;  Location: [facility] SU[facility];  Service: Endoscopy;  Laterality: N/A;    Family History  Problem Relation Age of Onset  . Cancer Sister   . Colon cancer Neg Hx     Social History   Social History  . Marital status: Married    Spouse name: N/A  . Number of children: N/A  . Years of education: N/A   Social History Main Topics  . Smoking status: Light Tobacco Smoker    Packs/day: 0.30    Years: 20.00    Types: Cigarettes  . Smokeless tobacco: Never  Used  . Alcohol use No  . Drug use: No  . Sexual activity: Not Asked   Other Topics Concern  . None   Social History Narrative  . None     PHYSICAL EXAMINATION  ECOG PERFORMANCE STATUS: 0 - Asymptomatic   Physical Exam  Constitutional: She is oriented to person, place, and time and well-developed, well-nourished, and in no distress. No distress.  HENT:  Head: Normocephalic and atraumatic.  Mouth/Throat: No oropharyngeal exudate.  Eyes: Pupils are equal, round, and reactive to light. Conjunctivae are normal. No scleral icterus.  Neck: Normal range of motion. Neck supple. No JVD present.  Cardiovascular: Normal rate, regular rhythm and normal heart sounds.  Exam reveals no gallop and no friction rub.   No murmur heard. Pulmonary/Chest: Effort normal and breath sounds normal. No respiratory distress. She has no wheezes. She has no rales.  Abdominal: Soft. Bowel sounds are normal. She exhibits no distension. There is no tenderness. There is no guarding.  Musculoskeletal: She exhibits no edema or tenderness.  Lymphadenopathy:    She has no cervical adenopathy.  Neurological: She is alert and oriented to person, place, and time. No cranial nerve deficit.  Skin: Skin is warm and dry. No rash noted. No erythema. No pallor.  Psychiatric: Affect and judgment normal.    LABORATORY DATA: CBC    Component Value Date/Time   WBC 9.2 07/12/2017 1020   RBC 4.00 07/12/2017 1020   HGB 11.9 (L) 07/12/2017 1020   HCT 36.8 07/12/2017 1020   PLT 301 07/12/2017 1020   MCV 92.0 07/12/2017 1020   MCH 29.8 07/12/2017 1020   MCHC 32.3 07/12/2017 1020   RDW 16.4 (H) 07/12/2017 1020   LYMPHSABS 2.6 07/12/2017 1020   MONOABS 0.6 07/12/2017 1020   EOSABS 0.2 07/12/2017 1020   BASOSABS 0.0 07/12/2017 1020      Chemistry      Component Value Date/Time   NA 134 (L) 07/12/2017 1020   K 4.8 07/12/2017 1020   CL 103 07/12/2017 1020   CO2 27 07/12/2017 1020   BUN 18 07/12/2017 1020    CREATININE 1.34 (H) 07/12/2017 1020      Component Value Date/Time   CALCIUM 9.7 07/12/2017 1020   CALCIUM 9.7 06/03/2015 1000   ALKPHOS 62 07/12/2017 1020   AST 18 07/12/2017 1020   ALT 13 (L) 07/12/2017 1020   BILITOT 0.4 07/12/2017 1020     Lab Results  Component Value Date   TSH 1.327 07/12/2017     PENDING LABS:   RADIOGRAPHIC STUDIES:  No results found.   PATHOLOGY:    ASSESSMENT AND PLAN:  Stage IV  adenocarcinoma, pancoast tumor, of right lung.  PLAN:  Clinically stable.  Labs reviewed, proceed with opdivo today. Tolerating opdivo well. Continue with Nivolumab every 2 weeks and Xgeva every 4 weeks.   Hold this month's xgeva. Patient is going to get dental work done. Resume xgeva in September. RTC in 4 weeks for follow up.  All questions were answered. The patient knows to call the clinic with any problems, questions or concerns. We can certainly see the patient much sooner if necessary.   This note is electronically signed by: Twana First, MD 07/12/2017 12:36 PM

## 2017-07-12 NOTE — Patient Instructions (Signed)
[facility] Discharge Instructions for Patients Receiving Chemotherapy  Today you received the following chemotherapy agents Opdivo.  Keep scheduled appointments and call for any questions or concerns.   To help prevent nausea and vomiting after your treatment, we encourage you to take your nausea medication.    If you develop nausea and vomiting that is not controlled by your nausea medication, call the clinic.   BELOW ARE SYMPTOMS THAT SHOULD BE REPORTED IMMEDIATELY:  *FEVER GREATER THAN 100.5 F  *CHILLS WITH OR WITHOUT FEVER  NAUSEA AND VOMITING THAT IS NOT CONTROLLED WITH YOUR NAUSEA MEDICATION  *UNUSUAL SHORTNESS OF BREATH  *UNUSUAL BRUISING OR BLEEDING  TENDERNESS IN MOUTH AND THROAT WITH OR WITHOUT PRESENCE OF ULCERS  *URINARY PROBLEMS  *BOWEL PROBLEMS  UNUSUAL RASH Items with * indicate a potential emergency and should be followed up as soon as possible.  Feel free to call the clinic you have any questions or concerns. The clinic phone number is [phone].  Please show the [facility] at check-in to the Emergency Department and triage nurse.

## 2017-07-12 NOTE — Progress Notes (Signed)
Labs reviewed with Dr. Talbert Cage.  Ok to treat today.  Patient tolerated chemotherapy with no complaints voiced.  Port site clean, dry, and intact with no bruising or swelling noted at site.  Discharged ambulatory with VSS and with family.

## 2017-07-17 ENCOUNTER — Other Ambulatory Visit (HOSPITAL_COMMUNITY): Payer: Self-pay | Admitting: Hematology & Oncology

## 2017-07-21 ENCOUNTER — Emergency Department (HOSPITAL_COMMUNITY)
Admission: EM | Admit: 2017-07-21 | Discharge: 2017-07-22 | Disposition: A | Discharge status: Home / Self Care | Payer: 59 | Attending: Emergency Medicine | Admitting: Emergency Medicine

## 2017-07-21 ENCOUNTER — Emergency Department (HOSPITAL_COMMUNITY): Payer: 59

## 2017-07-21 ENCOUNTER — Encounter (HOSPITAL_COMMUNITY): Payer: Self-pay

## 2017-07-21 DIAGNOSIS — Z23 Encounter for immunization: Secondary | ICD-10-CM | POA: Insufficient documentation

## 2017-07-21 DIAGNOSIS — Y939 Activity, unspecified: Secondary | ICD-10-CM | POA: Insufficient documentation

## 2017-07-21 DIAGNOSIS — Y929 Unspecified place or not applicable: Secondary | ICD-10-CM | POA: Diagnosis not present

## 2017-07-21 DIAGNOSIS — Y999 Unspecified external cause status: Secondary | ICD-10-CM | POA: Diagnosis not present

## 2017-07-21 DIAGNOSIS — F17210 Nicotine dependence, cigarettes, uncomplicated: Secondary | ICD-10-CM | POA: Diagnosis not present

## 2017-07-21 DIAGNOSIS — S61313A Laceration without foreign body of left middle finger with damage to nail, initial encounter: Secondary | ICD-10-CM | POA: Diagnosis not present

## 2017-07-21 DIAGNOSIS — I10 Essential (primary) hypertension: Secondary | ICD-10-CM | POA: Insufficient documentation

## 2017-07-21 DIAGNOSIS — S62633B Displaced fracture of distal phalanx of left middle finger, initial encounter for open fracture: Secondary | ICD-10-CM | POA: Insufficient documentation

## 2017-07-21 DIAGNOSIS — E039 Hypothyroidism, unspecified: Secondary | ICD-10-CM | POA: Insufficient documentation

## 2017-07-21 DIAGNOSIS — S62639B Displaced fracture of distal phalanx of unspecified finger, initial encounter for open fracture: Secondary | ICD-10-CM

## 2017-07-21 DIAGNOSIS — S6992XA Unspecified injury of left wrist, hand and finger(s), initial encounter: Secondary | ICD-10-CM | POA: Diagnosis present

## 2017-07-21 DIAGNOSIS — W230XXA Caught, crushed, jammed, or pinched between moving objects, initial encounter: Secondary | ICD-10-CM | POA: Insufficient documentation

## 2017-07-21 DIAGNOSIS — E119 Type 2 diabetes mellitus without complications: Secondary | ICD-10-CM | POA: Insufficient documentation

## 2017-07-21 DIAGNOSIS — Z79899 Other long term (current) drug therapy: Secondary | ICD-10-CM | POA: Insufficient documentation

## 2017-07-21 MED ORDER — TETANUS-DIPHTH-ACELL PERTUSSIS 5-2.5-18.5 LF-MCG/0.5 IM SUSP
0.5000 mL | Freq: Once | INTRAMUSCULAR | Status: AC
  Administered 2017-07-21: 0.5 mL via INTRAMUSCULAR
  Filled 2017-07-21: qty 0.5

## 2017-07-21 MED ORDER — CEPHALEXIN 500 MG PO CAPS
500.0000 mg | ORAL_CAPSULE | Freq: Once | ORAL | Status: AC
  Administered 2017-07-21: 500 mg via ORAL
  Filled 2017-07-21: qty 1

## 2017-07-21 MED ORDER — BUPIVACAINE HCL (PF) 0.5 % IJ SOLN
10.0000 mL | Freq: Once | INTRAMUSCULAR | Status: AC
  Administered 2017-07-21: 10 mL
  Filled 2017-07-21: qty 30

## 2017-07-21 MED ORDER — LIDOCAINE HCL (PF) 1 % IJ SOLN
5.0000 mL | Freq: Once | INTRAMUSCULAR | Status: DC
  Filled 2017-07-21: qty 5

## 2017-07-21 MED ORDER — POVIDONE-IODINE 10 % EX SOLN
CUTANEOUS | Status: AC
  Administered 2017-07-21: 1
  Filled 2017-07-21: qty 15

## 2017-07-21 NOTE — ED Notes (Signed)
Patient transported to X-ray 

## 2017-07-21 NOTE — ED Provider Notes (Signed)
[facility] DEPT Provider Note   CSN: [id number] Arrival date & time: 07/21/17  2231  Time seen 23:03 PM    History   Chief Complaint Chief Complaint  Patient presents with  . Finger Injury    HPI Melissa Sullivan is a 61 y.o. female.  HPI  patient states she was getting out of a mini van and her granddaughter accidentally slid the door shut while her left hand was still in the door. She comes in with an injury to her left middle finger tip. This happened about 9 PM. Patient states she is right-handed. She thinks her last tetanus was over 10 years ago. She states she's not having any pain right now.  PCP [name], MD   Past Medical History:  Diagnosis Date  . Adrenal mass, right ([place]) 07/28/2014  . Anemia   . Bone metastases ([place]) 04/05/2016  . Cancer ([place])    lung  right  . Diabetes mellitus without complication ([place])   . GERD (gastroesophageal reflux disease)   . Hyperlipidemia   . Hypertension   . Hypothyroidism due to medication 01/30/2017  . Lung mass 07/28/2014  . Reflux     Patient Active Problem List   Diagnosis Date Noted  . Hypothyroidism due to medication 01/30/2017  . Portacath in place 06/30/2016  . Tobacco use 06/30/2016  . Bone metastases ([place]) 04/05/2016  . Encounter for screening colonoscopy 03/06/2015  . Dysphagia 03/04/2015  . Anemia in neoplastic disease 01/14/2015  . Hypercalcemia 12/24/2014  . Oropharyngeal dysphagia 12/05/2014  . Nausea and vomiting 12/05/2014  . ARF (acute renal failure) ([place]) 12/04/2014  . Abnormal finding on imaging 12/04/2014  . Cancer of upper lobe of right lung ([place]) 08/14/2014  . Adrenal mass, right ([place]) 07/28/2014    Past Surgical History:  Procedure Laterality Date  . AMPUTATION Right 02/22/2017   Procedure: PARTIAL AMPUTATION RIGHT GREAT TOE;  Surgeon: Caprice Beaver, DPM;  Location: [facility];  Service: Podiatry;  Laterality: Right;  . APPENDECTOMY    . ESOPHAGOGASTRODUODENOSCOPY N/A 12/09/2014   SEG:BTDVVO esophageal stricture/mild-to-noderate erosive gastritis. negative H.pylori  . FLEXIBLE SIGMOIDOSCOPY  2011   Dr. [name]: hyperplastic polyp  . LUNG BIOPSY Right 07/2014   CT guided  . MALONEY DILATION N/A 12/09/2014   Procedure: [name] DILATION;  Surgeon: [name], MD;  Location: [facility];  Service: Endoscopy;  Laterality: N/A;  . PORTACATH PLACEMENT Left 09/01/2014  . SAVORY DILATION N/A 12/09/2014   Procedure: SAVORY DILATION;  Surgeon: [name], MD;  Location: [facility];  Service: Endoscopy;  Laterality: N/A;    OB History    No data available       Home Medications    Prior to Admission medications   Medication Sig Start Date End Date Taking? Authorizing Provider  Calcium Carb-Cholecalciferol (CALCIUM 1000 + D PO) Take 1 tablet by mouth daily.    [provider]  cephALEXin (KEFLEX) 500 MG capsule Take 1 capsule (500 mg total) by mouth 3 (three) times daily. 07/22/17   Rolland Porter, MD  dronabinol (MARINOL) 2.5 MG capsule Take 1 capsule (2.5 mg total) by mouth 2 (two) times daily before lunch and supper. 06/28/17   Twana First, MD  Fluticasone-Salmeterol (ADVAIR DISKUS) 500-50 MCG/DOSE AEPB Inhale 1 puff into the lungs 2 (two) times daily. Patient taking differently: Inhale 1 puff into the lungs 2 (two) times daily as needed (for respiratory issues.).  11/16/16   Penland, Kelby Fam, MD  glucose blood test  strip Use as instructed Patient taking differently: 1 each by Other route every other day. Use as instructed 09/21/16   Penland, Kelby Fam, MD  ibuprofen (ADVIL,MOTRIN) 200 MG tablet Take 400 mg by mouth every 8 (eight) hours as needed (for pain.).    [provider]  KLOR-CON M20 20 MEQ tablet TAKE 1 TABLET BY MOUTH TWICE DAILY 05/22/17   Baird Cancer, PA-C  levothyroxine (SYNTHROID, LEVOTHROID) 25 MCG tablet TAKE 1 TABLET BY MOUTH ONCE DAILY BEFORE BREAKFAST 06/15/17   Baird Cancer, PA-C  lidocaine-prilocaine (EMLA) cream Apply a  quarter size amount to port site 1 hour prior to chemo. Do not rub in. Cover with plastic wrap. 09/21/16   Penland, Kelby Fam, MD  magic mouthwash SOLN Take 5 mLs by mouth 4 (four) times daily as needed for mouth pain. 11/30/16   Baird Cancer, PA-C  metoCLOPramide (REGLAN) 10 MG tablet TAKE ONE TABLET BY MOUTH 4 TIMES DAILY 08/18/15   Penland, Kelby Fam, MD  Nivolumab (OPDIVO IV) Inject 240 mg into the vein every 14 (fourteen) days.     [provider]  NUTRITIONAL SUPPLEMENT LIQD Please provide patient with a nutrtional supplement 06/15/17   Baird Cancer, PA-C  omeprazole (PRILOSEC) 40 MG capsule Take 1 capsule (40 mg total) by mouth daily. 05/23/17   Annitta Needs, NP  ondansetron (ZOFRAN) 8 MG tablet Take 1 tablet (8 mg total) by mouth every 8 (eight) hours as needed for nausea or vomiting. 06/09/15   Baird Cancer, PA-C  ONE TOUCH LANCETS MISC Use as directed Patient taking differently: 1 each by Other route every other day. Use as directed 09/21/16   Penland, Kelby Fam, MD  Oxycodone HCl 10 MG TABS Take 1-2 tablets (10-20 mg total) by mouth every 6 (six) hours as needed (pain.). 05/31/17   Baird Cancer, PA-C  senna (SENOKOT) 8.6 MG tablet Take 2 tablets by mouth at bedtime as needed for constipation. Reported on 02/24/2016    [provider]  simvastatin (ZOCOR) 40 MG tablet TAKE ONE TABLET BY MOUTH ONCE DAILY IN THE MORNING 07/17/17   Holley Bouche, NP  triamcinolone (KENALOG) 0.025 % cream Apply 1 application topically 2 (two) times daily as needed (for dry/itching skin.). Reported on 02/24/2016    [provider]    Family History Family History  Problem Relation Age of Onset  . Cancer Sister   . Colon cancer Neg Hx     Social History Social History  Substance Use Topics  . Smoking status: Light Tobacco Smoker    Packs/day: 0.30    Years: 20.00    Types: Cigarettes  . Smokeless tobacco: Never Used  . Alcohol use No     Allergies     Patient has no known allergies.   Review of Systems Review of Systems  All other systems reviewed and are negative.    Physical Exam Updated Vital Signs BP 126/79 (BP Location: Right Arm)   Pulse 91   Temp 98.3 F (36.8 C) (Oral)   Resp 16   Ht 4\' 11"  (1.499 m)   Wt 44.5 kg (98 lb)   SpO2 99%   BMI 19.79 kg/m   Vital signs normal    Physical Exam  Constitutional: She is oriented to person, place, and time. She appears well-developed and well-nourished.  HENT:  Head: Normocephalic and atraumatic.  Right Ear: External ear normal.  Left Ear: External ear normal.  Nose: Nose normal.  Eyes:  Conjunctivae and EOM are normal.  Neck: Normal range of motion.  Cardiovascular: Normal rate.   Pulmonary/Chest: Effort normal. She has no wheezes.  Musculoskeletal: Normal range of motion. She exhibits deformity.  Patient has a laceration placed horizontally across the middle portion of her left middle finger fingernail with intact skin on the volar aspect of the finger. Please see photos.  Neurological: She is alert and oriented to person, place, and time. No cranial nerve deficit.  Skin: Skin is warm and dry. No erythema.  Psychiatric: She has a normal mood and affect. Her behavior is normal. Thought content normal.  Nursing note and vitals reviewed.        ED Treatments / Results  Labs (all labs ordered are listed, but only abnormal results are displayed) Labs Reviewed - No data to display  EKG  EKG Interpretation None       Radiology Dg Finger Middle Left  Result Date: 07/21/2017 CLINICAL DATA:  Crush injury in a car door tonight. EXAM: LEFT MIDDLE FINGER 2+V COMPARISON:  None. FINDINGS: Mildly comminuted transverse fracture across the tuft of the third distal phalanx with overlying soft tissue deformity. IMPRESSION: Third distal phalangeal tuft fracture. Electronically Signed   By: Andreas Newport M.D.   On: 07/21/2017 22:59    Procedures Procedures  (including critical care time)  Medications Ordered in ED Medications  bupivacaine (MARCAINE) 0.5 % injection 10 mL (10 mLs Infiltration Given by Other 07/21/17 2321)  Tdap (BOOSTRIX) injection 0.5 mL (0.5 mLs Intramuscular Given 07/21/17 2320)  cephALEXin (KEFLEX) capsule 500 mg (500 mg Oral Given 07/21/17 2321)  povidone-iodine (BETADINE) 10 % external solution (1 application  Given 5/32/99 2321)     Initial Impression / Assessment and Plan / ED Course  I have reviewed the triage vital signs and the nursing notes.  Pertinent labs & imaging results that were available during my care of the patient were reviewed by me and considered in my medical decision making (see chart for details).    Patient's tetanus status is updated. Due to the compound fracture she was started on oral Keflex. Patient states she currently doesn't have any pain. Patient was prepared for digital block and repair of her finger. She was repaired by PA Sima Matas  Final Clinical Impressions(s) / ED Diagnoses   Final diagnoses:  Open fracture of tuft of distal phalanx of finger  Laceration of left middle finger without foreign body with damage to nail, initial encounter    New Prescriptions New Prescriptions   CEPHALEXIN (KEFLEX) 500 MG CAPSULE    Take 1 capsule (500 mg total) by mouth 3 (three) times daily.   Plan discharge  Rolland Porter, MD, Barbette Or, MD 07/22/17 [phone]

## 2017-07-21 NOTE — ED Triage Notes (Signed)
Pt states her finger was crushed in a sliding door at her home, has avulsion/laceration to tip of left middle finger.

## 2017-07-22 MED ORDER — CEPHALEXIN 500 MG PO CAPS: 500.0000 mg | ORAL_CAPSULE | Freq: Three times a day (TID) | ORAL | 0 refills | Status: DC

## 2017-07-22 NOTE — ED Provider Notes (Signed)
Finger laceration repair.  Patient is a 61 year old female who unfortunately had her left middle finger crushed in a [employer], community. She sustained a comminuted transverse fracture of the distal phalanx. I discussed with the patient the need to have the area closed. I discussed the procedure in detail, and the patient gives permission to proceed with the closure. Patient identified by arm band. Procedural time out taken. The left middle finger was painted with Betadine. A digital block was achieved using 0.5% Sensorcaine. After appropriate anesthetic, the wound was irrigated with sterile saline. Further probing reveals an open fracture.  The finger was again painted with Betadine. Using sterile technique the laceration of the distal portion of the middle finger as well and the finger nail  was repaired with 6 interrupted sutures of 4-0 nylon with good wound edge closure. Sterile dressing applied. Finger splint requested. Pt tolerated procedure without problem.   Lily Kocher, PA-C 07/22/17 0038    Lily Kocher, PA-C 07/22/17 9758    Rolland Porter, MD 07/22/17 [phone]

## 2017-07-22 NOTE — Discharge Instructions (Signed)
Elevate your finger, use ice packs for comfort of swelling. Take the antibiotics as prescribed. Call Dr Ruthe Mannan office either on Monday (holiday) or Tuesday, to get an appointment for him to evaluate your finger. Return to the ED if the wound gets infected (increased redness, swelling, drains pus, you get a fever, or you see a red streak run up your hand or arm).  Dr Aline Brochure will talk to you about when the sutures should be removed, usually about 10-12 days.

## 2017-07-25 ENCOUNTER — Telehealth: Payer: Self-pay | Admitting: Orthopaedic Surgery

## 2017-07-25 NOTE — Telephone Encounter (Signed)
Patient just called 4:07p.m. 07/25/17, to relay that he was treated at [facility] Emergency room 07/21/17 for an open fracture of left middle finger - chart note indicates "She sustained a comminuted transverse fracture of the distal phalanx." - States was referred to Dr Aline Brochure, who is out of clinic this week.  Relayed we will review with our provider Dr Luna Glasgow and call her back today.  Patient ph# [phone].

## 2017-07-25 NOTE — Telephone Encounter (Signed)
Schedule apt

## 2017-07-26 ENCOUNTER — Encounter (HOSPITAL_COMMUNITY): Payer: Self-pay

## 2017-07-26 ENCOUNTER — Encounter: Payer: Self-pay | Admitting: Orthopaedic Surgery

## 2017-07-26 ENCOUNTER — Encounter (HOSPITAL_COMMUNITY): Payer: 59 | Attending: Oncology

## 2017-07-26 ENCOUNTER — Ambulatory Visit (INDEPENDENT_AMBULATORY_CARE_PROVIDER_SITE_OTHER): Payer: 59 | Admitting: Orthopaedic Surgery

## 2017-07-26 ENCOUNTER — Encounter (HOSPITAL_BASED_OUTPATIENT_CLINIC_OR_DEPARTMENT_OTHER): Payer: 59

## 2017-07-26 VITALS — BP 105/68 | HR 85 | Temp 97.7°F | Ht 61.0 in | Wt 98.0 lb

## 2017-07-26 VITALS — BP 99/50 | HR 78 | Temp 98.7°F | Resp 18 | Wt 98.4 lb

## 2017-07-26 DIAGNOSIS — Z5112 Encounter for antineoplastic immunotherapy: Secondary | ICD-10-CM

## 2017-07-26 DIAGNOSIS — I10 Essential (primary) hypertension: Secondary | ICD-10-CM | POA: Insufficient documentation

## 2017-07-26 DIAGNOSIS — C7971 Secondary malignant neoplasm of right adrenal gland: Secondary | ICD-10-CM

## 2017-07-26 DIAGNOSIS — E785 Hyperlipidemia, unspecified: Secondary | ICD-10-CM | POA: Diagnosis not present

## 2017-07-26 DIAGNOSIS — F17210 Nicotine dependence, cigarettes, uncomplicated: Secondary | ICD-10-CM

## 2017-07-26 DIAGNOSIS — K219 Gastro-esophageal reflux disease without esophagitis: Secondary | ICD-10-CM | POA: Diagnosis not present

## 2017-07-26 DIAGNOSIS — C7951 Secondary malignant neoplasm of bone: Secondary | ICD-10-CM

## 2017-07-26 DIAGNOSIS — Z89411 Acquired absence of right great toe: Secondary | ICD-10-CM | POA: Insufficient documentation

## 2017-07-26 DIAGNOSIS — C7972 Secondary malignant neoplasm of left adrenal gland: Secondary | ICD-10-CM

## 2017-07-26 DIAGNOSIS — S62633B Displaced fracture of distal phalanx of left middle finger, initial encounter for open fracture: Secondary | ICD-10-CM

## 2017-07-26 DIAGNOSIS — E039 Hypothyroidism, unspecified: Secondary | ICD-10-CM | POA: Insufficient documentation

## 2017-07-26 DIAGNOSIS — C3411 Malignant neoplasm of upper lobe, right bronchus or lung: Secondary | ICD-10-CM | POA: Diagnosis not present

## 2017-07-26 DIAGNOSIS — E278 Other specified disorders of adrenal gland: Secondary | ICD-10-CM

## 2017-07-26 DIAGNOSIS — E119 Type 2 diabetes mellitus without complications: Secondary | ICD-10-CM | POA: Insufficient documentation

## 2017-07-26 LAB — COMPREHENSIVE METABOLIC PANEL
ALBUMIN: 3.9 g/dL (ref 3.5–5.0)
ALT: 11 U/L — ABNORMAL LOW (ref 14–54)
ANION GAP: 8 (ref 5–15)
AST: 18 U/L (ref 15–41)
Alkaline Phosphatase: 61 U/L (ref 38–126)
BILIRUBIN TOTAL: 0.4 mg/dL (ref 0.3–1.2)
BUN: 14 mg/dL (ref 6–20)
CHLORIDE: 104 mmol/L (ref 101–111)
CO2: 23 mmol/L (ref 22–32)
Calcium: 9.8 mg/dL (ref 8.9–10.3)
Creatinine, Ser: 1.39 mg/dL — ABNORMAL HIGH (ref 0.44–1.00)
GFR calc Af Amer: 47 mL/min — ABNORMAL LOW (ref >60)
GFR calc non Af Amer: 40 mL/min — ABNORMAL LOW (ref >60)
GLUCOSE: 97 mg/dL (ref 65–99)
POTASSIUM: 4.3 mmol/L (ref 3.5–5.1)
SODIUM: 135 mmol/L (ref 135–145)
TOTAL PROTEIN: 7.2 g/dL (ref 6.5–8.1)

## 2017-07-26 LAB — CBC WITH DIFFERENTIAL/PLATELET
BASOS ABS: 0 10*3/uL (ref 0.0–0.1)
BASOS PCT: 0 %
EOS ABS: 0.3 10*3/uL (ref 0.0–0.7)
EOS PCT: 3 %
HEMATOCRIT: 35.2 % — AB (ref 36.0–46.0)
Hemoglobin: 11.6 g/dL — ABNORMAL LOW (ref 12.0–15.0)
Lymphocytes Relative: 34 %
Lymphs Abs: 2.9 10*3/uL (ref 0.7–4.0)
MCH: 30.3 pg (ref 26.0–34.0)
MCHC: 33 g/dL (ref 30.0–36.0)
MCV: 91.9 fL (ref 78.0–100.0)
MONO ABS: 0.8 10*3/uL (ref 0.1–1.0)
MONOS PCT: 10 %
NEUTROS ABS: 4.4 10*3/uL (ref 1.7–7.7)
Neutrophils Relative %: 53 %
PLATELETS: 252 10*3/uL (ref 150–400)
RBC: 3.83 MIL/uL — ABNORMAL LOW (ref 3.87–5.11)
RDW: 16.5 % — AB (ref 11.5–15.5)
WBC: 8.5 10*3/uL (ref 4.0–10.5)

## 2017-07-26 LAB — TSH: TSH: 3.742 u[IU]/mL (ref 0.350–4.500)

## 2017-07-26 MED ORDER — SODIUM CHLORIDE 0.9 % IV SOLN
240.0000 mg | Freq: Once | INTRAVENOUS | Status: AC
  Administered 2017-07-26: 240 mg via INTRAVENOUS
  Filled 2017-07-26: qty 24

## 2017-07-26 MED ORDER — SODIUM CHLORIDE 0.9 % IV SOLN
Freq: Once | INTRAVENOUS | Status: AC
  Administered 2017-07-26: 13:00:00 via INTRAVENOUS

## 2017-07-26 MED ORDER — HEPARIN SOD (PORK) LOCK FLUSH 100 UNIT/ML IV SOLN
500.0000 [IU] | Freq: Once | INTRAVENOUS | Status: AC | PRN
  Administered 2017-07-26: 500 [IU]
  Filled 2017-07-26: qty 5

## 2017-07-26 MED ORDER — OXYCODONE HCL 10 MG PO TABS: 10.0000 mg | ORAL_TABLET | Freq: Four times a day (QID) | ORAL | 0 refills | Status: DC | PRN

## 2017-07-26 NOTE — Progress Notes (Signed)
Subjective:    Patient ID: Melissa Sullivan, female    DOB: 14-May-1956, 61 y.o.   MRN: [id number]  HPI She had window fall on her left long finger on 07-21-17.  She sustained an open fracture of the tip of the finger.  She was seen in the ER and had primary repair.  She is on antibiotics.  She has no other injury.  She is doing well.  She has hand splint.    I have reviewed the ER records, X-rays and xray reports.  She smokes and is willing to cut back.   Review of Systems  HENT: Negative for congestion.   Respiratory: Negative for cough and shortness of breath.   Cardiovascular: Negative for chest pain and leg swelling.  Endocrine: Negative for cold intolerance.  Musculoskeletal: Positive for arthralgias.  Allergic/Immunologic: Negative for environmental allergies.   Past Medical History:  Diagnosis Date  . Adrenal mass, right ([place]) 07/28/2014  . Anemia   . Bone metastases ([place]) 04/05/2016  . Cancer ([place])    lung  right  . Diabetes mellitus without complication ([place])   . GERD (gastroesophageal reflux disease)   . Hyperlipidemia   . Hypertension   . Hypothyroidism due to medication 01/30/2017  . Lung mass 07/28/2014  . Reflux     Past Surgical History:  Procedure Laterality Date  . AMPUTATION Right 02/22/2017   Procedure: PARTIAL AMPUTATION RIGHT GREAT TOE;  Surgeon: Caprice Beaver, DPM;  Location: [facility];  Service: Podiatry;  Laterality: Right;  . APPENDECTOMY    . ESOPHAGOGASTRODUODENOSCOPY N/A 12/09/2014   [id number] esophageal stricture/mild-to-noderate erosive gastritis. negative H.pylori  . FLEXIBLE SIGMOIDOSCOPY  2011   Dr. [name]: hyperplastic polyp  . LUNG BIOPSY Right 07/2014   CT guided  . MALONEY DILATION N/A 12/09/2014   Procedure: [name] DILATION;  Surgeon: [name], MD;  Location: [facility];  Service: Endoscopy;  Laterality: N/A;  . PORTACATH PLACEMENT Left 09/01/2014  . SAVORY DILATION N/A 12/09/2014   Procedure: SAVORY DILATION;  Surgeon: [name], MD;  Location: [facility];  Service: Endoscopy;  Laterality: N/A;    Current Outpatient Prescriptions on File Prior to Visit  Medication Sig Dispense Refill  . Calcium Carb-Cholecalciferol (CALCIUM 1000 + D PO) Take 1 tablet by mouth daily.    . cephALEXin (KEFLEX) 500 MG capsule Take 1 capsule (500 mg total) by mouth 3 (three) times daily. 30 capsule 0  . dronabinol (MARINOL) 2.5 MG capsule Take 1 capsule (2.5 mg total) by mouth 2 (two) times daily before lunch and supper. 60 capsule 1  . Fluticasone-Salmeterol (ADVAIR DISKUS) 500-50 MCG/DOSE AEPB Inhale 1 puff into the lungs 2 (two) times daily. (Patient taking differently: Inhale 1 puff into the lungs 2 (two) times daily as needed (for respiratory issues.). ) 60 each 6  . glucose blood test strip Use as instructed (Patient taking differently: 1 each by Other route every other day. Use as instructed) 100 each 12  . ibuprofen (ADVIL,MOTRIN) 200 MG tablet Take 400 mg by mouth every 8 (eight) hours as needed (for pain.).    Marland Kitchen KLOR-CON M20 20 MEQ tablet TAKE 1 TABLET BY MOUTH TWICE DAILY 60 tablet 3  . levothyroxine (SYNTHROID, LEVOTHROID) 25 MCG tablet TAKE 1 TABLET BY MOUTH ONCE DAILY BEFORE BREAKFAST 30 tablet 1  . lidocaine-prilocaine (EMLA) cream Apply a quarter size amount to port site 1 hour prior to chemo. Do not rub in. Cover with plastic wrap. 30 g 5  .  magic mouthwash SOLN Take 5 mLs by mouth 4 (four) times daily as needed for mouth pain. 240 mL 2  . metoCLOPramide (REGLAN) 10 MG tablet TAKE ONE TABLET BY MOUTH 4 TIMES DAILY 120 tablet 0  . Nivolumab (OPDIVO IV) Inject 240 mg into the vein every 14 (fourteen) days.     Marland Kitchen NUTRITIONAL SUPPLEMENT LIQD Please provide patient with a nutrtional supplement 30 Can 6  . omeprazole (PRILOSEC) 40 MG capsule Take 1 capsule (40 mg total) by mouth daily. 90 capsule 3  . ondansetron (ZOFRAN) 8 MG tablet Take 1 tablet (8 mg total) by mouth every 8 (eight) hours as needed for nausea or  vomiting. 60 tablet 1  . ONE TOUCH LANCETS MISC Use as directed (Patient taking differently: 1 each by Other route every other day. Use as directed) 200 each 6  . Oxycodone HCl 10 MG TABS Take 1-2 tablets (10-20 mg total) by mouth every 6 (six) hours as needed (pain.). 100 tablet 0  . senna (SENOKOT) 8.6 MG tablet Take 2 tablets by mouth at bedtime as needed for constipation. Reported on 02/24/2016    . simvastatin (ZOCOR) 40 MG tablet TAKE ONE TABLET BY MOUTH ONCE DAILY IN THE MORNING 30 tablet 6  . triamcinolone (KENALOG) 0.025 % cream Apply 1 application topically 2 (two) times daily as needed (for dry/itching skin.). Reported on 02/24/2016     Current Facility-Administered Medications on File Prior to Visit  Medication Dose Route Frequency Provider Last Rate Last Dose  . sodium chloride 0.9 % injection 10 mL  10 mL Intracatheter PRN Twana First, MD   10 mL at 04/05/17 1050    Social History   Social History  . Marital status: Married    Spouse name: N/A  . Number of children: N/A  . Years of education: N/A   Occupational History  . Not on file.   Social History Main Topics  . Smoking status: Light Tobacco Smoker    Packs/day: 0.30    Years: 20.00    Types: Cigarettes  . Smokeless tobacco: Never Used  . Alcohol use No  . Drug use: No  . Sexual activity: Not on file   Other Topics Concern  . Not on file   Social History Narrative  . No narrative on file    Family History  Problem Relation Age of Onset  . Cancer Sister   . Colon cancer Neg Hx     BP 105/68   Pulse 85   Temp 97.7 F (36.5 C)   Ht 5\' 1"  (1.549 m)   Wt 98 lb (44.5 kg)   BMI 18.52 kg/m      Objective:   Physical Exam  Constitutional: She is oriented to person, place, and time. She appears well-developed and well-nourished.  HENT:  Head: Normocephalic and atraumatic.  Eyes: Pupils are equal, round, and reactive to light. Conjunctivae and EOM are normal.  Neck: Normal range of motion. Neck  supple.  Cardiovascular: Normal rate, regular rhythm and intact distal pulses.   Pulmonary/Chest: Effort normal.  Abdominal: Soft.  Musculoskeletal: She exhibits tenderness (Left long finger with sutures in place over nail.  No redness or purulence.  NV intact.  Wound looks good.  Other fingers negative.).  Neurological: She is alert and oriented to person, place, and time. She displays normal reflexes. No cranial nerve deficit. She exhibits normal muscle tone. Coordination normal.  Skin: Skin is warm and dry.  Psychiatric: She has a normal mood and affect.  Her behavior is normal. Judgment and thought content normal.  Vitals reviewed.         Assessment & Plan:   Encounter Diagnoses  Name Primary?  . Open displaced fracture of distal phalanx of left middle finger, initial encounter Yes  . Cigarette nicotine dependence without complication    A new dressing and splint applied.  Return in one week. Continue antibiotics. Call if any problem.  Precautions discussed.   Electronically Signed Sanjuana Kava, MD 9/5/201810:15 AM

## 2017-07-26 NOTE — Progress Notes (Signed)
Tolerated infusion w/o adverse reaction.  Alert, in no distress.  VSS.  Discharged ambulatory in c/o family.

## 2017-08-02 ENCOUNTER — Ambulatory Visit (INDEPENDENT_AMBULATORY_CARE_PROVIDER_SITE_OTHER): Payer: 59 | Admitting: Orthopaedic Surgery

## 2017-08-02 DIAGNOSIS — S62633D Displaced fracture of distal phalanx of left middle finger, subsequent encounter for fracture with routine healing: Secondary | ICD-10-CM

## 2017-08-02 NOTE — Progress Notes (Signed)
Patient ON:Melissa Sullivan Sonny Dandy, female DOB:[birth year], 61 y.o. XLK:[id number]  Chief Complaint  Patient presents with  . Follow-up    Fracture Care    HPI  Melissa Sullivan is a 61 y.o. female who has open fracture of the left long finger.  She had sutures placed in the ER about 10 days ago.  She has no redness.  She has no discharge.  NV intact.  She has been using her splint. HPI  There is no height or weight on file to calculate BMI.  ROS  Review of Systems  HENT: Negative for congestion.   Respiratory: Negative for cough and shortness of breath.   Cardiovascular: Negative for chest pain and leg swelling.  Endocrine: Negative for cold intolerance.  Musculoskeletal: Positive for arthralgias.  Allergic/Immunologic: Negative for environmental allergies.    Past Medical History:  Diagnosis Date  . Adrenal mass, right ([place]) 07/28/2014  . Anemia   . Bone metastases ([place]) 04/05/2016  . Cancer ([place])    lung  right  . Diabetes mellitus without complication ([place])   . GERD (gastroesophageal reflux disease)   . Hyperlipidemia   . Hypertension   . Hypothyroidism due to medication 01/30/2017  . Lung mass 07/28/2014  . Reflux     Past Surgical History:  Procedure Laterality Date  . AMPUTATION Right 02/22/2017   Procedure: PARTIAL AMPUTATION RIGHT GREAT TOE;  Surgeon: Caprice Beaver, DPM;  Location: [facility];  Service: Podiatry;  Laterality: Right;  . APPENDECTOMY    . ESOPHAGOGASTRODUODENOSCOPY N/A 12/09/2014   [id number] esophageal stricture/mild-to-noderate erosive gastritis. negative H.pylori  . FLEXIBLE SIGMOIDOSCOPY  2011   Dr. [name]: hyperplastic polyp  . LUNG BIOPSY Right 07/2014   CT guided  . MALONEY DILATION N/A 12/09/2014   Procedure: [name] DILATION;  Surgeon: [name], MD;  Location[facility];  Service: Endoscopy;  Laterality: N/A;  . PORTACATH PLACEMENT Left 09/01/2014  . SAVORY DILATION N/A 12/09/2014   Procedure: SAVORY DILATION;  Surgeon: [name], MD;   [facility];  Service: Endoscopy;  Laterality: N/A;    Family History  Problem Relation Age of Onset  . Cancer Sister   . Colon cancer Neg Hx     Social History Social History  Substance Use Topics  . Smoking status: Light Tobacco Smoker    Packs/day: 0.30    Years: 20.00    Types: Cigarettes  . Smokeless tobacco: Never Used  . Alcohol use No    No Known Allergies  Current Outpatient Prescriptions  Medication Sig Dispense Refill  . Calcium Carb-Cholecalciferol (CALCIUM 1000 + D PO) Take 1 tablet by mouth daily.    . cephALEXin (KEFLEX) 500 MG capsule Take 1 capsule (500 mg total) by mouth 3 (three) times daily. 30 capsule 0  . dronabinol (MARINOL) 2.5 MG capsule Take 1 capsule (2.5 mg total) by mouth 2 (two) times daily before lunch and supper. 60 capsule 1  . Fluticasone-Salmeterol (ADVAIR DISKUS) 500-50 MCG/DOSE AEPB Inhale 1 puff into the lungs 2 (two) times daily. (Patient taking differently: Inhale 1 puff into the lungs 2 (two) times daily as needed (for respiratory issues.). ) 60 each 6  . glucose blood test strip Use as instructed (Patient taking differently: 1 each by Other route every other day. Use as instructed) 100 each 12  . ibuprofen (ADVIL,MOTRIN) 200 MG tablet Take 400 mg by mouth every 8 (eight) hours as needed (for pain.).    Marland Kitchen KLOR-CON M20 20 MEQ tablet TAKE 1  TABLET BY MOUTH TWICE DAILY 60 tablet 3  . levothyroxine (SYNTHROID, LEVOTHROID) 25 MCG tablet TAKE 1 TABLET BY MOUTH ONCE DAILY BEFORE BREAKFAST 30 tablet 1  . lidocaine-prilocaine (EMLA) cream Apply a quarter size amount to port site 1 hour prior to chemo. Do not rub in. Cover with plastic wrap. 30 g 5  . magic mouthwash SOLN Take 5 mLs by mouth 4 (four) times daily as needed for mouth pain. 240 mL 2  . metoCLOPramide (REGLAN) 10 MG tablet TAKE ONE TABLET BY MOUTH 4 TIMES DAILY 120 tablet 0  . Nivolumab (OPDIVO IV) Inject 240 mg into the vein every 14 (fourteen) days.     Marland Kitchen NUTRITIONAL  SUPPLEMENT LIQD Please provide patient with a nutrtional supplement 30 Can 6  . omeprazole (PRILOSEC) 40 MG capsule Take 1 capsule (40 mg total) by mouth daily. 90 capsule 3  . ondansetron (ZOFRAN) 8 MG tablet Take 1 tablet (8 mg total) by mouth every 8 (eight) hours as needed for nausea or vomiting. 60 tablet 1  . ONE TOUCH LANCETS MISC Use as directed (Patient taking differently: 1 each by Other route every other day. Use as directed) 200 each 6  . Oxycodone HCl 10 MG TABS Take 1-2 tablets (10-20 mg total) by mouth every 6 (six) hours as needed (pain.). 100 tablet 0  . senna (SENOKOT) 8.6 MG tablet Take 2 tablets by mouth at bedtime as needed for constipation. Reported on 02/24/2016    . simvastatin (ZOCOR) 40 MG tablet TAKE ONE TABLET BY MOUTH ONCE DAILY IN THE MORNING 30 tablet 6  . triamcinolone (KENALOG) 0.025 % cream Apply 1 application topically 2 (two) times daily as needed (for dry/itching skin.). Reported on 02/24/2016     No current facility-administered medications for this visit.    Facility-Administered Medications Ordered in Other Visits  Medication Dose Route Frequency Provider Last Rate Last Dose  . sodium chloride 0.9 % injection 10 mL  10 mL Intracatheter PRN Twana First, MD   10 mL at 04/05/17 1050     Physical Exam  There were no vitals taken for this visit.  Constitutional: overall normal hygiene, normal nutrition, well developed, normal grooming, normal body habitus. Assistive device:splint left long finger  Musculoskeletal: gait and station Limp none, muscle tone and strength are normal, no tremors or atrophy is present.  .  Neurological: coordination overall normal.  Deep tendon reflex/nerve stretch intact.  Sensation normal.  Cranial nerves II-XII intact.   Skin:   Normal overall no scars, lesions, ulcers or rashes. No psoriasis.  Psychiatric: Alert and oriented x 3.  Recent memory intact, remote memory unclear.  Normal mood and affect. Well groomed.  Good eye  contact.  Cardiovascular: overall no swelling, no varicosities, no edema bilaterally, normal temperatures of the legs and arms, no clubbing, cyanosis and good capillary refill.  Lymphatic: palpation is normal.  All other systems reviewed and are negative   The left long finger has sutures in place including the nail.  NV intact.  There is no redness, no discharge.  Wound looks good.  The patient has been educated about the nature of the problem(s) and counseled on treatment options.  The patient appeared to understand what I have discussed and is in agreement with it.  Encounter Diagnosis  Name Primary?  . Open displaced fracture of distal phalanx of left middle finger with routine healing, subsequent encounter Yes   The sutures were removed of the left long finger.  A new aluminum  splint was applied to the finger and instructions given.  PLAN Call if any problems.  Precautions discussed.  Continue current medications.   Return to clinic 3 weeks   X-rays on return.  Electronically Signed Sanjuana Kava, MD 9/12/20182:11 PM

## 2017-08-09 ENCOUNTER — Encounter (HOSPITAL_COMMUNITY): Payer: Self-pay | Admitting: Oncology

## 2017-08-09 ENCOUNTER — Encounter (HOSPITAL_COMMUNITY): Payer: 59

## 2017-08-09 ENCOUNTER — Encounter (HOSPITAL_BASED_OUTPATIENT_CLINIC_OR_DEPARTMENT_OTHER): Payer: 59 | Admitting: Oncology

## 2017-08-09 ENCOUNTER — Encounter (HOSPITAL_BASED_OUTPATIENT_CLINIC_OR_DEPARTMENT_OTHER): Payer: 59

## 2017-08-09 VITALS — BP 110/58 | HR 81 | Temp 98.7°F | Resp 18 | Wt 99.0 lb

## 2017-08-09 DIAGNOSIS — C3411 Malignant neoplasm of upper lobe, right bronchus or lung: Secondary | ICD-10-CM

## 2017-08-09 DIAGNOSIS — C7971 Secondary malignant neoplasm of right adrenal gland: Secondary | ICD-10-CM

## 2017-08-09 DIAGNOSIS — C7951 Secondary malignant neoplasm of bone: Secondary | ICD-10-CM

## 2017-08-09 DIAGNOSIS — Z5112 Encounter for antineoplastic immunotherapy: Secondary | ICD-10-CM

## 2017-08-09 DIAGNOSIS — E278 Other specified disorders of adrenal gland: Secondary | ICD-10-CM

## 2017-08-09 DIAGNOSIS — C7972 Secondary malignant neoplasm of left adrenal gland: Secondary | ICD-10-CM | POA: Diagnosis not present

## 2017-08-09 LAB — CBC WITH DIFFERENTIAL/PLATELET
BASOS ABS: 0 10*3/uL (ref 0.0–0.1)
BASOS PCT: 0 %
Eosinophils Absolute: 0.3 10*3/uL (ref 0.0–0.7)
Eosinophils Relative: 3 %
HEMATOCRIT: 36.6 % (ref 36.0–46.0)
HEMOGLOBIN: 11.8 g/dL — AB (ref 12.0–15.0)
LYMPHS PCT: 31 %
Lymphs Abs: 2.6 10*3/uL (ref 0.7–4.0)
MCH: 29.9 pg (ref 26.0–34.0)
MCHC: 32.2 g/dL (ref 30.0–36.0)
MCV: 92.7 fL (ref 78.0–100.0)
MONO ABS: 0.7 10*3/uL (ref 0.1–1.0)
Monocytes Relative: 9 %
NEUTROS ABS: 4.8 10*3/uL (ref 1.7–7.7)
NEUTROS PCT: 57 %
Platelets: 274 10*3/uL (ref 150–400)
RBC: 3.95 MIL/uL (ref 3.87–5.11)
RDW: 15.7 % — ABNORMAL HIGH (ref 11.5–15.5)
WBC: 8.4 10*3/uL (ref 4.0–10.5)

## 2017-08-09 LAB — COMPREHENSIVE METABOLIC PANEL
ALBUMIN: 4.1 g/dL (ref 3.5–5.0)
ALT: 13 U/L — AB (ref 14–54)
AST: 21 U/L (ref 15–41)
Alkaline Phosphatase: 58 U/L (ref 38–126)
Anion gap: 6 (ref 5–15)
BILIRUBIN TOTAL: 0.3 mg/dL (ref 0.3–1.2)
BUN: 16 mg/dL (ref 6–20)
CALCIUM: 9.8 mg/dL (ref 8.9–10.3)
CO2: 26 mmol/L (ref 22–32)
Chloride: 102 mmol/L (ref 101–111)
Creatinine, Ser: 1.44 mg/dL — ABNORMAL HIGH (ref 0.44–1.00)
GFR calc Af Amer: 44 mL/min — ABNORMAL LOW (ref >60)
GFR, EST NON AFRICAN AMERICAN: 38 mL/min — AB (ref >60)
GLUCOSE: 103 mg/dL — AB (ref 65–99)
Potassium: 4.7 mmol/L (ref 3.5–5.1)
Sodium: 134 mmol/L — ABNORMAL LOW (ref 135–145)
TOTAL PROTEIN: 7.7 g/dL (ref 6.5–8.1)

## 2017-08-09 LAB — TSH: TSH: 3.355 u[IU]/mL (ref 0.350–4.500)

## 2017-08-09 MED ORDER — SODIUM CHLORIDE 0.9 % IV SOLN
240.0000 mg | Freq: Once | INTRAVENOUS | Status: AC
  Administered 2017-08-09: 240 mg via INTRAVENOUS
  Filled 2017-08-09: qty 24

## 2017-08-09 MED ORDER — SODIUM CHLORIDE 0.9 % IJ SOLN
10.0000 mL | INTRAMUSCULAR | Status: DC | PRN
  Administered 2017-08-09: 10 mL
  Filled 2017-08-09: qty 10

## 2017-08-09 MED ORDER — SODIUM CHLORIDE 0.9 % IV SOLN
Freq: Once | INTRAVENOUS | Status: AC
  Administered 2017-08-09: 11:00:00 via INTRAVENOUS

## 2017-08-09 MED ORDER — DENOSUMAB 120 MG/1.7ML ~~LOC~~ SOLN
120.0000 mg | Freq: Once | SUBCUTANEOUS | Status: AC
  Administered 2017-08-09: 120 mg via SUBCUTANEOUS
  Filled 2017-08-09: qty 1.7

## 2017-08-09 MED ORDER — HEPARIN SOD (PORK) LOCK FLUSH 100 UNIT/ML IV SOLN
500.0000 [IU] | Freq: Once | INTRAVENOUS | Status: AC | PRN
  Administered 2017-08-09: 500 [IU]

## 2017-08-09 NOTE — Progress Notes (Signed)
Patient labs reviewed with MD. Kidney function and liver enzymes slightly elevated. MD aware. Proceed with treatment. Will give Xgeva today . Patient did have her dental work done in august.   Melissa Sullivan presents today for injection per MD orders. Xgeva 120 mg administered SQ in right Abdomen. Administration without incident. Patient tolerated well.  Treatment given per orders. Patient tolerated it well without problems. Vitals stable and discharged home from clinic ambulatory. Follow up as scheduled.

## 2017-08-09 NOTE — Patient Instructions (Signed)
[facility] Discharge Instructions for Patients Receiving Chemotherapy   Beginning January 23rd 2017 lab work for the Cancer Center will be done in the  Main lab at  on 1st floor. If you have a lab appointment with the Cancer Center please come in thru the  Main Entrance and check in at the main information desk   Today you received the following chemotherapy agents   To help prevent nausea and vomiting after your treatment, we encourage you to take your nausea medication     If you develop nausea and vomiting, or diarrhea that is not controlled by your medication, call the clinic.  The clinic phone number is [phone]. Office hours are Monday-Friday 8:30am-5:00pm.  BELOW ARE SYMPTOMS THAT SHOULD BE REPORTED IMMEDIATELY:  *FEVER GREATER THAN 101.0 F  *CHILLS WITH OR WITHOUT FEVER  NAUSEA AND VOMITING THAT IS NOT CONTROLLED WITH YOUR NAUSEA MEDICATION  *UNUSUAL SHORTNESS OF BREATH  *UNUSUAL BRUISING OR BLEEDING  TENDERNESS IN MOUTH AND THROAT WITH OR WITHOUT PRESENCE OF ULCERS  *URINARY PROBLEMS  *BOWEL PROBLEMS  UNUSUAL RASH Items with * indicate a potential emergency and should be followed up as soon as possible. If you have an emergency after office hours please contact your primary care physician or go to the nearest emergency department.  Please call the clinic during office hours if you have any questions or concerns.   You may also contact the Patient Navigator at [phone] should you have any questions or need assistance in obtaining follow up care.      Resources For Cancer Patients and their Caregivers ? American Cancer Society: Can assist with transportation, wigs, general needs, runs Look Good Feel Better.        1-888-227-6333 ? Cancer Care: Provides financial assistance, online support groups, medication/co-pay assistance.  1-800-813-HOPE (4673) ? [facility] Assists [place] cancer  patients and their families through emotional , educational and financial support.  336-427-4357 ? [place] DSS Where to apply for food stamps, Medicaid and utility assistance. 336-342-1394 ? [facility]: Transportation to medical appointments. 336-347-2287 ? Social Security Administration: May apply for disability if have a Stage IV cancer. 336-342-7796 1-800-772-1213 ? [place] [facility], Disability and Transit [facility]: Assists with nutrition, care and transit needs. 336-349-2343         

## 2017-08-09 NOTE — Progress Notes (Signed)
Melissa Squibb, MD [place] [address]  CURRENT THERAPY:Nivolumab every 2 weeks.  Xgeva every 4 weeks.  INTERVAL HISTORY: Melissa Sullivan 61 y.o. female returns for followup of Stage IV adenocarcinoma, pancoast tumor, of right lung.  (EGFR and ALK negative) complicated by right brachial plexus neuralgia with disease on presentation including bilateral adrenal metastases, retroperitoneal lymph nodes, and osseous involvement.    Cancer of upper lobe of right lung ([place])   07/28/2014 Imaging    CT chest: Large R apical mass consistent with malignancy. This is destroying the R 2nd rib with extension into adjacent soft tissue. R hilar adenopathy with R 5cm adrenal metastatic lesion.      08/01/2014 Initial Biopsy    Lung, needle/core biopsy(ies), right upper lobe - POORLY DIFFERENTIATED ADENOCARCINOMA, SEE COMMENT.      08/08/2014 PET scan    Large hypermetabolic R apical mass with evidence of direct chest wall and mediastinal invasion, right retrocrural lymphadenopathy, extensive retroperitoneal lymphadenopathy, and metastatic lesions to the adrenal glands       09/02/2014 - 11/04/2014 Chemotherapy    Cisplatin/Pemetrexed/Avastin every 21 days x 4 cycles      10/07/2014 - 10/27/2014 Radiation Therapy    Right lung apex for control of brachioplexopathy.      12/24/2014 - 02/25/2015 Chemotherapy    Alimta/Avastin every 21 days.      02/20/2015 Imaging    Increase in size of right adrenal metastasis and subjacent confluent retrocaval lymphadenopathy      02/25/2015 -  Chemotherapy    Nivolumab, zometa      05/04/2015 Imaging    CT CAP- Stable to slight decrease in the posterior right apical lesion. Stable appearance of posterior right upper rib an upper thoracic bony lesions. Slight improvement in right upper lobe tree-in-bud opacity. No new or progressive findings in...      07/28/2015 Imaging    CT CAP- Reduced size of the right apical pleural parenchymal  lesion and reduced size of the right adrenal metastatic lesion. Resolution of prior retrocrural adenopathy.  Right eccentric T1 and T2 sclerosis with sclerosis and tapering of the right second..      11/17/2015 Imaging    CT CAP- Stable soft tissue thickening in the apex of the right hemi thorax. Stable right adrenal metastasis. Nodularity along the trachea and mainstem bronchi, relatively new from 07/28/2015, favoring adherent debris.      11/18/2015 Treatment Plan Change    Zometa HELD for upcoming tooth extraction      11/24/2015 Treatment Plan Change    Zometa on hold at this time in preparation for tooth extraction in March 2017.  Zometa las given on 11/18/2015.      02/03/2016 Imaging    CT CAP- Heterogeneous right apical masslike consolidation and right adrenal metastasis are unchanged      04/06/2016 Treatment Plan Change    Zometa restarted 6 weeks out from tooth extraction (04/06/2016)      05/12/2016 Imaging    CT CAP- NED in the chest, abdomen or pelvis.  Some areas of nodularity associated with the mainstem bronchi in the left upper lobe bronchus, favored to represent adherent inspissated secretions      08/17/2016 Imaging    CT CAP- 1. Stable CTs of the chest and abdomen. No evidence of progressive metastatic disease. 2. Probable treated tumor at the right apex, right adrenal gland and T2 vertebral body, stable. 3. Fluctuating nodularity along the walls  of the trachea and mainstem bronchi, likely secretions.      12/12/2016 Imaging    Further decrease in size of treated tumor within the right apex. 2. Stable treated tumor involving the right second rib and T2 vertebra. 3. Stable right adrenal gland treated tumor. 4. Emphysema 5. Aortic atherosclerosis      01/04/2017 Imaging    MRI brain- Normal brain MRI.  No intracranial metastatic disease.      03/15/2017 Imaging    CT CAP- 1. No new or progressive metastatic disease in the chest or abdomen. 2. Stable treated  tumor in the apical right upper lobe. Stable treated right posterior second rib and right T2 vertebral lesions. Stable treated right adrenal metastasis. 3. Aortic atherosclerosis. 4. Moderate emphysema with mild diffuse bronchial wall thickening, suggesting COPD.      06/12/2017 Imaging    CT CAP 1. Similar appearance of treated primary within the right apex. 2. Similar areas of sclerosis within the right second rib, T2, and less so T1 vertebral bodies. These are most consistent with treated metastasis. 3. Similar right adrenal treated metastasis. 4. No evidence of new or progressive disease. 5. Similar right and progressive left areas of bronchial wall thickening and probable mucoid impaction. Correlate with interval infectious symptoms. 6.  Emphysema (ICD10-J43.9). 7. Coronary artery atherosclerosis. Aortic Atherosclerosis (ICD10-I70.0).       Patient presents today for continued follow-up and opdivo treatment. She stated on 07/21/2017 she had a crush injury to her left middle finger after he got caught in her car door. She went to the ED and had an x-ray performed which demonstrated fracture in the third distal phalangeal tuft. She states that it has been healing well the patient denies any pain at this time. Otherwise she states she's been doing well. She's been eating well. She is gaining some weight. She denies any chest pain, shortness breath, abdominal pain, nausea, vomiting, diarrhea, focal weakness.   Review of Systems  Constitutional: Negative.  Negative for chills, fever and weight loss.  HENT: Negative.  Negative for hearing loss, sore throat and tinnitus.   Eyes: Negative.  Negative for blurred vision, photophobia and discharge.  Respiratory: Negative.  Negative for cough, hemoptysis, shortness of breath and wheezing.   Cardiovascular: Negative.  Negative for chest pain, palpitations, orthopnea, claudication and leg swelling.  Gastrointestinal: Negative.  Negative for  abdominal pain, blood in stool, constipation, diarrhea, melena, nausea and vomiting.  Genitourinary: Negative.  Negative for dysuria and hematuria.  Musculoskeletal: Negative.  Negative for back pain, joint pain and myalgias.  Skin: Negative.  Negative for itching and rash.  Neurological: Negative.  Negative for dizziness, weakness and headaches.  Endo/Heme/Allergies: Negative.  Negative for environmental allergies and polydipsia. Does not bruise/bleed easily.  Psychiatric/Behavioral: Negative for depression. The patient is not nervous/anxious and does not have insomnia.     Past Medical History:  Diagnosis Date  . Adrenal mass, right ([place]) 07/28/2014  . Anemia   . Bone metastases ([place]) 04/05/2016  . Cancer ([place])    lung  right  . Diabetes mellitus without complication ([place])   . GERD (gastroesophageal reflux disease)   . Hyperlipidemia   . Hypertension   . Hypothyroidism due to medication 01/30/2017  . Lung mass 07/28/2014  . Reflux     Past Surgical History:  Procedure Laterality Date  . AMPUTATION Right 02/22/2017   Procedure: PARTIAL AMPUTATION RIGHT GREAT TOE;  Surgeon: Caprice Beaver, DPM;  Location: [facility];  Service: Podiatry;  Laterality: Right;  .  APPENDECTOMY    . ESOPHAGOGASTRODUODENOSCOPY N/A 12/09/2014   FKC:LEXNTZ esophageal stricture/mild-to-noderate erosive gastritis. negative H.pylori  . FLEXIBLE SIGMOIDOSCOPY  2011   Dr. [name]: hyperplastic polyp  . LUNG BIOPSY Right 07/2014   CT guided  . MALONEY DILATION N/A 12/09/2014   Procedure: [name] DILATION;  Surgeon: [name], MD;  Location: [facility];  Service: Endoscopy;  Laterality: N/A;  . PORTACATH PLACEMENT Left 09/01/2014  . SAVORY DILATION N/A 12/09/2014   Procedure: SAVORY DILATION;  Surgeon: [name], MD;  Location: [facility];  Service: Endoscopy;  Laterality: N/A;    Family History  Problem Relation Age of Onset  . Cancer Sister   . Colon cancer Neg Hx     Social History   Social  History  . Marital status: Married    Spouse name: N/A  . Number of children: N/A  . Years of education: N/A   Social History Main Topics  . Smoking status: Light Tobacco Smoker    Packs/day: 0.30    Years: 20.00    Types: Cigarettes  . Smokeless tobacco: Never Used  . Alcohol use No  . Drug use: No  . Sexual activity: Not Asked   Other Topics Concern  . None   Social History Narrative  . None     PHYSICAL EXAMINATION  ECOG PERFORMANCE STATUS: 0 - Asymptomatic   Physical Exam  Constitutional: She is oriented to person, place, and time and well-developed, well-nourished, and in no distress. No distress.  HENT:  Head: Normocephalic and atraumatic.  Mouth/Throat: No oropharyngeal exudate.  Eyes: Pupils are equal, round, and reactive to light. Conjunctivae are normal. No scleral icterus.  Neck: Normal range of motion. Neck supple. No JVD present.  Cardiovascular: Normal rate, regular rhythm and normal heart sounds.  Exam reveals no gallop and no friction rub.   No murmur heard. Pulmonary/Chest: Effort normal and breath sounds normal. No respiratory distress. She has no wheezes. She has no rales.  Abdominal: Soft. Bowel sounds are normal. She exhibits no distension. There is no tenderness. There is no guarding.  Musculoskeletal: She exhibits no edema or tenderness.  Lymphadenopathy:    She has no cervical adenopathy.  Neurological: She is alert and oriented to person, place, and time. No cranial nerve deficit.  Skin: Skin is warm and dry. No rash noted. No erythema. No pallor.  Psychiatric: Affect and judgment normal.    LABORATORY DATA: CBC    Component Value Date/Time   WBC 8.4 08/09/2017 0959   RBC 3.95 08/09/2017 0959   HGB 11.8 (L) 08/09/2017 0959   HCT 36.6 08/09/2017 0959   PLT 274 08/09/2017 0959   MCV 92.7 08/09/2017 0959   MCH 29.9 08/09/2017 0959   MCHC 32.2 08/09/2017 0959   RDW 15.7 (H) 08/09/2017 0959   LYMPHSABS 2.6 08/09/2017 0959   MONOABS 0.7  08/09/2017 0959   EOSABS 0.3 08/09/2017 0959   BASOSABS 0.0 08/09/2017 0959      Chemistry      Component Value Date/Time   NA 134 (L) 08/09/2017 0959   K 4.7 08/09/2017 0959   CL 102 08/09/2017 0959   CO2 26 08/09/2017 0959   BUN 16 08/09/2017 0959   CREATININE 1.44 (H) 08/09/2017 0959      Component Value Date/Time   CALCIUM 9.8 08/09/2017 0959   CALCIUM 9.7 06/03/2015 1000   ALKPHOS 58 08/09/2017 0959   AST 21 08/09/2017 0959   ALT 13 (L) 08/09/2017 0959   BILITOT  0.3 08/09/2017 0959     Lab Results  Component Value Date   TSH 3.355 08/09/2017     PENDING LABS:   RADIOGRAPHIC STUDIES:  Dg Finger Middle Left  Result Date: 07/21/2017 CLINICAL DATA:  Crush injury in a car door tonight. EXAM: LEFT MIDDLE FINGER 2+V COMPARISON:  None. FINDINGS: Mildly comminuted transverse fracture across the tuft of the third distal phalanx with overlying soft tissue deformity. IMPRESSION: Third distal phalangeal tuft fracture. Electronically Signed   By: Andreas Newport M.D.   On: 07/21/2017 22:59     PATHOLOGY:    ASSESSMENT AND PLAN:  Stage IV adenocarcinoma, pancoast tumor, of right lung.  PLAN:  Clinically stable. She has stable disease on her last scans in July 2018. Plan to repeat her scans in November. Labs reviewed, proceed with opdivo today. Tolerating opdivo well. Continue with Nivolumab every 2 weeks and Xgeva every 4 weeks.   RTC in 4 weeks for follow up.  All questions were answered. The patient knows to call the clinic with any problems, questions or concerns. We can certainly see the patient much sooner if necessary.   This note is electronically signed by: Twana First, MD 08/09/2017 11:53 AM

## 2017-08-23 ENCOUNTER — Encounter (HOSPITAL_COMMUNITY): Payer: 59 | Attending: Oncology

## 2017-08-23 ENCOUNTER — Ambulatory Visit (INDEPENDENT_AMBULATORY_CARE_PROVIDER_SITE_OTHER): Payer: 59

## 2017-08-23 ENCOUNTER — Encounter (HOSPITAL_COMMUNITY): Payer: Self-pay

## 2017-08-23 ENCOUNTER — Encounter (HOSPITAL_BASED_OUTPATIENT_CLINIC_OR_DEPARTMENT_OTHER): Payer: 59

## 2017-08-23 ENCOUNTER — Ambulatory Visit (INDEPENDENT_AMBULATORY_CARE_PROVIDER_SITE_OTHER): Payer: 59 | Admitting: Orthopaedic Surgery

## 2017-08-23 ENCOUNTER — Encounter: Payer: Self-pay | Admitting: Orthopaedic Surgery

## 2017-08-23 VITALS — BP 97/61 | HR 78 | Temp 98.5°F | Resp 18 | Wt 99.8 lb

## 2017-08-23 VITALS — BP 110/69 | HR 81 | Temp 98.2°F | Ht 59.0 in | Wt 100.0 lb

## 2017-08-23 DIAGNOSIS — C7971 Secondary malignant neoplasm of right adrenal gland: Secondary | ICD-10-CM

## 2017-08-23 DIAGNOSIS — S62633D Displaced fracture of distal phalanx of left middle finger, subsequent encounter for fracture with routine healing: Secondary | ICD-10-CM

## 2017-08-23 DIAGNOSIS — Z5112 Encounter for antineoplastic immunotherapy: Secondary | ICD-10-CM | POA: Diagnosis not present

## 2017-08-23 DIAGNOSIS — E278 Other specified disorders of adrenal gland: Secondary | ICD-10-CM

## 2017-08-23 DIAGNOSIS — C7951 Secondary malignant neoplasm of bone: Secondary | ICD-10-CM

## 2017-08-23 DIAGNOSIS — C3411 Malignant neoplasm of upper lobe, right bronchus or lung: Secondary | ICD-10-CM

## 2017-08-23 DIAGNOSIS — C7972 Secondary malignant neoplasm of left adrenal gland: Secondary | ICD-10-CM | POA: Diagnosis not present

## 2017-08-23 LAB — CBC WITH DIFFERENTIAL/PLATELET
BASOS PCT: 0 %
Basophils Absolute: 0 10*3/uL (ref 0.0–0.1)
EOS ABS: 0.3 10*3/uL (ref 0.0–0.7)
EOS PCT: 3 %
HCT: 36.5 % (ref 36.0–46.0)
Hemoglobin: 12.1 g/dL (ref 12.0–15.0)
LYMPHS ABS: 2.5 10*3/uL (ref 0.7–4.0)
Lymphocytes Relative: 32 %
MCH: 30.5 pg (ref 26.0–34.0)
MCHC: 33.2 g/dL (ref 30.0–36.0)
MCV: 91.9 fL (ref 78.0–100.0)
MONO ABS: 0.7 10*3/uL (ref 0.1–1.0)
MONOS PCT: 9 %
NEUTROS PCT: 56 %
Neutro Abs: 4.3 10*3/uL (ref 1.7–7.7)
PLATELETS: 266 10*3/uL (ref 150–400)
RBC: 3.97 MIL/uL (ref 3.87–5.11)
RDW: 15.5 % (ref 11.5–15.5)
WBC: 7.9 10*3/uL (ref 4.0–10.5)

## 2017-08-23 LAB — COMPREHENSIVE METABOLIC PANEL
ALBUMIN: 4 g/dL (ref 3.5–5.0)
ALK PHOS: 73 U/L (ref 38–126)
ALT: 11 U/L — ABNORMAL LOW (ref 14–54)
ANION GAP: 6 (ref 5–15)
AST: 18 U/L (ref 15–41)
BUN: 21 mg/dL — ABNORMAL HIGH (ref 6–20)
CALCIUM: 10.2 mg/dL (ref 8.9–10.3)
CO2: 27 mmol/L (ref 22–32)
Chloride: 102 mmol/L (ref 101–111)
Creatinine, Ser: 1.42 mg/dL — ABNORMAL HIGH (ref 0.44–1.00)
GFR calc non Af Amer: 39 mL/min — ABNORMAL LOW (ref >60)
GFR, EST AFRICAN AMERICAN: 45 mL/min — AB (ref >60)
GLUCOSE: 99 mg/dL (ref 65–99)
POTASSIUM: 4.9 mmol/L (ref 3.5–5.1)
SODIUM: 135 mmol/L (ref 135–145)
Total Bilirubin: 0.2 mg/dL — ABNORMAL LOW (ref 0.3–1.2)
Total Protein: 7.6 g/dL (ref 6.5–8.1)

## 2017-08-23 LAB — TSH: TSH: 2.755 u[IU]/mL (ref 0.350–4.500)

## 2017-08-23 MED ORDER — NIVOLUMAB CHEMO INJECTION 240 MG/24ML
240.0000 mg | Freq: Once | INTRAVENOUS | Status: AC
  Administered 2017-08-23: 240 mg via INTRAVENOUS
  Filled 2017-08-23: qty 24

## 2017-08-23 MED ORDER — SODIUM CHLORIDE 0.9 % IJ SOLN
10.0000 mL | INTRAMUSCULAR | Status: DC | PRN
  Administered 2017-08-23: 10 mL
  Filled 2017-08-23: qty 10

## 2017-08-23 MED ORDER — SODIUM CHLORIDE 0.9 % IV SOLN
Freq: Once | INTRAVENOUS | Status: AC
  Administered 2017-08-23: 12:00:00 via INTRAVENOUS

## 2017-08-23 MED ORDER — HEPARIN SOD (PORK) LOCK FLUSH 100 UNIT/ML IV SOLN
500.0000 [IU] | Freq: Once | INTRAVENOUS | Status: AC | PRN
  Administered 2017-08-23: 500 [IU]

## 2017-08-23 NOTE — Patient Instructions (Signed)
[facility] Discharge Instructions for Patients Receiving Chemotherapy   Beginning January 23rd 2017 lab work for the [facility] will be done in the  Main lab at [facility] on 1st floor. If you have a lab appointment with the [place] please come in thru the  Main Entrance and check in at the main information desk   Today you received the following chemotherapy agents Opdivo. Follow-up as scheduled. Call clinic for any questions or concerns  To help prevent nausea and vomiting after your treatment, we encourage you to take your nausea medication   If you develop nausea and vomiting, or diarrhea that is not controlled by your medication, call the clinic.  The clinic phone number is [phone]. Office hours are Monday-Friday 8:30am-5:00pm.  BELOW ARE SYMPTOMS THAT SHOULD BE REPORTED IMMEDIATELY:  *FEVER GREATER THAN 101.0 F  *CHILLS WITH OR WITHOUT FEVER  NAUSEA AND VOMITING THAT IS NOT CONTROLLED WITH YOUR NAUSEA MEDICATION  *UNUSUAL SHORTNESS OF BREATH  *UNUSUAL BRUISING OR BLEEDING  TENDERNESS IN MOUTH AND THROAT WITH OR WITHOUT PRESENCE OF ULCERS  *URINARY PROBLEMS  *BOWEL PROBLEMS  UNUSUAL RASH Items with * indicate a potential emergency and should be followed up as soon as possible. If you have an emergency after office hours please contact your primary care physician or go to the nearest emergency department.  Please call the clinic during office hours if you have any questions or concerns.   You may also contact the Patient Navigator at [phone] should you have any questions or need assistance in obtaining follow up care.      Resources For Cancer Patients and their Caregivers ? American Cancer Society: Can assist with transportation, wigs, general needs, runs Look Good Feel Better.        316-501-4829 ? Cancer Care: Provides financial assistance, online support groups, medication/co-pay assistance.  1-800-813-HOPE  339-858-3888) ? [place] Assists [place] cancer patients and their families through emotional , educational and financial support.  203-629-3344 ? [place] DSS Where to apply for food stamps, Medicaid and utility assistance. (629)770-2527 ? RCATS: Transportation to medical appointments. 801-817-0924 ? Social Security Administration: May apply for disability if have a Stage IV cancer. 210-512-7714 252-769-8486 ? [employer], Disability and Transit Services: Assists with nutrition, care and transit needs. [phone]

## 2017-08-23 NOTE — Progress Notes (Signed)
CC:  My finger is doing OK  She has no pain of the left long finger.  The wounds have healed.    NV intact.  X-rays were done today, reported separately.  Diagnosis:  Healing nondisplaced open fracture of the left long finger  Return in one month.  X-rays on return.  Call if any problem.  Precautions discussed.   Electronically Signed Sanjuana Kava, MD 10/3/20182:51 PM

## 2017-08-23 NOTE — Patient Instructions (Signed)
Steps to Quit Smoking Smoking tobacco can be bad for your health. It can also affect almost every organ in your body. Smoking puts you and people around you at risk for many serious Keno Caraway-lasting (chronic) diseases. Quitting smoking is hard, but it is one of the best things that you can do for your health. It is never too late to quit. What are the benefits of quitting smoking? When you quit smoking, you lower your risk for getting serious diseases and conditions. They can include:  Lung cancer or lung disease.  Heart disease.  Stroke.  Heart attack.  Not being able to have children (infertility).  Weak bones (osteoporosis) and broken bones (fractures).  If you have coughing, wheezing, and shortness of breath, those symptoms may get better when you quit. You may also get sick less often. If you are pregnant, quitting smoking can help to lower your chances of having a baby of low birth weight. What can I do to help me quit smoking? Talk with your doctor about what can help you quit smoking. Some things you can do (strategies) include:  Quitting smoking totally, instead of slowly cutting back how much you smoke over a period of time.  Going to in-person counseling. You are more likely to quit if you go to many counseling sessions.  Using resources and support systems, such as: ? Online chats with a counselor. ? Phone quitlines. ? Printed self-help materials. ? Support groups or group counseling. ? Text messaging programs. ? Mobile phone apps or applications.  Taking medicines. Some of these medicines may have nicotine in them. If you are pregnant or breastfeeding, do not take any medicines to quit smoking unless your doctor says it is okay. Talk with your doctor about counseling or other things that can help you.  Talk with your doctor about using more than one strategy at the same time, such as taking medicines while you are also going to in-person counseling. This can help make  quitting easier. What things can I do to make it easier to quit? Quitting smoking might feel very hard at first, but there is a lot that you can do to make it easier. Take these steps:  Talk to your family and friends. Ask them to support and encourage you.  Call phone quitlines, reach out to support groups, or work with a counselor.  Ask people who smoke to not smoke around you.  Avoid places that make you want (trigger) to smoke, such as: ? Bars. ? Parties. ? Smoke-break areas at work.  Spend time with people who do not smoke.  Lower the stress in your life. Stress can make you want to smoke. Try these things to help your stress: ? Getting regular exercise. ? Deep-breathing exercises. ? Yoga. ? Meditating. ? Doing a body scan. To do this, close your eyes, focus on one area of your body at a time from head to toe, and notice which parts of your body are tense. Try to relax the muscles in those areas.  Download or buy apps on your mobile phone or tablet that can help you stick to your quit plan. There are many free apps, such as QuitGuide from the CDC (Centers for Disease Control and Prevention). You can find more support from smokefree.gov and other websites.  This information is not intended to replace advice given to you by your health care provider. Make sure you discuss any questions you have with your health care provider. Document Released: 09/03/2009 Document   Revised: 07/05/2016 Document Reviewed: 03/24/2015 Elsevier Interactive Patient Education  2018 Elsevier Inc.  

## 2017-08-23 NOTE — Progress Notes (Signed)
Leonard Downing tolerated Opdivo infusion well without complaints or incident. Labs reviewed with Dr. Talbert Cage prior to administering this medication. VSS upon discharge. Pt discharged self ambulatory in satisfactory condition accompanied by family

## 2017-09-06 ENCOUNTER — Encounter (HOSPITAL_COMMUNITY): Payer: 59

## 2017-09-06 ENCOUNTER — Encounter (HOSPITAL_COMMUNITY): Payer: Self-pay

## 2017-09-06 ENCOUNTER — Encounter (HOSPITAL_BASED_OUTPATIENT_CLINIC_OR_DEPARTMENT_OTHER): Payer: 59

## 2017-09-06 ENCOUNTER — Encounter (HOSPITAL_BASED_OUTPATIENT_CLINIC_OR_DEPARTMENT_OTHER): Payer: 59 | Admitting: Oncology

## 2017-09-06 VITALS — BP 98/64 | HR 80 | Temp 98.2°F | Resp 18 | Wt 99.4 lb

## 2017-09-06 DIAGNOSIS — C3411 Malignant neoplasm of upper lobe, right bronchus or lung: Secondary | ICD-10-CM | POA: Diagnosis not present

## 2017-09-06 DIAGNOSIS — C7951 Secondary malignant neoplasm of bone: Secondary | ICD-10-CM

## 2017-09-06 DIAGNOSIS — C7972 Secondary malignant neoplasm of left adrenal gland: Secondary | ICD-10-CM

## 2017-09-06 DIAGNOSIS — Z5112 Encounter for antineoplastic immunotherapy: Secondary | ICD-10-CM | POA: Diagnosis not present

## 2017-09-06 DIAGNOSIS — C7971 Secondary malignant neoplasm of right adrenal gland: Secondary | ICD-10-CM | POA: Diagnosis not present

## 2017-09-06 DIAGNOSIS — E278 Other specified disorders of adrenal gland: Secondary | ICD-10-CM

## 2017-09-06 DIAGNOSIS — E032 Hypothyroidism due to medicaments and other exogenous substances: Secondary | ICD-10-CM

## 2017-09-06 LAB — COMPREHENSIVE METABOLIC PANEL
ALBUMIN: 4.1 g/dL (ref 3.5–5.0)
ALK PHOS: 78 U/L (ref 38–126)
ALT: 9 U/L — AB (ref 14–54)
ANION GAP: 8 (ref 5–15)
AST: 17 U/L (ref 15–41)
BUN: 18 mg/dL (ref 6–20)
CALCIUM: 10.3 mg/dL (ref 8.9–10.3)
CO2: 25 mmol/L (ref 22–32)
Chloride: 105 mmol/L (ref 101–111)
Creatinine, Ser: 1.3 mg/dL — ABNORMAL HIGH (ref 0.44–1.00)
GFR calc Af Amer: 50 mL/min — ABNORMAL LOW (ref >60)
GFR calc non Af Amer: 43 mL/min — ABNORMAL LOW (ref >60)
GLUCOSE: 106 mg/dL — AB (ref 65–99)
Potassium: 4.6 mmol/L (ref 3.5–5.1)
SODIUM: 138 mmol/L (ref 135–145)
Total Bilirubin: 0.3 mg/dL (ref 0.3–1.2)
Total Protein: 8.3 g/dL — ABNORMAL HIGH (ref 6.5–8.1)

## 2017-09-06 LAB — CBC WITH DIFFERENTIAL/PLATELET
BASOS ABS: 0 10*3/uL (ref 0.0–0.1)
BASOS PCT: 0 %
Eosinophils Absolute: 0.2 10*3/uL (ref 0.0–0.7)
Eosinophils Relative: 2 %
HEMATOCRIT: 39.1 % (ref 36.0–46.0)
HEMOGLOBIN: 12.7 g/dL (ref 12.0–15.0)
LYMPHS PCT: 26 %
Lymphs Abs: 2.3 10*3/uL (ref 0.7–4.0)
MCH: 30 pg (ref 26.0–34.0)
MCHC: 32.5 g/dL (ref 30.0–36.0)
MCV: 92.2 fL (ref 78.0–100.0)
MONOS PCT: 9 %
Monocytes Absolute: 0.8 10*3/uL (ref 0.1–1.0)
NEUTROS ABS: 5.7 10*3/uL (ref 1.7–7.7)
NEUTROS PCT: 63 %
Platelets: 293 10*3/uL (ref 150–400)
RBC: 4.24 MIL/uL (ref 3.87–5.11)
RDW: 15.2 % (ref 11.5–15.5)
WBC: 9.1 10*3/uL (ref 4.0–10.5)

## 2017-09-06 LAB — TSH: TSH: 1.521 u[IU]/mL (ref 0.350–4.500)

## 2017-09-06 MED ORDER — HEPARIN SOD (PORK) LOCK FLUSH 100 UNIT/ML IV SOLN
500.0000 [IU] | Freq: Once | INTRAVENOUS | Status: AC | PRN
  Administered 2017-09-06: 500 [IU]

## 2017-09-06 MED ORDER — SODIUM CHLORIDE 0.9 % IV SOLN
240.0000 mg | Freq: Once | INTRAVENOUS | Status: AC
  Administered 2017-09-06: 240 mg via INTRAVENOUS
  Filled 2017-09-06: qty 24

## 2017-09-06 MED ORDER — SODIUM CHLORIDE 0.9 % IV SOLN
Freq: Once | INTRAVENOUS | Status: AC
  Administered 2017-09-06: 11:00:00 via INTRAVENOUS

## 2017-09-06 MED ORDER — DENOSUMAB 120 MG/1.7ML ~~LOC~~ SOLN
120.0000 mg | Freq: Once | SUBCUTANEOUS | Status: AC
  Administered 2017-09-06: 120 mg via SUBCUTANEOUS
  Filled 2017-09-06: qty 1.7

## 2017-09-06 MED ORDER — SODIUM CHLORIDE 0.9 % IJ SOLN
10.0000 mL | INTRAMUSCULAR | Status: DC | PRN
  Administered 2017-09-06: 10 mL
  Filled 2017-09-06: qty 10

## 2017-09-06 NOTE — Patient Instructions (Signed)
[facility] Discharge Instructions for Patients Receiving Chemotherapy  Today you received the following chemotherapy agents opdivo and xgeva today.   To help prevent nausea and vomiting after your treatment, we encourage you to take your nausea medication.     If you develop nausea and vomiting that is not controlled by your nausea medication, call the clinic.   BELOW ARE SYMPTOMS THAT SHOULD BE REPORTED IMMEDIATELY:  *FEVER GREATER THAN 100.5 F  *CHILLS WITH OR WITHOUT FEVER  NAUSEA AND VOMITING THAT IS NOT CONTROLLED WITH YOUR NAUSEA MEDICATION  *UNUSUAL SHORTNESS OF BREATH  *UNUSUAL BRUISING OR BLEEDING  TENDERNESS IN MOUTH AND THROAT WITH OR WITHOUT PRESENCE OF ULCERS  *URINARY PROBLEMS  *BOWEL PROBLEMS  UNUSUAL RASH Items with * indicate a potential emergency and should be followed up as soon as possible.  Feel free to call the clinic should you have any questions or concerns. The clinic phone number is [phone].  Please show the [facility] at check-in to the Emergency Department and triage nurse.

## 2017-09-06 NOTE — Progress Notes (Signed)
[name], MD [place] [address]  CURRENT THERAPY:Nivolumab every 2 weeks.  Xgeva every 4 weeks.  INTERVAL HISTORY: Melissa Sullivan 61 y.o. female returns for followup of Stage IV adenocarcinoma, pancoast tumor, of right lung.  (EGFR and ALK negative) complicated by right brachial plexus neuralgia with disease on presentation including bilateral adrenal metastases, retroperitoneal lymph nodes, and osseous involvement.    Cancer of upper lobe of right lung ([place])   07/28/2014 Imaging    CT chest: Large R apical mass consistent with malignancy. This is destroying the R 2nd rib with extension into adjacent soft tissue. R hilar adenopathy with R 5cm adrenal metastatic lesion.      08/01/2014 Initial Biopsy    Lung, needle/core biopsy(ies), right upper lobe - POORLY DIFFERENTIATED ADENOCARCINOMA, SEE COMMENT.      08/08/2014 PET scan    Large hypermetabolic R apical mass with evidence of direct chest wall and mediastinal invasion, right retrocrural lymphadenopathy, extensive retroperitoneal lymphadenopathy, and metastatic lesions to the adrenal glands       09/02/2014 - 11/04/2014 Chemotherapy    Cisplatin/Pemetrexed/Avastin every 21 days x 4 cycles      10/07/2014 - 10/27/2014 Radiation Therapy    Right lung apex for control of brachioplexopathy.      12/24/2014 - 02/25/2015 Chemotherapy    Alimta/Avastin every 21 days.      02/20/2015 Imaging    Increase in size of right adrenal metastasis and subjacent confluent retrocaval lymphadenopathy      02/25/2015 -  Chemotherapy    Nivolumab, zometa      05/04/2015 Imaging    CT CAP- Stable to slight decrease in the posterior right apical lesion. Stable appearance of posterior right upper rib an upper thoracic bony lesions. Slight improvement in right upper lobe tree-in-bud opacity. No new or progressive findings in...      07/28/2015 Imaging    CT CAP- Reduced size of the right apical pleural parenchymal  lesion and reduced size of the right adrenal metastatic lesion. Resolution of prior retrocrural adenopathy.  Right eccentric T1 and T2 sclerosis with sclerosis and tapering of the right second..      11/17/2015 Imaging    CT CAP- Stable soft tissue thickening in the apex of the right hemi thorax. Stable right adrenal metastasis. Nodularity along the trachea and mainstem bronchi, relatively new from 07/28/2015, favoring adherent debris.      11/18/2015 Treatment Plan Change    Zometa HELD for upcoming tooth extraction      11/24/2015 Treatment Plan Change    Zometa on hold at this time in preparation for tooth extraction in March 2017.  Zometa las given on 11/18/2015.      02/03/2016 Imaging    CT CAP- Heterogeneous right apical masslike consolidation and right adrenal metastasis are unchanged      04/06/2016 Treatment Plan Change    Zometa restarted 6 weeks out from tooth extraction (04/06/2016)      05/12/2016 Imaging    CT CAP- NED in the chest, abdomen or pelvis.  Some areas of nodularity associated with the mainstem bronchi in the left upper lobe bronchus, favored to represent adherent inspissated secretions      08/17/2016 Imaging    CT CAP- 1. Stable CTs of the chest and abdomen. No evidence of progressive metastatic disease. 2. Probable treated tumor at the right apex, right adrenal gland and T2 vertebral body, stable. 3. Fluctuating nodularity along the walls  of the trachea and mainstem bronchi, likely secretions.      12/12/2016 Imaging    Further decrease in size of treated tumor within the right apex. 2. Stable treated tumor involving the right second rib and T2 vertebra. 3. Stable right adrenal gland treated tumor. 4. Emphysema 5. Aortic atherosclerosis      01/04/2017 Imaging    MRI brain- Normal brain MRI.  No intracranial metastatic disease.      03/15/2017 Imaging    CT CAP- 1. No new or progressive metastatic disease in the chest or abdomen. 2. Stable treated  tumor in the apical right upper lobe. Stable treated right posterior second rib and right T2 vertebral lesions. Stable treated right adrenal metastasis. 3. Aortic atherosclerosis. 4. Moderate emphysema with mild diffuse bronchial wall thickening, suggesting COPD.      06/12/2017 Imaging    CT CAP 1. Similar appearance of treated primary within the right apex. 2. Similar areas of sclerosis within the right second rib, T2, and less so T1 vertebral bodies. These are most consistent with treated metastasis. 3. Similar right adrenal treated metastasis. 4. No evidence of new or progressive disease. 5. Similar right and progressive left areas of bronchial wall thickening and probable mucoid impaction. Correlate with interval infectious symptoms. 6.  Emphysema (ICD10-J43.9). 7. Coronary artery atherosclerosis. Aortic Atherosclerosis (ICD10-I70.0).       Patient presents today for continued follow-up and opdivo treatment. Overall she states she's been doing well. She's been eating well. Weight is stable. She denies any chest pain, shortness breath, abdominal pain, nausea, vomiting, diarrhea, focal weakness. She has been volunteering Monday through Friday from 8:30-12:30 and she states she has been doing well with that and really enjoys it. She does state she feels tired after she comes home from volunteering.   Review of Systems  Constitutional: Negative.  Negative for chills, fever and weight loss.  HENT: Negative.  Negative for hearing loss, sore throat and tinnitus.   Eyes: Negative.  Negative for blurred vision, photophobia and discharge.  Respiratory: Negative.  Negative for cough, hemoptysis, shortness of breath and wheezing.   Cardiovascular: Negative.  Negative for chest pain, palpitations, orthopnea, claudication and leg swelling.  Gastrointestinal: Negative.  Negative for abdominal pain, blood in stool, constipation, diarrhea, melena, nausea and vomiting.  Genitourinary:  Negative.  Negative for dysuria and hematuria.  Musculoskeletal: Negative.  Negative for back pain, joint pain and myalgias.  Skin: Negative.  Negative for itching and rash.  Neurological: Negative.  Negative for dizziness, weakness and headaches.  Endo/Heme/Allergies: Negative.  Negative for environmental allergies and polydipsia. Does not bruise/bleed easily.  Psychiatric/Behavioral: Negative for depression. The patient is not nervous/anxious and does not have insomnia.     Past Medical History:  Diagnosis Date  . Adrenal mass, right ([place]) 07/28/2014  . Anemia   . Bone metastases ([place]) 04/05/2016  . Cancer ([place])    lung  right  . Diabetes mellitus without complication ([place])   . GERD (gastroesophageal reflux disease)   . Hyperlipidemia   . Hypertension   . Hypothyroidism due to medication 01/30/2017  . Lung mass 07/28/2014  . Reflux     Past Surgical History:  Procedure Laterality Date  . AMPUTATION Right 02/22/2017   Procedure: PARTIAL AMPUTATION RIGHT GREAT TOE;  Surgeon: Caprice Beaver, DPM;  Location: [facility] OR[facility];  Service: Podiatry;  Laterality: Right;  . APPENDECTOMY    . ESOPHAGOGASTRODUODENOSCOPY N/A 12/09/2014   ZOX:WRUEAV esophageal stricture/mild-to-noderate erosive gastritis. negative H.pylori  . FLEXIBLE SIGMOIDOSCOPY  2011   Dr. [name]: hyperplastic polyp  . LUNG BIOPSY Right 07/2014   CT guided  . MALONEY DILATION N/A 12/09/2014   Procedure: [name] DILATION;  Surgeon: [name], MD;  Location: [facility] SU[facility];  Service: Endoscopy;  Laterality: N/A;  . PORTACATH PLACEMENT Left 09/01/2014  . SAVORY DILATION N/A 12/09/2014   Procedure: SAVORY DILATION;  Surgeon: [name], MD;  Location: [facility] SU[facility];  Service: Endoscopy;  Laterality: N/A;    Family History  Problem Relation Age of Onset  . Cancer Sister   . Colon cancer Neg Hx     Social History   Social History  . Marital status: Married    Spouse name: N/A  . Number of children: N/A  . Years of  education: N/A   Social History Main Topics  . Smoking status: Light Tobacco Smoker    Packs/day: 0.30    Years: 20.00    Types: Cigarettes  . Smokeless tobacco: Never Used  . Alcohol use No  . Drug use: No  . Sexual activity: Not on file   Other Topics Concern  . Not on file   Social History Narrative  . No narrative on file     PHYSICAL EXAMINATION  ECOG PERFORMANCE STATUS: 0 - Asymptomatic   Physical Exam  Constitutional: She is oriented to person, place, and time and well-developed, well-nourished, and in no distress. No distress.  HENT:  Head: Normocephalic and atraumatic.  Mouth/Throat: No oropharyngeal exudate.  Eyes: Pupils are equal, round, and reactive to light. Conjunctivae are normal. No scleral icterus.  Neck: Normal range of motion. Neck supple. No JVD present.  Cardiovascular: Normal rate, regular rhythm and normal heart sounds.  Exam reveals no gallop and no friction rub.   No murmur heard. Pulmonary/Chest: Effort normal and breath sounds normal. No respiratory distress. She has no wheezes. She has no rales.  Abdominal: Soft. Bowel sounds are normal. She exhibits no distension. There is no tenderness. There is no guarding.  Musculoskeletal: She exhibits no edema or tenderness.  Lymphadenopathy:    She has no cervical adenopathy.  Neurological: She is alert and oriented to person, place, and time. No cranial nerve deficit.  Skin: Skin is warm and dry. No rash noted. No erythema. No pallor.  Psychiatric: Affect and judgment normal.    LABORATORY DATA: CBC    Component Value Date/Time   WBC 9.1 09/06/2017 1005   RBC 4.24 09/06/2017 1005   HGB 12.7 09/06/2017 1005   HCT 39.1 09/06/2017 1005   PLT 293 09/06/2017 1005   MCV 92.2 09/06/2017 1005   MCH 30.0 09/06/2017 1005   MCHC 32.5 09/06/2017 1005   RDW 15.2 09/06/2017 1005   LYMPHSABS 2.3 09/06/2017 1005   MONOABS 0.8 09/06/2017 1005   EOSABS 0.2 09/06/2017 1005   BASOSABS 0.0 09/06/2017 1005       Chemistry      Component Value Date/Time   NA 138 09/06/2017 0933   K 4.6 09/06/2017 0933   CL 105 09/06/2017 0933   CO2 25 09/06/2017 0933   BUN 18 09/06/2017 0933   CREATININE 1.30 (H) 09/06/2017 0933      Component Value Date/Time   CALCIUM 10.3 09/06/2017 0933   CALCIUM 9.7 06/03/2015 1000   ALKPHOS 78 09/06/2017 0933   AST 17 09/06/2017 0933   ALT 9 (L) 09/06/2017 0933   BILITOT 0.3 09/06/2017 0933     Lab Results  Component Value Date   TSH 1.521 09/06/2017  PENDING LABS:   RADIOGRAPHIC STUDIES:  Dg Finger Middle Left  Result Date: 08/23/2017 Clinical:  History of open fracture left long finger X-rays were done of the left long finger, three views. There is a fracture of the tip of the left long finger distal phalanx nondisplaced.  Bone quality is good. Impression:  Nondisplaced fracture of the tip of the distal phalanx left long finger. Electronically Signed Sanjuana Kava, MD 10/3/20182:49 PM     PATHOLOGY:    ASSESSMENT AND PLAN:  Stage IV adenocarcinoma, pancoast tumor, of right lung.  PLAN:  Clinically stable. She has stable disease on her last scans in July 2018. Plan to repeat her scans in November. Labs reviewed, proceed with opdivo today. Tolerating opdivo well. Continue with Nivolumab every 2 weeks and Xgeva every 4 weeks.   RTC in 6 weeks for follow up with scans 1-2 days prior to her next visit.  Orders Placed This Encounter  Procedures  . CT Abdomen Pelvis W Contrast    Standing Status:   Future    Standing Expiration Date:   09/06/2018    Order Specific Question:   If indicated for the ordered procedure, I authorize the administration of contrast media per Radiology protocol    Answer:   Yes    Order Specific Question:   Preferred imaging location?    Answer:   [facility]    Order Specific Question:   Radiology Contrast Protocol - do NOT remove file path    Answer:   \\[email or web address].pdf  . CT  Chest W Contrast    Standing Status:   Future    Standing Expiration Date:   09/06/2018    Order Specific Question:   If indicated for the ordered procedure, I authorize the administration of contrast media per Radiology protocol    Answer:   Yes    Order Specific Question:   Preferred imaging location?    Answer:   [facility]    Order Specific Question:   Radiology Contrast Protocol - do NOT remove file path    Answer:   \\[email or web address].[email or web address]     All questions were answered. The patient knows to call the clinic with any problems, questions or concerns. We can certainly see the patient much sooner if necessary.   This note is electronically signed by: Twana First, MD 09/06/2017 11:50 AM

## 2017-09-06 NOTE — Progress Notes (Signed)
To treatment area for oncology follow up and treatment.  Patient denied recent dental visits.  Denied jaw or tooth pain.  Taking calcium daily as directed.  No complaints of pain or neuropathy voiced.  Denied SOB.    Patient tolerated chemotherapy with no complaints voiced.  Port site clean and dry with no bruising or swelling noted.  Band aid applied.  VSS with discharge and left ambulatory with family.  No s/s of distress.

## 2017-09-19 ENCOUNTER — Other Ambulatory Visit (HOSPITAL_COMMUNITY): Payer: Self-pay | Admitting: *Deleted

## 2017-09-19 DIAGNOSIS — C7951 Secondary malignant neoplasm of bone: Secondary | ICD-10-CM

## 2017-09-19 DIAGNOSIS — C3411 Malignant neoplasm of upper lobe, right bronchus or lung: Secondary | ICD-10-CM

## 2017-09-20 ENCOUNTER — Encounter (HOSPITAL_BASED_OUTPATIENT_CLINIC_OR_DEPARTMENT_OTHER): Payer: 59

## 2017-09-20 ENCOUNTER — Encounter (HOSPITAL_COMMUNITY): Payer: Self-pay

## 2017-09-20 ENCOUNTER — Encounter (HOSPITAL_COMMUNITY): Payer: 59

## 2017-09-20 VITALS — BP 91/64 | HR 84 | Temp 98.6°F | Resp 16 | Wt 99.2 lb

## 2017-09-20 DIAGNOSIS — E278 Other specified disorders of adrenal gland: Secondary | ICD-10-CM

## 2017-09-20 DIAGNOSIS — C7951 Secondary malignant neoplasm of bone: Secondary | ICD-10-CM

## 2017-09-20 DIAGNOSIS — C3411 Malignant neoplasm of upper lobe, right bronchus or lung: Secondary | ICD-10-CM | POA: Diagnosis not present

## 2017-09-20 DIAGNOSIS — C7972 Secondary malignant neoplasm of left adrenal gland: Secondary | ICD-10-CM | POA: Diagnosis not present

## 2017-09-20 DIAGNOSIS — E032 Hypothyroidism due to medicaments and other exogenous substances: Secondary | ICD-10-CM

## 2017-09-20 DIAGNOSIS — C7971 Secondary malignant neoplasm of right adrenal gland: Secondary | ICD-10-CM

## 2017-09-20 DIAGNOSIS — Z5112 Encounter for antineoplastic immunotherapy: Secondary | ICD-10-CM

## 2017-09-20 LAB — CBC WITH DIFFERENTIAL/PLATELET
BASOS ABS: 0 10*3/uL (ref 0.0–0.1)
Basophils Relative: 0 %
EOS PCT: 2 %
Eosinophils Absolute: 0.2 10*3/uL (ref 0.0–0.7)
HCT: 38.3 % (ref 36.0–46.0)
HEMOGLOBIN: 12.9 g/dL (ref 12.0–15.0)
LYMPHS PCT: 28 %
Lymphs Abs: 2.8 10*3/uL (ref 0.7–4.0)
MCH: 30.7 pg (ref 26.0–34.0)
MCHC: 33.7 g/dL (ref 30.0–36.0)
MCV: 91.2 fL (ref 78.0–100.0)
Monocytes Absolute: 0.7 10*3/uL (ref 0.1–1.0)
Monocytes Relative: 7 %
NEUTROS PCT: 63 %
Neutro Abs: 6.2 10*3/uL (ref 1.7–7.7)
PLATELETS: 305 10*3/uL (ref 150–400)
RBC: 4.2 MIL/uL (ref 3.87–5.11)
RDW: 14.9 % (ref 11.5–15.5)
WBC: 9.9 10*3/uL (ref 4.0–10.5)

## 2017-09-20 LAB — COMPREHENSIVE METABOLIC PANEL
ALK PHOS: 75 U/L (ref 38–126)
ALT: 10 U/L — AB (ref 14–54)
AST: 19 U/L (ref 15–41)
Albumin: 4.3 g/dL (ref 3.5–5.0)
Anion gap: 8 (ref 5–15)
BUN: 20 mg/dL (ref 6–20)
CHLORIDE: 101 mmol/L (ref 101–111)
CO2: 26 mmol/L (ref 22–32)
CREATININE: 1.5 mg/dL — AB (ref 0.44–1.00)
Calcium: 10.6 mg/dL — ABNORMAL HIGH (ref 8.9–10.3)
GFR calc Af Amer: 42 mL/min — ABNORMAL LOW (ref >60)
GFR calc non Af Amer: 36 mL/min — ABNORMAL LOW (ref >60)
Glucose, Bld: 116 mg/dL — ABNORMAL HIGH (ref 65–99)
Potassium: 5 mmol/L (ref 3.5–5.1)
SODIUM: 135 mmol/L (ref 135–145)
Total Bilirubin: 0.5 mg/dL (ref 0.3–1.2)
Total Protein: 8.3 g/dL — ABNORMAL HIGH (ref 6.5–8.1)

## 2017-09-20 LAB — TSH: TSH: 1.558 u[IU]/mL (ref 0.350–4.500)

## 2017-09-20 MED ORDER — SODIUM CHLORIDE 0.9 % IV SOLN
240.0000 mg | Freq: Once | INTRAVENOUS | Status: AC
  Administered 2017-09-20: 240 mg via INTRAVENOUS
  Filled 2017-09-20: qty 24

## 2017-09-20 MED ORDER — HEPARIN SOD (PORK) LOCK FLUSH 100 UNIT/ML IV SOLN
500.0000 [IU] | Freq: Once | INTRAVENOUS | Status: AC | PRN
  Administered 2017-09-20: 500 [IU]

## 2017-09-20 MED ORDER — SODIUM CHLORIDE 0.9 % IV SOLN
Freq: Once | INTRAVENOUS | Status: AC
  Administered 2017-09-20: 12:00:00 via INTRAVENOUS

## 2017-09-20 MED ORDER — SODIUM CHLORIDE 0.9 % IJ SOLN
10.0000 mL | INTRAMUSCULAR | Status: DC | PRN
  Administered 2017-09-20: 10 mL
  Filled 2017-09-20: qty 10

## 2017-09-20 NOTE — Progress Notes (Signed)
Labs reviewed with MD, proceed with treatment.  Treatment given per orders. Patient tolerated it well without problems. Vitals stable and discharged home from clinic ambulatory. Follow up as scheduled.

## 2017-09-20 NOTE — Patient Instructions (Signed)
[facility] Discharge Instructions for Patients Receiving Chemotherapy   Beginning January 23rd 2017 lab work for the Cancer Center will be done in the  Main lab at [facility] on 1st floor. If you have a lab appointment with the Cancer Center please come in thru the  Main Entrance and check in at the main information desk   Today you received the following chemotherapy agents   To help prevent nausea and vomiting after your treatment, we encourage you to take your nausea medication     If you develop nausea and vomiting, or diarrhea that is not controlled by your medication, call the clinic.  The clinic phone number is [phone]. Office hours are Monday-Friday 8:30am-5:00pm.  BELOW ARE SYMPTOMS THAT SHOULD BE REPORTED IMMEDIATELY:  *FEVER GREATER THAN 101.0 F  *CHILLS WITH OR WITHOUT FEVER  NAUSEA AND VOMITING THAT IS NOT CONTROLLED WITH YOUR NAUSEA MEDICATION  *UNUSUAL SHORTNESS OF BREATH  *UNUSUAL BRUISING OR BLEEDING  TENDERNESS IN MOUTH AND THROAT WITH OR WITHOUT PRESENCE OF ULCERS  *URINARY PROBLEMS  *BOWEL PROBLEMS  UNUSUAL RASH Items with * indicate a potential emergency and should be followed up as soon as possible. If you have an emergency after office hours please contact your primary care physician or go to the nearest emergency department.  Please call the clinic during office hours if you have any questions or concerns.   You may also contact the Patient Navigator at [phone] should you have any questions or need assistance in obtaining follow up care.      Resources For Cancer Patients and their Caregivers ? American Cancer Society: Can assist with transportation, wigs, general needs, runs Look Good Feel Better.        1-888-227-6333 ? Cancer Care: Provides financial assistance, online support groups, medication/co-pay assistance.  1-800-813-HOPE (4673) ? [facility] Assists [place] cancer  patients and their families through emotional , educational and financial support.  336-427-4357 ? [place] DSS Where to apply for food stamps, Medicaid and utility assistance. 336-342-1394 ? RCATS: Transportation to medical appointments. 336-347-2287 ? Social Security Administration: May apply for disability if have a Stage IV cancer. 336-342-7796 1-800-772-1213 ? [place] Aging, Disability and Transit Services: Assists with nutrition, care and transit needs. 336-349-2343         

## 2017-09-21 ENCOUNTER — Ambulatory Visit (INDEPENDENT_AMBULATORY_CARE_PROVIDER_SITE_OTHER): Payer: 59

## 2017-09-21 ENCOUNTER — Ambulatory Visit (INDEPENDENT_AMBULATORY_CARE_PROVIDER_SITE_OTHER): Payer: 59 | Admitting: Orthopaedic Surgery

## 2017-09-21 DIAGNOSIS — S62633D Displaced fracture of distal phalanx of left middle finger, subsequent encounter for fracture with routine healing: Secondary | ICD-10-CM

## 2017-09-21 NOTE — Patient Instructions (Signed)
Steps to Quit Smoking Smoking tobacco can be bad for your health. It can also affect almost every organ in your body. Smoking puts you and people around you at risk for many serious long-lasting (chronic) diseases. Quitting smoking is hard, but it is one of the best things that you can do for your health. It is never too late to quit. What are the benefits of quitting smoking? When you quit smoking, you lower your risk for getting serious diseases and conditions. They can include:  Lung cancer or lung disease.  Heart disease.  Stroke.  Heart attack.  Not being able to have children (infertility).  Weak bones (osteoporosis) and broken bones (fractures).  If you have coughing, wheezing, and shortness of breath, those symptoms may get better when you quit. You may also get sick less often. If you are pregnant, quitting smoking can help to lower your chances of having a baby of low birth weight. What can I do to help me quit smoking? Talk with your doctor about what can help you quit smoking. Some things you can do (strategies) include:  Quitting smoking totally, instead of slowly cutting back how much you smoke over a period of time.  Going to in-person counseling. You are more likely to quit if you go to many counseling sessions.  Using resources and support systems, such as: ? Online chats with a counselor. ? Phone quitlines. ? Printed self-help materials. ? Support groups or group counseling. ? Text messaging programs. ? Mobile phone apps or applications.  Taking medicines. Some of these medicines may have nicotine in them. If you are pregnant or breastfeeding, do not take any medicines to quit smoking unless your doctor says it is okay. Talk with your doctor about counseling or other things that can help you.  Talk with your doctor about using more than one strategy at the same time, such as taking medicines while you are also going to in-person counseling. This can help make  quitting easier. What things can I do to make it easier to quit? Quitting smoking might feel very hard at first, but there is a lot that you can do to make it easier. Take these steps:  Talk to your family and friends. Ask them to support and encourage you.  Call phone quitlines, reach out to support groups, or work with a counselor.  Ask people who smoke to not smoke around you.  Avoid places that make you want (trigger) to smoke, such as: ? Bars. ? Parties. ? Smoke-break areas at work.  Spend time with people who do not smoke.  Lower the stress in your life. Stress can make you want to smoke. Try these things to help your stress: ? Getting regular exercise. ? Deep-breathing exercises. ? Yoga. ? Meditating. ? Doing a body scan. To do this, close your eyes, focus on one area of your body at a time from head to toe, and notice which parts of your body are tense. Try to relax the muscles in those areas.  Download or buy apps on your mobile phone or tablet that can help you stick to your quit plan. There are many free apps, such as QuitGuide from the CDC (Centers for Disease Control and Prevention). You can find more support from smokefree.gov and other websites.  This information is not intended to replace advice given to you by your health care provider. Make sure you discuss any questions you have with your health care provider. Document Released: 09/03/2009 Document   Revised: 07/05/2016 Document Reviewed: 03/24/2015 Elsevier Interactive Patient Education  2018 Elsevier Inc.  

## 2017-09-21 NOTE — Progress Notes (Signed)
CC:  My finger does not hurt any more  She is doing well with the left long finger.  She has no pain, no swelling.  It is functioning well.  NV intact.  X-rays were done reported separately.  Encounter Diagnosis  Name Primary?  . Open displaced fracture of distal phalanx of left middle finger with routine healing, subsequent encounter Yes   Discharge.  Call if any problem.  Precautions discussed.   Electronically [place], MD 11/1/20182:09 PM

## 2017-09-26 ENCOUNTER — Encounter (HOSPITAL_COMMUNITY): Payer: 59 | Attending: Oncology

## 2017-09-26 ENCOUNTER — Other Ambulatory Visit (HOSPITAL_COMMUNITY): Payer: Self-pay | Admitting: Oncology

## 2017-09-26 DIAGNOSIS — C3411 Malignant neoplasm of upper lobe, right bronchus or lung: Secondary | ICD-10-CM

## 2017-09-26 DIAGNOSIS — C7951 Secondary malignant neoplasm of bone: Secondary | ICD-10-CM

## 2017-09-26 DIAGNOSIS — E032 Hypothyroidism due to medicaments and other exogenous substances: Secondary | ICD-10-CM

## 2017-09-26 DIAGNOSIS — T50905A Adverse effect of unspecified drugs, medicaments and biological substances, initial encounter: Secondary | ICD-10-CM | POA: Insufficient documentation

## 2017-09-26 DIAGNOSIS — Z23 Encounter for immunization: Secondary | ICD-10-CM

## 2017-09-26 MED ORDER — INFLUENZA VAC SPLIT QUAD 0.5 ML IM SUSY
0.5000 mL | PREFILLED_SYRINGE | Freq: Once | INTRAMUSCULAR | Status: AC
  Administered 2017-09-26: 0.5 mL via INTRAMUSCULAR

## 2017-09-26 MED ORDER — OXYCODONE HCL 10 MG PO TABS: 10.0000 mg | ORAL_TABLET | Freq: Four times a day (QID) | ORAL | 0 refills | Status: DC | PRN

## 2017-09-26 MED ORDER — INFLUENZA VAC SPLIT QUAD 0.5 ML IM SUSY
PREFILLED_SYRINGE | INTRAMUSCULAR | Status: AC
  Filled 2017-09-26: qty 0.5

## 2017-09-26 NOTE — Patient Instructions (Signed)
[place] at [facility] Discharge Instructions  RECOMMENDATIONS MADE BY THE CONSULTANT AND ANY TEST RESULTS WILL BE SENT TO YOUR REFERRING PHYSICIAN.  Flu shot given Follow up as scheduled.  Thank you for choosing [place] at [facility] to provide your oncology and hematology care.  To afford each patient quality time with our provider, please arrive at least 15 minutes before your scheduled appointment time.    If you have a lab appointment with the [facility] please come in thru the  Main Entrance and check in at the main information desk  You need to re-schedule your appointment should you arrive 10 or more minutes late.  We strive to give you quality time with our providers, and arriving late affects you and other patients whose appointments are after yours.  Also, if you no show three or more times for appointments you may be dismissed from the clinic at the providers discretion.     Again, thank you for choosing [facility]  Our hope is that these requests will decrease the amount of time that you wait before being seen by our physicians.       _____________________________________________________________  Should you have questions after your visit to [facility], please contact our office at [phone] between the hours of 8:30 a.m. and 4:30 p.m.  Voicemails left after 4:30 p.m. will not be returned until the following business day.  For prescription refill requests, have your pharmacy contact our office.       Resources For Cancer Patients and their Caregivers ? American Cancer Society: Can assist with transportation, wigs, general needs, runs Look Good Feel Better.        806-173-7381 ? Cancer Care: Provides financial assistance, online support groups, medication/co-pay assistance.  1-800-813-HOPE 7696209491) ? [facility] Assists [place] cancer patients and their  families through emotional , educational and financial support.  (385)675-3287 ? [place] DSS Where to apply for food stamps, Medicaid and utility assistance. 5612074383 ? RCATS: Transportation to medical appointments. 2483990781 ? Social Security Administration: May apply for disability if have a Stage IV cancer. (403) 374-5847 9142038418 ? [employer], Disability and Transit Services: Assists with nutrition, care and transit needs. [place] Support Programs: @10RELATIVEDAYS @ > Cancer Support Group  2nd Tuesday of the month 1pm-2pm, Journey Room  > Creative Journey  3rd Tuesday of the month 1130am-1pm, Journey Room  > Look Good Feel Better  1st Wednesday of the month 10am-12 noon, Journey Room (Call [place] to register [phone])

## 2017-09-26 NOTE — Progress Notes (Signed)
Melissa Sullivan presents today for injection per MD orders. Fluarix 0.32ml administered IM  in right Upper Arm. Administration without incident. Patient tolerated well.  Treatment given per orders. Patient tolerated it well without problems. Vitals stable and discharged home from clinic ambulatory. Follow up as scheduled.

## 2017-10-04 ENCOUNTER — Encounter (HOSPITAL_COMMUNITY): Payer: Self-pay

## 2017-10-04 ENCOUNTER — Encounter (HOSPITAL_COMMUNITY): Payer: 59

## 2017-10-04 ENCOUNTER — Encounter (HOSPITAL_BASED_OUTPATIENT_CLINIC_OR_DEPARTMENT_OTHER): Payer: 59

## 2017-10-04 VITALS — BP 102/61 | HR 79 | Temp 98.4°F | Resp 18 | Wt 99.2 lb

## 2017-10-04 DIAGNOSIS — Z5112 Encounter for antineoplastic immunotherapy: Secondary | ICD-10-CM

## 2017-10-04 DIAGNOSIS — C7971 Secondary malignant neoplasm of right adrenal gland: Secondary | ICD-10-CM | POA: Diagnosis not present

## 2017-10-04 DIAGNOSIS — C7972 Secondary malignant neoplasm of left adrenal gland: Secondary | ICD-10-CM | POA: Diagnosis not present

## 2017-10-04 DIAGNOSIS — C3411 Malignant neoplasm of upper lobe, right bronchus or lung: Secondary | ICD-10-CM

## 2017-10-04 DIAGNOSIS — C7951 Secondary malignant neoplasm of bone: Secondary | ICD-10-CM

## 2017-10-04 DIAGNOSIS — E032 Hypothyroidism due to medicaments and other exogenous substances: Secondary | ICD-10-CM | POA: Diagnosis not present

## 2017-10-04 DIAGNOSIS — T50905A Adverse effect of unspecified drugs, medicaments and biological substances, initial encounter: Secondary | ICD-10-CM | POA: Diagnosis not present

## 2017-10-04 DIAGNOSIS — E278 Other specified disorders of adrenal gland: Secondary | ICD-10-CM

## 2017-10-04 LAB — COMPREHENSIVE METABOLIC PANEL
ALK PHOS: 78 U/L (ref 38–126)
ALT: 10 U/L — ABNORMAL LOW (ref 14–54)
ANION GAP: 7 (ref 5–15)
AST: 18 U/L (ref 15–41)
Albumin: 4 g/dL (ref 3.5–5.0)
BILIRUBIN TOTAL: 0.2 mg/dL — AB (ref 0.3–1.2)
BUN: 15 mg/dL (ref 6–20)
CALCIUM: 10 mg/dL (ref 8.9–10.3)
CO2: 25 mmol/L (ref 22–32)
CREATININE: 1.32 mg/dL — AB (ref 0.44–1.00)
Chloride: 103 mmol/L (ref 101–111)
GFR calc Af Amer: 49 mL/min — ABNORMAL LOW (ref >60)
GFR calc non Af Amer: 43 mL/min — ABNORMAL LOW (ref >60)
GLUCOSE: 101 mg/dL — AB (ref 65–99)
Potassium: 4.2 mmol/L (ref 3.5–5.1)
Sodium: 135 mmol/L (ref 135–145)
TOTAL PROTEIN: 7.5 g/dL (ref 6.5–8.1)

## 2017-10-04 LAB — CBC WITH DIFFERENTIAL/PLATELET
Basophils Absolute: 0 10*3/uL (ref 0.0–0.1)
Basophils Relative: 0 %
Eosinophils Absolute: 0.3 10*3/uL (ref 0.0–0.7)
Eosinophils Relative: 4 %
HEMATOCRIT: 35.7 % — AB (ref 36.0–46.0)
HEMOGLOBIN: 11.8 g/dL — AB (ref 12.0–15.0)
LYMPHS ABS: 3.1 10*3/uL (ref 0.7–4.0)
Lymphocytes Relative: 36 %
MCH: 30.5 pg (ref 26.0–34.0)
MCHC: 33.1 g/dL (ref 30.0–36.0)
MCV: 92.2 fL (ref 78.0–100.0)
MONO ABS: 0.6 10*3/uL (ref 0.1–1.0)
MONOS PCT: 7 %
NEUTROS ABS: 4.6 10*3/uL (ref 1.7–7.7)
NEUTROS PCT: 53 %
Platelets: 255 10*3/uL (ref 150–400)
RBC: 3.87 MIL/uL (ref 3.87–5.11)
RDW: 14.7 % (ref 11.5–15.5)
WBC: 8.6 10*3/uL (ref 4.0–10.5)

## 2017-10-04 LAB — TSH: TSH: 2.44 u[IU]/mL (ref 0.350–4.500)

## 2017-10-04 MED ORDER — SODIUM CHLORIDE 0.9 % IV SOLN
Freq: Once | INTRAVENOUS | Status: AC
  Administered 2017-10-04: 11:00:00 via INTRAVENOUS

## 2017-10-04 MED ORDER — SODIUM CHLORIDE 0.9 % IV SOLN
240.0000 mg | Freq: Once | INTRAVENOUS | Status: AC
  Administered 2017-10-04: 240 mg via INTRAVENOUS
  Filled 2017-10-04: qty 24

## 2017-10-04 MED ORDER — SODIUM CHLORIDE 0.9 % IJ SOLN
10.0000 mL | INTRAMUSCULAR | Status: DC | PRN
  Administered 2017-10-04: 10 mL
  Filled 2017-10-04: qty 10

## 2017-10-04 MED ORDER — DENOSUMAB 120 MG/1.7ML ~~LOC~~ SOLN
120.0000 mg | Freq: Once | SUBCUTANEOUS | Status: AC
  Administered 2017-10-04: 120 mg via SUBCUTANEOUS
  Filled 2017-10-04: qty 1.7

## 2017-10-04 MED ORDER — HEPARIN SOD (PORK) LOCK FLUSH 100 UNIT/ML IV SOLN
500.0000 [IU] | Freq: Once | INTRAVENOUS | Status: AC | PRN
  Administered 2017-10-04: 500 [IU]

## 2017-10-04 NOTE — Patient Instructions (Addendum)
[facility] Discharge Instructions for Patients Receiving Chemotherapy   Beginning January 23rd 2017 lab work for the [facility] will be done in the  Main lab at [facility] on 1st floor. If you have a lab appointment with the [place] please come in thru the  Main Entrance and check in at the main information desk   Today you received the following chemotherapy agents Opdivo as well as Xgeva injection. Follow-up as scheduled. Call clinic for any questions or concerns  To help prevent nausea and vomiting after your treatment, we encourage you to take your nausea medication   If you develop nausea and vomiting, or diarrhea that is not controlled by your medication, call the clinic.  The clinic phone number is [phone]. Office hours are Monday-Friday 8:30am-5:00pm.  BELOW ARE SYMPTOMS THAT SHOULD BE REPORTED IMMEDIATELY:  *FEVER GREATER THAN 101.0 F  *CHILLS WITH OR WITHOUT FEVER  NAUSEA AND VOMITING THAT IS NOT CONTROLLED WITH YOUR NAUSEA MEDICATION  *UNUSUAL SHORTNESS OF BREATH  *UNUSUAL BRUISING OR BLEEDING  TENDERNESS IN MOUTH AND THROAT WITH OR WITHOUT PRESENCE OF ULCERS  *URINARY PROBLEMS  *BOWEL PROBLEMS  UNUSUAL RASH Items with * indicate a potential emergency and should be followed up as soon as possible. If you have an emergency after office hours please contact your primary care physician or go to the nearest emergency department.  Please call the clinic during office hours if you have any questions or concerns.   You may also contact the Patient Navigator at [phone] should you have any questions or need assistance in obtaining follow up care.      Resources For Cancer Patients and their Caregivers ? American Cancer Society: Can assist with transportation, wigs, general needs, runs Look Good Feel Better.        [phone] ? Cancer Care: Provides financial assistance, online support groups, medication/co-pay assistance.   1-800-813-HOPE 417 684 5179) ? [facility] Assists [place] cancer patients and their families through emotional , educational and financial support.  [phone] ? [place] DSS Where to apply for food stamps, Medicaid and utility assistance. 415-712-9481 ? [facility]: Transportation to medical appointments. ([phone] [phone] ? Social Security Administration: May apply for disability if have a Stage IV cancer. [phone] 660-449-2617 ? [employer], Disability and Transit Services: Assists with nutrition, care and transit needs. [phone]

## 2017-10-04 NOTE — Progress Notes (Signed)
Melissa Sullivan tolerated Opdivo infusion and Xgeva injection well without complaints or incident. Labs reviewed with Dr. Talbert Cage prior to administering these medications. Calcium 10 today and pt denies any tooth,jaw or leg pain and any recent or future dental visits. VSS upon discharge. Pt discharged self ambulatory in satisfactory condition

## 2017-10-16 ENCOUNTER — Ambulatory Visit (HOSPITAL_COMMUNITY)
Admission: RE | Admit: 2017-10-16 | Discharge: 2017-10-16 | Disposition: A | Discharge status: Home / Self Care | Payer: 59 | Source: Ambulatory Visit | Attending: Oncology | Admitting: Oncology

## 2017-10-16 DIAGNOSIS — C3411 Malignant neoplasm of upper lobe, right bronchus or lung: Secondary | ICD-10-CM | POA: Diagnosis present

## 2017-10-16 DIAGNOSIS — K5730 Diverticulosis of large intestine without perforation or abscess without bleeding: Secondary | ICD-10-CM | POA: Diagnosis not present

## 2017-10-16 DIAGNOSIS — C7951 Secondary malignant neoplasm of bone: Secondary | ICD-10-CM | POA: Diagnosis present

## 2017-10-16 DIAGNOSIS — I700 Atherosclerosis of aorta: Secondary | ICD-10-CM | POA: Insufficient documentation

## 2017-10-16 DIAGNOSIS — J439 Emphysema, unspecified: Secondary | ICD-10-CM | POA: Diagnosis not present

## 2017-10-16 MED ORDER — IOPAMIDOL (ISOVUE-300) INJECTION 61%
100.0000 mL | Freq: Once | INTRAVENOUS | Status: AC | PRN
  Administered 2017-10-16: 100 mL via INTRAVENOUS

## 2017-10-18 ENCOUNTER — Encounter (HOSPITAL_COMMUNITY): Payer: 59

## 2017-10-18 ENCOUNTER — Encounter (HOSPITAL_COMMUNITY): Payer: Self-pay | Admitting: Oncology

## 2017-10-18 ENCOUNTER — Encounter (HOSPITAL_BASED_OUTPATIENT_CLINIC_OR_DEPARTMENT_OTHER): Payer: 59 | Admitting: Oncology

## 2017-10-18 ENCOUNTER — Other Ambulatory Visit (HOSPITAL_COMMUNITY): Payer: Self-pay | Admitting: Oncology

## 2017-10-18 ENCOUNTER — Encounter (HOSPITAL_BASED_OUTPATIENT_CLINIC_OR_DEPARTMENT_OTHER): Payer: 59

## 2017-10-18 ENCOUNTER — Other Ambulatory Visit: Payer: Self-pay

## 2017-10-18 VITALS — BP 108/83 | HR 103 | Temp 98.8°F | Resp 16 | Ht 59.0 in | Wt 98.7 lb

## 2017-10-18 VITALS — BP 98/56 | HR 81 | Temp 98.2°F | Resp 16

## 2017-10-18 DIAGNOSIS — C3411 Malignant neoplasm of upper lobe, right bronchus or lung: Secondary | ICD-10-CM | POA: Diagnosis not present

## 2017-10-18 DIAGNOSIS — C7972 Secondary malignant neoplasm of left adrenal gland: Secondary | ICD-10-CM

## 2017-10-18 DIAGNOSIS — C7951 Secondary malignant neoplasm of bone: Secondary | ICD-10-CM

## 2017-10-18 DIAGNOSIS — Z5112 Encounter for antineoplastic immunotherapy: Secondary | ICD-10-CM | POA: Diagnosis not present

## 2017-10-18 DIAGNOSIS — C7971 Secondary malignant neoplasm of right adrenal gland: Secondary | ICD-10-CM

## 2017-10-18 DIAGNOSIS — E278 Other specified disorders of adrenal gland: Secondary | ICD-10-CM

## 2017-10-18 LAB — CBC WITH DIFFERENTIAL/PLATELET
BASOS ABS: 0 10*3/uL (ref 0.0–0.1)
BASOS PCT: 0 %
Eosinophils Absolute: 0.3 10*3/uL (ref 0.0–0.7)
Eosinophils Relative: 3 %
HEMATOCRIT: 40.2 % (ref 36.0–46.0)
HEMOGLOBIN: 12.6 g/dL (ref 12.0–15.0)
LYMPHS PCT: 27 %
Lymphs Abs: 2.5 10*3/uL (ref 0.7–4.0)
MCH: 29.4 pg (ref 26.0–34.0)
MCHC: 31.3 g/dL (ref 30.0–36.0)
MCV: 93.9 fL (ref 78.0–100.0)
MONO ABS: 0.6 10*3/uL (ref 0.1–1.0)
Monocytes Relative: 7 %
NEUTROS ABS: 6 10*3/uL (ref 1.7–7.7)
NEUTROS PCT: 63 %
Platelets: 289 10*3/uL (ref 150–400)
RBC: 4.28 MIL/uL (ref 3.87–5.11)
RDW: 15.2 % (ref 11.5–15.5)
WBC: 9.4 10*3/uL (ref 4.0–10.5)

## 2017-10-18 LAB — COMPREHENSIVE METABOLIC PANEL
ALBUMIN: 4.2 g/dL (ref 3.5–5.0)
ALT: 10 U/L — AB (ref 14–54)
AST: 16 U/L (ref 15–41)
Alkaline Phosphatase: 77 U/L (ref 38–126)
Anion gap: 6 (ref 5–15)
BILIRUBIN TOTAL: 0.3 mg/dL (ref 0.3–1.2)
BUN: 13 mg/dL (ref 6–20)
CO2: 28 mmol/L (ref 22–32)
CREATININE: 1.33 mg/dL — AB (ref 0.44–1.00)
Calcium: 10.2 mg/dL (ref 8.9–10.3)
Chloride: 101 mmol/L (ref 101–111)
GFR calc Af Amer: 49 mL/min — ABNORMAL LOW (ref >60)
GFR calc non Af Amer: 42 mL/min — ABNORMAL LOW (ref >60)
GLUCOSE: 93 mg/dL (ref 65–99)
POTASSIUM: 4 mmol/L (ref 3.5–5.1)
Sodium: 135 mmol/L (ref 135–145)
TOTAL PROTEIN: 8 g/dL (ref 6.5–8.1)

## 2017-10-18 LAB — TSH: TSH: 2.762 u[IU]/mL (ref 0.350–4.500)

## 2017-10-18 MED ORDER — SODIUM CHLORIDE 0.9 % IV SOLN
Freq: Once | INTRAVENOUS | Status: AC
  Administered 2017-10-18: 11:00:00 via INTRAVENOUS

## 2017-10-18 MED ORDER — SODIUM CHLORIDE 0.9 % IJ SOLN
10.0000 mL | INTRAMUSCULAR | Status: DC | PRN
  Administered 2017-10-18: 10 mL
  Filled 2017-10-18: qty 10

## 2017-10-18 MED ORDER — SODIUM CHLORIDE 0.9 % IV SOLN
240.0000 mg | Freq: Once | INTRAVENOUS | Status: AC
  Administered 2017-10-18: 240 mg via INTRAVENOUS
  Filled 2017-10-18: qty 24

## 2017-10-18 MED ORDER — HEPARIN SOD (PORK) LOCK FLUSH 100 UNIT/ML IV SOLN
500.0000 [IU] | Freq: Once | INTRAVENOUS | Status: AC | PRN
  Administered 2017-10-18: 500 [IU]

## 2017-10-18 NOTE — Progress Notes (Signed)
Labs reviewed with MD, proceed with treatment.  Treatment given per orders. Patient tolerated it well without problems. Vitals stable and discharged home from clinic ambulatory. Follow up as scheduled.

## 2017-10-18 NOTE — Progress Notes (Signed)
[name], MD [address] [place] [address]  CURRENT THERAPY:Nivolumab every 2 weeks.  Xgeva every 4 weeks.  INTERVAL HISTORY: Melissa Sullivan 61 y.o. female returns for followup of Stage IV adenocarcinoma, pancoast tumor, of right lung.  (EGFR and ALK negative) complicated by right brachial plexus neuralgia with disease on presentation including bilateral adrenal metastases, retroperitoneal lymph nodes, and osseous involvement.    Cancer of upper lobe of right lung ([place])   07/28/2014 Imaging    CT chest: Large R apical mass consistent with malignancy. This is destroying the R 2nd rib with extension into adjacent soft tissue. R hilar adenopathy with R 5cm adrenal metastatic lesion.      08/01/2014 Initial Biopsy    Lung, needle/core biopsy(ies), right upper lobe - POORLY DIFFERENTIATED ADENOCARCINOMA, SEE COMMENT.      08/08/2014 PET scan    Large hypermetabolic R apical mass with evidence of direct chest wall and mediastinal invasion, right retrocrural lymphadenopathy, extensive retroperitoneal lymphadenopathy, and metastatic lesions to the adrenal glands       09/02/2014 - 11/04/2014 Chemotherapy    Cisplatin/Pemetrexed/Avastin every 21 days x 4 cycles      10/07/2014 - 10/27/2014 Radiation Therapy    Right lung apex for control of brachioplexopathy.      12/24/2014 - 02/25/2015 Chemotherapy    Alimta/Avastin every 21 days.      02/20/2015 Imaging    Increase in size of right adrenal metastasis and subjacent confluent retrocaval lymphadenopathy      02/25/2015 -  Chemotherapy    Nivolumab, zometa      05/04/2015 Imaging    CT CAP- Stable to slight decrease in the posterior right apical lesion. Stable appearance of posterior right upper rib an upper thoracic bony lesions. Slight improvement in right upper lobe tree-in-bud opacity. No new or progressive findings in...      07/28/2015 Imaging    CT CAP- Reduced size of the right apical pleural parenchymal  lesion and reduced size of the right adrenal metastatic lesion. Resolution of prior retrocrural adenopathy.  Right eccentric T1 and T2 sclerosis with sclerosis and tapering of the right second..      11/17/2015 Imaging    CT CAP- Stable soft tissue thickening in the apex of the right hemi thorax. Stable right adrenal metastasis. Nodularity along the trachea and mainstem bronchi, relatively new from 07/28/2015, favoring adherent debris.      11/18/2015 Treatment Plan Change    Zometa HELD for upcoming tooth extraction      11/24/2015 Treatment Plan Change    Zometa on hold at this time in preparation for tooth extraction in March 2017.  Zometa las given on 11/18/2015.      02/03/2016 Imaging    CT CAP- Heterogeneous right apical masslike consolidation and right adrenal metastasis are unchanged      04/06/2016 Treatment Plan Change    Zometa restarted 6 weeks out from tooth extraction (04/06/2016)      05/12/2016 Imaging    CT CAP- NED in the chest, abdomen or pelvis.  Some areas of nodularity associated with the mainstem bronchi in the left upper lobe bronchus, favored to represent adherent inspissated secretions      08/17/2016 Imaging    CT CAP- 1. Stable CTs of the chest and abdomen. No evidence of progressive metastatic disease. 2. Probable treated tumor at the right apex, right adrenal gland and T2 vertebral body, stable. 3. Fluctuating nodularity along the  walls of the trachea and mainstem bronchi, likely secretions.      12/12/2016 Imaging    Further decrease in size of treated tumor within the right apex. 2. Stable treated tumor involving the right second rib and T2 vertebra. 3. Stable right adrenal gland treated tumor. 4. Emphysema 5. Aortic atherosclerosis      01/04/2017 Imaging    MRI brain- Normal brain MRI.  No intracranial metastatic disease.      03/15/2017 Imaging    CT CAP- 1. No new or progressive metastatic disease in the chest or abdomen. 2. Stable treated  tumor in the apical right upper lobe. Stable treated right posterior second rib and right T2 vertebral lesions. Stable treated right adrenal metastasis. 3. Aortic atherosclerosis. 4. Moderate emphysema with mild diffuse bronchial wall thickening, suggesting COPD.      06/12/2017 Imaging    CT CAP 1. Similar appearance of treated primary within the right apex. 2. Similar areas of sclerosis within the right second rib, T2, and less so T1 vertebral bodies. These are most consistent with treated metastasis. 3. Similar right adrenal treated metastasis. 4. No evidence of new or progressive disease. 5. Similar right and progressive left areas of bronchial wall thickening and probable mucoid impaction. Correlate with interval infectious symptoms. 6.  Emphysema (ICD10-J43.9). 7. Coronary artery atherosclerosis. Aortic Atherosclerosis (ICD10-I70.0).       10/16/2017 Imaging    CT CAP 1. Stable appearance of the prior Pancoast tumor and related bony findings in the right second rib and right T1 and T2 vertebra compatible with successfully treated tumor. No significant enlargement or new lesions are identified. Similarly the treated right adrenal metastatic lesion is stable in appearance. 2. Upper normal size right hilar lymph node may warrant surveillance. Currently 9 mm in short axis. 3. Other imaging findings of potential clinical significance: Aortic Atherosclerosis (ICD10-I70.0) and Emphysema (ICD10-J43.9). Scattered proximal sigmoid colon diverticula. Mucus plugging medially in the left upper lobe and posteriorly in the right upper lobe.       Patient presents today for continued follow-up and opdivo treatment. Overall she states she's been doing well. She states she has been having some sinus congestion and cough with clear sputum. No associated fevers/chills. She's been eating well, but states she lost a little weight because she cant taste food as well as she normally does due  to her sinus issues. She denies any chest pain, shortness breath, abdominal pain, nausea, vomiting, diarrhea, focal weakness.  Review of Systems  Constitutional: Negative.  Negative for chills, fever and weight loss.  HENT: Positive for congestion. Negative for hearing loss, sore throat and tinnitus.   Eyes: Negative.  Negative for blurred vision, photophobia and discharge.  Respiratory: Negative.  Negative for cough, hemoptysis, shortness of breath and wheezing.   Cardiovascular: Negative.  Negative for chest pain, palpitations, orthopnea, claudication and leg swelling.  Gastrointestinal: Negative.  Negative for abdominal pain, blood in stool, constipation, diarrhea, melena, nausea and vomiting.  Genitourinary: Negative.  Negative for dysuria and hematuria.  Musculoskeletal: Negative.  Negative for back pain, joint pain and myalgias.  Skin: Negative.  Negative for itching and rash.  Neurological: Negative.  Negative for dizziness, weakness and headaches.  Endo/Heme/Allergies: Negative.  Negative for environmental allergies and polydipsia. Does not bruise/bleed easily.  Psychiatric/Behavioral: Negative for depression. The patient is not nervous/anxious and does not have insomnia.     Past Medical History:  Diagnosis Date  . Adrenal mass, right ([place]) 07/28/2014  . Anemia   . Bone  metastases ([place]) 04/05/2016  . Cancer ([place])    lung  right  . Diabetes mellitus without complication ([place])   . GERD (gastroesophageal reflux disease)   . Hyperlipidemia   . Hypertension   . Hypothyroidism due to medication 01/30/2017  . Lung mass 07/28/2014  . Reflux     Past Surgical History:  Procedure Laterality Date  . AMPUTATION Right 02/22/2017   Procedure: PARTIAL AMPUTATION RIGHT GREAT TOE;  Surgeon: Caprice Beaver, DPM;  Location: [facility];  Service: Podiatry;  Laterality: Right;  . APPENDECTOMY    . ESOPHAGOGASTRODUODENOSCOPY N/A 12/09/2014   KGM:WNUUVO esophageal stricture/mild-to-noderate erosive  gastritis. negative H.pylori  . FLEXIBLE SIGMOIDOSCOPY  2011   Dr. [name]: hyperplastic polyp  . LUNG BIOPSY Right 07/2014   CT guided  . MALONEY DILATION N/A 12/09/2014   Procedure: [name] DILATION;  Surgeon: [name], MD;  Location: [facility] SU[facility];  Service: Endoscopy;  Laterality: N/A;  . PORTACATH PLACEMENT Left 09/01/2014  . SAVORY DILATION N/A 12/09/2014   Procedure: SAVORY DILATION;  Surgeon: [name], MD;  Location: [facility] SUITE;  Service: Endoscopy;  Laterality: N/A;    Family History  Problem Relation Age of Onset  . Cancer Sister   . Colon cancer Neg Hx     Social History   Socioeconomic History  . Marital status: Married    Spouse name: None  . Number of children: None  . Years of education: None  . Highest education level: None  Social Needs  . Financial resource strain: None  . Food insecurity - worry: None  . Food insecurity - inability: None  . Transportation needs - medical: None  . Transportation needs - non-medical: None  Occupational History  . None  Tobacco Use  . Smoking status: Light Tobacco Smoker    Packs/day: 0.30    Years: 20.00    Pack years: 6.00    Types: Cigarettes  . Smokeless tobacco: Never Used  Substance and Sexual Activity  . Alcohol use: No  . Drug use: No  . Sexual activity: None  Other Topics Concern  . None  Social History Narrative  . None     PHYSICAL EXAMINATION  ECOG PERFORMANCE STATUS: 0 - Asymptomatic   Physical Exam  Constitutional: She is oriented to person, place, and time and well-developed, well-nourished, and in no distress. No distress.  HENT:  Head: Normocephalic and atraumatic.  Mouth/Throat: No oropharyngeal exudate.  Eyes: Conjunctivae are normal. Pupils are equal, round, and reactive to light. No scleral icterus.  Neck: Normal range of motion. Neck supple. No JVD present.  Cardiovascular: Normal rate, regular rhythm and normal heart sounds. Exam reveals no gallop and no friction rub.    No murmur heard. Pulmonary/Chest: Effort normal and breath sounds normal. No respiratory distress. She has no wheezes. She has no rales.  Abdominal: Soft. Bowel sounds are normal. She exhibits no distension. There is no tenderness. There is no guarding.  Musculoskeletal: She exhibits no edema or tenderness.  Lymphadenopathy:    She has no cervical adenopathy.  Neurological: She is alert and oriented to person, place, and time. No cranial nerve deficit.  Skin: Skin is warm and dry. No rash noted. No erythema. No pallor.  Psychiatric: Affect and judgment normal.    LABORATORY DATA: CBC    Component Value Date/Time   WBC 9.4 10/18/2017 0948   RBC 4.28 10/18/2017 0948   HGB 12.6 10/18/2017 0948   HCT 40.2 10/18/2017 0948   PLT 289 10/18/2017  0948   MCV 93.9 10/18/2017 0948   MCH 29.4 10/18/2017 0948   MCHC 31.3 10/18/2017 0948   RDW 15.2 10/18/2017 0948   LYMPHSABS 2.5 10/18/2017 0948   MONOABS 0.6 10/18/2017 0948   EOSABS 0.3 10/18/2017 0948   BASOSABS 0.0 10/18/2017 0948      Chemistry      Component Value Date/Time   NA 135 10/18/2017 0948   K 4.0 10/18/2017 0948   CL 101 10/18/2017 0948   CO2 28 10/18/2017 0948   BUN 13 10/18/2017 0948   CREATININE 1.33 (H) 10/18/2017 0948      Component Value Date/Time   CALCIUM 10.2 10/18/2017 0948   CALCIUM 9.7 06/03/2015 1000   ALKPHOS 77 10/18/2017 0948   AST 16 10/18/2017 0948   ALT 10 (L) 10/18/2017 0948   BILITOT 0.3 10/18/2017 0948     Lab Results  Component Value Date   TSH 2.762 10/18/2017     PENDING LABS:   RADIOGRAPHIC STUDIES:  Ct Chest W Contrast  Result Date: 10/16/2017 CLINICAL DATA:  Right upper lobe lung cancer with osseous metastatic disease. Treated right adrenal metastatic lesion. EXAM: CT CHEST, ABDOMEN, AND PELVIS WITH CONTRAST TECHNIQUE: Multidetector CT imaging of the chest, abdomen and pelvis was performed following the standard protocol during bolus administration of intravenous contrast.  CONTRAST:  140m ISOVUE-300 IOPAMIDOL (ISOVUE-300) INJECTION 61% COMPARISON:  06/12/2017 FINDINGS: CT CHEST FINDINGS Cardiovascular: Aortic and branch vessel atherosclerotic calcification. Left SVC. Port-A-Cath with tip in the superior vena cava. Mediastinum/Nodes: Right hilar lymph node 0.9 cm in short axis on image 25/2, formerly 0.8 cm. Lungs/Pleura: 3.7 by 2.8 cm region of pleural thickening and calcification at the right lung apex with some soft tissue component, taking into account differences in how this is measured, the appearance is not appreciably changed from 06/12/2017. Looking back further such as to 02/03/2016, there has been no appreciable growth in this lesion. Severe centrilobular emphysema. As on the prior exam there is some mucus plugging and mild bronchiectasis posteriorly in the right upper lobe as on image 55/7. Cylindrical bronchiectasis with mucous plugging medially in the left upper lobe for example on image 72/7. Musculoskeletal: Stable chronic sclerosis of the right second rib. Stable sclerosis in the right T1 and T2 vertebral levels. CT ABDOMEN PELVIS FINDINGS Hepatobiliary: Mildly contracted gallbladder. Focal fatty infiltration along the falciform ligament. No findings of metastatic disease to the liver. Pancreas: Unremarkable Spleen: Unremarkable Adrenals/Urinary Tract: Speckled in rim calcification associated with the previously treated right adrenal metastatic lesion, the remaining lesion measures 2.7 by 1.3 cm on image 54/2, stable by my measurements. No growth or new adrenal lesion. The bladder, kidneys, and ureters appear normal. Stomach/Bowel: Scattered proximal sigmoid colon diverticula. Vascular/Lymphatic: Aortoiliac atherosclerotic vascular disease. No pathologic adenopathy in the abdomen or pelvis. Reproductive: Unremarkable Other: No supplemental non-categorized findings. Musculoskeletal: Suture material noted along the right anterolateral abdominal wall musculature.  IMPRESSION: 1. Stable appearance of the prior Pancoast tumor and related bony findings in the right second rib and right T1 and T2 vertebra compatible with successfully treated tumor. No significant enlargement or new lesions are identified. Similarly the treated right adrenal metastatic lesion is stable in appearance. 2. Upper normal size right hilar lymph node may warrant surveillance. Currently 9 mm in short axis. 3. Other imaging findings of potential clinical significance: Aortic Atherosclerosis (ICD10-I70.0) and Emphysema (ICD10-J43.9). Scattered proximal sigmoid colon diverticula. Mucus plugging medially in the left upper lobe and posteriorly in the right upper lobe. Electronically Signed  By: Van Clines M.D.   On: 10/16/2017 12:46   Ct Abdomen Pelvis W Contrast  Result Date: 10/16/2017 CLINICAL DATA:  Right upper lobe lung cancer with osseous metastatic disease. Treated right adrenal metastatic lesion. EXAM: CT CHEST, ABDOMEN, AND PELVIS WITH CONTRAST TECHNIQUE: Multidetector CT imaging of the chest, abdomen and pelvis was performed following the standard protocol during bolus administration of intravenous contrast. CONTRAST:  17m ISOVUE-300 IOPAMIDOL (ISOVUE-300) INJECTION 61% COMPARISON:  06/12/2017 FINDINGS: CT CHEST FINDINGS Cardiovascular: Aortic and branch vessel atherosclerotic calcification. Left SVC. Port-A-Cath with tip in the superior vena cava. Mediastinum/Nodes: Right hilar lymph node 0.9 cm in short axis on image 25/2, formerly 0.8 cm. Lungs/Pleura: 3.7 by 2.8 cm region of pleural thickening and calcification at the right lung apex with some soft tissue component, taking into account differences in how this is measured, the appearance is not appreciably changed from 06/12/2017. Looking back further such as to 02/03/2016, there has been no appreciable growth in this lesion. Severe centrilobular emphysema. As on the prior exam there is some mucus plugging and mild bronchiectasis  posteriorly in the right upper lobe as on image 55/7. Cylindrical bronchiectasis with mucous plugging medially in the left upper lobe for example on image 72/7. Musculoskeletal: Stable chronic sclerosis of the right second rib. Stable sclerosis in the right T1 and T2 vertebral levels. CT ABDOMEN PELVIS FINDINGS Hepatobiliary: Mildly contracted gallbladder. Focal fatty infiltration along the falciform ligament. No findings of metastatic disease to the liver. Pancreas: Unremarkable Spleen: Unremarkable Adrenals/Urinary Tract: Speckled in rim calcification associated with the previously treated right adrenal metastatic lesion, the remaining lesion measures 2.7 by 1.3 cm on image 54/2, stable by my measurements. No growth or new adrenal lesion. The bladder, kidneys, and ureters appear normal. Stomach/Bowel: Scattered proximal sigmoid colon diverticula. Vascular/Lymphatic: Aortoiliac atherosclerotic vascular disease. No pathologic adenopathy in the abdomen or pelvis. Reproductive: Unremarkable Other: No supplemental non-categorized findings. Musculoskeletal: Suture material noted along the right anterolateral abdominal wall musculature. IMPRESSION: 1. Stable appearance of the prior Pancoast tumor and related bony findings in the right second rib and right T1 and T2 vertebra compatible with successfully treated tumor. No significant enlargement or new lesions are identified. Similarly the treated right adrenal metastatic lesion is stable in appearance. 2. Upper normal size right hilar lymph node may warrant surveillance. Currently 9 mm in short axis. 3. Other imaging findings of potential clinical significance: Aortic Atherosclerosis (ICD10-I70.0) and Emphysema (ICD10-J43.9). Scattered proximal sigmoid colon diverticula. Mucus plugging medially in the left upper lobe and posteriorly in the right upper lobe. Electronically Signed   By: WVan ClinesM.D.   On: 10/16/2017 12:46   Dg Finger Middle Left  Result Date:  09/21/2017 Clinical:  History of left finger fracture X-rays were done of the left long finger, three views. There is a healing nondisplaced fracture of the distal tuft area of the distal phalanx of the left long finger.  Bone quality is good. Impression: healing nondisplaced fracture of the distal tuft of the distal phalanx left long finger. Electronically Signed WSanjuana Kava MD 11/1/20182:04 PM     PATHOLOGY:    ASSESSMENT AND PLAN:  Stage IV adenocarcinoma, pancoast tumor, of right lung.  PLAN:  Clinically stable. She has stable disease on her last scans on November 26,18. Reviewed scans with the patient. Labs reviewed, proceed with opdivo today. Tolerating opdivo well. Continue with Nivolumab every 2 weeks and Xgeva every 4 weeks.   I have told her to call me if her sinus  congestion worsens, then I will send in some augmentin for her.  RTC in 6 weeks for follow up.    All questions were answered. The patient knows to call the clinic with any problems, questions or concerns. We can certainly see the patient much sooner if necessary.   This note is electronically signed by: Twana First, MD 10/18/2017 10:59 AM

## 2017-10-18 NOTE — Patient Instructions (Signed)
[facility] Discharge Instructions for Patients Receiving Chemotherapy   Beginning January 23rd 2017 lab work for the Cancer Center will be done in the  Main lab at [facility] on 1st floor. If you have a lab appointment with the Cancer Center please come in thru the  Main Entrance and check in at the main information desk   Today you received the following chemotherapy agents   To help prevent nausea and vomiting after your treatment, we encourage you to take your nausea medication     If you develop nausea and vomiting, or diarrhea that is not controlled by your medication, call the clinic.  The clinic phone number is [phone]. Office hours are Monday-Friday 8:30am-5:00pm.  BELOW ARE SYMPTOMS THAT SHOULD BE REPORTED IMMEDIATELY:  *FEVER GREATER THAN 101.0 F  *CHILLS WITH OR WITHOUT FEVER  NAUSEA AND VOMITING THAT IS NOT CONTROLLED WITH YOUR NAUSEA MEDICATION  *UNUSUAL SHORTNESS OF BREATH  *UNUSUAL BRUISING OR BLEEDING  TENDERNESS IN MOUTH AND THROAT WITH OR WITHOUT PRESENCE OF ULCERS  *URINARY PROBLEMS  *BOWEL PROBLEMS  UNUSUAL RASH Items with * indicate a potential emergency and should be followed up as soon as possible. If you have an emergency after office hours please contact your primary care physician or go to the nearest emergency department.  Please call the clinic during office hours if you have any questions or concerns.   You may also contact the Patient Navigator at [phone] should you have any questions or need assistance in obtaining follow up care.      Resources For Cancer Patients and their Caregivers ? American Cancer Society: Can assist with transportation, wigs, general needs, runs Look Good Feel Better.        1-888-227-6333 ? Cancer Care: Provides financial assistance, online support groups, medication/co-pay assistance.  1-800-813-HOPE (4673) ? [facility] Assists [place] cancer  patients and their families through emotional , educational and financial support.  336-427-4357 ? [place] DSS Where to apply for food stamps, Medicaid and utility assistance. 336-342-1394 ? [facility]: Transportation to medical appointments. 336-347-2287 ? Social Security Administration: May apply for disability if have a Stage IV cancer. [phone] 1-800-772-1213 ? [place] Aging, Disability and Transit Services: Assists with nutrition, care and transit needs. [phone]         

## 2017-10-23 ENCOUNTER — Other Ambulatory Visit (HOSPITAL_COMMUNITY): Payer: Self-pay | Admitting: Hematology & Oncology

## 2017-10-23 DIAGNOSIS — C7951 Secondary malignant neoplasm of bone: Secondary | ICD-10-CM

## 2017-10-23 DIAGNOSIS — C3411 Malignant neoplasm of upper lobe, right bronchus or lung: Secondary | ICD-10-CM

## 2017-11-01 ENCOUNTER — Encounter (HOSPITAL_BASED_OUTPATIENT_CLINIC_OR_DEPARTMENT_OTHER): Payer: 59

## 2017-11-01 ENCOUNTER — Encounter (HOSPITAL_COMMUNITY): Payer: 59 | Attending: Oncology

## 2017-11-01 ENCOUNTER — Encounter (HOSPITAL_COMMUNITY): Payer: Self-pay

## 2017-11-01 ENCOUNTER — Other Ambulatory Visit: Payer: Self-pay

## 2017-11-01 VITALS — BP 106/60 | HR 82 | Temp 98.0°F | Resp 18 | Wt 98.4 lb

## 2017-11-01 DIAGNOSIS — C3411 Malignant neoplasm of upper lobe, right bronchus or lung: Secondary | ICD-10-CM

## 2017-11-01 DIAGNOSIS — Z5112 Encounter for antineoplastic immunotherapy: Secondary | ICD-10-CM

## 2017-11-01 DIAGNOSIS — C7951 Secondary malignant neoplasm of bone: Secondary | ICD-10-CM | POA: Diagnosis not present

## 2017-11-01 DIAGNOSIS — C7971 Secondary malignant neoplasm of right adrenal gland: Secondary | ICD-10-CM | POA: Diagnosis not present

## 2017-11-01 DIAGNOSIS — C7972 Secondary malignant neoplasm of left adrenal gland: Secondary | ICD-10-CM

## 2017-11-01 DIAGNOSIS — E278 Other specified disorders of adrenal gland: Secondary | ICD-10-CM

## 2017-11-01 LAB — COMPREHENSIVE METABOLIC PANEL
ALT: 12 U/L — AB (ref 14–54)
AST: 21 U/L (ref 15–41)
Albumin: 4 g/dL (ref 3.5–5.0)
Alkaline Phosphatase: 67 U/L (ref 38–126)
Anion gap: 6 (ref 5–15)
BILIRUBIN TOTAL: 0.4 mg/dL (ref 0.3–1.2)
BUN: 13 mg/dL (ref 6–20)
CHLORIDE: 104 mmol/L (ref 101–111)
CO2: 25 mmol/L (ref 22–32)
CREATININE: 1.29 mg/dL — AB (ref 0.44–1.00)
Calcium: 10 mg/dL (ref 8.9–10.3)
GFR, EST AFRICAN AMERICAN: 51 mL/min — AB (ref >60)
GFR, EST NON AFRICAN AMERICAN: 44 mL/min — AB (ref >60)
Glucose, Bld: 116 mg/dL — ABNORMAL HIGH (ref 65–99)
POTASSIUM: 4.4 mmol/L (ref 3.5–5.1)
Sodium: 135 mmol/L (ref 135–145)
Total Protein: 7.8 g/dL (ref 6.5–8.1)

## 2017-11-01 LAB — TSH: TSH: 1.995 u[IU]/mL (ref 0.350–4.500)

## 2017-11-01 LAB — CBC WITH DIFFERENTIAL/PLATELET
BASOS PCT: 0 %
Basophils Absolute: 0 10*3/uL (ref 0.0–0.1)
Eosinophils Absolute: 0.2 10*3/uL (ref 0.0–0.7)
Eosinophils Relative: 2 %
HEMATOCRIT: 38.4 % (ref 36.0–46.0)
Hemoglobin: 12 g/dL (ref 12.0–15.0)
LYMPHS PCT: 29 %
Lymphs Abs: 2.3 10*3/uL (ref 0.7–4.0)
MCH: 29.2 pg (ref 26.0–34.0)
MCHC: 31.3 g/dL (ref 30.0–36.0)
MCV: 93.4 fL (ref 78.0–100.0)
MONO ABS: 0.7 10*3/uL (ref 0.1–1.0)
MONOS PCT: 9 %
NEUTROS ABS: 5 10*3/uL (ref 1.7–7.7)
Neutrophils Relative %: 60 %
Platelets: 281 10*3/uL (ref 150–400)
RBC: 4.11 MIL/uL (ref 3.87–5.11)
RDW: 15.2 % (ref 11.5–15.5)
WBC: 8.2 10*3/uL (ref 4.0–10.5)

## 2017-11-01 MED ORDER — HEPARIN SOD (PORK) LOCK FLUSH 100 UNIT/ML IV SOLN
500.0000 [IU] | Freq: Once | INTRAVENOUS | Status: AC | PRN
  Administered 2017-11-01: 500 [IU]

## 2017-11-01 MED ORDER — SODIUM CHLORIDE 0.9 % IV SOLN
Freq: Once | INTRAVENOUS | Status: AC
  Administered 2017-11-01: 11:00:00 via INTRAVENOUS

## 2017-11-01 MED ORDER — DENOSUMAB 120 MG/1.7ML ~~LOC~~ SOLN
120.0000 mg | Freq: Once | SUBCUTANEOUS | Status: AC
  Administered 2017-11-01: 120 mg via SUBCUTANEOUS
  Filled 2017-11-01: qty 1.7

## 2017-11-01 MED ORDER — SODIUM CHLORIDE 0.9 % IV SOLN
240.0000 mg | Freq: Once | INTRAVENOUS | Status: AC
  Administered 2017-11-01: 240 mg via INTRAVENOUS
  Filled 2017-11-01: qty 24

## 2017-11-01 NOTE — Progress Notes (Signed)
Tolerated infusion w/o adverse reaction.  Alert, in no distress.  VSS.  Discharged ambulatory.  

## 2017-11-15 ENCOUNTER — Encounter (HOSPITAL_BASED_OUTPATIENT_CLINIC_OR_DEPARTMENT_OTHER): Payer: 59

## 2017-11-15 ENCOUNTER — Encounter (HOSPITAL_COMMUNITY): Payer: Self-pay

## 2017-11-15 ENCOUNTER — Other Ambulatory Visit: Payer: Self-pay

## 2017-11-15 ENCOUNTER — Encounter: Payer: Self-pay | Admitting: Oncology

## 2017-11-15 ENCOUNTER — Encounter (HOSPITAL_COMMUNITY): Payer: 59

## 2017-11-15 VITALS — BP 104/66 | HR 78 | Temp 98.0°F | Resp 18 | Wt 97.6 lb

## 2017-11-15 DIAGNOSIS — C7951 Secondary malignant neoplasm of bone: Secondary | ICD-10-CM

## 2017-11-15 DIAGNOSIS — E278 Other specified disorders of adrenal gland: Secondary | ICD-10-CM

## 2017-11-15 DIAGNOSIS — C7972 Secondary malignant neoplasm of left adrenal gland: Secondary | ICD-10-CM | POA: Diagnosis not present

## 2017-11-15 DIAGNOSIS — C3411 Malignant neoplasm of upper lobe, right bronchus or lung: Secondary | ICD-10-CM

## 2017-11-15 DIAGNOSIS — Z5112 Encounter for antineoplastic immunotherapy: Secondary | ICD-10-CM

## 2017-11-15 DIAGNOSIS — C7971 Secondary malignant neoplasm of right adrenal gland: Secondary | ICD-10-CM | POA: Diagnosis not present

## 2017-11-15 LAB — CBC WITH DIFFERENTIAL/PLATELET
BASOS ABS: 0 10*3/uL (ref 0.0–0.1)
Basophils Relative: 0 %
EOS PCT: 2 %
Eosinophils Absolute: 0.2 10*3/uL (ref 0.0–0.7)
HCT: 40.7 % (ref 36.0–46.0)
Hemoglobin: 13 g/dL (ref 12.0–15.0)
LYMPHS ABS: 2.3 10*3/uL (ref 0.7–4.0)
LYMPHS PCT: 25 %
MCH: 29.7 pg (ref 26.0–34.0)
MCHC: 31.9 g/dL (ref 30.0–36.0)
MCV: 93.1 fL (ref 78.0–100.0)
MONO ABS: 0.6 10*3/uL (ref 0.1–1.0)
Monocytes Relative: 6 %
Neutro Abs: 6.1 10*3/uL (ref 1.7–7.7)
Neutrophils Relative %: 67 %
PLATELETS: 276 10*3/uL (ref 150–400)
RBC: 4.37 MIL/uL (ref 3.87–5.11)
RDW: 15.4 % (ref 11.5–15.5)
WBC: 9.1 10*3/uL (ref 4.0–10.5)

## 2017-11-15 LAB — COMPREHENSIVE METABOLIC PANEL
ALT: 13 U/L — ABNORMAL LOW (ref 14–54)
ANION GAP: 9 (ref 5–15)
AST: 22 U/L (ref 15–41)
Albumin: 4.1 g/dL (ref 3.5–5.0)
Alkaline Phosphatase: 71 U/L (ref 38–126)
BUN: 15 mg/dL (ref 6–20)
CHLORIDE: 103 mmol/L (ref 101–111)
CO2: 26 mmol/L (ref 22–32)
Calcium: 9.7 mg/dL (ref 8.9–10.3)
Creatinine, Ser: 1.33 mg/dL — ABNORMAL HIGH (ref 0.44–1.00)
GFR, EST AFRICAN AMERICAN: 49 mL/min — AB (ref >60)
GFR, EST NON AFRICAN AMERICAN: 42 mL/min — AB (ref >60)
Glucose, Bld: 103 mg/dL — ABNORMAL HIGH (ref 65–99)
POTASSIUM: 4.1 mmol/L (ref 3.5–5.1)
Sodium: 138 mmol/L (ref 135–145)
Total Bilirubin: 0.3 mg/dL (ref 0.3–1.2)
Total Protein: 8.3 g/dL — ABNORMAL HIGH (ref 6.5–8.1)

## 2017-11-15 LAB — TSH: TSH: 1.466 u[IU]/mL (ref 0.350–4.500)

## 2017-11-15 MED ORDER — SODIUM CHLORIDE 0.9 % IV SOLN
Freq: Once | INTRAVENOUS | Status: AC
  Administered 2017-11-15: 10:00:00 via INTRAVENOUS

## 2017-11-15 MED ORDER — HEPARIN SOD (PORK) LOCK FLUSH 100 UNIT/ML IV SOLN
500.0000 [IU] | Freq: Once | INTRAVENOUS | Status: AC | PRN
  Administered 2017-11-15: 500 [IU]

## 2017-11-15 MED ORDER — OCTREOTIDE ACETATE 30 MG IM KIT
PACK | INTRAMUSCULAR | Status: AC
  Filled 2017-11-15: qty 1

## 2017-11-15 MED ORDER — SODIUM CHLORIDE 0.9 % IV SOLN
240.0000 mg | Freq: Once | INTRAVENOUS | Status: AC
  Administered 2017-11-15: 240 mg via INTRAVENOUS
  Filled 2017-11-15: qty 24

## 2017-11-15 NOTE — Progress Notes (Signed)
Tolerated infusion w/o adverse reaction.  Alert, in no distress.  VSS.  Discharged ambulatory.  

## 2017-11-29 ENCOUNTER — Inpatient Hospital Stay (HOSPITAL_BASED_OUTPATIENT_CLINIC_OR_DEPARTMENT_OTHER): Payer: 59 | Admitting: Hematology and Oncology

## 2017-11-29 ENCOUNTER — Encounter (HOSPITAL_COMMUNITY): Payer: Self-pay | Admitting: Hematology and Oncology

## 2017-11-29 ENCOUNTER — Ambulatory Visit (HOSPITAL_COMMUNITY): Payer: 59 | Admitting: Hematology and Oncology

## 2017-11-29 ENCOUNTER — Encounter (HOSPITAL_COMMUNITY): Payer: Self-pay

## 2017-11-29 ENCOUNTER — Inpatient Hospital Stay (HOSPITAL_COMMUNITY): Payer: 59

## 2017-11-29 ENCOUNTER — Inpatient Hospital Stay (HOSPITAL_COMMUNITY): Payer: 59 | Attending: Oncology

## 2017-11-29 ENCOUNTER — Other Ambulatory Visit: Payer: Self-pay

## 2017-11-29 VITALS — BP 120/57 | HR 66 | Temp 98.1°F | Resp 16 | Wt 98.0 lb

## 2017-11-29 DIAGNOSIS — Z79899 Other long term (current) drug therapy: Secondary | ICD-10-CM | POA: Diagnosis not present

## 2017-11-29 DIAGNOSIS — C7970 Secondary malignant neoplasm of unspecified adrenal gland: Secondary | ICD-10-CM | POA: Diagnosis not present

## 2017-11-29 DIAGNOSIS — C7951 Secondary malignant neoplasm of bone: Secondary | ICD-10-CM | POA: Diagnosis not present

## 2017-11-29 DIAGNOSIS — C3411 Malignant neoplasm of upper lobe, right bronchus or lung: Secondary | ICD-10-CM

## 2017-11-29 DIAGNOSIS — Z5112 Encounter for antineoplastic immunotherapy: Secondary | ICD-10-CM | POA: Diagnosis present

## 2017-11-29 DIAGNOSIS — C7971 Secondary malignant neoplasm of right adrenal gland: Secondary | ICD-10-CM | POA: Diagnosis not present

## 2017-11-29 DIAGNOSIS — E278 Other specified disorders of adrenal gland: Secondary | ICD-10-CM

## 2017-11-29 DIAGNOSIS — E032 Hypothyroidism due to medicaments and other exogenous substances: Secondary | ICD-10-CM

## 2017-11-29 LAB — CBC WITH DIFFERENTIAL/PLATELET
BASOS ABS: 0 10*3/uL (ref 0.0–0.1)
Basophils Relative: 0 %
Eosinophils Absolute: 0.2 10*3/uL (ref 0.0–0.7)
Eosinophils Relative: 2 %
HCT: 40 % (ref 36.0–46.0)
Hemoglobin: 12.7 g/dL (ref 12.0–15.0)
LYMPHS ABS: 2.2 10*3/uL (ref 0.7–4.0)
LYMPHS PCT: 18 %
MCH: 29.4 pg (ref 26.0–34.0)
MCHC: 31.8 g/dL (ref 30.0–36.0)
MCV: 92.6 fL (ref 78.0–100.0)
MONO ABS: 1.1 10*3/uL — AB (ref 0.1–1.0)
Monocytes Relative: 9 %
Neutro Abs: 8.3 10*3/uL — ABNORMAL HIGH (ref 1.7–7.7)
Neutrophils Relative %: 71 %
Platelets: 304 10*3/uL (ref 150–400)
RBC: 4.32 MIL/uL (ref 3.87–5.11)
RDW: 15.4 % (ref 11.5–15.5)
WBC: 11.8 10*3/uL — ABNORMAL HIGH (ref 4.0–10.5)

## 2017-11-29 LAB — COMPREHENSIVE METABOLIC PANEL
ALT: 11 U/L — ABNORMAL LOW (ref 14–54)
ANION GAP: 10 (ref 5–15)
AST: 20 U/L (ref 15–41)
Albumin: 4.1 g/dL (ref 3.5–5.0)
Alkaline Phosphatase: 71 U/L (ref 38–126)
BUN: 16 mg/dL (ref 6–20)
CHLORIDE: 101 mmol/L (ref 101–111)
CO2: 26 mmol/L (ref 22–32)
Calcium: 10 mg/dL (ref 8.9–10.3)
Creatinine, Ser: 1.32 mg/dL — ABNORMAL HIGH (ref 0.44–1.00)
GFR, EST AFRICAN AMERICAN: 49 mL/min — AB (ref >60)
GFR, EST NON AFRICAN AMERICAN: 43 mL/min — AB (ref >60)
Glucose, Bld: 86 mg/dL (ref 65–99)
POTASSIUM: 4 mmol/L (ref 3.5–5.1)
Sodium: 137 mmol/L (ref 135–145)
Total Bilirubin: 0.2 mg/dL — ABNORMAL LOW (ref 0.3–1.2)
Total Protein: 8.4 g/dL — ABNORMAL HIGH (ref 6.5–8.1)

## 2017-11-29 LAB — TSH: TSH: 2.108 u[IU]/mL (ref 0.350–4.500)

## 2017-11-29 MED ORDER — SODIUM CHLORIDE 0.9 % IJ SOLN
10.0000 mL | INTRAMUSCULAR | Status: DC | PRN
  Administered 2017-11-29: 10 mL
  Filled 2017-11-29: qty 10

## 2017-11-29 MED ORDER — HEPARIN SOD (PORK) LOCK FLUSH 100 UNIT/ML IV SOLN
500.0000 [IU] | Freq: Once | INTRAVENOUS | Status: AC | PRN
  Administered 2017-11-29: 500 [IU]

## 2017-11-29 MED ORDER — LEVOTHYROXINE SODIUM 25 MCG PO TABS: ORAL_TABLET | ORAL | 1 refills | Status: DC

## 2017-11-29 MED ORDER — OXYCODONE HCL 10 MG PO TABS: 10.0000 mg | ORAL_TABLET | Freq: Four times a day (QID) | ORAL | 0 refills | Status: DC | PRN

## 2017-11-29 MED ORDER — HEPARIN SOD (PORK) LOCK FLUSH 100 UNIT/ML IV SOLN: 500.0000 [IU] | Freq: Once | INTRAVENOUS | Status: DC | PRN

## 2017-11-29 MED ORDER — SODIUM CHLORIDE 0.9 % IV SOLN
Freq: Once | INTRAVENOUS | Status: AC
  Administered 2017-11-29: 10:00:00 via INTRAVENOUS

## 2017-11-29 MED ORDER — SODIUM CHLORIDE 0.9 % IV SOLN: Freq: Once | INTRAVENOUS | Status: AC

## 2017-11-29 MED ORDER — SODIUM CHLORIDE 0.9% FLUSH
10.0000 mL | INTRAVENOUS | Status: DC | PRN
  Administered 2017-11-29: 10 mL
  Filled 2017-11-29: qty 10

## 2017-11-29 MED ORDER — SODIUM CHLORIDE 0.9 % IV SOLN
480.0000 mg | Freq: Once | INTRAVENOUS | Status: AC
  Administered 2017-11-29: 480 mg via INTRAVENOUS
  Filled 2017-11-29: qty 48

## 2017-11-29 MED ORDER — DENOSUMAB 120 MG/1.7ML ~~LOC~~ SOLN
120.0000 mg | Freq: Once | SUBCUTANEOUS | Status: AC
  Administered 2017-11-29: 120 mg via SUBCUTANEOUS
  Filled 2017-11-29: qty 1.7

## 2017-11-29 NOTE — Patient Instructions (Signed)
[facility] Discharge Instructions for Patients Receiving Chemotherapy   Beginning January 23rd 2017 lab work for the [facility] will be done in the  Main lab at [facility] on 1st floor. If you have a lab appointment with the [place] please come in thru the  Main Entrance and check in at the main information desk  xgeva given today with chemo  Today you received the following chemotherapy agents   To help prevent nausea and vomiting after your treatment, we encourage you to take your nausea medication     If you develop nausea and vomiting, or diarrhea that is not controlled by your medication, call the clinic.  The clinic phone number is [phone]. Office hours are Monday-Friday 8:30am-5:00pm.  BELOW ARE SYMPTOMS THAT SHOULD BE REPORTED IMMEDIATELY:  *FEVER GREATER THAN 101.0 F  *CHILLS WITH OR WITHOUT FEVER  NAUSEA AND VOMITING THAT IS NOT CONTROLLED WITH YOUR NAUSEA MEDICATION  *UNUSUAL SHORTNESS OF BREATH  *UNUSUAL BRUISING OR BLEEDING  TENDERNESS IN MOUTH AND THROAT WITH OR WITHOUT PRESENCE OF ULCERS  *URINARY PROBLEMS  *BOWEL PROBLEMS  UNUSUAL RASH Items with * indicate a potential emergency and should be followed up as soon as possible. If you have an emergency after office hours please contact your primary care physician or go to the nearest emergency department.  Please call the clinic during office hours if you have any questions or concerns.   You may also contact the Patient Navigator at [phone] should you have any questions or need assistance in obtaining follow up care.      Resources For Cancer Patients and their Caregivers ? American Cancer Society: Can assist with transportation, wigs, general needs, runs Look Good Feel Better.        [phone] ? Cancer Care: Provides financial assistance, online support groups, medication/co-pay assistance.  1-800-813-HOPE (437)055-2102) ? [facility] Assists [place] cancer patients and their families through emotional , educational and financial support.  207-285-5473 ? [place] DSS Where to apply for food stamps, Medicaid and utility assistance. [phone] ? [facility]: Transportation to medical appointments. [phone] ? Social Security Administration: May apply for disability if have a Stage IV cancer. [phone] ? [employer], Disability and Transit Services: Assists with nutrition, care and transit needs. [phone]

## 2017-11-29 NOTE — Progress Notes (Signed)
Patient states that she is taking her calcium and vit D. No major dental work needed at this time.will give xgeva per protocol.  Changing to monthly opdivo.  Melissa Sullivan presents today for injection per MD orders. Xgeva 120 mg administered SQ  in right Abdomen. Administration without incident. Patient tolerated well.  Treatment given per orders. Patient tolerated it well without problems. Vitals stable and discharged home from clinic ambulatory. Follow up as scheduled.

## 2017-12-10 NOTE — Progress Notes (Signed)
[name] Follow-up Visit:  Assessment: Cancer of upper lobe of right lung 62 y.o. 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  Currently undergoing palliative systemic therapy with no vola Mab which she is tolerating reasonably well.  Denosumab is being administered for skeletal metastatic disease.  Clinical evaluation lab work today permissive to proceed with treatment as previously scheduled.  Plan: -- Proceed with nivolumab, change frequency of administration to every 4 weeks, while increasing dose up to 480 mg per dose.  Continue denosumab monthly -- Return to clinic in 4 weeks for labs, clinic assessment, and the next cycle of Nivolumab (Opdivo)/Denosumab. --Next disease assessment with CT of the chest/abdomen/pelvis at the end of February 2019.  Voice recognition software was used and creation of this note. Despite my best effort at editing the text, some misspelling/errors may have occurred.  Orders Placed This Encounter  Procedures  . CBC with Differential    Standing Status:   Future    Standing Expiration Date:   11/29/2018  . Comprehensive metabolic panel    Standing Status:   Future    Standing Expiration Date:   11/29/2018  . TSH    Standing Status:   Future    Standing Expiration Date:   11/29/2018    Cancer Staging No matching staging information was found for the patient.  All questions were answered. . The patient knows to call the clinic with any problems, questions or concerns.  This note was electronically signed.    History of Presenting Illness Melissa Sullivan 63 y.o. presenting to the [facility] for diagnosis of stage IV non-small cell lung carcinoma with  adenocarcinoma morphology, currently undergoing palliative therapy with no vola Mab and denosumab.  Patient returns to the clinic for continued clinical monitoring.  No new complaints since the last visit to the clinic.  Patient denies any progressive  shortness of breath, hemoptysis, or cough.  No new areas of pain.  Patient denies headaches, vision changes, shortness of breath, nausea, abdominal pain, diarrhea, or constipation.  No skin rash..  Oncological/hematological History:   Cancer of upper lobe of right lung ([place])   07/28/2014 Imaging    CT chest: Large R apical mass consistent with malignancy. This is destroying the R 2nd rib with extension into adjacent soft tissue. R hilar adenopathy with R 5cm adrenal metastatic lesion.      08/01/2014 Initial Biopsy    Lung, needle/core biopsy(ies), right upper lobe - POORLY DIFFERENTIATED ADENOCARCINOMA, SEE COMMENT.      08/08/2014 PET scan    Large hypermetabolic R apical mass with evidence of direct chest wall and mediastinal invasion, right retrocrural lymphadenopathy, extensive retroperitoneal lymphadenopathy, and metastatic lesions to the adrenal glands       09/02/2014 - 11/04/2014 Chemotherapy    Cisplatin/Pemetrexed/Avastin every 21 days x 4 cycles      10/07/2014 - 10/27/2014 Radiation Therapy    Right lung apex for control of brachioplexopathy.      12/24/2014 - 02/25/2015 Chemotherapy    Alimta/Avastin every 21 days.      02/20/2015 Imaging    Increase in size of right adrenal metastasis and subjacent confluent retrocaval lymphadenopathy      02/25/2015 -  Chemotherapy    Nivolumab, zometa      05/04/2015 Imaging    CT CAP- Stable to slight decrease in the posterior right apical lesion. Stable appearance of posterior right upper rib an upper thoracic bony lesions. Slight improvement in  right upper lobe tree-in-bud opacity. No new or progressive findings in...      07/28/2015 Imaging    CT CAP- Reduced size of the right apical pleural parenchymal lesion and reduced size of the right adrenal metastatic lesion. Resolution of prior retrocrural adenopathy.  Right eccentric T1 and T2 sclerosis with sclerosis and tapering of the right second..      11/17/2015 Imaging    CT CAP-  Stable soft tissue thickening in the apex of the right hemi thorax. Stable right adrenal metastasis. Nodularity along the trachea and mainstem bronchi, relatively new from 07/28/2015, favoring adherent debris.      11/18/2015 Treatment Plan Change    Zometa HELD for upcoming tooth extraction      11/24/2015 Treatment Plan Change    Zometa on hold at this time in preparation for tooth extraction in March 2017.  Zometa las given on 11/18/2015.      02/03/2016 Imaging    CT CAP- Heterogeneous right apical masslike consolidation and right adrenal metastasis are unchanged      04/06/2016 Treatment Plan Change    Zometa restarted 6 weeks out from tooth extraction (04/06/2016)      05/12/2016 Imaging    CT CAP- NED in the chest, abdomen or pelvis.  Some areas of nodularity associated with the mainstem bronchi in the left upper lobe bronchus, favored to represent adherent inspissated secretions      08/17/2016 Imaging    CT CAP- 1. Stable CTs of the chest and abdomen. No evidence of progressive metastatic disease. 2. Probable treated tumor at the right apex, right adrenal gland and T2 vertebral body, stable. 3. Fluctuating nodularity along the walls of the trachea and mainstem bronchi, likely secretions.      12/12/2016 Imaging    Further decrease in size of treated tumor within the right apex. 2. Stable treated tumor involving the right second rib and T2 vertebra. 3. Stable right adrenal gland treated tumor. 4. Emphysema 5. Aortic atherosclerosis      01/04/2017 Imaging    MRI brain- Normal brain MRI.  No intracranial metastatic disease.      03/15/2017 Imaging    CT CAP- 1. No new or progressive metastatic disease in the chest or abdomen. 2. Stable treated tumor in the apical right upper lobe. Stable treated right posterior second rib and right T2 vertebral lesions. Stable treated right adrenal metastasis. 3. Aortic atherosclerosis. 4. Moderate emphysema with mild diffuse bronchial  wall thickening, suggesting COPD.      06/12/2017 Imaging    CT CAP 1. Similar appearance of treated primary within the right apex. 2. Similar areas of sclerosis within the right second rib, T2, and less so T1 vertebral bodies. These are most consistent with treated metastasis. 3. Similar right adrenal treated metastasis. 4. No evidence of new or progressive disease. 5. Similar right and progressive left areas of bronchial wall thickening and probable mucoid impaction. Correlate with interval infectious symptoms. 6.  Emphysema (ICD10-J43.9). 7. Coronary artery atherosclerosis. Aortic Atherosclerosis (ICD10-I70.0).       10/16/2017 Imaging    CT CAP 1. Stable appearance of the prior Pancoast tumor and related bony findings in the right second rib and right T1 and T2 vertebra compatible with successfully treated tumor. No significant enlargement or new lesions are identified. Similarly the treated right adrenal metastatic lesion is stable in appearance. 2. Upper normal size right hilar lymph node may warrant surveillance. Currently 9 mm in short axis. 3. Other imaging findings of potential clinical  significance: Aortic Atherosclerosis (ICD10-I70.0) and Emphysema (ICD10-J43.9). Scattered proximal sigmoid colon diverticula. Mucus plugging medially in the left upper lobe and posteriorly in the right upper lobe.        Medical History: Past Medical History:  Diagnosis Date  . Adrenal mass, right ([place]) 07/28/2014  . Anemia   . Bone metastases ([place]) 04/05/2016  . Cancer ([place])    lung  right  . Diabetes mellitus without complication ([place])   . GERD (gastroesophageal reflux disease)   . Hyperlipidemia   . Hypertension   . Hypothyroidism due to medication 01/30/2017  . Lung mass 07/28/2014  . Reflux     Surgical History: Past Surgical History:  Procedure Laterality Date  . AMPUTATION Right 02/22/2017   Procedure: PARTIAL AMPUTATION RIGHT GREAT TOE;  Surgeon: Caprice Beaver, DPM;  Location: [facility] ORS;  Service: Podiatry;  Laterality: Right;  . APPENDECTOMY    . ESOPHAGOGASTRODUODENOSCOPY N/A 12/09/2014   CVE:LFYBOF esophageal stricture/mild-to-noderate erosive gastritis. negative H.pylori  . FLEXIBLE SIGMOIDOSCOPY  2011   Dr. [name]: hyperplastic polyp  . LUNG BIOPSY Right 07/2014   CT guided  . MALONEY DILATION N/A 12/09/2014   Procedure: [name] DILATION;  Surgeon: [name], MD;  Location: [facility] ENDO SUITE;  Service: Endoscopy;  Laterality: N/A;  . PORTACATH PLACEMENT Left 09/01/2014  . SAVORY DILATION N/A 12/09/2014   Procedure: SAVORY DILATION;  Surgeon: [name], MD;  Location: [facility] ENDO SUITE;  Service: Endoscopy;  Laterality: N/A;    Family History: Family History  Problem Relation Age of Onset  . Cancer Sister   . Colon cancer Neg Hx     Social History: Social History   Socioeconomic History  . Marital status: Married    Spouse name: Not on file  . Number of children: Not on file  . Years of education: Not on file  . Highest education level: Not on file  Social Needs  . Financial resource strain: Not on file  . Food insecurity - worry: Not on file  . Food insecurity - inability: Not on file  . Transportation needs - medical: Not on file  . Transportation needs - non-medical: Not on file  Occupational History  . Not on file  Tobacco Use  . Smoking status: Light Tobacco Smoker    Packs/day: 0.30    Years: 20.00    Pack years: 6.00    Types: Cigarettes  . Smokeless tobacco: Never Used  Substance and Sexual Activity  . Alcohol use: No  . Drug use: No  . Sexual activity: Not on file  Other Topics Concern  . Not on file  Social History Narrative  . Not on file    Allergies: No Known Allergies  Medications:  Current Outpatient Medications  Medication Sig Dispense Refill  . Calcium Carb-Cholecalciferol (CALCIUM 1000 + D PO) Take 1 tablet by mouth daily.    . cephALEXin (KEFLEX) 500 MG capsule Take 1 capsule (500 mg  total) by mouth 3 (three) times daily. 30 capsule 0  . dronabinol (MARINOL) 2.5 MG capsule Take 1 capsule (2.5 mg total) by mouth 2 (two) times daily before lunch and supper. 60 capsule 1  . Fluticasone-Salmeterol (ADVAIR DISKUS) 500-50 MCG/DOSE AEPB Inhale 1 puff into the lungs 2 (two) times daily. (Patient taking differently: Inhale 1 puff into the lungs 2 (two) times daily as needed (for respiratory issues.). ) 60 each 6  . glucose blood test strip Use as instructed (Patient taking differently: 1 each by Other route every other  day. Use as instructed) 100 each 12  . ibuprofen (ADVIL,MOTRIN) 200 MG tablet Take 400 mg by mouth every 8 (eight) hours as needed (for pain.).    Marland Kitchen KLOR-CON M20 20 MEQ tablet TAKE 1 TABLET BY MOUTH TWICE DAILY 60 tablet 3  . levothyroxine (SYNTHROID, LEVOTHROID) 25 MCG tablet TAKE 1 TABLET BY MOUTH ONCE DAILY BEFORE BREAKFAST 30 tablet 1  . lidocaine-prilocaine (EMLA) cream APPLY A QUARTER SIZE AMOUNT TO PORT SITE 1 HOUR PRIOR TO CHEMO.  DO NOT RUB IN.  COVER WITH PLASTIC WRAP 30 g 5  . magic mouthwash SOLN Take 5 mLs by mouth 4 (four) times daily as needed for mouth pain. 240 mL 2  . metoCLOPramide (REGLAN) 10 MG tablet TAKE ONE TABLET BY MOUTH 4 TIMES DAILY 120 tablet 0  . Nivolumab (OPDIVO IV) Inject 240 mg into the vein every 14 (fourteen) days.     Marland Kitchen NUTRITIONAL SUPPLEMENT LIQD Please provide patient with a nutrtional supplement 30 Can 6  . omeprazole (PRILOSEC) 40 MG capsule Take 1 capsule (40 mg total) by mouth daily. 90 capsule 3  . ondansetron (ZOFRAN) 8 MG tablet Take 1 tablet (8 mg total) by mouth every 8 (eight) hours as needed for nausea or vomiting. 60 tablet 1  . ONE TOUCH LANCETS MISC Use as directed (Patient taking differently: 1 each by Other route every other day. Use as directed) 200 each 6  . Oxycodone HCl 10 MG TABS Take 1-2 tablets (10-20 mg total) by mouth every 6 (six) hours as needed (pain.). 100 tablet 0  . senna (SENOKOT) 8.6 MG tablet Take 2  tablets by mouth at bedtime as needed for constipation. Reported on 02/24/2016    . simvastatin (ZOCOR) 40 MG tablet TAKE ONE TABLET BY MOUTH ONCE DAILY IN THE MORNING 30 tablet 6  . triamcinolone (KENALOG) 0.025 % cream Apply 1 application topically 2 (two) times daily as needed (for dry/itching skin.). Reported on 02/24/2016     No current facility-administered medications for this visit.     Review of Systems: Review of Systems  Constitutional: Positive for appetite change and fatigue.  All other systems reviewed and are negative.    PHYSICAL EXAMINATION There were no vitals taken for this visit.  ECOG PERFORMANCE STATUS: 1 - Symptomatic but completely ambulatory  Physical Exam  Constitutional: She is oriented to person, place, and time and well-developed, well-nourished, and in no distress. No distress.  HENT:  Head: Normocephalic and atraumatic.  Mouth/Throat: Oropharynx is clear and moist. No oropharyngeal exudate.  Eyes: Conjunctivae and EOM are normal. Pupils are equal, round, and reactive to light. No scleral icterus.  Neck: No thyromegaly present.  Cardiovascular: Normal rate, regular rhythm and normal heart sounds.  No murmur heard. Pulmonary/Chest: Effort normal and breath sounds normal. No respiratory distress. She has no wheezes. She has no rales.  Abdominal: Soft. Bowel sounds are normal. She exhibits no distension. There is no tenderness. There is no rebound.  Musculoskeletal: She exhibits no edema.  Lymphadenopathy:    She has no cervical adenopathy.  Neurological: She is alert and oriented to person, place, and time. She has normal reflexes. No cranial nerve deficit.  Skin: Skin is warm and dry. No rash noted. She is not diaphoretic. No erythema.     LABORATORY DATA: I have personally reviewed the data as listed: Appointment on 11/29/2017  Component Date Value Ref Range Status  . WBC 11/29/2017 11.8* 4.0 - 10.5 K/uL Final  . RBC 11/29/2017 4.32  3.87 - 5.11  MIL/uL Final  . Hemoglobin 11/29/2017 12.7  12.0 - 15.0 g/dL Final  . HCT 11/29/2017 40.0  36.0 - 46.0 % Final  . MCV 11/29/2017 92.6  78.0 - 100.0 fL Final  . MCH 11/29/2017 29.4  26.0 - 34.0 pg Final  . MCHC 11/29/2017 31.8  30.0 - 36.0 g/dL Final  . RDW 11/29/2017 15.4  11.5 - 15.5 % Final  . Platelets 11/29/2017 304  150 - 400 K/uL Final  . Neutrophils Relative % 11/29/2017 71  % Final  . Neutro Abs 11/29/2017 8.3* 1.7 - 7.7 K/uL Final  . Lymphocytes Relative 11/29/2017 18  % Final  . Lymphs Abs 11/29/2017 2.2  0.7 - 4.0 K/uL Final  . Monocytes Relative 11/29/2017 9  % Final  . Monocytes Absolute 11/29/2017 1.1* 0.1 - 1.0 K/uL Final  . Eosinophils Relative 11/29/2017 2  % Final  . Eosinophils Absolute 11/29/2017 0.2  0.0 - 0.7 K/uL Final  . Basophils Relative 11/29/2017 0  % Final  . Basophils Absolute 11/29/2017 0.0  0.0 - 0.1 K/uL Final  . Sodium 11/29/2017 137  135 - 145 mmol/L Final  . Potassium 11/29/2017 4.0  3.5 - 5.1 mmol/L Final  . Chloride 11/29/2017 101  101 - 111 mmol/L Final  . CO2 11/29/2017 26  22 - 32 mmol/L Final  . Glucose, Bld 11/29/2017 86  65 - 99 mg/dL Final  . BUN 11/29/2017 16  6 - 20 mg/dL Final  . Creatinine, Ser 11/29/2017 1.32* 0.44 - 1.00 mg/dL Final  . Calcium 11/29/2017 10.0  8.9 - 10.3 mg/dL Final  . Total Protein 11/29/2017 8.4* 6.5 - 8.1 g/dL Final  . Albumin 11/29/2017 4.1  3.5 - 5.0 g/dL Final  . AST 11/29/2017 20  15 - 41 U/L Final  . ALT 11/29/2017 11* 14 - 54 U/L Final  . Alkaline Phosphatase 11/29/2017 71  38 - 126 U/L Final  . Total Bilirubin 11/29/2017 0.2* 0.3 - 1.2 mg/dL Final  . GFR calc non Af Amer 11/29/2017 43* >60 mL/min Final  . GFR calc Af Amer 11/29/2017 49* >60 mL/min Final   Comment: (NOTE) The eGFR has been calculated using the CKD EPI equation. This calculation has not been validated in all clinical situations. eGFR's persistently <60 mL/min signify possible Chronic Kidney Disease.   . Anion gap 11/29/2017 10  5 - 15  Final  . TSH 11/29/2017 2.108  0.350 - 4.500 uIU/mL Final   Performed by a 3rd Generation assay with a functional sensitivity of <=0.01 uIU/mL.       Ardath Sax, MD

## 2017-12-10 NOTE — Assessment & Plan Note (Signed)
63 y.o. 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  Currently undergoing palliative systemic therapy with no vola Mab which she is tolerating reasonably well.  Denosumab is being administered for skeletal metastatic disease.  Clinical evaluation lab work today permissive to proceed with treatment as previously scheduled.  Plan: -- Proceed with nivolumab, change frequency of administration to every 4 weeks, while increasing dose up to 480 mg per dose.  Continue denosumab monthly -- Return to clinic in 4 weeks for labs, clinic assessment, and the next cycle of Nivolumab (Opdivo)/Denosumab. --Next disease assessment with CT of the chest/abdomen/pelvis at the end of February 2019.

## 2017-12-27 ENCOUNTER — Other Ambulatory Visit: Payer: Self-pay

## 2017-12-27 ENCOUNTER — Encounter (HOSPITAL_COMMUNITY): Payer: Self-pay | Admitting: Internal Medicine

## 2017-12-27 ENCOUNTER — Inpatient Hospital Stay (HOSPITAL_BASED_OUTPATIENT_CLINIC_OR_DEPARTMENT_OTHER): Payer: 59 | Admitting: Internal Medicine

## 2017-12-27 ENCOUNTER — Inpatient Hospital Stay (HOSPITAL_COMMUNITY): Payer: 59 | Attending: Oncology

## 2017-12-27 ENCOUNTER — Inpatient Hospital Stay (HOSPITAL_COMMUNITY): Payer: 59

## 2017-12-27 VITALS — BP 107/76 | HR 92 | Temp 99.0°F | Resp 16 | Wt 98.8 lb

## 2017-12-27 VITALS — BP 96/59 | HR 74 | Temp 98.3°F | Resp 18

## 2017-12-27 DIAGNOSIS — C7971 Secondary malignant neoplasm of right adrenal gland: Secondary | ICD-10-CM | POA: Insufficient documentation

## 2017-12-27 DIAGNOSIS — Z79899 Other long term (current) drug therapy: Secondary | ICD-10-CM | POA: Insufficient documentation

## 2017-12-27 DIAGNOSIS — C3411 Malignant neoplasm of upper lobe, right bronchus or lung: Secondary | ICD-10-CM | POA: Diagnosis not present

## 2017-12-27 DIAGNOSIS — C7951 Secondary malignant neoplasm of bone: Secondary | ICD-10-CM

## 2017-12-27 DIAGNOSIS — E278 Other specified disorders of adrenal gland: Secondary | ICD-10-CM

## 2017-12-27 DIAGNOSIS — E119 Type 2 diabetes mellitus without complications: Secondary | ICD-10-CM

## 2017-12-27 DIAGNOSIS — Z5112 Encounter for antineoplastic immunotherapy: Secondary | ICD-10-CM | POA: Insufficient documentation

## 2017-12-27 DIAGNOSIS — C7972 Secondary malignant neoplasm of left adrenal gland: Secondary | ICD-10-CM | POA: Diagnosis not present

## 2017-12-27 LAB — COMPREHENSIVE METABOLIC PANEL
ALBUMIN: 4 g/dL (ref 3.5–5.0)
ALT: 10 U/L — ABNORMAL LOW (ref 14–54)
AST: 18 U/L (ref 15–41)
Alkaline Phosphatase: 70 U/L (ref 38–126)
Anion gap: 9 (ref 5–15)
BILIRUBIN TOTAL: 0.1 mg/dL — AB (ref 0.3–1.2)
BUN: 16 mg/dL (ref 6–20)
CHLORIDE: 104 mmol/L (ref 101–111)
CO2: 25 mmol/L (ref 22–32)
Calcium: 10.1 mg/dL (ref 8.9–10.3)
Creatinine, Ser: 1.2 mg/dL — ABNORMAL HIGH (ref 0.44–1.00)
GFR calc Af Amer: 55 mL/min — ABNORMAL LOW (ref >60)
GFR calc non Af Amer: 48 mL/min — ABNORMAL LOW (ref >60)
GLUCOSE: 102 mg/dL — AB (ref 65–99)
POTASSIUM: 4.1 mmol/L (ref 3.5–5.1)
SODIUM: 138 mmol/L (ref 135–145)
Total Protein: 8.2 g/dL — ABNORMAL HIGH (ref 6.5–8.1)

## 2017-12-27 LAB — CBC WITH DIFFERENTIAL/PLATELET
BASOS ABS: 0 10*3/uL (ref 0.0–0.1)
BASOS PCT: 0 %
EOS ABS: 0.2 10*3/uL (ref 0.0–0.7)
Eosinophils Relative: 2 %
HCT: 39.9 % (ref 36.0–46.0)
Hemoglobin: 12.5 g/dL (ref 12.0–15.0)
Lymphocytes Relative: 25 %
Lymphs Abs: 2.3 10*3/uL (ref 0.7–4.0)
MCH: 28.9 pg (ref 26.0–34.0)
MCHC: 31.3 g/dL (ref 30.0–36.0)
MCV: 92.4 fL (ref 78.0–100.0)
MONO ABS: 0.8 10*3/uL (ref 0.1–1.0)
Monocytes Relative: 9 %
NEUTROS PCT: 64 %
Neutro Abs: 6 10*3/uL (ref 1.7–7.7)
Platelets: 290 10*3/uL (ref 150–400)
RBC: 4.32 MIL/uL (ref 3.87–5.11)
RDW: 15.4 % (ref 11.5–15.5)
WBC: 9.3 10*3/uL (ref 4.0–10.5)

## 2017-12-27 LAB — TSH: TSH: 1.484 u[IU]/mL (ref 0.350–4.500)

## 2017-12-27 MED ORDER — HEPARIN SOD (PORK) LOCK FLUSH 100 UNIT/ML IV SOLN
500.0000 [IU] | Freq: Once | INTRAVENOUS | Status: AC | PRN
  Administered 2017-12-27: 500 [IU]

## 2017-12-27 MED ORDER — DRONABINOL 2.5 MG PO CAPS: 2.5000 mg | ORAL_CAPSULE | Freq: Two times a day (BID) | ORAL | 1 refills | Status: DC

## 2017-12-27 MED ORDER — SODIUM CHLORIDE 0.9 % IV SOLN
480.0000 mg | Freq: Once | INTRAVENOUS | Status: AC
  Administered 2017-12-27: 480 mg via INTRAVENOUS
  Filled 2017-12-27: qty 48

## 2017-12-27 MED ORDER — SODIUM CHLORIDE 0.9 % IV SOLN
Freq: Once | INTRAVENOUS | Status: AC
  Administered 2017-12-27: 11:00:00 via INTRAVENOUS

## 2017-12-27 MED ORDER — DENOSUMAB 120 MG/1.7ML ~~LOC~~ SOLN
120.0000 mg | Freq: Once | SUBCUTANEOUS | Status: AC
  Administered 2017-12-27: 120 mg via SUBCUTANEOUS
  Filled 2017-12-27: qty 1.7

## 2017-12-27 MED ORDER — SODIUM CHLORIDE 0.9% FLUSH
10.0000 mL | INTRAVENOUS | Status: DC | PRN
  Administered 2017-12-27: 10 mL
  Filled 2017-12-27: qty 10

## 2017-12-27 NOTE — Patient Instructions (Addendum)
[facility] Discharge Instructions for Patients Receiving Chemotherapy   Beginning January 23rd 2017 lab work for the [facility] will be done in the  Main lab at [facility] on 1st floor. If you have a lab appointment with the [facility] please come in thru the  Main Entrance and check in at the main information desk   Today you received the following chemotherapy agents Opdivo as well as Xgeva injection Follow-up as scheduled. Call clinic for any questions or concerns  To help prevent nausea and vomiting after your treatment, we encourage you to take your nausea medication   If you develop nausea and vomiting, or diarrhea that is not controlled by your medication, call the clinic.  The clinic phone number is [phone]. Office hours are Monday-Friday 8:30am-5:00pm.  BELOW ARE SYMPTOMS THAT SHOULD BE REPORTED IMMEDIATELY:  *FEVER GREATER THAN 101.0 F  *CHILLS WITH OR WITHOUT FEVER  NAUSEA AND VOMITING THAT IS NOT CONTROLLED WITH YOUR NAUSEA MEDICATION  *UNUSUAL SHORTNESS OF BREATH  *UNUSUAL BRUISING OR BLEEDING  TENDERNESS IN MOUTH AND THROAT WITH OR WITHOUT PRESENCE OF ULCERS  *URINARY PROBLEMS  *BOWEL PROBLEMS  UNUSUAL RASH Items with * indicate a potential emergency and should be followed up as soon as possible. If you have an emergency after office hours please contact your primary care physician or go to the nearest emergency department.  Please call the clinic during office hours if you have any questions or concerns.   You may also contact the Patient Navigator at [phone] should you have any questions or need assistance in obtaining follow up care.      Resources For Cancer Patients and their Caregivers ? American Cancer Society: Can assist with transportation, wigs, general needs, runs Look Good Feel Better.        [phone] ? Cancer Care: Provides financial assistance, online support groups, medication/co-pay assistance.   1-800-813-HOPE 281-133-8277) ? [facility] Assists [place] cancer patients and their families through emotional , educational and financial support.  928-821-9434 ? [place] DSS Where to apply for food stamps, Medicaid and utility assistance. [phone] ? [facility]: Transportation to medical appointments. [phone] ? Social Security Administration: May apply for disability if have a Stage IV cancer. 9073532864 [phone] ? [employer], Disability and Transit Services: Assists with nutrition, care and transit needs. [phone]

## 2017-12-27 NOTE — Patient Instructions (Signed)
[facility] at [facility] Discharge Instructions  RECOMMENDATIONS MADE BY THE CONSULTANT AND ANY TEST RESULTS WILL BE SENT TO YOUR REFERRING PHYSICIAN.  You were seen today by Dr. Peru  Thank you for choosing [facility] at [facility] to provide your oncology and hematology care.  To afford each patient quality time with our provider, please arrive at least 15 minutes before your scheduled appointment time.    If you have a lab appointment with the Cancer Center please come in thru the  Main Entrance and check in at the main information desk  You need to re-schedule your appointment should you arrive 10 or more minutes late.  We strive to give you quality time with our providers, and arriving late affects you and other patients whose appointments are after yours.  Also, if you no show three or more times for appointments you may be dismissed from the clinic at the providers discretion.     Again, thank you for choosing [facility].  Our hope is that these requests will decrease the amount of time that you wait before being seen by our physicians.       _____________________________________________________________  Should you have questions after your visit to [facility], please contact our office at [phone] between the hours of 8:30 a.m. and 4:30 p.m.  Voicemails left after 4:30 p.m. will not be returned until the following business day.  For prescription refill requests, have your pharmacy contact our office.       Resources For Cancer Patients and their Caregivers ? American Cancer Society: Can assist with transportation, wigs, general needs, runs Look Good Feel Better.        1-888-227-6333 ? Cancer Care: Provides financial assistance, online support groups, medication/co-pay assistance.  1-800-813-HOPE (4673) ? [facility] Assists [place] cancer patients and their families  through emotional , educational and financial support.  336-427-4357 ? [place] DSS Where to apply for food stamps, Medicaid and utility assistance. 336-342-1394 ? RCATS: Transportation to medical appointments. 336-347-2287 ? Social Security Administration: May apply for disability if have a Stage IV cancer. 336-342-7796 1-800-772-1213 ? [place] Aging, Disability and Transit Services: Assists with nutrition, care and transit needs. 336-349-2343  Cancer Center Support Programs: [email or web address]@ > Cancer Support Group  2nd Tuesday of the month 1pm-2pm, Journey Room  > Creative Journey  3rd Tuesday of the month 1130am-1pm, Journey Room  > Look Good Feel Better  1st Wednesday of the month 10am-12 noon, Journey Room (Call American Cancer Society to register 1-800-395-5775)     

## 2017-12-27 NOTE — Progress Notes (Signed)
1100 Labs reviewed with and pt seen by Dr. Sherrine Maples and pt approved for Opdivo infusion today per MD              Leonard Downing tolerated Opdivo infusion and Xgeva injection well without complaints or incident. Calcium 10.1 today and pt denied any tooth,jaw or leg pain and no recent or future dental appts as well. Pt continues to take her Calcium PO as prescribed without issues. VSS upon discharge. Pt discharged self ambulatory in satisfactory condition

## 2018-01-07 NOTE — Progress Notes (Signed)
Chief complaint:  Follow-up for stage IV non-small cell lung cancer patient is currently on nIVOLUMAB and denosumab therapy.  History of Presenting Illness: She denies any new problems today.  No cough no difficulty breathing no chest pain.  Her weight is stable appetite is normal.  She does complain of some fatigue.  No reported bleeding no change in her bowel habits.  No diarrhea.  Oncologic History:   Cancer of upper lobe of right lung ([place])   07/28/2014 Imaging    CT chest: Large R apical mass consistent with malignancy. This is destroying the R 2nd rib with extension into adjacent soft tissue. R hilar adenopathy with R 5cm adrenal metastatic lesion.      08/01/2014 Initial Biopsy    Lung, needle/core biopsy(ies), right upper lobe - POORLY DIFFERENTIATED ADENOCARCINOMA, SEE COMMENT.      08/08/2014 PET scan    Large hypermetabolic R apical mass with evidence of direct chest wall and mediastinal invasion, right retrocrural lymphadenopathy, extensive retroperitoneal lymphadenopathy, and metastatic lesions to the adrenal glands       09/02/2014 - 11/04/2014 Chemotherapy    Cisplatin/Pemetrexed/Avastin every 21 days x 4 cycles      10/07/2014 - 10/27/2014 Radiation Therapy    Right lung apex for control of brachioplexopathy.      12/24/2014 - 02/25/2015 Chemotherapy    Alimta/Avastin every 21 days.      02/20/2015 Imaging    Increase in size of right adrenal metastasis and subjacent confluent retrocaval lymphadenopathy      02/25/2015 -  Chemotherapy    Nivolumab, zometa      05/04/2015 Imaging    CT CAP- Stable to slight decrease in the posterior right apical lesion. Stable appearance of posterior right upper rib an upper thoracic bony lesions. Slight improvement in right upper lobe tree-in-bud opacity. No new or progressive findings in...      07/28/2015 Imaging    CT CAP- Reduced size of the right apical pleural parenchymal lesion and reduced size of the right adrenal metastatic  lesion. Resolution of prior retrocrural adenopathy.  Right eccentric T1 and T2 sclerosis with sclerosis and tapering of the right second..      11/17/2015 Imaging    CT CAP- Stable soft tissue thickening in the apex of the right hemi thorax. Stable right adrenal metastasis. Nodularity along the trachea and mainstem bronchi, relatively new from 07/28/2015, favoring adherent debris.      11/18/2015 Treatment Plan Change    Zometa HELD for upcoming tooth extraction      11/24/2015 Treatment Plan Change    Zometa on hold at this time in preparation for tooth extraction in March 2017.  Zometa las given on 11/18/2015.      02/03/2016 Imaging    CT CAP- Heterogeneous right apical masslike consolidation and right adrenal metastasis are unchanged      04/06/2016 Treatment Plan Change    Zometa restarted 6 weeks out from tooth extraction (04/06/2016)      05/12/2016 Imaging    CT CAP- NED in the chest, abdomen or pelvis.  Some areas of nodularity associated with the mainstem bronchi in the left upper lobe bronchus, favored to represent adherent inspissated secretions      08/17/2016 Imaging    CT CAP- 1. Stable CTs of the chest and abdomen. No evidence of progressive metastatic disease. 2. Probable treated tumor at the right apex, right adrenal gland and T2 vertebral body, stable. 3. Fluctuating nodularity along the walls of the trachea and mainstem  bronchi, likely secretions.      12/12/2016 Imaging    Further decrease in size of treated tumor within the right apex. 2. Stable treated tumor involving the right second rib and T2 vertebra. 3. Stable right adrenal gland treated tumor. 4. Emphysema 5. Aortic atherosclerosis      01/04/2017 Imaging    MRI brain- Normal brain MRI.  No intracranial metastatic disease.      03/15/2017 Imaging    CT CAP- 1. No new or progressive metastatic disease in the chest or abdomen. 2. Stable treated tumor in the apical right upper lobe. Stable treated  right posterior second rib and right T2 vertebral lesions. Stable treated right adrenal metastasis. 3. Aortic atherosclerosis. 4. Moderate emphysema with mild diffuse bronchial wall thickening, suggesting COPD.      06/12/2017 Imaging    CT CAP 1. Similar appearance of treated primary within the right apex. 2. Similar areas of sclerosis within the right second rib, T2, and less so T1 vertebral bodies. These are most consistent with treated metastasis. 3. Similar right adrenal treated metastasis. 4. No evidence of new or progressive disease. 5. Similar right and progressive left areas of bronchial wall thickening and probable mucoid impaction. Correlate with interval infectious symptoms. 6.  Emphysema (ICD10-J43.9). 7. Coronary artery atherosclerosis. Aortic Atherosclerosis (ICD10-I70.0).       10/16/2017 Imaging    CT CAP 1. Stable appearance of the prior Pancoast tumor and related bony findings in the right second rib and right T1 and T2 vertebra compatible with successfully treated tumor. No significant enlargement or new lesions are identified. Similarly the treated right adrenal metastatic lesion is stable in appearance. 2. Upper normal size right hilar lymph node may warrant surveillance. Currently 9 mm in short axis. 3. Other imaging findings of potential clinical significance: Aortic Atherosclerosis (ICD10-I70.0) and Emphysema (ICD10-J43.9). Scattered proximal sigmoid colon diverticula. Mucus plugging medially in the left upper lobe and posteriorly in the right upper lobe.        Medical History: Past Medical History:  Diagnosis Date  . Adrenal mass, right ([place]) 07/28/2014  . Anemia   . Bone metastases ([place]) 04/05/2016  . Cancer ([place])    lung  right  . Diabetes mellitus without complication ([place])   . GERD (gastroesophageal reflux disease)   . Hyperlipidemia   . Hypertension   . Hypothyroidism due to medication 01/30/2017  . Lung mass 07/28/2014  . Reflux      Surgical History:   Medications:    Review of Systems: Review of Systems  Constitutional: Positive for fatigue.  All other systems reviewed and are negative.    PHYSICAL EXAMINATION Blood pressure 107/76, pulse 92, temperature 99 F (37.2 C), temperature source Oral, resp. rate 16, weight 98 lb 12.8 oz (44.8 kg), SpO2 98 %.  ECOG PERFORMANCE STATUS: 1 - Symptomatic but completely ambulatory  Physical Exam  Constitutional: She is oriented to person, place, and time and well-developed, well-nourished, and in no distress. No distress.  HENT:  Head: Normocephalic and atraumatic.  Mouth/Throat: Oropharynx is clear and moist.  Eyes: No scleral icterus.  Cardiovascular: Normal rate.  Pulmonary/Chest: No respiratory distress.  Musculoskeletal: She exhibits no edema.  Neurological: She is alert and oriented to person, place, and time. She has normal reflexes.  Skin: She is not diaphoretic.  Vitals reviewed.  Assessment and plan: Metastatic non-small cell carcinoma lung.  Patient tolerating all p.o. infusion well with no side effects.  Most recent scan from November 2018 showed stable disease we  will proceed with Opdivo today.  her dose has been increased and is being given q. 4 weeks.  Thyroid function is normal.  CBC and CMP are stable. She will continue denosumab injection monthly for known metastatic bone disease.Proceed with Xgeva injection today. We will request follow-up CT chest abdomen and pelvis prior to her next visit.    Creola Corn, MD

## 2018-01-19 ENCOUNTER — Ambulatory Visit (HOSPITAL_COMMUNITY)
Admission: RE | Admit: 2018-01-19 | Discharge: 2018-01-19 | Disposition: A | Discharge status: Home / Self Care | Payer: 59 | Source: Ambulatory Visit | Attending: Internal Medicine | Admitting: Internal Medicine

## 2018-01-19 DIAGNOSIS — C3411 Malignant neoplasm of upper lobe, right bronchus or lung: Secondary | ICD-10-CM | POA: Diagnosis present

## 2018-01-19 DIAGNOSIS — J439 Emphysema, unspecified: Secondary | ICD-10-CM | POA: Insufficient documentation

## 2018-01-19 DIAGNOSIS — C7971 Secondary malignant neoplasm of right adrenal gland: Secondary | ICD-10-CM | POA: Diagnosis not present

## 2018-01-19 DIAGNOSIS — I700 Atherosclerosis of aorta: Secondary | ICD-10-CM | POA: Insufficient documentation

## 2018-01-19 MED ORDER — IOPAMIDOL (ISOVUE-300) INJECTION 61%
100.0000 mL | Freq: Once | INTRAVENOUS | Status: AC | PRN
  Administered 2018-01-19: 100 mL via INTRAVENOUS

## 2018-01-24 ENCOUNTER — Encounter (HOSPITAL_COMMUNITY): Payer: Self-pay

## 2018-01-24 ENCOUNTER — Inpatient Hospital Stay (HOSPITAL_COMMUNITY): Payer: 59 | Attending: Internal Medicine

## 2018-01-24 ENCOUNTER — Inpatient Hospital Stay (HOSPITAL_COMMUNITY): Payer: 59

## 2018-01-24 ENCOUNTER — Other Ambulatory Visit: Payer: Self-pay

## 2018-01-24 ENCOUNTER — Encounter (HOSPITAL_COMMUNITY): Payer: Self-pay | Admitting: Internal Medicine

## 2018-01-24 ENCOUNTER — Inpatient Hospital Stay (HOSPITAL_BASED_OUTPATIENT_CLINIC_OR_DEPARTMENT_OTHER): Payer: 59 | Admitting: Internal Medicine

## 2018-01-24 VITALS — BP 107/60 | HR 76 | Temp 98.0°F | Resp 18 | Wt 95.0 lb

## 2018-01-24 DIAGNOSIS — C7971 Secondary malignant neoplasm of right adrenal gland: Secondary | ICD-10-CM | POA: Insufficient documentation

## 2018-01-24 DIAGNOSIS — Z5112 Encounter for antineoplastic immunotherapy: Secondary | ICD-10-CM | POA: Insufficient documentation

## 2018-01-24 DIAGNOSIS — C3411 Malignant neoplasm of upper lobe, right bronchus or lung: Secondary | ICD-10-CM

## 2018-01-24 DIAGNOSIS — E278 Other specified disorders of adrenal gland: Secondary | ICD-10-CM

## 2018-01-24 DIAGNOSIS — F17210 Nicotine dependence, cigarettes, uncomplicated: Secondary | ICD-10-CM | POA: Diagnosis not present

## 2018-01-24 DIAGNOSIS — C7951 Secondary malignant neoplasm of bone: Secondary | ICD-10-CM

## 2018-01-24 DIAGNOSIS — I10 Essential (primary) hypertension: Secondary | ICD-10-CM | POA: Insufficient documentation

## 2018-01-24 DIAGNOSIS — E279 Disorder of adrenal gland, unspecified: Secondary | ICD-10-CM | POA: Diagnosis not present

## 2018-01-24 DIAGNOSIS — I2510 Atherosclerotic heart disease of native coronary artery without angina pectoris: Secondary | ICD-10-CM | POA: Insufficient documentation

## 2018-01-24 DIAGNOSIS — Z79899 Other long term (current) drug therapy: Secondary | ICD-10-CM | POA: Diagnosis not present

## 2018-01-24 DIAGNOSIS — E119 Type 2 diabetes mellitus without complications: Secondary | ICD-10-CM | POA: Insufficient documentation

## 2018-01-24 DIAGNOSIS — C3490 Malignant neoplasm of unspecified part of unspecified bronchus or lung: Secondary | ICD-10-CM

## 2018-01-24 LAB — CBC WITH DIFFERENTIAL/PLATELET
BASOS PCT: 0 %
Basophils Absolute: 0 10*3/uL (ref 0.0–0.1)
EOS ABS: 0.1 10*3/uL (ref 0.0–0.7)
Eosinophils Relative: 1 %
HCT: 39.6 % (ref 36.0–46.0)
HEMOGLOBIN: 12.5 g/dL (ref 12.0–15.0)
LYMPHS ABS: 1.7 10*3/uL (ref 0.7–4.0)
Lymphocytes Relative: 26 %
MCH: 28.9 pg (ref 26.0–34.0)
MCHC: 31.6 g/dL (ref 30.0–36.0)
MCV: 91.5 fL (ref 78.0–100.0)
Monocytes Absolute: 0.5 10*3/uL (ref 0.1–1.0)
Monocytes Relative: 7 %
NEUTROS PCT: 66 %
Neutro Abs: 4.3 10*3/uL (ref 1.7–7.7)
Platelets: 285 10*3/uL (ref 150–400)
RBC: 4.33 MIL/uL (ref 3.87–5.11)
RDW: 15.5 % (ref 11.5–15.5)
WBC: 6.6 10*3/uL (ref 4.0–10.5)

## 2018-01-24 LAB — COMPREHENSIVE METABOLIC PANEL
ALBUMIN: 4 g/dL (ref 3.5–5.0)
ALK PHOS: 67 U/L (ref 38–126)
ALT: 10 U/L — AB (ref 14–54)
AST: 18 U/L (ref 15–41)
Anion gap: 9 (ref 5–15)
BUN: 15 mg/dL (ref 6–20)
CALCIUM: 9.9 mg/dL (ref 8.9–10.3)
CO2: 25 mmol/L (ref 22–32)
CREATININE: 1.23 mg/dL — AB (ref 0.44–1.00)
Chloride: 102 mmol/L (ref 101–111)
GFR calc Af Amer: 54 mL/min — ABNORMAL LOW (ref >60)
GFR calc non Af Amer: 46 mL/min — ABNORMAL LOW (ref >60)
Glucose, Bld: 134 mg/dL — ABNORMAL HIGH (ref 65–99)
Potassium: 4.1 mmol/L (ref 3.5–5.1)
SODIUM: 136 mmol/L (ref 135–145)
Total Bilirubin: 0.5 mg/dL (ref 0.3–1.2)
Total Protein: 7.8 g/dL (ref 6.5–8.1)

## 2018-01-24 LAB — LACTATE DEHYDROGENASE: LDH: 109 U/L (ref 98–192)

## 2018-01-24 MED ORDER — SODIUM CHLORIDE 0.9 % IV SOLN
480.0000 mg | Freq: Once | INTRAVENOUS | Status: AC
  Administered 2018-01-24: 480 mg via INTRAVENOUS
  Filled 2018-01-24: qty 48

## 2018-01-24 MED ORDER — SODIUM CHLORIDE 0.9 % IV SOLN
INTRAVENOUS | Status: DC
  Administered 2018-01-24: 11:00:00 via INTRAVENOUS

## 2018-01-24 MED ORDER — SODIUM CHLORIDE 0.9 % IV SOLN: Freq: Once | INTRAVENOUS | Status: DC

## 2018-01-24 MED ORDER — SODIUM CHLORIDE 0.9% FLUSH
10.0000 mL | INTRAVENOUS | Status: DC | PRN
  Administered 2018-01-24: 10 mL
  Filled 2018-01-24: qty 10

## 2018-01-24 MED ORDER — DENOSUMAB 120 MG/1.7ML ~~LOC~~ SOLN
120.0000 mg | Freq: Once | SUBCUTANEOUS | Status: AC
  Administered 2018-01-24: 120 mg via SUBCUTANEOUS
  Filled 2018-01-24: qty 1.7

## 2018-01-24 MED ORDER — HEPARIN SOD (PORK) LOCK FLUSH 100 UNIT/ML IV SOLN
500.0000 [IU] | Freq: Once | INTRAVENOUS | Status: AC | PRN
  Administered 2018-01-24: 500 [IU]

## 2018-01-24 MED ORDER — MAGIC MOUTHWASH: 5.0000 mL | Freq: Four times a day (QID) | ORAL | 2 refills | Status: DC | PRN

## 2018-01-24 NOTE — Patient Instructions (Addendum)
[facility] Discharge Instructions for Patients Receiving Chemotherapy   Beginning January 23rd 2017 lab work for the [facility] will be done in the  Main lab at [facility] on 1st floor. If you have a lab appointment with the [facility] please come in thru the  Main Entrance and check in at the main information desk   Today you received the following chemotherapy agents Opdivo as well as Xgeva injection today. Follow-up as scheduled. Call clinic for any questions or concerns  To help prevent nausea and vomiting after your treatment, we encourage you to take your nausea medication   If you develop nausea and vomiting, or diarrhea that is not controlled by your medication, call the clinic.  The clinic phone number is [phone]. Office hours are Monday-Friday 8:30am-5:00pm.  BELOW ARE SYMPTOMS THAT SHOULD BE REPORTED IMMEDIATELY:  *FEVER GREATER THAN 101.0 F  *CHILLS WITH OR WITHOUT FEVER  NAUSEA AND VOMITING THAT IS NOT CONTROLLED WITH YOUR NAUSEA MEDICATION  *UNUSUAL SHORTNESS OF BREATH  *UNUSUAL BRUISING OR BLEEDING  TENDERNESS IN MOUTH AND THROAT WITH OR WITHOUT PRESENCE OF ULCERS  *URINARY PROBLEMS  *BOWEL PROBLEMS  UNUSUAL RASH Items with * indicate a potential emergency and should be followed up as soon as possible. If you have an emergency after office hours please contact your primary care physician or go to the nearest emergency department.  Please call the clinic during office hours if you have any questions or concerns.   You may also contact the Patient Navigator at [phone] should you have any questions or need assistance in obtaining follow up care.      Resources For Cancer Patients and their Caregivers ? American Cancer Society: Can assist with transportation, wigs, general needs, runs Look Good Feel Better.        458-648-0553 ? Cancer Care: Provides financial assistance, online support groups, medication/co-pay  assistance.  1-800-813-HOPE 972-280-1104) ? [facility] Assists [place] cancer patients and their families through emotional , educational and financial support.  [phone] ? [place] DSS Where to apply for food stamps, Medicaid and utility assistance. 414-085-9216 ? RCATS: Transportation to medical appointments. 321-504-9310 ? Social Security Administration: May apply for disability if have a Stage IV cancer. [phone] [phone] ? [employer], Disability and Transit Services: Assists with nutrition, care and transit needs. 475-834-1349

## 2018-01-24 NOTE — Patient Instructions (Addendum)
Melissa Sullivan at [facility] Discharge Instructions   You were seen today by Dr. Zoila Shutter. Dr. Walden Field went over your most recent CT scan that is stable at this time.  She wants you to continue taking your treatments as planned. Return in 1 months for labs, treatment and follow up.    Thank you for choosing [place] at [facility] to provide your oncology and hematology care.  To afford each patient quality time with our provider, please arrive at least 15 minutes before your scheduled appointment time.    If you have a lab appointment with the [facility] please come in thru the  Main Entrance and check in at the main information desk  You need to re-schedule your appointment should you arrive 10 or more minutes late.  We strive to give you quality time with our providers, and arriving late affects you and other patients whose appointments are after yours.  Also, if you no show three or more times for appointments you may be dismissed from the clinic at the providers discretion.     Again, thank you for choosing [facility].  Our hope is that these requests will decrease the amount of time that you wait before being seen by our physicians.       _____________________________________________________________  Should you have questions after your visit to [facility], please contact our office at [phone] between the hours of 8:30 a.m. and 4:30 p.m.  Voicemails left after 4:30 p.m. will not be returned until the following business day.  For prescription refill requests, have your pharmacy contact our office.       Resources For Cancer Patients and their Caregivers ? American Cancer Society: Can assist with transportation, wigs, general needs, runs Look Good Feel Better.        ([phone] [phone] ? Cancer Care: Provides financial assistance, online support groups, medication/co-pay assistance.  1-800-813-HOPE  [phone]) ? [facility] Assists [place] cancer patients and their families through emotional , educational and financial support.  201-144-6892 ? [place] DSS Where to apply for food stamps, Medicaid and utility assistance. 7752121074 ? RCATS: Transportation to medical appointments. 581-527-1561 ? Social Security Administration: May apply for disability if have a Stage IV cancer. [phone] 5138558447 ? [employer], Disability and Transit Services: Assists with nutrition, care and transit needs. [place] Support Programs:   > Cancer Support Group  2nd Tuesday of the month 1pm-2pm, Journey Room   > Creative Journey  3rd Tuesday of the month 1130am-1pm, Journey Room

## 2018-01-24 NOTE — Progress Notes (Signed)
Melissa Sullivan tolerated Opdivo infusion and Xgeva injection well without complaints or incident. Labs reviewed with and pt seen by Dr. Walden Field prior to administering these medications. Calcium 9.9 today and pt denied any tooth or jaw pain and no recent or future dental appts. VSS upon discharge. Pt discharged self ambulatory in satisfactory condition accompanied by family member

## 2018-01-25 ENCOUNTER — Telehealth (HOSPITAL_COMMUNITY): Payer: Self-pay | Admitting: Emergency Medicine

## 2018-01-25 ENCOUNTER — Other Ambulatory Visit (HOSPITAL_COMMUNITY): Payer: Self-pay | Admitting: Adult Health

## 2018-01-25 ENCOUNTER — Encounter (HOSPITAL_COMMUNITY): Payer: Self-pay | Admitting: Adult Health

## 2018-01-25 DIAGNOSIS — C3411 Malignant neoplasm of upper lobe, right bronchus or lung: Secondary | ICD-10-CM

## 2018-01-25 MED ORDER — OXYCODONE HCL 10 MG PO TABS: 10.0000 mg | ORAL_TABLET | Freq: Four times a day (QID) | ORAL | 0 refills | Status: DC | PRN

## 2018-01-25 NOTE — Progress Notes (Signed)
Patient called cancer center requesting refill of Oxycodone.   [place] Controlled Substance Reporting System reviewed and refill is appropriate on or after 01/25/18. Medication e-scribed to her pharmacy [facility]) using [employer]'s 2-step verification process.    [place] reviewed:     Mike Craze, NP [place] [phone]

## 2018-01-25 NOTE — Telephone Encounter (Signed)
Rx e-scribed to her pharmacy.   Mike Craze, NP [place] [phone]

## 2018-01-26 ENCOUNTER — Other Ambulatory Visit (HOSPITAL_COMMUNITY): Payer: Self-pay | Admitting: Adult Health

## 2018-01-29 ENCOUNTER — Telehealth (HOSPITAL_COMMUNITY): Payer: Self-pay | Admitting: Emergency Medicine

## 2018-01-31 ENCOUNTER — Other Ambulatory Visit (HOSPITAL_COMMUNITY): Payer: Self-pay | Admitting: Adult Health

## 2018-01-31 DIAGNOSIS — C3411 Malignant neoplasm of upper lobe, right bronchus or lung: Secondary | ICD-10-CM

## 2018-01-31 MED ORDER — OXYCODONE HCL 10 MG PO TABS: 10.0000 mg | ORAL_TABLET | Freq: Four times a day (QID) | ORAL | 0 refills | Status: DC | PRN

## 2018-01-31 NOTE — Telephone Encounter (Signed)
Rx sent to [facility]  Please call [facility] and cancel previous prescription.   Mike Craze, NP [place] [phone]

## 2018-01-31 NOTE — Telephone Encounter (Signed)
Cancelled the prescription with Richardson Landry at [facility] in [place].

## 2018-02-02 ENCOUNTER — Other Ambulatory Visit (HOSPITAL_COMMUNITY): Payer: Self-pay | Admitting: Adult Health

## 2018-02-06 ENCOUNTER — Other Ambulatory Visit (HOSPITAL_COMMUNITY): Payer: Self-pay | Admitting: Adult Health

## 2018-02-06 DIAGNOSIS — C3411 Malignant neoplasm of upper lobe, right bronchus or lung: Secondary | ICD-10-CM

## 2018-02-06 DIAGNOSIS — R630 Anorexia: Secondary | ICD-10-CM

## 2018-02-06 MED ORDER — DRONABINOL 2.5 MG PO CAPS: 2.5000 mg | ORAL_CAPSULE | Freq: Two times a day (BID) | ORAL | 1 refills | Status: DC

## 2018-02-19 NOTE — Progress Notes (Signed)
Diagnosis Cancer of upper lobe of right lung ([place]) - Plan: magic mouthwash SOLN, DISCONTINUED: sodium chloride flush (NS) 0.9 % injection 10 mL, DISCONTINUED: heparin lock flush 100 unit/mL, DISCONTINUED: 0.9 %  sodium chloride infusion, DISCONTINUED: nivolumab (OPDIVO) 480 mg in sodium chloride 0.9 % 100 mL chemo infusion  Adrenal mass, right ([facility]) - Plan: magic mouthwash SOLN, DISCONTINUED: sodium chloride flush (NS) 0.9 % injection 10 mL, DISCONTINUED: heparin lock flush 100 unit/mL, DISCONTINUED: 0.9 %  sodium chloride infusion, DISCONTINUED: nivolumab (OPDIVO) 480 mg in sodium chloride 0.9 % 100 mL chemo infusion  Staging Cancer Staging No matching staging information was found for the patient.  Assessment and Plan:  1.  Stage IV NSCLC.  62 y.o. 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  Currently undergoing palliative systemic therapy with Nivo which she is tolerating reasonably well.  Denosumab is being administered for skeletal metastatic disease.  She is here to go over CT CAP done 01/19/2018 that shows  IMPRESSION: 1. Unchanged appearance of the chest abdomen and pelvis with stable RIGHT apical soft mass, RIGHT adrenal metastasis, and unchanged T1 and T2 vertebral lesions. 2. No evidence of new or progressive malignancy. 3. Aortic Atherosclerosis (ICD10-I70.0) and Emphysema (ICD10-J43.9).  Pt will continue Nivo 480 mg monthly and will continue denosumab monthly  She will have repeat imaging in 04/2018.    2.  Bone mets.  Continue Xgeva.   3.  Mouth discomfort.  She is given Rx for Magic mouth wash.    Current Status:  Pt is seen today for follow-up prior to [place].      Cancer of upper lobe of right lung ([place])   07/28/2014 Imaging    CT chest: Large R apical mass consistent with malignancy. This is destroying the R 2nd rib with extension into adjacent soft tissue. R hilar adenopathy with R 5cm adrenal metastatic lesion.      08/01/2014  Initial Biopsy    Lung, needle/core biopsy(ies), right upper lobe - POORLY DIFFERENTIATED ADENOCARCINOMA, SEE COMMENT.      08/08/2014 PET scan    Large hypermetabolic R apical mass with evidence of direct chest wall and mediastinal invasion, right retrocrural lymphadenopathy, extensive retroperitoneal lymphadenopathy, and metastatic lesions to the adrenal glands       09/02/2014 - 11/04/2014 Chemotherapy    Cisplatin/Pemetrexed/Avastin every 21 days x 4 cycles      10/07/2014 - 10/27/2014 Radiation Therapy    Right lung apex for control of brachioplexopathy.      12/24/2014 - 02/25/2015 Chemotherapy    Alimta/Avastin every 21 days.      02/20/2015 Imaging    Increase in size of right adrenal metastasis and subjacent confluent retrocaval lymphadenopathy      02/25/2015 -  Chemotherapy    Nivolumab, zometa      05/04/2015 Imaging    CT CAP- Stable to slight decrease in the posterior right apical lesion. Stable appearance of posterior right upper rib an upper thoracic bony lesions. Slight improvement in right upper lobe tree-in-bud opacity. No new or progressive findings in...      07/28/2015 Imaging    CT CAP- Reduced size of the right apical pleural parenchymal lesion and reduced size of the right adrenal metastatic lesion. Resolution of prior retrocrural adenopathy.  Right eccentric T1 and T2 sclerosis with sclerosis and tapering of the right second..      11/17/2015 Imaging    CT CAP- Stable soft tissue thickening in the apex of the  right hemi thorax. Stable right adrenal metastasis. Nodularity along the trachea and mainstem bronchi, relatively new from 07/28/2015, favoring adherent debris.      11/18/2015 Treatment Plan Change    Zometa HELD for upcoming tooth extraction      11/24/2015 Treatment Plan Change    Zometa on hold at this time in preparation for tooth extraction in March 2017.  Zometa las given on 11/18/2015.      02/03/2016 Imaging    CT CAP- Heterogeneous right  apical masslike consolidation and right adrenal metastasis are unchanged      04/06/2016 Treatment Plan Change    Zometa restarted 6 weeks out from tooth extraction (04/06/2016)      05/12/2016 Imaging    CT CAP- NED in the chest, abdomen or pelvis.  Some areas of nodularity associated with the mainstem bronchi in the left upper lobe bronchus, favored to represent adherent inspissated secretions      08/17/2016 Imaging    CT CAP- 1. Stable CTs of the chest and abdomen. No evidence of progressive metastatic disease. 2. Probable treated tumor at the right apex, right adrenal gland and T2 vertebral body, stable. 3. Fluctuating nodularity along the walls of the trachea and mainstem bronchi, likely secretions.      12/12/2016 Imaging    Further decrease in size of treated tumor within the right apex. 2. Stable treated tumor involving the right second rib and T2 vertebra. 3. Stable right adrenal gland treated tumor. 4. Emphysema 5. Aortic atherosclerosis      01/04/2017 Imaging    MRI brain- Normal brain MRI.  No intracranial metastatic disease.      03/15/2017 Imaging    CT CAP- 1. No new or progressive metastatic disease in the chest or abdomen. 2. Stable treated tumor in the apical right upper lobe. Stable treated right posterior second rib and right T2 vertebral lesions. Stable treated right adrenal metastasis. 3. Aortic atherosclerosis. 4. Moderate emphysema with mild diffuse bronchial wall thickening, suggesting COPD.      06/12/2017 Imaging    CT CAP 1. Similar appearance of treated primary within the right apex. 2. Similar areas of sclerosis within the right second rib, T2, and less so T1 vertebral bodies. These are most consistent with treated metastasis. 3. Similar right adrenal treated metastasis. 4. No evidence of new or progressive disease. 5. Similar right and progressive left areas of bronchial wall thickening and probable mucoid impaction. Correlate with  interval infectious symptoms. 6.  Emphysema (ICD10-J43.9). 7. Coronary artery atherosclerosis. Aortic Atherosclerosis (ICD10-I70.0).       10/16/2017 Imaging    CT CAP 1. Stable appearance of the prior Pancoast tumor and related bony findings in the right second rib and right T1 and T2 vertebra compatible with successfully treated tumor. No significant enlargement or new lesions are identified. Similarly the treated right adrenal metastatic lesion is stable in appearance. 2. Upper normal size right hilar lymph node may warrant surveillance. Currently 9 mm in short axis. 3. Other imaging findings of potential clinical significance: Aortic Atherosclerosis (ICD10-I70.0) and Emphysema (ICD10-J43.9). Scattered proximal sigmoid colon diverticula. Mucus plugging medially in the left upper lobe and posteriorly in the right upper lobe.         Problem List Patient Active Problem List   Diagnosis Date Noted  . Hypothyroidism due to medication [E03.2] 01/30/2017  . Portacath in place [facility] 06/30/2016  . Tobacco use [Z72.0] 06/30/2016  . Bone metastases ([place]) [C79.51] 04/05/2016  . Encounter for screening colonoscopy [Z12.11] 03/06/2015  .  Dysphagia [R13.10] 03/04/2015  . Anemia in neoplastic disease [D63.0] 01/14/2015  . Hypercalcemia [E83.52] 12/24/2014  . Oropharyngeal dysphagia [R13.12] 12/05/2014  . Nausea and vomiting [R11.2] 12/05/2014  . ARF (acute renal failure) ([place]) [N17.9] 12/04/2014  . Abnormal finding on imaging [R93.89] 12/04/2014  . Cancer of upper lobe of right lung ([place]) [C34.11] 08/14/2014  . Adrenal mass, right ([place]) [E27.9] 07/28/2014    Past Medical History Past Medical History:  Diagnosis Date  . Adrenal mass, right ([place]) 07/28/2014  . Anemia   . Bone metastases ([place]) 04/05/2016  . Cancer ([place])    lung  right  . Diabetes mellitus without complication ([place])   . GERD (gastroesophageal reflux disease)   . Hyperlipidemia   . Hypertension   .  Hypothyroidism due to medication 01/30/2017  . Lung mass 07/28/2014  . Reflux     Past Surgical History Past Surgical History:  Procedure Laterality Date  . AMPUTATION Right 02/22/2017   Procedure: PARTIAL AMPUTATION RIGHT GREAT TOE;  Surgeon: Caprice Beaver, DPM;  Location: [facility] OR[facility];  Service: Podiatry;  Laterality: Right;  . APPENDECTOMY    . ESOPHAGOGASTRODUODENOSCOPY N/A 12/09/2014   HEN:IDPOEU esophageal stricture/mild-to-noderate erosive gastritis. negative H.pylori  . FLEXIBLE SIGMOIDOSCOPY  2011   Dr. [name]: hyperplastic polyp  . LUNG BIOPSY Right 07/2014   CT guided  . MALONEY DILATION N/A 12/09/2014   Procedure: [name] DILATION;  Surgeon: [name], MD;  Location: [facility] SU[facility];  Service: Endoscopy;  Laterality: N/A;  . PORTACATH PLACEMENT Left 09/01/2014  . SAVORY DILATION N/A 12/09/2014   Procedure: SAVORY DILATION;  Surgeon: [name], MD;  Location: [facility];  Service: Endoscopy;  Laterality: N/A;    Family History Family History  Problem Relation Age of Onset  . Cancer Sister   . Colon cancer Neg Hx      Social History  reports that she has been smoking cigarettes.  She has a 6.00 pack-year smoking history. She has never used smokeless tobacco. She reports that she does not drink alcohol or use drugs.  Medications  Current Outpatient Medications:  .  Calcium Carb-Cholecalciferol (CALCIUM 1000 + D PO), Take 1 tablet by mouth daily., Disp: , Rfl:  .  Fluticasone-Salmeterol (ADVAIR DISKUS) 500-50 MCG/DOSE AEPB, Inhale 1 puff into the lungs 2 (two) times daily. (Patient taking differently: Inhale 1 puff into the lungs 2 (two) times daily as needed (for respiratory issues.). ), Disp: 60 each, Rfl: 6 .  ibuprofen (ADVIL,MOTRIN) 200 MG tablet, Take 400 mg by mouth every 8 (eight) hours as needed (for pain.)., Disp: , Rfl:  .  levothyroxine (SYNTHROID, LEVOTHROID) 25 MCG tablet, TAKE 1 TABLET BY MOUTH ONCE DAILY BEFORE BREAKFAST, Disp: 30 tablet, Rfl: 1 .   lidocaine-prilocaine (EMLA) cream, APPLY A QUARTER SIZE AMOUNT TO PORT SITE 1 HOUR PRIOR TO CHEMO.  DO NOT RUB IN.  COVER WITH PLASTIC WRAP, Disp: 30 g, Rfl: 5 .  magic mouthwash SOLN, Take 5 mLs by mouth 4 (four) times daily as needed for mouth pain., Disp: 480 mL, Rfl: 2 .  omeprazole (PRILOSEC) 40 MG capsule, Take 1 capsule (40 mg total) by mouth daily., Disp: 90 capsule, Rfl: 3 .  ondansetron (ZOFRAN) 8 MG tablet, Take 1 tablet (8 mg total) by mouth every 8 (eight) hours as needed for nausea or vomiting., Disp: 60 tablet, Rfl: 1 .  simvastatin (ZOCOR) 40 MG tablet, TAKE ONE TABLET BY MOUTH ONCE DAILY IN THE MORNING, Disp: 30 tablet, Rfl: 6 .  denosumab (XGEVA) 120 MG/1.7ML SOLN injection, Inject 120 mg into the skin every 28 (twenty-eight) days., Disp: , Rfl:  .  dronabinol (MARINOL) 2.5 MG capsule, Take 1 capsule (2.5 mg total) by mouth 2 (two) times daily before lunch and supper., Disp: 60 capsule, Rfl: 1 .  ENSURE (ENSURE), Take 1 Can by mouth 4 (four) times daily., Disp: , Rfl:  .  Nivolumab (OPDIVO IV), Inject into the vein every 28 (twenty-eight) days., Disp: , Rfl:  .  Oxycodone HCl 10 MG TABS, Take 1-2 tablets (10-20 mg total) by mouth every 6 (six) hours as needed (pain.)., Disp: 100 tablet, Rfl: 0  Allergies Patient has no known allergies.  Review of Systems Review of Systems - Oncology ROS as per HPI otherwise 12 point ROS is negative.   Physical Exam  Vitals Wt Readings from Last 3 Encounters:  01/24/18 95 lb (43.1 kg)  12/27/17 98 lb 12.8 oz (44.8 kg)  11/29/17 98 lb (44.5 kg)   Temp Readings from Last 3 Encounters:  01/24/18 98 F (36.7 C) (Oral)  12/27/17 98.3 F (36.8 C) (Oral)  12/27/17 99 F (37.2 C) (Oral)   BP Readings from Last 3 Encounters:  01/24/18 107/60  12/27/17 (!) 96/59  12/27/17 107/76   Pulse Readings from Last 3 Encounters:  01/24/18 76  12/27/17 74  12/27/17 92     Constitutional: Well-developed, well-nourished, and in no distress.    HENT: Head: Normocephalic and atraumatic.  Mouth/Throat: No oropharyngeal exudate. Mucosa moist. Eyes: Pupils are equal, round, and reactive to light. Conjunctivae are normal. No scleral icterus.  Neck: Normal range of motion. Neck supple. No JVD present.  Cardiovascular: Normal rate, regular rhythm and normal heart sounds.  Exam reveals no gallop and no friction rub.   No murmur heard. Pulmonary/Chest: Coarse BS.   Abdominal: Soft. Bowel sounds are normal. No distension. There is no tenderness. There is no guarding.  Musculoskeletal: No edema or tenderness.  Lymphadenopathy: No cervical, axillary or supraclavicular adenopathy.  Neurological: Alert and oriented to person, place, and time. No cranial nerve deficit.  Skin: Skin is warm and dry. No rash noted. No erythema. No pallor.  Psychiatric: Affect and judgment normal.   Labs Lab on 01/24/2018  Component Date Value Ref Range Status  . WBC 01/24/2018 6.6  4.0 - 10.5 K/uL Final  . RBC 01/24/2018 4.33  3.87 - 5.11 MIL/uL Final  . Hemoglobin 01/24/2018 12.5  12.0 - 15.0 g/dL Final  . HCT 01/24/2018 39.6  36.0 - 46.0 % Final  . MCV 01/24/2018 91.5  78.0 - 100.0 fL Final  . MCH 01/24/2018 28.9  26.0 - 34.0 pg Final  . MCHC 01/24/2018 31.6  30.0 - 36.0 g/dL Final  . RDW 01/24/2018 15.5  11.5 - 15.5 % Final  . Platelets 01/24/2018 285  150 - 400 K/uL Final  . Neutrophils Relative % 01/24/2018 66  % Final  . Neutro Abs 01/24/2018 4.3  1.7 - 7.7 K/uL Final  . Lymphocytes Relative 01/24/2018 26  % Final  . Lymphs Abs 01/24/2018 1.7  0.7 - 4.0 K/uL Final  . Monocytes Relative 01/24/2018 7  % Final  . Monocytes Absolute 01/24/2018 0.5  0.1 - 1.0 K/uL Final  . Eosinophils Relative 01/24/2018 1  % Final  . Eosinophils Absolute 01/24/2018 0.1  0.0 - 0.7 K/uL Final  . Basophils Relative 01/24/2018 0  % Final  . Basophils Absolute 01/24/2018 0.0  0.0 - 0.1 K/uL Final   Performed at [facility], [address] [place], [place] [address]  .  Sodium 01/24/2018 136  135 - 145 mmol/L Final  . Potassium 01/24/2018 4.1  3.5 - 5.1 mmol/L Final  . Chloride 01/24/2018 102  101 - 111 mmol/L Final  . CO2 01/24/2018 25  22 - 32 mmol/L Final  . Glucose, Bld 01/24/2018 134* 65 - 99 mg/dL Final  . BUN 01/24/2018 15  6 - 20 mg/dL Final  . Creatinine, Ser 01/24/2018 1.23* 0.44 - 1.00 mg/dL Final  . Calcium 01/24/2018 9.9  8.9 - 10.3 mg/dL Final  . Total Protein 01/24/2018 7.8  6.5 - 8.1 g/dL Final  . Albumin 01/24/2018 4.0  3.5 - 5.0 g/dL Final  . AST 01/24/2018 18  15 - 41 U/L Final  . ALT 01/24/2018 10* 14 - 54 U/L Final  . Alkaline Phosphatase 01/24/2018 67  38 - 126 U/L Final  . Total Bilirubin 01/24/2018 0.5  0.3 - 1.2 mg/dL Final  . GFR calc non Af Amer 01/24/2018 46* >60 mL/min Final  . GFR calc Af Amer 01/24/2018 54* >60 mL/min Final   Comment: (NOTE) The eGFR has been calculated using the CKD EPI equation. This calculation has not been validated in all clinical situations. eGFR's persistently <60 mL/min signify possible Chronic Kidney Disease.   Georgiann Hahn gap 01/24/2018 9  5 - 15 Final   Performed at [facility], [address], [place], [place] [address]  . LDH 01/24/2018 109  98 - 192 U/L Final   Performed at [facility], [address], [place], [place] [address]     Pathology No orders of the defined types were placed in this encounter.      Zoila Shutter MD

## 2018-02-21 ENCOUNTER — Inpatient Hospital Stay (HOSPITAL_COMMUNITY): Payer: Medicare HMO | Attending: Internal Medicine

## 2018-02-21 ENCOUNTER — Inpatient Hospital Stay (HOSPITAL_COMMUNITY): Payer: Medicare HMO

## 2018-02-21 ENCOUNTER — Inpatient Hospital Stay (HOSPITAL_BASED_OUTPATIENT_CLINIC_OR_DEPARTMENT_OTHER): Payer: Medicare HMO | Admitting: Hematology

## 2018-02-21 ENCOUNTER — Other Ambulatory Visit: Payer: Self-pay

## 2018-02-21 ENCOUNTER — Encounter (HOSPITAL_COMMUNITY): Payer: Self-pay

## 2018-02-21 ENCOUNTER — Encounter (HOSPITAL_COMMUNITY): Payer: Self-pay | Admitting: Hematology

## 2018-02-21 VITALS — BP 115/60 | HR 73 | Temp 98.1°F | Resp 18 | Wt 98.0 lb

## 2018-02-21 DIAGNOSIS — C7951 Secondary malignant neoplasm of bone: Secondary | ICD-10-CM | POA: Insufficient documentation

## 2018-02-21 DIAGNOSIS — E032 Hypothyroidism due to medicaments and other exogenous substances: Secondary | ICD-10-CM | POA: Diagnosis not present

## 2018-02-21 DIAGNOSIS — F17210 Nicotine dependence, cigarettes, uncomplicated: Secondary | ICD-10-CM | POA: Insufficient documentation

## 2018-02-21 DIAGNOSIS — E278 Other specified disorders of adrenal gland: Secondary | ICD-10-CM

## 2018-02-21 DIAGNOSIS — C3410 Malignant neoplasm of upper lobe, unspecified bronchus or lung: Secondary | ICD-10-CM | POA: Diagnosis not present

## 2018-02-21 DIAGNOSIS — I10 Essential (primary) hypertension: Secondary | ICD-10-CM | POA: Diagnosis not present

## 2018-02-21 DIAGNOSIS — N189 Chronic kidney disease, unspecified: Secondary | ICD-10-CM | POA: Diagnosis not present

## 2018-02-21 DIAGNOSIS — E119 Type 2 diabetes mellitus without complications: Secondary | ICD-10-CM

## 2018-02-21 DIAGNOSIS — C3411 Malignant neoplasm of upper lobe, right bronchus or lung: Secondary | ICD-10-CM

## 2018-02-21 DIAGNOSIS — E039 Hypothyroidism, unspecified: Secondary | ICD-10-CM | POA: Insufficient documentation

## 2018-02-21 DIAGNOSIS — C7971 Secondary malignant neoplasm of right adrenal gland: Secondary | ICD-10-CM

## 2018-02-21 DIAGNOSIS — I129 Hypertensive chronic kidney disease with stage 1 through stage 4 chronic kidney disease, or unspecified chronic kidney disease: Secondary | ICD-10-CM | POA: Diagnosis not present

## 2018-02-21 DIAGNOSIS — Z79899 Other long term (current) drug therapy: Secondary | ICD-10-CM | POA: Diagnosis not present

## 2018-02-21 DIAGNOSIS — Z5112 Encounter for antineoplastic immunotherapy: Secondary | ICD-10-CM | POA: Diagnosis not present

## 2018-02-21 DIAGNOSIS — R69 Illness, unspecified: Secondary | ICD-10-CM | POA: Diagnosis not present

## 2018-02-21 DIAGNOSIS — L298 Other pruritus: Secondary | ICD-10-CM | POA: Diagnosis not present

## 2018-02-21 LAB — CBC WITH DIFFERENTIAL/PLATELET
BASOS ABS: 0 10*3/uL (ref 0.0–0.1)
Basophils Relative: 0 %
EOS ABS: 0.2 10*3/uL (ref 0.0–0.7)
EOS PCT: 2 %
HCT: 37.9 % (ref 36.0–46.0)
Hemoglobin: 12 g/dL (ref 12.0–15.0)
Lymphocytes Relative: 20 %
Lymphs Abs: 1.9 10*3/uL (ref 0.7–4.0)
MCH: 28.8 pg (ref 26.0–34.0)
MCHC: 31.7 g/dL (ref 30.0–36.0)
MCV: 91.1 fL (ref 78.0–100.0)
Monocytes Absolute: 0.7 10*3/uL (ref 0.1–1.0)
Monocytes Relative: 7 %
NEUTROS PCT: 71 %
Neutro Abs: 6.8 10*3/uL (ref 1.7–7.7)
PLATELETS: 273 10*3/uL (ref 150–400)
RBC: 4.16 MIL/uL (ref 3.87–5.11)
RDW: 15.5 % (ref 11.5–15.5)
WBC: 9.5 10*3/uL (ref 4.0–10.5)

## 2018-02-21 LAB — LACTATE DEHYDROGENASE: LDH: 111 U/L (ref 98–192)

## 2018-02-21 LAB — COMPREHENSIVE METABOLIC PANEL
ALBUMIN: 3.6 g/dL (ref 3.5–5.0)
ALT: 9 U/L — ABNORMAL LOW (ref 14–54)
AST: 19 U/L (ref 15–41)
Alkaline Phosphatase: 68 U/L (ref 38–126)
Anion gap: 10 (ref 5–15)
BUN: 18 mg/dL (ref 6–20)
CHLORIDE: 103 mmol/L (ref 101–111)
CO2: 25 mmol/L (ref 22–32)
CREATININE: 1.54 mg/dL — AB (ref 0.44–1.00)
Calcium: 9.8 mg/dL (ref 8.9–10.3)
GFR calc non Af Amer: 35 mL/min — ABNORMAL LOW (ref >60)
GFR, EST AFRICAN AMERICAN: 41 mL/min — AB (ref >60)
GLUCOSE: 108 mg/dL — AB (ref 65–99)
Potassium: 4.3 mmol/L (ref 3.5–5.1)
SODIUM: 138 mmol/L (ref 135–145)
Total Bilirubin: 0.4 mg/dL (ref 0.3–1.2)
Total Protein: 7.4 g/dL (ref 6.5–8.1)

## 2018-02-21 MED ORDER — DENOSUMAB 120 MG/1.7ML ~~LOC~~ SOLN
120.0000 mg | Freq: Once | SUBCUTANEOUS | Status: AC
  Administered 2018-02-21: 120 mg via SUBCUTANEOUS
  Filled 2018-02-21: qty 1.7

## 2018-02-21 MED ORDER — SODIUM CHLORIDE 0.9 % IV SOLN
Freq: Once | INTRAVENOUS | Status: AC
  Administered 2018-02-21: 11:00:00 via INTRAVENOUS

## 2018-02-21 MED ORDER — HEPARIN SOD (PORK) LOCK FLUSH 100 UNIT/ML IV SOLN
INTRAVENOUS | Status: AC
  Filled 2018-02-21: qty 5

## 2018-02-21 MED ORDER — SODIUM CHLORIDE 0.9 % IV SOLN
Freq: Once | INTRAVENOUS | Status: AC
  Administered 2018-02-21: 10:00:00 via INTRAVENOUS

## 2018-02-21 MED ORDER — SODIUM CHLORIDE 0.9 % IV SOLN
480.0000 mg | Freq: Once | INTRAVENOUS | Status: AC
  Administered 2018-02-21: 480 mg via INTRAVENOUS
  Filled 2018-02-21: qty 48

## 2018-02-21 MED ORDER — HEPARIN SOD (PORK) LOCK FLUSH 100 UNIT/ML IV SOLN
500.0000 [IU] | Freq: Once | INTRAVENOUS | Status: AC | PRN
  Administered 2018-02-21: 500 [IU]

## 2018-02-21 MED ORDER — SODIUM CHLORIDE 0.9% FLUSH
10.0000 mL | INTRAVENOUS | Status: DC | PRN
  Administered 2018-02-21: 10 mL
  Filled 2018-02-21: qty 10

## 2018-02-21 NOTE — Assessment & Plan Note (Signed)
1.  Metastatic poorly differentiated adenocarcinoma of the lung to the bones and right adrenal: -Foundation 1 testing with 14 genomic alterations, no approved FDA therapies and the patient tumor type - On opdivo 480 mg monthly started on 02/25/2015 -CT scan of the chest, abdomen and pelvis on 01/19/2018 shows stable right apical mass, right adrenal metastases - Tolerating immunotherapy very well except palmar itching for the last 2 weeks, right more than left.  I have recommended hydrocortisone cream twice daily to the palms. -She will proceed with opdivo today.  I have reviewed her labs which are within normal limits.  We will reevaluate her in 4 weeks.  2.  CKD: Her creatinine was elevated at 1.54 today.  This is above her normal baseline which is around 1.2-1.3.  I have recommended 500 mL of normal saline today.  3.  Right shoulder pain and pain in the back of the legs: She is taking oxycodone 10 mg up to 2 tablets a day as needed.  Pain is well controlled.  4.  Bone metastases: She will continue monthly denosumab.  Her calcium was within normal limits.

## 2018-02-21 NOTE — Patient Instructions (Signed)
[facility] Discharge Instructions for Patients Receiving Chemotherapy   Beginning January 23rd 2017 lab work for the [facility] will be done in the  Main lab at [facility] on 1st floor. If you have a lab appointment with the [facility] please come in thru the  Main Entrance and check in at the main information desk   Today you received the following chemotherapy agents Opdivo as well as Xgeva injection. Follow-up as scheduled. Call clinic for any questions or concerns  To help prevent nausea and vomiting after your treatment, we encourage you to take your nausea medication   If you develop nausea and vomiting, or diarrhea that is not controlled by your medication, call the clinic.  The clinic phone number is [phone]. Office hours are Monday-Friday 8:30am-5:00pm.  BELOW ARE SYMPTOMS THAT SHOULD BE REPORTED IMMEDIATELY:  *FEVER GREATER THAN 101.0 F  *CHILLS WITH OR WITHOUT FEVER  NAUSEA AND VOMITING THAT IS NOT CONTROLLED WITH YOUR NAUSEA MEDICATION  *UNUSUAL SHORTNESS OF BREATH  *UNUSUAL BRUISING OR BLEEDING  TENDERNESS IN MOUTH AND THROAT WITH OR WITHOUT PRESENCE OF ULCERS  *URINARY PROBLEMS  *BOWEL PROBLEMS  UNUSUAL RASH Items with * indicate a potential emergency and should be followed up as soon as possible. If you have an emergency after office hours please contact your primary care physician or go to the nearest emergency department.  Please call the clinic during office hours if you have any questions or concerns.   You may also contact the Patient Navigator at [phone] should you have any questions or need assistance in obtaining follow up care.      Resources For Cancer Patients and their Caregivers ? American Cancer Society: Can assist with transportation, wigs, general needs, runs Look Good Feel Better.        [phone] ? Cancer Care: Provides financial assistance, online support groups, medication/co-pay assistance.   1-800-813-HOPE 417 684 5179) ? [employer] Assists [place] cancer patients and their families through emotional , educational and financial support.  [phone] ? [place] DSS Where to apply for food stamps, Medicaid and utility assistance. 415-712-9481 ? [employer]: Transportation to medical appointments. [phone] ? Social Security Administration: May apply for disability if have a Stage IV cancer. [phone] ? [employer], Disability and Transit Services: Assists with nutrition, care and transit needs. [phone]

## 2018-02-21 NOTE — Progress Notes (Signed)
[facility] [address] [place] [address]   CLINIC:  Medical Oncology/Hematology  PCP:  [name], MD [place] [address] [phone]   REASON FOR VISIT:  Follow-up for immunotherapy for lung cancer.  CURRENT THERAPY: Opdivo every 4 weeks.  BRIEF ONCOLOGIC HISTORY:    Cancer of upper lobe of right lung ([facility])   07/28/2014 Imaging    CT chest: Large R apical mass consistent with malignancy. This is destroying the R 2nd rib with extension into adjacent soft tissue. R hilar adenopathy with R 5cm adrenal metastatic lesion.      08/01/2014 Initial Biopsy    Lung, needle/core biopsy(ies), right upper lobe - POORLY DIFFERENTIATED ADENOCARCINOMA, SEE COMMENT.      08/08/2014 PET scan    Large hypermetabolic R apical mass with evidence of direct chest wall and mediastinal invasion, right retrocrural lymphadenopathy, extensive retroperitoneal lymphadenopathy, and metastatic lesions to the adrenal glands       09/02/2014 - 11/04/2014 Chemotherapy    Cisplatin/Pemetrexed/Avastin every 21 days x 4 cycles      10/07/2014 - 10/27/2014 Radiation Therapy    Right lung apex for control of brachioplexopathy.      12/24/2014 - 02/25/2015 Chemotherapy    Alimta/Avastin every 21 days.      02/20/2015 Imaging    Increase in size of right adrenal metastasis and subjacent confluent retrocaval lymphadenopathy      02/25/2015 -  Chemotherapy    Nivolumab, zometa      05/04/2015 Imaging    CT CAP- Stable to slight decrease in the posterior right apical lesion. Stable appearance of posterior right upper rib an upper thoracic bony lesions. Slight improvement in right upper lobe tree-in-bud opacity. No new or progressive findings in...      07/28/2015 Imaging    CT CAP- Reduced size of the right apical pleural parenchymal lesion and reduced size of the right adrenal metastatic lesion. Resolution of prior retrocrural adenopathy.  Right eccentric T1 and T2  sclerosis with sclerosis and tapering of the right second..      11/17/2015 Imaging    CT CAP- Stable soft tissue thickening in the apex of the right hemi thorax. Stable right adrenal metastasis. Nodularity along the trachea and mainstem bronchi, relatively new from 07/28/2015, favoring adherent debris.      11/18/2015 Treatment Plan Change    Zometa HELD for upcoming tooth extraction      11/24/2015 Treatment Plan Change    Zometa on hold at this time in preparation for tooth extraction in March 2017.  Zometa las given on 11/18/2015.      02/03/2016 Imaging    CT CAP- Heterogeneous right apical masslike consolidation and right adrenal metastasis are unchanged      04/06/2016 Treatment Plan Change    Zometa restarted 6 weeks out from tooth extraction (04/06/2016)      05/12/2016 Imaging    CT CAP- NED in the chest, abdomen or pelvis.  Some areas of nodularity associated with the mainstem bronchi in the left upper lobe bronchus, favored to represent adherent inspissated secretions      08/17/2016 Imaging    CT CAP- 1. Stable CTs of the chest and abdomen. No evidence of progressive metastatic disease. 2. Probable treated tumor at the right apex, right adrenal gland and T2 vertebral body, stable. 3. Fluctuating nodularity along the walls of the trachea and mainstem bronchi, likely secretions.      12/12/2016 Imaging  Further decrease in size of treated tumor within the right apex. 2. Stable treated tumor involving the right second rib and T2 vertebra. 3. Stable right adrenal gland treated tumor. 4. Emphysema 5. Aortic atherosclerosis      01/04/2017 Imaging    MRI brain- Normal brain MRI.  No intracranial metastatic disease.      03/15/2017 Imaging    CT CAP- 1. No new or progressive metastatic disease in the chest or abdomen. 2. Stable treated tumor in the apical right upper lobe. Stable treated right posterior second rib and right T2 vertebral lesions. Stable treated right  adrenal metastasis. 3. Aortic atherosclerosis. 4. Moderate emphysema with mild diffuse bronchial wall thickening, suggesting COPD.      06/12/2017 Imaging    CT CAP 1. Similar appearance of treated primary within the right apex. 2. Similar areas of sclerosis within the right second rib, T2, and less so T1 vertebral bodies. These are most consistent with treated metastasis. 3. Similar right adrenal treated metastasis. 4. No evidence of new or progressive disease. 5. Similar right and progressive left areas of bronchial wall thickening and probable mucoid impaction. Correlate with interval infectious symptoms. 6.  Emphysema (ICD10-J43.9). 7. Coronary artery atherosclerosis. Aortic Atherosclerosis (ICD10-I70.0).       10/16/2017 Imaging    CT CAP 1. Stable appearance of the prior Pancoast tumor and related bony findings in the right second rib and right T1 and T2 vertebra compatible with successfully treated tumor. No significant enlargement or new lesions are identified. Similarly the treated right adrenal metastatic lesion is stable in appearance. 2. Upper normal size right hilar lymph node may warrant surveillance. Currently 9 mm in short axis. 3. Other imaging findings of potential clinical significance: Aortic Atherosclerosis (ICD10-I70.0) and Emphysema (ICD10-J43.9). Scattered proximal sigmoid colon diverticula. Mucus plugging medially in the left upper lobe and posteriorly in the right upper lobe.         INTERVAL HISTORY:  Melissa Sullivan 62 y.o. female returns for follow-up of lung cancer and immunotherapy.  She denies any diarrhea, or cough.  She denies any headaches.  She has developed itching in her palms for the last 2 weeks.  Right side is more than left side.  She is using moisturizing lotion.  She is also trying not to soak her hands in the water much.  She denies any recent hospitalizations or infections.  REVIEW OF SYSTEMS:  Review of Systems  Constitutional:  Positive for fatigue.  HENT:  Negative.   Respiratory: Negative.   Cardiovascular: Negative.   Gastrointestinal: Negative.   Genitourinary: Negative.    Musculoskeletal: Negative.   Skin: Positive for itching.  Neurological: Negative.   Hematological: Negative.   Psychiatric/Behavioral: Negative.      PAST MEDICAL/SURGICAL HISTORY:  Past Medical History:  Diagnosis Date  . Adrenal mass, right ([facility]) 07/28/2014  . Anemia   . Bone metastases ([facility]) 04/05/2016  . Cancer ([facility])    lung  right  . Diabetes mellitus without complication ([facility])   . GERD (gastroesophageal reflux disease)   . Hyperlipidemia   . Hypertension   . Hypothyroidism due to medication 01/30/2017  . Lung mass 07/28/2014  . Reflux    Past Surgical History:  Procedure Laterality Date  . AMPUTATION Right 02/22/2017   Procedure: PARTIAL AMPUTATION RIGHT GREAT TOE;  Surgeon: Caprice Beaver, DPM;  Location: [facility];  Service: Podiatry;  Laterality: Right;  . APPENDECTOMY    . ESOPHAGOGASTRODUODENOSCOPY N/A 12/09/2014   JKD:TOIZTI esophageal stricture/mild-to-noderate erosive gastritis. negative H.pylori  .  FLEXIBLE SIGMOIDOSCOPY  2011   Dr. [name]: hyperplastic polyp  . LUNG BIOPSY Right 07/2014   CT guided  . MALONEY DILATION N/A 12/09/2014   Procedure: [name] DILATION;  Surgeon: [name], MD;  Location: [facility];  Service: Endoscopy;  Laterality: N/A;  . PORTACATH PLACEMENT Left 09/01/2014  . SAVORY DILATION N/A 12/09/2014   Procedure: SAVORY DILATION;  Surgeon: [name], MD;  Location: [facility];  Service: Endoscopy;  Laterality: N/A;     SOCIAL HISTORY:  Social History   Socioeconomic History  . Marital status: Married    Spouse name: Not on file  . Number of children: Not on file  . Years of education: Not on file  . Highest education level: Not on file  Occupational History  . Not on file  Social Needs  . Financial resource strain: Not on file  . Food insecurity:    Worry: Not on  file    Inability: Not on file  . Transportation needs:    Medical: Not on file    Non-medical: Not on file  Tobacco Use  . Smoking status: Light Tobacco Smoker    Packs/day: 0.30    Years: 20.00    Pack years: 6.00    Types: Cigarettes  . Smokeless tobacco: Never Used  Substance and Sexual Activity  . Alcohol use: No  . Drug use: No  . Sexual activity: Not on file  Lifestyle  . Physical activity:    Days per week: Not on file    Minutes per session: Not on file  . Stress: Not on file  Relationships  . Social connections:    Talks on phone: Not on file    Gets together: Not on file    Attends religious service: Not on file    Active member of club or organization: Not on file    Attends meetings of clubs or organizations: Not on file    Relationship status: Not on file  . Intimate partner violence:    Fear of current or ex partner: Not on file    Emotionally abused: Not on file    Physically abused: Not on file    Forced sexual activity: Not on file  Other Topics Concern  . Not on file  Social History Narrative  . Not on file    FAMILY HISTORY:  Family History  Problem Relation Age of Onset  . Cancer Sister   . Colon cancer Neg Hx     CURRENT MEDICATIONS:  Outpatient Encounter Medications as of 02/21/2018  Medication Sig  . Calcium Carb-Cholecalciferol (CALCIUM 1000 + D PO) Take 1 tablet by mouth daily.  Marland Kitchen denosumab (XGEVA) 120 MG/1.7ML SOLN injection Inject 120 mg into the skin every 28 (twenty-eight) days.  Marland Kitchen dronabinol (MARINOL) 2.5 MG capsule Take 1 capsule (2.5 mg total) by mouth 2 (two) times daily before lunch and supper.  Marland Kitchen ENSURE (ENSURE) Take 1 Can by mouth 4 (four) times daily.  . Fluticasone-Salmeterol (ADVAIR DISKUS) 500-50 MCG/DOSE AEPB Inhale 1 puff into the lungs 2 (two) times daily. (Patient taking differently: Inhale 1 puff into the lungs 2 (two) times daily as needed (for respiratory issues.). )  . ibuprofen (ADVIL,MOTRIN) 200 MG tablet Take 400  mg by mouth every 8 (eight) hours as needed (for pain.).  Marland Kitchen levothyroxine (SYNTHROID, LEVOTHROID) 25 MCG tablet TAKE 1 TABLET BY MOUTH ONCE DAILY BEFORE BREAKFAST  . lidocaine-prilocaine (EMLA) cream APPLY A QUARTER SIZE AMOUNT TO PORT SITE 1  HOUR PRIOR TO CHEMO.  DO NOT RUB IN.  COVER WITH PLASTIC WRAP  . magic mouthwash SOLN Take 5 mLs by mouth 4 (four) times daily as needed for mouth pain.  . Nivolumab (OPDIVO IV) Inject into the vein every 28 (twenty-eight) days.  Marland Kitchen omeprazole (PRILOSEC) 40 MG capsule Take 1 capsule (40 mg total) by mouth daily.  . ondansetron (ZOFRAN) 8 MG tablet Take 1 tablet (8 mg total) by mouth every 8 (eight) hours as needed for nausea or vomiting.  . Oxycodone HCl 10 MG TABS Take 1-2 tablets (10-20 mg total) by mouth every 6 (six) hours as needed (pain.).  Marland Kitchen simvastatin (ZOCOR) 40 MG tablet TAKE ONE TABLET BY MOUTH ONCE DAILY IN THE MORNING   No facility-administered encounter medications on file as of 02/21/2018.     ALLERGIES:  No Known Allergies   PHYSICAL EXAM:  ECOG Performance status: 1  I have reviewed vital signs. Physical Exam  Skin: Skin is warm. No rash noted.  Psychiatric: Mood, memory, affect and judgment normal.       LABORATORY DATA:  I have reviewed the labs as listed.  CBC    Component Value Date/Time   WBC 9.5 02/21/2018 0835   RBC 4.16 02/21/2018 0835   HGB 12.0 02/21/2018 0835   HCT 37.9 02/21/2018 0835   PLT 273 02/21/2018 0835   MCV 91.1 02/21/2018 0835   MCH 28.8 02/21/2018 0835   MCHC 31.7 02/21/2018 0835   RDW 15.5 02/21/2018 0835   LYMPHSABS 1.9 02/21/2018 0835   MONOABS 0.7 02/21/2018 0835   EOSABS 0.2 02/21/2018 0835   BASOSABS 0.0 02/21/2018 0835   CMP Latest Ref Rng & Units 02/21/2018 01/24/2018 12/27/2017  Glucose 65 - 99 mg/dL 108(H) 134(H) 102(H)  BUN 6 - 20 mg/dL 18 15 16   Creatinine 0.44 - 1.00 mg/dL 1.54(H) 1.23(H) 1.20(H)  Sodium 135 - 145 mmol/L 138 136 138  Potassium 3.5 - 5.1 mmol/L 4.3 4.1 4.1  Chloride  101 - 111 mmol/L 103 102 104  CO2 22 - 32 mmol/L 25 25 25   Calcium 8.9 - 10.3 mg/dL 9.8 9.9 10.1  Total Protein 6.5 - 8.1 g/dL 7.4 7.8 8.2(H)  Total Bilirubin 0.3 - 1.2 mg/dL 0.4 0.5 0.1(L)  Alkaline Phos 38 - 126 U/L 68 67 70  AST 15 - 41 U/L 19 18 18   ALT 14 - 54 U/L 9(L) 10(L) 10(L)       DIAGNOSTIC IMAGING:  I have reviewed CT scan of the chest, abdomen and pelvis dated 01/19/2018 which shows stable right apical mass and right adrenal metastases.     ASSESSMENT & PLAN:   Cancer of upper lobe of right lung 1.  Metastatic poorly differentiated adenocarcinoma of the lung to the bones and right adrenal: -Foundation 1 testing with 14 genomic alterations, no approved FDA therapies and the patient tumor type - On opdivo 480 mg monthly started on 02/25/2015 -CT scan of the chest, abdomen and pelvis on 01/19/2018 shows stable right apical mass, right adrenal metastases - Tolerating immunotherapy very well except palmar itching for the last 2 weeks, right more than left.  I have recommended hydrocortisone cream twice daily to the palms. -She will proceed with opdivo today.  I have reviewed her labs which are within normal limits.  We will reevaluate her in 4 weeks.  2.  CKD: Her creatinine was elevated at 1.54 today.  This is above her normal baseline which is around 1.2-1.3.  I have recommended 500 mL  of normal saline today.  3.  Right shoulder pain and pain in the back of the legs: She is taking oxycodone 10 mg up to 2 tablets a day as needed.  Pain is well controlled.  4.  Bone metastases: She will continue monthly denosumab.  Her calcium was within normal limits.      Orders placed this encounter:  Orders Placed This Encounter  Procedures  . CBC  . Comprehensive metabolic panel  . TSH      Derek Jack, [place] [phone]

## 2018-02-21 NOTE — Progress Notes (Signed)
0950 Labs reviewed with Dr. Delton Coombes and pt approved for Opdivo infusion today as well as hydration with 500 ml NS per MD.                                  Melissa Sullivan tolerated Opdivo infusion, Xgeva injection and hydration well without complaints or incident. Calcium 9.8 today and pt denied any tooth or jaw pain and no recent or future dental visits. VSS upon discharge. Pt discharged self ambulatory in satisfactory condition

## 2018-03-20 DIAGNOSIS — C3490 Malignant neoplasm of unspecified part of unspecified bronchus or lung: Secondary | ICD-10-CM | POA: Diagnosis not present

## 2018-03-20 DIAGNOSIS — B351 Tinea unguium: Secondary | ICD-10-CM | POA: Diagnosis not present

## 2018-03-20 DIAGNOSIS — G629 Polyneuropathy, unspecified: Secondary | ICD-10-CM | POA: Diagnosis not present

## 2018-03-21 ENCOUNTER — Inpatient Hospital Stay (HOSPITAL_BASED_OUTPATIENT_CLINIC_OR_DEPARTMENT_OTHER): Payer: Medicare HMO | Admitting: Hematology

## 2018-03-21 ENCOUNTER — Other Ambulatory Visit: Payer: Self-pay

## 2018-03-21 ENCOUNTER — Inpatient Hospital Stay (HOSPITAL_COMMUNITY): Payer: Medicare HMO | Attending: Hematology

## 2018-03-21 ENCOUNTER — Inpatient Hospital Stay (HOSPITAL_COMMUNITY): Payer: Medicare HMO

## 2018-03-21 ENCOUNTER — Encounter (HOSPITAL_COMMUNITY): Payer: Self-pay | Admitting: Hematology

## 2018-03-21 VITALS — BP 107/62 | HR 91 | Temp 98.7°F | Resp 16 | Wt 95.4 lb

## 2018-03-21 VITALS — BP 104/60 | HR 77 | Temp 98.2°F | Resp 18

## 2018-03-21 DIAGNOSIS — C7951 Secondary malignant neoplasm of bone: Secondary | ICD-10-CM | POA: Diagnosis not present

## 2018-03-21 DIAGNOSIS — F17210 Nicotine dependence, cigarettes, uncomplicated: Secondary | ICD-10-CM | POA: Insufficient documentation

## 2018-03-21 DIAGNOSIS — I10 Essential (primary) hypertension: Secondary | ICD-10-CM | POA: Diagnosis not present

## 2018-03-21 DIAGNOSIS — C7971 Secondary malignant neoplasm of right adrenal gland: Secondary | ICD-10-CM | POA: Diagnosis not present

## 2018-03-21 DIAGNOSIS — E032 Hypothyroidism due to medicaments and other exogenous substances: Secondary | ICD-10-CM

## 2018-03-21 DIAGNOSIS — Z5112 Encounter for antineoplastic immunotherapy: Secondary | ICD-10-CM | POA: Diagnosis not present

## 2018-03-21 DIAGNOSIS — I2510 Atherosclerotic heart disease of native coronary artery without angina pectoris: Secondary | ICD-10-CM | POA: Diagnosis not present

## 2018-03-21 DIAGNOSIS — C3411 Malignant neoplasm of upper lobe, right bronchus or lung: Secondary | ICD-10-CM

## 2018-03-21 DIAGNOSIS — E119 Type 2 diabetes mellitus without complications: Secondary | ICD-10-CM | POA: Insufficient documentation

## 2018-03-21 DIAGNOSIS — Z79899 Other long term (current) drug therapy: Secondary | ICD-10-CM | POA: Diagnosis not present

## 2018-03-21 DIAGNOSIS — I129 Hypertensive chronic kidney disease with stage 1 through stage 4 chronic kidney disease, or unspecified chronic kidney disease: Secondary | ICD-10-CM | POA: Diagnosis not present

## 2018-03-21 DIAGNOSIS — N189 Chronic kidney disease, unspecified: Secondary | ICD-10-CM

## 2018-03-21 DIAGNOSIS — E278 Other specified disorders of adrenal gland: Secondary | ICD-10-CM

## 2018-03-21 DIAGNOSIS — R69 Illness, unspecified: Secondary | ICD-10-CM | POA: Diagnosis not present

## 2018-03-21 LAB — CBC
HCT: 40.7 % (ref 36.0–46.0)
Hemoglobin: 12.8 g/dL (ref 12.0–15.0)
MCH: 28.6 pg (ref 26.0–34.0)
MCHC: 31.4 g/dL (ref 30.0–36.0)
MCV: 90.8 fL (ref 78.0–100.0)
PLATELETS: 279 10*3/uL (ref 150–400)
RBC: 4.48 MIL/uL (ref 3.87–5.11)
RDW: 15.9 % — ABNORMAL HIGH (ref 11.5–15.5)
WBC: 11.2 10*3/uL — AB (ref 4.0–10.5)

## 2018-03-21 LAB — DIFFERENTIAL
BASOS ABS: 0 10*3/uL (ref 0.0–0.1)
BASOS PCT: 0 %
Eosinophils Absolute: 0.2 10*3/uL (ref 0.0–0.7)
Eosinophils Relative: 2 %
Lymphocytes Relative: 23 %
Lymphs Abs: 2.6 10*3/uL (ref 0.7–4.0)
MONO ABS: 0.8 10*3/uL (ref 0.1–1.0)
MONOS PCT: 8 %
Neutro Abs: 7.3 10*3/uL (ref 1.7–7.7)
Neutrophils Relative %: 67 %

## 2018-03-21 LAB — COMPREHENSIVE METABOLIC PANEL
ALBUMIN: 4 g/dL (ref 3.5–5.0)
ALT: 10 U/L — ABNORMAL LOW (ref 14–54)
ANION GAP: 7 (ref 5–15)
AST: 15 U/L (ref 15–41)
Alkaline Phosphatase: 76 U/L (ref 38–126)
BUN: 14 mg/dL (ref 6–20)
CO2: 26 mmol/L (ref 22–32)
Calcium: 9.9 mg/dL (ref 8.9–10.3)
Chloride: 104 mmol/L (ref 101–111)
Creatinine, Ser: 1.32 mg/dL — ABNORMAL HIGH (ref 0.44–1.00)
GFR calc Af Amer: 49 mL/min — ABNORMAL LOW (ref >60)
GFR calc non Af Amer: 43 mL/min — ABNORMAL LOW (ref >60)
GLUCOSE: 90 mg/dL (ref 65–99)
Potassium: 4.2 mmol/L (ref 3.5–5.1)
Sodium: 137 mmol/L (ref 135–145)
TOTAL PROTEIN: 8.1 g/dL (ref 6.5–8.1)
Total Bilirubin: 0.6 mg/dL (ref 0.3–1.2)

## 2018-03-21 LAB — TSH: TSH: 2.384 u[IU]/mL (ref 0.350–4.500)

## 2018-03-21 MED ORDER — SODIUM CHLORIDE 0.9 % IV SOLN
480.0000 mg | Freq: Once | INTRAVENOUS | Status: AC
  Administered 2018-03-21: 480 mg via INTRAVENOUS
  Filled 2018-03-21: qty 48

## 2018-03-21 MED ORDER — SODIUM CHLORIDE 0.9 % IV SOLN
Freq: Once | INTRAVENOUS | Status: AC
  Administered 2018-03-21: 500 mL via INTRAVENOUS

## 2018-03-21 MED ORDER — HEPARIN SOD (PORK) LOCK FLUSH 100 UNIT/ML IV SOLN
500.0000 [IU] | Freq: Once | INTRAVENOUS | Status: AC | PRN
  Administered 2018-03-21: 500 [IU]

## 2018-03-21 MED ORDER — DENOSUMAB 120 MG/1.7ML ~~LOC~~ SOLN
120.0000 mg | Freq: Once | SUBCUTANEOUS | Status: AC
  Administered 2018-03-21: 120 mg via SUBCUTANEOUS
  Filled 2018-03-21: qty 1.7

## 2018-03-21 MED ORDER — SODIUM CHLORIDE 0.9% FLUSH
10.0000 mL | INTRAVENOUS | Status: DC | PRN
  Administered 2018-03-21: 10 mL
  Filled 2018-03-21: qty 10

## 2018-03-21 NOTE — Progress Notes (Signed)
Patient to treatment area for oncology follow up visit and treatment.  No complaints voiced.  Concerned about weight but no other issues.  Patient tolerated chemotherapy with no complaints voiced.  Port site clean and dry with no bruising or swelling noted at site.  No complaints of pain with flush.  Band aid applied.  VSS with discharge and left ambulatory with family with no s/s of distress noted.    Patient received xgeva shot today.  Denied tooth, jaw, or leg pain.  No recent dental visits or upcoming visits.  Taking calcium as directed.  Tolerated shot with no complaints voiced.  Band aid applied.

## 2018-03-21 NOTE — Assessment & Plan Note (Signed)
1.  Metastatic poorly differentiated adenocarcinoma of the lung to the bones and right adrenal: -Foundation 1 testing with 14 genomic alterations, no approved FDA therapies and the patient tumor type - On opdivo 480 mg monthly started on 02/25/2015 -CT scan of the chest, abdomen and pelvis on 01/19/2018 shows stable right apical mass, right adrenal metastases - Tolerating Opdivo very well.  She had decrease in appetite as she was stressed lately.  She had 2.5 pound weight loss.  She is drinking 2 cans of boost and 2 cans of Ensure every day.  Her itching in the palm has improved.  No other itching was reported.  No other immunotherapy related side effects. -She will proceed with opdivo today.  I have reviewed her labs which are within normal limits.  We will reevaluate her in 4 weeks.  I plan to repeat scans after next visit.  2.  CKD: Creatinine is 1.32 today.  Her normal baseline which is around 1.2-1.3.  We have recommended drinking plenty of fluids.  3.  Right shoulder pain and pain in the back of the legs: She is taking oxycodone 10 mg up to 2 tablets a day as needed.  Pain is well controlled.  4.  Bone metastases: She will continue monthly denosumab.  Her calcium was within normal limits.

## 2018-03-21 NOTE — Progress Notes (Signed)
[facility] [address] [place] [address]   CLINIC:  Medical Oncology/Hematology  PCP:  [name], MD [place] [address] [phone]   REASON FOR VISIT:  Follow-up for immunotherapy for lung cancer.  CURRENT THERAPY: Opdivo every 4 weeks.   BRIEF ONCOLOGIC HISTORY:    Cancer of upper lobe of right lung ([facility])   07/28/2014 Imaging    CT chest: Large R apical mass consistent with malignancy. This is destroying the R 2nd rib with extension into adjacent soft tissue. R hilar adenopathy with R 5cm adrenal metastatic lesion.      08/01/2014 Initial Biopsy    Lung, needle/core biopsy(ies), right upper lobe - POORLY DIFFERENTIATED ADENOCARCINOMA, SEE COMMENT.      08/08/2014 PET scan    Large hypermetabolic R apical mass with evidence of direct chest wall and mediastinal invasion, right retrocrural lymphadenopathy, extensive retroperitoneal lymphadenopathy, and metastatic lesions to the adrenal glands       09/02/2014 - 11/04/2014 Chemotherapy    Cisplatin/Pemetrexed/Avastin every 21 days x 4 cycles      10/07/2014 - 10/27/2014 Radiation Therapy    Right lung apex for control of brachioplexopathy.      12/24/2014 - 02/25/2015 Chemotherapy    Alimta/Avastin every 21 days.      02/20/2015 Imaging    Increase in size of right adrenal metastasis and subjacent confluent retrocaval lymphadenopathy      02/25/2015 -  Chemotherapy    Nivolumab, zometa      05/04/2015 Imaging    CT CAP- Stable to slight decrease in the posterior right apical lesion. Stable appearance of posterior right upper rib an upper thoracic bony lesions. Slight improvement in right upper lobe tree-in-bud opacity. No new or progressive findings in...      07/28/2015 Imaging    CT CAP- Reduced size of the right apical pleural parenchymal lesion and reduced size of the right adrenal metastatic lesion. Resolution of prior retrocrural adenopathy.  Right eccentric T1 and T2  sclerosis with sclerosis and tapering of the right second..      11/17/2015 Imaging    CT CAP- Stable soft tissue thickening in the apex of the right hemi thorax. Stable right adrenal metastasis. Nodularity along the trachea and mainstem bronchi, relatively new from 07/28/2015, favoring adherent debris.      11/18/2015 Treatment Plan Change    Zometa HELD for upcoming tooth extraction      11/24/2015 Treatment Plan Change    Zometa on hold at this time in preparation for tooth extraction in March 2017.  Zometa las given on 11/18/2015.      02/03/2016 Imaging    CT CAP- Heterogeneous right apical masslike consolidation and right adrenal metastasis are unchanged      04/06/2016 Treatment Plan Change    Zometa restarted 6 weeks out from tooth extraction (04/06/2016)      05/12/2016 Imaging    CT CAP- NED in the chest, abdomen or pelvis.  Some areas of nodularity associated with the mainstem bronchi in the left upper lobe bronchus, favored to represent adherent inspissated secretions      08/17/2016 Imaging    CT CAP- 1. Stable CTs of the chest and abdomen. No evidence of progressive metastatic disease. 2. Probable treated tumor at the right apex, right adrenal gland and T2 vertebral body, stable. 3. Fluctuating nodularity along the walls of the trachea and mainstem bronchi, likely secretions.      12/12/2016 Imaging  Further decrease in size of treated tumor within the right apex. 2. Stable treated tumor involving the right second rib and T2 vertebra. 3. Stable right adrenal gland treated tumor. 4. Emphysema 5. Aortic atherosclerosis      01/04/2017 Imaging    MRI brain- Normal brain MRI.  No intracranial metastatic disease.      03/15/2017 Imaging    CT CAP- 1. No new or progressive metastatic disease in the chest or abdomen. 2. Stable treated tumor in the apical right upper lobe. Stable treated right posterior second rib and right T2 vertebral lesions. Stable treated right  adrenal metastasis. 3. Aortic atherosclerosis. 4. Moderate emphysema with mild diffuse bronchial wall thickening, suggesting COPD.      06/12/2017 Imaging    CT CAP 1. Similar appearance of treated primary within the right apex. 2. Similar areas of sclerosis within the right second rib, T2, and less so T1 vertebral bodies. These are most consistent with treated metastasis. 3. Similar right adrenal treated metastasis. 4. No evidence of new or progressive disease. 5. Similar right and progressive left areas of bronchial wall thickening and probable mucoid impaction. Correlate with interval infectious symptoms. 6.  Emphysema (ICD10-J43.9). 7. Coronary artery atherosclerosis. Aortic Atherosclerosis (ICD10-I70.0).       10/16/2017 Imaging    CT CAP 1. Stable appearance of the prior Pancoast tumor and related bony findings in the right second rib and right T1 and T2 vertebra compatible with successfully treated tumor. No significant enlargement or new lesions are identified. Similarly the treated right adrenal metastatic lesion is stable in appearance. 2. Upper normal size right hilar lymph node may warrant surveillance. Currently 9 mm in short axis. 3. Other imaging findings of potential clinical significance: Aortic Atherosclerosis (ICD10-I70.0) and Emphysema (ICD10-J43.9). Scattered proximal sigmoid colon diverticula. Mucus plugging medially in the left upper lobe and posteriorly in the right upper lobe.         INTERVAL HISTORY:  [name] 62 y.o. female returns for follow-up for lung cancer and consideration for next cycle of Opdivo.  Here for next cycle of Opdivo.   Denies rash, N&V, or diarrhea.  Recently saw podiatrist because she was having foot pain; they recommended she wear certain shoes to help with this.  Her appetite is a little lower, "but I think it's because I've been stressed lately."  She has lost about 2.5 lbs; she is trying to drink more Boost/Ensure.   She feels like her appetite is improving.  She says she is trying to drink plenty of fluids.  Denies worsening cough. She has chronic productive cough, which is stable and largely unchanged.  Denies hemoptysis.   Will consider restaging scans in June.    REVIEW OF SYSTEMS:  Review of Systems  Constitutional: Positive for appetite change and fatigue.  All other systems reviewed and are negative.    PAST MEDICAL/SURGICAL HISTORY:  Past Medical History:  Diagnosis Date  . Adrenal mass, right ([facility]) 07/28/2014  . Anemia   . Bone metastases ([facility]) 04/05/2016  . Cancer ([facility])    lung  right  . Diabetes mellitus without complication ([facility])   . GERD (gastroesophageal reflux disease)   . Hyperlipidemia   . Hypertension   . Hypothyroidism due to medication 01/30/2017  . Lung mass 07/28/2014  . Reflux    Past Surgical History:  Procedure Laterality Date  . AMPUTATION Right 02/22/2017   Procedure: PARTIAL AMPUTATION RIGHT GREAT TOE;  Surgeon: Caprice Beaver, DPM;  Location: [facility];  Service: Podiatry;  Laterality: Right;  . APPENDECTOMY    . ESOPHAGOGASTRODUODENOSCOPY N/A 12/09/2014   IRJ:JOACZY esophageal stricture/mild-to-noderate erosive gastritis. negative H.pylori  . FLEXIBLE SIGMOIDOSCOPY  2011   Dr. [name]: hyperplastic polyp  . LUNG BIOPSY Right 07/2014   CT guided  . MALONEY DILATION N/A 12/09/2014   Procedure: [name] DILATION;  Surgeon: [name], MD;  Location: [facility];  Service: Endoscopy;  Laterality: N/A;  . PORTACATH PLACEMENT Left 09/01/2014  . SAVORY DILATION N/A 12/09/2014   Procedure: SAVORY DILATION;  Surgeon: [name], MD;  Location: [facility];  Service: Endoscopy;  Laterality: N/A;     SOCIAL HISTORY:  Social History   Socioeconomic History  . Marital status: Married    Spouse name: Not on file  . Number of children: Not on file  . Years of education: Not on file  . Highest education level: Not on file  Occupational History  . Not on file    Social Needs  . Financial resource strain: Not on file  . Food insecurity:    Worry: Not on file    Inability: Not on file  . Transportation needs:    Medical: Not on file    Non-medical: Not on file  Tobacco Use  . Smoking status: Light Tobacco Smoker    Packs/day: 0.30    Years: 20.00    Pack years: 6.00    Types: Cigarettes  . Smokeless tobacco: Never Used  Substance and Sexual Activity  . Alcohol use: No  . Drug use: No  . Sexual activity: Not on file  Lifestyle  . Physical activity:    Days per week: Not on file    Minutes per session: Not on file  . Stress: Not on file  Relationships  . Social connections:    Talks on phone: Not on file    Gets together: Not on file    Attends religious service: Not on file    Active member of club or organization: Not on file    Attends meetings of clubs or organizations: Not on file    Relationship status: Not on file  . Intimate partner violence:    Fear of current or ex partner: Not on file    Emotionally abused: Not on file    Physically abused: Not on file    Forced sexual activity: Not on file  Other Topics Concern  . Not on file  Social History Narrative  . Not on file    FAMILY HISTORY:  Family History  Problem Relation Age of Onset  . Cancer Sister   . Colon cancer Neg Hx     CURRENT MEDICATIONS:  Outpatient Encounter Medications as of 03/21/2018  Medication Sig  . Calcium Carb-Cholecalciferol (CALCIUM 1000 + D PO) Take 1 tablet by mouth daily.  Marland Kitchen denosumab (XGEVA) 120 MG/1.7ML SOLN injection Inject 120 mg into the skin every 28 (twenty-eight) days.  Marland Kitchen dronabinol (MARINOL) 2.5 MG capsule Take 1 capsule (2.5 mg total) by mouth 2 (two) times daily before lunch and supper.  Marland Kitchen ENSURE (ENSURE) Take 1 Can by mouth 4 (four) times daily.  . Fluticasone-Salmeterol (ADVAIR DISKUS) 500-50 MCG/DOSE AEPB Inhale 1 puff into the lungs 2 (two) times daily. (Patient taking differently: Inhale 1 puff into the lungs 2 (two) times  daily as needed (for respiratory issues.). )  . ibuprofen (ADVIL,MOTRIN) 200 MG tablet Take 400 mg by mouth every 8 (eight) hours as needed (for pain.).  Marland Kitchen levothyroxine (SYNTHROID, LEVOTHROID) 25 MCG  tablet TAKE 1 TABLET BY MOUTH ONCE DAILY BEFORE BREAKFAST  . lidocaine-prilocaine (EMLA) cream APPLY A QUARTER SIZE AMOUNT TO PORT SITE 1 HOUR PRIOR TO CHEMO.  DO NOT RUB IN.  COVER WITH PLASTIC WRAP  . magic mouthwash SOLN Take 5 mLs by mouth 4 (four) times daily as needed for mouth pain.  . Nivolumab (OPDIVO IV) Inject into the vein every 28 (twenty-eight) days.  Marland Kitchen omeprazole (PRILOSEC) 40 MG capsule Take 1 capsule (40 mg total) by mouth daily.  . ondansetron (ZOFRAN) 8 MG tablet Take 1 tablet (8 mg total) by mouth every 8 (eight) hours as needed for nausea or vomiting.  . Oxycodone HCl 10 MG TABS Take 1-2 tablets (10-20 mg total) by mouth every 6 (six) hours as needed (pain.).  Marland Kitchen simvastatin (ZOCOR) 40 MG tablet TAKE ONE TABLET BY MOUTH ONCE DAILY IN THE MORNING   No facility-administered encounter medications on file as of 03/21/2018.     ALLERGIES:  No Known Allergies   PHYSICAL EXAM:  ECOG Performance status: 1  I have reviewed vital signs. Physical Exam   No skin rashes.  Awake alert oriented x3.  No focal deficits.  LABORATORY DATA:  I have reviewed the labs as listed.  CBC    Component Value Date/Time   WBC 11.2 (H) 03/21/2018 0934   RBC 4.48 03/21/2018 0934   HGB 12.8 03/21/2018 0934   HCT 40.7 03/21/2018 0934   PLT 279 03/21/2018 0934   MCV 90.8 03/21/2018 0934   MCH 28.6 03/21/2018 0934   MCHC 31.4 03/21/2018 0934   RDW 15.9 (H) 03/21/2018 0934   LYMPHSABS 2.6 03/21/2018 0935   MONOABS 0.8 03/21/2018 0935   EOSABS 0.2 03/21/2018 0935   BASOSABS 0.0 03/21/2018 0935   CMP Latest Ref Rng & Units 03/21/2018 02/21/2018 01/24/2018  Glucose 65 - 99 mg/dL 90 108(H) 134(H)  BUN 6 - 20 mg/dL 14 18 15   Creatinine 0.44 - 1.00 mg/dL 1.32(H) 1.54(H) 1.23(H)  Sodium 135 - 145  mmol/L 137 138 136  Potassium 3.5 - 5.1 mmol/L 4.2 4.3 4.1  Chloride 101 - 111 mmol/L 104 103 102  CO2 22 - 32 mmol/L 26 25 25   Calcium 8.9 - 10.3 mg/dL 9.9 9.8 9.9  Total Protein 6.5 - 8.1 g/dL 8.1 7.4 7.8  Total Bilirubin 0.3 - 1.2 mg/dL 0.6 0.4 0.5  Alkaline Phos 38 - 126 U/L 76 68 67  AST 15 - 41 U/L 15 19 18   ALT 14 - 54 U/L 10(L) 9(L) 10(L)       DIAGNOSTIC IMAGING:  I have reviewed CT scan of the chest, abdomen and pelvis dated 01/19/2018 which shows stable right apical mass and right adrenal metastases.     ASSESSMENT & PLAN:   Cancer of upper lobe of right lung 1.  Metastatic poorly differentiated adenocarcinoma of the lung to the bones and right adrenal: -Foundation 1 testing with 14 genomic alterations, no approved FDA therapies and the patient tumor type - On opdivo 480 mg monthly started on 02/25/2015 -CT scan of the chest, abdomen and pelvis on 01/19/2018 shows stable right apical mass, right adrenal metastases - Tolerating Opdivo very well.  She had decrease in appetite as she was stressed lately.  She had 2.5 pound weight loss.  She is drinking 2 cans of boost and 2 cans of Ensure every day.  Her itching in the palm has improved.  No other itching was reported.  No other immunotherapy related side effects. -She will proceed  with opdivo today.  I have reviewed her labs which are within normal limits.  We will reevaluate her in 4 weeks.  I plan to repeat scans after next visit.  2.  CKD: Creatinine is 1.32 today.  Her normal baseline which is around 1.2-1.3.  We have recommended drinking plenty of fluids.  3.  Right shoulder pain and pain in the back of the legs: She is taking oxycodone 10 mg up to 2 tablets a day as needed.  Pain is well controlled.  4.  Bone metastases: She will continue monthly denosumab.  Her calcium was within normal limits.      This note includes documentation from Mike Craze, NP, who was present during this patient's office visit and  evaluation.  I have reviewed this note for its completeness and accuracy.  I have edited this note accordingly based on my findings and medical opinion.      Derek Jack, MD [facility] [phone]

## 2018-03-21 NOTE — Patient Instructions (Signed)
[facility] Discharge Instructions for Patients Receiving Chemotherapy  Today you received the following chemotherapy agents opdivo and xgeva today.    If you develop nausea and vomiting that is not controlled by your nausea medication, call the clinic.   BELOW ARE SYMPTOMS THAT SHOULD BE REPORTED IMMEDIATELY:  *FEVER GREATER THAN 100.5 F  *CHILLS WITH OR WITHOUT FEVER  NAUSEA AND VOMITING THAT IS NOT CONTROLLED WITH YOUR NAUSEA MEDICATION  *UNUSUAL SHORTNESS OF BREATH  *UNUSUAL BRUISING OR BLEEDING  TENDERNESS IN MOUTH AND THROAT WITH OR WITHOUT PRESENCE OF ULCERS  *URINARY PROBLEMS  *BOWEL PROBLEMS  UNUSUAL RASH Items with * indicate a potential emergency and should be followed up as soon as possible.  Feel free to call the clinic should you have any questions or concerns. The clinic phone number is [phone].  Please show the [facility] at check-in to the Emergency Department and triage nurse.

## 2018-04-06 ENCOUNTER — Telehealth (HOSPITAL_COMMUNITY): Payer: Self-pay

## 2018-04-06 NOTE — Telephone Encounter (Signed)
Nutrition  Patient identified on Malnutrition Screening report for poor appetite and weight loss.  Called patient and no answer and voice mail box has not been set up.  Please refer to RD if can be of assistance to patient.  Nyjae Hodge B. Zenia Resides, [place], [place] Registered Dietitian [phone] (pager)

## 2018-04-18 ENCOUNTER — Inpatient Hospital Stay (HOSPITAL_COMMUNITY): Payer: Medicare HMO

## 2018-04-18 ENCOUNTER — Inpatient Hospital Stay (HOSPITAL_BASED_OUTPATIENT_CLINIC_OR_DEPARTMENT_OTHER): Payer: Medicare HMO | Admitting: Hematology

## 2018-04-18 ENCOUNTER — Encounter (HOSPITAL_COMMUNITY): Payer: Self-pay | Admitting: Hematology

## 2018-04-18 VITALS — BP 108/52 | HR 75 | Temp 98.0°F | Resp 16

## 2018-04-18 DIAGNOSIS — Z5112 Encounter for antineoplastic immunotherapy: Secondary | ICD-10-CM | POA: Diagnosis not present

## 2018-04-18 DIAGNOSIS — C7951 Secondary malignant neoplasm of bone: Secondary | ICD-10-CM | POA: Diagnosis not present

## 2018-04-18 DIAGNOSIS — C3411 Malignant neoplasm of upper lobe, right bronchus or lung: Secondary | ICD-10-CM

## 2018-04-18 DIAGNOSIS — C7971 Secondary malignant neoplasm of right adrenal gland: Secondary | ICD-10-CM | POA: Diagnosis not present

## 2018-04-18 DIAGNOSIS — N189 Chronic kidney disease, unspecified: Secondary | ICD-10-CM

## 2018-04-18 DIAGNOSIS — E278 Other specified disorders of adrenal gland: Secondary | ICD-10-CM

## 2018-04-18 DIAGNOSIS — M25511 Pain in right shoulder: Secondary | ICD-10-CM

## 2018-04-18 DIAGNOSIS — M549 Dorsalgia, unspecified: Secondary | ICD-10-CM

## 2018-04-18 DIAGNOSIS — R630 Anorexia: Secondary | ICD-10-CM

## 2018-04-18 DIAGNOSIS — R634 Abnormal weight loss: Secondary | ICD-10-CM

## 2018-04-18 LAB — CBC WITH DIFFERENTIAL/PLATELET
BASOS PCT: 0 %
Basophils Absolute: 0 10*3/uL (ref 0.0–0.1)
EOS PCT: 1 %
Eosinophils Absolute: 0.1 10*3/uL (ref 0.0–0.7)
HEMATOCRIT: 37.3 % (ref 36.0–46.0)
Hemoglobin: 11.9 g/dL — ABNORMAL LOW (ref 12.0–15.0)
LYMPHS PCT: 22 %
Lymphs Abs: 1.8 10*3/uL (ref 0.7–4.0)
MCH: 28.9 pg (ref 26.0–34.0)
MCHC: 31.9 g/dL (ref 30.0–36.0)
MCV: 90.5 fL (ref 78.0–100.0)
MONO ABS: 0.7 10*3/uL (ref 0.1–1.0)
MONOS PCT: 8 %
Neutro Abs: 5.9 10*3/uL (ref 1.7–7.7)
Neutrophils Relative %: 69 %
PLATELETS: 249 10*3/uL (ref 150–400)
RBC: 4.12 MIL/uL (ref 3.87–5.11)
RDW: 15.9 % — AB (ref 11.5–15.5)
WBC: 8.5 10*3/uL (ref 4.0–10.5)

## 2018-04-18 LAB — COMPREHENSIVE METABOLIC PANEL
ALT: 9 U/L — ABNORMAL LOW (ref 14–54)
ANION GAP: 7 (ref 5–15)
AST: 20 U/L (ref 15–41)
Albumin: 3.6 g/dL (ref 3.5–5.0)
Alkaline Phosphatase: 60 U/L (ref 38–126)
BILIRUBIN TOTAL: 0.4 mg/dL (ref 0.3–1.2)
BUN: 15 mg/dL (ref 6–20)
CHLORIDE: 104 mmol/L (ref 101–111)
CO2: 25 mmol/L (ref 22–32)
Calcium: 9.5 mg/dL (ref 8.9–10.3)
Creatinine, Ser: 1.16 mg/dL — ABNORMAL HIGH (ref 0.44–1.00)
GFR, EST AFRICAN AMERICAN: 58 mL/min — AB (ref >60)
GFR, EST NON AFRICAN AMERICAN: 50 mL/min — AB (ref >60)
Glucose, Bld: 142 mg/dL — ABNORMAL HIGH (ref 65–99)
Potassium: 3.8 mmol/L (ref 3.5–5.1)
Sodium: 136 mmol/L (ref 135–145)
TOTAL PROTEIN: 7.5 g/dL (ref 6.5–8.1)

## 2018-04-18 LAB — LACTATE DEHYDROGENASE: LDH: 108 U/L (ref 98–192)

## 2018-04-18 MED ORDER — SODIUM CHLORIDE 0.9% FLUSH
10.0000 mL | INTRAVENOUS | Status: DC | PRN
  Administered 2018-04-18: 10 mL
  Filled 2018-04-18: qty 10

## 2018-04-18 MED ORDER — SODIUM CHLORIDE 0.9 % IV SOLN
480.0000 mg | Freq: Once | INTRAVENOUS | Status: AC
Start: 2018-04-18 — End: 2018-04-18
  Administered 2018-04-18: 480 mg via INTRAVENOUS
  Filled 2018-04-18: qty 48

## 2018-04-18 MED ORDER — DENOSUMAB 120 MG/1.7ML ~~LOC~~ SOLN
120.0000 mg | Freq: Once | SUBCUTANEOUS | Status: AC
  Administered 2018-04-18: 120 mg via SUBCUTANEOUS
  Filled 2018-04-18: qty 1.7

## 2018-04-18 MED ORDER — DRONABINOL 2.5 MG PO CAPS: 2.5000 mg | ORAL_CAPSULE | Freq: Two times a day (BID) | ORAL | 2 refills | Status: DC

## 2018-04-18 MED ORDER — HEPARIN SOD (PORK) LOCK FLUSH 100 UNIT/ML IV SOLN
500.0000 [IU] | Freq: Once | INTRAVENOUS | Status: AC | PRN
  Administered 2018-04-18: 500 [IU]

## 2018-04-18 MED ORDER — SODIUM CHLORIDE 0.9 % IV SOLN
Freq: Once | INTRAVENOUS | Status: AC
  Administered 2018-04-18: 10:00:00 via INTRAVENOUS

## 2018-04-18 NOTE — Progress Notes (Signed)
[place], [place] [address]   CLINIC:  Medical Oncology/Hematology  PCP:  [name], MD [place] [address] [phone]   REASON FOR VISIT:  Follow-up for metastatic lung cancer.  CURRENT THERAPY: Opdivo every 4 weeks.  BRIEF ONCOLOGIC HISTORY:    Cancer of upper lobe of right lung ([facility])   07/28/2014 Imaging    CT chest: Large R apical mass consistent with malignancy. This is destroying the R 2nd rib with extension into adjacent soft tissue. R hilar adenopathy with R 5cm adrenal metastatic lesion.      08/01/2014 Initial Biopsy    Lung, needle/core biopsy(ies), right upper lobe - POORLY DIFFERENTIATED ADENOCARCINOMA, SEE COMMENT.      08/08/2014 PET scan    Large hypermetabolic R apical mass with evidence of direct chest wall and mediastinal invasion, right retrocrural lymphadenopathy, extensive retroperitoneal lymphadenopathy, and metastatic lesions to the adrenal glands       09/02/2014 - 11/04/2014 Chemotherapy    Cisplatin/Pemetrexed/Avastin every 21 days x 4 cycles      10/07/2014 - 10/27/2014 Radiation Therapy    Right lung apex for control of brachioplexopathy.      12/24/2014 - 02/25/2015 Chemotherapy    Alimta/Avastin every 21 days.      02/20/2015 Imaging    Increase in size of right adrenal metastasis and subjacent confluent retrocaval lymphadenopathy      02/25/2015 -  Chemotherapy    Nivolumab, zometa      05/04/2015 Imaging    CT CAP- Stable to slight decrease in the posterior right apical lesion. Stable appearance of posterior right upper rib an upper thoracic bony lesions. Slight improvement in right upper lobe tree-in-bud opacity. No new or progressive findings in...      07/28/2015 Imaging    CT CAP- Reduced size of the right apical pleural parenchymal lesion and reduced size of the right adrenal metastatic lesion. Resolution of prior retrocrural adenopathy.  Right eccentric T1 and T2 sclerosis  with sclerosis and tapering of the right second..      11/17/2015 Imaging    CT CAP- Stable soft tissue thickening in the apex of the right hemi thorax. Stable right adrenal metastasis. Nodularity along the trachea and mainstem bronchi, relatively new from 07/28/2015, favoring adherent debris.      11/18/2015 Treatment Plan Change    Zometa HELD for upcoming tooth extraction      11/24/2015 Treatment Plan Change    Zometa on hold at this time in preparation for tooth extraction in March 2017.  Zometa las given on 11/18/2015.      02/03/2016 Imaging    CT CAP- Heterogeneous right apical masslike consolidation and right adrenal metastasis are unchanged      04/06/2016 Treatment Plan Change    Zometa restarted 6 weeks out from tooth extraction (04/06/2016)      05/12/2016 Imaging    CT CAP- NED in the chest, abdomen or pelvis.  Some areas of nodularity associated with the mainstem bronchi in the left upper lobe bronchus, favored to represent adherent inspissated secretions      08/17/2016 Imaging    CT CAP- 1. Stable CTs of the chest and abdomen. No evidence of progressive metastatic disease. 2. Probable treated tumor at the right apex, right adrenal gland and T2 vertebral body, stable. 3. Fluctuating nodularity along the walls of the trachea and mainstem bronchi, likely secretions.      12/12/2016 Imaging  Further decrease in size of treated tumor within the right apex. 2. Stable treated tumor involving the right second rib and T2 vertebra. 3. Stable right adrenal gland treated tumor. 4. Emphysema 5. Aortic atherosclerosis      01/04/2017 Imaging    MRI brain- Normal brain MRI.  No intracranial metastatic disease.      03/15/2017 Imaging    CT CAP- 1. No new or progressive metastatic disease in the chest or abdomen. 2. Stable treated tumor in the apical right upper lobe. Stable treated right posterior second rib and right T2 vertebral lesions. Stable treated right adrenal  metastasis. 3. Aortic atherosclerosis. 4. Moderate emphysema with mild diffuse bronchial wall thickening, suggesting COPD.      06/12/2017 Imaging    CT CAP 1. Similar appearance of treated primary within the right apex. 2. Similar areas of sclerosis within the right second rib, T2, and less so T1 vertebral bodies. These are most consistent with treated metastasis. 3. Similar right adrenal treated metastasis. 4. No evidence of new or progressive disease. 5. Similar right and progressive left areas of bronchial wall thickening and probable mucoid impaction. Correlate with interval infectious symptoms. 6.  Emphysema (ICD10-J43.9). 7. Coronary artery atherosclerosis. Aortic Atherosclerosis (ICD10-I70.0).       10/16/2017 Imaging    CT CAP 1. Stable appearance of the prior Pancoast tumor and related bony findings in the right second rib and right T1 and T2 vertebra compatible with successfully treated tumor. No significant enlargement or new lesions are identified. Similarly the treated right adrenal metastatic lesion is stable in appearance. 2. Upper normal size right hilar lymph node may warrant surveillance. Currently 9 mm in short axis. 3. Other imaging findings of potential clinical significance: Aortic Atherosclerosis (ICD10-I70.0) and Emphysema (ICD10-J43.9). Scattered proximal sigmoid colon diverticula. Mucus plugging medially in the left upper lobe and posteriorly in the right upper lobe.         CANCER STAGING: Cancer Staging No matching staging information was found for the patient.   INTERVAL HISTORY:  Ms. Rueckert 62 y.o. female returns for routine follow-up and consideration for next cycle of chemotherapy.  She is tolerating Opdivo very well.  Denies any nausea, vomiting, diarrhea.  Denies any shortness of breath or acute worsening of cough.  Denies any skin rashes.  Since we started her on Marinol a month ago, her appetite has picked up.  She gained about  close to 2 pounds.  Her pain in the right shoulder and leg pain has been stable.  She takes oxycodone 10 mg up to 2 tablets/day.  She has a new pain medial to the right shoulder about 4 days ago.  She woke up with the pain.  This has slightly improved.  Denies any headaches or vision changes.  Energy levels and appetite are reported at 75%.     REVIEW OF SYSTEMS:  Review of Systems  Constitutional: Positive for fatigue.  Musculoskeletal:       Right shoulder pain and bilateral leg pain stable.  All other systems reviewed and are negative.    PAST MEDICAL/SURGICAL HISTORY:  Past Medical History:  Diagnosis Date  . Adrenal mass, right ([facility]) 07/28/2014  . Anemia   . Bone metastases ([facility]) 04/05/2016  . Cancer ([facility])    lung  right  . Diabetes mellitus without complication ([employer])   . GERD (gastroesophageal reflux disease)   . Hyperlipidemia   . Hypertension   . Hypothyroidism due to medication 01/30/2017  . Lung mass 07/28/2014  .  Reflux    Past Surgical History:  Procedure Laterality Date  . AMPUTATION Right 02/22/2017   Procedure: PARTIAL AMPUTATION RIGHT GREAT TOE;  Surgeon: Caprice Beaver, DPM;  Location: [facility] ORS;  Service: Podiatry;  Laterality: Right;  . APPENDECTOMY    . ESOPHAGOGASTRODUODENOSCOPY N/A 12/09/2014   WYO:VZCHYI esophageal stricture/mild-to-noderate erosive gastritis. negative H.pylori  . FLEXIBLE SIGMOIDOSCOPY  2011   Dr. [name]: hyperplastic polyp  . LUNG BIOPSY Right 07/2014   CT guided  . MALONEY DILATION N/A 12/09/2014   Procedure: [name] DILATION;  Surgeon: [name], MD;  Location: [facility] END[facility] SUITE;  Service: Endoscopy;  Laterality: N/A;  . PORTACATH PLACEMENT Left 09/01/2014  . SAVORY DILATION N/A 12/09/2014   Procedure: SAVORY DILATION;  Surgeon: [name], MD;  Location: [facility] SUITE;  Service: Endoscopy;  Laterality: N/A;     SOCIAL HISTORY:  Social History   Socioeconomic History  . Marital status: Married    Spouse name: Not on file  .  Number of children: Not on file  . Years of education: Not on file  . Highest education level: Not on file  Occupational History  . Not on file  Social Needs  . Financial resource strain: Not on file  . Food insecurity:    Worry: Not on file    Inability: Not on file  . Transportation needs:    Medical: Not on file    Non-medical: Not on file  Tobacco Use  . Smoking status: Light Tobacco Smoker    Packs/day: 0.30    Years: 20.00    Pack years: 6.00    Types: Cigarettes  . Smokeless tobacco: Never Used  Substance and Sexual Activity  . Alcohol use: No  . Drug use: No  . Sexual activity: Not on file  Lifestyle  . Physical activity:    Days per week: Not on file    Minutes per session: Not on file  . Stress: Not on file  Relationships  . Social connections:    Talks on phone: Not on file    Gets together: Not on file    Attends religious service: Not on file    Active member of club or organization: Not on file    Attends meetings of clubs or organizations: Not on file    Relationship status: Not on file  . Intimate partner violence:    Fear of current or ex partner: Not on file    Emotionally abused: Not on file    Physically abused: Not on file    Forced sexual activity: Not on file  Other Topics Concern  . Not on file  Social History Narrative  . Not on file    FAMILY HISTORY:  Family History  Problem Relation Age of Onset  . Cancer Sister   . Colon cancer Neg Hx     CURRENT MEDICATIONS:  Outpatient Encounter Medications as of 04/18/2018  Medication Sig  . Calcium Carb-Cholecalciferol (CALCIUM 1000 + D PO) Take 1 tablet by mouth daily.  Marland Kitchen denosumab (XGEVA) 120 MG/1.7ML SOLN injection Inject 120 mg into the skin every 28 (twenty-eight) days.  Marland Kitchen dronabinol (MARINOL) 2.5 MG capsule Take 1 capsule (2.5 mg total) by mouth 2 (two) times daily before lunch and supper.  Marland Kitchen ENSURE (ENSURE) Take 1 Can by mouth 4 (four) times daily.  . Fluticasone-Salmeterol (ADVAIR  DISKUS) 500-50 MCG/DOSE AEPB Inhale 1 puff into the lungs 2 (two) times daily. (Patient taking differently: Inhale 1 puff into the lungs 2 (  two) times daily as needed (for respiratory issues.). )  . ibuprofen (ADVIL,MOTRIN) 200 MG tablet Take 400 mg by mouth every 8 (eight) hours as needed (for pain.).  Marland Kitchen levothyroxine (SYNTHROID, LEVOTHROID) 25 MCG tablet TAKE 1 TABLET BY MOUTH ONCE DAILY BEFORE BREAKFAST  . lidocaine-prilocaine (EMLA) cream APPLY A QUARTER SIZE AMOUNT TO PORT SITE 1 HOUR PRIOR TO CHEMO.  DO NOT RUB IN.  COVER WITH PLASTIC WRAP  . magic mouthwash SOLN Take 5 mLs by mouth 4 (four) times daily as needed for mouth pain.  . Nivolumab (OPDIVO IV) Inject into the vein every 28 (twenty-eight) days.  Marland Kitchen omeprazole (PRILOSEC) 40 MG capsule Take 1 capsule (40 mg total) by mouth daily.  . ondansetron (ZOFRAN) 8 MG tablet Take 1 tablet (8 mg total) by mouth every 8 (eight) hours as needed for nausea or vomiting.  . Oxycodone HCl 10 MG TABS Take 1-2 tablets (10-20 mg total) by mouth every 6 (six) hours as needed (pain.).  Marland Kitchen simvastatin (ZOCOR) 40 MG tablet TAKE ONE TABLET BY MOUTH ONCE DAILY IN THE MORNING  . [DISCONTINUED] dronabinol (MARINOL) 2.5 MG capsule Take 1 capsule (2.5 mg total) by mouth 2 (two) times daily before lunch and supper.   Facility-Administered Encounter Medications as of 04/18/2018  Medication  . [COMPLETED] denosumab (XGEVA) injection 120 mg    ALLERGIES:  No Known Allergies   PHYSICAL EXAM:  ECOG Performance status: 1  I have reviewed her vitals today.  Blood pressure is 124/78.  Pulse is 84.  Respiratory rate is 18.  Temperature is 97.7. Physical Exam Chest is bilaterally clear.  HEENT shows no thrush.  Cardiovascular S1-S2 regular rate and rhythm.  Skin has no rashes.  LABORATORY DATA:  I have reviewed the labs as listed.  CBC    Component Value Date/Time   WBC 8.5 04/18/2018 0832   RBC 4.12 04/18/2018 0832   HGB 11.9 (L) 04/18/2018 0832   HCT 37.3  04/18/2018 0832   PLT 249 04/18/2018 0832   MCV 90.5 04/18/2018 0832   MCH 28.9 04/18/2018 0832   MCHC 31.9 04/18/2018 0832   RDW 15.9 (H) 04/18/2018 0832   LYMPHSABS 1.8 04/18/2018 0832   MONOABS 0.7 04/18/2018 0832   EOSABS 0.1 04/18/2018 0832   BASOSABS 0.0 04/18/2018 0832   CMP Latest Ref Rng & Units 04/18/2018 03/21/2018 02/21/2018  Glucose 65 - 99 mg/dL 142(H) 90 108(H)  BUN 6 - 20 mg/dL 15 14 18   Creatinine 0.44 - 1.00 mg/dL 1.16(H) 1.32(H) 1.54(H)  Sodium 135 - 145 mmol/L 136 137 138  Potassium 3.5 - 5.1 mmol/L 3.8 4.2 4.3  Chloride 101 - 111 mmol/L 104 104 103  CO2 22 - 32 mmol/L 25 26 25   Calcium 8.9 - 10.3 mg/dL 9.5 9.9 9.8  Total Protein 6.5 - 8.1 g/dL 7.5 8.1 7.4  Total Bilirubin 0.3 - 1.2 mg/dL 0.4 0.6 0.4  Alkaline Phos 38 - 126 U/L 60 76 68  AST 15 - 41 U/L 20 15 19   ALT 14 - 54 U/L 9(L) 10(L) 9(L)       DIAGNOSTIC IMAGING:    I have reviewed her CT scan from 01/19/2018 independently.    ASSESSMENT & PLAN:   Cancer of upper lobe of right lung 1.  Metastatic poorly differentiated adenocarcinoma of the lung to the bones and right adrenal: -Foundation 1 testing with 14 genomic alterations, no approved FDA therapies and the patient tumor type - On opdivo 480 mg monthly started on 02/25/2015 -CT  scan of the chest, abdomen and pelvis on 01/19/2018 shows stable right apical mass, right adrenal metastases - Does not report any immunotherapy related side effects.  She will proceed with her next cycle of opdivo today.  I have reviewed her labs which are within normal limits.  She will continue opdivo once a month.  I plan to repeat CT scans prior to her next visit in 3 months.  2.  CKD: Creatinine is 1.16 today.  Her normal baseline which is around 1.2-1.3.  We have recommended drinking plenty of fluids.  3.  Right shoulder pain and pain in the back of the legs: She is taking oxycodone 10 mg up to 2 tablets a day as needed.  Pain is well controlled.  4.  Bone metastases:  She will continue monthly denosumab.  Her calcium was within normal limits.  5.  Weight loss: She was started on Marinol 2.5 mg twice a day at last visit 1 month ago.  She gained about 1.7 pounds.  Her appetite also improved.  We have given a refill for it today.      Orders placed this encounter:  Orders Placed This Encounter  Procedures  . CT Chest W Contrast  . CT Abdomen Pelvis W Contrast  . Comprehensive metabolic panel  . CBC with Differential  . TSH  . Comprehensive metabolic panel  . CBC with Differential      Derek Jack, MD [place] [phone]

## 2018-04-18 NOTE — Patient Instructions (Signed)
[facility] Discharge Instructions for Patients Receiving Chemotherapy   Beginning January 23rd 2017 lab work for the Cancer Center will be done in the  Main lab at [facility] on 1st floor. If you have a lab appointment with the Cancer Center please come in thru the  Main Entrance and check in at the main information desk   Today you received the following chemotherapy agents   To help prevent nausea and vomiting after your treatment, we encourage you to take your nausea medication     If you develop nausea and vomiting, or diarrhea that is not controlled by your medication, call the clinic.  The clinic phone number is [phone]. Office hours are Monday-Friday 8:30am-5:00pm.  BELOW ARE SYMPTOMS THAT SHOULD BE REPORTED IMMEDIATELY:  *FEVER GREATER THAN 101.0 F  *CHILLS WITH OR WITHOUT FEVER  NAUSEA AND VOMITING THAT IS NOT CONTROLLED WITH YOUR NAUSEA MEDICATION  *UNUSUAL SHORTNESS OF BREATH  *UNUSUAL BRUISING OR BLEEDING  TENDERNESS IN MOUTH AND THROAT WITH OR WITHOUT PRESENCE OF ULCERS  *URINARY PROBLEMS  *BOWEL PROBLEMS  UNUSUAL RASH Items with * indicate a potential emergency and should be followed up as soon as possible. If you have an emergency after office hours please contact your primary care physician or go to the nearest emergency department.  Please call the clinic during office hours if you have any questions or concerns.   You may also contact the Patient Navigator at [phone] should you have any questions or need assistance in obtaining follow up care.      Resources For Cancer Patients and their Caregivers ? American Cancer Society: Can assist with transportation, wigs, general needs, runs Look Good Feel Better.        1-888-227-6333 ? Cancer Care: Provides financial assistance, online support groups, medication/co-pay assistance.  1-800-813-HOPE (4673) ? [facility] Assists [place] cancer  patients and their families through emotional , educational and financial support.  336-427-4357 ? [place] DSS Where to apply for food stamps, Medicaid and utility assistance. 336-342-1394 ? RCATS: Transportation to medical appointments. 336-347-2287 ? Social Security Administration: May apply for disability if have a Stage IV cancer. 336-342-7796 1-800-772-1213 ? [place] Aging, Disability and Transit Services: Assists with nutrition, care and transit needs. 336-349-2343         

## 2018-04-18 NOTE — Assessment & Plan Note (Signed)
1.  Metastatic poorly differentiated adenocarcinoma of the lung to the bones and right adrenal: -Foundation 1 testing with 14 genomic alterations, no approved FDA therapies and the patient tumor type - On opdivo 480 mg monthly started on 02/25/2015 -CT scan of the chest, abdomen and pelvis on 01/19/2018 shows stable right apical mass, right adrenal metastases - Does not report any immunotherapy related side effects.  She will proceed with her next cycle of opdivo today.  I have reviewed her labs which are within normal limits.  She will continue opdivo once a month.  I plan to repeat CT scans prior to her next visit in 3 months.  2.  CKD: Creatinine is 1.16 today.  Her normal baseline which is around 1.2-1.3.  We have recommended drinking plenty of fluids.  3.  Right shoulder pain and pain in the back of the legs: She is taking oxycodone 10 mg up to 2 tablets a day as needed.  Pain is well controlled.  4.  Bone metastases: She will continue monthly denosumab.  Her calcium was within normal limits.  5.  Weight loss: She was started on Marinol 2.5 mg twice a day at last visit 1 month ago.  She gained about 1.7 pounds.  Her appetite also improved.  We have given a refill for it today.

## 2018-04-18 NOTE — Progress Notes (Signed)
Labs reviewed with Dr. Delton Coombes, patient seen by Dr. Delton Coombes today. Proceed with treatment.   Melissa Sullivan presents today for injection per MD orders. xgeva given per orders, see MAR Administration without incident. Patient tolerated well.   Treatment given per orders. Patient tolerated it well without problems. Vitals stable and discharged home from clinic ambulatory. Follow up as scheduled.

## 2018-04-25 ENCOUNTER — Encounter: Payer: Self-pay | Admitting: Gastroenterology

## 2018-05-09 ENCOUNTER — Other Ambulatory Visit (HOSPITAL_COMMUNITY): Payer: Self-pay | Admitting: Hematology

## 2018-05-09 DIAGNOSIS — C3411 Malignant neoplasm of upper lobe, right bronchus or lung: Secondary | ICD-10-CM

## 2018-05-09 MED ORDER — OXYCODONE HCL 10 MG PO TABS: 10.0000 mg | ORAL_TABLET | Freq: Four times a day (QID) | ORAL | 0 refills | Status: DC | PRN

## 2018-05-10 ENCOUNTER — Other Ambulatory Visit (HOSPITAL_COMMUNITY): Payer: Self-pay | Admitting: Oncology

## 2018-05-10 DIAGNOSIS — E032 Hypothyroidism due to medicaments and other exogenous substances: Secondary | ICD-10-CM

## 2018-05-16 ENCOUNTER — Inpatient Hospital Stay (HOSPITAL_COMMUNITY): Payer: Medicare HMO | Attending: Hematology

## 2018-05-16 ENCOUNTER — Inpatient Hospital Stay (HOSPITAL_COMMUNITY): Payer: Medicare HMO

## 2018-05-16 DIAGNOSIS — C7971 Secondary malignant neoplasm of right adrenal gland: Secondary | ICD-10-CM | POA: Insufficient documentation

## 2018-05-16 DIAGNOSIS — Z5112 Encounter for antineoplastic immunotherapy: Secondary | ICD-10-CM | POA: Diagnosis present

## 2018-05-16 DIAGNOSIS — C3411 Malignant neoplasm of upper lobe, right bronchus or lung: Secondary | ICD-10-CM

## 2018-05-16 DIAGNOSIS — C7951 Secondary malignant neoplasm of bone: Secondary | ICD-10-CM | POA: Insufficient documentation

## 2018-05-16 LAB — COMPREHENSIVE METABOLIC PANEL
ALBUMIN: 3.7 g/dL (ref 3.5–5.0)
ALT: 14 U/L (ref 0–44)
AST: 18 U/L (ref 15–41)
Alkaline Phosphatase: 79 U/L (ref 38–126)
Anion gap: 8 (ref 5–15)
BUN: 18 mg/dL (ref 8–23)
CO2: 25 mmol/L (ref 22–32)
Calcium: 9.5 mg/dL (ref 8.9–10.3)
Chloride: 106 mmol/L (ref 98–111)
Creatinine, Ser: 1.36 mg/dL — ABNORMAL HIGH (ref 0.44–1.00)
GFR calc Af Amer: 48 mL/min — ABNORMAL LOW (ref >60)
GFR calc non Af Amer: 41 mL/min — ABNORMAL LOW (ref >60)
GLUCOSE: 90 mg/dL (ref 70–99)
POTASSIUM: 4.3 mmol/L (ref 3.5–5.1)
Sodium: 139 mmol/L (ref 135–145)
Total Bilirubin: 0.2 mg/dL — ABNORMAL LOW (ref 0.3–1.2)
Total Protein: 7.9 g/dL (ref 6.5–8.1)

## 2018-05-16 LAB — CBC WITH DIFFERENTIAL/PLATELET
BASOS ABS: 0 10*3/uL (ref 0.0–0.1)
Basophils Relative: 0 %
EOS ABS: 0.2 10*3/uL (ref 0.0–0.7)
EOS PCT: 3 %
HCT: 37.9 % (ref 36.0–46.0)
Hemoglobin: 12.1 g/dL (ref 12.0–15.0)
LYMPHS ABS: 3 10*3/uL (ref 0.7–4.0)
LYMPHS PCT: 31 %
MCH: 29.2 pg (ref 26.0–34.0)
MCHC: 31.9 g/dL (ref 30.0–36.0)
MCV: 91.3 fL (ref 78.0–100.0)
MONO ABS: 0.8 10*3/uL (ref 0.1–1.0)
Monocytes Relative: 9 %
Neutro Abs: 5.4 10*3/uL (ref 1.7–7.7)
Neutrophils Relative %: 57 %
PLATELETS: 264 10*3/uL (ref 150–400)
RBC: 4.15 MIL/uL (ref 3.87–5.11)
RDW: 15.9 % — AB (ref 11.5–15.5)
WBC: 9.5 10*3/uL (ref 4.0–10.5)

## 2018-05-18 ENCOUNTER — Encounter (HOSPITAL_COMMUNITY): Payer: Self-pay

## 2018-05-18 ENCOUNTER — Inpatient Hospital Stay (HOSPITAL_COMMUNITY): Payer: Medicare HMO

## 2018-05-18 VITALS — BP 115/65 | HR 76 | Temp 98.6°F | Resp 18 | Wt 96.6 lb

## 2018-05-18 DIAGNOSIS — C3411 Malignant neoplasm of upper lobe, right bronchus or lung: Secondary | ICD-10-CM

## 2018-05-18 DIAGNOSIS — Z5112 Encounter for antineoplastic immunotherapy: Secondary | ICD-10-CM | POA: Diagnosis not present

## 2018-05-18 DIAGNOSIS — C7951 Secondary malignant neoplasm of bone: Secondary | ICD-10-CM

## 2018-05-18 DIAGNOSIS — E278 Other specified disorders of adrenal gland: Secondary | ICD-10-CM

## 2018-05-18 MED ORDER — SODIUM CHLORIDE 0.9 % IV SOLN
Freq: Once | INTRAVENOUS | Status: AC
  Administered 2018-05-18: 10:00:00 via INTRAVENOUS

## 2018-05-18 MED ORDER — SODIUM CHLORIDE 0.9% FLUSH
10.0000 mL | INTRAVENOUS | Status: DC | PRN
  Administered 2018-05-18: 10 mL
  Filled 2018-05-18: qty 10

## 2018-05-18 MED ORDER — NIVOLUMAB CHEMO INJECTION 100 MG/10ML
480.0000 mg | Freq: Once | INTRAVENOUS | Status: AC
  Administered 2018-05-18: 480 mg via INTRAVENOUS
  Filled 2018-05-18: qty 48

## 2018-05-18 MED ORDER — HEPARIN SOD (PORK) LOCK FLUSH 100 UNIT/ML IV SOLN
500.0000 [IU] | Freq: Once | INTRAVENOUS | Status: AC | PRN
  Administered 2018-05-18: 500 [IU]

## 2018-05-18 MED ORDER — DENOSUMAB 120 MG/1.7ML ~~LOC~~ SOLN
120.0000 mg | Freq: Once | SUBCUTANEOUS | Status: AC
  Administered 2018-05-18: 120 mg via SUBCUTANEOUS
  Filled 2018-05-18: qty 1.7

## 2018-05-18 NOTE — Patient Instructions (Signed)
[facility] Discharge Instructions for Patients Receiving Chemotherapy   Beginning January 23rd 2017 lab work for the [facility] will be done in the  Main lab at [facility] on 1st floor. If you have a lab appointment with the [facility] please come in thru the  Main Entrance and check in at the main information desk   Today you received the following chemotherapy agents Opdivo as well as Xgeva injection. Follow-up as scheduled. Call clinic for any questions or concerns  To help prevent nausea and vomiting after your treatment, we encourage you to take your nausea medication    If you develop nausea and vomiting, or diarrhea that is not controlled by your medication, call the clinic.  The clinic phone number is [phone]. Office hours are Monday-Friday 8:30am-5:00pm.  BELOW ARE SYMPTOMS THAT SHOULD BE REPORTED IMMEDIATELY:  *FEVER GREATER THAN 101.0 F  *CHILLS WITH OR WITHOUT FEVER  NAUSEA AND VOMITING THAT IS NOT CONTROLLED WITH YOUR NAUSEA MEDICATION  *UNUSUAL SHORTNESS OF BREATH  *UNUSUAL BRUISING OR BLEEDING  TENDERNESS IN MOUTH AND THROAT WITH OR WITHOUT PRESENCE OF ULCERS  *URINARY PROBLEMS  *BOWEL PROBLEMS  UNUSUAL RASH Items with * indicate a potential emergency and should be followed up as soon as possible. If you have an emergency after office hours please contact your primary care physician or go to the nearest emergency department.  Please call the clinic during office hours if you have any questions or concerns.   You may also contact the Patient Navigator at [phone] should you have any questions or need assistance in obtaining follow up care.      Resources For Cancer Patients and their Caregivers ? American Cancer Society: Can assist with transportation, wigs, general needs, runs Look Good Feel Better.        (361) 214-4162 ? Cancer Care: Provides financial assistance, online support groups, medication/co-pay  assistance.  1-800-813-HOPE (440)291-6014) ? [facility] Assists [place] cancer patients and their families through emotional , educational and financial support.  413-101-3218 ? [place] DSS Where to apply for food stamps, Medicaid and utility assistance. 208-084-3749 ? RCATS: Transportation to medical appointments. 709-576-8555 ? Social Security Administration: May apply for disability if have a Stage IV cancer. 647-873-5800 217-140-4613 ? [employer], Disability and Transit Services: Assists with nutrition, care and transit needs. [phone]

## 2018-05-18 NOTE — Progress Notes (Signed)
Melissa Sullivan tolerated Opdivo infusion and Xgeva injection well without complaints or incident. Labs reviewed with Dr. Delton Coombes prior to administering these medications. Calcium 9.5 today and pt denied any tooth or jaw pain and no recent or future dental visits. VSS upon discharge. Pt discharged self ambulatory in satisfactory condition accompanied by family member

## 2018-05-29 DIAGNOSIS — G629 Polyneuropathy, unspecified: Secondary | ICD-10-CM | POA: Diagnosis not present

## 2018-05-29 DIAGNOSIS — C3490 Malignant neoplasm of unspecified part of unspecified bronchus or lung: Secondary | ICD-10-CM | POA: Diagnosis not present

## 2018-05-29 DIAGNOSIS — B351 Tinea unguium: Secondary | ICD-10-CM | POA: Diagnosis not present

## 2018-06-07 ENCOUNTER — Encounter (HOSPITAL_COMMUNITY): Payer: Self-pay

## 2018-06-07 ENCOUNTER — Ambulatory Visit (HOSPITAL_COMMUNITY)
Admission: RE | Admit: 2018-06-07 | Discharge: 2018-06-07 | Disposition: A | Discharge status: Home / Self Care | Payer: Medicare HMO | Source: Ambulatory Visit | Attending: Hematology | Admitting: Hematology

## 2018-06-07 DIAGNOSIS — C3411 Malignant neoplasm of upper lobe, right bronchus or lung: Secondary | ICD-10-CM | POA: Insufficient documentation

## 2018-06-07 DIAGNOSIS — C7971 Secondary malignant neoplasm of right adrenal gland: Secondary | ICD-10-CM | POA: Insufficient documentation

## 2018-06-07 DIAGNOSIS — M898X8 Other specified disorders of bone, other site: Secondary | ICD-10-CM | POA: Diagnosis not present

## 2018-06-07 MED ORDER — IOHEXOL 300 MG/ML  SOLN
75.0000 mL | Freq: Once | INTRAMUSCULAR | Status: AC | PRN
  Administered 2018-06-07: 75 mL via INTRAVENOUS

## 2018-06-13 ENCOUNTER — Other Ambulatory Visit (HOSPITAL_COMMUNITY): Payer: 59

## 2018-06-13 ENCOUNTER — Ambulatory Visit (HOSPITAL_COMMUNITY): Payer: 59 | Admitting: Hematology

## 2018-06-13 ENCOUNTER — Inpatient Hospital Stay (HOSPITAL_COMMUNITY): Payer: 59

## 2018-06-20 ENCOUNTER — Inpatient Hospital Stay (HOSPITAL_BASED_OUTPATIENT_CLINIC_OR_DEPARTMENT_OTHER): Payer: Medicare HMO | Admitting: Hematology

## 2018-06-20 ENCOUNTER — Inpatient Hospital Stay (HOSPITAL_COMMUNITY): Payer: Medicare HMO

## 2018-06-20 ENCOUNTER — Inpatient Hospital Stay (HOSPITAL_COMMUNITY): Payer: Medicare HMO | Attending: Hematology

## 2018-06-20 ENCOUNTER — Other Ambulatory Visit: Payer: Self-pay

## 2018-06-20 ENCOUNTER — Encounter (HOSPITAL_COMMUNITY): Payer: Self-pay | Admitting: Hematology

## 2018-06-20 VITALS — BP 129/70 | HR 74 | Temp 98.4°F | Resp 16 | Wt 93.5 lb

## 2018-06-20 VITALS — BP 105/83 | HR 72 | Temp 97.8°F | Resp 18

## 2018-06-20 DIAGNOSIS — C3411 Malignant neoplasm of upper lobe, right bronchus or lung: Secondary | ICD-10-CM

## 2018-06-20 DIAGNOSIS — E119 Type 2 diabetes mellitus without complications: Secondary | ICD-10-CM | POA: Diagnosis not present

## 2018-06-20 DIAGNOSIS — I2510 Atherosclerotic heart disease of native coronary artery without angina pectoris: Secondary | ICD-10-CM | POA: Insufficient documentation

## 2018-06-20 DIAGNOSIS — N182 Chronic kidney disease, stage 2 (mild): Secondary | ICD-10-CM | POA: Insufficient documentation

## 2018-06-20 DIAGNOSIS — F17210 Nicotine dependence, cigarettes, uncomplicated: Secondary | ICD-10-CM

## 2018-06-20 DIAGNOSIS — C7971 Secondary malignant neoplasm of right adrenal gland: Secondary | ICD-10-CM

## 2018-06-20 DIAGNOSIS — Z79899 Other long term (current) drug therapy: Secondary | ICD-10-CM | POA: Insufficient documentation

## 2018-06-20 DIAGNOSIS — C7951 Secondary malignant neoplasm of bone: Secondary | ICD-10-CM | POA: Insufficient documentation

## 2018-06-20 DIAGNOSIS — E278 Other specified disorders of adrenal gland: Secondary | ICD-10-CM

## 2018-06-20 DIAGNOSIS — Z5112 Encounter for antineoplastic immunotherapy: Secondary | ICD-10-CM | POA: Insufficient documentation

## 2018-06-20 DIAGNOSIS — I129 Hypertensive chronic kidney disease with stage 1 through stage 4 chronic kidney disease, or unspecified chronic kidney disease: Secondary | ICD-10-CM | POA: Diagnosis not present

## 2018-06-20 DIAGNOSIS — R69 Illness, unspecified: Secondary | ICD-10-CM | POA: Diagnosis not present

## 2018-06-20 LAB — COMPREHENSIVE METABOLIC PANEL
ALT: 12 U/L (ref 0–44)
ANION GAP: 9 (ref 5–15)
AST: 17 U/L (ref 15–41)
Albumin: 4 g/dL (ref 3.5–5.0)
Alkaline Phosphatase: 74 U/L (ref 38–126)
BUN: 13 mg/dL (ref 8–23)
CHLORIDE: 102 mmol/L (ref 98–111)
CO2: 27 mmol/L (ref 22–32)
Calcium: 10.1 mg/dL (ref 8.9–10.3)
Creatinine, Ser: 1.51 mg/dL — ABNORMAL HIGH (ref 0.44–1.00)
GFR calc non Af Amer: 36 mL/min — ABNORMAL LOW (ref >60)
GFR, EST AFRICAN AMERICAN: 42 mL/min — AB (ref >60)
Glucose, Bld: 100 mg/dL — ABNORMAL HIGH (ref 70–99)
POTASSIUM: 4.2 mmol/L (ref 3.5–5.1)
SODIUM: 138 mmol/L (ref 135–145)
Total Bilirubin: 0.3 mg/dL (ref 0.3–1.2)
Total Protein: 8.3 g/dL — ABNORMAL HIGH (ref 6.5–8.1)

## 2018-06-20 LAB — CBC WITH DIFFERENTIAL/PLATELET
Basophils Absolute: 0 10*3/uL (ref 0.0–0.1)
Basophils Relative: 0 %
Eosinophils Absolute: 0.2 10*3/uL (ref 0.0–0.7)
Eosinophils Relative: 2 %
HEMATOCRIT: 39.6 % (ref 36.0–46.0)
HEMOGLOBIN: 12.9 g/dL (ref 12.0–15.0)
LYMPHS PCT: 25 %
Lymphs Abs: 2.5 10*3/uL (ref 0.7–4.0)
MCH: 29.7 pg (ref 26.0–34.0)
MCHC: 32.6 g/dL (ref 30.0–36.0)
MCV: 91 fL (ref 78.0–100.0)
Monocytes Absolute: 0.8 10*3/uL (ref 0.1–1.0)
Monocytes Relative: 7 %
NEUTROS ABS: 6.7 10*3/uL (ref 1.7–7.7)
NEUTROS PCT: 66 %
Platelets: 282 10*3/uL (ref 150–400)
RBC: 4.35 MIL/uL (ref 3.87–5.11)
RDW: 15.9 % — ABNORMAL HIGH (ref 11.5–15.5)
WBC: 10.1 10*3/uL (ref 4.0–10.5)

## 2018-06-20 LAB — TSH: TSH: 1.288 u[IU]/mL (ref 0.350–4.500)

## 2018-06-20 MED ORDER — SODIUM CHLORIDE 0.9 % IV SOLN
480.0000 mg | Freq: Once | INTRAVENOUS | Status: AC
  Administered 2018-06-20: 480 mg via INTRAVENOUS
  Filled 2018-06-20: qty 48

## 2018-06-20 MED ORDER — SODIUM CHLORIDE 0.9 % IV SOLN
Freq: Once | INTRAVENOUS | Status: AC
  Administered 2018-06-20: 12:00:00 via INTRAVENOUS

## 2018-06-20 MED ORDER — HEPARIN SOD (PORK) LOCK FLUSH 100 UNIT/ML IV SOLN
500.0000 [IU] | Freq: Once | INTRAVENOUS | Status: AC | PRN
  Administered 2018-06-20: 500 [IU]
  Filled 2018-06-20: qty 5

## 2018-06-20 NOTE — Assessment & Plan Note (Signed)
1.  Metastatic poorly differentiated adenocarcinoma of the lung to the bones and right adrenal: -Foundation 1 testing with 14 genomic alterations, no approved FDA therapies and the patient tumor type - On opdivo 480 mg monthly started on 02/25/2015 -I reviewed the results of the CT scan of the chest, abdomen and pelvis dated 06/07/2018 which showed stable right upper lobe lung mass and right adrenal meta stasis. - She is tolerating Opdivo very well.  She will proceed with next cycle.  She not have any immunotherapy related side effects.  We will see her back in 3 months with repeat scans.  2.  CKD: Creatinine is 1.5 today.  Her normal baseline which is around 1.2-1.3.  We have recommended drinking plenty of fluids.  3.  Right shoulder pain and pain in the back of the legs: She is taking oxycodone 10 mg 1 to 2 tablets/day.  She mostly takes 1 tablet in the evenings.  4.  Bone metastases: She will need to have dental work done.  I will hold off on today's Xgeva.  5.  Weight loss: She is taking Marinol 2.5 mg twice daily.  She is drinking can of Ensure/boost daily.  Weight is more or less stable.

## 2018-06-20 NOTE — Progress Notes (Signed)
[place], [place] [id number]   CLINIC:  Medical Oncology/Hematology  PCP:  [name], MD [place] [address] [phone]   REASON FOR VISIT:  Follow-up for metastatic lung cancer  CURRENT THERAPY: Opdivo every 4 weeks  BRIEF ONCOLOGIC HISTORY:    Cancer of upper lobe of right lung ([place])   07/28/2014 Imaging    CT chest: Large R apical mass consistent with malignancy. This is destroying the R 2nd rib with extension into adjacent soft tissue. R hilar adenopathy with R 5cm adrenal metastatic lesion.      08/01/2014 Initial Biopsy    Lung, needle/core biopsy(ies), right upper lobe - POORLY DIFFERENTIATED ADENOCARCINOMA, SEE COMMENT.      08/08/2014 PET scan    Large hypermetabolic R apical mass with evidence of direct chest wall and mediastinal invasion, right retrocrural lymphadenopathy, extensive retroperitoneal lymphadenopathy, and metastatic lesions to the adrenal glands       09/02/2014 - 11/04/2014 Chemotherapy    Cisplatin/Pemetrexed/Avastin every 21 days x 4 cycles      10/07/2014 - 10/27/2014 Radiation Therapy    Right lung apex for control of brachioplexopathy.      12/24/2014 - 02/25/2015 Chemotherapy    Alimta/Avastin every 21 days.      02/20/2015 Imaging    Increase in size of right adrenal metastasis and subjacent confluent retrocaval lymphadenopathy      02/25/2015 -  Chemotherapy    Nivolumab, zometa      05/04/2015 Imaging    CT CAP- Stable to slight decrease in the posterior right apical lesion. Stable appearance of posterior right upper rib an upper thoracic bony lesions. Slight improvement in right upper lobe tree-in-bud opacity. No new or progressive findings in...      07/28/2015 Imaging    CT CAP- Reduced size of the right apical pleural parenchymal lesion and reduced size of the right adrenal metastatic lesion. Resolution of prior retrocrural adenopathy.  Right eccentric T1 and T2 sclerosis  with sclerosis and tapering of the right second..      11/17/2015 Imaging    CT CAP- Stable soft tissue thickening in the apex of the right hemi thorax. Stable right adrenal metastasis. Nodularity along the trachea and mainstem bronchi, relatively new from 07/28/2015, favoring adherent debris.      11/18/2015 Treatment Plan Change    Zometa HELD for upcoming tooth extraction      11/24/2015 Treatment Plan Change    Zometa on hold at this time in preparation for tooth extraction in March 2017.  Zometa las given on 11/18/2015.      02/03/2016 Imaging    CT CAP- Heterogeneous right apical masslike consolidation and right adrenal metastasis are unchanged      04/06/2016 Treatment Plan Change    Zometa restarted 6 weeks out from tooth extraction (04/06/2016)      05/12/2016 Imaging    CT CAP- NED in the chest, abdomen or pelvis.  Some areas of nodularity associated with the mainstem bronchi in the left upper lobe bronchus, favored to represent adherent inspissated secretions      08/17/2016 Imaging    CT CAP- 1. Stable CTs of the chest and abdomen. No evidence of progressive metastatic disease. 2. Probable treated tumor at the right apex, right adrenal gland and T2 vertebral body, stable. 3. Fluctuating nodularity along the walls of the trachea and mainstem bronchi, likely secretions.      12/12/2016 Imaging  Further decrease in size of treated tumor within the right apex. 2. Stable treated tumor involving the right second rib and T2 vertebra. 3. Stable right adrenal gland treated tumor. 4. Emphysema 5. Aortic atherosclerosis      01/04/2017 Imaging    MRI brain- Normal brain MRI.  No intracranial metastatic disease.      03/15/2017 Imaging    CT CAP- 1. No new or progressive metastatic disease in the chest or abdomen. 2. Stable treated tumor in the apical right upper lobe. Stable treated right posterior second rib and right T2 vertebral lesions. Stable treated right adrenal  metastasis. 3. Aortic atherosclerosis. 4. Moderate emphysema with mild diffuse bronchial wall thickening, suggesting COPD.      06/12/2017 Imaging    CT CAP 1. Similar appearance of treated primary within the right apex. 2. Similar areas of sclerosis within the right second rib, T2, and less so T1 vertebral bodies. These are most consistent with treated metastasis. 3. Similar right adrenal treated metastasis. 4. No evidence of new or progressive disease. 5. Similar right and progressive left areas of bronchial wall thickening and probable mucoid impaction. Correlate with interval infectious symptoms. 6.  Emphysema (ICD10-J43.9). 7. Coronary artery atherosclerosis. Aortic Atherosclerosis (ICD10-I70.0).       10/16/2017 Imaging    CT CAP 1. Stable appearance of the prior Pancoast tumor and related bony findings in the right second rib and right T1 and T2 vertebra compatible with successfully treated tumor. No significant enlargement or new lesions are identified. Similarly the treated right adrenal metastatic lesion is stable in appearance. 2. Upper normal size right hilar lymph node may warrant surveillance. Currently 9 mm in short axis. 3. Other imaging findings of potential clinical significance: Aortic Atherosclerosis (ICD10-I70.0) and Emphysema (ICD10-J43.9). Scattered proximal sigmoid colon diverticula. Mucus plugging medially in the left upper lobe and posteriorly in the right upper lobe.         INTERVAL HISTORY:  Ms. Nop 62 y.o. female returns for routine follow-up for metastatic lung cancer. Patient is here today with her husband. She is feeling good and tolerating treatment well. She is still active however her appetite isnt as good as normal. She has lost 3 pounds since her last visit. She continues to drink ensure daily. Her energy levels are remaining good at 75%. Patient denies any aches or pains. Denies any skin rashes. Denies any nausea, vomiting, or  diarrhea.  She is requesting to hold off on her shot today due to dental work that needs to be done. She need to be a month out from the shot before they will perform the procedure. She will have this done in the next two weeks and call to reschedule her monthly shots.    REVIEW OF SYSTEMS:  Review of Systems  All other systems reviewed and are negative.    PAST MEDICAL/SURGICAL HISTORY:  Past Medical History:  Diagnosis Date  . Adrenal mass, right ([place]) 07/28/2014  . Anemia   . Bone metastases ([place]) 04/05/2016  . Cancer ([place])    lung  right  . Diabetes mellitus without complication ([place])   . GERD (gastroesophageal reflux disease)   . Hyperlipidemia   . Hypertension   . Hypothyroidism due to medication 01/30/2017  . Lung mass 07/28/2014  . Reflux    Past Surgical History:  Procedure Laterality Date  . AMPUTATION Right 02/22/2017   Procedure: PARTIAL AMPUTATION RIGHT GREAT TOE;  Surgeon: Caprice Beaver, DPM;  Location: [facility];  Service: Podiatry;  Laterality: Right;  .  APPENDECTOMY    . ESOPHAGOGASTRODUODENOSCOPY N/A 12/09/2014   ZOX:WRUEAV esophageal stricture/mild-to-noderate erosive gastritis. negative H.pylori  . FLEXIBLE SIGMOIDOSCOPY  2011   Dr. [name]: hyperplastic polyp  . LUNG BIOPSY Right 07/2014   CT guided  . MALONEY DILATION N/A 12/09/2014   Procedure: [name] DILATION;  Surgeon: [name], MD;  Location: [facility];  Service: Endoscopy;  Laterality: N/A;  . PORTACATH PLACEMENT Left 09/01/2014  . SAVORY DILATION N/A 12/09/2014   Procedure: SAVORY DILATION;  Surgeon: [name], MD;  Location: [facility];  Service: Endoscopy;  Laterality: N/A;     SOCIAL HISTORY:  Social History   Socioeconomic History  . Marital status: Married    Spouse name: Not on file  . Number of children: Not on file  . Years of education: Not on file  . Highest education level: Not on file  Occupational History  . Not on file  Social Needs  . Financial resource strain:  Not on file  . Food insecurity:    Worry: Not on file    Inability: Not on file  . Transportation needs:    Medical: Not on file    Non-medical: Not on file  Tobacco Use  . Smoking status: Light Tobacco Smoker    Packs/day: 0.30    Years: 20.00    Pack years: 6.00    Types: Cigarettes  . Smokeless tobacco: Never Used  Substance and Sexual Activity  . Alcohol use: No  . Drug use: No  . Sexual activity: Not on file  Lifestyle  . Physical activity:    Days per week: Not on file    Minutes per session: Not on file  . Stress: Not on file  Relationships  . Social connections:    Talks on phone: Not on file    Gets together: Not on file    Attends religious service: Not on file    Active member of club or organization: Not on file    Attends meetings of clubs or organizations: Not on file    Relationship status: Not on file  . Intimate partner violence:    Fear of current or ex partner: Not on file    Emotionally abused: Not on file    Physically abused: Not on file    Forced sexual activity: Not on file  Other Topics Concern  . Not on file  Social History Narrative  . Not on file    FAMILY HISTORY:  Family History  Problem Relation Age of Onset  . Cancer Sister   . Colon cancer Neg Hx     CURRENT MEDICATIONS:  Outpatient Encounter Medications as of 06/20/2018  Medication Sig  . Calcium Carb-Cholecalciferol (CALCIUM 1000 + D PO) Take 1 tablet by mouth daily.  Marland Kitchen denosumab (XGEVA) 120 MG/1.7ML SOLN injection Inject 120 mg into the skin every 28 (twenty-eight) days.  Marland Kitchen dronabinol (MARINOL) 2.5 MG capsule Take 1 capsule (2.5 mg total) by mouth 2 (two) times daily before lunch and supper.  Marland Kitchen ENSURE (ENSURE) Take 1 Can by mouth 4 (four) times daily.  . Fluticasone-Salmeterol (ADVAIR DISKUS) 500-50 MCG/DOSE AEPB Inhale 1 puff into the lungs 2 (two) times daily. (Patient taking differently: Inhale 1 puff into the lungs 2 (two) times daily as needed (for respiratory issues.). )   . ibuprofen (ADVIL,MOTRIN) 200 MG tablet Take 400 mg by mouth every 8 (eight) hours as needed (for pain.).  Marland Kitchen levothyroxine (SYNTHROID, LEVOTHROID) 25 MCG tablet TAKE 1/2 (ONE-HALF) TABLET  BY MOUTH ONCE DAILY BEFORE BREAKFAST  . lidocaine-prilocaine (EMLA) cream APPLY A QUARTER SIZE AMOUNT TO PORT SITE 1 HOUR PRIOR TO CHEMO.  DO NOT RUB IN.  COVER WITH PLASTIC WRAP  . magic mouthwash SOLN Take 5 mLs by mouth 4 (four) times daily as needed for mouth pain.  . Nivolumab (OPDIVO IV) Inject into the vein every 28 (twenty-eight) days.  Marland Kitchen omeprazole (PRILOSEC) 40 MG capsule Take 1 capsule (40 mg total) by mouth daily.  . ondansetron (ZOFRAN) 8 MG tablet Take 1 tablet (8 mg total) by mouth every 8 (eight) hours as needed for nausea or vomiting.  . Oxycodone HCl 10 MG TABS Take 1-2 tablets (10-20 mg total) by mouth every 6 (six) hours as needed (pain.).  Marland Kitchen simvastatin (ZOCOR) 40 MG tablet TAKE ONE TABLET BY MOUTH ONCE DAILY IN THE MORNING   No facility-administered encounter medications on file as of 06/20/2018.     ALLERGIES:  No Known Allergies   PHYSICAL EXAM:  ECOG Performance status: 1  Vitals:   06/20/18 1056  BP: 129/70  Pulse: 74  Resp: 16  Temp: 98.4 F (36.9 C)  SpO2: 100%   Filed Weights   06/20/18 1056  Weight: 93 lb 8 oz (42.4 kg)    Physical Exam  Constitutional: She is oriented to person, place, and time.  Cardiovascular: Normal rate, regular rhythm and normal heart sounds.  Pulmonary/Chest: Effort normal and breath sounds normal.  Neurological: She is alert and oriented to person, place, and time.  Skin: Skin is warm and dry.     LABORATORY DATA:  I have reviewed the labs as listed.  CBC    Component Value Date/Time   WBC 10.1 06/20/2018 0843   RBC 4.35 06/20/2018 0843   HGB 12.9 06/20/2018 0843   HCT 39.6 06/20/2018 0843   PLT 282 06/20/2018 0843   MCV 91.0 06/20/2018 0843   MCH 29.7 06/20/2018 0843   MCHC 32.6 06/20/2018 0843   RDW 15.9 (H) 06/20/2018  0843   LYMPHSABS 2.5 06/20/2018 0843   MONOABS 0.8 06/20/2018 0843   EOSABS 0.2 06/20/2018 0843   BASOSABS 0.0 06/20/2018 0843   CMP Latest Ref Rng & Units 06/20/2018 05/16/2018 04/18/2018  Glucose 70 - 99 mg/dL 100(H) 90 142(H)  BUN 8 - 23 mg/dL 13 18 15   Creatinine 0.44 - 1.00 mg/dL 1.51(H) 1.36(H) 1.16(H)  Sodium 135 - 145 mmol/L 138 139 136  Potassium 3.5 - 5.1 mmol/L 4.2 4.3 3.8  Chloride 98 - 111 mmol/L 102 106 104  CO2 22 - 32 mmol/L 27 25 25   Calcium 8.9 - 10.3 mg/dL 10.1 9.5 9.5  Total Protein 6.5 - 8.1 g/dL 8.3(H) 7.9 7.5  Total Bilirubin 0.3 - 1.2 mg/dL 0.3 0.2(L) 0.4  Alkaline Phos 38 - 126 U/L 74 79 60  AST 15 - 41 U/L 17 18 20   ALT 0 - 44 U/L 12 14 9(L)       DIAGNOSTIC IMAGING:  CT scan of the chest, abdomen and pelvis dated 06/07/2018 showed stable right upper lobe lung mass and right adrenal meta stasis.  I discussed this with the patient.     ASSESSMENT & PLAN:   Cancer of upper lobe of right lung 1.  Metastatic poorly differentiated adenocarcinoma of the lung to the bones and right adrenal: -Foundation 1 testing with 14 genomic alterations, no approved FDA therapies and the patient tumor type - On opdivo 480 mg monthly started on 02/25/2015 -I reviewed the results of the  CT scan of the chest, abdomen and pelvis dated 06/07/2018 which showed stable right upper lobe lung mass and right adrenal meta stasis. - She is tolerating Opdivo very well.  She will proceed with next cycle.  She not have any immunotherapy related side effects.  We will see her back in 3 months with repeat scans.  2.  CKD: Creatinine is 1.5 today.  Her normal baseline which is around 1.2-1.3.  We have recommended drinking plenty of fluids.  3.  Right shoulder pain and pain in the back of the legs: She is taking oxycodone 10 mg 1 to 2 tablets/day.  She mostly takes 1 tablet in the evenings.  4.  Bone metastases: She will need to have dental work done.  I will hold off on today's Xgeva.  5.   Weight loss: She is taking Marinol 2.5 mg twice daily.  She is drinking can of Ensure/boost daily.  Weight is more or less stable.      Orders placed this encounter:  Orders Placed This Encounter  Procedures  . CT Chest W Contrast  . CT Abdomen Pelvis W Contrast  . TSH  . Lactate dehydrogenase  . CBC with Differential/Platelet  . Comprehensive metabolic panel  . CBC with Differential/Platelet  . Comprehensive metabolic panel      Derek Jack, MD [place] [phone]

## 2018-06-20 NOTE — Progress Notes (Signed)
Hold Xgeva today per Dr. Delton Coombes - pt is scheduled for upcoming dental work.

## 2018-06-30 DIAGNOSIS — R69 Illness, unspecified: Secondary | ICD-10-CM | POA: Diagnosis not present

## 2018-06-30 DIAGNOSIS — K08409 Partial loss of teeth, unspecified cause, unspecified class: Secondary | ICD-10-CM | POA: Diagnosis not present

## 2018-06-30 DIAGNOSIS — J449 Chronic obstructive pulmonary disease, unspecified: Secondary | ICD-10-CM | POA: Diagnosis not present

## 2018-06-30 DIAGNOSIS — E039 Hypothyroidism, unspecified: Secondary | ICD-10-CM | POA: Diagnosis not present

## 2018-06-30 DIAGNOSIS — E119 Type 2 diabetes mellitus without complications: Secondary | ICD-10-CM | POA: Diagnosis not present

## 2018-06-30 DIAGNOSIS — G893 Neoplasm related pain (acute) (chronic): Secondary | ICD-10-CM | POA: Diagnosis not present

## 2018-06-30 DIAGNOSIS — K219 Gastro-esophageal reflux disease without esophagitis: Secondary | ICD-10-CM | POA: Diagnosis not present

## 2018-06-30 DIAGNOSIS — E785 Hyperlipidemia, unspecified: Secondary | ICD-10-CM | POA: Diagnosis not present

## 2018-06-30 DIAGNOSIS — C3490 Malignant neoplasm of unspecified part of unspecified bronchus or lung: Secondary | ICD-10-CM | POA: Diagnosis not present

## 2018-06-30 DIAGNOSIS — G8929 Other chronic pain: Secondary | ICD-10-CM | POA: Diagnosis not present

## 2018-07-04 ENCOUNTER — Other Ambulatory Visit: Payer: Self-pay | Admitting: Gastroenterology

## 2018-07-07 IMAGING — MR MR TOES*R* WO/W CM
4 of 9 series · 19 of 40 positions shown · IV contrast (multihance)
Comparison: None.

CLINICAL DATA: Open wound of the right great toe after removal of
an ingrown toenail. Pain along toe and first digit metatarsal.
History of lung cancer. Diabetes.

EXAM:
MRI OF THE RIGHT TOES WITHOUT AND WITH CONTRAST
TECHNIQUE: Multiplanar, multisequence MR imaging of the right forefoot and toes
was performed both before and after administration of intravenous
contrast.
CONTRAST:  8mL MULTIHANCE GADOBENATE DIMEGLUMINE 529 MG/ML IV SOLN

[Series 10: T1 · axial · 3.0mm · 0.25mm/px · z∈[-58,+68]mm · 6 of 39 slices shown (1 of 2)]
[im 1/39]
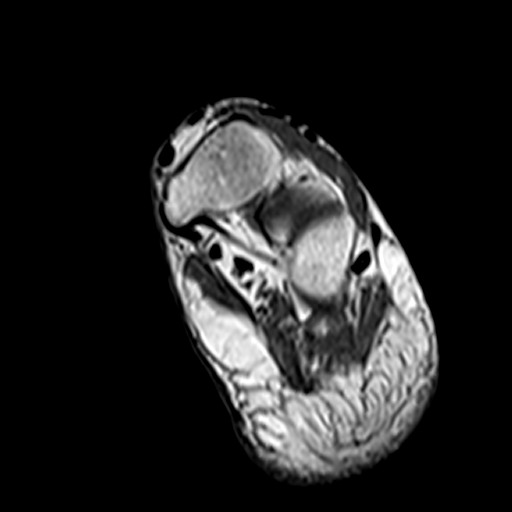
[im 8/39]
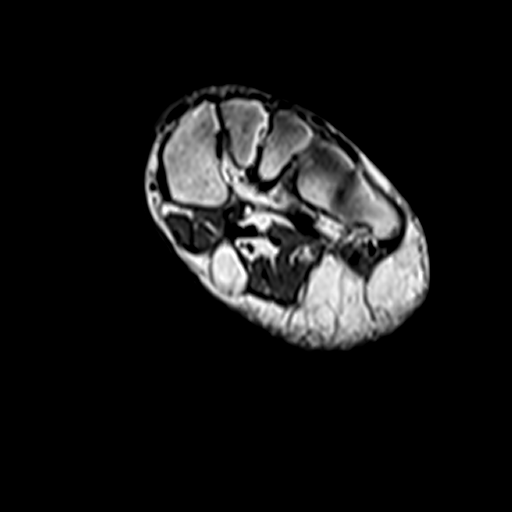
[im 16/39]
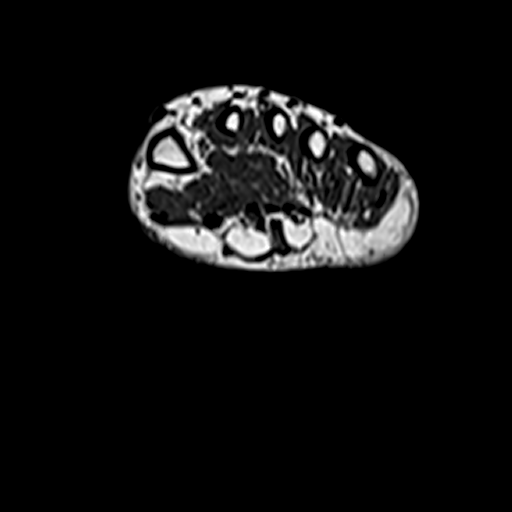
[im 23/39]
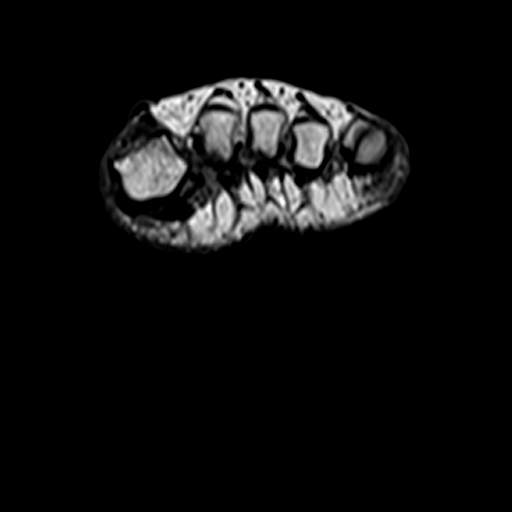
[im 31/39]
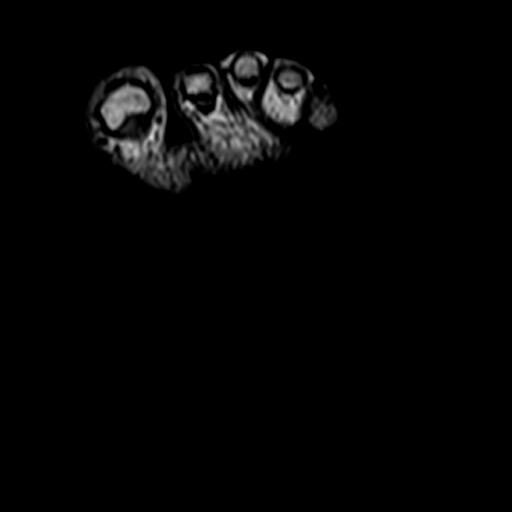
[im 39/39]
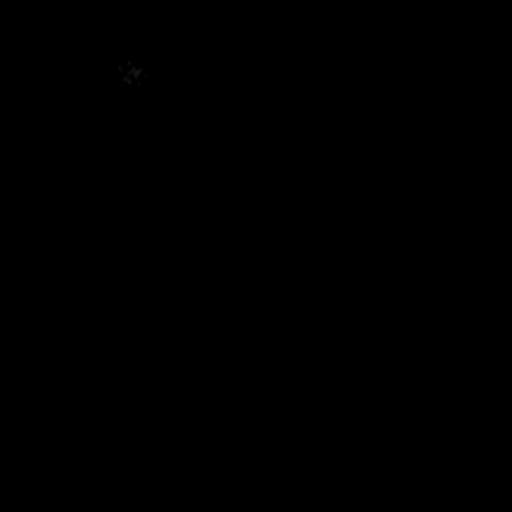

[Series 11: t1fs axial · axial · 3.0mm · 0.25mm/px · z∈[-58,+68]mm · 6 of 39 slices shown]
[im 1/39]
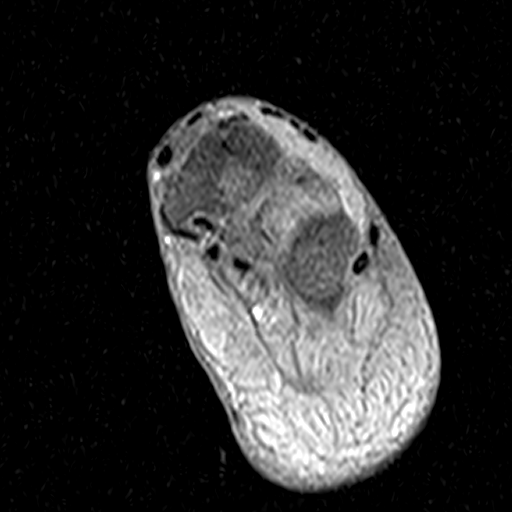
[im 8/39]
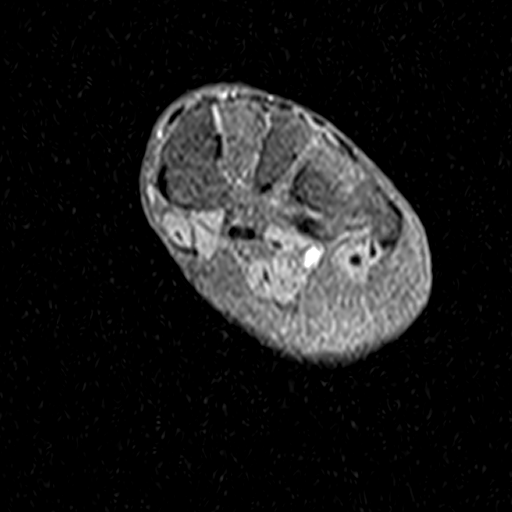
[im 16/39]
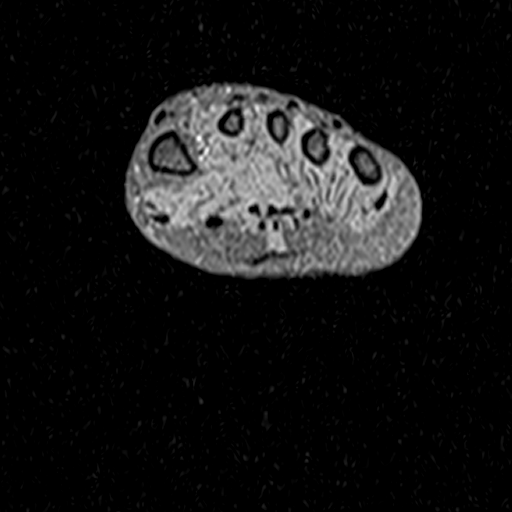
[im 23/39]
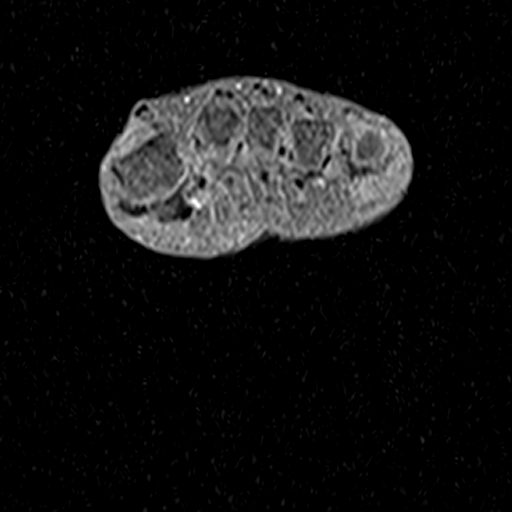
[im 31/39]
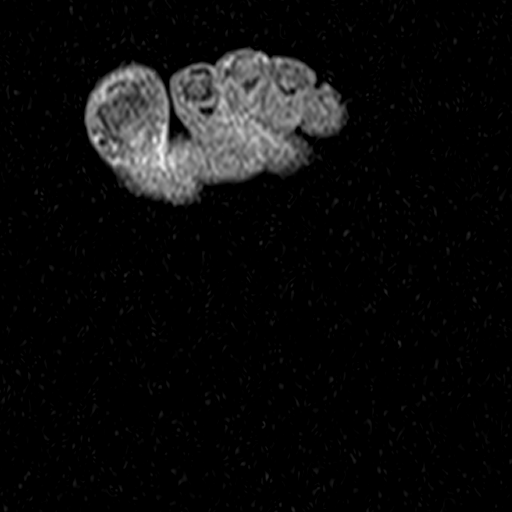
[im 39/39]
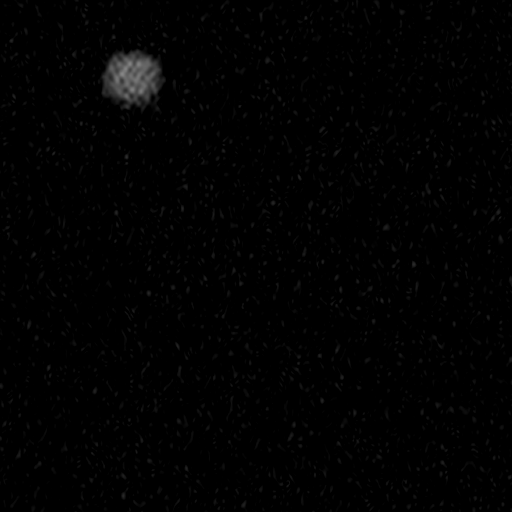

[Series 13: t2fs axial · axial · 3.0mm · 0.25mm/px · z∈[-58,+68]mm · 4 of 39 slices shown]
[im 1/39]
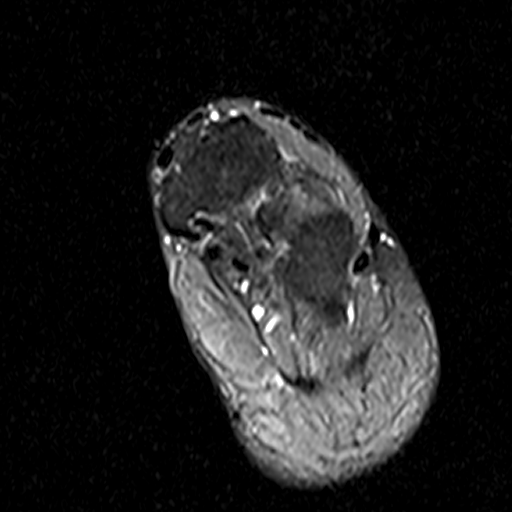
[im 8/39]
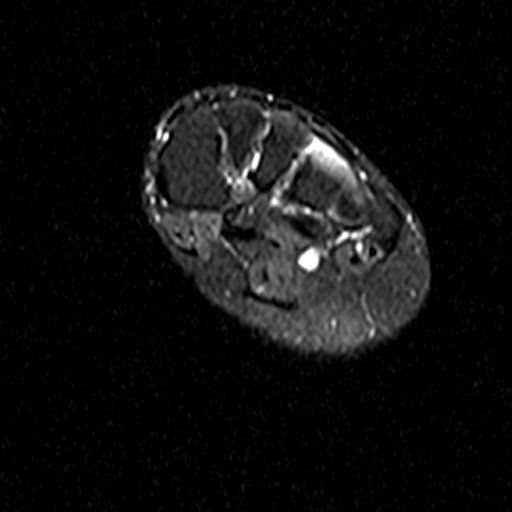
[im 23/39]
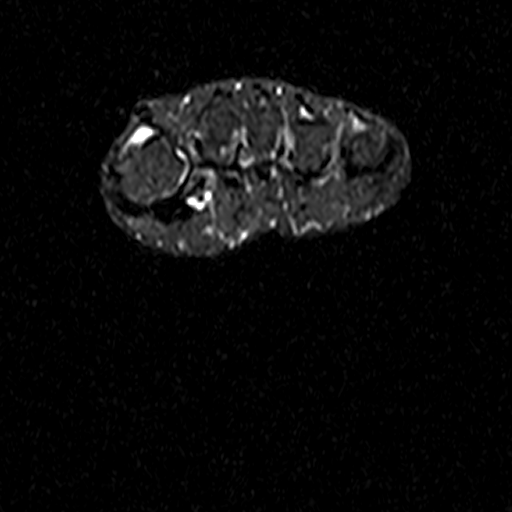
[im 39/39]
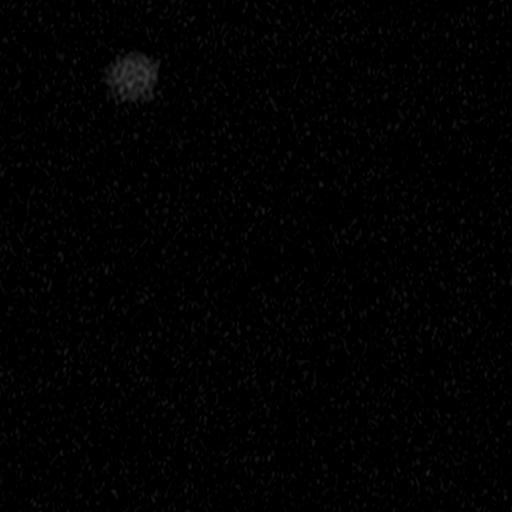

[Series 16: T1 · sagittal · 3.0mm · 0.34mm/px · 3 of 23 slices shown (2 of 2)]
[im 1/23]
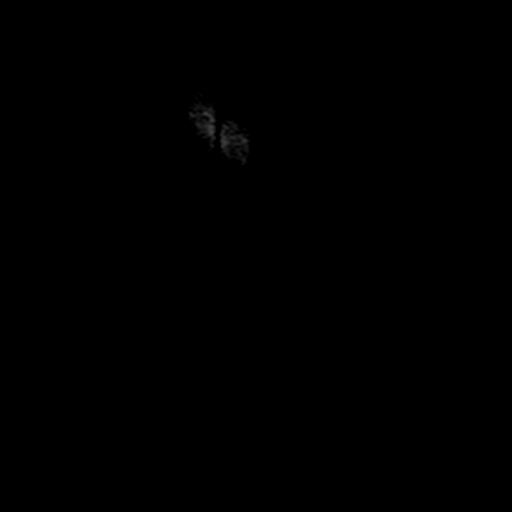
[im 12/23]
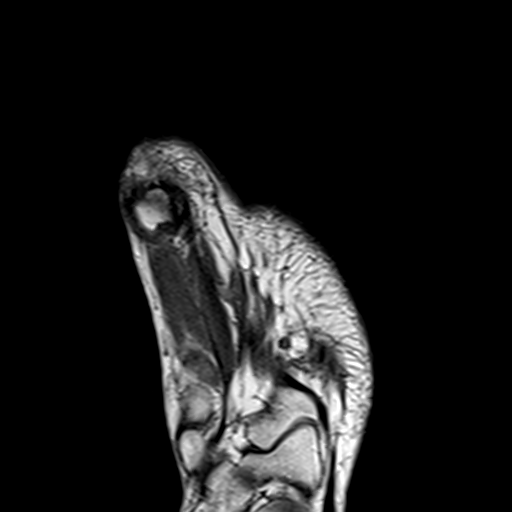
[im 23/23]
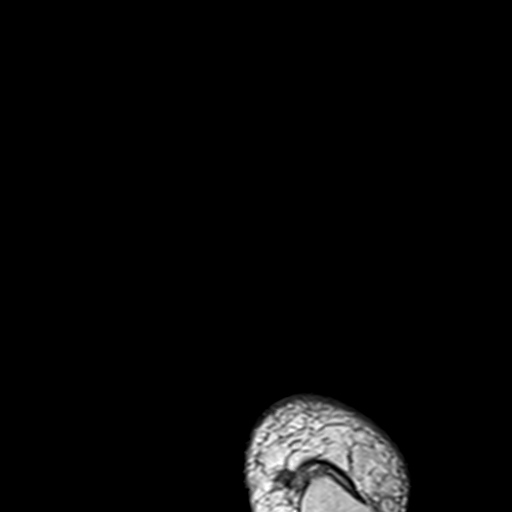

[19 of 40 positions shown; findings below may reference images not displayed]

FINDINGS: Bones/Joint/Cartilage

Abnormal edema and enhancement along with cortical demineralization
distally in the distal phalanx of the great toe compatible with
osteomyelitis. Irregularity along the medial nail bed with
questionable soft tissue defect in this vicinity. Small cystic
lesion in the soft tissues distal to the tuft of the distal phalanx
with enhancing margins, possibly a tiny abscess, 4 mm diameter. The
however, this could be draining to the medial nail bed.

No other osteomyelitis in the forefoot is identified. No
malalignment at the Lisfranc joint.

Ligaments

Lisfranc ligament is intact, image 17/22.

Muscles and Tendons

Unremarkable

Soft tissues

There is likely some localized cellulitis adjacent to the nail bed
of the great toe. Otherwise unremarkable.
IMPRESSION: 1. Osteomyelitis of the distal phalanx of the great toe.
2. Potential small microabscess along the soft tissues distal to the
tuft of the distal phalanx great toe. Soft tissue irregularity along
the medial nail bed with questionable continuity extending towards
this small 4 mm enhancing cystic collection.

## 2018-07-10 DIAGNOSIS — R69 Illness, unspecified: Secondary | ICD-10-CM | POA: Diagnosis not present

## 2018-07-12 ENCOUNTER — Encounter: Payer: Self-pay | Admitting: Gastroenterology

## 2018-07-18 ENCOUNTER — Ambulatory Visit (HOSPITAL_COMMUNITY): Payer: Medicare HMO | Admitting: Hematology

## 2018-07-18 ENCOUNTER — Inpatient Hospital Stay (HOSPITAL_COMMUNITY): Payer: Medicare HMO | Attending: Hematology

## 2018-07-18 ENCOUNTER — Inpatient Hospital Stay (HOSPITAL_COMMUNITY): Payer: Medicare HMO

## 2018-07-18 ENCOUNTER — Encounter (HOSPITAL_COMMUNITY): Payer: Self-pay

## 2018-07-18 VITALS — BP 113/61 | HR 75 | Temp 98.4°F | Resp 16 | Wt 94.2 lb

## 2018-07-18 DIAGNOSIS — C3411 Malignant neoplasm of upper lobe, right bronchus or lung: Secondary | ICD-10-CM | POA: Diagnosis not present

## 2018-07-18 DIAGNOSIS — E278 Other specified disorders of adrenal gland: Secondary | ICD-10-CM

## 2018-07-18 DIAGNOSIS — Z79899 Other long term (current) drug therapy: Secondary | ICD-10-CM | POA: Insufficient documentation

## 2018-07-18 DIAGNOSIS — Z5112 Encounter for antineoplastic immunotherapy: Secondary | ICD-10-CM | POA: Insufficient documentation

## 2018-07-18 DIAGNOSIS — C7971 Secondary malignant neoplasm of right adrenal gland: Secondary | ICD-10-CM | POA: Diagnosis not present

## 2018-07-18 DIAGNOSIS — C7951 Secondary malignant neoplasm of bone: Secondary | ICD-10-CM | POA: Diagnosis not present

## 2018-07-18 LAB — CBC WITH DIFFERENTIAL/PLATELET
Basophils Absolute: 0 10*3/uL (ref 0.0–0.1)
Basophils Relative: 0 %
EOS PCT: 1 %
Eosinophils Absolute: 0.1 10*3/uL (ref 0.0–0.7)
HCT: 40.4 % (ref 36.0–46.0)
HEMOGLOBIN: 13 g/dL (ref 12.0–15.0)
LYMPHS ABS: 2.6 10*3/uL (ref 0.7–4.0)
LYMPHS PCT: 30 %
MCH: 28.9 pg (ref 26.0–34.0)
MCHC: 32.2 g/dL (ref 30.0–36.0)
MCV: 89.8 fL (ref 78.0–100.0)
MONOS PCT: 8 %
Monocytes Absolute: 0.7 10*3/uL (ref 0.1–1.0)
Neutro Abs: 5.4 10*3/uL (ref 1.7–7.7)
Neutrophils Relative %: 61 %
PLATELETS: 274 10*3/uL (ref 150–400)
RBC: 4.5 MIL/uL (ref 3.87–5.11)
RDW: 15.8 % — ABNORMAL HIGH (ref 11.5–15.5)
WBC: 8.8 10*3/uL (ref 4.0–10.5)

## 2018-07-18 LAB — COMPREHENSIVE METABOLIC PANEL
ALK PHOS: 71 U/L (ref 38–126)
ALT: 13 U/L (ref 0–44)
ANION GAP: 6 (ref 5–15)
AST: 17 U/L (ref 15–41)
Albumin: 3.9 g/dL (ref 3.5–5.0)
BUN: 13 mg/dL (ref 8–23)
CALCIUM: 10.1 mg/dL (ref 8.9–10.3)
CO2: 27 mmol/L (ref 22–32)
CREATININE: 1.16 mg/dL — AB (ref 0.44–1.00)
Chloride: 105 mmol/L (ref 98–111)
GFR, EST AFRICAN AMERICAN: 58 mL/min — AB (ref >60)
GFR, EST NON AFRICAN AMERICAN: 50 mL/min — AB (ref >60)
Glucose, Bld: 113 mg/dL — ABNORMAL HIGH (ref 70–99)
Potassium: 4.1 mmol/L (ref 3.5–5.1)
SODIUM: 138 mmol/L (ref 135–145)
Total Bilirubin: 0.4 mg/dL (ref 0.3–1.2)
Total Protein: 8.2 g/dL — ABNORMAL HIGH (ref 6.5–8.1)

## 2018-07-18 MED ORDER — SODIUM CHLORIDE 0.9 % IV SOLN
Freq: Once | INTRAVENOUS | Status: AC
  Administered 2018-07-18: 12:00:00 via INTRAVENOUS

## 2018-07-18 MED ORDER — HEPARIN SOD (PORK) LOCK FLUSH 100 UNIT/ML IV SOLN
500.0000 [IU] | Freq: Once | INTRAVENOUS | Status: AC | PRN
  Administered 2018-07-18: 500 [IU]

## 2018-07-18 MED ORDER — SODIUM CHLORIDE 0.9 % IV SOLN
480.0000 mg | Freq: Once | INTRAVENOUS | Status: AC
  Administered 2018-07-18: 480 mg via INTRAVENOUS
  Filled 2018-07-18: qty 48

## 2018-07-18 MED ORDER — SODIUM CHLORIDE 0.9% FLUSH
10.0000 mL | INTRAVENOUS | Status: DC | PRN
  Administered 2018-07-18: 10 mL
  Filled 2018-07-18: qty 10

## 2018-07-18 NOTE — Progress Notes (Signed)
Melissa Sullivan tolerated Opdivo infusion well without complaints or incident. Labs reviewed prior to administering this medication.Xgeva injection held today since pt is having dental procedure next week VSS upon discharge. Pt discharged self ambulatory in satisfactory condition accompanied by family member

## 2018-07-18 NOTE — Patient Instructions (Signed)
[facility] Discharge Instructions for Patients Receiving Chemotherapy   Beginning January 23rd 2017 lab work for the [facility] will be done in the  Main lab at [facility] on 1st floor. If you have a lab appointment with the [place] please come in thru the  Main Entrance and check in at the main information desk   Today you received the following chemotherapy agents Opdivo. Follow-up as scheduled. Call clinic for any questions or concerns  To help prevent nausea and vomiting after your treatment, we encourage you to take your nausea medication   If you develop nausea and vomiting, or diarrhea that is not controlled by your medication, call the clinic.  The clinic phone number is [phone]. Office hours are Monday-Friday 8:30am-5:00pm.  BELOW ARE SYMPTOMS THAT SHOULD BE REPORTED IMMEDIATELY:  *FEVER GREATER THAN 101.0 F  *CHILLS WITH OR WITHOUT FEVER  NAUSEA AND VOMITING THAT IS NOT CONTROLLED WITH YOUR NAUSEA MEDICATION  *UNUSUAL SHORTNESS OF BREATH  *UNUSUAL BRUISING OR BLEEDING  TENDERNESS IN MOUTH AND THROAT WITH OR WITHOUT PRESENCE OF ULCERS  *URINARY PROBLEMS  *BOWEL PROBLEMS  UNUSUAL RASH Items with * indicate a potential emergency and should be followed up as soon as possible. If you have an emergency after office hours please contact your primary care physician or go to the nearest emergency department.  Please call the clinic during office hours if you have any questions or concerns.   You may also contact the Patient Navigator at [phone] should you have any questions or need assistance in obtaining follow up care.      Resources For Cancer Patients and their Caregivers ? American Cancer Society: Can assist with transportation, wigs, general needs, runs Look Good Feel Better.        316-501-4829 ? Cancer Care: Provides financial assistance, online support groups, medication/co-pay assistance.  1-800-813-HOPE  339-858-3888) ? [place] Assists [place] cancer patients and their families through emotional , educational and financial support.  203-629-3344 ? [place] DSS Where to apply for food stamps, Medicaid and utility assistance. (629)770-2527 ? RCATS: Transportation to medical appointments. 801-817-0924 ? Social Security Administration: May apply for disability if have a Stage IV cancer. 210-512-7714 252-769-8486 ? [employer], Disability and Transit Services: Assists with nutrition, care and transit needs. [phone]

## 2018-07-26 ENCOUNTER — Ambulatory Visit: Payer: Medicare HMO | Admitting: Gastroenterology

## 2018-07-26 DIAGNOSIS — R69 Illness, unspecified: Secondary | ICD-10-CM | POA: Diagnosis not present

## 2018-08-06 ENCOUNTER — Other Ambulatory Visit (HOSPITAL_COMMUNITY): Payer: Self-pay | Admitting: Oncology

## 2018-08-06 DIAGNOSIS — E032 Hypothyroidism due to medicaments and other exogenous substances: Secondary | ICD-10-CM

## 2018-08-07 DIAGNOSIS — B351 Tinea unguium: Secondary | ICD-10-CM | POA: Diagnosis not present

## 2018-08-07 DIAGNOSIS — C3490 Malignant neoplasm of unspecified part of unspecified bronchus or lung: Secondary | ICD-10-CM | POA: Diagnosis not present

## 2018-08-07 DIAGNOSIS — G629 Polyneuropathy, unspecified: Secondary | ICD-10-CM | POA: Diagnosis not present

## 2018-08-09 ENCOUNTER — Ambulatory Visit: Payer: 59 | Admitting: Gastroenterology

## 2018-08-13 DIAGNOSIS — E039 Hypothyroidism, unspecified: Secondary | ICD-10-CM | POA: Diagnosis not present

## 2018-08-13 DIAGNOSIS — E782 Mixed hyperlipidemia: Secondary | ICD-10-CM | POA: Diagnosis not present

## 2018-08-13 DIAGNOSIS — D508 Other iron deficiency anemias: Secondary | ICD-10-CM | POA: Diagnosis not present

## 2018-08-13 DIAGNOSIS — R7301 Impaired fasting glucose: Secondary | ICD-10-CM | POA: Diagnosis not present

## 2018-08-15 ENCOUNTER — Encounter (HOSPITAL_COMMUNITY): Payer: Self-pay

## 2018-08-15 ENCOUNTER — Other Ambulatory Visit (HOSPITAL_COMMUNITY): Payer: Self-pay | Admitting: Nurse Practitioner

## 2018-08-15 ENCOUNTER — Inpatient Hospital Stay (HOSPITAL_COMMUNITY): Payer: Medicare HMO | Attending: Hematology

## 2018-08-15 ENCOUNTER — Inpatient Hospital Stay (HOSPITAL_COMMUNITY): Payer: Medicare HMO

## 2018-08-15 VITALS — BP 120/58 | HR 78 | Temp 98.5°F | Resp 18 | Wt 95.6 lb

## 2018-08-15 DIAGNOSIS — C7951 Secondary malignant neoplasm of bone: Secondary | ICD-10-CM | POA: Diagnosis not present

## 2018-08-15 DIAGNOSIS — C7971 Secondary malignant neoplasm of right adrenal gland: Secondary | ICD-10-CM | POA: Insufficient documentation

## 2018-08-15 DIAGNOSIS — R630 Anorexia: Secondary | ICD-10-CM

## 2018-08-15 DIAGNOSIS — C3411 Malignant neoplasm of upper lobe, right bronchus or lung: Secondary | ICD-10-CM

## 2018-08-15 DIAGNOSIS — E278 Other specified disorders of adrenal gland: Secondary | ICD-10-CM

## 2018-08-15 DIAGNOSIS — Z5112 Encounter for antineoplastic immunotherapy: Secondary | ICD-10-CM | POA: Insufficient documentation

## 2018-08-15 DIAGNOSIS — Z79899 Other long term (current) drug therapy: Secondary | ICD-10-CM | POA: Insufficient documentation

## 2018-08-15 LAB — CBC WITH DIFFERENTIAL/PLATELET
BASOS ABS: 0 10*3/uL (ref 0.0–0.1)
BASOS PCT: 0 %
Eosinophils Absolute: 0.1 10*3/uL (ref 0.0–0.7)
Eosinophils Relative: 1 %
HCT: 40.2 % (ref 36.0–46.0)
HEMOGLOBIN: 13.1 g/dL (ref 12.0–15.0)
LYMPHS ABS: 2.9 10*3/uL (ref 0.7–4.0)
Lymphocytes Relative: 31 %
MCH: 29.6 pg (ref 26.0–34.0)
MCHC: 32.6 g/dL (ref 30.0–36.0)
MCV: 91 fL (ref 78.0–100.0)
Monocytes Absolute: 0.7 10*3/uL (ref 0.1–1.0)
Monocytes Relative: 7 %
NEUTROS PCT: 61 %
Neutro Abs: 5.7 10*3/uL (ref 1.7–7.7)
Platelets: 268 10*3/uL (ref 150–400)
RBC: 4.42 MIL/uL (ref 3.87–5.11)
RDW: 16.1 % — ABNORMAL HIGH (ref 11.5–15.5)
WBC: 9.3 10*3/uL (ref 4.0–10.5)

## 2018-08-15 LAB — COMPREHENSIVE METABOLIC PANEL
ALBUMIN: 4.2 g/dL (ref 3.5–5.0)
ALK PHOS: 65 U/L (ref 38–126)
ALT: 11 U/L (ref 0–44)
AST: 16 U/L (ref 15–41)
Anion gap: 9 (ref 5–15)
BUN: 12 mg/dL (ref 8–23)
CHLORIDE: 103 mmol/L (ref 98–111)
CO2: 25 mmol/L (ref 22–32)
CREATININE: 1.18 mg/dL — AB (ref 0.44–1.00)
Calcium: 10 mg/dL (ref 8.9–10.3)
GFR calc Af Amer: 56 mL/min — ABNORMAL LOW (ref >60)
GFR calc non Af Amer: 48 mL/min — ABNORMAL LOW (ref >60)
GLUCOSE: 104 mg/dL — AB (ref 70–99)
Potassium: 4.2 mmol/L (ref 3.5–5.1)
SODIUM: 137 mmol/L (ref 135–145)
Total Bilirubin: 0.6 mg/dL (ref 0.3–1.2)
Total Protein: 8.3 g/dL — ABNORMAL HIGH (ref 6.5–8.1)

## 2018-08-15 MED ORDER — SODIUM CHLORIDE 0.9 % IV SOLN
Freq: Once | INTRAVENOUS | Status: AC
  Administered 2018-08-15: 11:00:00 via INTRAVENOUS

## 2018-08-15 MED ORDER — OXYCODONE HCL 10 MG PO TABS: 10.0000 mg | ORAL_TABLET | Freq: Four times a day (QID) | ORAL | 0 refills | Status: DC | PRN

## 2018-08-15 MED ORDER — SODIUM CHLORIDE 0.9% FLUSH
10.0000 mL | INTRAVENOUS | Status: DC | PRN
  Administered 2018-08-15: 10 mL
  Filled 2018-08-15: qty 10

## 2018-08-15 MED ORDER — HEPARIN SOD (PORK) LOCK FLUSH 100 UNIT/ML IV SOLN
500.0000 [IU] | Freq: Once | INTRAVENOUS | Status: AC | PRN
  Administered 2018-08-15: 500 [IU]

## 2018-08-15 MED ORDER — SODIUM CHLORIDE 0.9 % IV SOLN
480.0000 mg | Freq: Once | INTRAVENOUS | Status: AC
  Administered 2018-08-15: 480 mg via INTRAVENOUS
  Filled 2018-08-15: qty 48

## 2018-08-15 MED ORDER — DRONABINOL 2.5 MG PO CAPS: 2.5000 mg | ORAL_CAPSULE | Freq: Two times a day (BID) | ORAL | 2 refills | Status: DC

## 2018-08-15 NOTE — Patient Instructions (Signed)
[facility] Discharge Instructions for Patients Receiving Chemotherapy  Today you received the following chemotherapy agents opdivo.    If you develop nausea and vomiting that is not controlled by your nausea medication, call the clinic.   BELOW ARE SYMPTOMS THAT SHOULD BE REPORTED IMMEDIATELY:  *FEVER GREATER THAN 100.5 F  *CHILLS WITH OR WITHOUT FEVER  NAUSEA AND VOMITING THAT IS NOT CONTROLLED WITH YOUR NAUSEA MEDICATION  *UNUSUAL SHORTNESS OF BREATH  *UNUSUAL BRUISING OR BLEEDING  TENDERNESS IN MOUTH AND THROAT WITH OR WITHOUT PRESENCE OF ULCERS  *URINARY PROBLEMS  *BOWEL PROBLEMS  UNUSUAL RASH Items with * indicate a potential emergency and should be followed up as soon as possible.  Feel free to call the clinic should you have any questions or concerns. The clinic phone number is [phone].  Please show the [facility] at check-in to the Emergency Department and triage nurse.

## 2018-08-15 NOTE — Progress Notes (Signed)
Patient is having gum/mouth surgery October 9th due to upper jaw bone coming through gums.  Currently doing Chlorahexadine gluconate mouth rinse and taking Amoxicillin 500mg  as directed by the oral surgeon.  Stop XGEVA verbal order Dr. Delton Coombes.

## 2018-08-20 ENCOUNTER — Other Ambulatory Visit (HOSPITAL_COMMUNITY): Payer: Self-pay | Admitting: *Deleted

## 2018-08-20 DIAGNOSIS — E032 Hypothyroidism due to medicaments and other exogenous substances: Secondary | ICD-10-CM

## 2018-08-21 ENCOUNTER — Telehealth (HOSPITAL_COMMUNITY): Payer: Self-pay | Admitting: Nurse Practitioner

## 2018-08-21 MED ORDER — LEVOTHYROXINE SODIUM 25 MCG PO TABS: ORAL_TABLET | ORAL | 0 refills | Status: DC

## 2018-08-21 NOTE — Telephone Encounter (Signed)
SUBMITTED PA REQ ON CMM FOR OXYCODONE

## 2018-08-30 DIAGNOSIS — E440 Moderate protein-calorie malnutrition: Secondary | ICD-10-CM | POA: Diagnosis not present

## 2018-08-30 DIAGNOSIS — R7301 Impaired fasting glucose: Secondary | ICD-10-CM | POA: Diagnosis not present

## 2018-08-30 DIAGNOSIS — D508 Other iron deficiency anemias: Secondary | ICD-10-CM | POA: Diagnosis not present

## 2018-08-30 DIAGNOSIS — N183 Chronic kidney disease, stage 3 (moderate): Secondary | ICD-10-CM | POA: Diagnosis not present

## 2018-08-30 DIAGNOSIS — I700 Atherosclerosis of aorta: Secondary | ICD-10-CM | POA: Diagnosis not present

## 2018-08-30 DIAGNOSIS — C3490 Malignant neoplasm of unspecified part of unspecified bronchus or lung: Secondary | ICD-10-CM | POA: Diagnosis not present

## 2018-08-30 DIAGNOSIS — E039 Hypothyroidism, unspecified: Secondary | ICD-10-CM | POA: Diagnosis not present

## 2018-08-30 DIAGNOSIS — E782 Mixed hyperlipidemia: Secondary | ICD-10-CM | POA: Diagnosis not present

## 2018-08-30 DIAGNOSIS — J45998 Other asthma: Secondary | ICD-10-CM | POA: Diagnosis not present

## 2018-08-30 DIAGNOSIS — Z681 Body mass index (BMI) 19 or less, adult: Secondary | ICD-10-CM | POA: Diagnosis not present

## 2018-09-03 DIAGNOSIS — M87180 Osteonecrosis due to drugs, jaw: Secondary | ICD-10-CM | POA: Diagnosis not present

## 2018-09-12 ENCOUNTER — Other Ambulatory Visit: Payer: Self-pay

## 2018-09-12 ENCOUNTER — Encounter (HOSPITAL_COMMUNITY): Payer: Self-pay

## 2018-09-12 ENCOUNTER — Inpatient Hospital Stay (HOSPITAL_COMMUNITY): Payer: Medicare HMO | Attending: Internal Medicine

## 2018-09-12 ENCOUNTER — Inpatient Hospital Stay (HOSPITAL_BASED_OUTPATIENT_CLINIC_OR_DEPARTMENT_OTHER): Payer: Medicare HMO | Admitting: Internal Medicine

## 2018-09-12 ENCOUNTER — Inpatient Hospital Stay (HOSPITAL_COMMUNITY): Payer: Medicare HMO

## 2018-09-12 VITALS — BP 109/63 | HR 85 | Temp 98.7°F | Resp 18 | Wt 98.2 lb

## 2018-09-12 DIAGNOSIS — C7951 Secondary malignant neoplasm of bone: Secondary | ICD-10-CM

## 2018-09-12 DIAGNOSIS — C3411 Malignant neoplasm of upper lobe, right bronchus or lung: Secondary | ICD-10-CM

## 2018-09-12 DIAGNOSIS — Z5112 Encounter for antineoplastic immunotherapy: Secondary | ICD-10-CM | POA: Diagnosis present

## 2018-09-12 DIAGNOSIS — I2510 Atherosclerotic heart disease of native coronary artery without angina pectoris: Secondary | ICD-10-CM | POA: Diagnosis not present

## 2018-09-12 DIAGNOSIS — E278 Other specified disorders of adrenal gland: Secondary | ICD-10-CM

## 2018-09-12 DIAGNOSIS — F17210 Nicotine dependence, cigarettes, uncomplicated: Secondary | ICD-10-CM | POA: Diagnosis not present

## 2018-09-12 DIAGNOSIS — C7971 Secondary malignant neoplasm of right adrenal gland: Secondary | ICD-10-CM | POA: Insufficient documentation

## 2018-09-12 DIAGNOSIS — N189 Chronic kidney disease, unspecified: Secondary | ICD-10-CM | POA: Insufficient documentation

## 2018-09-12 DIAGNOSIS — R69 Illness, unspecified: Secondary | ICD-10-CM | POA: Diagnosis not present

## 2018-09-12 DIAGNOSIS — E1122 Type 2 diabetes mellitus with diabetic chronic kidney disease: Secondary | ICD-10-CM

## 2018-09-12 DIAGNOSIS — I700 Atherosclerosis of aorta: Secondary | ICD-10-CM | POA: Diagnosis not present

## 2018-09-12 DIAGNOSIS — I129 Hypertensive chronic kidney disease with stage 1 through stage 4 chronic kidney disease, or unspecified chronic kidney disease: Secondary | ICD-10-CM | POA: Diagnosis not present

## 2018-09-12 LAB — CBC WITH DIFFERENTIAL/PLATELET
ABS IMMATURE GRANULOCYTES: 0.03 10*3/uL (ref 0.00–0.07)
BASOS PCT: 0 %
Basophils Absolute: 0 10*3/uL (ref 0.0–0.1)
EOS ABS: 0.1 10*3/uL (ref 0.0–0.5)
Eosinophils Relative: 1 %
HCT: 38.3 % (ref 36.0–46.0)
Hemoglobin: 11.9 g/dL — ABNORMAL LOW (ref 12.0–15.0)
IMMATURE GRANULOCYTES: 0 %
LYMPHS ABS: 2.7 10*3/uL (ref 0.7–4.0)
Lymphocytes Relative: 29 %
MCH: 29.2 pg (ref 26.0–34.0)
MCHC: 31.1 g/dL (ref 30.0–36.0)
MCV: 94.1 fL (ref 80.0–100.0)
MONO ABS: 0.7 10*3/uL (ref 0.1–1.0)
MONOS PCT: 8 %
NEUTROS ABS: 5.7 10*3/uL (ref 1.7–7.7)
NEUTROS PCT: 62 %
PLATELETS: 257 10*3/uL (ref 150–400)
RBC: 4.07 MIL/uL (ref 3.87–5.11)
RDW: 16.3 % — AB (ref 11.5–15.5)
WBC: 9.4 10*3/uL (ref 4.0–10.5)
nRBC: 0 % (ref 0.0–0.2)

## 2018-09-12 LAB — COMPREHENSIVE METABOLIC PANEL
ALBUMIN: 3.8 g/dL (ref 3.5–5.0)
ALT: 15 U/L (ref 0–44)
AST: 18 U/L (ref 15–41)
Alkaline Phosphatase: 66 U/L (ref 38–126)
Anion gap: 5 (ref 5–15)
BUN: 17 mg/dL (ref 8–23)
CHLORIDE: 108 mmol/L (ref 98–111)
CO2: 25 mmol/L (ref 22–32)
Calcium: 9.7 mg/dL (ref 8.9–10.3)
Creatinine, Ser: 1.14 mg/dL — ABNORMAL HIGH (ref 0.44–1.00)
GFR calc Af Amer: 58 mL/min — ABNORMAL LOW (ref >60)
GFR calc non Af Amer: 50 mL/min — ABNORMAL LOW (ref >60)
GLUCOSE: 113 mg/dL — AB (ref 70–99)
Potassium: 4.1 mmol/L (ref 3.5–5.1)
Sodium: 138 mmol/L (ref 135–145)
Total Bilirubin: 0.4 mg/dL (ref 0.3–1.2)
Total Protein: 7.6 g/dL (ref 6.5–8.1)

## 2018-09-12 LAB — LACTATE DEHYDROGENASE: LDH: 109 U/L (ref 98–192)

## 2018-09-12 LAB — TSH: TSH: 1.163 u[IU]/mL (ref 0.350–4.500)

## 2018-09-12 MED ORDER — DENOSUMAB 120 MG/1.7ML ~~LOC~~ SOLN
120.0000 mg | Freq: Once | SUBCUTANEOUS | Status: DC
  Filled 2018-09-12: qty 1.7

## 2018-09-12 MED ORDER — HEPARIN SOD (PORK) LOCK FLUSH 100 UNIT/ML IV SOLN
500.0000 [IU] | Freq: Once | INTRAVENOUS | Status: AC | PRN
  Administered 2018-09-12: 500 [IU]

## 2018-09-12 MED ORDER — SODIUM CHLORIDE 0.9 % IV SOLN
480.0000 mg | Freq: Once | INTRAVENOUS | Status: AC
  Administered 2018-09-12: 480 mg via INTRAVENOUS
  Filled 2018-09-12: qty 48

## 2018-09-12 MED ORDER — SODIUM CHLORIDE 0.9% FLUSH
10.0000 mL | INTRAVENOUS | Status: DC | PRN
  Administered 2018-09-12: 10 mL
  Filled 2018-09-12: qty 10

## 2018-09-12 MED ORDER — SODIUM CHLORIDE 0.9 % IV SOLN
Freq: Once | INTRAVENOUS | Status: AC
  Administered 2018-09-12: 11:00:00 via INTRAVENOUS

## 2018-09-12 NOTE — Progress Notes (Signed)
Diagnosis No diagnosis found.  Staging Cancer Staging No matching staging information was found for the patient.  Assessment and Plan:  Cancer of upper lobe of right lung 1.  Metastatic poorly differentiated adenocarcinoma of the lung to the bones and right adrenal: -Foundation 1 testing with 14 genomic alterations, no approved FDA therapies and the patient tumor type - On opdivo 480 mg monthly started on 02/25/2015 -CT scan of the chest, abdomen and pelvis dated 06/07/2018 which showed stable right upper lobe lung mass and right adrenal meta stasis. - She is tolerating Opdivo very well. Labs done 09/12/2018 reviewed and showed WBC 9.4 HB 11.9 Plts 257,000.  K+ 4.1 Cr 1.14 Normal LFTS.  Pt will be set up for imaging with CT CAP and will follow-up with Dr. Worthy Keeler to go over results.    2.  Osteonecrosis.  Pt has been seen by oral surgeon and reports she was diagnosed with osteonecrosis.  Will hold Xgeva.  Pt will follow-up with Dr. Worthy Keeler.    3.  CKD: Creatinine is 1.14 today.  Will repeat chemistries on RTC.    25 minutes spent with more than 50% spent in counseling and coordination of care.    Current Status:  Pt is seen today for follow-up prior to opdivo.  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     Cancer of upper lobe of right lung ([place])   07/28/2014 Imaging    CT chest: Large R apical mass consistent with malignancy. This is destroying the R 2nd rib with extension into adjacent soft tissue. R hilar adenopathy with R 5cm adrenal metastatic lesion.    08/01/2014 Initial Biopsy    Lung, needle/core biopsy(ies), right upper lobe - POORLY DIFFERENTIATED ADENOCARCINOMA, SEE COMMENT.    08/08/2014 PET scan    Large hypermetabolic R apical mass with evidence of direct chest wall and mediastinal invasion, right retrocrural lymphadenopathy, extensive retroperitoneal lymphadenopathy, and metastatic lesions to the adrenal glands     09/02/2014 - 11/04/2014 Chemotherapy     Cisplatin/Pemetrexed/Avastin every 21 days x 4 cycles    10/07/2014 - 10/27/2014 Radiation Therapy    Right lung apex for control of brachioplexopathy.    12/24/2014 - 02/25/2015 Chemotherapy    Alimta/Avastin every 21 days.    02/20/2015 Imaging    Increase in size of right adrenal metastasis and subjacent confluent retrocaval lymphadenopathy    02/25/2015 -  Chemotherapy    Nivolumab, zometa    05/04/2015 Imaging    CT CAP- Stable to slight decrease in the posterior right apical lesion. Stable appearance of posterior right upper rib an upper thoracic bony lesions. Slight improvement in right upper lobe tree-in-bud opacity. No new or progressive findings in...    07/28/2015 Imaging    CT CAP- Reduced size of the right apical pleural parenchymal lesion and reduced size of the right adrenal metastatic lesion. Resolution of prior retrocrural adenopathy.  Right eccentric T1 and T2 sclerosis with sclerosis and tapering of the right second..    11/17/2015 Imaging    CT CAP- Stable soft tissue thickening in the apex of the right hemi thorax. Stable right adrenal metastasis. Nodularity along the trachea and mainstem bronchi, relatively new from 07/28/2015, favoring adherent debris.    11/18/2015 Treatment Plan Change    Zometa HELD for upcoming tooth extraction    11/24/2015 Treatment Plan Change    Zometa on hold at this time in preparation for tooth extraction in March 2017.  Zometa las given on 11/18/2015.  02/03/2016 Imaging    CT CAP- Heterogeneous right apical masslike consolidation and right adrenal metastasis are unchanged    04/06/2016 Treatment Plan Change    Zometa restarted 6 weeks out from tooth extraction (04/06/2016)    05/12/2016 Imaging    CT CAP- NED in the chest, abdomen or pelvis.  Some areas of nodularity associated with the mainstem bronchi in the left upper lobe bronchus, favored to represent adherent inspissated secretions    08/17/2016 Imaging    CT CAP- 1. Stable CTs of  the chest and abdomen. No evidence of progressive metastatic disease. 2. Probable treated tumor at the right apex, right adrenal gland and T2 vertebral body, stable. 3. Fluctuating nodularity along the walls of the trachea and mainstem bronchi, likely secretions.    12/12/2016 Imaging    Further decrease in size of treated tumor within the right apex. 2. Stable treated tumor involving the right second rib and T2 vertebra. 3. Stable right adrenal gland treated tumor. 4. Emphysema 5. Aortic atherosclerosis    01/04/2017 Imaging    MRI brain- Normal brain MRI.  No intracranial metastatic disease.    03/15/2017 Imaging    CT CAP- 1. No new or progressive metastatic disease in the chest or abdomen. 2. Stable treated tumor in the apical right upper lobe. Stable treated right posterior second rib and right T2 vertebral lesions. Stable treated right adrenal metastasis. 3. Aortic atherosclerosis. 4. Moderate emphysema with mild diffuse bronchial wall thickening, suggesting COPD.    06/12/2017 Imaging    CT CAP 1. Similar appearance of treated primary within the right apex. 2. Similar areas of sclerosis within the right second rib, T2, and less so T1 vertebral bodies. These are most consistent with treated metastasis. 3. Similar right adrenal treated metastasis. 4. No evidence of new or progressive disease. 5. Similar right and progressive left areas of bronchial wall thickening and probable mucoid impaction. Correlate with interval infectious symptoms. 6.  Emphysema (ICD10-J43.9). 7. Coronary artery atherosclerosis. Aortic Atherosclerosis (ICD10-I70.0).     10/16/2017 Imaging    CT CAP 1. Stable appearance of the prior Pancoast tumor and related bony findings in the right second rib and right T1 and T2 vertebra compatible with successfully treated tumor. No significant enlargement or new lesions are identified. Similarly the treated right adrenal metastatic lesion is stable in  appearance. 2. Upper normal size right hilar lymph node may warrant surveillance. Currently 9 mm in short axis. 3. Other imaging findings of potential clinical significance: Aortic Atherosclerosis (ICD10-I70.0) and Emphysema (ICD10-J43.9). Scattered proximal sigmoid colon diverticula. Mucus plugging medially in the left upper lobe and posteriorly in the right upper lobe.       Problem List Patient Active Problem List   Diagnosis Date Noted  . Hypothyroidism due to medication [E03.2] 01/30/2017  . Portacath in place [facility] 06/30/2016  . Tobacco use [Z72.0] 06/30/2016  . Bone metastases ([place]) [C79.51] 04/05/2016  . Encounter for screening colonoscopy [Z12.11] 03/06/2015  . Dysphagia [R13.10] 03/04/2015  . Anemia in neoplastic disease [D63.0] 01/14/2015  . Hypercalcemia [E83.52] 12/24/2014  . Oropharyngeal dysphagia [R13.12] 12/05/2014  . Nausea and vomiting [R11.2] 12/05/2014  . ARF (acute renal failure) ([place]) [N17.9] 12/04/2014  . Abnormal finding on imaging [R93.89] 12/04/2014  . Cancer of upper lobe of right lung ([place]) [C34.11] 08/14/2014  . Adrenal mass, right ([place]) [E27.8] 07/28/2014    Past Medical History Past Medical History:  Diagnosis Date  . Adrenal mass, right ([place]) 07/28/2014  . Anemia   . Bone  metastases ([place]) 04/05/2016  . Cancer ([place])    lung  right  . Diabetes mellitus without complication ([place])   . GERD (gastroesophageal reflux disease)   . Hyperlipidemia   . Hypertension   . Hypothyroidism due to medication 01/30/2017  . Lung mass 07/28/2014  . Reflux     Past Surgical History Past Surgical History:  Procedure Laterality Date  . AMPUTATION Right 02/22/2017   Procedure: PARTIAL AMPUTATION RIGHT GREAT TOE;  Surgeon: Caprice Beaver, DPM;  Location: [facility] ORS;  Service: Podiatry;  Laterality: Right;  . APPENDECTOMY    . ESOPHAGOGASTRODUODENOSCOPY N/A 12/09/2014   TKZ:[id number] esophageal stricture/mild-to-noderate erosive gastritis. negative H.pylori  .  FLEXIBLE SIGMOIDOSCOPY  2011   Dr. [name]: hyperplastic polyp  . LUNG BIOPSY Right 07/2014   CT guided  . MALONEY DILATION N/A 12/09/2014   Procedure: [name] DILATION;  Surgeon: [name], MD;  Location: [facility] END[facility] SU[facility];  Service: Endoscopy;  Laterality: N/A;  . PORTACATH PLACEMENT Left 09/01/2014  . SAVORY DILATION N/A 12/09/2014   Procedure: SAVORY DILATION;  Surgeon: [name], MD;  Location: [facility] SU[facility];  Service: Endoscopy;  Laterality: N/A;    Family History Family History  Problem Relation Age of Onset  . Cancer Sister   . Colon cancer Neg Hx      Social History  reports that she has been smoking cigarettes. She has a 6.00 pack-year smoking history. She has never used smokeless tobacco. She reports that she does not drink alcohol or use drugs.  Medications  Current Outpatient Medications:  .  amoxicillin (AMOXIL) 500 MG capsule, , Disp: , Rfl:  .  Calcium Carb-Cholecalciferol (CALCIUM 1000 + D PO), Take 1 tablet by mouth daily., Disp: , Rfl:  .  chlorhexidine (PERIDEX) 0.12 % solution, , Disp: , Rfl:  .  denosumab (XGEVA) 120 MG/1.7ML SOLN injection, Inject 120 mg into the skin every 28 (twenty-eight) days., Disp: , Rfl:  .  dronabinol (MARINOL) 2.5 MG capsule, Take 1 capsule (2.5 mg total) by mouth 2 (two) times daily before lunch and supper., Disp: 60 capsule, Rfl: 2 .  ENSURE (ENSURE), Take 1 Can by mouth 4 (four) times daily., Disp: , Rfl:  .  Fluticasone-Salmeterol (ADVAIR DISKUS) 500-50 MCG/DOSE AEPB, Inhale 1 puff into the lungs 2 (two) times daily. (Patient taking differently: Inhale 1 puff into the lungs 2 (two) times daily as needed (for respiratory issues.). ), Disp: 60 each, Rfl: 6 .  ibuprofen (ADVIL,MOTRIN) 200 MG tablet, Take 400 mg by mouth every 8 (eight) hours as needed (for pain.)., Disp: , Rfl:  .  levothyroxine (SYNTHROID, LEVOTHROID) 25 MCG tablet, TAKE 1/2 (ONE-HALF) TABLET BY MOUTH ONCE DAILY BEFORE BREAKFAST, Disp: 45 tablet, Rfl: 0 .   lidocaine-prilocaine (EMLA) cream, APPLY A QUARTER SIZE AMOUNT TO PORT SITE 1 HOUR PRIOR TO CHEMO.  DO NOT RUB IN.  COVER WITH PLASTIC WRAP, Disp: 30 g, Rfl: 5 .  magic mouthwash SOLN, Take 5 mLs by mouth 4 (four) times daily as needed for mouth pain., Disp: 480 mL, Rfl: 2 .  Nivolumab (OPDIVO IV), Inject into the vein every 28 (twenty-eight) days., Disp: , Rfl:  .  omeprazole (PRILOSEC) 10 MG capsule, , Disp: , Rfl:  .  omeprazole (PRILOSEC) 40 MG capsule, TAKE 1 CAPSULE BY MOUTH ONCE DAILY, Disp: 90 capsule, Rfl: 3 .  ondansetron (ZOFRAN) 8 MG tablet, Take 1 tablet (8 mg total) by mouth every 8 (eight) hours as needed for nausea or vomiting., Disp: 60 tablet,  Rfl: 1 .  Oxycodone HCl 10 MG TABS, Take 1-2 tablets (10-20 mg total) by mouth every 6 (six) hours as needed (pain.)., Disp: 100 tablet, Rfl: 0 .  simvastatin (ZOCOR) 40 MG tablet, TAKE ONE TABLET BY MOUTH ONCE DAILY IN THE MORNING, Disp: 30 tablet, Rfl: 6 .  simvastatin (ZOCOR) 5 MG tablet, , Disp: , Rfl:  No current facility-administered medications for this visit.   Facility-Administered Medications Ordered in Other Visits:  .  denosumab (XGEVA) injection 120 mg, 120 mg, Subcutaneous, Once, Munirah Doerner, MD .  sodium chloride flush (NS) 0.9 % injection 10 mL, 10 mL, Intracatheter, PRN, Georgann Bramble, MD, 10 mL at 09/12/18 1218  Allergies Xgeva [denosumab]  Review of Systems Review of Systems - Oncology ROS negative   Physical Exam  Vitals Wt Readings from Last 3 Encounters:  09/12/18 98 lb 3.2 oz (44.5 kg)  08/15/18 95 lb 9.6 oz (43.4 kg)  07/18/18 94 lb 3.2 oz (42.7 kg)   Temp Readings from Last 3 Encounters:  09/12/18 98.7 F (37.1 C) (Oral)  08/15/18 98.5 F (36.9 C) (Oral)  07/18/18 98.4 F (36.9 C) (Oral)   BP Readings from Last 3 Encounters:  09/12/18 109/63  08/15/18 (!) 120/58  07/18/18 113/61   Pulse Readings from Last 3 Encounters:  09/12/18 85  08/15/18 78  07/18/18 75   Constitutional:  Well-developed, well-nourished, and in no distress.   HENT: Head: Normocephalic and atraumatic.  Mouth/Throat: No oropharyngeal exudate. Mucosa moist. Eyes: Pupils are equal, round, and reactive to light. Conjunctivae are normal. No scleral icterus.  Neck: Normal range of motion. Neck supple. No JVD present.  Cardiovascular: Normal rate, regular rhythm and normal heart sounds.  Exam reveals no gallop and no friction rub.   No murmur heard. Pulmonary/Chest: Effort normal and breath sounds normal. No respiratory distress. No wheezes.No rales.  Abdominal: Soft. Bowel sounds are normal. No distension. There is no tenderness. There is no guarding.  Musculoskeletal: No edema or tenderness.  Lymphadenopathy: No cervical, axillary or supraclavicular adenopathy.  Neurological: Alert and oriented to person, place, and time. No cranial nerve deficit.  Skin: Skin is warm and dry. No rash noted. No erythema. No pallor.  Psychiatric: Affect and judgment normal.   Labs Appointment on 09/12/2018  Component Date Value Ref Range Status  . TSH 09/12/2018 1.163  0.350 - 4.500 uIU/mL Final   Comment: Performed by a 3rd Generation assay with a functional sensitivity of <=0.01 uIU/mL. Performed at [facility], [address], [place], [place] [address]   . LDH 09/12/2018 109  98 - 192 U/L Final   Performed at [facility], [address], [place], [place] [address]  . WBC 09/12/2018 9.4  4.0 - 10.5 K/uL Final  . RBC 09/12/2018 4.07  3.87 - 5.11 MIL/uL Final  . Hemoglobin 09/12/2018 11.9* 12.0 - 15.0 g/dL Final  . HCT 09/12/2018 38.3  36.0 - 46.0 % Final  . MCV 09/12/2018 94.1  80.0 - 100.0 fL Final  . MCH 09/12/2018 29.2  26.0 - 34.0 pg Final  . MCHC 09/12/2018 31.1  30.0 - 36.0 g/dL Final  . RDW 09/12/2018 16.3* 11.5 - 15.5 % Final  . Platelets 09/12/2018 257  150 - 400 K/uL Final  . nRBC 09/12/2018 0.0  0.0 - 0.2 % Final  . Neutrophils Relative % 09/12/2018 62  % Final  . Neutro Abs 09/12/2018 5.7  1.7  - 7.7 K/uL Final  . Lymphocytes Relative 09/12/2018 29  % Final  .  Lymphs Abs 09/12/2018 2.7  0.7 - 4.0 K/uL Final  . Monocytes Relative 09/12/2018 8  % Final  . Monocytes Absolute 09/12/2018 0.7  0.1 - 1.0 K/uL Final  . Eosinophils Relative 09/12/2018 1  % Final  . Eosinophils Absolute 09/12/2018 0.1  0.0 - 0.5 K/uL Final  . Basophils Relative 09/12/2018 0  % Final  . Basophils Absolute 09/12/2018 0.0  0.0 - 0.1 K/uL Final  . Immature Granulocytes 09/12/2018 0  % Final  . Abs Immature Granulocytes 09/12/2018 0.03  0.00 - 0.07 K/uL Final   Performed at [facility], [address], [place], [place] [address]  . Sodium 09/12/2018 138  135 - 145 mmol/L Final  . Potassium 09/12/2018 4.1  3.5 - 5.1 mmol/L Final  . Chloride 09/12/2018 108  98 - 111 mmol/L Final  . CO2 09/12/2018 25  22 - 32 mmol/L Final  . Glucose, Bld 09/12/2018 113* 70 - 99 mg/dL Final  . BUN 09/12/2018 17  8 - 23 mg/dL Final  . Creatinine, Ser 09/12/2018 1.14* 0.44 - 1.00 mg/dL Final  . Calcium 09/12/2018 9.7  8.9 - 10.3 mg/dL Final  . Total Protein 09/12/2018 7.6  6.5 - 8.1 g/dL Final  . Albumin 09/12/2018 3.8  3.5 - 5.0 g/dL Final  . AST 09/12/2018 18  15 - 41 U/L Final  . ALT 09/12/2018 15  0 - 44 U/L Final  . Alkaline Phosphatase 09/12/2018 66  38 - 126 U/L Final  . Total Bilirubin 09/12/2018 0.4  0.3 - 1.2 mg/dL Final  . GFR calc non Af Amer 09/12/2018 50* >60 mL/min Final  . GFR calc Af Amer 09/12/2018 58* >60 mL/min Final   Comment: (NOTE) The eGFR has been calculated using the CKD EPI equation. This calculation has not been validated in all clinical situations. eGFR's persistently <60 mL/min signify possible Chronic Kidney Disease.   Georgiann Hahn gap 09/12/2018 5  5 - 15 Final   Performed at [facility], [address], [place], [place] [address]     Pathology No orders of the defined types were placed in this encounter.      Zoila Shutter MD

## 2018-09-12 NOTE — Patient Instructions (Signed)
[facility] Discharge Instructions for Patients Receiving Chemotherapy  Today you received the following chemotherapy agents   To help prevent nausea and vomiting after your treatment, we encourage you to take your nausea medication   If you develop nausea and vomiting that is not controlled by your nausea medication, call the clinic.   BELOW ARE SYMPTOMS THAT SHOULD BE REPORTED IMMEDIATELY:  *FEVER GREATER THAN 100.5 F  *CHILLS WITH OR WITHOUT FEVER  NAUSEA AND VOMITING THAT IS NOT CONTROLLED WITH YOUR NAUSEA MEDICATION  *UNUSUAL SHORTNESS OF BREATH  *UNUSUAL BRUISING OR BLEEDING  TENDERNESS IN MOUTH AND THROAT WITH OR WITHOUT PRESENCE OF ULCERS  *URINARY PROBLEMS  *BOWEL PROBLEMS  UNUSUAL RASH Items with * indicate a potential emergency and should be followed up as soon as possible.  Feel free to call the clinic should you have any questions or concerns. The clinic phone number is [phone].  Please show the CHEMO ALERT CARD at check-in to the Emergency Department and triage nurse.   

## 2018-09-12 NOTE — Progress Notes (Signed)
Pt seen by Dr. Walden Field today. Hold XGeva due to dentist request. Per Dr. Walden Field will hold today. Labs reviewed. Proceed with treatment per MD.   Treatment given today per MD orders. Tolerated infusion without adverse affects. Vital signs stable. No complaints at this time. Discharged from clinic ambulatory. F/U with [facility] as scheduled.

## 2018-09-12 NOTE — Progress Notes (Signed)
Will hold xgeva today per Dr. Walden Field. Patient had oral surgery-"scrapping some bone away on her gum". Oral surgeon stated she has osteonecrosis of the bone in her mouth. He stated she should not continue to take xgeva. Dr. Walden Field notified of all this info.

## 2018-10-03 ENCOUNTER — Ambulatory Visit: Payer: Medicare HMO | Admitting: Gastroenterology

## 2018-10-03 ENCOUNTER — Encounter: Payer: Self-pay | Admitting: Gastroenterology

## 2018-10-03 DIAGNOSIS — R1319 Other dysphagia: Secondary | ICD-10-CM

## 2018-10-03 DIAGNOSIS — R1310 Dysphagia, unspecified: Secondary | ICD-10-CM

## 2018-10-03 DIAGNOSIS — K219 Gastro-esophageal reflux disease without esophagitis: Secondary | ICD-10-CM

## 2018-10-03 DIAGNOSIS — Z1211 Encounter for screening for malignant neoplasm of colon: Secondary | ICD-10-CM

## 2018-10-03 MED ORDER — OMEPRAZOLE 40 MG PO CPDR: 40.0000 mg | DELAYED_RELEASE_CAPSULE | Freq: Every day | ORAL | 3 refills | Status: DC

## 2018-10-03 NOTE — Progress Notes (Signed)
Subjective:    Patient ID: Melissa Sullivan, female    DOB: [birth year], 62 y.o.   MRN: [id number]  [name], MD  HPI FEELING PRETTY GOOD. SWALLOWING GOOD. BMs: DAILY. WORKING ON IT NOW. WANTS TO GO SEE AUNT WHO LIVES ON [place].   PT DENIES FEVER, CHILLS, HEMATOCHEZIA, HEMATEMESIS, nausea, vomiting, melena, diarrhea, CHEST PAIN, SHORTNESS OF BREATH, CHANGE IN BOWEL IN HABITS, constipation, abdominal pain, problems swallowing, OR heartburn or indigestion.  Past Medical History:  Diagnosis Date  . Adrenal mass, right ([place]) 07/28/2014  . Anemia   . Bone metastases ([place]) 04/05/2016  . Cancer ([place])    lung  right  . Diabetes mellitus without complication ([place])   . GERD (gastroesophageal reflux disease)   . Hyperlipidemia   . Hypertension   . Hypothyroidism due to medication 01/30/2017  . Lung mass 07/28/2014  . Reflux    Past Surgical History:  Procedure Laterality Date  . AMPUTATION Right 02/22/2017   Procedure: PARTIAL AMPUTATION RIGHT GREAT TOE;  Surgeon: Caprice Beaver, DPM;  Location: [facility] ORS;  Service: Podiatry;  Laterality: Right;  . APPENDECTOMY    . ESOPHAGOGASTRODUODENOSCOPY N/A 12/09/2014   WCB:JSEGBT esophageal stricture/mild-to-noderate erosive gastritis. negative H.pylori  . FLEXIBLE SIGMOIDOSCOPY  2011   Dr. [name]: hyperplastic polyp  . LUNG BIOPSY Right 07/2014   CT guided  . MALONEY DILATION N/A 12/09/2014   Procedure: [name] DILATION;  Surgeon: [name], MD;  Location: [facility];  Service: Endoscopy;  Laterality: N/A;  . PORTACATH PLACEMENT Left 09/01/2014  . SAVORY DILATION N/A 12/09/2014   Procedure: SAVORY DILATION;  Surgeon: [name], MD;  Location: [facility];  Service: Endoscopy;  Laterality: N/A;   Allergies  Allergen Reactions  . Delton See [Denosumab]     osteonecrosis   Current Outpatient Medications  Medication Sig    . amoxicillin (AMOXIL) 500 MG capsule     . Calcium Carb-Cholecalciferol (CALCIUM 1000 + D PO) Take 1 tablet  by mouth daily.    . chlorhexidine (PERIDEX) 0.12 % solution     . denosumab (XGEVA) 120 MG/1.7ML SOLN injection Inject 120 mg into the skin every 28 (twenty-eight) days.    Marland Kitchen dronabinol (MARINOL) 2.5 MG capsule Take 1 capsule (2.5 mg total) by mouth 2 (two) times daily before lunch and supper.    Marland Kitchen ENSURE (ENSURE) Take 1 Can by mouth 4 (four) times daily.    . Fluticasone-Salmeterol (ADVAIR DISKUS) 500-50 MCG/DOSE AEPB Inhale 1 puff into the lungs 2 (two) times daily. (Patient taking differently: Inhale 1 puff into the lungs 2 (two) times daily as needed (for respiratory issues.). )    . ibuprofen (ADVIL,MOTRIN) 200 MG tablet Take 400 mg by mouth every 8 (eight) hours as needed (for pain.).    Marland Kitchen levothyroxine (SYNTHROID, LEVOTHROID) 25 MCG tablet TAKE 1/2 (ONE-HALF) TABLET BY MOUTH ONCE DAILY BEFORE BREAKFAST    . lidocaine-prilocaine (EMLA) cream APPLY A QUARTER SIZE AMOUNT TO PORT SITE 1 HOUR PRIOR TO CHEMO.  DO NOT RUB IN.  COVER WITH PLASTIC WRAP    . magic mouthwash SOLN Take 5 mLs by mouth 4 (four) times daily as needed for mouth pain.    . Nivolumab (OPDIVO IV) Inject into the vein every 28 (twenty-eight) days.    Marland Kitchen omeprazole (PRILOSEC) 10 MG capsule     . omeprazole (PRILOSEC) 40 MG capsule TAKE 1 CAPSULE BY MOUTH ONCE DAILY    . ondansetron (ZOFRAN) 8 MG tablet Take 1 tablet (  8 mg total) by mouth every 8 (eight) hours as needed for nausea or vomiting.    . Oxycodone HCl 10 MG TABS Take 1-2 tablets (10-20 mg total) by mouth every 6 (six) hours as needed (pain.).    Marland Kitchen simvastatin (ZOCOR) 40 MG tablet TAKE ONE TABLET BY MOUTH ONCE DAILY IN THE MORNING    .       Review of Systems PER HPI OTHERWISE ALL SYSTEMS ARE NEGATIVE.     Objective:   Physical Exam  Constitutional: She is oriented to person, place, and time. She appears well-developed and well-nourished. No distress.  HENT:  Head: Normocephalic and atraumatic.  Mouth/Throat: Oropharynx is clear and moist. No oropharyngeal  exudate.  Eyes: Pupils are equal, round, and reactive to light. No scleral icterus.  Neck: Normal range of motion. Neck supple.  Cardiovascular: Normal rate, regular rhythm and normal heart sounds.  Pulmonary/Chest: Effort normal and breath sounds normal. No respiratory distress.  FAIR AIR MOVEMENT IN ANTERIOR LUNG Yuri Flener BILATERALLY  Abdominal: Soft. Bowel sounds are normal. She exhibits no distension. There is no tenderness.  Musculoskeletal: She exhibits no edema.  Lymphadenopathy:    She has no cervical adenopathy.  Neurological: She is alert and oriented to person, place, and time.  Psychiatric: She has a normal mood and affect.  Vitals reviewed.     Assessment & Plan:

## 2018-10-03 NOTE — Assessment & Plan Note (Signed)
SYMPTOMS CONTROLLED/RESOLVED.  CONTINUE OMEPRAZOLE.  TAKE 30 MINUTES PRIOR TO YOUR FIRST MEAL. DRINK ENSURE FOUR TIMES A DAY. FOLLOW UP IN 1 YEAR.

## 2018-10-03 NOTE — Assessment & Plan Note (Signed)
HYPERPLASTIC POLYP REMOVED IN [place] 2011. AVERAGE RISK AND NOW WITH STAGE IV LUNG CA  CONSIDER BENEFITS V. RISKS AND LONG TERM PROGNOSIS IN DEC 2021.

## 2018-10-03 NOTE — Assessment & Plan Note (Signed)
SYMPTOMS CONTROLLED/RESOLVED.  CONTINUE OMEPRAZOLE.  TAKE 30 MINUTES PRIOR TO YOUR FIRST MEAL. REFILL x 1 YR. DRINK ENSURE FOUR TIMES A DAY. FOLLOW UP IN 1 YEAR.

## 2018-10-03 NOTE — Patient Instructions (Addendum)
CONTINUE OMEPRAZOLE.  TAKE 30 MINUTES PRIOR TO YOUR FIRST MEAL.  DRINK ENSURE FOUR TIMES A DAY.  FOLLOW UP IN 1 YEAR.

## 2018-10-04 ENCOUNTER — Ambulatory Visit (HOSPITAL_COMMUNITY)
Admission: RE | Admit: 2018-10-04 | Discharge: 2018-10-04 | Disposition: A | Discharge status: Home / Self Care | Payer: Medicare HMO | Source: Ambulatory Visit | Attending: Nurse Practitioner | Admitting: Nurse Practitioner

## 2018-10-04 DIAGNOSIS — E279 Disorder of adrenal gland, unspecified: Secondary | ICD-10-CM | POA: Insufficient documentation

## 2018-10-04 DIAGNOSIS — C3411 Malignant neoplasm of upper lobe, right bronchus or lung: Secondary | ICD-10-CM | POA: Diagnosis present

## 2018-10-04 DIAGNOSIS — M899 Disorder of bone, unspecified: Secondary | ICD-10-CM | POA: Diagnosis not present

## 2018-10-04 DIAGNOSIS — C7951 Secondary malignant neoplasm of bone: Secondary | ICD-10-CM | POA: Diagnosis not present

## 2018-10-04 DIAGNOSIS — R918 Other nonspecific abnormal finding of lung field: Secondary | ICD-10-CM | POA: Diagnosis not present

## 2018-10-04 DIAGNOSIS — C7970 Secondary malignant neoplasm of unspecified adrenal gland: Secondary | ICD-10-CM | POA: Diagnosis not present

## 2018-10-04 DIAGNOSIS — C3491 Malignant neoplasm of unspecified part of right bronchus or lung: Secondary | ICD-10-CM | POA: Diagnosis not present

## 2018-10-04 MED ORDER — IOHEXOL 300 MG/ML  SOLN
75.0000 mL | Freq: Once | INTRAMUSCULAR | Status: AC | PRN
  Administered 2018-10-04: 75 mL via INTRAVENOUS

## 2018-10-11 ENCOUNTER — Encounter (HOSPITAL_COMMUNITY): Payer: Self-pay

## 2018-10-11 ENCOUNTER — Encounter (HOSPITAL_COMMUNITY): Payer: Self-pay | Admitting: Hematology

## 2018-10-11 ENCOUNTER — Inpatient Hospital Stay (HOSPITAL_COMMUNITY): Payer: Medicare HMO

## 2018-10-11 ENCOUNTER — Inpatient Hospital Stay (HOSPITAL_COMMUNITY): Payer: Medicare HMO | Attending: Hematology

## 2018-10-11 ENCOUNTER — Inpatient Hospital Stay (HOSPITAL_BASED_OUTPATIENT_CLINIC_OR_DEPARTMENT_OTHER): Payer: Medicare HMO | Admitting: Hematology

## 2018-10-11 VITALS — BP 125/63 | HR 80 | Temp 98.3°F | Resp 18

## 2018-10-11 DIAGNOSIS — I129 Hypertensive chronic kidney disease with stage 1 through stage 4 chronic kidney disease, or unspecified chronic kidney disease: Secondary | ICD-10-CM | POA: Diagnosis not present

## 2018-10-11 DIAGNOSIS — C7951 Secondary malignant neoplasm of bone: Secondary | ICD-10-CM

## 2018-10-11 DIAGNOSIS — C3411 Malignant neoplasm of upper lobe, right bronchus or lung: Secondary | ICD-10-CM

## 2018-10-11 DIAGNOSIS — C7971 Secondary malignant neoplasm of right adrenal gland: Secondary | ICD-10-CM

## 2018-10-11 DIAGNOSIS — R69 Illness, unspecified: Secondary | ICD-10-CM | POA: Diagnosis not present

## 2018-10-11 DIAGNOSIS — E1122 Type 2 diabetes mellitus with diabetic chronic kidney disease: Secondary | ICD-10-CM

## 2018-10-11 DIAGNOSIS — F17210 Nicotine dependence, cigarettes, uncomplicated: Secondary | ICD-10-CM | POA: Insufficient documentation

## 2018-10-11 DIAGNOSIS — N189 Chronic kidney disease, unspecified: Secondary | ICD-10-CM | POA: Diagnosis not present

## 2018-10-11 DIAGNOSIS — Z79899 Other long term (current) drug therapy: Secondary | ICD-10-CM | POA: Insufficient documentation

## 2018-10-11 DIAGNOSIS — Z923 Personal history of irradiation: Secondary | ICD-10-CM

## 2018-10-11 DIAGNOSIS — Z5112 Encounter for antineoplastic immunotherapy: Secondary | ICD-10-CM | POA: Diagnosis present

## 2018-10-11 DIAGNOSIS — I700 Atherosclerosis of aorta: Secondary | ICD-10-CM | POA: Diagnosis not present

## 2018-10-11 DIAGNOSIS — R630 Anorexia: Secondary | ICD-10-CM | POA: Diagnosis not present

## 2018-10-11 DIAGNOSIS — E278 Other specified disorders of adrenal gland: Secondary | ICD-10-CM

## 2018-10-11 LAB — COMPREHENSIVE METABOLIC PANEL
ALT: 14 U/L (ref 0–44)
AST: 17 U/L (ref 15–41)
Albumin: 3.8 g/dL (ref 3.5–5.0)
Alkaline Phosphatase: 64 U/L (ref 38–126)
Anion gap: 7 (ref 5–15)
BILIRUBIN TOTAL: 0.5 mg/dL (ref 0.3–1.2)
BUN: 15 mg/dL (ref 8–23)
CALCIUM: 9.5 mg/dL (ref 8.9–10.3)
CO2: 24 mmol/L (ref 22–32)
CREATININE: 1.28 mg/dL — AB (ref 0.44–1.00)
Chloride: 107 mmol/L (ref 98–111)
GFR calc non Af Amer: 44 mL/min — ABNORMAL LOW (ref >60)
GFR, EST AFRICAN AMERICAN: 51 mL/min — AB (ref >60)
Glucose, Bld: 102 mg/dL — ABNORMAL HIGH (ref 70–99)
Potassium: 4 mmol/L (ref 3.5–5.1)
Sodium: 138 mmol/L (ref 135–145)
TOTAL PROTEIN: 7.7 g/dL (ref 6.5–8.1)

## 2018-10-11 LAB — CBC WITH DIFFERENTIAL/PLATELET
Abs Immature Granulocytes: 0.04 10*3/uL (ref 0.00–0.07)
Basophils Absolute: 0 10*3/uL (ref 0.0–0.1)
Basophils Relative: 0 %
EOS ABS: 0.1 10*3/uL (ref 0.0–0.5)
EOS PCT: 1 %
HEMATOCRIT: 38.4 % (ref 36.0–46.0)
Hemoglobin: 11.8 g/dL — ABNORMAL LOW (ref 12.0–15.0)
Immature Granulocytes: 1 %
LYMPHS ABS: 2.2 10*3/uL (ref 0.7–4.0)
Lymphocytes Relative: 29 %
MCH: 28.7 pg (ref 26.0–34.0)
MCHC: 30.7 g/dL (ref 30.0–36.0)
MCV: 93.4 fL (ref 80.0–100.0)
MONOS PCT: 8 %
Monocytes Absolute: 0.6 10*3/uL (ref 0.1–1.0)
NRBC: 0 % (ref 0.0–0.2)
Neutro Abs: 4.6 10*3/uL (ref 1.7–7.7)
Neutrophils Relative %: 61 %
Platelets: 262 10*3/uL (ref 150–400)
RBC: 4.11 MIL/uL (ref 3.87–5.11)
RDW: 16.1 % — AB (ref 11.5–15.5)
WBC: 7.7 10*3/uL (ref 4.0–10.5)

## 2018-10-11 MED ORDER — SODIUM CHLORIDE 0.9 % IV SOLN
480.0000 mg | Freq: Once | INTRAVENOUS | Status: AC
  Administered 2018-10-11: 480 mg via INTRAVENOUS
  Filled 2018-10-11: qty 48

## 2018-10-11 MED ORDER — HEPARIN SOD (PORK) LOCK FLUSH 100 UNIT/ML IV SOLN
500.0000 [IU] | Freq: Once | INTRAVENOUS | Status: AC | PRN
  Administered 2018-10-11: 500 [IU]

## 2018-10-11 MED ORDER — HEPARIN SOD (PORK) LOCK FLUSH 100 UNIT/ML IV SOLN
INTRAVENOUS | Status: AC
  Filled 2018-10-11: qty 5

## 2018-10-11 MED ORDER — SODIUM CHLORIDE 0.9 % IV SOLN
Freq: Once | INTRAVENOUS | Status: AC
  Administered 2018-10-11: 10:00:00 via INTRAVENOUS

## 2018-10-11 MED ORDER — DRONABINOL 2.5 MG PO CAPS: 2.5000 mg | ORAL_CAPSULE | Freq: Two times a day (BID) | ORAL | 2 refills | Status: DC

## 2018-10-11 MED ORDER — SODIUM CHLORIDE 0.9% FLUSH
10.0000 mL | INTRAVENOUS | Status: DC | PRN
  Administered 2018-10-11: 10 mL
  Filled 2018-10-11: qty 10

## 2018-10-11 NOTE — Patient Instructions (Signed)
[facility] at [facility] Discharge Instructions     Thank you for choosing [facility] at [facility] to provide your oncology and hematology care.  To afford each patient quality time with our provider, please arrive at least 15 minutes before your scheduled appointment time.   If you have a lab appointment with the Cancer Center please come in thru the  Main Entrance and check in at the main information desk  You need to re-schedule your appointment should you arrive 10 or more minutes late.  We strive to give you quality time with our providers, and arriving late affects you and other patients whose appointments are after yours.  Also, if you no show three or more times for appointments you may be dismissed from the clinic at the providers discretion.     Again, thank you for choosing [facility].  Our hope is that these requests will decrease the amount of time that you wait before being seen by our physicians.       _____________________________________________________________  Should you have questions after your visit to [name], please contact our office at [phone] between the hours of 8:00 a.m. and 4:30 p.m.  Voicemails left after 4:00 p.m. will not be returned until the following business day.  For prescription refill requests, have your pharmacy contact our office and allow 72 hours.    Cancer Center Support Programs:   > Cancer Support Group  2nd Tuesday of the month 1pm-2pm, Journey Room    

## 2018-10-11 NOTE — Assessment & Plan Note (Signed)
1.  Metastatic poorly differentiated adenocarcinoma of the lung to the bones and right adrenal: -Foundation 1 testing with 14 genomic alterations, no approved FDA therapies and the patient tumor type - On opdivo 480 mg monthly started on 02/25/2015 -CT CAP on 06/07/2018 showed stable right upper lobe lung mass and right adrenal meta stasis.  - Last treatment of Opdivo was on 09/17/2018.  She is tolerating very well.  No immune mediated side effects were elicited. - We discussed the results of the CT scan dated 10/04/2018 which shows stable pleural parenchymal thickening of the right lung apex.  Stable right adrenal gland noted. - I have reviewed her labs.  She may proceed with next cycle of Opdivo.  We will reevaluate her in 2 months.  I plan to repeat CT scan in 3 months.  2.  CKD: Creatinine today is 1.28.  Her baseline is around 1.2-1.3.   3.  Right shoulder pain and pain in the back of the legs: She takes oxycodone 10 mg as needed.  She is not requiring on a regular basis.  4.  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 - She is using chlorhexidine mouthwash.  She has a follow-up appointment with her oral surgeon.  We will hold her denosumab indefinitely.  5.  Weight loss: She is taking Marinol 2.5 mg twice daily.  She is drinking can of Ensure/boost daily.  Weight is more or less stable.

## 2018-10-11 NOTE — Progress Notes (Signed)
[address] [place], [place] [address]   CLINIC:  Medical Oncology/Hematology  PCP:  Melissa Squibb, MD [address] [place] [address] [phone]   REASON FOR VISIT: Follow-up for Metastatic poorly differentiated adenocarcinoma of the lung to the bones and right adrenal  CURRENT THERAPY: Opidvo every 4 weeks  BRIEF ONCOLOGIC HISTORY:    Cancer of upper lobe of right lung ([place])   07/28/2014 Imaging    CT chest: Large R apical mass consistent with malignancy. This is destroying the R 2nd rib with extension into adjacent soft tissue. R hilar adenopathy with R 5cm adrenal metastatic lesion.    08/01/2014 Initial Biopsy    Lung, needle/core biopsy(ies), right upper lobe - POORLY DIFFERENTIATED ADENOCARCINOMA, SEE COMMENT.    08/08/2014 PET scan    Large hypermetabolic R apical mass with evidence of direct chest wall and mediastinal invasion, right retrocrural lymphadenopathy, extensive retroperitoneal lymphadenopathy, and metastatic lesions to the adrenal glands     09/02/2014 - 11/04/2014 Chemotherapy    Cisplatin/Pemetrexed/Avastin every 21 days x 4 cycles    10/07/2014 - 10/27/2014 Radiation Therapy    Right lung apex for control of brachioplexopathy.    12/24/2014 - 02/25/2015 Chemotherapy    Alimta/Avastin every 21 days.    02/20/2015 Imaging    Increase in size of right adrenal metastasis and subjacent confluent retrocaval lymphadenopathy    02/25/2015 -  Chemotherapy    Nivolumab, zometa    05/04/2015 Imaging    CT CAP- Stable to slight decrease in the posterior right apical lesion. Stable appearance of posterior right upper rib an upper thoracic bony lesions. Slight improvement in right upper lobe tree-in-bud opacity. No new or progressive findings in...    07/28/2015 Imaging    CT CAP- Reduced size of the right apical pleural parenchymal lesion and reduced size of the right adrenal metastatic lesion. Resolution of prior retrocrural adenopathy.   Right eccentric T1 and T2 sclerosis with sclerosis and tapering of the right second..    11/17/2015 Imaging    CT CAP- Stable soft tissue thickening in the apex of the right hemi thorax. Stable right adrenal metastasis. Nodularity along the trachea and mainstem bronchi, relatively new from 07/28/2015, favoring adherent debris.    11/18/2015 Treatment Plan Change    Zometa HELD for upcoming tooth extraction    11/24/2015 Treatment Plan Change    Zometa on hold at this time in preparation for tooth extraction in March 2017.  Zometa las given on 11/18/2015.    02/03/2016 Imaging    CT CAP- Heterogeneous right apical masslike consolidation and right adrenal metastasis are unchanged    04/06/2016 Treatment Plan Change    Zometa restarted 6 weeks out from tooth extraction (04/06/2016)    05/12/2016 Imaging    CT CAP- NED in the chest, abdomen or pelvis.  Some areas of nodularity associated with the mainstem bronchi in the left upper lobe bronchus, favored to represent adherent inspissated secretions    08/17/2016 Imaging    CT CAP- 1. Stable CTs of the chest and abdomen. No evidence of progressive metastatic disease. 2. Probable treated tumor at the right apex, right adrenal gland and T2 vertebral body, stable. 3. Fluctuating nodularity along the walls of the trachea and mainstem bronchi, likely secretions.    12/12/2016 Imaging    Further decrease in size of treated tumor within the right apex. 2. Stable treated tumor involving the right second rib and T2 vertebra. 3. Stable  right adrenal gland treated tumor. 4. Emphysema 5. Aortic atherosclerosis    01/04/2017 Imaging    MRI brain- Normal brain MRI.  No intracranial metastatic disease.    03/15/2017 Imaging    CT CAP- 1. No new or progressive metastatic disease in the chest or abdomen. 2. Stable treated tumor in the apical right upper lobe. Stable treated right posterior second rib and right T2 vertebral lesions. Stable treated right  adrenal metastasis. 3. Aortic atherosclerosis. 4. Moderate emphysema with mild diffuse bronchial wall thickening, suggesting COPD.    06/12/2017 Imaging    CT CAP 1. Similar appearance of treated primary within the right apex. 2. Similar areas of sclerosis within the right second rib, T2, and less so T1 vertebral bodies. These are most consistent with treated metastasis. 3. Similar right adrenal treated metastasis. 4. No evidence of new or progressive disease. 5. Similar right and progressive left areas of bronchial wall thickening and probable mucoid impaction. Correlate with interval infectious symptoms. 6.  Emphysema (ICD10-J43.9). 7. Coronary artery atherosclerosis. Aortic Atherosclerosis (ICD10-I70.0).     10/16/2017 Imaging    CT CAP 1. Stable appearance of the prior Pancoast tumor and related bony findings in the right second rib and right T1 and T2 vertebra compatible with successfully treated tumor. No significant enlargement or new lesions are identified. Similarly the treated right adrenal metastatic lesion is stable in appearance. 2. Upper normal size right hilar lymph node may warrant surveillance. Currently 9 mm in short axis. 3. Other imaging findings of potential clinical significance: Aortic Atherosclerosis (ICD10-I70.0) and Emphysema (ICD10-J43.9). Scattered proximal sigmoid colon diverticula. Mucus plugging medially in the left upper lobe and posteriorly in the right upper lobe.       INTERVAL HISTORY:  Melissa Sullivan 62 y.o. female returns for routine follow-up for metastatic lung cancer. Patient is here today and tolerating her treatment well. She has no complaints and is overall ready for her next cycle. She denies any new cough. Denies any skin rashes or mouth sores. Denies any abdominal pain. Denies any nausea, vomiting, or diarrhea. She reports her appetite at 100% and she is maintaining her weight well. She is taking marinol to help with this. Her energy  level is 75%. She has a dentist appointment on 11/29 to have her osteonecrosis of her upper jaw assessed. She reports using the mouthwash as prescribed.    REVIEW OF SYSTEMS:  Review of Systems  Constitutional:       Occasional cramping of her right hand  All other systems reviewed and are negative.    PAST MEDICAL/SURGICAL HISTORY:  Past Medical History:  Diagnosis Date  . Adrenal mass, right ([place]) 07/28/2014  . Anemia   . Bone metastases ([place]) 04/05/2016  . Cancer ([place])    lung  right  . Diabetes mellitus without complication ([place])   . GERD (gastroesophageal reflux disease)   . Hyperlipidemia   . Hypertension   . Hypothyroidism due to medication 01/30/2017  . Lung mass 07/28/2014  . Reflux    Past Surgical History:  Procedure Laterality Date  . AMPUTATION Right 02/22/2017   Procedure: PARTIAL AMPUTATION RIGHT GREAT TOE;  Surgeon: Caprice Beaver, DPM;  Location: [facility] ORS;  Service: Podiatry;  Laterality: Right;  . APPENDECTOMY    . ESOPHAGOGASTRODUODENOSCOPY N/A 12/09/2014   ASN:[id number] esophageal stricture/mild-to-noderate erosive gastritis. negative H.pylori  . FLEXIBLE SIGMOIDOSCOPY  2011   Dr. [name]: hyperplastic polyp  . LUNG BIOPSY Right 07/2014   CT guided  . MALONEY DILATION N/A 12/09/2014  Procedure: MALONEY DILATION;  Surgeon: [name], MD;  Location: [facility] ENDO SUITE;  Service: Endoscopy;  Laterality: N/A;  . PORTACATH PLACEMENT Left 09/01/2014  . SAVORY DILATION N/A 12/09/2014   Procedure: SAVORY DILATION;  Surgeon: [name], MD;  Location: [facility] ENDO SUITE;  Service: Endoscopy;  Laterality: N/A;     SOCIAL HISTORY:  Social History   Socioeconomic History  . Marital status: Married    Spouse name: Not on file  . Number of children: Not on file  . Years of education: Not on file  . Highest education level: Not on file  Occupational History  . Not on file  Social Needs  . Financial resource strain: Not on file  . Food insecurity:    Worry: Not on  file    Inability: Not on file  . Transportation needs:    Medical: Not on file    Non-medical: Not on file  Tobacco Use  . Smoking status: Light Tobacco Smoker    Packs/day: 0.30    Years: 20.00    Pack years: 6.00    Types: Cigarettes  . Smokeless tobacco: Never Used  Substance and Sexual Activity  . Alcohol use: No  . Drug use: No  . Sexual activity: Not on file  Lifestyle  . Physical activity:    Days per week: Not on file    Minutes per session: Not on file  . Stress: Not on file  Relationships  . Social connections:    Talks on phone: Not on file    Gets together: Not on file    Attends religious service: Not on file    Active member of club or organization: Not on file    Attends meetings of clubs or organizations: Not on file    Relationship status: Not on file  . Intimate partner violence:    Fear of current or ex partner: Not on file    Emotionally abused: Not on file    Physically abused: Not on file    Forced sexual activity: Not on file  Other Topics Concern  . Not on file  Social History Narrative  . Not on file    FAMILY HISTORY:  Family History  Problem Relation Age of Onset  . Cancer Sister   . Colon cancer Neg Hx     CURRENT MEDICATIONS:  Outpatient Encounter Medications as of 10/11/2018  Medication Sig  . amoxicillin (AMOXIL) 500 MG capsule   . Calcium Carb-Cholecalciferol (CALCIUM 1000 + D PO) Take 1 tablet by mouth daily.  . chlorhexidine (PERIDEX) 0.12 % solution   . denosumab (XGEVA) 120 MG/1.7ML SOLN injection Inject 120 mg into the skin every 28 (twenty-eight) days.  Marland Kitchen dronabinol (MARINOL) 2.5 MG capsule Take 1 capsule (2.5 mg total) by mouth 2 (two) times daily before lunch and supper.  Marland Kitchen ENSURE (ENSURE) Take 1 Can by mouth 4 (four) times daily.  . Fluticasone-Salmeterol (ADVAIR DISKUS) 500-50 MCG/DOSE AEPB Inhale 1 puff into the lungs 2 (two) times daily. (Patient taking differently: Inhale 1 puff into the lungs 2 (two) times daily as  needed (for respiratory issues.). )  . ibuprofen (ADVIL,MOTRIN) 200 MG tablet Take 400 mg by mouth every 8 (eight) hours as needed (for pain.).  Marland Kitchen levothyroxine (SYNTHROID, LEVOTHROID) 25 MCG tablet TAKE 1/2 (ONE-HALF) TABLET BY MOUTH ONCE DAILY BEFORE BREAKFAST  . lidocaine-prilocaine (EMLA) cream APPLY A QUARTER SIZE AMOUNT TO PORT SITE 1 HOUR PRIOR TO CHEMO.  DO NOT RUB IN.  [place]  WITH PLASTIC WRAP  . magic mouthwash SOLN Take 5 mLs by mouth 4 (four) times daily as needed for mouth pain.  . Nivolumab (OPDIVO IV) Inject into the vein every 28 (twenty-eight) days.  Marland Kitchen omeprazole (PRILOSEC) 40 MG capsule Take 1 capsule (40 mg total) by mouth daily. TAKE 30 MINS PRIOR TO YOUR FIRST MEAL.  Marland Kitchen ondansetron (ZOFRAN) 8 MG tablet Take 1 tablet (8 mg total) by mouth every 8 (eight) hours as needed for nausea or vomiting.  . Oxycodone HCl 10 MG TABS Take 1-2 tablets (10-20 mg total) by mouth every 6 (six) hours as needed (pain.).  Marland Kitchen simvastatin (ZOCOR) 40 MG tablet TAKE ONE TABLET BY MOUTH ONCE DAILY IN THE MORNING  . [DISCONTINUED] dronabinol (MARINOL) 2.5 MG capsule Take 1 capsule (2.5 mg total) by mouth 2 (two) times daily before lunch and supper.   No facility-administered encounter medications on file as of 10/11/2018.     ALLERGIES:  Allergies  Allergen Reactions  . Xgeva [Denosumab]     osteonecrosis     PHYSICAL EXAM:  ECOG Performance status: 1  Vitals:   10/11/18 0938  BP: 117/72  Pulse: 87  Resp: 18  Temp: 98.3 F (36.8 C)  SpO2: 100%   Filed Weights   10/11/18 0938  Weight: 98 lb 11.2 oz (44.8 kg)    Physical Exam  Constitutional: She is oriented to person, place, and time. She appears well-developed and well-nourished.  Musculoskeletal: Normal range of motion.  Neurological: She is alert and oriented to person, place, and time.  Skin: Skin is warm and dry.  Psychiatric: She has a normal mood and affect. Her behavior is normal. Judgment and thought content normal.      LABORATORY DATA:  I have reviewed the labs as listed.  CBC    Component Value Date/Time   WBC 7.7 10/11/2018 0846   RBC 4.11 10/11/2018 0846   HGB 11.8 (L) 10/11/2018 0846   HCT 38.4 10/11/2018 0846   PLT 262 10/11/2018 0846   MCV 93.4 10/11/2018 0846   MCH 28.7 10/11/2018 0846   MCHC 30.7 10/11/2018 0846   RDW 16.1 (H) 10/11/2018 0846   LYMPHSABS 2.2 10/11/2018 0846   MONOABS 0.6 10/11/2018 0846   EOSABS 0.1 10/11/2018 0846   BASOSABS 0.0 10/11/2018 0846   CMP Latest Ref Rng & Units 10/11/2018 09/12/2018 08/15/2018  Glucose 70 - 99 mg/dL 102(H) 113(H) 104(H)  BUN 8 - 23 mg/dL 15 17 12   Creatinine 0.44 - 1.00 mg/dL 1.28(H) 1.14(H) 1.18(H)  Sodium 135 - 145 mmol/L 138 138 137  Potassium 3.5 - 5.1 mmol/L 4.0 4.1 4.2  Chloride 98 - 111 mmol/L 107 108 103  CO2 22 - 32 mmol/L 24 25 25   Calcium 8.9 - 10.3 mg/dL 9.5 9.7 10.0  Total Protein 6.5 - 8.1 g/dL 7.7 7.6 8.3(H)  Total Bilirubin 0.3 - 1.2 mg/dL 0.5 0.4 0.6  Alkaline Phos 38 - 126 U/L 64 66 65  AST 15 - 41 U/L 17 18 16   ALT 0 - 44 U/L 14 15 11        DIAGNOSTIC IMAGING:  I have reviewed images of her CT scan and discussed with the patient.     ASSESSMENT & PLAN:   Cancer of upper lobe of right lung 1.  Metastatic poorly differentiated adenocarcinoma of the lung to the bones and right adrenal: -Foundation 1 testing with 14 genomic alterations, no approved FDA therapies and the patient tumor type - On opdivo 480 mg monthly  started on 02/25/2015 -CT CAP on 06/07/2018 showed stable right upper lobe lung mass and right adrenal meta stasis.  - Last treatment of Opdivo was on 09/17/2018.  She is tolerating very well.  No immune mediated side effects were elicited. - We discussed the results of the CT scan dated 10/04/2018 which shows stable pleural parenchymal thickening of the right lung apex.  Stable right adrenal gland noted. - I have reviewed her labs.  She may proceed with next cycle of Opdivo.  We will reevaluate  her in 2 months.  I plan to repeat CT scan in 3 months.  2.  CKD: Creatinine today is 1.28.  Her baseline is around 1.2-1.3.   3.  Right shoulder pain and pain in the back of the legs: She takes oxycodone 10 mg as needed.  She is not requiring on a regular basis.  4.  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 - She is using chlorhexidine mouthwash.  She has a follow-up appointment with her oral surgeon.  We will hold her denosumab indefinitely.  5.  Weight loss: She is taking Marinol 2.5 mg twice daily.  She is drinking can of Ensure/boost daily.  Weight is more or less stable.      Orders placed this encounter:  No orders of the defined types were placed in this encounter.     Derek Jack, MD [place] [phone]

## 2018-10-11 NOTE — Progress Notes (Signed)
Patient seen by the oncologist with lab review and ok to treat today verbal order Dr. Delton Coombes.  Holding xgeva until dental visits are complete.    Patient tolerated treatment with no complaints voiced.  Port site clean and dry.  No blood return noted but patient denied pain with flush.  Band aid applied.  VSs with discharge and left ambulatory with no s/s of distress noted.

## 2018-10-11 NOTE — Patient Instructions (Signed)
[facility] Discharge Instructions for Patients Receiving Chemotherapy  Today you received the following chemotherapy agents opdivo/    If you develop nausea and vomiting that is not controlled by your nausea medication, call the clinic.   BELOW ARE SYMPTOMS THAT SHOULD BE REPORTED IMMEDIATELY:  *FEVER GREATER THAN 100.5 F  *CHILLS WITH OR WITHOUT FEVER  NAUSEA AND VOMITING THAT IS NOT CONTROLLED WITH YOUR NAUSEA MEDICATION  *UNUSUAL SHORTNESS OF BREATH  *UNUSUAL BRUISING OR BLEEDING  TENDERNESS IN MOUTH AND THROAT WITH OR WITHOUT PRESENCE OF ULCERS  *URINARY PROBLEMS  *BOWEL PROBLEMS  UNUSUAL RASH Items with * indicate a potential emergency and should be followed up as soon as possible.  Feel free to call the clinic should you have any questions or concerns. The clinic phone number is [phone].  Please show the [place] at check-in to the Emergency Department and triage nurse.

## 2018-10-16 DIAGNOSIS — G629 Polyneuropathy, unspecified: Secondary | ICD-10-CM | POA: Diagnosis not present

## 2018-10-16 DIAGNOSIS — C3490 Malignant neoplasm of unspecified part of unspecified bronchus or lung: Secondary | ICD-10-CM | POA: Diagnosis not present

## 2018-10-16 DIAGNOSIS — B351 Tinea unguium: Secondary | ICD-10-CM | POA: Diagnosis not present

## 2018-11-08 ENCOUNTER — Inpatient Hospital Stay (HOSPITAL_COMMUNITY): Payer: Medicare HMO

## 2018-11-08 ENCOUNTER — Other Ambulatory Visit: Payer: Self-pay

## 2018-11-08 ENCOUNTER — Encounter (HOSPITAL_COMMUNITY): Payer: Self-pay

## 2018-11-08 ENCOUNTER — Inpatient Hospital Stay (HOSPITAL_COMMUNITY): Payer: Medicare HMO | Attending: Hematology

## 2018-11-08 VITALS — BP 126/69 | HR 81 | Temp 98.5°F | Resp 17

## 2018-11-08 DIAGNOSIS — Z79899 Other long term (current) drug therapy: Secondary | ICD-10-CM | POA: Insufficient documentation

## 2018-11-08 DIAGNOSIS — Z5112 Encounter for antineoplastic immunotherapy: Secondary | ICD-10-CM | POA: Insufficient documentation

## 2018-11-08 DIAGNOSIS — E278 Other specified disorders of adrenal gland: Secondary | ICD-10-CM

## 2018-11-08 DIAGNOSIS — C7971 Secondary malignant neoplasm of right adrenal gland: Secondary | ICD-10-CM | POA: Insufficient documentation

## 2018-11-08 DIAGNOSIS — C3411 Malignant neoplasm of upper lobe, right bronchus or lung: Secondary | ICD-10-CM

## 2018-11-08 DIAGNOSIS — C7951 Secondary malignant neoplasm of bone: Secondary | ICD-10-CM | POA: Diagnosis not present

## 2018-11-08 LAB — COMPREHENSIVE METABOLIC PANEL
ALK PHOS: 68 U/L (ref 38–126)
ALT: 14 U/L (ref 0–44)
ANION GAP: 7 (ref 5–15)
AST: 18 U/L (ref 15–41)
Albumin: 3.8 g/dL (ref 3.5–5.0)
BILIRUBIN TOTAL: 0.2 mg/dL — AB (ref 0.3–1.2)
BUN: 12 mg/dL (ref 8–23)
CALCIUM: 10.2 mg/dL (ref 8.9–10.3)
CO2: 27 mmol/L (ref 22–32)
CREATININE: 1.13 mg/dL — AB (ref 0.44–1.00)
Chloride: 105 mmol/L (ref 98–111)
GFR calc non Af Amer: 52 mL/min — ABNORMAL LOW (ref >60)
Glucose, Bld: 92 mg/dL (ref 70–99)
Potassium: 3.9 mmol/L (ref 3.5–5.1)
SODIUM: 139 mmol/L (ref 135–145)
TOTAL PROTEIN: 7.9 g/dL (ref 6.5–8.1)

## 2018-11-08 LAB — CBC WITH DIFFERENTIAL/PLATELET
ABS IMMATURE GRANULOCYTES: 0.03 10*3/uL (ref 0.00–0.07)
Basophils Absolute: 0 10*3/uL (ref 0.0–0.1)
Basophils Relative: 0 %
EOS PCT: 1 %
Eosinophils Absolute: 0.1 10*3/uL (ref 0.0–0.5)
HEMATOCRIT: 39.8 % (ref 36.0–46.0)
Hemoglobin: 12.2 g/dL (ref 12.0–15.0)
Immature Granulocytes: 0 %
LYMPHS ABS: 2.3 10*3/uL (ref 0.7–4.0)
Lymphocytes Relative: 24 %
MCH: 28.6 pg (ref 26.0–34.0)
MCHC: 30.7 g/dL (ref 30.0–36.0)
MCV: 93.2 fL (ref 80.0–100.0)
MONOS PCT: 7 %
Monocytes Absolute: 0.6 10*3/uL (ref 0.1–1.0)
NEUTROS ABS: 6.4 10*3/uL (ref 1.7–7.7)
Neutrophils Relative %: 68 %
Platelets: 337 10*3/uL (ref 150–400)
RBC: 4.27 MIL/uL (ref 3.87–5.11)
RDW: 15.7 % — ABNORMAL HIGH (ref 11.5–15.5)
WBC: 9.5 10*3/uL (ref 4.0–10.5)
nRBC: 0 % (ref 0.0–0.2)

## 2018-11-08 MED ORDER — HEPARIN SOD (PORK) LOCK FLUSH 100 UNIT/ML IV SOLN
500.0000 [IU] | Freq: Once | INTRAVENOUS | Status: AC | PRN
  Administered 2018-11-08: 500 [IU]

## 2018-11-08 MED ORDER — SODIUM CHLORIDE 0.9 % IV SOLN
Freq: Once | INTRAVENOUS | Status: AC
  Administered 2018-11-08: 11:00:00 via INTRAVENOUS

## 2018-11-08 MED ORDER — SODIUM CHLORIDE 0.9% FLUSH
10.0000 mL | INTRAVENOUS | Status: DC | PRN
  Administered 2018-11-08: 10 mL
  Filled 2018-11-08: qty 10

## 2018-11-08 MED ORDER — SODIUM CHLORIDE 0.9 % IV SOLN
480.0000 mg | Freq: Once | INTRAVENOUS | Status: AC
  Administered 2018-11-08: 480 mg via INTRAVENOUS
  Filled 2018-11-08: qty 48

## 2018-11-08 NOTE — Progress Notes (Signed)
Pt presents today for Opdivo treatment. VSS. Pt has no complaints of pain or any changes since the last visit. Pt is currently having dental treatments and XGeva on hold until treatments complete. MAR updated and reviewed. Pt currently taking Amoxicillin as ordered by dentist. Pt requesting medical records that show date she was diagnosed with cancer.   Labs reviewed with Dr. Delton Coombes. Reported pt taking Amoxicillin for dental work. Per MD, " Proceed with treatment."   Treatment given today per MD orders. Tolerated infusion without adverse affects. Vital signs stable. No complaints at this time. Discharged from clinic ambulatory. F/U with [facility] as scheduled.

## 2018-11-08 NOTE — Patient Instructions (Signed)
[facility] Discharge Instructions for Patients Receiving Chemotherapy  Today you received the following chemotherapy agents   To help prevent nausea and vomiting after your treatment, we encourage you to take your nausea medication   If you develop nausea and vomiting that is not controlled by your nausea medication, call the clinic.   BELOW ARE SYMPTOMS THAT SHOULD BE REPORTED IMMEDIATELY:  *FEVER GREATER THAN 100.5 F  *CHILLS WITH OR WITHOUT FEVER  NAUSEA AND VOMITING THAT IS NOT CONTROLLED WITH YOUR NAUSEA MEDICATION  *UNUSUAL SHORTNESS OF BREATH  *UNUSUAL BRUISING OR BLEEDING  TENDERNESS IN MOUTH AND THROAT WITH OR WITHOUT PRESENCE OF ULCERS  *URINARY PROBLEMS  *BOWEL PROBLEMS  UNUSUAL RASH Items with * indicate a potential emergency and should be followed up as soon as possible.  Feel free to call the clinic should you have any questions or concerns. The clinic phone number is [phone].  Please show the CHEMO ALERT CARD at check-in to the Emergency Department and triage nurse.   

## 2018-11-19 ENCOUNTER — Other Ambulatory Visit (HOSPITAL_COMMUNITY): Payer: Self-pay | Admitting: Nurse Practitioner

## 2018-11-19 DIAGNOSIS — E032 Hypothyroidism due to medicaments and other exogenous substances: Secondary | ICD-10-CM

## 2018-12-06 ENCOUNTER — Other Ambulatory Visit: Payer: Self-pay

## 2018-12-06 ENCOUNTER — Inpatient Hospital Stay (HOSPITAL_COMMUNITY): Payer: Medicare HMO | Attending: Hematology

## 2018-12-06 ENCOUNTER — Inpatient Hospital Stay (HOSPITAL_BASED_OUTPATIENT_CLINIC_OR_DEPARTMENT_OTHER): Payer: Medicare HMO | Admitting: Hematology

## 2018-12-06 ENCOUNTER — Inpatient Hospital Stay (HOSPITAL_COMMUNITY): Payer: Medicare HMO

## 2018-12-06 ENCOUNTER — Encounter (HOSPITAL_COMMUNITY): Payer: Self-pay | Admitting: Hematology

## 2018-12-06 VITALS — BP 112/65 | HR 91 | Temp 98.3°F | Resp 17

## 2018-12-06 VITALS — BP 114/67 | HR 93 | Temp 99.1°F | Resp 16 | Wt 95.2 lb

## 2018-12-06 DIAGNOSIS — Z923 Personal history of irradiation: Secondary | ICD-10-CM | POA: Diagnosis not present

## 2018-12-06 DIAGNOSIS — C7971 Secondary malignant neoplasm of right adrenal gland: Secondary | ICD-10-CM | POA: Insufficient documentation

## 2018-12-06 DIAGNOSIS — E119 Type 2 diabetes mellitus without complications: Secondary | ICD-10-CM | POA: Diagnosis not present

## 2018-12-06 DIAGNOSIS — E1122 Type 2 diabetes mellitus with diabetic chronic kidney disease: Secondary | ICD-10-CM | POA: Diagnosis not present

## 2018-12-06 DIAGNOSIS — C7951 Secondary malignant neoplasm of bone: Secondary | ICD-10-CM | POA: Insufficient documentation

## 2018-12-06 DIAGNOSIS — Z79899 Other long term (current) drug therapy: Secondary | ICD-10-CM | POA: Insufficient documentation

## 2018-12-06 DIAGNOSIS — E039 Hypothyroidism, unspecified: Secondary | ICD-10-CM | POA: Diagnosis not present

## 2018-12-06 DIAGNOSIS — I129 Hypertensive chronic kidney disease with stage 1 through stage 4 chronic kidney disease, or unspecified chronic kidney disease: Secondary | ICD-10-CM | POA: Insufficient documentation

## 2018-12-06 DIAGNOSIS — I10 Essential (primary) hypertension: Secondary | ICD-10-CM | POA: Diagnosis not present

## 2018-12-06 DIAGNOSIS — Z9221 Personal history of antineoplastic chemotherapy: Secondary | ICD-10-CM | POA: Diagnosis not present

## 2018-12-06 DIAGNOSIS — F17210 Nicotine dependence, cigarettes, uncomplicated: Secondary | ICD-10-CM | POA: Diagnosis not present

## 2018-12-06 DIAGNOSIS — Z5112 Encounter for antineoplastic immunotherapy: Secondary | ICD-10-CM | POA: Insufficient documentation

## 2018-12-06 DIAGNOSIS — N189 Chronic kidney disease, unspecified: Secondary | ICD-10-CM

## 2018-12-06 DIAGNOSIS — R634 Abnormal weight loss: Secondary | ICD-10-CM

## 2018-12-06 DIAGNOSIS — C3411 Malignant neoplasm of upper lobe, right bronchus or lung: Secondary | ICD-10-CM

## 2018-12-06 DIAGNOSIS — E278 Other specified disorders of adrenal gland: Secondary | ICD-10-CM

## 2018-12-06 DIAGNOSIS — R69 Illness, unspecified: Secondary | ICD-10-CM | POA: Diagnosis not present

## 2018-12-06 LAB — CBC WITH DIFFERENTIAL/PLATELET
Abs Immature Granulocytes: 0.04 10*3/uL (ref 0.00–0.07)
Basophils Absolute: 0 10*3/uL (ref 0.0–0.1)
Basophils Relative: 0 %
Eosinophils Absolute: 0.1 10*3/uL (ref 0.0–0.5)
Eosinophils Relative: 1 %
HCT: 39.8 % (ref 36.0–46.0)
Hemoglobin: 12.2 g/dL (ref 12.0–15.0)
IMMATURE GRANULOCYTES: 0 %
Lymphocytes Relative: 21 %
Lymphs Abs: 2.1 10*3/uL (ref 0.7–4.0)
MCH: 28 pg (ref 26.0–34.0)
MCHC: 30.7 g/dL (ref 30.0–36.0)
MCV: 91.5 fL (ref 80.0–100.0)
Monocytes Absolute: 0.6 10*3/uL (ref 0.1–1.0)
Monocytes Relative: 6 %
NEUTROS PCT: 72 %
Neutro Abs: 7 10*3/uL (ref 1.7–7.7)
Platelets: 360 10*3/uL (ref 150–400)
RBC: 4.35 MIL/uL (ref 3.87–5.11)
RDW: 15.8 % — ABNORMAL HIGH (ref 11.5–15.5)
WBC: 9.7 10*3/uL (ref 4.0–10.5)
nRBC: 0 % (ref 0.0–0.2)

## 2018-12-06 LAB — COMPREHENSIVE METABOLIC PANEL
ALK PHOS: 62 U/L (ref 38–126)
ALT: 12 U/L (ref 0–44)
AST: 17 U/L (ref 15–41)
Albumin: 3.9 g/dL (ref 3.5–5.0)
Anion gap: 7 (ref 5–15)
BUN: 11 mg/dL (ref 8–23)
CALCIUM: 9.9 mg/dL (ref 8.9–10.3)
CO2: 25 mmol/L (ref 22–32)
Chloride: 104 mmol/L (ref 98–111)
Creatinine, Ser: 1.09 mg/dL — ABNORMAL HIGH (ref 0.44–1.00)
GFR calc Af Amer: >60 mL/min (ref >60)
GFR calc non Af Amer: 54 mL/min — ABNORMAL LOW (ref >60)
Glucose, Bld: 118 mg/dL — ABNORMAL HIGH (ref 70–99)
Potassium: 4 mmol/L (ref 3.5–5.1)
Sodium: 136 mmol/L (ref 135–145)
Total Bilirubin: 0.3 mg/dL (ref 0.3–1.2)
Total Protein: 8.5 g/dL — ABNORMAL HIGH (ref 6.5–8.1)

## 2018-12-06 MED ORDER — SODIUM CHLORIDE 0.9% FLUSH
10.0000 mL | INTRAVENOUS | Status: DC | PRN
  Administered 2018-12-06: 10 mL
  Filled 2018-12-06: qty 10

## 2018-12-06 MED ORDER — SODIUM CHLORIDE 0.9 % IV SOLN
480.0000 mg | Freq: Once | INTRAVENOUS | Status: AC
  Administered 2018-12-06: 480 mg via INTRAVENOUS
  Filled 2018-12-06: qty 48

## 2018-12-06 MED ORDER — SODIUM CHLORIDE 0.9 % IV SOLN
Freq: Once | INTRAVENOUS | Status: AC
  Administered 2018-12-06: 11:00:00 via INTRAVENOUS

## 2018-12-06 MED ORDER — HEPARIN SOD (PORK) LOCK FLUSH 100 UNIT/ML IV SOLN
500.0000 [IU] | Freq: Once | INTRAVENOUS | Status: AC | PRN
  Administered 2018-12-06: 500 [IU]

## 2018-12-06 NOTE — Patient Instructions (Addendum)
[place] at [facility] Discharge Instructions  Follow up in 4 weeks with labs and treatment.    Thank you for choosing [place] at [facility] to provide your oncology and hematology care.  To afford each patient quality time with our provider, please arrive at least 15 minutes before your scheduled appointment time.   If you have a lab appointment with the [facility] please come in thru the  Main Entrance and check in at the main information desk  You need to re-schedule your appointment should you arrive 10 or more minutes late.  We strive to give you quality time with our providers, and arriving late affects you and other patients whose appointments are after yours.  Also, if you no show three or more times for appointments you may be dismissed from the clinic at the providers discretion.     Again, thank you for choosing [facility].  Our hope is that these requests will decrease the amount of time that you wait before being seen by our physicians.       _____________________________________________________________  Should you have questions after your visit to [facility], please contact our office at [phone] between the hours of 8:00 a.m. and 4:30 p.m.  Voicemails left after 4:00 p.m. will not be returned until the following business day.  For prescription refill requests, have your pharmacy contact our office and allow 72 hours.    Cancer Center Support Programs:   > Cancer Support Group  2nd Tuesday of the month 1pm-2pm, Journey Room

## 2018-12-06 NOTE — Patient Instructions (Signed)
[facility] Discharge Instructions for Patients Receiving Chemotherapy  Today you received the following chemotherapy agents   To help prevent nausea and vomiting after your treatment, we encourage you to take your nausea medication   If you develop nausea and vomiting that is not controlled by your nausea medication, call the clinic.   BELOW ARE SYMPTOMS THAT SHOULD BE REPORTED IMMEDIATELY:  *FEVER GREATER THAN 100.5 F  *CHILLS WITH OR WITHOUT FEVER  NAUSEA AND VOMITING THAT IS NOT CONTROLLED WITH YOUR NAUSEA MEDICATION  *UNUSUAL SHORTNESS OF BREATH  *UNUSUAL BRUISING OR BLEEDING  TENDERNESS IN MOUTH AND THROAT WITH OR WITHOUT PRESENCE OF ULCERS  *URINARY PROBLEMS  *BOWEL PROBLEMS  UNUSUAL RASH Items with * indicate a potential emergency and should be followed up as soon as possible.  Feel free to call the clinic should you have any questions or concerns. The clinic phone number is [phone].  Please show the CHEMO ALERT CARD at check-in to the Emergency Department and triage nurse.   

## 2018-12-06 NOTE — Progress Notes (Signed)
Melissa Sullivan, [place] [address]   CLINIC:  Medical Oncology/Hematology  PCP:  [name], MD [address] [place] [address] [phone]   REASON FOR VISIT: Follow-up for Metastatic poorly differentiated adenocarcinoma of the lung to the bones and right adrenal  CURRENT THERAPY: Opidvo every 4 weeks  BRIEF ONCOLOGIC HISTORY:    Cancer of upper lobe of right lung ([place])   07/28/2014 Imaging    CT chest: Large R apical mass consistent with malignancy. This is destroying the R 2nd rib with extension into adjacent soft tissue. R hilar adenopathy with R 5cm adrenal metastatic lesion.    08/01/2014 Initial Biopsy    Lung, needle/core biopsy(ies), right upper lobe - POORLY DIFFERENTIATED ADENOCARCINOMA, SEE COMMENT.    08/08/2014 PET scan    Large hypermetabolic R apical mass with evidence of direct chest wall and mediastinal invasion, right retrocrural lymphadenopathy, extensive retroperitoneal lymphadenopathy, and metastatic lesions to the adrenal glands     09/02/2014 - 11/04/2014 Chemotherapy    Cisplatin/Pemetrexed/Avastin every 21 days x 4 cycles    10/07/2014 - 10/27/2014 Radiation Therapy    Right lung apex for control of brachioplexopathy.    12/24/2014 - 02/25/2015 Chemotherapy    Alimta/Avastin every 21 days.    02/20/2015 Imaging    Increase in size of right adrenal metastasis and subjacent confluent retrocaval lymphadenopathy    02/25/2015 -  Chemotherapy    Nivolumab, zometa    05/04/2015 Imaging    CT CAP- Stable to slight decrease in the posterior right apical lesion. Stable appearance of posterior right upper rib an upper thoracic bony lesions. Slight improvement in right upper lobe tree-in-bud opacity. No new or progressive findings in...    07/28/2015 Imaging    CT CAP- Reduced size of the right apical pleural parenchymal lesion and reduced size of the right adrenal metastatic lesion. Resolution of prior retrocrural  adenopathy.  Right eccentric T1 and T2 sclerosis with sclerosis and tapering of the right second..    11/17/2015 Imaging    CT CAP- Stable soft tissue thickening in the apex of the right hemi thorax. Stable right adrenal metastasis. Nodularity along the trachea and mainstem bronchi, relatively new from 07/28/2015, favoring adherent debris.    11/18/2015 Treatment Plan Change    Zometa HELD for upcoming tooth extraction    11/24/2015 Treatment Plan Change    Zometa on hold at this time in preparation for tooth extraction in March 2017.  Zometa las given on 11/18/2015.    02/03/2016 Imaging    CT CAP- Heterogeneous right apical masslike consolidation and right adrenal metastasis are unchanged    04/06/2016 Treatment Plan Change    Zometa restarted 6 weeks out from tooth extraction (04/06/2016)    05/12/2016 Imaging    CT CAP- NED in the chest, abdomen or pelvis.  Some areas of nodularity associated with the mainstem bronchi in the left upper lobe bronchus, favored to represent adherent inspissated secretions    08/17/2016 Imaging    CT CAP- 1. Stable CTs of the chest and abdomen. No evidence of progressive metastatic disease. 2. Probable treated tumor at the right apex, right adrenal gland and T2 vertebral body, stable. 3. Fluctuating nodularity along the walls of the trachea and mainstem bronchi, likely secretions.    12/12/2016 Imaging    Further decrease in size of treated tumor within the right apex. 2. Stable treated tumor involving the right second rib and T2 vertebra. 3. Stable  right adrenal gland treated tumor. 4. Emphysema 5. Aortic atherosclerosis    01/04/2017 Imaging    MRI brain- Normal brain MRI.  No intracranial metastatic disease.    03/15/2017 Imaging    CT CAP- 1. No new or progressive metastatic disease in the chest or abdomen. 2. Stable treated tumor in the apical right upper lobe. Stable treated right posterior second rib and right T2 vertebral lesions. Stable  treated right adrenal metastasis. 3. Aortic atherosclerosis. 4. Moderate emphysema with mild diffuse bronchial wall thickening, suggesting COPD.    06/12/2017 Imaging    CT CAP 1. Similar appearance of treated primary within the right apex. 2. Similar areas of sclerosis within the right second rib, T2, and less so T1 vertebral bodies. These are most consistent with treated metastasis. 3. Similar right adrenal treated metastasis. 4. No evidence of new or progressive disease. 5. Similar right and progressive left areas of bronchial wall thickening and probable mucoid impaction. Correlate with interval infectious symptoms. 6.  Emphysema (ICD10-J43.9). 7. Coronary artery atherosclerosis. Aortic Atherosclerosis (ICD10-I70.0).     10/16/2017 Imaging    CT CAP 1. Stable appearance of the prior Pancoast tumor and related bony findings in the right second rib and right T1 and T2 vertebra compatible with successfully treated tumor. No significant enlargement or new lesions are identified. Similarly the treated right adrenal metastatic lesion is stable in appearance. 2. Upper normal size right hilar lymph node may warrant surveillance. Currently 9 mm in short axis. 3. Other imaging findings of potential clinical significance: Aortic Atherosclerosis (ICD10-I70.0) and Emphysema (ICD10-J43.9). Scattered proximal sigmoid colon diverticula. Mucus plugging medially in the left upper lobe and posteriorly in the right upper lobe.       INTERVAL HISTORY:  Melissa Sullivan 63 y.o. female returns for routine follow-up for metastatic poorly differentiated adenocarcinoma of the lung to the bones and right adrenal. She does experience a slight headache behind her ears on on the right side of her head a few days after treatment. It has eased off now. Her appetite has decreased and she has lost 3 pounds. Denies any nausea, vomiting, or diarrhea. Denies any new pains. Had not noticed any recent bleeding such  as epistaxis, hematuria or hematochezia. Denies recent chest pain on exertion, shortness of breath on minimal exertion, pre-syncopal episodes, or palpitations. Denies any numbness or tingling in hands or feet. Denies any recent fevers, infections, or recent hospitalizations. Patient reports appetite at 75% and energy level at 75%.     REVIEW OF SYSTEMS:  Review of Systems  Neurological: Positive for headaches.  All other systems reviewed and are negative.    PAST MEDICAL/SURGICAL HISTORY:  Past Medical History:  Diagnosis Date  . Adrenal mass, right ([place]) 07/28/2014  . Anemia   . Bone metastases ([place]) 04/05/2016  . Cancer ([place])    lung  right  . Diabetes mellitus without complication ([place])   . GERD (gastroesophageal reflux disease)   . Hyperlipidemia   . Hypertension   . Hypothyroidism due to medication 01/30/2017  . Lung mass 07/28/2014  . Reflux    Past Surgical History:  Procedure Laterality Date  . AMPUTATION Right 02/22/2017   Procedure: PARTIAL AMPUTATION RIGHT GREAT TOE;  Surgeon: Caprice Beaver, DPM;  Location: [facility];  Service: Podiatry;  Laterality: Right;  . APPENDECTOMY    . ESOPHAGOGASTRODUODENOSCOPY N/A 12/09/2014   [facility]:ERDEYC esophageal stricture/mild-to-noderate erosive gastritis. negative H.pylori  . FLEXIBLE SIGMOIDOSCOPY  2011   Dr. [name]: hyperplastic polyp  . LUNG BIOPSY Right  07/2014   CT guided  . MALONEY DILATION N/A 12/09/2014   Procedure: [name] DILATION;  Surgeon: [name], MD;  Location: [facility];  Service: Endoscopy;  Laterality: N/A;  . PORTACATH PLACEMENT Left 09/01/2014  . SAVORY DILATION N/A 12/09/2014   Procedure: SAVORY DILATION;  Surgeon: [name], MD;  Location: [facility];  Service: Endoscopy;  Laterality: N/A;     SOCIAL HISTORY:  Social History   Socioeconomic History  . Marital status: Married    Spouse name: Not on file  . Number of children: Not on file  . Years of education: Not on file  . Highest  education level: Not on file  Occupational History  . Not on file  Social Needs  . Financial resource strain: Not on file  . Food insecurity:    Worry: Not on file    Inability: Not on file  . Transportation needs:    Medical: Not on file    Non-medical: Not on file  Tobacco Use  . Smoking status: Light Tobacco Smoker    Packs/day: 0.30    Years: 20.00    Pack years: 6.00    Types: Cigarettes  . Smokeless tobacco: Never Used  Substance and Sexual Activity  . Alcohol use: No  . Drug use: No  . Sexual activity: Not on file  Lifestyle  . Physical activity:    Days per week: Not on file    Minutes per session: Not on file  . Stress: Not on file  Relationships  . Social connections:    Talks on phone: Not on file    Gets together: Not on file    Attends religious service: Not on file    Active member of club or organization: Not on file    Attends meetings of clubs or organizations: Not on file    Relationship status: Not on file  . Intimate partner violence:    Fear of current or ex partner: Not on file    Emotionally abused: Not on file    Physically abused: Not on file    Forced sexual activity: Not on file  Other Topics Concern  . Not on file  Social History Narrative  . Not on file    FAMILY HISTORY:  Family History  Problem Relation Age of Onset  . Cancer Sister   . Colon cancer Neg Hx     CURRENT MEDICATIONS:  Outpatient Encounter Medications as of 12/06/2018  Medication Sig Note  . amoxicillin (AMOXIL) 500 MG capsule    . Calcium Carb-Cholecalciferol (CALCIUM 1000 + D PO) Take 1 tablet by mouth daily.   . chlorhexidine (PERIDEX) 0.12 % solution    . dronabinol (MARINOL) 2.5 MG capsule Take 1 capsule (2.5 mg total) by mouth 2 (two) times daily before lunch and supper.   Marland Kitchen ENSURE (ENSURE) Take 1 Can by mouth 4 (four) times daily.   . Fluticasone-Salmeterol (ADVAIR DISKUS) 500-50 MCG/DOSE AEPB Inhale 1 puff into the lungs 2 (two) times daily. (Patient  taking differently: Inhale 1 puff into the lungs 2 (two) times daily as needed (for respiratory issues.). )   . ibuprofen (ADVIL,MOTRIN) 200 MG tablet Take 400 mg by mouth every 8 (eight) hours as needed (for pain.).   Marland Kitchen levothyroxine (SYNTHROID, LEVOTHROID) 25 MCG tablet TAKE 1/2 (ONE-HALF) TABLET BY MOUTH ONCE DAILY BEFORE BREAKFAST   . lidocaine-prilocaine (EMLA) cream APPLY A QUARTER SIZE AMOUNT TO PORT SITE 1 HOUR PRIOR TO CHEMO.  DO NOT  RUB IN.  COVER WITH PLASTIC WRAP   . magic mouthwash SOLN Take 5 mLs by mouth 4 (four) times daily as needed for mouth pain.   . Nivolumab (OPDIVO IV) Inject into the vein every 28 (twenty-eight) days.   Marland Kitchen omeprazole (PRILOSEC) 40 MG capsule Take 1 capsule (40 mg total) by mouth daily. TAKE 30 MINS PRIOR TO YOUR FIRST MEAL.   Marland Kitchen ondansetron (ZOFRAN) 8 MG tablet Take 1 tablet (8 mg total) by mouth every 8 (eight) hours as needed for nausea or vomiting.   . Oxycodone HCl 10 MG TABS Take 1-2 tablets (10-20 mg total) by mouth every 6 (six) hours as needed (pain.).   Marland Kitchen simvastatin (ZOCOR) 40 MG tablet TAKE ONE TABLET BY MOUTH ONCE DAILY IN THE MORNING   . denosumab (XGEVA) 120 MG/1.7ML SOLN injection Inject 120 mg into the skin every 28 (twenty-eight) days. 11/08/2018: Hold per MD until dental work is complete.    No facility-administered encounter medications on file as of 12/06/2018.     ALLERGIES:  Allergies  Allergen Reactions  . Xgeva [Denosumab]     osteonecrosis     PHYSICAL EXAM:  ECOG Performance status: 1  Vitals:   12/06/18 1000  BP: 114/67  Pulse: 93  Resp: 16  Temp: 99.1 F (37.3 C)  SpO2: 100%   Filed Weights   12/06/18 1000  Weight: 95 lb 4 oz (43.2 kg)    Physical Exam Constitutional:      Appearance: Normal appearance. She is normal weight.  Cardiovascular:     Rate and Rhythm: Normal rate and regular rhythm.     Heart sounds: Normal heart sounds.  Pulmonary:     Effort: Pulmonary effort is normal.     Breath sounds:  Normal breath sounds.  Musculoskeletal: Normal range of motion.  Skin:    General: Skin is warm and dry.  Neurological:     Mental Status: She is alert and oriented to person, place, and time. Mental status is at baseline.  Psychiatric:        Mood and Affect: Mood normal.        Behavior: Behavior normal.        Thought Content: Thought content normal.        Judgment: Judgment normal.      LABORATORY DATA:  I have reviewed the labs as listed.  CBC    Component Value Date/Time   WBC 9.7 12/06/2018 0859   RBC 4.35 12/06/2018 0859   HGB 12.2 12/06/2018 0859   HCT 39.8 12/06/2018 0859   PLT 360 12/06/2018 0859   MCV 91.5 12/06/2018 0859   MCH 28.0 12/06/2018 0859   MCHC 30.7 12/06/2018 0859   RDW 15.8 (H) 12/06/2018 0859   LYMPHSABS 2.1 12/06/2018 0859   MONOABS 0.6 12/06/2018 0859   EOSABS 0.1 12/06/2018 0859   BASOSABS 0.0 12/06/2018 0859   CMP Latest Ref Rng & Units 12/06/2018 11/08/2018 10/11/2018  Glucose 70 - 99 mg/dL 118(H) 92 102(H)  BUN 8 - 23 mg/dL 11 12 15   Creatinine 0.44 - 1.00 mg/dL 1.09(H) 1.13(H) 1.28(H)  Sodium 135 - 145 mmol/L 136 139 138  Potassium 3.5 - 5.1 mmol/L 4.0 3.9 4.0  Chloride 98 - 111 mmol/L 104 105 107  CO2 22 - 32 mmol/L 25 27 24   Calcium 8.9 - 10.3 mg/dL 9.9 10.2 9.5  Total Protein 6.5 - 8.1 g/dL 8.5(H) 7.9 7.7  Total Bilirubin 0.3 - 1.2 mg/dL 0.3 0.2(L) 0.5  Alkaline Phos 38 -  126 U/L 62 68 64  AST 15 - 41 U/L 17 18 17   ALT 0 - 44 U/L 12 14 14        DIAGNOSTIC IMAGING:  I have independently reviewed the scans and discussed with the patient.   I have reviewed Francene Finders, NP's note and agree with the documentation.  I personally performed a face-to-face visit, made revisions and my assessment and plan is as follows.    ASSESSMENT & PLAN:   Cancer of upper lobe of right lung 1.  Metastatic poorly differentiated adenocarcinoma of the lung to the bones and right adrenal: -Foundation 1 testing with 14 genomic alterations, no  approved FDA therapies and the patient tumor type - On opdivo 480 mg monthly started on 02/25/2015 -CT CAP on 06/07/2018 showed stable right upper lobe lung mass and right adrenal meta stasis.  - Last treatment of Opdivo was on 09/17/2018.  She is tolerating very well.  No immune mediated side effects were elicited. - We discussed the results of the CT scan dated 10/04/2018 which shows stable pleural parenchymal thickening of the right lung apex.  Stable right adrenal gland noted. - I have reviewed her labs.  She does not have any immunotherapy related side effects. - She may proceed with Opdivo today.  She will come back in 4 weeks for treatment.  I plan to repeat scans in 8 weeks.  2.  CKD: Her creatinine is better today at 1.09.  3.  Right shoulder pain and pain in the back of the legs: She takes oxycodone 10 mg as needed.  She is not requiring on a regular basis.  4.  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   - We are holding her denosumab indefinitely.  She is using chlorhexidine mouthwash.  She has an appointment with her dentist next Monday.  5.  Weight loss:  -She will continue Marinol 2.5 mg twice daily.  She is also drinking 1 can of Ensure/boost per day.      Orders placed this encounter:  Orders Placed This Encounter  Procedures  . TSH  . CBC with Differential/Platelet  . Comprehensive metabolic panel      Derek Jack, MD [place] [phone]

## 2018-12-06 NOTE — Assessment & Plan Note (Addendum)
1.  Metastatic poorly differentiated adenocarcinoma of the lung to the bones and right adrenal: -Foundation 1 testing with 14 genomic alterations, no approved FDA therapies and the patient tumor type - On opdivo 480 mg monthly started on 02/25/2015 -CT CAP on 06/07/2018 showed stable right upper lobe lung mass and right adrenal meta stasis.  - Last treatment of Opdivo was on 09/17/2018.  She is tolerating very well.  No immune mediated side effects were elicited. - We discussed the results of the CT scan dated 10/04/2018 which shows stable pleural parenchymal thickening of the right lung apex.  Stable right adrenal gland noted. - I have reviewed her labs.  She does not have any immunotherapy related side effects. - She may proceed with Opdivo today.  She will come back in 4 weeks for treatment.  I plan to repeat scans in 8 weeks.  2.  CKD: Her creatinine is better today at 1.09.  3.  Right shoulder pain and pain in the back of the legs: She takes oxycodone 10 mg as needed.  She is not requiring on a regular basis.  4.  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   - We are holding her denosumab indefinitely.  She is using chlorhexidine mouthwash.  She has an appointment with her dentist next Monday.  5.  Weight loss:  -She will continue Marinol 2.5 mg twice daily.  She is also drinking 1 can of Ensure/boost per day.

## 2018-12-06 NOTE — Progress Notes (Signed)
Pt presents today for Opdivo. VSS. Labs reviewed and within parameters. MAR updated and reviewed. Pt states, " I have an appt with my dentist on Jan. 2oth." Per MD note from 10/11/2018 Hold Xgeva indefinitely. Pt continues to take  Amoxicillin as prescribed by her dentist. Pt has a complaint of pressure behind ears bilateral, accompanied by R eye pain.   Treatment given today per MD orders. Tolerated infusion without adverse affects. Vital signs stable. No complaints at this time. Discharged from clinic ambulatory. F/U with [facility] as scheduled.

## 2018-12-20 ENCOUNTER — Other Ambulatory Visit (HOSPITAL_COMMUNITY): Payer: Self-pay | Admitting: *Deleted

## 2018-12-20 DIAGNOSIS — R630 Anorexia: Secondary | ICD-10-CM

## 2018-12-20 DIAGNOSIS — C3411 Malignant neoplasm of upper lobe, right bronchus or lung: Secondary | ICD-10-CM

## 2018-12-20 DIAGNOSIS — E278 Other specified disorders of adrenal gland: Secondary | ICD-10-CM

## 2018-12-20 MED ORDER — MAGIC MOUTHWASH: 5.0000 mL | Freq: Four times a day (QID) | ORAL | 2 refills | Status: DC | PRN

## 2018-12-20 MED ORDER — OXYCODONE HCL 10 MG PO TABS: 10.0000 mg | ORAL_TABLET | Freq: Four times a day (QID) | ORAL | 0 refills | Status: DC | PRN

## 2018-12-20 MED ORDER — DRONABINOL 2.5 MG PO CAPS: 2.5000 mg | ORAL_CAPSULE | Freq: Two times a day (BID) | ORAL | 2 refills | Status: DC

## 2019-01-03 ENCOUNTER — Encounter (HOSPITAL_COMMUNITY): Payer: Self-pay | Admitting: Hematology

## 2019-01-03 ENCOUNTER — Other Ambulatory Visit: Payer: Self-pay

## 2019-01-03 ENCOUNTER — Inpatient Hospital Stay (HOSPITAL_COMMUNITY): Payer: Medicare HMO | Attending: Hematology

## 2019-01-03 ENCOUNTER — Inpatient Hospital Stay (HOSPITAL_BASED_OUTPATIENT_CLINIC_OR_DEPARTMENT_OTHER): Payer: Medicare HMO | Admitting: Hematology

## 2019-01-03 ENCOUNTER — Inpatient Hospital Stay (HOSPITAL_COMMUNITY): Payer: Medicare HMO

## 2019-01-03 VITALS — BP 124/69 | HR 79 | Temp 98.3°F | Resp 18

## 2019-01-03 VITALS — BP 126/65 | HR 98 | Temp 98.9°F | Resp 18 | Wt 99.4 lb

## 2019-01-03 DIAGNOSIS — R634 Abnormal weight loss: Secondary | ICD-10-CM | POA: Diagnosis not present

## 2019-01-03 DIAGNOSIS — C7971 Secondary malignant neoplasm of right adrenal gland: Secondary | ICD-10-CM | POA: Insufficient documentation

## 2019-01-03 DIAGNOSIS — Z5112 Encounter for antineoplastic immunotherapy: Secondary | ICD-10-CM | POA: Diagnosis not present

## 2019-01-03 DIAGNOSIS — E278 Other specified disorders of adrenal gland: Secondary | ICD-10-CM

## 2019-01-03 DIAGNOSIS — I129 Hypertensive chronic kidney disease with stage 1 through stage 4 chronic kidney disease, or unspecified chronic kidney disease: Secondary | ICD-10-CM | POA: Insufficient documentation

## 2019-01-03 DIAGNOSIS — C3411 Malignant neoplasm of upper lobe, right bronchus or lung: Secondary | ICD-10-CM

## 2019-01-03 DIAGNOSIS — Z79899 Other long term (current) drug therapy: Secondary | ICD-10-CM | POA: Insufficient documentation

## 2019-01-03 DIAGNOSIS — N189 Chronic kidney disease, unspecified: Secondary | ICD-10-CM | POA: Diagnosis not present

## 2019-01-03 DIAGNOSIS — C7951 Secondary malignant neoplasm of bone: Secondary | ICD-10-CM

## 2019-01-03 DIAGNOSIS — E032 Hypothyroidism due to medicaments and other exogenous substances: Secondary | ICD-10-CM

## 2019-01-03 DIAGNOSIS — E1122 Type 2 diabetes mellitus with diabetic chronic kidney disease: Secondary | ICD-10-CM | POA: Diagnosis not present

## 2019-01-03 DIAGNOSIS — R69 Illness, unspecified: Secondary | ICD-10-CM | POA: Diagnosis not present

## 2019-01-03 DIAGNOSIS — F17210 Nicotine dependence, cigarettes, uncomplicated: Secondary | ICD-10-CM | POA: Diagnosis not present

## 2019-01-03 LAB — CBC WITH DIFFERENTIAL/PLATELET
Abs Immature Granulocytes: 0.02 10*3/uL (ref 0.00–0.07)
Basophils Absolute: 0 10*3/uL (ref 0.0–0.1)
Basophils Relative: 0 %
EOS ABS: 0.2 10*3/uL (ref 0.0–0.5)
Eosinophils Relative: 2 %
HCT: 38.9 % (ref 36.0–46.0)
Hemoglobin: 12.1 g/dL (ref 12.0–15.0)
IMMATURE GRANULOCYTES: 0 %
Lymphocytes Relative: 25 %
Lymphs Abs: 2.5 10*3/uL (ref 0.7–4.0)
MCH: 28.4 pg (ref 26.0–34.0)
MCHC: 31.1 g/dL (ref 30.0–36.0)
MCV: 91.3 fL (ref 80.0–100.0)
Monocytes Absolute: 0.7 10*3/uL (ref 0.1–1.0)
Monocytes Relative: 7 %
Neutro Abs: 6.4 10*3/uL (ref 1.7–7.7)
Neutrophils Relative %: 66 %
Platelets: 294 10*3/uL (ref 150–400)
RBC: 4.26 MIL/uL (ref 3.87–5.11)
RDW: 16.8 % — ABNORMAL HIGH (ref 11.5–15.5)
WBC: 9.8 10*3/uL (ref 4.0–10.5)
nRBC: 0 % (ref 0.0–0.2)

## 2019-01-03 LAB — COMPREHENSIVE METABOLIC PANEL
ALT: 11 U/L (ref 0–44)
AST: 16 U/L (ref 15–41)
Albumin: 3.8 g/dL (ref 3.5–5.0)
Alkaline Phosphatase: 64 U/L (ref 38–126)
Anion gap: 8 (ref 5–15)
BUN: 15 mg/dL (ref 8–23)
CO2: 26 mmol/L (ref 22–32)
Calcium: 9.8 mg/dL (ref 8.9–10.3)
Chloride: 106 mmol/L (ref 98–111)
Creatinine, Ser: 1.2 mg/dL — ABNORMAL HIGH (ref 0.44–1.00)
GFR calc Af Amer: 56 mL/min — ABNORMAL LOW (ref >60)
GFR, EST NON AFRICAN AMERICAN: 48 mL/min — AB (ref >60)
GLUCOSE: 80 mg/dL (ref 70–99)
Potassium: 3.5 mmol/L (ref 3.5–5.1)
Sodium: 140 mmol/L (ref 135–145)
Total Bilirubin: 0.3 mg/dL (ref 0.3–1.2)
Total Protein: 7.6 g/dL (ref 6.5–8.1)

## 2019-01-03 LAB — TSH: TSH: 3.161 u[IU]/mL (ref 0.350–4.500)

## 2019-01-03 MED ORDER — HEPARIN SOD (PORK) LOCK FLUSH 100 UNIT/ML IV SOLN
500.0000 [IU] | Freq: Once | INTRAVENOUS | Status: AC | PRN
  Administered 2019-01-03: 500 [IU]

## 2019-01-03 MED ORDER — SODIUM CHLORIDE 0.9 % IV SOLN
Freq: Once | INTRAVENOUS | Status: AC
  Administered 2019-01-03: 10:00:00 via INTRAVENOUS

## 2019-01-03 MED ORDER — SODIUM CHLORIDE 0.9 % IV SOLN
480.0000 mg | Freq: Once | INTRAVENOUS | Status: AC
  Administered 2019-01-03: 480 mg via INTRAVENOUS
  Filled 2019-01-03: qty 48

## 2019-01-03 MED ORDER — SODIUM CHLORIDE 0.9% FLUSH
10.0000 mL | INTRAVENOUS | Status: AC | PRN
  Administered 2019-01-03: 10 mL
  Filled 2019-01-03: qty 10

## 2019-01-03 NOTE — Assessment & Plan Note (Signed)
1.  Metastatic poorly differentiated adenocarcinoma of the lung to the bones and right adrenal: -Foundation 1 testing with 14 genomic alterations, no approved FDA therapies and the patient tumor type - On opdivo 480 mg monthly started on 02/25/2015 -CT CAP on 06/07/2018 showed stable right upper lobe lung mass and right adrenal meta stasis.  - CT scan on 10/04/2018 showed stable pleural parenchymal thickening of the right lung apex.  Stable right adrenal gland noted.  - She is tolerating Opdivo very well.  She does not have any immunotherapy related side effects. -We have reviewed her labs today which are grossly within normal limits. -She may proceed with her Opdivo today.  I will see her back in 4 weeks for follow-up.  I plan to repeat CT scans prior to next visit.  2.  CKD: -Her creatinine is 1.2 and stable.  3.  Right shoulder pain and pain in the back of the legs: -She is taking oxycodone 0-1 times per day as needed.    4.  Bone metastasis: -Unfortunately she developed osteonecrosis of the mandible. -We will hold her denosumab indefinitely.  She will continue calcium and vitamin D supplements.   5.  Weight loss:  -She will continue Marinol 2.5 mg twice daily.  She is drinking 4 cans of boost per day and eating 2 meals per day.  Her weight has improved.

## 2019-01-03 NOTE — Patient Instructions (Signed)
[facility] Discharge Instructions for Patients Receiving Chemotherapy  Today you received the following chemotherapy agents   To help prevent nausea and vomiting after your treatment, we encourage you to take your nausea medication   If you develop nausea and vomiting that is not controlled by your nausea medication, call the clinic.   BELOW ARE SYMPTOMS THAT SHOULD BE REPORTED IMMEDIATELY:  *FEVER GREATER THAN 100.5 F  *CHILLS WITH OR WITHOUT FEVER  NAUSEA AND VOMITING THAT IS NOT CONTROLLED WITH YOUR NAUSEA MEDICATION  *UNUSUAL SHORTNESS OF BREATH  *UNUSUAL BRUISING OR BLEEDING  TENDERNESS IN MOUTH AND THROAT WITH OR WITHOUT PRESENCE OF ULCERS  *URINARY PROBLEMS  *BOWEL PROBLEMS  UNUSUAL RASH Items with * indicate a potential emergency and should be followed up as soon as possible.  Feel free to call the clinic should you have any questions or concerns. The clinic phone number is [phone].  Please show the CHEMO ALERT CARD at check-in to the Emergency Department and triage nurse.   

## 2019-01-03 NOTE — Progress Notes (Signed)
Melissa Sullivan, [place] [address]   CLINIC:  Medical Oncology/Hematology  PCP:  Melissa Squibb, MD [place] [address] [phone]   REASON FOR VISIT: Follow-up for metastatic poorly differentiated adenocarcinoma of the lung to the bones and right adrenal  CURRENT THERAPY:Opidvo every 4 weeks  BRIEF ONCOLOGIC HISTORY:    Cancer of upper lobe of right lung ([place])   07/28/2014 Imaging    CT chest: Large R apical mass consistent with malignancy. This is destroying the R 2nd rib with extension into adjacent soft tissue. R hilar adenopathy with R 5cm adrenal metastatic lesion.    08/01/2014 Initial Biopsy    Lung, needle/core biopsy(ies), right upper lobe - POORLY DIFFERENTIATED ADENOCARCINOMA, SEE COMMENT.    08/08/2014 PET scan    Large hypermetabolic R apical mass with evidence of direct chest wall and mediastinal invasion, right retrocrural lymphadenopathy, extensive retroperitoneal lymphadenopathy, and metastatic lesions to the adrenal glands     09/02/2014 - 11/04/2014 Chemotherapy    Cisplatin/Pemetrexed/Avastin every 21 days x 4 cycles    10/07/2014 - 10/27/2014 Radiation Therapy    Right lung apex for control of brachioplexopathy.    12/24/2014 - 02/25/2015 Chemotherapy    Alimta/Avastin every 21 days.    02/20/2015 Imaging    Increase in size of right adrenal metastasis and subjacent confluent retrocaval lymphadenopathy    02/25/2015 -  Chemotherapy    Nivolumab, zometa    05/04/2015 Imaging    CT CAP- Stable to slight decrease in the posterior right apical lesion. Stable appearance of posterior right upper rib an upper thoracic bony lesions. Slight improvement in right upper lobe tree-in-bud opacity. No new or progressive findings in...    07/28/2015 Imaging    CT CAP- Reduced size of the right apical pleural parenchymal lesion and reduced size of the right adrenal metastatic lesion. Resolution of prior retrocrural  adenopathy.  Right eccentric T1 and T2 sclerosis with sclerosis and tapering of the right second..    11/17/2015 Imaging    CT CAP- Stable soft tissue thickening in the apex of the right hemi thorax. Stable right adrenal metastasis. Nodularity along the trachea and mainstem bronchi, relatively new from 07/28/2015, favoring adherent debris.    11/18/2015 Treatment Plan Change    Zometa HELD for upcoming tooth extraction    11/24/2015 Treatment Plan Change    Zometa on hold at this time in preparation for tooth extraction in March 2017.  Zometa las given on 11/18/2015.    02/03/2016 Imaging    CT CAP- Heterogeneous right apical masslike consolidation and right adrenal metastasis are unchanged    04/06/2016 Treatment Plan Change    Zometa restarted 6 weeks out from tooth extraction (04/06/2016)    05/12/2016 Imaging    CT CAP- NED in the chest, abdomen or pelvis.  Some areas of nodularity associated with the mainstem bronchi in the left upper lobe bronchus, favored to represent adherent inspissated secretions    08/17/2016 Imaging    CT CAP- 1. Stable CTs of the chest and abdomen. No evidence of progressive metastatic disease. 2. Probable treated tumor at the right apex, right adrenal gland and T2 vertebral body, stable. 3. Fluctuating nodularity along the walls of the trachea and mainstem bronchi, likely secretions.    12/12/2016 Imaging    Further decrease in size of treated tumor within the right apex. 2. Stable treated tumor involving the right second rib and T2 vertebra. 3. Stable right  adrenal gland treated tumor. 4. Emphysema 5. Aortic atherosclerosis    01/04/2017 Imaging    MRI brain- Normal brain MRI.  No intracranial metastatic disease.    03/15/2017 Imaging    CT CAP- 1. No new or progressive metastatic disease in the chest or abdomen. 2. Stable treated tumor in the apical right upper lobe. Stable treated right posterior second rib and right T2 vertebral lesions. Stable  treated right adrenal metastasis. 3. Aortic atherosclerosis. 4. Moderate emphysema with mild diffuse bronchial wall thickening, suggesting COPD.    06/12/2017 Imaging    CT CAP 1. Similar appearance of treated primary within the right apex. 2. Similar areas of sclerosis within the right second rib, T2, and less so T1 vertebral bodies. These are most consistent with treated metastasis. 3. Similar right adrenal treated metastasis. 4. No evidence of new or progressive disease. 5. Similar right and progressive left areas of bronchial wall thickening and probable mucoid impaction. Correlate with interval infectious symptoms. 6.  Emphysema (ICD10-J43.9). 7. Coronary artery atherosclerosis. Aortic Atherosclerosis (ICD10-I70.0).     10/16/2017 Imaging    CT CAP 1. Stable appearance of the prior [place] tumor and related bony findings in the right second rib and right T1 and T2 vertebra compatible with successfully treated tumor. No significant enlargement or new lesions are identified. Similarly the treated right adrenal metastatic lesion is stable in appearance. 2. Upper normal size right hilar lymph node may warrant surveillance. Currently 9 mm in short axis. 3. Other imaging findings of potential clinical significance: Aortic Atherosclerosis (ICD10-I70.0) and Emphysema (ICD10-J43.9). Scattered proximal sigmoid colon diverticula. Mucus plugging medially in the left upper lobe and posteriorly in the right upper lobe.      INTERVAL HISTORY:  [name] 63 y.o. female returns for routine follow-up for metastatic poorly differentiated adenocarcinoma of the lung to the bones and right adrenal. She is tolerating treatment well. She reports she gained a few pounds after her last treatment. She has ben drinking boost daily. She has no other complaints at this time and ready for her next treatment. Denies any nausea, vomiting, or diarrhea. Denies any new pains. Had not noticed any recent  bleeding such as epistaxis, hematuria or hematochezia. Denies recent chest pain on exertion, shortness of breath on minimal exertion, pre-syncopal episodes, or palpitations. Denies any numbness or tingling in hands or feet. Denies any recent fevers, infections, or recent hospitalizations. Patient reports appetite at 100% and energy level at 100%.    REVIEW OF SYSTEMS:  Review of Systems  All other systems reviewed and are negative.    PAST MEDICAL/SURGICAL HISTORY:  Past Medical History:  Diagnosis Date  . Adrenal mass, right ([place]) 07/28/2014  . Anemia   . Bone metastases ([place]) 04/05/2016  . Cancer ([place])    lung  right  . Diabetes mellitus without complication ([place])   . GERD (gastroesophageal reflux disease)   . Hyperlipidemia   . Hypertension   . Hypothyroidism due to medication 01/30/2017  . Lung mass 07/28/2014  . Reflux    Past Surgical History:  Procedure Laterality Date  . AMPUTATION Right 02/22/2017   Procedure: PARTIAL AMPUTATION RIGHT GREAT TOE;  Surgeon: Caprice Beaver, DPM;  Location: [facility];  Service: Podiatry;  Laterality: Right;  . APPENDECTOMY    . ESOPHAGOGASTRODUODENOSCOPY N/A 12/09/2014   QVZ:DGLOVF esophageal stricture/mild-to-noderate erosive gastritis. negative H.pylori  . FLEXIBLE SIGMOIDOSCOPY  2011   Dr. [name]: hyperplastic polyp  . LUNG BIOPSY Right 07/2014   CT guided  . MALONEY DILATION  N/A 12/09/2014   Procedure: [name] DILATION;  Surgeon: [name], MD;  Location: [facility];  Service: Endoscopy;  Laterality: N/A;  . PORTACATH PLACEMENT Left 09/01/2014  . SAVORY DILATION N/A 12/09/2014   Procedure: SAVORY DILATION;  Surgeon: [name], MD;  Location: [facility];  Service: Endoscopy;  Laterality: N/A;     SOCIAL HISTORY:  Social History   Socioeconomic History  . Marital status: Married    Spouse name: Not on file  . Number of children: Not on file  . Years of education: Not on file  . Highest education level: Not on file    Occupational History  . Not on file  Social Needs  . Financial resource strain: Not on file  . Food insecurity:    Worry: Not on file    Inability: Not on file  . Transportation needs:    Medical: Not on file    Non-medical: Not on file  Tobacco Use  . Smoking status: Light Tobacco Smoker    Packs/day: 0.30    Years: 20.00    Pack years: 6.00    Types: Cigarettes  . Smokeless tobacco: Never Used  Substance and Sexual Activity  . Alcohol use: No  . Drug use: No  . Sexual activity: Not on file  Lifestyle  . Physical activity:    Days per week: Not on file    Minutes per session: Not on file  . Stress: Not on file  Relationships  . Social connections:    Talks on phone: Not on file    Gets together: Not on file    Attends religious service: Not on file    Active member of club or organization: Not on file    Attends meetings of clubs or organizations: Not on file    Relationship status: Not on file  . Intimate partner violence:    Fear of current or ex partner: Not on file    Emotionally abused: Not on file    Physically abused: Not on file    Forced sexual activity: Not on file  Other Topics Concern  . Not on file  Social History Narrative  . Not on file    FAMILY HISTORY:  Family History  Problem Relation Age of Onset  . Cancer Sister   . Colon cancer Neg Hx     CURRENT MEDICATIONS:  Outpatient Encounter Medications as of 01/03/2019  Medication Sig Note  . amoxicillin (AMOXIL) 500 MG capsule    . Calcium Carb-Cholecalciferol (CALCIUM 1000 + D PO) Take 1 tablet by mouth daily.   . chlorhexidine (PERIDEX) 0.12 % solution    . denosumab (XGEVA) 120 MG/1.7ML SOLN injection Inject 120 mg into the skin every 28 (twenty-eight) days. 11/08/2018: Hold per MD until dental work is complete.   . dronabinol (MARINOL) 2.5 MG capsule Take 1 capsule (2.5 mg total) by mouth 2 (two) times daily before lunch and supper.   Marland Kitchen ENSURE (ENSURE) Take 1 Can by mouth 4 (four) times  daily.   . Fluticasone-Salmeterol (ADVAIR DISKUS) 500-50 MCG/DOSE AEPB Inhale 1 puff into the lungs 2 (two) times daily. (Patient taking differently: Inhale 1 puff into the lungs 2 (two) times daily as needed (for respiratory issues.). )   . ibuprofen (ADVIL,MOTRIN) 200 MG tablet Take 400 mg by mouth every 8 (eight) hours as needed (for pain.).   Marland Kitchen levothyroxine (SYNTHROID, LEVOTHROID) 25 MCG tablet TAKE 1/2 (ONE-HALF) TABLET BY MOUTH ONCE DAILY BEFORE BREAKFAST   .  lidocaine-prilocaine (EMLA) cream APPLY A QUARTER SIZE AMOUNT TO PORT SITE 1 HOUR PRIOR TO CHEMO.  DO NOT RUB IN.  COVER WITH PLASTIC WRAP   . Nivolumab (OPDIVO IV) Inject into the vein every 28 (twenty-eight) days.   Marland Kitchen omeprazole (PRILOSEC) 40 MG capsule Take 1 capsule (40 mg total) by mouth daily. TAKE 30 MINS PRIOR TO YOUR FIRST MEAL.   . simvastatin (ZOCOR) 40 MG tablet TAKE ONE TABLET BY MOUTH ONCE DAILY IN THE MORNING   . magic mouthwash SOLN Take 5 mLs by mouth 4 (four) times daily as needed for mouth pain. (Patient not taking: Reported on 01/03/2019)   . ondansetron (ZOFRAN) 8 MG tablet Take 1 tablet (8 mg total) by mouth every 8 (eight) hours as needed for nausea or vomiting. (Patient not taking: Reported on 01/03/2019)   . Oxycodone HCl 10 MG TABS Take 1-2 tablets (10-20 mg total) by mouth every 6 (six) hours as needed (pain.). (Patient not taking: Reported on 01/03/2019)    No facility-administered encounter medications on file as of 01/03/2019.     ALLERGIES:  Allergies  Allergen Reactions  . Xgeva [Denosumab]     osteonecrosis     PHYSICAL EXAM:  ECOG Performance status: 1  Vitals:   01/03/19 0800  BP: 126/65  Pulse: 98  Resp: 18  Temp: 98.9 F (37.2 C)  SpO2: 100%   Filed Weights   01/03/19 0800  Weight: 99 lb 6 oz (45.1 kg)    Physical Exam Constitutional:      Appearance: Normal appearance. She is normal weight.  Cardiovascular:     Rate and Rhythm: Normal rate and regular rhythm.     Heart sounds:  Normal heart sounds.  Pulmonary:     Effort: Pulmonary effort is normal.     Breath sounds: Normal breath sounds.  Musculoskeletal: Normal range of motion.  Skin:    General: Skin is warm and dry.  Neurological:     Mental Status: She is alert and oriented to person, place, and time. Mental status is at baseline.  Psychiatric:        Mood and Affect: Mood normal.        Behavior: Behavior normal.        Judgment: Judgment normal.      LABORATORY DATA:  I have reviewed the labs as listed.  CBC    Component Value Date/Time   WBC 9.8 01/03/2019 0821   RBC 4.26 01/03/2019 0821   HGB 12.1 01/03/2019 0821   HCT 38.9 01/03/2019 0821   PLT 294 01/03/2019 0821   MCV 91.3 01/03/2019 0821   MCH 28.4 01/03/2019 0821   MCHC 31.1 01/03/2019 0821   RDW 16.8 (H) 01/03/2019 0821   LYMPHSABS 2.5 01/03/2019 0821   MONOABS 0.7 01/03/2019 0821   EOSABS 0.2 01/03/2019 0821   BASOSABS 0.0 01/03/2019 0821   CMP Latest Ref Rng & Units 01/03/2019 12/06/2018 11/08/2018  Glucose 70 - 99 mg/dL 80 118(H) 92  BUN 8 - 23 mg/dL 15 11 12   Creatinine 0.44 - 1.00 mg/dL 1.20(H) 1.09(H) 1.13(H)  Sodium 135 - 145 mmol/L 140 136 139  Potassium 3.5 - 5.1 mmol/L 3.5 4.0 3.9  Chloride 98 - 111 mmol/L 106 104 105  CO2 22 - 32 mmol/L 26 25 27   Calcium 8.9 - 10.3 mg/dL 9.8 9.9 10.2  Total Protein 6.5 - 8.1 g/dL 7.6 8.5(H) 7.9  Total Bilirubin 0.3 - 1.2 mg/dL 0.3 0.3 0.2(L)  Alkaline Phos 38 - 126 U/L  64 62 68  AST 15 - 41 U/L 16 17 18   ALT 0 - 44 U/L 11 12 14        DIAGNOSTIC IMAGING:  I have independently reviewed the scans and discussed with the patient.   I have reviewed Francene Finders, NP's note and agree with the documentation.  I personally performed a face-to-face visit, made revisions and my assessment and plan is as follows.    ASSESSMENT & PLAN:   Cancer of upper lobe of right lung 1.  Metastatic poorly differentiated adenocarcinoma of the lung to the bones and right adrenal: -Foundation  1 testing with 14 genomic alterations, no approved FDA therapies and the patient tumor type - On opdivo 480 mg monthly started on 02/25/2015 -CT CAP on 06/07/2018 showed stable right upper lobe lung mass and right adrenal meta stasis.  - CT scan on 10/04/2018 showed stable pleural parenchymal thickening of the right lung apex.  Stable right adrenal gland noted.  - She is tolerating Opdivo very well.  She does not have any immunotherapy related side effects. -We have reviewed her labs today which are grossly within normal limits. -She may proceed with her Opdivo today.  I will see her back in 4 weeks for follow-up.  I plan to repeat CT scans prior to next visit.  2.  CKD: -Her creatinine is 1.2 and stable.  3.  Right shoulder pain and pain in the back of the legs: -She is taking oxycodone 0-1 times per day as needed.    4.  Bone metastasis: -Unfortunately she developed osteonecrosis of the mandible. -We will hold her denosumab indefinitely.  She will continue calcium and vitamin D supplements.   5.  Weight loss:  -She will continue Marinol 2.5 mg twice daily.  She is drinking 4 cans of boost per day and eating 2 meals per day.  Her weight has improved.       Orders placed this encounter:  Orders Placed This Encounter  Procedures  . CT Chest W Contrast  . CT Abdomen Pelvis W Contrast  . Lactate dehydrogenase  . TSH  . CBC with Differential/Platelet  . Comprehensive metabolic panel      Derek Jack, MD [place] [phone]

## 2019-01-03 NOTE — Progress Notes (Unsigned)
Labs reviewed with MD today at office visit. Proceed with treatment today.   Treatment given per orders. Patient tolerated it well without problems. Vitals stable and discharged home from clinic ambulatory. Follow up as scheduled.

## 2019-01-03 NOTE — Patient Instructions (Signed)
[place] at [facility] Discharge Instructions  Follow up in 4 weeks with labs and scans and treatment   Thank you for choosing [place] at [facility] to provide your oncology and hematology care.  To afford each patient quality time with our provider, please arrive at least 15 minutes before your scheduled appointment time.   If you have a lab appointment with the [facility] please come in thru the  Main Entrance and check in at the main information desk  You need to re-schedule your appointment should you arrive 10 or more minutes late.  We strive to give you quality time with our providers, and arriving late affects you and other patients whose appointments are after yours.  Also, if you no show three or more times for appointments you may be dismissed from the clinic at the providers discretion.     Again, thank you for choosing [facility].  Our hope is that these requests will decrease the amount of time that you wait before being seen by our physicians.       _____________________________________________________________  Should you have questions after your visit to [facility], please contact our office at [phone] between the hours of 8:00 a.m. and 4:30 p.m.  Voicemails left after 4:00 p.m. will not be returned until the following business day.  For prescription refill requests, have your pharmacy contact our office and allow 72 hours.    Cancer Center Support Programs:   > Cancer Support Group  2nd Tuesday of the month 1pm-2pm, Journey Room

## 2019-01-11 DIAGNOSIS — B351 Tinea unguium: Secondary | ICD-10-CM | POA: Diagnosis not present

## 2019-01-11 DIAGNOSIS — G629 Polyneuropathy, unspecified: Secondary | ICD-10-CM | POA: Diagnosis not present

## 2019-01-11 DIAGNOSIS — E1136 Type 2 diabetes mellitus with diabetic cataract: Secondary | ICD-10-CM | POA: Diagnosis not present

## 2019-01-11 DIAGNOSIS — J449 Chronic obstructive pulmonary disease, unspecified: Secondary | ICD-10-CM | POA: Diagnosis not present

## 2019-01-11 DIAGNOSIS — G8929 Other chronic pain: Secondary | ICD-10-CM | POA: Diagnosis not present

## 2019-01-11 DIAGNOSIS — R69 Illness, unspecified: Secondary | ICD-10-CM | POA: Diagnosis not present

## 2019-01-11 DIAGNOSIS — E46 Unspecified protein-calorie malnutrition: Secondary | ICD-10-CM | POA: Diagnosis not present

## 2019-01-11 DIAGNOSIS — C3490 Malignant neoplasm of unspecified part of unspecified bronchus or lung: Secondary | ICD-10-CM | POA: Diagnosis not present

## 2019-01-11 DIAGNOSIS — E11630 Type 2 diabetes mellitus with periodontal disease: Secondary | ICD-10-CM | POA: Diagnosis not present

## 2019-01-11 DIAGNOSIS — E039 Hypothyroidism, unspecified: Secondary | ICD-10-CM | POA: Diagnosis not present

## 2019-01-11 DIAGNOSIS — K08409 Partial loss of teeth, unspecified cause, unspecified class: Secondary | ICD-10-CM | POA: Diagnosis not present

## 2019-01-11 DIAGNOSIS — E785 Hyperlipidemia, unspecified: Secondary | ICD-10-CM | POA: Diagnosis not present

## 2019-01-16 DIAGNOSIS — H524 Presbyopia: Secondary | ICD-10-CM | POA: Diagnosis not present

## 2019-01-16 DIAGNOSIS — Z0100 Encounter for examination of eyes and vision without abnormal findings: Secondary | ICD-10-CM | POA: Diagnosis not present

## 2019-01-28 ENCOUNTER — Ambulatory Visit (HOSPITAL_COMMUNITY)
Admission: RE | Admit: 2019-01-28 | Discharge: 2019-01-28 | Disposition: A | Discharge status: Home / Self Care | Payer: Medicare HMO | Source: Ambulatory Visit | Attending: Nurse Practitioner | Admitting: Nurse Practitioner

## 2019-01-28 DIAGNOSIS — C3411 Malignant neoplasm of upper lobe, right bronchus or lung: Secondary | ICD-10-CM | POA: Insufficient documentation

## 2019-01-28 DIAGNOSIS — J432 Centrilobular emphysema: Secondary | ICD-10-CM | POA: Diagnosis not present

## 2019-01-28 DIAGNOSIS — K5730 Diverticulosis of large intestine without perforation or abscess without bleeding: Secondary | ICD-10-CM | POA: Diagnosis not present

## 2019-01-28 MED ORDER — IOHEXOL 300 MG/ML  SOLN
100.0000 mL | Freq: Once | INTRAMUSCULAR | Status: AC | PRN
  Administered 2019-01-28: 100 mL via INTRAVENOUS

## 2019-01-31 ENCOUNTER — Inpatient Hospital Stay (HOSPITAL_COMMUNITY): Payer: Medicare HMO

## 2019-01-31 ENCOUNTER — Encounter (HOSPITAL_COMMUNITY): Payer: Self-pay

## 2019-01-31 ENCOUNTER — Inpatient Hospital Stay (HOSPITAL_BASED_OUTPATIENT_CLINIC_OR_DEPARTMENT_OTHER): Payer: Medicare HMO | Admitting: Hematology

## 2019-01-31 ENCOUNTER — Other Ambulatory Visit: Payer: Self-pay

## 2019-01-31 ENCOUNTER — Inpatient Hospital Stay (HOSPITAL_COMMUNITY): Payer: Medicare HMO | Attending: Hematology

## 2019-01-31 ENCOUNTER — Encounter (HOSPITAL_COMMUNITY): Payer: Self-pay | Admitting: Hematology

## 2019-01-31 VITALS — BP 110/59 | HR 83 | Temp 98.8°F | Resp 20 | Wt 97.2 lb

## 2019-01-31 DIAGNOSIS — C3411 Malignant neoplasm of upper lobe, right bronchus or lung: Secondary | ICD-10-CM

## 2019-01-31 DIAGNOSIS — C7951 Secondary malignant neoplasm of bone: Secondary | ICD-10-CM

## 2019-01-31 DIAGNOSIS — E278 Other specified disorders of adrenal gland: Secondary | ICD-10-CM

## 2019-01-31 DIAGNOSIS — F17210 Nicotine dependence, cigarettes, uncomplicated: Secondary | ICD-10-CM

## 2019-01-31 DIAGNOSIS — R634 Abnormal weight loss: Secondary | ICD-10-CM

## 2019-01-31 DIAGNOSIS — Z5112 Encounter for antineoplastic immunotherapy: Secondary | ICD-10-CM | POA: Diagnosis present

## 2019-01-31 DIAGNOSIS — E032 Hypothyroidism due to medicaments and other exogenous substances: Secondary | ICD-10-CM

## 2019-01-31 DIAGNOSIS — C7971 Secondary malignant neoplasm of right adrenal gland: Secondary | ICD-10-CM | POA: Insufficient documentation

## 2019-01-31 DIAGNOSIS — R69 Illness, unspecified: Secondary | ICD-10-CM | POA: Diagnosis not present

## 2019-01-31 DIAGNOSIS — N189 Chronic kidney disease, unspecified: Secondary | ICD-10-CM

## 2019-01-31 LAB — COMPREHENSIVE METABOLIC PANEL
ALK PHOS: 73 U/L (ref 38–126)
ALT: 12 U/L (ref 0–44)
AST: 18 U/L (ref 15–41)
Albumin: 3.9 g/dL (ref 3.5–5.0)
Anion gap: 8 (ref 5–15)
BUN: 14 mg/dL (ref 8–23)
CALCIUM: 10.1 mg/dL (ref 8.9–10.3)
CO2: 26 mmol/L (ref 22–32)
CREATININE: 1.24 mg/dL — AB (ref 0.44–1.00)
Chloride: 103 mmol/L (ref 98–111)
GFR calc Af Amer: 54 mL/min — ABNORMAL LOW (ref >60)
GFR calc non Af Amer: 47 mL/min — ABNORMAL LOW (ref >60)
Glucose, Bld: 92 mg/dL (ref 70–99)
Potassium: 4.1 mmol/L (ref 3.5–5.1)
SODIUM: 137 mmol/L (ref 135–145)
Total Bilirubin: 0.4 mg/dL (ref 0.3–1.2)
Total Protein: 7.9 g/dL (ref 6.5–8.1)

## 2019-01-31 LAB — CBC WITH DIFFERENTIAL/PLATELET
ABS IMMATURE GRANULOCYTES: 0.01 10*3/uL (ref 0.00–0.07)
BASOS PCT: 0 %
Basophils Absolute: 0 10*3/uL (ref 0.0–0.1)
Eosinophils Absolute: 0.1 10*3/uL (ref 0.0–0.5)
Eosinophils Relative: 1 %
HCT: 39.8 % (ref 36.0–46.0)
HEMOGLOBIN: 12.5 g/dL (ref 12.0–15.0)
Immature Granulocytes: 0 %
Lymphocytes Relative: 24 %
Lymphs Abs: 2.1 10*3/uL (ref 0.7–4.0)
MCH: 28.9 pg (ref 26.0–34.0)
MCHC: 31.4 g/dL (ref 30.0–36.0)
MCV: 92.1 fL (ref 80.0–100.0)
Monocytes Absolute: 0.7 10*3/uL (ref 0.1–1.0)
Monocytes Relative: 9 %
Neutro Abs: 5.5 10*3/uL (ref 1.7–7.7)
Neutrophils Relative %: 66 %
PLATELETS: 291 10*3/uL (ref 150–400)
RBC: 4.32 MIL/uL (ref 3.87–5.11)
RDW: 16.7 % — ABNORMAL HIGH (ref 11.5–15.5)
WBC: 8.5 10*3/uL (ref 4.0–10.5)
nRBC: 0 % (ref 0.0–0.2)

## 2019-01-31 LAB — URINALYSIS, DIPSTICK ONLY
BILIRUBIN URINE: NEGATIVE
Glucose, UA: NEGATIVE mg/dL
Hgb urine dipstick: NEGATIVE
Ketones, ur: NEGATIVE mg/dL
Leukocytes,Ua: NEGATIVE
Nitrite: NEGATIVE
Protein, ur: NEGATIVE mg/dL
Specific Gravity, Urine: 1.006 (ref 1.005–1.030)
pH: 8 (ref 5.0–8.0)

## 2019-01-31 LAB — LACTATE DEHYDROGENASE: LDH: 112 U/L (ref 98–192)

## 2019-01-31 MED ORDER — HEPARIN SOD (PORK) LOCK FLUSH 100 UNIT/ML IV SOLN
500.0000 [IU] | Freq: Once | INTRAVENOUS | Status: AC | PRN
  Administered 2019-01-31: 500 [IU]

## 2019-01-31 MED ORDER — SODIUM CHLORIDE 0.9 % IV SOLN
Freq: Once | INTRAVENOUS | Status: AC
  Administered 2019-01-31: 11:00:00 via INTRAVENOUS

## 2019-01-31 MED ORDER — SODIUM CHLORIDE 0.9% FLUSH
10.0000 mL | INTRAVENOUS | Status: DC | PRN
  Administered 2019-01-31: 10 mL
  Filled 2019-01-31: qty 10

## 2019-01-31 MED ORDER — SODIUM CHLORIDE 0.9 % IV SOLN
480.0000 mg | Freq: Once | INTRAVENOUS | Status: AC
  Administered 2019-01-31: 480 mg via INTRAVENOUS
  Filled 2019-01-31: qty 48

## 2019-01-31 NOTE — Progress Notes (Signed)
Melissa Sullivan Melissa Sullivan, [place] [address]   CLINIC:  Medical Oncology/Hematology  PCP:  Melissa Squibb, MD [place] [address] [phone]   REASON FOR VISIT:  Follow-up for metastatic lung cancer.  CURRENT THERAPY: Opdivo every 4 weeks.  BRIEF ONCOLOGIC HISTORY:    Cancer of upper lobe of right lung ([place])   07/28/2014 Imaging    CT chest: Large R apical mass consistent with malignancy. This is destroying the R 2nd rib with extension into adjacent soft tissue. R hilar adenopathy with R 5cm adrenal metastatic lesion.    08/01/2014 Initial Biopsy    Lung, needle/core biopsy(ies), right upper lobe - POORLY DIFFERENTIATED ADENOCARCINOMA, SEE COMMENT.    08/08/2014 PET scan    Large hypermetabolic R apical mass with evidence of direct chest wall and mediastinal invasion, right retrocrural lymphadenopathy, extensive retroperitoneal lymphadenopathy, and metastatic lesions to the adrenal glands     09/02/2014 - 11/04/2014 Chemotherapy    Cisplatin/Pemetrexed/Avastin every 21 days x 4 cycles    10/07/2014 - 10/27/2014 Radiation Therapy    Right lung apex for control of brachioplexopathy.    12/24/2014 - 02/25/2015 Chemotherapy    Alimta/Avastin every 21 days.    02/20/2015 Imaging    Increase in size of right adrenal metastasis and subjacent confluent retrocaval lymphadenopathy    02/25/2015 -  Chemotherapy    Nivolumab, zometa    05/04/2015 Imaging    CT CAP- Stable to slight decrease in the posterior right apical lesion. Stable appearance of posterior right upper rib an upper thoracic bony lesions. Slight improvement in right upper lobe tree-in-bud opacity. No new or progressive findings in...    07/28/2015 Imaging    CT CAP- Reduced size of the right apical pleural parenchymal lesion and reduced size of the right adrenal metastatic lesion. Resolution of prior retrocrural adenopathy.  Right eccentric T1 and T2 sclerosis with sclerosis and  tapering of the right second..    11/17/2015 Imaging    CT CAP- Stable soft tissue thickening in the apex of the right hemi thorax. Stable right adrenal metastasis. Nodularity along the trachea and mainstem bronchi, relatively new from 07/28/2015, favoring adherent debris.    11/18/2015 Treatment Plan Change    Zometa HELD for upcoming tooth extraction    11/24/2015 Treatment Plan Change    Zometa on hold at this time in preparation for tooth extraction in March 2017.  Zometa las given on 11/18/2015.    02/03/2016 Imaging    CT CAP- Heterogeneous right apical masslike consolidation and right adrenal metastasis are unchanged    04/06/2016 Treatment Plan Change    Zometa restarted 6 weeks out from tooth extraction (04/06/2016)    05/12/2016 Imaging    CT CAP- NED in the chest, abdomen or pelvis.  Some areas of nodularity associated with the mainstem bronchi in the left upper lobe bronchus, favored to represent adherent inspissated secretions    08/17/2016 Imaging    CT CAP- 1. Stable CTs of the chest and abdomen. No evidence of progressive metastatic disease. 2. Probable treated tumor at the right apex, right adrenal gland and T2 vertebral body, stable. 3. Fluctuating nodularity along the walls of the trachea and mainstem bronchi, likely secretions.    12/12/2016 Imaging    Further decrease in size of treated tumor within the right apex. 2. Stable treated tumor involving the right second rib and T2 vertebra. 3. Stable right adrenal gland treated tumor. 4. Emphysema 5. Aortic  atherosclerosis    01/04/2017 Imaging    MRI brain- Normal brain MRI.  No intracranial metastatic disease.    03/15/2017 Imaging    CT CAP- 1. No new or progressive metastatic disease in the chest or abdomen. 2. Stable treated tumor in the apical right upper lobe. Stable treated right posterior second rib and right T2 vertebral lesions. Stable treated right adrenal metastasis. 3. Aortic atherosclerosis. 4.  Moderate emphysema with mild diffuse bronchial wall thickening, suggesting COPD.    06/12/2017 Imaging    CT CAP 1. Similar appearance of treated primary within the right apex. 2. Similar areas of sclerosis within the right second rib, T2, and less so T1 vertebral bodies. These are most consistent with treated metastasis. 3. Similar right adrenal treated metastasis. 4. No evidence of new or progressive disease. 5. Similar right and progressive left areas of bronchial wall thickening and probable mucoid impaction. Correlate with interval infectious symptoms. 6.  Emphysema (ICD10-J43.9). 7. Coronary artery atherosclerosis. Aortic Atherosclerosis (ICD10-I70.0).     10/16/2017 Imaging    CT CAP 1. Stable appearance of the prior Pancoast tumor and related bony findings in the right second rib and right T1 and T2 vertebra compatible with successfully treated tumor. No significant enlargement or new lesions are identified. Similarly the treated right adrenal metastatic lesion is stable in appearance. 2. Upper normal size right hilar lymph node may warrant surveillance. Currently 9 mm in short axis. 3. Other imaging findings of potential clinical significance: Aortic Atherosclerosis (ICD10-I70.0) and Emphysema (ICD10-J43.9). Scattered proximal sigmoid colon diverticula. Mucus plugging medially in the left upper lobe and posteriorly in the right upper lobe.       CANCER STAGING: Cancer Staging No matching staging information was found for the patient.   INTERVAL HISTORY:  Melissa Sullivan 63 y.o. female returns for routine follow-up and consideration for next cycle of chemotherapy. She is here today with her friend. She states that her pain has gotten better. She states that she has been working part-time at [employer] but her daughter wants her to quit. She was educated on the importance of staying active. She states that she did have blood in her urine one day, but none since. Denies any  nausea, vomiting, or diarrhea. Denies any new pains. Had not noticed any recent bleeding such as epistaxis, or hematochezia. Denies recent chest pain on exertion, shortness of breath on minimal exertion, pre-syncopal episodes, or palpitations. Denies any numbness or tingling in hands or feet. Denies any recent fevers, infections, or recent hospitalizations. Patient reports appetite at 75% and energy level at 100%.  Overall, she feels ready for next cycle of chemo today.     REVIEW OF SYSTEMS:  Review of Systems - Oncology   PAST MEDICAL/SURGICAL HISTORY:  Past Medical History:  Diagnosis Date   Adrenal mass, right ([facility]) 07/28/2014   Anemia    Bone metastases ([facility]) 04/05/2016   Cancer ([facility])    lung  right   Diabetes mellitus without complication ([facility])    GERD (gastroesophageal reflux disease)    Hyperlipidemia    Hypertension    Hypothyroidism due to medication 01/30/2017   Lung mass 07/28/2014   Reflux    Past Surgical History:  Procedure Laterality Date   AMPUTATION Right 02/22/2017   Procedure: PARTIAL AMPUTATION RIGHT GREAT TOE;  Surgeon: Caprice Beaver, DPM;  Location: [facility];  Service: Podiatry;  Laterality: Right;   APPENDECTOMY     ESOPHAGOGASTRODUODENOSCOPY N/A 12/09/2014   [facility]:WCBJSE esophageal stricture/mild-to-noderate erosive gastritis. negative H.pylori  FLEXIBLE SIGMOIDOSCOPY  2011   Dr. [name]: hyperplastic polyp   LUNG BIOPSY Right 07/2014   CT guided   MALONEY DILATION N/A 12/09/2014   Procedure: MALONEY DILATION;  Surgeon: [name], MD;  Location: [facility];  Service: Endoscopy;  Laterality: N/A;   PORTACATH PLACEMENT Left 09/01/2014   SAVORY DILATION N/A 12/09/2014   Procedure: SAVORY DILATION;  Surgeon: [name], MD;  Location: [facility];  Service: Endoscopy;  Laterality: N/A;     SOCIAL HISTORY:  Social History   Socioeconomic History   Marital status: Married    Spouse name: Not on file   Number of children: Not  on file   Years of education: Not on file   Highest education level: Not on file  Occupational History   Not on file  Social Needs   Financial resource strain: Not on file   Food insecurity:    Worry: Not on file    Inability: Not on file   Transportation needs:    Medical: Not on file    Non-medical: Not on file  Tobacco Use   Smoking status: Light Tobacco Smoker    Packs/day: 0.30    Years: 20.00    Pack years: 6.00    Types: Cigarettes   Smokeless tobacco: Never Used  Substance and Sexual Activity   Alcohol use: No   Drug use: No   Sexual activity: Not on file  Lifestyle   Physical activity:    Days per week: Not on file    Minutes per session: Not on file   Stress: Not on file  Relationships   Social connections:    Talks on phone: Not on file    Gets together: Not on file    Attends religious service: Not on file    Active member of club or organization: Not on file    Attends meetings of clubs or organizations: Not on file    Relationship status: Not on file   Intimate partner violence:    Fear of current or ex partner: Not on file    Emotionally abused: Not on file    Physically abused: Not on file    Forced sexual activity: Not on file  Other Topics Concern   Not on file  Social History Narrative   Not on file    FAMILY HISTORY:  Family History  Problem Relation Age of Onset   Cancer Sister    Colon cancer Neg Hx     CURRENT MEDICATIONS:  Outpatient Encounter Medications as of 01/31/2019  Medication Sig Note   amoxicillin (AMOXIL) 500 MG capsule     Calcium Carb-Cholecalciferol (CALCIUM 1000 + D PO) Take 1 tablet by mouth daily.    chlorhexidine (PERIDEX) 0.12 % solution     denosumab (XGEVA) 120 MG/1.7ML SOLN injection Inject 120 mg into the skin every 28 (twenty-eight) days. 11/08/2018: Hold per MD until dental work is complete.    dronabinol (MARINOL) 2.5 MG capsule Take 1 capsule (2.5 mg total) by mouth 2 (two) times  daily before lunch and supper.    ENSURE (ENSURE) Take 1 Can by mouth 4 (four) times daily.    Fluticasone-Salmeterol (ADVAIR DISKUS) 500-50 MCG/DOSE AEPB Inhale 1 puff into the lungs 2 (two) times daily. (Patient taking differently: Inhale 1 puff into the lungs 2 (two) times daily as needed (for respiratory issues.). )    ibuprofen (ADVIL,MOTRIN) 200 MG tablet Take 400 mg by mouth every 8 (eight) hours as needed (  for pain.).    levothyroxine (SYNTHROID, LEVOTHROID) 25 MCG tablet TAKE 1/2 (ONE-HALF) TABLET BY MOUTH ONCE DAILY BEFORE BREAKFAST    lidocaine-prilocaine (EMLA) cream APPLY A QUARTER SIZE AMOUNT TO PORT SITE 1 HOUR PRIOR TO CHEMO.  DO NOT RUB IN.  COVER WITH PLASTIC WRAP    magic mouthwash SOLN Take 5 mLs by mouth 4 (four) times daily as needed for mouth pain. (Patient not taking: Reported on 01/03/2019)    Nivolumab (OPDIVO IV) Inject into the vein every 28 (twenty-eight) days.    omeprazole (PRILOSEC) 40 MG capsule Take 1 capsule (40 mg total) by mouth daily. TAKE 30 MINS PRIOR TO YOUR FIRST MEAL.    ondansetron (ZOFRAN) 8 MG tablet Take 1 tablet (8 mg total) by mouth every 8 (eight) hours as needed for nausea or vomiting. (Patient not taking: Reported on 01/03/2019)    Oxycodone HCl 10 MG TABS Take 1-2 tablets (10-20 mg total) by mouth every 6 (six) hours as needed (pain.). (Patient not taking: Reported on 01/03/2019)    simvastatin (ZOCOR) 40 MG tablet TAKE ONE TABLET BY MOUTH ONCE DAILY IN THE MORNING    Facility-Administered Encounter Medications as of 01/31/2019  Medication   sodium chloride flush (NS) 0.9 % injection 10 mL    ALLERGIES:  Allergies  Allergen Reactions   Xgeva [Denosumab]     osteonecrosis     PHYSICAL EXAM:  ECOG Performance status: 1  I have reviewed her vitals.  Blood pressure is 128/62, pulse rate is 77, respiratory rate is 20, temperature 98.8.  Oxygen saturation is 100%.  Physical Exam Constitutional:      Appearance: Normal  appearance.  Cardiovascular:     Rate and Rhythm: Normal rate and regular rhythm.  Pulmonary:     Effort: Pulmonary effort is normal.     Breath sounds: Normal breath sounds.  Abdominal:     General: Bowel sounds are normal. There is no distension.     Palpations: Abdomen is soft.  Musculoskeletal:        General: No swelling.  Skin:    General: Skin is warm.  Neurological:     General: No focal deficit present.     Mental Status: She is alert and oriented to person, place, and time.  Psychiatric:        Mood and Affect: Mood normal.        Behavior: Behavior normal.      LABORATORY DATA:  I have reviewed the labs as listed.  CBC    Component Value Date/Time   WBC 8.5 01/31/2019 0755   RBC 4.32 01/31/2019 0755   HGB 12.5 01/31/2019 0755   HCT 39.8 01/31/2019 0755   PLT 291 01/31/2019 0755   MCV 92.1 01/31/2019 0755   MCH 28.9 01/31/2019 0755   MCHC 31.4 01/31/2019 0755   RDW 16.7 (H) 01/31/2019 0755   LYMPHSABS 2.1 01/31/2019 0755   MONOABS 0.7 01/31/2019 0755   EOSABS 0.1 01/31/2019 0755   BASOSABS 0.0 01/31/2019 0755   CMP Latest Ref Rng & Units 01/31/2019 01/03/2019 12/06/2018  Glucose 70 - 99 mg/dL 92 80 118(H)  BUN 8 - 23 mg/dL 14 15 11   Creatinine 0.44 - 1.00 mg/dL 1.24(H) 1.20(H) 1.09(H)  Sodium 135 - 145 mmol/L 137 140 136  Potassium 3.5 - 5.1 mmol/L 4.1 3.5 4.0  Chloride 98 - 111 mmol/L 103 106 104  CO2 22 - 32 mmol/L 26 26 25   Calcium 8.9 - 10.3 mg/dL 10.1 9.8 9.9  Total  Protein 6.5 - 8.1 g/dL 7.9 7.6 8.5(H)  Total Bilirubin 0.3 - 1.2 mg/dL 0.4 0.3 0.3  Alkaline Phos 38 - 126 U/L 73 64 62  AST 15 - 41 U/L 18 16 17   ALT 0 - 44 U/L 12 11 12        DIAGNOSTIC IMAGING:  I have independently reviewed the scans and discussed with the patient.   I have reviewed Venita Lick LPN's note and agree with the documentation.  I personally performed a face-to-face visit, made revisions and my assessment and plan is as follows.    ASSESSMENT & PLAN:    Cancer of upper lobe of right lung 1.  Metastatic poorly differentiated adenocarcinoma of the lung to the bones and right adrenal: -Foundation 1 testing with 14 genomic alterations, no approved FDA therapies and the patient tumor type - On opdivo 480 mg monthly started on 02/25/2015 - She is continuing to tolerate immunotherapy without any major side effects. -We reviewed CT scan CAP on 01/28/2019.  It showed stable examination with stable T2 sclerotic lesion and partially calcified right adrenal nodule.  Several small pulmonary nodules throughout both lungs are stable.  - She is working part-time up to 4 hours/day at [employer].  Denies any immunotherapy related side effects including dry cough, diarrhea and skin rashes. -She may proceed with her Opdivo today and in 4 weeks. -I will see her back in 8 weeks for follow-up.  2.  CKD: -Creatinine is around 1.2 and stable.  We will monitor it closely.  3.  Mid back pain and pain in the back of legs: -She is taking oxycodone 1 to 2 tablets/day as needed.  It is well controlled.  4.  Bone metastasis: -Unfortunately she developed osteonecrosis of the upper GI. -We will continue to hold denosumab indefinitely.  She will continue calcium and vitamin D supplements.  She is following with her dentist.  5.  Weight loss: -She will continue Marinol 2.5 mg twice daily.  She is also drinking 4 cans of boost per day and eating 2 meals per day.  Weight is stable.       Orders placed this encounter:  No orders of the defined types were placed in this encounter.     Derek Jack, MD [place] [phone]

## 2019-01-31 NOTE — Assessment & Plan Note (Signed)
1.  Metastatic poorly differentiated adenocarcinoma of the lung to the bones and right adrenal: -Foundation 1 testing with 14 genomic alterations, no approved FDA therapies and the patient tumor type - On opdivo 480 mg monthly started on 02/25/2015 - She is continuing to tolerate immunotherapy without any major side effects. -We reviewed CT scan CAP on 01/28/2019.  It showed stable examination with stable T2 sclerotic lesion and partially calcified right adrenal nodule.  Several small pulmonary nodules throughout both lungs are stable.  - She is working part-time up to 4 hours/day at [employer].  Denies any immunotherapy related side effects including dry cough, diarrhea and skin rashes. -She may proceed with her Opdivo today and in 4 weeks. -I will see her back in 8 weeks for follow-up.  2.  CKD: -Creatinine is around 1.2 and stable.  We will monitor it closely.  3.  Mid back pain and pain in the back of legs: -She is taking oxycodone 1 to 2 tablets/day as needed.  It is well controlled.  4.  Bone metastasis: -Unfortunately she developed osteonecrosis of the upper GI. -We will continue to hold denosumab indefinitely.  She will continue calcium and vitamin D supplements.  She is following with her dentist.  5.  Weight loss: -She will continue Marinol 2.5 mg twice daily.  She is also drinking 4 cans of boost per day and eating 2 meals per day.  Weight is stable.

## 2019-01-31 NOTE — Progress Notes (Signed)
Patient here for treatment today. No new issues other that some blood in urine x 1 , will get urine specimen per  MD. Proceed with treatment today.   Treatment given per orders. Patient tolerated it well without problems. Vitals stable and discharged home from clinic ambulatory. Follow up as scheduled.   1700-called patient to let her know her urine was clear.

## 2019-01-31 NOTE — Patient Instructions (Signed)
[facility] Discharge Instructions for Patients Receiving Chemotherapy  Today you received the following chemotherapy agents   To help prevent nausea and vomiting after your treatment, we encourage you to take your nausea medication   If you develop nausea and vomiting that is not controlled by your nausea medication, call the clinic.   BELOW ARE SYMPTOMS THAT SHOULD BE REPORTED IMMEDIATELY:  *FEVER GREATER THAN 100.5 F  *CHILLS WITH OR WITHOUT FEVER  NAUSEA AND VOMITING THAT IS NOT CONTROLLED WITH YOUR NAUSEA MEDICATION  *UNUSUAL SHORTNESS OF BREATH  *UNUSUAL BRUISING OR BLEEDING  TENDERNESS IN MOUTH AND THROAT WITH OR WITHOUT PRESENCE OF ULCERS  *URINARY PROBLEMS  *BOWEL PROBLEMS  UNUSUAL RASH Items with * indicate a potential emergency and should be followed up as soon as possible.  Feel free to call the clinic should you have any questions or concerns. The clinic phone number is [phone].  Please show the CHEMO ALERT CARD at check-in to the Emergency Department and triage nurse.   

## 2019-01-31 NOTE — Patient Instructions (Signed)
Melissa Sullivan at [facility] Discharge Instructions  You were seen today by Dr. Delton Coombes. He went over your recent lab results. He wants you to continue to stay active. He will see you back in 2 months for labs, follow up and treatment.    Thank you for choosing [facility] at [facility] to provide your oncology and hematology care.  To afford each patient quality time with our provider, please arrive at least 15 minutes before your scheduled appointment time.   If you have a lab appointment with the [facility] please come in thru the  Main Entrance and check in at the main information desk  You need to re-schedule your appointment should you arrive 10 or more minutes late.  We strive to give you quality time with our providers, and arriving late affects you and other patients whose appointments are after yours.  Also, if you no show three or more times for appointments you may be dismissed from the clinic at the providers discretion.     Again, thank you for choosing [facility].  Our hope is that these requests will decrease the amount of time that you wait before being seen by our physicians.       _____________________________________________________________  Should you have questions after your visit to [facility], please contact our office at [phone] between the hours of 8:00 a.m. and 4:30 p.m.  Voicemails left after 4:00 p.m. will not be returned until the following business day.  For prescription refill requests, have your pharmacy contact our office and allow 72 hours.    Cancer Center Support Programs:   > Cancer Support Group  2nd Tuesday of the month 1pm-2pm, Journey Room

## 2019-02-12 ENCOUNTER — Other Ambulatory Visit (HOSPITAL_COMMUNITY): Payer: Self-pay | Admitting: Nurse Practitioner

## 2019-02-12 DIAGNOSIS — E032 Hypothyroidism due to medicaments and other exogenous substances: Secondary | ICD-10-CM

## 2019-02-27 ENCOUNTER — Other Ambulatory Visit: Payer: Self-pay

## 2019-02-28 ENCOUNTER — Encounter (HOSPITAL_COMMUNITY): Payer: Self-pay

## 2019-02-28 ENCOUNTER — Inpatient Hospital Stay (HOSPITAL_COMMUNITY): Payer: Medicare HMO

## 2019-02-28 ENCOUNTER — Other Ambulatory Visit (HOSPITAL_COMMUNITY): Payer: Self-pay | Admitting: *Deleted

## 2019-02-28 ENCOUNTER — Other Ambulatory Visit: Payer: Self-pay

## 2019-02-28 ENCOUNTER — Inpatient Hospital Stay (HOSPITAL_COMMUNITY): Payer: Medicare HMO | Attending: Hematology

## 2019-02-28 ENCOUNTER — Ambulatory Visit (HOSPITAL_COMMUNITY): Payer: Medicare HMO

## 2019-02-28 VITALS — BP 112/71 | HR 82 | Temp 98.2°F | Resp 18 | Wt 99.6 lb

## 2019-02-28 DIAGNOSIS — Z5112 Encounter for antineoplastic immunotherapy: Secondary | ICD-10-CM | POA: Insufficient documentation

## 2019-02-28 DIAGNOSIS — C7971 Secondary malignant neoplasm of right adrenal gland: Secondary | ICD-10-CM | POA: Insufficient documentation

## 2019-02-28 DIAGNOSIS — E032 Hypothyroidism due to medicaments and other exogenous substances: Secondary | ICD-10-CM | POA: Diagnosis not present

## 2019-02-28 DIAGNOSIS — C3411 Malignant neoplasm of upper lobe, right bronchus or lung: Secondary | ICD-10-CM | POA: Insufficient documentation

## 2019-02-28 DIAGNOSIS — C7951 Secondary malignant neoplasm of bone: Secondary | ICD-10-CM | POA: Insufficient documentation

## 2019-02-28 DIAGNOSIS — E278 Other specified disorders of adrenal gland: Secondary | ICD-10-CM

## 2019-02-28 DIAGNOSIS — R630 Anorexia: Secondary | ICD-10-CM

## 2019-02-28 LAB — TSH: TSH: 1.252 u[IU]/mL (ref 0.350–4.500)

## 2019-02-28 LAB — CBC WITH DIFFERENTIAL/PLATELET
Abs Immature Granulocytes: 0.03 10*3/uL (ref 0.00–0.07)
Basophils Absolute: 0 10*3/uL (ref 0.0–0.1)
Basophils Relative: 0 %
Eosinophils Absolute: 0.1 10*3/uL (ref 0.0–0.5)
Eosinophils Relative: 1 %
HCT: 40.9 % (ref 36.0–46.0)
Hemoglobin: 13.2 g/dL (ref 12.0–15.0)
Immature Granulocytes: 0 %
Lymphocytes Relative: 21 %
Lymphs Abs: 1.9 10*3/uL (ref 0.7–4.0)
MCH: 29.9 pg (ref 26.0–34.0)
MCHC: 32.3 g/dL (ref 30.0–36.0)
MCV: 92.7 fL (ref 80.0–100.0)
Monocytes Absolute: 0.5 10*3/uL (ref 0.1–1.0)
Monocytes Relative: 6 %
Neutro Abs: 6.6 10*3/uL (ref 1.7–7.7)
Neutrophils Relative %: 72 %
Platelets: 259 10*3/uL (ref 150–400)
RBC: 4.41 MIL/uL (ref 3.87–5.11)
RDW: 16.7 % — ABNORMAL HIGH (ref 11.5–15.5)
WBC: 9.2 10*3/uL (ref 4.0–10.5)
nRBC: 0 % (ref 0.0–0.2)

## 2019-02-28 LAB — COMPREHENSIVE METABOLIC PANEL
ALT: 12 U/L (ref 0–44)
AST: 18 U/L (ref 15–41)
Albumin: 3.8 g/dL (ref 3.5–5.0)
Alkaline Phosphatase: 69 U/L (ref 38–126)
Anion gap: 9 (ref 5–15)
BUN: 10 mg/dL (ref 8–23)
CO2: 26 mmol/L (ref 22–32)
Calcium: 9.5 mg/dL (ref 8.9–10.3)
Chloride: 106 mmol/L (ref 98–111)
Creatinine, Ser: 1.15 mg/dL — ABNORMAL HIGH (ref 0.44–1.00)
GFR calc Af Amer: 59 mL/min — ABNORMAL LOW (ref >60)
GFR calc non Af Amer: 51 mL/min — ABNORMAL LOW (ref >60)
Glucose, Bld: 106 mg/dL — ABNORMAL HIGH (ref 70–99)
Potassium: 3.5 mmol/L (ref 3.5–5.1)
Sodium: 141 mmol/L (ref 135–145)
Total Bilirubin: 0.4 mg/dL (ref 0.3–1.2)
Total Protein: 7.7 g/dL (ref 6.5–8.1)

## 2019-02-28 LAB — LACTATE DEHYDROGENASE: LDH: 117 U/L (ref 98–192)

## 2019-02-28 MED ORDER — SODIUM CHLORIDE 0.9 % IV SOLN
480.0000 mg | Freq: Once | INTRAVENOUS | Status: AC
  Administered 2019-02-28: 11:00:00 480 mg via INTRAVENOUS
  Filled 2019-02-28: qty 48

## 2019-02-28 MED ORDER — SODIUM CHLORIDE 0.9% FLUSH
10.0000 mL | INTRAVENOUS | Status: DC | PRN
  Administered 2019-02-28: 10:00:00 10 mL
  Filled 2019-02-28: qty 10

## 2019-02-28 MED ORDER — SODIUM CHLORIDE 0.9 % IV SOLN
Freq: Once | INTRAVENOUS | Status: AC
  Administered 2019-02-28: 10:00:00 via INTRAVENOUS

## 2019-02-28 MED ORDER — DRONABINOL 2.5 MG PO CAPS: 2.5000 mg | ORAL_CAPSULE | Freq: Two times a day (BID) | ORAL | 2 refills | Status: DC

## 2019-02-28 NOTE — Patient Instructions (Signed)
[facility] Discharge Instructions for Patients Receiving Chemotherapy  Today you received the following chemotherapy agents   To help prevent nausea and vomiting after your treatment, we encourage you to take your nausea medication   If you develop nausea and vomiting that is not controlled by your nausea medication, call the clinic.   BELOW ARE SYMPTOMS THAT SHOULD BE REPORTED IMMEDIATELY:  *FEVER GREATER THAN 100.5 F  *CHILLS WITH OR WITHOUT FEVER  NAUSEA AND VOMITING THAT IS NOT CONTROLLED WITH YOUR NAUSEA MEDICATION  *UNUSUAL SHORTNESS OF BREATH  *UNUSUAL BRUISING OR BLEEDING  TENDERNESS IN MOUTH AND THROAT WITH OR WITHOUT PRESENCE OF ULCERS  *URINARY PROBLEMS  *BOWEL PROBLEMS  UNUSUAL RASH Items with * indicate a potential emergency and should be followed up as soon as possible.  Feel free to call the clinic should you have any questions or concerns. The clinic phone number is [phone].  Please show the CHEMO ALERT CARD at check-in to the Emergency Department and triage nurse.   

## 2019-02-28 NOTE — Progress Notes (Signed)
Pt presents today for Opdivo. VSS and labs reviewed and within parameters for treatment. Pt has no complaints of any changes since the last visit.   Treatment given today per MD orders. Tolerated infusion without adverse affects. Vital signs stable. No complaints at this time. Discharged from clinic ambulatory. F/U with [facility] as scheduled.

## 2019-03-15 ENCOUNTER — Encounter: Payer: Self-pay | Admitting: Hematology

## 2019-03-15 DIAGNOSIS — E782 Mixed hyperlipidemia: Secondary | ICD-10-CM | POA: Diagnosis not present

## 2019-03-15 DIAGNOSIS — D508 Other iron deficiency anemias: Secondary | ICD-10-CM | POA: Diagnosis not present

## 2019-03-15 DIAGNOSIS — E039 Hypothyroidism, unspecified: Secondary | ICD-10-CM | POA: Diagnosis not present

## 2019-03-15 DIAGNOSIS — R7301 Impaired fasting glucose: Secondary | ICD-10-CM | POA: Diagnosis not present

## 2019-03-19 DIAGNOSIS — J45998 Other asthma: Secondary | ICD-10-CM | POA: Diagnosis not present

## 2019-03-19 DIAGNOSIS — E440 Moderate protein-calorie malnutrition: Secondary | ICD-10-CM | POA: Diagnosis not present

## 2019-03-19 DIAGNOSIS — D649 Anemia, unspecified: Secondary | ICD-10-CM | POA: Diagnosis not present

## 2019-03-19 DIAGNOSIS — R634 Abnormal weight loss: Secondary | ICD-10-CM | POA: Diagnosis not present

## 2019-03-19 DIAGNOSIS — E782 Mixed hyperlipidemia: Secondary | ICD-10-CM | POA: Diagnosis not present

## 2019-03-19 DIAGNOSIS — C3490 Malignant neoplasm of unspecified part of unspecified bronchus or lung: Secondary | ICD-10-CM | POA: Diagnosis not present

## 2019-03-19 DIAGNOSIS — N183 Chronic kidney disease, stage 3 (moderate): Secondary | ICD-10-CM | POA: Diagnosis not present

## 2019-03-19 DIAGNOSIS — R7301 Impaired fasting glucose: Secondary | ICD-10-CM | POA: Diagnosis not present

## 2019-03-19 DIAGNOSIS — E039 Hypothyroidism, unspecified: Secondary | ICD-10-CM | POA: Diagnosis not present

## 2019-03-28 ENCOUNTER — Encounter (HOSPITAL_COMMUNITY): Payer: Self-pay | Admitting: Hematology

## 2019-03-28 ENCOUNTER — Inpatient Hospital Stay (HOSPITAL_COMMUNITY): Payer: Medicare HMO | Attending: Hematology

## 2019-03-28 ENCOUNTER — Inpatient Hospital Stay (HOSPITAL_BASED_OUTPATIENT_CLINIC_OR_DEPARTMENT_OTHER): Payer: Medicare HMO | Admitting: Hematology

## 2019-03-28 ENCOUNTER — Inpatient Hospital Stay (HOSPITAL_COMMUNITY): Payer: Medicare HMO

## 2019-03-28 ENCOUNTER — Other Ambulatory Visit: Payer: Self-pay

## 2019-03-28 VITALS — BP 132/72 | HR 67 | Temp 98.0°F | Resp 18

## 2019-03-28 DIAGNOSIS — C7951 Secondary malignant neoplasm of bone: Secondary | ICD-10-CM

## 2019-03-28 DIAGNOSIS — C3411 Malignant neoplasm of upper lobe, right bronchus or lung: Secondary | ICD-10-CM | POA: Insufficient documentation

## 2019-03-28 DIAGNOSIS — Z5112 Encounter for antineoplastic immunotherapy: Secondary | ICD-10-CM | POA: Diagnosis not present

## 2019-03-28 DIAGNOSIS — F17210 Nicotine dependence, cigarettes, uncomplicated: Secondary | ICD-10-CM | POA: Insufficient documentation

## 2019-03-28 DIAGNOSIS — E032 Hypothyroidism due to medicaments and other exogenous substances: Secondary | ICD-10-CM

## 2019-03-28 DIAGNOSIS — N189 Chronic kidney disease, unspecified: Secondary | ICD-10-CM

## 2019-03-28 DIAGNOSIS — I700 Atherosclerosis of aorta: Secondary | ICD-10-CM | POA: Diagnosis not present

## 2019-03-28 DIAGNOSIS — E1122 Type 2 diabetes mellitus with diabetic chronic kidney disease: Secondary | ICD-10-CM

## 2019-03-28 DIAGNOSIS — Z79899 Other long term (current) drug therapy: Secondary | ICD-10-CM | POA: Insufficient documentation

## 2019-03-28 DIAGNOSIS — C7971 Secondary malignant neoplasm of right adrenal gland: Secondary | ICD-10-CM | POA: Insufficient documentation

## 2019-03-28 DIAGNOSIS — I129 Hypertensive chronic kidney disease with stage 1 through stage 4 chronic kidney disease, or unspecified chronic kidney disease: Secondary | ICD-10-CM

## 2019-03-28 DIAGNOSIS — R69 Illness, unspecified: Secondary | ICD-10-CM | POA: Diagnosis not present

## 2019-03-28 DIAGNOSIS — E278 Other specified disorders of adrenal gland: Secondary | ICD-10-CM

## 2019-03-28 LAB — COMPREHENSIVE METABOLIC PANEL WITH GFR
ALT: 20 U/L (ref 0–44)
AST: 22 U/L (ref 15–41)
Albumin: 3.9 g/dL (ref 3.5–5.0)
Alkaline Phosphatase: 66 U/L (ref 38–126)
Anion gap: 10 (ref 5–15)
BUN: 12 mg/dL (ref 8–23)
CO2: 26 mmol/L (ref 22–32)
Calcium: 10 mg/dL (ref 8.9–10.3)
Chloride: 103 mmol/L (ref 98–111)
Creatinine, Ser: 1.15 mg/dL — ABNORMAL HIGH (ref 0.44–1.00)
GFR calc Af Amer: 59 mL/min — ABNORMAL LOW
GFR calc non Af Amer: 51 mL/min — ABNORMAL LOW
Glucose, Bld: 78 mg/dL (ref 70–99)
Potassium: 4 mmol/L (ref 3.5–5.1)
Sodium: 139 mmol/L (ref 135–145)
Total Bilirubin: 0.3 mg/dL (ref 0.3–1.2)
Total Protein: 7.7 g/dL (ref 6.5–8.1)

## 2019-03-28 LAB — CBC WITH DIFFERENTIAL/PLATELET
Abs Immature Granulocytes: 0.03 K/uL (ref 0.00–0.07)
Basophils Absolute: 0 K/uL (ref 0.0–0.1)
Basophils Relative: 0 %
Eosinophils Absolute: 0.2 K/uL (ref 0.0–0.5)
Eosinophils Relative: 2 %
HCT: 40.8 % (ref 36.0–46.0)
Hemoglobin: 12.7 g/dL (ref 12.0–15.0)
Immature Granulocytes: 0 %
Lymphocytes Relative: 31 %
Lymphs Abs: 2.6 K/uL (ref 0.7–4.0)
MCH: 29.7 pg (ref 26.0–34.0)
MCHC: 31.1 g/dL (ref 30.0–36.0)
MCV: 95.6 fL (ref 80.0–100.0)
Monocytes Absolute: 0.8 K/uL (ref 0.1–1.0)
Monocytes Relative: 10 %
Neutro Abs: 4.8 K/uL (ref 1.7–7.7)
Neutrophils Relative %: 57 %
Platelets: 280 K/uL (ref 150–400)
RBC: 4.27 MIL/uL (ref 3.87–5.11)
RDW: 16 % — ABNORMAL HIGH (ref 11.5–15.5)
WBC: 8.5 K/uL (ref 4.0–10.5)
nRBC: 0 % (ref 0.0–0.2)

## 2019-03-28 LAB — TSH: TSH: 2.781 u[IU]/mL (ref 0.350–4.500)

## 2019-03-28 LAB — LACTATE DEHYDROGENASE: LDH: 110 U/L (ref 98–192)

## 2019-03-28 MED ORDER — SODIUM CHLORIDE 0.9 % IV SOLN
480.0000 mg | Freq: Once | INTRAVENOUS | Status: AC
  Administered 2019-03-28: 480 mg via INTRAVENOUS
  Filled 2019-03-28: qty 48

## 2019-03-28 MED ORDER — HEPARIN SOD (PORK) LOCK FLUSH 100 UNIT/ML IV SOLN
500.0000 [IU] | Freq: Once | INTRAVENOUS | Status: AC | PRN
  Administered 2019-03-28: 500 [IU]

## 2019-03-28 MED ORDER — SODIUM CHLORIDE 0.9 % IV SOLN
Freq: Once | INTRAVENOUS | Status: AC
  Administered 2019-03-28: 12:00:00 via INTRAVENOUS

## 2019-03-28 MED ORDER — OXYCODONE HCL 10 MG PO TABS: 10.0000 mg | ORAL_TABLET | Freq: Two times a day (BID) | ORAL | 0 refills | Status: DC | PRN

## 2019-03-28 MED ORDER — SODIUM CHLORIDE 0.9% FLUSH
10.0000 mL | INTRAVENOUS | Status: DC | PRN
  Administered 2019-03-28: 10 mL
  Filled 2019-03-28: qty 10

## 2019-03-28 NOTE — Progress Notes (Signed)
[place], [place] [address]   CLINIC:  Medical Oncology/Hematology  PCP:  [name], MD [place] [address] [phone]   REASON FOR VISIT: Follow-up for metastatic poorly differentiated adenocarcinoma of the lung to the bones and right adrenal  CURRENT THERAPY:Opidvo every 4 weeks  BRIEF ONCOLOGIC HISTORY:    Cancer of upper lobe of right lung ([place])   07/28/2014 Imaging    CT chest: Large R apical mass consistent with malignancy. This is destroying the R 2nd rib with extension into adjacent soft tissue. R hilar adenopathy with R 5cm adrenal metastatic lesion.    08/01/2014 Initial Biopsy    Lung, needle/core biopsy(ies), right upper lobe - POORLY DIFFERENTIATED ADENOCARCINOMA, SEE COMMENT.    08/08/2014 PET scan    Large hypermetabolic R apical mass with evidence of direct chest wall and mediastinal invasion, right retrocrural lymphadenopathy, extensive retroperitoneal lymphadenopathy, and metastatic lesions to the adrenal glands     09/02/2014 - 11/04/2014 Chemotherapy    Cisplatin/Pemetrexed/Avastin every 21 days x 4 cycles    10/07/2014 - 10/27/2014 Radiation Therapy    Right lung apex for control of brachioplexopathy.    12/24/2014 - 02/25/2015 Chemotherapy    Alimta/Avastin every 21 days.    02/20/2015 Imaging    Increase in size of right adrenal metastasis and subjacent confluent retrocaval lymphadenopathy    02/25/2015 -  Chemotherapy    Nivolumab, zometa    05/04/2015 Imaging    CT CAP- Stable to slight decrease in the posterior right apical lesion. Stable appearance of posterior right upper rib an upper thoracic bony lesions. Slight improvement in right upper lobe tree-in-bud opacity. No new or progressive findings in...    07/28/2015 Imaging    CT CAP- Reduced size of the right apical pleural parenchymal lesion and reduced size of the right adrenal metastatic lesion. Resolution of prior retrocrural  adenopathy.  Right eccentric T1 and T2 sclerosis with sclerosis and tapering of the right second..    11/17/2015 Imaging    CT CAP- Stable soft tissue thickening in the apex of the right hemi thorax. Stable right adrenal metastasis. Nodularity along the trachea and mainstem bronchi, relatively new from 07/28/2015, favoring adherent debris.    11/18/2015 Treatment Plan Change    Zometa HELD for upcoming tooth extraction    11/24/2015 Treatment Plan Change    Zometa on hold at this time in preparation for tooth extraction in March 2017.  Zometa las given on 11/18/2015.    02/03/2016 Imaging    CT CAP- Heterogeneous right apical masslike consolidation and right adrenal metastasis are unchanged    04/06/2016 Treatment Plan Change    Zometa restarted 6 weeks out from tooth extraction (04/06/2016)    05/12/2016 Imaging    CT CAP- NED in the chest, abdomen or pelvis.  Some areas of nodularity associated with the mainstem bronchi in the left upper lobe bronchus, favored to represent adherent inspissated secretions    08/17/2016 Imaging    CT CAP- 1. Stable CTs of the chest and abdomen. No evidence of progressive metastatic disease. 2. Probable treated tumor at the right apex, right adrenal gland and T2 vertebral body, stable. 3. Fluctuating nodularity along the walls of the trachea and mainstem bronchi, likely secretions.    12/12/2016 Imaging    Further decrease in size of treated tumor within the right apex. 2. Stable treated tumor involving the right second rib and T2 vertebra. 3. Stable right  adrenal gland treated tumor. 4. Emphysema 5. Aortic atherosclerosis    01/04/2017 Imaging    MRI brain- Normal brain MRI.  No intracranial metastatic disease.    03/15/2017 Imaging    CT CAP- 1. No new or progressive metastatic disease in the chest or abdomen. 2. Stable treated tumor in the apical right upper lobe. Stable treated right posterior second rib and right T2 vertebral lesions. Stable  treated right adrenal metastasis. 3. Aortic atherosclerosis. 4. Moderate emphysema with mild diffuse bronchial wall thickening, suggesting COPD.    06/12/2017 Imaging    CT CAP 1. Similar appearance of treated primary within the right apex. 2. Similar areas of sclerosis within the right second rib, T2, and less so T1 vertebral bodies. These are most consistent with treated metastasis. 3. Similar right adrenal treated metastasis. 4. No evidence of new or progressive disease. 5. Similar right and progressive left areas of bronchial wall thickening and probable mucoid impaction. Correlate with interval infectious symptoms. 6.  Emphysema (ICD10-J43.9). 7. Coronary artery atherosclerosis. Aortic Atherosclerosis (ICD10-I70.0).     10/16/2017 Imaging    CT CAP 1. Stable appearance of the prior Pancoast tumor and related bony findings in the right second rib and right T1 and T2 vertebra compatible with successfully treated tumor. No significant enlargement or new lesions are identified. Similarly the treated right adrenal metastatic lesion is stable in appearance. 2. Upper normal size right hilar lymph node may warrant surveillance. Currently 9 mm in short axis. 3. Other imaging findings of potential clinical significance: Aortic Atherosclerosis (ICD10-I70.0) and Emphysema (ICD10-J43.9). Scattered proximal sigmoid colon diverticula. Mucus plugging medially in the left upper lobe and posteriorly in the right upper lobe.      INTERVAL HISTORY:  Melissa Sullivan 63 y.o. female returns for follow-up of metastatic lung cancer.  She is tolerating Keytruda very well.  She denies any diarrhea, skin rashes or dry cough.  Denies any fevers, infections or hospitalizations.  No ER visits reported.  Denies any shortness of breath on minimal exertion, presyncopal episodes.  Energy and appetite were reported at 100%.    REVIEW OF SYSTEMS:  Review of Systems  Musculoskeletal:       Occasional leg  pains.  All other systems reviewed and are negative.    PAST MEDICAL/SURGICAL HISTORY:  Past Medical History:  Diagnosis Date   Adrenal mass, right ([place]) 07/28/2014   Anemia    Bone metastases ([facility]) 04/05/2016   Cancer ([place])    lung  right   Diabetes mellitus without complication (HCC)    GERD (gastroesophageal reflux disease)    Hyperlipidemia    Hypertension    Hypothyroidism due to medication 01/30/2017   Lung mass 07/28/2014   Reflux    Past Surgical History:  Procedure Laterality Date   AMPUTATION Right 02/22/2017   Procedure: PARTIAL AMPUTATION RIGHT GREAT TOE;  Surgeon: Caprice Beaver, DPM;  Location: [facility];  Service: Podiatry;  Laterality: Right;   APPENDECTOMY     ESOPHAGOGASTRODUODENOSCOPY N/A 12/09/2014   DTO:IZTIWP esophageal stricture/mild-to-noderate erosive gastritis. negative H.pylori   FLEXIBLE SIGMOIDOSCOPY  2011   Dr. [name]: hyperplastic polyp   LUNG BIOPSY Right 07/2014   CT guided   MALONEY DILATION N/A 12/09/2014   Procedure: MALONEY DILATION;  Surgeon: [name], MD;  Location: [facility];  Service: Endoscopy;  Laterality: N/A;   PORTACATH PLACEMENT Left 09/01/2014   SAVORY DILATION N/A 12/09/2014   Procedure: SAVORY DILATION;  Surgeon: [name], MD;  Location: [facility];  Service: Endoscopy;  Laterality: N/A;     SOCIAL HISTORY:  Social History   Socioeconomic History   Marital status: Married    Spouse name: Not on file   Number of children: Not on file   Years of education: Not on file   Highest education level: Not on file  Occupational History   Not on file  Social Needs   Financial resource strain: Not on file   Food insecurity:    Worry: Not on file    Inability: Not on file   Transportation needs:    Medical: Not on file    Non-medical: Not on file  Tobacco Use   Smoking status: Light Tobacco Smoker    Packs/day: 0.30    Years: 20.00    Pack years: 6.00    Types: Cigarettes    Smokeless tobacco: Never Used  Substance and Sexual Activity   Alcohol use: No   Drug use: No   Sexual activity: Not on file  Lifestyle   Physical activity:    Days per week: Not on file    Minutes per session: Not on file   Stress: Not on file  Relationships   Social connections:    Talks on phone: Not on file    Gets together: Not on file    Attends religious service: Not on file    Active member of club or organization: Not on file    Attends meetings of clubs or organizations: Not on file    Relationship status: Not on file   Intimate partner violence:    Fear of current or ex partner: Not on file    Emotionally abused: Not on file    Physically abused: Not on file    Forced sexual activity: Not on file  Other Topics Concern   Not on file  Social History Narrative   Not on file    FAMILY HISTORY:  Family History  Problem Relation Age of Onset   Cancer Sister    Colon cancer Neg Hx     CURRENT MEDICATIONS:  Outpatient Encounter Medications as of 03/28/2019  Medication Sig Note   amoxicillin (AMOXIL) 500 MG capsule     Calcium Carb-Cholecalciferol (CALCIUM 1000 + D PO) Take 1 tablet by mouth daily.    chlorhexidine (PERIDEX) 0.12 % solution     dronabinol (MARINOL) 2.5 MG capsule Take 1 capsule (2.5 mg total) by mouth 2 (two) times daily before lunch and supper.    ENSURE (ENSURE) Take 1 Can by mouth 4 (four) times daily.    Fluticasone-Salmeterol (ADVAIR DISKUS) 500-50 MCG/DOSE AEPB Inhale 1 puff into the lungs 2 (two) times daily. (Patient taking differently: Inhale 1 puff into the lungs 2 (two) times daily as needed (for respiratory issues.). )    ibuprofen (ADVIL,MOTRIN) 200 MG tablet Take 400 mg by mouth every 8 (eight) hours as needed (for pain.).    levothyroxine (SYNTHROID, LEVOTHROID) 25 MCG tablet TAKE 1/2 (ONE-HALF) TABLET BY MOUTH ONCE DAILY BEFORE BREAKFAST    lidocaine-prilocaine (EMLA) cream APPLY A QUARTER SIZE AMOUNT TO PORT SITE 1  HOUR PRIOR TO CHEMO.  DO NOT RUB IN.  COVER WITH PLASTIC WRAP    magic mouthwash SOLN Take 5 mLs by mouth 4 (four) times daily as needed for mouth pain.    Nivolumab (OPDIVO IV) Inject into the vein every 28 (twenty-eight) days.    omeprazole (PRILOSEC) 40 MG capsule Take 1 capsule (40 mg total) by mouth daily. TAKE 30 MINS PRIOR  TO YOUR FIRST MEAL.    ondansetron (ZOFRAN) 8 MG tablet Take 1 tablet (8 mg total) by mouth every 8 (eight) hours as needed for nausea or vomiting.    Oxycodone HCl 10 MG TABS Take 1-2 tablets (10-20 mg total) by mouth every 12 (twelve) hours as needed (pain.).    simvastatin (ZOCOR) 40 MG tablet TAKE ONE TABLET BY MOUTH ONCE DAILY IN THE MORNING    [DISCONTINUED] Oxycodone HCl 10 MG TABS Take 1-2 tablets (10-20 mg total) by mouth every 6 (six) hours as needed (pain.).    denosumab (XGEVA) 120 MG/1.7ML SOLN injection Inject 120 mg into the skin every 28 (twenty-eight) days. 11/08/2018: Hold per MD until dental work is complete.    Facility-Administered Encounter Medications as of 03/28/2019  Medication   sodium chloride flush (NS) 0.9 % injection 10 mL    ALLERGIES:  Allergies  Allergen Reactions   Xgeva [Denosumab]     osteonecrosis     PHYSICAL EXAM:  ECOG Performance status: 1  Vitals:   03/28/19 0952  BP: 135/82  Pulse: 86  Resp: 18  Temp: 98.3 F (36.8 C)  SpO2: 100%   Filed Weights   03/28/19 0952  Weight: 100 lb 9.6 oz (45.6 kg)    Physical Exam Constitutional:      Appearance: Normal appearance. She is normal weight.  Cardiovascular:     Rate and Rhythm: Normal rate and regular rhythm.     Heart sounds: Normal heart sounds.  Pulmonary:     Effort: Pulmonary effort is normal.     Breath sounds: Normal breath sounds.  Musculoskeletal: Normal range of motion.  Skin:    General: Skin is warm and dry.  Neurological:     Mental Status: She is alert and oriented to person, place, and time. Mental status is at baseline.    Psychiatric:        Mood and Affect: Mood normal.        Behavior: Behavior normal.        Judgment: Judgment normal.      LABORATORY DATA:  I have reviewed the labs as listed.  CBC    Component Value Date/Time   WBC 8.5 03/28/2019 0827   RBC 4.27 03/28/2019 0827   HGB 12.7 03/28/2019 0827   HCT 40.8 03/28/2019 0827   PLT 280 03/28/2019 0827   MCV 95.6 03/28/2019 0827   MCH 29.7 03/28/2019 0827   MCHC 31.1 03/28/2019 0827   RDW 16.0 (H) 03/28/2019 0827   LYMPHSABS 2.6 03/28/2019 0827   MONOABS 0.8 03/28/2019 0827   EOSABS 0.2 03/28/2019 0827   BASOSABS 0.0 03/28/2019 0827   CMP Latest Ref Rng & Units 03/28/2019 02/28/2019 01/31/2019  Glucose 70 - 99 mg/dL 78 106(H) 92  BUN 8 - 23 mg/dL 12 10 14   Creatinine 0.44 - 1.00 mg/dL 1.15(H) 1.15(H) 1.24(H)  Sodium 135 - 145 mmol/L 139 141 137  Potassium 3.5 - 5.1 mmol/L 4.0 3.5 4.1  Chloride 98 - 111 mmol/L 103 106 103  CO2 22 - 32 mmol/L 26 26 26   Calcium 8.9 - 10.3 mg/dL 10.0 9.5 10.1  Total Protein 6.5 - 8.1 g/dL 7.7 7.7 7.9  Total Bilirubin 0.3 - 1.2 mg/dL 0.3 0.4 0.4  Alkaline Phos 38 - 126 U/L 66 69 73  AST 15 - 41 U/L 22 18 18   ALT 0 - 44 U/L 20 12 12        DIAGNOSTIC IMAGING:  I have independently reviewed the scans and  discussed with the patient.   I have reviewed Francene Finders, NP's note and agree with the documentation.  I personally performed a face-to-face visit, made revisions and my assessment and plan is as follows.    ASSESSMENT & PLAN:   Cancer of upper lobe of right lung 1.  Metastatic poorly differentiated adenocarcinoma of the lung to the bones and right adrenal: -Foundation 1 testing with 14 genomic alterations, no approved FDA therapies and the patient tumor type - On opdivo 480 mg monthly started on 02/25/2015 - She is continuing to tolerate immunotherapy without any major side effects. -We reviewed CT scan CAP on 01/28/2019.  It showed stable examination with stable T2 sclerotic lesion and  partially calcified right adrenal nodule.  Several small pulmonary nodules throughout both lungs are stable.  - She used to work part-time at [employer].  However because of the recent COVID pandemic, she stopped working. -She is continuing to tolerate Opdivo very well.  Denies any diarrhea or pneumonitis symptoms. -She may proceed with her treatment today.  We will see her back in 8 weeks for follow-up.  I plan to repeat scans 6 months after last scan. - She does report pains in her legs and cramping in her right hand.  She takes oxycodone 1 to 2 tablets as needed.  We will give her a refill.  2.  CKD: -Creatinine is stable around 1.2.  Will monitor closely.  3.  Mid back pain and pain in the back of legs: -She is taking oxycodone 1 to 2 tablets/day as needed.  It is well controlled.  4.  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 -We will continue to hold denosumab indefinitely.  She will continue calcium and vitamin D supplements.  5.  Weight loss: -We will continue Marinol 2.5 mg twice daily.  She is also drinking boost.  Weight is stable.       Total time spent is 25 minutes with more than 50% of the time spent face-to-face discussing diagnosis treatment plan and coordination of care. Orders placed this encounter:  No orders of the defined types were placed in this encounter.     Derek Jack, MD [place] [phone]

## 2019-03-28 NOTE — Assessment & Plan Note (Signed)
1.  Metastatic poorly differentiated adenocarcinoma of the lung to the bones and right adrenal: -Foundation 1 testing with 14 genomic alterations, no approved FDA therapies and the patient tumor type - On opdivo 480 mg monthly started on 02/25/2015 - She is continuing to tolerate immunotherapy without any major side effects. -We reviewed CT scan CAP on 01/28/2019.  It showed stable examination with stable T2 sclerotic lesion and partially calcified right adrenal nodule.  Several small pulmonary nodules throughout both lungs are stable.  - She used to work part-time at [employer].  However because of the recent COVID pandemic, she stopped working. -She is continuing to tolerate Opdivo very well.  Denies any diarrhea or pneumonitis symptoms. -She may proceed with her treatment today.  We will see her back in 8 weeks for follow-up.  I plan to repeat scans 6 months after last scan. - She does report pains in her legs and cramping in her right hand.  She takes oxycodone 1 to 2 tablets as needed.  We will give her a refill.  2.  CKD: -Creatinine is stable around 1.2.  Will monitor closely.  3.  Mid back pain and pain in the back of legs: -She is taking oxycodone 1 to 2 tablets/day as needed.  It is well controlled.  4.  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 -We will continue to hold denosumab indefinitely.  She will continue calcium and vitamin D supplements.  5.  Weight loss: -We will continue Marinol 2.5 mg twice daily.  She is also drinking boost.  Weight is stable.

## 2019-03-28 NOTE — Progress Notes (Signed)
Treatment given today per MD orders. Tolerated infusion without adverse affects. Vital signs stable. No complaints at this time. Discharged from clinic ambulatory. F/U with [facility] as scheduled.   

## 2019-04-05 DIAGNOSIS — B351 Tinea unguium: Secondary | ICD-10-CM | POA: Diagnosis not present

## 2019-04-05 DIAGNOSIS — G629 Polyneuropathy, unspecified: Secondary | ICD-10-CM | POA: Diagnosis not present

## 2019-04-05 DIAGNOSIS — C3490 Malignant neoplasm of unspecified part of unspecified bronchus or lung: Secondary | ICD-10-CM | POA: Diagnosis not present

## 2019-04-24 ENCOUNTER — Other Ambulatory Visit: Payer: Self-pay

## 2019-04-25 ENCOUNTER — Inpatient Hospital Stay (HOSPITAL_COMMUNITY): Payer: Medicare HMO | Attending: Hematology

## 2019-04-25 ENCOUNTER — Ambulatory Visit (HOSPITAL_COMMUNITY): Payer: Medicare HMO | Admitting: Hematology

## 2019-04-25 ENCOUNTER — Encounter (HOSPITAL_COMMUNITY): Payer: Self-pay | Admitting: *Deleted

## 2019-04-25 ENCOUNTER — Encounter (HOSPITAL_COMMUNITY): Payer: Self-pay

## 2019-04-25 ENCOUNTER — Other Ambulatory Visit (HOSPITAL_COMMUNITY): Payer: Medicare HMO

## 2019-04-25 ENCOUNTER — Other Ambulatory Visit: Payer: Self-pay

## 2019-04-25 VITALS — BP 125/65 | HR 68 | Temp 97.9°F | Resp 16 | Wt 95.4 lb

## 2019-04-25 DIAGNOSIS — M79606 Pain in leg, unspecified: Secondary | ICD-10-CM | POA: Insufficient documentation

## 2019-04-25 DIAGNOSIS — N189 Chronic kidney disease, unspecified: Secondary | ICD-10-CM | POA: Insufficient documentation

## 2019-04-25 DIAGNOSIS — Z79899 Other long term (current) drug therapy: Secondary | ICD-10-CM | POA: Diagnosis not present

## 2019-04-25 DIAGNOSIS — I700 Atherosclerosis of aorta: Secondary | ICD-10-CM | POA: Insufficient documentation

## 2019-04-25 DIAGNOSIS — C7951 Secondary malignant neoplasm of bone: Secondary | ICD-10-CM | POA: Diagnosis not present

## 2019-04-25 DIAGNOSIS — J439 Emphysema, unspecified: Secondary | ICD-10-CM | POA: Insufficient documentation

## 2019-04-25 DIAGNOSIS — T50905A Adverse effect of unspecified drugs, medicaments and biological substances, initial encounter: Secondary | ICD-10-CM | POA: Insufficient documentation

## 2019-04-25 DIAGNOSIS — E032 Hypothyroidism due to medicaments and other exogenous substances: Secondary | ICD-10-CM | POA: Diagnosis not present

## 2019-04-25 DIAGNOSIS — R634 Abnormal weight loss: Secondary | ICD-10-CM | POA: Insufficient documentation

## 2019-04-25 DIAGNOSIS — Z809 Family history of malignant neoplasm, unspecified: Secondary | ICD-10-CM | POA: Insufficient documentation

## 2019-04-25 DIAGNOSIS — I2510 Atherosclerotic heart disease of native coronary artery without angina pectoris: Secondary | ICD-10-CM | POA: Diagnosis not present

## 2019-04-25 DIAGNOSIS — C772 Secondary and unspecified malignant neoplasm of intra-abdominal lymph nodes: Secondary | ICD-10-CM | POA: Insufficient documentation

## 2019-04-25 DIAGNOSIS — C7971 Secondary malignant neoplasm of right adrenal gland: Secondary | ICD-10-CM | POA: Insufficient documentation

## 2019-04-25 DIAGNOSIS — Z5112 Encounter for antineoplastic immunotherapy: Secondary | ICD-10-CM | POA: Insufficient documentation

## 2019-04-25 DIAGNOSIS — M546 Pain in thoracic spine: Secondary | ICD-10-CM | POA: Diagnosis not present

## 2019-04-25 DIAGNOSIS — C3411 Malignant neoplasm of upper lobe, right bronchus or lung: Secondary | ICD-10-CM | POA: Insufficient documentation

## 2019-04-25 DIAGNOSIS — F17210 Nicotine dependence, cigarettes, uncomplicated: Secondary | ICD-10-CM | POA: Diagnosis not present

## 2019-04-25 DIAGNOSIS — Z9221 Personal history of antineoplastic chemotherapy: Secondary | ICD-10-CM | POA: Insufficient documentation

## 2019-04-25 DIAGNOSIS — M549 Dorsalgia, unspecified: Secondary | ICD-10-CM | POA: Insufficient documentation

## 2019-04-25 DIAGNOSIS — Z79891 Long term (current) use of opiate analgesic: Secondary | ICD-10-CM | POA: Diagnosis not present

## 2019-04-25 DIAGNOSIS — Z923 Personal history of irradiation: Secondary | ICD-10-CM | POA: Diagnosis not present

## 2019-04-25 DIAGNOSIS — E278 Other specified disorders of adrenal gland: Secondary | ICD-10-CM

## 2019-04-25 DIAGNOSIS — R69 Illness, unspecified: Secondary | ICD-10-CM | POA: Diagnosis not present

## 2019-04-25 LAB — COMPREHENSIVE METABOLIC PANEL
ALT: 15 U/L (ref 0–44)
AST: 21 U/L (ref 15–41)
Albumin: 3.9 g/dL (ref 3.5–5.0)
Alkaline Phosphatase: 63 U/L (ref 38–126)
Anion gap: 11 (ref 5–15)
BUN: 15 mg/dL (ref 8–23)
CO2: 24 mmol/L (ref 22–32)
Calcium: 9.7 mg/dL (ref 8.9–10.3)
Chloride: 103 mmol/L (ref 98–111)
Creatinine, Ser: 1.34 mg/dL — ABNORMAL HIGH (ref 0.44–1.00)
GFR calc Af Amer: 49 mL/min — ABNORMAL LOW (ref >60)
GFR calc non Af Amer: 42 mL/min — ABNORMAL LOW (ref >60)
Glucose, Bld: 87 mg/dL (ref 70–99)
Potassium: 3.5 mmol/L (ref 3.5–5.1)
Sodium: 138 mmol/L (ref 135–145)
Total Bilirubin: 0.2 mg/dL — ABNORMAL LOW (ref 0.3–1.2)
Total Protein: 7.6 g/dL (ref 6.5–8.1)

## 2019-04-25 LAB — CBC WITH DIFFERENTIAL/PLATELET
Abs Immature Granulocytes: 0.02 10*3/uL (ref 0.00–0.07)
Basophils Absolute: 0 10*3/uL (ref 0.0–0.1)
Basophils Relative: 0 %
Eosinophils Absolute: 0.2 10*3/uL (ref 0.0–0.5)
Eosinophils Relative: 2 %
HCT: 40.9 % (ref 36.0–46.0)
Hemoglobin: 13 g/dL (ref 12.0–15.0)
Immature Granulocytes: 0 %
Lymphocytes Relative: 37 %
Lymphs Abs: 3.3 10*3/uL (ref 0.7–4.0)
MCH: 30.2 pg (ref 26.0–34.0)
MCHC: 31.8 g/dL (ref 30.0–36.0)
MCV: 94.9 fL (ref 80.0–100.0)
Monocytes Absolute: 0.7 10*3/uL (ref 0.1–1.0)
Monocytes Relative: 8 %
Neutro Abs: 4.7 10*3/uL (ref 1.7–7.7)
Neutrophils Relative %: 53 %
Platelets: 252 10*3/uL (ref 150–400)
RBC: 4.31 MIL/uL (ref 3.87–5.11)
RDW: 15.6 % — ABNORMAL HIGH (ref 11.5–15.5)
WBC: 8.9 10*3/uL (ref 4.0–10.5)
nRBC: 0 % (ref 0.0–0.2)

## 2019-04-25 LAB — LACTATE DEHYDROGENASE: LDH: 110 U/L (ref 98–192)

## 2019-04-25 LAB — TSH: TSH: 3.567 u[IU]/mL (ref 0.350–4.500)

## 2019-04-25 MED ORDER — SODIUM CHLORIDE 0.9 % IV SOLN
480.0000 mg | Freq: Once | INTRAVENOUS | Status: AC
  Administered 2019-04-25: 480 mg via INTRAVENOUS
  Filled 2019-04-25: qty 20

## 2019-04-25 MED ORDER — SODIUM CHLORIDE 0.9% FLUSH
10.0000 mL | INTRAVENOUS | Status: DC | PRN
  Administered 2019-04-25: 10 mL
  Filled 2019-04-25: qty 10

## 2019-04-25 MED ORDER — SODIUM CHLORIDE 0.9 % IV SOLN
Freq: Once | INTRAVENOUS | Status: AC
  Administered 2019-04-25: 10:00:00 via INTRAVENOUS

## 2019-04-25 MED ORDER — HEPARIN SOD (PORK) LOCK FLUSH 100 UNIT/ML IV SOLN
500.0000 [IU] | Freq: Once | INTRAVENOUS | Status: AC | PRN
  Administered 2019-04-25: 500 [IU]

## 2019-04-25 NOTE — Patient Instructions (Signed)
[facility] Discharge Instructions for Patients Receiving Chemotherapy   Beginning January 23rd 2017 lab work for the [facility] will be done in the  Main lab at [facility] on 1st floor. If you have a lab appointment with the [facility] please come in thru the  Main Entrance and check in at the main information desk   Today you received the following chemotherapy agents Opdivo. Follow-up as scheduled. Call clinic for any questions or concerns  To help prevent nausea and vomiting after your treatment, we encourage you to take your nausea medication   If you develop nausea and vomiting, or diarrhea that is not controlled by your medication, call the clinic.  The clinic phone number is [phone]. Office hours are Monday-Friday 8:30am-5:00pm.  BELOW ARE SYMPTOMS THAT SHOULD BE REPORTED IMMEDIATELY:  *FEVER GREATER THAN 101.0 F  *CHILLS WITH OR WITHOUT FEVER  NAUSEA AND VOMITING THAT IS NOT CONTROLLED WITH YOUR NAUSEA MEDICATION  *UNUSUAL SHORTNESS OF BREATH  *UNUSUAL BRUISING OR BLEEDING  TENDERNESS IN MOUTH AND THROAT WITH OR WITHOUT PRESENCE OF ULCERS  *URINARY PROBLEMS  *BOWEL PROBLEMS  UNUSUAL RASH Items with * indicate a potential emergency and should be followed up as soon as possible. If you have an emergency after office hours please contact your primary care physician or go to the nearest emergency department.  Please call the clinic during office hours if you have any questions or concerns.   You may also contact the Patient Navigator at [phone] should you have any questions or need assistance in obtaining follow up care.      Resources For Cancer Patients and their Caregivers ? American Cancer Society: Can assist with transportation, wigs, general needs, runs Look Good Feel Better.        250-197-5586 ? Cancer Care: Provides financial assistance, online support groups, medication/co-pay assistance.  1-800-813-HOPE  (318)033-1224) ? [place] Assists [place] cancer patients and their families through emotional , educational and financial support.  971-776-8319 ? [place] DSS Where to apply for food stamps, Medicaid and utility assistance. 773-038-6024 ? [facility]: Transportation to medical appointments. (364)108-9186 ? Social Security Administration: May apply for disability if have a Stage IV cancer. ([phone] 110-2253 254-191-4334 ? [employer], Disability and Transit Services: Assists with nutrition, care and transit needs. (639)717-2189

## 2019-04-25 NOTE — Progress Notes (Signed)
Melissa Sullivan tolerated Opdivo infusion well without complaints or incident. Labs reviewed prior to administering this medication. VSS upon discharge. Pt discharged self ambulatory in satisfactory condition

## 2019-05-15 ENCOUNTER — Other Ambulatory Visit (HOSPITAL_COMMUNITY): Payer: Self-pay | Admitting: Nurse Practitioner

## 2019-05-15 DIAGNOSIS — E032 Hypothyroidism due to medicaments and other exogenous substances: Secondary | ICD-10-CM

## 2019-05-23 ENCOUNTER — Inpatient Hospital Stay (HOSPITAL_COMMUNITY): Payer: Medicare HMO

## 2019-05-23 ENCOUNTER — Inpatient Hospital Stay (HOSPITAL_COMMUNITY): Payer: Medicare HMO | Attending: Hematology

## 2019-05-23 ENCOUNTER — Other Ambulatory Visit: Payer: Self-pay

## 2019-05-23 ENCOUNTER — Encounter (HOSPITAL_COMMUNITY): Payer: Self-pay | Admitting: Hematology

## 2019-05-23 ENCOUNTER — Inpatient Hospital Stay (HOSPITAL_BASED_OUTPATIENT_CLINIC_OR_DEPARTMENT_OTHER): Payer: Medicare HMO | Admitting: Hematology

## 2019-05-23 VITALS — BP 119/62 | HR 72 | Temp 98.1°F

## 2019-05-23 VITALS — BP 136/80 | HR 102 | Temp 97.5°F | Resp 18 | Wt 95.7 lb

## 2019-05-23 DIAGNOSIS — M879 Osteonecrosis, unspecified: Secondary | ICD-10-CM | POA: Insufficient documentation

## 2019-05-23 DIAGNOSIS — M549 Dorsalgia, unspecified: Secondary | ICD-10-CM | POA: Insufficient documentation

## 2019-05-23 DIAGNOSIS — Z9221 Personal history of antineoplastic chemotherapy: Secondary | ICD-10-CM | POA: Diagnosis not present

## 2019-05-23 DIAGNOSIS — Z79899 Other long term (current) drug therapy: Secondary | ICD-10-CM

## 2019-05-23 DIAGNOSIS — C7971 Secondary malignant neoplasm of right adrenal gland: Secondary | ICD-10-CM | POA: Diagnosis not present

## 2019-05-23 DIAGNOSIS — C3411 Malignant neoplasm of upper lobe, right bronchus or lung: Secondary | ICD-10-CM

## 2019-05-23 DIAGNOSIS — C7951 Secondary malignant neoplasm of bone: Secondary | ICD-10-CM | POA: Insufficient documentation

## 2019-05-23 DIAGNOSIS — I700 Atherosclerosis of aorta: Secondary | ICD-10-CM | POA: Diagnosis not present

## 2019-05-23 DIAGNOSIS — Z923 Personal history of irradiation: Secondary | ICD-10-CM | POA: Insufficient documentation

## 2019-05-23 DIAGNOSIS — Z5112 Encounter for antineoplastic immunotherapy: Secondary | ICD-10-CM | POA: Diagnosis present

## 2019-05-23 DIAGNOSIS — J439 Emphysema, unspecified: Secondary | ICD-10-CM

## 2019-05-23 DIAGNOSIS — F17210 Nicotine dependence, cigarettes, uncomplicated: Secondary | ICD-10-CM | POA: Diagnosis not present

## 2019-05-23 DIAGNOSIS — E032 Hypothyroidism due to medicaments and other exogenous substances: Secondary | ICD-10-CM

## 2019-05-23 DIAGNOSIS — Z809 Family history of malignant neoplasm, unspecified: Secondary | ICD-10-CM | POA: Diagnosis not present

## 2019-05-23 DIAGNOSIS — M79606 Pain in leg, unspecified: Secondary | ICD-10-CM | POA: Insufficient documentation

## 2019-05-23 DIAGNOSIS — E278 Other specified disorders of adrenal gland: Secondary | ICD-10-CM

## 2019-05-23 DIAGNOSIS — I2510 Atherosclerotic heart disease of native coronary artery without angina pectoris: Secondary | ICD-10-CM | POA: Diagnosis not present

## 2019-05-23 LAB — COMPREHENSIVE METABOLIC PANEL
ALT: 18 U/L (ref 0–44)
AST: 21 U/L (ref 15–41)
Albumin: 3.8 g/dL (ref 3.5–5.0)
Alkaline Phosphatase: 73 U/L (ref 38–126)
Anion gap: 12 (ref 5–15)
BUN: 13 mg/dL (ref 8–23)
CO2: 24 mmol/L (ref 22–32)
Calcium: 9.4 mg/dL (ref 8.9–10.3)
Chloride: 105 mmol/L (ref 98–111)
Creatinine, Ser: 1.33 mg/dL — ABNORMAL HIGH (ref 0.44–1.00)
GFR calc Af Amer: 50 mL/min — ABNORMAL LOW (ref >60)
GFR calc non Af Amer: 43 mL/min — ABNORMAL LOW (ref >60)
Glucose, Bld: 96 mg/dL (ref 70–99)
Potassium: 3.4 mmol/L — ABNORMAL LOW (ref 3.5–5.1)
Sodium: 141 mmol/L (ref 135–145)
Total Bilirubin: 0.7 mg/dL (ref 0.3–1.2)
Total Protein: 7.7 g/dL (ref 6.5–8.1)

## 2019-05-23 LAB — CBC WITH DIFFERENTIAL/PLATELET
Abs Immature Granulocytes: 0.02 10*3/uL (ref 0.00–0.07)
Basophils Absolute: 0 10*3/uL (ref 0.0–0.1)
Basophils Relative: 0 %
Eosinophils Absolute: 0.2 10*3/uL (ref 0.0–0.5)
Eosinophils Relative: 3 %
HCT: 42.4 % (ref 36.0–46.0)
Hemoglobin: 13.4 g/dL (ref 12.0–15.0)
Immature Granulocytes: 0 %
Lymphocytes Relative: 30 %
Lymphs Abs: 2.3 10*3/uL (ref 0.7–4.0)
MCH: 30.5 pg (ref 26.0–34.0)
MCHC: 31.6 g/dL (ref 30.0–36.0)
MCV: 96.4 fL (ref 80.0–100.0)
Monocytes Absolute: 0.7 10*3/uL (ref 0.1–1.0)
Monocytes Relative: 10 %
Neutro Abs: 4.2 10*3/uL (ref 1.7–7.7)
Neutrophils Relative %: 57 %
Platelets: 267 10*3/uL (ref 150–400)
RBC: 4.4 MIL/uL (ref 3.87–5.11)
RDW: 15 % (ref 11.5–15.5)
WBC: 7.4 10*3/uL (ref 4.0–10.5)
nRBC: 0 % (ref 0.0–0.2)

## 2019-05-23 LAB — TSH: TSH: 3.259 u[IU]/mL (ref 0.350–4.500)

## 2019-05-23 LAB — LACTATE DEHYDROGENASE: LDH: 110 U/L (ref 98–192)

## 2019-05-23 MED ORDER — AMOXICILLIN 500 MG PO CAPS: 500.0000 mg | ORAL_CAPSULE | Freq: Three times a day (TID) | ORAL | 0 refills | Status: DC

## 2019-05-23 MED ORDER — SODIUM CHLORIDE 0.9 % IV SOLN
480.0000 mg | Freq: Once | INTRAVENOUS | Status: AC
  Administered 2019-05-23: 480 mg via INTRAVENOUS
  Filled 2019-05-23: qty 48

## 2019-05-23 MED ORDER — HEPARIN SOD (PORK) LOCK FLUSH 100 UNIT/ML IV SOLN
500.0000 [IU] | Freq: Once | INTRAVENOUS | Status: AC | PRN
  Administered 2019-05-23: 500 [IU]

## 2019-05-23 MED ORDER — SODIUM CHLORIDE 0.9% FLUSH
10.0000 mL | INTRAVENOUS | Status: DC | PRN
  Administered 2019-05-23: 10 mL
  Filled 2019-05-23: qty 10

## 2019-05-23 MED ORDER — SODIUM CHLORIDE 0.9 % IV SOLN
Freq: Once | INTRAVENOUS | Status: AC
  Administered 2019-05-23: 11:00:00 via INTRAVENOUS

## 2019-05-23 NOTE — Patient Instructions (Signed)
[facility] Discharge Instructions for Patients Receiving Chemotherapy  Today you received the following chemotherapy agents   To help prevent nausea and vomiting after your treatment, we encourage you to take your nausea medication   If you develop nausea and vomiting that is not controlled by your nausea medication, call the clinic.   BELOW ARE SYMPTOMS THAT SHOULD BE REPORTED IMMEDIATELY:  *FEVER GREATER THAN 100.5 F  *CHILLS WITH OR WITHOUT FEVER  NAUSEA AND VOMITING THAT IS NOT CONTROLLED WITH YOUR NAUSEA MEDICATION  *UNUSUAL SHORTNESS OF BREATH  *UNUSUAL BRUISING OR BLEEDING  TENDERNESS IN MOUTH AND THROAT WITH OR WITHOUT PRESENCE OF ULCERS  *URINARY PROBLEMS  *BOWEL PROBLEMS  UNUSUAL RASH Items with * indicate a potential emergency and should be followed up as soon as possible.  Feel free to call the clinic should you have any questions or concerns. The clinic phone number is [phone].  Please show the CHEMO ALERT CARD at check-in to the Emergency Department and triage nurse.   

## 2019-05-23 NOTE — Patient Instructions (Addendum)
[facility] at [facility] Discharge Instructions  You were seen today by Dr. Katragadda. He went over your recent lab results. He will see you back in 4 weeks for labs and follow up.   Thank you for choosing [facility] at [facility] to provide your oncology and hematology care.  To afford each patient quality time with our provider, please arrive at least 15 minutes before your scheduled appointment time.   If you have a lab appointment with the Cancer Center please come in thru the  Main Entrance and check in at the main information desk  You need to re-schedule your appointment should you arrive 10 or more minutes late.  We strive to give you quality time with our providers, and arriving late affects you and other patients whose appointments are after yours.  Also, if you no show three or more times for appointments you may be dismissed from the clinic at the providers discretion.     Again, thank you for choosing [facility].  Our hope is that these requests will decrease the amount of time that you wait before being seen by our physicians.       _____________________________________________________________  Should you have questions after your visit to [facility], please contact our office at [phone] between the hours of 8:00 a.m. and 4:30 p.m.  Voicemails left after 4:00 p.m. will not be returned until the following business day.  For prescription refill requests, have your pharmacy contact our office and allow 72 hours.    Cancer Center Support Programs:   > Cancer Support Group  2nd Tuesday of the month 1pm-2pm, Journey Room    

## 2019-05-23 NOTE — Progress Notes (Signed)
Melissa Sullivan, [place] [address]   CLINIC:  Medical Oncology/Hematology  PCP:  [name], MD [place] [address] [phone]   REASON FOR VISIT: Follow-up for metastatic poorly differentiated adenocarcinoma of the lung to the bones and right adrenal  CURRENT THERAPY:Opidvo every 4 weeks  BRIEF ONCOLOGIC HISTORY:  Oncology History  Cancer of upper lobe of right lung ([place])  07/28/2014 Imaging   CT chest: Large R apical mass consistent with malignancy. This is destroying the R 2nd rib with extension into adjacent soft tissue. R hilar adenopathy with R 5cm adrenal metastatic lesion.   08/01/2014 Initial Biopsy   Lung, needle/core biopsy(ies), right upper lobe - POORLY DIFFERENTIATED ADENOCARCINOMA, SEE COMMENT.   08/08/2014 PET scan   Large hypermetabolic R apical mass with evidence of direct chest wall and mediastinal invasion, right retrocrural lymphadenopathy, extensive retroperitoneal lymphadenopathy, and metastatic lesions to the adrenal glands    09/02/2014 - 11/04/2014 Chemotherapy   Cisplatin/Pemetrexed/Avastin every 21 days x 4 cycles   10/07/2014 - 10/27/2014 Radiation Therapy   Right lung apex for control of brachioplexopathy.   12/24/2014 - 02/25/2015 Chemotherapy   Alimta/Avastin every 21 days.   02/20/2015 Imaging   Increase in size of right adrenal metastasis and subjacent confluent retrocaval lymphadenopathy   02/25/2015 -  Chemotherapy   Nivolumab, zometa   05/04/2015 Imaging   CT CAP- Stable to slight decrease in the posterior right apical lesion. Stable appearance of posterior right upper rib an upper thoracic bony lesions. Slight improvement in right upper lobe tree-in-bud opacity. No new or progressive findings in...   07/28/2015 Imaging   CT CAP- Reduced size of the right apical pleural parenchymal lesion and reduced size of the right adrenal metastatic lesion. Resolution of prior retrocrural adenopathy.   Right eccentric T1 and T2 sclerosis with sclerosis and tapering of the right second..   11/17/2015 Imaging   CT CAP- Stable soft tissue thickening in the apex of the right hemi thorax. Stable right adrenal metastasis. Nodularity along the trachea and mainstem bronchi, relatively new from 07/28/2015, favoring adherent debris.   11/18/2015 Treatment Plan Change   Zometa HELD for upcoming tooth extraction   11/24/2015 Treatment Plan Change   Zometa on hold at this time in preparation for tooth extraction in March 2017.  Zometa las given on 11/18/2015.   02/03/2016 Imaging   CT CAP- Heterogeneous right apical masslike consolidation and right adrenal metastasis are unchanged   04/06/2016 Treatment Plan Change   Zometa restarted 6 weeks out from tooth extraction (04/06/2016)   05/12/2016 Imaging   CT CAP- NED in the chest, abdomen or pelvis.  Some areas of nodularity associated with the mainstem bronchi in the left upper lobe bronchus, favored to represent adherent inspissated secretions   08/17/2016 Imaging   CT CAP- 1. Stable CTs of the chest and abdomen. No evidence of progressive metastatic disease. 2. Probable treated tumor at the right apex, right adrenal gland and T2 vertebral body, stable. 3. Fluctuating nodularity along the walls of the trachea and mainstem bronchi, likely secretions.   12/12/2016 Imaging   Further decrease in size of treated tumor within the right apex. 2. Stable treated tumor involving the right second rib and T2 vertebra. 3. Stable right adrenal gland treated tumor. 4. Emphysema 5. Aortic atherosclerosis   01/04/2017 Imaging   MRI brain- Normal brain MRI.  No intracranial metastatic disease.   03/15/2017 Imaging   CT CAP- 1. No  new or progressive metastatic disease in the chest or abdomen. 2. Stable treated tumor in the apical right upper lobe. Stable treated right posterior second rib and right T2 vertebral lesions. Stable treated right adrenal metastasis. 3.  Aortic atherosclerosis. 4. Moderate emphysema with mild diffuse bronchial wall thickening, suggesting COPD.   06/12/2017 Imaging   CT CAP 1. Similar appearance of treated primary within the right apex. 2. Similar areas of sclerosis within the right second rib, T2, and less so T1 vertebral bodies. These are most consistent with treated metastasis. 3. Similar right adrenal treated metastasis. 4. No evidence of new or progressive disease. 5. Similar right and progressive left areas of bronchial wall thickening and probable mucoid impaction. Correlate with interval infectious symptoms. 6.  Emphysema (ICD10-J43.9). 7. Coronary artery atherosclerosis. Aortic Atherosclerosis (ICD10-I70.0).    10/16/2017 Imaging   CT CAP 1. Stable appearance of the prior Pancoast tumor and related bony findings in the right second rib and right T1 and T2 vertebra compatible with successfully treated tumor. No significant enlargement or new lesions are identified. Similarly the treated right adrenal metastatic lesion is stable in appearance. 2. Upper normal size right hilar lymph node may warrant surveillance. Currently 9 mm in short axis. 3. Other imaging findings of potential clinical significance: Aortic Atherosclerosis (ICD10-I70.0) and Emphysema (ICD10-J43.9). Scattered proximal sigmoid colon diverticula. Mucus plugging medially in the left upper lobe and posteriorly in the right upper lobe.      INTERVAL HISTORY:  Melissa Sullivan 63 y.o. female returns for follow-up of lung cancer.  She is tolerating immunotherapy very well.  She receives treatment every 4 weeks.  Appetite is 75%.  Energy levels are 100%.  She is having trouble eating due to recent dental procedure done.  She is complaining of sinus symptoms with greenish-yellow discharge.  She denies any fevers or chills.  No nausea vomiting diarrhea or constipation.  No shortness of breath on exertion or at rest.  Denies any ER visits or  hospitalizations.  REVIEW OF SYSTEMS:  Review of Systems  HENT:   Positive for trouble swallowing.   Musculoskeletal:       Occasional leg pains.  All other systems reviewed and are negative.    PAST MEDICAL/SURGICAL HISTORY:  Past Medical History:  Diagnosis Date  . Adrenal mass, right ([facility]) 07/28/2014  . Anemia   . Bone metastases ([facility]) 04/05/2016  . Cancer ([facility])    lung  right  . Diabetes mellitus without complication ([facility])   . GERD (gastroesophageal reflux disease)   . Hyperlipidemia   . Hypertension   . Hypothyroidism due to medication 01/30/2017  . Lung mass 07/28/2014  . Reflux    Past Surgical History:  Procedure Laterality Date  . AMPUTATION Right 02/22/2017   Procedure: PARTIAL AMPUTATION RIGHT GREAT TOE;  Surgeon: Caprice Beaver, DPM;  Location: [facility];  Service: Podiatry;  Laterality: Right;  . APPENDECTOMY    . ESOPHAGOGASTRODUODENOSCOPY N/A 12/09/2014   CWC:BJSEGB esophageal stricture/mild-to-noderate erosive gastritis. negative H.pylori  . FLEXIBLE SIGMOIDOSCOPY  2011   Dr. [name]: hyperplastic polyp  . LUNG BIOPSY Right 07/2014   CT guided  . MALONEY DILATION N/A 12/09/2014   Procedure: [name] DILATION;  Surgeon: [name], MD;  Location: [facility];  Service: Endoscopy;  Laterality: N/A;  . PORTACATH PLACEMENT Left 09/01/2014  . SAVORY DILATION N/A 12/09/2014   Procedure: SAVORY DILATION;  Surgeon: [name], MD;  Location: [facility];  Service: Endoscopy;  Laterality: N/A;  SOCIAL HISTORY:  Social History   Socioeconomic History  . Marital status: Married    Spouse name: Not on file  . Number of children: Not on file  . Years of education: Not on file  . Highest education level: Not on file  Occupational History  . Not on file  Social Needs  . Financial resource strain: Not on file  . Food insecurity    Worry: Not on file    Inability: Not on file  . Transportation needs    Medical: Not on file    Non-medical: Not on file   Tobacco Use  . Smoking status: Light Tobacco Smoker    Packs/day: 0.30    Years: 20.00    Pack years: 6.00    Types: Cigarettes  . Smokeless tobacco: Never Used  Substance and Sexual Activity  . Alcohol use: No  . Drug use: No  . Sexual activity: Not on file  Lifestyle  . Physical activity    Days per week: Not on file    Minutes per session: Not on file  . Stress: Not on file  Relationships  . Social Herbalist on phone: Not on file    Gets together: Not on file    Attends religious service: Not on file    Active member of club or organization: Not on file    Attends meetings of clubs or organizations: Not on file    Relationship status: Not on file  . Intimate partner violence    Fear of current or ex partner: Not on file    Emotionally abused: Not on file    Physically abused: Not on file    Forced sexual activity: Not on file  Other Topics Concern  . Not on file  Social History Narrative  . Not on file    FAMILY HISTORY:  Family History  Problem Relation Age of Onset  . Cancer Sister   . Colon cancer Neg Hx     CURRENT MEDICATIONS:  Outpatient Encounter Medications as of 05/23/2019  Medication Sig Note  . Calcium Carb-Cholecalciferol (CALCIUM 1000 + D PO) Take 1 tablet by mouth daily.   . chlorhexidine (PERIDEX) 0.12 % solution    . dronabinol (MARINOL) 2.5 MG capsule Take 1 capsule (2.5 mg total) by mouth 2 (two) times daily before lunch and supper.   Marland Kitchen ENSURE (ENSURE) Take 1 Can by mouth 4 (four) times daily.   . Fluticasone-Salmeterol (ADVAIR DISKUS) 500-50 MCG/DOSE AEPB Inhale 1 puff into the lungs 2 (two) times daily. (Patient taking differently: Inhale 1 puff into the lungs 2 (two) times daily as needed (for respiratory issues.). )   . ibuprofen (ADVIL,MOTRIN) 200 MG tablet Take 400 mg by mouth every 8 (eight) hours as needed (for pain.).   Marland Kitchen levothyroxine (SYNTHROID) 25 MCG tablet TAKE 1/2 (ONE-HALF) TABLET BY MOUTH ONCE DAILY BEFORE BREAKFAST    . lidocaine-prilocaine (EMLA) cream APPLY A QUARTER SIZE AMOUNT TO PORT SITE 1 HOUR PRIOR TO CHEMO.  DO NOT RUB IN.  COVER WITH PLASTIC WRAP   . magic mouthwash SOLN Take 5 mLs by mouth 4 (four) times daily as needed for mouth pain.   . Nivolumab (OPDIVO IV) Inject into the vein every 28 (twenty-eight) days.   Marland Kitchen omeprazole (PRILOSEC) 40 MG capsule Take 1 capsule (40 mg total) by mouth daily. TAKE 30 MINS PRIOR TO YOUR FIRST MEAL.   Marland Kitchen ondansetron (ZOFRAN) 8 MG tablet Take 1 tablet (8 mg total) by  mouth every 8 (eight) hours as needed for nausea or vomiting.   . Oxycodone HCl 10 MG TABS Take 1-2 tablets (10-20 mg total) by mouth every 12 (twelve) hours as needed (pain.).   Marland Kitchen simvastatin (ZOCOR) 40 MG tablet TAKE ONE TABLET BY MOUTH ONCE DAILY IN THE MORNING   . [DISCONTINUED] amoxicillin (AMOXIL) 500 MG capsule    . [DISCONTINUED] amoxicillin (AMOXIL) 875 MG tablet Take 875 mg by mouth every 12 (twelve) hours.   Marland Kitchen amoxicillin (AMOXIL) 500 MG capsule Take 1 capsule (500 mg total) by mouth 3 (three) times daily.   Marland Kitchen denosumab (XGEVA) 120 MG/1.7ML SOLN injection Inject 120 mg into the skin every 28 (twenty-eight) days. 11/08/2018: Hold per MD until dental work is complete.    Facility-Administered Encounter Medications as of 05/23/2019  Medication  . sodium chloride flush (NS) 0.9 % injection 10 mL    ALLERGIES:  Allergies  Allergen Reactions  . Xgeva [Denosumab]     osteonecrosis     PHYSICAL EXAM:  ECOG Performance status: 1  Vitals:   05/23/19 0926  BP: 136/80  Pulse: (!) 102  Resp: 18  Temp: (!) 97.5 F (36.4 C)  SpO2: 100%   Filed Weights   05/23/19 0926  Weight: 95 lb 11.2 oz (43.4 kg)    Physical Exam Constitutional:      Appearance: Normal appearance. She is normal weight.  Cardiovascular:     Rate and Rhythm: Normal rate and regular rhythm.     Heart sounds: Normal heart sounds.  Pulmonary:     Effort: Pulmonary effort is normal.     Breath sounds: Normal breath  sounds.  Abdominal:     General: There is no distension.     Palpations: Abdomen is soft. There is no mass.  Musculoskeletal: Normal range of motion.  Skin:    General: Skin is warm and dry.  Neurological:     Mental Status: She is alert and oriented to person, place, and time. Mental status is at baseline.  Psychiatric:        Mood and Affect: Mood normal.        Behavior: Behavior normal.      LABORATORY DATA:  I have reviewed the labs as listed.  CBC    Component Value Date/Time   WBC 7.4 05/23/2019 0843   RBC 4.40 05/23/2019 0843   HGB 13.4 05/23/2019 0843   HCT 42.4 05/23/2019 0843   PLT 267 05/23/2019 0843   MCV 96.4 05/23/2019 0843   MCH 30.5 05/23/2019 0843   MCHC 31.6 05/23/2019 0843   RDW 15.0 05/23/2019 0843   LYMPHSABS 2.3 05/23/2019 0843   MONOABS 0.7 05/23/2019 0843   EOSABS 0.2 05/23/2019 0843   BASOSABS 0.0 05/23/2019 0843   CMP Latest Ref Rng & Units 05/23/2019 04/25/2019 03/28/2019  Glucose 70 - 99 mg/dL 96 87 78  BUN 8 - 23 mg/dL 13 15 12   Creatinine 0.44 - 1.00 mg/dL 1.33(H) 1.34(H) 1.15(H)  Sodium 135 - 145 mmol/L 141 138 139  Potassium 3.5 - 5.1 mmol/L 3.4(L) 3.5 4.0  Chloride 98 - 111 mmol/L 105 103 103  CO2 22 - 32 mmol/L 24 24 26   Calcium 8.9 - 10.3 mg/dL 9.4 9.7 10.0  Total Protein 6.5 - 8.1 g/dL 7.7 7.6 7.7  Total Bilirubin 0.3 - 1.2 mg/dL 0.7 0.2(L) 0.3  Alkaline Phos 38 - 126 U/L 73 63 66  AST 15 - 41 U/L 21 21 22   ALT 0 - 44 U/L 18  15 20       DIAGNOSTIC IMAGING:  I have independently reviewed the scans and discussed with the patient.     ASSESSMENT & PLAN:   Cancer of upper lobe of right lung 1.  Metastatic adenocarcinoma of the lung to the bones and right adrenal: - Foundation 1 testing shows 14 genomic alterations, no approved FDA therapies - On Opdivo 40 mg monthly started on 02/25/2015. - Last CT CAP on 01/28/2019 shows stable exam with stable T2 sclerotic lesion and partially calcified right adrenal nodule.  Several small  pulmonary nodules throughout both lungs are stable. - She is continuing to tolerate immunotherapy very well.  Denies any immunotherapy related side effects including diarrhea or pneumonitis. - We have reviewed blood work.  She may proceed with her treatment today.  We will see her back in 3 to 4 weeks with repeat CT scans. -She is also using Marinol twice daily for appetite.  2.  Sinus symptoms: -She is complaining of greenish-yellow discharge from sinuses.  We will start her on amoxicillin 500 mg 3 times a day for 7 days.  3.  Back pain and leg pain: -She takes oxycodone 1 tablet every other day as needed.  4.  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 -She had extensive surgery recently.  She is not able to eat well and lost few pounds.  However she is feeling better at this time.     Total time spent is 25 minutes with more than 50% of the time spent face-to-face discussing diagnosis treatment plan and coordination of care. Orders placed this encounter:  Orders Placed This Encounter  Procedures  . CT Abdomen Pelvis W Contrast  . CT Chest W Contrast      Derek Jack, MD [place] [phone]

## 2019-05-23 NOTE — Assessment & Plan Note (Signed)
1.  Metastatic adenocarcinoma of the lung to the bones and right adrenal: - Foundation 1 testing shows 14 genomic alterations, no approved FDA therapies - On Opdivo 40 mg monthly started on 02/25/2015. - Last CT CAP on 01/28/2019 shows stable exam with stable T2 sclerotic lesion and partially calcified right adrenal nodule.  Several small pulmonary nodules throughout both lungs are stable. - She is continuing to tolerate immunotherapy very well.  Denies any immunotherapy related side effects including diarrhea or pneumonitis. - We have reviewed blood work.  She may proceed with her treatment today.  We will see her back in 3 to 4 weeks with repeat CT scans. -She is also using Marinol twice daily for appetite.  2.  Sinus symptoms: -She is complaining of greenish-yellow discharge from sinuses.  We will start her on amoxicillin 500 mg 3 times a day for 7 days.  3.  Back pain and leg pain: -She takes oxycodone 1 tablet every other day as needed.  4.  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 -She had extensive surgery recently.  She is not able to eat well and lost few pounds.  However she is feeling better at this time.

## 2019-05-23 NOTE — Progress Notes (Signed)
Pt presents today for office visit and treatment. VSS No complaints of any changes since the last visit. Pt states that she has some sinus/ allergy issues. Pt denies any cough, fevers, sore throat and SOB.   Treatment given today per MD orders. Tolerated infusion without adverse affects. Vital signs stable. No complaints at this time. Discharged from clinic ambulatory. F/U with [facility] as scheduled.

## 2019-06-14 DIAGNOSIS — C3490 Malignant neoplasm of unspecified part of unspecified bronchus or lung: Secondary | ICD-10-CM | POA: Diagnosis not present

## 2019-06-14 DIAGNOSIS — G629 Polyneuropathy, unspecified: Secondary | ICD-10-CM | POA: Diagnosis not present

## 2019-06-14 DIAGNOSIS — B351 Tinea unguium: Secondary | ICD-10-CM | POA: Diagnosis not present

## 2019-06-17 ENCOUNTER — Ambulatory Visit (HOSPITAL_COMMUNITY)
Admission: RE | Admit: 2019-06-17 | Discharge: 2019-06-17 | Disposition: A | Discharge status: Home / Self Care | Payer: Medicare HMO | Source: Ambulatory Visit | Attending: Hematology | Admitting: Hematology

## 2019-06-17 ENCOUNTER — Other Ambulatory Visit: Payer: Self-pay

## 2019-06-17 DIAGNOSIS — C3411 Malignant neoplasm of upper lobe, right bronchus or lung: Secondary | ICD-10-CM | POA: Diagnosis present

## 2019-06-17 DIAGNOSIS — C7971 Secondary malignant neoplasm of right adrenal gland: Secondary | ICD-10-CM | POA: Diagnosis not present

## 2019-06-17 DIAGNOSIS — Z85118 Personal history of other malignant neoplasm of bronchus and lung: Secondary | ICD-10-CM | POA: Diagnosis not present

## 2019-06-17 MED ORDER — IOHEXOL 300 MG/ML  SOLN
100.0000 mL | Freq: Once | INTRAMUSCULAR | Status: AC | PRN
  Administered 2019-06-17: 10:00:00 100 mL via INTRAVENOUS

## 2019-06-18 DIAGNOSIS — J45998 Other asthma: Secondary | ICD-10-CM | POA: Diagnosis not present

## 2019-06-18 DIAGNOSIS — R634 Abnormal weight loss: Secondary | ICD-10-CM | POA: Diagnosis not present

## 2019-06-18 DIAGNOSIS — R05 Cough: Secondary | ICD-10-CM | POA: Diagnosis not present

## 2019-06-18 DIAGNOSIS — C3490 Malignant neoplasm of unspecified part of unspecified bronchus or lung: Secondary | ICD-10-CM | POA: Diagnosis not present

## 2019-06-18 DIAGNOSIS — D649 Anemia, unspecified: Secondary | ICD-10-CM | POA: Diagnosis not present

## 2019-06-18 DIAGNOSIS — M87180 Osteonecrosis due to drugs, jaw: Secondary | ICD-10-CM | POA: Diagnosis not present

## 2019-06-18 DIAGNOSIS — E440 Moderate protein-calorie malnutrition: Secondary | ICD-10-CM | POA: Diagnosis not present

## 2019-06-18 DIAGNOSIS — C799 Secondary malignant neoplasm of unspecified site: Secondary | ICD-10-CM | POA: Diagnosis not present

## 2019-06-18 DIAGNOSIS — R1310 Dysphagia, unspecified: Secondary | ICD-10-CM | POA: Diagnosis not present

## 2019-06-20 ENCOUNTER — Inpatient Hospital Stay (HOSPITAL_BASED_OUTPATIENT_CLINIC_OR_DEPARTMENT_OTHER): Payer: Medicare HMO | Admitting: Hematology

## 2019-06-20 ENCOUNTER — Inpatient Hospital Stay (HOSPITAL_COMMUNITY): Payer: Medicare HMO

## 2019-06-20 ENCOUNTER — Encounter (HOSPITAL_COMMUNITY): Payer: Self-pay | Admitting: Hematology

## 2019-06-20 ENCOUNTER — Other Ambulatory Visit: Payer: Self-pay

## 2019-06-20 VITALS — BP 108/53 | HR 80 | Resp 16

## 2019-06-20 DIAGNOSIS — M549 Dorsalgia, unspecified: Secondary | ICD-10-CM

## 2019-06-20 DIAGNOSIS — M879 Osteonecrosis, unspecified: Secondary | ICD-10-CM

## 2019-06-20 DIAGNOSIS — C7951 Secondary malignant neoplasm of bone: Secondary | ICD-10-CM | POA: Diagnosis not present

## 2019-06-20 DIAGNOSIS — E278 Other specified disorders of adrenal gland: Secondary | ICD-10-CM

## 2019-06-20 DIAGNOSIS — I2510 Atherosclerotic heart disease of native coronary artery without angina pectoris: Secondary | ICD-10-CM | POA: Diagnosis not present

## 2019-06-20 DIAGNOSIS — E032 Hypothyroidism due to medicaments and other exogenous substances: Secondary | ICD-10-CM

## 2019-06-20 DIAGNOSIS — J439 Emphysema, unspecified: Secondary | ICD-10-CM | POA: Diagnosis not present

## 2019-06-20 DIAGNOSIS — Z79899 Other long term (current) drug therapy: Secondary | ICD-10-CM

## 2019-06-20 DIAGNOSIS — M79606 Pain in leg, unspecified: Secondary | ICD-10-CM | POA: Diagnosis not present

## 2019-06-20 DIAGNOSIS — Z5112 Encounter for antineoplastic immunotherapy: Secondary | ICD-10-CM | POA: Diagnosis not present

## 2019-06-20 DIAGNOSIS — C3411 Malignant neoplasm of upper lobe, right bronchus or lung: Secondary | ICD-10-CM | POA: Diagnosis not present

## 2019-06-20 DIAGNOSIS — F17210 Nicotine dependence, cigarettes, uncomplicated: Secondary | ICD-10-CM

## 2019-06-20 DIAGNOSIS — I700 Atherosclerosis of aorta: Secondary | ICD-10-CM | POA: Diagnosis not present

## 2019-06-20 DIAGNOSIS — C7971 Secondary malignant neoplasm of right adrenal gland: Secondary | ICD-10-CM

## 2019-06-20 DIAGNOSIS — Z9221 Personal history of antineoplastic chemotherapy: Secondary | ICD-10-CM

## 2019-06-20 DIAGNOSIS — Z809 Family history of malignant neoplasm, unspecified: Secondary | ICD-10-CM

## 2019-06-20 DIAGNOSIS — Z923 Personal history of irradiation: Secondary | ICD-10-CM

## 2019-06-20 LAB — CBC WITH DIFFERENTIAL/PLATELET
Abs Immature Granulocytes: 0.02 10*3/uL (ref 0.00–0.07)
Basophils Absolute: 0 10*3/uL (ref 0.0–0.1)
Basophils Relative: 0 %
Eosinophils Absolute: 0.1 10*3/uL (ref 0.0–0.5)
Eosinophils Relative: 2 %
HCT: 41.8 % (ref 36.0–46.0)
Hemoglobin: 13.4 g/dL (ref 12.0–15.0)
Immature Granulocytes: 0 %
Lymphocytes Relative: 29 %
Lymphs Abs: 2.1 10*3/uL (ref 0.7–4.0)
MCH: 30.8 pg (ref 26.0–34.0)
MCHC: 32.1 g/dL (ref 30.0–36.0)
MCV: 96.1 fL (ref 80.0–100.0)
Monocytes Absolute: 0.5 10*3/uL (ref 0.1–1.0)
Monocytes Relative: 7 %
Neutro Abs: 4.4 10*3/uL (ref 1.7–7.7)
Neutrophils Relative %: 62 %
Platelets: 265 10*3/uL (ref 150–400)
RBC: 4.35 MIL/uL (ref 3.87–5.11)
RDW: 14.8 % (ref 11.5–15.5)
WBC: 7.1 10*3/uL (ref 4.0–10.5)
nRBC: 0 % (ref 0.0–0.2)

## 2019-06-20 LAB — COMPREHENSIVE METABOLIC PANEL
ALT: 14 U/L (ref 0–44)
AST: 19 U/L (ref 15–41)
Albumin: 4.1 g/dL (ref 3.5–5.0)
Alkaline Phosphatase: 65 U/L (ref 38–126)
Anion gap: 8 (ref 5–15)
BUN: 14 mg/dL (ref 8–23)
CO2: 25 mmol/L (ref 22–32)
Calcium: 9.7 mg/dL (ref 8.9–10.3)
Chloride: 105 mmol/L (ref 98–111)
Creatinine, Ser: 1.25 mg/dL — ABNORMAL HIGH (ref 0.44–1.00)
GFR calc Af Amer: 53 mL/min — ABNORMAL LOW (ref >60)
GFR calc non Af Amer: 46 mL/min — ABNORMAL LOW (ref >60)
Glucose, Bld: 105 mg/dL — ABNORMAL HIGH (ref 70–99)
Potassium: 3.7 mmol/L (ref 3.5–5.1)
Sodium: 138 mmol/L (ref 135–145)
Total Bilirubin: 0.5 mg/dL (ref 0.3–1.2)
Total Protein: 8.1 g/dL (ref 6.5–8.1)

## 2019-06-20 LAB — LACTATE DEHYDROGENASE: LDH: 114 U/L (ref 98–192)

## 2019-06-20 LAB — TSH: TSH: 1.153 u[IU]/mL (ref 0.350–4.500)

## 2019-06-20 MED ORDER — SODIUM CHLORIDE 0.9 % IV SOLN
Freq: Once | INTRAVENOUS | Status: AC
  Administered 2019-06-20: 11:00:00 via INTRAVENOUS

## 2019-06-20 MED ORDER — SODIUM CHLORIDE 0.9% FLUSH
10.0000 mL | INTRAVENOUS | Status: DC | PRN
  Administered 2019-06-20: 10 mL
  Filled 2019-06-20: qty 10

## 2019-06-20 MED ORDER — HEPARIN SOD (PORK) LOCK FLUSH 100 UNIT/ML IV SOLN
500.0000 [IU] | Freq: Once | INTRAVENOUS | Status: AC | PRN
  Administered 2019-06-20: 500 [IU]

## 2019-06-20 MED ORDER — SODIUM CHLORIDE 0.9 % IV SOLN
480.0000 mg | Freq: Once | INTRAVENOUS | Status: AC
  Administered 2019-06-20: 480 mg via INTRAVENOUS
  Filled 2019-06-20: qty 48

## 2019-06-20 NOTE — Progress Notes (Signed)
Labs reviewed with MD, proceed today as planned per MD.  Treatment given per orders. Patient tolerated it well without problems. Vitals stable and discharged home from clinic ambulatory. Follow up as scheduled.

## 2019-06-20 NOTE — Patient Instructions (Addendum)
[place] at [facility] Discharge Instructions  You were seen today by Dr. Delton Coombes. He went over your recent results. He will see you back in 4 weeks for labs and follow up.   Thank you for choosing [place] at [facility] to provide your oncology and hematology care.  To afford each patient quality time with our provider, please arrive at least 15 minutes before your scheduled appointment time.   If you have a lab appointment with the [facility] please come in thru the  Main Entrance and check in at the main information desk  You need to re-schedule your appointment should you arrive 10 or more minutes late.  We strive to give you quality time with our providers, and arriving late affects you and other patients whose appointments are after yours.  Also, if you no show three or more times for appointments you may be dismissed from the clinic at the providers discretion.     Again, thank you for choosing [facility].  Our hope is that these requests will decrease the amount of time that you wait before being seen by our physicians.       _____________________________________________________________  Should you have questions after your visit to [facility], please contact our office at [phone] between the hours of 8:00 a.m. and 4:30 p.m.  Voicemails left after 4:00 p.m. will not be returned until the following business day.  For prescription refill requests, have your pharmacy contact our office and allow 72 hours.    Cancer Center Support Programs:   > Cancer Support Group  2nd Tuesday of the month 1pm-2pm, Journey Room

## 2019-06-20 NOTE — Progress Notes (Signed)
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 This is destroying the R 2nd rib with extension into adjacent soft tissue. R hilar adenopathy with R 5cm adrenal metastatic lesion.   08/01/2014 Initial Biopsy   Lung, needle/core biopsy(ies), right upper lobe - POORLY DIFFERENTIATED ADENOCARCINOMA, SEE COMMENT.   08/08/2014 PET scan   Large hypermetabolic R apical mass with evidence of direct chest wall and mediastinal invasion, right retrocrural lymphadenopathy, extensive retroperitoneal lymphadenopathy, and metastatic lesions to the adrenal glands    09/02/2014 - 11/04/2014 Chemotherapy   Cisplatin/Pemetrexed/Avastin every 21 days x 4 cycles   10/07/2014 - 10/27/2014 Radiation Therapy   Right lung apex for control of brachioplexopathy.   12/24/2014 - 02/25/2015 Chemotherapy   Alimta/Avastin every 21 days.   02/20/2015 Imaging   Increase in size of right adrenal metastasis and subjacent confluent retrocaval lymphadenopathy   02/25/2015 -  Chemotherapy   Nivolumab, zometa   05/04/2015 Imaging   CT CAP- Stable to slight decrease in the posterior right apical lesion. Stable appearance of posterior right upper rib an upper thoracic bony lesions. Slight improvement in right upper lobe tree-in-bud opacity. No new or progressive findings in...   07/28/2015 Imaging   CT CAP- Reduced size of the right apical pleural parenchymal lesion and reduced size of the right adrenal metastatic lesion. Resolution of prior retrocrural adenopathy.   Right eccentric T1 and T2 sclerosis with sclerosis and tapering of the right second..   11/17/2015 Imaging   CT CAP- Stable soft tissue thickening in the apex of the right hemi thorax. Stable right adrenal metastasis. Nodularity along the trachea and mainstem bronchi, relatively new from 07/28/2015, favoring adherent debris.   11/18/2015 Treatment Plan Change   Zometa HELD for upcoming tooth extraction   11/24/2015 Treatment Plan Change   Zometa on hold at this time in preparation for tooth extraction in March 2017.  Zometa las given on 11/18/2015.   02/03/2016 Imaging   CT CAP- Heterogeneous right apical masslike consolidation and right adrenal metastasis are unchanged   04/06/2016 Treatment Plan Change   Zometa restarted 6 weeks out from tooth extraction (04/06/2016)   05/12/2016 Imaging   CT CAP- NED in the chest, abdomen or pelvis.  Some areas of nodularity associated with the mainstem bronchi in the left upper lobe bronchus, favored to represent adherent inspissated secretions   08/17/2016 Imaging   CT CAP- 1. Stable CTs of the chest and abdomen. No evidence of progressive metastatic disease. 2. Probable treated tumor at the right apex, right adrenal gland and T2 vertebral body, stable. 3. Fluctuating nodularity along the walls of the trachea and mainstem bronchi, likely secretions.   12/12/2016 Imaging   Further decrease in size of treated tumor within the right apex. 2. Stable treated tumor involving the right second rib and T2 vertebra. 3. Stable right adrenal gland treated tumor. 4. Emphysema 5. Aortic atherosclerosis   01/04/2017 Imaging   MRI brain- Normal brain MRI.  No intracranial metastatic disease.   03/15/2017 Imaging   CT CAP- 1. No  new or progressive metastatic disease in the chest or abdomen. 2. Stable treated tumor in the apical right upper lobe. Stable treated right posterior second rib and right T2 vertebral lesions. Stable treated right adrenal metastasis. 3.  Aortic atherosclerosis. 4. Moderate emphysema with mild diffuse bronchial wall thickening, suggesting COPD.   06/12/2017 Imaging   CT CAP 1. Similar appearance of treated primary within the right apex. 2. Similar areas of sclerosis within the right second rib, T2, and less so T1 vertebral bodies. These are most consistent with treated metastasis. 3. Similar right adrenal treated metastasis. 4. No evidence of new or progressive disease. 5. Similar right and progressive left areas of bronchial wall thickening and probable mucoid impaction. Correlate with interval infectious symptoms. 6.  Emphysema (ICD10-J43.9). 7. Coronary artery atherosclerosis. Aortic Atherosclerosis (ICD10-I70.0).    10/16/2017 Imaging   CT CAP 1. Stable appearance of the prior Pancoast tumor and related bony findings in the right second rib and right T1 and T2 vertebra compatible with successfully treated tumor. No significant enlargement or new lesions are identified. Similarly the treated right adrenal metastatic lesion is stable in appearance. 2. Upper normal size right hilar lymph node may warrant surveillance. Currently 9 mm in short axis. 3. Other imaging findings of potential clinical significance: Aortic Atherosclerosis (ICD10-I70.0) and Emphysema (ICD10-J43.9). Scattered proximal sigmoid colon diverticula. Mucus plugging medially in the left upper lobe and posteriorly in the right upper lobe.      INTERVAL HISTORY:  Melissa Sullivan 63 y.o. female seen for follow-up of metastatic lung cancer to bones and adrenal gland.  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  She had CT scans done on 06/17/2019.  Appetite is 50%.  Energy levels are 75%.  Chronic cough is stable.  She has some trouble swallowing due to recent oral surgery.  Denies any nausea, vomiting or diarrhea or constipation.  Denies any skin rashes.  No ER visits or hospitalizations.  REVIEW OF SYSTEMS:  Review of Systems  HENT:    Positive for trouble swallowing.   Respiratory: Positive for cough.   Musculoskeletal:       Occasional leg pains.  All other systems reviewed and are negative.    PAST MEDICAL/SURGICAL HISTORY:  Past Medical History:  Diagnosis Date  . Adrenal mass, right ([place]) 07/28/2014  . Anemia   . Bone metastases ([place]) 04/05/2016  . Cancer ([place])    lung  right  . Diabetes mellitus without complication ([place])   . GERD (gastroesophageal reflux disease)   . Hyperlipidemia   . Hypertension   . Hypothyroidism due to medication 01/30/2017  . Lung mass 07/28/2014  . Reflux    Past Surgical History:  Procedure Laterality Date  . AMPUTATION Right 02/22/2017   Procedure: PARTIAL AMPUTATION RIGHT GREAT TOE;  Surgeon: Caprice Beaver, DPM;  Location: [facility];  Service: Podiatry;  Laterality: Right;  . APPENDECTOMY    . ESOPHAGOGASTRODUODENOSCOPY N/A 12/09/2014   TKZ:SWFUXN esophageal stricture/mild-to-noderate erosive gastritis. negative H.pylori  . FLEXIBLE SIGMOIDOSCOPY  2011   Dr. [name]: hyperplastic polyp  . LUNG BIOPSY Right 07/2014   CT guided  . MALONEY DILATION N/A 12/09/2014   Procedure: [name] DILATION;  Surgeon: [name], MD;  Location: [facility];  Service: Endoscopy;  Laterality: N/A;  . PORTACATH PLACEMENT Left 09/01/2014  . SAVORY DILATION N/A 12/09/2014   Procedure: SAVORY DILATION;  Surgeon: [name], MD;  Location: [facility];  Service: Endoscopy;  Laterality: N/A;     SOCIAL  HISTORY:  Social History   Socioeconomic History  . Marital status: Married    Spouse name: Not on file  . Number of children: Not on file  . Years of education: Not on file  . Highest education level: Not on file  Occupational History  . Not on file  Social Needs  . Financial resource strain: Not on file  . Food insecurity    Worry: Not on file    Inability: Not on file  . Transportation needs    Medical: Not on file    Non-medical: Not on file  Tobacco Use  . Smoking status:  Light Tobacco Smoker    Packs/day: 0.30    Years: 20.00    Pack years: 6.00    Types: Cigarettes  . Smokeless tobacco: Never Used  Substance and Sexual Activity  . Alcohol use: No  . Drug use: No  . Sexual activity: Not on file  Lifestyle  . Physical activity    Days per week: Not on file    Minutes per session: Not on file  . Stress: Not on file  Relationships  . Social Herbalist on phone: Not on file    Gets together: Not on file    Attends religious service: Not on file    Active member of club or organization: Not on file    Attends meetings of clubs or organizations: Not on file    Relationship status: Not on file  . Intimate partner violence    Fear of current or ex partner: Not on file    Emotionally abused: Not on file    Physically abused: Not on file    Forced sexual activity: Not on file  Other Topics Concern  . Not on file  Social History Narrative  . Not on file    FAMILY HISTORY:  Family History  Problem Relation Age of Onset  . Cancer Sister   . Colon cancer Neg Hx     CURRENT MEDICATIONS:  Outpatient Encounter Medications as of 06/20/2019  Medication Sig Note  . Calcium Carb-Cholecalciferol (CALCIUM 1000 + D PO) Take 1 tablet by mouth daily.   . chlorhexidine (PERIDEX) 0.12 % solution Use as directed 5 mLs in the mouth or throat as needed.    . dronabinol (MARINOL) 2.5 MG capsule Take 1 capsule (2.5 mg total) by mouth 2 (two) times daily before lunch and supper.   Marland Kitchen ENSURE (ENSURE) Take 1 Can by mouth 4 (four) times daily.   . Fluticasone-Salmeterol (ADVAIR DISKUS) 500-50 MCG/DOSE AEPB Inhale 1 puff into the lungs 2 (two) times daily. (Patient taking differently: Inhale 1 puff into the lungs 2 (two) times daily as needed (for respiratory issues.). )   . ibuprofen (ADVIL,MOTRIN) 200 MG tablet Take 400 mg by mouth every 8 (eight) hours as needed (for pain.).   Marland Kitchen levocetirizine (XYZAL) 5 MG tablet TAKE 1 TABLET BY MOUTH AT BEDTIME FOR ALLERGIES  COUGH   . levothyroxine (SYNTHROID) 25 MCG tablet TAKE 1/2 (ONE-HALF) TABLET BY MOUTH ONCE DAILY BEFORE BREAKFAST   . lidocaine-prilocaine (EMLA) cream APPLY A QUARTER SIZE AMOUNT TO PORT SITE 1 HOUR PRIOR TO CHEMO.  DO NOT RUB IN.  COVER WITH PLASTIC WRAP   . magic mouthwash SOLN Take 5 mLs by mouth 4 (four) times daily as needed for mouth pain.   . Nivolumab (OPDIVO IV) Inject into the vein every 28 (twenty-eight) days.   Marland Kitchen omeprazole (PRILOSEC) 40 MG capsule Take 1 capsule (  40 mg total) by mouth daily. TAKE 30 MINS PRIOR TO YOUR FIRST MEAL.   Marland Kitchen ondansetron (ZOFRAN) 8 MG tablet Take 1 tablet (8 mg total) by mouth every 8 (eight) hours as needed for nausea or vomiting.   . Oxycodone HCl 10 MG TABS Take 1-2 tablets (10-20 mg total) by mouth every 12 (twelve) hours as needed (pain.).   Marland Kitchen simvastatin (ZOCOR) 40 MG tablet TAKE ONE TABLET BY MOUTH ONCE DAILY IN THE MORNING   . [DISCONTINUED] amoxicillin (AMOXIL) 500 MG capsule Take 1 capsule (500 mg total) by mouth 3 (three) times daily.   Marland Kitchen denosumab (XGEVA) 120 MG/1.7ML SOLN injection Inject 120 mg into the skin every 28 (twenty-eight) days. 11/08/2018: Hold per MD until dental work is complete.    Facility-Administered Encounter Medications as of 06/20/2019  Medication  . sodium chloride flush (NS) 0.9 % injection 10 mL    ALLERGIES:  Allergies  Allergen Reactions  . Xgeva [Denosumab]     osteonecrosis     PHYSICAL EXAM:  ECOG Performance status: 1  Vitals:   06/20/19 0959  BP: 132/64  Pulse: 97  Resp: 18  Temp: 98.4 F (36.9 C)  SpO2: 100%   Filed Weights   06/20/19 0959  Weight: 95 lb (43.1 kg)    Physical Exam Constitutional:      Appearance: Normal appearance. She is normal weight.  Cardiovascular:     Rate and Rhythm: Normal rate and regular rhythm.     Heart sounds: Normal heart sounds.  Pulmonary:     Effort: Pulmonary effort is normal.     Breath sounds: Normal breath sounds.  Abdominal:     General: There is  no distension.     Palpations: Abdomen is soft. There is no mass.  Musculoskeletal: Normal range of motion.  Skin:    General: Skin is warm and dry.  Neurological:     Mental Status: She is alert and oriented to person, place, and time. Mental status is at baseline.  Psychiatric:        Mood and Affect: Mood normal.        Behavior: Behavior normal.      LABORATORY DATA:  I have reviewed the labs as listed.  CBC    Component Value Date/Time   WBC 7.1 06/20/2019 0856   RBC 4.35 06/20/2019 0856   HGB 13.4 06/20/2019 0856   HCT 41.8 06/20/2019 0856   PLT 265 06/20/2019 0856   MCV 96.1 06/20/2019 0856   MCH 30.8 06/20/2019 0856   MCHC 32.1 06/20/2019 0856   RDW 14.8 06/20/2019 0856   LYMPHSABS 2.1 06/20/2019 0856   MONOABS 0.5 06/20/2019 0856   EOSABS 0.1 06/20/2019 0856   BASOSABS 0.0 06/20/2019 0856   CMP Latest Ref Rng & Units 06/20/2019 05/23/2019 04/25/2019  Glucose 70 - 99 mg/dL 105(H) 96 87  BUN 8 - 23 mg/dL 14 13 15   Creatinine 0.44 - 1.00 mg/dL 1.25(H) 1.33(H) 1.34(H)  Sodium 135 - 145 mmol/L 138 141 138  Potassium 3.5 - 5.1 mmol/L 3.7 3.4(L) 3.5  Chloride 98 - 111 mmol/L 105 105 103  CO2 22 - 32 mmol/L 25 24 24   Calcium 8.9 - 10.3 mg/dL 9.7 9.4 9.7  Total Protein 6.5 - 8.1 g/dL 8.1 7.7 7.6  Total Bilirubin 0.3 - 1.2 mg/dL 0.5 0.7 0.2(L)  Alkaline Phos 38 - 126 U/L 65 73 63  AST 15 - 41 U/L 19 21 21   ALT 0 - 44 U/L 14 18 15  DIAGNOSTIC IMAGING:  I have independently reviewed the scans and discussed with the patient.     ASSESSMENT & PLAN:   Cancer of upper lobe of right lung 1.  Metastatic adenocarcinoma the lung to the bones and right adrenal: - Foundation 1 testing shows 14 genomic alterations, no approved FDA therapies. - Opdivo 400 mg monthly started on 02/25/2015. - She is tolerating immunotherapy very well.  Did not develop any therapy related side effects. -We reviewed results of the CT CAP on 06/17/2019 which shows stable exam with  similar-appearing sclerotic lesion in the T2 vertebral body and partially calcified right adrenal nodule.  No new findings suggest metastatic disease.  New 4 mm right upper lobe lung nodule may be infectious/inflammatory in etiology. - I have reviewed her blood work which is grossly unchanged.  She may proceed with her Opdivo today.  We will see her back in 4 weeks for follow-up.  2.  Back pain and leg pain: -She takes oxycodone 1 tablet every other day as needed.  3.  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 - She had extensive surgery recently.  We will continue to hold it indefinitely.     Total time spent is 25 minutes with more than 50% of the time spent face-to-face discussing diagnosis treatment plan and coordination of care. Orders placed this encounter:  No orders of the defined types were placed in this encounter.     Derek Jack, MD [facility] [phone]

## 2019-06-20 NOTE — Assessment & Plan Note (Signed)
1.  Metastatic adenocarcinoma the lung to the bones and right adrenal: - Foundation 1 testing shows 14 genomic alterations, no approved FDA therapies. - Opdivo 400 mg monthly started on 02/25/2015. - She is tolerating immunotherapy very well.  Did not develop any therapy related side effects. -We reviewed results of the CT CAP on 06/17/2019 which shows stable exam with similar-appearing sclerotic lesion in the T2 vertebral body and partially calcified right adrenal nodule.  No new findings suggest metastatic disease.  New 4 mm right upper lobe lung nodule may be infectious/inflammatory in etiology. - I have reviewed her blood work which is grossly unchanged.  She may proceed with her Opdivo today.  We will see her back in 4 weeks for follow-up.  2.  Back pain and leg pain: -She takes oxycodone 1 tablet every other day as needed.  3.  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 - She had extensive surgery recently.  We will continue to hold it indefinitely.

## 2019-07-18 ENCOUNTER — Other Ambulatory Visit (HOSPITAL_COMMUNITY): Payer: Self-pay | Admitting: *Deleted

## 2019-07-18 ENCOUNTER — Inpatient Hospital Stay (HOSPITAL_COMMUNITY): Payer: Medicare HMO

## 2019-07-18 ENCOUNTER — Other Ambulatory Visit: Payer: Self-pay

## 2019-07-18 ENCOUNTER — Inpatient Hospital Stay (HOSPITAL_COMMUNITY): Payer: Medicare HMO | Attending: Hematology

## 2019-07-18 ENCOUNTER — Inpatient Hospital Stay (HOSPITAL_BASED_OUTPATIENT_CLINIC_OR_DEPARTMENT_OTHER): Payer: Medicare HMO | Admitting: Hematology

## 2019-07-18 VITALS — BP 119/62 | HR 78 | Temp 96.9°F | Resp 18

## 2019-07-18 DIAGNOSIS — E278 Other specified disorders of adrenal gland: Secondary | ICD-10-CM | POA: Insufficient documentation

## 2019-07-18 DIAGNOSIS — Z5112 Encounter for antineoplastic immunotherapy: Secondary | ICD-10-CM | POA: Insufficient documentation

## 2019-07-18 DIAGNOSIS — C3411 Malignant neoplasm of upper lobe, right bronchus or lung: Secondary | ICD-10-CM

## 2019-07-18 DIAGNOSIS — R630 Anorexia: Secondary | ICD-10-CM

## 2019-07-18 LAB — CBC WITH DIFFERENTIAL/PLATELET
Abs Immature Granulocytes: 0.03 10*3/uL (ref 0.00–0.07)
Basophils Absolute: 0 10*3/uL (ref 0.0–0.1)
Basophils Relative: 0 %
Eosinophils Absolute: 0.2 10*3/uL (ref 0.0–0.5)
Eosinophils Relative: 3 %
HCT: 43.7 % (ref 36.0–46.0)
Hemoglobin: 14 g/dL (ref 12.0–15.0)
Immature Granulocytes: 0 %
Lymphocytes Relative: 33 %
Lymphs Abs: 2.7 10*3/uL (ref 0.7–4.0)
MCH: 30.8 pg (ref 26.0–34.0)
MCHC: 32 g/dL (ref 30.0–36.0)
MCV: 96 fL (ref 80.0–100.0)
Monocytes Absolute: 0.7 10*3/uL (ref 0.1–1.0)
Monocytes Relative: 9 %
Neutro Abs: 4.5 10*3/uL (ref 1.7–7.7)
Neutrophils Relative %: 55 %
Platelets: 271 10*3/uL (ref 150–400)
RBC: 4.55 MIL/uL (ref 3.87–5.11)
RDW: 14.8 % (ref 11.5–15.5)
WBC: 8.2 10*3/uL (ref 4.0–10.5)
nRBC: 0 % (ref 0.0–0.2)

## 2019-07-18 LAB — COMPREHENSIVE METABOLIC PANEL
ALT: 15 U/L (ref 0–44)
AST: 19 U/L (ref 15–41)
Albumin: 4.1 g/dL (ref 3.5–5.0)
Alkaline Phosphatase: 61 U/L (ref 38–126)
Anion gap: 10 (ref 5–15)
BUN: 13 mg/dL (ref 8–23)
CO2: 26 mmol/L (ref 22–32)
Calcium: 10.3 mg/dL (ref 8.9–10.3)
Chloride: 105 mmol/L (ref 98–111)
Creatinine, Ser: 1.25 mg/dL — ABNORMAL HIGH (ref 0.44–1.00)
GFR calc Af Amer: 53 mL/min — ABNORMAL LOW (ref >60)
GFR calc non Af Amer: 46 mL/min — ABNORMAL LOW (ref >60)
Glucose, Bld: 68 mg/dL — ABNORMAL LOW (ref 70–99)
Potassium: 4.1 mmol/L (ref 3.5–5.1)
Sodium: 141 mmol/L (ref 135–145)
Total Bilirubin: 0.5 mg/dL (ref 0.3–1.2)
Total Protein: 8.1 g/dL (ref 6.5–8.1)

## 2019-07-18 MED ORDER — DRONABINOL 2.5 MG PO CAPS: 2.5000 mg | ORAL_CAPSULE | Freq: Two times a day (BID) | ORAL | 2 refills | Status: DC

## 2019-07-18 MED ORDER — OXYCODONE HCL 10 MG PO TABS: 10.0000 mg | ORAL_TABLET | Freq: Two times a day (BID) | ORAL | 0 refills | Status: DC | PRN

## 2019-07-18 MED ORDER — HEPARIN SOD (PORK) LOCK FLUSH 100 UNIT/ML IV SOLN
500.0000 [IU] | Freq: Once | INTRAVENOUS | Status: AC | PRN
  Administered 2019-07-18: 500 [IU]

## 2019-07-18 MED ORDER — SODIUM CHLORIDE 0.9% FLUSH
10.0000 mL | INTRAVENOUS | Status: DC | PRN
  Administered 2019-07-18 (×2): 10 mL
  Filled 2019-07-18 (×2): qty 10

## 2019-07-18 MED ORDER — SODIUM CHLORIDE 0.9 % IV SOLN
Freq: Once | INTRAVENOUS | Status: AC
  Administered 2019-07-18: 11:00:00 via INTRAVENOUS

## 2019-07-18 MED ORDER — SODIUM CHLORIDE 0.9 % IV SOLN
480.0000 mg | Freq: Once | INTRAVENOUS | Status: AC
  Administered 2019-07-18: 480 mg via INTRAVENOUS
  Filled 2019-07-18: qty 48

## 2019-07-18 NOTE — Progress Notes (Signed)
Pt presents today for f/u visit with Dr. Delton Coombes and treatment. VS within parameters for treatment. Pt has no complaints of pain today. Pt states she has some fatigue relieved by rest. MAR reviewed. Labs pending.   Labs within parameters for treatment.   Proceed with tx message received.   Per RLockamy NP. May hang Opdivo. Port flushes without resistance.   Treatment given today per MD orders. Tolerated infusion without adverse affects. Vital signs stable. No complaints at this time. Discharged from clinic ambulatory. F/U with [facility] as scheduled.

## 2019-07-18 NOTE — Patient Instructions (Signed)
[facility] at [facility] Discharge Instructions  You were seen today by Dr. Katragadda. He went over your recent lab results. He will see you back in 4 weeks for labs and follow up.   Thank you for choosing [facility] at [facility] to provide your oncology and hematology care.  To afford each patient quality time with our provider, please arrive at least 15 minutes before your scheduled appointment time.   If you have a lab appointment with the Cancer Center please come in thru the  Main Entrance and check in at the main information desk  You need to re-schedule your appointment should you arrive 10 or more minutes late.  We strive to give you quality time with our providers, and arriving late affects you and other patients whose appointments are after yours.  Also, if you no show three or more times for appointments you may be dismissed from the clinic at the providers discretion.     Again, thank you for choosing [facility].  Our hope is that these requests will decrease the amount of time that you wait before being seen by our physicians.       _____________________________________________________________  Should you have questions after your visit to  Cancer Center, please contact our office at [phone] between the hours of 8:00 a.m. and 4:30 p.m.  Voicemails left after 4:00 p.m. will not be returned until the following business day.  For prescription refill requests, have your pharmacy contact our office and allow 72 hours.    Cancer Center Support Programs:   > Cancer Support Group  2nd Tuesday of the month 1pm-2pm, Journey Room    

## 2019-07-18 NOTE — Patient Instructions (Signed)
[facility] Discharge Instructions for Patients Receiving Chemotherapy  Today you received the following chemotherapy agents   To help prevent nausea and vomiting after your treatment, we encourage you to take your nausea medication   If you develop nausea and vomiting that is not controlled by your nausea medication, call the clinic.   BELOW ARE SYMPTOMS THAT SHOULD BE REPORTED IMMEDIATELY:  *FEVER GREATER THAN 100.5 F  *CHILLS WITH OR WITHOUT FEVER  NAUSEA AND VOMITING THAT IS NOT CONTROLLED WITH YOUR NAUSEA MEDICATION  *UNUSUAL SHORTNESS OF BREATH  *UNUSUAL BRUISING OR BLEEDING  TENDERNESS IN MOUTH AND THROAT WITH OR WITHOUT PRESENCE OF ULCERS  *URINARY PROBLEMS  *BOWEL PROBLEMS  UNUSUAL RASH Items with * indicate a potential emergency and should be followed up as soon as possible.  Feel free to call the clinic should you have any questions or concerns. The clinic phone number is [phone].  Please show the CHEMO ALERT CARD at check-in to the Emergency Department and triage nurse.   

## 2019-07-18 NOTE — Progress Notes (Signed)
[address], [place] [address]   CLINIC:  Medical Oncology/Hematology  PCP:  [name], MD [place] [address] [phone]   REASON FOR VISIT: Follow-up for metastatic poorly differentiated adenocarcinoma of the lung to the bones and right adrenal  CURRENT THERAPY:Opidvo every 4 weeks  BRIEF ONCOLOGIC HISTORY:  Oncology History  Cancer of upper lobe of right lung ([facility])  07/28/2014 Imaging   CT chest: Large R apical mass consistent with malignancy. This is destroying the R 2nd rib with extension into adjacent soft tissue. R hilar adenopathy with R 5cm adrenal metastatic lesion.   08/01/2014 Initial Biopsy   Lung, needle/core biopsy(ies), right upper lobe - POORLY DIFFERENTIATED ADENOCARCINOMA, SEE COMMENT.   08/08/2014 PET scan   Large hypermetabolic R apical mass with evidence of direct chest wall and mediastinal invasion, right retrocrural lymphadenopathy, extensive retroperitoneal lymphadenopathy, and metastatic lesions to the adrenal glands    09/02/2014 - 11/04/2014 Chemotherapy   Cisplatin/Pemetrexed/Avastin every 21 days x 4 cycles   10/07/2014 - 10/27/2014 Radiation Therapy   Right lung apex for control of brachioplexopathy.   12/24/2014 - 02/25/2015 Chemotherapy   Alimta/Avastin every 21 days.   02/20/2015 Imaging   Increase in size of right adrenal metastasis and subjacent confluent retrocaval lymphadenopathy   02/25/2015 -  Chemotherapy   Nivolumab, zometa   05/04/2015 Imaging   CT CAP- Stable to slight decrease in the posterior right apical lesion. Stable appearance of posterior right upper rib an upper thoracic bony lesions. Slight improvement in right upper lobe tree-in-bud opacity. No new or progressive findings in...   07/28/2015 Imaging   CT CAP- Reduced size of the right apical pleural parenchymal lesion and reduced size of the right adrenal metastatic lesion. Resolution of prior retrocrural adenopathy.   Right eccentric T1 and T2 sclerosis with sclerosis and tapering of the right second..   11/17/2015 Imaging   CT CAP- Stable soft tissue thickening in the apex of the right hemi thorax. Stable right adrenal metastasis. Nodularity along the trachea and mainstem bronchi, relatively new from 07/28/2015, favoring adherent debris.   11/18/2015 Treatment Plan Change   Zometa HELD for upcoming tooth extraction   11/24/2015 Treatment Plan Change   Zometa on hold at this time in preparation for tooth extraction in March 2017.  Zometa las given on 11/18/2015.   02/03/2016 Imaging   CT CAP- Heterogeneous right apical masslike consolidation and right adrenal metastasis are unchanged   04/06/2016 Treatment Plan Change   Zometa restarted 6 weeks out from tooth extraction (04/06/2016)   05/12/2016 Imaging   CT CAP- NED in the chest, abdomen or pelvis.  Some areas of nodularity associated with the mainstem bronchi in the left upper lobe bronchus, favored to represent adherent inspissated secretions   08/17/2016 Imaging   CT CAP- 1. Stable CTs of the chest and abdomen. No evidence of progressive metastatic disease. 2. Probable treated tumor at the right apex, right adrenal gland and T2 vertebral body, stable. 3. Fluctuating nodularity along the walls of the trachea and mainstem bronchi, likely secretions.   12/12/2016 Imaging   Further decrease in size of treated tumor within the right apex. 2. Stable treated tumor involving the right second rib and T2 vertebra. 3. Stable right adrenal gland treated tumor. 4. Emphysema 5. Aortic atherosclerosis   01/04/2017 Imaging   MRI brain- Normal brain MRI.  No intracranial metastatic disease.   03/15/2017 Imaging   CT CAP- 1. No  new or progressive metastatic disease in the chest or abdomen. 2. Stable treated tumor in the apical right upper lobe. Stable treated right posterior second rib and right T2 vertebral lesions. Stable treated right adrenal metastasis. 3.  Aortic atherosclerosis. 4. Moderate emphysema with mild diffuse bronchial wall thickening, suggesting COPD.   06/12/2017 Imaging   CT CAP 1. Similar appearance of treated primary within the right apex. 2. Similar areas of sclerosis within the right second rib, T2, and less so T1 vertebral bodies. These are most consistent with treated metastasis. 3. Similar right adrenal treated metastasis. 4. No evidence of new or progressive disease. 5. Similar right and progressive left areas of bronchial wall thickening and probable mucoid impaction. Correlate with interval infectious symptoms. 6.  Emphysema (ICD10-J43.9). 7. Coronary artery atherosclerosis. Aortic Atherosclerosis (ICD10-I70.0).    10/16/2017 Imaging   CT CAP 1. Stable appearance of the prior Pancoast tumor and related bony findings in the right second rib and right T1 and T2 vertebra compatible with successfully treated tumor. No significant enlargement or new lesions are identified. Similarly the treated right adrenal metastatic lesion is stable in appearance. 2. Upper normal size right hilar lymph node may warrant surveillance. Currently 9 mm in short axis. 3. Other imaging findings of potential clinical significance: Aortic Atherosclerosis (ICD10-I70.0) and Emphysema (ICD10-J43.9). Scattered proximal sigmoid colon diverticula. Mucus plugging medially in the left upper lobe and posteriorly in the right upper lobe.      INTERVAL HISTORY:  Ms. Overacker 63 y.o. female seen for follow-up of metastatic lung cancer.  She reports appetite of 50%.  Energy levels are 75%.  No skin rashes.  No diarrhea or dry cough reported.  She is being very active.  Pain is well controlled with oxycodone as needed.  She does not require them every day.  Denies any nausea, vomiting, constipation, bleeding per rectum or melena.  REVIEW OF SYSTEMS:  Review of Systems  Musculoskeletal:       Occasional leg pains.  All other systems reviewed and  are negative.    PAST MEDICAL/SURGICAL HISTORY:  Past Medical History:  Diagnosis Date  . Adrenal mass, right ([facility]) 07/28/2014  . Anemia   . Bone metastases ([facility]) 04/05/2016  . Cancer ([facility])    lung  right  . Diabetes mellitus without complication ([facility])   . GERD (gastroesophageal reflux disease)   . Hyperlipidemia   . Hypertension   . Hypothyroidism due to medication 01/30/2017  . Lung mass 07/28/2014  . Reflux    Past Surgical History:  Procedure Laterality Date  . AMPUTATION Right 02/22/2017   Procedure: PARTIAL AMPUTATION RIGHT GREAT TOE;  Surgeon: Caprice Beaver, DPM;  Location: [facility] ORS;  Service: Podiatry;  Laterality: Right;  . APPENDECTOMY    . ESOPHAGOGASTRODUODENOSCOPY N/A 12/09/2014   ESP:QZRAQT esophageal stricture/mild-to-noderate erosive gastritis. negative H.pylori  . FLEXIBLE SIGMOIDOSCOPY  2011   Dr. [name]: hyperplastic polyp  . LUNG BIOPSY Right 07/2014   CT guided  . MALONEY DILATION N/A 12/09/2014   Procedure: [name] DILATION;  Surgeon: [name], MD;  Location: [facility] END[facility] SUITE;  Service: Endoscopy;  Laterality: N/A;  . PORTACATH PLACEMENT Left 09/01/2014  . SAVORY DILATION N/A 12/09/2014   Procedure: SAVORY DILATION;  Surgeon: [name], MD;  Location: [facility] END[facility] SU[facility];  Service: Endoscopy;  Laterality: N/A;     SOCIAL HISTORY:  Social History   Socioeconomic History  . Marital status: Married    Spouse name: Not on file  . Number of children: Not  on file  . Years of education: Not on file  . Highest education level: Not on file  Occupational History  . Not on file  Social Needs  . Financial resource strain: Not on file  . Food insecurity    Worry: Not on file    Inability: Not on file  . Transportation needs    Medical: Not on file    Non-medical: Not on file  Tobacco Use  . Smoking status: Light Tobacco Smoker    Packs/day: 0.30    Years: 20.00    Pack years: 6.00    Types: Cigarettes  . Smokeless tobacco: Never Used  Substance and  Sexual Activity  . Alcohol use: No  . Drug use: No  . Sexual activity: Not on file  Lifestyle  . Physical activity    Days per week: Not on file    Minutes per session: Not on file  . Stress: Not on file  Relationships  . Social Herbalist on phone: Not on file    Gets together: Not on file    Attends religious service: Not on file    Active member of club or organization: Not on file    Attends meetings of clubs or organizations: Not on file    Relationship status: Not on file  . Intimate partner violence    Fear of current or ex partner: Not on file    Emotionally abused: Not on file    Physically abused: Not on file    Forced sexual activity: Not on file  Other Topics Concern  . Not on file  Social History Narrative  . Not on file    FAMILY HISTORY:  Family History  Problem Relation Age of Onset  . Cancer Sister   . Colon cancer Neg Hx     CURRENT MEDICATIONS:  Outpatient Encounter Medications as of 07/18/2019  Medication Sig Note  . Calcium Carb-Cholecalciferol (CALCIUM 1000 + D PO) Take 1 tablet by mouth daily.   . chlorhexidine (PERIDEX) 0.12 % solution Use as directed 5 mLs in the mouth or throat as needed.    . ENSURE (ENSURE) Take 1 Can by mouth 4 (four) times daily.   . Fluticasone-Salmeterol (ADVAIR DISKUS) 500-50 MCG/DOSE AEPB Inhale 1 puff into the lungs 2 (two) times daily. (Patient taking differently: Inhale 1 puff into the lungs 2 (two) times daily as needed (for respiratory issues.). )   . ibuprofen (ADVIL,MOTRIN) 200 MG tablet Take 400 mg by mouth every 8 (eight) hours as needed (for pain.).   Marland Kitchen levocetirizine (XYZAL) 5 MG tablet TAKE 1 TABLET BY MOUTH AT BEDTIME FOR ALLERGIES COUGH   . levothyroxine (SYNTHROID) 25 MCG tablet TAKE 1/2 (ONE-HALF) TABLET BY MOUTH ONCE DAILY BEFORE BREAKFAST   . lidocaine-prilocaine (EMLA) cream APPLY A QUARTER SIZE AMOUNT TO PORT SITE 1 HOUR PRIOR TO CHEMO.  DO NOT RUB IN.  COVER WITH PLASTIC WRAP   . magic  mouthwash SOLN Take 5 mLs by mouth 4 (four) times daily as needed for mouth pain.   . Nivolumab (OPDIVO IV) Inject into the vein every 28 (twenty-eight) days.   Marland Kitchen omeprazole (PRILOSEC) 40 MG capsule Take 1 capsule (40 mg total) by mouth daily. TAKE 30 MINS PRIOR TO YOUR FIRST MEAL.   Marland Kitchen ondansetron (ZOFRAN) 8 MG tablet Take 1 tablet (8 mg total) by mouth every 8 (eight) hours as needed for nausea or vomiting.   . simvastatin (ZOCOR) 40 MG tablet TAKE ONE TABLET  BY MOUTH ONCE DAILY IN THE MORNING   . [DISCONTINUED] Oxycodone HCl 10 MG TABS Take 1-2 tablets (10-20 mg total) by mouth every 12 (twelve) hours as needed (pain.).   Marland Kitchen denosumab (XGEVA) 120 MG/1.7ML SOLN injection Inject 120 mg into the skin every 28 (twenty-eight) days. 11/08/2018: Hold per MD until dental work is complete.   . [DISCONTINUED] dronabinol (MARINOL) 2.5 MG capsule Take 1 capsule (2.5 mg total) by mouth 2 (two) times daily before lunch and supper. (Patient not taking: Reported on 07/18/2019)    Facility-Administered Encounter Medications as of 07/18/2019  Medication  . sodium chloride flush (NS) 0.9 % injection 10 mL    ALLERGIES:  Allergies  Allergen Reactions  . Xgeva [Denosumab]     osteonecrosis     PHYSICAL EXAM:  ECOG Performance status: 1  Vitals:   07/18/19 0919  BP: 118/79  Pulse: 91  Resp: 18  Temp: (!) 97.3 F (36.3 C)  SpO2: 92%   Filed Weights   07/18/19 0919  Weight: 98 lb 12.8 oz (44.8 kg)    Physical Exam Constitutional:      Appearance: Normal appearance. She is normal weight.  Cardiovascular:     Rate and Rhythm: Normal rate and regular rhythm.     Heart sounds: Normal heart sounds.  Pulmonary:     Effort: Pulmonary effort is normal.     Breath sounds: Normal breath sounds.  Abdominal:     General: There is no distension.     Palpations: Abdomen is soft. There is no mass.  Musculoskeletal: Normal range of motion.  Skin:    General: Skin is warm and dry.  Neurological:      Mental Status: She is alert and oriented to person, place, and time. Mental status is at baseline.  Psychiatric:        Mood and Affect: Mood normal.        Behavior: Behavior normal.      LABORATORY DATA:  I have reviewed the labs as listed.  CBC    Component Value Date/Time   WBC 8.2 07/18/2019 0844   RBC 4.55 07/18/2019 0844   HGB 14.0 07/18/2019 0844   HCT 43.7 07/18/2019 0844   PLT 271 07/18/2019 0844   MCV 96.0 07/18/2019 0844   MCH 30.8 07/18/2019 0844   MCHC 32.0 07/18/2019 0844   RDW 14.8 07/18/2019 0844   LYMPHSABS 2.7 07/18/2019 0844   MONOABS 0.7 07/18/2019 0844   EOSABS 0.2 07/18/2019 0844   BASOSABS 0.0 07/18/2019 0844   CMP Latest Ref Rng & Units 07/18/2019 06/20/2019 05/23/2019  Glucose 70 - 99 mg/dL 68(L) 105(H) 96  BUN 8 - 23 mg/dL 13 14 13   Creatinine 0.44 - 1.00 mg/dL 1.25(H) 1.25(H) 1.33(H)  Sodium 135 - 145 mmol/L 141 138 141  Potassium 3.5 - 5.1 mmol/L 4.1 3.7 3.4(L)  Chloride 98 - 111 mmol/L 105 105 105  CO2 22 - 32 mmol/L 26 25 24   Calcium 8.9 - 10.3 mg/dL 10.3 9.7 9.4  Total Protein 6.5 - 8.1 g/dL 8.1 8.1 7.7  Total Bilirubin 0.3 - 1.2 mg/dL 0.5 0.5 0.7  Alkaline Phos 38 - 126 U/L 61 65 73  AST 15 - 41 U/L 19 19 21   ALT 0 - 44 U/L 15 14 18        DIAGNOSTIC IMAGING:  I have independently reviewed the scans and discussed with the patient.     ASSESSMENT & PLAN:   Cancer of upper lobe of right lung 1.  Metastatic adenocarcinoma of the lung to the bones and right adrenal: -Foundation 1 testing shows 14 genomic alterations, no approved FDA therapies. -Opdivo 400 mg monthly started on 02/25/2015. -He is continuing to tolerate immunotherapy very well.  Did not develop any immunotherapy related side effects. - CT CAP on 06/17/2019 showed stable exam with similar-appearing sclerotic lesion in the T2 vertebral body and partially calcified right adrenal nodule.  No new findings to suggest metastatic disease.  New 4 mm right upper lobe lung nodule may  be infectious/inflammatory in etiology. - We reviewed her blood work.  She denies any cough or diarrhea.  No skin rashes reported.  She will proceed with her Opdivo today. -We will see her back in 4 weeks for follow-up.  2.  Back pain and leg pain: -She takes oxycodone 1 tablet every other day as needed.  3.  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 -She had extensive surgery.  We have discontinued denosumab.     Total time spent is 25 minutes with more than 50% of the time spent face-to-face discussing diagnosis treatment plan and coordination of care. Orders placed this encounter:  No orders of the defined types were placed in this encounter.     Derek Jack, MD [place] [phone]

## 2019-07-27 ENCOUNTER — Encounter (HOSPITAL_COMMUNITY): Payer: Self-pay | Admitting: Hematology

## 2019-07-27 NOTE — Assessment & Plan Note (Signed)
1.  Metastatic adenocarcinoma of the lung to the bones and right adrenal: -Foundation 1 testing shows 14 genomic alterations, no approved FDA therapies. -Opdivo 400 mg monthly started on 02/25/2015. -He is continuing to tolerate immunotherapy very well.  Did not develop any immunotherapy related side effects. - CT CAP on 06/17/2019 showed stable exam with similar-appearing sclerotic lesion in the T2 vertebral body and partially calcified right adrenal nodule.  No new findings to suggest metastatic disease.  New 4 mm right upper lobe lung nodule may be infectious/inflammatory in etiology. - We reviewed her blood work.  She denies any cough or diarrhea.  No skin rashes reported.  She will proceed with her Opdivo today. -We will see her back in 4 weeks for follow-up.  2.  Back pain and leg pain: -She takes oxycodone 1 tablet every other day as needed.  3.  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 -She had extensive surgery.  We have discontinued denosumab.

## 2019-08-05 ENCOUNTER — Other Ambulatory Visit (HOSPITAL_COMMUNITY): Payer: Self-pay | Admitting: Nurse Practitioner

## 2019-08-05 DIAGNOSIS — E032 Hypothyroidism due to medicaments and other exogenous substances: Secondary | ICD-10-CM

## 2019-08-15 ENCOUNTER — Encounter (HOSPITAL_COMMUNITY): Payer: Self-pay | Admitting: Hematology

## 2019-08-15 ENCOUNTER — Inpatient Hospital Stay (HOSPITAL_BASED_OUTPATIENT_CLINIC_OR_DEPARTMENT_OTHER): Payer: Medicare HMO | Admitting: Hematology

## 2019-08-15 ENCOUNTER — Inpatient Hospital Stay (HOSPITAL_COMMUNITY): Payer: Medicare HMO | Attending: Hematology

## 2019-08-15 ENCOUNTER — Other Ambulatory Visit: Payer: Self-pay

## 2019-08-15 ENCOUNTER — Inpatient Hospital Stay (HOSPITAL_COMMUNITY): Payer: Medicare HMO

## 2019-08-15 VITALS — BP 113/74 | HR 79 | Resp 16

## 2019-08-15 DIAGNOSIS — C3411 Malignant neoplasm of upper lobe, right bronchus or lung: Secondary | ICD-10-CM

## 2019-08-15 DIAGNOSIS — Z5112 Encounter for antineoplastic immunotherapy: Secondary | ICD-10-CM | POA: Diagnosis present

## 2019-08-15 DIAGNOSIS — Z9221 Personal history of antineoplastic chemotherapy: Secondary | ICD-10-CM | POA: Insufficient documentation

## 2019-08-15 DIAGNOSIS — I10 Essential (primary) hypertension: Secondary | ICD-10-CM | POA: Insufficient documentation

## 2019-08-15 DIAGNOSIS — M79606 Pain in leg, unspecified: Secondary | ICD-10-CM | POA: Insufficient documentation

## 2019-08-15 DIAGNOSIS — Z79899 Other long term (current) drug therapy: Secondary | ICD-10-CM | POA: Diagnosis not present

## 2019-08-15 DIAGNOSIS — F17210 Nicotine dependence, cigarettes, uncomplicated: Secondary | ICD-10-CM | POA: Diagnosis not present

## 2019-08-15 DIAGNOSIS — Z923 Personal history of irradiation: Secondary | ICD-10-CM | POA: Insufficient documentation

## 2019-08-15 DIAGNOSIS — E119 Type 2 diabetes mellitus without complications: Secondary | ICD-10-CM | POA: Diagnosis not present

## 2019-08-15 DIAGNOSIS — E032 Hypothyroidism due to medicaments and other exogenous substances: Secondary | ICD-10-CM | POA: Insufficient documentation

## 2019-08-15 DIAGNOSIS — C7971 Secondary malignant neoplasm of right adrenal gland: Secondary | ICD-10-CM | POA: Insufficient documentation

## 2019-08-15 DIAGNOSIS — C7951 Secondary malignant neoplasm of bone: Secondary | ICD-10-CM | POA: Diagnosis not present

## 2019-08-15 DIAGNOSIS — E278 Other specified disorders of adrenal gland: Secondary | ICD-10-CM

## 2019-08-15 DIAGNOSIS — R69 Illness, unspecified: Secondary | ICD-10-CM | POA: Diagnosis not present

## 2019-08-15 DIAGNOSIS — M549 Dorsalgia, unspecified: Secondary | ICD-10-CM | POA: Insufficient documentation

## 2019-08-15 LAB — CBC WITH DIFFERENTIAL/PLATELET
Abs Immature Granulocytes: 0.04 10*3/uL (ref 0.00–0.07)
Basophils Absolute: 0 10*3/uL (ref 0.0–0.1)
Basophils Relative: 0 %
Eosinophils Absolute: 0.2 10*3/uL (ref 0.0–0.5)
Eosinophils Relative: 2 %
HCT: 44.7 % (ref 36.0–46.0)
Hemoglobin: 14.1 g/dL (ref 12.0–15.0)
Immature Granulocytes: 1 %
Lymphocytes Relative: 33 %
Lymphs Abs: 2.7 10*3/uL (ref 0.7–4.0)
MCH: 30.3 pg (ref 26.0–34.0)
MCHC: 31.5 g/dL (ref 30.0–36.0)
MCV: 96.1 fL (ref 80.0–100.0)
Monocytes Absolute: 0.5 10*3/uL (ref 0.1–1.0)
Monocytes Relative: 6 %
Neutro Abs: 4.8 10*3/uL (ref 1.7–7.7)
Neutrophils Relative %: 58 %
Platelets: 270 10*3/uL (ref 150–400)
RBC: 4.65 MIL/uL (ref 3.87–5.11)
RDW: 14.9 % (ref 11.5–15.5)
WBC: 8.2 10*3/uL (ref 4.0–10.5)
nRBC: 0 % (ref 0.0–0.2)

## 2019-08-15 LAB — COMPREHENSIVE METABOLIC PANEL
ALT: 25 U/L (ref 0–44)
AST: 26 U/L (ref 15–41)
Albumin: 4 g/dL (ref 3.5–5.0)
Alkaline Phosphatase: 63 U/L (ref 38–126)
Anion gap: 7 (ref 5–15)
BUN: 14 mg/dL (ref 8–23)
CO2: 24 mmol/L (ref 22–32)
Calcium: 9.7 mg/dL (ref 8.9–10.3)
Chloride: 104 mmol/L (ref 98–111)
Creatinine, Ser: 1.29 mg/dL — ABNORMAL HIGH (ref 0.44–1.00)
GFR calc Af Amer: 51 mL/min — ABNORMAL LOW (ref >60)
GFR calc non Af Amer: 44 mL/min — ABNORMAL LOW (ref >60)
Glucose, Bld: 116 mg/dL — ABNORMAL HIGH (ref 70–99)
Potassium: 3.8 mmol/L (ref 3.5–5.1)
Sodium: 135 mmol/L (ref 135–145)
Total Bilirubin: 0.3 mg/dL (ref 0.3–1.2)
Total Protein: 8 g/dL (ref 6.5–8.1)

## 2019-08-15 LAB — LACTATE DEHYDROGENASE: LDH: 115 U/L (ref 98–192)

## 2019-08-15 LAB — TSH: TSH: 2.268 u[IU]/mL (ref 0.350–4.500)

## 2019-08-15 LAB — MAGNESIUM: Magnesium: 2 mg/dL (ref 1.7–2.4)

## 2019-08-15 MED ORDER — SODIUM CHLORIDE 0.9 % IV SOLN
480.0000 mg | Freq: Once | INTRAVENOUS | Status: AC
  Administered 2019-08-15: 480 mg via INTRAVENOUS
  Filled 2019-08-15: qty 48

## 2019-08-15 MED ORDER — SODIUM CHLORIDE 0.9% FLUSH
10.0000 mL | INTRAVENOUS | Status: DC | PRN
  Administered 2019-08-15: 10 mL
  Filled 2019-08-15: qty 10

## 2019-08-15 MED ORDER — SODIUM CHLORIDE 0.9 % IV SOLN
Freq: Once | INTRAVENOUS | Status: AC
  Administered 2019-08-15: 10:00:00 via INTRAVENOUS

## 2019-08-15 MED ORDER — HEPARIN SOD (PORK) LOCK FLUSH 100 UNIT/ML IV SOLN
500.0000 [IU] | Freq: Once | INTRAVENOUS | Status: AC | PRN
  Administered 2019-08-15: 500 [IU]

## 2019-08-15 NOTE — Progress Notes (Signed)
1015- Patient seen by Dr. Delton Coombes, lab results and vitals reviewed. Per MD, ok to proceed with treatment today. Mag level added on per MD order and lab made aware.  Melissa Sullivan tolerated treatment without incident or complaint. VSS. Discharged in satisfactory condition with follow up instructions.

## 2019-08-15 NOTE — Patient Instructions (Signed)
[facility] Discharge Instructions for Patients Receiving Chemotherapy   Beginning January 23rd 2017 lab work for the [facility] will be done in the  Main lab at [facility] on 1st floor. If you have a lab appointment with the [facility] please come in thru the  Main Entrance and check in at the main information desk   Today you received the following chemotherapy agents Opdivo  To help prevent nausea and vomiting after your treatment, we encourage you to take your nausea medication    If you develop nausea and vomiting, or diarrhea that is not controlled by your medication, call the clinic.  The clinic phone number is [phone]. Office hours are Monday-Friday 8:30am-5:00pm.  BELOW ARE SYMPTOMS THAT SHOULD BE REPORTED IMMEDIATELY:  *FEVER GREATER THAN 101.0 F  *CHILLS WITH OR WITHOUT FEVER  NAUSEA AND VOMITING THAT IS NOT CONTROLLED WITH YOUR NAUSEA MEDICATION  *UNUSUAL SHORTNESS OF BREATH  *UNUSUAL BRUISING OR BLEEDING  TENDERNESS IN MOUTH AND THROAT WITH OR WITHOUT PRESENCE OF ULCERS  *URINARY PROBLEMS  *BOWEL PROBLEMS  UNUSUAL RASH Items with * indicate a potential emergency and should be followed up as soon as possible. If you have an emergency after office hours please contact your primary care physician or go to the nearest emergency department.  Please call the clinic during office hours if you have any questions or concerns.   You may also contact the Patient Navigator at [phone] should you have any questions or need assistance in obtaining follow up care.      Resources For Cancer Patients and their Caregivers ? American Cancer Society: Can assist with transportation, wigs, general needs, runs Look Good Feel Better.        440-599-7620 ? Cancer Care: Provides financial assistance, online support groups, medication/co-pay assistance.  1-800-813-HOPE 970-787-9621) ? [employer] Assists [place] cancer  patients and their families through emotional , educational and financial support.  (414)888-4718 ? [place] DSS Where to apply for food stamps, Medicaid and utility assistance. (857) 319-0659 ? [facility]: Transportation to medical appointments. 201-765-3974 ? Social Security Administration: May apply for disability if have a Stage IV cancer. 8033346423 (415)150-8141 ? [employer], Disability and Transit Services: Assists with nutrition, care and transit needs. 6261042600

## 2019-08-15 NOTE — Progress Notes (Signed)
Melissa Sullivan, [place] [address]   CLINIC:  Medical Oncology/Hematology  PCP:  [name], MD [place] [address] [phone]   REASON FOR VISIT: Follow-up for metastatic poorly differentiated adenocarcinoma of the lung to the bones and right adrenal  CURRENT THERAPY:Opidvo every 4 weeks  BRIEF ONCOLOGIC HISTORY:  Oncology History  Cancer of upper lobe of right lung ([place])  07/28/2014 Imaging   CT chest: Large R apical mass consistent with malignancy. This is destroying the R 2nd rib with extension into adjacent soft tissue. R hilar adenopathy with R 5cm adrenal metastatic lesion.   08/01/2014 Initial Biopsy   Lung, needle/core biopsy(ies), right upper lobe - POORLY DIFFERENTIATED ADENOCARCINOMA, SEE COMMENT.   08/08/2014 PET scan   Large hypermetabolic R apical mass with evidence of direct chest wall and mediastinal invasion, right retrocrural lymphadenopathy, extensive retroperitoneal lymphadenopathy, and metastatic lesions to the adrenal glands    09/02/2014 - 11/04/2014 Chemotherapy   Cisplatin/Pemetrexed/Avastin every 21 days x 4 cycles   10/07/2014 - 10/27/2014 Radiation Therapy   Right lung apex for control of brachioplexopathy.   12/24/2014 - 02/25/2015 Chemotherapy   Alimta/Avastin every 21 days.   02/20/2015 Imaging   Increase in size of right adrenal metastasis and subjacent confluent retrocaval lymphadenopathy   02/25/2015 -  Chemotherapy   Nivolumab, zometa   05/04/2015 Imaging   CT CAP- Stable to slight decrease in the posterior right apical lesion. Stable appearance of posterior right upper rib an upper thoracic bony lesions. Slight improvement in right upper lobe tree-in-bud opacity. No new or progressive findings in...   07/28/2015 Imaging   CT CAP- Reduced size of the right apical pleural parenchymal lesion and reduced size of the right adrenal metastatic lesion. Resolution of prior retrocrural adenopathy.   Right eccentric T1 and T2 sclerosis with sclerosis and tapering of the right second..   11/17/2015 Imaging   CT CAP- Stable soft tissue thickening in the apex of the right hemi thorax. Stable right adrenal metastasis. Nodularity along the trachea and mainstem bronchi, relatively new from 07/28/2015, favoring adherent debris.   11/18/2015 Treatment Plan Change   Zometa HELD for upcoming tooth extraction   11/24/2015 Treatment Plan Change   Zometa on hold at this time in preparation for tooth extraction in March 2017.  Zometa las given on 11/18/2015.   02/03/2016 Imaging   CT CAP- Heterogeneous right apical masslike consolidation and right adrenal metastasis are unchanged   04/06/2016 Treatment Plan Change   Zometa restarted 6 weeks out from tooth extraction (04/06/2016)   05/12/2016 Imaging   CT CAP- NED in the chest, abdomen or pelvis.  Some areas of nodularity associated with the mainstem bronchi in the left upper lobe bronchus, favored to represent adherent inspissated secretions   08/17/2016 Imaging   CT CAP- 1. Stable CTs of the chest and abdomen. No evidence of progressive metastatic disease. 2. Probable treated tumor at the right apex, right adrenal gland and T2 vertebral body, stable. 3. Fluctuating nodularity along the walls of the trachea and mainstem bronchi, likely secretions.   12/12/2016 Imaging   Further decrease in size of treated tumor within the right apex. 2. Stable treated tumor involving the right second rib and T2 vertebra. 3. Stable right adrenal gland treated tumor. 4. Emphysema 5. Aortic atherosclerosis   01/04/2017 Imaging   MRI brain- Normal brain MRI.  No intracranial metastatic disease.   03/15/2017 Imaging   CT CAP- 1. No  new or progressive metastatic disease in the chest or abdomen. 2. Stable treated tumor in the apical right upper lobe. Stable treated right posterior second rib and right T2 vertebral lesions. Stable treated right adrenal metastasis. 3.  Aortic atherosclerosis. 4. Moderate emphysema with mild diffuse bronchial wall thickening, suggesting COPD.   06/12/2017 Imaging   CT CAP 1. Similar appearance of treated primary within the right apex. 2. Similar areas of sclerosis within the right second rib, T2, and less so T1 vertebral bodies. These are most consistent with treated metastasis. 3. Similar right adrenal treated metastasis. 4. No evidence of new or progressive disease. 5. Similar right and progressive left areas of bronchial wall thickening and probable mucoid impaction. Correlate with interval infectious symptoms. 6.  Emphysema (ICD10-J43.9). 7. Coronary artery atherosclerosis. Aortic Atherosclerosis (ICD10-I70.0).    10/16/2017 Imaging   CT CAP 1. Stable appearance of the prior Pancoast tumor and related bony findings in the right second rib and right T1 and T2 vertebra compatible with successfully treated tumor. No significant enlargement or new lesions are identified. Similarly the treated right adrenal metastatic lesion is stable in appearance. 2. Upper normal size right hilar lymph node may warrant surveillance. Currently 9 mm in short axis. 3. Other imaging findings of potential clinical significance: Aortic Atherosclerosis (ICD10-I70.0) and Emphysema (ICD10-J43.9). Scattered proximal sigmoid colon diverticula. Mucus plugging medially in the left upper lobe and posteriorly in the right upper lobe.      INTERVAL HISTORY:  Melissa Sullivan 63 y.o. female seen for follow-up of metastatic lung cancer.  Appetite and energy levels are 75%.  She was reportedly told by her dentist that there are some changes of osteonecrosis in the left lower jaw.  She is also taking Marinol and requests refill.  Pain in the lower back and leg pains are well controlled.  She had reported some new onset pain in the right forearm and hand, which is improving in the last 2 weeks.  REVIEW OF SYSTEMS:  Review of Systems   Musculoskeletal:       Occasional leg pains.  All other systems reviewed and are negative.    PAST MEDICAL/SURGICAL HISTORY:  Past Medical History:  Diagnosis Date  . Adrenal mass, right ([place]) 07/28/2014  . Anemia   . Bone metastases ([place]) 04/05/2016  . Cancer ([place])    lung  right  . Diabetes mellitus without complication ([place])   . GERD (gastroesophageal reflux disease)   . Hyperlipidemia   . Hypertension   . Hypothyroidism due to medication 01/30/2017  . Lung mass 07/28/2014  . Reflux    Past Surgical History:  Procedure Laterality Date  . AMPUTATION Right 02/22/2017   Procedure: PARTIAL AMPUTATION RIGHT GREAT TOE;  Surgeon: Caprice Beaver, DPM;  Location: [facility] ORS;  Service: Podiatry;  Laterality: Right;  . APPENDECTOMY    . ESOPHAGOGASTRODUODENOSCOPY N/A 12/09/2014   KXF:GHWEXH esophageal stricture/mild-to-noderate erosive gastritis. negative H.pylori  . FLEXIBLE SIGMOIDOSCOPY  2011   Dr. [name]: hyperplastic polyp  . LUNG BIOPSY Right 07/2014   CT guided  . MALONEY DILATION N/A 12/09/2014   Procedure: [name] DILATION;  Surgeon: [name], MD;  Location: [facility];  Service: Endoscopy;  Laterality: N/A;  . PORTACATH PLACEMENT Left 09/01/2014  . SAVORY DILATION N/A 12/09/2014   Procedure: SAVORY DILATION;  Surgeon: [name], MD;  Location: [facility];  Service: Endoscopy;  Laterality: N/A;     SOCIAL HISTORY:  Social History   Socioeconomic History  . Marital status: Married  Spouse name: Not on file  . Number of children: Not on file  . Years of education: Not on file  . Highest education level: Not on file  Occupational History  . Not on file  Social Needs  . Financial resource strain: Not on file  . Food insecurity    Worry: Not on file    Inability: Not on file  . Transportation needs    Medical: Not on file    Non-medical: Not on file  Tobacco Use  . Smoking status: Light Tobacco Smoker    Packs/day: 0.30    Years: 20.00    Pack years:  6.00    Types: Cigarettes  . Smokeless tobacco: Never Used  Substance and Sexual Activity  . Alcohol use: No  . Drug use: No  . Sexual activity: Not on file  Lifestyle  . Physical activity    Days per week: Not on file    Minutes per session: Not on file  . Stress: Not on file  Relationships  . Social Herbalist on phone: Not on file    Gets together: Not on file    Attends religious service: Not on file    Active member of club or organization: Not on file    Attends meetings of clubs or organizations: Not on file    Relationship status: Not on file  . Intimate partner violence    Fear of current or ex partner: Not on file    Emotionally abused: Not on file    Physically abused: Not on file    Forced sexual activity: Not on file  Other Topics Concern  . Not on file  Social History Narrative  . Not on file    FAMILY HISTORY:  Family History  Problem Relation Age of Onset  . Cancer Sister   . Colon cancer Neg Hx     CURRENT MEDICATIONS:  Outpatient Encounter Medications as of 08/15/2019  Medication Sig Note  . Calcium Carb-Cholecalciferol (CALCIUM 1000 + D PO) Take 1 tablet by mouth daily.   . chlorhexidine (PERIDEX) 0.12 % solution Use as directed 5 mLs in the mouth or throat as needed.    . dronabinol (MARINOL) 2.5 MG capsule Take 1 capsule (2.5 mg total) by mouth 2 (two) times daily before lunch and supper.   Marland Kitchen ENSURE (ENSURE) Take 1 Can by mouth 4 (four) times daily.   . Fluticasone-Salmeterol (ADVAIR DISKUS) 500-50 MCG/DOSE AEPB Inhale 1 puff into the lungs 2 (two) times daily. (Patient taking differently: Inhale 1 puff into the lungs 2 (two) times daily as needed (for respiratory issues.). )   . ibuprofen (ADVIL,MOTRIN) 200 MG tablet Take 400 mg by mouth every 8 (eight) hours as needed (for pain.).   Marland Kitchen levocetirizine (XYZAL) 5 MG tablet TAKE 1 TABLET BY MOUTH AT BEDTIME FOR ALLERGIES COUGH   . levothyroxine (SYNTHROID) 25 MCG tablet TAKE 1/2 (ONE-HALF)  TABLET BY MOUTH ONCE DAILY BEFORE BREAKFAST   . lidocaine-prilocaine (EMLA) cream APPLY A QUARTER SIZE AMOUNT TO PORT SITE 1 HOUR PRIOR TO CHEMO.  DO NOT RUB IN.  COVER WITH PLASTIC WRAP   . magic mouthwash SOLN Take 5 mLs by mouth 4 (four) times daily as needed for mouth pain.   . Nivolumab (OPDIVO IV) Inject into the vein every 28 (twenty-eight) days.   Marland Kitchen omeprazole (PRILOSEC) 40 MG capsule Take 1 capsule (40 mg total) by mouth daily. TAKE 30 MINS PRIOR TO YOUR FIRST MEAL.   Marland Kitchen  ondansetron (ZOFRAN) 8 MG tablet Take 1 tablet (8 mg total) by mouth every 8 (eight) hours as needed for nausea or vomiting.   . Oxycodone HCl 10 MG TABS Take 1-2 tablets (10-20 mg total) by mouth every 12 (twelve) hours as needed (pain.).   Marland Kitchen simvastatin (ZOCOR) 40 MG tablet TAKE ONE TABLET BY MOUTH ONCE DAILY IN THE MORNING   . [DISCONTINUED] denosumab (XGEVA) 120 MG/1.7ML SOLN injection Inject 120 mg into the skin every 28 (twenty-eight) days. 11/08/2018: Hold per MD until dental work is complete.    Facility-Administered Encounter Medications as of 08/15/2019  Medication  . sodium chloride flush (NS) 0.9 % injection 10 mL    ALLERGIES:  Allergies  Allergen Reactions  . Xgeva [Denosumab]     osteonecrosis     PHYSICAL EXAM:  ECOG Performance status: 1  Vitals:   08/15/19 0947  BP: 121/74  Pulse: 98  Resp: 16  Temp: 97.9 F (36.6 C)  SpO2: 100%   Filed Weights   08/15/19 0947  Weight: 100 lb 12.8 oz (45.7 kg)    Physical Exam Constitutional:      Appearance: Normal appearance. She is normal weight.  Cardiovascular:     Rate and Rhythm: Normal rate and regular rhythm.     Heart sounds: Normal heart sounds.  Pulmonary:     Effort: Pulmonary effort is normal.     Breath sounds: Normal breath sounds.  Abdominal:     General: There is no distension.     Palpations: Abdomen is soft. There is no mass.  Musculoskeletal: Normal range of motion.  Skin:    General: Skin is warm and dry.   Neurological:     Mental Status: She is alert and oriented to person, place, and time. Mental status is at baseline.  Psychiatric:        Mood and Affect: Mood normal.        Behavior: Behavior normal.      LABORATORY DATA:  I have reviewed the labs as listed.  CBC    Component Value Date/Time   WBC 8.2 08/15/2019 0904   RBC 4.65 08/15/2019 0904   HGB 14.1 08/15/2019 0904   HCT 44.7 08/15/2019 0904   PLT 270 08/15/2019 0904   MCV 96.1 08/15/2019 0904   MCH 30.3 08/15/2019 0904   MCHC 31.5 08/15/2019 0904   RDW 14.9 08/15/2019 0904   LYMPHSABS 2.7 08/15/2019 0904   MONOABS 0.5 08/15/2019 0904   EOSABS 0.2 08/15/2019 0904   BASOSABS 0.0 08/15/2019 0904   CMP Latest Ref Rng & Units 08/15/2019 07/18/2019 06/20/2019  Glucose 70 - 99 mg/dL 116(H) 68(L) 105(H)  BUN 8 - 23 mg/dL 14 13 14   Creatinine 0.44 - 1.00 mg/dL 1.29(H) 1.25(H) 1.25(H)  Sodium 135 - 145 mmol/L 135 141 138  Potassium 3.5 - 5.1 mmol/L 3.8 4.1 3.7  Chloride 98 - 111 mmol/L 104 105 105  CO2 22 - 32 mmol/L 24 26 25   Calcium 8.9 - 10.3 mg/dL 9.7 10.3 9.7  Total Protein 6.5 - 8.1 g/dL 8.0 8.1 8.1  Total Bilirubin 0.3 - 1.2 mg/dL 0.3 0.5 0.5  Alkaline Phos 38 - 126 U/L 63 61 65  AST 15 - 41 U/L 26 19 19   ALT 0 - 44 U/L 25 15 14        DIAGNOSTIC IMAGING:  I have independently reviewed the scans and discussed with the patient.     ASSESSMENT & PLAN:   Cancer of upper lobe of right  lung 1.  Metastatic adenocarcinoma of the lung to the bones and right adrenal: -Foundation 1 testing shows 14 genomic alterations, no approved FDA therapies. -Opdivo 400 mg monthly started on 02/25/2015.  She is continuing to tolerate immunotherapy without any side effects. -CT CAP on 06/17/2019 showed stable exam with similar-appearing sclerotic lesion in the T2 vertebral body and partially calcified right adrenal nodule.  No new findings to suggest metastatic disease.  New 4 mm right upper lobe lung nodule may be  infectious/inflammatory in etiology. - We reviewed her blood work.  She will proceed with her Opdivo today. -She will come back in 4 weeks for follow-up. - Her dentist reportedly saw some osteonecrosis changes in the left lower jaw.  Previously she had osteonecrosis in the upper jaw.  We will make a referral to Dr. Luvenia Heller.  2.  Back pain and leg pain: -She takes oxycodone 1 tablet every other day as needed.  3.  Appetite: -She is taking Marinol.  We will send a refill.  4.  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 -She had extensive surgery and denosumab was discontinued.     Total time spent is 25 minutes with more than 50% of the time spent face-to-face discussing treatment plan, counseling and coordination of care. Orders placed this encounter:  No orders of the defined types were placed in this encounter.     Derek Jack, MD [place] [phone]

## 2019-08-15 NOTE — Patient Instructions (Addendum)
[place] at [facility] Discharge Instructions  You were seen today by Dr. Delton Coombes. He went over your recent lab results. He will see you back in 4 weeks for labs, treatment and follow up.   Thank you for choosing [place] at [facility] to provide your oncology and hematology care.  To afford each patient quality time with our provider, please arrive at least 15 minutes before your scheduled appointment time.   If you have a lab appointment with the [facility] please come in thru the  Main Entrance and check in at the main information desk  You need to re-schedule your appointment should you arrive 10 or more minutes late.  We strive to give you quality time with our providers, and arriving late affects you and other patients whose appointments are after yours.  Also, if you no show three or more times for appointments you may be dismissed from the clinic at the providers discretion.     Again, thank you for choosing [facility].  Our hope is that these requests will decrease the amount of time that you wait before being seen by our physicians.       _____________________________________________________________  Should you have questions after your visit to [facility], please contact our office at [phone] between the hours of 8:00 a.m. and 4:30 p.m.  Voicemails left after 4:00 p.m. will not be returned until the following business day.  For prescription refill requests, have your pharmacy contact our office and allow 72 hours.    Cancer Center Support Programs:   > Cancer Support Group  2nd Tuesday of the month 1pm-2pm, Journey Room

## 2019-08-18 ENCOUNTER — Encounter (HOSPITAL_COMMUNITY): Payer: Self-pay | Admitting: Hematology

## 2019-08-18 NOTE — Assessment & Plan Note (Signed)
1.  Metastatic adenocarcinoma of the lung to the bones and right adrenal: -Foundation 1 testing shows 14 genomic alterations, no approved FDA therapies. -Opdivo 400 mg monthly started on 02/25/2015.  She is continuing to tolerate immunotherapy without any side effects. -CT CAP on 06/17/2019 showed stable exam with similar-appearing sclerotic lesion in the T2 vertebral body and partially calcified right adrenal nodule.  No new findings to suggest metastatic disease.  New 4 mm right upper lobe lung nodule may be infectious/inflammatory in etiology. - We reviewed her blood work.  She will proceed with her Opdivo today. -She will come back in 4 weeks for follow-up. - Her dentist reportedly saw some osteonecrosis changes in the left lower jaw.  Previously she had osteonecrosis in the upper jaw.  We will make a referral to Dr. Luvenia Heller.  2.  Back pain and leg pain: -She takes oxycodone 1 tablet every other day as needed.  3.  Appetite: -She is taking Marinol.  We will send a refill.  4.  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 -She had extensive surgery and denosumab was discontinued.

## 2019-08-23 DIAGNOSIS — B351 Tinea unguium: Secondary | ICD-10-CM | POA: Diagnosis not present

## 2019-08-23 DIAGNOSIS — G629 Polyneuropathy, unspecified: Secondary | ICD-10-CM | POA: Diagnosis not present

## 2019-09-11 ENCOUNTER — Other Ambulatory Visit: Payer: Self-pay

## 2019-09-12 ENCOUNTER — Inpatient Hospital Stay (HOSPITAL_COMMUNITY): Payer: Medicare HMO

## 2019-09-12 ENCOUNTER — Encounter (HOSPITAL_COMMUNITY): Payer: Self-pay | Admitting: Hematology

## 2019-09-12 ENCOUNTER — Inpatient Hospital Stay (HOSPITAL_COMMUNITY): Payer: Medicare HMO | Attending: Hematology

## 2019-09-12 ENCOUNTER — Inpatient Hospital Stay (HOSPITAL_BASED_OUTPATIENT_CLINIC_OR_DEPARTMENT_OTHER): Payer: Medicare HMO | Admitting: Hematology

## 2019-09-12 VITALS — BP 111/76 | HR 55 | Temp 98.6°F | Resp 16

## 2019-09-12 VITALS — BP 129/76 | HR 90 | Temp 97.5°F | Resp 18 | Wt 103.3 lb

## 2019-09-12 DIAGNOSIS — C3411 Malignant neoplasm of upper lobe, right bronchus or lung: Secondary | ICD-10-CM

## 2019-09-12 DIAGNOSIS — Z5112 Encounter for antineoplastic immunotherapy: Secondary | ICD-10-CM | POA: Diagnosis present

## 2019-09-12 DIAGNOSIS — E278 Other specified disorders of adrenal gland: Secondary | ICD-10-CM | POA: Insufficient documentation

## 2019-09-12 DIAGNOSIS — Z79899 Other long term (current) drug therapy: Secondary | ICD-10-CM | POA: Diagnosis not present

## 2019-09-12 DIAGNOSIS — Z23 Encounter for immunization: Secondary | ICD-10-CM | POA: Diagnosis present

## 2019-09-12 LAB — CBC WITH DIFFERENTIAL/PLATELET
Abs Immature Granulocytes: 0.03 10*3/uL (ref 0.00–0.07)
Basophils Absolute: 0.1 10*3/uL (ref 0.0–0.1)
Basophils Relative: 1 %
Eosinophils Absolute: 0.3 10*3/uL (ref 0.0–0.5)
Eosinophils Relative: 3 %
HCT: 40 % (ref 36.0–46.0)
Hemoglobin: 13 g/dL (ref 12.0–15.0)
Immature Granulocytes: 0 %
Lymphocytes Relative: 37 %
Lymphs Abs: 2.9 10*3/uL (ref 0.7–4.0)
MCH: 31.3 pg (ref 26.0–34.0)
MCHC: 32.5 g/dL (ref 30.0–36.0)
MCV: 96.4 fL (ref 80.0–100.0)
Monocytes Absolute: 0.8 10*3/uL (ref 0.1–1.0)
Monocytes Relative: 11 %
Neutro Abs: 3.7 10*3/uL (ref 1.7–7.7)
Neutrophils Relative %: 48 %
Platelets: 296 10*3/uL (ref 150–400)
RBC: 4.15 MIL/uL (ref 3.87–5.11)
RDW: 14.8 % (ref 11.5–15.5)
WBC: 7.8 10*3/uL (ref 4.0–10.5)
nRBC: 0 % (ref 0.0–0.2)

## 2019-09-12 LAB — COMPREHENSIVE METABOLIC PANEL
ALT: 14 U/L (ref 0–44)
AST: 18 U/L (ref 15–41)
Albumin: 3.9 g/dL (ref 3.5–5.0)
Alkaline Phosphatase: 66 U/L (ref 38–126)
Anion gap: 9 (ref 5–15)
BUN: 15 mg/dL (ref 8–23)
CO2: 28 mmol/L (ref 22–32)
Calcium: 9.9 mg/dL (ref 8.9–10.3)
Chloride: 103 mmol/L (ref 98–111)
Creatinine, Ser: 1.39 mg/dL — ABNORMAL HIGH (ref 0.44–1.00)
GFR calc Af Amer: 47 mL/min — ABNORMAL LOW (ref >60)
GFR calc non Af Amer: 40 mL/min — ABNORMAL LOW (ref >60)
Glucose, Bld: 85 mg/dL (ref 70–99)
Potassium: 3.8 mmol/L (ref 3.5–5.1)
Sodium: 140 mmol/L (ref 135–145)
Total Bilirubin: 0.5 mg/dL (ref 0.3–1.2)
Total Protein: 7.4 g/dL (ref 6.5–8.1)

## 2019-09-12 LAB — TSH: TSH: 2.633 u[IU]/mL (ref 0.350–4.500)

## 2019-09-12 LAB — LACTATE DEHYDROGENASE: LDH: 117 U/L (ref 98–192)

## 2019-09-12 MED ORDER — SODIUM CHLORIDE 0.9 % IV SOLN
Freq: Once | INTRAVENOUS | Status: AC
  Administered 2019-09-12: 11:00:00 via INTRAVENOUS

## 2019-09-12 MED ORDER — SODIUM CHLORIDE 0.9% FLUSH
10.0000 mL | INTRAVENOUS | Status: DC | PRN
  Administered 2019-09-12: 10 mL
  Filled 2019-09-12: qty 10

## 2019-09-12 MED ORDER — INFLUENZA VAC SPLIT QUAD 0.5 ML IM SUSY
PREFILLED_SYRINGE | INTRAMUSCULAR | Status: AC
  Filled 2019-09-12: qty 0.5

## 2019-09-12 MED ORDER — SODIUM CHLORIDE 0.9 % IV SOLN
480.0000 mg | Freq: Once | INTRAVENOUS | Status: AC
  Administered 2019-09-12: 480 mg via INTRAVENOUS
  Filled 2019-09-12: qty 48

## 2019-09-12 MED ORDER — HEPARIN SOD (PORK) LOCK FLUSH 100 UNIT/ML IV SOLN
500.0000 [IU] | Freq: Once | INTRAVENOUS | Status: AC | PRN
  Administered 2019-09-12: 12:00:00 500 [IU]

## 2019-09-12 MED ORDER — INFLUENZA VAC SPLIT QUAD 0.5 ML IM SUSY
0.5000 mL | PREFILLED_SYRINGE | Freq: Once | INTRAMUSCULAR | Status: AC
  Administered 2019-09-12: 0.5 mL via INTRAMUSCULAR

## 2019-09-12 NOTE — Patient Instructions (Addendum)
[place] at [facility] Discharge Instructions  You were seen today by Dr. Delton Coombes. He went over your recent lab results. He will repeat your scans prior to your next visit. He will see you back in 4 weeks for labs, treatment and follow up.   Thank you for choosing [place] at [facility] to provide your oncology and hematology care.  To afford each patient quality time with our provider, please arrive at least 15 minutes before your scheduled appointment time.   If you have a lab appointment with the [facility] please come in thru the  Main Entrance and check in at the main information desk  You need to re-schedule your appointment should you arrive 10 or more minutes late.  We strive to give you quality time with our providers, and arriving late affects you and other patients whose appointments are after yours.  Also, if you no show three or more times for appointments you may be dismissed from the clinic at the providers discretion.     Again, thank you for choosing [facility].  Our hope is that these requests will decrease the amount of time that you wait before being seen by our physicians.       _____________________________________________________________  Should you have questions after your visit to [facility], please contact our office at [phone] between the hours of 8:00 a.m. and 4:30 p.m.  Voicemails left after 4:00 p.m. will not be returned until the following business day.  For prescription refill requests, have your pharmacy contact our office and allow 72 hours.    Cancer Center Support Programs:   > Cancer Support Group  2nd Tuesday of the month 1pm-2pm, Journey Room

## 2019-09-12 NOTE — Progress Notes (Signed)
Melissa Sullivan, [place] [address]   CLINIC:  Medical Oncology/Hematology  PCP:  [name], MD [place] [address] [phone]   REASON FOR VISIT: Follow-up for metastatic poorly differentiated adenocarcinoma of the lung to the bones and right adrenal  CURRENT THERAPY:Opidvo every 4 weeks  BRIEF ONCOLOGIC HISTORY:  Oncology History  Cancer of upper lobe of right lung ([place])  07/28/2014 Imaging   CT chest: Large R apical mass consistent with malignancy. This is destroying the R 2nd rib with extension into adjacent soft tissue. R hilar adenopathy with R 5cm adrenal metastatic lesion.   08/01/2014 Initial Biopsy   Lung, needle/core biopsy(ies), right upper lobe - POORLY DIFFERENTIATED ADENOCARCINOMA, SEE COMMENT.   08/08/2014 PET scan   Large hypermetabolic R apical mass with evidence of direct chest wall and mediastinal invasion, right retrocrural lymphadenopathy, extensive retroperitoneal lymphadenopathy, and metastatic lesions to the adrenal glands    09/02/2014 - 11/04/2014 Chemotherapy   Cisplatin/Pemetrexed/Avastin every 21 days x 4 cycles   10/07/2014 - 10/27/2014 Radiation Therapy   Right lung apex for control of brachioplexopathy.   12/24/2014 - 02/25/2015 Chemotherapy   Alimta/Avastin every 21 days.   02/20/2015 Imaging   Increase in size of right adrenal metastasis and subjacent confluent retrocaval lymphadenopathy   02/25/2015 -  Chemotherapy   Nivolumab, zometa   05/04/2015 Imaging   CT CAP- Stable to slight decrease in the posterior right apical lesion. Stable appearance of posterior right upper rib an upper thoracic bony lesions. Slight improvement in right upper lobe tree-in-bud opacity. No new or progressive findings in...   07/28/2015 Imaging   CT CAP- Reduced size of the right apical pleural parenchymal lesion and reduced size of the right adrenal metastatic lesion. Resolution of prior retrocrural adenopathy.   Right eccentric T1 and T2 sclerosis with sclerosis and tapering of the right second..   11/17/2015 Imaging   CT CAP- Stable soft tissue thickening in the apex of the right hemi thorax. Stable right adrenal metastasis. Nodularity along the trachea and mainstem bronchi, relatively new from 07/28/2015, favoring adherent debris.   11/18/2015 Treatment Plan Change   Zometa HELD for upcoming tooth extraction   11/24/2015 Treatment Plan Change   Zometa on hold at this time in preparation for tooth extraction in March 2017.  Zometa las given on 11/18/2015.   02/03/2016 Imaging   CT CAP- Heterogeneous right apical masslike consolidation and right adrenal metastasis are unchanged   04/06/2016 Treatment Plan Change   Zometa restarted 6 weeks out from tooth extraction (04/06/2016)   05/12/2016 Imaging   CT CAP- NED in the chest, abdomen or pelvis.  Some areas of nodularity associated with the mainstem bronchi in the left upper lobe bronchus, favored to represent adherent inspissated secretions   08/17/2016 Imaging   CT CAP- 1. Stable CTs of the chest and abdomen. No evidence of progressive metastatic disease. 2. Probable treated tumor at the right apex, right adrenal gland and T2 vertebral body, stable. 3. Fluctuating nodularity along the walls of the trachea and mainstem bronchi, likely secretions.   12/12/2016 Imaging   Further decrease in size of treated tumor within the right apex. 2. Stable treated tumor involving the right second rib and T2 vertebra. 3. Stable right adrenal gland treated tumor. 4. Emphysema 5. Aortic atherosclerosis   01/04/2017 Imaging   MRI brain- Normal brain MRI.  No intracranial metastatic disease.   03/15/2017 Imaging   CT CAP- 1. No  new or progressive metastatic disease in the chest or abdomen. 2. Stable treated tumor in the apical right upper lobe. Stable treated right posterior second rib and right T2 vertebral lesions. Stable treated right adrenal metastasis. 3.  Aortic atherosclerosis. 4. Moderate emphysema with mild diffuse bronchial wall thickening, suggesting COPD.   06/12/2017 Imaging   CT CAP 1. Similar appearance of treated primary within the right apex. 2. Similar areas of sclerosis within the right second rib, T2, and less so T1 vertebral bodies. These are most consistent with treated metastasis. 3. Similar right adrenal treated metastasis. 4. No evidence of new or progressive disease. 5. Similar right and progressive left areas of bronchial wall thickening and probable mucoid impaction. Correlate with interval infectious symptoms. 6.  Emphysema (ICD10-J43.9). 7. Coronary artery atherosclerosis. Aortic Atherosclerosis (ICD10-I70.0).    10/16/2017 Imaging   CT CAP 1. Stable appearance of the prior Pancoast tumor and related bony findings in the right second rib and right T1 and T2 vertebra compatible with successfully treated tumor. No significant enlargement or new lesions are identified. Similarly the treated right adrenal metastatic lesion is stable in appearance. 2. Upper normal size right hilar lymph node may warrant surveillance. Currently 9 mm in short axis. 3. Other imaging findings of potential clinical significance: Aortic Atherosclerosis (ICD10-I70.0) and Emphysema (ICD10-J43.9). Scattered proximal sigmoid colon diverticula. Mucus plugging medially in the left upper lobe and posteriorly in the right upper lobe.      INTERVAL HISTORY:  Melissa Sullivan 63 y.o. female seen for follow-up of metastatic lung cancer.  Appetite and energy levels are 100%.  No pain is reported.  She is continuing to have dental debridement done by Dr. Luvenia Heller.  Denies any immunotherapy related side effects like diarrhea, cough or skin rashes.  No headaches or vision changes reported.  No fevers or infections.  Leg pains are very well controlled with oxycodone as needed.  REVIEW OF SYSTEMS:  Review of Systems  Musculoskeletal:       Occasional leg  pains.  All other systems reviewed and are negative.    PAST MEDICAL/SURGICAL HISTORY:  Past Medical History:  Diagnosis Date  . Adrenal mass, right ([place]) 07/28/2014  . Anemia   . Bone metastases ([place]) 04/05/2016  . Cancer ([place])    lung  right  . Diabetes mellitus without complication ([place])   . GERD (gastroesophageal reflux disease)   . Hyperlipidemia   . Hypertension   . Hypothyroidism due to medication 01/30/2017  . Lung mass 07/28/2014  . Reflux    Past Surgical History:  Procedure Laterality Date  . AMPUTATION Right 02/22/2017   Procedure: PARTIAL AMPUTATION RIGHT GREAT TOE;  Surgeon: Caprice Beaver, DPM;  Location: [facility];  Service: Podiatry;  Laterality: Right;  . APPENDECTOMY    . ESOPHAGOGASTRODUODENOSCOPY N/A 12/09/2014   FXT:[id number] esophageal stricture/mild-to-noderate erosive gastritis. negative H.pylori  . FLEXIBLE SIGMOIDOSCOPY  2011   Dr. [name]: hyperplastic polyp  . LUNG BIOPSY Right 07/2014   CT guided  . MALONEY DILATION N/A 12/09/2014   Procedure: [name] DILATION;  Surgeon: [name], MD;  Location: [facility];  Service: Endoscopy;  Laterality: N/A;  . PORTACATH PLACEMENT Left 09/01/2014  . SAVORY DILATION N/A 12/09/2014   Procedure: SAVORY DILATION;  Surgeon: [name], MD;  Location: [facility];  Service: Endoscopy;  Laterality: N/A;     SOCIAL HISTORY:  Social History   Socioeconomic History  . Marital status: Married    Spouse name: Not on file  .  Number of children: Not on file  . Years of education: Not on file  . Highest education level: Not on file  Occupational History  . Not on file  Social Needs  . Financial resource strain: Not on file  . Food insecurity    Worry: Not on file    Inability: Not on file  . Transportation needs    Medical: Not on file    Non-medical: Not on file  Tobacco Use  . Smoking status: Light Tobacco Smoker    Packs/day: 0.30    Years: 20.00    Pack years: 6.00    Types: Cigarettes  .  Smokeless tobacco: Never Used  Substance and Sexual Activity  . Alcohol use: No  . Drug use: No  . Sexual activity: Not on file  Lifestyle  . Physical activity    Days per week: Not on file    Minutes per session: Not on file  . Stress: Not on file  Relationships  . Social Herbalist on phone: Not on file    Gets together: Not on file    Attends religious service: Not on file    Active member of club or organization: Not on file    Attends meetings of clubs or organizations: Not on file    Relationship status: Not on file  . Intimate partner violence    Fear of current or ex partner: Not on file    Emotionally abused: Not on file    Physically abused: Not on file    Forced sexual activity: Not on file  Other Topics Concern  . Not on file  Social History Narrative  . Not on file    FAMILY HISTORY:  Family History  Problem Relation Age of Onset  . Cancer Sister   . Colon cancer Neg Hx     CURRENT MEDICATIONS:  Outpatient Encounter Medications as of 09/12/2019  Medication Sig  . amoxicillin (AMOXIL) 500 MG capsule Take 500 mg by mouth every 12 (twelve) hours.  . Calcium Carb-Cholecalciferol (CALCIUM 1000 + D PO) Take 1 tablet by mouth daily.  . chlorhexidine (PERIDEX) 0.12 % solution Use as directed 5 mLs in the mouth or throat as needed.   . dronabinol (MARINOL) 2.5 MG capsule Take 1 capsule (2.5 mg total) by mouth 2 (two) times daily before lunch and supper.  Marland Kitchen ENSURE (ENSURE) Take 1 Can by mouth 4 (four) times daily.  . Fluticasone-Salmeterol (ADVAIR DISKUS) 500-50 MCG/DOSE AEPB Inhale 1 puff into the lungs 2 (two) times daily. (Patient taking differently: Inhale 1 puff into the lungs 2 (two) times daily as needed (for respiratory issues.). )  . ibuprofen (ADVIL,MOTRIN) 200 MG tablet Take 400 mg by mouth every 8 (eight) hours as needed (for pain.).  Marland Kitchen levocetirizine (XYZAL) 5 MG tablet TAKE 1 TABLET BY MOUTH AT BEDTIME FOR ALLERGIES COUGH  . levothyroxine  (SYNTHROID) 25 MCG tablet TAKE 1/2 (ONE-HALF) TABLET BY MOUTH ONCE DAILY BEFORE BREAKFAST  . lidocaine-prilocaine (EMLA) cream APPLY A QUARTER SIZE AMOUNT TO PORT SITE 1 HOUR PRIOR TO CHEMO.  DO NOT RUB IN.  COVER WITH PLASTIC WRAP  . magic mouthwash SOLN Take 5 mLs by mouth 4 (four) times daily as needed for mouth pain.  . Nivolumab (OPDIVO IV) Inject into the vein every 28 (twenty-eight) days.  Marland Kitchen omeprazole (PRILOSEC) 40 MG capsule Take 1 capsule (40 mg total) by mouth daily. TAKE 30 MINS PRIOR TO YOUR FIRST MEAL.  Marland Kitchen ondansetron (ZOFRAN) 8  MG tablet Take 1 tablet (8 mg total) by mouth every 8 (eight) hours as needed for nausea or vomiting.  . Oxycodone HCl 10 MG TABS Take 1-2 tablets (10-20 mg total) by mouth every 12 (twelve) hours as needed (pain.).  Marland Kitchen simvastatin (ZOCOR) 40 MG tablet TAKE ONE TABLET BY MOUTH ONCE DAILY IN THE MORNING   Facility-Administered Encounter Medications as of 09/12/2019  Medication  . [COMPLETED] influenza vac split quadrivalent PF (FLUARIX) injection 0.5 mL  . sodium chloride flush (NS) 0.9 % injection 10 mL    ALLERGIES:  Allergies  Allergen Reactions  . Xgeva [Denosumab]     osteonecrosis     PHYSICAL EXAM:  ECOG Performance status: 1  Vitals:   09/12/19 0900  BP: 129/76  Pulse: 90  Resp: 18  Temp: (!) 97.5 F (36.4 C)  SpO2: 100%   Filed Weights   09/12/19 0900  Weight: 103 lb 5 oz (46.9 kg)    Physical Exam Constitutional:      Appearance: Normal appearance. She is normal weight.  Cardiovascular:     Rate and Rhythm: Normal rate and regular rhythm.     Heart sounds: Normal heart sounds.  Pulmonary:     Effort: Pulmonary effort is normal.     Breath sounds: Normal breath sounds.  Abdominal:     General: There is no distension.     Palpations: Abdomen is soft. There is no mass.  Musculoskeletal: Normal range of motion.  Skin:    General: Skin is warm and dry.  Neurological:     Mental Status: She is alert and oriented to  person, place, and time. Mental status is at baseline.  Psychiatric:        Mood and Affect: Mood normal.        Behavior: Behavior normal.      LABORATORY DATA:  I have reviewed the labs as listed.  CBC    Component Value Date/Time   WBC 7.8 09/12/2019 0915   RBC 4.15 09/12/2019 0915   HGB 13.0 09/12/2019 0915   HCT 40.0 09/12/2019 0915   PLT 296 09/12/2019 0915   MCV 96.4 09/12/2019 0915   MCH 31.3 09/12/2019 0915   MCHC 32.5 09/12/2019 0915   RDW 14.8 09/12/2019 0915   LYMPHSABS 2.9 09/12/2019 0915   MONOABS 0.8 09/12/2019 0915   EOSABS 0.3 09/12/2019 0915   BASOSABS 0.1 09/12/2019 0915   CMP Latest Ref Rng & Units 09/12/2019 08/15/2019 07/18/2019  Glucose 70 - 99 mg/dL 85 116(H) 68(L)  BUN 8 - 23 mg/dL 15 14 13   Creatinine 0.44 - 1.00 mg/dL 1.39(H) 1.29(H) 1.25(H)  Sodium 135 - 145 mmol/L 140 135 141  Potassium 3.5 - 5.1 mmol/L 3.8 3.8 4.1  Chloride 98 - 111 mmol/L 103 104 105  CO2 22 - 32 mmol/L 28 24 26   Calcium 8.9 - 10.3 mg/dL 9.9 9.7 10.3  Total Protein 6.5 - 8.1 g/dL 7.4 8.0 8.1  Total Bilirubin 0.3 - 1.2 mg/dL 0.5 0.3 0.5  Alkaline Phos 38 - 126 U/L 66 63 61  AST 15 - 41 U/L 18 26 19   ALT 0 - 44 U/L 14 25 15        DIAGNOSTIC IMAGING:  I have independently reviewed the scans and discussed with the patient.     ASSESSMENT & PLAN:   Cancer of upper lobe of right lung 1.  Metastatic adenocarcinoma of the lung to the bones and right adrenal: -Foundation 1 testing shows 14 genomic alterations, no  approved FDA therapies. -Opdivo 400 mg monthly started on 02/25/2015.  She is continuing to tolerate immunotherapy without any side effects. -CT CAP on 06/17/2019 showed stable exam with similar-appearing sclerotic lesion in the T2 vertebral body and partially calcified right adrenal nodule.  No new findings to suggest metastatic disease.  New 4 mm right upper lobe lung nodule may be infectious/inflammatory in etiology. -She is tolerating immunotherapy very well.   We reviewed her labs. -She may proceed with her next cycle today.  I will see her back in 4 weeks for follow-up.  I plan to repeat CT CAP prior to next visit.  2.  Back pain and leg pain: -She takes oxycodone 1 tablet every other day as needed.  3.  Appetite: -She is taking Marinol.  We will send a refill.  4.  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 -She is still having some debridement done by Dr. Luvenia Heller.     Total time spent is 25 minutes with more than 50% of the time spent face-to-face discussing treatment plan, counseling and coordination of care. Orders placed this encounter:  Orders Placed This Encounter  Procedures  . CT Abdomen Pelvis W Contrast  . CT Chest W Contrast  . CBC with Differential/Platelet  . Comprehensive metabolic panel      Derek Jack, MD [place] [phone]

## 2019-09-12 NOTE — Progress Notes (Signed)
Labs reviewed with MD today at office visit. Will proceed with treatment today per MD.   Treatment given per orders. Patient tolerated it well without problems. Vitals stable and discharged home from clinic ambulatory. Follow up as scheduled.

## 2019-09-18 DIAGNOSIS — E440 Moderate protein-calorie malnutrition: Secondary | ICD-10-CM | POA: Diagnosis not present

## 2019-09-18 DIAGNOSIS — M546 Pain in thoracic spine: Secondary | ICD-10-CM | POA: Diagnosis not present

## 2019-09-18 DIAGNOSIS — R634 Abnormal weight loss: Secondary | ICD-10-CM | POA: Diagnosis not present

## 2019-09-18 DIAGNOSIS — C3490 Malignant neoplasm of unspecified part of unspecified bronchus or lung: Secondary | ICD-10-CM | POA: Diagnosis not present

## 2019-09-18 DIAGNOSIS — C799 Secondary malignant neoplasm of unspecified site: Secondary | ICD-10-CM | POA: Diagnosis not present

## 2019-09-18 DIAGNOSIS — M87180 Osteonecrosis due to drugs, jaw: Secondary | ICD-10-CM | POA: Diagnosis not present

## 2019-09-20 NOTE — Assessment & Plan Note (Signed)
1.  Metastatic adenocarcinoma of the lung to the bones and right adrenal: -Foundation 1 testing shows 14 genomic alterations, no approved FDA therapies. -Opdivo 400 mg monthly started on 02/25/2015.  She is continuing to tolerate immunotherapy without any side effects. -CT CAP on 06/17/2019 showed stable exam with similar-appearing sclerotic lesion in the T2 vertebral body and partially calcified right adrenal nodule.  No new findings to suggest metastatic disease.  New 4 mm right upper lobe lung nodule may be infectious/inflammatory in etiology. -She is tolerating immunotherapy very well.  We reviewed her labs. -She may proceed with her next cycle today.  I will see her back in 4 weeks for follow-up.  I plan to repeat CT CAP prior to next visit.  2.  Back pain and leg pain: -She takes oxycodone 1 tablet every other day as needed.  3.  Appetite: -She is taking Marinol.  We will send a refill.  4.  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 -She is still having some debridement done by Dr. Luvenia Heller.

## 2019-10-03 ENCOUNTER — Other Ambulatory Visit: Payer: Self-pay

## 2019-10-03 ENCOUNTER — Ambulatory Visit (HOSPITAL_COMMUNITY)
Admission: RE | Admit: 2019-10-03 | Discharge: 2019-10-03 | Disposition: A | Discharge status: Home / Self Care | Payer: Medicare HMO | Source: Ambulatory Visit | Attending: Hematology | Admitting: Hematology

## 2019-10-03 DIAGNOSIS — C3411 Malignant neoplasm of upper lobe, right bronchus or lung: Secondary | ICD-10-CM | POA: Insufficient documentation

## 2019-10-03 DIAGNOSIS — C3491 Malignant neoplasm of unspecified part of right bronchus or lung: Secondary | ICD-10-CM | POA: Diagnosis not present

## 2019-10-03 DIAGNOSIS — C7971 Secondary malignant neoplasm of right adrenal gland: Secondary | ICD-10-CM | POA: Diagnosis not present

## 2019-10-03 MED ORDER — IOHEXOL 300 MG/ML  SOLN
75.0000 mL | Freq: Once | INTRAMUSCULAR | Status: AC | PRN
  Administered 2019-10-03: 75 mL via INTRAVENOUS

## 2019-10-10 ENCOUNTER — Inpatient Hospital Stay (HOSPITAL_COMMUNITY): Payer: Medicare HMO

## 2019-10-10 ENCOUNTER — Inpatient Hospital Stay (HOSPITAL_COMMUNITY): Payer: Medicare HMO | Attending: Hematology

## 2019-10-10 ENCOUNTER — Inpatient Hospital Stay (HOSPITAL_BASED_OUTPATIENT_CLINIC_OR_DEPARTMENT_OTHER): Payer: Medicare HMO | Admitting: Hematology

## 2019-10-10 ENCOUNTER — Encounter (HOSPITAL_COMMUNITY): Payer: Self-pay | Admitting: Hematology

## 2019-10-10 ENCOUNTER — Other Ambulatory Visit: Payer: Self-pay

## 2019-10-10 VITALS — BP 116/62 | HR 75 | Temp 96.9°F | Resp 17

## 2019-10-10 DIAGNOSIS — Z923 Personal history of irradiation: Secondary | ICD-10-CM | POA: Insufficient documentation

## 2019-10-10 DIAGNOSIS — C3411 Malignant neoplasm of upper lobe, right bronchus or lung: Secondary | ICD-10-CM

## 2019-10-10 DIAGNOSIS — C7971 Secondary malignant neoplasm of right adrenal gland: Secondary | ICD-10-CM | POA: Diagnosis not present

## 2019-10-10 DIAGNOSIS — Z5112 Encounter for antineoplastic immunotherapy: Secondary | ICD-10-CM | POA: Diagnosis present

## 2019-10-10 DIAGNOSIS — C7951 Secondary malignant neoplasm of bone: Secondary | ICD-10-CM | POA: Insufficient documentation

## 2019-10-10 DIAGNOSIS — F17210 Nicotine dependence, cigarettes, uncomplicated: Secondary | ICD-10-CM | POA: Insufficient documentation

## 2019-10-10 DIAGNOSIS — Z791 Long term (current) use of non-steroidal anti-inflammatories (NSAID): Secondary | ICD-10-CM | POA: Diagnosis not present

## 2019-10-10 DIAGNOSIS — E785 Hyperlipidemia, unspecified: Secondary | ICD-10-CM | POA: Insufficient documentation

## 2019-10-10 DIAGNOSIS — M549 Dorsalgia, unspecified: Secondary | ICD-10-CM | POA: Insufficient documentation

## 2019-10-10 DIAGNOSIS — I10 Essential (primary) hypertension: Secondary | ICD-10-CM | POA: Diagnosis not present

## 2019-10-10 DIAGNOSIS — K219 Gastro-esophageal reflux disease without esophagitis: Secondary | ICD-10-CM | POA: Diagnosis not present

## 2019-10-10 DIAGNOSIS — Z7951 Long term (current) use of inhaled steroids: Secondary | ICD-10-CM | POA: Insufficient documentation

## 2019-10-10 DIAGNOSIS — M79606 Pain in leg, unspecified: Secondary | ICD-10-CM | POA: Diagnosis not present

## 2019-10-10 DIAGNOSIS — Z79899 Other long term (current) drug therapy: Secondary | ICD-10-CM | POA: Insufficient documentation

## 2019-10-10 DIAGNOSIS — R69 Illness, unspecified: Secondary | ICD-10-CM | POA: Diagnosis not present

## 2019-10-10 DIAGNOSIS — E278 Other specified disorders of adrenal gland: Secondary | ICD-10-CM

## 2019-10-10 LAB — CBC WITH DIFFERENTIAL/PLATELET
Abs Immature Granulocytes: 0.05 10*3/uL (ref 0.00–0.07)
Basophils Absolute: 0 10*3/uL (ref 0.0–0.1)
Basophils Relative: 0 %
Eosinophils Absolute: 0.2 10*3/uL (ref 0.0–0.5)
Eosinophils Relative: 2 %
HCT: 44.7 % (ref 36.0–46.0)
Hemoglobin: 14.3 g/dL (ref 12.0–15.0)
Immature Granulocytes: 1 %
Lymphocytes Relative: 25 %
Lymphs Abs: 2.2 10*3/uL (ref 0.7–4.0)
MCH: 30.7 pg (ref 26.0–34.0)
MCHC: 32 g/dL (ref 30.0–36.0)
MCV: 95.9 fL (ref 80.0–100.0)
Monocytes Absolute: 0.7 10*3/uL (ref 0.1–1.0)
Monocytes Relative: 8 %
Neutro Abs: 5.7 10*3/uL (ref 1.7–7.7)
Neutrophils Relative %: 64 %
Platelets: 288 10*3/uL (ref 150–400)
RBC: 4.66 MIL/uL (ref 3.87–5.11)
RDW: 14.7 % (ref 11.5–15.5)
WBC: 8.9 10*3/uL (ref 4.0–10.5)
nRBC: 0 % (ref 0.0–0.2)

## 2019-10-10 LAB — COMPREHENSIVE METABOLIC PANEL
ALT: 16 U/L (ref 0–44)
AST: 19 U/L (ref 15–41)
Albumin: 4 g/dL (ref 3.5–5.0)
Alkaline Phosphatase: 71 U/L (ref 38–126)
Anion gap: 9 (ref 5–15)
BUN: 14 mg/dL (ref 8–23)
CO2: 28 mmol/L (ref 22–32)
Calcium: 10.4 mg/dL — ABNORMAL HIGH (ref 8.9–10.3)
Chloride: 102 mmol/L (ref 98–111)
Creatinine, Ser: 1.22 mg/dL — ABNORMAL HIGH (ref 0.44–1.00)
GFR calc Af Amer: 55 mL/min — ABNORMAL LOW (ref >60)
GFR calc non Af Amer: 47 mL/min — ABNORMAL LOW (ref >60)
Glucose, Bld: 84 mg/dL (ref 70–99)
Potassium: 4.2 mmol/L (ref 3.5–5.1)
Sodium: 139 mmol/L (ref 135–145)
Total Bilirubin: 0.7 mg/dL (ref 0.3–1.2)
Total Protein: 8.1 g/dL (ref 6.5–8.1)

## 2019-10-10 MED ORDER — HEPARIN SOD (PORK) LOCK FLUSH 100 UNIT/ML IV SOLN: 500.0000 [IU] | Freq: Once | INTRAVENOUS | Status: DC | PRN

## 2019-10-10 MED ORDER — SODIUM CHLORIDE 0.9 % IV SOLN
INTRAVENOUS | Status: DC
  Administered 2019-10-10: 11:00:00 via INTRAVENOUS

## 2019-10-10 MED ORDER — SODIUM CHLORIDE 0.9 % IV SOLN
Freq: Once | INTRAVENOUS | Status: AC
  Administered 2019-10-10: 11:00:00 via INTRAVENOUS

## 2019-10-10 MED ORDER — SODIUM CHLORIDE 0.9 % IV SOLN
480.0000 mg | Freq: Once | INTRAVENOUS | Status: AC
  Administered 2019-10-10: 480 mg via INTRAVENOUS
  Filled 2019-10-10: qty 48

## 2019-10-10 MED ORDER — SODIUM CHLORIDE 0.9% FLUSH
10.0000 mL | INTRAVENOUS | Status: DC | PRN
  Administered 2019-10-10: 10 mL
  Filled 2019-10-10: qty 10

## 2019-10-10 NOTE — Progress Notes (Signed)
10/10/19  Elevated Calcium level - patient being instructed to stop oral calcium supplement today.  T.O. Dr Beckey Downing LPN/Jamaurie Bernier Ronnald Ramp, PharmD

## 2019-10-10 NOTE — Progress Notes (Signed)
[place], [place] [address]   CLINIC:  Medical Oncology/Hematology  PCP:  [name], MD [place] [id number] [phone]   REASON FOR VISIT: Follow-up for metastatic poorly differentiated adenocarcinoma of the lung to the bones and right adrenal  CURRENT THERAPY:Opidvo every 4 weeks  BRIEF ONCOLOGIC HISTORY:  Oncology History  Cancer of upper lobe of right lung ([place])  07/28/2014 Imaging   CT chest: Large R apical mass consistent with malignancy. This is destroying the R 2nd rib with extension into adjacent soft tissue. R hilar adenopathy with R 5cm adrenal metastatic lesion.   08/01/2014 Initial Biopsy   Lung, needle/core biopsy(ies), right upper lobe - POORLY DIFFERENTIATED ADENOCARCINOMA, SEE COMMENT.   08/08/2014 PET scan   Large hypermetabolic R apical mass with evidence of direct chest wall and mediastinal invasion, right retrocrural lymphadenopathy, extensive retroperitoneal lymphadenopathy, and metastatic lesions to the adrenal glands    09/02/2014 - 11/04/2014 Chemotherapy   Cisplatin/Pemetrexed/Avastin every 21 days x 4 cycles   10/07/2014 - 10/27/2014 Radiation Therapy   Right lung apex for control of brachioplexopathy.   12/24/2014 - 02/25/2015 Chemotherapy   Alimta/Avastin every 21 days.   02/20/2015 Imaging   Increase in size of right adrenal metastasis and subjacent confluent retrocaval lymphadenopathy   02/25/2015 -  Chemotherapy   Nivolumab, zometa   05/04/2015 Imaging   CT CAP- Stable to slight decrease in the posterior right apical lesion. Stable appearance of posterior right upper rib an upper thoracic bony lesions. Slight improvement in right upper lobe tree-in-bud opacity. No new or progressive findings in...   07/28/2015 Imaging   CT CAP- Reduced size of the right apical pleural parenchymal lesion and reduced size of the right adrenal metastatic lesion. Resolution of prior retrocrural adenopathy.   Right eccentric T1 and T2 sclerosis with sclerosis and tapering of the right second..   11/17/2015 Imaging   CT CAP- Stable soft tissue thickening in the apex of the right hemi thorax. Stable right adrenal metastasis. Nodularity along the trachea and mainstem bronchi, relatively new from 07/28/2015, favoring adherent debris.   11/18/2015 Treatment Plan Change   Zometa HELD for upcoming tooth extraction   11/24/2015 Treatment Plan Change   Zometa on hold at this time in preparation for tooth extraction in March 2017.  Zometa las given on 11/18/2015.   02/03/2016 Imaging   CT CAP- Heterogeneous right apical masslike consolidation and right adrenal metastasis are unchanged   04/06/2016 Treatment Plan Change   Zometa restarted 6 weeks out from tooth extraction (04/06/2016)   05/12/2016 Imaging   CT CAP- NED in the chest, abdomen or pelvis.  Some areas of nodularity associated with the mainstem bronchi in the left upper lobe bronchus, favored to represent adherent inspissated secretions   08/17/2016 Imaging   CT CAP- 1. Stable CTs of the chest and abdomen. No evidence of progressive metastatic disease. 2. Probable treated tumor at the right apex, right adrenal gland and T2 vertebral body, stable. 3. Fluctuating nodularity along the walls of the trachea and mainstem bronchi, likely secretions.   12/12/2016 Imaging   Further decrease in size of treated tumor within the right apex. 2. Stable treated tumor involving the right second rib and T2 vertebra. 3. Stable right adrenal gland treated tumor. 4. Emphysema 5. Aortic atherosclerosis   01/04/2017 Imaging   MRI brain- Normal brain MRI.  No intracranial metastatic disease.   03/15/2017 Imaging   CT CAP- 1. No  new or progressive metastatic disease in the chest or abdomen. 2. Stable treated tumor in the apical right upper lobe. Stable treated right posterior second rib and right T2 vertebral lesions. Stable treated right adrenal metastasis. 3.  Aortic atherosclerosis. 4. Moderate emphysema with mild diffuse bronchial wall thickening, suggesting COPD.   06/12/2017 Imaging   CT CAP 1. Similar appearance of treated primary within the right apex. 2. Similar areas of sclerosis within the right second rib, T2, and less so T1 vertebral bodies. These are most consistent with treated metastasis. 3. Similar right adrenal treated metastasis. 4. No evidence of new or progressive disease. 5. Similar right and progressive left areas of bronchial wall thickening and probable mucoid impaction. Correlate with interval infectious symptoms. 6.  Emphysema (ICD10-J43.9). 7. Coronary artery atherosclerosis. Aortic Atherosclerosis (ICD10-I70.0).    10/16/2017 Imaging   CT CAP 1. Stable appearance of the prior [place]ast tumor and related bony findings in the right second rib and right T1 and T2 vertebra compatible with successfully treated tumor. No significant enlargement or new lesions are identified. Similarly the treated right adrenal metastatic lesion is stable in appearance. 2. Upper normal size right hilar lymph node may warrant surveillance. Currently 9 mm in short axis. 3. Other imaging findings of potential clinical significance: Aortic Atherosclerosis (ICD10-I70.0) and Emphysema (ICD10-J43.9). Scattered proximal sigmoid colon diverticula. Mucus plugging medially in the left upper lobe and posteriorly in the right upper lobe.      INTERVAL HISTORY:  Melissa Sullivan 63 y.o. female seen for follow-up of metastatic lung cancer.  Denies any diarrhea.  Denies any skin rashes or dry cough.  Appetite and energy levels are 75%.  No fevers or chills reported.  No ER visits or hospitalizations.  REVIEW OF SYSTEMS:  Review of Systems  Musculoskeletal:       Occasional leg pains.  All other systems reviewed and are negative.    PAST MEDICAL/SURGICAL HISTORY:  Past Medical History:  Diagnosis Date  . Adrenal mass, right ([place]) 07/28/2014   . Anemia   . Bone metastases ([place]) 04/05/2016  . Cancer ([place])    lung  right  . Diabetes mellitus without complication ([place])   . GERD (gastroesophageal reflux disease)   . Hyperlipidemia   . Hypertension   . Hypothyroidism due to medication 01/30/2017  . Lung mass 07/28/2014  . Reflux    Past Surgical History:  Procedure Laterality Date  . AMPUTATION Right 02/22/2017   Procedure: PARTIAL AMPUTATION RIGHT GREAT TOE;  Surgeon: Caprice Beaver, DPM;  Location: [facility] OR[facility];  Service: Podiatry;  Laterality: Right;  . APPENDECTOMY    . ESOPHAGOGASTRODUODENOSCOPY N/A 12/09/2014   ZOX:WRUEAV esophageal stricture/mild-to-noderate erosive gastritis. negative H.pylori  . FLEXIBLE SIGMOIDOSCOPY  2011   Dr. [name]: hyperplastic polyp  . LUNG BIOPSY Right 07/2014   CT guided  . MALONEY DILATION N/A 12/09/2014   Procedure: [name] DILATION;  Surgeon: [name], MD;  Location: [facility] SU[facility];  Service: Endoscopy;  Laterality: N/A;  . PORTACATH PLACEMENT Left 09/01/2014  . SAVORY DILATION N/A 12/09/2014   Procedure: SAVORY DILATION;  Surgeon: [name], MD;  Location: [facility] SU[facility];  Service: Endoscopy;  Laterality: N/A;     SOCIAL HISTORY:  Social History   Socioeconomic History  . Marital status: Married    Spouse name: Not on file  . Number of children: Not on file  . Years of education: Not on file  . Highest education level: Not on file  Occupational History  . Not on  file  Social Needs  . Financial resource strain: Not on file  . Food insecurity    Worry: Not on file    Inability: Not on file  . Transportation needs    Medical: Not on file    Non-medical: Not on file  Tobacco Use  . Smoking status: Light Tobacco Smoker    Packs/day: 0.30    Years: 20.00    Pack years: 6.00    Types: Cigarettes  . Smokeless tobacco: Never Used  Substance and Sexual Activity  . Alcohol use: No  . Drug use: No  . Sexual activity: Not on file  Lifestyle  . Physical activity    Days per  week: Not on file    Minutes per session: Not on file  . Stress: Not on file  Relationships  . Social Herbalist on phone: Not on file    Gets together: Not on file    Attends religious service: Not on file    Active member of club or organization: Not on file    Attends meetings of clubs or organizations: Not on file    Relationship status: Not on file  . Intimate partner violence    Fear of current or ex partner: Not on file    Emotionally abused: Not on file    Physically abused: Not on file    Forced sexual activity: Not on file  Other Topics Concern  . Not on file  Social History Narrative  . Not on file    FAMILY HISTORY:  Family History  Problem Relation Age of Onset  . Cancer Sister   . Colon cancer Neg Hx     CURRENT MEDICATIONS:  Outpatient Encounter Medications as of 10/10/2019  Medication Sig  . amoxicillin (AMOXIL) 500 MG capsule Take 500 mg by mouth every 12 (twelve) hours.  . Calcium Carb-Cholecalciferol (CALCIUM 1000 + D PO) Take 1 tablet by mouth daily.  . chlorhexidine (PERIDEX) 0.12 % solution Use as directed 5 mLs in the mouth or throat as needed.   . dronabinol (MARINOL) 2.5 MG capsule Take 1 capsule (2.5 mg total) by mouth 2 (two) times daily before lunch and supper.  Marland Kitchen ENSURE (ENSURE) Take 1 Can by mouth 4 (four) times daily.  . Fluticasone-Salmeterol (ADVAIR DISKUS) 500-50 MCG/DOSE AEPB Inhale 1 puff into the lungs 2 (two) times daily. (Patient taking differently: Inhale 1 puff into the lungs 2 (two) times daily as needed (for respiratory issues.). )  . ibuprofen (ADVIL,MOTRIN) 200 MG tablet Take 400 mg by mouth every 8 (eight) hours as needed (for pain.).  Marland Kitchen levocetirizine (XYZAL) 5 MG tablet TAKE 1 TABLET BY MOUTH AT BEDTIME FOR ALLERGIES COUGH  . levothyroxine (SYNTHROID) 25 MCG tablet TAKE 1/2 (ONE-HALF) TABLET BY MOUTH ONCE DAILY BEFORE BREAKFAST  . lidocaine-prilocaine (EMLA) cream APPLY A QUARTER SIZE AMOUNT TO PORT SITE 1 HOUR PRIOR  TO CHEMO.  DO NOT RUB IN.  COVER WITH PLASTIC WRAP  . magic mouthwash SOLN Take 5 mLs by mouth 4 (four) times daily as needed for mouth pain.  . Nivolumab (OPDIVO IV) Inject into the vein every 28 (twenty-eight) days.  Marland Kitchen omeprazole (PRILOSEC) 40 MG capsule Take 1 capsule (40 mg total) by mouth daily. TAKE 30 MINS PRIOR TO YOUR FIRST MEAL.  Marland Kitchen ondansetron (ZOFRAN) 8 MG tablet Take 1 tablet (8 mg total) by mouth every 8 (eight) hours as needed for nausea or vomiting.  . Oxycodone HCl 10 MG TABS Take 1-2  tablets (10-20 mg total) by mouth every 12 (twelve) hours as needed (pain.).  Marland Kitchen simvastatin (ZOCOR) 40 MG tablet TAKE ONE TABLET BY MOUTH ONCE DAILY IN THE MORNING   Facility-Administered Encounter Medications as of 10/10/2019  Medication  . sodium chloride flush (NS) 0.9 % injection 10 mL    ALLERGIES:  Allergies  Allergen Reactions  . Xgeva [Denosumab]     osteonecrosis     PHYSICAL EXAM:  ECOG Performance status: 1  Vitals:   10/10/19 1000  BP: (!) 149/91  Pulse: 88  Resp: 16  Temp: (!) 96.9 F (36.1 C)  SpO2: 100%   Filed Weights   10/10/19 1000  Weight: 104 lb 9.6 oz (47.4 kg)    Physical Exam Constitutional:      Appearance: Normal appearance. She is normal weight.  Cardiovascular:     Rate and Rhythm: Normal rate and regular rhythm.     Heart sounds: Normal heart sounds.  Pulmonary:     Effort: Pulmonary effort is normal.     Breath sounds: Normal breath sounds.  Abdominal:     General: There is no distension.     Palpations: Abdomen is soft. There is no mass.  Musculoskeletal: Normal range of motion.  Skin:    General: Skin is warm and dry.  Neurological:     Mental Status: She is alert and oriented to person, place, and time. Mental status is at baseline.  Psychiatric:        Mood and Affect: Mood normal.        Behavior: Behavior normal.      LABORATORY DATA:  I have reviewed the labs as listed.  CBC    Component Value Date/Time   WBC 8.9  10/10/2019 0906   RBC 4.66 10/10/2019 0906   HGB 14.3 10/10/2019 0906   HCT 44.7 10/10/2019 0906   PLT 288 10/10/2019 0906   MCV 95.9 10/10/2019 0906   MCH 30.7 10/10/2019 0906   MCHC 32.0 10/10/2019 0906   RDW 14.7 10/10/2019 0906   LYMPHSABS 2.2 10/10/2019 0906   MONOABS 0.7 10/10/2019 0906   EOSABS 0.2 10/10/2019 0906   BASOSABS 0.0 10/10/2019 0906   CMP Latest Ref Rng & Units 10/10/2019 09/12/2019 08/15/2019  Glucose 70 - 99 mg/dL 84 85 116(H)  BUN 8 - 23 mg/dL 14 15 14   Creatinine 0.44 - 1.00 mg/dL 1.22(H) 1.39(H) 1.29(H)  Sodium 135 - 145 mmol/L 139 140 135  Potassium 3.5 - 5.1 mmol/L 4.2 3.8 3.8  Chloride 98 - 111 mmol/L 102 103 104  CO2 22 - 32 mmol/L 28 28 24   Calcium 8.9 - 10.3 mg/dL 10.4(H) 9.9 9.7  Total Protein 6.5 - 8.1 g/dL 8.1 7.4 8.0  Total Bilirubin 0.3 - 1.2 mg/dL 0.7 0.5 0.3  Alkaline Phos 38 - 126 U/L 71 66 63  AST 15 - 41 U/L 19 18 26   ALT 0 - 44 U/L 16 14 25        DIAGNOSTIC IMAGING:  I have independently reviewed the scans and discussed with the patient.     ASSESSMENT & PLAN:   Cancer of upper lobe of right lung 1.  Metastatic adenocarcinoma of the lung to the bones and right adrenal: -Foundation 1 testing shows 14 genomic alterations, no approved FDA therapies. -Opdivo 400 mg monthly started on 02/25/2015.  She is continuing to tolerate immunotherapy without any side effects. -We reviewed results of CT CAP from 10/03/2019.  Stable posttreatment changes in the right lung apex.  No  evidence of metastatic disease.  4 mm nodule in the right upper lobe along the major fissure.  Stable partially calcified right adrenal lesion.  Unchanged sclerotic lesion at T2 vertebral body. -She is tolerating immunotherapy very well.  We reviewed labs today.  Calcium is 10.4. -We will give her 1 L of fluids.  I have told her to discontinue calcium and vitamin D. -She may proceed with her immunotherapy today.  2.  Back pain and leg pain: -She takes oxycodone 1  tablet every other day as needed.  3.  Appetite: -She is taking Marinol.  We will send a refill.  4.  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  She is having debridements done.   Orders placed this encounter:  No orders of the defined types were placed in this encounter.     Derek Jack, MD [place] [phone]

## 2019-10-10 NOTE — Progress Notes (Signed)
Labs reviewed with MD today. Proceed with treatment. Calcium 10.4 noted by MD.   Instructed patient to discontinue taking her calcium and vitamin D per MD. She understood.   One liter of saline bolus and treatment given today per orders.  Patient tolerated it well without problems. Vitals stable and discharged home from clinic ambulatory. Follow up as scheduled.

## 2019-10-10 NOTE — Assessment & Plan Note (Signed)
1.  Metastatic adenocarcinoma of the lung to the bones and right adrenal: -Foundation 1 testing shows 14 genomic alterations, no approved FDA therapies. -Opdivo 400 mg monthly started on 02/25/2015.  She is continuing to tolerate immunotherapy without any side effects. -We reviewed results of CT CAP from 10/03/2019.  Stable posttreatment changes in the right lung apex.  No evidence of metastatic disease.  4 mm nodule in the right upper lobe along the major fissure.  Stable partially calcified right adrenal lesion.  Unchanged sclerotic lesion at T2 vertebral body. -She is tolerating immunotherapy very well.  We reviewed labs today.  Calcium is 10.4. -We will give her 1 L of fluids.  I have told her to discontinue calcium and vitamin D. -She may proceed with her immunotherapy today.  2.  Back pain and leg pain: -She takes oxycodone 1 tablet every other day as needed.  3.  Appetite: -She is taking Marinol.  We will send a refill.  4.  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  She is having debridements done.

## 2019-10-10 NOTE — Patient Instructions (Addendum)
[place] at [facility] Discharge Instructions  You were seen today by Dr. Delton Coombes. He went over your recent lab results. He will give you fluids today due to your calcium being elevated. He will see you back in 4 weeks for labs and follow up.   Thank you for choosing [place] at [facility] to provide your oncology and hematology care.  To afford each patient quality time with our provider, please arrive at least 15 minutes before your scheduled appointment time.   If you have a lab appointment with the [facility] please come in thru the  Main Entrance and check in at the main information desk  You need to re-schedule your appointment should you arrive 10 or more minutes late.  We strive to give you quality time with our providers, and arriving late affects you and other patients whose appointments are after yours.  Also, if you no show three or more times for appointments you may be dismissed from the clinic at the providers discretion.     Again, thank you for choosing [facility].  Our hope is that these requests will decrease the amount of time that you wait before being seen by our physicians.       _____________________________________________________________  Should you have questions after your visit to [facility], please contact our office at [phone] between the hours of 8:00 a.m. and 4:30 p.m.  Voicemails left after 4:00 p.m. will not be returned until the following business day.  For prescription refill requests, have your pharmacy contact our office and allow 72 hours.    Cancer Center Support Programs:   > Cancer Support Group  2nd Tuesday of the month 1pm-2pm, Journey Room

## 2019-10-10 NOTE — Patient Instructions (Signed)
[facility] Discharge Instructions for Patients Receiving Chemotherapy  Today you received the following chemotherapy agents   To help prevent nausea and vomiting after your treatment, we encourage you to take your nausea medication   If you develop nausea and vomiting that is not controlled by your nausea medication, call the clinic.   BELOW ARE SYMPTOMS THAT SHOULD BE REPORTED IMMEDIATELY:  *FEVER GREATER THAN 100.5 F  *CHILLS WITH OR WITHOUT FEVER  NAUSEA AND VOMITING THAT IS NOT CONTROLLED WITH YOUR NAUSEA MEDICATION  *UNUSUAL SHORTNESS OF BREATH  *UNUSUAL BRUISING OR BLEEDING  TENDERNESS IN MOUTH AND THROAT WITH OR WITHOUT PRESENCE OF ULCERS  *URINARY PROBLEMS  *BOWEL PROBLEMS  UNUSUAL RASH Items with * indicate a potential emergency and should be followed up as soon as possible.  Feel free to call the clinic should you have any questions or concerns. The clinic phone number is [phone].  Please show the CHEMO ALERT CARD at check-in to the Emergency Department and triage nurse.   

## 2019-10-21 ENCOUNTER — Other Ambulatory Visit (HOSPITAL_COMMUNITY): Payer: Self-pay | Admitting: *Deleted

## 2019-11-01 DIAGNOSIS — G629 Polyneuropathy, unspecified: Secondary | ICD-10-CM | POA: Diagnosis not present

## 2019-11-01 DIAGNOSIS — B351 Tinea unguium: Secondary | ICD-10-CM | POA: Diagnosis not present

## 2019-11-07 ENCOUNTER — Inpatient Hospital Stay (HOSPITAL_BASED_OUTPATIENT_CLINIC_OR_DEPARTMENT_OTHER): Payer: Medicare HMO | Admitting: Hematology

## 2019-11-07 ENCOUNTER — Inpatient Hospital Stay (HOSPITAL_COMMUNITY): Payer: Medicare HMO | Attending: Hematology

## 2019-11-07 ENCOUNTER — Other Ambulatory Visit: Payer: Self-pay

## 2019-11-07 ENCOUNTER — Inpatient Hospital Stay (HOSPITAL_COMMUNITY): Payer: Medicare HMO

## 2019-11-07 VITALS — BP 106/59 | HR 72 | Temp 97.1°F | Resp 18

## 2019-11-07 DIAGNOSIS — G8929 Other chronic pain: Secondary | ICD-10-CM | POA: Diagnosis not present

## 2019-11-07 DIAGNOSIS — M79606 Pain in leg, unspecified: Secondary | ICD-10-CM | POA: Insufficient documentation

## 2019-11-07 DIAGNOSIS — Z79899 Other long term (current) drug therapy: Secondary | ICD-10-CM | POA: Diagnosis not present

## 2019-11-07 DIAGNOSIS — C7971 Secondary malignant neoplasm of right adrenal gland: Secondary | ICD-10-CM | POA: Diagnosis not present

## 2019-11-07 DIAGNOSIS — C3411 Malignant neoplasm of upper lobe, right bronchus or lung: Secondary | ICD-10-CM

## 2019-11-07 DIAGNOSIS — K219 Gastro-esophageal reflux disease without esophagitis: Secondary | ICD-10-CM | POA: Insufficient documentation

## 2019-11-07 DIAGNOSIS — M549 Dorsalgia, unspecified: Secondary | ICD-10-CM | POA: Insufficient documentation

## 2019-11-07 DIAGNOSIS — C7951 Secondary malignant neoplasm of bone: Secondary | ICD-10-CM | POA: Insufficient documentation

## 2019-11-07 DIAGNOSIS — Z5112 Encounter for antineoplastic immunotherapy: Secondary | ICD-10-CM | POA: Insufficient documentation

## 2019-11-07 DIAGNOSIS — F17210 Nicotine dependence, cigarettes, uncomplicated: Secondary | ICD-10-CM | POA: Diagnosis not present

## 2019-11-07 DIAGNOSIS — E278 Other specified disorders of adrenal gland: Secondary | ICD-10-CM

## 2019-11-07 DIAGNOSIS — R69 Illness, unspecified: Secondary | ICD-10-CM | POA: Diagnosis not present

## 2019-11-07 LAB — CBC WITH DIFFERENTIAL/PLATELET
Abs Immature Granulocytes: 0.03 10*3/uL (ref 0.00–0.07)
Basophils Absolute: 0 10*3/uL (ref 0.0–0.1)
Basophils Relative: 1 %
Eosinophils Absolute: 0.2 10*3/uL (ref 0.0–0.5)
Eosinophils Relative: 3 %
HCT: 42.7 % (ref 36.0–46.0)
Hemoglobin: 13.7 g/dL (ref 12.0–15.0)
Immature Granulocytes: 0 %
Lymphocytes Relative: 33 %
Lymphs Abs: 2.7 10*3/uL (ref 0.7–4.0)
MCH: 30.9 pg (ref 26.0–34.0)
MCHC: 32.1 g/dL (ref 30.0–36.0)
MCV: 96.4 fL (ref 80.0–100.0)
Monocytes Absolute: 0.7 10*3/uL (ref 0.1–1.0)
Monocytes Relative: 8 %
Neutro Abs: 4.6 10*3/uL (ref 1.7–7.7)
Neutrophils Relative %: 55 %
Platelets: 293 10*3/uL (ref 150–400)
RBC: 4.43 MIL/uL (ref 3.87–5.11)
RDW: 14.6 % (ref 11.5–15.5)
WBC: 8.2 10*3/uL (ref 4.0–10.5)
nRBC: 0 % (ref 0.0–0.2)

## 2019-11-07 LAB — COMPREHENSIVE METABOLIC PANEL
ALT: 12 U/L (ref 0–44)
AST: 17 U/L (ref 15–41)
Albumin: 3.9 g/dL (ref 3.5–5.0)
Alkaline Phosphatase: 66 U/L (ref 38–126)
Anion gap: 9 (ref 5–15)
BUN: 11 mg/dL (ref 8–23)
CO2: 27 mmol/L (ref 22–32)
Calcium: 10.1 mg/dL (ref 8.9–10.3)
Chloride: 104 mmol/L (ref 98–111)
Creatinine, Ser: 1.27 mg/dL — ABNORMAL HIGH (ref 0.44–1.00)
GFR calc Af Amer: 52 mL/min — ABNORMAL LOW (ref >60)
GFR calc non Af Amer: 45 mL/min — ABNORMAL LOW (ref >60)
Glucose, Bld: 103 mg/dL — ABNORMAL HIGH (ref 70–99)
Potassium: 3.7 mmol/L (ref 3.5–5.1)
Sodium: 140 mmol/L (ref 135–145)
Total Bilirubin: 0.4 mg/dL (ref 0.3–1.2)
Total Protein: 7.5 g/dL (ref 6.5–8.1)

## 2019-11-07 LAB — TSH: TSH: 1.916 u[IU]/mL (ref 0.350–4.500)

## 2019-11-07 MED ORDER — SODIUM CHLORIDE 0.9 % IV SOLN: Freq: Once | INTRAVENOUS | Status: AC

## 2019-11-07 MED ORDER — SODIUM CHLORIDE 0.9% FLUSH
10.0000 mL | INTRAVENOUS | Status: DC | PRN
  Administered 2019-11-07: 10 mL

## 2019-11-07 MED ORDER — SODIUM CHLORIDE 0.9 % IV SOLN
480.0000 mg | Freq: Once | INTRAVENOUS | Status: AC
  Administered 2019-11-07: 480 mg via INTRAVENOUS
  Filled 2019-11-07: qty 48

## 2019-11-07 MED ORDER — HEPARIN SOD (PORK) LOCK FLUSH 100 UNIT/ML IV SOLN
500.0000 [IU] | Freq: Once | INTRAVENOUS | Status: AC | PRN
  Administered 2019-11-07: 500 [IU]

## 2019-11-07 NOTE — Progress Notes (Signed)
[place], [place] [address]   CLINIC:  Medical Oncology/Hematology  PCP:  [name], MD [place] [address] [phone]   REASON FOR VISIT: Follow-up for metastatic poorly differentiated adenocarcinoma of the lung to the bones and right adrenal  CURRENT THERAPY:Opidvo every 4 weeks  BRIEF ONCOLOGIC HISTORY:  Oncology History  Cancer of upper lobe of right lung ([place])  07/28/2014 Imaging   CT chest: Large R apical mass consistent with malignancy. This is destroying the R 2nd rib with extension into adjacent soft tissue. R hilar adenopathy with R 5cm adrenal metastatic lesion.   08/01/2014 Initial Biopsy   Lung, needle/core biopsy(ies), right upper lobe - POORLY DIFFERENTIATED ADENOCARCINOMA, SEE COMMENT.   08/08/2014 PET scan   Large hypermetabolic R apical mass with evidence of direct chest wall and mediastinal invasion, right retrocrural lymphadenopathy, extensive retroperitoneal lymphadenopathy, and metastatic lesions to the adrenal glands    09/02/2014 - 11/04/2014 Chemotherapy   Cisplatin/Pemetrexed/Avastin every 21 days x 4 cycles   10/07/2014 - 10/27/2014 Radiation Therapy   Right lung apex for control of brachioplexopathy.   12/24/2014 - 02/25/2015 Chemotherapy   Alimta/Avastin every 21 days.   02/20/2015 Imaging   Increase in size of right adrenal metastasis and subjacent confluent retrocaval lymphadenopathy   02/25/2015 -  Chemotherapy   Nivolumab, zometa   05/04/2015 Imaging   CT CAP- Stable to slight decrease in the posterior right apical lesion. Stable appearance of posterior right upper rib an upper thoracic bony lesions. Slight improvement in right upper lobe tree-in-bud opacity. No new or progressive findings in...   07/28/2015 Imaging   CT CAP- Reduced size of the right apical pleural parenchymal lesion and reduced size of the right adrenal metastatic lesion. Resolution of prior retrocrural adenopathy.   Right eccentric T1 and T2 sclerosis with sclerosis and tapering of the right second..   11/17/2015 Imaging   CT CAP- Stable soft tissue thickening in the apex of the right hemi thorax. Stable right adrenal metastasis. Nodularity along the trachea and mainstem bronchi, relatively new from 07/28/2015, favoring adherent debris.   11/18/2015 Treatment Plan Change   Zometa HELD for upcoming tooth extraction   11/24/2015 Treatment Plan Change   Zometa on hold at this time in preparation for tooth extraction in March 2017.  Zometa las given on 11/18/2015.   02/03/2016 Imaging   CT CAP- Heterogeneous right apical masslike consolidation and right adrenal metastasis are unchanged   04/06/2016 Treatment Plan Change   Zometa restarted 6 weeks out from tooth extraction (04/06/2016)   05/12/2016 Imaging   CT CAP- NED in the chest, abdomen or pelvis.  Some areas of nodularity associated with the mainstem bronchi in the left upper lobe bronchus, favored to represent adherent inspissated secretions   08/17/2016 Imaging   CT CAP- 1. Stable CTs of the chest and abdomen. No evidence of progressive metastatic disease. 2. Probable treated tumor at the right apex, right adrenal gland and T2 vertebral body, stable. 3. Fluctuating nodularity along the walls of the trachea and mainstem bronchi, likely secretions.   12/12/2016 Imaging   Further decrease in size of treated tumor within the right apex. 2. Stable treated tumor involving the right second rib and T2 vertebra. 3. Stable right adrenal gland treated tumor. 4. Emphysema 5. Aortic atherosclerosis   01/04/2017 Imaging   MRI brain- Normal brain MRI.  No intracranial metastatic disease.   03/15/2017 Imaging   CT CAP- 1. No  new or progressive metastatic disease in the chest or abdomen. 2. Stable treated tumor in the apical right upper lobe. Stable treated right posterior second rib and right T2 vertebral lesions. Stable treated right adrenal metastasis. 3.  Aortic atherosclerosis. 4. Moderate emphysema with mild diffuse bronchial wall thickening, suggesting COPD.   06/12/2017 Imaging   CT CAP 1. Similar appearance of treated primary within the right apex. 2. Similar areas of sclerosis within the right second rib, T2, and less so T1 vertebral bodies. These are most consistent with treated metastasis. 3. Similar right adrenal treated metastasis. 4. No evidence of new or progressive disease. 5. Similar right and progressive left areas of bronchial wall thickening and probable mucoid impaction. Correlate with interval infectious symptoms. 6.  Emphysema (ICD10-J43.9). 7. Coronary artery atherosclerosis. Aortic Atherosclerosis (ICD10-I70.0).    10/16/2017 Imaging   CT CAP 1. Stable appearance of the prior Pancoast tumor and related bony findings in the right second rib and right T1 and T2 vertebra compatible with successfully treated tumor. No significant enlargement or new lesions are identified. Similarly the treated right adrenal metastatic lesion is stable in appearance. 2. Upper normal size right hilar lymph node may warrant surveillance. Currently 9 mm in short axis. 3. Other imaging findings of potential clinical significance: Aortic Atherosclerosis (ICD10-I70.0) and Emphysema (ICD10-J43.9). Scattered proximal sigmoid colon diverticula. Mucus plugging medially in the left upper lobe and posteriorly in the right upper lobe.      INTERVAL HISTORY:  Melissa Sullivan 63 y.o. female seen for follow-up of metastatic lung cancer and toxicity assessment prior to immunotherapy.  Denies any skin rashes, diarrhea or dry cough.  Appetite and energy levels are 75%.  She really poorly had oral surgery again recently.  She is having trouble eating due to the problems with her teeth.  We are holding her denosumab.  Denies any fevers, night sweats or weight loss.  Denies any ER visits or hospitalizations.  Back pain and leg pain are controlled well  with oxycodone as needed.  She is also taking Marinol for appetite stimulation.  REVIEW OF SYSTEMS:  Review of Systems  Musculoskeletal:       Occasional leg pains.  All other systems reviewed and are negative.    PAST MEDICAL/SURGICAL HISTORY:  Past Medical History:  Diagnosis Date  . Adrenal mass, right ([place]) 07/28/2014  . Anemia   . Bone metastases ([place]) 04/05/2016  . Cancer ([place])    lung  right  . Diabetes mellitus without complication ([place])   . GERD (gastroesophageal reflux disease)   . Hyperlipidemia   . Hypertension   . Hypothyroidism due to medication 01/30/2017  . Lung mass 07/28/2014  . Reflux    Past Surgical History:  Procedure Laterality Date  . AMPUTATION Right 02/22/2017   Procedure: PARTIAL AMPUTATION RIGHT GREAT TOE;  Surgeon: Caprice Beaver, DPM;  Location: [facility];  Service: Podiatry;  Laterality: Right;  . APPENDECTOMY    . ESOPHAGOGASTRODUODENOSCOPY N/A 12/09/2014   TML:YYTKPT esophageal stricture/mild-to-noderate erosive gastritis. negative H.pylori  . FLEXIBLE SIGMOIDOSCOPY  2011   Dr. [name]: hyperplastic polyp  . LUNG BIOPSY Right 07/2014   CT guided  . MALONEY DILATION N/A 12/09/2014   Procedure: [name] DILATION;  Surgeon: [name], MD;  Location: [facility] SUITE;  Service: Endoscopy;  Laterality: N/A;  . PORTACATH PLACEMENT Left 09/01/2014  . SAVORY DILATION N/A 12/09/2014   Procedure: SAVORY DILATION;  Surgeon: [name], MD;  Location: [facility] SU[facility];  Service: Endoscopy;  Laterality: N/A;  SOCIAL HISTORY:  Social History   Socioeconomic History  . Marital status: Married    Spouse name: Not on file  . Number of children: Not on file  . Years of education: Not on file  . Highest education level: Not on file  Occupational History  . Not on file  Tobacco Use  . Smoking status: Light Tobacco Smoker    Packs/day: 0.30    Years: 20.00    Pack years: 6.00    Types: Cigarettes  . Smokeless tobacco: Never Used  Substance and Sexual  Activity  . Alcohol use: No  . Drug use: No  . Sexual activity: Not on file  Other Topics Concern  . Not on file  Social History Narrative  . Not on file   Social Determinants of Health   Financial Resource Strain:   . Difficulty of Paying Living Expenses: Not on file  Food Insecurity:   . Worried About Charity fundraiser in the Last Year: Not on file  . Ran Out of Food in the Last Year: Not on file  Transportation Needs:   . Lack of Transportation (Medical): Not on file  . Lack of Transportation (Non-Medical): Not on file  Physical Activity:   . Days of Exercise per Week: Not on file  . Minutes of Exercise per Session: Not on file  Stress:   . Feeling of Stress : Not on file  Social Connections:   . Frequency of Communication with Friends and Family: Not on file  . Frequency of Social Gatherings with Friends and Family: Not on file  . Attends Religious Services: Not on file  . Active Member of Clubs or Organizations: Not on file  . Attends Archivist Meetings: Not on file  . Marital Status: Not on file  Intimate Partner Violence:   . Fear of Current or Ex-Partner: Not on file  . Emotionally Abused: Not on file  . Physically Abused: Not on file  . Sexually Abused: Not on file    FAMILY HISTORY:  Family History  Problem Relation Age of Onset  . Cancer Sister   . Colon cancer Neg Hx     CURRENT MEDICATIONS:  Outpatient Encounter Medications as of 11/07/2019  Medication Sig  . Calcium Carb-Cholecalciferol (CALCIUM 1000 + D PO) Take 1 tablet by mouth daily.  Marland Kitchen dronabinol (MARINOL) 2.5 MG capsule Take 1 capsule (2.5 mg total) by mouth 2 (two) times daily before lunch and supper.  Marland Kitchen ENSURE (ENSURE) Take 1 Can by mouth 4 (four) times daily.  . Fluticasone-Salmeterol (ADVAIR DISKUS) 500-50 MCG/DOSE AEPB Inhale 1 puff into the lungs 2 (two) times daily. (Patient taking differently: Inhale 1 puff into the lungs 2 (two) times daily as needed (for respiratory  issues.). )  . levocetirizine (XYZAL) 5 MG tablet TAKE 1 TABLET BY MOUTH AT BEDTIME FOR ALLERGIES COUGH  . lidocaine-prilocaine (EMLA) cream APPLY A QUARTER SIZE AMOUNT TO PORT SITE 1 HOUR PRIOR TO CHEMO.  DO NOT RUB IN.  COVER WITH PLASTIC WRAP  . Nivolumab (OPDIVO IV) Inject into the vein every 28 (twenty-eight) days.  Marland Kitchen omeprazole (PRILOSEC) 40 MG capsule Take 1 capsule (40 mg total) by mouth daily. TAKE 30 MINS PRIOR TO YOUR FIRST MEAL.  . [DISCONTINUED] levothyroxine (SYNTHROID) 25 MCG tablet TAKE 1/2 (ONE-HALF) TABLET BY MOUTH ONCE DAILY BEFORE BREAKFAST  . amoxicillin (AMOXIL) 500 MG capsule Take 500 mg by mouth as needed.   . chlorhexidine (PERIDEX) 0.12 % solution Use as directed  5 mLs in the mouth or throat as needed.   Marland Kitchen ibuprofen (ADVIL,MOTRIN) 200 MG tablet Take 400 mg by mouth every 8 (eight) hours as needed (for pain.).  Marland Kitchen magic mouthwash SOLN Take 5 mLs by mouth 4 (four) times daily as needed for mouth pain. (Patient not taking: Reported on 11/07/2019)  . ondansetron (ZOFRAN) 8 MG tablet Take 1 tablet (8 mg total) by mouth every 8 (eight) hours as needed for nausea or vomiting. (Patient not taking: Reported on 11/07/2019)  . Oxycodone HCl 10 MG TABS Take 1-2 tablets (10-20 mg total) by mouth every 12 (twelve) hours as needed (pain.). (Patient not taking: Reported on 11/07/2019)   Facility-Administered Encounter Medications as of 11/07/2019  Medication  . sodium chloride flush (NS) 0.9 % injection 10 mL    ALLERGIES:  Allergies  Allergen Reactions  . Xgeva [Denosumab]     osteonecrosis     PHYSICAL EXAM:  ECOG Performance status: 1  Vitals:   11/07/19 0916  BP: (!) 109/58  Pulse: 89  Resp: 18  Temp: (!) 97.1 F (36.2 C)  SpO2: 100%   Filed Weights   11/07/19 0916  Weight: 104 lb (47.2 kg)    Physical Exam Constitutional:      Appearance: Normal appearance. She is normal weight.  Cardiovascular:     Rate and Rhythm: Normal rate and regular rhythm.      Heart sounds: Normal heart sounds.  Pulmonary:     Effort: Pulmonary effort is normal.     Breath sounds: Normal breath sounds.  Abdominal:     General: There is no distension.     Palpations: Abdomen is soft. There is no mass.  Musculoskeletal:        General: Normal range of motion.  Skin:    General: Skin is warm and dry.  Neurological:     Mental Status: She is alert and oriented to person, place, and time. Mental status is at baseline.  Psychiatric:        Mood and Affect: Mood normal.        Behavior: Behavior normal.      LABORATORY DATA:  I have reviewed the labs as listed.  CBC    Component Value Date/Time   WBC 8.2 11/07/2019 0846   RBC 4.43 11/07/2019 0846   HGB 13.7 11/07/2019 0846   HCT 42.7 11/07/2019 0846   PLT 293 11/07/2019 0846   MCV 96.4 11/07/2019 0846   MCH 30.9 11/07/2019 0846   MCHC 32.1 11/07/2019 0846   RDW 14.6 11/07/2019 0846   LYMPHSABS 2.7 11/07/2019 0846   MONOABS 0.7 11/07/2019 0846   EOSABS 0.2 11/07/2019 0846   BASOSABS 0.0 11/07/2019 0846   CMP Latest Ref Rng & Units 11/07/2019 10/10/2019 09/12/2019  Glucose 70 - 99 mg/dL 103(H) 84 85  BUN 8 - 23 mg/dL 11 14 15   Creatinine 0.44 - 1.00 mg/dL 1.27(H) 1.22(H) 1.39(H)  Sodium 135 - 145 mmol/L 140 139 140  Potassium 3.5 - 5.1 mmol/L 3.7 4.2 3.8  Chloride 98 - 111 mmol/L 104 102 103  CO2 22 - 32 mmol/L 27 28 28   Calcium 8.9 - 10.3 mg/dL 10.1 10.4(H) 9.9  Total Protein 6.5 - 8.1 g/dL 7.5 8.1 7.4  Total Bilirubin 0.3 - 1.2 mg/dL 0.4 0.7 0.5  Alkaline Phos 38 - 126 U/L 66 71 66  AST 15 - 41 U/L 17 19 18   ALT 0 - 44 U/L 12 16 14        DIAGNOSTIC  IMAGING:  I have independently reviewed the scans and discussed with the patient.     ASSESSMENT & PLAN:   Cancer of upper lobe of right lung 1.  Metastatic adenocarcinoma of the lung to the bones and right adrenal: -Foundation 1 testing shows 14 genomic alterations, no approved FDA therapies. -Opdivo 400 mg monthly started on  02/25/2015. -CT CAP from 10/03/2019 showed stable posttreatment changes in the right lung apex with no evidence of metastatic disease.  4 mm lung nodule in the right upper lobe along the major fissure.  Stable partially calcified right adrenal lesion.  Unchanged sclerotic lesion at T2 vertebral body.  -She does not have any immunotherapy related side effects.  We have reviewed her labs which are grossly unchanged. -She may proceed with her immunotherapy today.  I plan to see her back in 6 weeks for follow-up.  I plan to repeat CT scan in 6 months after the last scan.  2.  Back pain and leg pain: -She is taking oxycodone 1 tablet every other day as needed.  3.  Appetite: -She is continuing Marinol.  4.  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  We are holding it indefinitely. -She had recurrent surgery 4 weeks ago.  She will have another surgery on 01/02/2020. Total time spent is 25 minutes with more than 50% of the time spent face-to-face discussing scan results, treatment plan, counseling and coordination of care.  Orders placed this encounter:  No orders of the defined types were placed in this encounter.     Derek Jack, MD [place] [phone]

## 2019-11-07 NOTE — Progress Notes (Signed)
Patient seen by Dr. Delton Coombes with lab review and ok to treat today verbal order.    Patient tolerated therapy with no complaints voiced.  Port site clean and dry with no bruising or swelling noted at site.  Good blood return noted before and after administration of therapy.  Band aid applied.  Patient left ambulatory with VSS and no s/s of distress noted.

## 2019-11-07 NOTE — Patient Instructions (Signed)
[place] at [facility] Discharge Instructions  You were seen today by Dr. Delton Coombes. He went over your recent lab results. Continue treatment every 3 weeks. He will see you back in 6 weeks for labs, treatment and follow up.   Thank you for choosing [place] at [facility] to provide your oncology and hematology care.  To afford each patient quality time with our provider, please arrive at least 15 minutes before your scheduled appointment time.   If you have a lab appointment with the [facility] please come in thru the  Main Entrance and check in at the main information desk  You need to re-schedule your appointment should you arrive 10 or more minutes late.  We strive to give you quality time with our providers, and arriving late affects you and other patients whose appointments are after yours.  Also, if you no show three or more times for appointments you may be dismissed from the clinic at the providers discretion.     Again, thank you for choosing [facility].  Our hope is that these requests will decrease the amount of time that you wait before being seen by our physicians.       _____________________________________________________________  Should you have questions after your visit to [facility], please contact our office at [phone] between the hours of 8:00 a.m. and 4:30 p.m.  Voicemails left after 4:00 p.m. will not be returned until the following business day.  For prescription refill requests, have your pharmacy contact our office and allow 72 hours.    Cancer Center Support Programs:   > Cancer Support Group  2nd Tuesday of the month 1pm-2pm, Journey Room

## 2019-11-08 ENCOUNTER — Other Ambulatory Visit (HOSPITAL_COMMUNITY): Payer: Self-pay | Admitting: Nurse Practitioner

## 2019-11-08 DIAGNOSIS — E032 Hypothyroidism due to medicaments and other exogenous substances: Secondary | ICD-10-CM

## 2019-11-24 NOTE — Assessment & Plan Note (Signed)
1.  Metastatic adenocarcinoma of the lung to the bones and right adrenal: -Foundation 1 testing shows 14 genomic alterations, no approved FDA therapies. -Opdivo 400 mg monthly started on 02/25/2015. -CT CAP from 10/03/2019 showed stable posttreatment changes in the right lung apex with no evidence of metastatic disease.  4 mm lung nodule in the right upper lobe along the major fissure.  Stable partially calcified right adrenal lesion.  Unchanged sclerotic lesion at T2 vertebral body.  -She does not have any immunotherapy related side effects.  We have reviewed her labs which are grossly unchanged. -She may proceed with her immunotherapy today.  I plan to see her back in 6 weeks for follow-up.  I plan to repeat CT scan in 6 months after the last scan.  2.  Back pain and leg pain: -She is taking oxycodone 1 tablet every other day as needed.  3.  Appetite: -She is continuing Marinol.  4.  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  We are holding it indefinitely. -She had recurrent surgery 4 weeks ago.  She will have another surgery on 01/02/2020.

## 2019-11-28 ENCOUNTER — Other Ambulatory Visit (HOSPITAL_COMMUNITY): Payer: Medicare HMO

## 2019-11-28 ENCOUNTER — Inpatient Hospital Stay (HOSPITAL_COMMUNITY): Payer: Medicare HMO

## 2019-12-05 ENCOUNTER — Other Ambulatory Visit (HOSPITAL_COMMUNITY): Payer: Medicare HMO

## 2019-12-05 ENCOUNTER — Inpatient Hospital Stay (HOSPITAL_COMMUNITY): Payer: Medicare HMO

## 2019-12-06 ENCOUNTER — Inpatient Hospital Stay (HOSPITAL_COMMUNITY): Payer: Medicare HMO | Attending: Hematology

## 2019-12-06 ENCOUNTER — Inpatient Hospital Stay (HOSPITAL_COMMUNITY): Payer: Medicare HMO

## 2019-12-06 ENCOUNTER — Other Ambulatory Visit: Payer: Self-pay

## 2019-12-06 VITALS — BP 117/69 | HR 98 | Temp 97.1°F | Resp 18 | Wt 101.7 lb

## 2019-12-06 DIAGNOSIS — Z5112 Encounter for antineoplastic immunotherapy: Secondary | ICD-10-CM | POA: Insufficient documentation

## 2019-12-06 DIAGNOSIS — C3411 Malignant neoplasm of upper lobe, right bronchus or lung: Secondary | ICD-10-CM

## 2019-12-06 DIAGNOSIS — E278 Other specified disorders of adrenal gland: Secondary | ICD-10-CM

## 2019-12-06 LAB — COMPREHENSIVE METABOLIC PANEL
ALT: 12 U/L (ref 0–44)
AST: 16 U/L (ref 15–41)
Albumin: 3.9 g/dL (ref 3.5–5.0)
Alkaline Phosphatase: 79 U/L (ref 38–126)
Anion gap: 8 (ref 5–15)
BUN: 14 mg/dL (ref 8–23)
CO2: 25 mmol/L (ref 22–32)
Calcium: 9.9 mg/dL (ref 8.9–10.3)
Chloride: 106 mmol/L (ref 98–111)
Creatinine, Ser: 1.15 mg/dL — ABNORMAL HIGH (ref 0.44–1.00)
GFR calc Af Amer: 59 mL/min — ABNORMAL LOW (ref >60)
GFR calc non Af Amer: 51 mL/min — ABNORMAL LOW (ref >60)
Glucose, Bld: 87 mg/dL (ref 70–99)
Potassium: 4 mmol/L (ref 3.5–5.1)
Sodium: 139 mmol/L (ref 135–145)
Total Bilirubin: 0.3 mg/dL (ref 0.3–1.2)
Total Protein: 8 g/dL (ref 6.5–8.1)

## 2019-12-06 LAB — CBC WITH DIFFERENTIAL/PLATELET
Abs Immature Granulocytes: 0.07 10*3/uL (ref 0.00–0.07)
Basophils Absolute: 0 10*3/uL (ref 0.0–0.1)
Basophils Relative: 1 %
Eosinophils Absolute: 0.2 10*3/uL (ref 0.0–0.5)
Eosinophils Relative: 3 %
HCT: 43.5 % (ref 36.0–46.0)
Hemoglobin: 13.9 g/dL (ref 12.0–15.0)
Immature Granulocytes: 1 %
Lymphocytes Relative: 30 %
Lymphs Abs: 2.6 10*3/uL (ref 0.7–4.0)
MCH: 30.6 pg (ref 26.0–34.0)
MCHC: 32 g/dL (ref 30.0–36.0)
MCV: 95.8 fL (ref 80.0–100.0)
Monocytes Absolute: 0.7 10*3/uL (ref 0.1–1.0)
Monocytes Relative: 8 %
Neutro Abs: 5 10*3/uL (ref 1.7–7.7)
Neutrophils Relative %: 57 %
Platelets: 328 10*3/uL (ref 150–400)
RBC: 4.54 MIL/uL (ref 3.87–5.11)
RDW: 14.3 % (ref 11.5–15.5)
WBC: 8.6 10*3/uL (ref 4.0–10.5)
nRBC: 0 % (ref 0.0–0.2)

## 2019-12-06 MED ORDER — SODIUM CHLORIDE 0.9% FLUSH
10.0000 mL | INTRAVENOUS | Status: DC | PRN
  Administered 2019-12-06: 10:00:00 10 mL

## 2019-12-06 MED ORDER — HEPARIN SOD (PORK) LOCK FLUSH 100 UNIT/ML IV SOLN
500.0000 [IU] | Freq: Once | INTRAVENOUS | Status: AC | PRN
  Administered 2019-12-06: 11:00:00 500 [IU]

## 2019-12-06 MED ORDER — SODIUM CHLORIDE 0.9 % IV SOLN
480.0000 mg | Freq: Once | INTRAVENOUS | Status: AC
  Administered 2019-12-06: 10:00:00 480 mg via INTRAVENOUS
  Filled 2019-12-06: qty 48

## 2019-12-06 MED ORDER — SODIUM CHLORIDE 0.9 % IV SOLN: Freq: Once | INTRAVENOUS | Status: AC

## 2019-12-06 NOTE — Progress Notes (Signed)
Labs meet parameters today for treatment. Proceed per protocol.   Treatment given per orders. Patient tolerated it well without problems. Vitals stable and discharged home from clinic ambulatory. Follow up as scheduled.

## 2019-12-11 DIAGNOSIS — E1142 Type 2 diabetes mellitus with diabetic polyneuropathy: Secondary | ICD-10-CM | POA: Diagnosis not present

## 2019-12-11 DIAGNOSIS — I700 Atherosclerosis of aorta: Secondary | ICD-10-CM | POA: Diagnosis not present

## 2019-12-11 DIAGNOSIS — Z008 Encounter for other general examination: Secondary | ICD-10-CM | POA: Diagnosis not present

## 2019-12-11 DIAGNOSIS — J449 Chronic obstructive pulmonary disease, unspecified: Secondary | ICD-10-CM | POA: Diagnosis not present

## 2019-12-11 DIAGNOSIS — E11630 Type 2 diabetes mellitus with periodontal disease: Secondary | ICD-10-CM | POA: Diagnosis not present

## 2019-12-11 DIAGNOSIS — G8929 Other chronic pain: Secondary | ICD-10-CM | POA: Diagnosis not present

## 2019-12-11 DIAGNOSIS — E039 Hypothyroidism, unspecified: Secondary | ICD-10-CM | POA: Diagnosis not present

## 2019-12-11 DIAGNOSIS — R69 Illness, unspecified: Secondary | ICD-10-CM | POA: Diagnosis not present

## 2019-12-11 DIAGNOSIS — E1122 Type 2 diabetes mellitus with diabetic chronic kidney disease: Secondary | ICD-10-CM | POA: Diagnosis not present

## 2019-12-11 DIAGNOSIS — E1151 Type 2 diabetes mellitus with diabetic peripheral angiopathy without gangrene: Secondary | ICD-10-CM | POA: Diagnosis not present

## 2019-12-11 DIAGNOSIS — C3490 Malignant neoplasm of unspecified part of unspecified bronchus or lung: Secondary | ICD-10-CM | POA: Diagnosis not present

## 2019-12-18 DIAGNOSIS — M87180 Osteonecrosis due to drugs, jaw: Secondary | ICD-10-CM | POA: Diagnosis not present

## 2019-12-18 DIAGNOSIS — E440 Moderate protein-calorie malnutrition: Secondary | ICD-10-CM | POA: Diagnosis not present

## 2019-12-18 DIAGNOSIS — R634 Abnormal weight loss: Secondary | ICD-10-CM | POA: Diagnosis not present

## 2019-12-18 DIAGNOSIS — M546 Pain in thoracic spine: Secondary | ICD-10-CM | POA: Diagnosis not present

## 2019-12-18 DIAGNOSIS — C3490 Malignant neoplasm of unspecified part of unspecified bronchus or lung: Secondary | ICD-10-CM | POA: Diagnosis not present

## 2019-12-18 DIAGNOSIS — C799 Secondary malignant neoplasm of unspecified site: Secondary | ICD-10-CM | POA: Diagnosis not present

## 2019-12-19 ENCOUNTER — Ambulatory Visit (HOSPITAL_COMMUNITY): Payer: Medicare HMO | Admitting: Hematology

## 2019-12-19 ENCOUNTER — Other Ambulatory Visit: Payer: Self-pay | Admitting: Internal Medicine

## 2019-12-19 ENCOUNTER — Other Ambulatory Visit (HOSPITAL_COMMUNITY): Payer: Self-pay | Admitting: Internal Medicine

## 2019-12-19 ENCOUNTER — Inpatient Hospital Stay (HOSPITAL_COMMUNITY): Payer: Medicare HMO

## 2019-12-19 ENCOUNTER — Other Ambulatory Visit (HOSPITAL_COMMUNITY): Payer: Medicare HMO

## 2019-12-19 DIAGNOSIS — I739 Peripheral vascular disease, unspecified: Secondary | ICD-10-CM

## 2019-12-24 ENCOUNTER — Ambulatory Visit (HOSPITAL_COMMUNITY)
Admission: RE | Admit: 2019-12-24 | Discharge: 2019-12-24 | Disposition: A | Discharge status: Home / Self Care | Payer: Medicare HMO | Source: Ambulatory Visit | Attending: Internal Medicine | Admitting: Internal Medicine

## 2019-12-24 ENCOUNTER — Other Ambulatory Visit: Payer: Self-pay

## 2019-12-24 DIAGNOSIS — I739 Peripheral vascular disease, unspecified: Secondary | ICD-10-CM

## 2019-12-24 DIAGNOSIS — Z8679 Personal history of other diseases of the circulatory system: Secondary | ICD-10-CM | POA: Diagnosis not present

## 2019-12-26 ENCOUNTER — Other Ambulatory Visit: Payer: Self-pay | Admitting: Gastroenterology

## 2020-01-01 ENCOUNTER — Encounter (HOSPITAL_COMMUNITY): Payer: Self-pay | Admitting: Hematology

## 2020-01-01 ENCOUNTER — Inpatient Hospital Stay (HOSPITAL_COMMUNITY): Payer: Medicare HMO | Attending: Hematology | Admitting: Hematology

## 2020-01-01 DIAGNOSIS — M79606 Pain in leg, unspecified: Secondary | ICD-10-CM

## 2020-01-01 DIAGNOSIS — M549 Dorsalgia, unspecified: Secondary | ICD-10-CM | POA: Diagnosis not present

## 2020-01-01 DIAGNOSIS — E278 Other specified disorders of adrenal gland: Secondary | ICD-10-CM

## 2020-01-01 DIAGNOSIS — C7971 Secondary malignant neoplasm of right adrenal gland: Secondary | ICD-10-CM | POA: Diagnosis not present

## 2020-01-01 DIAGNOSIS — C3490 Malignant neoplasm of unspecified part of unspecified bronchus or lung: Secondary | ICD-10-CM

## 2020-01-01 DIAGNOSIS — Z5112 Encounter for antineoplastic immunotherapy: Secondary | ICD-10-CM | POA: Insufficient documentation

## 2020-01-01 DIAGNOSIS — C7951 Secondary malignant neoplasm of bone: Secondary | ICD-10-CM

## 2020-01-01 DIAGNOSIS — C3411 Malignant neoplasm of upper lobe, right bronchus or lung: Secondary | ICD-10-CM | POA: Insufficient documentation

## 2020-01-01 NOTE — Progress Notes (Signed)
Virtual Visit via Telephone Note  I connected with Melissa Sullivan on 01/01/20 at  3:05 PM EST by telephone and verified that I am speaking with the correct person using two identifiers.   I discussed the limitations, risks, security and privacy concerns of performing an evaluation and management service by telephone and the availability of in person appointments. I also discussed with the patient that there may be a patient responsible charge related to this service. The patient expressed understanding and agreed to proceed.   History of Present Illness: I see her for metastatic adenocarcinoma of the lung to the bones in the right adrenal.  She has been on Opdivo and has been tolerating it well.   Observations/Objective: She reported that she had leg pains.  Dr. Nevada Crane has ordered a screening ultrasound of the arteries of the lower extremities.  She reportedly takes oxycodone once every other day.  She denies any immunotherapy related side effects including diarrhea, skin rashes, dry cough or shortness of breath.  Does not report any new onset pains.  Assessment and Plan:  1.  Metastatic adenocarcinoma of the lung to the bones in the right internal: -Opdivo 400 mg monthly being tolerated very well. -Last CT scan on 10/03/2019 showed stable posttreatment changes in the right lobe apex with no evidence of metastatic disease.  4 mm lung nodule in the right upper lobe along the major fissure.  Stable partially calcified right adrenal lesion.  Unchanged sclerotic lesion at T2 vertebral body. -I have reviewed labs from 12/06/2019 which are grossly within normal limits. -Her last TSH was also normal at 1.916. -I will schedule her for 73-month follow-up with CT CAP.  2.  Back pain and leg pain: -She will continue oxycodone 1 tablet every other day as needed.  3.  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  We are holding it indefinitely. -She will see  neurosurgery on 01/15/2020.   Follow Up Instructions:    I discussed the assessment and treatment plan with the patient. The patient was provided an opportunity to ask questions and all were answered. The patient agreed with the plan and demonstrated an understanding of the instructions.   The patient was advised to call back or seek an in-person evaluation if the symptoms worsen or if the condition fails to improve as anticipated.  I provided 12 minutes of non-face-to-face time during this encounter.   Derek Jack, MD

## 2020-01-02 ENCOUNTER — Other Ambulatory Visit (HOSPITAL_COMMUNITY): Payer: Medicare HMO

## 2020-01-02 ENCOUNTER — Inpatient Hospital Stay (HOSPITAL_COMMUNITY): Payer: Medicare HMO

## 2020-01-02 ENCOUNTER — Ambulatory Visit (HOSPITAL_COMMUNITY): Payer: Medicare HMO | Admitting: Hematology

## 2020-01-02 NOTE — Addendum Note (Signed)
Addended by: Farley Ly on: 01/02/2020 05:27 PM   Modules accepted: Orders

## 2020-01-03 ENCOUNTER — Inpatient Hospital Stay (HOSPITAL_COMMUNITY): Payer: Medicare HMO

## 2020-01-03 ENCOUNTER — Other Ambulatory Visit: Payer: Self-pay

## 2020-01-03 ENCOUNTER — Encounter (HOSPITAL_COMMUNITY): Payer: Self-pay

## 2020-01-03 VITALS — BP 124/79 | HR 74 | Temp 96.8°F | Resp 18 | Wt 102.2 lb

## 2020-01-03 DIAGNOSIS — C3411 Malignant neoplasm of upper lobe, right bronchus or lung: Secondary | ICD-10-CM

## 2020-01-03 DIAGNOSIS — E278 Other specified disorders of adrenal gland: Secondary | ICD-10-CM | POA: Diagnosis not present

## 2020-01-03 DIAGNOSIS — Z5112 Encounter for antineoplastic immunotherapy: Secondary | ICD-10-CM | POA: Diagnosis present

## 2020-01-03 LAB — CBC WITH DIFFERENTIAL/PLATELET
Abs Immature Granulocytes: 0.03 10*3/uL (ref 0.00–0.07)
Basophils Absolute: 0 10*3/uL (ref 0.0–0.1)
Basophils Relative: 1 %
Eosinophils Absolute: 0.2 10*3/uL (ref 0.0–0.5)
Eosinophils Relative: 2 %
HCT: 44.3 % (ref 36.0–46.0)
Hemoglobin: 14.2 g/dL (ref 12.0–15.0)
Immature Granulocytes: 0 %
Lymphocytes Relative: 33 %
Lymphs Abs: 2.9 10*3/uL (ref 0.7–4.0)
MCH: 30.6 pg (ref 26.0–34.0)
MCHC: 32.1 g/dL (ref 30.0–36.0)
MCV: 95.5 fL (ref 80.0–100.0)
Monocytes Absolute: 0.7 10*3/uL (ref 0.1–1.0)
Monocytes Relative: 8 %
Neutro Abs: 4.9 10*3/uL (ref 1.7–7.7)
Neutrophils Relative %: 56 %
Platelets: 288 10*3/uL (ref 150–400)
RBC: 4.64 MIL/uL (ref 3.87–5.11)
RDW: 14.7 % (ref 11.5–15.5)
WBC: 8.8 10*3/uL (ref 4.0–10.5)
nRBC: 0 % (ref 0.0–0.2)

## 2020-01-03 LAB — COMPREHENSIVE METABOLIC PANEL
ALT: 13 U/L (ref 0–44)
AST: 18 U/L (ref 15–41)
Albumin: 4.2 g/dL (ref 3.5–5.0)
Alkaline Phosphatase: 66 U/L (ref 38–126)
Anion gap: 7 (ref 5–15)
BUN: 12 mg/dL (ref 8–23)
CO2: 26 mmol/L (ref 22–32)
Calcium: 9.9 mg/dL (ref 8.9–10.3)
Chloride: 104 mmol/L (ref 98–111)
Creatinine, Ser: 1.24 mg/dL — ABNORMAL HIGH (ref 0.44–1.00)
GFR calc Af Amer: 54 mL/min — ABNORMAL LOW (ref >60)
GFR calc non Af Amer: 46 mL/min — ABNORMAL LOW (ref >60)
Glucose, Bld: 97 mg/dL (ref 70–99)
Potassium: 3.9 mmol/L (ref 3.5–5.1)
Sodium: 137 mmol/L (ref 135–145)
Total Bilirubin: 0.4 mg/dL (ref 0.3–1.2)
Total Protein: 7.9 g/dL (ref 6.5–8.1)

## 2020-01-03 MED ORDER — SODIUM CHLORIDE 0.9 % IV SOLN: Freq: Once | INTRAVENOUS | Status: AC

## 2020-01-03 MED ORDER — SODIUM CHLORIDE 0.9% FLUSH
10.0000 mL | INTRAVENOUS | Status: DC | PRN
  Administered 2020-01-03: 10 mL

## 2020-01-03 MED ORDER — HEPARIN SOD (PORK) LOCK FLUSH 100 UNIT/ML IV SOLN
500.0000 [IU] | Freq: Once | INTRAVENOUS | Status: AC | PRN
  Administered 2020-01-03: 500 [IU]

## 2020-01-03 MED ORDER — SODIUM CHLORIDE 0.9 % IV SOLN
480.0000 mg | Freq: Once | INTRAVENOUS | Status: AC
  Administered 2020-01-03: 480 mg via INTRAVENOUS
  Filled 2020-01-03: qty 48

## 2020-01-03 NOTE — Progress Notes (Signed)
Labs reviewed today. They meet parameters for treatment. Proceed per protocol. No new complaints or concerns from patient today.   Treatment given per orders. Patient tolerated it well without problems. Vitals stable and discharged home from clinic ambulatory. Follow up as scheduled.

## 2020-01-10 DIAGNOSIS — B351 Tinea unguium: Secondary | ICD-10-CM | POA: Diagnosis not present

## 2020-01-10 DIAGNOSIS — C3490 Malignant neoplasm of unspecified part of unspecified bronchus or lung: Secondary | ICD-10-CM | POA: Diagnosis not present

## 2020-01-10 DIAGNOSIS — G629 Polyneuropathy, unspecified: Secondary | ICD-10-CM | POA: Diagnosis not present

## 2020-01-31 ENCOUNTER — Inpatient Hospital Stay (HOSPITAL_COMMUNITY): Payer: Medicare HMO

## 2020-01-31 ENCOUNTER — Inpatient Hospital Stay (HOSPITAL_COMMUNITY): Payer: Medicare HMO | Attending: Hematology

## 2020-01-31 ENCOUNTER — Other Ambulatory Visit: Payer: Self-pay

## 2020-01-31 ENCOUNTER — Encounter (HOSPITAL_COMMUNITY): Payer: Self-pay

## 2020-01-31 VITALS — BP 116/73 | HR 88 | Temp 97.9°F | Resp 18 | Wt 98.6 lb

## 2020-01-31 DIAGNOSIS — Z5112 Encounter for antineoplastic immunotherapy: Secondary | ICD-10-CM | POA: Insufficient documentation

## 2020-01-31 DIAGNOSIS — C3411 Malignant neoplasm of upper lobe, right bronchus or lung: Secondary | ICD-10-CM | POA: Diagnosis not present

## 2020-01-31 DIAGNOSIS — E278 Other specified disorders of adrenal gland: Secondary | ICD-10-CM | POA: Insufficient documentation

## 2020-01-31 DIAGNOSIS — E039 Hypothyroidism, unspecified: Secondary | ICD-10-CM | POA: Insufficient documentation

## 2020-01-31 LAB — COMPREHENSIVE METABOLIC PANEL
ALT: 11 U/L (ref 0–44)
AST: 16 U/L (ref 15–41)
Albumin: 4.1 g/dL (ref 3.5–5.0)
Alkaline Phosphatase: 65 U/L (ref 38–126)
Anion gap: 8 (ref 5–15)
BUN: 9 mg/dL (ref 8–23)
CO2: 26 mmol/L (ref 22–32)
Calcium: 9.5 mg/dL (ref 8.9–10.3)
Chloride: 104 mmol/L (ref 98–111)
Creatinine, Ser: 1.21 mg/dL — ABNORMAL HIGH (ref 0.44–1.00)
GFR calc Af Amer: 55 mL/min — ABNORMAL LOW (ref >60)
GFR calc non Af Amer: 48 mL/min — ABNORMAL LOW (ref >60)
Glucose, Bld: 113 mg/dL — ABNORMAL HIGH (ref 70–99)
Potassium: 4.3 mmol/L (ref 3.5–5.1)
Sodium: 138 mmol/L (ref 135–145)
Total Bilirubin: 0.5 mg/dL (ref 0.3–1.2)
Total Protein: 7.8 g/dL (ref 6.5–8.1)

## 2020-01-31 LAB — CBC WITH DIFFERENTIAL/PLATELET
Abs Immature Granulocytes: 0.01 10*3/uL (ref 0.00–0.07)
Basophils Absolute: 0.1 10*3/uL (ref 0.0–0.1)
Basophils Relative: 1 %
Eosinophils Absolute: 0.2 10*3/uL (ref 0.0–0.5)
Eosinophils Relative: 3 %
HCT: 44.2 % (ref 36.0–46.0)
Hemoglobin: 14.2 g/dL (ref 12.0–15.0)
Immature Granulocytes: 0 %
Lymphocytes Relative: 30 %
Lymphs Abs: 2.2 10*3/uL (ref 0.7–4.0)
MCH: 30.5 pg (ref 26.0–34.0)
MCHC: 32.1 g/dL (ref 30.0–36.0)
MCV: 95.1 fL (ref 80.0–100.0)
Monocytes Absolute: 0.5 10*3/uL (ref 0.1–1.0)
Monocytes Relative: 7 %
Neutro Abs: 4.3 10*3/uL (ref 1.7–7.7)
Neutrophils Relative %: 59 %
Platelets: 296 10*3/uL (ref 150–400)
RBC: 4.65 MIL/uL (ref 3.87–5.11)
RDW: 14.6 % (ref 11.5–15.5)
WBC: 7.3 10*3/uL (ref 4.0–10.5)
nRBC: 0 % (ref 0.0–0.2)

## 2020-01-31 LAB — TSH: TSH: 2.23 u[IU]/mL (ref 0.350–4.500)

## 2020-01-31 MED ORDER — SODIUM CHLORIDE 0.9% FLUSH
10.0000 mL | INTRAVENOUS | Status: DC | PRN
  Administered 2020-01-31: 10 mL

## 2020-01-31 MED ORDER — SODIUM CHLORIDE 0.9 % IV SOLN
480.0000 mg | Freq: Once | INTRAVENOUS | Status: AC
  Administered 2020-01-31: 480 mg via INTRAVENOUS
  Filled 2020-01-31: qty 48

## 2020-01-31 MED ORDER — SODIUM CHLORIDE 0.9 % IV SOLN: Freq: Once | INTRAVENOUS | Status: AC

## 2020-01-31 MED ORDER — HEPARIN SOD (PORK) LOCK FLUSH 100 UNIT/ML IV SOLN
500.0000 [IU] | Freq: Once | INTRAVENOUS | Status: AC | PRN
  Administered 2020-01-31: 500 [IU]

## 2020-01-31 NOTE — Progress Notes (Signed)
Patient tolerated therapy with no complaints voiced.  Port site clean and dry with no bruising or swelling noted at site.  No blood return noted before and after administration of therapy.  Denied pain with flush.  Band aid applied.  Patient left ambulatory with VSS and no s/s of distress noted.

## 2020-02-10 ENCOUNTER — Other Ambulatory Visit (HOSPITAL_COMMUNITY): Payer: Self-pay | Admitting: Nurse Practitioner

## 2020-02-10 DIAGNOSIS — E032 Hypothyroidism due to medicaments and other exogenous substances: Secondary | ICD-10-CM

## 2020-02-20 ENCOUNTER — Ambulatory Visit (INDEPENDENT_AMBULATORY_CARE_PROVIDER_SITE_OTHER): Payer: Medicare HMO | Admitting: Gastroenterology

## 2020-02-20 ENCOUNTER — Encounter: Payer: Self-pay | Admitting: Gastroenterology

## 2020-02-20 ENCOUNTER — Other Ambulatory Visit: Payer: Self-pay

## 2020-02-20 DIAGNOSIS — K219 Gastro-esophageal reflux disease without esophagitis: Secondary | ICD-10-CM

## 2020-02-20 MED ORDER — DEXILANT 60 MG PO CPDR: DELAYED_RELEASE_CAPSULE | ORAL | 11 refills | Status: DC

## 2020-02-20 NOTE — Progress Notes (Signed)
Cc'ed to pcp °

## 2020-02-20 NOTE — Assessment & Plan Note (Signed)
SYMPTOMS NOT IDEALLY CONTROLLED OFF PPI.  DRINK WATER TO KEEP YOUR URINE LIGHT YELLOW. AVOID REFLUX TRIGGERS.  HANDOUT GIVEN. START DEXILANT. PLEASE CALL WITH QUESTIONS OR CONCERNS. FOLLOW UP IN 6 MOS

## 2020-02-20 NOTE — Progress Notes (Signed)
Subjective:    Patient ID: Melissa Sullivan, female    DOB: Aug 10, 1956, 64 y.o.   MRN: [id number]  [name], MD  HPI Omeprazole made legs heart. Heartburn came back.LOST 8 LBS DUE TO WORK ON HER MOUTH AND HEARTBURN. Would like to try another. Feels heartburn pretty MUCH EVERY DAY.  PT DENIES FEVER, CHILLS, HEMATOCHEZIA, HEMATEMESIS, nausea, vomiting, melena, diarrhea, CHEST PAIN, SHORTNESS OF BREATH,  CHANGE IN BOWEL IN HABITS, constipation, abdominal pain, or  problems swallowing.  Past Medical History:  Diagnosis Date  . Adrenal mass, right ([place]) 07/28/2014  . Anemia   . Bone metastases ([place]) 04/05/2016  . Cancer ([place])    lung  right  . Diabetes mellitus without complication ([place])   . GERD (gastroesophageal reflux disease)   . Hyperlipidemia   . Hypertension   . Hypothyroidism due to medication 01/30/2017  . Lung mass 07/28/2014  . Reflux     Past Surgical History:  Procedure Laterality Date  . AMPUTATION Right 02/22/2017   Procedure: PARTIAL AMPUTATION RIGHT GREAT TOE;  Surgeon: Caprice Beaver, DPM;  Location: [facility];  Service: Podiatry;  Laterality: Right;  . APPENDECTOMY    . ESOPHAGOGASTRODUODENOSCOPY N/A 12/09/2014   GBT:DVVOHY esophageal stricture/mild-to-noderate erosive gastritis. negative H.pylori  . FLEXIBLE SIGMOIDOSCOPY  2011   Dr. [name]: hyperplastic polyp  . LUNG BIOPSY Right 07/2014   CT guided  . MALONEY DILATION N/A 12/09/2014   Procedure: [name] DILATION;  Surgeon: [name], MD;  Location: [facility];  Service: Endoscopy;  Laterality: N/A;  . PORTACATH PLACEMENT Left 09/01/2014  . SAVORY DILATION N/A 12/09/2014   Procedure: SAVORY DILATION;  Surgeon: [name], MD;  Location: [facility];  Service: Endoscopy;  Laterality: N/A;   Allergies  Allergen Reactions  . Delton See [Denosumab]     osteonecrosis    Current Outpatient Medications  Medication Sig    . dronabinol (MARINOL) 2.5 MG capsule Take 1 capsule (2.5 mg total) by mouth 2  (two) times daily before lunch and supper.    Marland Kitchen ENSURE (ENSURE) Take 1 Can by mouth 4 (four) times daily.    . Fluticasone-Salmeterol (ADVAIR DISKUS) 500-50 MCG/DOSE AEPB Inhale 1 puff into the lungs 2 (two) times daily.    Marland Kitchen ibuprofen (ADVIL,MOTRIN) 200 MG tablet Take 400 mg by mouth every 8 (eight) hours as needed (for pain.).    Marland Kitchen levocetirizine (XYZAL) 5 MG tablet TAKE 1 TABLET BY MOUTH AT BEDTIME FOR ALLERGIES COUGH    . levothyroxine (SYNTHROID) 25 MCG tablet TAKE 1/2 (ONE-HALF) TABLET BY MOUTH ONCE DAILY BEFORE BREAKFAST    . lidocaine-prilocaine (EMLA) cream APPLY A QUARTER SIZE AMOUNT TO PORT SITE 1 HOUR PRIOR TO CHEMO.  DO NOT RUB IN.  COVER WITH PLASTIC WRAP    . magic mouthwash SOLN Take 5 mLs by mouth 4 (four) times daily as needed for mouth pain.    . Oxycodone HCl 10 MG TABS Take 1-2 tablets (10-20 mg total) by mouth every 12 (twelve) hours as needed (pain.).    Marland Kitchen simvastatin (ZOCOR) 40 MG tablet Take 40 mg by mouth daily.    Marland Kitchen amoxicillin (AMOXIL) 500 MG capsule Take 500 mg by mouth as needed.     . Nivolumab (OPDIVO IV) Inject into the vein every 28 (twenty-eight) days.    .      .       Review of Systems PER HPI OTHERWISE ALL SYSTEMS ARE NEGATIVE.    Objective:   Physical  Exam Constitutional:      General: She is not in acute distress.    Appearance: Normal appearance.  HENT:     Mouth/Throat:     Comments: MASK IN PLACE Eyes:     General: No scleral icterus.    Pupils: Pupils are equal, round, and reactive to light.  Cardiovascular:     Rate and Rhythm: Normal rate and regular rhythm.     Pulses: Normal pulses.     Heart sounds: Normal heart sounds.  Pulmonary:     Effort: Pulmonary effort is normal.     Breath sounds: Normal breath sounds.  Abdominal:     General: Bowel sounds are normal.     Palpations: Abdomen is soft.     Tenderness: There is no abdominal tenderness.  Musculoskeletal:     Cervical back: Normal range of motion.     Right lower leg: No  edema.     Left lower leg: No edema.  Lymphadenopathy:     Cervical: No cervical adenopathy.  Skin:    General: Skin is warm and dry.  Neurological:     Mental Status: She is alert and oriented to person, place, and time.     Comments: NO  NEW FOCAL DEFICITS  Psychiatric:        Mood and Affect: Mood normal.     Comments: NORMAL AFFECT       Assessment & Plan:

## 2020-02-20 NOTE — Patient Instructions (Addendum)
DRINK WATER TO KEEP YOUR URINE LIGHT YELLOW.  AVOID REFLUX TRIGGERS. SEE INFO BELOW.  START DEXILANT.   PLEASE CALL WITH QUESTIONS OR CONCERNS.  FOLLOW UP IN 6 MOS.    Lifestyle and home remedies TO CONTROL HEARTBURN You may eliminate or reduce the frequency of heartburn by making the following lifestyle changes:  . Control your weight. Being overweight is a major risk factor for heartburn and GERD. Excess pounds put pressure on your abdomen, pushing up your stomach and causing acid to back up into your esophagus.   . Eat smaller meals. 4 TO 6 MEALS A DAY. This reduces pressure on the lower esophageal sphincter, helping to prevent the valve from opening and acid from washing back into your esophagus.   Dolphus Jenny your belt. Clothes that fit tightly around your waist put pressure on your abdomen and the lower esophageal sphincter.   . Eliminate heartburn triggers. Everyone has specific triggers. Common triggers such as fatty or fried foods, spicy food, tomato sauce, carbonated beverages, alcohol, chocolate, mint, garlic, onion, caffeine and nicotine may make heartburn worse.   Marland Kitchen Avoid stooping or bending. Tying your shoes is OK. Bending over for longer periods to weed your garden isn't, especially soon after eating.   . Don't lie down after a meal. Wait at least three to four hours after eating before going to bed, and don't lie down right after eating.   Alternative medicine . Several home remedies exist for treating GERD, but they provide only temporary relief. They include drinking baking soda (sodium bicarbonate) added to water or drinking other fluids such as baking soda mixed with cream of tartar and water. . Although these liquids create temporary relief by neutralizing, washing away or buffering acids, eventually they aggravate the situation by adding gas and fluid to your stomach, increasing pressure and causing more acid reflux. Further, adding more sodium to your diet may  increase your blood pressure and add stress to your heart, and excessive bicarbonate ingestion can alter the acid-base balance in your body.

## 2020-02-24 ENCOUNTER — Other Ambulatory Visit: Payer: Self-pay

## 2020-02-24 ENCOUNTER — Ambulatory Visit (HOSPITAL_COMMUNITY)
Admission: RE | Admit: 2020-02-24 | Discharge: 2020-02-24 | Disposition: A | Discharge status: Home / Self Care | Payer: Medicare HMO | Source: Ambulatory Visit | Attending: Hematology | Admitting: Hematology

## 2020-02-24 DIAGNOSIS — C3490 Malignant neoplasm of unspecified part of unspecified bronchus or lung: Secondary | ICD-10-CM | POA: Diagnosis not present

## 2020-02-24 DIAGNOSIS — E278 Other specified disorders of adrenal gland: Secondary | ICD-10-CM | POA: Diagnosis present

## 2020-02-24 MED ORDER — IOHEXOL 300 MG/ML  SOLN
75.0000 mL | Freq: Once | INTRAMUSCULAR | Status: AC | PRN
  Administered 2020-02-24: 11:00:00 75 mL via INTRAVENOUS

## 2020-02-24 NOTE — Progress Notes (Signed)
Pharmacist Chemotherapy Monitoring - Follow Up Assessment    I verify that I have reviewed each item in the below checklist:  . Regimen for the patient is scheduled for the appropriate day and plan matches scheduled date. Marland Kitchen Appropriate non-routine labs are ordered dependent on drug ordered. . If applicable, additional medications reviewed and ordered per protocol based on lifetime cumulative doses and/or treatment regimen.   Plan for follow-up and/or issues identified: No . I-vent associated with next due treatment: No . MD and/or nursing notified: No  Wynona Neat 02/24/2020 4:18 PM

## 2020-02-26 ENCOUNTER — Other Ambulatory Visit (HOSPITAL_COMMUNITY): Payer: Self-pay | Admitting: *Deleted

## 2020-02-26 DIAGNOSIS — C3411 Malignant neoplasm of upper lobe, right bronchus or lung: Secondary | ICD-10-CM

## 2020-02-26 DIAGNOSIS — E278 Other specified disorders of adrenal gland: Secondary | ICD-10-CM

## 2020-02-27 ENCOUNTER — Inpatient Hospital Stay (HOSPITAL_BASED_OUTPATIENT_CLINIC_OR_DEPARTMENT_OTHER): Payer: Medicare HMO | Admitting: Hematology

## 2020-02-27 ENCOUNTER — Encounter (HOSPITAL_COMMUNITY): Payer: Self-pay | Admitting: Hematology

## 2020-02-27 ENCOUNTER — Other Ambulatory Visit (HOSPITAL_COMMUNITY): Payer: Medicare HMO

## 2020-02-27 ENCOUNTER — Other Ambulatory Visit: Payer: Self-pay

## 2020-02-27 ENCOUNTER — Inpatient Hospital Stay (HOSPITAL_COMMUNITY): Payer: Medicare HMO | Attending: Hematology

## 2020-02-27 ENCOUNTER — Inpatient Hospital Stay (HOSPITAL_COMMUNITY): Payer: Medicare HMO

## 2020-02-27 ENCOUNTER — Ambulatory Visit (HOSPITAL_COMMUNITY): Payer: Medicare HMO | Admitting: Hematology

## 2020-02-27 VITALS — BP 126/77 | HR 90 | Temp 96.6°F | Resp 18 | Wt 101.4 lb

## 2020-02-27 VITALS — BP 118/56 | HR 67 | Temp 97.8°F | Resp 18

## 2020-02-27 DIAGNOSIS — Z79899 Other long term (current) drug therapy: Secondary | ICD-10-CM | POA: Diagnosis not present

## 2020-02-27 DIAGNOSIS — R630 Anorexia: Secondary | ICD-10-CM | POA: Diagnosis not present

## 2020-02-27 DIAGNOSIS — R69 Illness, unspecified: Secondary | ICD-10-CM | POA: Diagnosis not present

## 2020-02-27 DIAGNOSIS — C3411 Malignant neoplasm of upper lobe, right bronchus or lung: Secondary | ICD-10-CM | POA: Diagnosis not present

## 2020-02-27 DIAGNOSIS — M549 Dorsalgia, unspecified: Secondary | ICD-10-CM | POA: Diagnosis not present

## 2020-02-27 DIAGNOSIS — F17210 Nicotine dependence, cigarettes, uncomplicated: Secondary | ICD-10-CM | POA: Diagnosis not present

## 2020-02-27 DIAGNOSIS — E278 Other specified disorders of adrenal gland: Secondary | ICD-10-CM

## 2020-02-27 DIAGNOSIS — Z79891 Long term (current) use of opiate analgesic: Secondary | ICD-10-CM | POA: Diagnosis not present

## 2020-02-27 DIAGNOSIS — Z5112 Encounter for antineoplastic immunotherapy: Secondary | ICD-10-CM | POA: Diagnosis present

## 2020-02-27 DIAGNOSIS — C7951 Secondary malignant neoplasm of bone: Secondary | ICD-10-CM | POA: Diagnosis not present

## 2020-02-27 DIAGNOSIS — M79606 Pain in leg, unspecified: Secondary | ICD-10-CM | POA: Insufficient documentation

## 2020-02-27 DIAGNOSIS — C7971 Secondary malignant neoplasm of right adrenal gland: Secondary | ICD-10-CM | POA: Insufficient documentation

## 2020-02-27 DIAGNOSIS — Z923 Personal history of irradiation: Secondary | ICD-10-CM | POA: Diagnosis not present

## 2020-02-27 DIAGNOSIS — Z9221 Personal history of antineoplastic chemotherapy: Secondary | ICD-10-CM | POA: Insufficient documentation

## 2020-02-27 LAB — CBC WITH DIFFERENTIAL/PLATELET
Abs Immature Granulocytes: 0.02 10*3/uL (ref 0.00–0.07)
Basophils Absolute: 0 10*3/uL (ref 0.0–0.1)
Basophils Relative: 0 %
Eosinophils Absolute: 0.3 10*3/uL (ref 0.0–0.5)
Eosinophils Relative: 4 %
HCT: 42.3 % (ref 36.0–46.0)
Hemoglobin: 13.4 g/dL (ref 12.0–15.0)
Immature Granulocytes: 0 %
Lymphocytes Relative: 32 %
Lymphs Abs: 2.2 10*3/uL (ref 0.7–4.0)
MCH: 30.4 pg (ref 26.0–34.0)
MCHC: 31.7 g/dL (ref 30.0–36.0)
MCV: 95.9 fL (ref 80.0–100.0)
Monocytes Absolute: 0.5 10*3/uL (ref 0.1–1.0)
Monocytes Relative: 7 %
Neutro Abs: 4 10*3/uL (ref 1.7–7.7)
Neutrophils Relative %: 57 %
Platelets: 282 10*3/uL (ref 150–400)
RBC: 4.41 MIL/uL (ref 3.87–5.11)
RDW: 14.9 % (ref 11.5–15.5)
WBC: 7 10*3/uL (ref 4.0–10.5)
nRBC: 0 % (ref 0.0–0.2)

## 2020-02-27 LAB — COMPREHENSIVE METABOLIC PANEL
ALT: 10 U/L (ref 0–44)
AST: 15 U/L (ref 15–41)
Albumin: 3.9 g/dL (ref 3.5–5.0)
Alkaline Phosphatase: 65 U/L (ref 38–126)
Anion gap: 9 (ref 5–15)
BUN: 11 mg/dL (ref 8–23)
CO2: 27 mmol/L (ref 22–32)
Calcium: 9.9 mg/dL (ref 8.9–10.3)
Chloride: 104 mmol/L (ref 98–111)
Creatinine, Ser: 1.16 mg/dL — ABNORMAL HIGH (ref 0.44–1.00)
GFR calc Af Amer: 58 mL/min — ABNORMAL LOW (ref >60)
GFR calc non Af Amer: 50 mL/min — ABNORMAL LOW (ref >60)
Glucose, Bld: 95 mg/dL (ref 70–99)
Potassium: 4 mmol/L (ref 3.5–5.1)
Sodium: 140 mmol/L (ref 135–145)
Total Bilirubin: 0.5 mg/dL (ref 0.3–1.2)
Total Protein: 7.2 g/dL (ref 6.5–8.1)

## 2020-02-27 MED ORDER — SODIUM CHLORIDE 0.9% FLUSH
10.0000 mL | INTRAVENOUS | Status: DC | PRN
  Administered 2020-02-27: 13:00:00 10 mL

## 2020-02-27 MED ORDER — SODIUM CHLORIDE 0.9 % IV SOLN: Freq: Once | INTRAVENOUS | Status: AC

## 2020-02-27 MED ORDER — HEPARIN SOD (PORK) LOCK FLUSH 100 UNIT/ML IV SOLN
500.0000 [IU] | Freq: Once | INTRAVENOUS | Status: AC | PRN
  Administered 2020-02-27: 500 [IU]

## 2020-02-27 MED ORDER — SODIUM CHLORIDE 0.9 % IV SOLN
480.0000 mg | Freq: Once | INTRAVENOUS | Status: AC
  Administered 2020-02-27: 480 mg via INTRAVENOUS
  Filled 2020-02-27: qty 48

## 2020-02-27 NOTE — Progress Notes (Signed)
[facility] [place], [place] [address]   CLINIC:  Medical Oncology/Hematology  PCP:  [name], MD [place] [address] [phone]   REASON FOR VISIT: Metastatic lung cancer  CURRENT THERAPY:Opidvo every 4 weeks  BRIEF ONCOLOGIC HISTORY:  Oncology History  Cancer of upper lobe of right lung ([place])  07/28/2014 Imaging   CT chest: Large R apical mass consistent with malignancy. This is destroying the R 2nd rib with extension into adjacent soft tissue. R hilar adenopathy with R 5cm adrenal metastatic lesion.   08/01/2014 Initial Biopsy   Lung, needle/core biopsy(ies), right upper lobe - POORLY DIFFERENTIATED ADENOCARCINOMA, SEE COMMENT.   08/08/2014 PET scan   Large hypermetabolic R apical mass with evidence of direct chest wall and mediastinal invasion, right retrocrural lymphadenopathy, extensive retroperitoneal lymphadenopathy, and metastatic lesions to the adrenal glands    09/02/2014 - 11/04/2014 Chemotherapy   Cisplatin/Pemetrexed/Avastin every 21 days x 4 cycles   10/07/2014 - 10/27/2014 Radiation Therapy   Right lung apex for control of brachioplexopathy.   12/24/2014 - 02/25/2015 Chemotherapy   Alimta/Avastin every 21 days.   02/20/2015 Imaging   Increase in size of right adrenal metastasis and subjacent confluent retrocaval lymphadenopathy   02/25/2015 -  Chemotherapy   Nivolumab, zometa   05/04/2015 Imaging   CT CAP- Stable to slight decrease in the posterior right apical lesion. Stable appearance of posterior right upper rib an upper thoracic bony lesions. Slight improvement in right upper lobe tree-in-bud opacity. No new or progressive findings in...   07/28/2015 Imaging   CT CAP- Reduced size of the right apical pleural parenchymal lesion and reduced size of the right adrenal metastatic lesion. Resolution of prior retrocrural adenopathy.  Right eccentric T1 and T2 sclerosis with sclerosis and tapering of the right  second..   11/17/2015 Imaging   CT CAP- Stable soft tissue thickening in the apex of the right hemi thorax. Stable right adrenal metastasis. Nodularity along the trachea and mainstem bronchi, relatively new from 07/28/2015, favoring adherent debris.   11/18/2015 Treatment Plan Change   Zometa HELD for upcoming tooth extraction   11/24/2015 Treatment Plan Change   Zometa on hold at this time in preparation for tooth extraction in March 2017.  Zometa las given on 11/18/2015.   02/03/2016 Imaging   CT CAP- Heterogeneous right apical masslike consolidation and right adrenal metastasis are unchanged   04/06/2016 Treatment Plan Change   Zometa restarted 6 weeks out from tooth extraction (04/06/2016)   05/12/2016 Imaging   CT CAP- NED in the chest, abdomen or pelvis.  Some areas of nodularity associated with the mainstem bronchi in the left upper lobe bronchus, favored to represent adherent inspissated secretions   08/17/2016 Imaging   CT CAP- 1. Stable CTs of the chest and abdomen. No evidence of progressive metastatic disease. 2. Probable treated tumor at the right apex, right adrenal gland and T2 vertebral body, stable. 3. Fluctuating nodularity along the walls of the trachea and mainstem bronchi, likely secretions.   12/12/2016 Imaging   Further decrease in size of treated tumor within the right apex. 2. Stable treated tumor involving the right second rib and T2 vertebra. 3. Stable right adrenal gland treated tumor. 4. Emphysema 5. Aortic atherosclerosis   01/04/2017 Imaging   MRI brain- Normal brain MRI.  No intracranial metastatic disease.   03/15/2017 Imaging   CT CAP- 1. No new or progressive metastatic disease in the chest or abdomen. 2. Stable  treated tumor in the apical right upper lobe. Stable treated right posterior second rib and right T2 vertebral lesions. Stable treated right adrenal metastasis. 3. Aortic atherosclerosis. 4. Moderate emphysema with mild diffuse bronchial  wall thickening, suggesting COPD.   06/12/2017 Imaging   CT CAP 1. Similar appearance of treated primary within the right apex. 2. Similar areas of sclerosis within the right second rib, T2, and less so T1 vertebral bodies. These are most consistent with treated metastasis. 3. Similar right adrenal treated metastasis. 4. No evidence of new or progressive disease. 5. Similar right and progressive left areas of bronchial wall thickening and probable mucoid impaction. Correlate with interval infectious symptoms. 6.  Emphysema (ICD10-J43.9). 7. Coronary artery atherosclerosis. Aortic Atherosclerosis (ICD10-I70.0).    10/16/2017 Imaging   CT CAP 1. Stable appearance of the prior Pancoast tumor and related bony findings in the right second rib and right T1 and T2 vertebra compatible with successfully treated tumor. No significant enlargement or new lesions are identified. Similarly the treated right adrenal metastatic lesion is stable in appearance. 2. Upper normal size right hilar lymph node may warrant surveillance. Currently 9 mm in short axis. 3. Other imaging findings of potential clinical significance: Aortic Atherosclerosis (ICD10-I70.0) and Emphysema (ICD10-J43.9). Scattered proximal sigmoid colon diverticula. Mucus plugging medially in the left upper lobe and posteriorly in the right upper lobe.      INTERVAL HISTORY:  Melissa Sullivan 64 y.o. female seen for follow-up of metastatic lung cancer.  She is receiving Opdivo every 4 weeks.  Denied any diarrhea, skin rashes or dry cough/shortness of breath.  Denies any fevers or infections.  Appetite and energy levels are 100%.  Weight has been stable.  She does have back pain and leg pain which is controlled with oxycodone.  No recent ER visits or hospitalizations.  REVIEW OF SYSTEMS:  Review of Systems  Musculoskeletal:       Occasional leg pains.  All other systems reviewed and are negative.    PAST MEDICAL/SURGICAL HISTORY:   Past Medical History:  Diagnosis Date  . Adrenal mass, right ([place]) 07/28/2014  . Anemia   . Bone metastases ([place]) 04/05/2016  . Cancer ([place])    lung  right  . Diabetes mellitus without complication ([place])   . GERD (gastroesophageal reflux disease)   . Hyperlipidemia   . Hypertension   . Hypothyroidism due to medication 01/30/2017  . Lung mass 07/28/2014  . Reflux    Past Surgical History:  Procedure Laterality Date  . AMPUTATION Right 02/22/2017   Procedure: PARTIAL AMPUTATION RIGHT GREAT TOE;  Surgeon: Caprice Beaver, DPM;  Location: [facility];  Service: Podiatry;  Laterality: Right;  . APPENDECTOMY    . ESOPHAGOGASTRODUODENOSCOPY N/A 12/09/2014   HQI:ONGEXB esophageal stricture/mild-to-noderate erosive gastritis. negative H.pylori  . FLEXIBLE SIGMOIDOSCOPY  2011   Dr. [name]: hyperplastic polyp  . LUNG BIOPSY Right 07/2014   CT guided  . MALONEY DILATION N/A 12/09/2014   Procedure: [name] DILATION;  Surgeon: [name], MD;  Location: [facility];  Service: Endoscopy;  Laterality: N/A;  . PORTACATH PLACEMENT Left 09/01/2014  . SAVORY DILATION N/A 12/09/2014   Procedure: SAVORY DILATION;  Surgeon: [name], MD;  Location: [facility];  Service: Endoscopy;  Laterality: N/A;     SOCIAL HISTORY:  Social History   Socioeconomic History  . Marital status: Married    Spouse name: Not on file  . Number of children: Not on file  . Years of education: Not on file  .  Highest education level: Not on file  Occupational History  . Not on file  Tobacco Use  . Smoking status: Light Tobacco Smoker    Packs/day: 0.30    Years: 20.00    Pack years: 6.00    Types: Cigarettes  . Smokeless tobacco: Never Used  Substance and Sexual Activity  . Alcohol use: No  . Drug use: No  . Sexual activity: Not on file  Other Topics Concern  . Not on file  Social History Narrative  . Not on file   Social Determinants of Health   Financial Resource Strain:   . Difficulty of Paying  Living Expenses:   Food Insecurity:   . Worried About Charity fundraiser in the Last Year:   . Arboriculturist in the Last Year:   Transportation Needs:   . Film/video editor (Medical):   Marland Kitchen Lack of Transportation (Non-Medical):   Physical Activity:   . Days of Exercise per Week:   . Minutes of Exercise per Session:   Stress:   . Feeling of Stress :   Social Connections:   . Frequency of Communication with Friends and Family:   . Frequency of Social Gatherings with Friends and Family:   . Attends Religious Services:   . Active Member of Clubs or Organizations:   . Attends Archivist Meetings:   Marland Kitchen Marital Status:   Intimate Partner Violence:   . Fear of Current or Ex-Partner:   . Emotionally Abused:   Marland Kitchen Physically Abused:   . Sexually Abused:     FAMILY HISTORY:  Family History  Problem Relation Age of Onset  . Cancer Sister   . Colon cancer Neg Hx     CURRENT MEDICATIONS:  Outpatient Encounter Medications as of 02/27/2020  Medication Sig  . dexlansoprazole (DEXILANT) 60 MG capsule 1 PO EVERY MORNING WITH BREAKFAST.  Marland Kitchen dronabinol (MARINOL) 2.5 MG capsule Take 1 capsule (2.5 mg total) by mouth 2 (two) times daily before lunch and supper.  Marland Kitchen ENSURE (ENSURE) Take 1 Can by mouth 4 (four) times daily.  . Fluticasone-Salmeterol (ADVAIR DISKUS) 500-50 MCG/DOSE AEPB Inhale 1 puff into the lungs 2 (two) times daily.  Marland Kitchen ibuprofen (ADVIL,MOTRIN) 200 MG tablet Take 400 mg by mouth every 8 (eight) hours as needed (for pain.).  Marland Kitchen levocetirizine (XYZAL) 5 MG tablet TAKE 1 TABLET BY MOUTH AT BEDTIME FOR ALLERGIES COUGH  . levothyroxine (SYNTHROID) 25 MCG tablet TAKE 1/2 (ONE-HALF) TABLET BY MOUTH ONCE DAILY BEFORE BREAKFAST  . lidocaine-prilocaine (EMLA) cream APPLY A QUARTER SIZE AMOUNT TO PORT SITE 1 HOUR PRIOR TO CHEMO.  DO NOT RUB IN.  COVER WITH PLASTIC WRAP  . magic mouthwash SOLN Take 5 mLs by mouth 4 (four) times daily as needed for mouth pain.  . Nivolumab (OPDIVO  IV) Inject into the vein every 28 (twenty-eight) days.  Marland Kitchen omeprazole (PRILOSEC) 40 MG capsule TAKE 1 CAPSULE BY MOUTH ONCE DAILY. TAKE 30 MINUTES PRIOR TO YOUR FIRST MEAL. (Patient not taking: Reported on 02/27/2020)  . ondansetron (ZOFRAN) 8 MG tablet Take 1 tablet (8 mg total) by mouth every 8 (eight) hours as needed for nausea or vomiting.  . Oxycodone HCl 10 MG TABS Take 1-2 tablets (10-20 mg total) by mouth every 12 (twelve) hours as needed (pain.).  Marland Kitchen simvastatin (ZOCOR) 40 MG tablet Take 40 mg by mouth daily.  . [DISCONTINUED] amoxicillin (AMOXIL) 500 MG capsule Take 500 mg by mouth as needed.   . [DISCONTINUED] dronabinol (  MARINOL) 2.5 MG capsule Take 1 capsule (2.5 mg total) by mouth 2 (two) times daily before lunch and supper.   Facility-Administered Encounter Medications as of 02/27/2020  Medication  . sodium chloride flush (NS) 0.9 % injection 10 mL    ALLERGIES:  Allergies  Allergen Reactions  . Xgeva [Denosumab]     osteonecrosis  . Omeprazole     Made legs hurt     PHYSICAL EXAM:  ECOG Performance status: 1  Vitals:   02/27/20 0924  BP: 126/77  Pulse: 90  Resp: 18  Temp: (!) 96.6 F (35.9 C)  SpO2: 100%   Filed Weights   02/27/20 0924  Weight: 101 lb 6.4 oz (46 kg)    Physical Exam Constitutional:      Appearance: Normal appearance. She is normal weight.  Cardiovascular:     Rate and Rhythm: Normal rate and regular rhythm.     Heart sounds: Normal heart sounds.  Pulmonary:     Effort: Pulmonary effort is normal.     Breath sounds: Normal breath sounds.  Abdominal:     General: There is no distension.     Palpations: Abdomen is soft. There is no mass.  Musculoskeletal:        General: Normal range of motion.  Skin:    General: Skin is warm and dry.  Neurological:     Mental Status: She is alert and oriented to person, place, and time. Mental status is at baseline.  Psychiatric:        Mood and Affect: Mood normal.        Behavior: Behavior normal.       LABORATORY DATA:  I have reviewed the labs as listed.  CBC    Component Value Date/Time   WBC 7.0 02/27/2020 0902   RBC 4.41 02/27/2020 0902   HGB 13.4 02/27/2020 0902   HCT 42.3 02/27/2020 0902   PLT 282 02/27/2020 0902   MCV 95.9 02/27/2020 0902   MCH 30.4 02/27/2020 0902   MCHC 31.7 02/27/2020 0902   RDW 14.9 02/27/2020 0902   LYMPHSABS 2.2 02/27/2020 0902   MONOABS 0.5 02/27/2020 0902   EOSABS 0.3 02/27/2020 0902   BASOSABS 0.0 02/27/2020 0902   CMP Latest Ref Rng & Units 02/27/2020 01/31/2020 01/03/2020  Glucose 70 - 99 mg/dL 95 113(H) 97  BUN 8 - 23 mg/dL 11 9 12   Creatinine 0.44 - 1.00 mg/dL 1.16(H) 1.21(H) 1.24(H)  Sodium 135 - 145 mmol/L 140 138 137  Potassium 3.5 - 5.1 mmol/L 4.0 4.3 3.9  Chloride 98 - 111 mmol/L 104 104 104  CO2 22 - 32 mmol/L 27 26 26   Calcium 8.9 - 10.3 mg/dL 9.9 9.5 9.9  Total Protein 6.5 - 8.1 g/dL 7.2 7.8 7.9  Total Bilirubin 0.3 - 1.2 mg/dL 0.5 0.5 0.4  Alkaline Phos 38 - 126 U/L 65 65 66  AST 15 - 41 U/L 15 16 18   ALT 0 - 44 U/L 10 11 13        DIAGNOSTIC IMAGING:  I have reviewed the scans and discussed with the patient.     ASSESSMENT & PLAN:   Cancer of upper lobe of right lung 1.  Metastatic adenocarcinoma of the lung to the bones and right adrenal: -Foundation 1 testing shows 14 genomic alterations, no approved therapies. -Opdivo 480 mg monthly started on 02/25/2015. -We reviewed results of CT CAP from 02/21/2020.  No evidence of progression or metastatic disease seen.  T2 sclerotic lesion is stable. -I have  reviewed her labs.  LFTs are normal.  CBC is normal with normal white count and platelets. -She will proceed with her treatment today.  We will reevaluate her in 4 weeks.  2.  Back pain and leg pain: -She will continue oxycodone 1 tablet as needed.  She takes it every other day.  3.  Appetite: -I have sent a prescription for Marinol 5 mg twice daily.  4.  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 -She had surgeries on her jaw for the ONJ.  Denosumab on hold indefinitely.  5.  High risk drug monitoring: -TSH on 01/31/2020 was normal.  We will monitor it every other cycle.   Orders placed this encounter:  Orders Placed This Encounter  Procedures  . CBC with Differential  . Comprehensive metabolic panel      Derek Jack, MD [place] [phone]

## 2020-02-27 NOTE — Progress Notes (Signed)
Patient presents today for treatment and follow up visit with Dr. Delton Coombes. Labs pending. Vital signs are within parameters for treatment. Labs pending. Patient denies pain today. Patient states she was having decreased appetite when her legs were hurting from her acid reflux medication. Patient states her Physician placed her on another type of medication and the leg pain has resolved. Appetite is better per patient's words. Patient currently on Dexilant 60 mg PO daily with breakfast. Prilosec discontinued by provider per patient's words. MAR updated.   Message received from [facility] LPN/ Dr. Delton Coombes. Proceed with treatment. Labs reviewed.   Treatment given today per MD orders. Tolerated infusion without adverse affects. Vital signs stable. No complaints at this time. Discharged from clinic ambulatory. F/U with [facility] as scheduled.

## 2020-02-27 NOTE — Patient Instructions (Signed)
[facility] Discharge Instructions for Patients Receiving Chemotherapy  Today you received the following chemotherapy agents   To help prevent nausea and vomiting after your treatment, we encourage you to take your nausea medication   If you develop nausea and vomiting that is not controlled by your nausea medication, call the clinic.   BELOW ARE SYMPTOMS THAT SHOULD BE REPORTED IMMEDIATELY:  *FEVER GREATER THAN 100.5 F  *CHILLS WITH OR WITHOUT FEVER  NAUSEA AND VOMITING THAT IS NOT CONTROLLED WITH YOUR NAUSEA MEDICATION  *UNUSUAL SHORTNESS OF BREATH  *UNUSUAL BRUISING OR BLEEDING  TENDERNESS IN MOUTH AND THROAT WITH OR WITHOUT PRESENCE OF ULCERS  *URINARY PROBLEMS  *BOWEL PROBLEMS  UNUSUAL RASH Items with * indicate a potential emergency and should be followed up as soon as possible.  Feel free to call the clinic should you have any questions or concerns. The clinic phone number is [phone].  Please show the CHEMO ALERT CARD at check-in to the Emergency Department and triage nurse.   

## 2020-02-27 NOTE — Patient Instructions (Addendum)
[place] at [facility] Discharge Instructions  You were seen today by Dr. Delton Coombes. He went over your recent lab results. Dr. Delton Coombes will send Marinol in to your pharmacy.  He will see you back in 4 weeks for labs, treatment and follow up.   Thank you for choosing [place] at [facility] to provide your oncology and hematology care.  To afford each patient quality time with our provider, please arrive at least 15 minutes before your scheduled appointment time.   If you have a lab appointment with the [facility] please come in thru the  Main Entrance and check in at the main information desk  You need to re-schedule your appointment should you arrive 10 or more minutes late.  We strive to give you quality time with our providers, and arriving late affects you and other patients whose appointments are after yours.  Also, if you no show three or more times for appointments you may be dismissed from the clinic at the providers discretion.     Again, thank you for choosing [facility].  Our hope is that these requests will decrease the amount of time that you wait before being seen by our physicians.       _____________________________________________________________  Should you have questions after your visit to [facility], please contact our office at [phone] between the hours of 8:00 a.m. and 4:30 p.m.  Voicemails left after 4:00 p.m. will not be returned until the following business day.  For prescription refill requests, have your pharmacy contact our office and allow 72 hours.    Cancer Center Support Programs:   > Cancer Support Group  2nd Tuesday of the month 1pm-2pm, Journey Room

## 2020-02-27 NOTE — Progress Notes (Signed)
Patient has been assessed, vital signs and labs have been reviewed by Dr. Katragadda. ANC, Creatinine, LFTs, and Platelets are within treatment parameters per Dr. Katragadda. The patient is good to proceed with treatment at this time.  

## 2020-02-28 ENCOUNTER — Inpatient Hospital Stay (HOSPITAL_COMMUNITY): Payer: Medicare HMO

## 2020-02-28 MED ORDER — DRONABINOL 2.5 MG PO CAPS: 2.5000 mg | ORAL_CAPSULE | Freq: Two times a day (BID) | ORAL | 2 refills | Status: DC

## 2020-02-28 NOTE — Assessment & Plan Note (Addendum)
1.  Metastatic adenocarcinoma of the lung to the bones and right adrenal: -Foundation 1 testing shows 14 genomic alterations, no approved therapies. -Opdivo 480 mg monthly started on 02/25/2015. -We reviewed results of CT CAP from 02/21/2020.  No evidence of progression or metastatic disease seen.  T2 sclerotic lesion is stable. -I have reviewed her labs.  LFTs are normal.  CBC is normal with normal white count and platelets. -She will proceed with her treatment today.  We will reevaluate her in 4 weeks.  2.  Back pain and leg pain: -She will continue oxycodone 1 tablet as needed.  She takes it every other day.  3.  Appetite: -I have sent a prescription for Marinol 5 mg twice daily.  4.  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 -She had surgeries on her jaw for the ONJ.  Denosumab on hold indefinitely.  5.  High risk drug monitoring: -TSH on 01/31/2020 was normal.  We will monitor it every other cycle.

## 2020-03-20 DIAGNOSIS — C3490 Malignant neoplasm of unspecified part of unspecified bronchus or lung: Secondary | ICD-10-CM | POA: Diagnosis not present

## 2020-03-20 DIAGNOSIS — G629 Polyneuropathy, unspecified: Secondary | ICD-10-CM | POA: Diagnosis not present

## 2020-03-20 DIAGNOSIS — B351 Tinea unguium: Secondary | ICD-10-CM | POA: Diagnosis not present

## 2020-03-23 DIAGNOSIS — D508 Other iron deficiency anemias: Secondary | ICD-10-CM | POA: Diagnosis not present

## 2020-03-23 DIAGNOSIS — E039 Hypothyroidism, unspecified: Secondary | ICD-10-CM | POA: Diagnosis not present

## 2020-03-23 DIAGNOSIS — R7301 Impaired fasting glucose: Secondary | ICD-10-CM | POA: Diagnosis not present

## 2020-03-23 DIAGNOSIS — E782 Mixed hyperlipidemia: Secondary | ICD-10-CM | POA: Diagnosis not present

## 2020-03-23 DIAGNOSIS — D649 Anemia, unspecified: Secondary | ICD-10-CM | POA: Diagnosis not present

## 2020-03-23 DIAGNOSIS — M546 Pain in thoracic spine: Secondary | ICD-10-CM | POA: Diagnosis not present

## 2020-03-26 ENCOUNTER — Inpatient Hospital Stay (HOSPITAL_COMMUNITY): Payer: Medicare HMO

## 2020-03-26 ENCOUNTER — Inpatient Hospital Stay (HOSPITAL_BASED_OUTPATIENT_CLINIC_OR_DEPARTMENT_OTHER): Payer: Medicare HMO | Admitting: Hematology

## 2020-03-26 ENCOUNTER — Inpatient Hospital Stay (HOSPITAL_COMMUNITY): Payer: Medicare HMO | Attending: Hematology

## 2020-03-26 ENCOUNTER — Encounter (HOSPITAL_COMMUNITY): Payer: Self-pay | Admitting: Hematology

## 2020-03-26 ENCOUNTER — Encounter (HOSPITAL_COMMUNITY): Payer: Self-pay

## 2020-03-26 ENCOUNTER — Other Ambulatory Visit: Payer: Self-pay

## 2020-03-26 VITALS — BP 104/54 | HR 72 | Temp 97.1°F | Resp 18 | Wt 101.8 lb

## 2020-03-26 DIAGNOSIS — C7951 Secondary malignant neoplasm of bone: Secondary | ICD-10-CM | POA: Insufficient documentation

## 2020-03-26 DIAGNOSIS — R69 Illness, unspecified: Secondary | ICD-10-CM | POA: Diagnosis not present

## 2020-03-26 DIAGNOSIS — C3411 Malignant neoplasm of upper lobe, right bronchus or lung: Secondary | ICD-10-CM | POA: Insufficient documentation

## 2020-03-26 DIAGNOSIS — Z5112 Encounter for antineoplastic immunotherapy: Secondary | ICD-10-CM | POA: Insufficient documentation

## 2020-03-26 DIAGNOSIS — Z923 Personal history of irradiation: Secondary | ICD-10-CM | POA: Diagnosis not present

## 2020-03-26 DIAGNOSIS — Z9221 Personal history of antineoplastic chemotherapy: Secondary | ICD-10-CM | POA: Insufficient documentation

## 2020-03-26 DIAGNOSIS — C7971 Secondary malignant neoplasm of right adrenal gland: Secondary | ICD-10-CM | POA: Diagnosis not present

## 2020-03-26 DIAGNOSIS — M79606 Pain in leg, unspecified: Secondary | ICD-10-CM | POA: Diagnosis not present

## 2020-03-26 DIAGNOSIS — F17210 Nicotine dependence, cigarettes, uncomplicated: Secondary | ICD-10-CM | POA: Insufficient documentation

## 2020-03-26 DIAGNOSIS — Z79899 Other long term (current) drug therapy: Secondary | ICD-10-CM | POA: Insufficient documentation

## 2020-03-26 DIAGNOSIS — M549 Dorsalgia, unspecified: Secondary | ICD-10-CM | POA: Diagnosis not present

## 2020-03-26 DIAGNOSIS — M87180 Osteonecrosis due to drugs, jaw: Secondary | ICD-10-CM | POA: Insufficient documentation

## 2020-03-26 DIAGNOSIS — E278 Other specified disorders of adrenal gland: Secondary | ICD-10-CM

## 2020-03-26 LAB — CBC WITH DIFFERENTIAL/PLATELET
Abs Immature Granulocytes: 0.03 10*3/uL (ref 0.00–0.07)
Basophils Absolute: 0 10*3/uL (ref 0.0–0.1)
Basophils Relative: 0 %
Eosinophils Absolute: 0.3 10*3/uL (ref 0.0–0.5)
Eosinophils Relative: 4 %
HCT: 41.8 % (ref 36.0–46.0)
Hemoglobin: 13.2 g/dL (ref 12.0–15.0)
Immature Granulocytes: 0 %
Lymphocytes Relative: 29 %
Lymphs Abs: 2.5 10*3/uL (ref 0.7–4.0)
MCH: 30.1 pg (ref 26.0–34.0)
MCHC: 31.6 g/dL (ref 30.0–36.0)
MCV: 95.4 fL (ref 80.0–100.0)
Monocytes Absolute: 0.6 10*3/uL (ref 0.1–1.0)
Monocytes Relative: 7 %
Neutro Abs: 5.1 10*3/uL (ref 1.7–7.7)
Neutrophils Relative %: 60 %
Platelets: 285 10*3/uL (ref 150–400)
RBC: 4.38 MIL/uL (ref 3.87–5.11)
RDW: 14.7 % (ref 11.5–15.5)
WBC: 8.6 10*3/uL (ref 4.0–10.5)
nRBC: 0 % (ref 0.0–0.2)

## 2020-03-26 LAB — COMPREHENSIVE METABOLIC PANEL
ALT: 11 U/L (ref 0–44)
AST: 15 U/L (ref 15–41)
Albumin: 3.9 g/dL (ref 3.5–5.0)
Alkaline Phosphatase: 80 U/L (ref 38–126)
Anion gap: 7 (ref 5–15)
BUN: 14 mg/dL (ref 8–23)
CO2: 26 mmol/L (ref 22–32)
Calcium: 9.3 mg/dL (ref 8.9–10.3)
Chloride: 104 mmol/L (ref 98–111)
Creatinine, Ser: 1.14 mg/dL — ABNORMAL HIGH (ref 0.44–1.00)
GFR calc Af Amer: 59 mL/min — ABNORMAL LOW (ref >60)
GFR calc non Af Amer: 51 mL/min — ABNORMAL LOW (ref >60)
Glucose, Bld: 86 mg/dL (ref 70–99)
Potassium: 3.9 mmol/L (ref 3.5–5.1)
Sodium: 137 mmol/L (ref 135–145)
Total Bilirubin: 0.4 mg/dL (ref 0.3–1.2)
Total Protein: 7.9 g/dL (ref 6.5–8.1)

## 2020-03-26 MED ORDER — HEPARIN SOD (PORK) LOCK FLUSH 100 UNIT/ML IV SOLN
500.0000 [IU] | Freq: Once | INTRAVENOUS | Status: AC | PRN
  Administered 2020-03-26: 500 [IU]

## 2020-03-26 MED ORDER — SODIUM CHLORIDE 0.9 % IV SOLN
480.0000 mg | Freq: Once | INTRAVENOUS | Status: AC
  Administered 2020-03-26: 480 mg via INTRAVENOUS
  Filled 2020-03-26: qty 48

## 2020-03-26 MED ORDER — SODIUM CHLORIDE 0.9 % IV SOLN: Freq: Once | INTRAVENOUS | Status: AC

## 2020-03-26 MED ORDER — SODIUM CHLORIDE 0.9% FLUSH
10.0000 mL | INTRAVENOUS | Status: DC | PRN
  Administered 2020-03-26: 10 mL

## 2020-03-26 NOTE — Progress Notes (Signed)
Patient tolerated therapy with no complaints voiced.  Side effects with management reviewed with understanding verbalized.  Port site clean and dry with no bruising or swelling noted at site.  No blood return noted before and after administration of therapy.  Denied pain with flush. Band aid applied.  Patient left ambulatory with VSS and no s/s of distress noted.

## 2020-03-26 NOTE — Progress Notes (Signed)
Patient has been assessed, vital signs and labs have been reviewed by Dr. Katragadda. ANC, Creatinine, LFTs, and Platelets are within treatment parameters per Dr. Katragadda. The patient is good to proceed with treatment at this time.  

## 2020-03-26 NOTE — Patient Instructions (Addendum)
[place] at [facility] Discharge Instructions  You were seen today by Dr. Delton Coombes. He went over your recent lab results. He will see you back in 6 weeks for labs, treatment and follow up.   Thank you for choosing [place] at [facility] to provide your oncology and hematology care.  To afford each patient quality time with our provider, please arrive at least 15 minutes before your scheduled appointment time.   If you have a lab appointment with the [facility] please come in thru the  Main Entrance and check in at the main information desk  You need to re-schedule your appointment should you arrive 10 or more minutes late.  We strive to give you quality time with our providers, and arriving late affects you and other patients whose appointments are after yours.  Also, if you no show three or more times for appointments you may be dismissed from the clinic at the providers discretion.     Again, thank you for choosing [facility].  Our hope is that these requests will decrease the amount of time that you wait before being seen by our physicians.       _____________________________________________________________  Should you have questions after your visit to [facility], please contact our office at [phone] between the hours of 8:00 a.m. and 4:30 p.m.  Voicemails left after 4:00 p.m. will not be returned until the following business day.  For prescription refill requests, have your pharmacy contact our office and allow 72 hours.    Cancer Center Support Programs:   > Cancer Support Group  2nd Tuesday of the month 1pm-2pm, Journey Room

## 2020-03-26 NOTE — Progress Notes (Signed)
[place], [place] [address]   CLINIC:  Medical Oncology/Hematology  PCP:  [name], MD [place] [address] [phone]   REASON FOR VISIT: Metastatic lung cancer  CURRENT THERAPY:Opidvo every 4 weeks  BRIEF ONCOLOGIC HISTORY:  Oncology History  Cancer of upper lobe of right lung ([place])  07/28/2014 Imaging   CT chest: Large R apical mass consistent with malignancy. This is destroying the R 2nd rib with extension into adjacent soft tissue. R hilar adenopathy with R 5cm adrenal metastatic lesion.   08/01/2014 Initial Biopsy   Lung, needle/core biopsy(ies), right upper lobe - POORLY DIFFERENTIATED ADENOCARCINOMA, SEE COMMENT.   08/08/2014 PET scan   Large hypermetabolic R apical mass with evidence of direct chest wall and mediastinal invasion, right retrocrural lymphadenopathy, extensive retroperitoneal lymphadenopathy, and metastatic lesions to the adrenal glands    09/02/2014 - 11/04/2014 Chemotherapy   Cisplatin/Pemetrexed/Avastin every 21 days x 4 cycles   10/07/2014 - 10/27/2014 Radiation Therapy   Right lung apex for control of brachioplexopathy.   12/24/2014 - 02/25/2015 Chemotherapy   Alimta/Avastin every 21 days.   02/20/2015 Imaging   Increase in size of right adrenal metastasis and subjacent confluent retrocaval lymphadenopathy   02/25/2015 -  Chemotherapy   Nivolumab, zometa   05/04/2015 Imaging   CT CAP- Stable to slight decrease in the posterior right apical lesion. Stable appearance of posterior right upper rib an upper thoracic bony lesions. Slight improvement in right upper lobe tree-in-bud opacity. No new or progressive findings in...   07/28/2015 Imaging   CT CAP- Reduced size of the right apical pleural parenchymal lesion and reduced size of the right adrenal metastatic lesion. Resolution of prior retrocrural adenopathy.  Right eccentric T1 and T2 sclerosis with sclerosis and tapering of the right  second..   11/17/2015 Imaging   CT CAP- Stable soft tissue thickening in the apex of the right hemi thorax. Stable right adrenal metastasis. Nodularity along the trachea and mainstem bronchi, relatively new from 07/28/2015, favoring adherent debris.   11/18/2015 Treatment Plan Change   Zometa HELD for upcoming tooth extraction   11/24/2015 Treatment Plan Change   Zometa on hold at this time in preparation for tooth extraction in March 2017.  Zometa las given on 11/18/2015.   02/03/2016 Imaging   CT CAP- Heterogeneous right apical masslike consolidation and right adrenal metastasis are unchanged   04/06/2016 Treatment Plan Change   Zometa restarted 6 weeks out from tooth extraction (04/06/2016)   05/12/2016 Imaging   CT CAP- NED in the chest, abdomen or pelvis.  Some areas of nodularity associated with the mainstem bronchi in the left upper lobe bronchus, favored to represent adherent inspissated secretions   08/17/2016 Imaging   CT CAP- 1. Stable CTs of the chest and abdomen. No evidence of progressive metastatic disease. 2. Probable treated tumor at the right apex, right adrenal gland and T2 vertebral body, stable. 3. Fluctuating nodularity along the walls of the trachea and mainstem bronchi, likely secretions.   12/12/2016 Imaging   Further decrease in size of treated tumor within the right apex. 2. Stable treated tumor involving the right second rib and T2 vertebra. 3. Stable right adrenal gland treated tumor. 4. Emphysema 5. Aortic atherosclerosis   01/04/2017 Imaging   MRI brain- Normal brain MRI.  No intracranial metastatic disease.   03/15/2017 Imaging   CT CAP- 1. No new or progressive metastatic disease in the chest or abdomen. 2. Stable  treated tumor in the apical right upper lobe. Stable treated right posterior second rib and right T2 vertebral lesions. Stable treated right adrenal metastasis. 3. Aortic atherosclerosis. 4. Moderate emphysema with mild diffuse bronchial  wall thickening, suggesting COPD.   06/12/2017 Imaging   CT CAP 1. Similar appearance of treated primary within the right apex. 2. Similar areas of sclerosis within the right second rib, T2, and less so T1 vertebral bodies. These are most consistent with treated metastasis. 3. Similar right adrenal treated metastasis. 4. No evidence of new or progressive disease. 5. Similar right and progressive left areas of bronchial wall thickening and probable mucoid impaction. Correlate with interval infectious symptoms. 6.  Emphysema (ICD10-J43.9). 7. Coronary artery atherosclerosis. Aortic Atherosclerosis (ICD10-I70.0).    10/16/2017 Imaging   CT CAP 1. Stable appearance of the prior Pancoast tumor and related bony findings in the right second rib and right T1 and T2 vertebra compatible with successfully treated tumor. No significant enlargement or new lesions are identified. Similarly the treated right adrenal metastatic lesion is stable in appearance. 2. Upper normal size right hilar lymph node may warrant surveillance. Currently 9 mm in short axis. 3. Other imaging findings of potential clinical significance: Aortic Atherosclerosis (ICD10-I70.0) and Emphysema (ICD10-J43.9). Scattered proximal sigmoid colon diverticula. Mucus plugging medially in the left upper lobe and posteriorly in the right upper lobe.      INTERVAL HISTORY:  Ms. Pettibone 64 y.o. female seen for follow-up of metastatic lung cancer.  Reports appetite and energy levels of 100%.  Low back pain is well controlled with the current pain medication.  No new onset pains reported.  Denies any diarrhea.  Denies any skin rashes.  No shortness of breath or dry cough reported.  REVIEW OF SYSTEMS:  Review of Systems  Musculoskeletal:       Occasional leg pains.  All other systems reviewed and are negative.    PAST MEDICAL/SURGICAL HISTORY:  Past Medical History:  Diagnosis Date  . Adrenal mass, right ([place]) 07/28/2014  .  Anemia   . Bone metastases ([place]) 04/05/2016  . Cancer ([place])    lung  right  . Diabetes mellitus without complication ([place])   . GERD (gastroesophageal reflux disease)   . Hyperlipidemia   . Hypertension   . Hypothyroidism due to medication 01/30/2017  . Lung mass 07/28/2014  . Reflux    Past Surgical History:  Procedure Laterality Date  . AMPUTATION Right 02/22/2017   Procedure: PARTIAL AMPUTATION RIGHT GREAT TOE;  Surgeon: Caprice Beaver, DPM;  Location: [facility] ORS;  Service: Podiatry;  Laterality: Right;  . APPENDECTOMY    . ESOPHAGOGASTRODUODENOSCOPY N/A 12/09/2014   YSA:YTKZSW esophageal stricture/mild-to-noderate erosive gastritis. negative H.pylori  . FLEXIBLE SIGMOIDOSCOPY  2011   Dr. [name]: hyperplastic polyp  . LUNG BIOPSY Right 07/2014   CT guided  . MALONEY DILATION N/A 12/09/2014   Procedure: [name] DILATION;  Surgeon: [name], MD;  Location: [facility] ENDO SUITE;  Service: Endoscopy;  Laterality: N/A;  . PORTACATH PLACEMENT Left 09/01/2014  . SAVORY DILATION N/A 12/09/2014   Procedure: SAVORY DILATION;  Surgeon: [name], MD;  Location: [facility] END[facility] SUITE;  Service: Endoscopy;  Laterality: N/A;     SOCIAL HISTORY:  Social History   Socioeconomic History  . Marital status: Married    Spouse name: Not on file  . Number of children: Not on file  . Years of education: Not on file  . Highest education level: Not on file  Occupational History  . Not  on file  Tobacco Use  . Smoking status: Light Tobacco Smoker    Packs/day: 0.30    Years: 20.00    Pack years: 6.00    Types: Cigarettes  . Smokeless tobacco: Never Used  Substance and Sexual Activity  . Alcohol use: No  . Drug use: No  . Sexual activity: Not on file  Other Topics Concern  . Not on file  Social History Narrative  . Not on file   Social Determinants of Health   Financial Resource Strain:   . Difficulty of Paying Living Expenses:   Food Insecurity:   . Worried About Charity fundraiser in the  Last Year:   . Arboriculturist in the Last Year:   Transportation Needs:   . Film/video editor (Medical):   Marland Kitchen Lack of Transportation (Non-Medical):   Physical Activity:   . Days of Exercise per Week:   . Minutes of Exercise per Session:   Stress:   . Feeling of Stress :   Social Connections:   . Frequency of Communication with Friends and Family:   . Frequency of Social Gatherings with Friends and Family:   . Attends Religious Services:   . Active Member of Clubs or Organizations:   . Attends Archivist Meetings:   Marland Kitchen Marital Status:   Intimate Partner Violence:   . Fear of Current or Ex-Partner:   . Emotionally Abused:   Marland Kitchen Physically Abused:   . Sexually Abused:     FAMILY HISTORY:  Family History  Problem Relation Age of Onset  . Cancer Sister   . Colon cancer Neg Hx     CURRENT MEDICATIONS:  Outpatient Encounter Medications as of 03/26/2020  Medication Sig  . chlorhexidine (PERIDEX) 0.12 % solution SMARTSIG:15 Milliliter(s) By Mouth Twice a Week  . dexlansoprazole (DEXILANT) 60 MG capsule 1 PO EVERY MORNING WITH BREAKFAST.  Marland Kitchen dronabinol (MARINOL) 2.5 MG capsule Take 1 capsule (2.5 mg total) by mouth 2 (two) times daily before lunch and supper.  Marland Kitchen ENSURE (ENSURE) Take 1 Can by mouth 4 (four) times daily.  . Fluticasone-Salmeterol (ADVAIR DISKUS) 500-50 MCG/DOSE AEPB Inhale 1 puff into the lungs 2 (two) times daily.  Marland Kitchen HYDROcodone-acetaminophen (NORCO/VICODIN) 5-325 MG tablet Take 1 tablet by mouth every 6 (six) hours as needed. for pain  . ibuprofen (ADVIL,MOTRIN) 200 MG tablet Take 400 mg by mouth every 8 (eight) hours as needed (for pain.).  Marland Kitchen levocetirizine (XYZAL) 5 MG tablet TAKE 1 TABLET BY MOUTH AT BEDTIME FOR ALLERGIES COUGH  . levothyroxine (SYNTHROID) 25 MCG tablet TAKE 1/2 (ONE-HALF) TABLET BY MOUTH ONCE DAILY BEFORE BREAKFAST  . lidocaine-prilocaine (EMLA) cream APPLY A QUARTER SIZE AMOUNT TO PORT SITE 1 HOUR PRIOR TO CHEMO.  DO NOT RUB IN.  COVER  WITH PLASTIC WRAP  . magic mouthwash SOLN Take 5 mLs by mouth 4 (four) times daily as needed for mouth pain.  . Nivolumab (OPDIVO IV) Inject into the vein every 28 (twenty-eight) days.  Marland Kitchen omeprazole (PRILOSEC) 40 MG capsule TAKE 1 CAPSULE BY MOUTH ONCE DAILY. TAKE 30 MINUTES PRIOR TO YOUR FIRST MEAL.  Marland Kitchen ondansetron (ZOFRAN) 8 MG tablet Take 1 tablet (8 mg total) by mouth every 8 (eight) hours as needed for nausea or vomiting.  . Oxycodone HCl 10 MG TABS Take 1-2 tablets (10-20 mg total) by mouth every 12 (twelve) hours as needed (pain.).  Marland Kitchen simvastatin (ZOCOR) 40 MG tablet Take 40 mg by mouth daily.   Facility-Administered Encounter  Medications as of 03/26/2020  Medication  . sodium chloride flush (NS) 0.9 % injection 10 mL    ALLERGIES:  Allergies  Allergen Reactions  . Xgeva [Denosumab]     osteonecrosis  . Omeprazole     Made legs hurt     PHYSICAL EXAM:  ECOG Performance status: 1  There were no vitals filed for this visit. There were no vitals filed for this visit.  Physical Exam Constitutional:      Appearance: Normal appearance. She is normal weight.  Cardiovascular:     Rate and Rhythm: Normal rate and regular rhythm.     Heart sounds: Normal heart sounds.  Pulmonary:     Effort: Pulmonary effort is normal.     Breath sounds: Normal breath sounds.  Abdominal:     General: There is no distension.     Palpations: Abdomen is soft. There is no mass.  Musculoskeletal:        General: Normal range of motion.  Skin:    General: Skin is warm and dry.  Neurological:     Mental Status: She is alert and oriented to person, place, and time. Mental status is at baseline.  Psychiatric:        Mood and Affect: Mood normal.        Behavior: Behavior normal.      LABORATORY DATA:  I have reviewed the labs as listed.  CBC    Component Value Date/Time   WBC 8.6 03/26/2020 0945   RBC 4.38 03/26/2020 0945   HGB 13.2 03/26/2020 0945   HCT 41.8 03/26/2020 0945   PLT 285  03/26/2020 0945   MCV 95.4 03/26/2020 0945   MCH 30.1 03/26/2020 0945   MCHC 31.6 03/26/2020 0945   RDW 14.7 03/26/2020 0945   LYMPHSABS 2.5 03/26/2020 0945   MONOABS 0.6 03/26/2020 0945   EOSABS 0.3 03/26/2020 0945   BASOSABS 0.0 03/26/2020 0945   CMP Latest Ref Rng & Units 03/26/2020 02/27/2020 01/31/2020  Glucose 70 - 99 mg/dL 86 95 113(H)  BUN 8 - 23 mg/dL 14 11 9   Creatinine 0.44 - 1.00 mg/dL 1.14(H) 1.16(H) 1.21(H)  Sodium 135 - 145 mmol/L 137 140 138  Potassium 3.5 - 5.1 mmol/L 3.9 4.0 4.3  Chloride 98 - 111 mmol/L 104 104 104  CO2 22 - 32 mmol/L 26 27 26   Calcium 8.9 - 10.3 mg/dL 9.3 9.9 9.5  Total Protein 6.5 - 8.1 g/dL 7.9 7.2 7.8  Total Bilirubin 0.3 - 1.2 mg/dL 0.4 0.5 0.5  Alkaline Phos 38 - 126 U/L 80 65 65  AST 15 - 41 U/L 15 15 16   ALT 0 - 44 U/L 11 10 11        DIAGNOSTIC IMAGING:  I have reviewed scans.     ASSESSMENT & PLAN:   Cancer of upper lobe of right lung 1.  Metastatic adenocarcinoma of the lung to the bones and right adrenal: -Foundation 1 testing shows 14 genomic alterations, no approved therapies. -Opdivo 480 mg monthly started on February 25, 2015. -CT CAP on February 21, 2020 shows no evidence of progression or metastatic disease.  T2 sclerotic lesion is stable. -We reviewed her labs.  LFTs are normal.  TSH is 2.23. -She will proceed with Opdivo today.  She will be reevaluated in 4 weeks.  2.  Back pain and leg pain: -She is taking 1-2 oxycodone tablets daily as needed.  Pain is well controlled.  3.  Appetite: -Continue Marinol 5 mg twice daily.  Weight has been stable.  4.  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 -Denosumab held indefinitely.   Orders placed this encounter:  Orders Placed This Encounter  Procedures  . CBC with Differential  . Comprehensive metabolic panel  . TSH      Derek Jack, [place] [phone]

## 2020-03-27 NOTE — Assessment & Plan Note (Signed)
1.  Metastatic adenocarcinoma of the lung to the bones and right adrenal: -Foundation 1 testing shows 14 genomic alterations, no approved therapies. -Opdivo 480 mg monthly started on February 25, 2015. -CT CAP on February 21, 2020 shows no evidence of progression or metastatic disease.  T2 sclerotic lesion is stable. -We reviewed her labs.  LFTs are normal.  TSH is 2.23. -She will proceed with Opdivo today.  She will be reevaluated in 4 weeks.  2.  Back pain and leg pain: -She is taking 1-2 oxycodone tablets daily as needed.  Pain is well controlled.  3.  Appetite: -Continue Marinol 5 mg twice daily.  Weight has been stable.  4.  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 -Denosumab held indefinitely.

## 2020-03-30 DIAGNOSIS — N1831 Chronic kidney disease, stage 3a: Secondary | ICD-10-CM | POA: Diagnosis not present

## 2020-03-30 DIAGNOSIS — M546 Pain in thoracic spine: Secondary | ICD-10-CM | POA: Diagnosis not present

## 2020-03-30 DIAGNOSIS — M87180 Osteonecrosis due to drugs, jaw: Secondary | ICD-10-CM | POA: Diagnosis not present

## 2020-03-30 DIAGNOSIS — I700 Atherosclerosis of aorta: Secondary | ICD-10-CM | POA: Diagnosis not present

## 2020-03-30 DIAGNOSIS — C799 Secondary malignant neoplasm of unspecified site: Secondary | ICD-10-CM | POA: Diagnosis not present

## 2020-03-30 DIAGNOSIS — E440 Moderate protein-calorie malnutrition: Secondary | ICD-10-CM | POA: Diagnosis not present

## 2020-03-30 DIAGNOSIS — R946 Abnormal results of thyroid function studies: Secondary | ICD-10-CM | POA: Diagnosis not present

## 2020-03-30 DIAGNOSIS — R634 Abnormal weight loss: Secondary | ICD-10-CM | POA: Diagnosis not present

## 2020-03-30 DIAGNOSIS — C3490 Malignant neoplasm of unspecified part of unspecified bronchus or lung: Secondary | ICD-10-CM | POA: Diagnosis not present

## 2020-04-21 NOTE — Progress Notes (Signed)

## 2020-04-23 ENCOUNTER — Inpatient Hospital Stay (HOSPITAL_COMMUNITY): Payer: Medicare HMO

## 2020-04-23 ENCOUNTER — Encounter (HOSPITAL_COMMUNITY): Payer: Self-pay

## 2020-04-23 ENCOUNTER — Other Ambulatory Visit: Payer: Self-pay

## 2020-04-23 ENCOUNTER — Inpatient Hospital Stay (HOSPITAL_COMMUNITY): Payer: Medicare HMO | Attending: Hematology

## 2020-04-23 VITALS — BP 113/58 | HR 75 | Temp 98.1°F | Resp 18 | Wt 102.8 lb

## 2020-04-23 DIAGNOSIS — C3411 Malignant neoplasm of upper lobe, right bronchus or lung: Secondary | ICD-10-CM | POA: Diagnosis present

## 2020-04-23 DIAGNOSIS — Z5112 Encounter for antineoplastic immunotherapy: Secondary | ICD-10-CM | POA: Diagnosis present

## 2020-04-23 DIAGNOSIS — C7951 Secondary malignant neoplasm of bone: Secondary | ICD-10-CM | POA: Insufficient documentation

## 2020-04-23 DIAGNOSIS — Z923 Personal history of irradiation: Secondary | ICD-10-CM | POA: Diagnosis not present

## 2020-04-23 DIAGNOSIS — M79606 Pain in leg, unspecified: Secondary | ICD-10-CM | POA: Diagnosis not present

## 2020-04-23 DIAGNOSIS — M549 Dorsalgia, unspecified: Secondary | ICD-10-CM | POA: Diagnosis not present

## 2020-04-23 DIAGNOSIS — F17210 Nicotine dependence, cigarettes, uncomplicated: Secondary | ICD-10-CM | POA: Diagnosis not present

## 2020-04-23 DIAGNOSIS — M87180 Osteonecrosis due to drugs, jaw: Secondary | ICD-10-CM | POA: Diagnosis not present

## 2020-04-23 DIAGNOSIS — R69 Illness, unspecified: Secondary | ICD-10-CM | POA: Diagnosis not present

## 2020-04-23 DIAGNOSIS — C7971 Secondary malignant neoplasm of right adrenal gland: Secondary | ICD-10-CM | POA: Diagnosis not present

## 2020-04-23 DIAGNOSIS — Z79899 Other long term (current) drug therapy: Secondary | ICD-10-CM | POA: Insufficient documentation

## 2020-04-23 DIAGNOSIS — E278 Other specified disorders of adrenal gland: Secondary | ICD-10-CM

## 2020-04-23 DIAGNOSIS — Z9221 Personal history of antineoplastic chemotherapy: Secondary | ICD-10-CM | POA: Insufficient documentation

## 2020-04-23 LAB — COMPREHENSIVE METABOLIC PANEL
ALT: 10 U/L (ref 0–44)
AST: 14 U/L — ABNORMAL LOW (ref 15–41)
Albumin: 3.9 g/dL (ref 3.5–5.0)
Alkaline Phosphatase: 73 U/L (ref 38–126)
Anion gap: 10 (ref 5–15)
BUN: 14 mg/dL (ref 8–23)
CO2: 25 mmol/L (ref 22–32)
Calcium: 9.5 mg/dL (ref 8.9–10.3)
Chloride: 105 mmol/L (ref 98–111)
Creatinine, Ser: 1.19 mg/dL — ABNORMAL HIGH (ref 0.44–1.00)
GFR calc Af Amer: 56 mL/min — ABNORMAL LOW (ref >60)
GFR calc non Af Amer: 49 mL/min — ABNORMAL LOW (ref >60)
Glucose, Bld: 95 mg/dL (ref 70–99)
Potassium: 3.9 mmol/L (ref 3.5–5.1)
Sodium: 140 mmol/L (ref 135–145)
Total Bilirubin: 0.2 mg/dL — ABNORMAL LOW (ref 0.3–1.2)
Total Protein: 7.5 g/dL (ref 6.5–8.1)

## 2020-04-23 LAB — CBC WITH DIFFERENTIAL/PLATELET
Abs Immature Granulocytes: 0.02 10*3/uL (ref 0.00–0.07)
Basophils Absolute: 0 10*3/uL (ref 0.0–0.1)
Basophils Relative: 0 %
Eosinophils Absolute: 0.3 10*3/uL (ref 0.0–0.5)
Eosinophils Relative: 3 %
HCT: 42.8 % (ref 36.0–46.0)
Hemoglobin: 13.6 g/dL (ref 12.0–15.0)
Immature Granulocytes: 0 %
Lymphocytes Relative: 32 %
Lymphs Abs: 3 10*3/uL (ref 0.7–4.0)
MCH: 30.4 pg (ref 26.0–34.0)
MCHC: 31.8 g/dL (ref 30.0–36.0)
MCV: 95.5 fL (ref 80.0–100.0)
Monocytes Absolute: 0.7 10*3/uL (ref 0.1–1.0)
Monocytes Relative: 7 %
Neutro Abs: 5.4 10*3/uL (ref 1.7–7.7)
Neutrophils Relative %: 58 %
Platelets: 296 10*3/uL (ref 150–400)
RBC: 4.48 MIL/uL (ref 3.87–5.11)
RDW: 14.9 % (ref 11.5–15.5)
WBC: 9.5 10*3/uL (ref 4.0–10.5)
nRBC: 0 % (ref 0.0–0.2)

## 2020-04-23 LAB — TSH: TSH: 2.86 u[IU]/mL (ref 0.350–4.500)

## 2020-04-23 MED ORDER — SODIUM CHLORIDE 0.9 % IV SOLN
480.0000 mg | Freq: Once | INTRAVENOUS | Status: AC
  Administered 2020-04-23: 480 mg via INTRAVENOUS
  Filled 2020-04-23: qty 48

## 2020-04-23 MED ORDER — HEPARIN SOD (PORK) LOCK FLUSH 100 UNIT/ML IV SOLN
500.0000 [IU] | Freq: Once | INTRAVENOUS | Status: AC | PRN
  Administered 2020-04-23: 500 [IU]

## 2020-04-23 MED ORDER — SODIUM CHLORIDE 0.9 % IV SOLN: Freq: Once | INTRAVENOUS | Status: AC

## 2020-05-10 ENCOUNTER — Other Ambulatory Visit (HOSPITAL_COMMUNITY): Payer: Self-pay | Admitting: Nurse Practitioner

## 2020-05-10 DIAGNOSIS — E032 Hypothyroidism due to medicaments and other exogenous substances: Secondary | ICD-10-CM

## 2020-05-19 ENCOUNTER — Inpatient Hospital Stay (HOSPITAL_COMMUNITY): Payer: Medicare HMO

## 2020-05-19 ENCOUNTER — Other Ambulatory Visit: Payer: Self-pay

## 2020-05-19 ENCOUNTER — Inpatient Hospital Stay (HOSPITAL_BASED_OUTPATIENT_CLINIC_OR_DEPARTMENT_OTHER): Payer: Medicare HMO | Admitting: Oncology

## 2020-05-19 VITALS — BP 140/75 | HR 92 | Temp 98.5°F | Resp 18 | Wt 101.9 lb

## 2020-05-19 VITALS — BP 150/71 | HR 89 | Temp 97.0°F | Resp 18

## 2020-05-19 DIAGNOSIS — D630 Anemia in neoplastic disease: Secondary | ICD-10-CM | POA: Diagnosis not present

## 2020-05-19 DIAGNOSIS — C3411 Malignant neoplasm of upper lobe, right bronchus or lung: Secondary | ICD-10-CM | POA: Diagnosis not present

## 2020-05-19 DIAGNOSIS — E278 Other specified disorders of adrenal gland: Secondary | ICD-10-CM

## 2020-05-19 DIAGNOSIS — Z5112 Encounter for antineoplastic immunotherapy: Secondary | ICD-10-CM | POA: Diagnosis not present

## 2020-05-19 DIAGNOSIS — C7951 Secondary malignant neoplasm of bone: Secondary | ICD-10-CM

## 2020-05-19 DIAGNOSIS — Z720 Tobacco use: Secondary | ICD-10-CM | POA: Diagnosis not present

## 2020-05-19 DIAGNOSIS — E032 Hypothyroidism due to medicaments and other exogenous substances: Secondary | ICD-10-CM

## 2020-05-19 LAB — COMPREHENSIVE METABOLIC PANEL
ALT: 13 U/L (ref 0–44)
AST: 17 U/L (ref 15–41)
Albumin: 4.1 g/dL (ref 3.5–5.0)
Alkaline Phosphatase: 69 U/L (ref 38–126)
Anion gap: 9 (ref 5–15)
BUN: 12 mg/dL (ref 8–23)
CO2: 24 mmol/L (ref 22–32)
Calcium: 9.2 mg/dL (ref 8.9–10.3)
Chloride: 106 mmol/L (ref 98–111)
Creatinine, Ser: 1.17 mg/dL — ABNORMAL HIGH (ref 0.44–1.00)
GFR calc Af Amer: 57 mL/min — ABNORMAL LOW (ref >60)
GFR calc non Af Amer: 50 mL/min — ABNORMAL LOW (ref >60)
Glucose, Bld: 91 mg/dL (ref 70–99)
Potassium: 3.9 mmol/L (ref 3.5–5.1)
Sodium: 139 mmol/L (ref 135–145)
Total Bilirubin: 0.5 mg/dL (ref 0.3–1.2)
Total Protein: 7.8 g/dL (ref 6.5–8.1)

## 2020-05-19 LAB — CBC WITH DIFFERENTIAL/PLATELET
Abs Immature Granulocytes: 0.03 10*3/uL (ref 0.00–0.07)
Basophils Absolute: 0.1 10*3/uL (ref 0.0–0.1)
Basophils Relative: 1 %
Eosinophils Absolute: 0.2 10*3/uL (ref 0.0–0.5)
Eosinophils Relative: 2 %
HCT: 43.3 % (ref 36.0–46.0)
Hemoglobin: 13.9 g/dL (ref 12.0–15.0)
Immature Granulocytes: 0 %
Lymphocytes Relative: 31 %
Lymphs Abs: 2.7 10*3/uL (ref 0.7–4.0)
MCH: 30.7 pg (ref 26.0–34.0)
MCHC: 32.1 g/dL (ref 30.0–36.0)
MCV: 95.6 fL (ref 80.0–100.0)
Monocytes Absolute: 0.7 10*3/uL (ref 0.1–1.0)
Monocytes Relative: 7 %
Neutro Abs: 5.1 10*3/uL (ref 1.7–7.7)
Neutrophils Relative %: 59 %
Platelets: 289 10*3/uL (ref 150–400)
RBC: 4.53 MIL/uL (ref 3.87–5.11)
RDW: 15.3 % (ref 11.5–15.5)
WBC: 8.8 10*3/uL (ref 4.0–10.5)
nRBC: 0 % (ref 0.0–0.2)

## 2020-05-19 LAB — TSH: TSH: 1.688 u[IU]/mL (ref 0.350–4.500)

## 2020-05-19 MED ORDER — SODIUM CHLORIDE 0.9 % IV SOLN
480.0000 mg | Freq: Once | INTRAVENOUS | Status: AC
  Administered 2020-05-19: 480 mg via INTRAVENOUS
  Filled 2020-05-19: qty 48

## 2020-05-19 MED ORDER — SODIUM CHLORIDE 0.9% FLUSH: 10.0000 mL | INTRAVENOUS | Status: DC | PRN

## 2020-05-19 MED ORDER — OXYCODONE HCL 10 MG PO TABS
10.0000 mg | ORAL_TABLET | Freq: Two times a day (BID) | ORAL | 0 refills | Status: DC | PRN
Start: 2020-05-19 — End: 2020-11-05

## 2020-05-19 MED ORDER — SODIUM CHLORIDE 0.9 % IV SOLN: Freq: Once | INTRAVENOUS | Status: AC

## 2020-05-19 MED ORDER — HEPARIN SOD (PORK) LOCK FLUSH 100 UNIT/ML IV SOLN
500.0000 [IU] | Freq: Once | INTRAVENOUS | Status: AC | PRN
  Administered 2020-05-19: 500 [IU]

## 2020-05-19 NOTE — Progress Notes (Signed)
Labs reviewed with Kirby Crigler PA.  Okay for treatment today.   Tolerated treatment well today. Discharged ambulatory.

## 2020-05-19 NOTE — Progress Notes (Signed)
Patient has been assessed, vital signs and labs have been reviewed by Kirby Crigler PA. ANC, Creatinine, LFTs, and Platelets are within treatment parameters per Kirby Crigler PA. The patient is good to proceed with treatment at this time.

## 2020-05-19 NOTE — Progress Notes (Signed)
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 -Opdivo 480 mg monthly started on February 25, 2015. -CT CAP on February 21, 2020 shows no evidence of progression or metastatic disease.  T2 sclerotic lesion is stable.  Not currently on bone targeted therapy. AND ONJ- bone targeted therapy discontinued with last dose administered on 05/18/2018.  CURRENT STATUS: Melissa Sullivan 64 y.o. female returns for followup of metastatic adenocarcinoma of lung to bones and right adrenal gland, currently on therapy monthly and tolerating well without hyperimmune toxicity.  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  Bone targeted therapy has therefore been discontinued altogether.  She denies any new lumps or bumps on her examination.  No new pain.  Appetite is good and weight is stable.  She denies any new cough or hemoptysis.  No new neurological deficits including headaches, dizziness, double vision, LOC, and seizure.  She continues to smoke 1/4 pack/day of cigarettes and I provide her smoking cessation education.  She anticipates quitting smoking altogether with her niece at the same time and they will rely on each other for support system.  Review of Systems  Constitutional: Negative.  Negative for chills, fever and weight loss.  HENT: Negative.   Eyes: Negative.   Respiratory: Negative.  Negative for cough.   Cardiovascular: Negative.  Negative for chest pain.  Gastrointestinal: Negative.  Negative for blood in stool, constipation, diarrhea, melena, nausea and  vomiting.  Genitourinary: Negative.   Musculoskeletal: Negative.   Skin: Negative.   Neurological: Negative.  Negative for weakness.  Endo/Heme/Allergies: Negative.   Psychiatric/Behavioral: Positive for substance abuse (cigarette smoking, 1/4 ppd.).    Past Medical History:  Diagnosis Date  . Adrenal mass, right ([place]) 07/28/2014  . Anemia   . Bone metastases ([place]) 04/05/2016  . Cancer ([place])    lung  right  . Diabetes mellitus without complication ([place])   . GERD (gastroesophageal reflux disease)   . Hyperlipidemia   . Hypertension   . Hypothyroidism due to medication 01/30/2017  . Lung mass 07/28/2014  . Reflux      PHYSICAL EXAMINATION  ECOG PERFORMANCE STATUS: 0 - Asymptomatic  Vitals:   05/19/20 1000  BP: 140/75  Pulse: 92  Resp: 18  Temp: 98.5 F (36.9 C)  SpO2: 98%    GENERAL:alert, healthy, no distress, well nourished, well developed, comfortable, cooperative, smiling and unaccompanied SKIN: skin color, texture, turgor are normal, no rashes or significant lesions HEAD: Normocephalic, No masses, lesions, tenderness or abnormalities EYES: normal, EOMI, Conjunctiva are pink and non-injected EARS: External ears normal OROPHARYNX: Not examined, mask in place NECK: supple, no adenopathy LYMPH:  no palpable lymphadenopathy BREAST:not examined LUNGS: clear to auscultation  HEART: regular rate & rhythm, no murmurs and no gallops ABDOMEN:abdomen soft, non-tender and normal bowel sounds BACK: Back symmetric, no curvature. EXTREMITIES:less then 2 second capillary refill, no joint deformities, effusion, or inflammation, no edema, no skin discoloration, no cyanosis  NEURO: alert & oriented x 3 with fluent speech, no focal motor/sensory deficits, gait normal   LABORATORY DATA:  CBC    Component Value Date/Time   WBC 8.8 05/19/2020 0918   RBC 4.53 05/19/2020 0918   HGB 13.9 05/19/2020 0918   HCT 43.3 05/19/2020 0918   PLT 289 05/19/2020 0918   MCV 95.6 05/19/2020 0918    MCH 30.7 05/19/2020 0918   MCHC 32.1 05/19/2020 0918   RDW 15.3 05/19/2020 0918   LYMPHSABS 2.7 05/19/2020 0918   MONOABS 0.7 05/19/2020 0918   EOSABS 0.2 05/19/2020 0918   BASOSABS 0.1 05/19/2020 0918      Chemistry      Component Value Date/Time   NA 139 05/19/2020 0918   K 3.9 05/19/2020 0918   CL 106 05/19/2020 0918   CO2 24 05/19/2020 0918   BUN 12 05/19/2020 0918   CREATININE 1.17 (H) 05/19/2020 0918      Component Value Date/Time   CALCIUM 9.2 05/19/2020 0918   CALCIUM 9.7 06/03/2015 1000   ALKPHOS 69 05/19/2020 0918   AST 17 05/19/2020 0918   ALT 13 05/19/2020 0918   BILITOT 0.5 05/19/2020 0918       RADIOGRAPHIC STUDIES:  No results found.     ASSESSMENT AND PLAN:  1. 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 -Opdivo 480 mg monthly started on February 25, 2015. -CT CAP on February 21, 2020 shows no evidence of progression or metastatic disease.  T2 sclerotic lesion is stable.  Labs updated today: WBC 8.8 with normal differential, hemoglobin 13.9 g/dL, platelet count 289,000.  Metabolic panel unimpressive and creatinine is very stable.  TSH within normal limits as well.  Labs satisfy treatment parameters.  Nursing, in accordance with immunotherapy administration protocol, will monitor for acute side effects/toxicities associated with immunotherapy administration today.  She is not currently on bone targeted therapy due to complications associated with ONJ..  All previous oncology related documentation and imaging reviewed.  Return in 4 weeks for follow-up and ongoing treatment.  Will plan on re-staging scans in 08/2020, sooner if clinically indicated.  2. Anemia in neoplastic disease Hemoglobin within normal limits  3. Hypothyroidism due to medication On Euthyrox therapy.  TSH within normal limits  4. Bone  metastases ([place]) Not currently on bone targeted therapy due to development on ONJ in 2019.  5. Hypercalcemia Seen within normal is today.  6. Tobacco use Smoking 1/4 ppd- smoking cessation education provided and she is strongly urged to quit altogether.   ORDERS PLACED FOR THIS ENCOUNTER: No orders of the defined types were placed in this encounter.   MEDICATIONS PRESCRIBED THIS ENCOUNTER: No orders of the defined types were placed in this encounter.   All questions were answered. The patient knows to call the clinic with any problems, questions or concerns. We can certainly see the patient much sooner if necessary.  Patient and plan discussed with Dr. Derek Jack and he is in agreement with the aforementioned.   This note is electronically signed by: Robynn Pane, PA-C 05/19/2020 10:47 AM

## 2020-05-19 NOTE — Patient Instructions (Signed)
[facility] Discharge Instructions for Patients Receiving Chemotherapy  Today you received the following chemotherapy agents   To help prevent nausea and vomiting after your treatment, we encourage you to take your nausea medication   If you develop nausea and vomiting that is not controlled by your nausea medication, call the clinic.   BELOW ARE SYMPTOMS THAT SHOULD BE REPORTED IMMEDIATELY:  *FEVER GREATER THAN 100.5 F  *CHILLS WITH OR WITHOUT FEVER  NAUSEA AND VOMITING THAT IS NOT CONTROLLED WITH YOUR NAUSEA MEDICATION  *UNUSUAL SHORTNESS OF BREATH  *UNUSUAL BRUISING OR BLEEDING  TENDERNESS IN MOUTH AND THROAT WITH OR WITHOUT PRESENCE OF ULCERS  *URINARY PROBLEMS  *BOWEL PROBLEMS  UNUSUAL RASH Items with * indicate a potential emergency and should be followed up as soon as possible.  Feel free to call the clinic should you have any questions or concerns. The clinic phone number is [phone].  Please show the CHEMO ALERT CARD at check-in to the Emergency Department and triage nurse.   

## 2020-05-19 NOTE — Patient Instructions (Signed)
[place] at [facility] Discharge Instructions  You were seen today by Kirby Crigler PA. He went over your recent lab results. He will see you back in 4 weeks for labs, treatment and follow up.   Thank you for choosing [place] at [facility] to provide your oncology and hematology care.  To afford each patient quality time with our provider, please arrive at least 15 minutes before your scheduled appointment time.   If you have a lab appointment with the [place] please come in thru the  Main Entrance and check in at the main information desk  You need to re-schedule your appointment should you arrive 10 or more minutes late.  We strive to give you quality time with our providers, and arriving late affects you and other patients whose appointments are after yours.  Also, if you no show three or more times for appointments you may be dismissed from the clinic at the providers discretion.     Again, thank you for choosing [facility].  Our hope is that these requests will decrease the amount of time that you wait before being seen by our physicians.       _____________________________________________________________  Should you have questions after your visit to [facility] please contact our office at [phone] between the hours of 8:00 a.m. and 4:30 p.m.  Voicemails left after 4:00 p.m. will not be returned until the following business day.  For prescription refill requests, have your pharmacy contact our office and allow 72 hours.    Cancer Center Support Programs:   > Cancer Support Group  2nd Tuesday of the month 1pm-2pm, Journey Room

## 2020-05-19 NOTE — Addendum Note (Signed)
Addended by: Baird Cancer on: 05/19/2020 03:42 PM   Modules accepted: Orders

## 2020-05-29 DIAGNOSIS — B351 Tinea unguium: Secondary | ICD-10-CM | POA: Diagnosis not present

## 2020-05-29 DIAGNOSIS — C3490 Malignant neoplasm of unspecified part of unspecified bronchus or lung: Secondary | ICD-10-CM | POA: Diagnosis not present

## 2020-05-29 DIAGNOSIS — G629 Polyneuropathy, unspecified: Secondary | ICD-10-CM | POA: Diagnosis not present

## 2020-06-18 ENCOUNTER — Other Ambulatory Visit (HOSPITAL_COMMUNITY): Payer: Self-pay | Admitting: Internal Medicine

## 2020-06-18 ENCOUNTER — Inpatient Hospital Stay (HOSPITAL_COMMUNITY): Payer: Medicare HMO

## 2020-06-18 ENCOUNTER — Inpatient Hospital Stay (HOSPITAL_BASED_OUTPATIENT_CLINIC_OR_DEPARTMENT_OTHER): Payer: Medicare HMO | Admitting: Hematology

## 2020-06-18 ENCOUNTER — Other Ambulatory Visit: Payer: Self-pay

## 2020-06-18 ENCOUNTER — Inpatient Hospital Stay (HOSPITAL_COMMUNITY): Payer: Medicare HMO | Attending: Hematology

## 2020-06-18 VITALS — BP 131/71 | HR 81 | Temp 96.8°F | Resp 18

## 2020-06-18 VITALS — BP 152/79 | HR 84 | Temp 97.1°F | Resp 18 | Wt 102.7 lb

## 2020-06-18 DIAGNOSIS — Z9221 Personal history of antineoplastic chemotherapy: Secondary | ICD-10-CM | POA: Insufficient documentation

## 2020-06-18 DIAGNOSIS — C3411 Malignant neoplasm of upper lobe, right bronchus or lung: Secondary | ICD-10-CM | POA: Insufficient documentation

## 2020-06-18 DIAGNOSIS — R69 Illness, unspecified: Secondary | ICD-10-CM | POA: Diagnosis not present

## 2020-06-18 DIAGNOSIS — C7951 Secondary malignant neoplasm of bone: Secondary | ICD-10-CM | POA: Insufficient documentation

## 2020-06-18 DIAGNOSIS — F17210 Nicotine dependence, cigarettes, uncomplicated: Secondary | ICD-10-CM | POA: Insufficient documentation

## 2020-06-18 DIAGNOSIS — C7971 Secondary malignant neoplasm of right adrenal gland: Secondary | ICD-10-CM | POA: Insufficient documentation

## 2020-06-18 DIAGNOSIS — E278 Other specified disorders of adrenal gland: Secondary | ICD-10-CM

## 2020-06-18 DIAGNOSIS — Z79899 Other long term (current) drug therapy: Secondary | ICD-10-CM | POA: Diagnosis not present

## 2020-06-18 DIAGNOSIS — Z5112 Encounter for antineoplastic immunotherapy: Secondary | ICD-10-CM | POA: Insufficient documentation

## 2020-06-18 DIAGNOSIS — Z1231 Encounter for screening mammogram for malignant neoplasm of breast: Secondary | ICD-10-CM

## 2020-06-18 DIAGNOSIS — Z923 Personal history of irradiation: Secondary | ICD-10-CM | POA: Insufficient documentation

## 2020-06-18 LAB — CBC WITH DIFFERENTIAL/PLATELET
Abs Immature Granulocytes: 0.04 10*3/uL (ref 0.00–0.07)
Basophils Absolute: 0.1 10*3/uL (ref 0.0–0.1)
Basophils Relative: 1 %
Eosinophils Absolute: 0.3 10*3/uL (ref 0.0–0.5)
Eosinophils Relative: 3 %
HCT: 42.2 % (ref 36.0–46.0)
Hemoglobin: 13.6 g/dL (ref 12.0–15.0)
Immature Granulocytes: 0 %
Lymphocytes Relative: 31 %
Lymphs Abs: 2.9 10*3/uL (ref 0.7–4.0)
MCH: 30.4 pg (ref 26.0–34.0)
MCHC: 32.2 g/dL (ref 30.0–36.0)
MCV: 94.4 fL (ref 80.0–100.0)
Monocytes Absolute: 0.7 10*3/uL (ref 0.1–1.0)
Monocytes Relative: 7 %
Neutro Abs: 5.3 10*3/uL (ref 1.7–7.7)
Neutrophils Relative %: 58 %
Platelets: 283 10*3/uL (ref 150–400)
RBC: 4.47 MIL/uL (ref 3.87–5.11)
RDW: 15.5 % (ref 11.5–15.5)
WBC: 9.2 10*3/uL (ref 4.0–10.5)
nRBC: 0 % (ref 0.0–0.2)

## 2020-06-18 LAB — COMPREHENSIVE METABOLIC PANEL
ALT: 10 U/L (ref 0–44)
AST: 16 U/L (ref 15–41)
Albumin: 4.2 g/dL (ref 3.5–5.0)
Alkaline Phosphatase: 70 U/L (ref 38–126)
Anion gap: 8 (ref 5–15)
BUN: 12 mg/dL (ref 8–23)
CO2: 25 mmol/L (ref 22–32)
Calcium: 9.4 mg/dL (ref 8.9–10.3)
Chloride: 105 mmol/L (ref 98–111)
Creatinine, Ser: 1.26 mg/dL — ABNORMAL HIGH (ref 0.44–1.00)
GFR calc Af Amer: 53 mL/min — ABNORMAL LOW (ref >60)
GFR calc non Af Amer: 45 mL/min — ABNORMAL LOW (ref >60)
Glucose, Bld: 64 mg/dL — ABNORMAL LOW (ref 70–99)
Potassium: 3.4 mmol/L — ABNORMAL LOW (ref 3.5–5.1)
Sodium: 138 mmol/L (ref 135–145)
Total Bilirubin: 0.5 mg/dL (ref 0.3–1.2)
Total Protein: 7.8 g/dL (ref 6.5–8.1)

## 2020-06-18 MED ORDER — HEPARIN SOD (PORK) LOCK FLUSH 100 UNIT/ML IV SOLN
500.0000 [IU] | Freq: Once | INTRAVENOUS | Status: AC | PRN
  Administered 2020-06-18: 500 [IU]

## 2020-06-18 MED ORDER — SODIUM CHLORIDE 0.9% FLUSH
10.0000 mL | INTRAVENOUS | Status: DC | PRN
  Administered 2020-06-18: 10 mL

## 2020-06-18 MED ORDER — SODIUM CHLORIDE 0.9 % IV SOLN: Freq: Once | INTRAVENOUS | Status: AC

## 2020-06-18 MED ORDER — SODIUM CHLORIDE 0.9 % IV SOLN
480.0000 mg | Freq: Once | INTRAVENOUS | Status: AC
  Administered 2020-06-18: 480 mg via INTRAVENOUS
  Filled 2020-06-18: qty 48

## 2020-06-18 NOTE — Progress Notes (Signed)
Melissa Sullivan, [place] [address]   CLINIC:  Medical Oncology/Hematology  PCP:  [name], MD [address] [place] [address] [phone]   REASON FOR VISIT:  Follow-up for Metastatic lung cancer  CURRENT THERAPY: Opidvo every 4 weeks  BRIEF ONCOLOGIC HISTORY:  Oncology History  Cancer of upper lobe of right lung ([place])  07/28/2014 Imaging   CT chest: Large R apical mass consistent with malignancy. This is destroying the R 2nd rib with extension into adjacent soft tissue. R hilar adenopathy with R 5cm adrenal metastatic lesion.   08/01/2014 Initial Biopsy   Lung, needle/core biopsy(ies), right upper lobe - POORLY DIFFERENTIATED ADENOCARCINOMA, SEE COMMENT.   08/08/2014 PET scan   Large hypermetabolic R apical mass with evidence of direct chest wall and mediastinal invasion, right retrocrural lymphadenopathy, extensive retroperitoneal lymphadenopathy, and metastatic lesions to the adrenal glands    09/02/2014 - 11/04/2014 Chemotherapy   Cisplatin/Pemetrexed/Avastin every 21 days x 4 cycles   10/07/2014 - 10/27/2014 Radiation Therapy   Right lung apex for control of brachioplexopathy.   12/24/2014 - 02/25/2015 Chemotherapy   Alimta/Avastin every 21 days.   02/20/2015 Imaging   Increase in size of right adrenal metastasis and subjacent confluent retrocaval lymphadenopathy   02/25/2015 -  Chemotherapy   Nivolumab, zometa   05/04/2015 Imaging   CT CAP- Stable to slight decrease in the posterior right apical lesion. Stable appearance of posterior right upper rib an upper thoracic bony lesions. Slight improvement in right upper lobe tree-in-bud opacity. No new or progressive findings in...   07/28/2015 Imaging   CT CAP- Reduced size of the right apical pleural parenchymal lesion and reduced size of the right adrenal metastatic lesion. Resolution of prior retrocrural adenopathy.  Right eccentric T1 and T2 sclerosis with sclerosis and tapering of  the right second..   11/17/2015 Imaging   CT CAP- Stable soft tissue thickening in the apex of the right hemi thorax. Stable right adrenal metastasis. Nodularity along the trachea and mainstem bronchi, relatively new from 07/28/2015, favoring adherent debris.   11/18/2015 Treatment Plan Change   Zometa HELD for upcoming tooth extraction   11/24/2015 Treatment Plan Change   Zometa on hold at this time in preparation for tooth extraction in March 2017.  Zometa las given on 11/18/2015.   02/03/2016 Imaging   CT CAP- Heterogeneous right apical masslike consolidation and right adrenal metastasis are unchanged   04/06/2016 Treatment Plan Change   Zometa restarted 6 weeks out from tooth extraction (04/06/2016)   05/12/2016 Imaging   CT CAP- NED in the chest, abdomen or pelvis.  Some areas of nodularity associated with the mainstem bronchi in the left upper lobe bronchus, favored to represent adherent inspissated secretions   08/17/2016 Imaging   CT CAP- 1. Stable CTs of the chest and abdomen. No evidence of progressive metastatic disease. 2. Probable treated tumor at the right apex, right adrenal gland and T2 vertebral body, stable. 3. Fluctuating nodularity along the walls of the trachea and mainstem bronchi, likely secretions.   12/12/2016 Imaging   Further decrease in size of treated tumor within the right apex. 2. Stable treated tumor involving the right second rib and T2 vertebra. 3. Stable right adrenal gland treated tumor. 4. Emphysema 5. Aortic atherosclerosis   01/04/2017 Imaging   MRI brain- Normal brain MRI.  No intracranial metastatic disease.   03/15/2017 Imaging   CT CAP- 1. No new or progressive metastatic disease in the  chest or abdomen. 2. Stable treated tumor in the apical right upper lobe. Stable treated right posterior second rib and right T2 vertebral lesions. Stable treated right adrenal metastasis. 3. Aortic atherosclerosis. 4. Moderate emphysema with mild diffuse  bronchial wall thickening, suggesting COPD.   06/12/2017 Imaging   CT CAP 1. Similar appearance of treated primary within the right apex. 2. Similar areas of sclerosis within the right second rib, T2, and less so T1 vertebral bodies. These are most consistent with treated metastasis. 3. Similar right adrenal treated metastasis. 4. No evidence of new or progressive disease. 5. Similar right and progressive left areas of bronchial wall thickening and probable mucoid impaction. Correlate with interval infectious symptoms. 6.  Emphysema (ICD10-J43.9). 7. Coronary artery atherosclerosis. Aortic Atherosclerosis (ICD10-I70.0).    10/16/2017 Imaging   CT CAP 1. Stable appearance of the prior Pancoast tumor and related bony findings in the right second rib and right T1 and T2 vertebra compatible with successfully treated tumor. No significant enlargement or new lesions are identified. Similarly the treated right adrenal metastatic lesion is stable in appearance. 2. Upper normal size right hilar lymph node may warrant surveillance. Currently 9 mm in short axis. 3. Other imaging findings of potential clinical significance: Aortic Atherosclerosis (ICD10-I70.0) and Emphysema (ICD10-J43.9). Scattered proximal sigmoid colon diverticula. Mucus plugging medially in the left upper lobe and posteriorly in the right upper lobe.      CANCER STAGING: Cancer Staging No matching staging information was found for the patient.  INTERVAL HISTORY:  Melissa Sullivan, a 64 y.o. female, returns for routine follow-up and consideration for next cycle of chemotherapy. Lavonn was last seen on 05/19/2020 with Robynn Pane, PA.  Due for her next cycle of Opidvo today.   Overall, she tells me she has been feeling pretty well. She notes that she is having some pain in her lower shoulder, but she thinks that it might be from the way that she slept.   Overall, she feels ready for next cycle of chemo  today.    REVIEW OF SYSTEMS:  Review of Systems  Constitutional: Positive for appetite change (mildly decreased).  HENT:   Positive for trouble swallowing.   Eyes: Negative.   Respiratory: Positive for cough.   Cardiovascular: Negative.   Gastrointestinal: Negative.   Endocrine: Negative.   Genitourinary: Negative.    Musculoskeletal: Negative.   Skin: Negative.   Neurological: Negative.   Hematological: Negative.   Psychiatric/Behavioral: Positive for sleep disturbance.  All other systems reviewed and are negative.   PAST MEDICAL/SURGICAL HISTORY:  Past Medical History:  Diagnosis Date  . Adrenal mass, right ([place]) 07/28/2014  . Anemia   . Bone metastases ([place]) 04/05/2016  . Cancer ([place])    lung  right  . Diabetes mellitus without complication ([place])   . GERD (gastroesophageal reflux disease)   . Hyperlipidemia   . Hypertension   . Hypothyroidism due to medication 01/30/2017  . Lung mass 07/28/2014  . Reflux    Past Surgical History:  Procedure Laterality Date  . AMPUTATION Right 02/22/2017   Procedure: PARTIAL AMPUTATION RIGHT GREAT TOE;  Surgeon: Caprice Beaver, DPM;  Location: [facility];  Service: Podiatry;  Laterality: Right;  . APPENDECTOMY    . ESOPHAGOGASTRODUODENOSCOPY N/A 12/09/2014   TJQ:[id number] esophageal stricture/mild-to-noderate erosive gastritis. negative H.pylori  . FLEXIBLE SIGMOIDOSCOPY  2011   Dr. [name]: hyperplastic polyp  . LUNG BIOPSY Right 07/2014   CT guided  . MALONEY DILATION N/A 12/09/2014   Procedure: MALONEY DILATION;  Surgeon: [name], MD;  Location: [facility];  Service: Endoscopy;  Laterality: N/A;  . PORTACATH PLACEMENT Left 09/01/2014  . SAVORY DILATION N/A 12/09/2014   Procedure: SAVORY DILATION;  Surgeon: [name], MD;  Location: [facility];  Service: Endoscopy;  Laterality: N/A;    SOCIAL HISTORY:  Social History   Socioeconomic History  . Marital status: Married    Spouse name: Not on file  . Number of children:  Not on file  . Years of education: Not on file  . Highest education level: Not on file  Occupational History  . Not on file  Tobacco Use  . Smoking status: Light Tobacco Smoker    Packs/day: 0.30    Years: 20.00    Pack years: 6.00    Types: Cigarettes  . Smokeless tobacco: Never Used  Vaping Use  . Vaping Use: Never used  Substance and Sexual Activity  . Alcohol use: No  . Drug use: No  . Sexual activity: Not on file  Other Topics Concern  . Not on file  Social History Narrative  . Not on file   Social Determinants of Health   Financial Resource Strain:   . Difficulty of Paying Living Expenses:   Food Insecurity:   . Worried About Charity fundraiser in the Last Year:   . Arboriculturist in the Last Year:   Transportation Needs:   . Film/video editor (Medical):   Marland Kitchen Lack of Transportation (Non-Medical):   Physical Activity:   . Days of Exercise per Week:   . Minutes of Exercise per Session:   Stress:   . Feeling of Stress :   Social Connections:   . Frequency of Communication with Friends and Family:   . Frequency of Social Gatherings with Friends and Family:   . Attends Religious Services:   . Active Member of Clubs or Organizations:   . Attends Archivist Meetings:   Marland Kitchen Marital Status:   Intimate Partner Violence:   . Fear of Current or Ex-Partner:   . Emotionally Abused:   Marland Kitchen Physically Abused:   . Sexually Abused:     FAMILY HISTORY:  Family History  Problem Relation Age of Onset  . Cancer Sister   . Colon cancer Neg Hx     CURRENT MEDICATIONS:  Current Outpatient Medications  Medication Sig Dispense Refill  . chlorhexidine (PERIDEX) 0.12 % solution SMARTSIG:15 Milliliter(s) By Mouth Twice a Week    . dexlansoprazole (DEXILANT) 60 MG capsule 1 PO EVERY MORNING WITH BREAKFAST. 30 capsule 11  . dronabinol (MARINOL) 2.5 MG capsule Take 1 capsule (2.5 mg total) by mouth 2 (two) times daily before lunch and supper. 60 capsule 2  . ENSURE  (ENSURE) Take 1 Can by mouth 4 (four) times daily.    Arna Medici 25 MCG tablet TAKE 1/2 (ONE-HALF) TABLET BY MOUTH ONCE DAILY BEFORE BREAKFAST 45 tablet 0  . Fluticasone-Salmeterol (ADVAIR DISKUS) 500-50 MCG/DOSE AEPB Inhale 1 puff into the lungs 2 (two) times daily. 60 each 6  . levocetirizine (XYZAL) 5 MG tablet TAKE 1 TABLET BY MOUTH AT BEDTIME FOR ALLERGIES COUGH    . Nivolumab (OPDIVO IV) Inject into the vein every 28 (twenty-eight) days.    Marland Kitchen omeprazole (PRILOSEC) 40 MG capsule TAKE 1 CAPSULE BY MOUTH ONCE DAILY. TAKE 30 MINUTES PRIOR TO YOUR FIRST MEAL. 90 capsule 1  . simvastatin (ZOCOR) 40 MG tablet Take 40 mg by mouth daily.    Marland Kitchen  ibuprofen (ADVIL,MOTRIN) 200 MG tablet Take 400 mg by mouth every 8 (eight) hours as needed (for pain.).  (Patient not taking: Reported on 06/18/2020)    . lidocaine-prilocaine (EMLA) cream APPLY A QUARTER SIZE AMOUNT TO PORT SITE 1 HOUR PRIOR TO CHEMO.  DO NOT RUB IN.  COVER WITH PLASTIC WRAP (Patient not taking: Reported on 06/18/2020) 30 g 5  . magic mouthwash SOLN Take 5 mLs by mouth 4 (four) times daily as needed for mouth pain. (Patient not taking: Reported on 06/18/2020) 480 mL 2  . ondansetron (ZOFRAN) 8 MG tablet Take 1 tablet (8 mg total) by mouth every 8 (eight) hours as needed for nausea or vomiting. (Patient not taking: Reported on 06/18/2020) 60 tablet 1  . Oxycodone HCl 10 MG TABS Take 1-2 tablets (10-20 mg total) by mouth every 12 (twelve) hours as needed (pain.). (Patient not taking: Reported on 06/18/2020) 60 tablet 0   No current facility-administered medications for this visit.   Facility-Administered Medications Ordered in Other Visits  Medication Dose Route Frequency Provider Last Rate Last Admin  . sodium chloride flush (NS) 0.9 % injection 10 mL  10 mL Intracatheter PRN Derek Jack, MD   10 mL at 01/03/19 0930    ALLERGIES:  Allergies  Allergen Reactions  . Xgeva [Denosumab]     osteonecrosis  . Omeprazole     Made legs hurt     PHYSICAL EXAM:  Performance status (ECOG): 0 - Asymptomatic  Vitals:   06/18/20 1022  BP: (!) 152/79  Pulse: 84  Resp: 18  Temp: (!) 97.1 F (36.2 C)  SpO2: 95%   Wt Readings from Last 3 Encounters:  06/18/20 102 lb 11.2 oz (46.6 kg)  05/19/20 101 lb 14.4 oz (46.2 kg)  04/23/20 102 lb 12.8 oz (46.6 kg)   Physical Exam Vitals and nursing note reviewed.  Constitutional:      Appearance: Normal appearance.  HENT:     Mouth/Throat:     Mouth: Mucous membranes are moist.  Eyes:     Pupils: Pupils are equal, round, and reactive to light.  Cardiovascular:     Rate and Rhythm: Normal rate and regular rhythm.     Pulses: Normal pulses.     Heart sounds: Normal heart sounds.  Pulmonary:     Effort: Pulmonary effort is normal.     Breath sounds: Normal breath sounds.  Abdominal:     Palpations: Abdomen is soft. There is no mass.     Tenderness: There is no abdominal tenderness.  Musculoskeletal:     Cervical back: Neck supple.     Right lower leg: No edema.     Left lower leg: No edema.  Neurological:     Mental Status: She is alert and oriented to person, place, and time.  Psychiatric:        Mood and Affect: Mood normal.        Behavior: Behavior normal.     LABORATORY DATA:  I have reviewed the labs as listed.  CBC Latest Ref Rng & Units 06/18/2020 05/19/2020 04/23/2020  WBC 4.0 - 10.5 K/uL 9.2 8.8 9.5  Hemoglobin 12.0 - 15.0 g/dL 13.6 13.9 13.6  Hematocrit 36 - 46 % 42.2 43.3 42.8  Platelets 150 - 400 K/uL 283 289 296   CMP Latest Ref Rng & Units 06/18/2020 05/19/2020 04/23/2020  Glucose 70 - 99 mg/dL 64(L) 91 95  BUN 8 - 23 mg/dL 12 12 14   Creatinine 0.44 - 1.00 mg/dL 1.26(H) 1.17(H)  1.19(H)  Sodium 135 - 145 mmol/L 138 139 140  Potassium 3.5 - 5.1 mmol/L 3.4(L) 3.9 3.9  Chloride 98 - 111 mmol/L 105 106 105  CO2 22 - 32 mmol/L 25 24 25   Calcium 8.9 - 10.3 mg/dL 9.4 9.2 9.5  Total Protein 6.5 - 8.1 g/dL 7.8 7.8 7.5  Total Bilirubin 0.3 - 1.2 mg/dL 0.5 0.5  0.2(L)  Alkaline Phos 38 - 126 U/L 70 69 73  AST 15 - 41 U/L 16 17 14(L)  ALT 0 - 44 U/L 10 13 10     DIAGNOSTIC IMAGING:  I have independently reviewed the scans and discussed with the patient. No results found.   ASSESSMENT:  1. Cancer of upper lobe of right lung [facility]) Metastatic adenocarcinoma of the lung to the bones and right adrenal: -Foundation 1 testing shows 14 genomic alterations, no approved therapies. -Opdivo 480 mg monthly started on February 25, 2015. -CT CAP on February 21, 2020 shows no evidence of progression or metastatic disease. T2 sclerotic lesion is stable.     PLAN:  1.  Metastatic lung adenocarcinoma: -She has been tolerating immunotherapy without any major side effects.  I reviewed LFTs which are within normal limits. -White count and platelets are normal.  She will proceed with Opdivo today. -I plan to see her back in 2 months for follow-up.  I plan to repeat CT scan of the chest, abdomen and pelvis prior to next visit.  2.  High risk drug monitoring: -Her TSH is 1.68.  We will continue to monitor.  3.  Health maintenance: -She will have a mammogram done next week.  4.  CKD: -Her creatinine is between 1.2-1.3.  Today creatinine is 1.26.  5.  Bone meta stasis: -Not currently on bisphosphonates due to ONJ in 2019.  6.  Tobacco use: -Continues to smoke 1/4 pack of cigarettes per day.  Smoking cessation recommended.    Orders placed this encounter:  No orders of the defined types were placed in this encounter.    Derek Jack, MD [facility] [phone]   I, Jacqualyn Posey, am acting as a scribe for Dr. Sanda Linger.  I, Derek Jack MD, have reviewed the above documentation for accuracy and completeness, and I agree with the above.

## 2020-06-18 NOTE — Progress Notes (Signed)
Seen by Dr Raliegh Ip today.  Okay to proceed with treatment.   Tolerated treatment today without incidence.  Vital signs WNL upon discharge.  Discharged ambulatory.

## 2020-06-18 NOTE — Progress Notes (Signed)
Patient has been assessed, vital signs and labs have been reviewed by Dr. Katragadda. ANC, Creatinine, LFTs, and Platelets are within treatment parameters per Dr. Katragadda. The patient is good to proceed with treatment at this time.  

## 2020-06-18 NOTE — Patient Instructions (Signed)
[facility] Discharge Instructions for Patients Receiving Chemotherapy  Today you received the following chemotherapy agents   To help prevent nausea and vomiting after your treatment, we encourage you to take your nausea medication   If you develop nausea and vomiting that is not controlled by your nausea medication, call the clinic.   BELOW ARE SYMPTOMS THAT SHOULD BE REPORTED IMMEDIATELY:  *FEVER GREATER THAN 100.5 F  *CHILLS WITH OR WITHOUT FEVER  NAUSEA AND VOMITING THAT IS NOT CONTROLLED WITH YOUR NAUSEA MEDICATION  *UNUSUAL SHORTNESS OF BREATH  *UNUSUAL BRUISING OR BLEEDING  TENDERNESS IN MOUTH AND THROAT WITH OR WITHOUT PRESENCE OF ULCERS  *URINARY PROBLEMS  *BOWEL PROBLEMS  UNUSUAL RASH Items with * indicate a potential emergency and should be followed up as soon as possible.  Feel free to call the clinic should you have any questions or concerns. The clinic phone number is [phone].  Please show the CHEMO ALERT CARD at check-in to the Emergency Department and triage nurse.   

## 2020-06-18 NOTE — Patient Instructions (Signed)
[place] at [facility] Discharge Instructions  You were seen today by Dr. Delton Coombes. He went over your recent results. Please increase your water intake. We will schedule you for a CT of the chest, abdomen, and pelvis. Dr. Delton Coombes will see you back in 2 months for labs, treatment, and follow up.   Thank you for choosing [place] at [facility] to provide your oncology and hematology care.  To afford each patient quality time with our provider, please arrive at least 15 minutes before your scheduled appointment time.   If you have a lab appointment with the [place] please come in thru the Main Entrance and check in at the main information desk  You need to re-schedule your appointment should you arrive 10 or more minutes late.  We strive to give you quality time with our providers, and arriving late affects you and other patients whose appointments are after yours.  Also, if you no show three or more times for appointments you may be dismissed from the clinic at the providers discretion.     Again, thank you for choosing [facility].  Our hope is that these requests will decrease the amount of time that you wait before being seen by our physicians.       _____________________________________________________________  Should you have questions after your visit to [facility], please contact our office at [phone] between the hours of 8:00 a.m. and 4:30 p.m.  Voicemails left after 4:00 p.m. will not be returned until the following business day.  For prescription refill requests, have your pharmacy contact our office and allow 72 hours.    Cancer Center Support Programs:   > Cancer Support Group  2nd Tuesday of the month 1pm-2pm, Journey Room

## 2020-06-25 ENCOUNTER — Other Ambulatory Visit: Payer: Self-pay

## 2020-06-25 ENCOUNTER — Ambulatory Visit (HOSPITAL_COMMUNITY)
Admission: RE | Admit: 2020-06-25 | Discharge: 2020-06-25 | Disposition: A | Discharge status: Home / Self Care | Payer: Medicare HMO | Source: Ambulatory Visit | Attending: Internal Medicine | Admitting: Internal Medicine

## 2020-06-25 DIAGNOSIS — Z1231 Encounter for screening mammogram for malignant neoplasm of breast: Secondary | ICD-10-CM | POA: Diagnosis not present

## 2020-06-29 DIAGNOSIS — C3490 Malignant neoplasm of unspecified part of unspecified bronchus or lung: Secondary | ICD-10-CM | POA: Diagnosis not present

## 2020-06-29 DIAGNOSIS — R634 Abnormal weight loss: Secondary | ICD-10-CM | POA: Diagnosis not present

## 2020-06-29 DIAGNOSIS — D538 Other specified nutritional anemias: Secondary | ICD-10-CM | POA: Diagnosis not present

## 2020-06-29 DIAGNOSIS — M87180 Osteonecrosis due to drugs, jaw: Secondary | ICD-10-CM | POA: Diagnosis not present

## 2020-06-29 DIAGNOSIS — C799 Secondary malignant neoplasm of unspecified site: Secondary | ICD-10-CM | POA: Diagnosis not present

## 2020-06-29 DIAGNOSIS — E440 Moderate protein-calorie malnutrition: Secondary | ICD-10-CM | POA: Diagnosis not present

## 2020-06-29 DIAGNOSIS — N1831 Chronic kidney disease, stage 3a: Secondary | ICD-10-CM | POA: Diagnosis not present

## 2020-07-16 ENCOUNTER — Inpatient Hospital Stay (HOSPITAL_COMMUNITY): Payer: Medicare HMO | Attending: Hematology

## 2020-07-16 ENCOUNTER — Encounter (HOSPITAL_COMMUNITY): Payer: Self-pay

## 2020-07-16 ENCOUNTER — Other Ambulatory Visit: Payer: Self-pay

## 2020-07-16 VITALS — BP 110/67 | HR 72 | Temp 97.2°F | Resp 16 | Wt 102.0 lb

## 2020-07-16 DIAGNOSIS — C3411 Malignant neoplasm of upper lobe, right bronchus or lung: Secondary | ICD-10-CM | POA: Insufficient documentation

## 2020-07-16 DIAGNOSIS — R69 Illness, unspecified: Secondary | ICD-10-CM | POA: Diagnosis not present

## 2020-07-16 DIAGNOSIS — C7971 Secondary malignant neoplasm of right adrenal gland: Secondary | ICD-10-CM | POA: Diagnosis not present

## 2020-07-16 DIAGNOSIS — Z923 Personal history of irradiation: Secondary | ICD-10-CM | POA: Diagnosis not present

## 2020-07-16 DIAGNOSIS — F17210 Nicotine dependence, cigarettes, uncomplicated: Secondary | ICD-10-CM | POA: Diagnosis not present

## 2020-07-16 DIAGNOSIS — C7951 Secondary malignant neoplasm of bone: Secondary | ICD-10-CM | POA: Diagnosis not present

## 2020-07-16 DIAGNOSIS — Z79899 Other long term (current) drug therapy: Secondary | ICD-10-CM | POA: Diagnosis not present

## 2020-07-16 DIAGNOSIS — Z9221 Personal history of antineoplastic chemotherapy: Secondary | ICD-10-CM | POA: Insufficient documentation

## 2020-07-16 DIAGNOSIS — E278 Other specified disorders of adrenal gland: Secondary | ICD-10-CM

## 2020-07-16 DIAGNOSIS — Z5112 Encounter for antineoplastic immunotherapy: Secondary | ICD-10-CM | POA: Diagnosis not present

## 2020-07-16 LAB — COMPREHENSIVE METABOLIC PANEL
ALT: 9 U/L (ref 0–44)
AST: 13 U/L — ABNORMAL LOW (ref 15–41)
Albumin: 4.3 g/dL (ref 3.5–5.0)
Alkaline Phosphatase: 61 U/L (ref 38–126)
Anion gap: 9 (ref 5–15)
BUN: 14 mg/dL (ref 8–23)
CO2: 23 mmol/L (ref 22–32)
Calcium: 9.4 mg/dL (ref 8.9–10.3)
Chloride: 106 mmol/L (ref 98–111)
Creatinine, Ser: 1.26 mg/dL — ABNORMAL HIGH (ref 0.44–1.00)
GFR calc Af Amer: 53 mL/min — ABNORMAL LOW (ref >60)
GFR calc non Af Amer: 45 mL/min — ABNORMAL LOW (ref >60)
Glucose, Bld: 105 mg/dL — ABNORMAL HIGH (ref 70–99)
Potassium: 3.7 mmol/L (ref 3.5–5.1)
Sodium: 138 mmol/L (ref 135–145)
Total Bilirubin: 0.3 mg/dL (ref 0.3–1.2)
Total Protein: 7.8 g/dL (ref 6.5–8.1)

## 2020-07-16 LAB — CBC WITH DIFFERENTIAL/PLATELET
Abs Immature Granulocytes: 0.03 10*3/uL (ref 0.00–0.07)
Basophils Absolute: 0 10*3/uL (ref 0.0–0.1)
Basophils Relative: 0 %
Eosinophils Absolute: 0.3 10*3/uL (ref 0.0–0.5)
Eosinophils Relative: 3 %
HCT: 43.3 % (ref 36.0–46.0)
Hemoglobin: 13.8 g/dL (ref 12.0–15.0)
Immature Granulocytes: 0 %
Lymphocytes Relative: 37 %
Lymphs Abs: 3.2 10*3/uL (ref 0.7–4.0)
MCH: 30 pg (ref 26.0–34.0)
MCHC: 31.9 g/dL (ref 30.0–36.0)
MCV: 94.1 fL (ref 80.0–100.0)
Monocytes Absolute: 0.7 10*3/uL (ref 0.1–1.0)
Monocytes Relative: 9 %
Neutro Abs: 4.4 10*3/uL (ref 1.7–7.7)
Neutrophils Relative %: 51 %
Platelets: 293 10*3/uL (ref 150–400)
RBC: 4.6 MIL/uL (ref 3.87–5.11)
RDW: 15.3 % (ref 11.5–15.5)
WBC: 8.6 10*3/uL (ref 4.0–10.5)
nRBC: 0 % (ref 0.0–0.2)

## 2020-07-16 MED ORDER — SODIUM CHLORIDE 0.9 % IV SOLN
480.0000 mg | Freq: Once | INTRAVENOUS | Status: AC
  Administered 2020-07-16: 480 mg via INTRAVENOUS
  Filled 2020-07-16: qty 48

## 2020-07-16 MED ORDER — SODIUM CHLORIDE 0.9 % IV SOLN: Freq: Once | INTRAVENOUS | Status: AC

## 2020-07-16 MED ORDER — SODIUM CHLORIDE 0.9% FLUSH
10.0000 mL | INTRAVENOUS | Status: DC | PRN
  Administered 2020-07-16: 10 mL

## 2020-07-16 MED ORDER — HEPARIN SOD (PORK) LOCK FLUSH 100 UNIT/ML IV SOLN
500.0000 [IU] | Freq: Once | INTRAVENOUS | Status: AC | PRN
  Administered 2020-07-16: 500 [IU]

## 2020-07-16 NOTE — Progress Notes (Signed)
Patient tolerated therapy with no complaints voiced.  Side effects with management reviewed with understanding verbalized.  Port site clean and dry with no bruising or swelling noted at site.  Good blood return noted before and after administration of therapy.  Band aid applied.  Patient left in satisfactory condition with VSS and no s/s of distress noted.

## 2020-08-07 DIAGNOSIS — B351 Tinea unguium: Secondary | ICD-10-CM | POA: Diagnosis not present

## 2020-08-07 DIAGNOSIS — M87180 Osteonecrosis due to drugs, jaw: Secondary | ICD-10-CM | POA: Diagnosis not present

## 2020-08-07 DIAGNOSIS — C3490 Malignant neoplasm of unspecified part of unspecified bronchus or lung: Secondary | ICD-10-CM | POA: Diagnosis not present

## 2020-08-07 DIAGNOSIS — R634 Abnormal weight loss: Secondary | ICD-10-CM | POA: Diagnosis not present

## 2020-08-07 DIAGNOSIS — E440 Moderate protein-calorie malnutrition: Secondary | ICD-10-CM | POA: Diagnosis not present

## 2020-08-07 DIAGNOSIS — C799 Secondary malignant neoplasm of unspecified site: Secondary | ICD-10-CM | POA: Diagnosis not present

## 2020-08-07 DIAGNOSIS — N1831 Chronic kidney disease, stage 3a: Secondary | ICD-10-CM | POA: Diagnosis not present

## 2020-08-07 DIAGNOSIS — E7849 Other hyperlipidemia: Secondary | ICD-10-CM | POA: Diagnosis not present

## 2020-08-07 DIAGNOSIS — D538 Other specified nutritional anemias: Secondary | ICD-10-CM | POA: Diagnosis not present

## 2020-08-07 DIAGNOSIS — E039 Hypothyroidism, unspecified: Secondary | ICD-10-CM | POA: Diagnosis not present

## 2020-08-07 DIAGNOSIS — G629 Polyneuropathy, unspecified: Secondary | ICD-10-CM | POA: Diagnosis not present

## 2020-08-10 ENCOUNTER — Other Ambulatory Visit: Payer: Self-pay

## 2020-08-10 ENCOUNTER — Other Ambulatory Visit (HOSPITAL_COMMUNITY): Payer: Self-pay | Admitting: Nurse Practitioner

## 2020-08-10 ENCOUNTER — Ambulatory Visit (HOSPITAL_COMMUNITY)
Admission: RE | Admit: 2020-08-10 | Discharge: 2020-08-10 | Disposition: A | Discharge status: Home / Self Care | Payer: Medicare HMO | Source: Ambulatory Visit | Attending: Hematology | Admitting: Hematology

## 2020-08-10 DIAGNOSIS — I700 Atherosclerosis of aorta: Secondary | ICD-10-CM | POA: Diagnosis not present

## 2020-08-10 DIAGNOSIS — C3411 Malignant neoplasm of upper lobe, right bronchus or lung: Secondary | ICD-10-CM | POA: Diagnosis not present

## 2020-08-10 DIAGNOSIS — K449 Diaphragmatic hernia without obstruction or gangrene: Secondary | ICD-10-CM | POA: Diagnosis not present

## 2020-08-10 DIAGNOSIS — E032 Hypothyroidism due to medicaments and other exogenous substances: Secondary | ICD-10-CM

## 2020-08-10 DIAGNOSIS — K5730 Diverticulosis of large intestine without perforation or abscess without bleeding: Secondary | ICD-10-CM | POA: Diagnosis not present

## 2020-08-10 MED ORDER — IOHEXOL 300 MG/ML  SOLN
75.0000 mL | Freq: Once | INTRAMUSCULAR | Status: AC | PRN
  Administered 2020-08-10: 75 mL via INTRAVENOUS

## 2020-08-13 ENCOUNTER — Other Ambulatory Visit: Payer: Self-pay

## 2020-08-13 ENCOUNTER — Inpatient Hospital Stay (HOSPITAL_BASED_OUTPATIENT_CLINIC_OR_DEPARTMENT_OTHER): Payer: Medicare HMO | Admitting: Hematology

## 2020-08-13 ENCOUNTER — Inpatient Hospital Stay (HOSPITAL_COMMUNITY): Payer: Medicare HMO

## 2020-08-13 ENCOUNTER — Inpatient Hospital Stay (HOSPITAL_COMMUNITY): Payer: Medicare HMO | Attending: Hematology

## 2020-08-13 VITALS — BP 122/62 | HR 98 | Temp 97.5°F | Resp 20 | Wt 103.4 lb

## 2020-08-13 VITALS — BP 125/70 | HR 78 | Temp 97.2°F | Resp 16

## 2020-08-13 DIAGNOSIS — C3411 Malignant neoplasm of upper lobe, right bronchus or lung: Secondary | ICD-10-CM | POA: Insufficient documentation

## 2020-08-13 DIAGNOSIS — F17210 Nicotine dependence, cigarettes, uncomplicated: Secondary | ICD-10-CM | POA: Insufficient documentation

## 2020-08-13 DIAGNOSIS — C7951 Secondary malignant neoplasm of bone: Secondary | ICD-10-CM | POA: Diagnosis not present

## 2020-08-13 DIAGNOSIS — E278 Other specified disorders of adrenal gland: Secondary | ICD-10-CM

## 2020-08-13 DIAGNOSIS — Z79899 Other long term (current) drug therapy: Secondary | ICD-10-CM | POA: Insufficient documentation

## 2020-08-13 DIAGNOSIS — C7971 Secondary malignant neoplasm of right adrenal gland: Secondary | ICD-10-CM | POA: Diagnosis not present

## 2020-08-13 DIAGNOSIS — Z5112 Encounter for antineoplastic immunotherapy: Secondary | ICD-10-CM | POA: Insufficient documentation

## 2020-08-13 DIAGNOSIS — Z923 Personal history of irradiation: Secondary | ICD-10-CM | POA: Diagnosis not present

## 2020-08-13 DIAGNOSIS — R69 Illness, unspecified: Secondary | ICD-10-CM | POA: Diagnosis not present

## 2020-08-13 DIAGNOSIS — Z9221 Personal history of antineoplastic chemotherapy: Secondary | ICD-10-CM | POA: Insufficient documentation

## 2020-08-13 LAB — CBC WITH DIFFERENTIAL/PLATELET
Abs Immature Granulocytes: 0.03 10*3/uL (ref 0.00–0.07)
Basophils Absolute: 0 10*3/uL (ref 0.0–0.1)
Basophils Relative: 1 %
Eosinophils Absolute: 0.2 10*3/uL (ref 0.0–0.5)
Eosinophils Relative: 2 %
HCT: 39.7 % (ref 36.0–46.0)
Hemoglobin: 12.7 g/dL (ref 12.0–15.0)
Immature Granulocytes: 0 %
Lymphocytes Relative: 30 %
Lymphs Abs: 2.5 10*3/uL (ref 0.7–4.0)
MCH: 30.2 pg (ref 26.0–34.0)
MCHC: 32 g/dL (ref 30.0–36.0)
MCV: 94.5 fL (ref 80.0–100.0)
Monocytes Absolute: 0.6 10*3/uL (ref 0.1–1.0)
Monocytes Relative: 7 %
Neutro Abs: 5.1 10*3/uL (ref 1.7–7.7)
Neutrophils Relative %: 60 %
Platelets: 364 10*3/uL (ref 150–400)
RBC: 4.2 MIL/uL (ref 3.87–5.11)
RDW: 15.2 % (ref 11.5–15.5)
WBC: 8.5 10*3/uL (ref 4.0–10.5)
nRBC: 0 % (ref 0.0–0.2)

## 2020-08-13 LAB — COMPREHENSIVE METABOLIC PANEL
ALT: 14 U/L (ref 0–44)
AST: 15 U/L (ref 15–41)
Albumin: 3.7 g/dL (ref 3.5–5.0)
Alkaline Phosphatase: 65 U/L (ref 38–126)
Anion gap: 7 (ref 5–15)
BUN: 11 mg/dL (ref 8–23)
CO2: 25 mmol/L (ref 22–32)
Calcium: 9.2 mg/dL (ref 8.9–10.3)
Chloride: 105 mmol/L (ref 98–111)
Creatinine, Ser: 1.16 mg/dL — ABNORMAL HIGH (ref 0.44–1.00)
GFR calc Af Amer: 58 mL/min — ABNORMAL LOW (ref >60)
GFR calc non Af Amer: 50 mL/min — ABNORMAL LOW (ref >60)
Glucose, Bld: 114 mg/dL — ABNORMAL HIGH (ref 70–99)
Potassium: 3.4 mmol/L — ABNORMAL LOW (ref 3.5–5.1)
Sodium: 137 mmol/L (ref 135–145)
Total Bilirubin: 0.4 mg/dL (ref 0.3–1.2)
Total Protein: 7.5 g/dL (ref 6.5–8.1)

## 2020-08-13 MED ORDER — SODIUM CHLORIDE 0.9 % IV SOLN
480.0000 mg | Freq: Once | INTRAVENOUS | Status: AC
  Administered 2020-08-13: 480 mg via INTRAVENOUS
  Filled 2020-08-13: qty 48

## 2020-08-13 MED ORDER — SODIUM CHLORIDE 0.9 % IV SOLN: Freq: Once | INTRAVENOUS | Status: AC

## 2020-08-13 MED ORDER — SODIUM CHLORIDE 0.9% FLUSH: 10.0000 mL | INTRAVENOUS | Status: DC | PRN

## 2020-08-13 MED ORDER — HEPARIN SOD (PORK) LOCK FLUSH 100 UNIT/ML IV SOLN
500.0000 [IU] | Freq: Once | INTRAVENOUS | Status: AC | PRN
  Administered 2020-08-13: 500 [IU]

## 2020-08-13 NOTE — Patient Instructions (Signed)
[place] at [facility] Discharge Instructions  You were seen today by Dr. Delton Coombes. He went over your recent results and scans. You received your treatment today. You will be scheduled for a CT scan of your chest before your next visit. Dr. Delton Coombes will see you back in 4 weeks for labs and follow up.   Thank you for choosing [place] at [facility] to provide your oncology and hematology care.  To afford each patient quality time with our provider, please arrive at least 15 minutes before your scheduled appointment time.   If you have a lab appointment with the [place] please come in thru the Main Entrance and check in at the main information desk  You need to re-schedule your appointment should you arrive 10 or more minutes late.  We strive to give you quality time with our providers, and arriving late affects you and other patients whose appointments are after yours.  Also, if you no show three or more times for appointments you may be dismissed from the clinic at the providers discretion.     Again, thank you for choosing [facility].  Our hope is that these requests will decrease the amount of time that you wait before being seen by our physicians.       _____________________________________________________________  Should you have questions after your visit to [facility], please contact our office at [phone] between the hours of 8:00 a.m. and 4:30 p.m.  Voicemails left after 4:00 p.m. will not be returned until the following business day.  For prescription refill requests, have your pharmacy contact our office and allow 72 hours.    Cancer Center Support Programs:   > Cancer Support Group  2nd Tuesday of the month 1pm-2pm, Journey Room

## 2020-08-13 NOTE — Progress Notes (Signed)
Labs reviewed today with MD. Will proceed as planned per MD.  Treatment given per orders. Patient tolerated it well without problems. Vitals stable and discharged home from clinic ambulatory in stable condtion. Follow up as scheduled.

## 2020-08-13 NOTE — Progress Notes (Signed)
Patient was assessed by Dr. Delton Coombes and labs have been reviewed.  Potassium 3.4, no new orders received. Patient is okay to proceed with treatment today. Primary RN and pharmacy aware.

## 2020-08-13 NOTE — Progress Notes (Signed)
[facility] [address], [place] [address]   CLINIC:  Medical Oncology/Hematology  PCP:  Melissa Squibb, MD [address] Liana Sullivan [place] [address] [phone]   REASON FOR VISIT:  Follow-up for metastatic right lung cancer  PRIOR THERAPY:  1. Bevacizumab, cisplatin and pemetrexed x 4 cycles from 09/02/2014 to 11/04/2014. 2. Bevacizumab and pemetrexed x 3 cycles from 12/24/2014 to 02/04/2015.  NGS Results: Not done  CURRENT THERAPY: Opdivo every 4 weeks  BRIEF ONCOLOGIC HISTORY:  Oncology History  Cancer of upper lobe of right lung ([place])  07/28/2014 Imaging   CT chest: Large R apical mass consistent with malignancy. This is destroying the R 2nd rib with extension into adjacent soft tissue. R hilar adenopathy with R 5cm adrenal metastatic lesion.   08/01/2014 Initial Biopsy   Lung, needle/core biopsy(ies), right upper lobe - POORLY DIFFERENTIATED ADENOCARCINOMA, SEE COMMENT.   08/08/2014 PET scan   Large hypermetabolic R apical mass with evidence of direct chest wall and mediastinal invasion, right retrocrural lymphadenopathy, extensive retroperitoneal lymphadenopathy, and metastatic lesions to the adrenal glands    09/02/2014 - 11/04/2014 Chemotherapy   Cisplatin/Pemetrexed/Avastin every 21 days x 4 cycles   10/07/2014 - 10/27/2014 Radiation Therapy   Right lung apex for control of brachioplexopathy.   12/24/2014 - 02/25/2015 Chemotherapy   Alimta/Avastin every 21 days.   02/20/2015 Imaging   Increase in size of right adrenal metastasis and subjacent confluent retrocaval lymphadenopathy   02/25/2015 -  Chemotherapy   Nivolumab, zometa   05/04/2015 Imaging   CT CAP- Stable to slight decrease in the posterior right apical lesion. Stable appearance of posterior right upper rib an upper thoracic bony lesions. Slight improvement in right upper lobe tree-in-bud opacity. No new or progressive findings in...   07/28/2015 Imaging   CT CAP- Reduced size of the  right apical pleural parenchymal lesion and reduced size of the right adrenal metastatic lesion. Resolution of prior retrocrural adenopathy.  Right eccentric T1 and T2 sclerosis with sclerosis and tapering of the right second..   11/17/2015 Imaging   CT CAP- Stable soft tissue thickening in the apex of the right hemi thorax. Stable right adrenal metastasis. Nodularity along the trachea and mainstem bronchi, relatively new from 07/28/2015, favoring adherent debris.   11/18/2015 Treatment Plan Change   Zometa HELD for upcoming tooth extraction   11/24/2015 Treatment Plan Change   Zometa on hold at this time in preparation for tooth extraction in March 2017.  Zometa las given on 11/18/2015.   02/03/2016 Imaging   CT CAP- Heterogeneous right apical masslike consolidation and right adrenal metastasis are unchanged   04/06/2016 Treatment Plan Change   Zometa restarted 6 weeks out from tooth extraction (04/06/2016)   05/12/2016 Imaging   CT CAP- NED in the chest, abdomen or pelvis.  Some areas of nodularity associated with the mainstem bronchi in the left upper lobe bronchus, favored to represent adherent inspissated secretions   08/17/2016 Imaging   CT CAP- 1. Stable CTs of the chest and abdomen. No evidence of progressive metastatic disease. 2. Probable treated tumor at the right apex, right adrenal gland and T2 vertebral body, stable. 3. Fluctuating nodularity along the walls of the trachea and mainstem bronchi, likely secretions.   12/12/2016 Imaging   Further decrease in size of treated tumor within the right apex. 2. Stable treated tumor involving the right second rib and T2 vertebra. 3. Stable right adrenal gland treated tumor. 4. Emphysema 5. Aortic atherosclerosis  01/04/2017 Imaging   MRI brain- Normal brain MRI.  No intracranial metastatic disease.   03/15/2017 Imaging   CT CAP- 1. No new or progressive metastatic disease in the chest or abdomen. 2. Stable treated tumor in the  apical right upper lobe. Stable treated right posterior second rib and right T2 vertebral lesions. Stable treated right adrenal metastasis. 3. Aortic atherosclerosis. 4. Moderate emphysema with mild diffuse bronchial wall thickening, suggesting COPD.   06/12/2017 Imaging   CT CAP 1. Similar appearance of treated primary within the right apex. 2. Similar areas of sclerosis within the right second rib, T2, and less so T1 vertebral bodies. These are most consistent with treated metastasis. 3. Similar right adrenal treated metastasis. 4. No evidence of new or progressive disease. 5. Similar right and progressive left areas of bronchial wall thickening and probable mucoid impaction. Correlate with interval infectious symptoms. 6.  Emphysema (ICD10-J43.9). 7. Coronary artery atherosclerosis. Aortic Atherosclerosis (ICD10-I70.0).    10/16/2017 Imaging   CT CAP 1. Stable appearance of the prior [place] tumor and related bony findings in the right second rib and right T1 and T2 vertebra compatible with successfully treated tumor. No significant enlargement or new lesions are identified. Similarly the treated right adrenal metastatic lesion is stable in appearance. 2. Upper normal size right hilar lymph node may warrant surveillance. Currently 9 mm in short axis. 3. Other imaging findings of potential clinical significance: Aortic Atherosclerosis (ICD10-I70.0) and Emphysema (ICD10-J43.9). Scattered proximal sigmoid colon diverticula. Mucus plugging medially in the left upper lobe and posteriorly in the right upper lobe.      CANCER STAGING: Cancer Staging No matching staging information was found for the patient.  INTERVAL HISTORY:  [name], a 64 y.o. female, returns for routine follow-up and consideration for next cycle of chemotherapy. [name] was last seen on 06/18/2020.  Due for cycle #36 of nivolumab today.   Overall, she tells me she has been feeling pretty  well. She is tolerating the treatments well and denies having diarrhea, skin rashes, easy fatigability, leg swelling, and her cough is stable. Her allergies have been acting up which is her usual with the seasonal changes. Her appetite is good and she is gaining weight.  Overall, she feels ready for next cycle of chemo today.    REVIEW OF SYSTEMS:  Review of Systems  Constitutional: Negative for appetite change and fatigue.  HENT:   Positive for trouble swallowing (chewing issues d/t teeth).   Respiratory: Positive for cough (stable).   Cardiovascular: Negative for leg swelling.  Gastrointestinal: Negative for diarrhea.  Skin: Negative for rash.  All other systems reviewed and are negative.   PAST MEDICAL/SURGICAL HISTORY:  Past Medical History:  Diagnosis Date   Adrenal mass, right ([facility]) 07/28/2014   Anemia    Bone metastases ([facility]) 04/05/2016   Cancer ([facility])    lung  right   Diabetes mellitus without complication ([facility])    GERD (gastroesophageal reflux disease)    Hyperlipidemia    Hypertension    Hypothyroidism due to medication 01/30/2017   Lung mass 07/28/2014   Reflux    Past Surgical History:  Procedure Laterality Date   AMPUTATION Right 02/22/2017   Procedure: PARTIAL AMPUTATION RIGHT GREAT TOE;  Surgeon: Caprice Beaver, DPM;  Location: [facility];  Service: Podiatry;  Laterality: Right;   APPENDECTOMY     ESOPHAGOGASTRODUODENOSCOPY N/A 12/09/2014   GUR:KYHCWC esophageal stricture/mild-to-noderate erosive gastritis. negative H.pylori   FLEXIBLE SIGMOIDOSCOPY  2011   Dr. [name]: hyperplastic polyp  LUNG BIOPSY Right 07/2014   CT guided   MALONEY DILATION N/A 12/09/2014   Procedure: MALONEY DILATION;  Surgeon: [name], MD;  Location: [facility];  Service: Endoscopy;  Laterality: N/A;   PORTACATH PLACEMENT Left 09/01/2014   SAVORY DILATION N/A 12/09/2014   Procedure: SAVORY DILATION;  Surgeon: [name], MD;  Location: [facility];  Service:  Endoscopy;  Laterality: N/A;    SOCIAL HISTORY:  Social History   Socioeconomic History   Marital status: Married    Spouse name: Not on file   Number of children: Not on file   Years of education: Not on file   Highest education level: Not on file  Occupational History   Not on file  Tobacco Use   Smoking status: Light Tobacco Smoker    Packs/day: 0.30    Years: 20.00    Pack years: 6.00    Types: Cigarettes   Smokeless tobacco: Never Used  Vaping Use   Vaping Use: Never used  Substance and Sexual Activity   Alcohol use: No   Drug use: No   Sexual activity: Not on file  Other Topics Concern   Not on file  Social History Narrative   Not on file   Social Determinants of Health   Financial Resource Strain:    Difficulty of Paying Living Expenses: Not on file  Food Insecurity:    Worried About [place] in the Last Year: Not on file   [employer] of Food in the Last Year: Not on file  Transportation Needs:    Lack of Transportation (Medical): Not on file   Lack of Transportation (Non-Medical): Not on file  Physical Activity:    Days of Exercise per Week: Not on file   Minutes of Exercise per Session: Not on file  Stress:    Feeling of Stress : Not on file  Social Connections:    Frequency of Communication with Friends and Family: Not on file   Frequency of Social Gatherings with Friends and Family: Not on file   Attends Religious Services: Not on file   Active Member of [place] or Organizations: Not on file   Attends Archivist Meetings: Not on file   Marital Status: Not on file  Intimate Partner Violence:    Fear of Current or Ex-Partner: Not on file   Emotionally Abused: Not on file   Physically Abused: Not on file   Sexually Abused: Not on file    FAMILY HISTORY:  Family History  Problem Relation Age of Onset   Cancer Sister    Colon cancer Neg Hx     CURRENT MEDICATIONS:  Current Outpatient  Medications  Medication Sig Dispense Refill   albuterol (VENTOLIN HFA) 108 (90 Base) MCG/ACT inhaler INHALE 2 PUFFS BY MOUTH EVERY 6 HOURS AS NEEDED FOR WHEEZING FOR SHORTNESS OF BREATH     chlorhexidine (PERIDEX) 0.12 % solution SMARTSIG:15 Milliliter(s) By Mouth Twice a Week     dexlansoprazole (DEXILANT) 60 MG capsule 1 PO EVERY MORNING WITH BREAKFAST. 30 capsule 11   dronabinol (MARINOL) 2.5 MG capsule Take 1 capsule (2.5 mg total) by mouth 2 (two) times daily before lunch and supper. 60 capsule 2   ENSURE (ENSURE) Take 1 Can by mouth 4 (four) times daily.     Fluticasone-Salmeterol (ADVAIR DISKUS) 500-50 MCG/DOSE AEPB Inhale 1 puff into the lungs 2 (two) times daily. 60 each 6   ibuprofen (ADVIL,MOTRIN) 200 MG tablet Take 400 mg  by mouth every 8 (eight) hours as needed (for pain.).      levocetirizine (XYZAL) 5 MG tablet TAKE 1 TABLET BY MOUTH AT BEDTIME FOR ALLERGIES COUGH     levothyroxine (SYNTHROID) 25 MCG tablet TAKE 1/2 (ONE-HALF) TABLET BY MOUTH ONCE DAILY BEFORE BREAKFAST 45 tablet 0   magic mouthwash SOLN Take 5 mLs by mouth 4 (four) times daily as needed for mouth pain. 480 mL 2   Nivolumab (OPDIVO IV) Inject into the vein every 28 (twenty-eight) days.     omeprazole (PRILOSEC) 40 MG capsule TAKE 1 CAPSULE BY MOUTH ONCE DAILY. TAKE 30 MINUTES PRIOR TO YOUR FIRST MEAL. 90 capsule 1   Oxycodone HCl 10 MG TABS Take 1-2 tablets (10-20 mg total) by mouth every 12 (twelve) hours as needed (pain.). 60 tablet 0   rosuvastatin (CRESTOR) 20 MG tablet Take 20 mg by mouth daily.     lidocaine-prilocaine (EMLA) cream APPLY A QUARTER SIZE AMOUNT TO PORT SITE 1 HOUR PRIOR TO CHEMO.  DO NOT RUB IN.  COVER WITH PLASTIC WRAP (Patient not taking: Reported on 08/13/2020) 30 g 5   ondansetron (ZOFRAN) 8 MG tablet Take 1 tablet (8 mg total) by mouth every 8 (eight) hours as needed for nausea or vomiting. (Patient not taking: Reported on 08/13/2020) 60 tablet 1   No current  facility-administered medications for this visit.   Facility-Administered Medications Ordered in Other Visits  Medication Dose Route Frequency Provider Last Rate Last Admin   sodium chloride flush (NS) 0.9 % injection 10 mL  10 mL Intracatheter PRN Derek Jack, MD   10 mL at 01/03/19 0930    ALLERGIES:  Allergies  Allergen Reactions   Xgeva [Denosumab]     osteonecrosis   Omeprazole     Made legs hurt    PHYSICAL EXAM:  Performance status (ECOG): 0 - Asymptomatic  Vitals:   08/13/20 0945  BP: 122/62  Pulse: 98  Resp: 20  Temp: (!) 97.5 F (36.4 C)  SpO2: 100%   Wt Readings from Last 3 Encounters:  08/13/20 103 lb 6.4 oz (46.9 kg)  07/16/20 102 lb (46.3 kg)  06/18/20 102 lb 11.2 oz (46.6 kg)   Physical Exam Vitals reviewed.  Constitutional:      Appearance: Normal appearance.  Cardiovascular:     Rate and Rhythm: Normal rate and regular rhythm.     Pulses: Normal pulses.     Heart sounds: Normal heart sounds.  Pulmonary:     Effort: Pulmonary effort is normal.     Breath sounds: Normal breath sounds.  Abdominal:     Palpations: Abdomen is soft. There is no mass.     Tenderness: There is abdominal tenderness in the epigastric area.     Hernia: No hernia is present.  Musculoskeletal:     Right lower leg: No edema.     Left lower leg: No edema.  Neurological:     General: No focal deficit present.     Mental Status: She is alert and oriented to person, place, and time.  Psychiatric:        Mood and Affect: Mood normal.        Behavior: Behavior normal.     LABORATORY DATA:  I have reviewed the labs as listed.  CBC Latest Ref Rng & Units 08/13/2020 07/16/2020 06/18/2020  WBC 4.0 - 10.5 K/uL 8.5 8.6 9.2  Hemoglobin 12.0 - 15.0 g/dL 12.7 13.8 13.6  Hematocrit 36 - 46 % 39.7 43.3 42.2  Platelets  150 - 400 K/uL 364 293 283   CMP Latest Ref Rng & Units 08/13/2020 07/16/2020 06/18/2020  Glucose 70 - 99 mg/dL 114(H) 105(H) 64(L)  BUN 8 - 23 mg/dL 11 14  12   Creatinine 0.44 - 1.00 mg/dL 1.16(H) 1.26(H) 1.26(H)  Sodium 135 - 145 mmol/L 137 138 138  Potassium 3.5 - 5.1 mmol/L 3.4(L) 3.7 3.4(L)  Chloride 98 - 111 mmol/L 105 106 105  CO2 22 - 32 mmol/L 25 23 25   Calcium 8.9 - 10.3 mg/dL 9.2 9.4 9.4  Total Protein 6.5 - 8.1 g/dL 7.5 7.8 7.8  Total Bilirubin 0.3 - 1.2 mg/dL 0.4 0.3 0.5  Alkaline Phos 38 - 126 U/L 65 61 70  AST 15 - 41 U/L 15 13(L) 16  ALT 0 - 44 U/L 14 9 10     DIAGNOSTIC IMAGING:  I have independently reviewed the scans and discussed with the patient. CT Abdomen Pelvis W Contrast  Result Date: 08/10/2020 CLINICAL DATA:  History of stage IV right lung cancer diagnosed in 2015. Restaging. EXAM: CT ABDOMEN AND PELVIS WITH CONTRAST TECHNIQUE: Multidetector CT imaging of the abdomen and pelvis was performed using the standard protocol following bolus administration of intravenous contrast. CONTRAST:  76mL OMNIPAQUE IOHEXOL 300 MG/ML  SOLN COMPARISON:  02/24/2020 CT chest, abdomen and pelvis. FINDINGS: Lower chest: Scattered mild cylindrical bronchiectasis at both lung bases with associated scattered mucoid impaction. Bandlike consolidation is increased in the inferior lingula. New mild patchy consolidation and ground-glass opacity in the medial left lower lobe. Hepatobiliary: Normal liver size. No liver mass. Normal gallbladder with no radiopaque cholelithiasis. No biliary ductal dilatation. Pancreas: Normal, with no mass or duct dilation. Spleen: Normal size. No mass. Adrenals/Urinary Tract: Coarse calcifications throughout the irregular mildly enlarged right adrenal gland, unchanged. No new adrenal nodules. Normal kidneys with no hydronephrosis and no renal mass. Normal bladder. Stomach/Bowel: Small hiatal hernia. Otherwise normal nondistended stomach. Normal caliber small bowel with no small bowel wall thickening. Appendectomy. Oral contrast transits to the rectum. Mild left colonic diverticulosis with no large bowel wall thickening or  significant pericolonic fat stranding. Vascular/Lymphatic: Nonaneurysmal abdominal aorta. Patent portal, splenic, hepatic and renal veins. No pathologically enlarged lymph nodes in the abdomen or pelvis. Reproductive: Grossly normal uterus.  No adnexal mass. Other: No pneumoperitoneum, ascites or focal fluid collection. Stable tiny fat containing umbilical hernia. Musculoskeletal: No aggressive appearing focal osseous lesions. Mild lumbar spondylosis. IMPRESSION: 1. No evidence of recurrent metastatic disease in the abdomen or pelvis. Stable post treatment appearance of the right adrenal metastasis. 2. New mild patchy consolidation and ground-glass opacity in the medial left lower lobe, favor a nonspecific infectious or inflammatory etiology, with the differential including aspiration. 3. Scattered mild cylindrical bronchiectasis at both lung bases with associated scattered mucoid impaction. Bandlike scarring versus atelectasis in the inferior lingula is increased. 4. Chronic findings include: Small hiatal hernia. Mild left colonic diverticulosis. Aortic Atherosclerosis (ICD10-I70.0). Electronically Signed   By: Ilona Sorrel M.D.   On: 08/10/2020 10:21     ASSESSMENT:  1. Cancer of upper lobe of right lung [facility]) Metastatic adenocarcinoma of the lung to the bones and right adrenal: -Foundation 1 testing shows 14 genomic alterations, no approved therapies. -Opdivo 480 mg monthly started on February 25, 2015. -CT CAP on February 21, 2020 shows no evidence of progression or metastatic disease. T2 sclerotic lesion is stable. -CTAP on 08/10/2020 with no evidence of recurrent metastatic disease in the abdomen or pelvis.  Irregular mildly enlarged right adrenal gland unchanged.  PLAN:  1.  Metastatic lung adenocarcinoma: -No immunotherapy related side effects noted. -LFTs are normal.  CBC is also grossly normal. -I have reviewed the CTAP findings from 08/10/2020 which did not show any evidence of recurrent  metastatic disease.  Mild patchy consolidation in the medial left lower lobe. -She will proceed with her treatment today.  I plan to order CT chest prior to next visit in 4 weeks.  2.  High risk drug monitoring: -Last TSH is 1.68.  Continue to monitor.  3.  Health maintenance: -Mammogram on 06/23/2020 was BI-RADS Category 1.  4.  CKD: -Baseline creatinine between 1.2-1.3.  Today it is 1.16.  5.  Bone metastasis: -Not currently on bisphosphonates due to ONJ in 2019.    Orders placed this encounter:  No orders of the defined types were placed in this encounter.    Derek Jack, MD [place] [phone]   I, Milinda Antis, am acting as a scribe for Dr. Sanda Linger.  I, Derek Jack MD, have reviewed the above documentation for accuracy and completeness, and I agree with the above.

## 2020-08-13 NOTE — Patient Instructions (Signed)
[facility] Discharge Instructions for Patients Receiving Chemotherapy  Today you received the following chemotherapy agents   To help prevent nausea and vomiting after your treatment, we encourage you to take your nausea medication   If you develop nausea and vomiting that is not controlled by your nausea medication, call the clinic.   BELOW ARE SYMPTOMS THAT SHOULD BE REPORTED IMMEDIATELY:  *FEVER GREATER THAN 100.5 F  *CHILLS WITH OR WITHOUT FEVER  NAUSEA AND VOMITING THAT IS NOT CONTROLLED WITH YOUR NAUSEA MEDICATION  *UNUSUAL SHORTNESS OF BREATH  *UNUSUAL BRUISING OR BLEEDING  TENDERNESS IN MOUTH AND THROAT WITH OR WITHOUT PRESENCE OF ULCERS  *URINARY PROBLEMS  *BOWEL PROBLEMS  UNUSUAL RASH Items with * indicate a potential emergency and should be followed up as soon as possible.  Feel free to call the clinic should you have any questions or concerns. The clinic phone number is [phone].  Please show the CHEMO ALERT CARD at check-in to the Emergency Department and triage nurse.   

## 2020-08-25 ENCOUNTER — Other Ambulatory Visit: Payer: Self-pay

## 2020-08-25 ENCOUNTER — Encounter: Payer: Self-pay | Admitting: *Deleted

## 2020-08-25 ENCOUNTER — Ambulatory Visit (INDEPENDENT_AMBULATORY_CARE_PROVIDER_SITE_OTHER): Payer: Medicare HMO | Admitting: Gastroenterology

## 2020-08-25 ENCOUNTER — Encounter: Payer: Self-pay | Admitting: Gastroenterology

## 2020-08-25 VITALS — BP 114/71 | HR 91 | Temp 97.3°F | Ht 59.0 in | Wt 103.0 lb

## 2020-08-25 DIAGNOSIS — Z1211 Encounter for screening for malignant neoplasm of colon: Secondary | ICD-10-CM

## 2020-08-25 DIAGNOSIS — K219 Gastro-esophageal reflux disease without esophagitis: Secondary | ICD-10-CM | POA: Diagnosis not present

## 2020-08-25 NOTE — Progress Notes (Signed)
Referring Provider: [name], MD Primary Care Physician:  [name], MD Primary GI: Dr. Abbey Chatters  Chief Complaint  Patient presents with   Follow-up    doing well     HPI:   Melissa Sullivan is a 64 y.o. female presenting today with a history of stage IV lung adenocarcinoma to the bones and right adrenal, last EGD in Jan 2016 with distal esophageal stricture s/p dilation, mild to moderate erosive gastritis. Negative KOH. Colonoscopy arranged in 2016 but risks outweighed the benefits due to history of advanced cancer. Flex sig in 2011 with hyperplastic polyps. Consideration for colonoscopy was to be made at this visit. She has recently seen Oncology with CT abd/pelvis without recurrent metastatic disease in abdomen or pelvis, irregularly mildly enlarged right adrenal gland unchanged. CT chest due 10/20. She continues to undergo chemo and tolerating well.   Changed to Dexilant at last visit and doing well with this. No dysphagia. No abdominal pain. No hematochezia. Takes iron and stool is dark on iron. Would like to go ahead and do a colonoscopy now. No constipation or diarrhea. No changes in bowel habits. Usually has a BM in morning right after breakfast.   Past Medical History:  Diagnosis Date   Adrenal mass, right ([place]) 07/28/2014   Anemia    Bone metastases ([place]) 04/05/2016   Cancer ([place])    lung  right   Diabetes mellitus without complication (HCC)    GERD (gastroesophageal reflux disease)    Hyperlipidemia    Hypertension    Hypothyroidism due to medication 01/30/2017   Lung mass 07/28/2014   Reflux     Past Surgical History:  Procedure Laterality Date   AMPUTATION Right 02/22/2017   Procedure: PARTIAL AMPUTATION RIGHT GREAT TOE;  Surgeon: Caprice Beaver, DPM;  Location: [facility];  Service: Podiatry;  Laterality: Right;   APPENDECTOMY     ESOPHAGOGASTRODUODENOSCOPY N/A 12/09/2014   GDJ:MEQAST esophageal stricture/mild-to-noderate erosive gastritis. negative  H.pylori   FLEXIBLE SIGMOIDOSCOPY  2011   Dr. [name]: hyperplastic polyp   LUNG BIOPSY Right 07/2014   CT guided   MALONEY DILATION N/A 12/09/2014   Procedure: MALONEY DILATION;  Surgeon: [name], MD;  Location: [facility];  Service: Endoscopy;  Laterality: N/A;   PORTACATH PLACEMENT Left 09/01/2014   SAVORY DILATION N/A 12/09/2014   Procedure: SAVORY DILATION;  Surgeon: [name], MD;  Location: [facility];  Service: Endoscopy;  Laterality: N/A;    Current Outpatient Medications  Medication Sig Dispense Refill   albuterol (VENTOLIN HFA) 108 (90 Base) MCG/ACT inhaler INHALE 2 PUFFS BY MOUTH EVERY 6 HOURS AS NEEDED FOR WHEEZING FOR SHORTNESS OF BREATH     dexlansoprazole (DEXILANT) 60 MG capsule 1 PO EVERY MORNING WITH BREAKFAST. 30 capsule 11   dronabinol (MARINOL) 2.5 MG capsule Take 1 capsule (2.5 mg total) by mouth 2 (two) times daily before lunch and supper. 60 capsule 2   ENSURE (ENSURE) Take 1 Can by mouth 4 (four) times daily.     Ferrous Sulfate (IRON) 325 (65 Fe) MG TABS Take by mouth daily.     Fluticasone-Salmeterol (ADVAIR DISKUS) 500-50 MCG/DOSE AEPB Inhale 1 puff into the lungs 2 (two) times daily. 60 each 6   ibuprofen (ADVIL,MOTRIN) 200 MG tablet Take 400 mg by mouth every 8 (eight) hours as needed (for pain.).      levocetirizine (XYZAL) 5 MG tablet TAKE 1 TABLET BY MOUTH AT BEDTIME FOR ALLERGIES COUGH  levothyroxine (SYNTHROID) 25 MCG tablet TAKE 1/2 (ONE-HALF) TABLET BY MOUTH ONCE DAILY BEFORE BREAKFAST 45 tablet 0   magic mouthwash SOLN Take 5 mLs by mouth 4 (four) times daily as needed for mouth pain. 480 mL 2   Nivolumab (OPDIVO IV) Inject into the vein every 28 (twenty-eight) days.     Oxycodone HCl 10 MG TABS Take 1-2 tablets (10-20 mg total) by mouth every 12 (twelve) hours as needed (pain.). 60 tablet 0   rosuvastatin (CRESTOR) 20 MG tablet Take 20 mg by mouth daily.     No current facility-administered medications for this  visit.   Facility-Administered Medications Ordered in Other Visits  Medication Dose Route Frequency Provider Last Rate Last Admin   sodium chloride flush (NS) 0.9 % injection 10 mL  10 mL Intracatheter PRN Derek Jack, MD   10 mL at 01/03/19 0930    Allergies as of 08/25/2020 - Review Complete 08/25/2020  Allergen Reaction Noted   Xgeva [denosumab]  08/15/2018   Omeprazole  02/20/2020    Family History  Problem Relation Age of Onset   Cancer Sister    Colon cancer Neg Hx     Social History   Socioeconomic History   Marital status: Married    Spouse name: Not on file   Number of children: Not on file   Years of education: Not on file   Highest education level: Not on file  Occupational History   Not on file  Tobacco Use   Smoking status: Light Tobacco Smoker    Packs/day: 0.30    Years: 20.00    Pack years: 6.00    Types: Cigarettes   Smokeless tobacco: Never Used  Vaping Use   Vaping Use: Never used  Substance and Sexual Activity   Alcohol use: No   Drug use: No   Sexual activity: Not on file  Other Topics Concern   Not on file  Social History Narrative   Not on file   Social Determinants of Health   Financial Resource Strain:    Difficulty of Paying Living Expenses: Not on file  Food Insecurity:    Worried About Charity fundraiser in the Last Year: Not on file   [employer] of Food in the Last Year: Not on file  Transportation Needs:    Lack of Transportation (Medical): Not on file   Lack of Transportation (Non-Medical): Not on file  Physical Activity:    Days of Exercise per Week: Not on file   Minutes of Exercise per Session: Not on file  Stress:    Feeling of Stress : Not on file  Social Connections:    Frequency of Communication with Friends and Family: Not on file   Frequency of Social Gatherings with Friends and Family: Not on file   Attends Religious Services: Not on file   Active Member of Clubs or  Organizations: Not on file   Attends Archivist Meetings: Not on file   Marital Status: Not on file    Review of Systems: Gen: Denies fever, chills, anorexia. Denies fatigue, weakness, weight loss.  CV: Denies chest pain, palpitations, syncope, peripheral edema, and claudication. Resp: +DOE GI: see HPI Derm: Denies rash, itching, dry skin Psych: Denies depression, anxiety, memory loss, confusion. No homicidal or suicidal ideation.  Heme: Denies bruising, bleeding, and enlarged lymph nodes.  Physical Exam: BP 114/71    Pulse 91    Temp (!) 97.3 F (36.3 C) (Temporal)    Ht 4'  11" (1.499 m)    Wt 103 lb (46.7 kg)    BMI 20.80 kg/m  General:   Alert and oriented. No distress noted. Pleasant and cooperative.  Head:  Normocephalic and atraumatic. Eyes:  Conjuctiva clear without scleral icterus. Mouth:  Mask in place Lungs: clear bilaterally Cardiac: S1 S2 present without murmurs Abdomen:  +BS, soft, non-tender and non-distended. No rebound or guarding. No HSM or masses noted. Msk:  Symmetrical without gross deformities. Normal posture. Extremities:  Without edema. Neurologic:  Alert and  oriented x4 Psych:  Alert and cooperative. Normal mood and affect.  ASSESSMENT: Melissa Sullivan is a 64 y.o. female presenting today with long-standing history of GERD, now well-controlled on Dexilant, history of hyperplastic polyp on flex sig in 2011. Followed by Oncology for stage IV lung adenocarcinoma to the bones and right adrenal She has recently seen Oncology with CT abd/pelvis without recurrent metastatic disease in abdomen or pelvis, irregularly mildly enlarged right adrenal gland unchanged. CT chest due 10/20. She continues to undergo chemo and tolerating well. Desires colonoscopy now.   PLAN:  Proceed with colonoscopy by Dr. Abbey Chatters  in near future: the risks, benefits, and alternatives have been discussed with the patient in detail. The patient states understanding and desires to  proceed.   Hold iron 7 days prior  Continue Dexilant daily  Return in 6-8 months   Annitta Needs, PhD, [facility]

## 2020-08-25 NOTE — Patient Instructions (Signed)
We are arranging a colonoscopy in the near future with Dr. Abbey Chatters. Please stop iron 7 days before procedure.   Continue Dexilant once daily.  We will see you in 6-8 months!  I enjoyed seeing you again today! As you know, I value our relationship and want to provide genuine, compassionate, and quality care. I welcome your feedback. If you receive a survey regarding your visit,  I greatly appreciate you taking time to fill this out. See you next time!  Annitta Needs, PhD, ANP-BC [facility]

## 2020-08-25 NOTE — H&P (View-Only) (Signed)
Referring Provider: [name], MD Primary Care Physician:  [name], MD Primary GI: Dr. Abbey Chatters  Chief Complaint  Patient presents with  . Follow-up    doing well     HPI:   Melissa Sullivan is a 64 y.o. female presenting today with a history of stage IV lung adenocarcinoma to the bones and right adrenal, last EGD in Jan 2016 with distal esophageal stricture s/p dilation, mild to moderate erosive gastritis. Negative KOH. Colonoscopy arranged in 2016 but risks outweighed the benefits due to history of advanced cancer. Flex sig in 2011 with hyperplastic polyps. Consideration for colonoscopy was to be made at this visit. She has recently seen Oncology with CT abd/pelvis without recurrent metastatic disease in abdomen or pelvis, irregularly mildly enlarged right adrenal gland unchanged. CT chest due 10/20. She continues to undergo chemo and tolerating well.   Changed to Dexilant at last visit and doing well with this. No dysphagia. No abdominal pain. No hematochezia. Takes iron and stool is dark on iron. Would like to go ahead and do a colonoscopy now. No constipation or diarrhea. No changes in bowel habits. Usually has a BM in morning right after breakfast.   Past Medical History:  Diagnosis Date  . Adrenal mass, right ([place]) 07/28/2014  . Anemia   . Bone metastases ([place]) 04/05/2016  . Cancer ([place])    lung  right  . Diabetes mellitus without complication ([place])   . GERD (gastroesophageal reflux disease)   . Hyperlipidemia   . Hypertension   . Hypothyroidism due to medication 01/30/2017  . Lung mass 07/28/2014  . Reflux     Past Surgical History:  Procedure Laterality Date  . AMPUTATION Right 02/22/2017   Procedure: PARTIAL AMPUTATION RIGHT GREAT TOE;  Surgeon: Caprice Beaver, DPM;  Location: [facility];  Service: Podiatry;  Laterality: Right;  . APPENDECTOMY    . ESOPHAGOGASTRODUODENOSCOPY N/A 12/09/2014   NFA:OZHYQM esophageal stricture/mild-to-noderate erosive gastritis. negative  H.pylori  . FLEXIBLE SIGMOIDOSCOPY  2011   Dr. [name]: hyperplastic polyp  . LUNG BIOPSY Right 07/2014   CT guided  . MALONEY DILATION N/A 12/09/2014   Procedure: [name] DILATION;  Surgeon: [name], MD;  [facility];  Service: Endoscopy;  Laterality: N/A;  . PORTACATH PLACEMENT Left 09/01/2014  . SAVORY DILATION N/A 12/09/2014   Procedure: SAVORY DILATION;  Surgeon: [name], MD;  Location: [facility];  Service: Endoscopy;  Laterality: N/A;    Current Outpatient Medications  Medication Sig Dispense Refill  . albuterol (VENTOLIN HFA) 108 (90 Base) MCG/ACT inhaler INHALE 2 PUFFS BY MOUTH EVERY 6 HOURS AS NEEDED FOR WHEEZING FOR SHORTNESS OF BREATH    . dexlansoprazole (DEXILANT) 60 MG capsule 1 PO EVERY MORNING WITH BREAKFAST. 30 capsule 11  . dronabinol (MARINOL) 2.5 MG capsule Take 1 capsule (2.5 mg total) by mouth 2 (two) times daily before lunch and supper. 60 capsule 2  . ENSURE (ENSURE) Take 1 Can by mouth 4 (four) times daily.    . Ferrous Sulfate (IRON) 325 (65 Fe) MG TABS Take by mouth daily.    . Fluticasone-Salmeterol (ADVAIR DISKUS) 500-50 MCG/DOSE AEPB Inhale 1 puff into the lungs 2 (two) times daily. 60 each 6  . ibuprofen (ADVIL,MOTRIN) 200 MG tablet Take 400 mg by mouth every 8 (eight) hours as needed (for pain.).     Marland Kitchen levocetirizine (XYZAL) 5 MG tablet TAKE 1 TABLET BY MOUTH AT BEDTIME FOR ALLERGIES COUGH    .  levothyroxine (SYNTHROID) 25 MCG tablet TAKE 1/2 (ONE-HALF) TABLET BY MOUTH ONCE DAILY BEFORE BREAKFAST 45 tablet 0  . magic mouthwash SOLN Take 5 mLs by mouth 4 (four) times daily as needed for mouth pain. 480 mL 2  . Nivolumab (OPDIVO IV) Inject into the vein every 28 (twenty-eight) days.    . Oxycodone HCl 10 MG TABS Take 1-2 tablets (10-20 mg total) by mouth every 12 (twelve) hours as needed (pain.). 60 tablet 0  . rosuvastatin (CRESTOR) 20 MG tablet Take 20 mg by mouth daily.     No current facility-administered medications for this  visit.   Facility-Administered Medications Ordered in Other Visits  Medication Dose Route Frequency Provider Last Rate Last Admin  . sodium chloride flush (NS) 0.9 % injection 10 mL  10 mL Intracatheter PRN Derek Jack, MD   10 mL at 01/03/19 0930    Allergies as of 08/25/2020 - Review Complete 08/25/2020  Allergen Reaction Noted  . Xgeva [denosumab]  08/15/2018  . Omeprazole  02/20/2020    Family History  Problem Relation Age of Onset  . Cancer Sister   . Colon cancer Neg Hx     Social History   Socioeconomic History  . Marital status: Married    Spouse name: Not on file  . Number of children: Not on file  . Years of education: Not on file  . Highest education level: Not on file  Occupational History  . Not on file  Tobacco Use  . Smoking status: Light Tobacco Smoker    Packs/day: 0.30    Years: 20.00    Pack years: 6.00    Types: Cigarettes  . Smokeless tobacco: Never Used  Vaping Use  . Vaping Use: Never used  Substance and Sexual Activity  . Alcohol use: No  . Drug use: No  . Sexual activity: Not on file  Other Topics Concern  . Not on file  Social History Narrative  . Not on file   Social Determinants of Health   Financial Resource Strain:   . Difficulty of Paying Living Expenses: Not on file  Food Insecurity:   . Worried About Charity fundraiser in the Last Year: Not on file  . Ran Out of Food in the Last Year: Not on file  Transportation Needs:   . Lack of Transportation (Medical): Not on file  . Lack of Transportation (Non-Medical): Not on file  Physical Activity:   . Days of Exercise per Week: Not on file  . Minutes of Exercise per Session: Not on file  Stress:   . Feeling of Stress : Not on file  Social Connections:   . Frequency of Communication with Friends and Family: Not on file  . Frequency of Social Gatherings with Friends and Family: Not on file  . Attends Religious Services: Not on file  . Active Member of Clubs or  Organizations: Not on file  . Attends Archivist Meetings: Not on file  . Marital Status: Not on file    Review of Systems: Gen: Denies fever, chills, anorexia. Denies fatigue, weakness, weight loss.  CV: Denies chest pain, palpitations, syncope, peripheral edema, and claudication. Resp: +DOE GI: see HPI Derm: Denies rash, itching, dry skin Psych: Denies depression, anxiety, memory loss, confusion. No homicidal or suicidal ideation.  Heme: Denies bruising, bleeding, and enlarged lymph nodes.  Physical Exam: BP 114/71   Pulse 91   Temp (!) 97.3 F (36.3 C) (Temporal)   Ht 4\' 11"  (1.499 m)  Wt 103 lb (46.7 kg)   BMI 20.80 kg/m  General:   Alert and oriented. No distress noted. Pleasant and cooperative.  Head:  Normocephalic and atraumatic. Eyes:  Conjuctiva clear without scleral icterus. Mouth:  Mask in place Lungs: clear bilaterally Cardiac: S1 S2 present without murmurs Abdomen:  +BS, soft, non-tender and non-distended. No rebound or guarding. No HSM or masses noted. Msk:  Symmetrical without gross deformities. Normal posture. Extremities:  Without edema. Neurologic:  Alert and  oriented x4 Psych:  Alert and cooperative. Normal mood and affect.  ASSESSMENT: Melissa Sullivan is a 64 y.o. female presenting today with long-standing history of GERD, now well-controlled on Dexilant, history of hyperplastic polyp on flex sig in 2011. Followed by Oncology for stage IV lung adenocarcinoma to the bones and right adrenal She has recently seen Oncology with CT abd/pelvis without recurrent metastatic disease in abdomen or pelvis, irregularly mildly enlarged right adrenal gland unchanged. CT chest due 10/20. She continues to undergo chemo and tolerating well. Desires colonoscopy now.   PLAN:  Proceed with colonoscopy by Dr. Abbey Chatters  in near future: the risks, benefits, and alternatives have been discussed with the patient in detail. The patient states understanding and desires to  proceed.   Hold iron 7 days prior  Continue Dexilant daily  Return in 6-8 months   Annitta Needs, PhD, [facility]

## 2020-09-09 ENCOUNTER — Inpatient Hospital Stay (HOSPITAL_COMMUNITY): Payer: Medicare HMO | Attending: Hematology

## 2020-09-09 ENCOUNTER — Ambulatory Visit (HOSPITAL_COMMUNITY)
Admission: RE | Admit: 2020-09-09 | Discharge: 2020-09-09 | Disposition: A | Discharge status: Home / Self Care | Payer: Medicare HMO | Source: Ambulatory Visit | Attending: Hematology | Admitting: Hematology

## 2020-09-09 ENCOUNTER — Other Ambulatory Visit: Payer: Self-pay

## 2020-09-09 DIAGNOSIS — E278 Other specified disorders of adrenal gland: Secondary | ICD-10-CM

## 2020-09-09 DIAGNOSIS — Z23 Encounter for immunization: Secondary | ICD-10-CM | POA: Insufficient documentation

## 2020-09-09 DIAGNOSIS — Z923 Personal history of irradiation: Secondary | ICD-10-CM | POA: Diagnosis not present

## 2020-09-09 DIAGNOSIS — C3411 Malignant neoplasm of upper lobe, right bronchus or lung: Secondary | ICD-10-CM

## 2020-09-09 DIAGNOSIS — J984 Other disorders of lung: Secondary | ICD-10-CM | POA: Diagnosis not present

## 2020-09-09 DIAGNOSIS — C7951 Secondary malignant neoplasm of bone: Secondary | ICD-10-CM | POA: Diagnosis not present

## 2020-09-09 DIAGNOSIS — C3490 Malignant neoplasm of unspecified part of unspecified bronchus or lung: Secondary | ICD-10-CM | POA: Diagnosis not present

## 2020-09-09 DIAGNOSIS — I700 Atherosclerosis of aorta: Secondary | ICD-10-CM | POA: Diagnosis not present

## 2020-09-09 DIAGNOSIS — Z5112 Encounter for antineoplastic immunotherapy: Secondary | ICD-10-CM | POA: Diagnosis not present

## 2020-09-09 DIAGNOSIS — C7971 Secondary malignant neoplasm of right adrenal gland: Secondary | ICD-10-CM | POA: Insufficient documentation

## 2020-09-09 DIAGNOSIS — R69 Illness, unspecified: Secondary | ICD-10-CM | POA: Diagnosis not present

## 2020-09-09 DIAGNOSIS — Z79899 Other long term (current) drug therapy: Secondary | ICD-10-CM | POA: Diagnosis not present

## 2020-09-09 DIAGNOSIS — J479 Bronchiectasis, uncomplicated: Secondary | ICD-10-CM | POA: Diagnosis not present

## 2020-09-09 DIAGNOSIS — F17210 Nicotine dependence, cigarettes, uncomplicated: Secondary | ICD-10-CM | POA: Insufficient documentation

## 2020-09-09 DIAGNOSIS — Z9221 Personal history of antineoplastic chemotherapy: Secondary | ICD-10-CM | POA: Insufficient documentation

## 2020-09-09 LAB — COMPREHENSIVE METABOLIC PANEL
ALT: 26 U/L (ref 0–44)
AST: 30 U/L (ref 15–41)
Albumin: 3.8 g/dL (ref 3.5–5.0)
Alkaline Phosphatase: 65 U/L (ref 38–126)
Anion gap: 11 (ref 5–15)
BUN: 10 mg/dL (ref 8–23)
CO2: 24 mmol/L (ref 22–32)
Calcium: 9.2 mg/dL (ref 8.9–10.3)
Chloride: 105 mmol/L (ref 98–111)
Creatinine, Ser: 1.2 mg/dL — ABNORMAL HIGH (ref 0.44–1.00)
GFR, Estimated: 48 mL/min — ABNORMAL LOW (ref >60)
Glucose, Bld: 78 mg/dL (ref 70–99)
Potassium: 4.2 mmol/L (ref 3.5–5.1)
Sodium: 140 mmol/L (ref 135–145)
Total Bilirubin: 0.8 mg/dL (ref 0.3–1.2)
Total Protein: 7.5 g/dL (ref 6.5–8.1)

## 2020-09-09 LAB — CBC WITH DIFFERENTIAL/PLATELET
Abs Immature Granulocytes: 0.04 10*3/uL (ref 0.00–0.07)
Basophils Absolute: 0.1 10*3/uL (ref 0.0–0.1)
Basophils Relative: 1 %
Eosinophils Absolute: 0.2 10*3/uL (ref 0.0–0.5)
Eosinophils Relative: 2 %
HCT: 41 % (ref 36.0–46.0)
Hemoglobin: 13.3 g/dL (ref 12.0–15.0)
Immature Granulocytes: 0 %
Lymphocytes Relative: 27 %
Lymphs Abs: 3 10*3/uL (ref 0.7–4.0)
MCH: 30.9 pg (ref 26.0–34.0)
MCHC: 32.4 g/dL (ref 30.0–36.0)
MCV: 95.3 fL (ref 80.0–100.0)
Monocytes Absolute: 0.7 10*3/uL (ref 0.1–1.0)
Monocytes Relative: 6 %
Neutro Abs: 6.9 10*3/uL (ref 1.7–7.7)
Neutrophils Relative %: 64 %
Platelets: 242 10*3/uL (ref 150–400)
RBC: 4.3 MIL/uL (ref 3.87–5.11)
RDW: 15.6 % — ABNORMAL HIGH (ref 11.5–15.5)
WBC: 10.9 10*3/uL — ABNORMAL HIGH (ref 4.0–10.5)
nRBC: 0 % (ref 0.0–0.2)

## 2020-09-09 LAB — TSH: TSH: 1.9 u[IU]/mL (ref 0.350–4.500)

## 2020-09-09 MED ORDER — IOHEXOL 300 MG/ML  SOLN
75.0000 mL | Freq: Once | INTRAMUSCULAR | Status: AC | PRN
  Administered 2020-09-09: 75 mL via INTRAVENOUS

## 2020-09-10 ENCOUNTER — Inpatient Hospital Stay (HOSPITAL_BASED_OUTPATIENT_CLINIC_OR_DEPARTMENT_OTHER): Payer: Medicare HMO | Admitting: Hematology

## 2020-09-10 ENCOUNTER — Other Ambulatory Visit (HOSPITAL_COMMUNITY): Payer: Medicare HMO

## 2020-09-10 ENCOUNTER — Inpatient Hospital Stay (HOSPITAL_COMMUNITY): Payer: Medicare HMO

## 2020-09-10 VITALS — BP 128/68 | HR 82 | Temp 97.0°F | Resp 17 | Wt 102.2 lb

## 2020-09-10 VITALS — BP 133/60 | HR 75 | Temp 97.0°F | Resp 16

## 2020-09-10 DIAGNOSIS — Z5112 Encounter for antineoplastic immunotherapy: Secondary | ICD-10-CM | POA: Diagnosis not present

## 2020-09-10 DIAGNOSIS — C3411 Malignant neoplasm of upper lobe, right bronchus or lung: Secondary | ICD-10-CM

## 2020-09-10 DIAGNOSIS — E278 Other specified disorders of adrenal gland: Secondary | ICD-10-CM

## 2020-09-10 MED ORDER — INFLUENZA VAC SPLIT QUAD 0.5 ML IM SUSY
0.5000 mL | PREFILLED_SYRINGE | Freq: Once | INTRAMUSCULAR | Status: AC
  Administered 2020-09-10: 0.5 mL via INTRAMUSCULAR
  Filled 2020-09-10: qty 0.5

## 2020-09-10 MED ORDER — SODIUM CHLORIDE 0.9% FLUSH
10.0000 mL | INTRAVENOUS | Status: DC | PRN
  Administered 2020-09-10: 10 mL

## 2020-09-10 MED ORDER — SODIUM CHLORIDE 0.9 % IV SOLN
480.0000 mg | Freq: Once | INTRAVENOUS | Status: AC
  Administered 2020-09-10: 480 mg via INTRAVENOUS
  Filled 2020-09-10: qty 48

## 2020-09-10 MED ORDER — HEPARIN SOD (PORK) LOCK FLUSH 100 UNIT/ML IV SOLN
500.0000 [IU] | Freq: Once | INTRAVENOUS | Status: AC | PRN
  Administered 2020-09-10: 500 [IU]

## 2020-09-10 MED ORDER — SODIUM CHLORIDE 0.9 % IV SOLN: Freq: Once | INTRAVENOUS | Status: AC

## 2020-09-10 NOTE — Progress Notes (Signed)
Melissa Sullivan presents today for D1C37 Opdivo. Pt denies any new changes or symptoms since last treatment. Lab results and vitals have been reviewed and are stable and within parameters for treatment. Patient has been assessed by Dr. Delton Coombes who has approved proceeding with treatment today as planned.  Infusion tolerated without incident or complaint. VSS upon completion of treatment. Port flushed and deaccessed per protocol, see MAR and IV flowsheet for details. Discharged in satisfactory condition with follow up instructions.

## 2020-09-10 NOTE — Progress Notes (Signed)
Melissa Sullivan [address] [place] [address]   CLINIC:  Medical Oncology/Hematology  PCP:  [name], MD [address] Melissa [place] [address] [phone]   REASON FOR VISIT:  Follow-up for metastatic right lung cancer  PRIOR THERAPY:  1. Bevacizumab, cisplatin and pemetrexed x 4 cycles from 09/02/2014 to 11/04/2014. 2. Bevacizumab and pemetrexed x 3 cycles from 12/24/2014 to 02/04/2015.  NGS Results: Not done  CURRENT THERAPY: Nivolumab every 4 weeks  BRIEF ONCOLOGIC HISTORY:  Oncology History  Cancer of upper lobe of right lung ([place])  07/28/2014 Imaging   CT chest: Large R apical mass consistent with malignancy. This is destroying the R 2nd rib with extension into adjacent soft tissue. R hilar adenopathy with R 5cm adrenal metastatic lesion.   08/01/2014 Initial Biopsy   Lung, needle/core biopsy(ies), right upper lobe - POORLY DIFFERENTIATED ADENOCARCINOMA, SEE COMMENT.   08/08/2014 PET scan   Large hypermetabolic R apical mass with evidence of direct chest wall and mediastinal invasion, right retrocrural lymphadenopathy, extensive retroperitoneal lymphadenopathy, and metastatic lesions to the adrenal glands    09/02/2014 - 11/04/2014 Chemotherapy   Cisplatin/Pemetrexed/Avastin every 21 days x 4 cycles   10/07/2014 - 10/27/2014 Radiation Therapy   Right lung apex for control of brachioplexopathy.   12/24/2014 - 02/25/2015 Chemotherapy   Alimta/Avastin every 21 days.   02/20/2015 Imaging   Increase in size of right adrenal metastasis and subjacent confluent retrocaval lymphadenopathy   02/25/2015 -  Chemotherapy   Nivolumab, zometa   05/04/2015 Imaging   CT CAP- Stable to slight decrease in the posterior right apical lesion. Stable appearance of posterior right upper rib an upper thoracic bony lesions. Slight improvement in right upper lobe tree-in-bud opacity. No new or progressive findings in...   07/28/2015 Imaging   CT CAP- Reduced size of the  right apical pleural parenchymal lesion and reduced size of the right adrenal metastatic lesion. Resolution of prior retrocrural adenopathy.  Right eccentric T1 and T2 sclerosis with sclerosis and tapering of the right second..   11/17/2015 Imaging   CT CAP- Stable soft tissue thickening in the apex of the right hemi thorax. Stable right adrenal metastasis. Nodularity along the trachea and mainstem bronchi, relatively new from 07/28/2015, favoring adherent debris.   11/18/2015 Treatment Plan Change   Zometa HELD for upcoming tooth extraction   11/24/2015 Treatment Plan Change   Zometa on hold at this time in preparation for tooth extraction in March 2017.  Zometa las given on 11/18/2015.   02/03/2016 Imaging   CT CAP- Heterogeneous right apical masslike consolidation and right adrenal metastasis are unchanged   04/06/2016 Treatment Plan Change   Zometa restarted 6 weeks out from tooth extraction (04/06/2016)   05/12/2016 Imaging   CT CAP- NED in the chest, abdomen or pelvis.  Some areas of nodularity associated with the mainstem bronchi in the left upper lobe bronchus, favored to represent adherent inspissated secretions   08/17/2016 Imaging   CT CAP- 1. Stable CTs of the chest and abdomen. No evidence of progressive metastatic disease. 2. Probable treated tumor at the right apex, right adrenal gland and T2 vertebral body, stable. 3. Fluctuating nodularity along the walls of the trachea and mainstem bronchi, likely secretions.   12/12/2016 Imaging   Further decrease in size of treated tumor within the right apex. 2. Stable treated tumor involving the right second rib and T2 vertebra. 3. Stable right adrenal gland treated tumor. 4. Emphysema 5. Aortic atherosclerosis  01/04/2017 Imaging   MRI brain- Normal brain MRI.  No intracranial metastatic disease.   03/15/2017 Imaging   CT CAP- 1. No new or progressive metastatic disease in the chest or abdomen. 2. Stable treated tumor in the  apical right upper lobe. Stable treated right posterior second rib and right T2 vertebral lesions. Stable treated right adrenal metastasis. 3. Aortic atherosclerosis. 4. Moderate emphysema with mild diffuse bronchial wall thickening, suggesting COPD.   06/12/2017 Imaging   CT CAP 1. Similar appearance of treated primary within the right apex. 2. Similar areas of sclerosis within the right second rib, T2, and less so T1 vertebral bodies. These are most consistent with treated metastasis. 3. Similar right adrenal treated metastasis. 4. No evidence of new or progressive disease. 5. Similar right and progressive left areas of bronchial wall thickening and probable mucoid impaction. Correlate with interval infectious symptoms. 6.  Emphysema (ICD10-J43.9). 7. Coronary artery atherosclerosis. Aortic Atherosclerosis (ICD10-I70.0).    10/16/2017 Imaging   CT CAP 1. Stable appearance of the prior Pancoast tumor and related bony findings in the right second rib and right T1 and T2 vertebra compatible with successfully treated tumor. No significant enlargement or new lesions are identified. Similarly the treated right adrenal metastatic lesion is stable in appearance. 2. Upper normal size right hilar lymph node may warrant surveillance. Currently 9 mm in short axis. 3. Other imaging findings of potential clinical significance: Aortic Atherosclerosis (ICD10-I70.0) and Emphysema (ICD10-J43.9). Scattered proximal sigmoid colon diverticula. Mucus plugging medially in the left upper lobe and posteriorly in the right upper lobe.      CANCER STAGING: Cancer Staging No matching staging information was found for the patient.  INTERVAL HISTORY:  Melissa Sullivan, a 64 y.o. female, returns for routine follow-up and consideration for next cycle of immunotherapy. Melissa Sullivan was last seen on 08/13/2020.  Due for cycle #37 of nivolumab today.   Overall, she tells me she has been feeling  pretty well. Her voice has been gone since 10/18 after she developed sharp pain in both sides of her jaw below her ears on 10/17; she denies having sore throat though she usually gets allergies every fall. She denies changes in her BM's or diarrhea. Her appetite is excellent.  Overall, she feels ready for next cycle of immunotherapy today.    REVIEW OF SYSTEMS:  Review of Systems  Constitutional: Negative for appetite change and fatigue.  HENT:   Positive for trouble swallowing (issues chewing d/t teeth). Negative for sore throat.   Gastrointestinal: Negative for diarrhea.  Musculoskeletal: Negative for arthralgias.  All other systems reviewed and are negative.   PAST MEDICAL/SURGICAL HISTORY:  Past Medical History:  Diagnosis Date  . Adrenal mass, right ([place]) 07/28/2014  . Anemia   . Bone metastases ([place]) 04/05/2016  . Cancer ([place])    lung  right  . Diabetes mellitus without complication ([place])   . GERD (gastroesophageal reflux disease)   . Hyperlipidemia   . Hypertension   . Hypothyroidism due to medication 01/30/2017  . Lung mass 07/28/2014  . Reflux    Past Surgical History:  Procedure Laterality Date  . AMPUTATION Right 02/22/2017   Procedure: PARTIAL AMPUTATION RIGHT GREAT TOE;  Surgeon: Melissa Sullivan Beaver, DPM;  Location: [facility]S;  Service: Podiatry;  Laterality: Right;  . APPENDECTOMY    . ESOPHAGOGASTRODUODENOSCOPY N/A 12/09/2014   SHF:WYOVZC esophageal stricture/mild-to-noderate erosive gastritis. negative H.pylori  . FLEXIBLE SIGMOIDOSCOPY  2011   Dr. [name]: hyperplastic polyp  . LUNG BIOPSY Right 07/2014  CT guided  . MALONEY DILATION N/A 12/09/2014   Procedure: [name] DILATION;  Surgeon: [name], MD;  Location: [facility];  Service: Endoscopy;  Laterality: N/A;  . PORTACATH PLACEMENT Left 09/01/2014  . SAVORY DILATION N/A 12/09/2014   Procedure: SAVORY DILATION;  Surgeon: [name], MD;  Location: [facility];  Service: Endoscopy;  Laterality: N/A;     SOCIAL HISTORY:  Social History   Socioeconomic History  . Marital status: Married    Spouse name: Not on file  . Number of children: Not on file  . Years of education: Not on file  . Highest education level: Not on file  Occupational History  . Not on file  Tobacco Use  . Smoking status: Light Tobacco Smoker    Packs/day: 0.30    Years: 20.00    Pack years: 6.00    Types: Cigarettes  . Smokeless tobacco: Never Used  Vaping Use  . Vaping Use: Never used  Substance and Sexual Activity  . Alcohol use: No  . Drug use: No  . Sexual activity: Not on file  Other Topics Concern  . Not on file  Social History Narrative  . Not on file   Social Determinants of Health   Financial Resource Strain:   . Difficulty of Paying Living Expenses: Not on file  Food Insecurity:   . Worried About Charity fundraiser in the Last Year: Not on file  . Ran Out of Food in the Last Year: Not on file  Transportation Needs:   . Lack of Transportation (Medical): Not on file  . Lack of Transportation (Non-Medical): Not on file  Physical Activity:   . Days of Exercise per Week: Not on file  . Minutes of Exercise per Session: Not on file  Stress:   . Feeling of Stress : Not on file  Social Connections:   . Frequency of Communication with Friends and Family: Not on file  . Frequency of Social Gatherings with Friends and Family: Not on file  . Attends Religious Services: Not on file  . Active Member of Clubs or Organizations: Not on file  . Attends Archivist Meetings: Not on file  . Marital Status: Not on file  Intimate Partner Violence:   . Fear of Current or Ex-Partner: Not on file  . Emotionally Abused: Not on file  . Physically Abused: Not on file  . Sexually Abused: Not on file    FAMILY HISTORY:  Family History  Problem Relation Age of Onset  . Cancer Sister   . Colon cancer Neg Hx     CURRENT MEDICATIONS:  Current Outpatient Medications  Medication Sig Dispense  Refill  . dexlansoprazole (DEXILANT) 60 MG capsule 1 PO EVERY MORNING WITH BREAKFAST. 30 capsule 11  . dronabinol (MARINOL) 2.5 MG capsule Take 1 capsule (2.5 mg total) by mouth 2 (two) times daily before lunch and supper. 60 capsule 2  . ENSURE (ENSURE) Take 1 Can by mouth 4 (four) times daily.    . Ferrous Sulfate (IRON) 325 (65 Fe) MG TABS Take by mouth daily.    . Fluticasone-Salmeterol (ADVAIR DISKUS) 500-50 MCG/DOSE AEPB Inhale 1 puff into the lungs 2 (two) times daily. 60 each 6  . ibuprofen (ADVIL,MOTRIN) 200 MG tablet Take 400 mg by mouth every 8 (eight) hours as needed (for pain.).     Marland Kitchen levocetirizine (XYZAL) 5 MG tablet TAKE 1 TABLET BY MOUTH AT BEDTIME FOR ALLERGIES COUGH    .  levothyroxine (SYNTHROID) 25 MCG tablet TAKE 1/2 (ONE-HALF) TABLET BY MOUTH ONCE DAILY BEFORE BREAKFAST 45 tablet 0  . magic mouthwash SOLN Take 5 mLs by mouth 4 (four) times daily as needed for mouth pain. 480 mL 2  . Nivolumab (OPDIVO IV) Inject into the vein every 28 (twenty-eight) days.    . Oxycodone HCl 10 MG TABS Take 1-2 tablets (10-20 mg total) by mouth every 12 (twelve) hours as needed (pain.). 60 tablet 0  . rosuvastatin (CRESTOR) 20 MG tablet Take 20 mg by mouth daily.    Marland Kitchen albuterol (VENTOLIN HFA) 108 (90 Base) MCG/ACT inhaler INHALE 2 PUFFS BY MOUTH EVERY 6 HOURS AS NEEDED FOR WHEEZING FOR SHORTNESS OF BREATH (Patient not taking: Reported on 09/10/2020)     Current Facility-Administered Medications  Medication Dose Route Frequency Provider Last Rate Last Admin  . influenza vac split quadrivalent PF (FLUARIX) injection 0.5 mL  0.5 mL Intramuscular Once Derek Jack, MD       Facility-Administered Medications Ordered in Other Visits  Medication Dose Route Frequency Provider Last Rate Last Admin  . sodium chloride flush (NS) 0.9 % injection 10 mL  10 mL Intracatheter PRN Derek Jack, MD   10 mL at 01/03/19 0930    ALLERGIES:  Allergies  Allergen Reactions  . Xgeva [Denosumab]      osteonecrosis  . Omeprazole     Made legs hurt    PHYSICAL EXAM:  Performance status (ECOG): 0 - Asymptomatic  Vitals:   09/10/20 0956  BP: 128/68  Pulse: 82  Resp: 17  Temp: (!) 97 F (36.1 C)  SpO2: 100%   Wt Readings from Last 3 Encounters:  09/10/20 102 lb 3.2 oz (46.4 kg)  08/25/20 103 lb (46.7 kg)  08/13/20 103 lb 6.4 oz (46.9 kg)   Physical Exam Vitals reviewed.  Constitutional:      Appearance: Normal appearance.  HENT:     Mouth/Throat:     Lips: No lesions.     Mouth: No oral lesions.     Dentition: No gum lesions.     Tongue: No lesions.     Palate: No lesions.     Pharynx: No posterior oropharyngeal erythema.  Cardiovascular:     Rate and Rhythm: Normal rate and regular rhythm.     Pulses: Normal pulses.     Heart sounds: Normal heart sounds.  Pulmonary:     Effort: Pulmonary effort is normal.     Breath sounds: Normal breath sounds.  Abdominal:     Palpations: Abdomen is soft. There is no hepatomegaly, splenomegaly or mass.     Tenderness: There is no abdominal tenderness.     Hernia: No hernia is present.  Musculoskeletal:     Right lower leg: No edema.     Left lower leg: No edema.  Neurological:     General: No focal deficit present.     Mental Status: She is alert and oriented to person, place, and time.  Psychiatric:        Mood and Affect: Mood normal.        Behavior: Behavior normal.     LABORATORY DATA:  I have reviewed the labs as listed.  CBC Latest Ref Rng & Units 09/09/2020 08/13/2020 07/16/2020  WBC 4.0 - 10.5 K/uL 10.9(H) 8.5 8.6  Hemoglobin 12.0 - 15.0 g/dL 13.3 12.7 13.8  Hematocrit 36 - 46 % 41.0 39.7 43.3  Platelets 150 - 400 K/uL 242 364 293   CMP Latest Ref Rng &  Units 09/09/2020 08/13/2020 07/16/2020  Glucose 70 - 99 mg/dL 78 114(H) 105(H)  BUN 8 - 23 mg/dL 10 11 14   Creatinine 0.44 - 1.00 mg/dL 1.20(H) 1.16(H) 1.26(H)  Sodium 135 - 145 mmol/L 140 137 138  Potassium 3.5 - 5.1 mmol/L 4.2 3.4(L) 3.7  Chloride 98 -  111 mmol/L 105 105 106  CO2 22 - 32 mmol/L 24 25 23   Calcium 8.9 - 10.3 mg/dL 9.2 9.2 9.4  Total Protein 6.5 - 8.1 g/dL 7.5 7.5 7.8  Total Bilirubin 0.3 - 1.2 mg/dL 0.8 0.4 0.3  Alkaline Phos 38 - 126 U/L 65 65 61  AST 15 - 41 U/L 30 15 13(L)  ALT 0 - 44 U/L 26 14 9     DIAGNOSTIC IMAGING:  I have independently reviewed the scans and discussed with the patient. CT Chest W Contrast  Result Date: 09/09/2020 CLINICAL DATA:  Stage IV metastatic adenocarcinoma of the lung initially diagnosed in September 2015. Restaging post chemotherapy and radiation therapy. EXAM: CT CHEST WITH CONTRAST TECHNIQUE: Multidetector CT imaging of the chest was performed during intravenous contrast administration. CONTRAST:  13mL OMNIPAQUE IOHEXOL 300 MG/ML  SOLN COMPARISON:  Chest and abdominal CT 02/24/2020. Abdominopelvic CT 08/10/2020. FINDINGS: Cardiovascular: Atherosclerosis of the aorta, great vessels and coronary arteries. No acute vascular findings. Right IJ Port-A-Cath extends to the mid SVC. The heart size is normal. There is no pericardial effusion. Mediastinum/Nodes: There are no enlarged mediastinal, hilar or axillary lymph nodes.Small mediastinal and bilateral hilar lymph nodes are unchanged. The thyroid gland, trachea and esophagus demonstrate no significant findings. Lungs/Pleura: No pleural effusion or pneumothorax. Again demonstrated is moderate to severe upper lobe predominant centrilobular emphysema. There is stable partially calcified pleuroparenchymal scarring at the right apex. There is an adjacent new irregular nodule posteriorly at the right apex, measuring up to 2.0 x 0.8 cm on image 26/4. There is underlying cylindrical bronchiectasis in the right middle lobe, lingula and both lower lobes. The associated airspace opacities medially in the lingula and left lower lobe on the most recent abdominal CT have partially cleared, consistent with resolving inflammation/infection. There are components of  underlying chronic atelectasis or scarring in the right middle lobe and lingula. No other enlarging pulmonary nodules are identified. Upper abdomen: Stable partially calcified right adrenal nodule measuring approximately 2.2 x 1.3 cm on image 131/2. The visualized upper abdomen otherwise appears unremarkable. Musculoskeletal/Chest wall: Stable sclerotic metastasis involving the T2 vertebral body with mild associated pathologic fracture. No new osseous lesions are seen. IMPRESSION: 1. Partial clearing of the airspace opacities noted medially in the lingula and left lower lobe on abdominal CT of 1 month prior, consistent with resolving inflammation/infection. 2. New irregular nodule posteriorly at the right apex, adjacent to chronic partially calcified pleural thickening. Unless there has been recent radiation to the T2 lesion, this is unlikely treatment related. It could be postinflammatory. Primary malignancy unlikely given the absence of any abnormality in this area 6 months ago. Recommend follow-up chest CT in 3-6 months. 3. Stable sclerotic metastasis involving the T2 vertebral body with mild associated pathologic fracture. Stable partially calcified right adrenal nodule. 4. Stable chronic lung disease with emphysema, bronchiectasis and scattered scarring. 5. Aortic Atherosclerosis (ICD10-I70.0) and Emphysema (ICD10-J43.9). Electronically Signed   By: Richardean Sale M.D.   On: 09/09/2020 19:46     ASSESSMENT:  1. Cancer of upper lobe of right lung [facility]) Metastatic adenocarcinoma of the lung to the bones and right adrenal: -Foundation 1 testing shows 14 genomic alterations, no  approved therapies. -Opdivo 480 mg monthly started on February 25, 2015. -CT CAP on February 21, 2020 shows no evidence of progression or metastatic disease. T2 sclerotic lesion is stable. -CTAP on 08/10/2020 with no evidence of recurrent metastatic disease in the abdomen or pelvis.  Irregular mildly enlarged right adrenal gland  unchanged. -CT chest on 09/09/2020 showed partial clearing of the airspace opacities noted medially in the lingula and left lower lobe.  New irregular nodule posteriorly at the right apex likely inflammatory.  Stable sclerotic T2 vertebral body lesion.   PLAN:  1. Metastatic lung adenocarcinoma: -She denies any immunotherapy related side effects. -Review of labs showed normal LFTs.  White count is slightly high at 10.9.  TSH is 1.9. -Reviewed CT chest results from 09/09/2020.  New irregular nodule posteriorly in the right apex likely postinflammatory. -She will proceed with monthly nivolumab.  I plan to see her back in 3 months for follow-up.  Plan to repeat CT scan in 6 months. -She reported sharp pain at bilateral temporomandibular joints last week.  This was 1 episode.  Since then her voice is hoarse.  She reports that she also has allergies during this time.  I have recommended to try taking Claritin.  If there is no improvement recommended ENT follow-up.  2. High risk drug monitoring: -TSH today is 1.9.  Continue close monitoring.  3. Health maintenance: -Mammogram on 06/23/2020 was BI-RADS Category 1.  4. CKD: -Baseline creatinine between 1.2-1.3.  5. Bone metastasis: -Not currently on bisphosphonates due to ONJ in 2019.   Orders placed this encounter:  No orders of the defined types were placed in this encounter.    Derek Jack, MD [place] [phone]   I, Milinda Antis, am acting as a scribe for Dr. Sanda Linger.  I, Derek Jack MD, have reviewed the above documentation for accuracy and completeness, and I agree with the above.

## 2020-09-10 NOTE — Patient Instructions (Signed)
[facility] at [facility] Discharge Instructions  You were seen today by Dr. Delton Coombes. He went over your recent results and scans. You received your treatment today; continue receiving your monthly treatment. Dr. Delton Coombes will see you back in 3 months for labs and follow up.   Thank you for choosing [place] at [facility] to provide your oncology and hematology care.  To afford each patient quality time with our provider, please arrive at least 15 minutes before your scheduled appointment time.   If you have a lab appointment with the [facility] please come in thru the Main Entrance and check in at the main information desk  You need to re-schedule your appointment should you arrive 10 or more minutes late.  We strive to give you quality time with our providers, and arriving late affects you and other patients whose appointments are after yours.  Also, if you no show three or more times for appointments you may be dismissed from the clinic at the providers discretion.     Again, thank you for choosing [facility].  Our hope is that these requests will decrease the amount of time that you wait before being seen by our physicians.       _____________________________________________________________  Should you have questions after your visit to [facility], please contact our office at [phone] between the hours of 8:00 a.m. and 4:30 p.m.  Voicemails left after 4:00 p.m. will not be returned until the following business day.  For prescription refill requests, have your pharmacy contact our office and allow 72 hours.    Cancer Center Support Programs:   > Cancer Support Group  2nd Tuesday of the month 1pm-2pm, Journey Room

## 2020-09-10 NOTE — Progress Notes (Signed)
Patient was assessed by Dr. Katragadda and labs have been reviewed.  Patient is okay to proceed with treatment today. Primary RN and pharmacy aware.   

## 2020-09-10 NOTE — Patient Instructions (Signed)
[facility] Discharge Instructions for Patients Receiving Chemotherapy   Beginning January 23rd 2017 lab work for the [facility] will be done in the  Main lab at [facility] on 1st floor. If you have a lab appointment with the [facility] please come in thru the  Main Entrance and check in at the main information desk   Today you received the following chemotherapy agents Opdivo  To help prevent nausea and vomiting after your treatment, we encourage you to take your nausea medication    If you develop nausea and vomiting, or diarrhea that is not controlled by your medication, call the clinic.  The clinic phone number is [phone]. Office hours are Monday-Friday 8:30am-5:00pm.  BELOW ARE SYMPTOMS THAT SHOULD BE REPORTED IMMEDIATELY:  *FEVER GREATER THAN 101.0 F  *CHILLS WITH OR WITHOUT FEVER  NAUSEA AND VOMITING THAT IS NOT CONTROLLED WITH YOUR NAUSEA MEDICATION  *UNUSUAL SHORTNESS OF BREATH  *UNUSUAL BRUISING OR BLEEDING  TENDERNESS IN MOUTH AND THROAT WITH OR WITHOUT PRESENCE OF ULCERS  *URINARY PROBLEMS  *BOWEL PROBLEMS  UNUSUAL RASH Items with * indicate a potential emergency and should be followed up as soon as possible. If you have an emergency after office hours please contact your primary care physician or go to the nearest emergency department.  Please call the clinic during office hours if you have any questions or concerns.   You may also contact the Patient Navigator at [phone] should you have any questions or need assistance in obtaining follow up care.      Resources For Cancer Patients and their Caregivers ? American Cancer Society: Can assist with transportation, wigs, general needs, runs Look Good Feel Better.        7240612719 ? Cancer Care: Provides financial assistance, online support groups, medication/co-pay assistance.  1-800-813-HOPE 726-650-2557) ? [place] Assists [place] cancer  patients and their families through emotional , educational and financial support.  212-271-7873 ? [place] DSS Where to apply for food stamps, Medicaid and utility assistance. (707)197-7414 ? [facility]: Transportation to medical appointments. 970-351-6884 ? Social Security Administration: May apply for disability if have a Stage IV cancer. 5077702688 575-241-3377 ? [employer], Disability and Transit Services: Assists with nutrition, care and transit needs. 534-686-1654

## 2020-09-15 NOTE — Patient Instructions (Signed)
Melissa Sullivan  09/15/2020     @PREFPERIOPPHARMACY @   Your procedure is scheduled on 09/22/2020.  Report to [facility] at  1200  P.M.  Call this number if you have problems the morning of surgery:  [phone]   Remember:  Follow the diet and prep instructions given to you by the office.                         Take these medicines the morning of surgery with A SIP OF WATER  Dexilant, marinol, xyzal, levothyroxine, oxycodone(if needed).    Do not wear jewelry, make-up or nail polish.  Do not wear lotions, powders, or perfumes. Please wear deodorant and brush your teeth.  Do not shave 48 hours prior to surgery.  Men may shave face and neck.  Do not bring valuables to the hospital.  [facility] is not responsible for any belongings or valuables.  Contacts, dentures or bridgework may not be worn into surgery.  Leave your suitcase in the car.  After surgery it may be brought to your room.  For patients admitted to the hospital, discharge time will be determined by your treatment team.  Patients discharged the day of surgery will not be allowed to drive home.   Name and phone number of your driver:   family Special instructions:  DO NOT smoke the morning of your procedure.  Please read over the following fact sheets that you were given. Anesthesia Post-op Instructions and Care and Recovery After Surgery       Colonoscopy, Adult, Care After This sheet gives you information about how to care for yourself after your procedure. Your health care provider may also give you more specific instructions. If you have problems or questions, contact your health care provider. What can I expect after the procedure? After the procedure, it is common to have:  A small amount of blood in your stool for 24 hours after the procedure.  Some gas.  Mild cramping or bloating of your abdomen. Follow these instructions at home: Eating and drinking   Drink enough fluid to keep your  urine pale yellow.  Follow instructions from your health care provider about eating or drinking restrictions.  Resume your normal diet as instructed by your health care provider. Avoid heavy or fried foods that are hard to digest. Activity  Rest as told by your health care provider.  Avoid sitting for a long time without moving. Get up to take short walks every 1-2 hours. This is important to improve blood flow and breathing. Ask for help if you feel weak or unsteady.  Return to your normal activities as told by your health care provider. Ask your health care provider what activities are safe for you. Managing cramping and bloating   Try walking around when you have cramps or feel bloated.  Apply heat to your abdomen as told by your health care provider. Use the heat source that your health care provider recommends, such as a moist heat pack or a heating pad. ? Place a towel between your skin and the heat source. ? Leave the heat on for 20-30 minutes. ? Remove the heat if your skin turns bright red. This is especially important if you are unable to feel pain, heat, or cold. You may have a greater risk of getting burned. General instructions  For the first 24 hours after the procedure: ? Do not drive or use  machinery. ? Do not sign important documents. ? Do not drink alcohol. ? Do your regular daily activities at a slower pace than normal. ? Eat soft foods that are easy to digest.  Take over-the-counter and prescription medicines only as told by your health care provider.  Keep all follow-up visits as told by your health care provider. This is important. Contact a health care provider if:  You have blood in your stool 2-3 days after the procedure. Get help right away if you have:  More than a small spotting of blood in your stool.  Large blood clots in your stool.  Swelling of your abdomen.  Nausea or vomiting.  A fever.  Increasing pain in your abdomen that is not  relieved with medicine. Summary  After the procedure, it is common to have a small amount of blood in your stool. You may also have mild cramping and bloating of your abdomen.  For the first 24 hours after the procedure, do not drive or use machinery, sign important documents, or drink alcohol.  Get help right away if you have a lot of blood in your stool, nausea or vomiting, a fever, or increased pain in your abdomen. This information is not intended to replace advice given to you by your health care provider. Make sure you discuss any questions you have with your health care provider. Document Revised: 06/03/2019 Document Reviewed: 06/03/2019 Elsevier Patient Education  [place] After These instructions provide you with information about caring for yourself after your procedure. Your health care provider may also give you more specific instructions. Your treatment has been planned according to current medical practices, but problems sometimes occur. Call your health care provider if you have any problems or questions after your procedure. What can I expect after the procedure? After your procedure, you may:  Feel sleepy for several hours.  Feel clumsy and have poor balance for several hours.  Feel forgetful about what happened after the procedure.  Have poor judgment for several hours.  Feel nauseous or vomit.  Have a sore throat if you had a breathing tube during the procedure. Follow these instructions at home: For at least 24 hours after the procedure:      Have a responsible adult stay with you. It is important to have someone help care for you until you are awake and alert.  Rest as needed.  Do not: ? Participate in activities in which you could fall or become injured. ? Drive. ? Use heavy machinery. ? Drink alcohol. ? Take sleeping pills or medicines that cause drowsiness. ? Make important decisions or sign legal  documents. ? Take care of children on your own. Eating and drinking  Follow the diet that is recommended by your health care provider.  If you vomit, drink water, juice, or soup when you can drink without vomiting.  Make sure you have little or no nausea before eating solid foods. General instructions  Take over-the-counter and prescription medicines only as told by your health care provider.  If you have sleep apnea, surgery and certain medicines can increase your risk for breathing problems. Follow instructions from your health care provider about wearing your sleep device: ? Anytime you are sleeping, including during daytime naps. ? While taking prescription pain medicines, sleeping medicines, or medicines that make you drowsy.  If you smoke, do not smoke without supervision.  Keep all follow-up visits as told by your health care provider. This is important. Contact  a health care provider if:  You keep feeling nauseous or you keep vomiting.  You feel light-headed.  You develop a rash.  You have a fever. Get help right away if:  You have trouble breathing. Summary  For several hours after your procedure, you may feel sleepy and have poor judgment.  Have a responsible adult stay with you for at least 24 hours or until you are awake and alert. This information is not intended to replace advice given to you by your health care provider. Make sure you discuss any questions you have with your health care provider. Document Revised: 02/05/2018 Document Reviewed: 02/28/2016 Elsevier Patient Education  [place].

## 2020-09-17 ENCOUNTER — Encounter (HOSPITAL_COMMUNITY): Payer: Self-pay

## 2020-09-17 ENCOUNTER — Encounter (HOSPITAL_COMMUNITY)
Admission: RE | Admit: 2020-09-17 | Discharge: 2020-09-17 | Disposition: A | Discharge status: Home / Self Care | Payer: Medicare HMO | Source: Ambulatory Visit | Attending: Internal Medicine | Admitting: Internal Medicine

## 2020-09-17 ENCOUNTER — Other Ambulatory Visit: Payer: Self-pay

## 2020-09-17 DIAGNOSIS — R634 Abnormal weight loss: Secondary | ICD-10-CM | POA: Diagnosis not present

## 2020-09-17 DIAGNOSIS — M87180 Osteonecrosis due to drugs, jaw: Secondary | ICD-10-CM | POA: Diagnosis not present

## 2020-09-17 DIAGNOSIS — E440 Moderate protein-calorie malnutrition: Secondary | ICD-10-CM | POA: Diagnosis not present

## 2020-09-17 DIAGNOSIS — E7849 Other hyperlipidemia: Secondary | ICD-10-CM | POA: Diagnosis not present

## 2020-09-17 DIAGNOSIS — C799 Secondary malignant neoplasm of unspecified site: Secondary | ICD-10-CM | POA: Diagnosis not present

## 2020-09-17 DIAGNOSIS — D538 Other specified nutritional anemias: Secondary | ICD-10-CM | POA: Diagnosis not present

## 2020-09-17 DIAGNOSIS — E039 Hypothyroidism, unspecified: Secondary | ICD-10-CM | POA: Diagnosis not present

## 2020-09-17 DIAGNOSIS — C3490 Malignant neoplasm of unspecified part of unspecified bronchus or lung: Secondary | ICD-10-CM | POA: Diagnosis not present

## 2020-09-17 DIAGNOSIS — N1831 Chronic kidney disease, stage 3a: Secondary | ICD-10-CM | POA: Diagnosis not present

## 2020-09-21 ENCOUNTER — Telehealth: Payer: Self-pay

## 2020-09-21 ENCOUNTER — Other Ambulatory Visit: Payer: Self-pay

## 2020-09-21 ENCOUNTER — Other Ambulatory Visit (HOSPITAL_COMMUNITY)
Admission: RE | Admit: 2020-09-21 | Discharge: 2020-09-21 | Disposition: A | Discharge status: Home / Self Care | Payer: Medicare HMO | Source: Ambulatory Visit | Attending: Internal Medicine | Admitting: Internal Medicine

## 2020-09-21 DIAGNOSIS — Z20822 Contact with and (suspected) exposure to covid-19: Secondary | ICD-10-CM | POA: Insufficient documentation

## 2020-09-21 DIAGNOSIS — Z01812 Encounter for preprocedural laboratory examination: Secondary | ICD-10-CM | POA: Insufficient documentation

## 2020-09-21 LAB — SARS CORONAVIRUS 2 (TAT 6-24 HRS): SARS Coronavirus 2: NEGATIVE

## 2020-09-21 NOTE — Telephone Encounter (Signed)
Called pt, TCS scheduled for tomorrow moved up to 10:45am. Advised her to arrive at 9:15am and NPO after midnight.

## 2020-09-22 ENCOUNTER — Encounter (HOSPITAL_COMMUNITY)
Admission: RE | Disposition: A | Discharge status: Home / Self Care | Payer: Self-pay | Source: Home / Self Care | Attending: Internal Medicine

## 2020-09-22 ENCOUNTER — Ambulatory Visit (HOSPITAL_COMMUNITY): Payer: Medicare HMO | Admitting: Certified Registered"

## 2020-09-22 ENCOUNTER — Ambulatory Visit (HOSPITAL_COMMUNITY)
Admission: RE | Admit: 2020-09-22 | Discharge: 2020-09-22 | Disposition: A | Discharge status: Home / Self Care | Payer: Medicare HMO | Attending: Internal Medicine | Admitting: Internal Medicine

## 2020-09-22 ENCOUNTER — Other Ambulatory Visit: Payer: Self-pay

## 2020-09-22 ENCOUNTER — Encounter (HOSPITAL_COMMUNITY): Payer: Self-pay

## 2020-09-22 DIAGNOSIS — E039 Hypothyroidism, unspecified: Secondary | ICD-10-CM | POA: Diagnosis not present

## 2020-09-22 DIAGNOSIS — E119 Type 2 diabetes mellitus without complications: Secondary | ICD-10-CM | POA: Diagnosis not present

## 2020-09-22 DIAGNOSIS — Z8719 Personal history of other diseases of the digestive system: Secondary | ICD-10-CM | POA: Diagnosis not present

## 2020-09-22 DIAGNOSIS — F17210 Nicotine dependence, cigarettes, uncomplicated: Secondary | ICD-10-CM | POA: Diagnosis not present

## 2020-09-22 DIAGNOSIS — Z85830 Personal history of malignant neoplasm of bone: Secondary | ICD-10-CM | POA: Insufficient documentation

## 2020-09-22 DIAGNOSIS — Z85118 Personal history of other malignant neoplasm of bronchus and lung: Secondary | ICD-10-CM | POA: Insufficient documentation

## 2020-09-22 DIAGNOSIS — Z809 Family history of malignant neoplasm, unspecified: Secondary | ICD-10-CM | POA: Insufficient documentation

## 2020-09-22 DIAGNOSIS — Z8589 Personal history of malignant neoplasm of other organs and systems: Secondary | ICD-10-CM | POA: Insufficient documentation

## 2020-09-22 DIAGNOSIS — K529 Noninfective gastroenteritis and colitis, unspecified: Secondary | ICD-10-CM

## 2020-09-22 DIAGNOSIS — I10 Essential (primary) hypertension: Secondary | ICD-10-CM | POA: Diagnosis not present

## 2020-09-22 DIAGNOSIS — K5730 Diverticulosis of large intestine without perforation or abscess without bleeding: Secondary | ICD-10-CM | POA: Diagnosis not present

## 2020-09-22 DIAGNOSIS — K219 Gastro-esophageal reflux disease without esophagitis: Secondary | ICD-10-CM | POA: Diagnosis not present

## 2020-09-22 DIAGNOSIS — K648 Other hemorrhoids: Secondary | ICD-10-CM | POA: Diagnosis not present

## 2020-09-22 DIAGNOSIS — Z7951 Long term (current) use of inhaled steroids: Secondary | ICD-10-CM | POA: Insufficient documentation

## 2020-09-22 DIAGNOSIS — Z791 Long term (current) use of non-steroidal anti-inflammatories (NSAID): Secondary | ICD-10-CM | POA: Insufficient documentation

## 2020-09-22 DIAGNOSIS — Z79899 Other long term (current) drug therapy: Secondary | ICD-10-CM | POA: Diagnosis not present

## 2020-09-22 DIAGNOSIS — K5989 Other specified functional intestinal disorders: Secondary | ICD-10-CM | POA: Diagnosis not present

## 2020-09-22 DIAGNOSIS — Z1211 Encounter for screening for malignant neoplasm of colon: Secondary | ICD-10-CM | POA: Diagnosis not present

## 2020-09-22 DIAGNOSIS — R69 Illness, unspecified: Secondary | ICD-10-CM | POA: Diagnosis not present

## 2020-09-22 HISTORY — PX: COLONOSCOPY WITH PROPOFOL: SHX5780

## 2020-09-22 HISTORY — PX: BIOPSY: SHX5522

## 2020-09-22 LAB — GLUCOSE, CAPILLARY: Glucose-Capillary: 110 mg/dL — ABNORMAL HIGH (ref 70–99)

## 2020-09-22 SURGERY — COLONOSCOPY WITH PROPOFOL
Anesthesia: General

## 2020-09-22 MED ORDER — PROPOFOL 10 MG/ML IV BOLUS
INTRAVENOUS | Status: DC | PRN
  Administered 2020-09-22: 80 mg via INTRAVENOUS
  Administered 2020-09-22: 150 ug/kg/min via INTRAVENOUS

## 2020-09-22 MED ORDER — CHLORHEXIDINE GLUCONATE CLOTH 2 % EX PADS: 6.0000 | MEDICATED_PAD | Freq: Once | CUTANEOUS | Status: DC

## 2020-09-22 MED ORDER — STERILE WATER FOR IRRIGATION IR SOLN
Status: DC | PRN
  Administered 2020-09-22: 1.5 mL

## 2020-09-22 MED ORDER — LACTATED RINGERS IV SOLN: INTRAVENOUS | Status: DC | PRN

## 2020-09-22 NOTE — Anesthesia Preprocedure Evaluation (Signed)
Anesthesia Evaluation  Patient identified by MRN, date of birth, ID band Patient awake    Reviewed: Allergy & Precautions, H&P , NPO status , Patient's Chart, lab work & pertinent test results, reviewed documented beta blocker date and time   Airway Mallampati: II  TM Distance: >3 FB Neck ROM: full    Dental no notable dental hx.    Pulmonary neg pulmonary ROS, Current Smoker and Patient abstained from smoking.,    Pulmonary exam normal breath sounds clear to auscultation       Cardiovascular Exercise Tolerance: Good hypertension, negative cardio ROS   Rhythm:regular Rate:Normal     Neuro/Psych negative neurological ROS  negative psych ROS   GI/Hepatic negative GI ROS, Neg liver ROS, GERD  ,  Endo/Other  negative endocrine ROSdiabetes, Type 2Hypothyroidism   Renal/GU Renal diseasenegative Renal ROS  negative genitourinary   Musculoskeletal   Abdominal   Peds  Hematology negative hematology ROS (+) Blood dyscrasia, anemia ,   Anesthesia Other Findings   Reproductive/Obstetrics negative OB ROS                             Anesthesia Physical Anesthesia Plan  ASA: III  Anesthesia Plan: General   Post-op Pain Management:    Induction:   PONV Risk Score and Plan:   Airway Management Planned:   Additional Equipment:   Intra-op Plan:   Post-operative Plan:   Informed Consent: I have reviewed the patients History and Physical, chart, labs and discussed the procedure including the risks, benefits and alternatives for the proposed anesthesia with the patient or authorized representative who has indicated his/her understanding and acceptance.     Dental Advisory Given  Plan Discussed with: CRNA  Anesthesia Plan Comments:         Anesthesia Quick Evaluation

## 2020-09-22 NOTE — Interval H&P Note (Signed)
History and Physical Interval Note:  09/22/2020 8:53 AM  Melissa Sullivan  has presented today for surgery, with the diagnosis of screening.  The various methods of treatment have been discussed with the patient and family. After consideration of risks, benefits and other options for treatment, the patient has consented to  Procedure(s) with comments: COLONOSCOPY WITH PROPOFOL (N/A) - 2:15pm, per office, pt know new arrival time as a surgical intervention.  The patient's history has been reviewed, patient examined, no change in status, stable for surgery.  I have reviewed the patient's chart and labs.  Questions were answered to the patient's satisfaction.     Eloise Harman

## 2020-09-22 NOTE — Discharge Instructions (Addendum)
Colonoscopy Discharge Instructions  Read the instructions outlined below and refer to this sheet in the next few weeks. These discharge instructions provide you with general information on caring for yourself after you leave the hospital. Your doctor may also give you specific instructions. While your treatment has been planned according to the most current medical practices available, unavoidable complications occasionally occur.   ACTIVITY  You may resume your regular activity, but move at a slower pace for the next 24 hours.   Take frequent rest periods for the next 24 hours.   Walking will help get rid of the air and reduce the bloated feeling in your belly (abdomen).   No driving for 24 hours (because of the medicine (anesthesia) used during the test).    Do not sign any important legal documents or operate any machinery for 24 hours (because of the anesthesia used during the test).  NUTRITION  Drink plenty of fluids.   You may resume your normal diet as instructed by your doctor.   Begin with a light meal and progress to your normal diet. Heavy or fried foods are harder to digest and may make you feel sick to your stomach (nauseated).   Avoid alcoholic beverages for 24 hours or as instructed.  MEDICATIONS  You may resume your normal medications unless your doctor tells you otherwise.  WHAT YOU CAN EXPECT TODAY  Some feelings of bloating in the abdomen.   Passage of more gas than usual.   Spotting of blood in your stool or on the toilet paper.  IF YOU HAD POLYPS REMOVED DURING THE COLONOSCOPY:  No aspirin products for 7 days or as instructed.   No alcohol for 7 days or as instructed.   Eat a soft diet for the next 24 hours.  FINDING OUT THE RESULTS OF YOUR TEST Not all test results are available during your visit. If your test results are not back during the visit, make an appointment with your caregiver to find out the results. Do not assume everything is normal if  you have not heard from your caregiver or the medical facility. It is important for you to follow up on all of your test results.  SEEK IMMEDIATE MEDICAL ATTENTION IF:  You have more than a spotting of blood in your stool.   Your belly is swollen (abdominal distention).   You are nauseated or vomiting.   You have a temperature over 101.   You have abdominal pain or discomfort that is severe or gets worse throughout the day.   Your colonoscopy was relatively unremarkable.  I did not find any polyps or cancer.  You had a mild amount inflammation on the left side of your colon which is likely related to the colon preparation.  I did take biopsy of this we should have results back by the end of the week. Repeat colonoscopy in 10 years.  You also have diverticulosis and internal hemorrhoids. I would recommend increasing fiber in your diet or adding OTC Benefiber/Metamucil. Be sure to drink at least 4 to 6 glasses of water daily. Follow-up with GI as needed.   I hope you have a great rest of your week!  Elon Alas. Abbey Chatters, D.O. Gastroenterology and Hepatology [facility]   Hemorrhoids Hemorrhoids are swollen veins in and around the rectum or anus. There are two types of hemorrhoids:  Internal hemorrhoids. These occur in the veins that are just inside the rectum. They may poke through to the outside and become  irritated and painful.  External hemorrhoids. These occur in the veins that are outside the anus and can be felt as a painful swelling or hard lump near the anus. Most hemorrhoids do not cause serious problems, and they can be managed with home treatments such as diet and lifestyle changes. If home treatments do not help the symptoms, procedures can be done to shrink or remove the hemorrhoids. What are the causes? This condition is caused by increased pressure in the anal area. This pressure may result from various things,  including:  Constipation.  Straining to have a bowel movement.  Diarrhea.  Pregnancy.  Obesity.  Sitting for long periods of time.  Heavy lifting or other activity that causes you to strain.  Anal sex.  Riding a bike for a long period of time. What are the signs or symptoms? Symptoms of this condition include:  Pain.  Anal itching or irritation.  Rectal bleeding.  Leakage of stool (feces).  Anal swelling.  One or more lumps around the anus. How is this diagnosed? This condition can often be diagnosed through a visual exam. Other exams or tests may also be done, such as:  An exam that involves feeling the rectal area with a gloved hand (digital rectal exam).  An exam of the anal canal that is done using a small tube (anoscope).  A blood test, if you have lost a significant amount of blood.  A test to look inside the colon using a flexible tube with a camera on the end (sigmoidoscopy or colonoscopy). How is this treated? This condition can usually be treated at home. However, various procedures may be done if dietary changes, lifestyle changes, and other home treatments do not help your symptoms. These procedures can help make the hemorrhoids smaller or remove them completely. Some of these procedures involve surgery, and others do not. Common procedures include:  Rubber band ligation. Rubber bands are placed at the base of the hemorrhoids to cut off their blood supply.  Sclerotherapy. Medicine is injected into the hemorrhoids to shrink them.  Infrared coagulation. A type of light energy is used to get rid of the hemorrhoids.  Hemorrhoidectomy surgery. The hemorrhoids are surgically removed, and the veins that supply them are tied off.  Stapled hemorrhoidopexy surgery. The surgeon staples the base of the hemorrhoid to the rectal wall. Follow these instructions at home: Eating and drinking   Eat foods that have a lot of fiber in them, such as whole grains,  beans, nuts, fruits, and vegetables.  Ask your health care provider about taking products that have added fiber (fiber supplements).  Reduce the amount of fat in your diet. You can do this by eating low-fat dairy products, eating less red meat, and avoiding processed foods.  Drink enough fluid to keep your urine pale yellow. Managing pain and swelling   Take warm sitz baths for 20 minutes, 3-4 times a day to ease pain and discomfort. You may do this in a bathtub or using a portable sitz bath that fits over the toilet.  If directed, apply ice to the affected area. Using ice packs between sitz baths may be helpful. ? Put ice in a plastic bag. ? Place a towel between your skin and the bag. ? Leave the ice on for 20 minutes, 2-3 times a day. General instructions  Take over-the-counter and prescription medicines only as told by your health care provider.  Use medicated creams or suppositories as told.  Get regular exercise. Ask your  health care provider how much and what kind of exercise is best for you. In general, you should do moderate exercise for at least 30 minutes on most days of the week (150 minutes each week). This can include activities such as walking, biking, or yoga.  Go to the bathroom when you have the urge to have a bowel movement. Do not wait.  Avoid straining to have bowel movements.  Keep the anal area dry and clean. Use wet toilet paper or moist towelettes after a bowel movement.  Do not sit on the toilet for long periods of time. This increases blood pooling and pain.  Keep all follow-up visits as told by your health care provider. This is important. Contact a health care provider if you have:  Increasing pain and swelling that are not controlled by treatment or medicine.  Difficulty having a bowel movement, or you are unable to have a bowel movement.  Pain or inflammation outside the area of the hemorrhoids. Get help right away if you have:  Uncontrolled  bleeding from your rectum. Summary  Hemorrhoids are swollen veins in and around the rectum or anus.  Most hemorrhoids can be managed with home treatments such as diet and lifestyle changes.  Taking warm sitz baths can help ease pain and discomfort.  In severe cases, procedures or surgery can be done to shrink or remove the hemorrhoids. This information is not intended to replace advice given to you by your health care provider. Make sure you discuss any questions you have with your health care provider. Document Revised: 04/05/2019 Document Reviewed: 03/29/2018 Elsevier Patient Education  [place].  Diverticulosis  Diverticulosis is a condition that develops when small pouches (diverticula) form in the wall of the large intestine (colon). The colon is where water is absorbed and stool (feces) is formed. The pouches form when the inside layer of the colon pushes through weak spots in the outer layers of the colon. You may have a few pouches or many of them. The pouches usually do not cause problems unless they become inflamed or infected. When this happens, the condition is called diverticulitis. What are the causes? The cause of this condition is not known. What increases the risk? The following factors may make you more likely to develop this condition:  Being older than age 26. Your risk for this condition increases with age. Diverticulosis is rare among people younger than age 57. By age 35, many people have it.  Eating a low-fiber diet.  Having frequent constipation.  Being overweight.  Not getting enough exercise.  Smoking.  Taking over-the-counter pain medicines, like aspirin and ibuprofen.  Having a family history of diverticulosis. What are the signs or symptoms? In most people, there are no symptoms of this condition. If you do have symptoms, they may include:  Bloating.  Cramps in the abdomen.  Constipation or diarrhea.  Pain in the lower left side  of the abdomen. How is this diagnosed? Because diverticulosis usually has no symptoms, it is most often diagnosed during an exam for other colon problems. The condition may be diagnosed by:  Using a flexible scope to examine the colon (colonoscopy).  Taking an X-ray of the colon after dye has been put into the colon (barium enema).  Having a CT scan. How is this treated? You may not need treatment for this condition. Your health care provider may recommend treatment to prevent problems. You may need treatment if you have symptoms or if you previously had  diverticulitis. Treatment may include:  Eating a high-fiber diet.  Taking a fiber supplement.  Taking a live bacteria supplement (probiotic).  Taking medicine to relax your colon. Follow these instructions at home: Medicines  Take over-the-counter and prescription medicines only as told by your health care provider.  If told by your health care provider, take a fiber supplement or probiotic. Constipation prevention Your condition may cause constipation. To prevent or treat constipation, you may need to:  Drink enough fluid to keep your urine pale yellow.  Take over-the-counter or prescription medicines.  Eat foods that are high in fiber, such as beans, whole grains, and fresh fruits and vegetables.  Limit foods that are high in fat and processed sugars, such as fried or sweet foods.  General instructions  Try not to strain when you have a bowel movement.  Keep all follow-up visits as told by your health care provider. This is important. Contact a health care provider if you:  Have pain in your abdomen.  Have bloating.  Have cramps.  Have not had a bowel movement in 3 days. Get help right away if:  Your pain gets worse.  Your bloating becomes very bad.  You have a fever or chills, and your symptoms suddenly get worse.  You vomit.  You have bowel movements that are bloody or black.  You have bleeding from  your rectum. Summary  Diverticulosis is a condition that develops when small pouches (diverticula) form in the wall of the large intestine (colon).  You may have a few pouches or many of them.  This condition is most often diagnosed during an exam for other colon problems.  Treatment may include increasing the fiber in your diet, taking supplements, or taking medicines. This information is not intended to replace advice given to you by your health care provider. Make sure you discuss any questions you have with your health care provider. Document Revised: 06/06/2019 Document Reviewed: 06/06/2019 Elsevier Patient Education  [place].

## 2020-09-22 NOTE — Anesthesia Postprocedure Evaluation (Signed)
Anesthesia Post Note  Patient: Melissa Sullivan  Procedure(s) Performed: COLONOSCOPY WITH PROPOFOL (N/A ) BIOPSY  Patient location during evaluation: PACU Anesthesia Type: General Level of consciousness: awake, oriented, awake and alert and patient cooperative Pain management: pain level controlled Vital Signs Assessment: post-procedure vital signs reviewed and stable Respiratory status: spontaneous breathing, respiratory function stable and nonlabored ventilation Cardiovascular status: blood pressure returned to baseline and stable Postop Assessment: no headache and no backache Anesthetic complications: no   No complications documented.   Last Vitals:  Vitals:   09/22/20 0853  BP: 134/87  Pulse: (!) 114  Resp: (!) 22  Temp: 37.1 C  SpO2: 98%    Last Pain:  Vitals:   09/22/20 0853  TempSrc: Oral                 Tacy Learn

## 2020-09-22 NOTE — Op Note (Signed)
[facility] Patient Name: Melissa Sullivan Procedure Date: 09/22/2020 9:12 AM MRN: [id number] Date of Birth: 08-24-1956 Attending MD: Elon Alas. Edgar Frisk CSN: [id number] Age: 64 Admit Type: Outpatient Procedure:                Colonoscopy Indications:              Screening for colorectal malignant neoplasm Providers:                Elon Alas. Abbey Chatters, DO, Tacy Learn,                            Technician Referring MD:              Medicines:                See the Anesthesia note for documentation of the                            administered medications Complications:            No immediate complications. Estimated Blood Loss:     Estimated blood loss was minimal. Procedure:                Pre-Anesthesia Assessment:                           - The anesthesia plan was to use monitored                            anesthesia care (MAC).                           After obtaining informed consent, the colonoscope                            was passed under direct vision. Throughout the                            procedure, the patient's blood pressure, pulse, and                            oxygen saturations were monitored continuously. The                            [id number]-[id number] ([id number]) scope was introduced through                            the anus and advanced to the the cecum, identified                            by appendiceal orifice and ileocecal valve. The                            colonoscopy was performed without difficulty. The                            patient tolerated the procedure well. The quality  of the bowel preparation was evaluated using the                            BBPS [facility] Bowel Preparation Scale) with scores                            of: Right Colon = 3, Transverse Colon = 3 and Left                            Colon = 3 (entire mucosa seen well with no residual                            staining, small  fragments of stool or opaque                            liquid). The total BBPS score equals 9. Scope In: 9:20:31 AM Scope Out: 9:33:35 AM Scope Withdrawal Time: 0 hours 8 minutes 26 seconds  Total Procedure Duration: 0 hours 13 minutes 4 seconds  Findings:      The perianal and digital rectal examinations were normal.      Non-bleeding internal hemorrhoids were found during endoscopy.      Multiple small-mouthed diverticula were found in the sigmoid colon and       descending colon.      Localized mild inflammation characterized by erythema was found in the       sigmoid colon. Query prep articfact. Biopsies were taken with a cold       forceps for histology. Impression:               - Non-bleeding internal hemorrhoids.                           - Diverticulosis in the sigmoid colon and in the                            descending colon.                           - Localized mild inflammation was found in the                            sigmoid colon. Biopsied. Moderate Sedation:      Per Anesthesia Care Recommendation:           - Patient has a contact number available for                            emergencies. The signs and symptoms of potential                            delayed complications were discussed with the                            patient. Return to normal activities tomorrow.  Written discharge instructions were provided to the                            patient.                           - Resume previous diet.                           - Continue present medications.                           - Repeat colonoscopy in 10 years for screening                            purposes.                           - Return to GI clinic PRN. Procedure Code(s):        --- Professional ---                           [phone], Colonoscopy, flexible; with biopsy, single                            or multiple Diagnosis Code(s):        --- Professional ---                            Z12.11, Encounter for screening for malignant                            neoplasm of colon                           K64.8, Other hemorrhoids                           K52.9, Noninfective gastroenteritis and colitis,                            unspecified                           K57.30, Diverticulosis of large intestine without                            perforation or abscess without bleeding CPT copyright 2019 American Medical Association. All rights reserved. The codes documented in this report are preliminary and upon coder review may  be revised to meet current compliance requirements. Elon Alas. Abbey Chatters, DO [place] Abbey Chatters, DO 09/22/2020 9:37:12 AM This report has been signed electronically. Number of Addenda: 0

## 2020-09-22 NOTE — Transfer of Care (Signed)
Immediate Anesthesia Transfer of Care Note  Patient: Melissa Sullivan  Procedure(s) Performed: COLONOSCOPY WITH PROPOFOL (N/A ) BIOPSY  Patient Location: PACU  Anesthesia Type:General  Level of Consciousness: awake, alert , oriented and patient cooperative  Airway & Oxygen Therapy: Patient Spontanous Breathing  Post-op Assessment: Report given to RN, Post -op Vital signs reviewed and stable and Patient moving all extremities  Post vital signs: Reviewed and stable  Last Vitals:  Vitals Value Taken Time  BP    Temp    Pulse    Resp    SpO2      Last Pain:  Vitals:   09/22/20 0853  TempSrc: Oral         Complications: No complications documented.

## 2020-09-23 LAB — SURGICAL PATHOLOGY

## 2020-09-28 ENCOUNTER — Encounter (HOSPITAL_COMMUNITY): Payer: Self-pay | Admitting: Internal Medicine

## 2020-09-28 DIAGNOSIS — J45998 Other asthma: Secondary | ICD-10-CM | POA: Diagnosis not present

## 2020-09-28 DIAGNOSIS — Z23 Encounter for immunization: Secondary | ICD-10-CM | POA: Diagnosis not present

## 2020-09-28 DIAGNOSIS — R7301 Impaired fasting glucose: Secondary | ICD-10-CM | POA: Diagnosis not present

## 2020-09-28 DIAGNOSIS — E440 Moderate protein-calorie malnutrition: Secondary | ICD-10-CM | POA: Diagnosis not present

## 2020-09-28 DIAGNOSIS — D649 Anemia, unspecified: Secondary | ICD-10-CM | POA: Diagnosis not present

## 2020-09-28 DIAGNOSIS — E039 Hypothyroidism, unspecified: Secondary | ICD-10-CM | POA: Diagnosis not present

## 2020-09-28 DIAGNOSIS — Z712 Person consulting for explanation of examination or test findings: Secondary | ICD-10-CM | POA: Diagnosis not present

## 2020-09-28 DIAGNOSIS — R634 Abnormal weight loss: Secondary | ICD-10-CM | POA: Diagnosis not present

## 2020-09-28 DIAGNOSIS — J4520 Mild intermittent asthma, uncomplicated: Secondary | ICD-10-CM | POA: Diagnosis not present

## 2020-09-28 DIAGNOSIS — L309 Dermatitis, unspecified: Secondary | ICD-10-CM | POA: Diagnosis not present

## 2020-09-28 DIAGNOSIS — C799 Secondary malignant neoplasm of unspecified site: Secondary | ICD-10-CM | POA: Diagnosis not present

## 2020-09-28 DIAGNOSIS — R946 Abnormal results of thyroid function studies: Secondary | ICD-10-CM | POA: Diagnosis not present

## 2020-09-28 DIAGNOSIS — E782 Mixed hyperlipidemia: Secondary | ICD-10-CM | POA: Diagnosis not present

## 2020-10-08 ENCOUNTER — Inpatient Hospital Stay (HOSPITAL_COMMUNITY): Payer: Medicare HMO | Attending: Hematology

## 2020-10-08 ENCOUNTER — Other Ambulatory Visit: Payer: Self-pay

## 2020-10-08 ENCOUNTER — Inpatient Hospital Stay (HOSPITAL_COMMUNITY): Payer: Medicare HMO

## 2020-10-08 VITALS — BP 104/60 | HR 80 | Temp 96.8°F | Resp 17 | Wt 103.3 lb

## 2020-10-08 DIAGNOSIS — C7951 Secondary malignant neoplasm of bone: Secondary | ICD-10-CM | POA: Diagnosis not present

## 2020-10-08 DIAGNOSIS — Z923 Personal history of irradiation: Secondary | ICD-10-CM | POA: Diagnosis not present

## 2020-10-08 DIAGNOSIS — C7971 Secondary malignant neoplasm of right adrenal gland: Secondary | ICD-10-CM | POA: Insufficient documentation

## 2020-10-08 DIAGNOSIS — R69 Illness, unspecified: Secondary | ICD-10-CM | POA: Diagnosis not present

## 2020-10-08 DIAGNOSIS — F17210 Nicotine dependence, cigarettes, uncomplicated: Secondary | ICD-10-CM | POA: Diagnosis not present

## 2020-10-08 DIAGNOSIS — C3411 Malignant neoplasm of upper lobe, right bronchus or lung: Secondary | ICD-10-CM | POA: Insufficient documentation

## 2020-10-08 DIAGNOSIS — Z79899 Other long term (current) drug therapy: Secondary | ICD-10-CM | POA: Insufficient documentation

## 2020-10-08 DIAGNOSIS — Z5112 Encounter for antineoplastic immunotherapy: Secondary | ICD-10-CM | POA: Insufficient documentation

## 2020-10-08 DIAGNOSIS — E278 Other specified disorders of adrenal gland: Secondary | ICD-10-CM

## 2020-10-08 DIAGNOSIS — Z9221 Personal history of antineoplastic chemotherapy: Secondary | ICD-10-CM | POA: Diagnosis not present

## 2020-10-08 LAB — CBC WITH DIFFERENTIAL/PLATELET
Abs Immature Granulocytes: 0.04 10*3/uL (ref 0.00–0.07)
Basophils Absolute: 0 10*3/uL (ref 0.0–0.1)
Basophils Relative: 1 %
Eosinophils Absolute: 0.2 10*3/uL (ref 0.0–0.5)
Eosinophils Relative: 3 %
HCT: 43.8 % (ref 36.0–46.0)
Hemoglobin: 13.9 g/dL (ref 12.0–15.0)
Immature Granulocytes: 1 %
Lymphocytes Relative: 29 %
Lymphs Abs: 2.4 10*3/uL (ref 0.7–4.0)
MCH: 30.2 pg (ref 26.0–34.0)
MCHC: 31.7 g/dL (ref 30.0–36.0)
MCV: 95 fL (ref 80.0–100.0)
Monocytes Absolute: 0.6 10*3/uL (ref 0.1–1.0)
Monocytes Relative: 7 %
Neutro Abs: 5 10*3/uL (ref 1.7–7.7)
Neutrophils Relative %: 59 %
Platelets: 314 10*3/uL (ref 150–400)
RBC: 4.61 MIL/uL (ref 3.87–5.11)
RDW: 15.5 % (ref 11.5–15.5)
WBC: 8.4 10*3/uL (ref 4.0–10.5)
nRBC: 0 % (ref 0.0–0.2)

## 2020-10-08 LAB — COMPREHENSIVE METABOLIC PANEL
ALT: 13 U/L (ref 0–44)
AST: 17 U/L (ref 15–41)
Albumin: 4.2 g/dL (ref 3.5–5.0)
Alkaline Phosphatase: 76 U/L (ref 38–126)
Anion gap: 7 (ref 5–15)
BUN: 11 mg/dL (ref 8–23)
CO2: 26 mmol/L (ref 22–32)
Calcium: 9.9 mg/dL (ref 8.9–10.3)
Chloride: 104 mmol/L (ref 98–111)
Creatinine, Ser: 1.17 mg/dL — ABNORMAL HIGH (ref 0.44–1.00)
GFR, Estimated: 52 mL/min — ABNORMAL LOW (ref >60)
Glucose, Bld: 86 mg/dL (ref 70–99)
Potassium: 4 mmol/L (ref 3.5–5.1)
Sodium: 137 mmol/L (ref 135–145)
Total Bilirubin: 0.5 mg/dL (ref 0.3–1.2)
Total Protein: 8.1 g/dL (ref 6.5–8.1)

## 2020-10-08 LAB — TSH: TSH: 1.99 u[IU]/mL (ref 0.350–4.500)

## 2020-10-08 MED ORDER — SODIUM CHLORIDE 0.9 % IV SOLN
480.0000 mg | Freq: Once | INTRAVENOUS | Status: AC
  Administered 2020-10-08: 480 mg via INTRAVENOUS
  Filled 2020-10-08: qty 48

## 2020-10-08 MED ORDER — SODIUM CHLORIDE 0.9 % IV SOLN: Freq: Once | INTRAVENOUS | Status: AC

## 2020-10-08 MED ORDER — SODIUM CHLORIDE 0.9% FLUSH
10.0000 mL | INTRAVENOUS | Status: DC | PRN
  Administered 2020-10-08: 10 mL

## 2020-10-08 MED ORDER — HEPARIN SOD (PORK) LOCK FLUSH 100 UNIT/ML IV SOLN
500.0000 [IU] | Freq: Once | INTRAVENOUS | Status: AC | PRN
  Administered 2020-10-08: 500 [IU]

## 2020-10-08 NOTE — Progress Notes (Signed)
Infusions tolerated without incident or complaint. VSS upon completion of treatment. Port flushed and deaccessed per protocol, see MAR and IV flowsheet for details. Discharged in satisfactory condition with follow up instructions.

## 2020-10-08 NOTE — Patient Instructions (Signed)
[facility] Discharge Instructions for Patients Receiving Chemotherapy   Beginning January 23rd 2017 lab work for the [facility] will be done in the  Main lab at [facility] on 1st floor. If you have a lab appointment with the [facility] please come in thru the  Main Entrance and check in at the main information desk   Today you received the following chemotherapy agents D1C38  To help prevent nausea and vomiting after your treatment, we encourage you to take your nausea medication   If you develop nausea and vomiting, or diarrhea that is not controlled by your medication, call the clinic.  The clinic phone number is [phone]. Office hours are Monday-Friday 8:30am-5:00pm.  BELOW ARE SYMPTOMS THAT SHOULD BE REPORTED IMMEDIATELY:  *FEVER GREATER THAN 101.0 F  *CHILLS WITH OR WITHOUT FEVER  NAUSEA AND VOMITING THAT IS NOT CONTROLLED WITH YOUR NAUSEA MEDICATION  *UNUSUAL SHORTNESS OF BREATH  *UNUSUAL BRUISING OR BLEEDING  TENDERNESS IN MOUTH AND THROAT WITH OR WITHOUT PRESENCE OF ULCERS  *URINARY PROBLEMS  *BOWEL PROBLEMS  UNUSUAL RASH Items with * indicate a potential emergency and should be followed up as soon as possible. If you have an emergency after office hours please contact your primary care physician or go to the nearest emergency department.  Please call the clinic during office hours if you have any questions or concerns.   You may also contact the Patient Navigator at [phone] should you have any questions or need assistance in obtaining follow up care.      Resources For Cancer Patients and their Caregivers ? American Cancer Society: Can assist with transportation, wigs, general needs, runs Look Good Feel Better.        574-087-2407 ? Cancer Care: Provides financial assistance, online support groups, medication/co-pay assistance.  1-800-813-HOPE 563-183-1216) ? [place] Assists [place] cancer  patients and their families through emotional , educational and financial support.  617-199-2372 ? [place] DSS Where to apply for food stamps, Medicaid and utility assistance. 424-464-1184 ? [employer]: Transportation to medical appointments. (904) 060-0502 ? Social Security Administration: May apply for disability if have a Stage IV cancer. 713-388-3681 618-021-5129 ? [employer], Disability and Transit Services: Assists with nutrition, care and transit needs. 2083650004

## 2020-10-14 DIAGNOSIS — R634 Abnormal weight loss: Secondary | ICD-10-CM | POA: Diagnosis not present

## 2020-10-14 DIAGNOSIS — R946 Abnormal results of thyroid function studies: Secondary | ICD-10-CM | POA: Diagnosis not present

## 2020-10-14 DIAGNOSIS — N1831 Chronic kidney disease, stage 3a: Secondary | ICD-10-CM | POA: Diagnosis not present

## 2020-10-14 DIAGNOSIS — M87180 Osteonecrosis due to drugs, jaw: Secondary | ICD-10-CM | POA: Diagnosis not present

## 2020-10-14 DIAGNOSIS — E440 Moderate protein-calorie malnutrition: Secondary | ICD-10-CM | POA: Diagnosis not present

## 2020-10-14 DIAGNOSIS — M546 Pain in thoracic spine: Secondary | ICD-10-CM | POA: Diagnosis not present

## 2020-10-14 DIAGNOSIS — Z0001 Encounter for general adult medical examination with abnormal findings: Secondary | ICD-10-CM | POA: Diagnosis not present

## 2020-10-14 DIAGNOSIS — I700 Atherosclerosis of aorta: Secondary | ICD-10-CM | POA: Diagnosis not present

## 2020-10-14 DIAGNOSIS — C799 Secondary malignant neoplasm of unspecified site: Secondary | ICD-10-CM | POA: Diagnosis not present

## 2020-10-14 DIAGNOSIS — C3490 Malignant neoplasm of unspecified part of unspecified bronchus or lung: Secondary | ICD-10-CM | POA: Diagnosis not present

## 2020-10-30 DIAGNOSIS — B351 Tinea unguium: Secondary | ICD-10-CM | POA: Diagnosis not present

## 2020-10-30 DIAGNOSIS — G629 Polyneuropathy, unspecified: Secondary | ICD-10-CM | POA: Diagnosis not present

## 2020-11-05 ENCOUNTER — Other Ambulatory Visit (HOSPITAL_COMMUNITY): Payer: Self-pay | Admitting: *Deleted

## 2020-11-05 ENCOUNTER — Other Ambulatory Visit (HOSPITAL_COMMUNITY): Payer: Self-pay | Admitting: Hematology and Oncology

## 2020-11-05 ENCOUNTER — Other Ambulatory Visit: Payer: Self-pay

## 2020-11-05 ENCOUNTER — Inpatient Hospital Stay (HOSPITAL_COMMUNITY): Payer: Medicare HMO

## 2020-11-05 ENCOUNTER — Encounter (HOSPITAL_COMMUNITY): Payer: Self-pay

## 2020-11-05 ENCOUNTER — Inpatient Hospital Stay (HOSPITAL_COMMUNITY): Payer: Medicare HMO | Attending: Hematology and Oncology

## 2020-11-05 VITALS — BP 122/67 | HR 72 | Temp 97.0°F | Resp 16 | Wt 105.4 lb

## 2020-11-05 DIAGNOSIS — Z79899 Other long term (current) drug therapy: Secondary | ICD-10-CM | POA: Diagnosis not present

## 2020-11-05 DIAGNOSIS — R69 Illness, unspecified: Secondary | ICD-10-CM | POA: Diagnosis not present

## 2020-11-05 DIAGNOSIS — F17210 Nicotine dependence, cigarettes, uncomplicated: Secondary | ICD-10-CM | POA: Diagnosis not present

## 2020-11-05 DIAGNOSIS — C7971 Secondary malignant neoplasm of right adrenal gland: Secondary | ICD-10-CM | POA: Insufficient documentation

## 2020-11-05 DIAGNOSIS — Z9221 Personal history of antineoplastic chemotherapy: Secondary | ICD-10-CM | POA: Diagnosis not present

## 2020-11-05 DIAGNOSIS — C7951 Secondary malignant neoplasm of bone: Secondary | ICD-10-CM | POA: Diagnosis not present

## 2020-11-05 DIAGNOSIS — Z923 Personal history of irradiation: Secondary | ICD-10-CM | POA: Diagnosis not present

## 2020-11-05 DIAGNOSIS — C3411 Malignant neoplasm of upper lobe, right bronchus or lung: Secondary | ICD-10-CM | POA: Insufficient documentation

## 2020-11-05 DIAGNOSIS — Z5112 Encounter for antineoplastic immunotherapy: Secondary | ICD-10-CM | POA: Diagnosis not present

## 2020-11-05 DIAGNOSIS — E278 Other specified disorders of adrenal gland: Secondary | ICD-10-CM

## 2020-11-05 DIAGNOSIS — R630 Anorexia: Secondary | ICD-10-CM

## 2020-11-05 LAB — COMPREHENSIVE METABOLIC PANEL
ALT: 35 U/L (ref 0–44)
AST: 31 U/L (ref 15–41)
Albumin: 4.1 g/dL (ref 3.5–5.0)
Alkaline Phosphatase: 62 U/L (ref 38–126)
Anion gap: 7 (ref 5–15)
BUN: 16 mg/dL (ref 8–23)
CO2: 26 mmol/L (ref 22–32)
Calcium: 10 mg/dL (ref 8.9–10.3)
Chloride: 101 mmol/L (ref 98–111)
Creatinine, Ser: 1.21 mg/dL — ABNORMAL HIGH (ref 0.44–1.00)
GFR, Estimated: 50 mL/min — ABNORMAL LOW (ref >60)
Glucose, Bld: 95 mg/dL (ref 70–99)
Potassium: 4.8 mmol/L (ref 3.5–5.1)
Sodium: 134 mmol/L — ABNORMAL LOW (ref 135–145)
Total Bilirubin: 0.6 mg/dL (ref 0.3–1.2)
Total Protein: 7.8 g/dL (ref 6.5–8.1)

## 2020-11-05 LAB — CBC WITH DIFFERENTIAL/PLATELET
Abs Immature Granulocytes: 0.05 10*3/uL (ref 0.00–0.07)
Basophils Absolute: 0 10*3/uL (ref 0.0–0.1)
Basophils Relative: 0 %
Eosinophils Absolute: 0.2 10*3/uL (ref 0.0–0.5)
Eosinophils Relative: 2 %
HCT: 42 % (ref 36.0–46.0)
Hemoglobin: 13.5 g/dL (ref 12.0–15.0)
Immature Granulocytes: 1 %
Lymphocytes Relative: 30 %
Lymphs Abs: 2.8 10*3/uL (ref 0.7–4.0)
MCH: 30.8 pg (ref 26.0–34.0)
MCHC: 32.1 g/dL (ref 30.0–36.0)
MCV: 95.7 fL (ref 80.0–100.0)
Monocytes Absolute: 0.8 10*3/uL (ref 0.1–1.0)
Monocytes Relative: 8 %
Neutro Abs: 5.5 10*3/uL (ref 1.7–7.7)
Neutrophils Relative %: 59 %
Platelets: 277 10*3/uL (ref 150–400)
RBC: 4.39 MIL/uL (ref 3.87–5.11)
RDW: 15.7 % — ABNORMAL HIGH (ref 11.5–15.5)
WBC: 9.3 10*3/uL (ref 4.0–10.5)
nRBC: 0 % (ref 0.0–0.2)

## 2020-11-05 LAB — TSH: TSH: 3.008 u[IU]/mL (ref 0.350–4.500)

## 2020-11-05 MED ORDER — SODIUM CHLORIDE 0.9 % IV SOLN
480.0000 mg | Freq: Once | INTRAVENOUS | Status: AC
  Administered 2020-11-05: 480 mg via INTRAVENOUS
  Filled 2020-11-05: qty 48

## 2020-11-05 MED ORDER — DRONABINOL 2.5 MG PO CAPS: 2.5000 mg | ORAL_CAPSULE | Freq: Two times a day (BID) | ORAL | 2 refills | Status: DC

## 2020-11-05 MED ORDER — HEPARIN SOD (PORK) LOCK FLUSH 100 UNIT/ML IV SOLN
500.0000 [IU] | Freq: Once | INTRAVENOUS | Status: AC | PRN
  Administered 2020-11-05: 500 [IU]

## 2020-11-05 MED ORDER — SODIUM CHLORIDE 0.9 % IV SOLN: Freq: Once | INTRAVENOUS | Status: AC

## 2020-11-05 MED ORDER — OXYCODONE HCL 10 MG PO TABS: 10.0000 mg | ORAL_TABLET | Freq: Two times a day (BID) | ORAL | 0 refills | Status: DC | PRN

## 2020-11-05 NOTE — Patient Instructions (Signed)
You received your opdivo infusion today.  Follow up as scheduled.

## 2020-11-05 NOTE — Progress Notes (Signed)
Patient tolerated her opdivo infusion today without incidence.  She was discharged ambulatory and in stable condition from clinic.  She will follow up as scheduled.

## 2020-11-09 ENCOUNTER — Other Ambulatory Visit (HOSPITAL_COMMUNITY): Payer: Self-pay

## 2020-11-09 DIAGNOSIS — E032 Hypothyroidism due to medicaments and other exogenous substances: Secondary | ICD-10-CM

## 2020-11-09 MED ORDER — LEVOTHYROXINE SODIUM 25 MCG PO TABS: ORAL_TABLET | ORAL | 0 refills | Status: DC

## 2020-11-20 DIAGNOSIS — D538 Other specified nutritional anemias: Secondary | ICD-10-CM | POA: Diagnosis not present

## 2020-11-20 DIAGNOSIS — M87180 Osteonecrosis due to drugs, jaw: Secondary | ICD-10-CM | POA: Diagnosis not present

## 2020-11-20 DIAGNOSIS — C3490 Malignant neoplasm of unspecified part of unspecified bronchus or lung: Secondary | ICD-10-CM | POA: Diagnosis not present

## 2020-11-20 DIAGNOSIS — E440 Moderate protein-calorie malnutrition: Secondary | ICD-10-CM | POA: Diagnosis not present

## 2020-11-20 DIAGNOSIS — R634 Abnormal weight loss: Secondary | ICD-10-CM | POA: Diagnosis not present

## 2020-11-20 DIAGNOSIS — N1831 Chronic kidney disease, stage 3a: Secondary | ICD-10-CM | POA: Diagnosis not present

## 2020-11-20 DIAGNOSIS — C799 Secondary malignant neoplasm of unspecified site: Secondary | ICD-10-CM | POA: Diagnosis not present

## 2020-11-24 DIAGNOSIS — R69 Illness, unspecified: Secondary | ICD-10-CM | POA: Diagnosis not present

## 2020-11-24 DIAGNOSIS — C7970 Secondary malignant neoplasm of unspecified adrenal gland: Secondary | ICD-10-CM | POA: Diagnosis not present

## 2020-11-24 DIAGNOSIS — E46 Unspecified protein-calorie malnutrition: Secondary | ICD-10-CM | POA: Diagnosis not present

## 2020-11-24 DIAGNOSIS — E785 Hyperlipidemia, unspecified: Secondary | ICD-10-CM | POA: Diagnosis not present

## 2020-11-24 DIAGNOSIS — D649 Anemia, unspecified: Secondary | ICD-10-CM | POA: Diagnosis not present

## 2020-11-24 DIAGNOSIS — C3490 Malignant neoplasm of unspecified part of unspecified bronchus or lung: Secondary | ICD-10-CM | POA: Diagnosis not present

## 2020-11-24 DIAGNOSIS — E039 Hypothyroidism, unspecified: Secondary | ICD-10-CM | POA: Diagnosis not present

## 2020-11-24 DIAGNOSIS — J439 Emphysema, unspecified: Secondary | ICD-10-CM | POA: Diagnosis not present

## 2020-11-24 DIAGNOSIS — I739 Peripheral vascular disease, unspecified: Secondary | ICD-10-CM | POA: Diagnosis not present

## 2020-11-24 DIAGNOSIS — C7951 Secondary malignant neoplasm of bone: Secondary | ICD-10-CM | POA: Diagnosis not present

## 2020-11-27 DIAGNOSIS — H524 Presbyopia: Secondary | ICD-10-CM | POA: Diagnosis not present

## 2020-11-27 DIAGNOSIS — H2513 Age-related nuclear cataract, bilateral: Secondary | ICD-10-CM | POA: Diagnosis not present

## 2020-11-27 DIAGNOSIS — H5213 Myopia, bilateral: Secondary | ICD-10-CM | POA: Diagnosis not present

## 2020-12-02 ENCOUNTER — Other Ambulatory Visit: Payer: Self-pay

## 2020-12-02 ENCOUNTER — Inpatient Hospital Stay (HOSPITAL_COMMUNITY): Payer: Medicare HMO | Attending: Hematology

## 2020-12-02 DIAGNOSIS — F17210 Nicotine dependence, cigarettes, uncomplicated: Secondary | ICD-10-CM | POA: Diagnosis not present

## 2020-12-02 DIAGNOSIS — Z5112 Encounter for antineoplastic immunotherapy: Secondary | ICD-10-CM | POA: Diagnosis not present

## 2020-12-02 DIAGNOSIS — Z9221 Personal history of antineoplastic chemotherapy: Secondary | ICD-10-CM | POA: Insufficient documentation

## 2020-12-02 DIAGNOSIS — R69 Illness, unspecified: Secondary | ICD-10-CM | POA: Diagnosis not present

## 2020-12-02 DIAGNOSIS — C7951 Secondary malignant neoplasm of bone: Secondary | ICD-10-CM | POA: Diagnosis not present

## 2020-12-02 DIAGNOSIS — C3411 Malignant neoplasm of upper lobe, right bronchus or lung: Secondary | ICD-10-CM

## 2020-12-02 DIAGNOSIS — C7971 Secondary malignant neoplasm of right adrenal gland: Secondary | ICD-10-CM | POA: Insufficient documentation

## 2020-12-02 DIAGNOSIS — Z79899 Other long term (current) drug therapy: Secondary | ICD-10-CM | POA: Diagnosis not present

## 2020-12-02 DIAGNOSIS — E278 Other specified disorders of adrenal gland: Secondary | ICD-10-CM

## 2020-12-02 DIAGNOSIS — Z923 Personal history of irradiation: Secondary | ICD-10-CM | POA: Diagnosis not present

## 2020-12-02 LAB — CBC WITH DIFFERENTIAL/PLATELET
Abs Immature Granulocytes: 0.02 10*3/uL (ref 0.00–0.07)
Basophils Absolute: 0 10*3/uL (ref 0.0–0.1)
Basophils Relative: 0 %
Eosinophils Absolute: 0.2 10*3/uL (ref 0.0–0.5)
Eosinophils Relative: 2 %
HCT: 43.7 % (ref 36.0–46.0)
Hemoglobin: 14.1 g/dL (ref 12.0–15.0)
Immature Granulocytes: 0 %
Lymphocytes Relative: 34 %
Lymphs Abs: 2.7 10*3/uL (ref 0.7–4.0)
MCH: 30.7 pg (ref 26.0–34.0)
MCHC: 32.3 g/dL (ref 30.0–36.0)
MCV: 95.2 fL (ref 80.0–100.0)
Monocytes Absolute: 0.7 10*3/uL (ref 0.1–1.0)
Monocytes Relative: 8 %
Neutro Abs: 4.4 10*3/uL (ref 1.7–7.7)
Neutrophils Relative %: 56 %
Platelets: 271 10*3/uL (ref 150–400)
RBC: 4.59 MIL/uL (ref 3.87–5.11)
RDW: 14.8 % (ref 11.5–15.5)
WBC: 8 10*3/uL (ref 4.0–10.5)
nRBC: 0 % (ref 0.0–0.2)

## 2020-12-02 LAB — TSH: TSH: 2.401 u[IU]/mL (ref 0.350–4.500)

## 2020-12-02 LAB — COMPREHENSIVE METABOLIC PANEL
ALT: 19 U/L (ref 0–44)
AST: 21 U/L (ref 15–41)
Albumin: 3.9 g/dL (ref 3.5–5.0)
Alkaline Phosphatase: 61 U/L (ref 38–126)
Anion gap: 7 (ref 5–15)
BUN: 10 mg/dL (ref 8–23)
CO2: 27 mmol/L (ref 22–32)
Calcium: 9.6 mg/dL (ref 8.9–10.3)
Chloride: 103 mmol/L (ref 98–111)
Creatinine, Ser: 1.15 mg/dL — ABNORMAL HIGH (ref 0.44–1.00)
GFR, Estimated: 53 mL/min — ABNORMAL LOW (ref >60)
Glucose, Bld: 86 mg/dL (ref 70–99)
Potassium: 4.7 mmol/L (ref 3.5–5.1)
Sodium: 137 mmol/L (ref 135–145)
Total Bilirubin: 0.6 mg/dL (ref 0.3–1.2)
Total Protein: 7.6 g/dL (ref 6.5–8.1)

## 2020-12-02 NOTE — Progress Notes (Signed)
[name] [address], [place] [address]   CLINIC:  Medical Oncology/Hematology  PCP:  [name], MD [address] [place] [address] [phone]   REASON FOR VISIT:  Follow-up for metastatic right lung cancer  PRIOR THERAPY:  1. Bevacizumab, cisplatin and pemetrexed x 4 cycles from 09/02/2014 to 11/04/2014. 2. Bevacizumab and pemetrexed x 3 cycles from 12/24/2014 to 02/04/2015.  NGS Results: Not done  CURRENT THERAPY: Nivolumab every 4 weeks  BRIEF ONCOLOGIC HISTORY:  Oncology History  Cancer of upper lobe of right lung ([place])  07/28/2014 Imaging   CT chest: Large R apical mass consistent with malignancy. This is destroying the R 2nd rib with extension into adjacent soft tissue. R hilar adenopathy with R 5cm adrenal metastatic lesion.   08/01/2014 Initial Biopsy   Lung, needle/core biopsy(ies), right upper lobe - POORLY DIFFERENTIATED ADENOCARCINOMA, SEE COMMENT.   08/08/2014 PET scan   Large hypermetabolic R apical mass with evidence of direct chest wall and mediastinal invasion, right retrocrural lymphadenopathy, extensive retroperitoneal lymphadenopathy, and metastatic lesions to the adrenal glands    09/02/2014 - 11/04/2014 Chemotherapy   Cisplatin/Pemetrexed/Avastin every 21 days x 4 cycles   10/07/2014 - 10/27/2014 Radiation Therapy   Right lung apex for control of brachioplexopathy.   12/24/2014 - 02/25/2015 Chemotherapy   Alimta/Avastin every 21 days.   02/20/2015 Imaging   Increase in size of right adrenal metastasis and subjacent confluent retrocaval lymphadenopathy   02/25/2015 -  Chemotherapy   Nivolumab, zometa   05/04/2015 Imaging   CT CAP- Stable to slight decrease in the posterior right apical lesion. Stable appearance of posterior right upper rib an upper thoracic bony lesions. Slight improvement in right upper lobe tree-in-bud opacity. No new or progressive findings in...   07/28/2015 Imaging   CT CAP- Reduced size of the  right apical pleural parenchymal lesion and reduced size of the right adrenal metastatic lesion. Resolution of prior retrocrural adenopathy.  Right eccentric T1 and T2 sclerosis with sclerosis and tapering of the right second..   11/17/2015 Imaging   CT CAP- Stable soft tissue thickening in the apex of the right hemi thorax. Stable right adrenal metastasis. Nodularity along the trachea and mainstem bronchi, relatively new from 07/28/2015, favoring adherent debris.   11/18/2015 Treatment Plan Change   Zometa HELD for upcoming tooth extraction   11/24/2015 Treatment Plan Change   Zometa on hold at this time in preparation for tooth extraction in March 2017.  Zometa las given on 11/18/2015.   02/03/2016 Imaging   CT CAP- Heterogeneous right apical masslike consolidation and right adrenal metastasis are unchanged   04/06/2016 Treatment Plan Change   Zometa restarted 6 weeks out from tooth extraction (04/06/2016)   05/12/2016 Imaging   CT CAP- NED in the chest, abdomen or pelvis.  Some areas of nodularity associated with the mainstem bronchi in the left upper lobe bronchus, favored to represent adherent inspissated secretions   08/17/2016 Imaging   CT CAP- 1. Stable CTs of the chest and abdomen. No evidence of progressive metastatic disease. 2. Probable treated tumor at the right apex, right adrenal gland and T2 vertebral body, stable. 3. Fluctuating nodularity along the walls of the trachea and mainstem bronchi, likely secretions.   12/12/2016 Imaging   Further decrease in size of treated tumor within the right apex. 2. Stable treated tumor involving the right second rib and T2 vertebra. 3. Stable right adrenal gland treated tumor. 4. Emphysema 5. Aortic atherosclerosis  01/04/2017 Imaging   MRI brain- Normal brain MRI.  No intracranial metastatic disease.   03/15/2017 Imaging   CT CAP- 1. No new or progressive metastatic disease in the chest or abdomen. 2. Stable treated tumor in the  apical right upper lobe. Stable treated right posterior second rib and right T2 vertebral lesions. Stable treated right adrenal metastasis. 3. Aortic atherosclerosis. 4. Moderate emphysema with mild diffuse bronchial wall thickening, suggesting COPD.   06/12/2017 Imaging   CT CAP 1. Similar appearance of treated primary within the right apex. 2. Similar areas of sclerosis within the right second rib, T2, and less so T1 vertebral bodies. These are most consistent with treated metastasis. 3. Similar right adrenal treated metastasis. 4. No evidence of new or progressive disease. 5. Similar right and progressive left areas of bronchial wall thickening and probable mucoid impaction. Correlate with interval infectious symptoms. 6.  Emphysema (ICD10-J43.9). 7. Coronary artery atherosclerosis. Aortic Atherosclerosis (ICD10-I70.0).    10/16/2017 Imaging   CT CAP 1. Stable appearance of the prior Pancoast tumor and related bony findings in the right second rib and right T1 and T2 vertebra compatible with successfully treated tumor. No significant enlargement or new lesions are identified. Similarly the treated right adrenal metastatic lesion is stable in appearance. 2. Upper normal size right hilar lymph node may warrant surveillance. Currently 9 mm in short axis. 3. Other imaging findings of potential clinical significance: Aortic Atherosclerosis (ICD10-I70.0) and Emphysema (ICD10-J43.9). Scattered proximal sigmoid colon diverticula. Mucus plugging medially in the left upper lobe and posteriorly in the right upper lobe.      CANCER STAGING: Cancer Staging No matching staging information was found for the patient.  INTERVAL HISTORY:  Melissa Sullivan, a 65 y.o. female, returns for routine follow-up and consideration for next cycle of chemotherapy. Viveka was last seen on 09/10/2020.  Due for cycle #40 of nivolumab today.   Overall, she tells me she has been feeling pretty  well.   Overall, she feels ready for next cycle of chemo today.    REVIEW OF SYSTEMS:  Review of Systems  All other systems reviewed and are negative.   PAST MEDICAL/SURGICAL HISTORY:  Past Medical History:  Diagnosis Date  . Adrenal mass, right ([place]) 07/28/2014  . Anemia   . Bone metastases ([place]) 04/05/2016  . Cancer ([place]) 2015   lung  right  . Diabetes mellitus without complication ([place])   . GERD (gastroesophageal reflux disease)   . Hyperlipidemia   . Hypertension   . Hypothyroidism due to medication 01/30/2017  . Lung mass 07/28/2014  . Reflux    Past Surgical History:  Procedure Laterality Date  . AMPUTATION Right 02/22/2017   Procedure: PARTIAL AMPUTATION RIGHT GREAT TOE;  Surgeon: Caprice Beaver, DPM;  Location: [facility] ORS;  Service: Podiatry;  Laterality: Right;  . APPENDECTOMY    . BIOPSY  09/22/2020   Procedure: BIOPSY;  Surgeon: Eloise Harman, DO;  Location: [facility] ENDO SUITE;  Service: Endoscopy;;  . COLONOSCOPY WITH PROPOFOL N/A 09/22/2020   Procedure: COLONOSCOPY WITH PROPOFOL;  Surgeon: Eloise Harman, DO;  Location: [facility] ENDO SUITE;  Service: Endoscopy;  Laterality: N/A;  2:15pm, per office, pt know new arrival time  . ESOPHAGOGASTRODUODENOSCOPY N/A 12/09/2014   BTD:VVOHYW esophageal stricture/mild-to-noderate erosive gastritis. negative H.pylori  . FLEXIBLE SIGMOIDOSCOPY  2011   Dr. [name]: hyperplastic polyp  . LUNG BIOPSY Right 07/2014   CT guided  . MALONEY DILATION N/A 12/09/2014   Procedure: MALONEY DILATION;  Surgeon: Marga Melnick  Fields, MD;  Location: [facility] ENDO SUITE;  Service: Endoscopy;  Laterality: N/A;  . PORTACATH PLACEMENT Left 09/01/2014  . SAVORY DILATION N/A 12/09/2014   Procedure: SAVORY DILATION;  Surgeon: [name], MD;  Location: [facility] ENDO SU[facility];  Service: Endoscopy;  Laterality: N/A;    SOCIAL HISTORY:  Social History   Socioeconomic History  . Marital status: Married    Spouse name: Not on file  . Number of children: Not on file  . Years of  education: Not on file  . Highest education level: Not on file  Occupational History  . Not on file  Tobacco Use  . Smoking status: Light Tobacco Smoker    Packs/day: 0.30    Years: 20.00    Pack years: 6.00    Types: Cigarettes  . Smokeless tobacco: Never Used  Vaping Use  . Vaping Use: Never used  Substance and Sexual Activity  . Alcohol use: No  . Drug use: No  . Sexual activity: Not on file  Other Topics Concern  . Not on file  Social History Narrative  . Not on file   Social Determinants of Health   Financial Resource Strain: Not on file  Food Insecurity: Not on file  Transportation Needs: Not on file  Physical Activity: Not on file  Stress: Not on file  Social Connections: Not on file  Intimate Partner Violence: Not on file    FAMILY HISTORY:  Family History  Problem Relation Age of Onset  . Cancer Sister   . Colon cancer Neg Hx     CURRENT MEDICATIONS:  Current Outpatient Medications  Medication Sig Dispense Refill  . albuterol (VENTOLIN HFA) 108 (90 Base) MCG/ACT inhaler Inhale 2 puffs into the lungs every 6 (six) hours as needed for wheezing or shortness of breath.     . dexlansoprazole (DEXILANT) 60 MG capsule 1 PO EVERY MORNING WITH BREAKFAST. (Patient taking differently: Take 60 mg by mouth daily with breakfast. ) 30 capsule 11  . dronabinol (MARINOL) 2.5 MG capsule Take 1 capsule (2.5 mg total) by mouth 2 (two) times daily before lunch and supper. 60 capsule 2  . ENSURE (ENSURE) Take 1 Can by mouth 4 (four) times daily.    . Ferrous Sulfate (IRON) 325 (65 Fe) MG TABS Take 325 mg by mouth daily.     . Fluticasone-Salmeterol (ADVAIR DISKUS) 500-50 MCG/DOSE AEPB Inhale 1 puff into the lungs 2 (two) times daily. 60 each 6  . ibuprofen (ADVIL,MOTRIN) 200 MG tablet Take 400 mg by mouth every 8 (eight) hours as needed (for pain.).     Marland Kitchen levocetirizine (XYZAL) 5 MG tablet Take 5 mg by mouth every evening.     Marland Kitchen levothyroxine (SYNTHROID) 25 MCG tablet TAKE 1/2  (ONE-HALF) TABLET BY MOUTH ONCE DAILY BEFORE BREAKFAST 45 tablet 0  . lidocaine-prilocaine (EMLA) cream Apply 1 application topically as needed (port access).    . Nivolumab (OPDIVO IV) Inject into the vein every 28 (twenty-eight) days.    . Oxycodone HCl 10 MG TABS Take 1 tablet (10 mg total) by mouth every 12 (twelve) hours as needed (pain.). 60 tablet 0  . pseudoephedrine-guaifenesin (MUCINEX D) 60-600 MG 12 hr tablet Take 1 tablet by mouth daily as needed for congestion.    . rosuvastatin (CRESTOR) 20 MG tablet Take 20 mg by mouth daily.     No current facility-administered medications for this visit.   Facility-Administered Medications Ordered in Other Visits  Medication Dose Route Frequency Provider Last Rate Last  Admin  . sodium chloride flush (NS) 0.9 % injection 10 mL  10 mL Intracatheter PRN Derek Jack, MD   10 mL at 01/03/19 0930    ALLERGIES:  Allergies  Allergen Reactions  . Xgeva [Denosumab]     osteonecrosis  . Omeprazole     Made legs hurt    PHYSICAL EXAM:  Performance status (ECOG): 0 - Asymptomatic  There were no vitals filed for this visit. Wt Readings from Last 3 Encounters:  11/05/20 105 lb 6.1 oz (47.8 kg)  10/08/20 103 lb 4.8 oz (46.9 kg)  09/10/20 102 lb 3.2 oz (46.4 kg)   Physical Exam Vitals reviewed.  Constitutional:      Appearance: Normal appearance.  Cardiovascular:     Rate and Rhythm: Normal rate and regular rhythm.     Heart sounds: Normal heart sounds.  Pulmonary:     Effort: Pulmonary effort is normal.     Breath sounds: Normal breath sounds.  Abdominal:     General: There is no distension.     Palpations: Abdomen is soft. There is no mass.  Musculoskeletal:        General: No swelling.  Skin:    General: Skin is warm.  Neurological:     General: No focal deficit present.     Mental Status: She is alert and oriented to person, place, and time.  Psychiatric:        Mood and Affect: Mood normal.        Behavior:  Behavior normal.     LABORATORY DATA:  I have reviewed the labs as listed.  CBC Latest Ref Rng & Units 12/02/2020 11/05/2020 10/08/2020  WBC 4.0 - 10.5 K/uL 8.0 9.3 8.4  Hemoglobin 12.0 - 15.0 g/dL 14.1 13.5 13.9  Hematocrit 36.0 - 46.0 % 43.7 42.0 43.8  Platelets 150 - 400 K/uL 271 277 314   CMP Latest Ref Rng & Units 12/02/2020 11/05/2020 10/08/2020  Glucose 70 - 99 mg/dL 86 95 86  BUN 8 - 23 mg/dL 10 16 11   Creatinine 0.44 - 1.00 mg/dL 1.15(H) 1.21(H) 1.17(H)  Sodium 135 - 145 mmol/L 137 134(L) 137  Potassium 3.5 - 5.1 mmol/L 4.7 4.8 4.0  Chloride 98 - 111 mmol/L 103 101 104  CO2 22 - 32 mmol/L 27 26 26   Calcium 8.9 - 10.3 mg/dL 9.6 10.0 9.9  Total Protein 6.5 - 8.1 g/dL 7.6 7.8 8.1  Total Bilirubin 0.3 - 1.2 mg/dL 0.6 0.6 0.5  Alkaline Phos 38 - 126 U/L 61 62 76  AST 15 - 41 U/L 21 31 17   ALT 0 - 44 U/L 19 35 13    DIAGNOSTIC IMAGING:  I have independently reviewed the scans and discussed with the patient. No results found.   ASSESSMENT:  1. Cancer of upper lobe of right lung [facility]) Metastatic adenocarcinoma of the lung to the bones and right adrenal: -Foundation 1 testing shows 14 genomic alterations, no approved therapies. -Opdivo 480 mg monthly started on February 25, 2015. -CT CAP on February 21, 2020 shows no evidence of progression or metastatic disease. T2 sclerotic lesion is stable. -CTAP on 08/10/2020 with no evidence of recurrent metastatic disease in the abdomen or pelvis. Irregular mildly enlarged right adrenal gland unchanged. -CT chest on 09/09/2020 showed partial clearing of the airspace opacities noted medially in the lingula and left lower lobe.  New irregular nodule posteriorly at the right apex likely inflammatory.  Stable sclerotic T2 vertebral body lesion.   PLAN:  1. Metastatic  lung adenocarcinoma: -She does not report any immunotherapy related side effects. - Reviewed labs from 12/12/2020.  LFTs are normal.  CBC was normal. - Proceed with immunotherapy  with nivolumab monthly. - RTC 3 months with repeat CT CAP.  2. High risk drug monitoring: -TSH today is 2.4.  Continue close monitoring.  3. Health maintenance: -Mammogram on 06/23/2020 was BI-RADS Category 1.  4. CKD: -Baseline creatinine 1.2-1.3.  Today creatinine improved to 1.15.  5. Bone metastasis: -Bisphosphonates held due to ONJ in 2019.   Orders placed this encounter:  No orders of the defined types were placed in this encounter.    Derek Jack, MD [place] [phone]   I, Milinda Antis, am acting as a scribe for Dr. Sanda Linger.  I, Derek Jack MD, have reviewed the above documentation for accuracy and completeness, and I agree with the above.

## 2020-12-03 ENCOUNTER — Inpatient Hospital Stay (HOSPITAL_COMMUNITY): Payer: Medicare HMO

## 2020-12-03 ENCOUNTER — Inpatient Hospital Stay (HOSPITAL_BASED_OUTPATIENT_CLINIC_OR_DEPARTMENT_OTHER): Payer: Medicare HMO | Admitting: Hematology

## 2020-12-03 ENCOUNTER — Other Ambulatory Visit: Payer: Self-pay

## 2020-12-03 ENCOUNTER — Encounter (HOSPITAL_COMMUNITY): Payer: Self-pay | Admitting: Hematology

## 2020-12-03 VITALS — BP 122/74 | HR 84 | Temp 98.4°F | Resp 17 | Wt 106.0 lb

## 2020-12-03 VITALS — BP 114/66 | HR 77 | Resp 16

## 2020-12-03 DIAGNOSIS — C3411 Malignant neoplasm of upper lobe, right bronchus or lung: Secondary | ICD-10-CM

## 2020-12-03 DIAGNOSIS — Z923 Personal history of irradiation: Secondary | ICD-10-CM | POA: Diagnosis not present

## 2020-12-03 DIAGNOSIS — C7971 Secondary malignant neoplasm of right adrenal gland: Secondary | ICD-10-CM | POA: Diagnosis not present

## 2020-12-03 DIAGNOSIS — Z5112 Encounter for antineoplastic immunotherapy: Secondary | ICD-10-CM | POA: Diagnosis not present

## 2020-12-03 DIAGNOSIS — R69 Illness, unspecified: Secondary | ICD-10-CM | POA: Diagnosis not present

## 2020-12-03 DIAGNOSIS — Z79899 Other long term (current) drug therapy: Secondary | ICD-10-CM | POA: Diagnosis not present

## 2020-12-03 DIAGNOSIS — C7951 Secondary malignant neoplasm of bone: Secondary | ICD-10-CM | POA: Diagnosis not present

## 2020-12-03 DIAGNOSIS — E278 Other specified disorders of adrenal gland: Secondary | ICD-10-CM

## 2020-12-03 DIAGNOSIS — Z9221 Personal history of antineoplastic chemotherapy: Secondary | ICD-10-CM | POA: Diagnosis not present

## 2020-12-03 MED ORDER — SODIUM CHLORIDE 0.9% FLUSH
10.0000 mL | INTRAVENOUS | Status: DC | PRN
  Administered 2020-12-03: 10 mL

## 2020-12-03 MED ORDER — HEPARIN SOD (PORK) LOCK FLUSH 100 UNIT/ML IV SOLN
500.0000 [IU] | Freq: Once | INTRAVENOUS | Status: AC | PRN
  Administered 2020-12-03: 500 [IU]

## 2020-12-03 MED ORDER — SODIUM CHLORIDE 0.9 % IV SOLN: Freq: Once | INTRAVENOUS | Status: AC

## 2020-12-03 MED ORDER — SODIUM CHLORIDE 0.9 % IV SOLN
480.0000 mg | Freq: Once | INTRAVENOUS | Status: AC
  Administered 2020-12-03: 480 mg via INTRAVENOUS
  Filled 2020-12-03: qty 48

## 2020-12-03 NOTE — Progress Notes (Signed)
Patient assessed and labs reviewed by Dr. Katragadda. Okay to proceed with treatment. Primary RN and pharmacy aware. 

## 2020-12-03 NOTE — Progress Notes (Signed)
Melissa Sullivan presents today for D1C40 Opdivo. Pt denies any new changes or symptoms since last treatment. Lab results and vitals have been reviewed and are stable and within parameters for treatment. Patient has been assessed by Dr. Delton Coombes who has approved proceeding with treatment today as planned.  Infusions tolerated without incident or complaint. VSS upon completion of treatment. Port flushed and deaccessed per protocol, see MAR and IV flowsheet for details. Discharged in satisfactory condition with follow up instructions.

## 2020-12-03 NOTE — Patient Instructions (Signed)
[facility] Discharge Instructions for Patients Receiving Chemotherapy   Beginning January 23rd 2017 lab work for the [facility] will be done in the  Main lab at [facility] on 1st floor. If you have a lab appointment with the [facility] please come in thru the  Main Entrance and check in at the main information desk   Today you received the following chemotherapy agents Opdivo  To help prevent nausea and vomiting after your treatment, we encourage you to take your nausea medication   If you develop nausea and vomiting, or diarrhea that is not controlled by your medication, call the clinic.  The clinic phone number is [phone]. Office hours are Monday-Friday 8:30am-5:00pm.  BELOW ARE SYMPTOMS THAT SHOULD BE REPORTED IMMEDIATELY:  *FEVER GREATER THAN 101.0 F  *CHILLS WITH OR WITHOUT FEVER  NAUSEA AND VOMITING THAT IS NOT CONTROLLED WITH YOUR NAUSEA MEDICATION  *UNUSUAL SHORTNESS OF BREATH  *UNUSUAL BRUISING OR BLEEDING  TENDERNESS IN MOUTH AND THROAT WITH OR WITHOUT PRESENCE OF ULCERS  *URINARY PROBLEMS  *BOWEL PROBLEMS  UNUSUAL RASH Items with * indicate a potential emergency and should be followed up as soon as possible. If you have an emergency after office hours please contact your primary care physician or go to the nearest emergency department.  Please call the clinic during office hours if you have any questions or concerns.   You may also contact the Patient Navigator at [phone] should you have any questions or need assistance in obtaining follow up care.      Resources For Cancer Patients and their Caregivers ? American Cancer Society: Can assist with transportation, wigs, general needs, runs Look Good Feel Better.        (248)782-5289 ? Cancer Care: Provides financial assistance, online support groups, medication/co-pay assistance.  1-800-813-HOPE 630-587-1888) ? [place] Assists [place] cancer  patients and their families through emotional , educational and financial support.  (810) 820-0757 ? [place] DSS Where to apply for food stamps, Medicaid and utility assistance. 229-454-4914 ? [facility]: Transportation to medical appointments. (313)620-8255 ? Social Security Administration: May apply for disability if have a Stage IV cancer. (820) 565-0372 (763) 741-0316 ? [employer], Disability and Transit Services: Assists with nutrition, care and transit needs. 5314421314

## 2020-12-03 NOTE — Patient Instructions (Signed)
[facility] at [facility] Discharge Instructions  You were seen today by Dr. Katragadda. Follow up as scheduled.   Thank you for choosing [facility] at [facility] to provide your oncology and hematology care.  To afford each patient quality time with our provider, please arrive at least 15 minutes before your scheduled appointment time.   If you have a lab appointment with the Cancer Center please come in thru the Main Entrance and check in at the main information desk.  You need to re-schedule your appointment should you arrive 10 or more minutes late.  We strive to give you quality time with our providers, and arriving late affects you and other patients whose appointments are after yours.  Also, if you no show three or more times for appointments you may be dismissed from the clinic at the providers discretion.     Again, thank you for choosing [facility].  Our hope is that these requests will decrease the amount of time that you wait before being seen by our physicians.       _____________________________________________________________  Should you have questions after your visit to [facility], please contact our office at [phone] and follow the prompts.  Our office hours are 8:00 a.m. and 4:30 p.m. Monday - Friday.  Please note that voicemails left after 4:00 p.m. may not be returned until the following business day.  We are closed weekends and major holidays.  You do have access to a nurse 24-7, just call the main number to the clinic [phone] and do not press any options, hold on the line and a nurse will answer the phone.    For prescription refill requests, have your pharmacy contact our office and allow 72 hours.    Due to Covid, you will need to wear a mask upon entering the hospital. If you do not have a mask, a mask will be given to you at the Main Entrance upon arrival. For doctor visits, patients  may have 1 support person age 18 or older with them. For treatment visits, patients can not have anyone with them due to social distancing guidelines and our immunocompromised population.      

## 2020-12-31 ENCOUNTER — Encounter (HOSPITAL_COMMUNITY): Payer: Self-pay

## 2020-12-31 ENCOUNTER — Inpatient Hospital Stay (HOSPITAL_COMMUNITY): Payer: Medicare HMO | Attending: Hematology

## 2020-12-31 ENCOUNTER — Inpatient Hospital Stay (HOSPITAL_COMMUNITY): Payer: Medicare HMO

## 2020-12-31 ENCOUNTER — Other Ambulatory Visit: Payer: Self-pay

## 2020-12-31 VITALS — BP 115/69 | HR 81 | Temp 96.8°F | Resp 16 | Wt 107.2 lb

## 2020-12-31 DIAGNOSIS — C7951 Secondary malignant neoplasm of bone: Secondary | ICD-10-CM | POA: Insufficient documentation

## 2020-12-31 DIAGNOSIS — Z9221 Personal history of antineoplastic chemotherapy: Secondary | ICD-10-CM | POA: Diagnosis not present

## 2020-12-31 DIAGNOSIS — C3411 Malignant neoplasm of upper lobe, right bronchus or lung: Secondary | ICD-10-CM

## 2020-12-31 DIAGNOSIS — Z79899 Other long term (current) drug therapy: Secondary | ICD-10-CM | POA: Diagnosis not present

## 2020-12-31 DIAGNOSIS — E278 Other specified disorders of adrenal gland: Secondary | ICD-10-CM

## 2020-12-31 DIAGNOSIS — R69 Illness, unspecified: Secondary | ICD-10-CM | POA: Diagnosis not present

## 2020-12-31 DIAGNOSIS — C7971 Secondary malignant neoplasm of right adrenal gland: Secondary | ICD-10-CM | POA: Diagnosis not present

## 2020-12-31 DIAGNOSIS — Z5112 Encounter for antineoplastic immunotherapy: Secondary | ICD-10-CM | POA: Insufficient documentation

## 2020-12-31 DIAGNOSIS — Z923 Personal history of irradiation: Secondary | ICD-10-CM | POA: Diagnosis not present

## 2020-12-31 DIAGNOSIS — F17210 Nicotine dependence, cigarettes, uncomplicated: Secondary | ICD-10-CM | POA: Insufficient documentation

## 2020-12-31 LAB — CBC WITH DIFFERENTIAL/PLATELET
Abs Immature Granulocytes: 0.02 10*3/uL (ref 0.00–0.07)
Basophils Absolute: 0 10*3/uL (ref 0.0–0.1)
Basophils Relative: 0 %
Eosinophils Absolute: 0.3 10*3/uL (ref 0.0–0.5)
Eosinophils Relative: 3 %
HCT: 43.3 % (ref 36.0–46.0)
Hemoglobin: 13.9 g/dL (ref 12.0–15.0)
Immature Granulocytes: 0 %
Lymphocytes Relative: 25 %
Lymphs Abs: 2.1 10*3/uL (ref 0.7–4.0)
MCH: 30.5 pg (ref 26.0–34.0)
MCHC: 32.1 g/dL (ref 30.0–36.0)
MCV: 95.2 fL (ref 80.0–100.0)
Monocytes Absolute: 0.7 10*3/uL (ref 0.1–1.0)
Monocytes Relative: 8 %
Neutro Abs: 5.3 10*3/uL (ref 1.7–7.7)
Neutrophils Relative %: 64 %
Platelets: 257 10*3/uL (ref 150–400)
RBC: 4.55 MIL/uL (ref 3.87–5.11)
RDW: 15.1 % (ref 11.5–15.5)
WBC: 8.4 10*3/uL (ref 4.0–10.5)
nRBC: 0 % (ref 0.0–0.2)

## 2020-12-31 LAB — COMPREHENSIVE METABOLIC PANEL
ALT: 17 U/L (ref 0–44)
AST: 22 U/L (ref 15–41)
Albumin: 4.3 g/dL (ref 3.5–5.0)
Alkaline Phosphatase: 64 U/L (ref 38–126)
Anion gap: 5 (ref 5–15)
BUN: 13 mg/dL (ref 8–23)
CO2: 26 mmol/L (ref 22–32)
Calcium: 9.8 mg/dL (ref 8.9–10.3)
Chloride: 103 mmol/L (ref 98–111)
Creatinine, Ser: 1.22 mg/dL — ABNORMAL HIGH (ref 0.44–1.00)
GFR, Estimated: 50 mL/min — ABNORMAL LOW (ref >60)
Glucose, Bld: 84 mg/dL (ref 70–99)
Potassium: 3.9 mmol/L (ref 3.5–5.1)
Sodium: 134 mmol/L — ABNORMAL LOW (ref 135–145)
Total Bilirubin: 0.4 mg/dL (ref 0.3–1.2)
Total Protein: 7.9 g/dL (ref 6.5–8.1)

## 2020-12-31 LAB — TSH: TSH: 2.518 u[IU]/mL (ref 0.350–4.500)

## 2020-12-31 MED ORDER — SODIUM CHLORIDE 0.9 % IV SOLN: Freq: Once | INTRAVENOUS | Status: AC

## 2020-12-31 MED ORDER — SODIUM CHLORIDE 0.9 % IV SOLN
480.0000 mg | Freq: Once | INTRAVENOUS | Status: AC
  Administered 2020-12-31: 480 mg via INTRAVENOUS
  Filled 2020-12-31: qty 48

## 2020-12-31 MED ORDER — HEPARIN SOD (PORK) LOCK FLUSH 100 UNIT/ML IV SOLN
500.0000 [IU] | Freq: Once | INTRAVENOUS | Status: AC | PRN
  Administered 2020-12-31: 500 [IU]

## 2020-12-31 NOTE — Progress Notes (Signed)
Infusion tolerated without incident or complaint. VSS upon completion of treatment. Port flushed and deaccessed per protocol, see MAR and IV flowsheet for details. Discharged ambulatory in satisfactory condition with follow up instructions. 

## 2021-01-08 DIAGNOSIS — G629 Polyneuropathy, unspecified: Secondary | ICD-10-CM | POA: Diagnosis not present

## 2021-01-08 DIAGNOSIS — B351 Tinea unguium: Secondary | ICD-10-CM | POA: Diagnosis not present

## 2021-01-28 ENCOUNTER — Inpatient Hospital Stay (HOSPITAL_COMMUNITY): Payer: Medicare HMO | Attending: Hematology

## 2021-01-28 ENCOUNTER — Inpatient Hospital Stay (HOSPITAL_COMMUNITY): Payer: Medicare HMO

## 2021-01-28 ENCOUNTER — Other Ambulatory Visit: Payer: Self-pay

## 2021-01-28 VITALS — BP 110/62 | HR 76 | Temp 96.7°F | Resp 18 | Wt 106.8 lb

## 2021-01-28 DIAGNOSIS — E278 Other specified disorders of adrenal gland: Secondary | ICD-10-CM

## 2021-01-28 DIAGNOSIS — C7951 Secondary malignant neoplasm of bone: Secondary | ICD-10-CM | POA: Diagnosis not present

## 2021-01-28 DIAGNOSIS — Z79899 Other long term (current) drug therapy: Secondary | ICD-10-CM | POA: Diagnosis not present

## 2021-01-28 DIAGNOSIS — F17210 Nicotine dependence, cigarettes, uncomplicated: Secondary | ICD-10-CM | POA: Diagnosis not present

## 2021-01-28 DIAGNOSIS — C3411 Malignant neoplasm of upper lobe, right bronchus or lung: Secondary | ICD-10-CM

## 2021-01-28 DIAGNOSIS — R69 Illness, unspecified: Secondary | ICD-10-CM | POA: Diagnosis not present

## 2021-01-28 DIAGNOSIS — Z923 Personal history of irradiation: Secondary | ICD-10-CM | POA: Diagnosis not present

## 2021-01-28 DIAGNOSIS — Z5112 Encounter for antineoplastic immunotherapy: Secondary | ICD-10-CM | POA: Insufficient documentation

## 2021-01-28 DIAGNOSIS — Z9221 Personal history of antineoplastic chemotherapy: Secondary | ICD-10-CM | POA: Diagnosis not present

## 2021-01-28 DIAGNOSIS — C7971 Secondary malignant neoplasm of right adrenal gland: Secondary | ICD-10-CM | POA: Diagnosis not present

## 2021-01-28 LAB — COMPREHENSIVE METABOLIC PANEL
ALT: 15 U/L (ref 0–44)
AST: 20 U/L (ref 15–41)
Albumin: 4.1 g/dL (ref 3.5–5.0)
Alkaline Phosphatase: 64 U/L (ref 38–126)
Anion gap: 8 (ref 5–15)
BUN: 14 mg/dL (ref 8–23)
CO2: 26 mmol/L (ref 22–32)
Calcium: 10 mg/dL (ref 8.9–10.3)
Chloride: 101 mmol/L (ref 98–111)
Creatinine, Ser: 1.22 mg/dL — ABNORMAL HIGH (ref 0.44–1.00)
GFR, Estimated: 50 mL/min — ABNORMAL LOW (ref >60)
Glucose, Bld: 102 mg/dL — ABNORMAL HIGH (ref 70–99)
Potassium: 4.2 mmol/L (ref 3.5–5.1)
Sodium: 135 mmol/L (ref 135–145)
Total Bilirubin: 0.3 mg/dL (ref 0.3–1.2)
Total Protein: 7.9 g/dL (ref 6.5–8.1)

## 2021-01-28 LAB — CBC WITH DIFFERENTIAL/PLATELET
Abs Immature Granulocytes: 0.03 10*3/uL (ref 0.00–0.07)
Basophils Absolute: 0.1 10*3/uL (ref 0.0–0.1)
Basophils Relative: 1 %
Eosinophils Absolute: 0.2 10*3/uL (ref 0.0–0.5)
Eosinophils Relative: 3 %
HCT: 44.1 % (ref 36.0–46.0)
Hemoglobin: 14.4 g/dL (ref 12.0–15.0)
Immature Granulocytes: 0 %
Lymphocytes Relative: 30 %
Lymphs Abs: 2.5 10*3/uL (ref 0.7–4.0)
MCH: 31 pg (ref 26.0–34.0)
MCHC: 32.7 g/dL (ref 30.0–36.0)
MCV: 94.8 fL (ref 80.0–100.0)
Monocytes Absolute: 0.6 10*3/uL (ref 0.1–1.0)
Monocytes Relative: 7 %
Neutro Abs: 5 10*3/uL (ref 1.7–7.7)
Neutrophils Relative %: 59 %
Platelets: 313 10*3/uL (ref 150–400)
RBC: 4.65 MIL/uL (ref 3.87–5.11)
RDW: 15.3 % (ref 11.5–15.5)
WBC: 8.5 10*3/uL (ref 4.0–10.5)
nRBC: 0 % (ref 0.0–0.2)

## 2021-01-28 LAB — TSH: TSH: 2.314 u[IU]/mL (ref 0.350–4.500)

## 2021-01-28 MED ORDER — HEPARIN SOD (PORK) LOCK FLUSH 100 UNIT/ML IV SOLN
500.0000 [IU] | Freq: Once | INTRAVENOUS | Status: AC | PRN
  Administered 2021-01-28: 500 [IU]

## 2021-01-28 MED ORDER — SODIUM CHLORIDE 0.9 % IV SOLN: Freq: Once | INTRAVENOUS | Status: AC

## 2021-01-28 MED ORDER — SODIUM CHLORIDE 0.9 % IV SOLN
480.0000 mg | Freq: Once | INTRAVENOUS | Status: AC
  Administered 2021-01-28: 480 mg via INTRAVENOUS
  Filled 2021-01-28: qty 48

## 2021-01-28 NOTE — Progress Notes (Signed)
Patient presents today for Opdivo infusion.  Vital signs and labs within parameters for treatment.  Patient has no new complaints since last visit.  Opdivo infusion given today per MD orders.  Stable during infusion without adverse affects.  Vital signs stable.  No complaints at this time.  Discharge from clinic ambulatory in stable condition.  Alert and oriented X 3.  Follow up with [facility] as scheduled.

## 2021-01-28 NOTE — Patient Instructions (Signed)
Nivolumab infusion What is this medicine? NIVOLUMAB (nye VOL ue mab) is a monoclonal antibody. It treats certain types of cancer. Some of the cancers treated are colon cancer, head and neck cancer, Hodgkin lymphoma, lung cancer, and melanoma. This medicine may be used for other purposes; ask your health care provider or pharmacist if you have questions. COMMON BRAND NAME(S): Opdivo What should I tell my health care provider before I take this medicine? They need to know if you have any of these conditions:  autoimmune diseases like Crohn's disease, ulcerative colitis, or lupus  have had or planning to have an allogeneic stem cell transplant (uses someone else's stem cells)  history of chest radiation  history of organ transplant  nervous system problems like myasthenia gravis or Guillain-Barre syndrome  an unusual or allergic reaction to nivolumab, other medicines, foods, dyes, or preservatives  pregnant or trying to get pregnant  breast-feeding How should I use this medicine? This medicine is for infusion into a vein. It is given by a health care professional in a hospital or clinic setting. A special MedGuide will be given to you before each treatment. Be sure to read this information carefully each time. Talk to your pediatrician regarding the use of this medicine in children. While this drug may be prescribed for children as young as 12 years for selected conditions, precautions do apply. Overdosage: If you think you have taken too much of this medicine contact a poison control center or emergency room at once. NOTE: This medicine is only for you. Do not share this medicine with others. What if I miss a dose? It is important not to miss your dose. Call your doctor or health care professional if you are unable to keep an appointment. What may interact with this medicine? Interactions have not been studied. This list may not describe all possible interactions. Give your health care  provider a list of all the medicines, herbs, non-prescription drugs, or dietary supplements you use. Also tell them if you smoke, drink alcohol, or use illegal drugs. Some items may interact with your medicine. What should I watch for while using this medicine? This drug may make you feel generally unwell. Continue your course of treatment even though you feel ill unless your doctor tells you to stop. You may need blood work done while you are taking this medicine. Do not become pregnant while taking this medicine or for 5 months after stopping it. Women should inform their doctor if they wish to become pregnant or think they might be pregnant. There is a potential for serious side effects to an unborn child. Talk to your health care professional or pharmacist for more information. Do not breast-feed an infant while taking this medicine or for 5 months after stopping it. What side effects may I notice from receiving this medicine? Side effects that you should report to your doctor or health care professional as soon as possible:  allergic reactions like skin rash, itching or hives, swelling of the face, lips, or tongue  breathing problems  blood in the urine  bloody or watery diarrhea or black, tarry stools  changes in emotions or moods  changes in vision  chest pain  cough  dizziness  feeling faint or lightheaded, falls  fever, chills  headache with fever, neck stiffness, confusion, loss of memory, sensitivity to light, hallucination, loss of contact with reality, or seizures  joint pain  mouth sores  redness, blistering, peeling or loosening of the skin, including inside the   mouth  severe muscle pain or weakness  signs and symptoms of high blood sugar such as dizziness; dry mouth; dry skin; fruity breath; nausea; stomach pain; increased hunger or thirst; increased urination  signs and symptoms of kidney injury like trouble passing urine or change in the amount of  urine  signs and symptoms of liver injury like dark yellow or brown urine; general ill feeling or flu-like symptoms; light-colored stools; loss of appetite; nausea; right upper belly pain; unusually weak or tired; yellowing of the eyes or skin  swelling of the ankles, feet, hands  trouble passing urine or change in the amount of urine  unusually weak or tired  weight gain or loss Side effects that usually do not require medical attention (report to your doctor or health care professional if they continue or are bothersome):  bone pain  constipation  decreased appetite  diarrhea  muscle pain  nausea, vomiting  tiredness This list may not describe all possible side effects. Call your doctor for medical advice about side effects. You may report side effects to FDA at 1-800-FDA-1088. Where should I keep my medicine? This drug is given in a hospital or clinic and will not be stored at home. NOTE: This sheet is a summary. It may not cover all possible information. If you have questions about this medicine, talk to your doctor, pharmacist, or health care provider.  2021 Elsevier/Gold Standard (2020-03-11 10:08:25)  

## 2021-02-08 ENCOUNTER — Other Ambulatory Visit (HOSPITAL_COMMUNITY): Payer: Self-pay | Admitting: Medical

## 2021-02-08 DIAGNOSIS — E032 Hypothyroidism due to medicaments and other exogenous substances: Secondary | ICD-10-CM

## 2021-02-09 ENCOUNTER — Other Ambulatory Visit: Payer: Self-pay

## 2021-02-12 ENCOUNTER — Other Ambulatory Visit: Payer: Self-pay

## 2021-02-12 MED ORDER — DEXLANSOPRAZOLE 60 MG PO CPDR: DELAYED_RELEASE_CAPSULE | ORAL | 3 refills | Status: DC

## 2021-02-15 ENCOUNTER — Telehealth: Payer: Self-pay | Admitting: Internal Medicine

## 2021-02-15 NOTE — Telephone Encounter (Signed)
Pt came to the front desk asking if we could send a prescription of Dexilant 60 mg to [facility]. [phone]

## 2021-02-16 ENCOUNTER — Other Ambulatory Visit (HOSPITAL_COMMUNITY): Payer: Self-pay

## 2021-02-16 ENCOUNTER — Other Ambulatory Visit (HOSPITAL_COMMUNITY): Payer: Self-pay | Admitting: Surgery

## 2021-02-16 DIAGNOSIS — E032 Hypothyroidism due to medicaments and other exogenous substances: Secondary | ICD-10-CM

## 2021-02-16 MED ORDER — LEVOTHYROXINE SODIUM 25 MCG PO TABS: ORAL_TABLET | ORAL | 0 refills | Status: DC

## 2021-02-17 DIAGNOSIS — D538 Other specified nutritional anemias: Secondary | ICD-10-CM | POA: Diagnosis not present

## 2021-02-17 DIAGNOSIS — N1831 Chronic kidney disease, stage 3a: Secondary | ICD-10-CM | POA: Diagnosis not present

## 2021-02-17 DIAGNOSIS — E440 Moderate protein-calorie malnutrition: Secondary | ICD-10-CM | POA: Diagnosis not present

## 2021-02-17 DIAGNOSIS — C799 Secondary malignant neoplasm of unspecified site: Secondary | ICD-10-CM | POA: Diagnosis not present

## 2021-02-17 DIAGNOSIS — M87180 Osteonecrosis due to drugs, jaw: Secondary | ICD-10-CM | POA: Diagnosis not present

## 2021-02-17 DIAGNOSIS — R634 Abnormal weight loss: Secondary | ICD-10-CM | POA: Diagnosis not present

## 2021-02-17 DIAGNOSIS — C3490 Malignant neoplasm of unspecified part of unspecified bronchus or lung: Secondary | ICD-10-CM | POA: Diagnosis not present

## 2021-02-17 NOTE — Telephone Encounter (Signed)
Phoned and LM on pt's vm that Rx was phoned to her pharmacy on 02/12/2021 by the refill team.

## 2021-02-19 ENCOUNTER — Inpatient Hospital Stay (HOSPITAL_COMMUNITY): Payer: Medicare HMO | Attending: Hematology and Oncology

## 2021-02-19 ENCOUNTER — Other Ambulatory Visit (HOSPITAL_COMMUNITY): Payer: Self-pay

## 2021-02-19 ENCOUNTER — Other Ambulatory Visit: Payer: Self-pay

## 2021-02-19 ENCOUNTER — Ambulatory Visit (HOSPITAL_COMMUNITY)
Admission: RE | Admit: 2021-02-19 | Discharge: 2021-02-19 | Disposition: A | Discharge status: Home / Self Care | Payer: Medicare HMO | Source: Ambulatory Visit | Attending: Hematology | Admitting: Hematology

## 2021-02-19 DIAGNOSIS — Z5112 Encounter for antineoplastic immunotherapy: Secondary | ICD-10-CM | POA: Insufficient documentation

## 2021-02-19 DIAGNOSIS — F17210 Nicotine dependence, cigarettes, uncomplicated: Secondary | ICD-10-CM | POA: Insufficient documentation

## 2021-02-19 DIAGNOSIS — C7971 Secondary malignant neoplasm of right adrenal gland: Secondary | ICD-10-CM | POA: Diagnosis not present

## 2021-02-19 DIAGNOSIS — C3411 Malignant neoplasm of upper lobe, right bronchus or lung: Secondary | ICD-10-CM | POA: Diagnosis not present

## 2021-02-19 DIAGNOSIS — Z9221 Personal history of antineoplastic chemotherapy: Secondary | ICD-10-CM | POA: Insufficient documentation

## 2021-02-19 DIAGNOSIS — K5750 Diverticulosis of both small and large intestine without perforation or abscess without bleeding: Secondary | ICD-10-CM | POA: Diagnosis not present

## 2021-02-19 DIAGNOSIS — I700 Atherosclerosis of aorta: Secondary | ICD-10-CM | POA: Diagnosis not present

## 2021-02-19 DIAGNOSIS — J929 Pleural plaque without asbestos: Secondary | ICD-10-CM | POA: Diagnosis not present

## 2021-02-19 DIAGNOSIS — Z923 Personal history of irradiation: Secondary | ICD-10-CM | POA: Insufficient documentation

## 2021-02-19 DIAGNOSIS — Z79899 Other long term (current) drug therapy: Secondary | ICD-10-CM | POA: Diagnosis not present

## 2021-02-19 DIAGNOSIS — E278 Other specified disorders of adrenal gland: Secondary | ICD-10-CM | POA: Diagnosis not present

## 2021-02-19 DIAGNOSIS — E032 Hypothyroidism due to medicaments and other exogenous substances: Secondary | ICD-10-CM

## 2021-02-19 DIAGNOSIS — I2510 Atherosclerotic heart disease of native coronary artery without angina pectoris: Secondary | ICD-10-CM | POA: Diagnosis not present

## 2021-02-19 DIAGNOSIS — R69 Illness, unspecified: Secondary | ICD-10-CM | POA: Diagnosis not present

## 2021-02-19 DIAGNOSIS — C7951 Secondary malignant neoplasm of bone: Secondary | ICD-10-CM | POA: Insufficient documentation

## 2021-02-19 DIAGNOSIS — J438 Other emphysema: Secondary | ICD-10-CM | POA: Diagnosis not present

## 2021-02-19 LAB — COMPREHENSIVE METABOLIC PANEL WITH GFR
ALT: 15 U/L (ref 0–44)
AST: 19 U/L (ref 15–41)
Albumin: 4.1 g/dL (ref 3.5–5.0)
Alkaline Phosphatase: 67 U/L (ref 38–126)
Anion gap: 8 (ref 5–15)
BUN: 15 mg/dL (ref 8–23)
CO2: 25 mmol/L (ref 22–32)
Calcium: 9.5 mg/dL (ref 8.9–10.3)
Chloride: 103 mmol/L (ref 98–111)
Creatinine, Ser: 1.18 mg/dL — ABNORMAL HIGH (ref 0.44–1.00)
GFR, Estimated: 52 mL/min — ABNORMAL LOW
Glucose, Bld: 99 mg/dL (ref 70–99)
Potassium: 4 mmol/L (ref 3.5–5.1)
Sodium: 136 mmol/L (ref 135–145)
Total Bilirubin: 0.5 mg/dL (ref 0.3–1.2)
Total Protein: 8.1 g/dL (ref 6.5–8.1)

## 2021-02-19 LAB — CBC WITH DIFFERENTIAL/PLATELET
Abs Immature Granulocytes: 0.02 K/uL (ref 0.00–0.07)
Basophils Absolute: 0 K/uL (ref 0.0–0.1)
Basophils Relative: 0 %
Eosinophils Absolute: 0.2 K/uL (ref 0.0–0.5)
Eosinophils Relative: 2 %
HCT: 42.3 % (ref 36.0–46.0)
Hemoglobin: 13.7 g/dL (ref 12.0–15.0)
Immature Granulocytes: 0 %
Lymphocytes Relative: 27 %
Lymphs Abs: 2 K/uL (ref 0.7–4.0)
MCH: 30.7 pg (ref 26.0–34.0)
MCHC: 32.4 g/dL (ref 30.0–36.0)
MCV: 94.8 fL (ref 80.0–100.0)
Monocytes Absolute: 0.6 K/uL (ref 0.1–1.0)
Monocytes Relative: 8 %
Neutro Abs: 4.6 K/uL (ref 1.7–7.7)
Neutrophils Relative %: 63 %
Platelets: 273 K/uL (ref 150–400)
RBC: 4.46 MIL/uL (ref 3.87–5.11)
RDW: 15 % (ref 11.5–15.5)
WBC: 7.4 K/uL (ref 4.0–10.5)
nRBC: 0 % (ref 0.0–0.2)

## 2021-02-19 LAB — TSH: TSH: 1.931 u[IU]/mL (ref 0.350–4.500)

## 2021-02-19 MED ORDER — LEVOTHYROXINE SODIUM 25 MCG PO TABS: ORAL_TABLET | ORAL | 0 refills | Status: DC

## 2021-02-19 MED ORDER — IOHEXOL 300 MG/ML  SOLN
75.0000 mL | Freq: Once | INTRAMUSCULAR | Status: AC | PRN
  Administered 2021-02-19: 75 mL via INTRAVENOUS

## 2021-02-25 ENCOUNTER — Other Ambulatory Visit: Payer: Self-pay

## 2021-02-25 ENCOUNTER — Inpatient Hospital Stay (HOSPITAL_BASED_OUTPATIENT_CLINIC_OR_DEPARTMENT_OTHER): Payer: Medicare HMO | Admitting: Hematology

## 2021-02-25 ENCOUNTER — Inpatient Hospital Stay (HOSPITAL_COMMUNITY): Payer: Medicare HMO

## 2021-02-25 VITALS — BP 125/78 | HR 94 | Temp 96.8°F | Resp 18 | Wt 105.0 lb

## 2021-02-25 VITALS — BP 128/77 | HR 90 | Temp 97.5°F | Resp 16

## 2021-02-25 DIAGNOSIS — C7971 Secondary malignant neoplasm of right adrenal gland: Secondary | ICD-10-CM | POA: Diagnosis not present

## 2021-02-25 DIAGNOSIS — C3411 Malignant neoplasm of upper lobe, right bronchus or lung: Secondary | ICD-10-CM

## 2021-02-25 DIAGNOSIS — Z9221 Personal history of antineoplastic chemotherapy: Secondary | ICD-10-CM | POA: Diagnosis not present

## 2021-02-25 DIAGNOSIS — Z923 Personal history of irradiation: Secondary | ICD-10-CM | POA: Diagnosis not present

## 2021-02-25 DIAGNOSIS — Z79899 Other long term (current) drug therapy: Secondary | ICD-10-CM | POA: Diagnosis not present

## 2021-02-25 DIAGNOSIS — R69 Illness, unspecified: Secondary | ICD-10-CM | POA: Diagnosis not present

## 2021-02-25 DIAGNOSIS — E278 Other specified disorders of adrenal gland: Secondary | ICD-10-CM

## 2021-02-25 DIAGNOSIS — Z5112 Encounter for antineoplastic immunotherapy: Secondary | ICD-10-CM | POA: Diagnosis not present

## 2021-02-25 DIAGNOSIS — C7951 Secondary malignant neoplasm of bone: Secondary | ICD-10-CM | POA: Diagnosis not present

## 2021-02-25 MED ORDER — SODIUM CHLORIDE 0.9 % IV SOLN
Freq: Once | INTRAVENOUS | Status: AC
Start: 2021-02-25 — End: 2021-02-25

## 2021-02-25 MED ORDER — SODIUM CHLORIDE 0.9% FLUSH
10.0000 mL | INTRAVENOUS | Status: DC | PRN
  Administered 2021-02-25: 10 mL

## 2021-02-25 MED ORDER — HEPARIN SOD (PORK) LOCK FLUSH 100 UNIT/ML IV SOLN
500.0000 [IU] | Freq: Once | INTRAVENOUS | Status: AC | PRN
  Administered 2021-02-25: 500 [IU]

## 2021-02-25 MED ORDER — SODIUM CHLORIDE 0.9 % IV SOLN
480.0000 mg | Freq: Once | INTRAVENOUS | Status: AC
  Administered 2021-02-25: 480 mg via INTRAVENOUS
  Filled 2021-02-25: qty 48

## 2021-02-25 NOTE — Patient Instructions (Signed)
[place] at [facility] Discharge Instructions  You were seen today by Dr. Delton Coombes. He went over your recent results and scans. You received your treatment today; continue getting your treatment every month. Do shoulder exercises every morning to lubricate the joints in your arms. Dr. Delton Coombes will see you back in 3 months for labs and follow up.   Thank you for choosing [place] at [facility] to provide your oncology and hematology care.  To afford each patient quality time with our provider, please arrive at least 15 minutes before your scheduled appointment time.   If you have a lab appointment with the [facility] please come in thru the Main Entrance and check in at the main information desk  You need to re-schedule your appointment should you arrive 10 or more minutes late.  We strive to give you quality time with our providers, and arriving late affects you and other patients whose appointments are after yours.  Also, if you no show three or more times for appointments you may be dismissed from the clinic at the providers discretion.     Again, thank you for choosing [facility]  Our hope is that these requests will decrease the amount of time that you wait before being seen by our physicians.       _____________________________________________________________  Should you have questions after your visit to [facility], please contact our office at [phone] between the hours of 8:00 a.m. and 4:30 p.m.  Voicemails left after 4:00 p.m. will not be returned until the following business day.  For prescription refill requests, have your pharmacy contact our office and allow 72 hours.    Cancer Center Support Programs:   > Cancer Support Group  2nd Tuesday of the month 1pm-2pm, Journey Room

## 2021-02-25 NOTE — Progress Notes (Signed)
[facility] [address] [place] [address]   CLINIC:  Medical Oncology/Hematology  PCP:  Melissa Squibb, MD [address] [name] [phone]   REASON FOR VISIT:  Follow-up for metastatic right lung cancer  PRIOR THERAPY:  1. Bevacizumab, cisplatin and pemetrexed x 4 cycles from 09/02/2014 to 11/04/2014. 2. Bevacizumab and pemetrexed x 3 cycles from 12/24/2014 to 02/04/2015.  NGS Results: Not done  CURRENT THERAPY: Nivolumab every 4 weeks  BRIEF ONCOLOGIC HISTORY:  Oncology History  Cancer of upper lobe of right lung ([facility])  07/28/2014 Imaging   CT chest: Large R apical mass consistent with malignancy. This is destroying the R 2nd rib with extension into adjacent soft tissue. R hilar adenopathy with R 5cm adrenal metastatic lesion.   08/01/2014 Initial Biopsy   Lung, needle/core biopsy(ies), right upper lobe - POORLY DIFFERENTIATED ADENOCARCINOMA, SEE COMMENT.   08/08/2014 PET scan   Large hypermetabolic R apical mass with evidence of direct chest wall and mediastinal invasion, right retrocrural lymphadenopathy, extensive retroperitoneal lymphadenopathy, and metastatic lesions to the adrenal glands    09/02/2014 - 11/04/2014 Chemotherapy   Cisplatin/Pemetrexed/Avastin every 21 days x 4 cycles   10/07/2014 - 10/27/2014 Radiation Therapy   Right lung apex for control of brachioplexopathy.   12/24/2014 - 02/25/2015 Chemotherapy   Alimta/Avastin every 21 days.   02/20/2015 Imaging   Increase in size of right adrenal metastasis and subjacent confluent retrocaval lymphadenopathy   02/25/2015 -  Chemotherapy   Nivolumab, zometa   05/04/2015 Imaging   CT CAP- Stable to slight decrease in the posterior right apical lesion. Stable appearance of posterior right upper rib an upper thoracic bony lesions. Slight improvement in right upper lobe tree-in-bud opacity. No new or progressive findings in...   07/28/2015 Imaging   CT CAP- Reduced size of the  right apical pleural parenchymal lesion and reduced size of the right adrenal metastatic lesion. Resolution of prior retrocrural adenopathy.  Right eccentric T1 and T2 sclerosis with sclerosis and tapering of the right second..   11/17/2015 Imaging   CT CAP- Stable soft tissue thickening in the apex of the right hemi thorax. Stable right adrenal metastasis. Nodularity along the trachea and mainstem bronchi, relatively new from 07/28/2015, favoring adherent debris.   11/18/2015 Treatment Plan Change   Zometa HELD for upcoming tooth extraction   11/24/2015 Treatment Plan Change   Zometa on hold at this time in preparation for tooth extraction in March 2017.  Zometa las given on 11/18/2015.   02/03/2016 Imaging   CT CAP- Heterogeneous right apical masslike consolidation and right adrenal metastasis are unchanged   04/06/2016 Treatment Plan Change   Zometa restarted 6 weeks out from tooth extraction (04/06/2016)   05/12/2016 Imaging   CT CAP- NED in the chest, abdomen or pelvis.  Some areas of nodularity associated with the mainstem bronchi in the left upper lobe bronchus, favored to represent adherent inspissated secretions   08/17/2016 Imaging   CT CAP- 1. Stable CTs of the chest and abdomen. No evidence of progressive metastatic disease. 2. Probable treated tumor at the right apex, right adrenal gland and T2 vertebral body, stable. 3. Fluctuating nodularity along the walls of the trachea and mainstem bronchi, likely secretions.   12/12/2016 Imaging   Further decrease in size of treated tumor within the right apex. 2. Stable treated tumor involving the right second rib and T2 vertebra. 3. Stable right adrenal gland treated tumor. 4. Emphysema 5. Aortic atherosclerosis  01/04/2017 Imaging   MRI brain- Normal brain MRI.  No intracranial metastatic disease.   03/15/2017 Imaging   CT CAP- 1. No new or progressive metastatic disease in the chest or abdomen. 2. Stable treated tumor in the  apical right upper lobe. Stable treated right posterior second rib and right T2 vertebral lesions. Stable treated right adrenal metastasis. 3. Aortic atherosclerosis. 4. Moderate emphysema with mild diffuse bronchial wall thickening, suggesting COPD.   06/12/2017 Imaging   CT CAP 1. Similar appearance of treated primary within the right apex. 2. Similar areas of sclerosis within the right second rib, T2, and less so T1 vertebral bodies. These are most consistent with treated metastasis. 3. Similar right adrenal treated metastasis. 4. No evidence of new or progressive disease. 5. Similar right and progressive left areas of bronchial wall thickening and probable mucoid impaction. Correlate with interval infectious symptoms. 6.  Emphysema (ICD10-J43.9). 7. Coronary artery atherosclerosis. Aortic Atherosclerosis (ICD10-I70.0).    10/16/2017 Imaging   CT CAP 1. Stable appearance of the prior Pancoast tumor and related bony findings in the right second rib and right T1 and T2 vertebra compatible with successfully treated tumor. No significant enlargement or new lesions are identified. Similarly the treated right adrenal metastatic lesion is stable in appearance. 2. Upper normal size right hilar lymph node may warrant surveillance. Currently 9 mm in short axis. 3. Other imaging findings of potential clinical significance: Aortic Atherosclerosis (ICD10-I70.0) and Emphysema (ICD10-J43.9). Scattered proximal sigmoid colon diverticula. Mucus plugging medially in the left upper lobe and posteriorly in the right upper lobe.      CANCER STAGING: Cancer Staging No matching staging information was found for the patient.  INTERVAL HISTORY:  Melissa Sullivan, a 65 y.o. female, returns for routine follow-up and consideration for next cycle of immunotherapy. Melissa Sullivan was last seen on 12/03/2020.  Due for cycle #43 of nivolumab today.   Overall, she tells me she has been feeling  pretty well. She notes that when she sleeps on her left side, her right shoulder starts pulling and she has to switch to lying on her right side; she denies having the same issue when she lies on her right side. She has noted having the pain for the past 2 months She admits to waking up with stiffness in her joints, but it resolves quickly.  Her daughter was hospitalized after eating a hamburger, so she was spending time in the hospital and has not been eating as well.  Overall, she feels ready for next cycle of immunotherapy today.    REVIEW OF SYSTEMS:  Review of Systems  Constitutional: Positive for unexpected weight change (losing 1 lb every month). Negative for appetite change and fatigue.  Musculoskeletal: Positive for arthralgias (shoulder pain).  All other systems reviewed and are negative.   PAST MEDICAL/SURGICAL HISTORY:  Past Medical History:  Diagnosis Date  . Adrenal mass, right ([facility]) 07/28/2014  . Anemia   . Bone metastases ([facility]) 04/05/2016  . Cancer ([facility]) 2015   lung  right  . Diabetes mellitus without complication ([facility])   . GERD (gastroesophageal reflux disease)   . Hyperlipidemia   . Hypertension   . Hypothyroidism due to medication 01/30/2017  . Lung mass 07/28/2014  . Reflux    Past Surgical History:  Procedure Laterality Date  . AMPUTATION Right 02/22/2017   Procedure: PARTIAL AMPUTATION RIGHT GREAT TOE;  Surgeon: Caprice Beaver, DPM;  Location: [facility];  Service: Podiatry;  Laterality: Right;  . APPENDECTOMY    .  BIOPSY  09/22/2020   Procedure: BIOPSY;  Surgeon: Eloise Harman, DO;  Location: [facility];  Service: Endoscopy;;  . COLONOSCOPY WITH PROPOFOL N/A 09/22/2020   Procedure: COLONOSCOPY WITH PROPOFOL;  Surgeon: Eloise Harman, DO;  Location: [facility];  Service: Endoscopy;  Laterality: N/A;  2:15pm, per office, pt know new arrival time  . ESOPHAGOGASTRODUODENOSCOPY N/A 12/09/2014   DVV:OHYWVP esophageal stricture/mild-to-noderate erosive  gastritis. negative H.pylori  . FLEXIBLE SIGMOIDOSCOPY  2011   Dr. [name]: hyperplastic polyp  . LUNG BIOPSY Right 07/2014   CT guided  . MALONEY DILATION N/A 12/09/2014   Procedure: [name] DILATION;  Surgeon: [name], MD;  Location: [facility];  Service: Endoscopy;  Laterality: N/A;  . PORTACATH PLACEMENT Left 09/01/2014  . SAVORY DILATION N/A 12/09/2014   Procedure: SAVORY DILATION;  Surgeon: [name], MD;  Location: [facility];  Service: Endoscopy;  Laterality: N/A;    SOCIAL HISTORY:  Social History   Socioeconomic History  . Marital status: Married    Spouse name: Not on file  . Number of children: Not on file  . Years of education: Not on file  . Highest education level: Not on file  Occupational History  . Not on file  Tobacco Use  . Smoking status: Light Tobacco Smoker    Packs/day: 0.30    Years: 20.00    Pack years: 6.00    Types: Cigarettes  . Smokeless tobacco: Never Used  Vaping Use  . Vaping Use: Never used  Substance and Sexual Activity  . Alcohol use: No  . Drug use: No  . Sexual activity: Not on file  Other Topics Concern  . Not on file  Social History Narrative  . Not on file   Social Determinants of Health   Financial Resource Strain: Low Risk   . Difficulty of Paying Living Expenses: Not hard at all  Food Insecurity: No Food Insecurity  . Worried About Charity fundraiser in the Last Year: Never true  . Ran Out of Food in the Last Year: Never true  Transportation Needs: No Transportation Needs  . Lack of Transportation (Medical): No  . Lack of Transportation (Non-Medical): No  Physical Activity: Not on file  Stress: No Stress Concern Present  . Feeling of Stress : Only a little  Social Connections: Moderately Isolated  . Frequency of Communication with Friends and Family: More than three times a week  . Frequency of Social Gatherings with Friends and Family: More than three times a week  . Attends Religious Services: Never   . Active Member of Clubs or Organizations: No  . Attends Archivist Meetings: Never  . Marital Status: Married  Human resources officer Violence: Not At Risk  . Fear of Current or Ex-Partner: No  . Emotionally Abused: No  . Physically Abused: No  . Sexually Abused: No    FAMILY HISTORY:  Family History  Problem Relation Age of Onset  . Cancer Sister   . Colon cancer Neg Hx     CURRENT MEDICATIONS:  Current Outpatient Medications  Medication Sig Dispense Refill  . albuterol (VENTOLIN HFA) 108 (90 Base) MCG/ACT inhaler Inhale 2 puffs into the lungs every 6 (six) hours as needed for wheezing or shortness of breath.     . dexlansoprazole (DEXILANT) 60 MG capsule 1 PO EVERY MORNING WITH BREAKFAST. 30 capsule 3  . dronabinol (MARINOL) 2.5 MG capsule Take 1 capsule (2.5 mg total) by mouth 2 (two) times  daily before lunch and supper. 60 capsule 2  . ENSURE (ENSURE) Take 1 Can by mouth 4 (four) times daily.    . Ferrous Sulfate (IRON) 325 (65 Fe) MG TABS Take 325 mg by mouth daily.     . Fluticasone-Salmeterol (ADVAIR DISKUS) 500-50 MCG/DOSE AEPB Inhale 1 puff into the lungs 2 (two) times daily. 60 each 6  . ibuprofen (ADVIL,MOTRIN) 200 MG tablet Take 400 mg by mouth every 8 (eight) hours as needed (for pain.).     Marland Kitchen levocetirizine (XYZAL) 5 MG tablet Take 5 mg by mouth every evening.     Marland Kitchen levothyroxine (SYNTHROID) 25 MCG tablet TAKE 1/2 (ONE-HALF) TABLET BY MOUTH ONCE DAILY BEFORE BREAKFAST 45 tablet 0  . lidocaine-prilocaine (EMLA) cream Apply 1 application topically as needed (port access).    . Nivolumab (OPDIVO IV) Inject into the vein every 28 (twenty-eight) days.    . Oxycodone HCl 10 MG TABS Take 1 tablet (10 mg total) by mouth every 12 (twelve) hours as needed (pain.). 60 tablet 0  . pseudoephedrine-guaifenesin (MUCINEX D) 60-600 MG 12 hr tablet Take 1 tablet by mouth daily as needed for congestion.    . rosuvastatin (CRESTOR) 20 MG tablet Take 20 mg by mouth daily.     No  current facility-administered medications for this visit.   Facility-Administered Medications Ordered in Other Visits  Medication Dose Route Frequency Provider Last Rate Last Admin  . sodium chloride flush (NS) 0.9 % injection 10 mL  10 mL Intracatheter PRN Derek Jack, MD   10 mL at 01/03/19 0930    ALLERGIES:  Allergies  Allergen Reactions  . Xgeva [Denosumab]     osteonecrosis  . Omeprazole     Made legs hurt    PHYSICAL EXAM:  Performance status (ECOG): 0 - Asymptomatic  Vitals:   02/25/21 1311  BP: 125/78  Pulse: 94  Resp: 18  Temp: (!) 96.8 F (36 C)  SpO2: 98%   Wt Readings from Last 3 Encounters:  02/25/21 105 lb (47.6 kg)  01/28/21 106 lb 12.8 oz (48.4 kg)  12/31/20 107 lb 3.2 oz (48.6 kg)   Physical Exam Vitals reviewed.  Constitutional:      Appearance: Normal appearance.  Cardiovascular:     Rate and Rhythm: Normal rate and regular rhythm.     Pulses: Normal pulses.     Heart sounds: Normal heart sounds.  Pulmonary:     Effort: Pulmonary effort is normal.     Breath sounds: Normal breath sounds.  Chest:     Comments: Port-a-Cath in L chest Neurological:     General: No focal deficit present.     Mental Status: She is alert and oriented to person, place, and time.  Psychiatric:        Mood and Affect: Mood normal.        Behavior: Behavior normal.     LABORATORY DATA:  I have reviewed the labs as listed.  CBC Latest Ref Rng & Units 02/19/2021 01/28/2021 12/31/2020  WBC 4.0 - 10.5 K/uL 7.4 8.5 8.4  Hemoglobin 12.0 - 15.0 g/dL 13.7 14.4 13.9  Hematocrit 36.0 - 46.0 % 42.3 44.1 43.3  Platelets 150 - 400 K/uL 273 313 257   CMP Latest Ref Rng & Units 02/19/2021 01/28/2021 12/31/2020  Glucose 70 - 99 mg/dL 99 102(H) 84  BUN 8 - 23 mg/dL 15 14 13   Creatinine 0.44 - 1.00 mg/dL 1.18(H) 1.22(H) 1.22(H)  Sodium 135 - 145 mmol/L 136 135 134(L)  Potassium  3.5 - 5.1 mmol/L 4.0 4.2 3.9  Chloride 98 - 111 mmol/L 103 101 103  CO2 22 - 32 mmol/L 25 26  26   Calcium 8.9 - 10.3 mg/dL 9.5 10.0 9.8  Total Protein 6.5 - 8.1 g/dL 8.1 7.9 7.9  Total Bilirubin 0.3 - 1.2 mg/dL 0.5 0.3 0.4  Alkaline Phos 38 - 126 U/L 67 64 64  AST 15 - 41 U/L 19 20 22   ALT 0 - 44 U/L 15 15 17     DIAGNOSTIC IMAGING:  I have independently reviewed the scans and discussed with the patient. CT CHEST ABDOMEN PELVIS W CONTRAST  Result Date: 02/19/2021 CLINICAL DATA:  Non-small cell lung cancer, RIGHT upper lobe lung cancer in a 65 year old female, restaging evaluation. EXAM: CT CHEST, ABDOMEN, AND PELVIS WITH CONTRAST TECHNIQUE: Multidetector CT imaging of the chest, abdomen and pelvis was performed following the standard protocol during bolus administration of intravenous contrast. CONTRAST:  66mL OMNIPAQUE IOHEXOL 300 MG/ML  SOLN COMPARISON:  09/09/2020 FINDINGS: CT CHEST FINDINGS Cardiovascular: LEFT-sided Port-A-Cath in situ, terminates in the proximal superior vena cava. Normal heart size. Calcified and noncalcified atheromatous plaque of the thoracic aorta. No aneurysmal dilation. Central pulmonary arteries with normal caliber. Limited assessment on venous phase. Mediastinum/Nodes: Patulous esophagus. Stable to slight interval decrease in size of mediastinal lymph nodes largest approximately 9 mm in the RIGHT hilum and in the subcarinal region. No new adenopathy. No axillary adenopathy. No thoracic inlet lymphadenopathy. Lungs/Pleura: Pleural and parenchymal scarring at the LEFT lung apex with associated calcification the site of previous mass shows no change. Adjacent irregular nodule measures 1.8 x 0.7 cm as compared to 1.9 x 0.9 cm. Marked pulmonary emphysema. Inspissated secretion within bronchial structures of the LEFT upper lobe along the mediastinal border shows no change. Airways are patent otherwise. Septal thickening associated with scarring in the RIGHT upper lobe shows a similar appearance. Bandlike scarring in the lingula and RIGHT middle lobe unchanged.  Musculoskeletal: See below for full musculoskeletal details. CT ABDOMEN PELVIS FINDINGS Hepatobiliary: Liver without focal, suspicious hepatic lesion. No pericholecystic stranding. No biliary duct dilation. Pancreas: Normal, without mass, inflammation or ductal dilatation. Spleen: Normal spleen. Adrenals/Urinary Tract: Adrenal glands with stable calcified lesion of the RIGHT adrenal measuring 2.3 x 1.2 cm LEFT adrenal is normal. Symmetric renal enhancement.  No suspicious renal lesion. Urinary bladder with smooth contour. Stomach/Bowel: No acute gastrointestinal process. Small bowel is normal caliber. Partially formed stool in the colon. Colonic diverticulosis of distal colon. Vascular/Lymphatic: Calcified and noncalcified atheromatous plaque of the abdominal aorta. There is no gastrohepatic or hepatoduodenal ligament lymphadenopathy. No retroperitoneal or mesenteric lymphadenopathy. Smooth contour of the IVC. No pelvic sidewall lymphadenopathy. Reproductive: Unremarkable by CT. Other: No ascites. Musculoskeletal: No acute bone finding. No new destructive bone process. Sclerosis of the T2 vertebral body along its RIGHT lateral aspect and medial rib and posterior elements of T2 with similar appearance to prior imaging IMPRESSION: 1. Stable to slight interval decrease in size of mediastinal lymph nodes. 2. Stable post treatment changes in the LEFT lung apex with slight decrease in size of adjacent irregular nodule. 3. Stable sclerotic lesion of the T2 vertebral body along its RIGHT lateral aspect and medial rib and posterior elements of T2 with similar appearance to prior imaging. 4. No new or progressive findings. 5. Stable calcified RIGHT adrenal mass. 6. Emphysema and aortic atherosclerosis. Aortic Atherosclerosis (ICD10-I70.0) and Emphysema (ICD10-J43.9). Electronically Signed   By: Zetta Bills M.D.   On: 02/19/2021 17:29  ASSESSMENT:  1. Cancer of upper lobe of right lung [facility]) Metastatic  adenocarcinoma of the lung to the bones and right adrenal: -Foundation 1 testing shows 14 genomic alterations, no approved therapies. -Opdivo 480 mg monthly started on February 25, 2015. -CT CAP on February 21, 2020 shows no evidence of progression or metastatic disease. T2 sclerotic lesion is stable. -CTAP on 08/10/2020 with no evidence of recurrent metastatic disease in the abdomen or pelvis. Irregular mildly enlarged right adrenal gland unchanged. -CT chest on 09/09/2020 showed partial clearing of the airspace opacities noted medially in the lingula and left lower lobe. New irregular nodule posteriorly at the right apex likely inflammatory. Stable sclerotic T2 vertebral body lesion.   PLAN:  1. Metastatic lung adenocarcinoma: -She is continuing to tolerate immunotherapy very well.  No immunotherapy related side effects noted. -Reviewed her labs which showed normal LFTs and CBC.  TSH was normal. -Reviewed CT CAP from 02/19/2021 which showed stable to slight decrease in size of mediastinal lymph nodes.  Stable posttreatment changes in the left lung apex and slight decrease in size of the absent irregular nodule.  Stable sclerotic lesion in T2 vertebral body.  No new progressive findings. -She will continue monthly opdivo. -We talked about lung map trial.  She is willing to consider it. -RTC 3 months for follow-up.  2. High risk drug monitoring: -TSH is 1.91.  Continue close monitoring.  3. Health maintenance: -Mammogram on 06/23/2020 was BI-RADS Category 1.  She will have mammogram in August.  4. CKD: -Baseline creatinine around 1.2-1.3.  Today creatinine improved to 1.18.  5. Bone metastasis: -Bisphosphonates are on hold due to ONJ in 2019.   Orders placed this encounter:  No orders of the defined types were placed in this encounter.    Derek Jack, MD [facility] [phone]   I, Milinda Antis, am acting as a scribe for Dr. Sanda Linger.  I,  Derek Jack MD, have reviewed the above documentation for accuracy and completeness, and I agree with the above.

## 2021-02-25 NOTE — Patient Instructions (Signed)
[facility] Discharge Instructions for Patients Receiving Chemotherapy  Today you received the following chemotherapy agents   To help prevent nausea and vomiting after your treatment, we encourage you to take your nausea medication   If you develop nausea and vomiting that is not controlled by your nausea medication, call the clinic.   BELOW ARE SYMPTOMS THAT SHOULD BE REPORTED IMMEDIATELY:  *FEVER GREATER THAN 100.5 F  *CHILLS WITH OR WITHOUT FEVER  NAUSEA AND VOMITING THAT IS NOT CONTROLLED WITH YOUR NAUSEA MEDICATION  *UNUSUAL SHORTNESS OF BREATH  *UNUSUAL BRUISING OR BLEEDING  TENDERNESS IN MOUTH AND THROAT WITH OR WITHOUT PRESENCE OF ULCERS  *URINARY PROBLEMS  *BOWEL PROBLEMS  UNUSUAL RASH Items with * indicate a potential emergency and should be followed up as soon as possible.  Feel free to call the clinic should you have any questions or concerns. The clinic phone number is [phone].  Please show the CHEMO ALERT CARD at check-in to the Emergency Department and triage nurse.   

## 2021-02-25 NOTE — Progress Notes (Signed)
Patient was assessed by Dr. Katragadda and labs have been reviewed.  Patient is okay to proceed with treatment today. Primary RN and pharmacy aware.   

## 2021-02-25 NOTE — Progress Notes (Signed)
Treatment given per orders. Patient tolerated it well without problems. Vitals stable and discharged home from clinic ambulatory. Follow up as scheduled.  

## 2021-03-02 ENCOUNTER — Telehealth: Payer: Self-pay

## 2021-03-02 NOTE — Telephone Encounter (Signed)
Melissa Sullivan,  this pt insurance will only cover Prilosec or Protonix. Please advise . Dexilant not covered because its gone generic. We don't know how to advise or get the pt information regarding getting the generic.

## 2021-03-04 NOTE — Telephone Encounter (Signed)
This came to me because I was the refill person that week. She has an appt with Vicente Males next week so I will defer to her to try and figure out the PPI situation at follow-up.  CC: Melissa Sullivan

## 2021-03-04 NOTE — Telephone Encounter (Signed)
noted 

## 2021-03-08 NOTE — Progress Notes (Signed)
Referring Provider: [name], MD Primary Care Physician:  [name], MD Primary GI: Dr. Abbey Chatters  Chief Complaint  Patient presents with  . Gastroesophageal Reflux    Out of dexilant. Started omeprazole as soon as ran out but it makes her legs hurt.    HPI:   Melissa Sullivan is a 65 y.o. female presenting today with a history of stage IV lung adenocarcinoma to the bones and right adrenal, last EGD in Jan 2016 with distal esophageal stricture s/p dilation, mild to moderate erosive gastritis. Negative KOH. Colonoscopy arranged in 2016 but risks outweighed the benefits due to history of advanced cancer. Flex sig in 2011 with hyperplastic polyps. Colonoscopy in interim from last visit Nov 2021 with non-bleeding internal hemorrhoids, sigmoid and descending colon diverticulosis, localized mild inflammation in sigmoid that was felt due to prep artifact.   Office contacted that [place] not covered. Went back to omeprazole once daily, but this makes her legs hurt. Appetite is good as long as taking Marinol. No nausea. Needs something to control GERD. No dysphagia. No abdominal pain.   Past Medical History:  Diagnosis Date  . Adrenal mass, right ([place]) 07/28/2014  . Anemia   . Bone metastases ([name]) 04/05/2016  . Cancer ([place]) 2015   lung  right  . Diabetes mellitus without complication ([place])   . GERD (gastroesophageal reflux disease)   . Hyperlipidemia   . Hypertension   . Hypothyroidism due to medication 01/30/2017  . Lung mass 07/28/2014  . Reflux     Past Surgical History:  Procedure Laterality Date  . AMPUTATION Right 02/22/2017   Procedure: PARTIAL AMPUTATION RIGHT GREAT TOE;  Surgeon: Caprice Beaver, DPM;  Location: [facility];  Service: Podiatry;  Laterality: Right;  . APPENDECTOMY    . BIOPSY  09/22/2020   Procedure: BIOPSY;  Surgeon: Eloise Harman, DO;  Location: [facility];  Service: Endoscopy;;  . COLONOSCOPY WITH PROPOFOL N/A 09/22/2020   non-bleeding internal  hemorrhoids, sigmoid and descending colon diverticulosis, localized mild inflammation in sigmoid that was felt due to prep artifact.   . ESOPHAGOGASTRODUODENOSCOPY N/A 12/09/2014   XAJ:OINOMV esophageal stricture/mild-to-noderate erosive gastritis. negative H.pylori  . FLEXIBLE SIGMOIDOSCOPY  2011   Dr. [name]: hyperplastic polyp  . LUNG BIOPSY Right 07/2014   CT guided  . MALONEY DILATION N/A 12/09/2014   Procedure: [name] DILATION;  Surgeon: [name], MD;  Location: [facility];  Service: Endoscopy;  Laterality: N/A;  . PORTACATH PLACEMENT Left 09/01/2014  . SAVORY DILATION N/A 12/09/2014   Procedure: SAVORY DILATION;  Surgeon: [name], MD;  Location: [facility];  Service: Endoscopy;  Laterality: N/A;    Current Outpatient Medications  Medication Sig Dispense Refill  . albuterol (VENTOLIN HFA) 108 (90 Base) MCG/ACT inhaler Inhale 2 puffs into the lungs every 6 (six) hours as needed for wheezing or shortness of breath.     . dronabinol (MARINOL) 2.5 MG capsule Take 1 capsule (2.5 mg total) by mouth 2 (two) times daily before lunch and supper. 60 capsule 2  . ENSURE (ENSURE) Take 1 Can by mouth 4 (four) times daily.    . Ferrous Sulfate (IRON) 325 (65 Fe) MG TABS Take 325 mg by mouth daily.     . Fluticasone-Salmeterol (ADVAIR DISKUS) 500-50 MCG/DOSE AEPB Inhale 1 puff into the lungs 2 (two) times daily. 60 each 6  . ibuprofen (ADVIL,MOTRIN) 200 MG tablet Take 400 mg by mouth every 8 (eight) hours  as needed (for pain.).     Marland Kitchen levocetirizine (XYZAL) 5 MG tablet Take 5 mg by mouth every evening.     Marland Kitchen levothyroxine (SYNTHROID) 25 MCG tablet TAKE 1/2 (ONE-HALF) TABLET BY MOUTH ONCE DAILY BEFORE BREAKFAST 45 tablet 0  . lidocaine-prilocaine (EMLA) cream Apply 1 application topically as needed (port access).    . Nivolumab (OPDIVO IV) Inject into the vein every 28 (twenty-eight) days.    . Omeprazole 20 MG TBDD Take by mouth daily.    . Oxycodone HCl 10 MG TABS Take 1 tablet (10  mg total) by mouth every 12 (twelve) hours as needed (pain.). 60 tablet 0  . pantoprazole (PROTONIX) 40 MG tablet Take 1 tablet (40 mg total) by mouth daily. 30 minutes before breakfast 90 tablet 3  . pseudoephedrine-guaifenesin (MUCINEX D) 60-600 MG 12 hr tablet Take 1 tablet by mouth daily as needed for congestion.    . rosuvastatin (CRESTOR) 20 MG tablet Take 20 mg by mouth daily.     No current facility-administered medications for this visit.   Facility-Administered Medications Ordered in Other Visits  Medication Dose Route Frequency Provider Last Rate Last Admin  . sodium chloride flush (NS) 0.9 % injection 10 mL  10 mL Intracatheter PRN Derek Jack, MD   10 mL at 01/03/19 0930    Allergies as of 03/09/2021 - Review Complete 03/09/2021  Allergen Reaction Noted  . Xgeva [denosumab]  08/15/2018  . Omeprazole  02/20/2020    Family History  Problem Relation Age of Onset  . Cancer Sister   . Colon cancer Neg Hx     Social History   Socioeconomic History  . Marital status: Married    Spouse name: Not on file  . Number of children: Not on file  . Years of education: Not on file  . Highest education level: Not on file  Occupational History  . Not on file  Tobacco Use  . Smoking status: Light Tobacco Smoker    Packs/day: 0.30    Years: 20.00    Pack years: 6.00    Types: Cigarettes  . Smokeless tobacco: Never Used  Vaping Use  . Vaping Use: Never used  Substance and Sexual Activity  . Alcohol use: No  . Drug use: No  . Sexual activity: Not on file  Other Topics Concern  . Not on file  Social History Narrative  . Not on file   Social Determinants of Health   Financial Resource Strain: Low Risk   . Difficulty of Paying Living Expenses: Not hard at all  Food Insecurity: No Food Insecurity  . Worried About Charity fundraiser in the Last Year: Never true  . Ran Out of Food in the Last Year: Never true  Transportation Needs: No Transportation Needs  . Lack  of Transportation (Medical): No  . Lack of Transportation (Non-Medical): No  Physical Activity: Not on file  Stress: No Stress Concern Present  . Feeling of Stress : Only a little  Social Connections: Moderately Isolated  . Frequency of Communication with Friends and Family: More than three times a week  . Frequency of Social Gatherings with Friends and Family: More than three times a week  . Attends Religious Services: Never  . Active Member of Clubs or Organizations: No  . Attends Archivist Meetings: Never  . Marital Status: Married    Review of Systems: Gen: Denies fever, chills, anorexia. Denies fatigue, weakness, weight loss.  CV: Denies chest pain, palpitations, syncope,  peripheral edema, and claudication. Resp: Denies dyspnea at rest, cough, wheezing, coughing up blood, and pleurisy. GI: see HPI Derm: Denies rash, itching, dry skin Psych: Denies depression, anxiety, memory loss, confusion. No homicidal or suicidal ideation.  Heme: Denies bruising, bleeding, and enlarged lymph nodes.  Physical Exam: BP 119/71   Pulse 89   Temp (!) 97.3 F (36.3 C)   Ht 4\' 11"  (1.499 m)   Wt 103 lb (46.7 kg)   BMI 20.80 kg/m  General:   Alert and oriented. No distress noted. Pleasant and cooperative.  Head:  Normocephalic and atraumatic. Eyes:  Conjuctiva clear without scleral icterus. Mouth:  Mask in place Abdomen:  +BS, soft, non-tender and non-distended. No rebound or guarding. No HSM or masses noted. Msk:  Symmetrical without gross deformities. Normal posture. Extremities:  Without edema. Neurologic:  Alert and  oriented x4 Psych:  Alert and cooperative. Normal mood and affect.  ASSESSMENT: Melissa Sullivan is a 65 y.o. female presenting today with a history of stage IV lung adenocarcinoma to the bones and right adrenal, chronic GERD, following up after recent colonoscopy.   Dexilant was not covered ,and omeprazole caused leg pain. We will trial pantoprazole (Protonix)  once daily. I have sent in this prescription. Otherwise, she is stable from a GI standpoint.    PLAN:  Protonix daily Return in 1 year or sooner if needed   Annitta Needs, PhD, ANP-BC [facility] Gastroenterology

## 2021-03-09 ENCOUNTER — Ambulatory Visit (INDEPENDENT_AMBULATORY_CARE_PROVIDER_SITE_OTHER): Payer: Medicare HMO | Admitting: Gastroenterology

## 2021-03-09 ENCOUNTER — Other Ambulatory Visit: Payer: Self-pay

## 2021-03-09 ENCOUNTER — Encounter: Payer: Self-pay | Admitting: Gastroenterology

## 2021-03-09 VITALS — BP 119/71 | HR 89 | Temp 97.3°F | Ht 59.0 in | Wt 103.0 lb

## 2021-03-09 DIAGNOSIS — K219 Gastro-esophageal reflux disease without esophagitis: Secondary | ICD-10-CM

## 2021-03-09 MED ORDER — PANTOPRAZOLE SODIUM 40 MG PO TBEC: 40.0000 mg | DELAYED_RELEASE_TABLET | Freq: Every day | ORAL | 3 refills | Status: DC

## 2021-03-09 NOTE — Patient Instructions (Signed)
Let's stop omeprazole. I have sent in pantoprazole (Protonix) to take once each morning, 30 minutes before breakfast. Please let me know if any issues with this!  We will see you back in 1 year or sooner if needed!  I enjoyed seeing you again today! As you know, I value our relationship and want to provide genuine, compassionate, and quality care. I welcome your feedback. If you receive a survey regarding your visit,  I greatly appreciate you taking time to fill this out. See you next time!  Annitta Needs, PhD, ANP-BC [facility] Gastroenterology

## 2021-03-18 ENCOUNTER — Telehealth: Payer: Self-pay | Admitting: Internal Medicine

## 2021-03-18 NOTE — Telephone Encounter (Signed)
[phone] patient called and said that the pantoprozole worked at first but now she is constipated and has gas.

## 2021-03-18 NOTE — Telephone Encounter (Signed)
Phoned and spoke with this pt and she started her Pantoprazole Monday morning and the same day later some nausea, lower abd bloating with a lot of gas. She advised that Tuesday the same but a little regurgitation with a little blood. Last night she ate some Activia and her bowel movement was sticky like. She's taking iron so her Bm's are a little darker. States she has a lot of indigestion as well. Please advise

## 2021-03-18 NOTE — Telephone Encounter (Signed)
Error

## 2021-03-19 DIAGNOSIS — G629 Polyneuropathy, unspecified: Secondary | ICD-10-CM | POA: Diagnosis not present

## 2021-03-19 DIAGNOSIS — B351 Tinea unguium: Secondary | ICD-10-CM | POA: Diagnosis not present

## 2021-03-19 NOTE — Telephone Encounter (Signed)
Attempted to call patient. Had to leave message.   Let's check on patient first thing Monday morning.

## 2021-03-21 DIAGNOSIS — I10 Essential (primary) hypertension: Secondary | ICD-10-CM | POA: Diagnosis not present

## 2021-03-21 DIAGNOSIS — D649 Anemia, unspecified: Secondary | ICD-10-CM | POA: Diagnosis not present

## 2021-03-22 ENCOUNTER — Telehealth: Payer: Self-pay | Admitting: Internal Medicine

## 2021-03-22 NOTE — Telephone Encounter (Signed)
Already handled by Vicente Males @ 1:19 pm

## 2021-03-22 NOTE — Telephone Encounter (Signed)
[phone]  PATIENT DAUGHTER TANISHA CALLED AND SAID PATIENT WAS VERY BLOATED AND WANTED TO KNOW IF SOMETHING COULD BE CALLED IN

## 2021-03-22 NOTE — Telephone Encounter (Signed)
Spoke with patient's daughter. Bloating started after starting pantoprazole. I have asked her to stop this and instead try Pepcid AC once to twice daily. They will call with updates.

## 2021-03-22 NOTE — Telephone Encounter (Signed)
Phoned and spoke with the pt was advised by her she is better this morning. Took her meds and ate breakfast. Still have some bloating but nothing else. States she did get your message and if things get worse she will call you on the number you left.

## 2021-03-25 ENCOUNTER — Other Ambulatory Visit: Payer: Self-pay

## 2021-03-25 ENCOUNTER — Inpatient Hospital Stay (HOSPITAL_COMMUNITY): Payer: Medicare HMO

## 2021-03-25 ENCOUNTER — Inpatient Hospital Stay (HOSPITAL_COMMUNITY): Payer: Medicare HMO | Attending: Hematology

## 2021-03-25 VITALS — BP 119/63 | HR 82 | Temp 97.0°F | Resp 18 | Wt 104.8 lb

## 2021-03-25 DIAGNOSIS — C7951 Secondary malignant neoplasm of bone: Secondary | ICD-10-CM | POA: Insufficient documentation

## 2021-03-25 DIAGNOSIS — C7971 Secondary malignant neoplasm of right adrenal gland: Secondary | ICD-10-CM | POA: Insufficient documentation

## 2021-03-25 DIAGNOSIS — C3411 Malignant neoplasm of upper lobe, right bronchus or lung: Secondary | ICD-10-CM | POA: Insufficient documentation

## 2021-03-25 DIAGNOSIS — Z5112 Encounter for antineoplastic immunotherapy: Secondary | ICD-10-CM | POA: Insufficient documentation

## 2021-03-25 DIAGNOSIS — F17210 Nicotine dependence, cigarettes, uncomplicated: Secondary | ICD-10-CM | POA: Diagnosis not present

## 2021-03-25 DIAGNOSIS — Z9221 Personal history of antineoplastic chemotherapy: Secondary | ICD-10-CM | POA: Insufficient documentation

## 2021-03-25 DIAGNOSIS — Z79899 Other long term (current) drug therapy: Secondary | ICD-10-CM | POA: Diagnosis not present

## 2021-03-25 DIAGNOSIS — Z923 Personal history of irradiation: Secondary | ICD-10-CM | POA: Diagnosis not present

## 2021-03-25 DIAGNOSIS — R69 Illness, unspecified: Secondary | ICD-10-CM | POA: Diagnosis not present

## 2021-03-25 DIAGNOSIS — E278 Other specified disorders of adrenal gland: Secondary | ICD-10-CM

## 2021-03-25 LAB — CBC WITH DIFFERENTIAL/PLATELET
Abs Immature Granulocytes: 0.03 10*3/uL (ref 0.00–0.07)
Basophils Absolute: 0 10*3/uL (ref 0.0–0.1)
Basophils Relative: 0 %
Eosinophils Absolute: 0.2 10*3/uL (ref 0.0–0.5)
Eosinophils Relative: 2 %
HCT: 42.4 % (ref 36.0–46.0)
Hemoglobin: 13.7 g/dL (ref 12.0–15.0)
Immature Granulocytes: 0 %
Lymphocytes Relative: 25 %
Lymphs Abs: 2.3 10*3/uL (ref 0.7–4.0)
MCH: 30.9 pg (ref 26.0–34.0)
MCHC: 32.3 g/dL (ref 30.0–36.0)
MCV: 95.5 fL (ref 80.0–100.0)
Monocytes Absolute: 0.7 10*3/uL (ref 0.1–1.0)
Monocytes Relative: 8 %
Neutro Abs: 6 10*3/uL (ref 1.7–7.7)
Neutrophils Relative %: 65 %
Platelets: 275 10*3/uL (ref 150–400)
RBC: 4.44 MIL/uL (ref 3.87–5.11)
RDW: 15.4 % (ref 11.5–15.5)
WBC: 9.2 10*3/uL (ref 4.0–10.5)
nRBC: 0 % (ref 0.0–0.2)

## 2021-03-25 LAB — COMPREHENSIVE METABOLIC PANEL
ALT: 15 U/L (ref 0–44)
AST: 18 U/L (ref 15–41)
Albumin: 4 g/dL (ref 3.5–5.0)
Alkaline Phosphatase: 65 U/L (ref 38–126)
Anion gap: 6 (ref 5–15)
BUN: 13 mg/dL (ref 8–23)
CO2: 28 mmol/L (ref 22–32)
Calcium: 9.7 mg/dL (ref 8.9–10.3)
Chloride: 105 mmol/L (ref 98–111)
Creatinine, Ser: 1.34 mg/dL — ABNORMAL HIGH (ref 0.44–1.00)
GFR, Estimated: 44 mL/min — ABNORMAL LOW (ref >60)
Glucose, Bld: 74 mg/dL (ref 70–99)
Potassium: 3.9 mmol/L (ref 3.5–5.1)
Sodium: 139 mmol/L (ref 135–145)
Total Bilirubin: 0.5 mg/dL (ref 0.3–1.2)
Total Protein: 7.6 g/dL (ref 6.5–8.1)

## 2021-03-25 LAB — TSH: TSH: 1.559 u[IU]/mL (ref 0.350–4.500)

## 2021-03-25 MED ORDER — NIVOLUMAB CHEMO INJECTION 240 MG/24ML
480.0000 mg | Freq: Once | INTRAVENOUS | Status: AC
  Administered 2021-03-25: 480 mg via INTRAVENOUS
  Filled 2021-03-25: qty 48

## 2021-03-25 MED ORDER — SODIUM CHLORIDE 0.9 % IV SOLN: Freq: Once | INTRAVENOUS | Status: AC

## 2021-03-25 MED ORDER — SODIUM CHLORIDE 0.9% FLUSH
10.0000 mL | INTRAVENOUS | Status: DC | PRN
  Administered 2021-03-25: 10 mL

## 2021-03-25 MED ORDER — HEPARIN SOD (PORK) LOCK FLUSH 100 UNIT/ML IV SOLN
500.0000 [IU] | Freq: Once | INTRAVENOUS | Status: AC | PRN
  Administered 2021-03-25: 500 [IU]

## 2021-03-25 NOTE — Patient Instructions (Signed)
[facility]  Discharge Instructions: Thank you for choosing [facility] to provide your oncology and hematology care.  If you have a lab appointment with the Cancer Center, please come in thru the Main Entrance and check in at the main information desk.  Wear comfortable clothing and clothing appropriate for easy access to any Portacath or PICC line.   We strive to give you quality time with your provider. You may need to reschedule your appointment if you arrive late (15 or more minutes).  Arriving late affects you and other patients whose appointments are after yours.  Also, if you miss three or more appointments without notifying the office, you may be dismissed from the clinic at the provider's discretion.      For prescription refill requests, have your pharmacy contact our office and allow 72 hours for refills to be completed.        To help prevent nausea and vomiting after your treatment, we encourage you to take your nausea medication as directed.  BELOW ARE SYMPTOMS THAT SHOULD BE REPORTED IMMEDIATELY: *FEVER GREATER THAN 100.4 F (38 C) OR HIGHER *CHILLS OR SWEATING *NAUSEA AND VOMITING THAT IS NOT CONTROLLED WITH YOUR NAUSEA MEDICATION *UNUSUAL SHORTNESS OF BREATH *UNUSUAL BRUISING OR BLEEDING *URINARY PROBLEMS (pain or burning when urinating, or frequent urination) *BOWEL PROBLEMS (unusual diarrhea, constipation, pain near the anus) TENDERNESS IN MOUTH AND THROAT WITH OR WITHOUT PRESENCE OF ULCERS (sore throat, sores in mouth, or a toothache) UNUSUAL RASH, SWELLING OR PAIN  UNUSUAL VAGINAL DISCHARGE OR ITCHING   Items with * indicate a potential emergency and should be followed up as soon as possible or go to the Emergency Department if any problems should occur.  Please show the CHEMOTHERAPY ALERT CARD or IMMUNOTHERAPY ALERT CARD at check-in to the Emergency Department and triage nurse.  Should you have questions after your visit or need to cancel  or reschedule your appointment, please contact [facility] [phone]  and follow the prompts.  Office hours are 8:00 a.m. to 4:30 p.m. Monday - Friday. Please note that voicemails left after 4:00 p.m. may not be returned until the following business day.  We are closed weekends and major holidays. You have access to a nurse at all times for urgent questions. Please call the main number to the clinic [phone] and follow the prompts.  For any non-urgent questions, you may also contact your provider using MyChart. We now offer e-Visits for anyone 18 and older to request care online for non-urgent symptoms. For details visit [email or web address].   Also download the MyChart app! Go to the app store, search "MyChart", open the app, select [facility], and log in with your MyChart username and password.  Due to Covid, a mask is required upon entering the hospital/clinic. If you do not have a mask, one will be given to you upon arrival. For doctor visits, patients may have 1 support person aged 18 or older with them. For treatment visits, patients cannot have anyone with them due to current Covid guidelines and our immunocompromised population.  

## 2021-03-25 NOTE — Progress Notes (Signed)
Treatment given per orders. Patient tolerated it well without problems. Vitals stable and discharged home from clinic ambulatory. Follow up as scheduled.  

## 2021-04-14 DIAGNOSIS — D509 Iron deficiency anemia, unspecified: Secondary | ICD-10-CM | POA: Diagnosis not present

## 2021-04-14 DIAGNOSIS — R7301 Impaired fasting glucose: Secondary | ICD-10-CM | POA: Diagnosis not present

## 2021-04-14 DIAGNOSIS — E039 Hypothyroidism, unspecified: Secondary | ICD-10-CM | POA: Diagnosis not present

## 2021-04-14 DIAGNOSIS — E782 Mixed hyperlipidemia: Secondary | ICD-10-CM | POA: Diagnosis not present

## 2021-04-21 DIAGNOSIS — J4520 Mild intermittent asthma, uncomplicated: Secondary | ICD-10-CM | POA: Diagnosis not present

## 2021-04-21 DIAGNOSIS — C3490 Malignant neoplasm of unspecified part of unspecified bronchus or lung: Secondary | ICD-10-CM | POA: Diagnosis not present

## 2021-04-21 DIAGNOSIS — E782 Mixed hyperlipidemia: Secondary | ICD-10-CM | POA: Diagnosis not present

## 2021-04-21 DIAGNOSIS — N1831 Chronic kidney disease, stage 3a: Secondary | ICD-10-CM | POA: Diagnosis not present

## 2021-04-21 DIAGNOSIS — D509 Iron deficiency anemia, unspecified: Secondary | ICD-10-CM | POA: Diagnosis not present

## 2021-04-21 DIAGNOSIS — I700 Atherosclerosis of aorta: Secondary | ICD-10-CM | POA: Diagnosis not present

## 2021-04-21 DIAGNOSIS — R946 Abnormal results of thyroid function studies: Secondary | ICD-10-CM | POA: Diagnosis not present

## 2021-04-21 DIAGNOSIS — K219 Gastro-esophageal reflux disease without esophagitis: Secondary | ICD-10-CM | POA: Diagnosis not present

## 2021-04-21 DIAGNOSIS — R7301 Impaired fasting glucose: Secondary | ICD-10-CM | POA: Diagnosis not present

## 2021-04-22 ENCOUNTER — Inpatient Hospital Stay (HOSPITAL_COMMUNITY): Payer: Medicare HMO | Attending: Hematology

## 2021-04-22 ENCOUNTER — Inpatient Hospital Stay (HOSPITAL_COMMUNITY): Payer: Medicare HMO

## 2021-04-22 ENCOUNTER — Other Ambulatory Visit: Payer: Self-pay

## 2021-04-22 VITALS — BP 120/67 | HR 78 | Temp 98.0°F | Resp 18 | Wt 101.8 lb

## 2021-04-22 DIAGNOSIS — E278 Other specified disorders of adrenal gland: Secondary | ICD-10-CM

## 2021-04-22 DIAGNOSIS — C7971 Secondary malignant neoplasm of right adrenal gland: Secondary | ICD-10-CM | POA: Insufficient documentation

## 2021-04-22 DIAGNOSIS — Z9221 Personal history of antineoplastic chemotherapy: Secondary | ICD-10-CM | POA: Insufficient documentation

## 2021-04-22 DIAGNOSIS — Z923 Personal history of irradiation: Secondary | ICD-10-CM | POA: Insufficient documentation

## 2021-04-22 DIAGNOSIS — C3411 Malignant neoplasm of upper lobe, right bronchus or lung: Secondary | ICD-10-CM | POA: Diagnosis not present

## 2021-04-22 DIAGNOSIS — F17210 Nicotine dependence, cigarettes, uncomplicated: Secondary | ICD-10-CM | POA: Diagnosis not present

## 2021-04-22 DIAGNOSIS — Z79899 Other long term (current) drug therapy: Secondary | ICD-10-CM | POA: Insufficient documentation

## 2021-04-22 DIAGNOSIS — Z5112 Encounter for antineoplastic immunotherapy: Secondary | ICD-10-CM | POA: Insufficient documentation

## 2021-04-22 DIAGNOSIS — R69 Illness, unspecified: Secondary | ICD-10-CM | POA: Diagnosis not present

## 2021-04-22 DIAGNOSIS — C7951 Secondary malignant neoplasm of bone: Secondary | ICD-10-CM | POA: Diagnosis not present

## 2021-04-22 LAB — CBC WITH DIFFERENTIAL/PLATELET
Abs Immature Granulocytes: 0.02 10*3/uL (ref 0.00–0.07)
Basophils Absolute: 0 10*3/uL (ref 0.0–0.1)
Basophils Relative: 1 %
Eosinophils Absolute: 0.2 10*3/uL (ref 0.0–0.5)
Eosinophils Relative: 3 %
HCT: 44.1 % (ref 36.0–46.0)
Hemoglobin: 14.2 g/dL (ref 12.0–15.0)
Immature Granulocytes: 0 %
Lymphocytes Relative: 31 %
Lymphs Abs: 2.5 10*3/uL (ref 0.7–4.0)
MCH: 30.5 pg (ref 26.0–34.0)
MCHC: 32.2 g/dL (ref 30.0–36.0)
MCV: 94.8 fL (ref 80.0–100.0)
Monocytes Absolute: 0.6 10*3/uL (ref 0.1–1.0)
Monocytes Relative: 7 %
Neutro Abs: 4.6 10*3/uL (ref 1.7–7.7)
Neutrophils Relative %: 58 %
Platelets: 294 10*3/uL (ref 150–400)
RBC: 4.65 MIL/uL (ref 3.87–5.11)
RDW: 15.4 % (ref 11.5–15.5)
WBC: 8 10*3/uL (ref 4.0–10.5)
nRBC: 0 % (ref 0.0–0.2)

## 2021-04-22 LAB — COMPREHENSIVE METABOLIC PANEL
ALT: 15 U/L (ref 0–44)
AST: 21 U/L (ref 15–41)
Albumin: 4.2 g/dL (ref 3.5–5.0)
Alkaline Phosphatase: 64 U/L (ref 38–126)
Anion gap: 8 (ref 5–15)
BUN: 12 mg/dL (ref 8–23)
CO2: 27 mmol/L (ref 22–32)
Calcium: 9.7 mg/dL (ref 8.9–10.3)
Chloride: 102 mmol/L (ref 98–111)
Creatinine, Ser: 1.35 mg/dL — ABNORMAL HIGH (ref 0.44–1.00)
GFR, Estimated: 44 mL/min — ABNORMAL LOW (ref >60)
Glucose, Bld: 110 mg/dL — ABNORMAL HIGH (ref 70–99)
Potassium: 3.7 mmol/L (ref 3.5–5.1)
Sodium: 137 mmol/L (ref 135–145)
Total Bilirubin: 0.6 mg/dL (ref 0.3–1.2)
Total Protein: 7.8 g/dL (ref 6.5–8.1)

## 2021-04-22 LAB — TSH: TSH: 2.022 u[IU]/mL (ref 0.350–4.500)

## 2021-04-22 MED ORDER — SODIUM CHLORIDE 0.9 % IV SOLN
480.0000 mg | Freq: Once | INTRAVENOUS | Status: AC
  Administered 2021-04-22: 480 mg via INTRAVENOUS
  Filled 2021-04-22: qty 48

## 2021-04-22 MED ORDER — SODIUM CHLORIDE 0.9% FLUSH
10.0000 mL | INTRAVENOUS | Status: DC | PRN
  Administered 2021-04-22: 10 mL

## 2021-04-22 MED ORDER — HEPARIN SOD (PORK) LOCK FLUSH 100 UNIT/ML IV SOLN
500.0000 [IU] | Freq: Once | INTRAVENOUS | Status: AC | PRN
  Administered 2021-04-22: 500 [IU]

## 2021-04-22 MED ORDER — SODIUM CHLORIDE 0.9 % IV SOLN
Freq: Once | INTRAVENOUS | Status: AC
Start: 2021-04-22 — End: 2021-04-22

## 2021-04-23 ENCOUNTER — Encounter (HOSPITAL_COMMUNITY): Payer: Self-pay | Admitting: Hematology and Oncology

## 2021-04-23 NOTE — Progress Notes (Signed)
Late entry-   Treatment given per orders. Patient tolerated it well without problems. Vitals stable and discharged home from clinic ambulatory. Follow up as scheduled.

## 2021-04-23 NOTE — Patient Instructions (Signed)
[facility]  Discharge Instructions: Thank you for choosing [facility] to provide your oncology and hematology care.  If you have a lab appointment with the [facility], please come in thru the Main Entrance and check in at the main information desk.  Wear comfortable clothing and clothing appropriate for easy access to any Portacath or PICC line.   We strive to give you quality time with your provider. You may need to reschedule your appointment if you arrive late (15 or more minutes).  Arriving late affects you and other patients whose appointments are after yours.  Also, if you miss three or more appointments without notifying the office, you may be dismissed from the clinic at the provider's discretion.      For prescription refill requests, have your pharmacy contact our office and allow 72 hours for refills to be completed.    Today you received the following  immunotherapy agents    To help prevent nausea and vomiting after your treatment, we encourage you to take your nausea medication as directed.  BELOW ARE SYMPTOMS THAT SHOULD BE REPORTED IMMEDIATELY: . *FEVER GREATER THAN 100.4 F (38 C) OR HIGHER . *CHILLS OR SWEATING . *NAUSEA AND VOMITING THAT IS NOT CONTROLLED WITH YOUR NAUSEA MEDICATION . *UNUSUAL SHORTNESS OF BREATH . *UNUSUAL BRUISING OR BLEEDING . *URINARY PROBLEMS (pain or burning when urinating, or frequent urination) . *BOWEL PROBLEMS (unusual diarrhea, constipation, pain near the anus) . TENDERNESS IN MOUTH AND THROAT WITH OR WITHOUT PRESENCE OF ULCERS (sore throat, sores in mouth, or a toothache) . UNUSUAL RASH, SWELLING OR PAIN  . UNUSUAL VAGINAL DISCHARGE OR ITCHING   Items with * indicate a potential emergency and should be followed up as soon as possible or go to the Emergency Department if any problems should occur.  Please show the CHEMOTHERAPY ALERT CARD or IMMUNOTHERAPY ALERT CARD at check-in to the Emergency Department and  triage nurse.  Should you have questions after your visit or need to cancel or reschedule your appointment, please contact [facility] [phone]  and follow the prompts.  Office hours are 8:00 a.m. to 4:30 p.m. Monday - Friday. Please note that voicemails left after 4:00 p.m. may not be returned until the following business day.  We are closed weekends and major holidays. You have access to a nurse at all times for urgent questions. Please call the main number to the clinic [phone] and follow the prompts.  For any non-urgent questions, you may also contact your provider using MyChart. We now offer e-Visits for anyone 60 and older to request care online for non-urgent symptoms. For details visit [email or web address].   Also download the MyChart app! Go to the app store, search "MyChart", open the app, select [facility], and log in with your MyChart username and password.  Due to Covid, a mask is required upon entering the hospital/clinic. If you do not have a mask, one will be given to you upon arrival. For doctor visits, patients may have 1 support person aged 68 or older with them. For treatment visits, patients cannot have anyone with them due to current Covid guidelines and our immunocompromised population.

## 2021-05-13 ENCOUNTER — Telehealth: Payer: Self-pay | Admitting: Internal Medicine

## 2021-05-13 NOTE — Telephone Encounter (Signed)
Lmom for pt to call back.

## 2021-05-13 NOTE — Telephone Encounter (Signed)
PLEASE CALL PATIENT, SHE STATES THAT HER REFLUX MEDICATION IS NOT WORKING AND IT IS ALSO TAKING AWAY HER APPETITE [phone]

## 2021-05-13 NOTE — Telephone Encounter (Signed)
Recommend taking Pepcid 20 mg every evening at bedtime. Try to eat 4-6 small meals a day.  Continue to limit fried, fatty, greasy, spicy, citrus foods.  Avoid caffeine and carbonated beverages.  Continue to sleep propped up and avoid eating within 3 hours of laying down.  Call back with any ongoing problems.

## 2021-05-13 NOTE — Telephone Encounter (Signed)
Pt having poor appetite after breakfast.  Takes Pantoprazole daily in the morning.  Takes Pepcid as needed.  Has heartburn almost every night after 9:00.  Symptoms going on 2 to 3 wks.  No n/v.  Eats mostly broccoli and cheese, baked potato, chicken, and fish.  Sleeps with head propped up and does not eat 3 hours before laying down.

## 2021-05-14 NOTE — Telephone Encounter (Signed)
Pt was made aware of recommendations.  Pt advised to take Pepcid 20 mg every evening at bedtime.  Pt informed to call us with any ongoing problems.

## 2021-05-19 DIAGNOSIS — D649 Anemia, unspecified: Secondary | ICD-10-CM | POA: Diagnosis not present

## 2021-05-19 DIAGNOSIS — K219 Gastro-esophageal reflux disease without esophagitis: Secondary | ICD-10-CM | POA: Diagnosis not present

## 2021-05-20 ENCOUNTER — Inpatient Hospital Stay (HOSPITAL_COMMUNITY): Payer: Medicare HMO

## 2021-05-20 ENCOUNTER — Other Ambulatory Visit: Payer: Self-pay

## 2021-05-20 ENCOUNTER — Inpatient Hospital Stay (HOSPITAL_BASED_OUTPATIENT_CLINIC_OR_DEPARTMENT_OTHER): Payer: Medicare HMO | Admitting: Hematology

## 2021-05-20 VITALS — BP 118/60 | HR 76 | Temp 96.9°F | Resp 18

## 2021-05-20 DIAGNOSIS — Z79899 Other long term (current) drug therapy: Secondary | ICD-10-CM | POA: Diagnosis not present

## 2021-05-20 DIAGNOSIS — R69 Illness, unspecified: Secondary | ICD-10-CM | POA: Diagnosis not present

## 2021-05-20 DIAGNOSIS — Z5112 Encounter for antineoplastic immunotherapy: Secondary | ICD-10-CM | POA: Diagnosis not present

## 2021-05-20 DIAGNOSIS — E278 Other specified disorders of adrenal gland: Secondary | ICD-10-CM

## 2021-05-20 DIAGNOSIS — C3411 Malignant neoplasm of upper lobe, right bronchus or lung: Secondary | ICD-10-CM

## 2021-05-20 DIAGNOSIS — Z9221 Personal history of antineoplastic chemotherapy: Secondary | ICD-10-CM | POA: Diagnosis not present

## 2021-05-20 DIAGNOSIS — C7971 Secondary malignant neoplasm of right adrenal gland: Secondary | ICD-10-CM | POA: Diagnosis not present

## 2021-05-20 DIAGNOSIS — C7951 Secondary malignant neoplasm of bone: Secondary | ICD-10-CM | POA: Diagnosis not present

## 2021-05-20 DIAGNOSIS — Z923 Personal history of irradiation: Secondary | ICD-10-CM | POA: Diagnosis not present

## 2021-05-20 LAB — TSH: TSH: 1.758 u[IU]/mL (ref 0.350–4.500)

## 2021-05-20 LAB — COMPREHENSIVE METABOLIC PANEL
ALT: 16 U/L (ref 0–44)
AST: 19 U/L (ref 15–41)
Albumin: 4.1 g/dL (ref 3.5–5.0)
Alkaline Phosphatase: 67 U/L (ref 38–126)
Anion gap: 6 (ref 5–15)
BUN: 15 mg/dL (ref 8–23)
CO2: 26 mmol/L (ref 22–32)
Calcium: 10.1 mg/dL (ref 8.9–10.3)
Chloride: 105 mmol/L (ref 98–111)
Creatinine, Ser: 1.25 mg/dL — ABNORMAL HIGH (ref 0.44–1.00)
GFR, Estimated: 48 mL/min — ABNORMAL LOW (ref >60)
Glucose, Bld: 96 mg/dL (ref 70–99)
Potassium: 4.3 mmol/L (ref 3.5–5.1)
Sodium: 137 mmol/L (ref 135–145)
Total Bilirubin: 0.4 mg/dL (ref 0.3–1.2)
Total Protein: 7.3 g/dL (ref 6.5–8.1)

## 2021-05-20 LAB — CBC WITH DIFFERENTIAL/PLATELET
Abs Immature Granulocytes: 0.01 10*3/uL (ref 0.00–0.07)
Basophils Absolute: 0.1 10*3/uL (ref 0.0–0.1)
Basophils Relative: 1 %
Eosinophils Absolute: 0.2 10*3/uL (ref 0.0–0.5)
Eosinophils Relative: 2 %
HCT: 42.9 % (ref 36.0–46.0)
Hemoglobin: 14 g/dL (ref 12.0–15.0)
Immature Granulocytes: 0 %
Lymphocytes Relative: 33 %
Lymphs Abs: 2.4 10*3/uL (ref 0.7–4.0)
MCH: 30.8 pg (ref 26.0–34.0)
MCHC: 32.6 g/dL (ref 30.0–36.0)
MCV: 94.5 fL (ref 80.0–100.0)
Monocytes Absolute: 0.6 10*3/uL (ref 0.1–1.0)
Monocytes Relative: 8 %
Neutro Abs: 4.2 10*3/uL (ref 1.7–7.7)
Neutrophils Relative %: 56 %
Platelets: 297 10*3/uL (ref 150–400)
RBC: 4.54 MIL/uL (ref 3.87–5.11)
RDW: 15.1 % (ref 11.5–15.5)
WBC: 7.4 10*3/uL (ref 4.0–10.5)
nRBC: 0 % (ref 0.0–0.2)

## 2021-05-20 MED ORDER — SODIUM CHLORIDE 0.9% FLUSH
10.0000 mL | INTRAVENOUS | Status: DC | PRN
  Administered 2021-05-20: 10 mL

## 2021-05-20 MED ORDER — SODIUM CHLORIDE 0.9 % IV SOLN
480.0000 mg | Freq: Once | INTRAVENOUS | Status: AC
  Administered 2021-05-20: 480 mg via INTRAVENOUS
  Filled 2021-05-20: qty 48

## 2021-05-20 MED ORDER — SODIUM CHLORIDE 0.9 % IV SOLN: Freq: Once | INTRAVENOUS | Status: AC

## 2021-05-20 MED ORDER — HEPARIN SOD (PORK) LOCK FLUSH 100 UNIT/ML IV SOLN
500.0000 [IU] | Freq: Once | INTRAVENOUS | Status: AC | PRN
  Administered 2021-05-20: 500 [IU]

## 2021-05-20 MED ORDER — OXYCODONE HCL 10 MG PO TABS: 10.0000 mg | ORAL_TABLET | Freq: Two times a day (BID) | ORAL | 0 refills | Status: DC | PRN

## 2021-05-20 NOTE — Progress Notes (Signed)
Patient has been assessed, vital signs and labs have been reviewed by Dr. Delton Coombes. ANC, Creatinine, LFTs, and Platelets are within treatment parameters per Dr. Delton Coombes. Dr. Delton Coombes is aware of GFR  48 and creatinine level being 1.25.The patient is good to proceed with treatment at this time.  Primary RN and pharmacy aware.

## 2021-05-20 NOTE — Patient Instructions (Addendum)
[place] at [facility] Discharge Instructions  You were seen today by Dr. Delton Coombes. He reviewed your labs and kidney function is slightly elevated but stable, creatinine is better than last time. All other labs look good. Drink a lot of fluids and avoid NSAIDs like ibuprofen and naprosyn to help with kidney function.   You received your treatment today. Please follow up as scheduled.   Thank you for choosing [place] at [facility] to provide your oncology and hematology care.  To afford each patient quality time with our provider, please arrive at least 15 minutes before your scheduled appointment time.   If you have a lab appointment with the [facility] please come in thru the Main Entrance and check in at the main information desk.  You need to re-schedule your appointment should you arrive 10 or more minutes late.  We strive to give you quality time with our providers, and arriving late affects you and other patients whose appointments are after yours.  Also, if you no show three or more times for appointments you may be dismissed from the clinic at the providers discretion.     Again, thank you for choosing [facility]  Our hope is that these requests will decrease the amount of time that you wait before being seen by our physicians.       _____________________________________________________________  Should you have questions after your visit to [facility], please contact our office at [phone] and follow the prompts.  Our office hours are 8:00 a.m. and 4:30 p.m. Monday - Friday.  Please note that voicemails left after 4:00 p.m. may not be returned until the following business day.  We are closed weekends and major holidays.  You do have access to a nurse 24-7, just call the main number to the clinic [phone] and do not press any options, hold on the line and a nurse will answer the phone.    For  prescription refill requests, have your pharmacy contact our office and allow 72 hours.    Due to Covid, you will need to wear a mask upon entering the hospital. If you do not have a mask, a mask will be given to you at the Main Entrance upon arrival. For doctor visits, patients may have 1 support person age 23 or older with them. For treatment visits, patients can not have anyone with them due to social distancing guidelines and our immunocompromised population.

## 2021-05-20 NOTE — Progress Notes (Signed)
[facility] [address], [place] [address]   CLINIC:  Medical Oncology/Hematology  PCP:  [name], MD [address] [place] [address] [phone]   REASON FOR VISIT:  Follow-up for metastatic right lung cancer  PRIOR THERAPY:  1. Bevacizumab, cisplatin and pemetrexed x 4 cycles from 09/02/2014 to 11/04/2014. 2. Bevacizumab and pemetrexed x 3 cycles from 12/24/2014 to 02/04/2015.  NGS Results: Not done  CURRENT THERAPY: Nivolumab every 4 weeks  BRIEF ONCOLOGIC HISTORY:  Oncology History  Cancer of upper lobe of right lung ([place])  07/28/2014 Imaging   CT chest: Large R apical mass consistent with malignancy. This is destroying the R 2nd rib with extension into adjacent soft tissue. R hilar adenopathy with R 5cm adrenal metastatic lesion.   08/01/2014 Initial Biopsy   Lung, needle/core biopsy(ies), right upper lobe - POORLY DIFFERENTIATED ADENOCARCINOMA, SEE COMMENT.   08/08/2014 PET scan   Large hypermetabolic R apical mass with evidence of direct chest wall and mediastinal invasion, right retrocrural lymphadenopathy, extensive retroperitoneal lymphadenopathy, and metastatic lesions to the adrenal glands    09/02/2014 - 11/04/2014 Chemotherapy   Cisplatin/Pemetrexed/Avastin every 21 days x 4 cycles   10/07/2014 - 10/27/2014 Radiation Therapy   Right lung apex for control of brachioplexopathy.   12/24/2014 - 02/25/2015 Chemotherapy   Alimta/Avastin every 21 days.   02/20/2015 Imaging   Increase in size of right adrenal metastasis and subjacent confluent retrocaval lymphadenopathy   02/25/2015 -  Chemotherapy   Nivolumab, zometa   05/04/2015 Imaging   CT CAP- Stable to slight decrease in the posterior right apical lesion. Stable appearance of posterior right upper rib an upper thoracic bony lesions. Slight improvement in right upper lobe tree-in-bud opacity. No new or progressive findings in...   07/28/2015 Imaging   CT CAP- Reduced size of the  right apical pleural parenchymal lesion and reduced size of the right adrenal metastatic lesion. Resolution of prior retrocrural adenopathy.  Right eccentric T1 and T2 sclerosis with sclerosis and tapering of the right second..   11/17/2015 Imaging   CT CAP- Stable soft tissue thickening in the apex of the right hemi thorax. Stable right adrenal metastasis. Nodularity along the trachea and mainstem bronchi, relatively new from 07/28/2015, favoring adherent debris.   11/18/2015 Treatment Plan Change   Zometa HELD for upcoming tooth extraction   11/24/2015 Treatment Plan Change   Zometa on hold at this time in preparation for tooth extraction in March 2017.  Zometa las given on 11/18/2015.   02/03/2016 Imaging   CT CAP- Heterogeneous right apical masslike consolidation and right adrenal metastasis are unchanged   04/06/2016 Treatment Plan Change   Zometa restarted 6 weeks out from tooth extraction (04/06/2016)   05/12/2016 Imaging   CT CAP- NED in the chest, abdomen or pelvis.  Some areas of nodularity associated with the mainstem bronchi in the left upper lobe bronchus, favored to represent adherent inspissated secretions   08/17/2016 Imaging   CT CAP- 1. Stable CTs of the chest and abdomen. No evidence of progressive metastatic disease. 2. Probable treated tumor at the right apex, right adrenal gland and T2 vertebral body, stable. 3. Fluctuating nodularity along the walls of the trachea and mainstem bronchi, likely secretions.   12/12/2016 Imaging   Further decrease in size of treated tumor within the right apex. 2. Stable treated tumor involving the right second rib and T2 vertebra. 3. Stable right adrenal gland treated tumor. 4. Emphysema 5. Aortic atherosclerosis  01/04/2017 Imaging   MRI brain- Normal brain MRI.  No intracranial metastatic disease.   03/15/2017 Imaging   CT CAP- 1. No new or progressive metastatic disease in the chest or abdomen. 2. Stable treated tumor in the  apical right upper lobe. Stable treated right posterior second rib and right T2 vertebral lesions. Stable treated right adrenal metastasis. 3. Aortic atherosclerosis. 4. Moderate emphysema with mild diffuse bronchial wall thickening, suggesting COPD.   06/12/2017 Imaging   CT CAP 1. Similar appearance of treated primary within the right apex. 2. Similar areas of sclerosis within the right second rib, T2, and less so T1 vertebral bodies. These are most consistent with treated metastasis. 3. Similar right adrenal treated metastasis. 4. No evidence of new or progressive disease. 5. Similar right and progressive left areas of bronchial wall thickening and probable mucoid impaction. Correlate with interval infectious symptoms. 6.  Emphysema (ICD10-J43.9). 7. Coronary artery atherosclerosis. Aortic Atherosclerosis (ICD10-I70.0).     10/16/2017 Imaging   CT CAP 1. Stable appearance of the prior [place]ancoast tumor and related bony findings in the right second rib and right T1 and T2 vertebra compatible with successfully treated tumor. No significant enlargement or new lesions are identified. Similarly the treated right adrenal metastatic lesion is stable in appearance. 2. Upper normal size right hilar lymph node may warrant surveillance. Currently 9 mm in short axis. 3. Other imaging findings of potential clinical significance: Aortic Atherosclerosis (ICD10-I70.0) and Emphysema (ICD10-J43.9). Scattered proximal sigmoid colon diverticula. Mucus plugging medially in the left upper lobe and posteriorly in the right upper lobe.       CANCER STAGING: Cancer Staging No matching staging information was found for the patient.  INTERVAL HISTORY:  Melissa Sullivan, a 65 y.o. female, seen for follow-up of metastatic lung cancer.  She is receiving Durvalumab every month.  She reports that she is having problems with acid reflux.  Dr. Nevada Crane has put her back on her original medication few days  ago.  She reports that it is controlling her acid reflux better.  She is able to eat better.  She was losing 1 pound each month because of acid reflux.  She reports some shortness of breath on exertion which is stable.  Has some dry cough which is also stable.  Denies any arthralgias.  No diarrhea reported.    REVIEW OF SYSTEMS:  Review of Systems  Constitutional:  Positive for unexpected weight change (losing 1 lb every month). Negative for appetite change.  Respiratory:  Positive for shortness of breath.   Musculoskeletal:  Negative for arthralgias.  All other systems reviewed and are negative.  PAST MEDICAL/SURGICAL HISTORY:  Past Medical History:  Diagnosis Date   Adrenal mass, right ([place]) 07/28/2014   Anemia    Bone metastases ([place]) 04/05/2016   Cancer ([place]) 2015   lung  right   Diabetes mellitus without complication ([place])    GERD (gastroesophageal reflux disease)    Hyperlipidemia    Hypertension    Hypothyroidism due to medication 01/30/2017   Lung mass 07/28/2014   Reflux    Past Surgical History:  Procedure Laterality Date   AMPUTATION Right 02/22/2017   Procedure: PARTIAL AMPUTATION RIGHT GREAT TOE;  Surgeon: Caprice Beaver, DPM;  Location: [facility];  Service: Podiatry;  Laterality: Right;   APPENDECTOMY     BIOPSY  09/22/2020   Procedure: BIOPSY;  Surgeon: Eloise Harman, DO;  Location: [facility];  Service: Endoscopy;;   COLONOSCOPY WITH PROPOFOL N/A 09/22/2020  non-bleeding internal hemorrhoids, sigmoid and descending colon diverticulosis, localized mild inflammation in sigmoid that was felt due to prep artifact.    ESOPHAGOGASTRODUODENOSCOPY N/A 12/09/2014   VOZ:DGUYQI esophageal stricture/mild-to-noderate erosive gastritis. negative H.pylori   FLEXIBLE SIGMOIDOSCOPY  2011   Dr. [name]: hyperplastic polyp   LUNG BIOPSY Right 07/2014   CT guided   MALONEY DILATION N/A 12/09/2014   Procedure: MALONEY DILATION;  Surgeon: [name], MD;  Location: [facility];   Service: Endoscopy;  Laterality: N/A;   PORTACATH PLACEMENT Left 09/01/2014   SAVORY DILATION N/A 12/09/2014   Procedure: SAVORY DILATION;  Surgeon: [name], MD;  Location: [facility];  Service: Endoscopy;  Laterality: N/A;    SOCIAL HISTORY:  Social History   Socioeconomic History   Marital status: Married    Spouse name: Not on file   Number of children: Not on file   Years of education: Not on file   Highest education level: Not on file  Occupational History   Not on file  Tobacco Use   Smoking status: Light Smoker    Packs/day: 0.30    Years: 20.00    Pack years: 6.00    Types: Cigarettes   Smokeless tobacco: Never  Vaping Use   Vaping Use: Never used  Substance and Sexual Activity   Alcohol use: No   Drug use: No   Sexual activity: Not on file  Other Topics Concern   Not on file  Social History Narrative   Not on file   Social Determinants of Health   Financial Resource Strain: Low Risk    Difficulty of Paying Living Expenses: Not hard at all  Food Insecurity: No Food Insecurity   Worried About Charity fundraiser in the Last Year: Never true   Ran Out of Food in the Last Year: Never true  Transportation Needs: No Transportation Needs   Lack of Transportation (Medical): No   Lack of Transportation (Non-Medical): No  Physical Activity: Not on file  Stress: No Stress Concern Present   Feeling of Stress : Only a little  Social Connections: Moderately Isolated   Frequency of Communication with Friends and Family: More than three times a week   Frequency of Social Gatherings with Friends and Family: More than three times a week   Attends Religious Services: Never   Marine scientist or Organizations: No   Attends Music therapist: Never   Marital Status: Married  Human resources officer Violence: Not At Risk   Fear of Current or Ex-Partner: No   Emotionally Abused: No   Physically Abused: No   Sexually Abused: No    FAMILY HISTORY:   Family History  Problem Relation Age of Onset   Cancer Sister    Colon cancer Neg Hx     CURRENT MEDICATIONS:  Current Outpatient Medications  Medication Sig Dispense Refill   albuterol (VENTOLIN HFA) 108 (90 Base) MCG/ACT inhaler Inhale 2 puffs into the lungs every 6 (six) hours as needed for wheezing or shortness of breath.      dexlansoprazole (DEXILANT) 60 MG capsule Dexilant 60 mg capsule, delayed release  TAKE 1 CAPSULE BY MOUTH IN THE MORNING WITH BREAKFAST     dronabinol (MARINOL) 2.5 MG capsule Take 1 capsule (2.5 mg total) by mouth 2 (two) times daily before lunch and supper. 60 capsule 2   ENSURE (ENSURE) Take 1 Can by mouth 4 (four) times daily.     Ferrous Sulfate (IRON) 325 (65 Fe)  MG TABS Take 325 mg by mouth daily.      Fluticasone-Salmeterol (ADVAIR DISKUS) 500-50 MCG/DOSE AEPB Inhale 1 puff into the lungs 2 (two) times daily. 60 each 6   folic acid (FOLVITE) 1 MG tablet folic acid 1 mg tablet  Take 1 tablet every day by oral route.     ibuprofen (ADVIL,MOTRIN) 200 MG tablet Take 400 mg by mouth every 8 (eight) hours as needed (for pain.).      levocetirizine (XYZAL) 5 MG tablet Take 5 mg by mouth every evening.      levothyroxine (SYNTHROID) 25 MCG tablet TAKE 1/2 (ONE-HALF) TABLET BY MOUTH ONCE DAILY BEFORE BREAKFAST 45 tablet 0   lidocaine-prilocaine (EMLA) cream Apply 1 application topically as needed (port access).     Nivolumab (OPDIVO IV) Inject into the vein every 28 (twenty-eight) days.     omeprazole (PRILOSEC) 40 MG capsule omeprazole 40 mg capsule,delayed release  Take one capsule by mouth once daily about 30 minutes before breakfast     pantoprazole (PROTONIX) 40 MG tablet Take 1 tablet (40 mg total) by mouth daily. 30 minutes before breakfast 90 tablet 3   pseudoephedrine-guaifenesin (MUCINEX D) 60-600 MG 12 hr tablet Take 1 tablet by mouth daily as needed for congestion.     rosuvastatin (CRESTOR) 20 MG tablet Take 20 mg by mouth daily.     simvastatin  (ZOCOR) 40 MG tablet simvastatin 40 mg tablet  TAKE 1 TABLET BY MOUTH ONCE DAILY     Oxycodone HCl 10 MG TABS Take 1 tablet (10 mg total) by mouth every 12 (twelve) hours as needed (pain.). 60 tablet 0   No current facility-administered medications for this visit.   Facility-Administered Medications Ordered in Other Visits  Medication Dose Route Frequency Provider Last Rate Last Admin   sodium chloride flush (NS) 0.9 % injection 10 mL  10 mL Intracatheter PRN Derek Jack, MD   10 mL at 01/03/19 0930    ALLERGIES:  Allergies  Allergen Reactions   Xgeva [Denosumab]     osteonecrosis    PHYSICAL EXAM:  Performance status (ECOG): 0 - Asymptomatic  Vitals:   05/20/21 1000  BP: 116/71  Pulse: 96  Resp: 18  Temp: (!) 97 F (36.1 C)  SpO2: 99%   Wt Readings from Last 3 Encounters:  05/20/21 102 lb 14.4 oz (46.7 kg)  04/22/21 101 lb 12.8 oz (46.2 kg)  03/25/21 104 lb 12.8 oz (47.5 kg)   Physical Exam Vitals reviewed.  Constitutional:      Appearance: Normal appearance.  Chest:     Comments: Port-a-Cath in L chest Neurological:     Mental Status: She is alert and oriented to person, place, and time.  Psychiatric:        Mood and Affect: Mood normal.        Behavior: Behavior normal.    LABORATORY DATA:  I have reviewed the labs as listed.  CBC Latest Ref Rng & Units 05/20/2021 04/22/2021 03/25/2021  WBC 4.0 - 10.5 K/uL 7.4 8.0 9.2  Hemoglobin 12.0 - 15.0 g/dL 14.0 14.2 13.7  Hematocrit 36.0 - 46.0 % 42.9 44.1 42.4  Platelets 150 - 400 K/uL 297 294 275   CMP Latest Ref Rng & Units 05/20/2021 04/22/2021 03/25/2021  Glucose 70 - 99 mg/dL 96 110(H) 74  BUN 8 - 23 mg/dL 15 12 13   Creatinine 0.44 - 1.00 mg/dL 1.25(H) 1.35(H) 1.34(H)  Sodium 135 - 145 mmol/L 137 137 139  Potassium 3.5 - 5.1 mmol/L  4.3 3.7 3.9  Chloride 98 - 111 mmol/L 105 102 105  CO2 22 - 32 mmol/L 26 27 28   Calcium 8.9 - 10.3 mg/dL 10.1 9.7 9.7  Total Protein 6.5 - 8.1 g/dL 7.3 7.8 7.6  Total  Bilirubin 0.3 - 1.2 mg/dL 0.4 0.6 0.5  Alkaline Phos 38 - 126 U/L 67 64 65  AST 15 - 41 U/L 19 21 18   ALT 0 - 44 U/L 16 15 15     DIAGNOSTIC IMAGING:  I have independently reviewed the scans and discussed with the patient. No results found.    ASSESSMENT:  1. Cancer of upper lobe of right lung [facility]) Metastatic adenocarcinoma of the lung to the bones and right adrenal: -Foundation 1 testing shows 14 genomic alterations, no approved therapies. -Opdivo 480 mg monthly started on February 25, 2015. -CT CAP on February 21, 2020 shows no evidence of progression or metastatic disease.  T2 sclerotic lesion is stable. -CTAP on 08/10/2020 with no evidence of recurrent metastatic disease in the abdomen or pelvis.  Irregular mildly enlarged right adrenal gland unchanged. -CT chest on 09/09/2020 showed partial clearing of the airspace opacities noted medially in the lingula and left lower lobe.  New irregular nodule posteriorly at the right apex likely inflammatory.  Stable sclerotic T2 vertebral body lesion. -Reviewed CT CAP from 02/19/2021 which showed stable to slight decrease in size of mediastinal lymph nodes.  Stable posttreatment changes in the left lung apex and slight decrease in size of the absent irregular nodule.  Stable sclerotic lesion in T2 vertebral body.  No new progressive findings.  PLAN:  1.  Metastatic lung adenocarcinoma: - CT CAP on February 19, 2021 which did not show any evidence of progression. - She is tolerating immunotherapy with monthly Opdivo reasonably well. - No immunotherapy related side effects.  She has some dyspnea on exertion and dry cough which are stable.  Arthralgias have gotten better. - She is losing weight because of problems with acid reflux.  Lately her medication has been changed by Dr. Nevada Crane.  She reports eating better in the last few days. - Reviewed her labs today which showed normal LFTs and electrolytes.  CBC was grossly normal. - She agreed for lung map trial. - She  will proceed with her treatment today and monthly.  RTC 3 months with repeat CT CAP to evaluate response.   2.  High risk drug monitoring: - Last TSH was 2.02.  We will continue close monitoring.   3.  Health maintenance: - Last mammogram on 06/23/2020, BI-RADS Category 1. - She will have mammogram in August.   4.  CKD: - Baseline creatinine around 1.2-1.3. - Today creatinine improved to 1.25.  Continue hydration and avoid NSAIDs.   5.  Bone metastasis: - Bisphosphonates are on hold due to ONJ in 2019.  6.  Chronic pains: - Continue oxycodone as needed.  Prescription refill was given.   Orders placed this encounter:  No orders of the defined types were placed in this encounter.  Disclaimer: This visit was done as a virtual video visit.  We have discussed limitations of the visit with the patient in detail.  Derek Jack, MD [place] [phone]

## 2021-05-20 NOTE — Progress Notes (Signed)
Labs reviewed with MD today. Proceed with treatment as planned.   Treatment given per orders. Patient tolerated it well without problems. Vitals stable and discharged home from clinic ambulatory. Follow up as scheduled.

## 2021-05-20 NOTE — Patient Instructions (Signed)
[facility]  Discharge Instructions: Thank you for choosing [facility] to provide your oncology and hematology care.  If you have a lab appointment with the Cancer Center, please come in thru the Main Entrance and check in at the main information desk.  Wear comfortable clothing and clothing appropriate for easy access to any Portacath or PICC line.   We strive to give you quality time with your provider. You may need to reschedule your appointment if you arrive late (15 or more minutes).  Arriving late affects you and other patients whose appointments are after yours.  Also, if you miss three or more appointments without notifying the office, you may be dismissed from the clinic at the provider's discretion.      For prescription refill requests, have your pharmacy contact our office and allow 72 hours for refills to be completed.        To help prevent nausea and vomiting after your treatment, we encourage you to take your nausea medication as directed.  BELOW ARE SYMPTOMS THAT SHOULD BE REPORTED IMMEDIATELY: *FEVER GREATER THAN 100.4 F (38 C) OR HIGHER *CHILLS OR SWEATING *NAUSEA AND VOMITING THAT IS NOT CONTROLLED WITH YOUR NAUSEA MEDICATION *UNUSUAL SHORTNESS OF BREATH *UNUSUAL BRUISING OR BLEEDING *URINARY PROBLEMS (pain or burning when urinating, or frequent urination) *BOWEL PROBLEMS (unusual diarrhea, constipation, pain near the anus) TENDERNESS IN MOUTH AND THROAT WITH OR WITHOUT PRESENCE OF ULCERS (sore throat, sores in mouth, or a toothache) UNUSUAL RASH, SWELLING OR PAIN  UNUSUAL VAGINAL DISCHARGE OR ITCHING   Items with * indicate a potential emergency and should be followed up as soon as possible or go to the Emergency Department if any problems should occur.  Please show the CHEMOTHERAPY ALERT CARD or IMMUNOTHERAPY ALERT CARD at check-in to the Emergency Department and triage nurse.  Should you have questions after your visit or need to cancel  or reschedule your appointment, please contact [facility] [phone]  and follow the prompts.  Office hours are 8:00 a.m. to 4:30 p.m. Monday - Friday. Please note that voicemails left after 4:00 p.m. may not be returned until the following business day.  We are closed weekends and major holidays. You have access to a nurse at all times for urgent questions. Please call the main number to the clinic [phone] and follow the prompts.  For any non-urgent questions, you may also contact your provider using MyChart. We now offer e-Visits for anyone 18 and older to request care online for non-urgent symptoms. For details visit [email or web address].   Also download the MyChart app! Go to the app store, search "MyChart", open the app, select [facility], and log in with your MyChart username and password.  Due to Covid, a mask is required upon entering the hospital/clinic. If you do not have a mask, one will be given to you upon arrival. For doctor visits, patients may have 1 support person aged 18 or older with them. For treatment visits, patients cannot have anyone with them due to current Covid guidelines and our immunocompromised population.  

## 2021-05-28 DIAGNOSIS — B351 Tinea unguium: Secondary | ICD-10-CM | POA: Diagnosis not present

## 2021-05-28 DIAGNOSIS — G629 Polyneuropathy, unspecified: Secondary | ICD-10-CM | POA: Diagnosis not present

## 2021-06-17 ENCOUNTER — Other Ambulatory Visit (HOSPITAL_COMMUNITY): Payer: Self-pay | Admitting: Internal Medicine

## 2021-06-17 ENCOUNTER — Other Ambulatory Visit: Payer: Self-pay

## 2021-06-17 ENCOUNTER — Inpatient Hospital Stay (HOSPITAL_COMMUNITY): Payer: Medicare HMO | Attending: Hematology

## 2021-06-17 ENCOUNTER — Inpatient Hospital Stay (HOSPITAL_COMMUNITY): Payer: Medicare HMO

## 2021-06-17 VITALS — BP 120/66 | HR 71 | Temp 97.1°F | Resp 18 | Wt 100.6 lb

## 2021-06-17 DIAGNOSIS — Z9221 Personal history of antineoplastic chemotherapy: Secondary | ICD-10-CM | POA: Insufficient documentation

## 2021-06-17 DIAGNOSIS — Z5112 Encounter for antineoplastic immunotherapy: Secondary | ICD-10-CM | POA: Insufficient documentation

## 2021-06-17 DIAGNOSIS — C7971 Secondary malignant neoplasm of right adrenal gland: Secondary | ICD-10-CM | POA: Insufficient documentation

## 2021-06-17 DIAGNOSIS — C7951 Secondary malignant neoplasm of bone: Secondary | ICD-10-CM | POA: Diagnosis not present

## 2021-06-17 DIAGNOSIS — Z923 Personal history of irradiation: Secondary | ICD-10-CM | POA: Insufficient documentation

## 2021-06-17 DIAGNOSIS — Z79899 Other long term (current) drug therapy: Secondary | ICD-10-CM | POA: Diagnosis not present

## 2021-06-17 DIAGNOSIS — C3411 Malignant neoplasm of upper lobe, right bronchus or lung: Secondary | ICD-10-CM | POA: Diagnosis not present

## 2021-06-17 DIAGNOSIS — F17210 Nicotine dependence, cigarettes, uncomplicated: Secondary | ICD-10-CM | POA: Diagnosis not present

## 2021-06-17 DIAGNOSIS — R69 Illness, unspecified: Secondary | ICD-10-CM | POA: Diagnosis not present

## 2021-06-17 DIAGNOSIS — Z1231 Encounter for screening mammogram for malignant neoplasm of breast: Secondary | ICD-10-CM

## 2021-06-17 DIAGNOSIS — E278 Other specified disorders of adrenal gland: Secondary | ICD-10-CM

## 2021-06-17 LAB — CBC WITH DIFFERENTIAL/PLATELET
Abs Immature Granulocytes: 0.03 10*3/uL (ref 0.00–0.07)
Basophils Absolute: 0 10*3/uL (ref 0.0–0.1)
Basophils Relative: 0 %
Eosinophils Absolute: 0.2 10*3/uL (ref 0.0–0.5)
Eosinophils Relative: 2 %
HCT: 41.9 % (ref 36.0–46.0)
Hemoglobin: 13.7 g/dL (ref 12.0–15.0)
Immature Granulocytes: 0 %
Lymphocytes Relative: 27 %
Lymphs Abs: 2.6 10*3/uL (ref 0.7–4.0)
MCH: 30.5 pg (ref 26.0–34.0)
MCHC: 32.7 g/dL (ref 30.0–36.0)
MCV: 93.3 fL (ref 80.0–100.0)
Monocytes Absolute: 0.8 10*3/uL (ref 0.1–1.0)
Monocytes Relative: 8 %
Neutro Abs: 6 10*3/uL (ref 1.7–7.7)
Neutrophils Relative %: 63 %
Platelets: 286 10*3/uL (ref 150–400)
RBC: 4.49 MIL/uL (ref 3.87–5.11)
RDW: 15.3 % (ref 11.5–15.5)
WBC: 9.5 10*3/uL (ref 4.0–10.5)
nRBC: 0 % (ref 0.0–0.2)

## 2021-06-17 LAB — COMPREHENSIVE METABOLIC PANEL
ALT: 14 U/L (ref 0–44)
AST: 18 U/L (ref 15–41)
Albumin: 3.9 g/dL (ref 3.5–5.0)
Alkaline Phosphatase: 70 U/L (ref 38–126)
Anion gap: 6 (ref 5–15)
BUN: 16 mg/dL (ref 8–23)
CO2: 26 mmol/L (ref 22–32)
Calcium: 9.7 mg/dL (ref 8.9–10.3)
Chloride: 105 mmol/L (ref 98–111)
Creatinine, Ser: 1.2 mg/dL — ABNORMAL HIGH (ref 0.44–1.00)
GFR, Estimated: 51 mL/min — ABNORMAL LOW (ref >60)
Glucose, Bld: 102 mg/dL — ABNORMAL HIGH (ref 70–99)
Potassium: 3.9 mmol/L (ref 3.5–5.1)
Sodium: 137 mmol/L (ref 135–145)
Total Bilirubin: 0.3 mg/dL (ref 0.3–1.2)
Total Protein: 7.3 g/dL (ref 6.5–8.1)

## 2021-06-17 LAB — TSH: TSH: 1.543 u[IU]/mL (ref 0.350–4.500)

## 2021-06-17 MED ORDER — HEPARIN SOD (PORK) LOCK FLUSH 100 UNIT/ML IV SOLN
500.0000 [IU] | Freq: Once | INTRAVENOUS | Status: AC | PRN
Start: 2021-06-17 — End: 2021-06-17
  Administered 2021-06-17: 500 [IU]

## 2021-06-17 MED ORDER — SODIUM CHLORIDE 0.9 % IV SOLN: Freq: Once | INTRAVENOUS | Status: AC

## 2021-06-17 MED ORDER — SODIUM CHLORIDE 0.9 % IV SOLN
480.0000 mg | Freq: Once | INTRAVENOUS | Status: AC
  Administered 2021-06-17: 480 mg via INTRAVENOUS
  Filled 2021-06-17: qty 48

## 2021-06-17 MED ORDER — SODIUM CHLORIDE 0.9% FLUSH
10.0000 mL | INTRAVENOUS | Status: DC | PRN
  Administered 2021-06-17: 10 mL

## 2021-06-17 NOTE — Progress Notes (Signed)
Labs reviewed and parameters met for treatment. No new complaints at this time.

## 2021-06-17 NOTE — Patient Instructions (Signed)
[facility]  Discharge Instructions: Thank you for choosing [facility] to provide your oncology and hematology care.  If you have a lab appointment with the Cancer Center, please come in thru the Main Entrance and check in at the main information desk.  Wear comfortable clothing and clothing appropriate for easy access to any Portacath or PICC line.   We strive to give you quality time with your provider. You may need to reschedule your appointment if you arrive late (15 or more minutes).  Arriving late affects you and other patients whose appointments are after yours.  Also, if you miss three or more appointments without notifying the office, you may be dismissed from the clinic at the provider's discretion.      For prescription refill requests, have your pharmacy contact our office and allow 72 hours for refills to be completed.        To help prevent nausea and vomiting after your treatment, we encourage you to take your nausea medication as directed.  BELOW ARE SYMPTOMS THAT SHOULD BE REPORTED IMMEDIATELY: *FEVER GREATER THAN 100.4 F (38 C) OR HIGHER *CHILLS OR SWEATING *NAUSEA AND VOMITING THAT IS NOT CONTROLLED WITH YOUR NAUSEA MEDICATION *UNUSUAL SHORTNESS OF BREATH *UNUSUAL BRUISING OR BLEEDING *URINARY PROBLEMS (pain or burning when urinating, or frequent urination) *BOWEL PROBLEMS (unusual diarrhea, constipation, pain near the anus) TENDERNESS IN MOUTH AND THROAT WITH OR WITHOUT PRESENCE OF ULCERS (sore throat, sores in mouth, or a toothache) UNUSUAL RASH, SWELLING OR PAIN  UNUSUAL VAGINAL DISCHARGE OR ITCHING   Items with * indicate a potential emergency and should be followed up as soon as possible or go to the Emergency Department if any problems should occur.  Please show the CHEMOTHERAPY ALERT CARD or IMMUNOTHERAPY ALERT CARD at check-in to the Emergency Department and triage nurse.  Should you have questions after your visit or need to cancel  or reschedule your appointment, please contact [facility] [phone]  and follow the prompts.  Office hours are 8:00 a.m. to 4:30 p.m. Monday - Friday. Please note that voicemails left after 4:00 p.m. may not be returned until the following business day.  We are closed weekends and major holidays. You have access to a nurse at all times for urgent questions. Please call the main number to the clinic [phone] and follow the prompts.  For any non-urgent questions, you may also contact your provider using MyChart. We now offer e-Visits for anyone 18 and older to request care online for non-urgent symptoms. For details visit [email or web address].   Also download the MyChart app! Go to the app store, search "MyChart", open the app, select [facility], and log in with your MyChart username and password.  Due to Covid, a mask is required upon entering the hospital/clinic. If you do not have a mask, one will be given to you upon arrival. For doctor visits, patients may have 1 support person aged 18 or older with them. For treatment visits, patients cannot have anyone with them due to current Covid guidelines and our immunocompromised population.  

## 2021-06-17 NOTE — Progress Notes (Signed)
Treatment given per orders. Patient tolerated it well without problems. Vitals stable and discharged home from clinic ambulatory. Follow up as scheduled.  

## 2021-06-28 ENCOUNTER — Ambulatory Visit (HOSPITAL_COMMUNITY)
Admission: RE | Admit: 2021-06-28 | Discharge: 2021-06-28 | Disposition: A | Discharge status: Home / Self Care | Payer: Medicare HMO | Source: Ambulatory Visit | Attending: Internal Medicine | Admitting: Internal Medicine

## 2021-06-28 ENCOUNTER — Other Ambulatory Visit: Payer: Self-pay

## 2021-06-28 DIAGNOSIS — Z1231 Encounter for screening mammogram for malignant neoplasm of breast: Secondary | ICD-10-CM

## 2021-07-15 ENCOUNTER — Other Ambulatory Visit: Payer: Self-pay

## 2021-07-15 ENCOUNTER — Inpatient Hospital Stay (HOSPITAL_COMMUNITY): Payer: Medicare HMO | Attending: Hematology

## 2021-07-15 ENCOUNTER — Inpatient Hospital Stay (HOSPITAL_COMMUNITY): Payer: Medicare HMO

## 2021-07-15 VITALS — BP 115/69 | HR 82 | Temp 96.8°F | Resp 18 | Wt 101.4 lb

## 2021-07-15 DIAGNOSIS — Z923 Personal history of irradiation: Secondary | ICD-10-CM | POA: Diagnosis not present

## 2021-07-15 DIAGNOSIS — C3411 Malignant neoplasm of upper lobe, right bronchus or lung: Secondary | ICD-10-CM

## 2021-07-15 DIAGNOSIS — C7971 Secondary malignant neoplasm of right adrenal gland: Secondary | ICD-10-CM | POA: Insufficient documentation

## 2021-07-15 DIAGNOSIS — E278 Other specified disorders of adrenal gland: Secondary | ICD-10-CM

## 2021-07-15 DIAGNOSIS — Z5112 Encounter for antineoplastic immunotherapy: Secondary | ICD-10-CM | POA: Insufficient documentation

## 2021-07-15 DIAGNOSIS — Z9221 Personal history of antineoplastic chemotherapy: Secondary | ICD-10-CM | POA: Insufficient documentation

## 2021-07-15 DIAGNOSIS — F17210 Nicotine dependence, cigarettes, uncomplicated: Secondary | ICD-10-CM | POA: Insufficient documentation

## 2021-07-15 DIAGNOSIS — C7951 Secondary malignant neoplasm of bone: Secondary | ICD-10-CM | POA: Insufficient documentation

## 2021-07-15 DIAGNOSIS — R69 Illness, unspecified: Secondary | ICD-10-CM | POA: Diagnosis not present

## 2021-07-15 DIAGNOSIS — Z79899 Other long term (current) drug therapy: Secondary | ICD-10-CM | POA: Insufficient documentation

## 2021-07-15 LAB — CBC WITH DIFFERENTIAL/PLATELET
Abs Immature Granulocytes: 0.03 10*3/uL (ref 0.00–0.07)
Basophils Absolute: 0 10*3/uL (ref 0.0–0.1)
Basophils Relative: 0 %
Eosinophils Absolute: 0.2 10*3/uL (ref 0.0–0.5)
Eosinophils Relative: 2 %
HCT: 44.3 % (ref 36.0–46.0)
Hemoglobin: 14.5 g/dL (ref 12.0–15.0)
Immature Granulocytes: 0 %
Lymphocytes Relative: 23 %
Lymphs Abs: 2.3 10*3/uL (ref 0.7–4.0)
MCH: 30.9 pg (ref 26.0–34.0)
MCHC: 32.7 g/dL (ref 30.0–36.0)
MCV: 94.3 fL (ref 80.0–100.0)
Monocytes Absolute: 0.6 10*3/uL (ref 0.1–1.0)
Monocytes Relative: 6 %
Neutro Abs: 7 10*3/uL (ref 1.7–7.7)
Neutrophils Relative %: 69 %
Platelets: 272 10*3/uL (ref 150–400)
RBC: 4.7 MIL/uL (ref 3.87–5.11)
RDW: 15.3 % (ref 11.5–15.5)
WBC: 10.1 10*3/uL (ref 4.0–10.5)
nRBC: 0 % (ref 0.0–0.2)

## 2021-07-15 LAB — COMPREHENSIVE METABOLIC PANEL
ALT: 14 U/L (ref 0–44)
AST: 20 U/L (ref 15–41)
Albumin: 4.2 g/dL (ref 3.5–5.0)
Alkaline Phosphatase: 72 U/L (ref 38–126)
Anion gap: 7 (ref 5–15)
BUN: 17 mg/dL (ref 8–23)
CO2: 27 mmol/L (ref 22–32)
Calcium: 9.8 mg/dL (ref 8.9–10.3)
Chloride: 102 mmol/L (ref 98–111)
Creatinine, Ser: 1.26 mg/dL — ABNORMAL HIGH (ref 0.44–1.00)
GFR, Estimated: 48 mL/min — ABNORMAL LOW (ref >60)
Glucose, Bld: 103 mg/dL — ABNORMAL HIGH (ref 70–99)
Potassium: 4.2 mmol/L (ref 3.5–5.1)
Sodium: 136 mmol/L (ref 135–145)
Total Bilirubin: 0.5 mg/dL (ref 0.3–1.2)
Total Protein: 7.9 g/dL (ref 6.5–8.1)

## 2021-07-15 LAB — TSH: TSH: 1.813 u[IU]/mL (ref 0.350–4.500)

## 2021-07-15 MED ORDER — SODIUM CHLORIDE 0.9% FLUSH
10.0000 mL | INTRAVENOUS | Status: DC | PRN
  Administered 2021-07-15: 10 mL

## 2021-07-15 MED ORDER — HEPARIN SOD (PORK) LOCK FLUSH 100 UNIT/ML IV SOLN
500.0000 [IU] | Freq: Once | INTRAVENOUS | Status: AC | PRN
  Administered 2021-07-15: 500 [IU]

## 2021-07-15 MED ORDER — SODIUM CHLORIDE 0.9 % IV SOLN: Freq: Once | INTRAVENOUS | Status: AC

## 2021-07-15 MED ORDER — SODIUM CHLORIDE 0.9 % IV SOLN
480.0000 mg | Freq: Once | INTRAVENOUS | Status: AC
  Administered 2021-07-15: 480 mg via INTRAVENOUS
  Filled 2021-07-15: qty 48

## 2021-07-15 NOTE — Patient Instructions (Signed)
[facility]  Discharge Instructions: Thank you for choosing [facility] to provide your oncology and hematology care.  If you have a lab appointment with the [facility], please come in thru the Main Entrance and check in at the main information desk.  Wear comfortable clothing and clothing appropriate for easy access to any Portacath or PICC line.   We strive to give you quality time with your provider. You may need to reschedule your appointment if you arrive late (15 or more minutes).  Arriving late affects you and other patients whose appointments are after yours.  Also, if you miss three or more appointments without notifying the office, you may be dismissed from the clinic at the provider's discretion.      For prescription refill requests, have your pharmacy contact our office and allow 72 hours for refills to be completed.    Today you received the following chemotherapy and/or immunotherapy agents Opdivo      To help prevent nausea and vomiting after your treatment, we encourage you to take your nausea medication as directed.  BELOW ARE SYMPTOMS THAT SHOULD BE REPORTED IMMEDIATELY: *FEVER GREATER THAN 100.4 F (38 C) OR HIGHER *CHILLS OR SWEATING *NAUSEA AND VOMITING THAT IS NOT CONTROLLED WITH YOUR NAUSEA MEDICATION *UNUSUAL SHORTNESS OF BREATH *UNUSUAL BRUISING OR BLEEDING *URINARY PROBLEMS (pain or burning when urinating, or frequent urination) *BOWEL PROBLEMS (unusual diarrhea, constipation, pain near the anus) TENDERNESS IN MOUTH AND THROAT WITH OR WITHOUT PRESENCE OF ULCERS (sore throat, sores in mouth, or a toothache) UNUSUAL RASH, SWELLING OR PAIN  UNUSUAL VAGINAL DISCHARGE OR ITCHING   Items with * indicate a potential emergency and should be followed up as soon as possible or go to the Emergency Department if any problems should occur.  Please show the CHEMOTHERAPY ALERT CARD or IMMUNOTHERAPY ALERT CARD at check-in to the Emergency  Department and triage nurse.  Should you have questions after your visit or need to cancel or reschedule your appointment, please contact [facility] [phone]  and follow the prompts.  Office hours are 8:00 a.m. to 4:30 p.m. Monday - Friday. Please note that voicemails left after 4:00 p.m. may not be returned until the following business day.  We are closed weekends and major holidays. You have access to a nurse at all times for urgent questions. Please call the main number to the clinic [phone] and follow the prompts.  For any non-urgent questions, you may also contact your provider using MyChart. We now offer e-Visits for anyone 36 and older to request care online for non-urgent symptoms. For details visit [email or web address].   Also download the MyChart app! Go to the app store, search "MyChart", open the app, select [facility], and log in with your MyChart username and password.  Due to Covid, a mask is required upon entering the hospital/clinic. If you do not have a mask, one will be given to you upon arrival. For doctor visits, patients may have 1 support person aged 1 or older with them. For treatment visits, patients cannot have anyone with them due to current Covid guidelines and our immunocompromised population.

## 2021-07-15 NOTE — Progress Notes (Signed)
Patient presents today for Opdivo infusion per providers order.  Vital signs and labs within parameters for treatment.    Opdivo infusion given today per MD orders.  Stable during infusion without adverse affects.  Vital signs stable.  No complaints at this time.  Discharge from clinic ambulatory in stable condition.  Alert and oriented X 3.  Follow up with [facility] as scheduled.

## 2021-07-15 NOTE — Progress Notes (Signed)
Patient called and instructed to stop taking Folic acid per Dr. Delton Coombes.  Patient expressed understanding.

## 2021-07-21 DIAGNOSIS — K219 Gastro-esophageal reflux disease without esophagitis: Secondary | ICD-10-CM | POA: Diagnosis not present

## 2021-07-21 DIAGNOSIS — D649 Anemia, unspecified: Secondary | ICD-10-CM | POA: Diagnosis not present

## 2021-08-06 DIAGNOSIS — G629 Polyneuropathy, unspecified: Secondary | ICD-10-CM | POA: Diagnosis not present

## 2021-08-06 DIAGNOSIS — B351 Tinea unguium: Secondary | ICD-10-CM | POA: Diagnosis not present

## 2021-08-09 ENCOUNTER — Ambulatory Visit (HOSPITAL_COMMUNITY): Payer: Medicare HMO

## 2021-08-11 ENCOUNTER — Ambulatory Visit (HOSPITAL_COMMUNITY)
Admission: RE | Admit: 2021-08-11 | Discharge: 2021-08-11 | Disposition: A | Discharge status: Home / Self Care | Payer: Medicare HMO | Source: Ambulatory Visit | Attending: Hematology | Admitting: Hematology

## 2021-08-11 ENCOUNTER — Other Ambulatory Visit: Payer: Self-pay

## 2021-08-11 DIAGNOSIS — K8020 Calculus of gallbladder without cholecystitis without obstruction: Secondary | ICD-10-CM | POA: Diagnosis not present

## 2021-08-11 DIAGNOSIS — K5730 Diverticulosis of large intestine without perforation or abscess without bleeding: Secondary | ICD-10-CM | POA: Diagnosis not present

## 2021-08-11 DIAGNOSIS — C3411 Malignant neoplasm of upper lobe, right bronchus or lung: Secondary | ICD-10-CM | POA: Diagnosis not present

## 2021-08-11 DIAGNOSIS — I700 Atherosclerosis of aorta: Secondary | ICD-10-CM | POA: Diagnosis not present

## 2021-08-11 DIAGNOSIS — N858 Other specified noninflammatory disorders of uterus: Secondary | ICD-10-CM | POA: Diagnosis not present

## 2021-08-11 DIAGNOSIS — J929 Pleural plaque without asbestos: Secondary | ICD-10-CM | POA: Diagnosis not present

## 2021-08-11 DIAGNOSIS — J432 Centrilobular emphysema: Secondary | ICD-10-CM | POA: Diagnosis not present

## 2021-08-11 DIAGNOSIS — Z85118 Personal history of other malignant neoplasm of bronchus and lung: Secondary | ICD-10-CM | POA: Diagnosis not present

## 2021-08-11 MED ORDER — IOHEXOL 350 MG/ML SOLN
100.0000 mL | Freq: Once | INTRAVENOUS | Status: AC | PRN
  Administered 2021-08-11: 75 mL via INTRAVENOUS

## 2021-08-11 NOTE — Progress Notes (Signed)
[facility] [address] [place] [address]   CLINIC:  Medical Oncology/Hematology  PCP:  [name], MD [address] [place] [address] [phone]   REASON FOR VISIT:  Follow-up for metastatic right lung cancer  PRIOR THERAPY:  1. Bevacizumab, cisplatin and pemetrexed x 4 cycles from 09/02/2014 to 11/04/2014. 2. Bevacizumab and pemetrexed x 3 cycles from 12/24/2014 to 02/04/2015.  NGS Results: not done  CURRENT THERAPY: Nivolumab every 4 weeks  BRIEF ONCOLOGIC HISTORY:  Oncology History  Cancer of upper lobe of right lung ([place])  07/28/2014 Imaging   CT chest: Large R apical mass consistent with malignancy. This is destroying the R 2nd rib with extension into adjacent soft tissue. R hilar adenopathy with R 5cm adrenal metastatic lesion.   08/01/2014 Initial Biopsy   Lung, needle/core biopsy(ies), right upper lobe - POORLY DIFFERENTIATED ADENOCARCINOMA, SEE COMMENT.   08/08/2014 PET scan   Large hypermetabolic R apical mass with evidence of direct chest wall and mediastinal invasion, right retrocrural lymphadenopathy, extensive retroperitoneal lymphadenopathy, and metastatic lesions to the adrenal glands    09/02/2014 - 11/04/2014 Chemotherapy   Cisplatin/Pemetrexed/Avastin every 21 days x 4 cycles   10/07/2014 - 10/27/2014 Radiation Therapy   Right lung apex for control of brachioplexopathy.   12/24/2014 - 02/25/2015 Chemotherapy   Alimta/Avastin every 21 days.   02/20/2015 Imaging   Increase in size of right adrenal metastasis and subjacent confluent retrocaval lymphadenopathy   02/25/2015 -  Chemotherapy   Nivolumab, zometa   05/04/2015 Imaging   CT CAP- Stable to slight decrease in the posterior right apical lesion. Stable appearance of posterior right upper rib an upper thoracic bony lesions. Slight improvement in right upper lobe tree-in-bud opacity. No new or progressive findings in...   07/28/2015 Imaging   CT CAP- Reduced size of the  right apical pleural parenchymal lesion and reduced size of the right adrenal metastatic lesion. Resolution of prior retrocrural adenopathy.  Right eccentric T1 and T2 sclerosis with sclerosis and tapering of the right second..   11/17/2015 Imaging   CT CAP- Stable soft tissue thickening in the apex of the right hemi thorax. Stable right adrenal metastasis. Nodularity along the trachea and mainstem bronchi, relatively new from 07/28/2015, favoring adherent debris.   11/18/2015 Treatment Plan Change   Zometa HELD for upcoming tooth extraction   11/24/2015 Treatment Plan Change   Zometa on hold at this time in preparation for tooth extraction in March 2017.  Zometa las given on 11/18/2015.   02/03/2016 Imaging   CT CAP- Heterogeneous right apical masslike consolidation and right adrenal metastasis are unchanged   04/06/2016 Treatment Plan Change   Zometa restarted 6 weeks out from tooth extraction (04/06/2016)   05/12/2016 Imaging   CT CAP- NED in the chest, abdomen or pelvis.  Some areas of nodularity associated with the mainstem bronchi in the left upper lobe bronchus, favored to represent adherent inspissated secretions   08/17/2016 Imaging   CT CAP- 1. Stable CTs of the chest and abdomen. No evidence of progressive metastatic disease. 2. Probable treated tumor at the right apex, right adrenal gland and T2 vertebral body, stable. 3. Fluctuating nodularity along the walls of the trachea and mainstem bronchi, likely secretions.   12/12/2016 Imaging   Further decrease in size of treated tumor within the right apex. 2. Stable treated tumor involving the right second rib and T2 vertebra. 3. Stable right adrenal gland treated tumor. 4. Emphysema 5. Aortic atherosclerosis  01/04/2017 Imaging   MRI brain- Normal brain MRI.  No intracranial metastatic disease.   03/15/2017 Imaging   CT CAP- 1. No new or progressive metastatic disease in the chest or abdomen. 2. Stable treated tumor in the  apical right upper lobe. Stable treated right posterior second rib and right T2 vertebral lesions. Stable treated right adrenal metastasis. 3. Aortic atherosclerosis. 4. Moderate emphysema with mild diffuse bronchial wall thickening, suggesting COPD.   06/12/2017 Imaging   CT CAP 1. Similar appearance of treated primary within the right apex. 2. Similar areas of sclerosis within the right second rib, T2, and less so T1 vertebral bodies. These are most consistent with treated metastasis. 3. Similar right adrenal treated metastasis. 4. No evidence of new or progressive disease. 5. Similar right and progressive left areas of bronchial wall thickening and probable mucoid impaction. Correlate with interval infectious symptoms. 6.  Emphysema (ICD10-J43.9). 7. Coronary artery atherosclerosis. Aortic Atherosclerosis (ICD10-I70.0).     10/16/2017 Imaging   CT CAP 1. Stable appearance of the prior Pancoast tumor and related bony findings in the right second rib and right T1 and T2 vertebra compatible with successfully treated tumor. No significant enlargement or new lesions are identified. Similarly the treated right adrenal metastatic lesion is stable in appearance. 2. Upper normal size right hilar lymph node may warrant surveillance. Currently 9 mm in short axis. 3. Other imaging findings of potential clinical significance: Aortic Atherosclerosis (ICD10-I70.0) and Emphysema (ICD10-J43.9). Scattered proximal sigmoid colon diverticula. Mucus plugging medially in the left upper lobe and posteriorly in the right upper lobe.       CANCER STAGING: Cancer Staging No matching staging information was found for the patient.  INTERVAL HISTORY:  Melissa Sullivan, a 65 y.o. female, returns for routine follow-up and consideration for next cycle of chemotherapy. Melissa Sullivan was last seen on 05/20/2021.  Due for cycle #49 of Nivolumab today.   Overall, she tells me she has been feeling pretty  well. She lost 3 pounds in 1 month, and she reports her appetite is fair but still below her baseline. She is drinking 4 Boost with 2 small meals daily. She reports fatigue. She denies n/v/d and dizziness, and her cough had improved.  Overall, she feels ready for next cycle of chemo today.   REVIEW OF SYSTEMS:  Review of Systems  Constitutional:  Positive for appetite change (60%), fatigue (50%) and unexpected weight change (-3 lbs).  Respiratory:  Positive for cough.   Gastrointestinal:  Negative for diarrhea, nausea and vomiting.  Neurological:  Negative for dizziness.  All other systems reviewed and are negative.  PAST MEDICAL/SURGICAL HISTORY:  Past Medical History:  Diagnosis Date   Adrenal mass, right ([facility]) 07/28/2014   Anemia    Bone metastases ([facility]) 04/05/2016   Cancer ([facility]) 2015   lung  right   Diabetes mellitus without complication ([facility])    GERD (gastroesophageal reflux disease)    Hyperlipidemia    Hypertension    Hypothyroidism due to medication 01/30/2017   Lung mass 07/28/2014   Reflux    Past Surgical History:  Procedure Laterality Date   AMPUTATION Right 02/22/2017   Procedure: PARTIAL AMPUTATION RIGHT GREAT TOE;  Surgeon: Caprice Beaver, DPM;  Location: [facility];  Service: Podiatry;  Laterality: Right;   APPENDECTOMY     BIOPSY  09/22/2020   Procedure: BIOPSY;  Surgeon: Eloise Harman, DO;  Location: [facility];  Service: Endoscopy;;   COLONOSCOPY WITH PROPOFOL N/A 09/22/2020   non-bleeding internal  hemorrhoids, sigmoid and descending colon diverticulosis, localized mild inflammation in sigmoid that was felt due to prep artifact.    ESOPHAGOGASTRODUODENOSCOPY N/A 12/09/2014   FBP:ZWCHEN esophageal stricture/mild-to-noderate erosive gastritis. negative H.pylori   FLEXIBLE SIGMOIDOSCOPY  2011   Dr. [name]: hyperplastic polyp   LUNG BIOPSY Right 07/2014   CT guided   MALONEY DILATION N/A 12/09/2014   Procedure: MALONEY DILATION;  Surgeon: [name], MD;   Location: [facility];  Service: Endoscopy;  Laterality: N/A;   PORTACATH PLACEMENT Left 09/01/2014   SAVORY DILATION N/A 12/09/2014   Procedure: SAVORY DILATION;  Surgeon: [name], MD;  Location: [facility];  Service: Endoscopy;  Laterality: N/A;    SOCIAL HISTORY:  Social History   Socioeconomic History   Marital status: Married    Spouse name: Not on file   Number of children: Not on file   Years of education: Not on file   Highest education level: Not on file  Occupational History   Not on file  Tobacco Use   Smoking status: Light Smoker    Packs/day: 0.30    Years: 20.00    Pack years: 6.00    Types: Cigarettes   Smokeless tobacco: Never  Vaping Use   Vaping Use: Never used  Substance and Sexual Activity   Alcohol use: No   Drug use: No   Sexual activity: Not on file  Other Topics Concern   Not on file  Social History Narrative   Not on file   Social Determinants of Health   Financial Resource Strain: Low Risk    Difficulty of Paying Living Expenses: Not hard at all  Food Insecurity: No Food Insecurity   Worried About Charity fundraiser in the Last Year: Never true   Ran Out of Food in the Last Year: Never true  Transportation Needs: No Transportation Needs   Lack of Transportation (Medical): No   Lack of Transportation (Non-Medical): No  Physical Activity: Not on file  Stress: No Stress Concern Present   Feeling of Stress : Only a little  Social Connections: Moderately Isolated   Frequency of Communication with Friends and Family: More than three times a week   Frequency of Social Gatherings with Friends and Family: More than three times a week   Attends Religious Services: Never   Marine scientist or Organizations: No   Attends Music therapist: Never   Marital Status: Married  Human resources officer Violence: Not At Risk   Fear of Current or Ex-Partner: No   Emotionally Abused: No   Physically Abused: No   Sexually Abused: No     FAMILY HISTORY:  Family History  Problem Relation Age of Onset   Cancer Sister    Colon cancer Neg Hx     CURRENT MEDICATIONS:  Current Outpatient Medications  Medication Sig Dispense Refill   dexlansoprazole (DEXILANT) 60 MG capsule Dexilant 60 mg capsule, delayed release  TAKE 1 CAPSULE BY MOUTH IN THE MORNING WITH BREAKFAST     dronabinol (MARINOL) 2.5 MG capsule Take 1 capsule (2.5 mg total) by mouth 2 (two) times daily before lunch and supper. 60 capsule 2   ENSURE (ENSURE) Take 1 Can by mouth 4 (four) times daily.     Ferrous Sulfate (IRON) 325 (65 Fe) MG TABS Take 325 mg by mouth daily.      Fluticasone-Salmeterol (ADVAIR DISKUS) 500-50 MCG/DOSE AEPB Inhale 1 puff into the lungs 2 (two) times daily. 60 each 6  levocetirizine (XYZAL) 5 MG tablet Take 5 mg by mouth every evening.      levothyroxine (SYNTHROID) 25 MCG tablet TAKE 1/2 (ONE-HALF) TABLET BY MOUTH ONCE DAILY BEFORE BREAKFAST 45 tablet 0   lidocaine-prilocaine (EMLA) cream Apply 1 application topically as needed (port access).     naloxone (NARCAN) nasal spray 4 mg/0.1 mL naloxone 4 mg/actuation nasal spray     Nivolumab (OPDIVO IV) Inject into the vein every 28 (twenty-eight) days.     omeprazole (PRILOSEC) 40 MG capsule omeprazole 40 mg capsule,delayed release  Take one capsule by mouth once daily about 30 minutes before breakfast     pantoprazole (PROTONIX) 40 MG tablet Take 1 tablet (40 mg total) by mouth daily. 30 minutes before breakfast 90 tablet 3   rosuvastatin (CRESTOR) 20 MG tablet Take 20 mg by mouth daily.     simvastatin (ZOCOR) 40 MG tablet simvastatin 40 mg tablet  TAKE 1 TABLET BY MOUTH ONCE DAILY     albuterol (VENTOLIN HFA) 108 (90 Base) MCG/ACT inhaler Inhale 2 puffs into the lungs every 6 (six) hours as needed for wheezing or shortness of breath.  (Patient not taking: Reported on 08/12/2021)     Oxycodone HCl 10 MG TABS Take 1 tablet (10 mg total) by mouth every 12 (twelve) hours as needed  (pain.). (Patient not taking: Reported on 08/12/2021) 60 tablet 0   pseudoephedrine-guaifenesin (MUCINEX D) 60-600 MG 12 hr tablet Take 1 tablet by mouth daily as needed for congestion. (Patient not taking: Reported on 08/12/2021)     No current facility-administered medications for this visit.   Facility-Administered Medications Ordered in Other Visits  Medication Dose Route Frequency Provider Last Rate Last Admin   sodium chloride flush (NS) 0.9 % injection 10 mL  10 mL Intracatheter PRN Derek Jack, MD   10 mL at 01/03/19 0930    ALLERGIES:  Allergies  Allergen Reactions   Xgeva [Denosumab]     osteonecrosis    PHYSICAL EXAM:  Performance status (ECOG): 0 - Asymptomatic  Vitals:   08/12/21 1035  BP: 136/78  Pulse: 87  Resp: 18  Temp: (!) 96.4 F (35.8 C)  SpO2: 97%   Wt Readings from Last 3 Encounters:  08/12/21 98 lb 12.3 oz (44.8 kg)  07/15/21 101 lb 6.4 oz (46 kg)  06/17/21 100 lb 9.6 oz (45.6 kg)   Physical Exam Vitals reviewed.  Constitutional:      Appearance: Normal appearance.  Cardiovascular:     Rate and Rhythm: Normal rate and regular rhythm.     Pulses: Normal pulses.     Heart sounds: Normal heart sounds.  Pulmonary:     Effort: Pulmonary effort is normal.     Breath sounds: Normal breath sounds.  Abdominal:     Palpations: Abdomen is soft. There is no hepatomegaly, splenomegaly or mass.     Tenderness: There is no abdominal tenderness.  Musculoskeletal:     Right lower leg: No edema.     Left lower leg: No edema.  Neurological:     General: No focal deficit present.     Mental Status: She is alert and oriented to person, place, and time.  Psychiatric:        Mood and Affect: Mood normal.        Behavior: Behavior normal.    LABORATORY DATA:  I have reviewed the labs as listed.  CBC Latest Ref Rng & Units 08/12/2021 07/15/2021 06/17/2021  WBC 4.0 - 10.5 K/uL 8.3 10.1 9.5  Hemoglobin 12.0 - 15.0 g/dL 13.9 14.5 13.7  Hematocrit 36.0 -  46.0 % 43.2 44.3 41.9  Platelets 150 - 400 K/uL 276 272 286   CMP Latest Ref Rng & Units 08/12/2021 07/15/2021 06/17/2021  Glucose 70 - 99 mg/dL 97 103(H) 102(H)  BUN 8 - 23 mg/dL 10 17 16   Creatinine 0.44 - 1.00 mg/dL 1.20(H) 1.26(H) 1.20(H)  Sodium 135 - 145 mmol/L 135 136 137  Potassium 3.5 - 5.1 mmol/L 4.1 4.2 3.9  Chloride 98 - 111 mmol/L 102 102 105  CO2 22 - 32 mmol/L 26 27 26   Calcium 8.9 - 10.3 mg/dL 9.8 9.8 9.7  Total Protein 6.5 - 8.1 g/dL 7.7 7.9 7.3  Total Bilirubin 0.3 - 1.2 mg/dL 0.4 0.5 0.3  Alkaline Phos 38 - 126 U/L 60 72 70  AST 15 - 41 U/L 19 20 18   ALT 0 - 44 U/L 13 14 14     DIAGNOSTIC IMAGING:  I have independently reviewed the scans and discussed with the patient. CT CHEST ABDOMEN PELVIS W CONTRAST  Result Date: 08/12/2021 CLINICAL DATA:  65 year old female with history of non-small cell lung cancer. Evaluate for treatment response. EXAM: CT CHEST, ABDOMEN, AND PELVIS WITH CONTRAST TECHNIQUE: Multidetector CT imaging of the chest, abdomen and pelvis was performed following the standard protocol during bolus administration of intravenous contrast. CONTRAST:  33mL OMNIPAQUE IOHEXOL 350 MG/ML SOLN COMPARISON:  CT of the chest, abdomen and pelvis 02/19/2021. FINDINGS: CT CHEST FINDINGS Cardiovascular: Heart size is normal. There is no significant pericardial fluid, thickening or pericardial calcification. Aortic atherosclerosis. No definite coronary artery calcifications. Left-sided internal jugular single-lumen porta cath with tip terminating in the mid superior vena cava. Mediastinum/Nodes: No pathologically enlarged mediastinal or hilar lymph nodes. Esophagus is unremarkable in appearance. No axillary lymphadenopathy. Lungs/Pleura: There continues to be pleural thickening calcification, along with chronic septal thickening in the apex of the right lung, similar to numerous prior examinations, at the site of the treated mass, indicative of chronic postradiation mass-like  fibrosis. No definite suspicious appearing pulmonary nodule or mass is otherwise noted. No acute consolidative airspace disease. No pleural effusions. Diffuse bronchial wall thickening with moderate centrilobular and paraseptal emphysema. Musculoskeletal: Irregular sclerosis of the lateral aspect of the right T2 vertebral body or there is a mild compression deformity, similar to prior examinations, likely related to prior radiation therapy. There are no aggressive appearing lytic or blastic lesions noted in the visualized portions of the skeleton. CT ABDOMEN PELVIS FINDINGS Hepatobiliary: No suspicious cystic or solid hepatic lesions. No intra or extrahepatic biliary ductal dilatation. Tiny calcified gallstone lying dependently in the gallbladder. Gallbladder is otherwise unremarkable in appearance. Pancreas: No pancreatic mass. No pancreatic ductal dilatation. No pancreatic or peripancreatic fluid collections or inflammatory changes. Spleen: Unremarkable. Adrenals/Urinary Tract: Bilateral kidneys and the left adrenal gland are normal in appearance. Chronic calcified right adrenal mass, similar to prior studies, presumably sequela of remote right adrenal hemorrhage or infection. Urinary bladder is normal in appearance. Stomach/Bowel: Normal appearance of the stomach. No pathologic dilatation of small bowel or colon. Numerous colonic diverticulae are noted, particularly in the descending colon and sigmoid colon, without surrounding inflammatory changes to suggest an acute diverticulitis at this time. The appendix is not confidently identified and may be surgically absent. Regardless, there are no inflammatory changes noted adjacent to the cecum to suggest the presence of an acute appendicitis at this time. Vascular/Lymphatic: Aortic atherosclerosis, without evidence of aneurysm or dissection in the abdominal or pelvic vasculature. No lymphadenopathy  noted in the abdomen or pelvis. Reproductive: Uterus and ovaries are  atrophic. Other: No significant volume of ascites.  No pneumoperitoneum. Musculoskeletal: There are no aggressive appearing lytic or blastic lesions noted in the visualized portions of the skeleton. IMPRESSION: 1. Stable post treatment related changes of prior radiation therapy in the upper right hemithorax, with no findings to suggest residual, recurrent or metastatic disease. 2. Aortic atherosclerosis. 3. Diffuse bronchial wall thickening with moderate centrilobular and paraseptal emphysema; imaging findings suggestive of underlying COPD. 4. Cholelithiasis without evidence of acute cholecystitis at this time. 5. Colonic diverticulosis without evidence of acute diverticulitis. 6. Additional incidental findings, as above. Electronically Signed   By: Vinnie Langton M.D.   On: 08/12/2021 10:30     ASSESSMENT:  1. Cancer of upper lobe of right lung [facility]) Metastatic adenocarcinoma of the lung to the bones and right adrenal: -Foundation 1 testing shows 14 genomic alterations, no approved therapies. -Opdivo 480 mg monthly started on February 25, 2015. -CT CAP on February 21, 2020 shows no evidence of progression or metastatic disease.  T2 sclerotic lesion is stable. -CTAP on 08/10/2020 with no evidence of recurrent metastatic disease in the abdomen or pelvis.  Irregular mildly enlarged right adrenal gland unchanged. -CT chest on 09/09/2020 showed partial clearing of the airspace opacities noted medially in the lingula and left lower lobe.  New irregular nodule posteriorly at the right apex likely inflammatory.  Stable sclerotic T2 vertebral body lesion. -Reviewed CT CAP from 02/19/2021 which showed stable to slight decrease in size of mediastinal lymph nodes.  Stable posttreatment changes in the left lung apex and slight decrease in size of the absent irregular nodule.  Stable sclerotic lesion in T2 vertebral body.  No new progressive findings.   PLAN:  1.  Metastatic lung adenocarcinoma: - Reviewed CT CAP from  08/11/2021.  Stable posttreatment changes in the right upper hemithorax.  No evidence of recurrence or metastatic disease.  Findings of COPD, cholelithiasis and colonic diverticulosis. - She does not report any diarrhea or new onset cough or skin rashes. - Reviewed labs today which showed normal CBC.  LFTs were normal. - We will continue monthly nivolumab.  RTC 3 months with labs and treatment.   2.  High risk drug monitoring: - TSH today is 1.794. - Continue Synthroid 25 mcg daily.   3.  Health maintenance: - Reviewed mammogram from 06/28/2021, BI-RADS Category 1.   4.  CKD: - Baseline creatinine around 1.2-1.3. - Labs from today shows creatinine of 1.20.   5.  Bone metastasis: - Bisphosphonates are on hold due to ONJ in 2019.  6.  Chronic pains: - Continue oxycodone as needed.  Refills given.  7.  Weight loss: - She self discontinued Marinol 1 month ago. - He lost about 3 pounds in the last 1 month. - She is drinking boost 2 cans in the morning and 2 cans in the evening and eating 2 small meals per day. - We will start her back on Marinol 2.5 mg twice daily.   Orders placed this encounter:  No orders of the defined types were placed in this encounter.    Derek Jack, MD [place] [phone]   I, Thana Ates, am acting as a scribe for Dr. Derek Jack.  I, Derek Jack MD, have reviewed the above documentation for accuracy and completeness, and I agree with the above.

## 2021-08-12 ENCOUNTER — Inpatient Hospital Stay (HOSPITAL_COMMUNITY): Payer: Medicare HMO | Attending: Hematology

## 2021-08-12 ENCOUNTER — Inpatient Hospital Stay (HOSPITAL_COMMUNITY): Payer: Medicare HMO

## 2021-08-12 ENCOUNTER — Other Ambulatory Visit (HOSPITAL_COMMUNITY): Payer: Self-pay | Admitting: Hematology

## 2021-08-12 ENCOUNTER — Inpatient Hospital Stay (HOSPITAL_BASED_OUTPATIENT_CLINIC_OR_DEPARTMENT_OTHER): Payer: Medicare HMO | Admitting: Hematology

## 2021-08-12 VITALS — BP 136/78 | HR 87 | Temp 96.4°F | Resp 18 | Wt 98.8 lb

## 2021-08-12 VITALS — BP 114/63 | HR 72 | Temp 96.4°F | Resp 18

## 2021-08-12 DIAGNOSIS — C3411 Malignant neoplasm of upper lobe, right bronchus or lung: Secondary | ICD-10-CM | POA: Insufficient documentation

## 2021-08-12 DIAGNOSIS — Z923 Personal history of irradiation: Secondary | ICD-10-CM | POA: Diagnosis not present

## 2021-08-12 DIAGNOSIS — G893 Neoplasm related pain (acute) (chronic): Secondary | ICD-10-CM | POA: Insufficient documentation

## 2021-08-12 DIAGNOSIS — R634 Abnormal weight loss: Secondary | ICD-10-CM | POA: Diagnosis not present

## 2021-08-12 DIAGNOSIS — Z79899 Other long term (current) drug therapy: Secondary | ICD-10-CM | POA: Insufficient documentation

## 2021-08-12 DIAGNOSIS — C7951 Secondary malignant neoplasm of bone: Secondary | ICD-10-CM | POA: Insufficient documentation

## 2021-08-12 DIAGNOSIS — C7971 Secondary malignant neoplasm of right adrenal gland: Secondary | ICD-10-CM | POA: Diagnosis not present

## 2021-08-12 DIAGNOSIS — E032 Hypothyroidism due to medicaments and other exogenous substances: Secondary | ICD-10-CM

## 2021-08-12 DIAGNOSIS — R630 Anorexia: Secondary | ICD-10-CM | POA: Diagnosis not present

## 2021-08-12 DIAGNOSIS — Z9221 Personal history of antineoplastic chemotherapy: Secondary | ICD-10-CM | POA: Insufficient documentation

## 2021-08-12 DIAGNOSIS — Z5112 Encounter for antineoplastic immunotherapy: Secondary | ICD-10-CM | POA: Insufficient documentation

## 2021-08-12 DIAGNOSIS — E278 Other specified disorders of adrenal gland: Secondary | ICD-10-CM

## 2021-08-12 DIAGNOSIS — F17210 Nicotine dependence, cigarettes, uncomplicated: Secondary | ICD-10-CM | POA: Diagnosis not present

## 2021-08-12 DIAGNOSIS — R69 Illness, unspecified: Secondary | ICD-10-CM | POA: Diagnosis not present

## 2021-08-12 LAB — COMPREHENSIVE METABOLIC PANEL
ALT: 13 U/L (ref 0–44)
AST: 19 U/L (ref 15–41)
Albumin: 4.2 g/dL (ref 3.5–5.0)
Alkaline Phosphatase: 60 U/L (ref 38–126)
Anion gap: 7 (ref 5–15)
BUN: 10 mg/dL (ref 8–23)
CO2: 26 mmol/L (ref 22–32)
Calcium: 9.8 mg/dL (ref 8.9–10.3)
Chloride: 102 mmol/L (ref 98–111)
Creatinine, Ser: 1.2 mg/dL — ABNORMAL HIGH (ref 0.44–1.00)
GFR, Estimated: 50 mL/min — ABNORMAL LOW (ref >60)
Glucose, Bld: 97 mg/dL (ref 70–99)
Potassium: 4.1 mmol/L (ref 3.5–5.1)
Sodium: 135 mmol/L (ref 135–145)
Total Bilirubin: 0.4 mg/dL (ref 0.3–1.2)
Total Protein: 7.7 g/dL (ref 6.5–8.1)

## 2021-08-12 LAB — CBC WITH DIFFERENTIAL/PLATELET
Abs Immature Granulocytes: 0.04 10*3/uL (ref 0.00–0.07)
Basophils Absolute: 0 10*3/uL (ref 0.0–0.1)
Basophils Relative: 1 %
Eosinophils Absolute: 0.2 10*3/uL (ref 0.0–0.5)
Eosinophils Relative: 2 %
HCT: 43.2 % (ref 36.0–46.0)
Hemoglobin: 13.9 g/dL (ref 12.0–15.0)
Immature Granulocytes: 1 %
Lymphocytes Relative: 27 %
Lymphs Abs: 2.2 10*3/uL (ref 0.7–4.0)
MCH: 30.3 pg (ref 26.0–34.0)
MCHC: 32.2 g/dL (ref 30.0–36.0)
MCV: 94.1 fL (ref 80.0–100.0)
Monocytes Absolute: 0.6 10*3/uL (ref 0.1–1.0)
Monocytes Relative: 7 %
Neutro Abs: 5.2 10*3/uL (ref 1.7–7.7)
Neutrophils Relative %: 62 %
Platelets: 276 10*3/uL (ref 150–400)
RBC: 4.59 MIL/uL (ref 3.87–5.11)
RDW: 15.5 % (ref 11.5–15.5)
WBC: 8.3 10*3/uL (ref 4.0–10.5)
nRBC: 0 % (ref 0.0–0.2)

## 2021-08-12 LAB — TSH: TSH: 1.797 u[IU]/mL (ref 0.350–4.500)

## 2021-08-12 MED ORDER — DRONABINOL 2.5 MG PO CAPS: 2.5000 mg | ORAL_CAPSULE | Freq: Two times a day (BID) | ORAL | 5 refills | Status: DC

## 2021-08-12 MED ORDER — SODIUM CHLORIDE 0.9 % IV SOLN
480.0000 mg | Freq: Once | INTRAVENOUS | Status: AC
  Administered 2021-08-12: 480 mg via INTRAVENOUS
  Filled 2021-08-12: qty 48

## 2021-08-12 MED ORDER — SODIUM CHLORIDE 0.9 % IV SOLN: Freq: Once | INTRAVENOUS | Status: AC

## 2021-08-12 MED ORDER — HEPARIN SOD (PORK) LOCK FLUSH 100 UNIT/ML IV SOLN
500.0000 [IU] | Freq: Once | INTRAVENOUS | Status: AC | PRN
  Administered 2021-08-12: 500 [IU]

## 2021-08-12 MED ORDER — SODIUM CHLORIDE 0.9% FLUSH
10.0000 mL | INTRAVENOUS | Status: DC | PRN
Start: 2021-08-12 — End: 2021-08-12
  Administered 2021-08-12: 10 mL

## 2021-08-12 NOTE — Patient Instructions (Addendum)
[facility] [place] at [facility] Discharge Instructions  You were seen today by Dr. Delton Coombes. He went over your recent results and scans, and you received your treatment. Dr. Delton Coombes will see you back in 3 months for labs and follow up.   Thank you for choosing [facility] at [facility] to provide your oncology and hematology care.  To afford each patient quality time with our provider, please arrive at least 15 minutes before your scheduled appointment time.   If you have a lab appointment with the [facility] please come in thru the Main Entrance and check in at the main information desk  You need to re-schedule your appointment should you arrive 10 or more minutes late.  We strive to give you quality time with our providers, and arriving late affects you and other patients whose appointments are after yours.  Also, if you no show three or more times for appointments you may be dismissed from the clinic at the providers discretion.     Again, thank you for choosing [facility].  Our hope is that these requests will decrease the amount of time that you wait before being seen by our physicians.       _____________________________________________________________  Should you have questions after your visit to [facility], please contact our office at [phone] between the hours of 8:00 a.m. and 4:30 p.m.  Voicemails left after 4:00 p.m. will not be returned until the following business day.  For prescription refill requests, have your pharmacy contact our office and allow 72 hours.    Cancer Center Support Programs:   > Cancer Support Group  2nd Tuesday of the month 1pm-2pm, Journey Room

## 2021-08-12 NOTE — Progress Notes (Signed)
Patient presents today for chemotherapy infusion.  Patient is in satisfactory condition with no new complaints voiced.  Vital signs are stable.  Labs reviewed by Dr. Delton Coombes during her office visit.  We will proceed with treatment per MD orders.   Patient tolerated treatment well with no complaints voiced.  Patient left ambulatory in stable condition.  Vital signs stable at discharge.  Follow up as scheduled.

## 2021-08-12 NOTE — Patient Instructions (Signed)
[facility]  Discharge Instructions: Thank you for choosing [facility] to provide your oncology and hematology care.  If you have a lab appointment with the Cancer Center, please come in thru the Main Entrance and check in at the main information desk.  Wear comfortable clothing and clothing appropriate for easy access to any Portacath or PICC line.   We strive to give you quality time with your provider. You may need to reschedule your appointment if you arrive late (15 or more minutes).  Arriving late affects you and other patients whose appointments are after yours.  Also, if you miss three or more appointments without notifying the office, you may be dismissed from the clinic at the provider's discretion.      For prescription refill requests, have your pharmacy contact our office and allow 72 hours for refills to be completed.        To help prevent nausea and vomiting after your treatment, we encourage you to take your nausea medication as directed.  BELOW ARE SYMPTOMS THAT SHOULD BE REPORTED IMMEDIATELY: *FEVER GREATER THAN 100.4 F (38 C) OR HIGHER *CHILLS OR SWEATING *NAUSEA AND VOMITING THAT IS NOT CONTROLLED WITH YOUR NAUSEA MEDICATION *UNUSUAL SHORTNESS OF BREATH *UNUSUAL BRUISING OR BLEEDING *URINARY PROBLEMS (pain or burning when urinating, or frequent urination) *BOWEL PROBLEMS (unusual diarrhea, constipation, pain near the anus) TENDERNESS IN MOUTH AND THROAT WITH OR WITHOUT PRESENCE OF ULCERS (sore throat, sores in mouth, or a toothache) UNUSUAL RASH, SWELLING OR PAIN  UNUSUAL VAGINAL DISCHARGE OR ITCHING   Items with * indicate a potential emergency and should be followed up as soon as possible or go to the Emergency Department if any problems should occur.  Please show the CHEMOTHERAPY ALERT CARD or IMMUNOTHERAPY ALERT CARD at check-in to the Emergency Department and triage nurse.  Should you have questions after your visit or need to cancel  or reschedule your appointment, please contact [facility] [phone]  and follow the prompts.  Office hours are 8:00 a.m. to 4:30 p.m. Monday - Friday. Please note that voicemails left after 4:00 p.m. may not be returned until the following business day.  We are closed weekends and major holidays. You have access to a nurse at all times for urgent questions. Please call the main number to the clinic [phone] and follow the prompts.  For any non-urgent questions, you may also contact your provider using MyChart. We now offer e-Visits for anyone 18 and older to request care online for non-urgent symptoms. For details visit [email or web address].   Also download the MyChart app! Go to the app store, search "MyChart", open the app, select , and log in with your MyChart username and password.  Due to Covid, a mask is required upon entering the hospital/clinic. If you do not have a mask, one will be given to you upon arrival. For doctor visits, patients may have 1 support person aged 18 or older with them. For treatment visits, patients cannot have anyone with them due to current Covid guidelines and our immunocompromised population.  

## 2021-08-12 NOTE — Progress Notes (Signed)
Patient has been examined, vital signs and labs have been reviewed by Dr. Delton Coombes. ANC, Creatinine, LFTs, hemoglobin, and platelets are within treatment parameters per Dr. Delton Coombes. Patient is okay to proceed with treatment per M.D.

## 2021-08-13 ENCOUNTER — Encounter (HOSPITAL_COMMUNITY): Payer: Self-pay | Admitting: Hematology and Oncology

## 2021-08-20 DIAGNOSIS — K219 Gastro-esophageal reflux disease without esophagitis: Secondary | ICD-10-CM | POA: Diagnosis not present

## 2021-08-20 DIAGNOSIS — D649 Anemia, unspecified: Secondary | ICD-10-CM | POA: Diagnosis not present

## 2021-09-09 ENCOUNTER — Encounter (HOSPITAL_COMMUNITY): Payer: Self-pay

## 2021-09-09 ENCOUNTER — Inpatient Hospital Stay (HOSPITAL_COMMUNITY): Payer: Medicare HMO

## 2021-09-09 ENCOUNTER — Inpatient Hospital Stay (HOSPITAL_COMMUNITY): Payer: Medicare HMO | Attending: Hematology

## 2021-09-09 ENCOUNTER — Other Ambulatory Visit: Payer: Self-pay

## 2021-09-09 VITALS — BP 100/58 | HR 107 | Temp 99.0°F | Resp 18 | Wt 103.0 lb

## 2021-09-09 DIAGNOSIS — R634 Abnormal weight loss: Secondary | ICD-10-CM | POA: Diagnosis not present

## 2021-09-09 DIAGNOSIS — Z5112 Encounter for antineoplastic immunotherapy: Secondary | ICD-10-CM | POA: Insufficient documentation

## 2021-09-09 DIAGNOSIS — C3411 Malignant neoplasm of upper lobe, right bronchus or lung: Secondary | ICD-10-CM

## 2021-09-09 DIAGNOSIS — Z9221 Personal history of antineoplastic chemotherapy: Secondary | ICD-10-CM | POA: Diagnosis not present

## 2021-09-09 DIAGNOSIS — R69 Illness, unspecified: Secondary | ICD-10-CM | POA: Diagnosis not present

## 2021-09-09 DIAGNOSIS — C7951 Secondary malignant neoplasm of bone: Secondary | ICD-10-CM | POA: Insufficient documentation

## 2021-09-09 DIAGNOSIS — E278 Other specified disorders of adrenal gland: Secondary | ICD-10-CM

## 2021-09-09 DIAGNOSIS — F17210 Nicotine dependence, cigarettes, uncomplicated: Secondary | ICD-10-CM | POA: Diagnosis not present

## 2021-09-09 DIAGNOSIS — Z23 Encounter for immunization: Secondary | ICD-10-CM | POA: Diagnosis not present

## 2021-09-09 DIAGNOSIS — G893 Neoplasm related pain (acute) (chronic): Secondary | ICD-10-CM | POA: Diagnosis not present

## 2021-09-09 DIAGNOSIS — Z923 Personal history of irradiation: Secondary | ICD-10-CM | POA: Diagnosis not present

## 2021-09-09 DIAGNOSIS — C7971 Secondary malignant neoplasm of right adrenal gland: Secondary | ICD-10-CM | POA: Diagnosis not present

## 2021-09-09 DIAGNOSIS — Z79899 Other long term (current) drug therapy: Secondary | ICD-10-CM | POA: Diagnosis not present

## 2021-09-09 LAB — COMPREHENSIVE METABOLIC PANEL
ALT: 15 U/L (ref 0–44)
AST: 20 U/L (ref 15–41)
Albumin: 4.1 g/dL (ref 3.5–5.0)
Alkaline Phosphatase: 77 U/L (ref 38–126)
Anion gap: 7 (ref 5–15)
BUN: 12 mg/dL (ref 8–23)
CO2: 26 mmol/L (ref 22–32)
Calcium: 9.4 mg/dL (ref 8.9–10.3)
Chloride: 103 mmol/L (ref 98–111)
Creatinine, Ser: 1.31 mg/dL — ABNORMAL HIGH (ref 0.44–1.00)
GFR, Estimated: 45 mL/min — ABNORMAL LOW (ref >60)
Glucose, Bld: 106 mg/dL — ABNORMAL HIGH (ref 70–99)
Potassium: 4.2 mmol/L (ref 3.5–5.1)
Sodium: 136 mmol/L (ref 135–145)
Total Bilirubin: 0.5 mg/dL (ref 0.3–1.2)
Total Protein: 8.3 g/dL — ABNORMAL HIGH (ref 6.5–8.1)

## 2021-09-09 LAB — CBC WITH DIFFERENTIAL/PLATELET
Abs Immature Granulocytes: 0.04 10*3/uL (ref 0.00–0.07)
Basophils Absolute: 0 10*3/uL (ref 0.0–0.1)
Basophils Relative: 0 %
Eosinophils Absolute: 0.1 10*3/uL (ref 0.0–0.5)
Eosinophils Relative: 1 %
HCT: 44.2 % (ref 36.0–46.0)
Hemoglobin: 14.2 g/dL (ref 12.0–15.0)
Immature Granulocytes: 0 %
Lymphocytes Relative: 11 %
Lymphs Abs: 1.3 10*3/uL (ref 0.7–4.0)
MCH: 30.7 pg (ref 26.0–34.0)
MCHC: 32.1 g/dL (ref 30.0–36.0)
MCV: 95.7 fL (ref 80.0–100.0)
Monocytes Absolute: 0.8 10*3/uL (ref 0.1–1.0)
Monocytes Relative: 6 %
Neutro Abs: 10 10*3/uL — ABNORMAL HIGH (ref 1.7–7.7)
Neutrophils Relative %: 82 %
Platelets: 254 10*3/uL (ref 150–400)
RBC: 4.62 MIL/uL (ref 3.87–5.11)
RDW: 15.6 % — ABNORMAL HIGH (ref 11.5–15.5)
WBC: 12.2 10*3/uL — ABNORMAL HIGH (ref 4.0–10.5)
nRBC: 0 % (ref 0.0–0.2)

## 2021-09-09 LAB — TSH: TSH: 0.929 u[IU]/mL (ref 0.350–4.500)

## 2021-09-09 MED ORDER — HEPARIN SOD (PORK) LOCK FLUSH 100 UNIT/ML IV SOLN
500.0000 [IU] | Freq: Once | INTRAVENOUS | Status: AC | PRN
Start: 2021-09-09 — End: 2021-09-09
  Administered 2021-09-09: 500 [IU]

## 2021-09-09 MED ORDER — INFLUENZA VAC A&B SA ADJ QUAD 0.5 ML IM PRSY
0.5000 mL | PREFILLED_SYRINGE | Freq: Once | INTRAMUSCULAR | Status: AC
  Administered 2021-09-09: 0.5 mL via INTRAMUSCULAR
  Filled 2021-09-09: qty 0.5

## 2021-09-09 MED ORDER — SODIUM CHLORIDE 0.9 % IV SOLN
480.0000 mg | Freq: Once | INTRAVENOUS | Status: AC
  Administered 2021-09-09: 480 mg via INTRAVENOUS
  Filled 2021-09-09: qty 24

## 2021-09-09 MED ORDER — SODIUM CHLORIDE 0.9% FLUSH
10.0000 mL | INTRAVENOUS | Status: DC | PRN
  Administered 2021-09-09: 10 mL

## 2021-09-09 MED ORDER — SODIUM CHLORIDE 0.9 % IV SOLN: Freq: Once | INTRAVENOUS | Status: AC

## 2021-09-09 NOTE — Progress Notes (Signed)
Patient presents today for treatment. MAR reviewed and updated. Patient denies any changes since her last visit. Labs pending.   Labs within parameters for treatment. Heart rate 101 on arrival. Recheck Pulse 100.   Treatment given today per MD orders. Tolerated infusion without adverse affects. Vital signs stable. No complaints at this time. Discharged from clinic ambulatory in stable condition. Alert and oriented x 3. F/U with [facility] as scheduled.

## 2021-09-09 NOTE — Patient Instructions (Signed)
[facility]  Discharge Instructions: Thank you for choosing [facility] to provide your oncology and hematology care.  If you have a lab appointment with the [facility], please come in thru the Main Entrance and check in at the main information desk.  Wear comfortable clothing and clothing appropriate for easy access to any Portacath or PICC line.   We strive to give you quality time with your provider. You may need to reschedule your appointment if you arrive late (15 or more minutes).  Arriving late affects you and other patients whose appointments are after yours.  Also, if you miss three or more appointments without notifying the office, you may be dismissed from the clinic at the provider's discretion.      For prescription refill requests, have your pharmacy contact our office and allow 72 hours for refills to be completed.    Today you received the following chemotherapy and/or immunotherapy agents Opdivo.       To help prevent nausea and vomiting after your treatment, we encourage you to take your nausea medication as directed.  BELOW ARE SYMPTOMS THAT SHOULD BE REPORTED IMMEDIATELY: *FEVER GREATER THAN 100.4 F (38 C) OR HIGHER *CHILLS OR SWEATING *NAUSEA AND VOMITING THAT IS NOT CONTROLLED WITH YOUR NAUSEA MEDICATION *UNUSUAL SHORTNESS OF BREATH *UNUSUAL BRUISING OR BLEEDING *URINARY PROBLEMS (pain or burning when urinating, or frequent urination) *BOWEL PROBLEMS (unusual diarrhea, constipation, pain near the anus) TENDERNESS IN MOUTH AND THROAT WITH OR WITHOUT PRESENCE OF ULCERS (sore throat, sores in mouth, or a toothache) UNUSUAL RASH, SWELLING OR PAIN  UNUSUAL VAGINAL DISCHARGE OR ITCHING   Items with * indicate a potential emergency and should be followed up as soon as possible or go to the Emergency Department if any problems should occur.  Please show the CHEMOTHERAPY ALERT CARD or IMMUNOTHERAPY ALERT CARD at check-in to the Emergency  Department and triage nurse.  Should you have questions after your visit or need to cancel or reschedule your appointment, please contact [facility] [phone]  and follow the prompts.  Office hours are 8:00 a.m. to 4:30 p.m. Monday - Friday. Please note that voicemails left after 4:00 p.m. may not be returned until the following business day.  We are closed weekends and major holidays. You have access to a nurse at all times for urgent questions. Please call the main number to the clinic [phone] and follow the prompts.  For any non-urgent questions, you may also contact your provider using MyChart. We now offer e-Visits for anyone 13 and older to request care online for non-urgent symptoms. For details visit [email or web address].   Also download the MyChart app! Go to the app store, search "MyChart", open the app, select [facility], and log in with your MyChart username and password.  Due to Covid, a mask is required upon entering the hospital/clinic. If you do not have a mask, one will be given to you upon arrival. For doctor visits, patients may have 1 support person aged 21 or older with them. For treatment visits, patients cannot have anyone with them due to current Covid guidelines and our immunocompromised population.

## 2021-09-14 DIAGNOSIS — R059 Cough, unspecified: Secondary | ICD-10-CM | POA: Diagnosis not present

## 2021-09-14 DIAGNOSIS — J0100 Acute maxillary sinusitis, unspecified: Secondary | ICD-10-CM | POA: Diagnosis not present

## 2021-09-20 DIAGNOSIS — D649 Anemia, unspecified: Secondary | ICD-10-CM | POA: Diagnosis not present

## 2021-09-20 DIAGNOSIS — K219 Gastro-esophageal reflux disease without esophagitis: Secondary | ICD-10-CM | POA: Diagnosis not present

## 2021-10-07 ENCOUNTER — Inpatient Hospital Stay (HOSPITAL_COMMUNITY): Payer: Medicare HMO

## 2021-10-07 ENCOUNTER — Other Ambulatory Visit: Payer: Self-pay

## 2021-10-07 ENCOUNTER — Other Ambulatory Visit (HOSPITAL_COMMUNITY): Payer: Self-pay

## 2021-10-07 ENCOUNTER — Encounter (HOSPITAL_COMMUNITY): Payer: Self-pay

## 2021-10-07 ENCOUNTER — Inpatient Hospital Stay (HOSPITAL_COMMUNITY): Payer: Medicare HMO | Attending: Physician Assistant

## 2021-10-07 VITALS — BP 105/65 | HR 93 | Temp 97.6°F | Resp 18 | Wt 104.8 lb

## 2021-10-07 DIAGNOSIS — C7951 Secondary malignant neoplasm of bone: Secondary | ICD-10-CM | POA: Insufficient documentation

## 2021-10-07 DIAGNOSIS — Z79899 Other long term (current) drug therapy: Secondary | ICD-10-CM | POA: Diagnosis not present

## 2021-10-07 DIAGNOSIS — F17210 Nicotine dependence, cigarettes, uncomplicated: Secondary | ICD-10-CM | POA: Diagnosis not present

## 2021-10-07 DIAGNOSIS — C3411 Malignant neoplasm of upper lobe, right bronchus or lung: Secondary | ICD-10-CM | POA: Diagnosis present

## 2021-10-07 DIAGNOSIS — Z923 Personal history of irradiation: Secondary | ICD-10-CM | POA: Diagnosis not present

## 2021-10-07 DIAGNOSIS — G893 Neoplasm related pain (acute) (chronic): Secondary | ICD-10-CM | POA: Insufficient documentation

## 2021-10-07 DIAGNOSIS — C7971 Secondary malignant neoplasm of right adrenal gland: Secondary | ICD-10-CM | POA: Insufficient documentation

## 2021-10-07 DIAGNOSIS — Z5112 Encounter for antineoplastic immunotherapy: Secondary | ICD-10-CM | POA: Insufficient documentation

## 2021-10-07 DIAGNOSIS — R634 Abnormal weight loss: Secondary | ICD-10-CM | POA: Diagnosis not present

## 2021-10-07 DIAGNOSIS — R69 Illness, unspecified: Secondary | ICD-10-CM | POA: Diagnosis not present

## 2021-10-07 DIAGNOSIS — Z9221 Personal history of antineoplastic chemotherapy: Secondary | ICD-10-CM | POA: Diagnosis not present

## 2021-10-07 DIAGNOSIS — E278 Other specified disorders of adrenal gland: Secondary | ICD-10-CM

## 2021-10-07 LAB — CBC WITH DIFFERENTIAL/PLATELET
Abs Immature Granulocytes: 0.02 10*3/uL (ref 0.00–0.07)
Basophils Absolute: 0 10*3/uL (ref 0.0–0.1)
Basophils Relative: 0 %
Eosinophils Absolute: 0.2 10*3/uL (ref 0.0–0.5)
Eosinophils Relative: 3 %
HCT: 42.3 % (ref 36.0–46.0)
Hemoglobin: 13.9 g/dL (ref 12.0–15.0)
Immature Granulocytes: 0 %
Lymphocytes Relative: 27 %
Lymphs Abs: 2.4 10*3/uL (ref 0.7–4.0)
MCH: 31.2 pg (ref 26.0–34.0)
MCHC: 32.9 g/dL (ref 30.0–36.0)
MCV: 94.8 fL (ref 80.0–100.0)
Monocytes Absolute: 0.7 10*3/uL (ref 0.1–1.0)
Monocytes Relative: 8 %
Neutro Abs: 5.4 10*3/uL (ref 1.7–7.7)
Neutrophils Relative %: 62 %
Platelets: 249 10*3/uL (ref 150–400)
RBC: 4.46 MIL/uL (ref 3.87–5.11)
RDW: 15.9 % — ABNORMAL HIGH (ref 11.5–15.5)
WBC: 8.8 10*3/uL (ref 4.0–10.5)
nRBC: 0 % (ref 0.0–0.2)

## 2021-10-07 LAB — COMPREHENSIVE METABOLIC PANEL
ALT: 15 U/L (ref 0–44)
AST: 19 U/L (ref 15–41)
Albumin: 4.1 g/dL (ref 3.5–5.0)
Alkaline Phosphatase: 71 U/L (ref 38–126)
Anion gap: 8 (ref 5–15)
BUN: 15 mg/dL (ref 8–23)
CO2: 25 mmol/L (ref 22–32)
Calcium: 9.6 mg/dL (ref 8.9–10.3)
Chloride: 104 mmol/L (ref 98–111)
Creatinine, Ser: 1.26 mg/dL — ABNORMAL HIGH (ref 0.44–1.00)
GFR, Estimated: 47 mL/min — ABNORMAL LOW (ref >60)
Glucose, Bld: 101 mg/dL — ABNORMAL HIGH (ref 70–99)
Potassium: 4.4 mmol/L (ref 3.5–5.1)
Sodium: 137 mmol/L (ref 135–145)
Total Bilirubin: 0.5 mg/dL (ref 0.3–1.2)
Total Protein: 7.9 g/dL (ref 6.5–8.1)

## 2021-10-07 LAB — TSH: TSH: 2.109 u[IU]/mL (ref 0.350–4.500)

## 2021-10-07 MED ORDER — SODIUM CHLORIDE 0.9 % IV SOLN: Freq: Once | INTRAVENOUS | Status: AC

## 2021-10-07 MED ORDER — SODIUM CHLORIDE 0.9 % IV SOLN
480.0000 mg | Freq: Once | INTRAVENOUS | Status: AC
  Administered 2021-10-07: 10:00:00 480 mg via INTRAVENOUS
  Filled 2021-10-07: qty 48

## 2021-10-07 MED ORDER — SODIUM CHLORIDE 0.9% FLUSH
10.0000 mL | INTRAVENOUS | Status: DC | PRN
  Administered 2021-10-07: 10:00:00 10 mL

## 2021-10-07 MED ORDER — OXYCODONE HCL 10 MG PO TABS: 10.0000 mg | ORAL_TABLET | Freq: Two times a day (BID) | ORAL | 0 refills | Status: DC | PRN

## 2021-10-07 MED ORDER — HEPARIN SOD (PORK) LOCK FLUSH 100 UNIT/ML IV SOLN
500.0000 [IU] | Freq: Once | INTRAVENOUS | Status: AC | PRN
  Administered 2021-10-07: 10:00:00 500 [IU]

## 2021-10-07 NOTE — Patient Instructions (Signed)
[facility]  Discharge Instructions: Thank you for choosing [facility] to provide your oncology and hematology care.  If you have a lab appointment with the Cancer Center, please come in thru the Main Entrance and check in at the main information desk.  Wear comfortable clothing and clothing appropriate for easy access to any Portacath or PICC line.   We strive to give you quality time with your provider. You may need to reschedule your appointment if you arrive late (15 or more minutes).  Arriving late affects you and other patients whose appointments are after yours.  Also, if you miss three or more appointments without notifying the office, you may be dismissed from the clinic at the provider's discretion.      For prescription refill requests, have your pharmacy contact our office and allow 72 hours for refills to be completed.        To help prevent nausea and vomiting after your treatment, we encourage you to take your nausea medication as directed.  BELOW ARE SYMPTOMS THAT SHOULD BE REPORTED IMMEDIATELY: *FEVER GREATER THAN 100.4 F (38 C) OR HIGHER *CHILLS OR SWEATING *NAUSEA AND VOMITING THAT IS NOT CONTROLLED WITH YOUR NAUSEA MEDICATION *UNUSUAL SHORTNESS OF BREATH *UNUSUAL BRUISING OR BLEEDING *URINARY PROBLEMS (pain or burning when urinating, or frequent urination) *BOWEL PROBLEMS (unusual diarrhea, constipation, pain near the anus) TENDERNESS IN MOUTH AND THROAT WITH OR WITHOUT PRESENCE OF ULCERS (sore throat, sores in mouth, or a toothache) UNUSUAL RASH, SWELLING OR PAIN  UNUSUAL VAGINAL DISCHARGE OR ITCHING   Items with * indicate a potential emergency and should be followed up as soon as possible or go to the Emergency Department if any problems should occur.  Please show the CHEMOTHERAPY ALERT CARD or IMMUNOTHERAPY ALERT CARD at check-in to the Emergency Department and triage nurse.  Should you have questions after your visit or need to cancel  or reschedule your appointment, please contact [facility] [phone]  and follow the prompts.  Office hours are 8:00 a.m. to 4:30 p.m. Monday - Friday. Please note that voicemails left after 4:00 p.m. may not be returned until the following business day.  We are closed weekends and major holidays. You have access to a nurse at all times for urgent questions. Please call the main number to the clinic [phone] and follow the prompts.  For any non-urgent questions, you may also contact your provider using MyChart. We now offer e-Visits for anyone 18 and older to request care online for non-urgent symptoms. For details visit [email or web address].   Also download the MyChart app! Go to the app store, search "MyChart", open the app, select [facility], and log in with your MyChart username and password.  Due to Covid, a mask is required upon entering the hospital/clinic. If you do not have a mask, one will be given to you upon arrival. For doctor visits, patients may have 1 support person aged 18 or older with them. For treatment visits, patients cannot have anyone with them due to current Covid guidelines and our immunocompromised population.  

## 2021-10-07 NOTE — Progress Notes (Signed)
Labs meet parameters for treatment today.   Treatment given per orders. Patient tolerated it well without problems. Vitals stable and discharged home from clinic ambulatory. Follow up as scheduled.

## 2021-10-21 DIAGNOSIS — R7301 Impaired fasting glucose: Secondary | ICD-10-CM | POA: Diagnosis not present

## 2021-10-21 DIAGNOSIS — E782 Mixed hyperlipidemia: Secondary | ICD-10-CM | POA: Diagnosis not present

## 2021-10-22 DIAGNOSIS — G629 Polyneuropathy, unspecified: Secondary | ICD-10-CM | POA: Diagnosis not present

## 2021-10-22 DIAGNOSIS — B351 Tinea unguium: Secondary | ICD-10-CM | POA: Diagnosis not present

## 2021-10-25 DIAGNOSIS — C3490 Malignant neoplasm of unspecified part of unspecified bronchus or lung: Secondary | ICD-10-CM | POA: Diagnosis not present

## 2021-10-25 DIAGNOSIS — E782 Mixed hyperlipidemia: Secondary | ICD-10-CM | POA: Diagnosis not present

## 2021-10-25 DIAGNOSIS — I700 Atherosclerosis of aorta: Secondary | ICD-10-CM | POA: Diagnosis not present

## 2021-10-25 DIAGNOSIS — N1831 Chronic kidney disease, stage 3a: Secondary | ICD-10-CM | POA: Diagnosis not present

## 2021-10-25 DIAGNOSIS — Z0001 Encounter for general adult medical examination with abnormal findings: Secondary | ICD-10-CM | POA: Diagnosis not present

## 2021-10-25 DIAGNOSIS — R946 Abnormal results of thyroid function studies: Secondary | ICD-10-CM | POA: Diagnosis not present

## 2021-10-25 DIAGNOSIS — K219 Gastro-esophageal reflux disease without esophagitis: Secondary | ICD-10-CM | POA: Diagnosis not present

## 2021-10-25 DIAGNOSIS — R7301 Impaired fasting glucose: Secondary | ICD-10-CM | POA: Diagnosis not present

## 2021-10-25 DIAGNOSIS — J4520 Mild intermittent asthma, uncomplicated: Secondary | ICD-10-CM | POA: Diagnosis not present

## 2021-11-03 NOTE — Progress Notes (Signed)
Melissa Sullivan [address], [place] [address]   CLINIC:  Medical Oncology/Hematology  PCP:  [name], MD [address] Liana Crocker [place] [address] [phone]   REASON FOR VISIT:  Follow-up for metastatic right lung cancer  PRIOR THERAPY:  1. Bevacizumab, cisplatin and pemetrexed x 4 cycles from 09/02/2014 to 11/04/2014. 2. Bevacizumab and pemetrexed x 3 cycles from 12/24/2014 to 02/04/2015.  NGS Results: not done  CURRENT THERAPY: Nivolumab every 4 weeks  BRIEF ONCOLOGIC HISTORY:  Oncology History  Cancer of upper lobe of right lung ([place])  07/28/2014 Imaging   CT chest: Large R apical mass consistent with malignancy. This is destroying the R 2nd rib with extension into adjacent soft tissue. R hilar adenopathy with R 5cm adrenal metastatic lesion.   08/01/2014 Initial Biopsy   Lung, needle/core biopsy(ies), right upper lobe - POORLY DIFFERENTIATED ADENOCARCINOMA, SEE COMMENT.   08/08/2014 PET scan   Large hypermetabolic R apical mass with evidence of direct chest wall and mediastinal invasion, right retrocrural lymphadenopathy, extensive retroperitoneal lymphadenopathy, and metastatic lesions to the adrenal glands    09/02/2014 - 11/04/2014 Chemotherapy   Cisplatin/Pemetrexed/Avastin every 21 days x 4 cycles   10/07/2014 - 10/27/2014 Radiation Therapy   Right lung apex for control of brachioplexopathy.   12/24/2014 - 02/25/2015 Chemotherapy   Alimta/Avastin every 21 days.   02/20/2015 Imaging   Increase in size of right adrenal metastasis and subjacent confluent retrocaval lymphadenopathy   02/25/2015 -  Chemotherapy   Nivolumab, zometa   05/04/2015 Imaging   CT CAP- Stable to slight decrease in the posterior right apical lesion. Stable appearance of posterior right upper rib an upper thoracic bony lesions. Slight improvement in right upper lobe tree-in-bud opacity. No new or progressive findings in...   07/28/2015 Imaging   CT CAP- Reduced size of the  right apical pleural parenchymal lesion and reduced size of the right adrenal metastatic lesion. Resolution of prior retrocrural adenopathy.  Right eccentric T1 and T2 sclerosis with sclerosis and tapering of the right second..   11/17/2015 Imaging   CT CAP- Stable soft tissue thickening in the apex of the right hemi thorax. Stable right adrenal metastasis. Nodularity along the trachea and mainstem bronchi, relatively new from 07/28/2015, favoring adherent debris.   11/18/2015 Treatment Plan Change   Zometa HELD for upcoming tooth extraction   11/24/2015 Treatment Plan Change   Zometa on hold at this time in preparation for tooth extraction in March 2017.  Zometa las given on 11/18/2015.   02/03/2016 Imaging   CT CAP- Heterogeneous right apical masslike consolidation and right adrenal metastasis are unchanged   04/06/2016 Treatment Plan Change   Zometa restarted 6 weeks out from tooth extraction (04/06/2016)   05/12/2016 Imaging   CT CAP- NED in the chest, abdomen or pelvis.  Some areas of nodularity associated with the mainstem bronchi in the left upper lobe bronchus, favored to represent adherent inspissated secretions   08/17/2016 Imaging   CT CAP- 1. Stable CTs of the chest and abdomen. No evidence of progressive metastatic disease. 2. Probable treated tumor at the right apex, right adrenal gland and T2 vertebral body, stable. 3. Fluctuating nodularity along the walls of the trachea and mainstem bronchi, likely secretions.   12/12/2016 Imaging   Further decrease in size of treated tumor within the right apex. 2. Stable treated tumor involving the right second rib and T2 vertebra. 3. Stable right adrenal gland treated tumor. 4. Emphysema 5. Aortic atherosclerosis  01/04/2017 Imaging   MRI brain- Normal brain MRI.  No intracranial metastatic disease.   03/15/2017 Imaging   CT CAP- 1. No new or progressive metastatic disease in the chest or abdomen. 2. Stable treated tumor in the  apical right upper lobe. Stable treated right posterior second rib and right T2 vertebral lesions. Stable treated right adrenal metastasis. 3. Aortic atherosclerosis. 4. Moderate emphysema with mild diffuse bronchial wall thickening, suggesting COPD.   06/12/2017 Imaging   CT CAP 1. Similar appearance of treated primary within the right apex. 2. Similar areas of sclerosis within the right second rib, T2, and less so T1 vertebral bodies. These are most consistent with treated metastasis. 3. Similar right adrenal treated metastasis. 4. No evidence of new or progressive disease. 5. Similar right and progressive left areas of bronchial wall thickening and probable mucoid impaction. Correlate with interval infectious symptoms. 6.  Emphysema (ICD10-J43.9). 7. Coronary artery atherosclerosis. Aortic Atherosclerosis (ICD10-I70.0).     10/16/2017 Imaging   CT CAP 1. Stable appearance of the prior [place] tumor and related bony findings in the right second rib and right T1 and T2 vertebra compatible with successfully treated tumor. No significant enlargement or new lesions are identified. Similarly the treated right adrenal metastatic lesion is stable in appearance. 2. Upper normal size right hilar lymph node may warrant surveillance. Currently 9 mm in short axis. 3. Other imaging findings of potential clinical significance: Aortic Atherosclerosis (ICD10-I70.0) and Emphysema (ICD10-J43.9). Scattered proximal sigmoid colon diverticula. Mucus plugging medially in the left upper lobe and posteriorly in the right upper lobe.       CANCER STAGING:  Cancer Staging  No matching staging information was found for the patient.  INTERVAL HISTORY:  Melissa Sullivan, a 65 y.o. female, returns for routine follow-up and consideration for next cycle of chemotherapy. Keven was last seen on 08/12/2021.  Due for cycle #52 of Nivolumab today.   Overall, she tells me she has been feeling  pretty well. She reports intermittent cramping in her hands lasting for intervals of 2-3 minutes throughout the day; this causes her to drop objects if she is holding one at the onset of the cramp. She drinks about 12 ounces of water daily along with cranberry juice. She denies recent infections and ankle swellings.   Overall, she feels ready for next cycle of chemo today.   REVIEW OF SYSTEMS:  Review of Systems  Constitutional:  Negative for appetite change and fatigue.  Respiratory:  Positive for shortness of breath.   Cardiovascular:  Negative for leg swelling.  Musculoskeletal:  Positive for myalgias (cramping in hands).  Psychiatric/Behavioral:  Positive for sleep disturbance.   All other systems reviewed and are negative.  PAST MEDICAL/SURGICAL HISTORY:  Past Medical History:  Diagnosis Date   Adrenal mass, right ([place]) 07/28/2014   Anemia    Bone metastases ([place]) 04/05/2016   Cancer ([place]) 2015   lung  right   Diabetes mellitus without complication ([place])    GERD (gastroesophageal reflux disease)    Hyperlipidemia    Hypertension    Hypothyroidism due to medication 01/30/2017   Lung mass 07/28/2014   Reflux    Past Surgical History:  Procedure Laterality Date   AMPUTATION Right 02/22/2017   Procedure: PARTIAL AMPUTATION RIGHT GREAT TOE;  Surgeon: Caprice Beaver, DPM;  Location: [facility];  Service: Podiatry;  Laterality: Right;   APPENDECTOMY     BIOPSY  09/22/2020   Procedure: BIOPSY;  Surgeon: Eloise Harman, DO;  Location:  [facility];  Service: Endoscopy;;   COLONOSCOPY WITH PROPOFOL N/A 09/22/2020   non-bleeding internal hemorrhoids, sigmoid and descending colon diverticulosis, localized mild inflammation in sigmoid that was felt due to prep artifact.    ESOPHAGOGASTRODUODENOSCOPY N/A 12/09/2014   KKX:FGHWEX esophageal stricture/mild-to-noderate erosive gastritis. negative H.pylori   FLEXIBLE SIGMOIDOSCOPY  2011   Dr. [name]: hyperplastic polyp   LUNG BIOPSY Right 07/2014    CT guided   MALONEY DILATION N/A 12/09/2014   Procedure: MALONEY DILATION;  Surgeon: [name], MD;  Location: [facility];  Service: Endoscopy;  Laterality: N/A;   PORTACATH PLACEMENT Left 09/01/2014   SAVORY DILATION N/A 12/09/2014   Procedure: SAVORY DILATION;  Surgeon: [name], MD;  Location: [facility];  Service: Endoscopy;  Laterality: N/A;    SOCIAL HISTORY:  Social History   Socioeconomic History   Marital status: Married    Spouse name: Not on file   Number of children: Not on file   Years of education: Not on file   Highest education level: Not on file  Occupational History   Not on file  Tobacco Use   Smoking status: Light Smoker    Packs/day: 0.30    Years: 20.00    Pack years: 6.00    Types: Cigarettes   Smokeless tobacco: Never  Vaping Use   Vaping Use: Never used  Substance and Sexual Activity   Alcohol use: No   Drug use: No   Sexual activity: Not on file  Other Topics Concern   Not on file  Social History Narrative   Not on file   Social Determinants of Health   Financial Resource Strain: Low Risk    Difficulty of Paying Living Expenses: Not hard at all  Food Insecurity: No Food Insecurity   Worried About Charity fundraiser in the Last Year: Never true   Ran Out of Food in the Last Year: Never true  Transportation Needs: No Transportation Needs   Lack of Transportation (Medical): No   Lack of Transportation (Non-Medical): No  Physical Activity: Not on file  Stress: No Stress Concern Present   Feeling of Stress : Only a little  Social Connections: Moderately Isolated   Frequency of Communication with Friends and Family: More than three times a week   Frequency of Social Gatherings with Friends and Family: More than three times a week   Attends Religious Services: Never   Marine scientist or Organizations: No   Attends Music therapist: Never   Marital Status: Married  Human resources officer Violence: Not At Risk    Fear of Current or Ex-Partner: No   Emotionally Abused: No   Physically Abused: No   Sexually Abused: No    FAMILY HISTORY:  Family History  Problem Relation Age of Onset   Cancer Sister    Colon cancer Neg Hx     CURRENT MEDICATIONS:  Current Outpatient Medications  Medication Sig Dispense Refill   albuterol (VENTOLIN HFA) 108 (90 Base) MCG/ACT inhaler Inhale 2 puffs into the lungs every 6 (six) hours as needed for wheezing or shortness of breath.     dexlansoprazole (DEXILANT) 60 MG capsule Dexilant 60 mg capsule, delayed release  TAKE 1 CAPSULE BY MOUTH IN THE MORNING WITH BREAKFAST     dronabinol (MARINOL) 2.5 MG capsule Take 1 capsule (2.5 mg total) by mouth 2 (two) times daily before lunch and supper. 60 capsule 5   ENSURE (ENSURE) Take 1 Can by mouth  4 (four) times daily.     Ferrous Sulfate (IRON) 325 (65 Fe) MG TABS Take 325 mg by mouth daily.      Fluticasone-Salmeterol (ADVAIR DISKUS) 500-50 MCG/DOSE AEPB Inhale 1 puff into the lungs 2 (two) times daily. 60 each 6   levocetirizine (XYZAL) 5 MG tablet Take 5 mg by mouth every evening.      levothyroxine (SYNTHROID) 25 MCG tablet TAKE 1/2 (ONE-HALF) TABLET BY MOUTH ONCE DAILY BEFORE BREAKFAST 45 tablet 0   Nivolumab (OPDIVO IV) Inject into the vein every 28 (twenty-eight) days.     omeprazole (PRILOSEC) 40 MG capsule omeprazole 40 mg capsule,delayed release  Take one capsule by mouth once daily about 30 minutes before breakfast     Oxycodone HCl 10 MG TABS Take 1 tablet (10 mg total) by mouth every 12 (twelve) hours as needed (pain.). 60 tablet 0   pantoprazole (PROTONIX) 40 MG tablet Take 1 tablet (40 mg total) by mouth daily. 30 minutes before breakfast 90 tablet 3   pseudoephedrine-guaifenesin (MUCINEX D) 60-600 MG 12 hr tablet Take 1 tablet by mouth daily as needed for congestion.     rosuvastatin (CRESTOR) 20 MG tablet Take 20 mg by mouth daily.     simvastatin (ZOCOR) 40 MG tablet simvastatin 40 mg tablet  TAKE 1  TABLET BY MOUTH ONCE DAILY     lidocaine-prilocaine (EMLA) cream Apply 1 application topically as needed (port access). (Patient not taking: Reported on 11/04/2021)     naloxone [facility]) nasal spray 4 mg/0.1 mL naloxone 4 mg/actuation nasal spray (Patient not taking: Reported on 11/04/2021)     promethazine (PHENERGAN) 6.25 MG/5ML syrup promethazine 6.25 mg/5 mL oral syrup  Take 5 mL 3 times a day by oral route. (Patient not taking: Reported on 11/04/2021)     No current facility-administered medications for this visit.   Facility-Administered Medications Ordered in Other Visits  Medication Dose Route Frequency Provider Last Rate Last Admin   sodium chloride flush (NS) 0.9 % injection 10 mL  10 mL Intracatheter PRN Derek Jack, MD   10 mL at 01/03/19 0930    ALLERGIES:  Allergies  Allergen Reactions   Xgeva [Denosumab]     osteonecrosis    PHYSICAL EXAM:  Performance status (ECOG): 0 - Asymptomatic  Vitals:   11/04/21 1010  BP: 132/73  Pulse: 85  Resp: 18  Temp: 97.8 F (36.6 C)  SpO2: 96%   Wt Readings from Last 3 Encounters:  11/04/21 106 lb 3.2 oz (48.2 kg)  10/07/21 104 lb 12.8 oz (47.5 kg)  09/09/21 103 lb (46.7 kg)   Physical Exam Vitals reviewed.  Constitutional:      Appearance: Normal appearance.  Cardiovascular:     Rate and Rhythm: Normal rate and regular rhythm.     Pulses: Normal pulses.     Heart sounds: Normal heart sounds.  Pulmonary:     Effort: Pulmonary effort is normal.     Breath sounds: Normal breath sounds.  Neurological:     General: No focal deficit present.     Mental Status: She is alert and oriented to person, place, and time.  Psychiatric:        Mood and Affect: Mood normal.        Behavior: Behavior normal.    LABORATORY DATA:  I have reviewed the labs as listed.  CBC Latest Ref Rng & Units 11/04/2021 10/07/2021 09/09/2021  WBC 4.0 - 10.5 K/uL 8.6 8.8 12.2(H)  Hemoglobin 12.0 - 15.0 g/dL 14.2  13.9 14.2  Hematocrit  36.0 - 46.0 % 43.2 42.3 44.2  Platelets 150 - 400 K/uL 271 249 254   CMP Latest Ref Rng & Units 11/04/2021 10/07/2021 09/09/2021  Glucose 70 - 99 mg/dL 87 101(H) 106(H)  BUN 8 - 23 mg/dL 17 15 12   Creatinine 0.44 - 1.00 mg/dL 1.22(H) 1.26(H) 1.31(H)  Sodium 135 - 145 mmol/L 138 137 136  Potassium 3.5 - 5.1 mmol/L 3.8 4.4 4.2  Chloride 98 - 111 mmol/L 102 104 103  CO2 22 - 32 mmol/L 26 25 26   Calcium 8.9 - 10.3 mg/dL 10.0 9.6 9.4  Total Protein 6.5 - 8.1 g/dL 8.1 7.9 8.3(H)  Total Bilirubin 0.3 - 1.2 mg/dL 0.4 0.5 0.5  Alkaline Phos 38 - 126 U/L 66 71 77  AST 15 - 41 U/L 21 19 20   ALT 0 - 44 U/L 18 15 15     DIAGNOSTIC IMAGING:  I have independently reviewed the scans and discussed with the patient. No results found.   ASSESSMENT:  1. Cancer of upper lobe of right lung [facility]) Metastatic adenocarcinoma of the lung to the bones and right adrenal: -Foundation 1 testing shows 14 genomic alterations, no approved therapies. -Opdivo 480 mg monthly started on February 25, 2015. -CT CAP on February 21, 2020 shows no evidence of progression or metastatic disease.  T2 sclerotic lesion is stable. -CTAP on 08/10/2020 with no evidence of recurrent metastatic disease in the abdomen or pelvis.  Irregular mildly enlarged right adrenal gland unchanged. -CT chest on 09/09/2020 showed partial clearing of the airspace opacities noted medially in the lingula and left lower lobe.  New irregular nodule posteriorly at the right apex likely inflammatory.  Stable sclerotic T2 vertebral body lesion. -Reviewed CT CAP from 02/19/2021 which showed stable to slight decrease in size of mediastinal lymph nodes.  Stable posttreatment changes in the left lung apex and slight decrease in size of the absent irregular nodule.  Stable sclerotic lesion in T2 vertebral body.  No new progressive findings.   PLAN:  1.  Metastatic lung adenocarcinoma: - CT CAP on 08/11/2021 showed stable posttreatment changes in the right upper hemithorax.   No evidence of recurrence or metastatic disease.  Findings are COPD, cholelithiasis and colonic diverticulosis. - She does not report any immunotherapy related side effects. - She reports cramping in the right hand and feet occasionally. - We reviewed her electrolytes including potassium and magnesium which are within normal limits.  LFTs are normal.  CBC was normal.  TSH was 2.1. - Recommend treatment today and monthly with nivolumab. - RTC 3 months for follow-up.  I plan to repeat CT CAP with contrast prior to next visit.   2.  High risk drug monitoring: - Continue Synthroid 25 mcg daily.  TSH today is 2.1.   3.  Health maintenance: - Mammogram on 06/28/2021, BI-RADS Category 1.   4.  CKD: - Baseline creatinine around 1.2-1.3.  Today creatinine 1.22.   5.  Bone metastasis: - Bisphosphonates are held due to ONJ in 2019.  6.  Chronic pains: - Continue oxycodone as needed.  7.  Weight loss: - Continue Marinol 2.5 mg twice daily.  She is gaining weight.  She is also drinking boost.   Orders placed this encounter:  Orders Placed This Encounter  Procedures   Magnesium      Derek Jack, MD [place] [phone]   I, Thana Ates, am acting as a scribe for Dr. Derek Jack.  Kinnie Scales  MD, have reviewed the above documentation for accuracy and completeness, and I agree with the above.

## 2021-11-04 ENCOUNTER — Inpatient Hospital Stay (HOSPITAL_COMMUNITY): Payer: Medicare HMO

## 2021-11-04 ENCOUNTER — Other Ambulatory Visit: Payer: Self-pay

## 2021-11-04 ENCOUNTER — Inpatient Hospital Stay (HOSPITAL_COMMUNITY): Payer: Medicare HMO | Attending: Hematology | Admitting: Hematology

## 2021-11-04 VITALS — BP 113/72 | HR 80 | Temp 97.8°F | Resp 18

## 2021-11-04 VITALS — BP 132/73 | HR 85 | Temp 97.8°F | Resp 18 | Ht 59.0 in | Wt 106.2 lb

## 2021-11-04 DIAGNOSIS — R634 Abnormal weight loss: Secondary | ICD-10-CM | POA: Insufficient documentation

## 2021-11-04 DIAGNOSIS — F17210 Nicotine dependence, cigarettes, uncomplicated: Secondary | ICD-10-CM | POA: Diagnosis not present

## 2021-11-04 DIAGNOSIS — Z79899 Other long term (current) drug therapy: Secondary | ICD-10-CM | POA: Insufficient documentation

## 2021-11-04 DIAGNOSIS — R630 Anorexia: Secondary | ICD-10-CM | POA: Diagnosis not present

## 2021-11-04 DIAGNOSIS — C7971 Secondary malignant neoplasm of right adrenal gland: Secondary | ICD-10-CM | POA: Insufficient documentation

## 2021-11-04 DIAGNOSIS — C3411 Malignant neoplasm of upper lobe, right bronchus or lung: Secondary | ICD-10-CM

## 2021-11-04 DIAGNOSIS — G8929 Other chronic pain: Secondary | ICD-10-CM | POA: Diagnosis not present

## 2021-11-04 DIAGNOSIS — Z5112 Encounter for antineoplastic immunotherapy: Secondary | ICD-10-CM | POA: Insufficient documentation

## 2021-11-04 DIAGNOSIS — Z79891 Long term (current) use of opiate analgesic: Secondary | ICD-10-CM | POA: Diagnosis not present

## 2021-11-04 DIAGNOSIS — K8020 Calculus of gallbladder without cholecystitis without obstruction: Secondary | ICD-10-CM | POA: Insufficient documentation

## 2021-11-04 DIAGNOSIS — Z7951 Long term (current) use of inhaled steroids: Secondary | ICD-10-CM | POA: Insufficient documentation

## 2021-11-04 DIAGNOSIS — J439 Emphysema, unspecified: Secondary | ICD-10-CM | POA: Diagnosis not present

## 2021-11-04 DIAGNOSIS — R69 Illness, unspecified: Secondary | ICD-10-CM | POA: Diagnosis not present

## 2021-11-04 DIAGNOSIS — I129 Hypertensive chronic kidney disease with stage 1 through stage 4 chronic kidney disease, or unspecified chronic kidney disease: Secondary | ICD-10-CM | POA: Diagnosis not present

## 2021-11-04 DIAGNOSIS — N189 Chronic kidney disease, unspecified: Secondary | ICD-10-CM | POA: Diagnosis not present

## 2021-11-04 DIAGNOSIS — C7951 Secondary malignant neoplasm of bone: Secondary | ICD-10-CM | POA: Diagnosis not present

## 2021-11-04 DIAGNOSIS — E278 Other specified disorders of adrenal gland: Secondary | ICD-10-CM

## 2021-11-04 LAB — CBC WITH DIFFERENTIAL/PLATELET
Abs Immature Granulocytes: 0.02 K/uL (ref 0.00–0.07)
Basophils Absolute: 0 K/uL (ref 0.0–0.1)
Basophils Relative: 1 %
Eosinophils Absolute: 0.2 K/uL (ref 0.0–0.5)
Eosinophils Relative: 2 %
HCT: 43.2 % (ref 36.0–46.0)
Hemoglobin: 14.2 g/dL (ref 12.0–15.0)
Immature Granulocytes: 0 %
Lymphocytes Relative: 32 %
Lymphs Abs: 2.8 K/uL (ref 0.7–4.0)
MCH: 31.5 pg (ref 26.0–34.0)
MCHC: 32.9 g/dL (ref 30.0–36.0)
MCV: 95.8 fL (ref 80.0–100.0)
Monocytes Absolute: 0.8 K/uL (ref 0.1–1.0)
Monocytes Relative: 9 %
Neutro Abs: 4.8 K/uL (ref 1.7–7.7)
Neutrophils Relative %: 56 %
Platelets: 271 K/uL (ref 150–400)
RBC: 4.51 MIL/uL (ref 3.87–5.11)
RDW: 15.7 % — ABNORMAL HIGH (ref 11.5–15.5)
WBC: 8.6 K/uL (ref 4.0–10.5)
nRBC: 0 % (ref 0.0–0.2)

## 2021-11-04 LAB — COMPREHENSIVE METABOLIC PANEL
ALT: 18 U/L (ref 0–44)
AST: 21 U/L (ref 15–41)
Albumin: 4.3 g/dL (ref 3.5–5.0)
Alkaline Phosphatase: 66 U/L (ref 38–126)
Anion gap: 10 (ref 5–15)
BUN: 17 mg/dL (ref 8–23)
CO2: 26 mmol/L (ref 22–32)
Calcium: 10 mg/dL (ref 8.9–10.3)
Chloride: 102 mmol/L (ref 98–111)
Creatinine, Ser: 1.22 mg/dL — ABNORMAL HIGH (ref 0.44–1.00)
GFR, Estimated: 49 mL/min — ABNORMAL LOW (ref >60)
Glucose, Bld: 87 mg/dL (ref 70–99)
Potassium: 3.8 mmol/L (ref 3.5–5.1)
Sodium: 138 mmol/L (ref 135–145)
Total Bilirubin: 0.4 mg/dL (ref 0.3–1.2)
Total Protein: 8.1 g/dL (ref 6.5–8.1)

## 2021-11-04 LAB — MAGNESIUM: Magnesium: 2.1 mg/dL (ref 1.7–2.4)

## 2021-11-04 LAB — TSH: TSH: 2.194 u[IU]/mL (ref 0.350–4.500)

## 2021-11-04 MED ORDER — SODIUM CHLORIDE 0.9 % IV SOLN
480.0000 mg | Freq: Once | INTRAVENOUS | Status: AC
  Administered 2021-11-04: 480 mg via INTRAVENOUS
  Filled 2021-11-04: qty 48

## 2021-11-04 MED ORDER — HEPARIN SOD (PORK) LOCK FLUSH 100 UNIT/ML IV SOLN
500.0000 [IU] | Freq: Once | INTRAVENOUS | Status: AC | PRN
  Administered 2021-11-04: 500 [IU]

## 2021-11-04 MED ORDER — SODIUM CHLORIDE 0.9% FLUSH
10.0000 mL | INTRAVENOUS | Status: DC | PRN
  Administered 2021-11-04 (×2): 10 mL

## 2021-11-04 MED ORDER — SODIUM CHLORIDE 0.9 % IV SOLN: Freq: Once | INTRAVENOUS | Status: AC

## 2021-11-04 NOTE — Patient Instructions (Signed)
[place] at [facility] Discharge Instructions   You were seen and examined today by Dr. Delton Coombes. He reviewed you lab work which is normal/stable. We will check a magnesium level to see if this may be causing your cramping.  We will proceed with treatment today and every 4 weeks. Return as scheduled in 3 months.    Thank you for choosing [place] at [facility] to provide your oncology and hematology care.  To afford each patient quality time with our provider, please arrive at least 15 minutes before your scheduled appointment time.   If you have a lab appointment with the [facility] please come in thru the Main Entrance and check in at the main information desk.  You need to re-schedule your appointment should you arrive 10 or more minutes late.  We strive to give you quality time with our providers, and arriving late affects you and other patients whose appointments are after yours.  Also, if you no show three or more times for appointments you may be dismissed from the clinic at the providers discretion.     Again, thank you for choosing [facility].  Our hope is that these requests will decrease the amount of time that you wait before being seen by our physicians.       _____________________________________________________________  Should you have questions after your visit to [facility], please contact our office at [phone] and follow the prompts.  Our office hours are 8:00 a.m. and 4:30 p.m. Monday - Friday.  Please note that voicemails left after 4:00 p.m. may not be returned until the following business day.  We are closed weekends and major holidays.  You do have access to a nurse 24-7, just call the main number to the clinic [phone] and do not press any options, hold on the line and a nurse will answer the phone.    For prescription refill requests, have your pharmacy contact our office and  allow 72 hours.    Due to Covid, you will need to wear a mask upon entering the hospital. If you do not have a mask, a mask will be given to you at the Main Entrance upon arrival. For doctor visits, patients may have 1 support person age 66 or older with them. For treatment visits, patients can not have anyone with them due to social distancing guidelines and our immunocompromised population.

## 2021-11-04 NOTE — Progress Notes (Signed)
Patient has been examined by Dr. Katragadda, and vital signs and labs have been reviewed. ANC, Creatinine, LFTs, hemoglobin, and platelets are within treatment parameters per M.D. - pt may proceed with treatment.    °

## 2021-11-04 NOTE — Progress Notes (Signed)
Pt presents today for Opdivo per provider's order. Vital signs and labs WNL for treatment today. Okay to proceed with treatment today per Dr.K. Pt voiced no new complaints at this time.  Opdivo given today per MD orders. Tolerated infusion without adverse affects. Vital signs stable. No complaints at this time. Discharged from clinic ambulatory in stable condition. Alert and oriented x 3. F/U with [facility] as scheduled.

## 2021-11-04 NOTE — Patient Instructions (Signed)
[place]  Discharge Instructions: Thank you for choosing [facility] to provide your oncology and hematology care.  If you have a lab appointment with the [facility], please come in thru the Main Entrance and check in at the main information desk.  Wear comfortable clothing and clothing appropriate for easy access to any Portacath or PICC line.   We strive to give you quality time with your provider. You may need to reschedule your appointment if you arrive late (15 or more minutes).  Arriving late affects you and other patients whose appointments are after yours.  Also, if you miss three or more appointments without notifying the office, you may be dismissed from the clinic at the providers discretion.      For prescription refill requests, have your pharmacy contact our office and allow 72 hours for refills to be completed.    Today you received the following chemotherapy and/or immunotherapy agents Opdivo.   To help prevent nausea and vomiting after your treatment, we encourage you to take your nausea medication as directed.  BELOW ARE SYMPTOMS THAT SHOULD BE REPORTED IMMEDIATELY: *FEVER GREATER THAN 100.4 F (38 C) OR HIGHER *CHILLS OR SWEATING *NAUSEA AND VOMITING THAT IS NOT CONTROLLED WITH YOUR NAUSEA MEDICATION *UNUSUAL SHORTNESS OF BREATH *UNUSUAL BRUISING OR BLEEDING *URINARY PROBLEMS (pain or burning when urinating, or frequent urination) *BOWEL PROBLEMS (unusual diarrhea, constipation, pain near the anus) TENDERNESS IN MOUTH AND THROAT WITH OR WITHOUT PRESENCE OF ULCERS (sore throat, sores in mouth, or a toothache) UNUSUAL RASH, SWELLING OR PAIN  UNUSUAL VAGINAL DISCHARGE OR ITCHING   Items with * indicate a potential emergency and should be followed up as soon as possible or go to the Emergency Department if any problems should occur.  Please show the CHEMOTHERAPY ALERT CARD or IMMUNOTHERAPY ALERT CARD at check-in to the Emergency Department  and triage nurse.  Should you have questions after your visit or need to cancel or reschedule your appointment, please contact [facility] [phone]  and follow the prompts.  Office hours are 8:00 a.m. to 4:30 p.m. Monday - Friday. Please note that voicemails left after 4:00 p.m. may not be returned until the following business day.  We are closed weekends and major holidays. You have access to a nurse at all times for urgent questions. Please call the main number to the clinic [phone] and follow the prompts.  For any non-urgent questions, you may also contact your provider using MyChart. We now offer e-Visits for anyone 48 and older to request care online for non-urgent symptoms. For details visit [email or web address].   Also download the MyChart app! Go to the app store, search "MyChart", open the app, select [place], and log in with your MyChart username and password.  Due to Covid, a mask is required upon entering the hospital/clinic. If you do not have a mask, one will be given to you upon arrival. For doctor visits, patients may have 1 support person aged 85 or older with them. For treatment visits, patients cannot have anyone with them due to current Covid guidelines and our immunocompromised population.

## 2021-11-12 ENCOUNTER — Other Ambulatory Visit (HOSPITAL_COMMUNITY): Payer: Self-pay | Admitting: Hematology

## 2021-11-12 DIAGNOSIS — E032 Hypothyroidism due to medicaments and other exogenous substances: Secondary | ICD-10-CM

## 2021-11-15 ENCOUNTER — Encounter (HOSPITAL_COMMUNITY): Payer: Self-pay | Admitting: Hematology and Oncology

## 2021-12-02 ENCOUNTER — Inpatient Hospital Stay (HOSPITAL_COMMUNITY): Payer: Medicare HMO | Attending: Hematology

## 2021-12-02 ENCOUNTER — Other Ambulatory Visit: Payer: Self-pay

## 2021-12-02 ENCOUNTER — Encounter (HOSPITAL_COMMUNITY): Payer: Self-pay

## 2021-12-02 ENCOUNTER — Inpatient Hospital Stay (HOSPITAL_COMMUNITY): Payer: Medicare HMO

## 2021-12-02 ENCOUNTER — Ambulatory Visit (HOSPITAL_COMMUNITY)
Admission: RE | Admit: 2021-12-02 | Discharge: 2021-12-02 | Disposition: A | Discharge status: Home / Self Care | Payer: Medicare HMO | Source: Ambulatory Visit | Attending: Hematology | Admitting: Hematology

## 2021-12-02 VITALS — BP 116/69 | HR 85 | Temp 97.4°F | Resp 18 | Ht 59.84 in | Wt 106.2 lb

## 2021-12-02 DIAGNOSIS — C3411 Malignant neoplasm of upper lobe, right bronchus or lung: Secondary | ICD-10-CM

## 2021-12-02 DIAGNOSIS — E278 Other specified disorders of adrenal gland: Secondary | ICD-10-CM

## 2021-12-02 DIAGNOSIS — D649 Anemia, unspecified: Secondary | ICD-10-CM | POA: Insufficient documentation

## 2021-12-02 DIAGNOSIS — T82828A Fibrosis of vascular prosthetic devices, implants and grafts, initial encounter: Secondary | ICD-10-CM | POA: Diagnosis not present

## 2021-12-02 DIAGNOSIS — Z79899 Other long term (current) drug therapy: Secondary | ICD-10-CM | POA: Diagnosis not present

## 2021-12-02 LAB — COMPREHENSIVE METABOLIC PANEL
ALT: 21 U/L (ref 0–44)
AST: 25 U/L (ref 15–41)
Albumin: 4.3 g/dL (ref 3.5–5.0)
Alkaline Phosphatase: 67 U/L (ref 38–126)
Anion gap: 6 (ref 5–15)
BUN: 13 mg/dL (ref 8–23)
CO2: 27 mmol/L (ref 22–32)
Calcium: 9.8 mg/dL (ref 8.9–10.3)
Chloride: 104 mmol/L (ref 98–111)
Creatinine, Ser: 1.22 mg/dL — ABNORMAL HIGH (ref 0.44–1.00)
GFR, Estimated: 49 mL/min — ABNORMAL LOW (ref >60)
Glucose, Bld: 89 mg/dL (ref 70–99)
Potassium: 4.2 mmol/L (ref 3.5–5.1)
Sodium: 137 mmol/L (ref 135–145)
Total Bilirubin: 0.3 mg/dL (ref 0.3–1.2)
Total Protein: 7.9 g/dL (ref 6.5–8.1)

## 2021-12-02 LAB — CBC WITH DIFFERENTIAL/PLATELET
Abs Immature Granulocytes: 0 10*3/uL (ref 0.00–0.07)
Basophils Absolute: 0 10*3/uL (ref 0.0–0.1)
Basophils Relative: 1 %
Eosinophils Absolute: 0.4 10*3/uL (ref 0.0–0.5)
Eosinophils Relative: 12 %
HCT: 26.6 % — ABNORMAL LOW (ref 36.0–46.0)
Hemoglobin: 8.9 g/dL — ABNORMAL LOW (ref 12.0–15.0)
Immature Granulocytes: 0 %
Lymphocytes Relative: 24 %
Lymphs Abs: 0.8 10*3/uL (ref 0.7–4.0)
MCH: 36.3 pg — ABNORMAL HIGH (ref 26.0–34.0)
MCHC: 33.5 g/dL (ref 30.0–36.0)
MCV: 108.6 fL — ABNORMAL HIGH (ref 80.0–100.0)
Monocytes Absolute: 0.5 10*3/uL (ref 0.1–1.0)
Monocytes Relative: 13 %
Neutro Abs: 1.7 10*3/uL (ref 1.7–7.7)
Neutrophils Relative %: 50 %
Platelets: 321 10*3/uL (ref 150–400)
RBC: 2.45 MIL/uL — ABNORMAL LOW (ref 3.87–5.11)
RDW: 14.4 % (ref 11.5–15.5)
WBC: 3.4 10*3/uL — ABNORMAL LOW (ref 4.0–10.5)
nRBC: 0 % (ref 0.0–0.2)

## 2021-12-02 LAB — VITAMIN B12: Vitamin B-12: 1325 pg/mL — ABNORMAL HIGH (ref 180–914)

## 2021-12-02 LAB — FOLATE: Folate: 34.6 ng/mL (ref >5.9)

## 2021-12-02 LAB — IRON AND TIBC
Iron: 50 ug/dL (ref 28–170)
Saturation Ratios: 17 % (ref 10.4–31.8)
TIBC: 286 ug/dL (ref 250–450)
UIBC: 236 ug/dL

## 2021-12-02 LAB — HEMOGLOBIN AND HEMATOCRIT, BLOOD
HCT: 41.6 % (ref 36.0–46.0)
Hemoglobin: 13.6 g/dL (ref 12.0–15.0)

## 2021-12-02 LAB — TSH: TSH: 1.682 u[IU]/mL (ref 0.350–4.500)

## 2021-12-02 LAB — FERRITIN: Ferritin: 28 ng/mL (ref 11–307)

## 2021-12-02 MED ORDER — SODIUM CHLORIDE (PF) 0.9 % IJ SOLN
INTRAMUSCULAR | Status: AC
  Administered 2021-12-02: 10 mL via INTRAVENOUS
  Filled 2021-12-02: qty 50

## 2021-12-02 MED ORDER — SODIUM CHLORIDE 0.9 % IV SOLN: Freq: Once | INTRAVENOUS | Status: AC

## 2021-12-02 MED ORDER — SODIUM CHLORIDE 0.9 % IV SOLN
480.0000 mg | Freq: Once | INTRAVENOUS | Status: AC
  Administered 2021-12-02: 480 mg via INTRAVENOUS
  Filled 2021-12-02: qty 48

## 2021-12-02 MED ORDER — SODIUM CHLORIDE 0.9% FLUSH
10.0000 mL | INTRAVENOUS | Status: DC | PRN
  Administered 2021-12-02: 10 mL

## 2021-12-02 MED ORDER — HEPARIN SOD (PORK) LOCK FLUSH 100 UNIT/ML IV SOLN
500.0000 [IU] | Freq: Once | INTRAVENOUS | Status: AC | PRN
  Administered 2021-12-02: 500 [IU]

## 2021-12-02 NOTE — Progress Notes (Signed)
Dye study done today. Reb. Minter RN reported to RN  that port is ok to use per Dr. Thornton Papas the Radiologist. Labs reviewed, ok to proceed per Dr. Delton Coombes.   Treatment given per orders. Patient tolerated it well without problems. Vitals stable and discharged home from clinic ambulatory. Follow up as scheduled.

## 2021-12-02 NOTE — Patient Instructions (Signed)
[place]  Discharge Instructions: Thank you for choosing [facility] to provide your oncology and hematology care.  If you have a lab appointment with the [facility], please come in thru the Main Entrance and check in at the main information desk.  Wear comfortable clothing and clothing appropriate for easy access to any Portacath or PICC line.   We strive to give you quality time with your provider. You may need to reschedule your appointment if you arrive late (15 or more minutes).  Arriving late affects you and other patients whose appointments are after yours.  Also, if you miss three or more appointments without notifying the office, you may be dismissed from the clinic at the providers discretion.      For prescription refill requests, have your pharmacy contact our office and allow 72 hours for refills to be completed.    Today you received the following chemotherapy and/or immunotherapy agents   To help prevent nausea and vomiting after your treatment, we encourage you to take your nausea medication as directed.  BELOW ARE SYMPTOMS THAT SHOULD BE REPORTED IMMEDIATELY: *FEVER GREATER THAN 100.4 F (38 C) OR HIGHER *CHILLS OR SWEATING *NAUSEA AND VOMITING THAT IS NOT CONTROLLED WITH YOUR NAUSEA MEDICATION *UNUSUAL SHORTNESS OF BREATH *UNUSUAL BRUISING OR BLEEDING *URINARY PROBLEMS (pain or burning when urinating, or frequent urination) *BOWEL PROBLEMS (unusual diarrhea, constipation, pain near the anus) TENDERNESS IN MOUTH AND THROAT WITH OR WITHOUT PRESENCE OF ULCERS (sore throat, sores in mouth, or a toothache) UNUSUAL RASH, SWELLING OR PAIN  UNUSUAL VAGINAL DISCHARGE OR ITCHING   Items with * indicate a potential emergency and should be followed up as soon as possible or go to the Emergency Department if any problems should occur.  Please show the CHEMOTHERAPY ALERT CARD or IMMUNOTHERAPY ALERT CARD at check-in to the Emergency Department and  triage nurse.  Should you have questions after your visit or need to cancel or reschedule your appointment, please contact [facility] [phone]  and follow the prompts.  Office hours are 8:00 a.m. to 4:30 p.m. Monday - Friday. Please note that voicemails left after 4:00 p.m. may not be returned until the following business day.  We are closed weekends and major holidays. You have access to a nurse at all times for urgent questions. Please call the main number to the clinic [phone] and follow the prompts.  For any non-urgent questions, you may also contact your provider using MyChart. We now offer e-Visits for anyone 4 and older to request care online for non-urgent symptoms. For details visit [email or web address].   Also download the MyChart app! Go to the app store, search "MyChart", open the app, select [place], and log in with your MyChart username and password.  Due to Covid, a mask is required upon entering the hospital/clinic. If you do not have a mask, one will be given to you upon arrival. For doctor visits, patients may have 1 support person aged 12 or older with them. For treatment visits, patients cannot have anyone with them due to current Covid guidelines and our immunocompromised population.

## 2021-12-07 ENCOUNTER — Other Ambulatory Visit: Payer: Self-pay | Admitting: *Deleted

## 2021-12-07 DIAGNOSIS — H524 Presbyopia: Secondary | ICD-10-CM | POA: Diagnosis not present

## 2021-12-07 DIAGNOSIS — Z0100 Encounter for examination of eyes and vision without abnormal findings: Secondary | ICD-10-CM | POA: Diagnosis not present

## 2021-12-07 MED ORDER — LIDOCAINE-PRILOCAINE 2.5-2.5 % EX CREA: 1.0000 "application " | TOPICAL_CREAM | CUTANEOUS | 3 refills | Status: DC | PRN

## 2021-12-08 ENCOUNTER — Other Ambulatory Visit (HOSPITAL_COMMUNITY): Payer: Self-pay

## 2021-12-08 DIAGNOSIS — D649 Anemia, unspecified: Secondary | ICD-10-CM | POA: Insufficient documentation

## 2021-12-08 LAB — OCCULT BLOOD X 1 CARD TO LAB, STOOL
Fecal Occult Bld: NEGATIVE
Fecal Occult Bld: NEGATIVE
Fecal Occult Bld: NEGATIVE

## 2021-12-21 ENCOUNTER — Encounter (HOSPITAL_COMMUNITY): Payer: Self-pay | Admitting: Emergency Medicine

## 2021-12-21 DIAGNOSIS — I10 Essential (primary) hypertension: Secondary | ICD-10-CM | POA: Diagnosis not present

## 2021-12-21 DIAGNOSIS — E782 Mixed hyperlipidemia: Secondary | ICD-10-CM | POA: Diagnosis not present

## 2021-12-21 NOTE — Progress Notes (Signed)
PA completed for Dronabinol 2.5 mg through [facility].  Starting 11/21/2021 through 06/19/2022.  Reference # [id number]. [facility] notified and prescription filled.

## 2021-12-24 DIAGNOSIS — Z7951 Long term (current) use of inhaled steroids: Secondary | ICD-10-CM | POA: Diagnosis not present

## 2021-12-24 DIAGNOSIS — T451X5D Adverse effect of antineoplastic and immunosuppressive drugs, subsequent encounter: Secondary | ICD-10-CM | POA: Diagnosis not present

## 2021-12-24 DIAGNOSIS — E039 Hypothyroidism, unspecified: Secondary | ICD-10-CM | POA: Diagnosis not present

## 2021-12-24 DIAGNOSIS — I739 Peripheral vascular disease, unspecified: Secondary | ICD-10-CM | POA: Diagnosis not present

## 2021-12-24 DIAGNOSIS — J309 Allergic rhinitis, unspecified: Secondary | ICD-10-CM | POA: Diagnosis not present

## 2021-12-24 DIAGNOSIS — J449 Chronic obstructive pulmonary disease, unspecified: Secondary | ICD-10-CM | POA: Diagnosis not present

## 2021-12-24 DIAGNOSIS — E785 Hyperlipidemia, unspecified: Secondary | ICD-10-CM | POA: Diagnosis not present

## 2021-12-24 DIAGNOSIS — E46 Unspecified protein-calorie malnutrition: Secondary | ICD-10-CM | POA: Diagnosis not present

## 2021-12-24 DIAGNOSIS — C3490 Malignant neoplasm of unspecified part of unspecified bronchus or lung: Secondary | ICD-10-CM | POA: Diagnosis not present

## 2021-12-24 DIAGNOSIS — Z008 Encounter for other general examination: Secondary | ICD-10-CM | POA: Diagnosis not present

## 2021-12-24 DIAGNOSIS — F17210 Nicotine dependence, cigarettes, uncomplicated: Secondary | ICD-10-CM | POA: Diagnosis not present

## 2021-12-24 DIAGNOSIS — K219 Gastro-esophageal reflux disease without esophagitis: Secondary | ICD-10-CM | POA: Diagnosis not present

## 2021-12-24 DIAGNOSIS — D84821 Immunodeficiency due to drugs: Secondary | ICD-10-CM | POA: Diagnosis not present

## 2021-12-30 ENCOUNTER — Inpatient Hospital Stay (HOSPITAL_COMMUNITY): Payer: Medicare HMO

## 2021-12-30 ENCOUNTER — Encounter (HOSPITAL_COMMUNITY): Payer: Self-pay | Admitting: Hematology and Oncology

## 2021-12-30 ENCOUNTER — Other Ambulatory Visit: Payer: Self-pay

## 2021-12-30 ENCOUNTER — Inpatient Hospital Stay (HOSPITAL_COMMUNITY): Payer: Medicare HMO | Attending: Hematology

## 2021-12-30 ENCOUNTER — Encounter (HOSPITAL_COMMUNITY): Payer: Self-pay

## 2021-12-30 VITALS — BP 96/57 | HR 82 | Temp 98.7°F | Resp 18 | Ht 59.0 in | Wt 102.4 lb

## 2021-12-30 DIAGNOSIS — G629 Polyneuropathy, unspecified: Secondary | ICD-10-CM | POA: Diagnosis not present

## 2021-12-30 DIAGNOSIS — K8020 Calculus of gallbladder without cholecystitis without obstruction: Secondary | ICD-10-CM | POA: Insufficient documentation

## 2021-12-30 DIAGNOSIS — R634 Abnormal weight loss: Secondary | ICD-10-CM | POA: Diagnosis not present

## 2021-12-30 DIAGNOSIS — B351 Tinea unguium: Secondary | ICD-10-CM | POA: Diagnosis not present

## 2021-12-30 DIAGNOSIS — J439 Emphysema, unspecified: Secondary | ICD-10-CM | POA: Diagnosis not present

## 2021-12-30 DIAGNOSIS — N189 Chronic kidney disease, unspecified: Secondary | ICD-10-CM | POA: Diagnosis not present

## 2021-12-30 DIAGNOSIS — F17210 Nicotine dependence, cigarettes, uncomplicated: Secondary | ICD-10-CM | POA: Insufficient documentation

## 2021-12-30 DIAGNOSIS — G8929 Other chronic pain: Secondary | ICD-10-CM | POA: Diagnosis not present

## 2021-12-30 DIAGNOSIS — Z79891 Long term (current) use of opiate analgesic: Secondary | ICD-10-CM | POA: Insufficient documentation

## 2021-12-30 DIAGNOSIS — C7971 Secondary malignant neoplasm of right adrenal gland: Secondary | ICD-10-CM | POA: Insufficient documentation

## 2021-12-30 DIAGNOSIS — C3411 Malignant neoplasm of upper lobe, right bronchus or lung: Secondary | ICD-10-CM | POA: Diagnosis not present

## 2021-12-30 DIAGNOSIS — E278 Other specified disorders of adrenal gland: Secondary | ICD-10-CM

## 2021-12-30 DIAGNOSIS — C7951 Secondary malignant neoplasm of bone: Secondary | ICD-10-CM | POA: Diagnosis not present

## 2021-12-30 DIAGNOSIS — I129 Hypertensive chronic kidney disease with stage 1 through stage 4 chronic kidney disease, or unspecified chronic kidney disease: Secondary | ICD-10-CM | POA: Insufficient documentation

## 2021-12-30 DIAGNOSIS — Z79899 Other long term (current) drug therapy: Secondary | ICD-10-CM | POA: Diagnosis not present

## 2021-12-30 DIAGNOSIS — Z5112 Encounter for antineoplastic immunotherapy: Secondary | ICD-10-CM | POA: Insufficient documentation

## 2021-12-30 LAB — CBC WITH DIFFERENTIAL/PLATELET
Abs Immature Granulocytes: 0.03 10*3/uL (ref 0.00–0.07)
Basophils Absolute: 0 10*3/uL (ref 0.0–0.1)
Basophils Relative: 0 %
Eosinophils Absolute: 0.1 10*3/uL (ref 0.0–0.5)
Eosinophils Relative: 2 %
HCT: 43 % (ref 36.0–46.0)
Hemoglobin: 14.2 g/dL (ref 12.0–15.0)
Immature Granulocytes: 0 %
Lymphocytes Relative: 27 %
Lymphs Abs: 2.2 10*3/uL (ref 0.7–4.0)
MCH: 31.3 pg (ref 26.0–34.0)
MCHC: 33 g/dL (ref 30.0–36.0)
MCV: 94.9 fL (ref 80.0–100.0)
Monocytes Absolute: 0.6 10*3/uL (ref 0.1–1.0)
Monocytes Relative: 8 %
Neutro Abs: 5.1 10*3/uL (ref 1.7–7.7)
Neutrophils Relative %: 63 %
Platelets: 260 10*3/uL (ref 150–400)
RBC: 4.53 MIL/uL (ref 3.87–5.11)
RDW: 15.1 % (ref 11.5–15.5)
WBC: 8.1 10*3/uL (ref 4.0–10.5)
nRBC: 0 % (ref 0.0–0.2)

## 2021-12-30 LAB — TSH: TSH: 1.875 u[IU]/mL (ref 0.350–4.500)

## 2021-12-30 LAB — COMPREHENSIVE METABOLIC PANEL
ALT: 23 U/L (ref 0–44)
AST: 25 U/L (ref 15–41)
Albumin: 4.3 g/dL (ref 3.5–5.0)
Alkaline Phosphatase: 59 U/L (ref 38–126)
Anion gap: 6 (ref 5–15)
BUN: 15 mg/dL (ref 8–23)
CO2: 28 mmol/L (ref 22–32)
Calcium: 9.8 mg/dL (ref 8.9–10.3)
Chloride: 102 mmol/L (ref 98–111)
Creatinine, Ser: 1.24 mg/dL — ABNORMAL HIGH (ref 0.44–1.00)
GFR, Estimated: 48 mL/min — ABNORMAL LOW (ref >60)
Glucose, Bld: 100 mg/dL — ABNORMAL HIGH (ref 70–99)
Potassium: 3.9 mmol/L (ref 3.5–5.1)
Sodium: 136 mmol/L (ref 135–145)
Total Bilirubin: 0.6 mg/dL (ref 0.3–1.2)
Total Protein: 7.8 g/dL (ref 6.5–8.1)

## 2021-12-30 MED ORDER — SODIUM CHLORIDE 0.9 % IV SOLN: Freq: Once | INTRAVENOUS | Status: AC

## 2021-12-30 MED ORDER — SODIUM CHLORIDE 0.9 % IV SOLN
480.0000 mg | Freq: Once | INTRAVENOUS | Status: AC
  Administered 2021-12-30: 480 mg via INTRAVENOUS
  Filled 2021-12-30: qty 48

## 2021-12-30 MED ORDER — HEPARIN SOD (PORK) LOCK FLUSH 100 UNIT/ML IV SOLN
500.0000 [IU] | Freq: Once | INTRAVENOUS | Status: AC | PRN
  Administered 2021-12-30: 500 [IU]

## 2021-12-30 MED ORDER — SODIUM CHLORIDE 0.9% FLUSH
10.0000 mL | INTRAVENOUS | Status: DC | PRN
  Administered 2021-12-30: 10 mL

## 2021-12-30 NOTE — Progress Notes (Signed)
Patient presents today for Opdivo infusion. Vital signs within parameters for treatment. Labs reviewed and within parameters for treatment today. Patient has no complaints of any changes since her last visit. Patient denies any side effects from treatment.   Treatment given today per MD orders. Tolerated infusion without adverse affects. Vital signs stable. No complaints at this time. Discharged from clinic ambulatory in stable condition. Alert and oriented x 3. F/U with [facility] as scheduled.

## 2021-12-30 NOTE — Patient Instructions (Signed)
[place]  Discharge Instructions: Thank you for choosing [facility] to provide your oncology and hematology care.  If you have a lab appointment with the [facility], please come in thru the Main Entrance and check in at the main information desk.  Wear comfortable clothing and clothing appropriate for easy access to any Portacath or PICC line.   We strive to give you quality time with your provider. You may need to reschedule your appointment if you arrive late (15 or more minutes).  Arriving late affects you and other patients whose appointments are after yours.  Also, if you miss three or more appointments without notifying the office, you may be dismissed from the clinic at the providers discretion.      For prescription refill requests, have your pharmacy contact our office and allow 72 hours for refills to be completed.    Today you received the following Opidivo.       To help prevent nausea and vomiting after your treatment, we encourage you to take your nausea medication as directed.  BELOW ARE SYMPTOMS THAT SHOULD BE REPORTED IMMEDIATELY: *FEVER GREATER THAN 100.4 F (38 C) OR HIGHER *CHILLS OR SWEATING *NAUSEA AND VOMITING THAT IS NOT CONTROLLED WITH YOUR NAUSEA MEDICATION *UNUSUAL SHORTNESS OF BREATH *UNUSUAL BRUISING OR BLEEDING *URINARY PROBLEMS (pain or burning when urinating, or frequent urination) *BOWEL PROBLEMS (unusual diarrhea, constipation, pain near the anus) TENDERNESS IN MOUTH AND THROAT WITH OR WITHOUT PRESENCE OF ULCERS (sore throat, sores in mouth, or a toothache) UNUSUAL RASH, SWELLING OR PAIN  UNUSUAL VAGINAL DISCHARGE OR ITCHING   Items with * indicate a potential emergency and should be followed up as soon as possible or go to the Emergency Department if any problems should occur.  Please show the CHEMOTHERAPY ALERT CARD or IMMUNOTHERAPY ALERT CARD at check-in to the Emergency Department and triage nurse.  Should you have  questions after your visit or need to cancel or reschedule your appointment, please contact [facility] [phone]  and follow the prompts.  Office hours are 8:00 a.m. to 4:30 p.m. Monday - Friday. Please note that voicemails left after 4:00 p.m. may not be returned until the following business day.  We are closed weekends and major holidays. You have access to a nurse at all times for urgent questions. Please call the main number to the clinic [phone] and follow the prompts.  For any non-urgent questions, you may also contact your provider using MyChart. We now offer e-Visits for anyone 40 and older to request care online for non-urgent symptoms. For details visit [email or web address].   Also download the MyChart app! Go to the app store, search "MyChart", open the app, select [place], and log in with your MyChart username and password.  Due to Covid, a mask is required upon entering the hospital/clinic. If you do not have a mask, one will be given to you upon arrival. For doctor visits, patients may have 1 support person aged 35 or older with them. For treatment visits, patients cannot have anyone with them due to current Covid guidelines and our immunocompromised population.

## 2022-01-21 ENCOUNTER — Other Ambulatory Visit: Payer: Self-pay

## 2022-01-21 ENCOUNTER — Encounter (HOSPITAL_COMMUNITY): Payer: Self-pay

## 2022-01-21 ENCOUNTER — Ambulatory Visit (HOSPITAL_COMMUNITY)
Admission: RE | Admit: 2022-01-21 | Discharge: 2022-01-21 | Disposition: A | Discharge status: Home / Self Care | Payer: Medicare HMO | Source: Ambulatory Visit | Attending: Hematology | Admitting: Hematology

## 2022-01-21 DIAGNOSIS — K5730 Diverticulosis of large intestine without perforation or abscess without bleeding: Secondary | ICD-10-CM | POA: Diagnosis not present

## 2022-01-21 DIAGNOSIS — C3411 Malignant neoplasm of upper lobe, right bronchus or lung: Secondary | ICD-10-CM | POA: Diagnosis not present

## 2022-01-21 DIAGNOSIS — J479 Bronchiectasis, uncomplicated: Secondary | ICD-10-CM | POA: Diagnosis not present

## 2022-01-21 DIAGNOSIS — J432 Centrilobular emphysema: Secondary | ICD-10-CM | POA: Diagnosis not present

## 2022-01-21 DIAGNOSIS — Z85118 Personal history of other malignant neoplasm of bronchus and lung: Secondary | ICD-10-CM | POA: Diagnosis not present

## 2022-01-21 DIAGNOSIS — J929 Pleural plaque without asbestos: Secondary | ICD-10-CM | POA: Diagnosis not present

## 2022-01-21 DIAGNOSIS — I700 Atherosclerosis of aorta: Secondary | ICD-10-CM | POA: Diagnosis not present

## 2022-01-21 MED ORDER — IOHEXOL 300 MG/ML  SOLN
80.0000 mL | Freq: Once | INTRAMUSCULAR | Status: AC | PRN
  Administered 2022-01-21: 80 mL via INTRAVENOUS

## 2022-01-21 MED ORDER — HEPARIN SOD (PORK) LOCK FLUSH 100 UNIT/ML IV SOLN
INTRAVENOUS | Status: AC
  Filled 2022-01-21: qty 5

## 2022-01-25 NOTE — Progress Notes (Signed)
Has follow up 01/27/22

## 2022-01-27 ENCOUNTER — Other Ambulatory Visit (HOSPITAL_COMMUNITY): Payer: Self-pay | Admitting: *Deleted

## 2022-01-27 ENCOUNTER — Inpatient Hospital Stay (HOSPITAL_BASED_OUTPATIENT_CLINIC_OR_DEPARTMENT_OTHER): Payer: Medicare HMO | Admitting: Hematology

## 2022-01-27 ENCOUNTER — Inpatient Hospital Stay (HOSPITAL_COMMUNITY): Payer: Medicare HMO

## 2022-01-27 ENCOUNTER — Other Ambulatory Visit: Payer: Self-pay

## 2022-01-27 ENCOUNTER — Inpatient Hospital Stay (HOSPITAL_COMMUNITY): Payer: Medicare HMO | Attending: Hematology

## 2022-01-27 VITALS — BP 142/89 | HR 97 | Temp 96.8°F | Resp 18 | Ht 59.0 in | Wt 104.2 lb

## 2022-01-27 VITALS — BP 129/68 | HR 79 | Temp 97.7°F | Resp 18

## 2022-01-27 DIAGNOSIS — J439 Emphysema, unspecified: Secondary | ICD-10-CM | POA: Insufficient documentation

## 2022-01-27 DIAGNOSIS — N189 Chronic kidney disease, unspecified: Secondary | ICD-10-CM | POA: Diagnosis not present

## 2022-01-27 DIAGNOSIS — Z5112 Encounter for antineoplastic immunotherapy: Secondary | ICD-10-CM | POA: Diagnosis not present

## 2022-01-27 DIAGNOSIS — F17210 Nicotine dependence, cigarettes, uncomplicated: Secondary | ICD-10-CM | POA: Insufficient documentation

## 2022-01-27 DIAGNOSIS — Z79890 Hormone replacement therapy (postmenopausal): Secondary | ICD-10-CM | POA: Insufficient documentation

## 2022-01-27 DIAGNOSIS — C7971 Secondary malignant neoplasm of right adrenal gland: Secondary | ICD-10-CM | POA: Insufficient documentation

## 2022-01-27 DIAGNOSIS — C7951 Secondary malignant neoplasm of bone: Secondary | ICD-10-CM | POA: Insufficient documentation

## 2022-01-27 DIAGNOSIS — G8929 Other chronic pain: Secondary | ICD-10-CM | POA: Insufficient documentation

## 2022-01-27 DIAGNOSIS — K8020 Calculus of gallbladder without cholecystitis without obstruction: Secondary | ICD-10-CM | POA: Diagnosis not present

## 2022-01-27 DIAGNOSIS — Z79891 Long term (current) use of opiate analgesic: Secondary | ICD-10-CM | POA: Insufficient documentation

## 2022-01-27 DIAGNOSIS — C3411 Malignant neoplasm of upper lobe, right bronchus or lung: Secondary | ICD-10-CM

## 2022-01-27 DIAGNOSIS — I129 Hypertensive chronic kidney disease with stage 1 through stage 4 chronic kidney disease, or unspecified chronic kidney disease: Secondary | ICD-10-CM | POA: Diagnosis not present

## 2022-01-27 DIAGNOSIS — E278 Other specified disorders of adrenal gland: Secondary | ICD-10-CM

## 2022-01-27 DIAGNOSIS — R630 Anorexia: Secondary | ICD-10-CM

## 2022-01-27 DIAGNOSIS — Z79899 Other long term (current) drug therapy: Secondary | ICD-10-CM | POA: Diagnosis not present

## 2022-01-27 LAB — CBC WITH DIFFERENTIAL/PLATELET
Abs Immature Granulocytes: 0.03 10*3/uL (ref 0.00–0.07)
Basophils Absolute: 0 10*3/uL (ref 0.0–0.1)
Basophils Relative: 0 %
Eosinophils Absolute: 0.1 10*3/uL (ref 0.0–0.5)
Eosinophils Relative: 2 %
HCT: 43.3 % (ref 36.0–46.0)
Hemoglobin: 14.1 g/dL (ref 12.0–15.0)
Immature Granulocytes: 0 %
Lymphocytes Relative: 22 %
Lymphs Abs: 1.9 10*3/uL (ref 0.7–4.0)
MCH: 30.8 pg (ref 26.0–34.0)
MCHC: 32.6 g/dL (ref 30.0–36.0)
MCV: 94.5 fL (ref 80.0–100.0)
Monocytes Absolute: 0.9 10*3/uL (ref 0.1–1.0)
Monocytes Relative: 10 %
Neutro Abs: 5.7 10*3/uL (ref 1.7–7.7)
Neutrophils Relative %: 66 %
Platelets: 258 10*3/uL (ref 150–400)
RBC: 4.58 MIL/uL (ref 3.87–5.11)
RDW: 15.3 % (ref 11.5–15.5)
WBC: 8.7 10*3/uL (ref 4.0–10.5)
nRBC: 0 % (ref 0.0–0.2)

## 2022-01-27 LAB — COMPREHENSIVE METABOLIC PANEL
ALT: 19 U/L (ref 0–44)
AST: 21 U/L (ref 15–41)
Albumin: 3.8 g/dL (ref 3.5–5.0)
Alkaline Phosphatase: 61 U/L (ref 38–126)
Anion gap: 8 (ref 5–15)
BUN: 14 mg/dL (ref 8–23)
CO2: 27 mmol/L (ref 22–32)
Calcium: 9.5 mg/dL (ref 8.9–10.3)
Chloride: 103 mmol/L (ref 98–111)
Creatinine, Ser: 1.16 mg/dL — ABNORMAL HIGH (ref 0.44–1.00)
GFR, Estimated: 52 mL/min — ABNORMAL LOW (ref >60)
Glucose, Bld: 84 mg/dL (ref 70–99)
Potassium: 4.4 mmol/L (ref 3.5–5.1)
Sodium: 138 mmol/L (ref 135–145)
Total Bilirubin: 0.5 mg/dL (ref 0.3–1.2)
Total Protein: 7.2 g/dL (ref 6.5–8.1)

## 2022-01-27 LAB — CMP (CANCER CENTER ONLY)
ALT: 19 U/L (ref 0–44)
AST: 21 U/L (ref 15–41)
Albumin: 4 g/dL (ref 3.5–5.0)
Alkaline Phosphatase: 61 U/L (ref 38–126)
Anion gap: 8 (ref 5–15)
BUN: 15 mg/dL (ref 8–23)
CO2: 26 mmol/L (ref 22–32)
Calcium: 9.6 mg/dL (ref 8.9–10.3)
Chloride: 104 mmol/L (ref 98–111)
Creatinine: 1.2 mg/dL — ABNORMAL HIGH (ref 0.44–1.00)
GFR, Estimated: 50 mL/min — ABNORMAL LOW (ref >60)
Glucose, Bld: 96 mg/dL (ref 70–99)
Potassium: 4.9 mmol/L (ref 3.5–5.1)
Sodium: 138 mmol/L (ref 135–145)
Total Bilirubin: 0.5 mg/dL (ref 0.3–1.2)
Total Protein: 7.9 g/dL (ref 6.5–8.1)

## 2022-01-27 LAB — LACTATE DEHYDROGENASE
LDH: 124 U/L (ref 98–192)
LDH: 158 U/L (ref 98–192)

## 2022-01-27 MED ORDER — DRONABINOL 2.5 MG PO CAPS: 2.5000 mg | ORAL_CAPSULE | Freq: Two times a day (BID) | ORAL | 5 refills | Status: DC

## 2022-01-27 MED ORDER — HEPARIN SOD (PORK) LOCK FLUSH 100 UNIT/ML IV SOLN
500.0000 [IU] | Freq: Once | INTRAVENOUS | Status: AC | PRN
  Administered 2022-01-27: 13:00:00 500 [IU]

## 2022-01-27 MED ORDER — SODIUM CHLORIDE 0.9 % IV SOLN
480.0000 mg | Freq: Once | INTRAVENOUS | Status: AC
  Administered 2022-01-27: 480 mg via INTRAVENOUS
  Filled 2022-01-27: qty 48

## 2022-01-27 MED ORDER — SODIUM CHLORIDE 0.9 % IV SOLN: Freq: Once | INTRAVENOUS | Status: AC

## 2022-01-27 MED ORDER — SODIUM CHLORIDE 0.9% FLUSH
10.0000 mL | INTRAVENOUS | Status: DC | PRN
  Administered 2022-01-27: 13:00:00 10 mL

## 2022-01-27 NOTE — Patient Instructions (Signed)
[place] at [facility] ?Discharge Instructions ? ? ?You were seen and examined today by Dr. Delton Coombes. ? ?He reviewed the results of your lab work and CT scan.  All results are normal/stable.  ? ?We will proceed with your treatment today. ? ?Return as scheduled.  ? ? ?Thank you for choosing [place] [facility] to provide your oncology and hematology care.  To afford each patient quality time with our provider, please arrive at least 15 minutes before your scheduled appointment time.  ? ?If you have a lab appointment with the [facility] please come in thru the Main Entrance and check in at the main information desk. ? ?You need to re-schedule your appointment should you arrive 10 or more minutes late.  We strive to give you quality time with our providers, and arriving late affects you and other patients whose appointments are after yours.  Also, if you no show three or more times for appointments you may be dismissed from the clinic at the providers discretion.     ?Again, thank you for choosing [facility].  Our hope is that these requests will decrease the amount of time that you wait before being seen by our physicians.       ?_____________________________________________________________ ? ?Should you have questions after your visit to [facility], please contact our office at [phone] and follow the prompts.  Our office hours are 8:00 a.m. and 4:30 p.m. Monday - Friday.  Please note that voicemails left after 4:00 p.m. may not be returned until the following business day.  We are closed weekends and major holidays.  You do have access to a nurse 24-7, just call the main number to the clinic [phone] and do not press any options, hold on the line and a nurse will answer the phone.   ? ?For prescription refill requests, have your pharmacy contact our office and allow 72 hours.   ? ?Due to Covid, you will need to wear a  mask upon entering the hospital. If you do not have a mask, a mask will be given to you at the Main Entrance upon arrival. For doctor visits, patients may have 1 support person age 51 or older with them. For treatment visits, patients can not have anyone with them due to social distancing guidelines and our immunocompromised population.  ? ?   ?

## 2022-01-27 NOTE — Progress Notes (Signed)
Patient has been examined by Dr. Katragadda, and vital signs and labs have been reviewed. ANC, Creatinine, LFTs, hemoglobin, and platelets are within treatment parameters per M.D. - pt may proceed with treatment.    °

## 2022-01-27 NOTE — Progress Notes (Signed)
Patient presents today for treatment. Patient and labs accessed by Dr. Delton Coombes, patient okay for treatment.  ?Patient tolerated therapy with no complaints voiced. Side effects with management reviewed understanding verbalized. Port site clean and dry with no bruising or swelling noted at site. Good blood return noted before and after administration of therapy. Band aid applied. Patient left in satisfactory condition with VSS and no s/s of distress noted.  ?

## 2022-01-27 NOTE — Progress Notes (Signed)
[facility] [address] [place] [address]   CLINIC:  Medical Oncology/Hematology  PCP:  Melissa Squibb, MD [address] [name] [address] [phone]   REASON FOR VISIT:  Follow-up for metastatic right lung cancer  PRIOR THERAPY:  1. Bevacizumab, cisplatin and pemetrexed x 4 cycles from 09/02/2014 to 11/04/2014. 2. Bevacizumab and pemetrexed x 3 cycles from 12/24/2014 to 02/04/2015.  NGS Results: not done  CURRENT THERAPY: Nivolumab every 4 weeks  BRIEF ONCOLOGIC HISTORY:  Oncology History  Cancer of upper lobe of right lung ([facility])  07/28/2014 Imaging   CT chest: Large R apical mass consistent with malignancy. This is destroying the R 2nd rib with extension into adjacent soft tissue. R hilar adenopathy with R 5cm adrenal metastatic lesion.   08/01/2014 Initial Biopsy   Lung, needle/core biopsy(ies), right upper lobe - POORLY DIFFERENTIATED ADENOCARCINOMA, SEE COMMENT.   08/08/2014 PET scan   Large hypermetabolic R apical mass with evidence of direct chest wall and mediastinal invasion, right retrocrural lymphadenopathy, extensive retroperitoneal lymphadenopathy, and metastatic lesions to the adrenal glands    09/02/2014 - 11/04/2014 Chemotherapy   Cisplatin/Pemetrexed/Avastin every 21 days x 4 cycles   10/07/2014 - 10/27/2014 Radiation Therapy   Right lung apex for control of brachioplexopathy.   12/24/2014 - 02/25/2015 Chemotherapy   Alimta/Avastin every 21 days.   02/20/2015 Imaging   Increase in size of right adrenal metastasis and subjacent confluent retrocaval lymphadenopathy   02/25/2015 -  Chemotherapy   Nivolumab, zometa   05/04/2015 Imaging   CT CAP- Stable to slight decrease in the posterior right apical lesion. Stable appearance of posterior right upper rib an upper thoracic bony lesions. Slight improvement in right upper lobe tree-in-bud opacity. No new or progressive findings in...   07/28/2015 Imaging   CT CAP- Reduced size of the  right apical pleural parenchymal lesion and reduced size of the right adrenal metastatic lesion. Resolution of prior retrocrural adenopathy.  Right eccentric T1 and T2 sclerosis with sclerosis and tapering of the right second..   11/17/2015 Imaging   CT CAP- Stable soft tissue thickening in the apex of the right hemi thorax. Stable right adrenal metastasis. Nodularity along the trachea and mainstem bronchi, relatively new from 07/28/2015, favoring adherent debris.   11/18/2015 Treatment Plan Change   Zometa HELD for upcoming tooth extraction   11/24/2015 Treatment Plan Change   Zometa on hold at this time in preparation for tooth extraction in March 2017.  Zometa las given on 11/18/2015.   02/03/2016 Imaging   CT CAP- Heterogeneous right apical masslike consolidation and right adrenal metastasis are unchanged   04/06/2016 Treatment Plan Change   Zometa restarted 6 weeks out from tooth extraction (04/06/2016)   05/12/2016 Imaging   CT CAP- NED in the chest, abdomen or pelvis.  Some areas of nodularity associated with the mainstem bronchi in the left upper lobe bronchus, favored to represent adherent inspissated secretions   08/17/2016 Imaging   CT CAP- 1. Stable CTs of the chest and abdomen. No evidence of progressive metastatic disease. 2. Probable treated tumor at the right apex, right adrenal gland and T2 vertebral body, stable. 3. Fluctuating nodularity along the walls of the trachea and mainstem bronchi, likely secretions.   12/12/2016 Imaging   Further decrease in size of treated tumor within the right apex. 2. Stable treated tumor involving the right second rib and T2 vertebra. 3. Stable right adrenal gland treated tumor. 4. Emphysema 5. Aortic atherosclerosis  01/04/2017 Imaging   MRI brain- Normal brain MRI.  No intracranial metastatic disease.   03/15/2017 Imaging   CT CAP- 1. No new or progressive metastatic disease in the chest or abdomen. 2. Stable treated tumor in the  apical right upper lobe. Stable treated right posterior second rib and right T2 vertebral lesions. Stable treated right adrenal metastasis. 3. Aortic atherosclerosis. 4. Moderate emphysema with mild diffuse bronchial wall thickening, suggesting COPD.   06/12/2017 Imaging   CT CAP 1. Similar appearance of treated primary within the right apex. 2. Similar areas of sclerosis within the right second rib, T2, and less so T1 vertebral bodies. These are most consistent with treated metastasis. 3. Similar right adrenal treated metastasis. 4. No evidence of new or progressive disease. 5. Similar right and progressive left areas of bronchial wall thickening and probable mucoid impaction. Correlate with interval infectious symptoms. 6.  Emphysema (ICD10-J43.9). 7. Coronary artery atherosclerosis. Aortic Atherosclerosis (ICD10-I70.0).     10/16/2017 Imaging   CT CAP 1. Stable appearance of the prior Pancoast tumor and related bony findings in the right second rib and right T1 and T2 vertebra compatible with successfully treated tumor. No significant enlargement or new lesions are identified. Similarly the treated right adrenal metastatic lesion is stable in appearance. 2. Upper normal size right hilar lymph node may warrant surveillance. Currently 9 mm in short axis. 3. Other imaging findings of potential clinical significance: Aortic Atherosclerosis (ICD10-I70.0) and Emphysema (ICD10-J43.9). Scattered proximal sigmoid colon diverticula. Mucus plugging medially in the left upper lobe and posteriorly in the right upper lobe.       CANCER STAGING:  Cancer Staging  No matching staging information was found for the patient.  INTERVAL HISTORY:  [name], a 66 y.o. female, returns for routine follow-up and consideration for next cycle of chemotherapy. Melissa Sullivan was last seen on 11/04/2021.  Due for cycle #55 of Nivolumab today.   Overall, she tells me she has been feeling  pretty well. She denies diarrhea. She reports cough which she attributes to seasonal allergies for which she takes Zyrtec. Her appetite is stable.   Overall, she feels ready for next cycle of chemo today.   REVIEW OF SYSTEMS:  Review of Systems  Constitutional:  Negative for appetite change (stable) and fatigue.  Respiratory:  Positive for cough.   Psychiatric/Behavioral:  Positive for sleep disturbance.   All other systems reviewed and are negative.  PAST MEDICAL/SURGICAL HISTORY:  Past Medical History:  Diagnosis Date   Adrenal mass, right ([facility]) 07/28/2014   Anemia    Bone metastases ([facility]) 04/05/2016   Cancer ([facility]) 2015   lung  right   Diabetes mellitus without complication ([facility])    GERD (gastroesophageal reflux disease)    Hyperlipidemia    Hypertension    Hypothyroidism due to medication 01/30/2017   Lung mass 07/28/2014   Reflux    Past Surgical History:  Procedure Laterality Date   AMPUTATION Right 02/22/2017   Procedure: PARTIAL AMPUTATION RIGHT GREAT TOE;  Surgeon: Caprice Beaver, DPM;  Location: [facility];  Service: Podiatry;  Laterality: Right;   APPENDECTOMY     BIOPSY  09/22/2020   Procedure: BIOPSY;  Surgeon: Eloise Harman, DO;  Location: [facility];  Service: Endoscopy;;   COLONOSCOPY WITH PROPOFOL N/A 09/22/2020   non-bleeding internal hemorrhoids, sigmoid and descending colon diverticulosis, localized mild inflammation in sigmoid that was felt due to prep artifact.    ESOPHAGOGASTRODUODENOSCOPY N/A 12/09/2014   OHY:WVPXTG esophageal stricture/mild-to-noderate erosive gastritis.  negative H.pylori   FLEXIBLE SIGMOIDOSCOPY  2011   Dr. [name]: hyperplastic polyp   LUNG BIOPSY Right 07/2014   CT guided   MALONEY DILATION N/A 12/09/2014   Procedure: MALONEY DILATION;  Surgeon: [name], MD;  Location: [facility];  Service: Endoscopy;  Laterality: N/A;   PORTACATH PLACEMENT Left 09/01/2014   SAVORY DILATION N/A 12/09/2014   Procedure: SAVORY DILATION;   Surgeon: [name], MD;  Location: [facility];  Service: Endoscopy;  Laterality: N/A;    SOCIAL HISTORY:  Social History   Socioeconomic History   Marital status: Married    Spouse name: Not on file   Number of children: Not on file   Years of education: Not on file   Highest education level: Not on file  Occupational History   Not on file  Tobacco Use   Smoking status: Light Smoker    Packs/day: 0.30    Years: 20.00    Pack years: 6.00    Types: Cigarettes   Smokeless tobacco: Never  Vaping Use   Vaping Use: Never used  Substance and Sexual Activity   Alcohol use: No   Drug use: No   Sexual activity: Not on file  Other Topics Concern   Not on file  Social History Narrative   Not on file   Social Determinants of Health   Financial Resource Strain: Not on file  Food Insecurity: Not on file  Transportation Needs: Not on file  Physical Activity: Not on file  Stress: Not on file  Social Connections: Not on file  Intimate Partner Violence: Not on file    FAMILY HISTORY:  Family History  Problem Relation Age of Onset   Cancer Sister    Colon cancer Neg Hx     CURRENT MEDICATIONS:  Current Outpatient Medications  Medication Sig Dispense Refill   dexlansoprazole (DEXILANT) 60 MG capsule Dexilant 60 mg capsule, delayed release  TAKE 1 CAPSULE BY MOUTH IN THE MORNING WITH BREAKFAST     dronabinol (MARINOL) 2.5 MG capsule Take 1 capsule (2.5 mg total) by mouth 2 (two) times daily before lunch and supper. 60 capsule 5   ENSURE (ENSURE) Take 1 Can by mouth 4 (four) times daily.     Ferrous Sulfate (IRON) 325 (65 Fe) MG TABS Take 325 mg by mouth daily.      fluticasone (FLONASE) 50 MCG/ACT nasal spray fluticasone propionate 50 mcg/actuation nasal spray,suspension     fluticasone (FLONASE) 50 MCG/ACT nasal spray Place into both nostrils.     Fluticasone-Salmeterol (ADVAIR DISKUS) 500-50 MCG/DOSE AEPB Inhale 1 puff into the lungs 2 (two) times daily. 60 each 6    levocetirizine (XYZAL) 5 MG tablet Take 5 mg by mouth every evening.      levothyroxine (SYNTHROID) 25 MCG tablet TAKE 1/2 (ONE-HALF) TABLET BY MOUTH ONCE DAILY BEFORE BREAKFAST 45 tablet 3   lidocaine-prilocaine (EMLA) cream Apply 1 application topically as needed (port access). 30 g 3   naloxone (NARCAN) nasal spray 4 mg/0.1 mL      Nivolumab (OPDIVO IV) Inject into the vein every 28 (twenty-eight) days.     omeprazole (PRILOSEC) 40 MG capsule omeprazole 40 mg capsule,delayed release  Take one capsule by mouth once daily about 30 minutes before breakfast     Oxycodone HCl 10 MG TABS Take 1 tablet (10 mg total) by mouth every 12 (twelve) hours as needed (pain.). 60 tablet 0   pantoprazole (PROTONIX) 40 MG tablet Take 1 tablet (40 mg  total) by mouth daily. 30 minutes before breakfast 90 tablet 3   promethazine (PHENERGAN) 6.25 MG/5ML syrup      pseudoephedrine-guaifenesin (MUCINEX D) 60-600 MG 12 hr tablet Take 1 tablet by mouth daily as needed for congestion.     rosuvastatin (CRESTOR) 20 MG tablet Take 20 mg by mouth daily.     simvastatin (ZOCOR) 40 MG tablet simvastatin 40 mg tablet  TAKE 1 TABLET BY MOUTH ONCE DAILY     albuterol (VENTOLIN HFA) 108 (90 Base) MCG/ACT inhaler Inhale 2 puffs into the lungs every 6 (six) hours as needed for wheezing or shortness of breath. (Patient not taking: Reported on 01/27/2022)     No current facility-administered medications for this visit.   Facility-Administered Medications Ordered in Other Visits  Medication Dose Route Frequency Provider Last Rate Last Admin   sodium chloride flush (NS) 0.9 % injection 10 mL  10 mL Intracatheter PRN Derek Jack, MD   10 mL at 01/03/19 0930    ALLERGIES:  Allergies  Allergen Reactions   Xgeva [Denosumab]     osteonecrosis    PHYSICAL EXAM:  Performance status (ECOG): 0 - Asymptomatic  Vitals:   01/27/22 1012  BP: (!) 142/89  Pulse: 97  Resp: 18  Temp: (!) 96.8 F (36 C)  SpO2: 100%   Wt  Readings from Last 3 Encounters:  01/27/22 104 lb 3.2 oz (47.3 kg)  12/30/21 102 lb 6.4 oz (46.4 kg)  12/02/21 106 lb 3.2 oz (48.2 kg)   Physical Exam Vitals reviewed.  Constitutional:      Appearance: Normal appearance.  Cardiovascular:     Rate and Rhythm: Normal rate and regular rhythm.     Pulses: Normal pulses.     Heart sounds: Normal heart sounds.  Pulmonary:     Effort: Pulmonary effort is normal.     Breath sounds: Normal breath sounds.  Abdominal:     Palpations: Abdomen is soft. There is no hepatomegaly, splenomegaly or mass.     Tenderness: There is no abdominal tenderness.  Musculoskeletal:     Right lower leg: No edema.     Left lower leg: No edema.  Lymphadenopathy:     Lower Body: No right inguinal adenopathy. No left inguinal adenopathy.  Neurological:     General: No focal deficit present.     Mental Status: She is alert and oriented to person, place, and time.  Psychiatric:        Mood and Affect: Mood normal.        Behavior: Behavior normal.    LABORATORY DATA:  I have reviewed the labs as listed.  CBC Latest Ref Rng & Units 01/27/2022 12/30/2021 12/02/2021  WBC 4.0 - 10.5 K/uL 8.7 8.1 -  Hemoglobin 12.0 - 15.0 g/dL 14.1 14.2 13.6  Hematocrit 36.0 - 46.0 % 43.3 43.0 41.6  Platelets 150 - 400 K/uL 258 260 -   CMP Latest Ref Rng & Units 01/27/2022 12/30/2021 12/02/2021  Glucose 70 - 99 mg/dL 96 100(H) 89  BUN 8 - 23 mg/dL 15 15 13   Creatinine 0.44 - 1.00 mg/dL 1.20(H) 1.24(H) 1.22(H)  Sodium 135 - 145 mmol/L 138 136 137  Potassium 3.5 - 5.1 mmol/L 4.9 3.9 4.2  Chloride 98 - 111 mmol/L 104 102 104  CO2 22 - 32 mmol/L 26 28 27   Calcium 8.9 - 10.3 mg/dL 9.6 9.8 9.8  Total Protein 6.5 - 8.1 g/dL 7.9 7.8 7.9  Total Bilirubin 0.3 - 1.2 mg/dL 0.5 0.6 0.3  Alkaline Phos 38 - 126 U/L 61 59 67  AST 15 - 41 U/L 21 25 25   ALT 0 - 44 U/L 19 23 21     DIAGNOSTIC IMAGING:  I have independently reviewed the scans and discussed with the patient. CT CHEST ABDOMEN  PELVIS W CONTRAST  Result Date: 01/22/2022 CLINICAL DATA:  66 year old female with history of right upper lobe lung cancer. Evaluate for metastatic disease. * onc * EXAM: CT CHEST, ABDOMEN, AND PELVIS WITH CONTRAST TECHNIQUE: Multidetector CT imaging of the chest, abdomen and pelvis was performed following the standard protocol during bolus administration of intravenous contrast. RADIATION DOSE REDUCTION: This exam was performed according to the departmental dose-optimization program which includes automated exposure control, adjustment of the mA and/or kV according to patient size and/or use of iterative reconstruction technique. CONTRAST:  62mL OMNIPAQUE IOHEXOL 300 MG/ML  SOLN COMPARISON:  Multiple priors, most recently 08/11/2021. FINDINGS: CT CHEST FINDINGS Cardiovascular: Heart size is normal. There is no significant pericardial fluid, thickening or pericardial calcification. Aortic atherosclerosis. No definite coronary artery calcifications. Left-sided internal jugular single-lumen porta cath with tip terminating in the distal superior vena cava. Mediastinum/Nodes: No pathologically enlarged mediastinal or hilar lymph nodes. Esophagus is unremarkable in appearance. No axillary lymphadenopathy. Lungs/Pleura: Chronic pleuroparenchymal thickening and architectural distortion in the apex of the right hemithorax at the site of the previously treated right apical mass, with chronic dystrophic calcifications throughout this region, very similar to prior examinations. A few scattered areas of mild cylindrical bronchiectasis are noted, including 1 in the medial left upper lobe where there is chronic mucoid impaction. No other definite suspicious appearing pulmonary nodules or masses are noted. No acute consolidative airspace disease. No pleural effusions. Diffuse bronchial wall thickening with mild to moderate centrilobular and paraseptal emphysema. Musculoskeletal: There are no aggressive appearing lytic or blastic  lesions noted in the visualized portions of the skeleton. CT ABDOMEN PELVIS FINDINGS Hepatobiliary: No suspicious cystic or solid hepatic lesions. No intra or extrahepatic biliary ductal dilatation. Gallbladder is normal in appearance. Pancreas: No pancreatic mass. No pancreatic ductal dilatation. No pancreatic or peripancreatic fluid collections or inflammatory changes. Spleen: Unremarkable. Adrenals/Urinary Tract: Stable appearance of chronic calcified right adrenal mass, presumably sequela of remote right adrenal hemorrhage or infection. Left adrenal gland and bilateral kidneys are normal in appearance. No hydroureteronephrosis. Urinary bladder is normal in appearance. Stomach/Bowel: The appearance of the stomach is normal. There is no pathologic dilatation of small bowel or colon. A few scattered colonic diverticulae are noted, without surrounding inflammatory changes to suggest an acute diverticulitis at this time. The appendix is not confidently identified and may be surgically absent. Regardless, there are no inflammatory changes noted adjacent to the cecum to suggest the presence of an acute appendicitis at this time. Vascular/Lymphatic: Aortic atherosclerosis, without evidence of aneurysm or dissection in the abdominal or pelvic vasculature. No lymphadenopathy noted in the abdomen or pelvis. Reproductive: Uterus and ovaries are unremarkable in appearance. Other: No significant volume of ascites.  No pneumoperitoneum. Musculoskeletal: There are no aggressive appearing lytic or blastic lesions noted in the visualized portions of the skeleton. IMPRESSION: 1. Stable post treatment related changes of radiation therapy in the upper right hemithorax, without evidence to suggest recurrent or metastatic disease in the chest, abdomen or pelvis. 2. Diffuse bronchial wall thickening with moderate centrilobular and paraseptal emphysema; imaging findings suggestive of underlying COPD. 3. Aortic atherosclerosis. 4.  Colonic diverticulosis without evidence of acute diverticulitis at this time. 5. Additional incidental findings, as above. Electronically Signed  By: Vinnie Langton M.D.   On: 01/22/2022 10:35     ASSESSMENT:  1. Cancer of upper lobe of right lung [facility]) Metastatic adenocarcinoma of the lung to the bones and right adrenal: -Foundation 1 testing shows 14 genomic alterations, no approved therapies. -Opdivo 480 mg monthly started on February 25, 2015. -CT CAP on February 21, 2020 shows no evidence of progression or metastatic disease.  T2 sclerotic lesion is stable. -CTAP on 08/10/2020 with no evidence of recurrent metastatic disease in the abdomen or pelvis.  Irregular mildly enlarged right adrenal gland unchanged. -CT chest on 09/09/2020 showed partial clearing of the airspace opacities noted medially in the lingula and left lower lobe.  New irregular nodule posteriorly at the right apex likely inflammatory.  Stable sclerotic T2 vertebral body lesion. -Reviewed CT CAP from 02/19/2021 which showed stable to slight decrease in size of mediastinal lymph nodes.  Stable posttreatment changes in the left lung apex and slight decrease in size of the absent irregular nodule.  Stable sclerotic lesion in T2 vertebral body.  No new progressive findings.     PLAN:  1.  Metastatic lung adenocarcinoma: - She is tolerating Opdivo monthly very well. - Reviewed CT CAP from 01/22/2022 which showed stable posttreatment changes with no new recurrence or metastatic disease. - Reviewed labs today which shows normal CBC and LFTs and stable creatinine. - Continue nivolumab every 4 weeks.  RTC 3 months for follow-up.   2.  High risk drug monitoring: - Continue Synthroid 25 mcg daily.  TSH was 1.8.   3.  Health maintenance: - Mammogram on 06/28/2021, BI-RADS Category 1.   4.  CKD: - Baseline creatinine between 1.2-1.3.  Today creatinine 1.20.   5.  Bone metastasis: - Bisphosphonates on hold due to ONJ in 2019.  6.  Chronic  pains: - Continue oxycodone as needed.  7.  Weight loss: - Continue Marinol 2.5 mg twice daily.  Weight is stable.   Orders placed this encounter:  Orders Placed This Encounter  Procedures   CBC with Differential/Platelet     Derek Jack, MD [facility] [phone]   I, Thana Ates, am acting as a scribe for Dr. Derek Jack.  I, Derek Jack MD, have reviewed the above documentation for accuracy and completeness, and I agree with the above.

## 2022-01-27 NOTE — Patient Instructions (Signed)
[facility]  Discharge Instructions: ?Thank you for choosing [facility] to provide your oncology and hematology care.  ?If you have a lab appointment with the [facility], please come in thru the Main Entrance and check in at the main information desk. ? ?Wear comfortable clothing and clothing appropriate for easy access to any Portacath or PICC line.  ? ?We strive to give you quality time with your provider. You may need to reschedule your appointment if you arrive late (15 or more minutes).  Arriving late affects you and other patients whose appointments are after yours.  Also, if you miss three or more appointments without notifying the office, you may be dismissed from the clinic at the provider?s discretion.    ?  ?For prescription refill requests, have your pharmacy contact our office and allow 72 hours for refills to be completed.   ? ?Today you received the following chemotherapy and/or immunotherapy agents Opdivio, return as scheduled. ?  ?To help prevent nausea and vomiting after your treatment, we encourage you to take your nausea medication as directed. ? ?BELOW ARE SYMPTOMS THAT SHOULD BE REPORTED IMMEDIATELY: ?*FEVER GREATER THAN 100.4 F (38 ?C) OR HIGHER ?*CHILLS OR SWEATING ?*NAUSEA AND VOMITING THAT IS NOT CONTROLLED WITH YOUR NAUSEA MEDICATION ?*UNUSUAL SHORTNESS OF BREATH ?*UNUSUAL BRUISING OR BLEEDING ?*URINARY PROBLEMS (pain or burning when urinating, or frequent urination) ?*BOWEL PROBLEMS (unusual diarrhea, constipation, pain near the anus) ?TENDERNESS IN MOUTH AND THROAT WITH OR WITHOUT PRESENCE OF ULCERS (sore throat, sores in mouth, or a toothache) ?UNUSUAL RASH, SWELLING OR PAIN  ?UNUSUAL VAGINAL DISCHARGE OR ITCHING  ? ?Items with * indicate a potential emergency and should be followed up as soon as possible or go to the Emergency Department if any problems should occur. ? ?Please show the CHEMOTHERAPY ALERT CARD or IMMUNOTHERAPY ALERT CARD at check-in to the  Emergency Department and triage nurse. ? ?Should you have questions after your visit or need to cancel or reschedule your appointment, please contact [facility] [phone]  and follow the prompts.  Office hours are 8:00 a.m. to 4:30 p.m. Monday - Friday. Please note that voicemails left after 4:00 p.m. may not be returned until the following business day.  We are closed weekends and major holidays. You have access to a nurse at all times for urgent questions. Please call the main number to the clinic [phone] and follow the prompts. ? ?For any non-urgent questions, you may also contact your provider using MyChart. We now offer e-Visits for anyone 31 and older to request care online for non-urgent symptoms. For details visit [email or web address]. ?  ?Also download the MyChart app! Go to the app store, search "MyChart", open the app, select [facility], and log in with your MyChart username and password. ? ?Due to Covid, a mask is required upon entering the hospital/clinic. If you do not have a mask, one will be given to you upon arrival. For doctor visits, patients may have 1 support person aged 16 or older with them. For treatment visits, patients cannot have anyone with them due to current Covid guidelines and our immunocompromised population.  ?

## 2022-02-24 ENCOUNTER — Other Ambulatory Visit (HOSPITAL_COMMUNITY): Payer: Self-pay | Admitting: *Deleted

## 2022-02-24 ENCOUNTER — Inpatient Hospital Stay (HOSPITAL_COMMUNITY): Payer: Medicare HMO

## 2022-02-24 ENCOUNTER — Inpatient Hospital Stay (HOSPITAL_COMMUNITY): Payer: Medicare HMO | Attending: Hematology

## 2022-02-24 VITALS — BP 104/65 | HR 85 | Temp 98.4°F | Resp 18 | Ht 59.0 in | Wt 100.5 lb

## 2022-02-24 DIAGNOSIS — C7951 Secondary malignant neoplasm of bone: Secondary | ICD-10-CM | POA: Diagnosis not present

## 2022-02-24 DIAGNOSIS — Z79891 Long term (current) use of opiate analgesic: Secondary | ICD-10-CM | POA: Insufficient documentation

## 2022-02-24 DIAGNOSIS — N189 Chronic kidney disease, unspecified: Secondary | ICD-10-CM | POA: Diagnosis not present

## 2022-02-24 DIAGNOSIS — Z79890 Hormone replacement therapy (postmenopausal): Secondary | ICD-10-CM | POA: Insufficient documentation

## 2022-02-24 DIAGNOSIS — Z5112 Encounter for antineoplastic immunotherapy: Secondary | ICD-10-CM | POA: Diagnosis not present

## 2022-02-24 DIAGNOSIS — G8929 Other chronic pain: Secondary | ICD-10-CM | POA: Insufficient documentation

## 2022-02-24 DIAGNOSIS — F17210 Nicotine dependence, cigarettes, uncomplicated: Secondary | ICD-10-CM | POA: Insufficient documentation

## 2022-02-24 DIAGNOSIS — C3411 Malignant neoplasm of upper lobe, right bronchus or lung: Secondary | ICD-10-CM | POA: Insufficient documentation

## 2022-02-24 DIAGNOSIS — C7971 Secondary malignant neoplasm of right adrenal gland: Secondary | ICD-10-CM | POA: Insufficient documentation

## 2022-02-24 DIAGNOSIS — K8020 Calculus of gallbladder without cholecystitis without obstruction: Secondary | ICD-10-CM | POA: Insufficient documentation

## 2022-02-24 DIAGNOSIS — Z79899 Other long term (current) drug therapy: Secondary | ICD-10-CM | POA: Diagnosis not present

## 2022-02-24 DIAGNOSIS — E278 Other specified disorders of adrenal gland: Secondary | ICD-10-CM

## 2022-02-24 DIAGNOSIS — J439 Emphysema, unspecified: Secondary | ICD-10-CM | POA: Insufficient documentation

## 2022-02-24 DIAGNOSIS — I129 Hypertensive chronic kidney disease with stage 1 through stage 4 chronic kidney disease, or unspecified chronic kidney disease: Secondary | ICD-10-CM | POA: Insufficient documentation

## 2022-02-24 LAB — CBC WITH DIFFERENTIAL/PLATELET
Abs Immature Granulocytes: 0.02 10*3/uL (ref 0.00–0.07)
Basophils Absolute: 0 10*3/uL (ref 0.0–0.1)
Basophils Relative: 0 %
Eosinophils Absolute: 0.1 10*3/uL (ref 0.0–0.5)
Eosinophils Relative: 2 %
HCT: 39.8 % (ref 36.0–46.0)
Hemoglobin: 13 g/dL (ref 12.0–15.0)
Immature Granulocytes: 0 %
Lymphocytes Relative: 26 %
Lymphs Abs: 2.1 10*3/uL (ref 0.7–4.0)
MCH: 30.4 pg (ref 26.0–34.0)
MCHC: 32.7 g/dL (ref 30.0–36.0)
MCV: 93.2 fL (ref 80.0–100.0)
Monocytes Absolute: 0.7 10*3/uL (ref 0.1–1.0)
Monocytes Relative: 8 %
Neutro Abs: 5 10*3/uL (ref 1.7–7.7)
Neutrophils Relative %: 64 %
Platelets: 257 10*3/uL (ref 150–400)
RBC: 4.27 MIL/uL (ref 3.87–5.11)
RDW: 15.8 % — ABNORMAL HIGH (ref 11.5–15.5)
WBC: 7.9 10*3/uL (ref 4.0–10.5)
nRBC: 0 % (ref 0.0–0.2)

## 2022-02-24 LAB — COMPREHENSIVE METABOLIC PANEL
ALT: 18 U/L (ref 0–44)
AST: 24 U/L (ref 15–41)
Albumin: 3.9 g/dL (ref 3.5–5.0)
Alkaline Phosphatase: 59 U/L (ref 38–126)
Anion gap: 8 (ref 5–15)
BUN: 20 mg/dL (ref 8–23)
CO2: 25 mmol/L (ref 22–32)
Calcium: 9.6 mg/dL (ref 8.9–10.3)
Chloride: 107 mmol/L (ref 98–111)
Creatinine, Ser: 1.3 mg/dL — ABNORMAL HIGH (ref 0.44–1.00)
GFR, Estimated: 46 mL/min — ABNORMAL LOW (ref >60)
Glucose, Bld: 99 mg/dL (ref 70–99)
Potassium: 4.2 mmol/L (ref 3.5–5.1)
Sodium: 140 mmol/L (ref 135–145)
Total Bilirubin: 0.7 mg/dL (ref 0.3–1.2)
Total Protein: 7.3 g/dL (ref 6.5–8.1)

## 2022-02-24 LAB — TSH: TSH: 1.565 u[IU]/mL (ref 0.350–4.500)

## 2022-02-24 MED ORDER — SODIUM CHLORIDE 0.9% FLUSH
10.0000 mL | INTRAVENOUS | Status: DC | PRN
  Administered 2022-02-24: 10 mL

## 2022-02-24 MED ORDER — OXYCODONE HCL 10 MG PO TABS: 10.0000 mg | ORAL_TABLET | Freq: Two times a day (BID) | ORAL | 0 refills | Status: DC | PRN

## 2022-02-24 MED ORDER — SODIUM CHLORIDE 0.9 % IV SOLN: Freq: Once | INTRAVENOUS | Status: AC

## 2022-02-24 MED ORDER — SODIUM CHLORIDE 0.9 % IV SOLN
480.0000 mg | Freq: Once | INTRAVENOUS | Status: AC
  Administered 2022-02-24: 480 mg via INTRAVENOUS
  Filled 2022-02-24: qty 24

## 2022-02-24 MED ORDER — HEPARIN SOD (PORK) LOCK FLUSH 100 UNIT/ML IV SOLN
500.0000 [IU] | Freq: Once | INTRAVENOUS | Status: AC | PRN
  Administered 2022-02-24: 500 [IU]

## 2022-02-24 NOTE — Patient Instructions (Signed)
[facility]  Discharge Instructions: ?Thank you for choosing [facility] to provide your oncology and hematology care.  ?If you have a lab appointment with the [facility], please come in thru the Main Entrance and check in at the main information desk. ? ?Wear comfortable clothing and clothing appropriate for easy access to any Portacath or PICC line.  ? ?We strive to give you quality time with your provider. You may need to reschedule your appointment if you arrive late (15 or more minutes).  Arriving late affects you and other patients whose appointments are after yours.  Also, if you miss three or more appointments without notifying the office, you may be dismissed from the clinic at the provider?s discretion.    ?  ?For prescription refill requests, have your pharmacy contact our office and allow 72 hours for refills to be completed.   ? ?Today you received the following chemotherapy Opdivo ?  ? ? ?BELOW ARE SYMPTOMS THAT SHOULD BE REPORTED IMMEDIATELY: ?*FEVER GREATER THAN 100.4 F (38 ?C) OR HIGHER ?*CHILLS OR SWEATING ?*NAUSEA AND VOMITING THAT IS NOT CONTROLLED WITH YOUR NAUSEA MEDICATION ?*UNUSUAL SHORTNESS OF BREATH ?*UNUSUAL BRUISING OR BLEEDING ?*URINARY PROBLEMS (pain or burning when urinating, or frequent urination) ?*BOWEL PROBLEMS (unusual diarrhea, constipation, pain near the anus) ?TENDERNESS IN MOUTH AND THROAT WITH OR WITHOUT PRESENCE OF ULCERS (sore throat, sores in mouth, or a toothache) ?UNUSUAL RASH, SWELLING OR PAIN  ?UNUSUAL VAGINAL DISCHARGE OR ITCHING  ? ?Items with * indicate a potential emergency and should be followed up as soon as possible or go to the Emergency Department if any problems should occur. ? ?Please show the CHEMOTHERAPY ALERT CARD or IMMUNOTHERAPY ALERT CARD at check-in to the Emergency Department and triage nurse. ? ?Should you have questions after your visit or need to cancel or reschedule your appointment, please contact [facility] [phone]  and follow the prompts.  Office hours are 8:00 a.m. to 4:30 p.m. Monday - Friday. Please note that voicemails left after 4:00 p.m. may not be returned until the following business day.  We are closed weekends and major holidays. You have access to a nurse at all times for urgent questions. Please call the main number to the clinic [phone] and follow the prompts. ? ?For any non-urgent questions, you may also contact your provider using MyChart. We now offer e-Visits for anyone 26 and older to request care online for non-urgent symptoms. For details visit [email or web address]. ?  ?Also download the MyChart app! Go to the app store, search "MyChart", open the app, select [facility], and log in with your MyChart username and password. ? ?Due to Covid, a mask is required upon entering the hospital/clinic. If you do not have a mask, one will be given to you upon arrival. For doctor visits, patients may have 1 support person aged 14 or older with them. For treatment visits, patients cannot have anyone with them due to current Covid guidelines and our immunocompromised population.  ?

## 2022-02-24 NOTE — Progress Notes (Signed)
Pt presents today for Opdivo per provider's order. Labs and vital signs WNL for treatment today. Okay to proceed with treatment today. ? ?Opdivo given today per MD orders. Tolerated infusion without adverse affects. Vital signs stable. No complaints at this time. Discharged from clinic ambulatory in stable condition. Alert and oriented x 3. F/U with [facility] as scheduled.   ?

## 2022-03-03 ENCOUNTER — Encounter: Payer: Self-pay | Admitting: Internal Medicine

## 2022-03-10 DIAGNOSIS — G629 Polyneuropathy, unspecified: Secondary | ICD-10-CM | POA: Diagnosis not present

## 2022-03-10 DIAGNOSIS — B351 Tinea unguium: Secondary | ICD-10-CM | POA: Diagnosis not present

## 2022-03-24 ENCOUNTER — Inpatient Hospital Stay (HOSPITAL_COMMUNITY): Payer: Medicare HMO

## 2022-03-25 ENCOUNTER — Inpatient Hospital Stay (HOSPITAL_COMMUNITY): Payer: Medicare HMO | Attending: Hematology

## 2022-03-25 ENCOUNTER — Inpatient Hospital Stay (HOSPITAL_COMMUNITY): Payer: Medicare HMO

## 2022-03-25 VITALS — BP 116/72 | HR 73 | Temp 96.8°F | Resp 18 | Wt 99.4 lb

## 2022-03-25 DIAGNOSIS — Z5112 Encounter for antineoplastic immunotherapy: Secondary | ICD-10-CM | POA: Insufficient documentation

## 2022-03-25 DIAGNOSIS — C7951 Secondary malignant neoplasm of bone: Secondary | ICD-10-CM | POA: Diagnosis not present

## 2022-03-25 DIAGNOSIS — I129 Hypertensive chronic kidney disease with stage 1 through stage 4 chronic kidney disease, or unspecified chronic kidney disease: Secondary | ICD-10-CM | POA: Diagnosis not present

## 2022-03-25 DIAGNOSIS — G8929 Other chronic pain: Secondary | ICD-10-CM | POA: Insufficient documentation

## 2022-03-25 DIAGNOSIS — N189 Chronic kidney disease, unspecified: Secondary | ICD-10-CM | POA: Insufficient documentation

## 2022-03-25 DIAGNOSIS — F17210 Nicotine dependence, cigarettes, uncomplicated: Secondary | ICD-10-CM | POA: Diagnosis not present

## 2022-03-25 DIAGNOSIS — Z79899 Other long term (current) drug therapy: Secondary | ICD-10-CM | POA: Insufficient documentation

## 2022-03-25 DIAGNOSIS — J439 Emphysema, unspecified: Secondary | ICD-10-CM | POA: Insufficient documentation

## 2022-03-25 DIAGNOSIS — C7971 Secondary malignant neoplasm of right adrenal gland: Secondary | ICD-10-CM | POA: Diagnosis not present

## 2022-03-25 DIAGNOSIS — Z79891 Long term (current) use of opiate analgesic: Secondary | ICD-10-CM | POA: Diagnosis not present

## 2022-03-25 DIAGNOSIS — C3411 Malignant neoplasm of upper lobe, right bronchus or lung: Secondary | ICD-10-CM | POA: Diagnosis not present

## 2022-03-25 DIAGNOSIS — Z79890 Hormone replacement therapy (postmenopausal): Secondary | ICD-10-CM | POA: Diagnosis not present

## 2022-03-25 DIAGNOSIS — K8020 Calculus of gallbladder without cholecystitis without obstruction: Secondary | ICD-10-CM | POA: Insufficient documentation

## 2022-03-25 DIAGNOSIS — E278 Other specified disorders of adrenal gland: Secondary | ICD-10-CM

## 2022-03-25 LAB — CBC WITH DIFFERENTIAL/PLATELET
Abs Immature Granulocytes: 0.03 10*3/uL (ref 0.00–0.07)
Basophils Absolute: 0 10*3/uL (ref 0.0–0.1)
Basophils Relative: 0 %
Eosinophils Absolute: 0.2 10*3/uL (ref 0.0–0.5)
Eosinophils Relative: 1 %
HCT: 43.6 % (ref 36.0–46.0)
Hemoglobin: 14 g/dL (ref 12.0–15.0)
Immature Granulocytes: 0 %
Lymphocytes Relative: 29 %
Lymphs Abs: 3 10*3/uL (ref 0.7–4.0)
MCH: 29.8 pg (ref 26.0–34.0)
MCHC: 32.1 g/dL (ref 30.0–36.0)
MCV: 92.8 fL (ref 80.0–100.0)
Monocytes Absolute: 0.7 10*3/uL (ref 0.1–1.0)
Monocytes Relative: 7 %
Neutro Abs: 6.6 10*3/uL (ref 1.7–7.7)
Neutrophils Relative %: 63 %
Platelets: 290 10*3/uL (ref 150–400)
RBC: 4.7 MIL/uL (ref 3.87–5.11)
RDW: 15.9 % — ABNORMAL HIGH (ref 11.5–15.5)
WBC: 10.5 10*3/uL (ref 4.0–10.5)
nRBC: 0 % (ref 0.0–0.2)

## 2022-03-25 LAB — COMPREHENSIVE METABOLIC PANEL
ALT: 15 U/L (ref 0–44)
AST: 20 U/L (ref 15–41)
Albumin: 4.2 g/dL (ref 3.5–5.0)
Alkaline Phosphatase: 67 U/L (ref 38–126)
Anion gap: 8 (ref 5–15)
BUN: 19 mg/dL (ref 8–23)
CO2: 25 mmol/L (ref 22–32)
Calcium: 9.7 mg/dL (ref 8.9–10.3)
Chloride: 105 mmol/L (ref 98–111)
Creatinine, Ser: 1.21 mg/dL — ABNORMAL HIGH (ref 0.44–1.00)
GFR, Estimated: 50 mL/min — ABNORMAL LOW (ref >60)
Glucose, Bld: 82 mg/dL (ref 70–99)
Potassium: 3.6 mmol/L (ref 3.5–5.1)
Sodium: 138 mmol/L (ref 135–145)
Total Bilirubin: 0.6 mg/dL (ref 0.3–1.2)
Total Protein: 8 g/dL (ref 6.5–8.1)

## 2022-03-25 LAB — TSH: TSH: 1.727 u[IU]/mL (ref 0.350–4.500)

## 2022-03-25 MED ORDER — SODIUM CHLORIDE 0.9 % IV SOLN
480.0000 mg | Freq: Once | INTRAVENOUS | Status: AC
  Administered 2022-03-25: 480 mg via INTRAVENOUS
  Filled 2022-03-25: qty 48

## 2022-03-25 MED ORDER — SODIUM CHLORIDE 0.9 % IV SOLN: Freq: Once | INTRAVENOUS | Status: AC

## 2022-03-25 MED ORDER — HEPARIN SOD (PORK) LOCK FLUSH 100 UNIT/ML IV SOLN
500.0000 [IU] | Freq: Once | INTRAVENOUS | Status: AC | PRN
  Administered 2022-03-25: 500 [IU]

## 2022-03-25 MED ORDER — SODIUM CHLORIDE 0.9% FLUSH
10.0000 mL | INTRAVENOUS | Status: DC | PRN
  Administered 2022-03-25: 10 mL

## 2022-03-25 NOTE — Patient Instructions (Signed)
[facility]  Discharge Instructions: Thank you for choosing [facility] to provide your oncology and hematology care.  If you have a lab appointment with the Cancer Center, please come in thru the Main Entrance and check in at the main information desk.  Wear comfortable clothing and clothing appropriate for easy access to any Portacath or PICC line.   We strive to give you quality time with your provider. You may need to reschedule your appointment if you arrive late (15 or more minutes).  Arriving late affects you and other patients whose appointments are after yours.  Also, if you miss three or more appointments without notifying the office, you may be dismissed from the clinic at the provider's discretion.      For prescription refill requests, have your pharmacy contact our office and allow 72 hours for refills to be completed.        To help prevent nausea and vomiting after your treatment, we encourage you to take your nausea medication as directed.  BELOW ARE SYMPTOMS THAT SHOULD BE REPORTED IMMEDIATELY: *FEVER GREATER THAN 100.4 F (38 C) OR HIGHER *CHILLS OR SWEATING *NAUSEA AND VOMITING THAT IS NOT CONTROLLED WITH YOUR NAUSEA MEDICATION *UNUSUAL SHORTNESS OF BREATH *UNUSUAL BRUISING OR BLEEDING *URINARY PROBLEMS (pain or burning when urinating, or frequent urination) *BOWEL PROBLEMS (unusual diarrhea, constipation, pain near the anus) TENDERNESS IN MOUTH AND THROAT WITH OR WITHOUT PRESENCE OF ULCERS (sore throat, sores in mouth, or a toothache) UNUSUAL RASH, SWELLING OR PAIN  UNUSUAL VAGINAL DISCHARGE OR ITCHING   Items with * indicate a potential emergency and should be followed up as soon as possible or go to the Emergency Department if any problems should occur.  Please show the CHEMOTHERAPY ALERT CARD or IMMUNOTHERAPY ALERT CARD at check-in to the Emergency Department and triage nurse.  Should you have questions after your visit or need to cancel  or reschedule your appointment, please contact [facility] [phone]  and follow the prompts.  Office hours are 8:00 a.m. to 4:30 p.m. Monday - Friday. Please note that voicemails left after 4:00 p.m. may not be returned until the following business day.  We are closed weekends and major holidays. You have access to a nurse at all times for urgent questions. Please call the main number to the clinic [phone] and follow the prompts.  For any non-urgent questions, you may also contact your provider using MyChart. We now offer e-Visits for anyone 18 and older to request care online for non-urgent symptoms. For details visit [email or web address].   Also download the MyChart app! Go to the app store, search "MyChart", open the app, select [facility], and log in with your MyChart username and password.  Due to Covid, a mask is required upon entering the hospital/clinic. If you do not have a mask, one will be given to you upon arrival. For doctor visits, patients may have 1 support person aged 18 or older with them. For treatment visits, patients cannot have anyone with them due to current Covid guidelines and our immunocompromised population.  

## 2022-03-25 NOTE — Progress Notes (Signed)
Patient presents today for Opdivo infusion.  Patient is in satisfactory condition with no complaints voiced.  Vital signs are stable.  Labs reviewed and all are within treatment parameters.  We will proceed with treatment per MD orders.  ? ?Patient tolerated treatment well with no complaints voiced.  Patient left ambulatory in stable condition.  Vital signs stable at discharge.  Follow up as scheduled.    ?

## 2022-03-30 ENCOUNTER — Ambulatory Visit: Payer: Medicare HMO | Admitting: Internal Medicine

## 2022-04-19 NOTE — Progress Notes (Signed)
Melissa Sullivan [address] [place] [address]   CLINIC:  Medical Oncology/Hematology  PCP:  [name], MD [address] Melissa Sullivan [phone]   REASON FOR VISIT:  Follow-up for metastatic right lung cancer  PRIOR THERAPY:  1. Bevacizumab, cisplatin and pemetrexed x 4 cycles from 09/02/2014 to 11/04/2014. 2. Bevacizumab and pemetrexed x 3 cycles from 12/24/2014 to 02/04/2015.  NGS Results: not done  CURRENT THERAPY: Nivolumab every 4 weeks  BRIEF ONCOLOGIC HISTORY:  Oncology History  Cancer of upper lobe of right lung ([place])  07/28/2014 Imaging   CT chest: Large R apical mass consistent with malignancy. This is destroying the R 2nd rib with extension into adjacent soft tissue. R hilar adenopathy with R 5cm adrenal metastatic lesion.    08/01/2014 Initial Biopsy   Lung, needle/core biopsy(ies), right upper lobe - POORLY DIFFERENTIATED ADENOCARCINOMA, SEE COMMENT.    08/08/2014 PET scan   Large hypermetabolic R apical mass with evidence of direct chest wall and mediastinal invasion, right retrocrural lymphadenopathy, extensive retroperitoneal lymphadenopathy, and metastatic lesions to the adrenal glands     09/02/2014 - 11/04/2014 Chemotherapy   Cisplatin/Pemetrexed/Avastin every 21 days x 4 cycles    10/07/2014 - 10/27/2014 Radiation Therapy   Right lung apex for control of brachioplexopathy.    12/24/2014 - 02/25/2015 Chemotherapy   Alimta/Avastin every 21 days.    02/20/2015 Imaging   Increase in size of right adrenal metastasis and subjacent confluent retrocaval lymphadenopathy    02/25/2015 -  Chemotherapy   Nivolumab, zometa    05/04/2015 Imaging   CT CAP- Stable to slight decrease in the posterior right apical lesion. Stable appearance of posterior right upper rib an upper thoracic bony lesions. Slight improvement in right upper lobe tree-in-bud opacity. No new or progressive findings in...    07/28/2015 Imaging   CT CAP-  Reduced size of the right apical pleural parenchymal lesion and reduced size of the right adrenal metastatic lesion. Resolution of prior retrocrural adenopathy.  Right eccentric T1 and T2 sclerosis with sclerosis and tapering of the right second..    11/17/2015 Imaging   CT CAP- Stable soft tissue thickening in the apex of the right hemi thorax. Stable right adrenal metastasis. Nodularity along the trachea and mainstem bronchi, relatively new from 07/28/2015, favoring adherent debris.    11/18/2015 Treatment Plan Change   Zometa HELD for upcoming tooth extraction    11/24/2015 Treatment Plan Change   Zometa on hold at this time in preparation for tooth extraction in March 2017.  Zometa las given on 11/18/2015.    02/03/2016 Imaging   CT CAP- Heterogeneous right apical masslike consolidation and right adrenal metastasis are unchanged    04/06/2016 Treatment Plan Change   Zometa restarted 6 weeks out from tooth extraction (04/06/2016)    05/12/2016 Imaging   CT CAP- NED in the chest, abdomen or pelvis.  Some areas of nodularity associated with the mainstem bronchi in the left upper lobe bronchus, favored to represent adherent inspissated secretions    08/17/2016 Imaging   CT CAP- 1. Stable CTs of the chest and abdomen. No evidence of progressive metastatic disease. 2. Probable treated tumor at the right apex, right adrenal gland and T2 vertebral body, stable. 3. Fluctuating nodularity along the walls of the trachea and mainstem bronchi, likely secretions.    12/12/2016 Imaging   Further decrease in size of treated tumor within the right apex. 2. Stable treated tumor involving the right  second rib and T2 vertebra. 3. Stable right adrenal gland treated tumor. 4. Emphysema 5. Aortic atherosclerosis    01/04/2017 Imaging   MRI brain- Normal brain MRI.  No intracranial metastatic disease.    03/15/2017 Imaging   CT CAP- 1. No new or progressive metastatic disease in the chest or  abdomen. 2. Stable treated tumor in the apical right upper lobe. Stable treated right posterior second rib and right T2 vertebral lesions. Stable treated right adrenal metastasis. 3. Aortic atherosclerosis. 4. Moderate emphysema with mild diffuse bronchial wall thickening, suggesting COPD.    06/12/2017 Imaging   CT CAP 1. Similar appearance of treated primary within the right apex. 2. Similar areas of sclerosis within the right second rib, T2, and less so T1 vertebral bodies. These are most consistent with treated metastasis. 3. Similar right adrenal treated metastasis. 4. No evidence of new or progressive disease. 5. Similar right and progressive left areas of bronchial wall thickening and probable mucoid impaction. Correlate with interval infectious symptoms. 6.  Emphysema (ICD10-J43.9). 7. Coronary artery atherosclerosis. Aortic Atherosclerosis (ICD10-I70.0).      10/16/2017 Imaging   CT CAP 1. Stable appearance of the prior Pancoast tumor and related bony findings in the right second rib and right T1 and T2 vertebra compatible with successfully treated tumor. No significant enlargement or new lesions are identified. Similarly the treated right adrenal metastatic lesion is stable in appearance. 2. Upper normal size right hilar lymph node may warrant surveillance. Currently 9 mm in short axis. 3. Other imaging findings of potential clinical significance: Aortic Atherosclerosis (ICD10-I70.0) and Emphysema (ICD10-J43.9). Scattered proximal sigmoid colon diverticula. Mucus plugging medially in the left upper lobe and posteriorly in the right upper lobe.        CANCER STAGING:  Cancer Staging  No matching staging information was found for the patient.  INTERVAL HISTORY:  Melissa Sullivan, a 66 y.o. female, returns for routine follow-up and consideration for next cycle of chemotherapy. Melissa Sullivan was last seen on 01/27/2022.  Due for cycle #58 of Nivolumab today.    Overall, she tells me she has been feeling pretty well. ***  Overall, she feels ready for next cycle of chemo today.    REVIEW OF SYSTEMS:  Review of Systems  All other systems reviewed and are negative.  PAST MEDICAL/SURGICAL HISTORY:  Past Medical History:  Diagnosis Date   Adrenal mass, right ([place]) 07/28/2014   Anemia    Bone metastases ([place]) 04/05/2016   Cancer ([place]) 2015   lung  right   Diabetes mellitus without complication ([place])    GERD (gastroesophageal reflux disease)    Hyperlipidemia    Hypertension    Hypothyroidism due to medication 01/30/2017   Lung mass 07/28/2014   Reflux    Past Surgical History:  Procedure Laterality Date   AMPUTATION Right 02/22/2017   Procedure: PARTIAL AMPUTATION RIGHT GREAT TOE;  Surgeon: Caprice Beaver, DPM;  Location: [facility];  Service: Podiatry;  Laterality: Right;   APPENDECTOMY     BIOPSY  09/22/2020   Procedure: BIOPSY;  Surgeon: Eloise Harman, DO;  Location: [facility];  Service: Endoscopy;;   COLONOSCOPY WITH PROPOFOL N/A 09/22/2020   non-bleeding internal hemorrhoids, sigmoid and descending colon diverticulosis, localized mild inflammation in sigmoid that was felt due to prep artifact.    ESOPHAGOGASTRODUODENOSCOPY N/A 12/09/2014   RSW:NIOEVO esophageal stricture/mild-to-noderate erosive gastritis. negative H.pylori   FLEXIBLE SIGMOIDOSCOPY  2011   Dr. [name]: hyperplastic polyp   LUNG BIOPSY Right 07/2014  CT guided   MALONEY DILATION N/A 12/09/2014   Procedure: MALONEY DILATION;  Surgeon: [name], MD;  Location: [facility];  Service: Endoscopy;  Laterality: N/A;   PORTACATH PLACEMENT Left 09/01/2014   SAVORY DILATION N/A 12/09/2014   Procedure: SAVORY DILATION;  Surgeon: [name], MD;  Location: [facility];  Service: Endoscopy;  Laterality: N/A;    SOCIAL HISTORY:  Social History   Socioeconomic History   Marital status: Married    Spouse name: Not on file   Number of children: Not on file    Years of education: Not on file   Highest education level: Not on file  Occupational History   Not on file  Tobacco Use   Smoking status: Light Smoker    Packs/day: 0.30    Years: 20.00    Pack years: 6.00    Types: Cigarettes   Smokeless tobacco: Never  Vaping Use   Vaping Use: Never used  Substance and Sexual Activity   Alcohol use: No   Drug use: No   Sexual activity: Not on file  Other Topics Concern   Not on file  Social History Narrative   Not on file   Social Determinants of Health   Financial Resource Strain: Not on file  Food Insecurity: Not on file  Transportation Needs: Not on file  Physical Activity: Not on file  Stress: Not on file  Social Connections: Not on file  Intimate Partner Violence: Not on file    FAMILY HISTORY:  Family History  Problem Relation Age of Onset   Cancer Sister    Colon cancer Neg Hx     CURRENT MEDICATIONS:  Current Outpatient Medications  Medication Sig Dispense Refill   albuterol (VENTOLIN HFA) 108 (90 Base) MCG/ACT inhaler Inhale 2 puffs into the lungs every 6 (six) hours as needed for wheezing or shortness of breath.     dexlansoprazole (DEXILANT) 60 MG capsule Dexilant 60 mg capsule, delayed release  TAKE 1 CAPSULE BY MOUTH IN THE MORNING WITH BREAKFAST     dronabinol (MARINOL) 2.5 MG capsule Take 1 capsule (2.5 mg total) by mouth 2 (two) times daily before lunch and supper. 60 capsule 5   ENSURE (ENSURE) Take 1 Can by mouth 4 (four) times daily.     Ferrous Sulfate (IRON) 325 (65 Fe) MG TABS Take 325 mg by mouth daily.      fluticasone (FLONASE) 50 MCG/ACT nasal spray fluticasone propionate 50 mcg/actuation nasal spray,suspension     fluticasone (FLONASE) 50 MCG/ACT nasal spray Place into both nostrils.     Fluticasone-Salmeterol (ADVAIR DISKUS) 500-50 MCG/DOSE AEPB Inhale 1 puff into the lungs 2 (two) times daily. 60 each 6   levocetirizine (XYZAL) 5 MG tablet Take 5 mg by mouth every evening.      levothyroxine  (SYNTHROID) 25 MCG tablet TAKE 1/2 (ONE-HALF) TABLET BY MOUTH ONCE DAILY BEFORE BREAKFAST 45 tablet 3   lidocaine-prilocaine (EMLA) cream Apply 1 application topically as needed (port access). 30 g 3   naloxone (NARCAN) nasal spray 4 mg/0.1 mL      Nivolumab (OPDIVO IV) Inject into the vein every 28 (twenty-eight) days.     omeprazole (PRILOSEC) 40 MG capsule omeprazole 40 mg capsule,delayed release  Take one capsule by mouth once daily about 30 minutes before breakfast     Oxycodone HCl 10 MG TABS Take 1 tablet (10 mg total) by mouth every 12 (twelve) hours as needed (pain.). 60 tablet 0   pantoprazole (PROTONIX)  40 MG tablet Take 1 tablet (40 mg total) by mouth daily. 30 minutes before breakfast 90 tablet 3   promethazine (PHENERGAN) 6.25 MG/5ML syrup      pseudoephedrine-guaifenesin (MUCINEX D) 60-600 MG 12 hr tablet Take 1 tablet by mouth daily as needed for congestion.     rosuvastatin (CRESTOR) 20 MG tablet Take 20 mg by mouth daily.     simvastatin (ZOCOR) 40 MG tablet simvastatin 40 mg tablet  TAKE 1 TABLET BY MOUTH ONCE DAILY     No current facility-administered medications for this visit.   Facility-Administered Medications Ordered in Other Visits  Medication Dose Route Frequency Provider Last Rate Last Admin   sodium chloride flush (NS) 0.9 % injection 10 mL  10 mL Intracatheter PRN Derek Jack, MD   10 mL at 01/03/19 0930    ALLERGIES:  Allergies  Allergen Reactions   Xgeva [Denosumab]     osteonecrosis    PHYSICAL EXAM:  Performance status (ECOG): 0 - Asymptomatic  There were no vitals filed for this visit. Wt Readings from Last 3 Encounters:  03/25/22 99 lb 6.8 oz (45.1 kg)  02/24/22 100 lb 8 oz (45.6 kg)  01/27/22 104 lb 3.2 oz (47.3 kg)   Physical Exam  LABORATORY DATA:  I have reviewed the labs as listed.     Latest Ref Rng & Units 03/25/2022    8:50 AM 02/24/2022    8:02 AM 01/27/2022   10:05 AM  CBC  WBC 4.0 - 10.5 K/uL 10.5   7.9   8.7     Hemoglobin 12.0 - 15.0 g/dL 14.0   13.0   14.1    Hematocrit 36.0 - 46.0 % 43.6   39.8   43.3    Platelets 150 - 400 K/uL 290   257   258        Latest Ref Rng & Units 03/25/2022    8:50 AM 02/24/2022    8:02 AM 01/27/2022    9:52 AM  CMP  Glucose 70 - 99 mg/dL 82   99   96    BUN 8 - 23 mg/dL 19   20   15     Creatinine 0.44 - 1.00 mg/dL 1.21   1.30   1.20    Sodium 135 - 145 mmol/L 138   140   138    Potassium 3.5 - 5.1 mmol/L 3.6   4.2   4.9    Chloride 98 - 111 mmol/L 105   107   104    CO2 22 - 32 mmol/L 25   25   26     Calcium 8.9 - 10.3 mg/dL 9.7   9.6   9.6    Total Protein 6.5 - 8.1 g/dL 8.0   7.3   7.9    Total Bilirubin 0.3 - 1.2 mg/dL 0.6   0.7   0.5    Alkaline Phos 38 - 126 U/L 67   59   61    AST 15 - 41 U/L 20   24   21     ALT 0 - 44 U/L 15   18   19       DIAGNOSTIC IMAGING:  I have independently reviewed the scans and discussed with the patient. No results found.   ASSESSMENT:  1. Cancer of upper lobe of right lung ([place]) Metastatic adenocarcinoma of the lung to the bones and right adrenal: -Foundation 1 testing shows 14 genomic alterations, no approved therapies. -Opdivo 480 mg monthly started  on February 25, 2015. -CT CAP on February 21, 2020 shows no evidence of progression or metastatic disease.  T2 sclerotic lesion is stable. -CTAP on 08/10/2020 with no evidence of recurrent metastatic disease in the abdomen or pelvis.  Irregular mildly enlarged right adrenal gland unchanged. -CT chest on 09/09/2020 showed partial clearing of the airspace opacities noted medially in the lingula and left lower lobe.  New irregular nodule posteriorly at the right apex likely inflammatory.  Stable sclerotic T2 vertebral body lesion. -Reviewed CT CAP from 02/19/2021 which showed stable to slight decrease in size of mediastinal lymph nodes.  Stable posttreatment changes in the left lung apex and slight decrease in size of the absent irregular nodule.  Stable sclerotic lesion in T2 vertebral body.   No new progressive findings.    PLAN:  1.  Metastatic lung adenocarcinoma: - She is tolerating Opdivo monthly very well. - Reviewed CT CAP from 01/22/2022 which showed stable posttreatment changes with no new recurrence or metastatic disease. - Reviewed labs today which shows normal CBC and LFTs and stable creatinine. - Continue nivolumab every 4 weeks.  RTC 3 months for follow-up.   2.  High risk drug monitoring: - Continue Synthroid 25 mcg daily.  TSH was 1.8.   3.  Health maintenance: - Mammogram on 06/28/2021, BI-RADS Category 1.   4.  CKD: - Baseline creatinine between 1.2-1.3.  Today creatinine 1.20.   5.  Bone metastasis: - Bisphosphonates on hold due to ONJ in 2019.  6.  Chronic pains: - Continue oxycodone as needed.  7.  Weight loss: - Continue Marinol 2.5 mg twice daily.  Weight is stable.   Orders placed this encounter:  No orders of the defined types were placed in this encounter.    Derek Jack, MD [facility] [phone]   I, Thana Ates, am acting as a scribe for Dr. Derek Jack.  {Add Barista Statement}

## 2022-04-20 DIAGNOSIS — I700 Atherosclerosis of aorta: Secondary | ICD-10-CM | POA: Diagnosis not present

## 2022-04-20 DIAGNOSIS — E039 Hypothyroidism, unspecified: Secondary | ICD-10-CM | POA: Diagnosis not present

## 2022-04-20 DIAGNOSIS — N1831 Chronic kidney disease, stage 3a: Secondary | ICD-10-CM | POA: Diagnosis not present

## 2022-04-20 DIAGNOSIS — E782 Mixed hyperlipidemia: Secondary | ICD-10-CM | POA: Diagnosis not present

## 2022-04-21 ENCOUNTER — Inpatient Hospital Stay (HOSPITAL_COMMUNITY): Payer: Medicare HMO | Attending: Hematology

## 2022-04-21 ENCOUNTER — Inpatient Hospital Stay (HOSPITAL_COMMUNITY): Payer: Medicare HMO

## 2022-04-21 ENCOUNTER — Inpatient Hospital Stay (HOSPITAL_BASED_OUTPATIENT_CLINIC_OR_DEPARTMENT_OTHER): Payer: Medicare HMO | Admitting: Hematology

## 2022-04-21 VITALS — BP 127/69 | HR 70 | Temp 97.0°F | Resp 18

## 2022-04-21 VITALS — BP 145/73 | HR 78 | Temp 97.4°F | Resp 18 | Ht 62.21 in | Wt 97.4 lb

## 2022-04-21 DIAGNOSIS — Z79899 Other long term (current) drug therapy: Secondary | ICD-10-CM | POA: Diagnosis not present

## 2022-04-21 DIAGNOSIS — C3411 Malignant neoplasm of upper lobe, right bronchus or lung: Secondary | ICD-10-CM

## 2022-04-21 DIAGNOSIS — Z79890 Hormone replacement therapy (postmenopausal): Secondary | ICD-10-CM | POA: Diagnosis not present

## 2022-04-21 DIAGNOSIS — C7951 Secondary malignant neoplasm of bone: Secondary | ICD-10-CM | POA: Diagnosis not present

## 2022-04-21 DIAGNOSIS — K8020 Calculus of gallbladder without cholecystitis without obstruction: Secondary | ICD-10-CM | POA: Diagnosis not present

## 2022-04-21 DIAGNOSIS — C7971 Secondary malignant neoplasm of right adrenal gland: Secondary | ICD-10-CM | POA: Insufficient documentation

## 2022-04-21 DIAGNOSIS — R7301 Impaired fasting glucose: Secondary | ICD-10-CM | POA: Diagnosis not present

## 2022-04-21 DIAGNOSIS — F17210 Nicotine dependence, cigarettes, uncomplicated: Secondary | ICD-10-CM | POA: Diagnosis not present

## 2022-04-21 DIAGNOSIS — G8929 Other chronic pain: Secondary | ICD-10-CM | POA: Insufficient documentation

## 2022-04-21 DIAGNOSIS — J439 Emphysema, unspecified: Secondary | ICD-10-CM | POA: Diagnosis not present

## 2022-04-21 DIAGNOSIS — N189 Chronic kidney disease, unspecified: Secondary | ICD-10-CM | POA: Diagnosis not present

## 2022-04-21 DIAGNOSIS — Z5112 Encounter for antineoplastic immunotherapy: Secondary | ICD-10-CM | POA: Insufficient documentation

## 2022-04-21 DIAGNOSIS — E278 Other specified disorders of adrenal gland: Secondary | ICD-10-CM

## 2022-04-21 DIAGNOSIS — Z79891 Long term (current) use of opiate analgesic: Secondary | ICD-10-CM | POA: Insufficient documentation

## 2022-04-21 DIAGNOSIS — I129 Hypertensive chronic kidney disease with stage 1 through stage 4 chronic kidney disease, or unspecified chronic kidney disease: Secondary | ICD-10-CM | POA: Diagnosis not present

## 2022-04-21 DIAGNOSIS — E782 Mixed hyperlipidemia: Secondary | ICD-10-CM | POA: Diagnosis not present

## 2022-04-21 LAB — COMPREHENSIVE METABOLIC PANEL
ALT: 17 U/L (ref 0–44)
AST: 20 U/L (ref 15–41)
Albumin: 3.9 g/dL (ref 3.5–5.0)
Alkaline Phosphatase: 65 U/L (ref 38–126)
Anion gap: 4 — ABNORMAL LOW (ref 5–15)
BUN: 12 mg/dL (ref 8–23)
CO2: 27 mmol/L (ref 22–32)
Calcium: 9.3 mg/dL (ref 8.9–10.3)
Chloride: 107 mmol/L (ref 98–111)
Creatinine, Ser: 1.2 mg/dL — ABNORMAL HIGH (ref 0.44–1.00)
GFR, Estimated: 50 mL/min — ABNORMAL LOW (ref >60)
Glucose, Bld: 100 mg/dL — ABNORMAL HIGH (ref 70–99)
Potassium: 4.1 mmol/L (ref 3.5–5.1)
Sodium: 138 mmol/L (ref 135–145)
Total Bilirubin: 0.2 mg/dL — ABNORMAL LOW (ref 0.3–1.2)
Total Protein: 7.6 g/dL (ref 6.5–8.1)

## 2022-04-21 LAB — CBC WITH DIFFERENTIAL/PLATELET
Abs Immature Granulocytes: 0.04 10*3/uL (ref 0.00–0.07)
Basophils Absolute: 0 10*3/uL (ref 0.0–0.1)
Basophils Relative: 1 %
Eosinophils Absolute: 0.1 10*3/uL (ref 0.0–0.5)
Eosinophils Relative: 1 %
HCT: 41.2 % (ref 36.0–46.0)
Hemoglobin: 13.3 g/dL (ref 12.0–15.0)
Immature Granulocytes: 1 %
Lymphocytes Relative: 26 %
Lymphs Abs: 2.1 10*3/uL (ref 0.7–4.0)
MCH: 30.1 pg (ref 26.0–34.0)
MCHC: 32.3 g/dL (ref 30.0–36.0)
MCV: 93.2 fL (ref 80.0–100.0)
Monocytes Absolute: 0.6 10*3/uL (ref 0.1–1.0)
Monocytes Relative: 8 %
Neutro Abs: 5.1 10*3/uL (ref 1.7–7.7)
Neutrophils Relative %: 63 %
Platelets: 279 10*3/uL (ref 150–400)
RBC: 4.42 MIL/uL (ref 3.87–5.11)
RDW: 16 % — ABNORMAL HIGH (ref 11.5–15.5)
WBC: 8 10*3/uL (ref 4.0–10.5)
nRBC: 0 % (ref 0.0–0.2)

## 2022-04-21 LAB — TSH: TSH: 1.878 u[IU]/mL (ref 0.350–4.500)

## 2022-04-21 MED ORDER — SODIUM CHLORIDE 0.9 % IV SOLN: Freq: Once | INTRAVENOUS | Status: AC

## 2022-04-21 MED ORDER — SODIUM CHLORIDE 0.9% FLUSH
10.0000 mL | INTRAVENOUS | Status: DC | PRN
  Administered 2022-04-21 (×2): 10 mL

## 2022-04-21 MED ORDER — SODIUM CHLORIDE 0.9 % IV SOLN
480.0000 mg | Freq: Once | INTRAVENOUS | Status: AC
  Administered 2022-04-21: 480 mg via INTRAVENOUS
  Filled 2022-04-21: qty 48

## 2022-04-21 MED ORDER — HEPARIN SOD (PORK) LOCK FLUSH 100 UNIT/ML IV SOLN
500.0000 [IU] | Freq: Once | INTRAVENOUS | Status: AC | PRN
  Administered 2022-04-21: 500 [IU]

## 2022-04-21 NOTE — Progress Notes (Signed)
Patient presents today for Opdivo, patient okay for treatment per Dr. Delton Coombes. Patient tolerated therapy with no complaints voiced. Side effects with management reviewed with understanding verbalized. Port site clean and dry with no bruising or swelling noted at site. Good blood return noted before and after administration of therapy. Band aid applied. Patient left in satisfactory condition with VSS and no s/s of distress noted.

## 2022-04-21 NOTE — Patient Instructions (Signed)
[facility]  Discharge Instructions: Thank you for choosing [facility] to provide your oncology and hematology care.  If you have a lab appointment with the [facility], please come in thru the Main Entrance and check in at the main information desk.  Wear comfortable clothing and clothing appropriate for easy access to any Portacath or PICC line.   We strive to give you quality time with your provider. You may need to reschedule your appointment if you arrive late (15 or more minutes).  Arriving late affects you and other patients whose appointments are after yours.  Also, if you miss three or more appointments without notifying the office, you may be dismissed from the clinic at the provider's discretion.      For prescription refill requests, have your pharmacy contact our office and allow 72 hours for refills to be completed.    Today you received the following chemotherapy and/or immunotherapy agents Opdivo, return as scheduled.   To help prevent nausea and vomiting after your treatment, we encourage you to take your nausea medication as directed.  BELOW ARE SYMPTOMS THAT SHOULD BE REPORTED IMMEDIATELY: *FEVER GREATER THAN 100.4 F (38 C) OR HIGHER *CHILLS OR SWEATING *NAUSEA AND VOMITING THAT IS NOT CONTROLLED WITH YOUR NAUSEA MEDICATION *UNUSUAL SHORTNESS OF BREATH *UNUSUAL BRUISING OR BLEEDING *URINARY PROBLEMS (pain or burning when urinating, or frequent urination) *BOWEL PROBLEMS (unusual diarrhea, constipation, pain near the anus) TENDERNESS IN MOUTH AND THROAT WITH OR WITHOUT PRESENCE OF ULCERS (sore throat, sores in mouth, or a toothache) UNUSUAL RASH, SWELLING OR PAIN  UNUSUAL VAGINAL DISCHARGE OR ITCHING   Items with * indicate a potential emergency and should be followed up as soon as possible or go to the Emergency Department if any problems should occur.  Please show the CHEMOTHERAPY ALERT CARD or IMMUNOTHERAPY ALERT CARD at check-in to the  Emergency Department and triage nurse.  Should you have questions after your visit or need to cancel or reschedule your appointment, please contact [facility] [phone]  and follow the prompts.  Office hours are 8:00 a.m. to 4:30 p.m. Monday - Friday. Please note that voicemails left after 4:00 p.m. may not be returned until the following business day.  We are closed weekends and major holidays. You have access to a nurse at all times for urgent questions. Please call the main number to the clinic [phone] and follow the prompts.  For any non-urgent questions, you may also contact your provider using MyChart. We now offer e-Visits for anyone 41 and older to request care online for non-urgent symptoms. For details visit [email or web address].   Also download the MyChart app! Go to the app store, search "MyChart", open the app, select [facility], and log in with your MyChart username and password.  Due to Covid, a mask is required upon entering the hospital/clinic. If you do not have a mask, one will be given to you upon arrival. For doctor visits, patients may have 1 support person aged 68 or older with them. For treatment visits, patients cannot have anyone with them due to current Covid guidelines and our immunocompromised population.

## 2022-04-25 DIAGNOSIS — D509 Iron deficiency anemia, unspecified: Secondary | ICD-10-CM | POA: Diagnosis not present

## 2022-04-25 DIAGNOSIS — E782 Mixed hyperlipidemia: Secondary | ICD-10-CM | POA: Diagnosis not present

## 2022-04-25 DIAGNOSIS — K219 Gastro-esophageal reflux disease without esophagitis: Secondary | ICD-10-CM | POA: Diagnosis not present

## 2022-04-25 DIAGNOSIS — M545 Low back pain, unspecified: Secondary | ICD-10-CM | POA: Diagnosis not present

## 2022-04-25 DIAGNOSIS — E440 Moderate protein-calorie malnutrition: Secondary | ICD-10-CM | POA: Diagnosis not present

## 2022-04-25 DIAGNOSIS — J4520 Mild intermittent asthma, uncomplicated: Secondary | ICD-10-CM | POA: Diagnosis not present

## 2022-04-25 DIAGNOSIS — N1831 Chronic kidney disease, stage 3a: Secondary | ICD-10-CM | POA: Diagnosis not present

## 2022-04-25 DIAGNOSIS — R7301 Impaired fasting glucose: Secondary | ICD-10-CM | POA: Diagnosis not present

## 2022-04-25 DIAGNOSIS — I700 Atherosclerosis of aorta: Secondary | ICD-10-CM | POA: Diagnosis not present

## 2022-04-25 DIAGNOSIS — C3490 Malignant neoplasm of unspecified part of unspecified bronchus or lung: Secondary | ICD-10-CM | POA: Diagnosis not present

## 2022-04-25 DIAGNOSIS — R946 Abnormal results of thyroid function studies: Secondary | ICD-10-CM | POA: Diagnosis not present

## 2022-04-26 ENCOUNTER — Other Ambulatory Visit: Payer: Self-pay | Admitting: Oncology

## 2022-04-27 ENCOUNTER — Ambulatory Visit (INDEPENDENT_AMBULATORY_CARE_PROVIDER_SITE_OTHER): Payer: Medicare HMO | Admitting: Internal Medicine

## 2022-04-27 ENCOUNTER — Encounter: Payer: Self-pay | Admitting: Internal Medicine

## 2022-04-27 VITALS — BP 132/73 | HR 99 | Temp 98.2°F | Ht 59.0 in | Wt 98.9 lb

## 2022-04-27 DIAGNOSIS — R634 Abnormal weight loss: Secondary | ICD-10-CM | POA: Diagnosis not present

## 2022-04-27 DIAGNOSIS — K219 Gastro-esophageal reflux disease without esophagitis: Secondary | ICD-10-CM | POA: Diagnosis not present

## 2022-04-27 MED ORDER — OMEPRAZOLE 40 MG PO CPDR: 40.0000 mg | DELAYED_RELEASE_CAPSULE | Freq: Every day | ORAL | 11 refills | Status: DC

## 2022-04-27 NOTE — Patient Instructions (Signed)
Continue on omeprazole 40 mg daily.  I have sent in a year supply of refills.  I am very sorry to hear about the recent passing of your sister.  I am concerned about your weight as you have lost 5 pounds since we saw you last.  Continue to work on increasing your appetite and weight.  I will see back in 1 year or sooner if needed.  It was very nice seeing you again today.  Dr. Abbey Chatters

## 2022-04-27 NOTE — Progress Notes (Signed)
Referring Provider: [name], MD Primary Care Physician:  [name], MD Primary GI:  Dr. Abbey Chatters  Chief Complaint  Patient presents with   Gastroesophageal Reflux    Patient here today for a follow up on her [name]. She says she is not currently having any issues with it as she is taking dexlansoprazole 60 mg one per day. Patient denies any current GI issue.     HPI:   Melissa Sullivan is a 66 y.o. female who presents to the clinic today for yearly follow-up visit.  History of stage IV lung adenocarcinoma to the bones and right adrenal currently in remission.  Has a history of chronic GERD previously on Dexilant unable to afford.  Now on omeprazole 40 mg daily.  States her symptoms are well controlled.   EGD in Jan 2016 with distal esophageal stricture s/p dilation, mild to moderate erosive gastritis. Negative KOH.  Denies any dysphagia odynophagia.  No chest or epigastric pain.  Last colonoscopy 09/22/2020 unremarkable besides internal hemorrhoids and diverticulosis.  Today states she is doing well.  No complaints from a GI perspective.  Does appear that she has lost 5 pounds since seen last.  States her appetite has not been good as her sister recently passed away a month ago.  States her appetite has not been good due to the grieving process.  Does state recently however, she has started to eat better.  Past Medical History:  Diagnosis Date   Adrenal mass, right ([place]) 07/28/2014   Anemia    Bone metastases 04/05/2016   Cancer ([place]) 2015   lung  right   Diabetes mellitus without complication ([place])    GERD (gastroesophageal reflux disease)    Hyperlipidemia    Hypertension    Hypothyroidism due to medication 01/30/2017   Lung mass 07/28/2014   Reflux     Past Surgical History:  Procedure Laterality Date   AMPUTATION Right 02/22/2017   Procedure: PARTIAL AMPUTATION RIGHT GREAT TOE;  Surgeon: Caprice Beaver, DPM;  Location: [facility] OR[facility];  Service: Podiatry;  Laterality: Right;    APPENDECTOMY     BIOPSY  09/22/2020   Procedure: BIOPSY;  Surgeon: Eloise Harman, DO;  Location: [facility] END[facility] SUITE;  Service: Endoscopy;;   COLONOSCOPY WITH PROPOFOL N/A 09/22/2020   non-bleeding internal hemorrhoids, sigmoid and descending colon diverticulosis, localized mild inflammation in sigmoid that was felt due to prep artifact.    ESOPHAGOGASTRODUODENOSCOPY N/A 12/09/2014   XVQ:[id number] esophageal stricture/mild-to-noderate erosive gastritis. negative H.pylori   FLEXIBLE SIGMOIDOSCOPY  2011   Dr. [name]: hyperplastic polyp   LUNG BIOPSY Right 07/2014   CT guided   MALONEY DILATION N/A 12/09/2014   Procedure: MALONEY DILATION;  Surgeon: [name], MD;  Location: [facility];  Service: Endoscopy;  Laterality: N/A;   PORTACATH PLACEMENT Left 09/01/2014   SAVORY DILATION N/A 12/09/2014   Procedure: SAVORY DILATION;  Surgeon: [name], MD;  Location: [facility];  Service: Endoscopy;  Laterality: N/A;    Current Outpatient Medications  Medication Sig Dispense Refill   albuterol (VENTOLIN HFA) 108 (90 Base) MCG/ACT inhaler Inhale 2 puffs into the lungs every 6 (six) hours as needed for wheezing or shortness of breath.     dexlansoprazole (DEXILANT) 60 MG capsule Dexilant 60 mg capsule, delayed release  TAKE 1 CAPSULE BY MOUTH IN THE MORNING WITH BREAKFAST     dronabinol (MARINOL) 2.5 MG capsule Take 1 capsule (2.5 mg total) by mouth 2 (two) times daily  before lunch and supper. 60 capsule 5   ENSURE (ENSURE) Take 1 Can by mouth 4 (four) times daily.     Ferrous Sulfate (IRON) 325 (65 Fe) MG TABS Take 325 mg by mouth daily.      fluticasone (FLONASE) 50 MCG/ACT nasal spray fluticasone propionate 50 mcg/actuation nasal spray,suspension     fluticasone (FLONASE) 50 MCG/ACT nasal spray Place into both nostrils.     Fluticasone-Salmeterol (ADVAIR DISKUS) 500-50 MCG/DOSE AEPB Inhale 1 puff into the lungs 2 (two) times daily. 60 each 6   levocetirizine (XYZAL) 5 MG tablet Take 5 mg by  mouth every evening.      levothyroxine (SYNTHROID) 25 MCG tablet TAKE 1/2 (ONE-HALF) TABLET BY MOUTH ONCE DAILY BEFORE BREAKFAST 45 tablet 3   lidocaine-prilocaine (EMLA) cream Apply 1 application topically as needed (port access). 30 g 3   naloxone (NARCAN) nasal spray 4 mg/0.1 mL      Nivolumab (OPDIVO IV) Inject into the vein every 28 (twenty-eight) days.     Oxycodone HCl 10 MG TABS Take 1 tablet (10 mg total) by mouth every 12 (twelve) hours as needed (pain.). 60 tablet 0   promethazine (PHENERGAN) 6.25 MG/5ML syrup every 4 (four) hours as needed.     pseudoephedrine-guaifenesin (MUCINEX D) 60-600 MG 12 hr tablet Take 1 tablet by mouth daily as needed for congestion.     simvastatin (ZOCOR) 40 MG tablet simvastatin 40 mg tablet  TAKE 1 TABLET BY MOUTH ONCE DAILY     No current facility-administered medications for this visit.   Facility-Administered Medications Ordered in Other Visits  Medication Dose Route Frequency Provider Last Rate Last Admin   sodium chloride flush (NS) 0.9 % injection 10 mL  10 mL Intracatheter PRN Derek Jack, MD   10 mL at 01/03/19 0930    Allergies as of 04/27/2022 - Review Complete 04/27/2022  Allergen Reaction Noted   Delton See [denosumab]  08/15/2018    Family History  Problem Relation Age of Onset   Cancer Sister    Colon cancer Neg Hx     Social History   Socioeconomic History   Marital status: Married    Spouse name: Not on file   Number of children: Not on file   Years of education: Not on file   Highest education level: Not on file  Occupational History   Not on file  Tobacco Use   Smoking status: Light Smoker    Packs/day: 0.30    Years: 20.00    Pack years: 6.00    Types: Cigarettes   Smokeless tobacco: Never  Vaping Use   Vaping Use: Never used  Substance and Sexual Activity   Alcohol use: No   Drug use: No   Sexual activity: Not on file  Other Topics Concern   Not on file  Social History Narrative   Not on file    Social Determinants of Health   Financial Resource Strain: Not on file  Food Insecurity: Not on file  Transportation Needs: Not on file  Physical Activity: Not on file  Stress: Not on file  Social Connections: Not on file    Subjective: Review of Systems  Constitutional:  Negative for chills and fever.  HENT:  Negative for congestion and hearing loss.   Eyes:  Negative for blurred vision and double vision.  Respiratory:  Negative for cough and shortness of breath.   Cardiovascular:  Negative for chest pain and palpitations.  Gastrointestinal:  Negative for abdominal pain, blood in stool,  constipation, diarrhea, heartburn, melena and vomiting.  Genitourinary:  Negative for dysuria and urgency.  Musculoskeletal:  Negative for joint pain and myalgias.  Skin:  Negative for itching and rash.  Neurological:  Negative for dizziness and headaches.  Psychiatric/Behavioral:  Negative for depression. The patient is not nervous/anxious.     Objective: BP 132/73 (BP Location: Left Arm, Patient Position: Sitting, Cuff Size: Small)   Pulse 99   Temp 98.2 F (36.8 C) (Oral)   Ht 4\' 11"  (1.499 m)   Wt 98 lb 14.4 oz (44.9 kg)   BMI 19.98 kg/m  Physical Exam Constitutional:      Appearance: Normal appearance.  HENT:     Head: Normocephalic and atraumatic.  Eyes:     Extraocular Movements: Extraocular movements intact.     Conjunctiva/sclera: Conjunctivae normal.  Cardiovascular:     Rate and Rhythm: Normal rate and regular rhythm.  Pulmonary:     Effort: Pulmonary effort is normal.     Breath sounds: Normal breath sounds.  Abdominal:     General: Bowel sounds are normal.     Palpations: Abdomen is soft.  Musculoskeletal:        General: No swelling. Normal range of motion.     Cervical back: Normal range of motion and neck supple.  Skin:    General: Skin is warm and dry.     Coloration: Skin is not jaundiced.  Neurological:     General: No focal deficit present.     Mental  Status: She is alert and oriented to person, place, and time.  Psychiatric:        Mood and Affect: Mood normal.        Behavior: Behavior normal.     Assessment: *GERD-chronic, well controlled on Omeprazole 40 mg daily *Weight loss  Plan: GERD well-controlled on omeprazole 40 mg daily.  Year supply refills sent to pharmacy today.  Discussed her weight loss with her today.  She states her appetite is starting to improve.  States she has not been eating much due to grieving the loss of her sister last month.  States she will work on increasing caloric intake.  Follow-up in 1 year or sooner if needed.  04/27/2022 10:44 AM   Disclaimer: This note was dictated with voice recognition software. Similar sounding words can inadvertently be transcribed and may not be corrected upon review.

## 2022-05-19 ENCOUNTER — Inpatient Hospital Stay (HOSPITAL_COMMUNITY): Payer: Medicare HMO

## 2022-05-19 VITALS — BP 103/67 | HR 81 | Temp 97.6°F | Resp 18 | Wt 97.8 lb

## 2022-05-19 DIAGNOSIS — F17210 Nicotine dependence, cigarettes, uncomplicated: Secondary | ICD-10-CM | POA: Diagnosis not present

## 2022-05-19 DIAGNOSIS — Z79890 Hormone replacement therapy (postmenopausal): Secondary | ICD-10-CM | POA: Diagnosis not present

## 2022-05-19 DIAGNOSIS — C3411 Malignant neoplasm of upper lobe, right bronchus or lung: Secondary | ICD-10-CM

## 2022-05-19 DIAGNOSIS — G629 Polyneuropathy, unspecified: Secondary | ICD-10-CM | POA: Diagnosis not present

## 2022-05-19 DIAGNOSIS — Z5112 Encounter for antineoplastic immunotherapy: Secondary | ICD-10-CM | POA: Diagnosis not present

## 2022-05-19 DIAGNOSIS — Z79899 Other long term (current) drug therapy: Secondary | ICD-10-CM | POA: Diagnosis not present

## 2022-05-19 DIAGNOSIS — N189 Chronic kidney disease, unspecified: Secondary | ICD-10-CM | POA: Diagnosis not present

## 2022-05-19 DIAGNOSIS — Z79891 Long term (current) use of opiate analgesic: Secondary | ICD-10-CM | POA: Diagnosis not present

## 2022-05-19 DIAGNOSIS — C7951 Secondary malignant neoplasm of bone: Secondary | ICD-10-CM | POA: Diagnosis not present

## 2022-05-19 DIAGNOSIS — J439 Emphysema, unspecified: Secondary | ICD-10-CM | POA: Diagnosis not present

## 2022-05-19 DIAGNOSIS — G8929 Other chronic pain: Secondary | ICD-10-CM | POA: Diagnosis not present

## 2022-05-19 DIAGNOSIS — E278 Other specified disorders of adrenal gland: Secondary | ICD-10-CM

## 2022-05-19 DIAGNOSIS — K8020 Calculus of gallbladder without cholecystitis without obstruction: Secondary | ICD-10-CM | POA: Diagnosis not present

## 2022-05-19 DIAGNOSIS — B351 Tinea unguium: Secondary | ICD-10-CM | POA: Diagnosis not present

## 2022-05-19 DIAGNOSIS — C7971 Secondary malignant neoplasm of right adrenal gland: Secondary | ICD-10-CM | POA: Diagnosis not present

## 2022-05-19 DIAGNOSIS — I129 Hypertensive chronic kidney disease with stage 1 through stage 4 chronic kidney disease, or unspecified chronic kidney disease: Secondary | ICD-10-CM | POA: Diagnosis not present

## 2022-05-19 LAB — COMPREHENSIVE METABOLIC PANEL
ALT: 18 U/L (ref 0–44)
AST: 20 U/L (ref 15–41)
Albumin: 3.7 g/dL (ref 3.5–5.0)
Alkaline Phosphatase: 71 U/L (ref 38–126)
Anion gap: 5 (ref 5–15)
BUN: 13 mg/dL (ref 8–23)
CO2: 26 mmol/L (ref 22–32)
Calcium: 9.5 mg/dL (ref 8.9–10.3)
Chloride: 107 mmol/L (ref 98–111)
Creatinine, Ser: 1.24 mg/dL — ABNORMAL HIGH (ref 0.44–1.00)
GFR, Estimated: 48 mL/min — ABNORMAL LOW (ref >60)
Glucose, Bld: 110 mg/dL — ABNORMAL HIGH (ref 70–99)
Potassium: 4.1 mmol/L (ref 3.5–5.1)
Sodium: 138 mmol/L (ref 135–145)
Total Bilirubin: 0.3 mg/dL (ref 0.3–1.2)
Total Protein: 7.5 g/dL (ref 6.5–8.1)

## 2022-05-19 LAB — CBC WITH DIFFERENTIAL/PLATELET
Abs Immature Granulocytes: 0.03 10*3/uL (ref 0.00–0.07)
Basophils Absolute: 0 10*3/uL (ref 0.0–0.1)
Basophils Relative: 0 %
Eosinophils Absolute: 0.1 10*3/uL (ref 0.0–0.5)
Eosinophils Relative: 1 %
HCT: 42.4 % (ref 36.0–46.0)
Hemoglobin: 13.8 g/dL (ref 12.0–15.0)
Immature Granulocytes: 0 %
Lymphocytes Relative: 19 %
Lymphs Abs: 1.8 10*3/uL (ref 0.7–4.0)
MCH: 30.4 pg (ref 26.0–34.0)
MCHC: 32.5 g/dL (ref 30.0–36.0)
MCV: 93.4 fL (ref 80.0–100.0)
Monocytes Absolute: 0.6 10*3/uL (ref 0.1–1.0)
Monocytes Relative: 7 %
Neutro Abs: 6.7 10*3/uL (ref 1.7–7.7)
Neutrophils Relative %: 73 %
Platelets: 300 10*3/uL (ref 150–400)
RBC: 4.54 MIL/uL (ref 3.87–5.11)
RDW: 15.5 % (ref 11.5–15.5)
WBC: 9.3 10*3/uL (ref 4.0–10.5)
nRBC: 0 % (ref 0.0–0.2)

## 2022-05-19 LAB — TSH: TSH: 1.669 u[IU]/mL (ref 0.350–4.500)

## 2022-05-19 MED ORDER — HEPARIN SOD (PORK) LOCK FLUSH 100 UNIT/ML IV SOLN
500.0000 [IU] | Freq: Once | INTRAVENOUS | Status: AC | PRN
  Administered 2022-05-19: 500 [IU]

## 2022-05-19 MED ORDER — SODIUM CHLORIDE 0.9% FLUSH
10.0000 mL | INTRAVENOUS | Status: DC | PRN
  Administered 2022-05-19: 10 mL

## 2022-05-19 MED ORDER — SODIUM CHLORIDE 0.9 % IV SOLN: Freq: Once | INTRAVENOUS | Status: AC

## 2022-05-19 MED ORDER — SODIUM CHLORIDE 0.9 % IV SOLN
480.0000 mg | Freq: Once | INTRAVENOUS | Status: AC
  Administered 2022-05-19: 480 mg via INTRAVENOUS
  Filled 2022-05-19: qty 48

## 2022-05-19 NOTE — Patient Instructions (Signed)
[facility]  Discharge Instructions: Thank you for choosing [facility] to provide your oncology and hematology care.  If you have a lab appointment with the [facility], please come in thru the Main Entrance and check in at the main information desk.  Wear comfortable clothing and clothing appropriate for easy access to any Portacath or PICC line.   We strive to give you quality time with your provider. You may need to reschedule your appointment if you arrive late (15 or more minutes).  Arriving late affects you and other patients whose appointments are after yours.  Also, if you miss three or more appointments without notifying the office, you may be dismissed from the clinic at the provider's discretion.      For prescription refill requests, have your pharmacy contact our office and allow 72 hours for refills to be completed.    Today you received the following chemotherapy and/or immunotherapy agents Opdivo      To help prevent nausea and vomiting after your treatment, we encourage you to take your nausea medication as directed.  BELOW ARE SYMPTOMS THAT SHOULD BE REPORTED IMMEDIATELY: *FEVER GREATER THAN 100.4 F (38 C) OR HIGHER *CHILLS OR SWEATING *NAUSEA AND VOMITING THAT IS NOT CONTROLLED WITH YOUR NAUSEA MEDICATION *UNUSUAL SHORTNESS OF BREATH *UNUSUAL BRUISING OR BLEEDING *URINARY PROBLEMS (pain or burning when urinating, or frequent urination) *BOWEL PROBLEMS (unusual diarrhea, constipation, pain near the anus) TENDERNESS IN MOUTH AND THROAT WITH OR WITHOUT PRESENCE OF ULCERS (sore throat, sores in mouth, or a toothache) UNUSUAL RASH, SWELLING OR PAIN  UNUSUAL VAGINAL DISCHARGE OR ITCHING   Items with * indicate a potential emergency and should be followed up as soon as possible or go to the Emergency Department if any problems should occur.  Please show the CHEMOTHERAPY ALERT CARD or IMMUNOTHERAPY ALERT CARD at check-in to the Emergency  Department and triage nurse.  Should you have questions after your visit or need to cancel or reschedule your appointment, please contact [facility] [phone]  and follow the prompts.  Office hours are 8:00 a.m. to 4:30 p.m. Monday - Friday. Please note that voicemails left after 4:00 p.m. may not be returned until the following business day.  We are closed weekends and major holidays. You have access to a nurse at all times for urgent questions. Please call the main number to the clinic [phone] and follow the prompts.  For any non-urgent questions, you may also contact your provider using MyChart. We now offer e-Visits for anyone 27 and older to request care online for non-urgent symptoms. For details visit [email or web address].   Also download the MyChart app! Go to the app store, search "MyChart", open the app, select , and log in with your MyChart username and password.  Masks are optional in the cancer centers. If you would like for your care team to wear a mask while they are taking care of you, please let them know. For doctor visits, patients may have with them one support person who is at least 66 years old. At this time, visitors are not allowed in the infusion area.

## 2022-05-19 NOTE — Progress Notes (Signed)
Patient presents today for Opdivo infusion per providers order.  Vital signs and labs within parameters for treatment.  Patient has no new complaints at this time.  Opdivo given today per MD orders.  Stable during infusion without adverse affects.  Vital signs stable.  No complaints at this time.  Discharge from clinic ambulatory in stable condition.  Alert and oriented X 3.  Follow up with [facility] as scheduled.

## 2022-06-13 ENCOUNTER — Other Ambulatory Visit: Payer: Self-pay

## 2022-06-16 ENCOUNTER — Inpatient Hospital Stay (HOSPITAL_COMMUNITY): Payer: Medicare HMO | Attending: Hematology

## 2022-06-16 ENCOUNTER — Encounter (HOSPITAL_COMMUNITY): Payer: Self-pay

## 2022-06-16 ENCOUNTER — Other Ambulatory Visit (HOSPITAL_COMMUNITY): Payer: Self-pay

## 2022-06-16 ENCOUNTER — Encounter (HOSPITAL_COMMUNITY): Payer: Self-pay | Admitting: *Deleted

## 2022-06-16 ENCOUNTER — Inpatient Hospital Stay (HOSPITAL_COMMUNITY): Payer: Medicare HMO

## 2022-06-16 VITALS — BP 115/67 | HR 75 | Temp 97.5°F | Resp 18 | Wt 96.6 lb

## 2022-06-16 DIAGNOSIS — C7951 Secondary malignant neoplasm of bone: Secondary | ICD-10-CM | POA: Insufficient documentation

## 2022-06-16 DIAGNOSIS — F17210 Nicotine dependence, cigarettes, uncomplicated: Secondary | ICD-10-CM | POA: Diagnosis not present

## 2022-06-16 DIAGNOSIS — K8020 Calculus of gallbladder without cholecystitis without obstruction: Secondary | ICD-10-CM | POA: Insufficient documentation

## 2022-06-16 DIAGNOSIS — C7971 Secondary malignant neoplasm of right adrenal gland: Secondary | ICD-10-CM | POA: Insufficient documentation

## 2022-06-16 DIAGNOSIS — Z79899 Other long term (current) drug therapy: Secondary | ICD-10-CM | POA: Insufficient documentation

## 2022-06-16 DIAGNOSIS — N189 Chronic kidney disease, unspecified: Secondary | ICD-10-CM | POA: Diagnosis not present

## 2022-06-16 DIAGNOSIS — Z5112 Encounter for antineoplastic immunotherapy: Secondary | ICD-10-CM | POA: Diagnosis not present

## 2022-06-16 DIAGNOSIS — C3411 Malignant neoplasm of upper lobe, right bronchus or lung: Secondary | ICD-10-CM

## 2022-06-16 DIAGNOSIS — Z79890 Hormone replacement therapy (postmenopausal): Secondary | ICD-10-CM | POA: Insufficient documentation

## 2022-06-16 DIAGNOSIS — Z79891 Long term (current) use of opiate analgesic: Secondary | ICD-10-CM | POA: Diagnosis not present

## 2022-06-16 DIAGNOSIS — I129 Hypertensive chronic kidney disease with stage 1 through stage 4 chronic kidney disease, or unspecified chronic kidney disease: Secondary | ICD-10-CM | POA: Insufficient documentation

## 2022-06-16 DIAGNOSIS — G8929 Other chronic pain: Secondary | ICD-10-CM | POA: Diagnosis not present

## 2022-06-16 DIAGNOSIS — J439 Emphysema, unspecified: Secondary | ICD-10-CM | POA: Insufficient documentation

## 2022-06-16 DIAGNOSIS — E278 Other specified disorders of adrenal gland: Secondary | ICD-10-CM

## 2022-06-16 LAB — CBC WITH DIFFERENTIAL/PLATELET
Abs Immature Granulocytes: 0.02 10*3/uL (ref 0.00–0.07)
Basophils Absolute: 0 10*3/uL (ref 0.0–0.1)
Basophils Relative: 0 %
Eosinophils Absolute: 0.1 10*3/uL (ref 0.0–0.5)
Eosinophils Relative: 1 %
HCT: 40.4 % (ref 36.0–46.0)
Hemoglobin: 13.3 g/dL (ref 12.0–15.0)
Immature Granulocytes: 0 %
Lymphocytes Relative: 20 %
Lymphs Abs: 1.8 10*3/uL (ref 0.7–4.0)
MCH: 30.9 pg (ref 26.0–34.0)
MCHC: 32.9 g/dL (ref 30.0–36.0)
MCV: 93.7 fL (ref 80.0–100.0)
Monocytes Absolute: 0.6 10*3/uL (ref 0.1–1.0)
Monocytes Relative: 7 %
Neutro Abs: 6.9 10*3/uL (ref 1.7–7.7)
Neutrophils Relative %: 72 %
Platelets: 254 10*3/uL (ref 150–400)
RBC: 4.31 MIL/uL (ref 3.87–5.11)
RDW: 15.8 % — ABNORMAL HIGH (ref 11.5–15.5)
WBC: 9.4 10*3/uL (ref 4.0–10.5)
nRBC: 0 % (ref 0.0–0.2)

## 2022-06-16 LAB — COMPREHENSIVE METABOLIC PANEL
ALT: 16 U/L (ref 0–44)
AST: 18 U/L (ref 15–41)
Albumin: 3.9 g/dL (ref 3.5–5.0)
Alkaline Phosphatase: 54 U/L (ref 38–126)
Anion gap: 6 (ref 5–15)
BUN: 12 mg/dL (ref 8–23)
CO2: 25 mmol/L (ref 22–32)
Calcium: 9.4 mg/dL (ref 8.9–10.3)
Chloride: 107 mmol/L (ref 98–111)
Creatinine, Ser: 1.19 mg/dL — ABNORMAL HIGH (ref 0.44–1.00)
GFR, Estimated: 51 mL/min — ABNORMAL LOW (ref >60)
Glucose, Bld: 109 mg/dL — ABNORMAL HIGH (ref 70–99)
Potassium: 3.5 mmol/L (ref 3.5–5.1)
Sodium: 138 mmol/L (ref 135–145)
Total Bilirubin: 0.3 mg/dL (ref 0.3–1.2)
Total Protein: 7.2 g/dL (ref 6.5–8.1)

## 2022-06-16 LAB — LACTATE DEHYDROGENASE: LDH: 119 U/L (ref 98–192)

## 2022-06-16 LAB — TSH: TSH: 1.601 u[IU]/mL (ref 0.350–4.500)

## 2022-06-16 MED ORDER — HEPARIN SOD (PORK) LOCK FLUSH 100 UNIT/ML IV SOLN
500.0000 [IU] | Freq: Once | INTRAVENOUS | Status: AC | PRN
  Administered 2022-06-16: 500 [IU]

## 2022-06-16 MED ORDER — SODIUM CHLORIDE 0.9 % IV SOLN: Freq: Once | INTRAVENOUS | Status: AC

## 2022-06-16 MED ORDER — OXYCODONE HCL 10 MG PO TABS: 10.0000 mg | ORAL_TABLET | Freq: Two times a day (BID) | ORAL | 0 refills | Status: DC | PRN

## 2022-06-16 MED ORDER — SODIUM CHLORIDE 0.9% FLUSH
10.0000 mL | INTRAVENOUS | Status: DC | PRN
  Administered 2022-06-16: 10 mL

## 2022-06-16 MED ORDER — SODIUM CHLORIDE 0.9 % IV SOLN
480.0000 mg | Freq: Once | INTRAVENOUS | Status: AC
  Administered 2022-06-16: 480 mg via INTRAVENOUS
  Filled 2022-06-16: qty 48

## 2022-06-16 NOTE — Progress Notes (Signed)
Patient presents today for treatment. Vital signs within parameters for treatment. Labs within parameters for treatment. MAR reviewed and updated.   Opdivo given today per MD orders. Tolerated infusion without adverse affects. Vital signs stable. No complaints at this time. Discharged from clinic ambulatory in stable condition. Alert and oriented x 3. F/U with [facility] as scheduled.

## 2022-06-16 NOTE — Patient Instructions (Signed)
[place]  Discharge Instructions: Thank you for choosing [facility] to provide your oncology and hematology care.  If you have a lab appointment with the [facility], please come in thru the Main Entrance and check in at the main information desk.  Wear comfortable clothing and clothing appropriate for easy access to any Portacath or PICC line.   We strive to give you quality time with your provider. You may need to reschedule your appointment if you arrive late (15 or more minutes).  Arriving late affects you and other patients whose appointments are after yours.  Also, if you miss three or more appointments without notifying the office, you may be dismissed from the clinic at the provider's discretion.      For prescription refill requests, have your pharmacy contact our office and allow 72 hours for refills to be completed.    Today you received the following chemotherapy and/or immunotherapy agents Opdivo. Nivolumab injection What is this medication? NIVOLUMAB (nye VOL ue mab) is a monoclonal antibody. It treats certain types of cancer. Some of the cancers treated are colon cancer, head and neck cancer, Hodgkin lymphoma, lung cancer, and melanoma. This medicine may be used for other purposes; ask your health care provider or pharmacist if you have questions. COMMON BRAND NAME(S): Opdivo What should I tell my care team before I take this medication? They need to know if you have any of these conditions: Autoimmune diseases such as Crohn's disease, ulcerative colitis, or lupus Have had or planning to have an allogeneic stem cell transplant (uses someone else's stem cells) History of chest radiation Organ transplant Nervous system problems such as myasthenia gravis or Guillain-Barre syndrome An unusual or allergic reaction to nivolumab, other medicines, foods, dyes, or preservatives Pregnant or trying to get pregnant Breast-feeding How should I use this  medication? This medication is injected into a vein. It is given in a hospital or clinic setting. A special MedGuide will be given to you before each treatment. Be sure to read this information carefully each time. Talk to your care team regarding the use of this medication in children. While it may be prescribed for children as young as 12 years for selected conditions, precautions do apply. Overdosage: If you think you have taken too much of this medicine contact a poison control center or emergency room at once. NOTE: This medicine is only for you. Do not share this medicine with others. What if I miss a dose? Keep appointments for follow-up doses. It is important not to miss your dose. Call your care team if you are unable to keep an appointment. What may interact with this medication? Interactions have not been studied. This list may not describe all possible interactions. Give your health care provider a list of all the medicines, herbs, non-prescription drugs, or dietary supplements you use. Also tell them if you smoke, drink alcohol, or use illegal drugs. Some items may interact with your medicine. What should I watch for while using this medication? Your condition will be monitored carefully while you are receiving this medication. You may need blood work done while you are taking this medication. Do not become pregnant while taking this medication or for 5 months after stopping it. Women should inform their care team if they wish to become pregnant or think they might be pregnant. There is a potential for serious harm to an unborn child. Talk to your care team for more information. Do not breast-feed an infant  while taking this medication or for 5 months after stopping it. What side effects may I notice from receiving this medication? Side effects that you should report to your care team as soon as possible: Allergic reactions--skin rash, itching, hives, swelling of the face, lips, tongue,  or throat Bloody or black, tar-like stools Change in vision Chest pain Diarrhea Dry cough, shortness of breath or trouble breathing Eye pain Fast or irregular heartbeat Fever, chills High blood sugar (hyperglycemia)--increased thirst or amount of urine, unusual weakness or fatigue, blurry vision High thyroid levels (hyperthyroidism)--fast or irregular heartbeat, weight loss, excessive sweating or sensitivity to heat, tremors or shaking, anxiety, nervousness, irregular menstrual cycle or spotting Kidney injury--decrease in the amount of urine, swelling of the ankles, hands, or feet Liver injury--right upper belly pain, loss of appetite, nausea, light-colored stool, dark yellow or brown urine, yellowing skin or eyes, unusual weakness or fatigue Low red blood cell count--unusual weakness or fatigue, dizziness, headache, trouble breathing Low thyroid levels (hypothyroidism)--unusual weakness or fatigue, increased sensitivity to cold, constipation, hair loss, dry skin, weight gain, feelings of depression Mood and behavior changes-confusion, change in sex drive or performance, irritability Muscle pain or cramps Pain, tingling, or numbness in the hands or feet, muscle weakness, trouble walking, loss of balance or coordination Red or dark brown urine Redness, blistering, peeling, or loosening of the skin, including inside the mouth Stomach pain Unusual bruising or bleeding Side effects that usually do not require medical attention (report to your care team if they continue or are bothersome): Bone pain Constipation Loss of appetite Nausea Tiredness Vomiting This list may not describe all possible side effects. Call your doctor for medical advice about side effects. You may report side effects to FDA at 1-800-FDA-1088. Where should I keep my medication? This medication is given in a hospital or clinic and will not be stored at home. NOTE: This sheet is a summary. It may not cover all possible  information. If you have questions about this medicine, talk to your doctor, pharmacist, or health care provider.  2023 Elsevier/Gold Standard (2021-10-08 00:00:00)       To help prevent nausea and vomiting after your treatment, we encourage you to take your nausea medication as directed.  BELOW ARE SYMPTOMS THAT SHOULD BE REPORTED IMMEDIATELY: *FEVER GREATER THAN 100.4 F (38 C) OR HIGHER *CHILLS OR SWEATING *NAUSEA AND VOMITING THAT IS NOT CONTROLLED WITH YOUR NAUSEA MEDICATION *UNUSUAL SHORTNESS OF BREATH *UNUSUAL BRUISING OR BLEEDING *URINARY PROBLEMS (pain or burning when urinating, or frequent urination) *BOWEL PROBLEMS (unusual diarrhea, constipation, pain near the anus) TENDERNESS IN MOUTH AND THROAT WITH OR WITHOUT PRESENCE OF ULCERS (sore throat, sores in mouth, or a toothache) UNUSUAL RASH, SWELLING OR PAIN  UNUSUAL VAGINAL DISCHARGE OR ITCHING   Items with * indicate a potential emergency and should be followed up as soon as possible or go to the Emergency Department if any problems should occur.  Please show the CHEMOTHERAPY ALERT CARD or IMMUNOTHERAPY ALERT CARD at check-in to the Emergency Department and triage nurse.  Should you have questions after your visit or need to cancel or reschedule your appointment, please contact [facility] [phone]  and follow the prompts.  Office hours are 8:00 a.m. to 4:30 p.m. Monday - Friday. Please note that voicemails left after 4:00 p.m. may not be returned until the following business day.  We are closed weekends and major holidays. You have access to a nurse at all times for urgent questions. Please call the  main number to the clinic [phone] and follow the prompts.  For any non-urgent questions, you may also contact your provider using MyChart. We now offer e-Visits for anyone 3 and older to request care online for non-urgent symptoms. For details visit [email or web address].   Also download the MyChart app! Go  to the app store, search "MyChart", open the app, select [facility], and log in with your MyChart username and password.  Masks are optional in the cancer centers. If you would like for your care team to wear a mask while they are taking care of you, please let them know. For doctor visits, patients may have with them one support person who is at least 65 years old. At this time, visitors are not allowed in the infusion area.

## 2022-06-21 ENCOUNTER — Other Ambulatory Visit: Payer: Self-pay

## 2022-06-28 ENCOUNTER — Encounter: Payer: Self-pay | Admitting: *Deleted

## 2022-06-28 NOTE — Progress Notes (Signed)
Approval from [facility] for Dronabinol capsules received and is in effect until 11/20/22.

## 2022-07-07 ENCOUNTER — Other Ambulatory Visit (HOSPITAL_COMMUNITY): Payer: Self-pay | Admitting: Internal Medicine

## 2022-07-07 ENCOUNTER — Ambulatory Visit (HOSPITAL_COMMUNITY)
Admission: RE | Admit: 2022-07-07 | Discharge: 2022-07-07 | Disposition: A | Discharge status: Home / Self Care | Payer: Medicare HMO | Source: Ambulatory Visit | Attending: Hematology | Admitting: Hematology

## 2022-07-07 DIAGNOSIS — C3411 Malignant neoplasm of upper lobe, right bronchus or lung: Secondary | ICD-10-CM | POA: Diagnosis not present

## 2022-07-07 DIAGNOSIS — K8020 Calculus of gallbladder without cholecystitis without obstruction: Secondary | ICD-10-CM | POA: Diagnosis not present

## 2022-07-07 DIAGNOSIS — J432 Centrilobular emphysema: Secondary | ICD-10-CM | POA: Diagnosis not present

## 2022-07-07 DIAGNOSIS — E2749 Other adrenocortical insufficiency: Secondary | ICD-10-CM | POA: Diagnosis not present

## 2022-07-07 DIAGNOSIS — C3490 Malignant neoplasm of unspecified part of unspecified bronchus or lung: Secondary | ICD-10-CM | POA: Diagnosis not present

## 2022-07-07 DIAGNOSIS — I700 Atherosclerosis of aorta: Secondary | ICD-10-CM | POA: Diagnosis not present

## 2022-07-07 DIAGNOSIS — Z1231 Encounter for screening mammogram for malignant neoplasm of breast: Secondary | ICD-10-CM

## 2022-07-07 MED ORDER — IOHEXOL 300 MG/ML  SOLN
80.0000 mL | Freq: Once | INTRAMUSCULAR | Status: AC | PRN
Start: 2022-07-07 — End: 2022-07-07
  Administered 2022-07-07: 80 mL via INTRAVENOUS

## 2022-07-14 ENCOUNTER — Inpatient Hospital Stay: Payer: Medicare HMO | Attending: Hematology

## 2022-07-14 ENCOUNTER — Encounter: Payer: Self-pay | Admitting: *Deleted

## 2022-07-14 ENCOUNTER — Inpatient Hospital Stay: Payer: Medicare HMO

## 2022-07-14 ENCOUNTER — Other Ambulatory Visit: Payer: Self-pay | Admitting: *Deleted

## 2022-07-14 ENCOUNTER — Inpatient Hospital Stay (HOSPITAL_BASED_OUTPATIENT_CLINIC_OR_DEPARTMENT_OTHER): Payer: Medicare HMO | Admitting: Hematology

## 2022-07-14 VITALS — BP 111/66 | HR 79 | Temp 96.6°F | Resp 18

## 2022-07-14 VITALS — BP 111/60 | HR 89 | Temp 98.2°F | Resp 18 | Ht 59.0 in | Wt 96.9 lb

## 2022-07-14 DIAGNOSIS — C7971 Secondary malignant neoplasm of right adrenal gland: Secondary | ICD-10-CM | POA: Diagnosis not present

## 2022-07-14 DIAGNOSIS — C7951 Secondary malignant neoplasm of bone: Secondary | ICD-10-CM | POA: Diagnosis not present

## 2022-07-14 DIAGNOSIS — I129 Hypertensive chronic kidney disease with stage 1 through stage 4 chronic kidney disease, or unspecified chronic kidney disease: Secondary | ICD-10-CM | POA: Insufficient documentation

## 2022-07-14 DIAGNOSIS — R634 Abnormal weight loss: Secondary | ICD-10-CM | POA: Insufficient documentation

## 2022-07-14 DIAGNOSIS — C3411 Malignant neoplasm of upper lobe, right bronchus or lung: Secondary | ICD-10-CM

## 2022-07-14 DIAGNOSIS — Z5112 Encounter for antineoplastic immunotherapy: Secondary | ICD-10-CM | POA: Insufficient documentation

## 2022-07-14 DIAGNOSIS — J439 Emphysema, unspecified: Secondary | ICD-10-CM | POA: Diagnosis not present

## 2022-07-14 DIAGNOSIS — Z79890 Hormone replacement therapy (postmenopausal): Secondary | ICD-10-CM | POA: Diagnosis not present

## 2022-07-14 DIAGNOSIS — R630 Anorexia: Secondary | ICD-10-CM

## 2022-07-14 DIAGNOSIS — G8929 Other chronic pain: Secondary | ICD-10-CM | POA: Insufficient documentation

## 2022-07-14 DIAGNOSIS — K8020 Calculus of gallbladder without cholecystitis without obstruction: Secondary | ICD-10-CM | POA: Diagnosis not present

## 2022-07-14 DIAGNOSIS — F17210 Nicotine dependence, cigarettes, uncomplicated: Secondary | ICD-10-CM | POA: Diagnosis not present

## 2022-07-14 DIAGNOSIS — Z79891 Long term (current) use of opiate analgesic: Secondary | ICD-10-CM | POA: Insufficient documentation

## 2022-07-14 DIAGNOSIS — N189 Chronic kidney disease, unspecified: Secondary | ICD-10-CM | POA: Insufficient documentation

## 2022-07-14 DIAGNOSIS — Z79899 Other long term (current) drug therapy: Secondary | ICD-10-CM | POA: Insufficient documentation

## 2022-07-14 LAB — COMPREHENSIVE METABOLIC PANEL
ALT: 15 U/L (ref 0–44)
AST: 18 U/L (ref 15–41)
Albumin: 4 g/dL (ref 3.5–5.0)
Alkaline Phosphatase: 63 U/L (ref 38–126)
Anion gap: 5 (ref 5–15)
BUN: 12 mg/dL (ref 8–23)
CO2: 25 mmol/L (ref 22–32)
Calcium: 9.5 mg/dL (ref 8.9–10.3)
Chloride: 108 mmol/L (ref 98–111)
Creatinine, Ser: 1.23 mg/dL — ABNORMAL HIGH (ref 0.44–1.00)
GFR, Estimated: 49 mL/min — ABNORMAL LOW (ref >60)
Glucose, Bld: 106 mg/dL — ABNORMAL HIGH (ref 70–99)
Potassium: 3.8 mmol/L (ref 3.5–5.1)
Sodium: 138 mmol/L (ref 135–145)
Total Bilirubin: 0.8 mg/dL (ref 0.3–1.2)
Total Protein: 7.5 g/dL (ref 6.5–8.1)

## 2022-07-14 LAB — CBC WITH DIFFERENTIAL/PLATELET
Abs Immature Granulocytes: 0.02 10*3/uL (ref 0.00–0.07)
Basophils Absolute: 0 10*3/uL (ref 0.0–0.1)
Basophils Relative: 0 %
Eosinophils Absolute: 0.1 10*3/uL (ref 0.0–0.5)
Eosinophils Relative: 1 %
HCT: 41.2 % (ref 36.0–46.0)
Hemoglobin: 13.8 g/dL (ref 12.0–15.0)
Immature Granulocytes: 0 %
Lymphocytes Relative: 19 %
Lymphs Abs: 1.9 10*3/uL (ref 0.7–4.0)
MCH: 31.1 pg (ref 26.0–34.0)
MCHC: 33.5 g/dL (ref 30.0–36.0)
MCV: 92.8 fL (ref 80.0–100.0)
Monocytes Absolute: 0.5 10*3/uL (ref 0.1–1.0)
Monocytes Relative: 5 %
Neutro Abs: 7.3 10*3/uL (ref 1.7–7.7)
Neutrophils Relative %: 75 %
Platelets: 262 10*3/uL (ref 150–400)
RBC: 4.44 MIL/uL (ref 3.87–5.11)
RDW: 15.7 % — ABNORMAL HIGH (ref 11.5–15.5)
WBC: 9.8 10*3/uL (ref 4.0–10.5)
nRBC: 0 % (ref 0.0–0.2)

## 2022-07-14 LAB — TSH: TSH: 1.966 u[IU]/mL (ref 0.350–4.500)

## 2022-07-14 MED ORDER — LIDOCAINE-PRILOCAINE 2.5-2.5 % EX CREA: 1.0000 | TOPICAL_CREAM | CUTANEOUS | 3 refills | Status: DC | PRN

## 2022-07-14 MED ORDER — SODIUM CHLORIDE 0.9% FLUSH
10.0000 mL | INTRAVENOUS | Status: DC | PRN
  Administered 2022-07-14: 10 mL

## 2022-07-14 MED ORDER — DRONABINOL 2.5 MG PO CAPS: 2.5000 mg | ORAL_CAPSULE | Freq: Two times a day (BID) | ORAL | 5 refills | Status: DC

## 2022-07-14 MED ORDER — HEPARIN SOD (PORK) LOCK FLUSH 100 UNIT/ML IV SOLN
500.0000 [IU] | Freq: Once | INTRAVENOUS | Status: AC | PRN
  Administered 2022-07-14: 500 [IU]

## 2022-07-14 MED ORDER — SODIUM CHLORIDE 0.9 % IV SOLN: Freq: Once | INTRAVENOUS | Status: AC

## 2022-07-14 MED ORDER — SODIUM CHLORIDE 0.9 % IV SOLN
480.0000 mg | Freq: Once | INTRAVENOUS | Status: AC
  Administered 2022-07-14: 480 mg via INTRAVENOUS
  Filled 2022-07-14: qty 48

## 2022-07-14 NOTE — Progress Notes (Signed)
Patient has been examined by Dr. Katragadda, and vital signs and labs have been reviewed. ANC, Creatinine, LFTs, hemoglobin, and platelets are within treatment parameters per M.D. - pt may proceed with treatment.  Primary RN and pharmacy notified.  

## 2022-07-14 NOTE — Patient Instructions (Signed)
[place] at [facility] Discharge Instructions   You were seen and examined today by Dr. Delton Coombes.  He reviewed the results of your lab work and CT scan. All results are normal/stable.   We will proceed with your treatment today.   Return as scheduled.    Thank you for choosing [place] at [facility] to provide your oncology and hematology care.  To afford each patient quality time with our provider, please arrive at least 15 minutes before your scheduled appointment time.   If you have a lab appointment with the [facility] please come in thru the Main Entrance and check in at the main information desk.  You need to re-schedule your appointment should you arrive 10 or more minutes late.  We strive to give you quality time with our providers, and arriving late affects you and other patients whose appointments are after yours.  Also, if you no show three or more times for appointments you may be dismissed from the clinic at the providers discretion.     Again, thank you for choosing [facility].  Our hope is that these requests will decrease the amount of time that you wait before being seen by our physicians.       _____________________________________________________________  Should you have questions after your visit to [facility], please contact our office at [phone] and follow the prompts.  Our office hours are 8:00 a.m. and 4:30 p.m. Monday - Friday.  Please note that voicemails left after 4:00 p.m. may not be returned until the following business day.  We are closed weekends and major holidays.  You do have access to a nurse 24-7, just call the main number to the clinic [phone] and do not press any options, hold on the line and a nurse will answer the phone.    For prescription refill requests, have your pharmacy contact our office and allow 72 hours.    Due to Covid, you will need to wear a  mask upon entering the hospital. If you do not have a mask, a mask will be given to you at the Main Entrance upon arrival. For doctor visits, patients may have 1 support person age 30 or older with them. For treatment visits, patients can not have anyone with them due to social distancing guidelines and our immunocompromised population.

## 2022-07-14 NOTE — Progress Notes (Signed)
FMLA documents filled out for daughter Madelaine Etienne and left for her to pick up.

## 2022-07-14 NOTE — Progress Notes (Signed)
Patient presents today for Opdivo infusion per providers order.  Vital signs and labs reviewed by MD.  Message received from Anastasio Champion RN/Dr. Delton Coombes patient okayfor treatment.  Treatment given today per MD orders.  Stable during infusion without adverse affects.  Vital signs stable.  No complaints at this time.  Discharge from clinic ambulatory in stable condition.  Alert and oriented X 3.  Follow up with [facility] as scheduled.

## 2022-07-14 NOTE — Patient Instructions (Signed)
[place]  Discharge Instructions: Thank you for choosing [facility] to provide your oncology and hematology care.  If you have a lab appointment with the [facility], please come in thru the Main Entrance and check in at the main information desk.  Wear comfortable clothing and clothing appropriate for easy access to any Portacath or PICC line.   We strive to give you quality time with your provider. You may need to reschedule your appointment if you arrive late (15 or more minutes).  Arriving late affects you and other patients whose appointments are after yours.  Also, if you miss three or more appointments without notifying the office, you may be dismissed from the clinic at the provider's discretion.      For prescription refill requests, have your pharmacy contact our office and allow 72 hours for refills to be completed.    Today you received the following chemotherapy and/or immunotherapy agents Opdivo      To help prevent nausea and vomiting after your treatment, we encourage you to take your nausea medication as directed.  BELOW ARE SYMPTOMS THAT SHOULD BE REPORTED IMMEDIATELY: *FEVER GREATER THAN 100.4 F (38 C) OR HIGHER *CHILLS OR SWEATING *NAUSEA AND VOMITING THAT IS NOT CONTROLLED WITH YOUR NAUSEA MEDICATION *UNUSUAL SHORTNESS OF BREATH *UNUSUAL BRUISING OR BLEEDING *URINARY PROBLEMS (pain or burning when urinating, or frequent urination) *BOWEL PROBLEMS (unusual diarrhea, constipation, pain near the anus) TENDERNESS IN MOUTH AND THROAT WITH OR WITHOUT PRESENCE OF ULCERS (sore throat, sores in mouth, or a toothache) UNUSUAL RASH, SWELLING OR PAIN  UNUSUAL VAGINAL DISCHARGE OR ITCHING   Items with * indicate a potential emergency and should be followed up as soon as possible or go to the Emergency Department if any problems should occur.  Please show the CHEMOTHERAPY ALERT CARD or IMMUNOTHERAPY ALERT CARD at check-in to the Emergency  Department and triage nurse.  Should you have questions after your visit or need to cancel or reschedule your appointment, please contact [facility] [phone]  and follow the prompts.  Office hours are 8:00 a.m. to 4:30 p.m. Monday - Friday. Please note that voicemails left after 4:00 p.m. may not be returned until the following business day.  We are closed weekends and major holidays. You have access to a nurse at all times for urgent questions. Please call the main number to the clinic [phone] and follow the prompts.  For any non-urgent questions, you may also contact your provider using MyChart. We now offer e-Visits for anyone 46 and older to request care online for non-urgent symptoms. For details visit [email or web address].   Also download the MyChart app! Go to the app store, search "MyChart", open the app, select [facility] [place], and log in with your MyChart username and password.  Masks are optional in the cancer centers. If you would like for your care team to wear a mask while they are taking care of you, please let them know. You may have one support person who is at least 66 years old accompany you for your appointments.

## 2022-07-18 ENCOUNTER — Ambulatory Visit (HOSPITAL_COMMUNITY)
Admission: RE | Admit: 2022-07-18 | Discharge: 2022-07-18 | Disposition: A | Discharge status: Home / Self Care | Payer: Medicare HMO | Source: Ambulatory Visit | Attending: Internal Medicine | Admitting: Internal Medicine

## 2022-07-18 DIAGNOSIS — Z1231 Encounter for screening mammogram for malignant neoplasm of breast: Secondary | ICD-10-CM | POA: Diagnosis not present

## 2022-07-22 ENCOUNTER — Encounter: Payer: Self-pay | Admitting: Hematology

## 2022-07-22 NOTE — Progress Notes (Signed)
Melissa Sullivan [address], [place] [address]   CLINIC:  Medical Oncology/Hematology  PCP:  [name], MD [address] [place] [address] [phone]   REASON FOR VISIT:  Follow-up for metastatic right lung cancer  PRIOR THERAPY:  1. Bevacizumab, cisplatin and pemetrexed x 4 cycles from 09/02/2014 to 11/04/2014. 2. Bevacizumab and pemetrexed x 3 cycles from 12/24/2014 to 02/04/2015.  NGS Results: not done  CURRENT THERAPY: Nivolumab every 4 weeks  BRIEF ONCOLOGIC HISTORY:  Oncology History  Cancer of upper lobe of right lung ([facility])  07/28/2014 Imaging   CT chest: Large R apical mass consistent with malignancy. This is destroying the R 2nd rib with extension into adjacent soft tissue. R hilar adenopathy with R 5cm adrenal metastatic lesion.   08/01/2014 Initial Biopsy   Lung, needle/core biopsy(ies), right upper lobe - POORLY DIFFERENTIATED ADENOCARCINOMA, SEE COMMENT.   08/08/2014 PET scan   Large hypermetabolic R apical mass with evidence of direct chest wall and mediastinal invasion, right retrocrural lymphadenopathy, extensive retroperitoneal lymphadenopathy, and metastatic lesions to the adrenal glands    09/02/2014 - 11/04/2014 Chemotherapy   Cisplatin/Pemetrexed/Avastin every 21 days x 4 cycles   10/07/2014 - 10/27/2014 Radiation Therapy   Right lung apex for control of brachioplexopathy.   12/24/2014 - 02/25/2015 Chemotherapy   Alimta/Avastin every 21 days.   02/20/2015 Imaging   Increase in size of right adrenal metastasis and subjacent confluent retrocaval lymphadenopathy   02/25/2015 -  Chemotherapy   Nivolumab, zometa   05/04/2015 Imaging   CT CAP- Stable to slight decrease in the posterior right apical lesion. Stable appearance of posterior right upper rib an upper thoracic bony lesions. Slight improvement in right upper lobe tree-in-bud opacity. No new or progressive findings in...   07/28/2015 Imaging   CT CAP- Reduced size of the  right apical pleural parenchymal lesion and reduced size of the right adrenal metastatic lesion. Resolution of prior retrocrural adenopathy.  Right eccentric T1 and T2 sclerosis with sclerosis and tapering of the right second..   11/17/2015 Imaging   CT CAP- Stable soft tissue thickening in the apex of the right hemi thorax. Stable right adrenal metastasis. Nodularity along the trachea and mainstem bronchi, relatively new from 07/28/2015, favoring adherent debris.   11/18/2015 Treatment Plan Change   Zometa HELD for upcoming tooth extraction   11/24/2015 Treatment Plan Change   Zometa on hold at this time in preparation for tooth extraction in March 2017.  Zometa las given on 11/18/2015.   02/03/2016 Imaging   CT CAP- Heterogeneous right apical masslike consolidation and right adrenal metastasis are unchanged   04/06/2016 Treatment Plan Change   Zometa restarted 6 weeks out from tooth extraction (04/06/2016)   05/12/2016 Imaging   CT CAP- NED in the chest, abdomen or pelvis.  Some areas of nodularity associated with the mainstem bronchi in the left upper lobe bronchus, favored to represent adherent inspissated secretions   08/17/2016 Imaging   CT CAP- 1. Stable CTs of the chest and abdomen. No evidence of progressive metastatic disease. 2. Probable treated tumor at the right apex, right adrenal gland and T2 vertebral body, stable. 3. Fluctuating nodularity along the walls of the trachea and mainstem bronchi, likely secretions.   12/12/2016 Imaging   Further decrease in size of treated tumor within the right apex. 2. Stable treated tumor involving the right second rib and T2 vertebra. 3. Stable right adrenal gland treated tumor. 4. Emphysema 5. Aortic atherosclerosis  01/04/2017 Imaging   MRI brain- Normal brain MRI.  No intracranial metastatic disease.   03/15/2017 Imaging   CT CAP- 1. No new or progressive metastatic disease in the chest or abdomen. 2. Stable treated tumor in the  apical right upper lobe. Stable treated right posterior second rib and right T2 vertebral lesions. Stable treated right adrenal metastasis. 3. Aortic atherosclerosis. 4. Moderate emphysema with mild diffuse bronchial wall thickening, suggesting COPD.   06/12/2017 Imaging   CT CAP 1. Similar appearance of treated primary within the right apex. 2. Similar areas of sclerosis within the right second rib, T2, and less so T1 vertebral bodies. These are most consistent with treated metastasis. 3. Similar right adrenal treated metastasis. 4. No evidence of new or progressive disease. 5. Similar right and progressive left areas of bronchial wall thickening and probable mucoid impaction. Correlate with interval infectious symptoms. 6.  Emphysema (ICD10-J43.9). 7. Coronary artery atherosclerosis. Aortic Atherosclerosis (ICD10-I70.0).     10/16/2017 Imaging   CT CAP 1. Stable appearance of the prior Pancoast tumor and related bony findings in the right second rib and right T1 and T2 vertebra compatible with successfully treated tumor. No significant enlargement or new lesions are identified. Similarly the treated right adrenal metastatic lesion is stable in appearance. 2. Upper normal size right hilar lymph node may warrant surveillance. Currently 9 mm in short axis. 3. Other imaging findings of potential clinical significance: Aortic Atherosclerosis (ICD10-I70.0) and Emphysema (ICD10-J43.9). Scattered proximal sigmoid colon diverticula. Mucus plugging medially in the left upper lobe and posteriorly in the right upper lobe.     11/29/2017 - 06/16/2022 Chemotherapy   Patient is on Treatment Plan : LUNG Nivolumab q28d     07/14/2022 -  Chemotherapy   Patient is on Treatment Plan : LUNG Nivolumab (480) q28d       CANCER STAGING:  Cancer Staging  No matching staging information was found for the patient.  INTERVAL HISTORY:  Melissa Sullivan, a 66 y.o. female, seen for follow-up of  her metastatic lung cancer and next cycle of nivolumab and toxicity assessment.  Reports some shortness of breath on exertion.  Denies any fevers or infections.  Denies any skin rashes, dry cough or diarrhea.  Weight has been stable.   REVIEW OF SYSTEMS:  Review of Systems  Respiratory:  Positive for shortness of breath.   Psychiatric/Behavioral:  Positive for sleep disturbance.   All other systems reviewed and are negative.   PAST MEDICAL/SURGICAL HISTORY:  Past Medical History:  Diagnosis Date   Adrenal mass, right ([facility]) 07/28/2014   Anemia    Bone metastases 04/05/2016   Cancer ([facility]) 2015   lung  right   Diabetes mellitus without complication ([facility])    GERD (gastroesophageal reflux disease)    Hyperlipidemia    Hypertension    Hypothyroidism due to medication 01/30/2017   Lung mass 07/28/2014   Reflux    Past Surgical History:  Procedure Laterality Date   AMPUTATION Right 02/22/2017   Procedure: PARTIAL AMPUTATION RIGHT GREAT TOE;  Surgeon: Caprice Beaver, DPM;  Location: [facility];  Service: Podiatry;  Laterality: Right;   APPENDECTOMY     BIOPSY  09/22/2020   Procedure: BIOPSY;  Surgeon: Eloise Harman, DO;  Location: [facility];  Service: Endoscopy;;   COLONOSCOPY WITH PROPOFOL N/A 09/22/2020   non-bleeding internal hemorrhoids, sigmoid and descending colon diverticulosis, localized mild inflammation in sigmoid that was felt due to prep artifact.    ESOPHAGOGASTRODUODENOSCOPY N/A 12/09/2014  YWV:PXTGGY esophageal stricture/mild-to-noderate erosive gastritis. negative H.pylori   FLEXIBLE SIGMOIDOSCOPY  2011   Dr. [name]: hyperplastic polyp   LUNG BIOPSY Right 07/2014   CT guided   MALONEY DILATION N/A 12/09/2014   Procedure: MALONEY DILATION;  Surgeon: [name], MD;  Location: [facility];  Service: Endoscopy;  Laterality: N/A;   PORTACATH PLACEMENT Left 09/01/2014   SAVORY DILATION N/A 12/09/2014   Procedure: SAVORY DILATION;  Surgeon: [name], MD;  Location:  [facility];  Service: Endoscopy;  Laterality: N/A;    SOCIAL HISTORY:  Social History   Socioeconomic History   Marital status: Married    Spouse name: Not on file   Number of children: Not on file   Years of education: Not on file   Highest education level: Not on file  Occupational History   Not on file  Tobacco Use   Smoking status: Light Smoker    Packs/day: 0.30    Years: 20.00    Total pack years: 6.00    Types: Cigarettes   Smokeless tobacco: Never  Vaping Use   Vaping Use: Never used  Substance and Sexual Activity   Alcohol use: No   Drug use: No   Sexual activity: Not on file  Other Topics Concern   Not on file  Social History Narrative   Not on file   Social Determinants of Health   Financial Resource Strain: Low Risk  (12/03/2020)   Overall Financial Resource Strain (CARDIA)    Difficulty of Paying Living Expenses: Not hard at all  Food Insecurity: No Food Insecurity (12/03/2020)   Hunger Vital Sign    Worried About Running Out of Food in the Last Year: Never true    Ran Out of Food in the Last Year: Never true  Transportation Needs: No Transportation Needs (12/03/2020)   PRAPARE - Hydrologist (Medical): No    Lack of Transportation (Non-Medical): No  Physical Activity: Not on file  Stress: No Stress Concern Present (12/03/2020)   [place]    Feeling of Stress : Only a little  Social Connections: Moderately Isolated (12/03/2020)   Social Connection and Isolation Panel [NHANES]    Frequency of Communication with Friends and Family: More than three times a week    Frequency of Social Gatherings with Friends and Family: More than three times a week    Attends Religious Services: Never    Marine scientist or Organizations: No    Attends Archivist Meetings: Never    Marital Status: Married  Human resources officer Violence: Not At Risk (12/03/2020)    Humiliation, Afraid, Rape, and Kick questionnaire    Fear of Current or Ex-Partner: No    Emotionally Abused: No    Physically Abused: No    Sexually Abused: No    FAMILY HISTORY:  Family History  Problem Relation Age of Onset   Cancer Sister    Colon cancer Neg Hx     CURRENT MEDICATIONS:  Current Outpatient Medications  Medication Sig Dispense Refill   albuterol (VENTOLIN HFA) 108 (90 Base) MCG/ACT inhaler Inhale 2 puffs into the lungs every 6 (six) hours as needed for wheezing or shortness of breath.     ENSURE (ENSURE) Take 1 Can by mouth 4 (four) times daily.     Ferrous Sulfate (IRON) 325 (65 Fe) MG TABS Take 325 mg by mouth daily.      fluticasone (  FLONASE) 50 MCG/ACT nasal spray fluticasone propionate 50 mcg/actuation nasal spray,suspension     fluticasone (FLONASE) 50 MCG/ACT nasal spray Place into both nostrils.     fluticasone-salmeterol (ADVAIR DISKUS) 100-50 MCG/ACT AEPB INHALE 1 DOSE BY MOUTH TWICE DAILY     Fluticasone-Salmeterol (ADVAIR DISKUS) 500-50 MCG/DOSE AEPB Inhale 1 puff into the lungs 2 (two) times daily. 60 each 6   levocetirizine (XYZAL) 5 MG tablet Take 5 mg by mouth every evening.      levothyroxine (SYNTHROID) 25 MCG tablet TAKE 1/2 (ONE-HALF) TABLET BY MOUTH ONCE DAILY BEFORE BREAKFAST 45 tablet 3   naloxone (NARCAN) nasal spray 4 mg/0.1 mL      Nivolumab (OPDIVO IV) Inject into the vein every 28 (twenty-eight) days.     omeprazole (PRILOSEC) 40 MG capsule Take 1 capsule (40 mg total) by mouth daily. 30 capsule 11   Oxycodone HCl 10 MG TABS Take 1 tablet (10 mg total) by mouth every 12 (twelve) hours as needed (pain.). 60 tablet 0   promethazine (PHENERGAN) 6.25 MG/5ML syrup every 4 (four) hours as needed.     pseudoephedrine-guaifenesin (MUCINEX D) 60-600 MG 12 hr tablet Take 1 tablet by mouth daily as needed for congestion.     rosuvastatin (CRESTOR) 20 MG tablet Take 1 tablet by mouth daily.     rosuvastatin (CRESTOR) 20 MG tablet Take 20 mg by mouth  daily.     simvastatin (ZOCOR) 40 MG tablet simvastatin 40 mg tablet  TAKE 1 TABLET BY MOUTH ONCE DAILY     dronabinol (MARINOL) 2.5 MG capsule Take 1 capsule (2.5 mg total) by mouth 2 (two) times daily before lunch and supper. 60 capsule 5   lidocaine-prilocaine (EMLA) cream Apply 1 Application topically as needed (port access). 30 g 3   No current facility-administered medications for this visit.   Facility-Administered Medications Ordered in Other Visits  Medication Dose Route Frequency Provider Last Rate Last Admin   sodium chloride flush (NS) 0.9 % injection 10 mL  10 mL Intracatheter PRN Derek Jack, MD   10 mL at 01/03/19 0930    ALLERGIES:  Allergies  Allergen Reactions   Xgeva [Denosumab]     osteonecrosis    PHYSICAL EXAM:  Performance status (ECOG): 0 - Asymptomatic  Vitals:   07/14/22 1048  BP: 111/60  Pulse: 89  Resp: 18  Temp: 98.2 F (36.8 C)  SpO2: 100%   Wt Readings from Last 3 Encounters:  07/14/22 96 lb 14.4 oz (44 kg)  06/16/22 96 lb 9.6 oz (43.8 kg)  05/19/22 97 lb 12.8 oz (44.4 kg)   Physical Exam Vitals reviewed.  Constitutional:      Appearance: Normal appearance.  Cardiovascular:     Pulses: Normal pulses.     Heart sounds: Normal heart sounds.  Pulmonary:     Effort: Pulmonary effort is normal.     Breath sounds: Normal breath sounds.  Abdominal:     Palpations: Abdomen is soft. There is no mass.     Tenderness: There is no abdominal tenderness.  Neurological:     Mental Status: She is alert.  Psychiatric:        Mood and Affect: Mood normal.        Behavior: Behavior normal.     LABORATORY DATA:  I have reviewed the labs as listed.     Latest Ref Rng & Units 07/14/2022    9:45 AM 06/16/2022    9:56 AM 05/19/2022    9:57 AM  CBC  WBC 4.0 - 10.5 K/uL 9.8  9.4  9.3   Hemoglobin 12.0 - 15.0 g/dL 13.8  13.3  13.8   Hematocrit 36.0 - 46.0 % 41.2  40.4  42.4   Platelets 150 - 400 K/uL 262  254  300       Latest Ref Rng  & Units 07/14/2022    9:45 AM 06/16/2022    9:56 AM 05/19/2022    9:57 AM  CMP  Glucose 70 - 99 mg/dL 106  109  110   BUN 8 - 23 mg/dL 12  12  13    Creatinine 0.44 - 1.00 mg/dL 1.23  1.19  1.24   Sodium 135 - 145 mmol/L 138  138  138   Potassium 3.5 - 5.1 mmol/L 3.8  3.5  4.1   Chloride 98 - 111 mmol/L 108  107  107   CO2 22 - 32 mmol/L 25  25  26    Calcium 8.9 - 10.3 mg/dL 9.5  9.4  9.5   Total Protein 6.5 - 8.1 g/dL 7.5  7.2  7.5   Total Bilirubin 0.3 - 1.2 mg/dL 0.8  0.3  0.3   Alkaline Phos 38 - 126 U/L 63  54  71   AST 15 - 41 U/L 18  18  20    ALT 0 - 44 U/L 15  16  18      DIAGNOSTIC IMAGING:  I have independently reviewed the scans and discussed with the patient. MM 3D SCREEN BREAST BILATERAL  Result Date: 07/19/2022 CLINICAL DATA:  Screening. EXAM: DIGITAL SCREENING BILATERAL MAMMOGRAM WITH TOMOSYNTHESIS AND CAD TECHNIQUE: Bilateral screening digital craniocaudal and mediolateral oblique mammograms were obtained. Bilateral screening digital breast tomosynthesis was performed. The images were evaluated with computer-aided detection. COMPARISON:  Previous exam(s). ACR Breast Density Category c: The breast tissue is heterogeneously dense, which may obscure small masses. FINDINGS: There are no findings suspicious for malignancy. IMPRESSION: No mammographic evidence of malignancy. A result letter of this screening mammogram will be mailed directly to the patient. RECOMMENDATION: Screening mammogram in one year. (Code:SM-B-01Y) BI-RADS CATEGORY  1: Negative. Electronically Signed   By: Audie Pinto M.D.   On: 07/19/2022 14:40   CT CHEST ABDOMEN PELVIS W CONTRAST  Result Date: 07/08/2022 CLINICAL DATA:  Metastatic lung cancer, ongoing chemotherapy. Status post radiation therapy. * Tracking Code: BO * EXAM: CT CHEST, ABDOMEN, AND PELVIS WITH CONTRAST TECHNIQUE: Multidetector CT imaging of the chest, abdomen and pelvis was performed following the standard protocol during bolus  administration of intravenous contrast. RADIATION DOSE REDUCTION: This exam was performed according to the departmental dose-optimization program which includes automated exposure control, adjustment of the mA and/or kV according to patient size and/or use of iterative reconstruction technique. CONTRAST:  38mL OMNIPAQUE IOHEXOL 300 MG/ML  SOLN COMPARISON:  01/21/2022. FINDINGS: CT CHEST FINDINGS Cardiovascular: Left IJ Port-A-Cath terminates in the SVC. Atherosclerotic calcification of the aorta. Heart size normal. No pericardial effusion. Mediastinum/Nodes: No pathologically enlarged mediastinal, hilar or axillary lymph nodes. Esophagus is grossly unremarkable. Lungs/Pleura: Bullous paraseptal and centrilobular emphysema. Right apical pleuroparenchymal scarring, unchanged. Scattered mucoid impaction in the lingula. Minimal linear scarring in the right middle lobe and lingula. Lungs are otherwise clear. No pleural fluid. Adherent debris in the airway. Musculoskeletal: Degenerative changes in the spine. T2 compression deformity, unchanged. No worrisome lytic or sclerotic lesions. CT ABDOMEN PELVIS FINDINGS Hepatobiliary: Liver is unremarkable. Stone in the gallbladder. No biliary ductal dilatation. Pancreas: Negative. Spleen: Negative. Adrenals/Urinary Tract: Calcified right adrenal gland. Left  adrenal gland and kidneys are unremarkable. Ureters are decompressed. Bladder is low in volume. Stomach/Bowel: Stomach is decompressed. Small bowel and colon are unremarkable. Appendectomy. Vascular/Lymphatic: Atherosclerotic calcification of the aorta. No pathologically enlarged lymph nodes. Reproductive: Uterus is visualized.  No adnexal mass. Other: No free fluid.  Mesenteries and peritoneum are unremarkable. Musculoskeletal: Degenerative changes in the spine. No worrisome lytic or sclerotic lesions. IMPRESSION: 1. Post radiation scarring in the apex of the right hemithorax. No evidence of recurrent or metastatic disease.  2. Cholelithiasis. 3.  Aortic atherosclerosis (ICD10-I70.0). 4.  Emphysema (ICD10-J43.9). Electronically Signed   By: Lorin Picket M.D.   On: 07/08/2022 12:41     ASSESSMENT:  1. Cancer of upper lobe of right lung [facility]) Metastatic adenocarcinoma of the lung to the bones and right adrenal: -Foundation 1 testing shows 14 genomic alterations, no approved therapies. -Opdivo 480 mg monthly started on February 25, 2015. -CT CAP on February 21, 2020 shows no evidence of progression or metastatic disease.  T2 sclerotic lesion is stable. -CTAP on 08/10/2020 with no evidence of recurrent metastatic disease in the abdomen or pelvis.  Irregular mildly enlarged right adrenal gland unchanged. -CT chest on 09/09/2020 showed partial clearing of the airspace opacities noted medially in the lingula and left lower lobe.  New irregular nodule posteriorly at the right apex likely inflammatory.  Stable sclerotic T2 vertebral body lesion. -Reviewed CT CAP from 02/19/2021 which showed stable to slight decrease in size of mediastinal lymph nodes.  Stable posttreatment changes in the left lung apex and slight decrease in size of the absent irregular nodule.  Stable sclerotic lesion in T2 vertebral body.  No new progressive findings.    PLAN:  1.  Metastatic lung adenocarcinoma: - CT CAP (07/07/2022): Postradiation scarring in the apex of the right hemithorax with no evidence of recurrence or metastatic disease. - She is tolerating nivolumab without any immunotherapy related side effects. - Reviewed labs today which showed normal LFTs and electrolytes.  CBC was normal. - Proceed with nivolumab today and monthly.  RTC 3 months for follow-up.   2.  High risk drug monitoring: - TSH is 1.96.  Continue Synthroid 25 mcg daily.   3.  Health maintenance: - We have scheduled her mammogram on 07/18/2022.   4.  CKD: -Creatinine today is 1.23.  Baseline creatinine between 1.2-1.3.   5.  Bone metastasis: - Bisphosphonates on hold due  to ONJ in 2019.  6.  Chronic pains: - Continue oxycodone as needed.  7.  Weight loss: - Continue Marinol 2.5 mg twice daily.  Weight is stable.   Orders placed this encounter:  Orders Placed This Encounter  Procedures   CBC with Differential   Comprehensive metabolic panel   T4   TSH   CBC with Differential   Comprehensive metabolic panel   CBC with Differential   Comprehensive metabolic panel   T4   TSH   CBC with Differential   Comprehensive metabolic panel   CBC with Differential   Comprehensive metabolic panel   CBC with Differential   Comprehensive metabolic panel   T4   TSH   CBC with Differential   Comprehensive metabolic panel   T4   TSH   CBC with Differential   Comprehensive metabolic panel   T4   TSH   CBC with Differential   Comprehensive metabolic panel   T4   TSH   CBC with Differential   Comprehensive metabolic panel   T4   TSH  Derek Jack, MD [place] [phone]

## 2022-07-28 DIAGNOSIS — B351 Tinea unguium: Secondary | ICD-10-CM | POA: Diagnosis not present

## 2022-07-28 DIAGNOSIS — G629 Polyneuropathy, unspecified: Secondary | ICD-10-CM | POA: Diagnosis not present

## 2022-08-11 ENCOUNTER — Inpatient Hospital Stay: Payer: Medicare HMO | Attending: Hematology

## 2022-08-11 ENCOUNTER — Other Ambulatory Visit: Payer: Medicare HMO

## 2022-08-11 ENCOUNTER — Ambulatory Visit: Payer: Medicare HMO | Admitting: Physician Assistant

## 2022-08-11 ENCOUNTER — Inpatient Hospital Stay: Payer: Medicare HMO

## 2022-08-11 VITALS — BP 101/64 | HR 84 | Temp 98.6°F | Resp 16 | Wt 96.8 lb

## 2022-08-11 DIAGNOSIS — C3411 Malignant neoplasm of upper lobe, right bronchus or lung: Secondary | ICD-10-CM

## 2022-08-11 DIAGNOSIS — C7951 Secondary malignant neoplasm of bone: Secondary | ICD-10-CM | POA: Diagnosis not present

## 2022-08-11 DIAGNOSIS — Z79899 Other long term (current) drug therapy: Secondary | ICD-10-CM | POA: Diagnosis not present

## 2022-08-11 DIAGNOSIS — N189 Chronic kidney disease, unspecified: Secondary | ICD-10-CM | POA: Insufficient documentation

## 2022-08-11 DIAGNOSIS — C7971 Secondary malignant neoplasm of right adrenal gland: Secondary | ICD-10-CM | POA: Insufficient documentation

## 2022-08-11 DIAGNOSIS — Z79890 Hormone replacement therapy (postmenopausal): Secondary | ICD-10-CM | POA: Diagnosis not present

## 2022-08-11 DIAGNOSIS — Z79891 Long term (current) use of opiate analgesic: Secondary | ICD-10-CM | POA: Diagnosis not present

## 2022-08-11 DIAGNOSIS — R634 Abnormal weight loss: Secondary | ICD-10-CM | POA: Insufficient documentation

## 2022-08-11 DIAGNOSIS — F17210 Nicotine dependence, cigarettes, uncomplicated: Secondary | ICD-10-CM | POA: Insufficient documentation

## 2022-08-11 DIAGNOSIS — J439 Emphysema, unspecified: Secondary | ICD-10-CM | POA: Diagnosis not present

## 2022-08-11 DIAGNOSIS — K8020 Calculus of gallbladder without cholecystitis without obstruction: Secondary | ICD-10-CM | POA: Diagnosis not present

## 2022-08-11 DIAGNOSIS — I129 Hypertensive chronic kidney disease with stage 1 through stage 4 chronic kidney disease, or unspecified chronic kidney disease: Secondary | ICD-10-CM | POA: Diagnosis not present

## 2022-08-11 DIAGNOSIS — G8929 Other chronic pain: Secondary | ICD-10-CM | POA: Diagnosis not present

## 2022-08-11 DIAGNOSIS — Z5112 Encounter for antineoplastic immunotherapy: Secondary | ICD-10-CM | POA: Insufficient documentation

## 2022-08-11 LAB — TSH: TSH: 1.813 u[IU]/mL (ref 0.350–4.500)

## 2022-08-11 LAB — CBC WITH DIFFERENTIAL/PLATELET
Abs Immature Granulocytes: 0.05 10*3/uL (ref 0.00–0.07)
Basophils Absolute: 0 10*3/uL (ref 0.0–0.1)
Basophils Relative: 0 %
Eosinophils Absolute: 0.1 10*3/uL (ref 0.0–0.5)
Eosinophils Relative: 1 %
HCT: 42.9 % (ref 36.0–46.0)
Hemoglobin: 14.2 g/dL (ref 12.0–15.0)
Immature Granulocytes: 1 %
Lymphocytes Relative: 20 %
Lymphs Abs: 1.9 10*3/uL (ref 0.7–4.0)
MCH: 31.3 pg (ref 26.0–34.0)
MCHC: 33.1 g/dL (ref 30.0–36.0)
MCV: 94.5 fL (ref 80.0–100.0)
Monocytes Absolute: 0.7 10*3/uL (ref 0.1–1.0)
Monocytes Relative: 7 %
Neutro Abs: 7 10*3/uL (ref 1.7–7.7)
Neutrophils Relative %: 71 %
Platelets: 255 10*3/uL (ref 150–400)
RBC: 4.54 MIL/uL (ref 3.87–5.11)
RDW: 15.6 % — ABNORMAL HIGH (ref 11.5–15.5)
WBC: 9.8 10*3/uL (ref 4.0–10.5)
nRBC: 0 % (ref 0.0–0.2)

## 2022-08-11 LAB — COMPREHENSIVE METABOLIC PANEL
ALT: 17 U/L (ref 0–44)
AST: 19 U/L (ref 15–41)
Albumin: 4.3 g/dL (ref 3.5–5.0)
Alkaline Phosphatase: 58 U/L (ref 38–126)
Anion gap: 6 (ref 5–15)
BUN: 15 mg/dL (ref 8–23)
CO2: 26 mmol/L (ref 22–32)
Calcium: 9.6 mg/dL (ref 8.9–10.3)
Chloride: 106 mmol/L (ref 98–111)
Creatinine, Ser: 1.25 mg/dL — ABNORMAL HIGH (ref 0.44–1.00)
GFR, Estimated: 48 mL/min — ABNORMAL LOW (ref >60)
Glucose, Bld: 98 mg/dL (ref 70–99)
Potassium: 4.1 mmol/L (ref 3.5–5.1)
Sodium: 138 mmol/L (ref 135–145)
Total Bilirubin: 0.6 mg/dL (ref 0.3–1.2)
Total Protein: 7.8 g/dL (ref 6.5–8.1)

## 2022-08-11 MED ORDER — SODIUM CHLORIDE 0.9% FLUSH
10.0000 mL | INTRAVENOUS | Status: DC | PRN
  Administered 2022-08-11: 10 mL

## 2022-08-11 MED ORDER — SODIUM CHLORIDE 0.9 % IV SOLN
480.0000 mg | Freq: Once | INTRAVENOUS | Status: AC
  Administered 2022-08-11: 480 mg via INTRAVENOUS
  Filled 2022-08-11: qty 48

## 2022-08-11 MED ORDER — SODIUM CHLORIDE 0.9 % IV SOLN: Freq: Once | INTRAVENOUS | Status: AC

## 2022-08-11 MED ORDER — HEPARIN SOD (PORK) LOCK FLUSH 100 UNIT/ML IV SOLN
500.0000 [IU] | Freq: Once | INTRAVENOUS | Status: AC | PRN
  Administered 2022-08-11: 500 [IU]

## 2022-08-11 NOTE — Patient Instructions (Signed)
[facility]  Discharge Instructions: Thank you for choosing [facility] to provide your oncology and hematology care.  If you have a lab appointment with the [facility], please come in thru the Main Entrance and check in at the main information desk.  Wear comfortable clothing and clothing appropriate for easy access to any Portacath or PICC line.   We strive to give you quality time with your provider. You may need to reschedule your appointment if you arrive late (15 or more minutes).  Arriving late affects you and other patients whose appointments are after yours.  Also, if you miss three or more appointments without notifying the office, you may be dismissed from the clinic at the provider's discretion.      For prescription refill requests, have your pharmacy contact our office and allow 72 hours for refills to be completed.    Today you received the following chemotherapy and/or immunotherapy agents opdivo   To help prevent nausea and vomiting after your treatment, we encourage you to take your nausea medication as directed.  BELOW ARE SYMPTOMS THAT SHOULD BE REPORTED IMMEDIATELY: *FEVER GREATER THAN 100.4 F (38 C) OR HIGHER *CHILLS OR SWEATING *NAUSEA AND VOMITING THAT IS NOT CONTROLLED WITH YOUR NAUSEA MEDICATION *UNUSUAL SHORTNESS OF BREATH *UNUSUAL BRUISING OR BLEEDING *URINARY PROBLEMS (pain or burning when urinating, or frequent urination) *BOWEL PROBLEMS (unusual diarrhea, constipation, pain near the anus) TENDERNESS IN MOUTH AND THROAT WITH OR WITHOUT PRESENCE OF ULCERS (sore throat, sores in mouth, or a toothache) UNUSUAL RASH, SWELLING OR PAIN  UNUSUAL VAGINAL DISCHARGE OR ITCHING   Items with * indicate a potential emergency and should be followed up as soon as possible or go to the Emergency Department if any problems should occur.  Please show the CHEMOTHERAPY ALERT CARD or IMMUNOTHERAPY ALERT CARD at check-in to the Emergency  Department and triage nurse.  Should you have questions after your visit or need to cancel or reschedule your appointment, please contact [facility] [phone]  and follow the prompts.  Office hours are 8:00 a.m. to 4:30 p.m. Monday - Friday. Please note that voicemails left after 4:00 p.m. may not be returned until the following business day.  We are closed weekends and major holidays. You have access to a nurse at all times for urgent questions. Please call the main number to the clinic [phone] and follow the prompts.  For any non-urgent questions, you may also contact your provider using MyChart. We now offer e-Visits for anyone 11 and older to request care online for non-urgent symptoms. For details visit [email or web address].   Also download the MyChart app! Go to the app store, search "MyChart", open the app, select [facility], and log in with your MyChart username and password.  Masks are optional in the cancer centers. If you would like for your care team to wear a mask while they are taking care of you, please let them know. You may have one support person who is at least 66 years old accompany you for your appointments.

## 2022-08-11 NOTE — Progress Notes (Signed)
Labs reviewed, ok to treat per parameters.  Treatment given per orders. Patient tolerated it well without problems. Vitals stable and discharged home from clinic ambulatory. Follow up as scheduled.

## 2022-08-12 LAB — T4: T4, Total: 6.6 ug/dL (ref 4.5–12.0)

## 2022-09-08 ENCOUNTER — Inpatient Hospital Stay: Payer: Medicare HMO | Attending: Hematology

## 2022-09-08 ENCOUNTER — Inpatient Hospital Stay: Payer: Medicare HMO

## 2022-09-08 VITALS — BP 121/71 | HR 68 | Temp 96.9°F | Resp 16 | Wt 97.2 lb

## 2022-09-08 DIAGNOSIS — C7951 Secondary malignant neoplasm of bone: Secondary | ICD-10-CM | POA: Diagnosis not present

## 2022-09-08 DIAGNOSIS — K8020 Calculus of gallbladder without cholecystitis without obstruction: Secondary | ICD-10-CM | POA: Diagnosis not present

## 2022-09-08 DIAGNOSIS — F17210 Nicotine dependence, cigarettes, uncomplicated: Secondary | ICD-10-CM | POA: Insufficient documentation

## 2022-09-08 DIAGNOSIS — Z79891 Long term (current) use of opiate analgesic: Secondary | ICD-10-CM | POA: Diagnosis not present

## 2022-09-08 DIAGNOSIS — G8929 Other chronic pain: Secondary | ICD-10-CM | POA: Insufficient documentation

## 2022-09-08 DIAGNOSIS — N189 Chronic kidney disease, unspecified: Secondary | ICD-10-CM | POA: Insufficient documentation

## 2022-09-08 DIAGNOSIS — C3411 Malignant neoplasm of upper lobe, right bronchus or lung: Secondary | ICD-10-CM | POA: Diagnosis present

## 2022-09-08 DIAGNOSIS — Z79899 Other long term (current) drug therapy: Secondary | ICD-10-CM | POA: Insufficient documentation

## 2022-09-08 DIAGNOSIS — I129 Hypertensive chronic kidney disease with stage 1 through stage 4 chronic kidney disease, or unspecified chronic kidney disease: Secondary | ICD-10-CM | POA: Diagnosis not present

## 2022-09-08 DIAGNOSIS — C7971 Secondary malignant neoplasm of right adrenal gland: Secondary | ICD-10-CM | POA: Insufficient documentation

## 2022-09-08 DIAGNOSIS — Z79890 Hormone replacement therapy (postmenopausal): Secondary | ICD-10-CM | POA: Diagnosis not present

## 2022-09-08 DIAGNOSIS — J439 Emphysema, unspecified: Secondary | ICD-10-CM | POA: Insufficient documentation

## 2022-09-08 DIAGNOSIS — Z5112 Encounter for antineoplastic immunotherapy: Secondary | ICD-10-CM | POA: Diagnosis present

## 2022-09-08 DIAGNOSIS — R634 Abnormal weight loss: Secondary | ICD-10-CM | POA: Insufficient documentation

## 2022-09-08 LAB — COMPREHENSIVE METABOLIC PANEL
ALT: 15 U/L (ref 0–44)
AST: 21 U/L (ref 15–41)
Albumin: 4.3 g/dL (ref 3.5–5.0)
Alkaline Phosphatase: 67 U/L (ref 38–126)
Anion gap: 8 (ref 5–15)
BUN: 12 mg/dL (ref 8–23)
CO2: 27 mmol/L (ref 22–32)
Calcium: 9.9 mg/dL (ref 8.9–10.3)
Chloride: 104 mmol/L (ref 98–111)
Creatinine, Ser: 1.18 mg/dL — ABNORMAL HIGH (ref 0.44–1.00)
GFR, Estimated: 51 mL/min — ABNORMAL LOW (ref >60)
Glucose, Bld: 88 mg/dL (ref 70–99)
Potassium: 4 mmol/L (ref 3.5–5.1)
Sodium: 139 mmol/L (ref 135–145)
Total Bilirubin: 0.5 mg/dL (ref 0.3–1.2)
Total Protein: 8.1 g/dL (ref 6.5–8.1)

## 2022-09-08 LAB — TSH: TSH: 1.492 u[IU]/mL (ref 0.350–4.500)

## 2022-09-08 LAB — CBC WITH DIFFERENTIAL/PLATELET
Abs Immature Granulocytes: 0.02 10*3/uL (ref 0.00–0.07)
Basophils Absolute: 0 10*3/uL (ref 0.0–0.1)
Basophils Relative: 0 %
Eosinophils Absolute: 0.2 10*3/uL (ref 0.0–0.5)
Eosinophils Relative: 2 %
HCT: 44.4 % (ref 36.0–46.0)
Hemoglobin: 14.5 g/dL (ref 12.0–15.0)
Immature Granulocytes: 0 %
Lymphocytes Relative: 25 %
Lymphs Abs: 2.3 10*3/uL (ref 0.7–4.0)
MCH: 31 pg (ref 26.0–34.0)
MCHC: 32.7 g/dL (ref 30.0–36.0)
MCV: 95.1 fL (ref 80.0–100.0)
Monocytes Absolute: 0.6 10*3/uL (ref 0.1–1.0)
Monocytes Relative: 6 %
Neutro Abs: 6.3 10*3/uL (ref 1.7–7.7)
Neutrophils Relative %: 67 %
Platelets: 302 10*3/uL (ref 150–400)
RBC: 4.67 MIL/uL (ref 3.87–5.11)
RDW: 15.6 % — ABNORMAL HIGH (ref 11.5–15.5)
WBC: 9.5 10*3/uL (ref 4.0–10.5)
nRBC: 0 % (ref 0.0–0.2)

## 2022-09-08 MED ORDER — SODIUM CHLORIDE 0.9% FLUSH
10.0000 mL | INTRAVENOUS | Status: DC | PRN
  Administered 2022-09-08: 10 mL

## 2022-09-08 MED ORDER — HEPARIN SOD (PORK) LOCK FLUSH 100 UNIT/ML IV SOLN
500.0000 [IU] | Freq: Once | INTRAVENOUS | Status: AC | PRN
  Administered 2022-09-08: 500 [IU]

## 2022-09-08 MED ORDER — SODIUM CHLORIDE 0.9 % IV SOLN
480.0000 mg | Freq: Once | INTRAVENOUS | Status: AC
  Administered 2022-09-08: 480 mg via INTRAVENOUS
  Filled 2022-09-08: qty 48

## 2022-09-08 MED ORDER — SODIUM CHLORIDE 0.9 % IV SOLN: Freq: Once | INTRAVENOUS | Status: AC

## 2022-09-08 NOTE — Patient Instructions (Signed)
[facility]  Discharge Instructions: Thank you for choosing [facility] to provide your oncology and hematology care.  If you have a lab appointment with the [facility], please come in thru the Main Entrance and check in at the main information desk.  Wear comfortable clothing and clothing appropriate for easy access to any Portacath or PICC line.   We strive to give you quality time with your provider. You may need to reschedule your appointment if you arrive late (15 or more minutes).  Arriving late affects you and other patients whose appointments are after yours.  Also, if you miss three or more appointments without notifying the office, you may be dismissed from the clinic at the provider's discretion.      For prescription refill requests, have your pharmacy contact our office and allow 72 hours for refills to be completed.    Today you received the following chemotherapy and/or immunotherapy agents Opdivo, return as scheduled.   To help prevent nausea and vomiting after your treatment, we encourage you to take your nausea medication as directed.  BELOW ARE SYMPTOMS THAT SHOULD BE REPORTED IMMEDIATELY: *FEVER GREATER THAN 100.4 F (38 C) OR HIGHER *CHILLS OR SWEATING *NAUSEA AND VOMITING THAT IS NOT CONTROLLED WITH YOUR NAUSEA MEDICATION *UNUSUAL SHORTNESS OF BREATH *UNUSUAL BRUISING OR BLEEDING *URINARY PROBLEMS (pain or burning when urinating, or frequent urination) *BOWEL PROBLEMS (unusual diarrhea, constipation, pain near the anus) TENDERNESS IN MOUTH AND THROAT WITH OR WITHOUT PRESENCE OF ULCERS (sore throat, sores in mouth, or a toothache) UNUSUAL RASH, SWELLING OR PAIN  UNUSUAL VAGINAL DISCHARGE OR ITCHING   Items with * indicate a potential emergency and should be followed up as soon as possible or go to the Emergency Department if any problems should occur.  Please show the CHEMOTHERAPY ALERT CARD or IMMUNOTHERAPY ALERT CARD at  check-in to the Emergency Department and triage nurse.  Should you have questions after your visit or need to cancel or reschedule your appointment, please contact [facility] [phone]  and follow the prompts.  Office hours are 8:00 a.m. to 4:30 p.m. Monday - Friday. Please note that voicemails left after 4:00 p.m. may not be returned until the following business day.  We are closed weekends and major holidays. You have access to a nurse at all times for urgent questions. Please call the main number to the clinic [phone] and follow the prompts.  For any non-urgent questions, you may also contact your provider using MyChart. We now offer e-Visits for anyone 60 and older to request care online for non-urgent symptoms. For details visit [email or web address].   Also download the MyChart app! Go to the app store, search "MyChart", open the app, select [facility], and log in with your MyChart username and password.  Masks are optional in the cancer centers. If you would like for your care team to wear a mask while they are taking care of you, please let them know. You may have one support person who is at least 66 years old accompany you for your appointments.

## 2022-09-08 NOTE — Progress Notes (Signed)
Patient tolerated chemotherapy with no complaints voiced. Side effects with management reviewed understanding verbalized. Port site clean and dry with no bruising or swelling noted at site. No blood return noted before and after administration of chemotherapy. Band aid applied. Patient left in satisfactory condition with VSS and no s/s of distress noted.

## 2022-09-09 LAB — T4: T4, Total: 7.9 ug/dL (ref 4.5–12.0)

## 2022-10-06 ENCOUNTER — Inpatient Hospital Stay: Payer: Medicare HMO

## 2022-10-06 ENCOUNTER — Inpatient Hospital Stay: Payer: Medicare HMO | Attending: Hematology | Admitting: Hematology

## 2022-10-06 VITALS — BP 113/68 | HR 75 | Temp 97.8°F | Resp 16

## 2022-10-06 DIAGNOSIS — Z5112 Encounter for antineoplastic immunotherapy: Secondary | ICD-10-CM | POA: Insufficient documentation

## 2022-10-06 DIAGNOSIS — G8929 Other chronic pain: Secondary | ICD-10-CM | POA: Insufficient documentation

## 2022-10-06 DIAGNOSIS — C7971 Secondary malignant neoplasm of right adrenal gland: Secondary | ICD-10-CM | POA: Insufficient documentation

## 2022-10-06 DIAGNOSIS — Z79899 Other long term (current) drug therapy: Secondary | ICD-10-CM | POA: Insufficient documentation

## 2022-10-06 DIAGNOSIS — C3411 Malignant neoplasm of upper lobe, right bronchus or lung: Secondary | ICD-10-CM | POA: Insufficient documentation

## 2022-10-06 DIAGNOSIS — Z79890 Hormone replacement therapy (postmenopausal): Secondary | ICD-10-CM | POA: Diagnosis not present

## 2022-10-06 DIAGNOSIS — Z79891 Long term (current) use of opiate analgesic: Secondary | ICD-10-CM | POA: Insufficient documentation

## 2022-10-06 DIAGNOSIS — K8020 Calculus of gallbladder without cholecystitis without obstruction: Secondary | ICD-10-CM | POA: Diagnosis not present

## 2022-10-06 DIAGNOSIS — N189 Chronic kidney disease, unspecified: Secondary | ICD-10-CM | POA: Insufficient documentation

## 2022-10-06 DIAGNOSIS — Z23 Encounter for immunization: Secondary | ICD-10-CM | POA: Diagnosis not present

## 2022-10-06 DIAGNOSIS — F17210 Nicotine dependence, cigarettes, uncomplicated: Secondary | ICD-10-CM | POA: Insufficient documentation

## 2022-10-06 DIAGNOSIS — R634 Abnormal weight loss: Secondary | ICD-10-CM | POA: Insufficient documentation

## 2022-10-06 DIAGNOSIS — C7951 Secondary malignant neoplasm of bone: Secondary | ICD-10-CM | POA: Diagnosis not present

## 2022-10-06 DIAGNOSIS — J439 Emphysema, unspecified: Secondary | ICD-10-CM | POA: Insufficient documentation

## 2022-10-06 DIAGNOSIS — I129 Hypertensive chronic kidney disease with stage 1 through stage 4 chronic kidney disease, or unspecified chronic kidney disease: Secondary | ICD-10-CM | POA: Diagnosis not present

## 2022-10-06 DIAGNOSIS — G629 Polyneuropathy, unspecified: Secondary | ICD-10-CM | POA: Diagnosis not present

## 2022-10-06 DIAGNOSIS — B351 Tinea unguium: Secondary | ICD-10-CM | POA: Diagnosis not present

## 2022-10-06 LAB — COMPREHENSIVE METABOLIC PANEL
ALT: 16 U/L (ref 0–44)
AST: 20 U/L (ref 15–41)
Albumin: 4 g/dL (ref 3.5–5.0)
Alkaline Phosphatase: 64 U/L (ref 38–126)
Anion gap: 5 (ref 5–15)
BUN: 12 mg/dL (ref 8–23)
CO2: 26 mmol/L (ref 22–32)
Calcium: 9.1 mg/dL (ref 8.9–10.3)
Chloride: 106 mmol/L (ref 98–111)
Creatinine, Ser: 1.19 mg/dL — ABNORMAL HIGH (ref 0.44–1.00)
GFR, Estimated: 50 mL/min — ABNORMAL LOW (ref >60)
Glucose, Bld: 107 mg/dL — ABNORMAL HIGH (ref 70–99)
Potassium: 3.9 mmol/L (ref 3.5–5.1)
Sodium: 137 mmol/L (ref 135–145)
Total Bilirubin: 0.3 mg/dL (ref 0.3–1.2)
Total Protein: 7.4 g/dL (ref 6.5–8.1)

## 2022-10-06 LAB — CBC WITH DIFFERENTIAL/PLATELET
Abs Immature Granulocytes: 0.05 10*3/uL (ref 0.00–0.07)
Basophils Absolute: 0 10*3/uL (ref 0.0–0.1)
Basophils Relative: 0 %
Eosinophils Absolute: 0.1 10*3/uL (ref 0.0–0.5)
Eosinophils Relative: 1 %
HCT: 43 % (ref 36.0–46.0)
Hemoglobin: 14.2 g/dL (ref 12.0–15.0)
Immature Granulocytes: 1 %
Lymphocytes Relative: 23 %
Lymphs Abs: 2.1 10*3/uL (ref 0.7–4.0)
MCH: 31.1 pg (ref 26.0–34.0)
MCHC: 33 g/dL (ref 30.0–36.0)
MCV: 94.3 fL (ref 80.0–100.0)
Monocytes Absolute: 0.5 10*3/uL (ref 0.1–1.0)
Monocytes Relative: 5 %
Neutro Abs: 6.5 10*3/uL (ref 1.7–7.7)
Neutrophils Relative %: 70 %
Platelets: 259 10*3/uL (ref 150–400)
RBC: 4.56 MIL/uL (ref 3.87–5.11)
RDW: 15.2 % (ref 11.5–15.5)
WBC: 9.2 10*3/uL (ref 4.0–10.5)
nRBC: 0 % (ref 0.0–0.2)

## 2022-10-06 LAB — TSH: TSH: 1.835 u[IU]/mL (ref 0.350–4.500)

## 2022-10-06 MED ORDER — INFLUENZA VAC A&B SA ADJ QUAD 0.5 ML IM PRSY
0.5000 mL | PREFILLED_SYRINGE | Freq: Once | INTRAMUSCULAR | Status: AC
  Administered 2022-10-06: 0.5 mL via INTRAMUSCULAR
  Filled 2022-10-06: qty 0.5

## 2022-10-06 MED ORDER — HEPARIN SOD (PORK) LOCK FLUSH 100 UNIT/ML IV SOLN
500.0000 [IU] | Freq: Once | INTRAVENOUS | Status: AC | PRN
  Administered 2022-10-06: 500 [IU]

## 2022-10-06 MED ORDER — SODIUM CHLORIDE 0.9 % IV SOLN: Freq: Once | INTRAVENOUS | Status: AC

## 2022-10-06 MED ORDER — SODIUM CHLORIDE 0.9 % IV SOLN
480.0000 mg | Freq: Once | INTRAVENOUS | Status: AC
  Administered 2022-10-06: 480 mg via INTRAVENOUS
  Filled 2022-10-06: qty 48

## 2022-10-06 MED ORDER — SODIUM CHLORIDE 0.9% FLUSH
10.0000 mL | INTRAVENOUS | Status: DC | PRN
  Administered 2022-10-06: 10 mL

## 2022-10-06 NOTE — Progress Notes (Signed)
Patient has been examined by Dr. Katragadda, and vital signs and labs have been reviewed. ANC, Creatinine, LFTs, hemoglobin, and platelets are within treatment parameters per M.D. - pt may proceed with treatment.  Primary RN and pharmacy notified.  

## 2022-10-06 NOTE — Patient Instructions (Signed)
[place] at [facility] Discharge Instructions   You were seen and examined today by Dr. Delton Coombes.  He reviewed the results of your labs which are normal/stable.   We will proceed with your treatment today and every 4 weeks.  We will repeat a CT scan prior to your next doctor visit in 3 months.   Return as scheduled.    Thank you for choosing [place] at [facility] to provide your oncology and hematology care.  To afford each patient quality time with our provider, please arrive at least 15 minutes before your scheduled appointment time.   If you have a lab appointment with the [facility] please come in thru the Main Entrance and check in at the main information desk.  You need to re-schedule your appointment should you arrive 10 or more minutes late.  We strive to give you quality time with our providers, and arriving late affects you and other patients whose appointments are after yours.  Also, if you no show three or more times for appointments you may be dismissed from the clinic at the providers discretion.     Again, thank you for choosing [facility].  Our hope is that these requests will decrease the amount of time that you wait before being seen by our physicians.       _____________________________________________________________  Should you have questions after your visit to [facility], please contact our office at [phone] and follow the prompts.  Our office hours are 8:00 a.m. and 4:30 p.m. Monday - Friday.  Please note that voicemails left after 4:00 p.m. may not be returned until the following business day.  We are closed weekends and major holidays.  You do have access to a nurse 24-7, just call the main number to the clinic [phone] and do not press any options, hold on the line and a nurse will answer the phone.    For prescription refill requests, have your pharmacy contact our  office and allow 72 hours.    Due to Covid, you will need to wear a mask upon entering the hospital. If you do not have a mask, a mask will be given to you at the Main Entrance upon arrival. For doctor visits, patients may have 1 support person age 20 or older with them. For treatment visits, patients can not have anyone with them due to social distancing guidelines and our immunocompromised population.

## 2022-10-06 NOTE — Progress Notes (Signed)
Labs reviewed with MD today, ok to treat per MD.   Treatment given per orders. Patient tolerated it well without problems. Vitals stable and discharged home from clinic ambulatory. Follow up as scheduled.

## 2022-10-06 NOTE — Patient Instructions (Signed)
[facility]  Discharge Instructions: Thank you for choosing [facility] to provide your oncology and hematology care.  If you have a lab appointment with the [facility], please come in thru the Main Entrance and check in at the main information desk.  Wear comfortable clothing and clothing appropriate for easy access to any Portacath or PICC line.   We strive to give you quality time with your provider. You may need to reschedule your appointment if you arrive late (15 or more minutes).  Arriving late affects you and other patients whose appointments are after yours.  Also, if you miss three or more appointments without notifying the office, you may be dismissed from the clinic at the provider's discretion.      For prescription refill requests, have your pharmacy contact our office and allow 72 hours for refills to be completed.    Today you received the following chemotherapy and/or immunotherapy agents, opdvio   To help prevent nausea and vomiting after your treatment, we encourage you to take your nausea medication as directed.  BELOW ARE SYMPTOMS THAT SHOULD BE REPORTED IMMEDIATELY: *FEVER GREATER THAN 100.4 F (38 C) OR HIGHER *CHILLS OR SWEATING *NAUSEA AND VOMITING THAT IS NOT CONTROLLED WITH YOUR NAUSEA MEDICATION *UNUSUAL SHORTNESS OF BREATH *UNUSUAL BRUISING OR BLEEDING *URINARY PROBLEMS (pain or burning when urinating, or frequent urination) *BOWEL PROBLEMS (unusual diarrhea, constipation, pain near the anus) TENDERNESS IN MOUTH AND THROAT WITH OR WITHOUT PRESENCE OF ULCERS (sore throat, sores in mouth, or a toothache) UNUSUAL RASH, SWELLING OR PAIN  UNUSUAL VAGINAL DISCHARGE OR ITCHING   Items with * indicate a potential emergency and should be followed up as soon as possible or go to the Emergency Department if any problems should occur.  Please show the CHEMOTHERAPY ALERT CARD or IMMUNOTHERAPY ALERT CARD at check-in to the Emergency  Department and triage nurse.  Should you have questions after your visit or need to cancel or reschedule your appointment, please contact [facility] [phone]  and follow the prompts.  Office hours are 8:00 a.m. to 4:30 p.m. Monday - Friday. Please note that voicemails left after 4:00 p.m. may not be returned until the following business day.  We are closed weekends and major holidays. You have access to a nurse at all times for urgent questions. Please call the main number to the clinic [phone] and follow the prompts.  For any non-urgent questions, you may also contact your provider using MyChart. We now offer e-Visits for anyone 11 and older to request care online for non-urgent symptoms. For details visit [email or web address].   Also download the MyChart app! Go to the app store, search "MyChart", open the app, select [facility], and log in with your MyChart username and password.  Masks are optional in the cancer centers. If you would like for your care team to wear a mask while they are taking care of you, please let them know. You may have one support person who is at least 66 years old accompany you for your appointments.

## 2022-10-06 NOTE — Progress Notes (Signed)
[facility] [address] [place] [address]   CLINIC:  Medical Oncology/Hematology  PCP:  [name], MD [address] [place] [address] [phone]   REASON FOR VISIT:  Follow-up for metastatic right lung cancer  PRIOR THERAPY:  1. Bevacizumab, cisplatin and pemetrexed x 4 cycles from 09/02/2014 to 11/04/2014. 2. Bevacizumab and pemetrexed x 3 cycles from 12/24/2014 to 02/04/2015.  NGS Results: not done  CURRENT THERAPY: Nivolumab every 4 weeks  BRIEF ONCOLOGIC HISTORY:  Oncology History  Cancer of upper lobe of right lung ([place])  07/28/2014 Imaging   CT chest: Large R apical mass consistent with malignancy. This is destroying the R 2nd rib with extension into adjacent soft tissue. R hilar adenopathy with R 5cm adrenal metastatic lesion.   08/01/2014 Initial Biopsy   Lung, needle/core biopsy(ies), right upper lobe - POORLY DIFFERENTIATED ADENOCARCINOMA, SEE COMMENT.   08/08/2014 PET scan   Large hypermetabolic R apical mass with evidence of direct chest wall and mediastinal invasion, right retrocrural lymphadenopathy, extensive retroperitoneal lymphadenopathy, and metastatic lesions to the adrenal glands    09/02/2014 - 11/04/2014 Chemotherapy   Cisplatin/Pemetrexed/Avastin every 21 days x 4 cycles   10/07/2014 - 10/27/2014 Radiation Therapy   Right lung apex for control of brachioplexopathy.   12/24/2014 - 02/25/2015 Chemotherapy   Alimta/Avastin every 21 days.   02/20/2015 Imaging   Increase in size of right adrenal metastasis and subjacent confluent retrocaval lymphadenopathy   02/25/2015 -  Chemotherapy   Nivolumab, zometa   05/04/2015 Imaging   CT CAP- Stable to slight decrease in the posterior right apical lesion. Stable appearance of posterior right upper rib an upper thoracic bony lesions. Slight improvement in right upper lobe tree-in-bud opacity. No new or progressive findings in...   07/28/2015 Imaging   CT CAP- Reduced size of the  right apical pleural parenchymal lesion and reduced size of the right adrenal metastatic lesion. Resolution of prior retrocrural adenopathy.  Right eccentric T1 and T2 sclerosis with sclerosis and tapering of the right second..   11/17/2015 Imaging   CT CAP- Stable soft tissue thickening in the apex of the right hemi thorax. Stable right adrenal metastasis. Nodularity along the trachea and mainstem bronchi, relatively new from 07/28/2015, favoring adherent debris.   11/18/2015 Treatment Plan Change   Zometa HELD for upcoming tooth extraction   11/24/2015 Treatment Plan Change   Zometa on hold at this time in preparation for tooth extraction in March 2017.  Zometa las given on 11/18/2015.   02/03/2016 Imaging   CT CAP- Heterogeneous right apical masslike consolidation and right adrenal metastasis are unchanged   04/06/2016 Treatment Plan Change   Zometa restarted 6 weeks out from tooth extraction (04/06/2016)   05/12/2016 Imaging   CT CAP- NED in the chest, abdomen or pelvis.  Some areas of nodularity associated with the mainstem bronchi in the left upper lobe bronchus, favored to represent adherent inspissated secretions   08/17/2016 Imaging   CT CAP- 1. Stable CTs of the chest and abdomen. No evidence of progressive metastatic disease. 2. Probable treated tumor at the right apex, right adrenal gland and T2 vertebral body, stable. 3. Fluctuating nodularity along the walls of the trachea and mainstem bronchi, likely secretions.   12/12/2016 Imaging   Further decrease in size of treated tumor within the right apex. 2. Stable treated tumor involving the right second rib and T2 vertebra. 3. Stable right adrenal gland treated tumor. 4. Emphysema 5. Aortic atherosclerosis  01/04/2017 Imaging   MRI brain- Normal brain MRI.  No intracranial metastatic disease.   03/15/2017 Imaging   CT CAP- 1. No new or progressive metastatic disease in the chest or abdomen. 2. Stable treated tumor in the  apical right upper lobe. Stable treated right posterior second rib and right T2 vertebral lesions. Stable treated right adrenal metastasis. 3. Aortic atherosclerosis. 4. Moderate emphysema with mild diffuse bronchial wall thickening, suggesting COPD.   06/12/2017 Imaging   CT CAP 1. Similar appearance of treated primary within the right apex. 2. Similar areas of sclerosis within the right second rib, T2, and less so T1 vertebral bodies. These are most consistent with treated metastasis. 3. Similar right adrenal treated metastasis. 4. No evidence of new or progressive disease. 5. Similar right and progressive left areas of bronchial wall thickening and probable mucoid impaction. Correlate with interval infectious symptoms. 6.  Emphysema (ICD10-J43.9). 7. Coronary artery atherosclerosis. Aortic Atherosclerosis (ICD10-I70.0).     10/16/2017 Imaging   CT CAP 1. Stable appearance of the prior Pancoast tumor and related bony findings in the right second rib and right T1 and T2 vertebra compatible with successfully treated tumor. No significant enlargement or new lesions are identified. Similarly the treated right adrenal metastatic lesion is stable in appearance. 2. Upper normal size right hilar lymph node may warrant surveillance. Currently 9 mm in short axis. 3. Other imaging findings of potential clinical significance: Aortic Atherosclerosis (ICD10-I70.0) and Emphysema (ICD10-J43.9). Scattered proximal sigmoid colon diverticula. Mucus plugging medially in the left upper lobe and posteriorly in the right upper lobe.     11/29/2017 - 06/16/2022 Chemotherapy   Patient is on Treatment Plan : LUNG Nivolumab q28d     07/14/2022 -  Chemotherapy   Patient is on Treatment Plan : LUNG Nivolumab (480) q28d       CANCER STAGING:  Cancer Staging  No matching staging information was found for the patient.  INTERVAL HISTORY:  [name], a 66 y.o. female, seen for follow-up of  metastatic lung cancer and toxicity assessment prior to next cycle of nivolumab.  She is reporting energy levels of 100%.  Chronic cough is stable.  Denies any skin rashes, worsening shortness of breath, diarrhea.  Does not report any infections in the last 6 months.  REVIEW OF SYSTEMS:  Review of Systems  Respiratory:  Positive for cough.   Psychiatric/Behavioral:  Positive for sleep disturbance.   All other systems reviewed and are negative.   PAST MEDICAL/SURGICAL HISTORY:  Past Medical History:  Diagnosis Date   Adrenal mass, right ([place]) 07/28/2014   Anemia    Bone metastases 04/05/2016   Cancer ([place]) 2015   lung  right   Diabetes mellitus without complication ([place])    GERD (gastroesophageal reflux disease)    Hyperlipidemia    Hypertension    Hypothyroidism due to medication 01/30/2017   Lung mass 07/28/2014   Reflux    Past Surgical History:  Procedure Laterality Date   AMPUTATION Right 02/22/2017   Procedure: PARTIAL AMPUTATION RIGHT GREAT TOE;  Surgeon: Caprice Beaver, DPM;  Location: [facility];  Service: Podiatry;  Laterality: Right;   APPENDECTOMY     BIOPSY  09/22/2020   Procedure: BIOPSY;  Surgeon: Eloise Harman, DO;  Location: [facility];  Service: Endoscopy;;   COLONOSCOPY WITH PROPOFOL N/A 09/22/2020   non-bleeding internal hemorrhoids, sigmoid and descending colon diverticulosis, localized mild inflammation in sigmoid that was felt due to prep artifact.    ESOPHAGOGASTRODUODENOSCOPY N/A  12/09/2014   GXQ:JJHERD esophageal stricture/mild-to-noderate erosive gastritis. negative H.pylori   FLEXIBLE SIGMOIDOSCOPY  2011   Dr. [name]: hyperplastic polyp   LUNG BIOPSY Right 07/2014   CT guided   MALONEY DILATION N/A 12/09/2014   Procedure: MALONEY DILATION;  Surgeon: [name], MD;  Location: [facility];  Service: Endoscopy;  Laterality: N/A;   PORTACATH PLACEMENT Left 09/01/2014   SAVORY DILATION N/A 12/09/2014   Procedure: SAVORY DILATION;  Surgeon: [name], MD;  Location: [facility];  Service: Endoscopy;  Laterality: N/A;    SOCIAL HISTORY:  Social History   Socioeconomic History   Marital status: Married    Spouse name: Not on file   Number of children: Not on file   Years of education: Not on file   Highest education level: Not on file  Occupational History   Not on file  Tobacco Use   Smoking status: Light Smoker    Packs/day: 0.30    Years: 20.00    Total pack years: 6.00    Types: Cigarettes   Smokeless tobacco: Never  Vaping Use   Vaping Use: Never used  Substance and Sexual Activity   Alcohol use: No   Drug use: No   Sexual activity: Not on file  Other Topics Concern   Not on file  Social History Narrative   Not on file   Social Determinants of Health   Financial Resource Strain: Low Risk  (12/03/2020)   Overall Financial Resource Strain (CARDIA)    Difficulty of Paying Living Expenses: Not hard at all  Food Insecurity: No Food Insecurity (12/03/2020)   Hunger Vital Sign    Worried About Running Out of Food in the Last Year: Never true    Ran Out of Food in the Last Year: Never true  Transportation Needs: No Transportation Needs (12/03/2020)   PRAPARE - Hydrologist (Medical): No    Lack of Transportation (Non-Medical): No  Physical Activity: Not on file  Stress: No Stress Concern Present (12/03/2020)   [place]    Feeling of Stress : Only a little  Social Connections: Moderately Isolated (12/03/2020)   Social Connection and Isolation Panel [NHANES]    Frequency of Communication with Friends and Family: More than three times a week    Frequency of Social Gatherings with Friends and Family: More than three times a week    Attends Religious Services: Never    Marine scientist or Organizations: No    Attends Archivist Meetings: Never    Marital Status: Married  Human resources officer Violence: Not  At Risk (12/03/2020)   Humiliation, Afraid, Rape, and Kick questionnaire    Fear of Current or Ex-Partner: No    Emotionally Abused: No    Physically Abused: No    Sexually Abused: No    FAMILY HISTORY:  Family History  Problem Relation Age of Onset   Cancer Sister    Colon cancer Neg Hx     CURRENT MEDICATIONS:  Current Outpatient Medications  Medication Sig Dispense Refill   albuterol (VENTOLIN HFA) 108 (90 Base) MCG/ACT inhaler Inhale 2 puffs into the lungs every 6 (six) hours as needed for wheezing or shortness of breath.     dronabinol (MARINOL) 2.5 MG capsule Take 1 capsule (2.5 mg total) by mouth 2 (two) times daily before lunch and supper. 60 capsule 5   ENSURE (ENSURE) Take 1 Can by  mouth 4 (four) times daily.     Ferrous Sulfate (IRON) 325 (65 Fe) MG TABS Take 325 mg by mouth daily.      fluticasone (FLONASE) 50 MCG/ACT nasal spray fluticasone propionate 50 mcg/actuation nasal spray,suspension     fluticasone (FLONASE) 50 MCG/ACT nasal spray Place into both nostrils.     fluticasone-salmeterol (ADVAIR DISKUS) 100-50 MCG/ACT AEPB INHALE 1 DOSE BY MOUTH TWICE DAILY     Fluticasone-Salmeterol (ADVAIR DISKUS) 500-50 MCG/DOSE AEPB Inhale 1 puff into the lungs 2 (two) times daily. 60 each 6   levocetirizine (XYZAL) 5 MG tablet Take 5 mg by mouth every evening.      levothyroxine (SYNTHROID) 25 MCG tablet TAKE 1/2 (ONE-HALF) TABLET BY MOUTH ONCE DAILY BEFORE BREAKFAST 45 tablet 3   lidocaine-prilocaine (EMLA) cream Apply 1 Application topically as needed (port access). 30 g 3   naloxone (NARCAN) nasal spray 4 mg/0.1 mL      Nivolumab (OPDIVO IV) Inject into the vein every 28 (twenty-eight) days.     omeprazole (PRILOSEC) 40 MG capsule Take 1 capsule (40 mg total) by mouth daily. 30 capsule 11   Oxycodone HCl 10 MG TABS Take 1 tablet (10 mg total) by mouth every 12 (twelve) hours as needed (pain.). 60 tablet 0   promethazine (PHENERGAN) 6.25 MG/5ML syrup every 4 (four) hours as  needed.     pseudoephedrine-guaifenesin (MUCINEX D) 60-600 MG 12 hr tablet Take 1 tablet by mouth daily as needed for congestion.     rosuvastatin (CRESTOR) 20 MG tablet Take 1 tablet by mouth daily.     rosuvastatin (CRESTOR) 20 MG tablet Take 20 mg by mouth daily.     simvastatin (ZOCOR) 40 MG tablet simvastatin 40 mg tablet  TAKE 1 TABLET BY MOUTH ONCE DAILY     No current facility-administered medications for this visit.   Facility-Administered Medications Ordered in Other Visits  Medication Dose Route Frequency Provider Last Rate Last Admin   influenza vaccine adjuvanted (FLUAD) injection 0.5 mL  0.5 mL Intramuscular Once Derek Jack, MD       sodium chloride flush (NS) 0.9 % injection 10 mL  10 mL Intracatheter PRN Derek Jack, MD   10 mL at 01/03/19 0930    ALLERGIES:  Allergies  Allergen Reactions   Xgeva [Denosumab]     osteonecrosis    PHYSICAL EXAM:  Performance status (ECOG): 0 - Asymptomatic  There were no vitals filed for this visit.  Wt Readings from Last 3 Encounters:  09/08/22 97 lb 3.2 oz (44.1 kg)  08/11/22 96 lb 12.8 oz (43.9 kg)  07/14/22 96 lb 14.4 oz (44 kg)   Physical Exam Vitals reviewed.  Constitutional:      Appearance: Normal appearance.  Cardiovascular:     Pulses: Normal pulses.     Heart sounds: Normal heart sounds.  Pulmonary:     Effort: Pulmonary effort is normal.     Breath sounds: Normal breath sounds.  Abdominal:     Palpations: Abdomen is soft. There is no mass.     Tenderness: There is no abdominal tenderness.  Neurological:     Mental Status: She is alert.  Psychiatric:        Mood and Affect: Mood normal.        Behavior: Behavior normal.    LABORATORY DATA:  I have reviewed the labs as listed.     Latest Ref Rng & Units 10/06/2022    9:49 AM 09/08/2022    9:48 AM 08/11/2022  9:20 AM  CBC  WBC 4.0 - 10.5 K/uL 9.2  9.5  9.8   Hemoglobin 12.0 - 15.0 g/dL 14.2  14.5  14.2   Hematocrit 36.0 - 46.0 %  43.0  44.4  42.9   Platelets 150 - 400 K/uL 259  302  255       Latest Ref Rng & Units 10/06/2022    9:49 AM 09/08/2022    9:48 AM 08/11/2022    9:20 AM  CMP  Glucose 70 - 99 mg/dL 107  88  98   BUN 8 - 23 mg/dL 12  12  15    Creatinine 0.44 - 1.00 mg/dL 1.19  1.18  1.25   Sodium 135 - 145 mmol/L 137  139  138   Potassium 3.5 - 5.1 mmol/L 3.9  4.0  4.1   Chloride 98 - 111 mmol/L 106  104  106   CO2 22 - 32 mmol/L 26  27  26    Calcium 8.9 - 10.3 mg/dL 9.1  9.9  9.6   Total Protein 6.5 - 8.1 g/dL 7.4  8.1  7.8   Total Bilirubin 0.3 - 1.2 mg/dL 0.3  0.5  0.6   Alkaline Phos 38 - 126 U/L 64  67  58   AST 15 - 41 U/L 20  21  19    ALT 0 - 44 U/L 16  15  17      DIAGNOSTIC IMAGING:  I have independently reviewed the scans and discussed with the patient. No results found.   ASSESSMENT:  1. Cancer of upper lobe of right lung [facility]) Metastatic adenocarcinoma of the lung to the bones and right adrenal: -Foundation 1 testing shows 14 genomic alterations, no approved therapies. -Opdivo 480 mg monthly started on February 25, 2015. -CT CAP on February 21, 2020 shows no evidence of progression or metastatic disease.  T2 sclerotic lesion is stable. -CTAP on 08/10/2020 with no evidence of recurrent metastatic disease in the abdomen or pelvis.  Irregular mildly enlarged right adrenal gland unchanged. -CT chest on 09/09/2020 showed partial clearing of the airspace opacities noted medially in the lingula and left lower lobe.  New irregular nodule posteriorly at the right apex likely inflammatory.  Stable sclerotic T2 vertebral body lesion. -Reviewed CT CAP from 02/19/2021 which showed stable to slight decrease in size of mediastinal lymph nodes.  Stable posttreatment changes in the left lung apex and slight decrease in size of the absent irregular nodule.  Stable sclerotic lesion in T2 vertebral body.  No new progressive findings.    PLAN:  1.  Metastatic lung adenocarcinoma: - CT CAP (07/07/2022): Postradiation  scarring in the apex of the right hemithorax with no evidence of recurrence or metastatic disease. - She is tolerating nivolumab without any major side effects. - Reviewed labs today which showed normal LFTs and CBC.  TSH was normal. - Proceed with monthly Opdivo.  RTC 3 months for follow-up.  Plan to repeat CT CAP prior to next visit.   2.  High risk drug monitoring: - Continue Synthroid 25 mcg daily.  TSH today is 1.8.   3.  Health maintenance: - Mammogram on 07/18/2022 was BI-RADS Category 1.   4.  CKD: - Creatinine today is 1.19.  Baseline creatinine between 1.2-1.3.   5.  Bone metastasis: - Bisphosphonates on hold due to ONJ in 2019.  6.  Chronic pains: - Continue oxycodone as needed.  7.  Weight loss: - Continue Marinol 2.5 mg twice daily.  Weight is  stable.   Orders placed this encounter:  No orders of the defined types were placed in this encounter.     Derek Jack, MD [place] [phone]

## 2022-10-08 LAB — T4: T4, Total: 6.8 ug/dL (ref 4.5–12.0)

## 2022-10-19 DIAGNOSIS — R7301 Impaired fasting glucose: Secondary | ICD-10-CM | POA: Diagnosis not present

## 2022-10-19 DIAGNOSIS — E782 Mixed hyperlipidemia: Secondary | ICD-10-CM | POA: Diagnosis not present

## 2022-10-19 DIAGNOSIS — R946 Abnormal results of thyroid function studies: Secondary | ICD-10-CM | POA: Diagnosis not present

## 2022-10-26 DIAGNOSIS — R946 Abnormal results of thyroid function studies: Secondary | ICD-10-CM | POA: Diagnosis not present

## 2022-10-26 DIAGNOSIS — C3490 Malignant neoplasm of unspecified part of unspecified bronchus or lung: Secondary | ICD-10-CM | POA: Diagnosis not present

## 2022-10-26 DIAGNOSIS — J4520 Mild intermittent asthma, uncomplicated: Secondary | ICD-10-CM | POA: Diagnosis not present

## 2022-10-26 DIAGNOSIS — M545 Low back pain, unspecified: Secondary | ICD-10-CM | POA: Diagnosis not present

## 2022-10-26 DIAGNOSIS — R7301 Impaired fasting glucose: Secondary | ICD-10-CM | POA: Diagnosis not present

## 2022-10-26 DIAGNOSIS — I700 Atherosclerosis of aorta: Secondary | ICD-10-CM | POA: Diagnosis not present

## 2022-10-26 DIAGNOSIS — E782 Mixed hyperlipidemia: Secondary | ICD-10-CM | POA: Diagnosis not present

## 2022-10-26 DIAGNOSIS — K219 Gastro-esophageal reflux disease without esophagitis: Secondary | ICD-10-CM | POA: Diagnosis not present

## 2022-10-26 DIAGNOSIS — N1831 Chronic kidney disease, stage 3a: Secondary | ICD-10-CM | POA: Diagnosis not present

## 2022-10-26 DIAGNOSIS — D509 Iron deficiency anemia, unspecified: Secondary | ICD-10-CM | POA: Diagnosis not present

## 2022-10-26 DIAGNOSIS — E440 Moderate protein-calorie malnutrition: Secondary | ICD-10-CM | POA: Diagnosis not present

## 2022-10-31 ENCOUNTER — Other Ambulatory Visit (HOSPITAL_COMMUNITY): Payer: Self-pay | Admitting: Hematology

## 2022-10-31 DIAGNOSIS — E032 Hypothyroidism due to medicaments and other exogenous substances: Secondary | ICD-10-CM

## 2022-11-01 ENCOUNTER — Encounter: Payer: Self-pay | Admitting: Hematology

## 2022-11-03 ENCOUNTER — Inpatient Hospital Stay: Payer: Medicare HMO

## 2022-11-03 ENCOUNTER — Inpatient Hospital Stay: Payer: Medicare HMO | Attending: Hematology

## 2022-11-03 ENCOUNTER — Other Ambulatory Visit: Payer: Self-pay | Admitting: *Deleted

## 2022-11-03 VITALS — BP 116/75 | HR 85 | Temp 97.9°F | Resp 18 | Wt 96.3 lb

## 2022-11-03 DIAGNOSIS — J439 Emphysema, unspecified: Secondary | ICD-10-CM | POA: Diagnosis not present

## 2022-11-03 DIAGNOSIS — Z79899 Other long term (current) drug therapy: Secondary | ICD-10-CM | POA: Diagnosis not present

## 2022-11-03 DIAGNOSIS — F17210 Nicotine dependence, cigarettes, uncomplicated: Secondary | ICD-10-CM | POA: Diagnosis not present

## 2022-11-03 DIAGNOSIS — Z5112 Encounter for antineoplastic immunotherapy: Secondary | ICD-10-CM | POA: Insufficient documentation

## 2022-11-03 DIAGNOSIS — Z79891 Long term (current) use of opiate analgesic: Secondary | ICD-10-CM | POA: Insufficient documentation

## 2022-11-03 DIAGNOSIS — G8929 Other chronic pain: Secondary | ICD-10-CM | POA: Insufficient documentation

## 2022-11-03 DIAGNOSIS — C3411 Malignant neoplasm of upper lobe, right bronchus or lung: Secondary | ICD-10-CM | POA: Insufficient documentation

## 2022-11-03 DIAGNOSIS — C7951 Secondary malignant neoplasm of bone: Secondary | ICD-10-CM | POA: Insufficient documentation

## 2022-11-03 DIAGNOSIS — R634 Abnormal weight loss: Secondary | ICD-10-CM | POA: Diagnosis not present

## 2022-11-03 DIAGNOSIS — Z79890 Hormone replacement therapy (postmenopausal): Secondary | ICD-10-CM | POA: Diagnosis not present

## 2022-11-03 DIAGNOSIS — N189 Chronic kidney disease, unspecified: Secondary | ICD-10-CM | POA: Insufficient documentation

## 2022-11-03 DIAGNOSIS — K8020 Calculus of gallbladder without cholecystitis without obstruction: Secondary | ICD-10-CM | POA: Insufficient documentation

## 2022-11-03 DIAGNOSIS — C7971 Secondary malignant neoplasm of right adrenal gland: Secondary | ICD-10-CM | POA: Insufficient documentation

## 2022-11-03 DIAGNOSIS — I129 Hypertensive chronic kidney disease with stage 1 through stage 4 chronic kidney disease, or unspecified chronic kidney disease: Secondary | ICD-10-CM | POA: Diagnosis not present

## 2022-11-03 LAB — COMPREHENSIVE METABOLIC PANEL
ALT: 14 U/L (ref 0–44)
AST: 21 U/L (ref 15–41)
Albumin: 4.2 g/dL (ref 3.5–5.0)
Alkaline Phosphatase: 62 U/L (ref 38–126)
Anion gap: 7 (ref 5–15)
BUN: 10 mg/dL (ref 8–23)
CO2: 24 mmol/L (ref 22–32)
Calcium: 9.1 mg/dL (ref 8.9–10.3)
Chloride: 104 mmol/L (ref 98–111)
Creatinine, Ser: 1.11 mg/dL — ABNORMAL HIGH (ref 0.44–1.00)
GFR, Estimated: 55 mL/min — ABNORMAL LOW (ref >60)
Glucose, Bld: 98 mg/dL (ref 70–99)
Potassium: 3.7 mmol/L (ref 3.5–5.1)
Sodium: 135 mmol/L (ref 135–145)
Total Bilirubin: 0.5 mg/dL (ref 0.3–1.2)
Total Protein: 8 g/dL (ref 6.5–8.1)

## 2022-11-03 LAB — CBC WITH DIFFERENTIAL/PLATELET
Abs Immature Granulocytes: 0.02 10*3/uL (ref 0.00–0.07)
Basophils Absolute: 0 10*3/uL (ref 0.0–0.1)
Basophils Relative: 0 %
Eosinophils Absolute: 0.2 10*3/uL (ref 0.0–0.5)
Eosinophils Relative: 2 %
HCT: 44 % (ref 36.0–46.0)
Hemoglobin: 14.3 g/dL (ref 12.0–15.0)
Immature Granulocytes: 0 %
Lymphocytes Relative: 27 %
Lymphs Abs: 2.2 10*3/uL (ref 0.7–4.0)
MCH: 30.7 pg (ref 26.0–34.0)
MCHC: 32.5 g/dL (ref 30.0–36.0)
MCV: 94.4 fL (ref 80.0–100.0)
Monocytes Absolute: 0.5 10*3/uL (ref 0.1–1.0)
Monocytes Relative: 7 %
Neutro Abs: 5 10*3/uL (ref 1.7–7.7)
Neutrophils Relative %: 64 %
Platelets: 265 10*3/uL (ref 150–400)
RBC: 4.66 MIL/uL (ref 3.87–5.11)
RDW: 15.2 % (ref 11.5–15.5)
WBC: 8 10*3/uL (ref 4.0–10.5)
nRBC: 0 % (ref 0.0–0.2)

## 2022-11-03 LAB — TSH: TSH: 2.271 u[IU]/mL (ref 0.350–4.500)

## 2022-11-03 MED ORDER — SODIUM CHLORIDE 0.9 % IV SOLN
480.0000 mg | Freq: Once | INTRAVENOUS | Status: AC
  Administered 2022-11-03: 480 mg via INTRAVENOUS
  Filled 2022-11-03: qty 48

## 2022-11-03 MED ORDER — OXYCODONE HCL 10 MG PO TABS: 10.0000 mg | ORAL_TABLET | Freq: Two times a day (BID) | ORAL | 0 refills | Status: DC | PRN

## 2022-11-03 MED ORDER — HEPARIN SOD (PORK) LOCK FLUSH 100 UNIT/ML IV SOLN
500.0000 [IU] | Freq: Once | INTRAVENOUS | Status: AC | PRN
  Administered 2022-11-03: 500 [IU]

## 2022-11-03 MED ORDER — SODIUM CHLORIDE 0.9% FLUSH
10.0000 mL | INTRAVENOUS | Status: DC | PRN
  Administered 2022-11-03: 10 mL

## 2022-11-03 MED ORDER — SODIUM CHLORIDE 0.9 % IV SOLN: Freq: Once | INTRAVENOUS | Status: AC

## 2022-11-03 NOTE — Patient Instructions (Signed)
Melissa [facility]  Discharge Instructions: Thank you for choosing [facility] to provide your oncology and hematology care.  If you have a lab appointment with the [facility], please come in thru the Main Entrance and check in at the main information desk.  Wear comfortable clothing and clothing appropriate for easy access to any Portacath or PICC line.   We strive to give you quality time with your provider. You may need to reschedule your appointment if you arrive late (15 or more minutes).  Arriving late affects you and other patients whose appointments are after yours.  Also, if you miss three or more appointments without notifying the office, you may be dismissed from the clinic at the provider's discretion.      For prescription refill requests, have your pharmacy contact our office and allow 72 hours for refills to be completed.    Today you received the following chemotherapy and/or immunotherapy agents Opdivo   To help prevent nausea and vomiting after your treatment, we encourage you to take your nausea medication as directed.   Nivolumab Injection What is this medication? NIVOLUMAB (nye VOL ue mab) treats some types of cancer. It works by helping your immune system slow or stop the spread of cancer cells. It is a monoclonal antibody. This medicine may be used for other purposes; ask your health care provider or pharmacist if you have questions. COMMON BRAND NAME(S): Opdivo What should I tell my care team before I take this medication? They need to know if you have any of these conditions: Allogeneic stem cell transplant (uses someone else's stem cells) Autoimmune diseases, such as Crohn disease, ulcerative colitis, lupus History of chest radiation Nervous system problems, such as Guillain-Barre syndrome or myasthenia gravis Organ transplant An unusual or allergic reaction to nivolumab, other medications, foods, dyes, or preservatives Pregnant or  trying to get pregnant Breast-feeding How should I use this medication? This medication is infused into a vein. It is given in a hospital or clinic setting. A special MedGuide will be given to you before each treatment. Be sure to read this information carefully each time. Talk to your care team about the use of this medication in children. While it may be prescribed for children as young as 12 years for selected conditions, precautions do apply. Overdosage: If you think you have taken too much of this medicine contact a poison control center or emergency room at once. NOTE: This medicine is only for you. Do not share this medicine with others. What if I miss a dose? Keep appointments for follow-up doses. It is important not to miss your dose. Call your care team if you are unable to keep an appointment. What may interact with this medication? Interactions have not been studied. This list may not describe all possible interactions. Give your health care provider a list of all the medicines, herbs, non-prescription drugs, or dietary supplements you use. Also tell them if you smoke, drink alcohol, or use illegal drugs. Some items may interact with your medicine. What should I watch for while using this medication? Your condition will be monitored carefully while you are receiving this medication. You may need blood work while taking this medication. This medication may cause serious skin reactions. They can happen weeks to months after starting the medication. Contact your care team right away if you notice fevers or flu-like symptoms with a rash. The rash may be red or purple and then turn into blisters or peeling  of the skin. You may also notice a red rash with swelling of the face, lips, or lymph nodes in your neck or under your arms. Tell your care team right away if you have any change in your eyesight. Talk to your care team if you are pregnant or think you might be pregnant. A negative  pregnancy test is required before starting this medication. A reliable form of contraception is recommended while taking this medication and for 5 months after the last dose. Talk to your care team about effective forms of contraception. Do not breast-feed while taking this medication and for 5 months after the last dose. What side effects may I notice from receiving this medication? Side effects that you should report to your care team as soon as possible: Allergic reactions--skin rash, itching, hives, swelling of the face, lips, tongue, or throat Dry cough, shortness of breath or trouble breathing Eye pain, redness, irritation, or discharge with blurry or decreased vision Heart muscle inflammation--unusual weakness or fatigue, shortness of breath, chest pain, fast or irregular heartbeat, dizziness, swelling of the ankles, feet, or hands Hormone gland problems--headache, sensitivity to light, unusual weakness or fatigue, dizziness, fast or irregular heartbeat, increased sensitivity to cold or heat, excessive sweating, constipation, hair loss, increased thirst or amount of urine, tremors or shaking, irritability Infusion reactions--chest pain, shortness of breath or trouble breathing, feeling faint or lightheaded Kidney injury (glomerulonephritis)--decrease in the amount of urine, red or dark brown urine, foamy or bubbly urine, swelling of the ankles, hands, or feet Liver injury--right upper belly pain, loss of appetite, nausea, light-colored stool, dark yellow or brown urine, yellowing skin or eyes, unusual weakness or fatigue Pain, tingling, or numbness in the hands or feet, muscle weakness, change in vision, confusion or trouble speaking, loss of balance or coordination, trouble walking, seizures Rash, fever, and swollen lymph nodes Redness, blistering, peeling, or loosening of the skin, including inside the mouth Sudden or severe stomach pain, bloody diarrhea, fever, nausea, vomiting Side effects  that usually do not require medical attention (report these to your care team if they continue or are bothersome): Bone, joint, or muscle pain Diarrhea Fatigue Loss of appetite Nausea Skin rash This list may not describe all possible side effects. Call your doctor for medical advice about side effects. You may report side effects to FDA at 1-800-FDA-1088. Where should I keep my medication? This medication is given in a hospital or clinic. It will not be stored at home. NOTE: This sheet is a summary. It may not cover all possible information. If you have questions about this medicine, talk to your doctor, pharmacist, or health care provider.  2023 Elsevier/Gold Standard (2022-03-07 00:00:00)  BELOW ARE SYMPTOMS THAT SHOULD BE REPORTED IMMEDIATELY: *FEVER GREATER THAN 100.4 F (38 C) OR HIGHER *CHILLS OR SWEATING *NAUSEA AND VOMITING THAT IS NOT CONTROLLED WITH YOUR NAUSEA MEDICATION *UNUSUAL SHORTNESS OF BREATH *UNUSUAL BRUISING OR BLEEDING *URINARY PROBLEMS (pain or burning when urinating, or frequent urination) *BOWEL PROBLEMS (unusual diarrhea, constipation, pain near the anus) TENDERNESS IN MOUTH AND THROAT WITH OR WITHOUT PRESENCE OF ULCERS (sore throat, sores in mouth, or a toothache) UNUSUAL RASH, SWELLING OR PAIN  UNUSUAL VAGINAL DISCHARGE OR ITCHING   Items with * indicate a potential emergency and should be followed up as soon as possible or go to the Emergency Department if any problems should occur.  Please show the CHEMOTHERAPY ALERT CARD or IMMUNOTHERAPY ALERT CARD at check-in to the Emergency Department and triage nurse.  Should you  have questions after your visit or need to cancel or reschedule your appointment, please contact [facility] [phone]  and follow the prompts.  Office hours are 8:00 a.m. to 4:30 p.m. Monday - Friday. Please note that voicemails left after 4:00 p.m. may not be returned until the following business day.  We are closed  weekends and major holidays. You have access to a nurse at all times for urgent questions. Please call the main number to the clinic [phone] and follow the prompts.  For any non-urgent questions, you may also contact your provider using MyChart. We now offer e-Visits for anyone 6 and older to request care online for non-urgent symptoms. For details visit [email or web address].   Also download the MyChart app! Go to the app store, search "MyChart", open the app, select [facility], and log in with your MyChart username and password.  Masks are optional in the cancer centers. If you would like for your care team to wear a mask while they are taking care of you, please let them know. You may have one support person who is at least 66 years old accompany you for your appointments.

## 2022-11-03 NOTE — Progress Notes (Signed)
Pt presents today for Opdivo per provider's order. Vital signs and labs WNL for treatment today. Okay to proceed with treatment today.  Opdivo given today per MD orders. Tolerated infusion without adverse affects. Vital signs stable. No complaints at this time. Discharged from clinic ambulatory in stable condition. Alert and oriented x 3. F/U with [facility] as scheduled.

## 2022-11-09 ENCOUNTER — Encounter: Payer: Self-pay | Admitting: *Deleted

## 2022-11-09 NOTE — Progress Notes (Unsigned)
Approval received from [facility] for Dronabinol through 11/21/23.

## 2022-12-02 ENCOUNTER — Inpatient Hospital Stay: Payer: Medicare HMO

## 2022-12-02 ENCOUNTER — Inpatient Hospital Stay: Payer: Medicare HMO | Attending: Hematology

## 2022-12-02 VITALS — BP 117/68 | HR 87 | Temp 98.2°F | Resp 18 | Wt 99.8 lb

## 2022-12-02 DIAGNOSIS — C3411 Malignant neoplasm of upper lobe, right bronchus or lung: Secondary | ICD-10-CM

## 2022-12-02 DIAGNOSIS — K8020 Calculus of gallbladder without cholecystitis without obstruction: Secondary | ICD-10-CM | POA: Insufficient documentation

## 2022-12-02 DIAGNOSIS — J439 Emphysema, unspecified: Secondary | ICD-10-CM | POA: Diagnosis not present

## 2022-12-02 DIAGNOSIS — Z79899 Other long term (current) drug therapy: Secondary | ICD-10-CM | POA: Insufficient documentation

## 2022-12-02 DIAGNOSIS — Z79891 Long term (current) use of opiate analgesic: Secondary | ICD-10-CM | POA: Diagnosis not present

## 2022-12-02 DIAGNOSIS — I129 Hypertensive chronic kidney disease with stage 1 through stage 4 chronic kidney disease, or unspecified chronic kidney disease: Secondary | ICD-10-CM | POA: Diagnosis not present

## 2022-12-02 DIAGNOSIS — G8929 Other chronic pain: Secondary | ICD-10-CM | POA: Diagnosis not present

## 2022-12-02 DIAGNOSIS — Z5112 Encounter for antineoplastic immunotherapy: Secondary | ICD-10-CM | POA: Insufficient documentation

## 2022-12-02 DIAGNOSIS — F17210 Nicotine dependence, cigarettes, uncomplicated: Secondary | ICD-10-CM | POA: Diagnosis not present

## 2022-12-02 DIAGNOSIS — N189 Chronic kidney disease, unspecified: Secondary | ICD-10-CM | POA: Insufficient documentation

## 2022-12-02 DIAGNOSIS — C7951 Secondary malignant neoplasm of bone: Secondary | ICD-10-CM | POA: Insufficient documentation

## 2022-12-02 DIAGNOSIS — Z79890 Hormone replacement therapy (postmenopausal): Secondary | ICD-10-CM | POA: Diagnosis not present

## 2022-12-02 DIAGNOSIS — R634 Abnormal weight loss: Secondary | ICD-10-CM | POA: Insufficient documentation

## 2022-12-02 DIAGNOSIS — C7971 Secondary malignant neoplasm of right adrenal gland: Secondary | ICD-10-CM | POA: Diagnosis not present

## 2022-12-02 LAB — COMPREHENSIVE METABOLIC PANEL
ALT: 14 U/L (ref 0–44)
AST: 22 U/L (ref 15–41)
Albumin: 3.9 g/dL (ref 3.5–5.0)
Alkaline Phosphatase: 63 U/L (ref 38–126)
Anion gap: 9 (ref 5–15)
BUN: 13 mg/dL (ref 8–23)
CO2: 25 mmol/L (ref 22–32)
Calcium: 9.1 mg/dL (ref 8.9–10.3)
Chloride: 103 mmol/L (ref 98–111)
Creatinine, Ser: 1.13 mg/dL — ABNORMAL HIGH (ref 0.44–1.00)
GFR, Estimated: 54 mL/min — ABNORMAL LOW (ref >60)
Glucose, Bld: 99 mg/dL (ref 70–99)
Potassium: 3.8 mmol/L (ref 3.5–5.1)
Sodium: 137 mmol/L (ref 135–145)
Total Bilirubin: 0.4 mg/dL (ref 0.3–1.2)
Total Protein: 7.4 g/dL (ref 6.5–8.1)

## 2022-12-02 LAB — CBC WITH DIFFERENTIAL/PLATELET
Abs Immature Granulocytes: 0.02 10*3/uL (ref 0.00–0.07)
Basophils Absolute: 0 10*3/uL (ref 0.0–0.1)
Basophils Relative: 0 %
Eosinophils Absolute: 0.1 10*3/uL (ref 0.0–0.5)
Eosinophils Relative: 1 %
HCT: 41 % (ref 36.0–46.0)
Hemoglobin: 13.5 g/dL (ref 12.0–15.0)
Immature Granulocytes: 0 %
Lymphocytes Relative: 21 %
Lymphs Abs: 2 10*3/uL (ref 0.7–4.0)
MCH: 30.8 pg (ref 26.0–34.0)
MCHC: 32.9 g/dL (ref 30.0–36.0)
MCV: 93.6 fL (ref 80.0–100.0)
Monocytes Absolute: 0.6 10*3/uL (ref 0.1–1.0)
Monocytes Relative: 6 %
Neutro Abs: 7 10*3/uL (ref 1.7–7.7)
Neutrophils Relative %: 72 %
Platelets: 251 10*3/uL (ref 150–400)
RBC: 4.38 MIL/uL (ref 3.87–5.11)
RDW: 15.2 % (ref 11.5–15.5)
WBC: 9.7 10*3/uL (ref 4.0–10.5)
nRBC: 0 % (ref 0.0–0.2)

## 2022-12-02 LAB — TSH: TSH: 1.865 u[IU]/mL (ref 0.350–4.500)

## 2022-12-02 MED ORDER — SODIUM CHLORIDE 0.9 % IV SOLN
480.0000 mg | Freq: Once | INTRAVENOUS | Status: AC
  Administered 2022-12-02: 480 mg via INTRAVENOUS
  Filled 2022-12-02: qty 48

## 2022-12-02 MED ORDER — SODIUM CHLORIDE 0.9 % IV SOLN: Freq: Once | INTRAVENOUS | Status: AC

## 2022-12-02 MED ORDER — HEPARIN SOD (PORK) LOCK FLUSH 100 UNIT/ML IV SOLN
500.0000 [IU] | Freq: Once | INTRAVENOUS | Status: AC | PRN
  Administered 2022-12-02: 500 [IU]

## 2022-12-02 MED ORDER — SODIUM CHLORIDE 0.9% FLUSH
10.0000 mL | INTRAVENOUS | Status: DC | PRN
  Administered 2022-12-02: 10 mL

## 2022-12-02 NOTE — Patient Instructions (Signed)
[facility]  Discharge Instructions: Thank you for choosing [facility] to provide your oncology and hematology care.  If you have a lab appointment with the Cancer Center, please come in thru the Main Entrance and check in at the main information desk.  Wear comfortable clothing and clothing appropriate for easy access to any Portacath or PICC line.   We strive to give you quality time with your provider. You may need to reschedule your appointment if you arrive late (15 or more minutes).  Arriving late affects you and other patients whose appointments are after yours.  Also, if you miss three or more appointments without notifying the office, you may be dismissed from the clinic at the provider's discretion.      For prescription refill requests, have your pharmacy contact our office and allow 72 hours for refills to be completed.    Today you received the following chemotherapy and/or immunotherapy agents Opdivo   To help prevent nausea and vomiting after your treatment, we encourage you to take your nausea medication as directed.   Nivolumab Injection What is this medication? NIVOLUMAB (nye VOL ue mab) treats some types of cancer. It works by helping your immune system slow or stop the spread of cancer cells. It is a monoclonal antibody. This medicine may be used for other purposes; ask your health care provider or pharmacist if you have questions. COMMON BRAND NAME(S): Opdivo What should I tell my care team before I take this medication? They need to know if you have any of these conditions: Allogeneic stem cell transplant (uses someone else's stem cells) Autoimmune diseases, such as Crohn disease, ulcerative colitis, lupus History of chest radiation Nervous system problems, such as Guillain-Barre syndrome or myasthenia gravis Organ transplant An unusual or allergic reaction to nivolumab, other medications, foods, dyes, or preservatives Pregnant or  trying to get pregnant Breast-feeding How should I use this medication? This medication is infused into a vein. It is given in a hospital or clinic setting. A special MedGuide will be given to you before each treatment. Be sure to read this information carefully each time. Talk to your care team about the use of this medication in children. While it may be prescribed for children as young as 12 years for selected conditions, precautions do apply. Overdosage: If you think you have taken too much of this medicine contact a poison control center or emergency room at once. NOTE: This medicine is only for you. Do not share this medicine with others. What if I miss a dose? Keep appointments for follow-up doses. It is important not to miss your dose. Call your care team if you are unable to keep an appointment. What may interact with this medication? Interactions have not been studied. This list may not describe all possible interactions. Give your health care provider a list of all the medicines, herbs, non-prescription drugs, or dietary supplements you use. Also tell them if you smoke, drink alcohol, or use illegal drugs. Some items may interact with your medicine. What should I watch for while using this medication? Your condition will be monitored carefully while you are receiving this medication. You may need blood work while taking this medication. This medication may cause serious skin reactions. They can happen weeks to months after starting the medication. Contact your care team right away if you notice fevers or flu-like symptoms with a rash. The rash may be red or purple and then turn into blisters or peeling  of the skin. You may also notice a red rash with swelling of the face, lips, or lymph nodes in your neck or under your arms. Tell your care team right away if you have any change in your eyesight. Talk to your care team if you are pregnant or think you might be pregnant. A negative  pregnancy test is required before starting this medication. A reliable form of contraception is recommended while taking this medication and for 5 months after the last dose. Talk to your care team about effective forms of contraception. Do not breast-feed while taking this medication and for 5 months after the last dose. What side effects may I notice from receiving this medication? Side effects that you should report to your care team as soon as possible: Allergic reactions--skin rash, itching, hives, swelling of the face, lips, tongue, or throat Dry cough, shortness of breath or trouble breathing Eye pain, redness, irritation, or discharge with blurry or decreased vision Heart muscle inflammation--unusual weakness or fatigue, shortness of breath, chest pain, fast or irregular heartbeat, dizziness, swelling of the ankles, feet, or hands Hormone gland problems--headache, sensitivity to light, unusual weakness or fatigue, dizziness, fast or irregular heartbeat, increased sensitivity to cold or heat, excessive sweating, constipation, hair loss, increased thirst or amount of urine, tremors or shaking, irritability Infusion reactions--chest pain, shortness of breath or trouble breathing, feeling faint or lightheaded Kidney injury (glomerulonephritis)--decrease in the amount of urine, red or dark brown urine, foamy or bubbly urine, swelling of the ankles, hands, or feet Liver injury--right upper belly pain, loss of appetite, nausea, light-colored stool, dark yellow or brown urine, yellowing skin or eyes, unusual weakness or fatigue Pain, tingling, or numbness in the hands or feet, muscle weakness, change in vision, confusion or trouble speaking, loss of balance or coordination, trouble walking, seizures Rash, fever, and swollen lymph nodes Redness, blistering, peeling, or loosening of the skin, including inside the mouth Sudden or severe stomach pain, bloody diarrhea, fever, nausea, vomiting Side effects  that usually do not require medical attention (report these to your care team if they continue or are bothersome): Bone, joint, or muscle pain Diarrhea Fatigue Loss of appetite Nausea Skin rash This list may not describe all possible side effects. Call your doctor for medical advice about side effects. You may report side effects to FDA at 1-800-FDA-1088. Where should I keep my medication? This medication is given in a hospital or clinic. It will not be stored at home. NOTE: This sheet is a summary. It may not cover all possible information. If you have questions about this medicine, talk to your doctor, pharmacist, or health care provider.  2023 Elsevier/Gold Standard (2022-03-07 00:00:00)  BELOW ARE SYMPTOMS THAT SHOULD BE REPORTED IMMEDIATELY: *FEVER GREATER THAN 100.4 F (38 C) OR HIGHER *CHILLS OR SWEATING *NAUSEA AND VOMITING THAT IS NOT CONTROLLED WITH YOUR NAUSEA MEDICATION *UNUSUAL SHORTNESS OF BREATH *UNUSUAL BRUISING OR BLEEDING *URINARY PROBLEMS (pain or burning when urinating, or frequent urination) *BOWEL PROBLEMS (unusual diarrhea, constipation, pain near the anus) TENDERNESS IN MOUTH AND THROAT WITH OR WITHOUT PRESENCE OF ULCERS (sore throat, sores in mouth, or a toothache) UNUSUAL RASH, SWELLING OR PAIN  UNUSUAL VAGINAL DISCHARGE OR ITCHING   Items with * indicate a potential emergency and should be followed up as soon as possible or go to the Emergency Department if any problems should occur.  Please show the CHEMOTHERAPY ALERT CARD or IMMUNOTHERAPY ALERT CARD at check-in to the Emergency Department and triage nurse.  Should you  have questions after your visit or need to cancel or reschedule your appointment, please contact [facility] [phone]  and follow the prompts.  Office hours are 8:00 a.m. to 4:30 p.m. Monday - Friday. Please note that voicemails left after 4:00 p.m. may not be returned until the following business day.  We are closed  weekends and major holidays. You have access to a nurse at all times for urgent questions. Please call the main number to the clinic [phone] and follow the prompts.  For any non-urgent questions, you may also contact your provider using MyChart. We now offer e-Visits for anyone 12 and older to request care online for non-urgent symptoms. For details visit [email or web address].   Also download the MyChart app! Go to the app store, search "MyChart", open the app, select [facility], and log in with your MyChart username and password.

## 2022-12-02 NOTE — Progress Notes (Signed)
Pt presents today for Opdivo per provider's order. Vital signs and labs WNL for treatment. Okay to proceed with treatment today.  Opdivo given today per MD orders. Tolerated infusion without adverse affects. Vital signs stable. No complaints at this time. Discharged from clinic ambulatory in stable condition. Alert and oriented x 3. F/U with [facility] as scheduled.

## 2022-12-09 DIAGNOSIS — Z111 Encounter for screening for respiratory tuberculosis: Secondary | ICD-10-CM | POA: Diagnosis not present

## 2022-12-15 ENCOUNTER — Encounter: Payer: Self-pay | Admitting: *Deleted

## 2022-12-15 DIAGNOSIS — C8490 Mature T/NK-cell lymphomas, unspecified, unspecified site: Secondary | ICD-10-CM | POA: Diagnosis not present

## 2022-12-15 DIAGNOSIS — B351 Tinea unguium: Secondary | ICD-10-CM | POA: Diagnosis not present

## 2022-12-15 DIAGNOSIS — G629 Polyneuropathy, unspecified: Secondary | ICD-10-CM | POA: Diagnosis not present

## 2022-12-15 NOTE — Progress Notes (Signed)
Patient brought a paper result of CXR from [facility].  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  Kia at Dr. Juel Burrow office made aware and copy faxed for follow up.  Dr. Delton Coombes also aware.

## 2022-12-19 ENCOUNTER — Encounter: Payer: Self-pay | Admitting: Hematology

## 2022-12-21 ENCOUNTER — Encounter: Payer: Self-pay | Admitting: Hematology

## 2022-12-22 ENCOUNTER — Ambulatory Visit (HOSPITAL_COMMUNITY)
Admission: RE | Admit: 2022-12-22 | Discharge: 2022-12-22 | Disposition: A | Discharge status: Home / Self Care | Payer: Medicare HMO | Source: Ambulatory Visit | Attending: Hematology | Admitting: Hematology

## 2022-12-22 DIAGNOSIS — C3411 Malignant neoplasm of upper lobe, right bronchus or lung: Secondary | ICD-10-CM | POA: Diagnosis not present

## 2022-12-22 DIAGNOSIS — C3490 Malignant neoplasm of unspecified part of unspecified bronchus or lung: Secondary | ICD-10-CM | POA: Diagnosis not present

## 2022-12-22 DIAGNOSIS — J439 Emphysema, unspecified: Secondary | ICD-10-CM | POA: Diagnosis not present

## 2022-12-22 MED ORDER — IOHEXOL 300 MG/ML  SOLN
100.0000 mL | Freq: Once | INTRAMUSCULAR | Status: AC | PRN
  Administered 2022-12-22: 100 mL via INTRAVENOUS

## 2022-12-29 ENCOUNTER — Other Ambulatory Visit: Payer: Self-pay | Admitting: *Deleted

## 2022-12-29 ENCOUNTER — Encounter: Payer: Self-pay | Admitting: Hematology

## 2022-12-29 ENCOUNTER — Inpatient Hospital Stay (HOSPITAL_BASED_OUTPATIENT_CLINIC_OR_DEPARTMENT_OTHER): Payer: Medicare HMO | Admitting: Hematology

## 2022-12-29 ENCOUNTER — Inpatient Hospital Stay: Payer: Medicare HMO

## 2022-12-29 ENCOUNTER — Inpatient Hospital Stay: Payer: Medicare HMO | Attending: Hematology

## 2022-12-29 VITALS — BP 134/73 | HR 77 | Temp 97.2°F | Resp 16

## 2022-12-29 DIAGNOSIS — R634 Abnormal weight loss: Secondary | ICD-10-CM | POA: Insufficient documentation

## 2022-12-29 DIAGNOSIS — I129 Hypertensive chronic kidney disease with stage 1 through stage 4 chronic kidney disease, or unspecified chronic kidney disease: Secondary | ICD-10-CM | POA: Insufficient documentation

## 2022-12-29 DIAGNOSIS — J439 Emphysema, unspecified: Secondary | ICD-10-CM | POA: Diagnosis not present

## 2022-12-29 DIAGNOSIS — C7951 Secondary malignant neoplasm of bone: Secondary | ICD-10-CM | POA: Diagnosis not present

## 2022-12-29 DIAGNOSIS — C7971 Secondary malignant neoplasm of right adrenal gland: Secondary | ICD-10-CM | POA: Diagnosis not present

## 2022-12-29 DIAGNOSIS — C3411 Malignant neoplasm of upper lobe, right bronchus or lung: Secondary | ICD-10-CM | POA: Diagnosis not present

## 2022-12-29 DIAGNOSIS — F17210 Nicotine dependence, cigarettes, uncomplicated: Secondary | ICD-10-CM | POA: Diagnosis not present

## 2022-12-29 DIAGNOSIS — Z5112 Encounter for antineoplastic immunotherapy: Secondary | ICD-10-CM | POA: Diagnosis not present

## 2022-12-29 DIAGNOSIS — Z79890 Hormone replacement therapy (postmenopausal): Secondary | ICD-10-CM | POA: Diagnosis not present

## 2022-12-29 DIAGNOSIS — N189 Chronic kidney disease, unspecified: Secondary | ICD-10-CM | POA: Diagnosis not present

## 2022-12-29 DIAGNOSIS — R630 Anorexia: Secondary | ICD-10-CM

## 2022-12-29 DIAGNOSIS — Z79891 Long term (current) use of opiate analgesic: Secondary | ICD-10-CM | POA: Diagnosis not present

## 2022-12-29 DIAGNOSIS — Z79899 Other long term (current) drug therapy: Secondary | ICD-10-CM | POA: Diagnosis not present

## 2022-12-29 DIAGNOSIS — G8929 Other chronic pain: Secondary | ICD-10-CM | POA: Diagnosis not present

## 2022-12-29 LAB — COMPREHENSIVE METABOLIC PANEL
ALT: 17 U/L (ref 0–44)
AST: 21 U/L (ref 15–41)
Albumin: 4 g/dL (ref 3.5–5.0)
Alkaline Phosphatase: 62 U/L (ref 38–126)
Anion gap: 9 (ref 5–15)
BUN: 15 mg/dL (ref 8–23)
CO2: 24 mmol/L (ref 22–32)
Calcium: 9.3 mg/dL (ref 8.9–10.3)
Chloride: 103 mmol/L (ref 98–111)
Creatinine, Ser: 1.07 mg/dL — ABNORMAL HIGH (ref 0.44–1.00)
GFR, Estimated: 57 mL/min — ABNORMAL LOW (ref >60)
Glucose, Bld: 87 mg/dL (ref 70–99)
Potassium: 4 mmol/L (ref 3.5–5.1)
Sodium: 136 mmol/L (ref 135–145)
Total Bilirubin: 0.5 mg/dL (ref 0.3–1.2)
Total Protein: 7.5 g/dL (ref 6.5–8.1)

## 2022-12-29 LAB — CBC WITH DIFFERENTIAL/PLATELET
Abs Immature Granulocytes: 0.04 10*3/uL (ref 0.00–0.07)
Basophils Absolute: 0 10*3/uL (ref 0.0–0.1)
Basophils Relative: 0 %
Eosinophils Absolute: 0.1 10*3/uL (ref 0.0–0.5)
Eosinophils Relative: 1 %
HCT: 40.2 % (ref 36.0–46.0)
Hemoglobin: 13.2 g/dL (ref 12.0–15.0)
Immature Granulocytes: 0 %
Lymphocytes Relative: 29 %
Lymphs Abs: 2.7 10*3/uL (ref 0.7–4.0)
MCH: 30.9 pg (ref 26.0–34.0)
MCHC: 32.8 g/dL (ref 30.0–36.0)
MCV: 94.1 fL (ref 80.0–100.0)
Monocytes Absolute: 0.7 10*3/uL (ref 0.1–1.0)
Monocytes Relative: 7 %
Neutro Abs: 5.8 10*3/uL (ref 1.7–7.7)
Neutrophils Relative %: 63 %
Platelets: 260 10*3/uL (ref 150–400)
RBC: 4.27 MIL/uL (ref 3.87–5.11)
RDW: 15.6 % — ABNORMAL HIGH (ref 11.5–15.5)
WBC: 9.3 10*3/uL (ref 4.0–10.5)
nRBC: 0 % (ref 0.0–0.2)

## 2022-12-29 LAB — TSH: TSH: 2.475 u[IU]/mL (ref 0.350–4.500)

## 2022-12-29 MED ORDER — SODIUM CHLORIDE 0.9 % IV SOLN
480.0000 mg | Freq: Once | INTRAVENOUS | Status: AC
  Administered 2022-12-29: 480 mg via INTRAVENOUS
  Filled 2022-12-29: qty 48

## 2022-12-29 MED ORDER — DRONABINOL 2.5 MG PO CAPS: 2.5000 mg | ORAL_CAPSULE | Freq: Two times a day (BID) | ORAL | 5 refills | Status: DC

## 2022-12-29 MED ORDER — HEPARIN SOD (PORK) LOCK FLUSH 100 UNIT/ML IV SOLN
500.0000 [IU] | Freq: Once | INTRAVENOUS | Status: AC | PRN
  Administered 2022-12-29: 500 [IU]

## 2022-12-29 MED ORDER — SODIUM CHLORIDE 0.9 % IV SOLN: Freq: Once | INTRAVENOUS | Status: AC

## 2022-12-29 MED ORDER — SODIUM CHLORIDE 0.9% FLUSH
10.0000 mL | INTRAVENOUS | Status: DC | PRN
  Administered 2022-12-29: 10 mL

## 2022-12-29 MED ORDER — OXYCODONE HCL 10 MG PO TABS: 10.0000 mg | ORAL_TABLET | Freq: Two times a day (BID) | ORAL | 0 refills | Status: DC | PRN

## 2022-12-29 NOTE — Patient Instructions (Signed)
Melissa Sullivan  Discharge Instructions: Thank you for choosing [facility] to provide your oncology and hematology care.  If you have a lab appointment with the [facility], please come in thru the Main Entrance and check in at the main information desk.  Wear comfortable clothing and clothing appropriate for easy access to any Portacath or PICC line.   We strive to give you quality time with your provider. You may need to reschedule your appointment if you arrive late (15 or more minutes).  Arriving late affects you and other patients whose appointments are after yours.  Also, if you miss three or more appointments without notifying the office, you may be dismissed from the clinic at the provider's discretion.      For prescription refill requests, have your pharmacy contact our office and allow 72 hours for refills to be completed.    Today you received the following chemotherapy and/or immunotherapy agents Opdivo   To help prevent nausea and vomiting after your treatment, we encourage you to take your nausea medication as directed.  Nivolumab Injection What is this medication? NIVOLUMAB (nye VOL ue mab) treats some types of cancer. It works by helping your immune system slow or stop the spread of cancer cells. It is a monoclonal antibody. This medicine may be used for other purposes; ask your health care provider or pharmacist if you have questions. COMMON BRAND NAME(S): Opdivo What should I tell my care team before I take this medication? They need to know if you have any of these conditions: Allogeneic stem cell transplant (uses someone else's stem cells) Autoimmune diseases, such as Crohn disease, ulcerative colitis, lupus History of chest radiation Nervous system problems, such as Guillain-Barre syndrome or myasthenia gravis Organ transplant An unusual or allergic reaction to nivolumab, other medications, foods, dyes, or preservatives Pregnant or  trying to get pregnant Breast-feeding How should I use this medication? This medication is infused into a vein. It is given in a hospital or clinic setting. A special MedGuide will be given to you before each treatment. Be sure to read this information carefully each time. Talk to your care team about the use of this medication in children. While it may be prescribed for children as young as 12 years for selected conditions, precautions do apply. Overdosage: If you think you have taken too much of this medicine contact a poison control center or emergency room at once. NOTE: This medicine is only for you. Do not share this medicine with others. What if I miss a dose? Keep appointments for follow-up doses. It is important not to miss your dose. Call your care team if you are unable to keep an appointment. What may interact with this medication? Interactions have not been studied. This list may not describe all possible interactions. Give your health care provider a list of all the medicines, herbs, non-prescription drugs, or dietary supplements you use. Also tell them if you smoke, drink alcohol, or use illegal drugs. Some items may interact with your medicine. What should I watch for while using this medication? Your condition will be monitored carefully while you are receiving this medication. You may need blood work while taking this medication. This medication may cause serious skin reactions. They can happen weeks to months after starting the medication. Contact your care team right away if you notice fevers or flu-like symptoms with a rash. The rash may be red or purple and then turn into blisters or peeling of  the skin. You may also notice a red rash with swelling of the face, lips, or lymph nodes in your neck or under your arms. Tell your care team right away if you have any change in your eyesight. Talk to your care team if you are pregnant or think you might be pregnant. A negative  pregnancy test is required before starting this medication. A reliable form of contraception is recommended while taking this medication and for 5 months after the last dose. Talk to your care team about effective forms of contraception. Do not breast-feed while taking this medication and for 5 months after the last dose. What side effects may I notice from receiving this medication? Side effects that you should report to your care team as soon as possible: Allergic reactions--skin rash, itching, hives, swelling of the face, lips, tongue, or throat Dry cough, shortness of breath or trouble breathing Eye pain, redness, irritation, or discharge with blurry or decreased vision Heart muscle inflammation--unusual weakness or fatigue, shortness of breath, chest pain, fast or irregular heartbeat, dizziness, swelling of the ankles, feet, or hands Hormone gland problems--headache, sensitivity to light, unusual weakness or fatigue, dizziness, fast or irregular heartbeat, increased sensitivity to cold or heat, excessive sweating, constipation, hair loss, increased thirst or amount of urine, tremors or shaking, irritability Infusion reactions--chest pain, shortness of breath or trouble breathing, feeling faint or lightheaded Kidney injury (glomerulonephritis)--decrease in the amount of urine, red or dark brown urine, foamy or bubbly urine, swelling of the ankles, hands, or feet Liver injury--right upper belly pain, loss of appetite, nausea, light-colored stool, dark yellow or brown urine, yellowing skin or eyes, unusual weakness or fatigue Pain, tingling, or numbness in the hands or feet, muscle weakness, change in vision, confusion or trouble speaking, loss of balance or coordination, trouble walking, seizures Rash, fever, and swollen lymph nodes Redness, blistering, peeling, or loosening of the skin, including inside the mouth Sudden or severe stomach pain, bloody diarrhea, fever, nausea, vomiting Side effects  that usually do not require medical attention (report these to your care team if they continue or are bothersome): Bone, joint, or muscle pain Diarrhea Fatigue Loss of appetite Nausea Skin rash This list may not describe all possible side effects. Call your doctor for medical advice about side effects. You may report side effects to FDA at 1-800-FDA-1088. Where should I keep my medication? This medication is given in a hospital or clinic. It will not be stored at home. NOTE: This sheet is a summary. It may not cover all possible information. If you have questions about this medicine, talk to your doctor, pharmacist, or health care provider.  2023 Elsevier/Gold Standard (2022-03-07 00:00:00)   BELOW ARE SYMPTOMS THAT SHOULD BE REPORTED IMMEDIATELY: *FEVER GREATER THAN 100.4 F (38 C) OR HIGHER *CHILLS OR SWEATING *NAUSEA AND VOMITING THAT IS NOT CONTROLLED WITH YOUR NAUSEA MEDICATION *UNUSUAL SHORTNESS OF BREATH *UNUSUAL BRUISING OR BLEEDING *URINARY PROBLEMS (pain or burning when urinating, or frequent urination) *BOWEL PROBLEMS (unusual diarrhea, constipation, pain near the anus) TENDERNESS IN MOUTH AND THROAT WITH OR WITHOUT PRESENCE OF ULCERS (sore throat, sores in mouth, or a toothache) UNUSUAL RASH, SWELLING OR PAIN  UNUSUAL VAGINAL DISCHARGE OR ITCHING   Items with * indicate a potential emergency and should be followed up as soon as possible or go to the Emergency Department if any problems should occur.  Please show the CHEMOTHERAPY ALERT CARD or IMMUNOTHERAPY ALERT CARD at check-in to the Emergency Department and triage nurse.  Should you  have questions after your visit or need to cancel or reschedule your appointment, please contact [facility] [phone]  and follow the prompts.  Office hours are 8:00 a.m. to 4:30 p.m. Monday - Friday. Please note that voicemails left after 4:00 p.m. may not be returned until the following business day.  We are closed  weekends and major holidays. You have access to a nurse at all times for urgent questions. Please call the main number to the clinic [phone] and follow the prompts.  For any non-urgent questions, you may also contact your provider using MyChart. We now offer e-Visits for anyone 56 and older to request care online for non-urgent symptoms. For details visit [email or web address].   Also download the MyChart app! Go to the app store, search "MyChart", open the app, select [facility], and log in with your MyChart username and password.

## 2022-12-29 NOTE — Progress Notes (Signed)
Pt presents today for Opdivo per provider's order. Vital signs and labs WNL for treatment. Okay to proceed with treatment today per Dr.K.  Opdivo given today per MD orders. Tolerated infusion without adverse affects. Vital signs stable. No complaints at this time. Discharged from clinic ambulatory in stable condition. Alert and oriented x 3. F/U with [facility] as scheduled.

## 2022-12-29 NOTE — Progress Notes (Signed)
Patient has been examined by Dr. Katragadda, and vital signs and labs have been reviewed. ANC, Creatinine, LFTs, hemoglobin, and platelets are within treatment parameters per M.D. - pt may proceed with treatment.  Primary RN and pharmacy notified.  

## 2022-12-29 NOTE — Progress Notes (Signed)
[facility] [id number], [place] [address]   CLINIC:  Medical Oncology/Hematology  PCP:  [name], MD [address] [place] [address] [phone]   REASON FOR VISIT:  Follow-up for metastatic right lung cancer  PRIOR THERAPY:  1. Bevacizumab, cisplatin and pemetrexed x 4 cycles from 09/02/2014 to 11/04/2014. 2. Bevacizumab and pemetrexed x 3 cycles from 12/24/2014 to 02/04/2015.  NGS Results: not done  CURRENT THERAPY: Nivolumab every 4 weeks  BRIEF ONCOLOGIC HISTORY:  Oncology History  Cancer of upper lobe of right lung ([place])  07/28/2014 Imaging   CT chest: Large R apical mass consistent with malignancy. This is destroying the R 2nd rib with extension into adjacent soft tissue. R hilar adenopathy with R 5cm adrenal metastatic lesion.   08/01/2014 Initial Biopsy   Lung, needle/core biopsy(ies), right upper lobe - POORLY DIFFERENTIATED ADENOCARCINOMA, SEE COMMENT.   08/08/2014 PET scan   Large hypermetabolic R apical mass with evidence of direct chest wall and mediastinal invasion, right retrocrural lymphadenopathy, extensive retroperitoneal lymphadenopathy, and metastatic lesions to the adrenal glands    09/02/2014 - 11/04/2014 Chemotherapy   Cisplatin/Pemetrexed/Avastin every 21 days x 4 cycles   10/07/2014 - 10/27/2014 Radiation Therapy   Right lung apex for control of brachioplexopathy.   12/24/2014 - 02/25/2015 Chemotherapy   Alimta/Avastin every 21 days.   02/20/2015 Imaging   Increase in size of right adrenal metastasis and subjacent confluent retrocaval lymphadenopathy   02/25/2015 -  Chemotherapy   Nivolumab, zometa   05/04/2015 Imaging   CT CAP- Stable to slight decrease in the posterior right apical lesion. Stable appearance of posterior right upper rib an upper thoracic bony lesions. Slight improvement in right upper lobe tree-in-bud opacity. No new or progressive findings in...   07/28/2015 Imaging   CT CAP- Reduced size of the  right apical pleural parenchymal lesion and reduced size of the right adrenal metastatic lesion. Resolution of prior retrocrural adenopathy.  Right eccentric T1 and T2 sclerosis with sclerosis and tapering of the right second..   11/17/2015 Imaging   CT CAP- Stable soft tissue thickening in the apex of the right hemi thorax. Stable right adrenal metastasis. Nodularity along the trachea and mainstem bronchi, relatively new from 07/28/2015, favoring adherent debris.   11/18/2015 Treatment Plan Change   Zometa HELD for upcoming tooth extraction   11/24/2015 Treatment Plan Change   Zometa on hold at this time in preparation for tooth extraction in March 2017.  Zometa las given on 11/18/2015.   02/03/2016 Imaging   CT CAP- Heterogeneous right apical masslike consolidation and right adrenal metastasis are unchanged   04/06/2016 Treatment Plan Change   Zometa restarted 6 weeks out from tooth extraction (04/06/2016)   05/12/2016 Imaging   CT CAP- NED in the chest, abdomen or pelvis.  Some areas of nodularity associated with the mainstem bronchi in the left upper lobe bronchus, favored to represent adherent inspissated secretions   08/17/2016 Imaging   CT CAP- 1. Stable CTs of the chest and abdomen. No evidence of progressive metastatic disease. 2. Probable treated tumor at the right apex, right adrenal gland and T2 vertebral body, stable. 3. Fluctuating nodularity along the walls of the trachea and mainstem bronchi, likely secretions.   12/12/2016 Imaging   Further decrease in size of treated tumor within the right apex. 2. Stable treated tumor involving the right second rib and T2 vertebra. 3. Stable right adrenal gland treated tumor. 4. Emphysema 5. Aortic atherosclerosis  01/04/2017 Imaging   MRI brain- Normal brain MRI.  No intracranial metastatic disease.   03/15/2017 Imaging   CT CAP- 1. No new or progressive metastatic disease in the chest or abdomen. 2. Stable treated tumor in the  apical right upper lobe. Stable treated right posterior second rib and right T2 vertebral lesions. Stable treated right adrenal metastasis. 3. Aortic atherosclerosis. 4. Moderate emphysema with mild diffuse bronchial wall thickening, suggesting COPD.   06/12/2017 Imaging   CT CAP 1. Similar appearance of treated primary within the right apex. 2. Similar areas of sclerosis within the right second rib, T2, and less so T1 vertebral bodies. These are most consistent with treated metastasis. 3. Similar right adrenal treated metastasis. 4. No evidence of new or progressive disease. 5. Similar right and progressive left areas of bronchial wall thickening and probable mucoid impaction. Correlate with interval infectious symptoms. 6.  Emphysema (ICD10-J43.9). 7. Coronary artery atherosclerosis. Aortic Atherosclerosis (ICD10-I70.0).     10/16/2017 Imaging   CT CAP 1. Stable appearance of the prior Pancoast tumor and related bony findings in the right second rib and right T1 and T2 vertebra compatible with successfully treated tumor. No significant enlargement or new lesions are identified. Similarly the treated right adrenal metastatic lesion is stable in appearance. 2. Upper normal size right hilar lymph node may warrant surveillance. Currently 9 mm in short axis. 3. Other imaging findings of potential clinical significance: Aortic Atherosclerosis (ICD10-I70.0) and Emphysema (ICD10-J43.9). Scattered proximal sigmoid colon diverticula. Mucus plugging medially in the left upper lobe and posteriorly in the right upper lobe.     11/29/2017 - 06/16/2022 Chemotherapy   Patient is on Treatment Plan : LUNG Nivolumab q28d     07/14/2022 -  Chemotherapy   Patient is on Treatment Plan : LUNG Nivolumab (480) q28d       CANCER STAGING:  Cancer Staging  No matching staging information was found for the patient.  INTERVAL HISTORY:  Melissa Sullivan, a 67 y.o. female, seen for follow-up of  metastatic lung cancer. She was last seen by me on 10/06/22.  Today, she states that she is doing very well overall. Her appetite level is at 100%. Her energy level is at 100%. She denies any dry cough, N/V/D, or skin rashes.   REVIEW OF SYSTEMS:  Review of Systems  Constitutional:  Negative for chills, fatigue and fever.  HENT:   Negative for lump/mass, mouth sores, nosebleeds, sore throat and trouble swallowing.   Eyes:  Negative for eye problems.  Respiratory:  Negative for cough.   Cardiovascular:  Negative for chest pain, leg swelling and palpitations.  Gastrointestinal:  Negative for abdominal pain, constipation, diarrhea, nausea and vomiting.  Genitourinary:  Negative for bladder incontinence, difficulty urinating, dysuria, frequency, hematuria and nocturia.   Musculoskeletal:  Negative for arthralgias, back pain, flank pain, myalgias and neck pain.  Skin:  Negative for itching and rash.  Neurological:  Negative for dizziness, headaches and numbness.  Hematological:  Does not bruise/bleed easily.  Psychiatric/Behavioral:  Negative for depression, sleep disturbance and suicidal ideas. The patient is not nervous/anxious.   All other systems reviewed and are negative.   PAST MEDICAL/SURGICAL HISTORY:  Past Medical History:  Diagnosis Date   Adrenal mass, right ([place]) 07/28/2014   Anemia    Bone metastases 04/05/2016   Cancer ([place]) 2015   lung  right   Diabetes mellitus without complication (HCC)    GERD (gastroesophageal reflux disease)    Hyperlipidemia    Hypertension  Hypothyroidism due to medication 01/30/2017   Lung mass 07/28/2014   Reflux    Past Surgical History:  Procedure Laterality Date   AMPUTATION Right 02/22/2017   Procedure: PARTIAL AMPUTATION RIGHT GREAT TOE;  Surgeon: Caprice Beaver, DPM;  Location: [facility];  Service: Podiatry;  Laterality: Right;   APPENDECTOMY     BIOPSY  09/22/2020   Procedure: BIOPSY;  Surgeon: Eloise Harman, DO;  Location: [facility];  Service: Endoscopy;;   COLONOSCOPY WITH PROPOFOL N/A 09/22/2020   non-bleeding internal hemorrhoids, sigmoid and descending colon diverticulosis, localized mild inflammation in sigmoid that was felt due to prep artifact.    ESOPHAGOGASTRODUODENOSCOPY N/A 12/09/2014   KXF:GHWEXH esophageal stricture/mild-to-noderate erosive gastritis. negative H.pylori   FLEXIBLE SIGMOIDOSCOPY  2011   Dr. [name]: hyperplastic polyp   LUNG BIOPSY Right 07/2014   CT guided   MALONEY DILATION N/A 12/09/2014   Procedure: MALONEY DILATION;  Surgeon: [name], MD;  Location: [facility];  Service: Endoscopy;  Laterality: N/A;   PORTACATH PLACEMENT Left 09/01/2014   SAVORY DILATION N/A 12/09/2014   Procedure: SAVORY DILATION;  Surgeon: [name], MD;  Location: [facility];  Service: Endoscopy;  Laterality: N/A;    SOCIAL HISTORY:  Social History   Socioeconomic History   Marital status: Married    Spouse name: Not on file   Number of children: Not on file   Years of education: Not on file   Highest education level: Not on file  Occupational History   Not on file  Tobacco Use   Smoking status: Light Smoker    Packs/day: 0.30    Years: 20.00    Total pack years: 6.00    Types: Cigarettes   Smokeless tobacco: Never  Vaping Use   Vaping Use: Never used  Substance and Sexual Activity   Alcohol use: No   Drug use: No   Sexual activity: Not on file  Other Topics Concern   Not on file  Social History Narrative   Not on file   Social Determinants of Health   Financial Resource Strain: Low Risk  (12/03/2020)   Overall Financial Resource Strain (CARDIA)    Difficulty of Paying Living Expenses: Not hard at all  Food Insecurity: No Food Insecurity (12/03/2020)   Hunger Vital Sign    Worried About Running Out of Food in the Last Year: Never true    Ran Out of Food in the Last Year: Never true  Transportation Needs: No Transportation Needs (12/03/2020)   PRAPARE - Armed forces logistics/support/administrative officer (Medical): No    Lack of Transportation (Non-Medical): No  Physical Activity: Not on file  Stress: No Stress Concern Present (12/03/2020)   [place]    Feeling of Stress : Only a little  Social Connections: Moderately Isolated (12/03/2020)   Social Connection and Isolation Panel [NHANES]    Frequency of Communication with Friends and Family: More than three times a week    Frequency of Social Gatherings with Friends and Family: More than three times a week    Attends Religious Services: Never    Marine scientist or Organizations: No    Attends Archivist Meetings: Never    Marital Status: Married  Human resources officer Violence: Not At Risk (12/03/2020)   Humiliation, Afraid, Rape, and Kick questionnaire    Fear of Current or Ex-Partner: No    Emotionally Abused: No  Physically Abused: No    Sexually Abused: No    FAMILY HISTORY:  Family History  Problem Relation Age of Onset   Cancer Sister    Colon cancer Neg Hx     CURRENT MEDICATIONS:  Current Outpatient Medications  Medication Sig Dispense Refill   albuterol (VENTOLIN HFA) 108 (90 Base) MCG/ACT inhaler Inhale 2 puffs into the lungs every 6 (six) hours as needed for wheezing or shortness of breath.     dronabinol (MARINOL) 2.5 MG capsule Take 1 capsule (2.5 mg total) by mouth 2 (two) times daily before lunch and supper. 60 capsule 5   ENSURE (ENSURE) Take 1 Can by mouth 4 (four) times daily.     Ferrous Sulfate (IRON) 325 (65 Fe) MG TABS Take 325 mg by mouth daily.      fluticasone (FLONASE) 50 MCG/ACT nasal spray fluticasone propionate 50 mcg/actuation nasal spray,suspension     fluticasone (FLONASE) 50 MCG/ACT nasal spray Place into both nostrils.     fluticasone-salmeterol (ADVAIR DISKUS) 100-50 MCG/ACT AEPB INHALE 1 DOSE BY MOUTH TWICE DAILY     Fluticasone-Salmeterol (ADVAIR DISKUS) 500-50 MCG/DOSE AEPB Inhale 1 puff  into the lungs 2 (two) times daily. 60 each 6   levocetirizine (XYZAL) 5 MG tablet Take 5 mg by mouth every evening.      levothyroxine (SYNTHROID) 25 MCG tablet TAKE 1/2 (ONE-HALF) TABLET BY MOUTH ONCE DAILY BEFORE BREAKFAST 45 tablet 0   lidocaine-prilocaine (EMLA) cream Apply 1 Application topically as needed (port access). 30 g 3   naloxone (NARCAN) nasal spray 4 mg/0.1 mL      Nivolumab (OPDIVO IV) Inject into the vein every 28 (twenty-eight) days.     omeprazole (PRILOSEC) 40 MG capsule Take 1 capsule (40 mg total) by mouth daily. 30 capsule 11   Oxycodone HCl 10 MG TABS Take 1 tablet (10 mg total) by mouth every 12 (twelve) hours as needed (pain.). 60 tablet 0   promethazine (PHENERGAN) 6.25 MG/5ML syrup every 4 (four) hours as needed.     pseudoephedrine-guaifenesin (MUCINEX D) 60-600 MG 12 hr tablet Take 1 tablet by mouth daily as needed for congestion.     rosuvastatin (CRESTOR) 20 MG tablet Take 1 tablet by mouth daily.     rosuvastatin (CRESTOR) 20 MG tablet Take 20 mg by mouth daily.     simvastatin (ZOCOR) 40 MG tablet simvastatin 40 mg tablet  TAKE 1 TABLET BY MOUTH ONCE DAILY     No current facility-administered medications for this visit.   Facility-Administered Medications Ordered in Other Visits  Medication Dose Route Frequency Provider Last Rate Last Admin   sodium chloride flush (NS) 0.9 % injection 10 mL  10 mL Intracatheter PRN Derek Jack, MD   10 mL at 01/03/19 0930    ALLERGIES:  Allergies  Allergen Reactions   Xgeva [Denosumab]     osteonecrosis    PHYSICAL EXAM:  Performance status (ECOG): 0 - Asymptomatic  There were no vitals filed for this visit.  Wt Readings from Last 3 Encounters:  12/02/22 45.3 kg (99 lb 12.8 oz)  11/03/22 43.7 kg (96 lb 4.8 oz)  09/08/22 44.1 kg (97 lb 3.2 oz)   Physical Exam Vitals reviewed. Exam conducted with a chaperone present.  Constitutional:      Appearance: Normal appearance.  Cardiovascular:     Rate  and Rhythm: Normal rate and regular rhythm.     Pulses: Normal pulses.     Heart sounds: Normal heart sounds.  Pulmonary:  Effort: Pulmonary effort is normal.     Breath sounds: Normal breath sounds.  Abdominal:     Palpations: Abdomen is soft. There is no hepatomegaly, splenomegaly or mass.     Tenderness: There is no abdominal tenderness.  Lymphadenopathy:     Upper Body:     Right upper body: No supraclavicular, axillary or pectoral adenopathy.     Left upper body: No supraclavicular, axillary or pectoral adenopathy.  Neurological:     General: No focal deficit present.     Mental Status: She is alert and oriented to person, place, and time.  Psychiatric:        Mood and Affect: Mood normal.        Behavior: Behavior normal.     LABORATORY DATA:  I have reviewed the labs as listed.     Latest Ref Rng & Units 12/02/2022    9:00 AM 11/03/2022    9:54 AM 10/06/2022    9:49 AM  CBC  WBC 4.0 - 10.5 K/uL 9.7  8.0  9.2   Hemoglobin 12.0 - 15.0 g/dL 13.5  14.3  14.2   Hematocrit 36.0 - 46.0 % 41.0  44.0  43.0   Platelets 150 - 400 K/uL 251  265  259       Latest Ref Rng & Units 12/02/2022    9:00 AM 11/03/2022    9:54 AM 10/06/2022    9:49 AM  CMP  Glucose 70 - 99 mg/dL 99  98  107   BUN 8 - 23 mg/dL 13  10  12    Creatinine 0.44 - 1.00 mg/dL 1.13  1.11  1.19   Sodium 135 - 145 mmol/L 137  135  137   Potassium 3.5 - 5.1 mmol/L 3.8  3.7  3.9   Chloride 98 - 111 mmol/L 103  104  106   CO2 22 - 32 mmol/L 25  24  26    Calcium 8.9 - 10.3 mg/dL 9.1  9.1  9.1   Total Protein 6.5 - 8.1 g/dL 7.4  8.0  7.4   Total Bilirubin 0.3 - 1.2 mg/dL 0.4  0.5  0.3   Alkaline Phos 38 - 126 U/L 63  62  64   AST 15 - 41 U/L 22  21  20    ALT 0 - 44 U/L 14  14  16      DIAGNOSTIC IMAGING:  I have independently reviewed the scans and discussed with the patient. CT CHEST ABDOMEN PELVIS W CONTRAST  Result Date: 12/22/2022 CLINICAL DATA:  Follow-up RIGHT lung carcinoma. Metastasis. Patient  status post radiation therapy chemotherapy. History of adrenal metastasis and bone metastasis. * Tracking Code: BO *. EXAM: CT CHEST, ABDOMEN, AND PELVIS WITH CONTRAST TECHNIQUE: Multidetector CT imaging of the chest, abdomen and pelvis was performed following the standard protocol during bolus administration of intravenous contrast. RADIATION DOSE REDUCTION: This exam was performed according to the departmental dose-optimization program which includes automated exposure control, adjustment of the mA and/or kV according to patient size and/or use of iterative reconstruction technique. CONTRAST:  120mL OMNIPAQUE IOHEXOL 300 MG/ML  SOLN COMPARISON:  None Available. FINDINGS: CT CHEST FINDINGS Cardiovascular: No significant vascular findings. Normal heart size. No pericardial effusion. Mediastinum/Nodes: No axillary or supraclavicular adenopathy. No mediastinal or hilar adenopathy. No pericardial fluid. Esophagus normal. Port in the anterior chest wall with tip in distal SVC. Lungs/Pleura: RIGHT apical pleural thickening and calcifications unchanged from comparison exam. Centrilobular emphysema the upper lobes. No new or suspicious pulmonary  nodules. Musculoskeletal: No aggressive osseous lesion. CT ABDOMEN AND PELVIS FINDINGS Hepatobiliary: No focal hepatic lesion. No biliary ductal dilatation. Gallbladder is normal. Common bile duct is normal. Pancreas: Pancreas is normal. No ductal dilatation. No pancreatic inflammation. Spleen: Normal spleen Adrenals/urinary tract: Lobular calcifications of the RIGHT adrenal gland unchanged. No enhancing renal cortical lesion. Ureters and bladder normal. Stomach/Bowel: Stomach, small bowel, appendix, and cecum are normal. The colon and rectosigmoid colon are normal. Multiple diverticula of the descending colon and sigmoid colon without acute inflammation. Vascular/Lymphatic: Abdominal aorta is normal caliber with atherosclerotic calcification. There is no retroperitoneal or  periportal lymphadenopathy. No pelvic lymphadenopathy. Reproductive: Uterus and adnexa unremarkable. Other: No peritoneal metastasis.  No peritoneal fluid. Musculoskeletal: No aggressive osseous lesion. IMPRESSION: CHEST IMPRESSION: 1. No evidence of lung cancer recurrence or metastasis. 2. Stable pleural thickening and calcification at the RIGHT lung apex. PELVIS IMPRESSION: 1. No evidence of metastatic disease in the abdomen pelvis. 2. Stable lobular calcification RIGHT adrenal gland. 3. No skeletal metastasis. 4. Aortic Atherosclerosis (ICD10-I70.0) and Emphysema (ICD10-J43.9). Electronically Signed   By: Suzy Bouchard M.D.   On: 12/22/2022 16:51     ASSESSMENT:  1. Cancer of upper lobe of right lung [facility]) Metastatic adenocarcinoma of the lung to the bones and right adrenal: -Foundation 1 testing shows 14 genomic alterations, no approved therapies. -Opdivo 480 mg monthly started on February 25, 2015. -CT CAP on February 21, 2020 shows no evidence of progression or metastatic disease.  T2 sclerotic lesion is stable. -CTAP on 08/10/2020 with no evidence of recurrent metastatic disease in the abdomen or pelvis.  Irregular mildly enlarged right adrenal gland unchanged. -CT chest on 09/09/2020 showed partial clearing of the airspace opacities noted medially in the lingula and left lower lobe.  New irregular nodule posteriorly at the right apex likely inflammatory.  Stable sclerotic T2 vertebral body lesion. -Reviewed CT CAP from 02/19/2021 which showed stable to slight decrease in size of mediastinal lymph nodes.  Stable posttreatment changes in the left lung apex and slight decrease in size of the absent irregular nodule.  Stable sclerotic lesion in T2 vertebral body.  No new progressive findings.    PLAN:  1.  Metastatic lung adenocarcinoma: - She is tolerating monthly Opdivo very well.  No immunotherapy related side effects. - CT CAP (12/22/2022): No evidence of lung cancer recurrence.  Stable pleural  thickening and calcification in the right lung apex.  No skeletal metastasis.  No evidence of metastatic disease in the abdomen or pelvis.  Stable lobular calcifications in the right adrenal gland. - We have discussed possibility of holding opdivo as she did not have any evidence of disease in the last few scans.  She is agreeable.  We will proceed with her last treatment of Opdivo today. - We will schedule her for 40-month visit with CT CAP.  We will continue to monitor closely until progression.   2.  High risk drug monitoring: - Continue Synthroid 25 mcg daily.  TSH is 2.4.   3.  Health maintenance: - Last mammogram on 07/18/2022 was BI-RADS Category 1.   4.  CKD: - Creatinine is 1.07.  Baseline creatinine is between 1.1-1.3.   5.  Bone metastasis: - Bisphosphonates on hold due to ONJ in 2019.  6.  Chronic pains: - Continue oxycodone as needed.  7.  Weight loss: - Continue Marinol 2.5 mg twice daily.  Weight is stable.   Orders placed this encounter:  No orders of the defined types were placed in  this encounter.    I,Alexis Herring,acting as a Education administrator for [employer], MD.,have documented all relevant documentation on the behalf of Derek Jack, MD,as directed by  Derek Jack, MD while in the presence of Derek Jack, MD.  I, Derek Jack MD, have reviewed the above documentation for accuracy and completeness, and I agree with the above.   Derek Jack, MD [place] [phone]

## 2022-12-29 NOTE — Patient Instructions (Signed)
[place] at [facility] Discharge Instructions   You were seen and examined today by Dr. Delton Coombes.  He reviewed the results of your CT scan which is normal.   He reviewed the results of your lab work which were normal/stable.   He discussed with you stopping treatments and giving you a break from immunotherapy because your scans over the years have been normal. You've decided to have treatment today and then take a break for three months until we get your next scan.   Return as scheduled.    Thank you for choosing [place] at [facility] to provide your oncology and hematology care.  To afford each patient quality time with our provider, please arrive at least 15 minutes before your scheduled appointment time.   If you have a lab appointment with the [facility] please come in thru the Main Entrance and check in at the main information desk.  You need to re-schedule your appointment should you arrive 10 or more minutes late.  We strive to give you quality time with our providers, and arriving late affects you and other patients whose appointments are after yours.  Also, if you no show three or more times for appointments you may be dismissed from the clinic at the providers discretion.     Again, thank you for choosing [facility].  Our hope is that these requests will decrease the amount of time that you wait before being seen by our physicians.       _____________________________________________________________  Should you have questions after your visit to [facility], please contact our office at [phone] and follow the prompts.  Our office hours are 8:00 a.m. and 4:30 p.m. Monday - Friday.  Please note that voicemails left after 4:00 p.m. may not be returned until the following business day.  We are closed weekends and major holidays.  You do have access to a nurse 24-7, just call the main number to the  clinic [phone] and do not press any options, hold on the line and a nurse will answer the phone.    For prescription refill requests, have your pharmacy contact our office and allow 72 hours.    Due to Covid, you will need to wear a mask upon entering the hospital. If you do not have a mask, a mask will be given to you at the Main Entrance upon arrival. For doctor visits, patients may have 1 support person age 25 or older with them. For treatment visits, patients can not have anyone with them due to social distancing guidelines and our immunocompromised population.

## 2023-01-26 ENCOUNTER — Inpatient Hospital Stay: Payer: Medicare HMO

## 2023-01-26 ENCOUNTER — Other Ambulatory Visit: Payer: Self-pay | Admitting: Hematology

## 2023-01-26 ENCOUNTER — Ambulatory Visit: Payer: Medicare HMO | Admitting: Hematology

## 2023-01-26 ENCOUNTER — Other Ambulatory Visit: Payer: Medicare HMO

## 2023-01-26 DIAGNOSIS — E032 Hypothyroidism due to medicaments and other exogenous substances: Secondary | ICD-10-CM

## 2023-01-31 DIAGNOSIS — Z0100 Encounter for examination of eyes and vision without abnormal findings: Secondary | ICD-10-CM | POA: Diagnosis not present

## 2023-01-31 DIAGNOSIS — H524 Presbyopia: Secondary | ICD-10-CM | POA: Diagnosis not present

## 2023-02-23 ENCOUNTER — Other Ambulatory Visit: Payer: Medicare HMO

## 2023-02-23 ENCOUNTER — Inpatient Hospital Stay: Payer: Medicare HMO

## 2023-02-23 ENCOUNTER — Ambulatory Visit: Payer: Medicare HMO | Admitting: Hematology

## 2023-02-23 DIAGNOSIS — G629 Polyneuropathy, unspecified: Secondary | ICD-10-CM | POA: Diagnosis not present

## 2023-02-23 DIAGNOSIS — B351 Tinea unguium: Secondary | ICD-10-CM | POA: Diagnosis not present

## 2023-02-23 DIAGNOSIS — C8490 Mature T/NK-cell lymphomas, unspecified, unspecified site: Secondary | ICD-10-CM | POA: Diagnosis not present

## 2023-03-22 ENCOUNTER — Other Ambulatory Visit: Payer: Self-pay

## 2023-03-22 DIAGNOSIS — C3411 Malignant neoplasm of upper lobe, right bronchus or lung: Secondary | ICD-10-CM

## 2023-03-23 ENCOUNTER — Inpatient Hospital Stay: Payer: Medicare HMO | Attending: Hematology

## 2023-03-23 ENCOUNTER — Ambulatory Visit (HOSPITAL_COMMUNITY)
Admission: RE | Admit: 2023-03-23 | Discharge: 2023-03-23 | Disposition: A | Discharge status: Home / Self Care | Payer: Medicare HMO | Source: Ambulatory Visit | Attending: Hematology | Admitting: Hematology

## 2023-03-23 DIAGNOSIS — E039 Hypothyroidism, unspecified: Secondary | ICD-10-CM | POA: Insufficient documentation

## 2023-03-23 DIAGNOSIS — R634 Abnormal weight loss: Secondary | ICD-10-CM | POA: Diagnosis not present

## 2023-03-23 DIAGNOSIS — J432 Centrilobular emphysema: Secondary | ICD-10-CM | POA: Diagnosis not present

## 2023-03-23 DIAGNOSIS — Z79890 Hormone replacement therapy (postmenopausal): Secondary | ICD-10-CM | POA: Insufficient documentation

## 2023-03-23 DIAGNOSIS — C3411 Malignant neoplasm of upper lobe, right bronchus or lung: Secondary | ICD-10-CM

## 2023-03-23 DIAGNOSIS — N189 Chronic kidney disease, unspecified: Secondary | ICD-10-CM | POA: Insufficient documentation

## 2023-03-23 DIAGNOSIS — Z79899 Other long term (current) drug therapy: Secondary | ICD-10-CM | POA: Insufficient documentation

## 2023-03-23 DIAGNOSIS — Z79891 Long term (current) use of opiate analgesic: Secondary | ICD-10-CM | POA: Insufficient documentation

## 2023-03-23 DIAGNOSIS — Z9221 Personal history of antineoplastic chemotherapy: Secondary | ICD-10-CM | POA: Diagnosis not present

## 2023-03-23 DIAGNOSIS — Z923 Personal history of irradiation: Secondary | ICD-10-CM | POA: Insufficient documentation

## 2023-03-23 DIAGNOSIS — C7951 Secondary malignant neoplasm of bone: Secondary | ICD-10-CM | POA: Diagnosis not present

## 2023-03-23 DIAGNOSIS — G8929 Other chronic pain: Secondary | ICD-10-CM | POA: Insufficient documentation

## 2023-03-23 DIAGNOSIS — F17210 Nicotine dependence, cigarettes, uncomplicated: Secondary | ICD-10-CM | POA: Insufficient documentation

## 2023-03-23 LAB — CBC WITH DIFFERENTIAL/PLATELET
Abs Immature Granulocytes: 0.04 10*3/uL (ref 0.00–0.07)
Basophils Absolute: 0 10*3/uL (ref 0.0–0.1)
Basophils Relative: 0 %
Eosinophils Absolute: 0 10*3/uL (ref 0.0–0.5)
Eosinophils Relative: 0 %
HCT: 43.1 % (ref 36.0–46.0)
Hemoglobin: 14.3 g/dL (ref 12.0–15.0)
Immature Granulocytes: 0 %
Lymphocytes Relative: 23 %
Lymphs Abs: 2.3 10*3/uL (ref 0.7–4.0)
MCH: 31.2 pg (ref 26.0–34.0)
MCHC: 33.2 g/dL (ref 30.0–36.0)
MCV: 93.9 fL (ref 80.0–100.0)
Monocytes Absolute: 0.7 10*3/uL (ref 0.1–1.0)
Monocytes Relative: 7 %
Neutro Abs: 6.7 10*3/uL (ref 1.7–7.7)
Neutrophils Relative %: 70 %
Platelets: 254 10*3/uL (ref 150–400)
RBC: 4.59 MIL/uL (ref 3.87–5.11)
RDW: 15.6 % — ABNORMAL HIGH (ref 11.5–15.5)
WBC: 9.8 10*3/uL (ref 4.0–10.5)
nRBC: 0 % (ref 0.0–0.2)

## 2023-03-23 LAB — COMPREHENSIVE METABOLIC PANEL
ALT: 21 U/L (ref 0–44)
AST: 30 U/L (ref 15–41)
Albumin: 4.2 g/dL (ref 3.5–5.0)
Alkaline Phosphatase: 72 U/L (ref 38–126)
Anion gap: 11 (ref 5–15)
BUN: 13 mg/dL (ref 8–23)
CO2: 24 mmol/L (ref 22–32)
Calcium: 8.9 mg/dL (ref 8.9–10.3)
Chloride: 102 mmol/L (ref 98–111)
Creatinine, Ser: 1.23 mg/dL — ABNORMAL HIGH (ref 0.44–1.00)
GFR, Estimated: 48 mL/min — ABNORMAL LOW (ref >60)
Glucose, Bld: 107 mg/dL — ABNORMAL HIGH (ref 70–99)
Potassium: 4 mmol/L (ref 3.5–5.1)
Sodium: 137 mmol/L (ref 135–145)
Total Bilirubin: 1.1 mg/dL (ref 0.3–1.2)
Total Protein: 7.7 g/dL (ref 6.5–8.1)

## 2023-03-23 LAB — TSH: TSH: 2.315 u[IU]/mL (ref 0.350–4.500)

## 2023-03-23 MED ORDER — IOHEXOL 300 MG/ML  SOLN
75.0000 mL | Freq: Once | INTRAMUSCULAR | Status: AC | PRN
  Administered 2023-03-23: 75 mL via INTRAVENOUS

## 2023-03-23 MED ORDER — IOHEXOL 9 MG/ML PO SOLN
ORAL | Status: AC
  Filled 2023-03-23: qty 500

## 2023-03-28 ENCOUNTER — Other Ambulatory Visit: Payer: Self-pay

## 2023-03-28 DIAGNOSIS — R630 Anorexia: Secondary | ICD-10-CM

## 2023-03-28 DIAGNOSIS — C3411 Malignant neoplasm of upper lobe, right bronchus or lung: Secondary | ICD-10-CM

## 2023-03-28 MED ORDER — DRONABINOL 2.5 MG PO CAPS: 2.5000 mg | ORAL_CAPSULE | Freq: Two times a day (BID) | ORAL | 5 refills | Status: DC

## 2023-03-29 NOTE — Progress Notes (Signed)
[facility] [address] [place] [address]    Clinic Day:  03/30/2023  Referring physician: Benita Stabile, MD  Patient Care Team: Benita Stabile, MD as PCP - General (Internal Medicine) West Bali, MD (Inactive) as Consulting Physician (Gastroenterology)   ASSESSMENT & PLAN:   Assessment: 1. Cancer of upper lobe of right lung [facility]) Metastatic adenocarcinoma of the lung to the bones and right adrenal: -Foundation 1 testing shows 14 genomic alterations, no approved therapies. -Opdivo 480 mg monthly started on February 25, 2015. -CT CAP on February 21, 2020 shows no evidence of progression or metastatic disease.  T2 sclerotic lesion is stable. -CTAP on 08/10/2020 with no evidence of recurrent metastatic disease in the abdomen or pelvis.  Irregular mildly enlarged right adrenal gland unchanged. -CT chest on 09/09/2020 showed partial clearing of the airspace opacities noted medially in the lingula and left lower lobe.  New irregular nodule posteriorly at the right apex likely inflammatory.  Stable sclerotic T2 vertebral body lesion. -Reviewed CT CAP from 02/19/2021 which showed stable to slight decrease in size of mediastinal lymph nodes.  Stable posttreatment changes in the left lung apex and slight decrease in size of the absent irregular nodule.  Stable sclerotic lesion in T2 vertebral body.  No new progressive findings.    Plan: 1.  Metastatic lung adenocarcinoma: - Last opdivo was on 12/29/2022.  She has been under monitoring since then. - She denies any recent respiratory infections. - Reviewed CT CAP from 03/23/2023: New superior segment left lower lobe nodular density, possibly mucoid impaction.  No other metastatic disease. - Labs: Normal LFTs and CBC. - Recommend follow-up in 3 months with repeat CT chest.   2.  High risk drug monitoring: - Continue Synthroid 25 mcg daily.  TSH is 2.315.   3.  Health maintenance: - Last mammogram was 07/18/2022, BI-RADS Category 1.   She will have another mammogram this August.   4.  CKD: - Creatinine is 1.23 and stable.   5.  Bone metastasis: - Bisphosphonates on hold due to ONJ in 2019.  6.  Chronic pains: - Continue oxycodone as needed.  7.  Weight loss: - Continue Marinol 2.5 mg twice daily.    Orders Placed This Encounter  Procedures   CT Chest W Contrast    Standing Status:   Future    Standing Expiration Date:   03/29/2024    Order Specific Question:   If indicated for the ordered procedure, I authorize the administration of contrast media per Radiology protocol    Answer:   Yes    Order Specific Question:   Does the patient have a contrast media/X-ray dye allergy?    Answer:   No    Order Specific Question:   Preferred imaging location?    Answer:   [facility]   CBC with Differential    Standing Status:   Future    Standing Expiration Date:   03/29/2024   Comprehensive metabolic panel    Standing Status:   Future    Standing Expiration Date:   03/29/2024   TSH    Standing Status:   Future    Standing Expiration Date:   03/29/2024      I,Katie Daubenspeck,acting as a scribe for Doreatha Massed, MD.,have documented all relevant documentation on the behalf of Doreatha Massed, MD,as directed by  Doreatha Massed, MD while in the presence of Doreatha Massed, MD.   I, Doreatha Massed MD, have  reviewed the above documentation for accuracy and completeness, and I agree with the above.   Doreatha Massed, MD   5/9/202412:50 PM  CHIEF COMPLAINT:   Diagnosis: metastatic right lung cancer    Cancer Staging  No matching staging information was found for the patient.   Prior Therapy: 1. Bevacizumab, cisplatin and pemetrexed x 4 cycles from 09/02/2014 to 11/04/2014. 2. Bevacizumab and pemetrexed x 3 cycles from 12/24/2014 to 02/04/2015. 3. Nivolumab every 4 weeks from 02/25/2015 to 12/29/2022  Current Therapy:  surveillance   HISTORY OF PRESENT ILLNESS:   Oncology  History  Cancer of upper lobe of right lung (HCC)  07/28/2014 Imaging   CT chest: Large R apical mass consistent with malignancy. This is destroying the R 2nd rib with extension into adjacent soft tissue. R hilar adenopathy with R 5cm adrenal metastatic lesion.   08/01/2014 Initial Biopsy   Lung, needle/core biopsy(ies), right upper lobe - POORLY DIFFERENTIATED ADENOCARCINOMA, SEE COMMENT.   08/08/2014 PET scan   Large hypermetabolic R apical mass with evidence of direct chest wall and mediastinal invasion, right retrocrural lymphadenopathy, extensive retroperitoneal lymphadenopathy, and metastatic lesions to the adrenal glands    09/02/2014 - 11/04/2014 Chemotherapy   Cisplatin/Pemetrexed/Avastin every 21 days x 4 cycles   10/07/2014 - 10/27/2014 Radiation Therapy   Right lung apex for control of brachioplexopathy.   12/24/2014 - 02/25/2015 Chemotherapy   Alimta/Avastin every 21 days.   02/20/2015 Imaging   Increase in size of right adrenal metastasis and subjacent confluent retrocaval lymphadenopathy   02/25/2015 -  Chemotherapy   Nivolumab, zometa   05/04/2015 Imaging   CT CAP- Stable to slight decrease in the posterior right apical lesion. Stable appearance of posterior right upper rib an upper thoracic bony lesions. Slight improvement in right upper lobe tree-in-bud opacity. No new or progressive findings in...   07/28/2015 Imaging   CT CAP- Reduced size of the right apical pleural parenchymal lesion and reduced size of the right adrenal metastatic lesion. Resolution of prior retrocrural adenopathy.  Right eccentric T1 and T2 sclerosis with sclerosis and tapering of the right second..   11/17/2015 Imaging   CT CAP- Stable soft tissue thickening in the apex of the right hemi thorax. Stable right adrenal metastasis. Nodularity along the trachea and mainstem bronchi, relatively new from 07/28/2015, favoring adherent debris.   11/18/2015 Treatment Plan Change   Zometa HELD for upcoming tooth  extraction   11/24/2015 Treatment Plan Change   Zometa on hold at this time in preparation for tooth extraction in March 2017.  Zometa las given on 11/18/2015.   02/03/2016 Imaging   CT CAP- Heterogeneous right apical masslike consolidation and right adrenal metastasis are unchanged   04/06/2016 Treatment Plan Change   Zometa restarted 6 weeks out from tooth extraction (04/06/2016)   05/12/2016 Imaging   CT CAP- NED in the chest, abdomen or pelvis.  Some areas of nodularity associated with the mainstem bronchi in the left upper lobe bronchus, favored to represent adherent inspissated secretions   08/17/2016 Imaging   CT CAP- 1. Stable CTs of the chest and abdomen. No evidence of progressive metastatic disease. 2. Probable treated tumor at the right apex, right adrenal gland and T2 vertebral body, stable. 3. Fluctuating nodularity along the walls of the trachea and mainstem bronchi, likely secretions.   12/12/2016 Imaging   Further decrease in size of treated tumor within the right apex. 2. Stable treated tumor involving the right second rib and T2 vertebra. 3. Stable right adrenal gland  treated tumor. 4. Emphysema 5. Aortic atherosclerosis   01/04/2017 Imaging   MRI brain- Normal brain MRI.  No intracranial metastatic disease.   03/15/2017 Imaging   CT CAP- 1. No new or progressive metastatic disease in the chest or abdomen. 2. Stable treated tumor in the apical right upper lobe. Stable treated right posterior second rib and right T2 vertebral lesions. Stable treated right adrenal metastasis. 3. Aortic atherosclerosis. 4. Moderate emphysema with mild diffuse bronchial wall thickening, suggesting COPD.   06/12/2017 Imaging   CT CAP 1. Similar appearance of treated primary within the right apex. 2. Similar areas of sclerosis within the right second rib, T2, and less so T1 vertebral bodies. These are most consistent with treated metastasis. 3. Similar right adrenal treated  metastasis. 4. No evidence of new or progressive disease. 5. Similar right and progressive left areas of bronchial wall thickening and probable mucoid impaction. Correlate with interval infectious symptoms. 6.  Emphysema (ICD10-J43.9). 7. Coronary artery atherosclerosis. Aortic Atherosclerosis (ICD10-I70.0).     10/16/2017 Imaging   CT CAP 1. Stable appearance of the prior Pancoast tumor and related bony findings in the right second rib and right T1 and T2 vertebra compatible with successfully treated tumor. No significant enlargement or new lesions are identified. Similarly the treated right adrenal metastatic lesion is stable in appearance. 2. Upper normal size right hilar lymph node may warrant surveillance. Currently 9 mm in short axis. 3. Other imaging findings of potential clinical significance: Aortic Atherosclerosis (ICD10-I70.0) and Emphysema (ICD10-J43.9). Scattered proximal sigmoid colon diverticula. Mucus plugging medially in the left upper lobe and posteriorly in the right upper lobe.     11/29/2017 - 06/16/2022 Chemotherapy   Patient is on Treatment Plan : LUNG Nivolumab q28d     07/14/2022 -  Chemotherapy   Patient is on Treatment Plan : LUNG Nivolumab (480) q28d        INTERVAL HISTORY:   Melissa Sullivan is a 67 y.o. female presenting to clinic today for follow up of metastatic right lung cancer. She was last seen by me on 12/29/22.  Since her last visit, she underwent restaging CT C/A/P on 03/23/23 showing: presumable treatment-related right apical pleuroparenchymal scarring; new superior LLL nodular density, possibly area of mucoid impaction given morphology; otherwise, no active metastatic disease.  Today, she states that she is doing well overall. Her appetite level is at 75%. Her energy level is at 75%.  PAST MEDICAL HISTORY:   Past Medical History: Past Medical History:  Diagnosis Date   Adrenal mass, right (HCC) 07/28/2014   Anemia    Bone metastases 04/05/2016    Cancer (HCC) 2015   lung  right   Diabetes mellitus without complication (HCC)    GERD (gastroesophageal reflux disease)    Hyperlipidemia    Hypertension    Hypothyroidism due to medication 01/30/2017   Lung mass 07/28/2014   Reflux     Surgical History: Past Surgical History:  Procedure Laterality Date   AMPUTATION Right 02/22/2017   Procedure: PARTIAL AMPUTATION RIGHT GREAT TOE;  Surgeon: Ferman Hamming, DPM;  Location: [facility];  Service: Podiatry;  Laterality: Right;   APPENDECTOMY     BIOPSY  09/22/2020   Procedure: BIOPSY;  Surgeon: Lanelle Bal, DO;  Location: [facility];  Service: Endoscopy;;   COLONOSCOPY WITH PROPOFOL N/A 09/22/2020   non-bleeding internal hemorrhoids, sigmoid and descending colon diverticulosis, localized mild inflammation in sigmoid that was felt due to prep artifact.    ESOPHAGOGASTRODUODENOSCOPY N/A 12/09/2014   [id number]  esophageal stricture/mild-to-noderate erosive gastritis. negative H.pylori   FLEXIBLE SIGMOIDOSCOPY  2011   Dr. Darrick Penna: hyperplastic polyp   LUNG BIOPSY Right 07/2014   CT guided   MALONEY DILATION N/A 12/09/2014   Procedure: MALONEY DILATION;  Surgeon: West Bali, MD;  Location: [facility];  Service: Endoscopy;  Laterality: N/A;   PORTACATH PLACEMENT Left 09/01/2014   SAVORY DILATION N/A 12/09/2014   Procedure: SAVORY DILATION;  Surgeon: West Bali, MD;  Location: [facility];  Service: Endoscopy;  Laterality: N/A;    Social History: Social History   Socioeconomic History   Marital status: Married    Spouse name: Not on file   Number of children: Not on file   Years of education: Not on file   Highest education level: Not on file  Occupational History   Not on file  Tobacco Use   Smoking status: Light Smoker    Packs/day: 0.30    Years: 20.00    Additional pack years: 0.00    Total pack years: 6.00    Types: Cigarettes   Smokeless tobacco: Never  Vaping Use   Vaping Use: Never used  Substance and  Sexual Activity   Alcohol use: No   Drug use: No   Sexual activity: Not on file  Other Topics Concern   Not on file  Social History Narrative   Not on file   Social Determinants of Health   Financial Resource Strain: Low Risk  (12/03/2020)   Overall Financial Resource Strain (CARDIA)    Difficulty of Paying Living Expenses: Not hard at all  Food Insecurity: No Food Insecurity (12/03/2020)   Hunger Vital Sign    Worried About Running Out of Food in the Last Year: Never true    Ran Out of Food in the Last Year: Never true  Transportation Needs: No Transportation Needs (12/03/2020)   PRAPARE - Administrator, Civil Service (Medical): No    Lack of Transportation (Non-Medical): No  Physical Activity: Not on file  Stress: No Stress Concern Present (12/03/2020)   [employer] of Occupational Health - Occupational Stress Questionnaire    Feeling of Stress : Only a little  Social Connections: Moderately Isolated (12/03/2020)   Social Connection and Isolation Panel [NHANES]    Frequency of Communication with Friends and Family: More than three times a week    Frequency of Social Gatherings with Friends and Family: More than three times a week    Attends Religious Services: Never    Database administrator or Organizations: No    Attends Banker Meetings: Never    Marital Status: Married  Catering manager Violence: Not At Risk (12/03/2020)   Humiliation, Afraid, Rape, and Kick questionnaire    Fear of Current or Ex-Partner: No    Emotionally Abused: No    Physically Abused: No    Sexually Abused: No    Family History: Family History  Problem Relation Age of Onset   Cancer Sister    Colon cancer Neg Hx     Current Medications:  Current Outpatient Medications:    albuterol (VENTOLIN HFA) 108 (90 Base) MCG/ACT inhaler, Inhale 2 puffs into the lungs every 6 (six) hours as needed for wheezing or shortness of breath., Disp: , Rfl:    dronabinol (MARINOL) 2.5  MG capsule, Take 1 capsule (2.5 mg total) by mouth 2 (two) times daily before lunch and supper., Disp: 60 capsule, Rfl: 5   ENSURE (ENSURE), Take 1 Can  by mouth 4 (four) times daily., Disp: , Rfl:    Ferrous Sulfate (IRON) 325 (65 Fe) MG TABS, Take 325 mg by mouth daily. , Disp: , Rfl:    fluticasone (FLONASE) 50 MCG/ACT nasal spray, fluticasone propionate 50 mcg/actuation nasal spray,suspension, Disp: , Rfl:    fluticasone (FLONASE) 50 MCG/ACT nasal spray, Place into both nostrils., Disp: , Rfl:    fluticasone-salmeterol (ADVAIR DISKUS) 100-50 MCG/ACT AEPB, INHALE 1 DOSE BY MOUTH TWICE DAILY, Disp: , Rfl:    Fluticasone-Salmeterol (ADVAIR DISKUS) 500-50 MCG/DOSE AEPB, Inhale 1 puff into the lungs 2 (two) times daily., Disp: 60 each, Rfl: 6   levocetirizine (XYZAL) 5 MG tablet, Take 5 mg by mouth every evening. , Disp: , Rfl:    levothyroxine (SYNTHROID) 25 MCG tablet, TAKE 1/2 (ONE-HALF) TABLET BY MOUTH ONCE DAILY BEFORE BREAKFAST, Disp: 45 tablet, Rfl: 3   lidocaine-prilocaine (EMLA) cream, Apply 1 Application topically as needed (port access)., Disp: 30 g, Rfl: 3   naloxone (NARCAN) nasal spray 4 mg/0.1 mL, , Disp: , Rfl:    Nivolumab (OPDIVO IV), Inject into the vein every 28 (twenty-eight) days., Disp: , Rfl:    omeprazole (PRILOSEC) 40 MG capsule, Take 1 capsule (40 mg total) by mouth daily., Disp: 30 capsule, Rfl: 11   Oxycodone HCl 10 MG TABS, Take 1 tablet (10 mg total) by mouth every 12 (twelve) hours as needed (pain.)., Disp: 60 tablet, Rfl: 0   promethazine (PHENERGAN) 6.25 MG/5ML syrup, every 4 (four) hours as needed., Disp: , Rfl:    pseudoephedrine-guaifenesin (MUCINEX D) 60-600 MG 12 hr tablet, Take 1 tablet by mouth daily as needed for congestion., Disp: , Rfl:    rosuvastatin (CRESTOR) 20 MG tablet, Take 1 tablet by mouth daily., Disp: , Rfl:    rosuvastatin (CRESTOR) 20 MG tablet, Take 20 mg by mouth daily., Disp: , Rfl:    simvastatin (ZOCOR) 40 MG tablet, simvastatin 40 mg  tablet  TAKE 1 TABLET BY MOUTH ONCE DAILY, Disp: , Rfl:  No current facility-administered medications for this visit.  Facility-Administered Medications Ordered in Other Visits:    sodium chloride flush (NS) 0.9 % injection 10 mL, 10 mL, Intracatheter, PRN, Doreatha Massed, MD, 10 mL at 01/03/19 0930   Allergies: Allergies  Allergen Reactions   Xgeva [Denosumab]     osteonecrosis    REVIEW OF SYSTEMS:   Review of Systems  Constitutional:  Positive for fatigue. Negative for chills and fever.  HENT:   Negative for lump/mass, mouth sores, nosebleeds, sore throat and trouble swallowing.   Eyes:  Negative for eye problems.  Respiratory:  Negative for cough and shortness of breath.   Cardiovascular:  Negative for chest pain, leg swelling and palpitations.  Gastrointestinal:  Negative for abdominal pain, constipation, diarrhea, nausea and vomiting.  Genitourinary:  Negative for bladder incontinence, difficulty urinating, dysuria, frequency, hematuria and nocturia.   Musculoskeletal:  Negative for arthralgias, back pain, flank pain, myalgias and neck pain.  Skin:  Negative for itching and rash.  Neurological:  Negative for dizziness, headaches and numbness.  Hematological:  Does not bruise/bleed easily.  Psychiatric/Behavioral:  Negative for depression, sleep disturbance and suicidal ideas. The patient is not nervous/anxious.   All other systems reviewed and are negative.    VITALS:   Blood pressure (!) 169/88, pulse 95, temperature 97.6 F (36.4 C), temperature source Oral, resp. rate 18, weight 91 lb 14.4 oz (41.7 kg), SpO2 96 %.  Wt Readings from Last 3 Encounters:  03/30/23 91 lb 14.4  oz (41.7 kg)  12/29/22 99 lb 14.1 oz (45.3 kg)  12/02/22 99 lb 12.8 oz (45.3 kg)    Body mass index is 18.56 kg/m.  Performance status (ECOG): 1 - Symptomatic but completely ambulatory  PHYSICAL EXAM:   Physical Exam Vitals and nursing note reviewed. Exam conducted with a chaperone  present.  Constitutional:      Appearance: Normal appearance.  Cardiovascular:     Rate and Rhythm: Normal rate and regular rhythm.     Pulses: Normal pulses.     Heart sounds: Normal heart sounds.  Pulmonary:     Effort: Pulmonary effort is normal.     Breath sounds: Normal breath sounds.  Abdominal:     Palpations: Abdomen is soft. There is no hepatomegaly, splenomegaly or mass.     Tenderness: There is no abdominal tenderness.  Musculoskeletal:     Right lower leg: No edema.     Left lower leg: No edema.  Lymphadenopathy:     Cervical: No cervical adenopathy.     Right cervical: No superficial, deep or posterior cervical adenopathy.    Left cervical: No superficial, deep or posterior cervical adenopathy.     Upper Body:     Right upper body: No supraclavicular or axillary adenopathy.     Left upper body: No supraclavicular or axillary adenopathy.  Neurological:     General: No focal deficit present.     Mental Status: She is alert and oriented to person, place, and time.  Psychiatric:        Mood and Affect: Mood normal.        Behavior: Behavior normal.     LABS:      Latest Ref Rng & Units 03/23/2023    8:42 AM 12/29/2022   11:21 AM 12/02/2022    9:00 AM  CBC  WBC 4.0 - 10.5 K/uL 9.8  9.3  9.7   Hemoglobin 12.0 - 15.0 g/dL 47.8  29.5  62.1   Hematocrit 36.0 - 46.0 % 43.1  40.2  41.0   Platelets 150 - 400 K/uL 254  260  251       Latest Ref Rng & Units 03/23/2023    8:42 AM 12/29/2022   11:21 AM 12/02/2022    9:00 AM  CMP  Glucose 70 - 99 mg/dL 308  87  99   BUN 8 - 23 mg/dL 13  15  13    Creatinine 0.44 - 1.00 mg/dL 6.57  8.46  9.62   Sodium 135 - 145 mmol/L 137  136  137   Potassium 3.5 - 5.1 mmol/L 4.0  4.0  3.8   Chloride 98 - 111 mmol/L 102  103  103   CO2 22 - 32 mmol/L 24  24  25    Calcium 8.9 - 10.3 mg/dL 8.9  9.3  9.1   Total Protein 6.5 - 8.1 g/dL 7.7  7.5  7.4   Total Bilirubin 0.3 - 1.2 mg/dL 1.1  0.5  0.4   Alkaline Phos 38 - 126 U/L 72  62  63    AST 15 - 41 U/L 30  21  22    ALT 0 - 44 U/L 21  17  14       No results found for: "CEA1", "CEA" / No results found for: "CEA1", "CEA" No results found for: "PSA1" No results found for: "XBM841" No results found for: "CAN125"  No results found for: "TOTALPROTELP", "ALBUMINELP", "A1GS", "A2GS", "BETS", "BETA2SER", "GAMS", "MSPIKE", "SPEI" Lab Results  Component Value Date   TIBC 286 12/02/2021   TIBC 274 02/17/2016   TIBC 228 (L) 01/15/2015   FERRITIN 28 12/02/2021   FERRITIN 115 02/17/2016   FERRITIN 800 (H) 01/15/2015   IRONPCTSAT 17 12/02/2021   IRONPCTSAT 26 02/17/2016   IRONPCTSAT 68 (H) 01/15/2015   Lab Results  Component Value Date   LDH 119 06/16/2022   LDH 158 01/27/2022   LDH 124 01/27/2022     STUDIES:   CT CHEST ABDOMEN PELVIS W CONTRAST  Result Date: 03/27/2023 CLINICAL DATA:  Right upper lobe lung cancer with metastasis. Ongoing chemotherapy. Status post radiation therapy. Diabetes. Hypertension. Adrenal mass. Bone metastasis. Appendectomy. * Tracking Code: BO * EXAM: CT CHEST, ABDOMEN, AND PELVIS WITH CONTRAST TECHNIQUE: Multidetector CT imaging of the chest, abdomen and pelvis was performed following the standard protocol during bolus administration of intravenous contrast. RADIATION DOSE REDUCTION: This exam was performed according to the departmental dose-optimization program which includes automated exposure control, adjustment of the mA and/or kV according to patient size and/or use of iterative reconstruction technique. CONTRAST:  75mL OMNIPAQUE IOHEXOL 300 MG/ML  SOLN COMPARISON:  12/22/2022 FINDINGS: CT CHEST FINDINGS Cardiovascular: Left Port-A-Cath tip mid SVC. Aortic atherosclerosis. Normal heart size, without pericardial effusion. No central pulmonary embolism, on this non-dedicated study. Mediastinum/Nodes: No supraclavicular adenopathy. No mediastinal or hilar adenopathy. Lungs/Pleura: No pleural fluid. Centrilobular and paraseptal emphysema. Presumed  secretions within the dependent and nondependent trachea and mainstem bronchi. Right apical pleuroparenchymal scarring and calcification are similar and likely treatment related. Mucoid impaction in the anterior left upper lobe on 74/3 is similar. Similar findings within the lingula on 93/3. Superior segment left lower lobe 7 mm apparent nodular density on 56/3 is new since the prior but may also represent an area of mucoid impaction. Musculoskeletal: No acute osseous abnormality. Similar right-sided T2 sclerosis and mild vertebral body height loss. CT ABDOMEN PELVIS FINDINGS Hepatobiliary: Normal liver. Normal gallbladder, without biliary ductal dilatation. Pancreas: Normal, without mass or ductal dilatation. Spleen: Normal in size, without focal abnormality. Adrenals/Urinary Tract: Normal left adrenal gland. Calcified right adrenal nodule measures 2.2 x 1.1 cm on 53/2 and is similar to on the prior (when remeasured). Scarring in the upper pole right kidney. No hydronephrosis. Normal urinary bladder. Stomach/Bowel: Proximal gastric underdistention. Normal colon and terminal ileum. Normal small bowel. Vascular/Lymphatic: Aortic atherosclerosis. No abdominopelvic adenopathy. Reproductive: Normal uterus and adnexa. Other: No significant free fluid. No evidence of omental or peritoneal disease. Musculoskeletal: Tiny sclerotic lesion in the left acetabulum is similar and likely a bone island. IMPRESSION: 1. Presumably treatment related right apical pleuroparenchymal scarring. 2. New superior segment left lower lobe nodular density. Possibly an area of mucoid impaction, given morphology and other areas of left-sided mucoid impaction. Consider chest CT follow-up at 3-6 months. 3. Otherwise, no active metastatic disease. 4. Similar right adrenal calcified nodule. 5. Similar of the T2 vertebral body. 6. Aortic atherosclerosis (ICD10-I70.0), coronary artery atherosclerosis and emphysema (ICD10-J43.9). Electronically Signed    By: Jeronimo Greaves M.D.   On: 03/27/2023 10:20

## 2023-03-30 ENCOUNTER — Inpatient Hospital Stay: Payer: Medicare HMO

## 2023-03-30 ENCOUNTER — Inpatient Hospital Stay (HOSPITAL_BASED_OUTPATIENT_CLINIC_OR_DEPARTMENT_OTHER): Payer: Medicare HMO | Admitting: Hematology

## 2023-03-30 VITALS — BP 158/80 | HR 95 | Temp 97.6°F | Resp 18 | Wt 91.9 lb

## 2023-03-30 DIAGNOSIS — C3411 Malignant neoplasm of upper lobe, right bronchus or lung: Secondary | ICD-10-CM | POA: Diagnosis not present

## 2023-03-30 DIAGNOSIS — R634 Abnormal weight loss: Secondary | ICD-10-CM | POA: Diagnosis not present

## 2023-03-30 DIAGNOSIS — F17210 Nicotine dependence, cigarettes, uncomplicated: Secondary | ICD-10-CM | POA: Diagnosis not present

## 2023-03-30 DIAGNOSIS — Z79891 Long term (current) use of opiate analgesic: Secondary | ICD-10-CM | POA: Diagnosis not present

## 2023-03-30 DIAGNOSIS — Z79899 Other long term (current) drug therapy: Secondary | ICD-10-CM | POA: Diagnosis not present

## 2023-03-30 DIAGNOSIS — Z79890 Hormone replacement therapy (postmenopausal): Secondary | ICD-10-CM | POA: Diagnosis not present

## 2023-03-30 DIAGNOSIS — N189 Chronic kidney disease, unspecified: Secondary | ICD-10-CM | POA: Diagnosis not present

## 2023-03-30 DIAGNOSIS — G8929 Other chronic pain: Secondary | ICD-10-CM | POA: Diagnosis not present

## 2023-03-30 DIAGNOSIS — Z923 Personal history of irradiation: Secondary | ICD-10-CM | POA: Diagnosis not present

## 2023-03-30 DIAGNOSIS — Z9221 Personal history of antineoplastic chemotherapy: Secondary | ICD-10-CM | POA: Diagnosis not present

## 2023-03-30 DIAGNOSIS — E039 Hypothyroidism, unspecified: Secondary | ICD-10-CM | POA: Diagnosis not present

## 2023-03-30 NOTE — Patient Instructions (Addendum)
[facility] at [facility] Discharge Instructions   You were seen and examined today by Dr. Ellin Saba.  He reviewed the results of your CT scan which is stable. There is no evidence of cancer seen on this exam.   We will see you back in . We will repeat a scan and lab work prior to your next visit.    Thank you for choosing [facility] at [facility] to provide your oncology and hematology care.  To afford each patient quality time with our provider, please arrive at least 15 minutes before your scheduled appointment time.   If you have a lab appointment with the Cancer Center please come in thru the Main Entrance and check in at the main information desk.  You need to re-schedule your appointment should you arrive 10 or more minutes late.  We strive to give you quality time with our providers, and arriving late affects you and other patients whose appointments are after yours.  Also, if you no show three or more times for appointments you may be dismissed from the clinic at the providers discretion.     Again, thank you for choosing [facility].  Our hope is that these requests will decrease the amount of time that you wait before being seen by our physicians.       _____________________________________________________________  Should you have questions after your visit to [facility], please contact our office at [phone] and follow the prompts.  Our office hours are 8:00 a.m. and 4:30 p.m. Monday - Friday.  Please note that voicemails left after 4:00 p.m. may not be returned until the following business day.  We are closed weekends and major holidays.  You do have access to a nurse 24-7, just call the main number to the clinic [phone] and do not press any options, hold on the line and a nurse will answer the phone.    For prescription refill requests, have your pharmacy contact our office and allow 72 hours.     Due to Covid, you will need to wear a mask upon entering the hospital. If you do not have a mask, a mask will be given to you at the Main Entrance upon arrival. For doctor visits, patients may have 1 support person age 67 or older with them. For treatment visits, patients can not have anyone with them due to social distancing guidelines and our immunocompromised population.

## 2023-03-31 ENCOUNTER — Other Ambulatory Visit: Payer: Self-pay

## 2023-04-21 DIAGNOSIS — E782 Mixed hyperlipidemia: Secondary | ICD-10-CM | POA: Diagnosis not present

## 2023-04-21 DIAGNOSIS — R7301 Impaired fasting glucose: Secondary | ICD-10-CM | POA: Diagnosis not present

## 2023-04-21 DIAGNOSIS — R946 Abnormal results of thyroid function studies: Secondary | ICD-10-CM | POA: Diagnosis not present

## 2023-04-22 DIAGNOSIS — E039 Hypothyroidism, unspecified: Secondary | ICD-10-CM | POA: Diagnosis not present

## 2023-04-22 DIAGNOSIS — Z79899 Other long term (current) drug therapy: Secondary | ICD-10-CM | POA: Diagnosis not present

## 2023-04-22 DIAGNOSIS — I129 Hypertensive chronic kidney disease with stage 1 through stage 4 chronic kidney disease, or unspecified chronic kidney disease: Secondary | ICD-10-CM | POA: Diagnosis not present

## 2023-04-22 DIAGNOSIS — E785 Hyperlipidemia, unspecified: Secondary | ICD-10-CM | POA: Diagnosis not present

## 2023-04-22 DIAGNOSIS — E1151 Type 2 diabetes mellitus with diabetic peripheral angiopathy without gangrene: Secondary | ICD-10-CM | POA: Diagnosis not present

## 2023-04-22 DIAGNOSIS — N1831 Chronic kidney disease, stage 3a: Secondary | ICD-10-CM | POA: Diagnosis not present

## 2023-04-22 DIAGNOSIS — I2510 Atherosclerotic heart disease of native coronary artery without angina pectoris: Secondary | ICD-10-CM | POA: Diagnosis not present

## 2023-04-22 DIAGNOSIS — E1142 Type 2 diabetes mellitus with diabetic polyneuropathy: Secondary | ICD-10-CM | POA: Diagnosis not present

## 2023-04-22 DIAGNOSIS — K219 Gastro-esophageal reflux disease without esophagitis: Secondary | ICD-10-CM | POA: Diagnosis not present

## 2023-04-22 DIAGNOSIS — E1122 Type 2 diabetes mellitus with diabetic chronic kidney disease: Secondary | ICD-10-CM | POA: Diagnosis not present

## 2023-04-22 DIAGNOSIS — E1136 Type 2 diabetes mellitus with diabetic cataract: Secondary | ICD-10-CM | POA: Diagnosis not present

## 2023-04-22 DIAGNOSIS — J309 Allergic rhinitis, unspecified: Secondary | ICD-10-CM | POA: Diagnosis not present

## 2023-04-25 NOTE — Progress Notes (Unsigned)
Referring Provider: Benita Stabile, MD Primary Care Physician:  Benita Stabile, MD Primary GI Physician: Dr. Marletta Lor  Chief Complaint  Patient presents with   Follow-up    Follow up. No problems     HPI:   Melissa Sullivan is a 67 y.o. female with history of stage IV lung adenocarcinoma to the bones and right adrenal, HTN, HLD, hypothyroidism, diabetes, GERD, distal esophageal stricture s/p dilation January 2016, internal hemorrhoids, hyperplastic colon polyps, presenting today for yearly follow-up visit of GERD.  Last seen in our office 04/27/2022.  GERD was well-controlled on omeprazole 40 mg daily.  She had lost about 5 pounds as her appetite was not good due to her sister recently passing away and grieving.  Recommended continuing current medications, increasing caloric intake, and follow-up in 1 year.  Today:  GERD is well controlled. No dysphagia, N/V, abdominal, brbpr. Stools are dark on iron. Appetite isn't as good as no longer taking drabanol. Has discussed with Dr. Margo Aye and he has mentioned starting another medication, but she hasn't decided if she if wants to start this. She is going to talk to him again about it. She has picked up some weight. Eating 3 meals a day, some snacking, and drinking 3 ensure daily. Started eating more over the last 1-2 months. Some mild early satiety over the last year.   EGD in Jan 2016 with distal esophageal stricture s/p dilation, mild to moderate erosive gastritis. Negative KOH.  Denies any dysphagia odynophagia.  No chest or epigastric pain.   Last colonoscopy 09/22/2020 unremarkable besides internal hemorrhoids and diverticulosis.  Past Medical History:  Diagnosis Date   Adrenal mass, right (HCC) 07/28/2014   Anemia    Bone metastases 04/05/2016   Cancer (HCC) 2015   lung  right   Diabetes mellitus without complication (HCC)    GERD (gastroesophageal reflux disease)    Hyperlipidemia    Hypertension    Hypothyroidism due to medication 01/30/2017    Lung mass 07/28/2014   Reflux     Past Surgical History:  Procedure Laterality Date   AMPUTATION Right 02/22/2017   Procedure: PARTIAL AMPUTATION RIGHT GREAT TOE;  Surgeon: Ferman Hamming, DPM;  Location: [facility];  Service: Podiatry;  Laterality: Right;   APPENDECTOMY     BIOPSY  09/22/2020   Procedure: BIOPSY;  Surgeon: Lanelle Bal, DO;  Location: [facility];  Service: Endoscopy;;   COLONOSCOPY WITH PROPOFOL N/A 09/22/2020   non-bleeding internal hemorrhoids, sigmoid and descending colon diverticulosis, localized mild inflammation in sigmoid that was felt due to prep artifact.    ESOPHAGOGASTRODUODENOSCOPY N/A 12/09/2014   [id number] esophageal stricture/mild-to-noderate erosive gastritis. negative H.pylori   FLEXIBLE SIGMOIDOSCOPY  2011   Dr. Darrick Penna: hyperplastic polyp   LUNG BIOPSY Right 07/2014   CT guided   MALONEY DILATION N/A 12/09/2014   Procedure: MALONEY DILATION;  Surgeon: West Bali, MD;  Location: [facility];  Service: Endoscopy;  Laterality: N/A;   PORTACATH PLACEMENT Left 09/01/2014   SAVORY DILATION N/A 12/09/2014   Procedure: SAVORY DILATION;  Surgeon: West Bali, MD;  Location: [facility];  Service: Endoscopy;  Laterality: N/A;    Current Outpatient Medications  Medication Sig Dispense Refill   albuterol (VENTOLIN HFA) 108 (90 Base) MCG/ACT inhaler Inhale 2 puffs into the lungs every 6 (six) hours as needed for wheezing or shortness of breath.     ENSURE (ENSURE) Take 1 Can by mouth 4 (four) times daily.  Ferrous Sulfate (IRON) 325 (65 Fe) MG TABS Take 325 mg by mouth daily.      fluticasone (FLONASE) 50 MCG/ACT nasal spray fluticasone propionate 50 mcg/actuation nasal spray,suspension     Fluticasone-Salmeterol (ADVAIR DISKUS) 500-50 MCG/DOSE AEPB Inhale 1 puff into the lungs 2 (two) times daily. 60 each 6   levocetirizine (XYZAL) 5 MG tablet Take 5 mg by mouth every evening.      levothyroxine (SYNTHROID) 25 MCG tablet TAKE 1/2 (ONE-HALF)  TABLET BY MOUTH ONCE DAILY BEFORE BREAKFAST 45 tablet 3   lidocaine-prilocaine (EMLA) cream Apply 1 Application topically as needed (port access). 30 g 3   naloxone (NARCAN) nasal spray 4 mg/0.1 mL      Oxycodone HCl 10 MG TABS Take 1 tablet (10 mg total) by mouth every 12 (twelve) hours as needed (pain.). 60 tablet 0   promethazine (PHENERGAN) 6.25 MG/5ML syrup every 4 (four) hours as needed.     rosuvastatin (CRESTOR) 20 MG tablet Take 20 mg by mouth daily.     simvastatin (ZOCOR) 40 MG tablet simvastatin 40 mg tablet  TAKE 1 TABLET BY MOUTH ONCE DAILY     omeprazole (PRILOSEC) 40 MG capsule Take 1 capsule (40 mg total) by mouth daily before breakfast. 30 capsule 5   No current facility-administered medications for this visit.   Facility-Administered Medications Ordered in Other Visits  Medication Dose Route Frequency Provider Last Rate Last Admin   sodium chloride flush (NS) 0.9 % injection 10 mL  10 mL Intracatheter PRN Doreatha Massed, MD   10 mL at 01/03/19 0930    Allergies as of 04/27/2023 - Review Complete 04/27/2023  Allergen Reaction Noted   Rivka Barbara [denosumab]  08/15/2018    Family History  Problem Relation Age of Onset   Cancer Sister    Colon cancer Neg Hx     Social History   Socioeconomic History   Marital status: Married    Spouse name: Not on file   Number of children: Not on file   Years of education: Not on file   Highest education level: Not on file  Occupational History   Not on file  Tobacco Use   Smoking status: Light Smoker    Packs/day: 0.30    Years: 20.00    Additional pack years: 0.00    Total pack years: 6.00    Types: Cigarettes   Smokeless tobacco: Never  Vaping Use   Vaping Use: Never used  Substance and Sexual Activity   Alcohol use: No   Drug use: No   Sexual activity: Not on file  Other Topics Concern   Not on file  Social History Narrative   Not on file   Social Determinants of Health   Financial Resource Strain: Low  Risk  (12/03/2020)   Overall Financial Resource Strain (CARDIA)    Difficulty of Paying Living Expenses: Not hard at all  Food Insecurity: No Food Insecurity (12/03/2020)   Hunger Vital Sign    Worried About Running Out of Food in the Last Year: Never true    Ran Out of Food in the Last Year: Never true  Transportation Needs: No Transportation Needs (12/03/2020)   PRAPARE - Administrator, Civil Service (Medical): No    Lack of Transportation (Non-Medical): No  Physical Activity: Not on file  Stress: No Stress Concern Present (12/03/2020)   [employer] of Occupational Health - Occupational Stress Questionnaire    Feeling of Stress : Only a little  Social Connections:  Moderately Isolated (12/03/2020)   Social Connection and Isolation Panel [NHANES]    Frequency of Communication with Friends and Family: More than three times a week    Frequency of Social Gatherings with Friends and Family: More than three times a week    Attends Religious Services: Never    Database administrator or Organizations: No    Attends Engineer, structural: Never    Marital Status: Married    Review of Systems: Gen: Denies fever, chills, cold or flulike symptoms, presyncope, syncope. CV: Denies chest pain, palpitations. Resp: Denies dyspnea, cough. GI: See HPI Heme: See HPI  Physical Exam: BP 135/79 (BP Location: Right Arm, Patient Position: Sitting, Cuff Size: Small)   Pulse 89   Temp 97.9 F (36.6 C) (Temporal)   Ht 4\' 11"  (1.499 m)   Wt 94 lb 6.4 oz (42.8 kg)   SpO2 97%   BMI 19.07 kg/m  General:   Alert and oriented. No distress noted. Pleasant and cooperative.  Head:  Normocephalic and atraumatic. Eyes:  Conjuctiva clear without scleral icterus. Heart:  S1, S2 present without murmurs appreciated. Lungs:  Clear to auscultation bilaterally. No wheezes, rales, or rhonchi. No distress.  Abdomen:  +BS, soft, non-tender and non-distended. No rebound or guarding. No HSM or  masses noted. Msk:  Symmetrical without gross deformities. Normal posture. Extremities:  Without edema. Neurologic:  Alert and  oriented x4 Psych:  Normal mood and affect.    Assessment:  67 y.o. female with history of stage IV lung adenocarcinoma to the bones and right adrenal, HTN, HLD, hypothyroidism, diabetes, GERD, distal esophageal stricture s/p dilation January 2016, internal hemorrhoids, hyperplastic colon polyps, presenting today for yearly follow-up visit of GERD.   GERD: Well-controlled on omeprazole 40 mg daily.  Loss of weight: Patient had 8 pound weight loss between February and May 2024.  She has since gained about 3 pounds back.  She reports her appetite has not been as great as she is no longer receiving dronabinol due to [facility] having trouble getting this medication.  States Dr. Margo Aye has discussed other options with her, but she has not started any new medication yet as she has not made a decision on this.  She has no significant GI symptoms.  She does report some mild early satiety over the last year, but nothing significant.  She has increased her oral intake, not eating 3 meals a day and drinking 3 Ensure a day.  I recommended that she continue to monitor her weight and let me know if she loses an additional 5 pounds.   Plan:  Continue omeprazole 40 mg daily. Continue to eat 3 meals daily, snacks between, 3 Ensure daily to help maintain/gain weight. Patient is to call if she loses an additional 5 pounds. Follow-up in 6 months or sooner if needed.   Ermalinda Memos, PA-C [facility] 04/27/2023

## 2023-04-27 ENCOUNTER — Encounter: Payer: Self-pay | Admitting: Gastroenterology

## 2023-04-27 ENCOUNTER — Ambulatory Visit (INDEPENDENT_AMBULATORY_CARE_PROVIDER_SITE_OTHER): Payer: Medicare HMO | Admitting: Gastroenterology

## 2023-04-27 VITALS — BP 135/79 | HR 89 | Temp 97.9°F | Ht 59.0 in | Wt 94.4 lb

## 2023-04-27 DIAGNOSIS — D509 Iron deficiency anemia, unspecified: Secondary | ICD-10-CM | POA: Diagnosis not present

## 2023-04-27 DIAGNOSIS — E440 Moderate protein-calorie malnutrition: Secondary | ICD-10-CM | POA: Diagnosis not present

## 2023-04-27 DIAGNOSIS — N1831 Chronic kidney disease, stage 3a: Secondary | ICD-10-CM | POA: Diagnosis not present

## 2023-04-27 DIAGNOSIS — J45909 Unspecified asthma, uncomplicated: Secondary | ICD-10-CM | POA: Diagnosis not present

## 2023-04-27 DIAGNOSIS — J4520 Mild intermittent asthma, uncomplicated: Secondary | ICD-10-CM | POA: Diagnosis not present

## 2023-04-27 DIAGNOSIS — C3490 Malignant neoplasm of unspecified part of unspecified bronchus or lung: Secondary | ICD-10-CM | POA: Diagnosis not present

## 2023-04-27 DIAGNOSIS — R946 Abnormal results of thyroid function studies: Secondary | ICD-10-CM | POA: Diagnosis not present

## 2023-04-27 DIAGNOSIS — I700 Atherosclerosis of aorta: Secondary | ICD-10-CM | POA: Diagnosis not present

## 2023-04-27 DIAGNOSIS — R7301 Impaired fasting glucose: Secondary | ICD-10-CM | POA: Diagnosis not present

## 2023-04-27 DIAGNOSIS — R634 Abnormal weight loss: Secondary | ICD-10-CM

## 2023-04-27 DIAGNOSIS — E782 Mixed hyperlipidemia: Secondary | ICD-10-CM | POA: Diagnosis not present

## 2023-04-27 DIAGNOSIS — K219 Gastro-esophageal reflux disease without esophagitis: Secondary | ICD-10-CM

## 2023-04-27 DIAGNOSIS — Z23 Encounter for immunization: Secondary | ICD-10-CM | POA: Diagnosis not present

## 2023-04-27 MED ORDER — OMEPRAZOLE 40 MG PO CPDR: 40.0000 mg | DELAYED_RELEASE_CAPSULE | Freq: Every day | ORAL | 5 refills | Status: DC

## 2023-04-27 NOTE — Patient Instructions (Signed)
Continue taking omeprazole 40 mg daily 30 minutes before breakfast for acid reflux.  Continue to eat 3 meals daily, snacks between, and 3 Ensure daily to help maintain/gain weight.  If you notice that you lose an additional 5 pounds, please let me know.  We will follow-up with you in 6 months or sooner if needed.  It was very nice to meet you today!  Ermalinda Memos, PA-C [facility]

## 2023-05-04 DIAGNOSIS — B351 Tinea unguium: Secondary | ICD-10-CM | POA: Diagnosis not present

## 2023-05-04 DIAGNOSIS — G629 Polyneuropathy, unspecified: Secondary | ICD-10-CM | POA: Diagnosis not present

## 2023-05-16 ENCOUNTER — Encounter: Payer: Self-pay | Admitting: Hematology

## 2023-06-30 ENCOUNTER — Ambulatory Visit (HOSPITAL_COMMUNITY)
Admission: RE | Admit: 2023-06-30 | Discharge: 2023-06-30 | Disposition: A | Discharge status: Home / Self Care | Payer: Medicare HMO | Source: Ambulatory Visit | Attending: Hematology | Admitting: Hematology

## 2023-06-30 ENCOUNTER — Inpatient Hospital Stay: Payer: Medicare HMO | Attending: Hematology

## 2023-06-30 DIAGNOSIS — E039 Hypothyroidism, unspecified: Secondary | ICD-10-CM | POA: Insufficient documentation

## 2023-06-30 DIAGNOSIS — C3411 Malignant neoplasm of upper lobe, right bronchus or lung: Secondary | ICD-10-CM

## 2023-06-30 DIAGNOSIS — Z79891 Long term (current) use of opiate analgesic: Secondary | ICD-10-CM | POA: Diagnosis not present

## 2023-06-30 DIAGNOSIS — Z923 Personal history of irradiation: Secondary | ICD-10-CM | POA: Diagnosis not present

## 2023-06-30 DIAGNOSIS — R0789 Other chest pain: Secondary | ICD-10-CM | POA: Insufficient documentation

## 2023-06-30 DIAGNOSIS — R634 Abnormal weight loss: Secondary | ICD-10-CM | POA: Insufficient documentation

## 2023-06-30 DIAGNOSIS — F17210 Nicotine dependence, cigarettes, uncomplicated: Secondary | ICD-10-CM | POA: Diagnosis not present

## 2023-06-30 DIAGNOSIS — I700 Atherosclerosis of aorta: Secondary | ICD-10-CM | POA: Diagnosis not present

## 2023-06-30 DIAGNOSIS — R0781 Pleurodynia: Secondary | ICD-10-CM | POA: Insufficient documentation

## 2023-06-30 DIAGNOSIS — J432 Centrilobular emphysema: Secondary | ICD-10-CM | POA: Diagnosis not present

## 2023-06-30 DIAGNOSIS — Z79890 Hormone replacement therapy (postmenopausal): Secondary | ICD-10-CM | POA: Diagnosis not present

## 2023-06-30 DIAGNOSIS — Z9221 Personal history of antineoplastic chemotherapy: Secondary | ICD-10-CM | POA: Insufficient documentation

## 2023-06-30 DIAGNOSIS — G8929 Other chronic pain: Secondary | ICD-10-CM | POA: Diagnosis not present

## 2023-06-30 DIAGNOSIS — C3490 Malignant neoplasm of unspecified part of unspecified bronchus or lung: Secondary | ICD-10-CM | POA: Diagnosis not present

## 2023-06-30 LAB — CBC WITH DIFFERENTIAL/PLATELET
Abs Immature Granulocytes: 0.02 10*3/uL (ref 0.00–0.07)
Basophils Absolute: 0 10*3/uL (ref 0.0–0.1)
Basophils Relative: 0 %
Eosinophils Absolute: 0.1 10*3/uL (ref 0.0–0.5)
Eosinophils Relative: 1 %
HCT: 40.2 % (ref 36.0–46.0)
Hemoglobin: 13.5 g/dL (ref 12.0–15.0)
Immature Granulocytes: 0 %
Lymphocytes Relative: 27 %
Lymphs Abs: 1.9 10*3/uL (ref 0.7–4.0)
MCH: 31.3 pg (ref 26.0–34.0)
MCHC: 33.6 g/dL (ref 30.0–36.0)
MCV: 93.3 fL (ref 80.0–100.0)
Monocytes Absolute: 0.6 10*3/uL (ref 0.1–1.0)
Monocytes Relative: 8 %
Neutro Abs: 4.5 10*3/uL (ref 1.7–7.7)
Neutrophils Relative %: 64 %
Platelets: 227 10*3/uL (ref 150–400)
RBC: 4.31 MIL/uL (ref 3.87–5.11)
RDW: 14.8 % (ref 11.5–15.5)
WBC: 7.1 10*3/uL (ref 4.0–10.5)
nRBC: 0 % (ref 0.0–0.2)

## 2023-06-30 LAB — TSH: TSH: 2.259 u[IU]/mL (ref 0.350–4.500)

## 2023-06-30 LAB — COMPREHENSIVE METABOLIC PANEL
ALT: 20 U/L (ref 0–44)
AST: 27 U/L (ref 15–41)
Albumin: 3.8 g/dL (ref 3.5–5.0)
Alkaline Phosphatase: 71 U/L (ref 38–126)
Anion gap: 6 (ref 5–15)
BUN: 9 mg/dL (ref 8–23)
CO2: 25 mmol/L (ref 22–32)
Calcium: 8.8 mg/dL — ABNORMAL LOW (ref 8.9–10.3)
Chloride: 103 mmol/L (ref 98–111)
Creatinine, Ser: 1.06 mg/dL — ABNORMAL HIGH (ref 0.44–1.00)
GFR, Estimated: 58 mL/min — ABNORMAL LOW (ref >60)
Glucose, Bld: 95 mg/dL (ref 70–99)
Potassium: 3.6 mmol/L (ref 3.5–5.1)
Sodium: 134 mmol/L — ABNORMAL LOW (ref 135–145)
Total Bilirubin: 0.5 mg/dL (ref 0.3–1.2)
Total Protein: 7.4 g/dL (ref 6.5–8.1)

## 2023-06-30 MED ORDER — IOHEXOL 300 MG/ML  SOLN
75.0000 mL | Freq: Once | INTRAMUSCULAR | Status: AC | PRN
  Administered 2023-06-30: 60 mL via INTRAVENOUS

## 2023-07-05 ENCOUNTER — Other Ambulatory Visit (HOSPITAL_COMMUNITY): Payer: Self-pay | Admitting: Internal Medicine

## 2023-07-05 DIAGNOSIS — Z1231 Encounter for screening mammogram for malignant neoplasm of breast: Secondary | ICD-10-CM

## 2023-07-05 NOTE — Progress Notes (Signed)
Melissa Sullivan [address], [place] [address]    Clinic Day:  07/06/2023  Referring physician: Benita Stabile, MD  Patient Care Team: Benita Stabile, MD as PCP - General (Internal Medicine) West Bali, MD (Inactive) as Consulting Physician (Gastroenterology)   ASSESSMENT & PLAN:   Assessment: 1. Cancer of upper lobe of right lung [facility]) Metastatic adenocarcinoma of the lung to the bones and right adrenal: -Foundation 1 testing shows 14 genomic alterations, no approved therapies. -Opdivo 480 mg monthly started on February 25, 2015. -CT CAP on February 21, 2020 shows no evidence of progression or metastatic disease.  T2 sclerotic lesion is stable. -CTAP on 08/10/2020 with no evidence of recurrent metastatic disease in the abdomen or pelvis.  Irregular mildly enlarged right adrenal gland unchanged. -CT chest on 09/09/2020 showed partial clearing of the airspace opacities noted medially in the lingula and left lower lobe.  New irregular nodule posteriorly at the right apex likely inflammatory.  Stable sclerotic T2 vertebral body lesion. -Reviewed CT CAP from 02/19/2021 which showed stable to slight decrease in size of mediastinal lymph nodes.  Stable posttreatment changes in the left lung apex and slight decrease in size of the absent irregular nodule.  Stable sclerotic lesion in T2 vertebral body.  No new progressive findings.    Plan: 1.  Metastatic lung adenocarcinoma: - Last opdivo was on 12/29/2022.  She has been under monitoring since then. - She denies any recent respiratory infections. - Reviewed CT CAP from 03/23/2023: New superior segment left lower lobe nodular density, possibly mucoid impaction.  No other metastatic disease. - Labs: Normal LFTs and CBC. - Recommend follow-up in 3 months with repeat CT chest.   2.  High risk drug monitoring: - Continue Synthroid 25 mcg daily.  TSH is 2.315.   3.  Health maintenance: - Last mammogram was 07/18/2022, BI-RADS Category  1.  She will have another mammogram this August.   4.  CKD: - Creatinine is 1.23 and stable.   5.  Bone metastasis: - Bisphosphonates on hold due to ONJ in 2019.  6.  Chronic pains: - Continue oxycodone as needed.  7.  Weight loss: - Continue Marinol 2.5 mg twice daily.    No orders of the defined types were placed in this encounter.      Alben Deeds Teague,acting as a Neurosurgeon for Doreatha Massed, MD.,have documented all relevant documentation on the behalf of Doreatha Massed, MD,as directed by  Doreatha Massed, MD while in the presence of Doreatha Massed, MD.  ***   [place] R Teague   8/14/20248:48 PM  CHIEF COMPLAINT:   Diagnosis: metastatic right lung cancer    Cancer Staging  No matching staging information was found for the patient.    Prior Therapy: 1. Bevacizumab, cisplatin and pemetrexed x 4 cycles from 09/02/2014 to 11/04/2014. 2. Bevacizumab and pemetrexed x 3 cycles from 12/24/2014 to 02/04/2015. 3. Nivolumab every 4 weeks from 02/25/2015 to 12/29/2022  Current Therapy:  surveillance   HISTORY OF PRESENT ILLNESS:   Oncology History  Cancer of upper lobe of right lung (HCC)  07/28/2014 Imaging   CT chest: Large R apical mass consistent with malignancy. This is destroying the R 2nd rib with extension into adjacent soft tissue. R hilar adenopathy with R 5cm adrenal metastatic lesion.   08/01/2014 Initial Biopsy   Lung, needle/core biopsy(ies), right upper lobe - POORLY DIFFERENTIATED ADENOCARCINOMA, SEE COMMENT.   08/08/2014 PET scan   Large hypermetabolic R apical  mass with evidence of direct chest wall and mediastinal invasion, right retrocrural lymphadenopathy, extensive retroperitoneal lymphadenopathy, and metastatic lesions to the adrenal glands    09/02/2014 - 11/04/2014 Chemotherapy   Cisplatin/Pemetrexed/Avastin every 21 days x 4 cycles   10/07/2014 - 10/27/2014 Radiation Therapy   Right lung apex for control of brachioplexopathy.    12/24/2014 - 02/25/2015 Chemotherapy   Alimta/Avastin every 21 days.   02/20/2015 Imaging   Increase in size of right adrenal metastasis and subjacent confluent retrocaval lymphadenopathy   02/25/2015 -  Chemotherapy   Nivolumab, zometa   05/04/2015 Imaging   CT CAP- Stable to slight decrease in the posterior right apical lesion. Stable appearance of posterior right upper rib an upper thoracic bony lesions. Slight improvement in right upper lobe tree-in-bud opacity. No new or progressive findings in...   07/28/2015 Imaging   CT CAP- Reduced size of the right apical pleural parenchymal lesion and reduced size of the right adrenal metastatic lesion. Resolution of prior retrocrural adenopathy.  Right eccentric T1 and T2 sclerosis with sclerosis and tapering of the right second..   11/17/2015 Imaging   CT CAP- Stable soft tissue thickening in the apex of the right hemi thorax. Stable right adrenal metastasis. Nodularity along the trachea and mainstem bronchi, relatively new from 07/28/2015, favoring adherent debris.   11/18/2015 Treatment Plan Change   Zometa HELD for upcoming tooth extraction   11/24/2015 Treatment Plan Change   Zometa on hold at this time in preparation for tooth extraction in March 2017.  Zometa las given on 11/18/2015.   02/03/2016 Imaging   CT CAP- Heterogeneous right apical masslike consolidation and right adrenal metastasis are unchanged   04/06/2016 Treatment Plan Change   Zometa restarted 6 weeks out from tooth extraction (04/06/2016)   05/12/2016 Imaging   CT CAP- NED in the chest, abdomen or pelvis.  Some areas of nodularity associated with the mainstem bronchi in the left upper lobe bronchus, favored to represent adherent inspissated secretions   08/17/2016 Imaging   CT CAP- 1. Stable CTs of the chest and abdomen. No evidence of progressive metastatic disease. 2. Probable treated tumor at the right apex, right adrenal gland and T2 vertebral body, stable. 3. Fluctuating  nodularity along the walls of the trachea and mainstem bronchi, likely secretions.   12/12/2016 Imaging   Further decrease in size of treated tumor within the right apex. 2. Stable treated tumor involving the right second rib and T2 vertebra. 3. Stable right adrenal gland treated tumor. 4. Emphysema 5. Aortic atherosclerosis   01/04/2017 Imaging   MRI brain- Normal brain MRI.  No intracranial metastatic disease.   03/15/2017 Imaging   CT CAP- 1. No new or progressive metastatic disease in the chest or abdomen. 2. Stable treated tumor in the apical right upper lobe. Stable treated right posterior second rib and right T2 vertebral lesions. Stable treated right adrenal metastasis. 3. Aortic atherosclerosis. 4. Moderate emphysema with mild diffuse bronchial wall thickening, suggesting COPD.   06/12/2017 Imaging   CT CAP 1. Similar appearance of treated primary within the right apex. 2. Similar areas of sclerosis within the right second rib, T2, and less so T1 vertebral bodies. These are most consistent with treated metastasis. 3. Similar right adrenal treated metastasis. 4. No evidence of new or progressive disease. 5. Similar right and progressive left areas of bronchial wall thickening and probable mucoid impaction. Correlate with interval infectious symptoms. 6.  Emphysema (ICD10-J43.9). 7. Coronary artery atherosclerosis. Aortic Atherosclerosis (ICD10-I70.0).  10/16/2017 Imaging   CT CAP 1. Stable appearance of the prior Pancoast tumor and related bony findings in the right second rib and right T1 and T2 vertebra compatible with successfully treated tumor. No significant enlargement or new lesions are identified. Similarly the treated right adrenal metastatic lesion is stable in appearance. 2. Upper normal size right hilar lymph node may warrant surveillance. Currently 9 mm in short axis. 3. Other imaging findings of potential clinical significance:  Aortic Atherosclerosis (ICD10-I70.0) and Emphysema (ICD10-J43.9). Scattered proximal sigmoid colon diverticula. Mucus plugging medially in the left upper lobe and posteriorly in the right upper lobe.     11/29/2017 - 06/16/2022 Chemotherapy   Patient is on Treatment Plan : LUNG Nivolumab q28d     07/14/2022 -  Chemotherapy   Patient is on Treatment Plan : LUNG Nivolumab (480) q28d        INTERVAL HISTORY:   Melissa Sullivan is a 67 y.o. female presenting to clinic today for follow up of metastatic right lung cancer. She was last seen by me on 03/30/23.  Since her last visit, she underwent restaging CT chest on 06/30/23 that found: left lower lobe nodule has decreased in size in the interval, supporting treatment response and no additional evidence of metastatic disease.  Today, she states that she is doing well overall. Her appetite level is at ***%. Her energy level is at ***%.  PAST MEDICAL HISTORY:   Past Medical History: Past Medical History:  Diagnosis Date   Adrenal mass, right (HCC) 07/28/2014   Anemia    Bone metastases 04/05/2016   Cancer (HCC) 2015   lung  right   Diabetes mellitus without complication (HCC)    GERD (gastroesophageal reflux disease)    Hyperlipidemia    Hypertension    Hypothyroidism due to medication 01/30/2017   Lung mass 07/28/2014   Reflux     Surgical History: Past Surgical History:  Procedure Laterality Date   AMPUTATION Right 02/22/2017   Procedure: PARTIAL AMPUTATION RIGHT GREAT TOE;  Surgeon: Ferman Hamming, DPM;  Location: [facility];  Service: Podiatry;  Laterality: Right;   APPENDECTOMY     BIOPSY  09/22/2020   Procedure: BIOPSY;  Surgeon: Lanelle Bal, DO;  Location: [facility];  Service: Endoscopy;;   COLONOSCOPY WITH PROPOFOL N/A 09/22/2020   non-bleeding internal hemorrhoids, sigmoid and descending colon diverticulosis, localized mild inflammation in sigmoid that was felt due to prep artifact.    ESOPHAGOGASTRODUODENOSCOPY N/A 12/09/2014    WUJ:WJXBJY esophageal stricture/mild-to-noderate erosive gastritis. negative H.pylori   FLEXIBLE SIGMOIDOSCOPY  2011   Dr. Darrick Penna: hyperplastic polyp   LUNG BIOPSY Right 07/2014   CT guided   MALONEY DILATION N/A 12/09/2014   Procedure: MALONEY DILATION;  Surgeon: West Bali, MD;  Location: [facility];  Service: Endoscopy;  Laterality: N/A;   PORTACATH PLACEMENT Left 09/01/2014   SAVORY DILATION N/A 12/09/2014   Procedure: SAVORY DILATION;  Surgeon: West Bali, MD;  Location: [facility];  Service: Endoscopy;  Laterality: N/A;    Social History: Social History   Socioeconomic History   Marital status: Married    Spouse name: Not on file   Number of children: Not on file   Years of education: Not on file   Highest education level: Not on file  Occupational History   Not on file  Tobacco Use   Smoking status: Light Smoker    Current packs/day: 0.30    Average packs/day: 0.3 packs/day for 20.0 years (6.0 ttl pk-yrs)  Types: Cigarettes   Smokeless tobacco: Never  Vaping Use   Vaping status: Never Used  Substance and Sexual Activity   Alcohol use: No   Drug use: No   Sexual activity: Not on file  Other Topics Concern   Not on file  Social History Narrative   Not on file   Social Determinants of Health   Financial Resource Strain: Low Risk  (12/03/2020)   Overall Financial Resource Strain (CARDIA)    Difficulty of Paying Living Expenses: Not hard at all  Food Insecurity: No Food Insecurity (12/03/2020)   Hunger Vital Sign    Worried About Running Out of Food in the Last Year: Never true    Ran Out of Food in the Last Year: Never true  Transportation Needs: No Transportation Needs (12/03/2020)   PRAPARE - Administrator, Civil Service (Medical): No    Lack of Transportation (Non-Medical): No  Physical Activity: Not on file  Stress: No Stress Concern Present (12/03/2020)   [employer] of Occupational Health - Occupational Stress Questionnaire     Feeling of Stress : Only a little  Social Connections: Moderately Isolated (12/03/2020)   Social Connection and Isolation Panel [NHANES]    Frequency of Communication with Friends and Family: More than three times a week    Frequency of Social Gatherings with Friends and Family: More than three times a week    Attends Religious Services: Never    Database administrator or Organizations: No    Attends Banker Meetings: Never    Marital Status: Married  Catering manager Violence: Not At Risk (12/03/2020)   Humiliation, Afraid, Rape, and Kick questionnaire    Fear of Current or Ex-Partner: No    Emotionally Abused: No    Physically Abused: No    Sexually Abused: No    Family History: Family History  Problem Relation Age of Onset   Cancer Sister    Colon cancer Neg Hx     Current Medications:  Current Outpatient Medications:    albuterol (VENTOLIN HFA) 108 (90 Base) MCG/ACT inhaler, Inhale 2 puffs into the lungs every 6 (six) hours as needed for wheezing or shortness of breath., Disp: , Rfl:    ENSURE (ENSURE), Take 1 Can by mouth 4 (four) times daily., Disp: , Rfl:    Ferrous Sulfate (IRON) 325 (65 Fe) MG TABS, Take 325 mg by mouth daily. , Disp: , Rfl:    fluticasone (FLONASE) 50 MCG/ACT nasal spray, fluticasone propionate 50 mcg/actuation nasal spray,suspension, Disp: , Rfl:    Fluticasone-Salmeterol (ADVAIR DISKUS) 500-50 MCG/DOSE AEPB, Inhale 1 puff into the lungs 2 (two) times daily., Disp: 60 each, Rfl: 6   levocetirizine (XYZAL) 5 MG tablet, Take 5 mg by mouth every evening. , Disp: , Rfl:    levothyroxine (SYNTHROID) 25 MCG tablet, TAKE 1/2 (ONE-HALF) TABLET BY MOUTH ONCE DAILY BEFORE BREAKFAST, Disp: 45 tablet, Rfl: 3   lidocaine-prilocaine (EMLA) cream, Apply 1 Application topically as needed (port access)., Disp: 30 g, Rfl: 3   naloxone (NARCAN) nasal spray 4 mg/0.1 mL, , Disp: , Rfl:    omeprazole (PRILOSEC) 40 MG capsule, Take 1 capsule (40 mg total) by mouth  daily before breakfast., Disp: 30 capsule, Rfl: 5   Oxycodone HCl 10 MG TABS, Take 1 tablet (10 mg total) by mouth every 12 (twelve) hours as needed (pain.)., Disp: 60 tablet, Rfl: 0   promethazine (PHENERGAN) 6.25 MG/5ML syrup, every 4 (four) hours as needed., Disp: ,  Rfl:    rosuvastatin (CRESTOR) 20 MG tablet, Take 20 mg by mouth daily., Disp: , Rfl:    simvastatin (ZOCOR) 40 MG tablet, simvastatin 40 mg tablet  TAKE 1 TABLET BY MOUTH ONCE DAILY, Disp: , Rfl:  No current facility-administered medications for this visit.  Facility-Administered Medications Ordered in Other Visits:    sodium chloride flush (NS) 0.9 % injection 10 mL, 10 mL, Intracatheter, PRN, Doreatha Massed, MD, 10 mL at 01/03/19 0930   Allergies: Allergies  Allergen Reactions   Xgeva [Denosumab]     osteonecrosis    REVIEW OF SYSTEMS:   Review of Systems  Constitutional:  Negative for chills, fatigue and fever.  HENT:   Negative for lump/mass, mouth sores, nosebleeds, sore throat and trouble swallowing.   Eyes:  Negative for eye problems.  Respiratory:  Negative for cough and shortness of breath.   Cardiovascular:  Negative for chest pain, leg swelling and palpitations.  Gastrointestinal:  Negative for abdominal pain, constipation, diarrhea, nausea and vomiting.  Genitourinary:  Negative for bladder incontinence, difficulty urinating, dysuria, frequency, hematuria and nocturia.   Musculoskeletal:  Negative for arthralgias, back pain, flank pain, myalgias and neck pain.  Skin:  Negative for itching and rash.  Neurological:  Negative for dizziness, headaches and numbness.  Hematological:  Does not bruise/bleed easily.  Psychiatric/Behavioral:  Negative for depression, sleep disturbance and suicidal ideas. The patient is not nervous/anxious.   All other systems reviewed and are negative.    VITALS:   There were no vitals taken for this visit.  Wt Readings from Last 3 Encounters:  04/27/23 94 lb 6.4 oz  (42.8 kg)  03/30/23 91 lb 14.4 oz (41.7 kg)  12/29/22 99 lb 14.1 oz (45.3 kg)    There is no height or weight on file to calculate BMI.  Performance status (ECOG): 1 - Symptomatic but completely ambulatory  PHYSICAL EXAM:   Physical Exam Vitals and nursing note reviewed. Exam conducted with a chaperone present.  Constitutional:      Appearance: Normal appearance.  Cardiovascular:     Rate and Rhythm: Normal rate and regular rhythm.     Pulses: Normal pulses.     Heart sounds: Normal heart sounds.  Pulmonary:     Effort: Pulmonary effort is normal.     Breath sounds: Normal breath sounds.  Abdominal:     Palpations: Abdomen is soft. There is no hepatomegaly, splenomegaly or mass.     Tenderness: There is no abdominal tenderness.  Musculoskeletal:     Right lower leg: No edema.     Left lower leg: No edema.  Lymphadenopathy:     Cervical: No cervical adenopathy.     Right cervical: No superficial, deep or posterior cervical adenopathy.    Left cervical: No superficial, deep or posterior cervical adenopathy.     Upper Body:     Right upper body: No supraclavicular or axillary adenopathy.     Left upper body: No supraclavicular or axillary adenopathy.  Neurological:     General: No focal deficit present.     Mental Status: She is alert and oriented to person, place, and time.  Psychiatric:        Mood and Affect: Mood normal.        Behavior: Behavior normal.     LABS:      Latest Ref Rng & Units 06/30/2023    9:21 AM 03/23/2023    8:42 AM 12/29/2022   11:21 AM  CBC  WBC 4.0 -  10.5 K/uL 7.1  9.8  9.3   Hemoglobin 12.0 - 15.0 g/dL 13.0  86.5  78.4   Hematocrit 36.0 - 46.0 % 40.2  43.1  40.2   Platelets 150 - 400 K/uL 227  254  260       Latest Ref Rng & Units 06/30/2023    9:21 AM 03/23/2023    8:42 AM 12/29/2022   11:21 AM  CMP  Glucose 70 - 99 mg/dL 95  696  87   BUN 8 - 23 mg/dL 9  13  15    Creatinine 0.44 - 1.00 mg/dL 2.95  2.84  1.32   Sodium 135 - 145 mmol/L 134   137  136   Potassium 3.5 - 5.1 mmol/L 3.6  4.0  4.0   Chloride 98 - 111 mmol/L 103  102  103   CO2 22 - 32 mmol/L 25  24  24    Calcium 8.9 - 10.3 mg/dL 8.8  8.9  9.3   Total Protein 6.5 - 8.1 g/dL 7.4  7.7  7.5   Total Bilirubin 0.3 - 1.2 mg/dL 0.5  1.1  0.5   Alkaline Phos 38 - 126 U/L 71  72  62   AST 15 - 41 U/L 27  30  21    ALT 0 - 44 U/L 20  21  17       No results found for: "CEA1", "CEA" / No results found for: "CEA1", "CEA" No results found for: "PSA1" No results found for: "GMW102" No results found for: "CAN125"  No results found for: "TOTALPROTELP", "ALBUMINELP", "A1GS", "A2GS", "BETS", "BETA2SER", "GAMS", "MSPIKE", "SPEI" Lab Results  Component Value Date   TIBC 286 12/02/2021   TIBC 274 02/17/2016   TIBC 228 (L) 01/15/2015   FERRITIN 28 12/02/2021   FERRITIN 115 02/17/2016   FERRITIN 800 (H) 01/15/2015   IRONPCTSAT 17 12/02/2021   IRONPCTSAT 26 02/17/2016   IRONPCTSAT 68 (H) 01/15/2015   Lab Results  Component Value Date   LDH 119 06/16/2022   LDH 158 01/27/2022   LDH 124 01/27/2022     STUDIES:   CT Chest W Contrast  Result Date: 07/05/2023 CLINICAL DATA:  Non-small cell lung cancer, metastatic, ongoing chemotherapy, status post radiation therapy. * Tracking Code: BO * EXAM: CT CHEST WITH CONTRAST TECHNIQUE: Multidetector CT imaging of the chest was performed during intravenous contrast administration. RADIATION DOSE REDUCTION: This exam was performed according to the departmental dose-optimization program which includes automated exposure control, adjustment of the mA and/or kV according to patient size and/or use of iterative reconstruction technique. CONTRAST:  60mL OMNIPAQUE IOHEXOL 300 MG/ML  SOLN COMPARISON:  03/23/2023. FINDINGS: Cardiovascular: Left IJ Port-A-Cath terminates in the SVC. Atherosclerotic calcification of the aorta. Heart size normal. No pericardial effusion. Mediastinum/Nodes: No pathologically enlarged mediastinal, hilar or axillary lymph  nodes. Esophagus is grossly unremarkable. Lungs/Pleura: Centrilobular and paraseptal emphysema. Post treatment scarring and consolidation in the apical right upper lobe. Minimal peribronchovascular scarring in the medial left upper lobe (4/29). Scattered mucoid impaction. 4 mm superior segment left lower lobe nodule (4/53), decreased in size from 7 mm on 03/23/2023. No new pulmonary nodules. No pleural fluid. Adherent debris in the airway. Upper Abdomen: Mild blush of hyperattenuation in the inferior right hepatic lobe, likely a perfusion anomaly. Visualized portion of the liver is otherwise grossly unremarkable. Right adrenal calcifications. Visualized portions of the left adrenal gland, kidneys, spleen, pancreas and stomach are otherwise grossly unremarkable. No upper abdominal adenopathy. Musculoskeletal: Degenerative changes in the  spine. Old T2 compression fracture and sclerosis. IMPRESSION: 1. Left lower lobe nodule has decreased in size in the interval, supporting treatment response. No additional evidence of metastatic disease. 2.  Aortic atherosclerosis (ICD10-I70.0). 3.  Emphysema (ICD10-J43.9). Electronically Signed   By: Leanna Battles M.D.   On: 07/05/2023 13:30

## 2023-07-06 ENCOUNTER — Other Ambulatory Visit: Payer: Self-pay | Admitting: *Deleted

## 2023-07-06 ENCOUNTER — Encounter: Payer: Self-pay | Admitting: Hematology

## 2023-07-06 ENCOUNTER — Other Ambulatory Visit: Payer: Self-pay

## 2023-07-06 ENCOUNTER — Inpatient Hospital Stay (HOSPITAL_BASED_OUTPATIENT_CLINIC_OR_DEPARTMENT_OTHER): Payer: Medicare HMO | Admitting: Hematology

## 2023-07-06 VITALS — BP 126/70 | HR 104 | Temp 98.3°F | Resp 18 | Ht 59.0 in | Wt 91.5 lb

## 2023-07-06 DIAGNOSIS — R0781 Pleurodynia: Secondary | ICD-10-CM | POA: Diagnosis not present

## 2023-07-06 DIAGNOSIS — R0789 Other chest pain: Secondary | ICD-10-CM | POA: Diagnosis not present

## 2023-07-06 DIAGNOSIS — R634 Abnormal weight loss: Secondary | ICD-10-CM | POA: Diagnosis not present

## 2023-07-06 DIAGNOSIS — C3411 Malignant neoplasm of upper lobe, right bronchus or lung: Secondary | ICD-10-CM

## 2023-07-06 DIAGNOSIS — Z923 Personal history of irradiation: Secondary | ICD-10-CM | POA: Diagnosis not present

## 2023-07-06 DIAGNOSIS — Z79891 Long term (current) use of opiate analgesic: Secondary | ICD-10-CM | POA: Diagnosis not present

## 2023-07-06 DIAGNOSIS — Z79890 Hormone replacement therapy (postmenopausal): Secondary | ICD-10-CM | POA: Diagnosis not present

## 2023-07-06 DIAGNOSIS — Z9221 Personal history of antineoplastic chemotherapy: Secondary | ICD-10-CM | POA: Diagnosis not present

## 2023-07-06 DIAGNOSIS — G8929 Other chronic pain: Secondary | ICD-10-CM | POA: Diagnosis not present

## 2023-07-06 DIAGNOSIS — E039 Hypothyroidism, unspecified: Secondary | ICD-10-CM | POA: Diagnosis not present

## 2023-07-06 DIAGNOSIS — F17210 Nicotine dependence, cigarettes, uncomplicated: Secondary | ICD-10-CM | POA: Diagnosis not present

## 2023-07-06 MED ORDER — OXYCODONE HCL 10 MG PO TABS
10.0000 mg | ORAL_TABLET | Freq: Two times a day (BID) | ORAL | 0 refills | Status: DC | PRN
Start: 2023-07-06 — End: 2023-09-04

## 2023-07-06 NOTE — Patient Instructions (Addendum)
[facility] at [facility] Discharge Instructions   You were seen and examined today by Dr. Ellin Saba.  He reviewed the results of your lab work which are normal/stable.   He reviewed the results of your CT scan which is normal/stable.   We sent a refill for oxycodone to your pharmacy. Use as needed for pain.  We will see you back in 4 months. We will repeat lab work and a scan prior to this visit.   Return as scheduled.    Thank you for choosing [facility] at [facility] to provide your oncology and hematology care.  To afford each patient quality time with our provider, please arrive at least 15 minutes before your scheduled appointment time.   If you have a lab appointment with the Cancer Center please come in thru the Main Entrance and check in at the main information desk.  You need to re-schedule your appointment should you arrive 10 or more minutes late.  We strive to give you quality time with our providers, and arriving late affects you and other patients whose appointments are after yours.  Also, if you no show three or more times for appointments you may be dismissed from the clinic at the providers discretion.     Again, thank you for choosing [facility].  Our hope is that these requests will decrease the amount of time that you wait before being seen by our physicians.       _____________________________________________________________  Should you have questions after your visit to [facility], please contact our office at [phone] and follow the prompts.  Our office hours are 8:00 a.m. and 4:30 p.m. Monday - Friday.  Please note that voicemails left after 4:00 p.m. may not be returned until the following business day.  We are closed weekends and major holidays.  You do have access to a nurse 24-7, just call the main number to the clinic [phone] and do not press any options, hold on the line and  a nurse will answer the phone.    For prescription refill requests, have your pharmacy contact our office and allow 72 hours.    Due to Covid, you will need to wear a mask upon entering the hospital. If you do not have a mask, a mask will be given to you at the Main Entrance upon arrival. For doctor visits, patients may have 1 support person age 14 or older with them. For treatment visits, patients can not have anyone with them due to social distancing guidelines and our immunocompromised population.

## 2023-07-08 ENCOUNTER — Other Ambulatory Visit: Payer: Self-pay

## 2023-07-13 DIAGNOSIS — B351 Tinea unguium: Secondary | ICD-10-CM | POA: Diagnosis not present

## 2023-07-13 DIAGNOSIS — G629 Polyneuropathy, unspecified: Secondary | ICD-10-CM | POA: Diagnosis not present

## 2023-07-21 ENCOUNTER — Ambulatory Visit (HOSPITAL_COMMUNITY)
Admission: RE | Admit: 2023-07-21 | Discharge: 2023-07-21 | Disposition: A | Discharge status: Home / Self Care | Payer: Medicare HMO | Source: Ambulatory Visit | Attending: Internal Medicine | Admitting: Internal Medicine

## 2023-07-21 ENCOUNTER — Encounter (HOSPITAL_COMMUNITY): Payer: Self-pay

## 2023-07-21 DIAGNOSIS — Z1231 Encounter for screening mammogram for malignant neoplasm of breast: Secondary | ICD-10-CM | POA: Diagnosis not present

## 2023-09-04 ENCOUNTER — Other Ambulatory Visit: Payer: Self-pay | Admitting: *Deleted

## 2023-09-04 ENCOUNTER — Other Ambulatory Visit: Payer: Self-pay | Admitting: Gastroenterology

## 2023-09-04 ENCOUNTER — Telehealth: Payer: Self-pay | Admitting: *Deleted

## 2023-09-04 DIAGNOSIS — C3411 Malignant neoplasm of upper lobe, right bronchus or lung: Secondary | ICD-10-CM

## 2023-09-04 DIAGNOSIS — K219 Gastro-esophageal reflux disease without esophagitis: Secondary | ICD-10-CM

## 2023-09-04 DIAGNOSIS — E032 Hypothyroidism due to medicaments and other exogenous substances: Secondary | ICD-10-CM

## 2023-09-04 MED ORDER — OXYCODONE HCL 10 MG PO TABS
10.0000 mg | ORAL_TABLET | Freq: Two times a day (BID) | ORAL | 0 refills | Status: DC | PRN
Start: 2023-09-04 — End: 2024-01-08

## 2023-09-04 MED ORDER — OMEPRAZOLE 40 MG PO CPDR
40.0000 mg | DELAYED_RELEASE_CAPSULE | Freq: Every day | ORAL | 3 refills | Status: DC
Start: 2023-09-04 — End: 2023-10-26

## 2023-09-04 MED ORDER — LEVOTHYROXINE SODIUM 25 MCG PO TABS: ORAL_TABLET | ORAL | 3 refills | Status: AC

## 2023-09-04 NOTE — Telephone Encounter (Signed)
Received letter form [facility] to change prescription and for a refill on omeprazole 40mg . You have to sign the letter. Pt last OV 05/17/2023. Place letter in your box.

## 2023-09-04 NOTE — Telephone Encounter (Signed)
Filled out and returned to [facility]. I have also sent Rx in electronically.

## 2023-09-04 NOTE — Telephone Encounter (Signed)
Noted  

## 2023-09-28 DIAGNOSIS — G629 Polyneuropathy, unspecified: Secondary | ICD-10-CM | POA: Diagnosis not present

## 2023-09-28 DIAGNOSIS — B351 Tinea unguium: Secondary | ICD-10-CM | POA: Diagnosis not present

## 2023-10-05 ENCOUNTER — Encounter: Payer: Self-pay | Admitting: Gastroenterology

## 2023-10-25 LAB — LAB REPORT - SCANNED
A1c: 5.3
Albumin, Urine POC: 287.4
Creatinine, POC: 177.5 mg/dL
EGFR: 51
Microalb Creat Ratio: 162

## 2023-10-25 NOTE — Progress Notes (Unsigned)
Referring Provider: Benita Stabile, MD Primary Care Physician:  Benita Stabile, MD Primary GI Physician: Dr. Marletta Lor  Chief Complaint  Patient presents with   Follow-up    Follow up. No problems     HPI:   Melissa Sullivan is a 67 y.o. female with history of stage IV lung adenocarcinoma to the bones and right adrenal, HTN, HLD, hypothyroidism, diabetes, GERD, distal esophageal stricture s/p dilation January 2016, internal hemorrhoids, hyperplastic colon polyps, presenting today for routine 6 follow-up on GERD.  Last seen in the office 04/27/2023.  GERD was well-controlled on omeprazole 40 mg daily.  Noted patient had lost about 8 pounds between February and May 2024, but since had gained about 3 pounds back.  Reported her appetite was not as great as she was no longer getting dronabinol due to [facility] having trouble getting this medication.  Stated Dr. Margo Aye had discussed other options with her, but she had not started any medications yet as she had not made her decision on this.  She had no other significant GI symptoms. Reported she had increased her oral intake to 3 meals a day and 3 ensure daily.  Recommended that she continue to monitor her weight and let me know if she lost an additional 5 pounds.  Today:  Reports GERD is well-controlled on omeprazole 40 mg daily.  No abdominal pain, nausea, vomiting, dysphagia.  Bowels are moving well without BRBPR or melena.  We discussed that she has continued to lose weight.  She reports that she was doing well previously with dronabinol, but has not been able to get this filled in quite some time as [facility] is no longer caring it.  She does have a new pharmacy, [facility], and states that she is waiting for Dr. Ellin Saba to send a refill in to her new pharmacy.  She does report early satiety.  States this is a chronic symptom for him and previously been improved with dronabinol.    Past Medical History:  Diagnosis Date   Adrenal mass, right (HCC)  07/28/2014   Anemia    Bone metastases 04/05/2016   Cancer (HCC) 2015   lung  right   Diabetes mellitus without complication (HCC)    GERD (gastroesophageal reflux disease)    Hyperlipidemia    Hypertension    Hypothyroidism due to medication 01/30/2017   Lung mass 07/28/2014   Reflux     Past Surgical History:  Procedure Laterality Date   AMPUTATION Right 02/22/2017   Procedure: PARTIAL AMPUTATION RIGHT GREAT TOE;  Surgeon: Ferman Hamming, DPM;  Location: [facility];  Service: Podiatry;  Laterality: Right;   APPENDECTOMY     BIOPSY  09/22/2020   Procedure: BIOPSY;  Surgeon: Lanelle Bal, DO;  Location: [facility];  Service: Endoscopy;;   COLONOSCOPY WITH PROPOFOL N/A 09/22/2020   non-bleeding internal hemorrhoids, sigmoid and descending colon diverticulosis, localized mild inflammation in sigmoid that was felt due to prep artifact.    ESOPHAGOGASTRODUODENOSCOPY N/A 12/09/2014   [id number] esophageal stricture/mild-to-noderate erosive gastritis. negative H.pylori   FLEXIBLE SIGMOIDOSCOPY  2011   Dr. Darrick Penna: hyperplastic polyp   LUNG BIOPSY Right 07/2014   CT guided   MALONEY DILATION N/A 12/09/2014   Procedure: MALONEY DILATION;  Surgeon: West Bali, MD;  Location: [facility];  Service: Endoscopy;  Laterality: N/A;   PORTACATH PLACEMENT Left 09/01/2014   SAVORY DILATION N/A 12/09/2014   Procedure: SAVORY DILATION;  Surgeon: West Bali, MD;  Location: [facility];  Service: Endoscopy;  Laterality: N/A;    Current Outpatient Medications  Medication Sig Dispense Refill   albuterol (VENTOLIN HFA) 108 (90 Base) MCG/ACT inhaler Inhale 2 puffs into the lungs every 6 (six) hours as needed for wheezing or shortness of breath.     ENSURE (ENSURE) Take 1 Can by mouth 4 (four) times daily.     Ferrous Sulfate (IRON) 325 (65 Fe) MG TABS Take 325 mg by mouth daily.      fluticasone (FLONASE) 50 MCG/ACT nasal spray fluticasone propionate 50 mcg/actuation nasal spray,suspension      Fluticasone-Salmeterol (ADVAIR DISKUS) 500-50 MCG/DOSE AEPB Inhale 1 puff into the lungs 2 (two) times daily. 60 each 6   levocetirizine (XYZAL) 5 MG tablet Take 5 mg by mouth every evening.      levothyroxine (SYNTHROID) 25 MCG tablet TAKE 1/2 (ONE-HALF) TABLET BY MOUTH ONCE DAILY BEFORE BREAKFAST 45 tablet 3   lidocaine-prilocaine (EMLA) cream Apply 1 Application topically as needed (port access). 30 g 3   nicotine (NICODERM CQ - DOSED IN MG/24 HR) 7 mg/24hr patch Place 2 mg onto the skin daily.     Oxycodone HCl 10 MG TABS Take 1 tablet (10 mg total) by mouth every 12 (twelve) hours as needed (pain.). 60 tablet 0   promethazine (PHENERGAN) 6.25 MG/5ML syrup every 4 (four) hours as needed.     rosuvastatin (CRESTOR) 20 MG tablet Take 20 mg by mouth daily.     simvastatin (ZOCOR) 40 MG tablet simvastatin 40 mg tablet  TAKE 1 TABLET BY MOUTH ONCE DAILY     chlorhexidine (PERIDEX) 0.12 % solution Use as directed 5 mLs in the mouth or throat 2 (two) times daily.     naloxone (NARCAN) nasal spray 4 mg/0.1 mL      omeprazole (PRILOSEC) 40 MG capsule Take 1 capsule (40 mg total) by mouth daily before breakfast. 90 capsule 3   No current facility-administered medications for this visit.   Facility-Administered Medications Ordered in Other Visits  Medication Dose Route Frequency Provider Last Rate Last Admin   sodium chloride flush (NS) 0.9 % injection 10 mL  10 mL Intracatheter PRN Doreatha Massed, MD   10 mL at 01/03/19 0930    Allergies as of 10/26/2023 - Review Complete 10/26/2023  Allergen Reaction Noted   Rivka Barbara [denosumab]  08/15/2018    Family History  Problem Relation Age of Onset   Cancer Sister    Colon cancer Neg Hx     Social History   Socioeconomic History   Marital status: Married    Spouse name: Not on file   Number of children: Not on file   Years of education: Not on file   Highest education level: Not on file  Occupational History   Not on file  Tobacco Use    Smoking status: Light Smoker    Current packs/day: 0.30    Average packs/day: 0.3 packs/day for 20.0 years (6.0 ttl pk-yrs)    Types: Cigarettes   Smokeless tobacco: Never  Vaping Use   Vaping status: Never Used  Substance and Sexual Activity   Alcohol use: No   Drug use: No   Sexual activity: Not on file  Other Topics Concern   Not on file  Social History Narrative   Not on file   Social Determinants of Health   Financial Resource Strain: Low Risk  (12/03/2020)   Overall Financial Resource Strain (CARDIA)    Difficulty of Paying Living Expenses: Not hard at  all  Food Insecurity: No Food Insecurity (12/03/2020)   Hunger Vital Sign    Worried About Running Out of Food in the Last Year: Never true    Ran Out of Food in the Last Year: Never true  Transportation Needs: No Transportation Needs (12/03/2020)   PRAPARE - Administrator, Civil Service (Medical): No    Lack of Transportation (Non-Medical): No  Physical Activity: Not on file  Stress: No Stress Concern Present (12/03/2020)   [employer] of Occupational Health - Occupational Stress Questionnaire    Feeling of Stress : Only a little  Social Connections: Moderately Isolated (12/03/2020)   Social Connection and Isolation Panel [NHANES]    Frequency of Communication with Friends and Family: More than three times a week    Frequency of Social Gatherings with Friends and Family: More than three times a week    Attends Religious Services: Never    Database administrator or Organizations: No    Attends Engineer, structural: Never    Marital Status: Married    Review of Systems: Gen: Denies fever, chills, cold or flulike symptoms, presyncope, syncope. GI: See HPI. Heme: See HPI  Physical Exam: BP 136/71 (BP Location: Right Arm, Patient Position: Sitting, Cuff Size: Normal)   Pulse (!) 103   Temp 97.9 F (36.6 C) (Temporal)   Ht 4\' 11"  (1.499 m)   Wt 87 lb (39.5 kg)   BMI 17.57 kg/m  General:    Alert and oriented. No distress noted. Pleasant and cooperative.  Head:  Normocephalic and atraumatic. Eyes:  Conjuctiva clear without scleral icterus. Abdomen:  +BS, soft, non-tender and non-distended. No rebound or guarding. No HSM or masses noted. Msk:  Symmetrical without gross deformities. Normal posture. Extremities:  Without edema. Neurologic:  Alert and  oriented x4 Psych:  Normal mood and affect.    Assessment:  67 y.o. female with history of stage IV lung adenocarcinoma to the bones and right adrenal, HTN, HLD, hypothyroidism, diabetes, GERD, distal esophageal stricture s/p dilation January 2016, internal hemorrhoids, hyperplastic colon polyps, presenting today for routine 6 follow-up on GERD. Also discussed weight loss.   GERD:  Well-controlled on omeprazole 40 mg daily.  Weight loss: Unintentional 12 pound weight loss over the last 10 months.  Patient reports this is secondary to not getting dronabinol since early 2024 as [facility] no longer carries this.  She has established with a new pharmacy, [facility], and states she is waiting on Dr. Ellin Saba to send in a prescription for her.  Notably, she also reports chronic history of early satiety that was previously resolved with dronabinol.  We discussed pursuing an upper endoscopy to further evaluate her symptoms, but she prefers to hold off on this to see if resuming dronabinol will allow her to eat well again and resolve weight loss. She also has upcoming CT chest abdomen pelvis with Dr. Ellin Saba on 11/06/23 which will help rule out cancer progression. Colonoscopy is up to date in 2021 and denies lower GI symptoms.     Plan:  Continue omeprazole 40 mg daily.  Refills sent to pharmacy. Encouraged patient to reach out to Dr. Ellin Saba today regarding sending in refill for dronabinol for appetite. Offered EGD, but patient declined. Requested patient to let me know if she continues with weight loss/early satiety after resuming  dronabinol or if she has any trouble obtaining dronabinol. Will plan to follow-up in 6 months or sooner if needed.   Ermalinda Memos, PA-C [facility] 10/26/2023

## 2023-10-26 ENCOUNTER — Encounter: Payer: Self-pay | Admitting: Gastroenterology

## 2023-10-26 ENCOUNTER — Ambulatory Visit: Payer: Medicare HMO | Admitting: Gastroenterology

## 2023-10-26 VITALS — BP 136/71 | HR 103 | Temp 97.9°F | Ht 59.0 in | Wt 87.0 lb

## 2023-10-26 DIAGNOSIS — R634 Abnormal weight loss: Secondary | ICD-10-CM

## 2023-10-26 DIAGNOSIS — Z8719 Personal history of other diseases of the digestive system: Secondary | ICD-10-CM | POA: Diagnosis not present

## 2023-10-26 DIAGNOSIS — K219 Gastro-esophageal reflux disease without esophagitis: Secondary | ICD-10-CM | POA: Diagnosis not present

## 2023-10-26 DIAGNOSIS — R6881 Early satiety: Secondary | ICD-10-CM

## 2023-10-26 MED ORDER — OMEPRAZOLE 40 MG PO CPDR: 40.0000 mg | DELAYED_RELEASE_CAPSULE | Freq: Every day | ORAL | 3 refills | Status: DC

## 2023-10-26 NOTE — Patient Instructions (Signed)
Continue omeprazole 40 mg daily.  Please call Dr. Marice Potter office today to see if he can send a refill of dronabinol to your new pharmacy so you can resume this medication.  If you have any trouble getting dronabinol, continue with weight loss after starting dronabinol, or change your mind about pursuing an upper endoscopy, please let me know.  I will plan to see you back in 6 months or sooner if needed.  Ermalinda Memos, PA-C [facility]

## 2023-10-27 DIAGNOSIS — J4520 Mild intermittent asthma, uncomplicated: Secondary | ICD-10-CM | POA: Diagnosis not present

## 2023-10-27 DIAGNOSIS — R946 Abnormal results of thyroid function studies: Secondary | ICD-10-CM | POA: Diagnosis not present

## 2023-10-27 DIAGNOSIS — I700 Atherosclerosis of aorta: Secondary | ICD-10-CM | POA: Diagnosis not present

## 2023-10-27 DIAGNOSIS — E782 Mixed hyperlipidemia: Secondary | ICD-10-CM | POA: Diagnosis not present

## 2023-10-27 DIAGNOSIS — Z23 Encounter for immunization: Secondary | ICD-10-CM | POA: Diagnosis not present

## 2023-10-27 DIAGNOSIS — R7301 Impaired fasting glucose: Secondary | ICD-10-CM | POA: Diagnosis not present

## 2023-10-27 DIAGNOSIS — I129 Hypertensive chronic kidney disease with stage 1 through stage 4 chronic kidney disease, or unspecified chronic kidney disease: Secondary | ICD-10-CM | POA: Diagnosis not present

## 2023-10-27 DIAGNOSIS — K219 Gastro-esophageal reflux disease without esophagitis: Secondary | ICD-10-CM | POA: Diagnosis not present

## 2023-10-27 DIAGNOSIS — D509 Iron deficiency anemia, unspecified: Secondary | ICD-10-CM | POA: Diagnosis not present

## 2023-10-27 DIAGNOSIS — N1831 Chronic kidney disease, stage 3a: Secondary | ICD-10-CM | POA: Diagnosis not present

## 2023-10-27 DIAGNOSIS — C3490 Malignant neoplasm of unspecified part of unspecified bronchus or lung: Secondary | ICD-10-CM | POA: Diagnosis not present

## 2023-11-06 ENCOUNTER — Inpatient Hospital Stay: Payer: Medicare HMO | Attending: Hematology

## 2023-11-06 ENCOUNTER — Other Ambulatory Visit: Payer: Self-pay | Admitting: *Deleted

## 2023-11-06 ENCOUNTER — Ambulatory Visit (HOSPITAL_COMMUNITY)
Admission: RE | Admit: 2023-11-06 | Discharge: 2023-11-06 | Disposition: A | Discharge status: Home / Self Care | Payer: Medicare HMO | Source: Ambulatory Visit | Attending: Hematology | Admitting: Hematology

## 2023-11-06 DIAGNOSIS — Z923 Personal history of irradiation: Secondary | ICD-10-CM | POA: Diagnosis not present

## 2023-11-06 DIAGNOSIS — Z9221 Personal history of antineoplastic chemotherapy: Secondary | ICD-10-CM | POA: Diagnosis not present

## 2023-11-06 DIAGNOSIS — F17210 Nicotine dependence, cigarettes, uncomplicated: Secondary | ICD-10-CM | POA: Diagnosis not present

## 2023-11-06 DIAGNOSIS — Z452 Encounter for adjustment and management of vascular access device: Secondary | ICD-10-CM | POA: Insufficient documentation

## 2023-11-06 DIAGNOSIS — Z79890 Hormone replacement therapy (postmenopausal): Secondary | ICD-10-CM | POA: Insufficient documentation

## 2023-11-06 DIAGNOSIS — G8929 Other chronic pain: Secondary | ICD-10-CM | POA: Insufficient documentation

## 2023-11-06 DIAGNOSIS — J984 Other disorders of lung: Secondary | ICD-10-CM | POA: Diagnosis not present

## 2023-11-06 DIAGNOSIS — C3411 Malignant neoplasm of upper lobe, right bronchus or lung: Secondary | ICD-10-CM

## 2023-11-06 DIAGNOSIS — R630 Anorexia: Secondary | ICD-10-CM

## 2023-11-06 DIAGNOSIS — E039 Hypothyroidism, unspecified: Secondary | ICD-10-CM | POA: Diagnosis not present

## 2023-11-06 DIAGNOSIS — Z79891 Long term (current) use of opiate analgesic: Secondary | ICD-10-CM | POA: Diagnosis not present

## 2023-11-06 DIAGNOSIS — K5730 Diverticulosis of large intestine without perforation or abscess without bleeding: Secondary | ICD-10-CM | POA: Diagnosis not present

## 2023-11-06 DIAGNOSIS — R634 Abnormal weight loss: Secondary | ICD-10-CM | POA: Diagnosis not present

## 2023-11-06 LAB — COMPREHENSIVE METABOLIC PANEL
ALT: 21 U/L (ref 0–44)
AST: 32 U/L (ref 15–41)
Albumin: 3.8 g/dL (ref 3.5–5.0)
Alkaline Phosphatase: 79 U/L (ref 38–126)
Anion gap: 9 (ref 5–15)
BUN: 11 mg/dL (ref 8–23)
CO2: 23 mmol/L (ref 22–32)
Calcium: 9.1 mg/dL (ref 8.9–10.3)
Chloride: 103 mmol/L (ref 98–111)
Creatinine, Ser: 1.11 mg/dL — ABNORMAL HIGH (ref 0.44–1.00)
GFR, Estimated: 54 mL/min — ABNORMAL LOW (ref >60)
Glucose, Bld: 121 mg/dL — ABNORMAL HIGH (ref 70–99)
Potassium: 3.4 mmol/L — ABNORMAL LOW (ref 3.5–5.1)
Sodium: 135 mmol/L (ref 135–145)
Total Bilirubin: 0.5 mg/dL (ref <1.2)
Total Protein: 7.5 g/dL (ref 6.5–8.1)

## 2023-11-06 LAB — CBC WITH DIFFERENTIAL/PLATELET
Abs Immature Granulocytes: 0.03 10*3/uL (ref 0.00–0.07)
Basophils Absolute: 0 10*3/uL (ref 0.0–0.1)
Basophils Relative: 0 %
Eosinophils Absolute: 0.1 10*3/uL (ref 0.0–0.5)
Eosinophils Relative: 1 %
HCT: 38.7 % (ref 36.0–46.0)
Hemoglobin: 13 g/dL (ref 12.0–15.0)
Immature Granulocytes: 0 %
Lymphocytes Relative: 28 %
Lymphs Abs: 3.1 10*3/uL (ref 0.7–4.0)
MCH: 33.2 pg (ref 26.0–34.0)
MCHC: 33.6 g/dL (ref 30.0–36.0)
MCV: 98.7 fL (ref 80.0–100.0)
Monocytes Absolute: 0.8 10*3/uL (ref 0.1–1.0)
Monocytes Relative: 7 %
Neutro Abs: 7.2 10*3/uL (ref 1.7–7.7)
Neutrophils Relative %: 64 %
Platelets: 319 10*3/uL (ref 150–400)
RBC: 3.92 MIL/uL (ref 3.87–5.11)
RDW: 15.2 % (ref 11.5–15.5)
WBC: 11.3 10*3/uL — ABNORMAL HIGH (ref 4.0–10.5)
nRBC: 0 % (ref 0.0–0.2)

## 2023-11-06 LAB — TSH: TSH: 4.555 u[IU]/mL — ABNORMAL HIGH (ref 0.350–4.500)

## 2023-11-06 MED ORDER — HEPARIN SOD (PORK) LOCK FLUSH 100 UNIT/ML IV SOLN
500.0000 [IU] | Freq: Once | INTRAVENOUS | Status: AC
  Administered 2023-11-06: 500 [IU] via INTRAVENOUS

## 2023-11-06 MED ORDER — SODIUM CHLORIDE 0.9% FLUSH
10.0000 mL | Freq: Once | INTRAVENOUS | Status: AC
  Administered 2023-11-06: 10 mL via INTRAVENOUS

## 2023-11-06 MED ORDER — IOHEXOL 300 MG/ML  SOLN
100.0000 mL | Freq: Once | INTRAMUSCULAR | Status: AC | PRN
  Administered 2023-11-06: 100 mL via INTRAVENOUS

## 2023-11-06 NOTE — Progress Notes (Signed)
Patient here for port flush/lab. Patient's port flushed without difficulty No blood return noted. No swelling, no pain, no redness, and no discomfort while flushing.  Per pt her "port does not give blood and that doctor is aware." Labs drawn peripherally. Gauze dressing applied to port when deaccessed.  VSS with discharge and left in satisfactory condition with no s/s of distress noted. All follow ups as scheduled.   Melissa Sullivan [employer]

## 2023-11-13 ENCOUNTER — Inpatient Hospital Stay (HOSPITAL_BASED_OUTPATIENT_CLINIC_OR_DEPARTMENT_OTHER): Payer: Medicare HMO | Admitting: Hematology

## 2023-11-13 VITALS — BP 140/79 | HR 90 | Temp 98.1°F | Resp 18 | Wt 86.0 lb

## 2023-11-13 DIAGNOSIS — Z79890 Hormone replacement therapy (postmenopausal): Secondary | ICD-10-CM | POA: Diagnosis not present

## 2023-11-13 DIAGNOSIS — R634 Abnormal weight loss: Secondary | ICD-10-CM | POA: Diagnosis not present

## 2023-11-13 DIAGNOSIS — F17210 Nicotine dependence, cigarettes, uncomplicated: Secondary | ICD-10-CM | POA: Diagnosis not present

## 2023-11-13 DIAGNOSIS — E278 Other specified disorders of adrenal gland: Secondary | ICD-10-CM | POA: Diagnosis not present

## 2023-11-13 DIAGNOSIS — Z79891 Long term (current) use of opiate analgesic: Secondary | ICD-10-CM | POA: Diagnosis not present

## 2023-11-13 DIAGNOSIS — C3411 Malignant neoplasm of upper lobe, right bronchus or lung: Secondary | ICD-10-CM | POA: Diagnosis not present

## 2023-11-13 DIAGNOSIS — Z9221 Personal history of antineoplastic chemotherapy: Secondary | ICD-10-CM | POA: Diagnosis not present

## 2023-11-13 DIAGNOSIS — Z452 Encounter for adjustment and management of vascular access device: Secondary | ICD-10-CM | POA: Diagnosis not present

## 2023-11-13 DIAGNOSIS — G8929 Other chronic pain: Secondary | ICD-10-CM | POA: Diagnosis not present

## 2023-11-13 DIAGNOSIS — Z923 Personal history of irradiation: Secondary | ICD-10-CM | POA: Diagnosis not present

## 2023-11-13 DIAGNOSIS — E039 Hypothyroidism, unspecified: Secondary | ICD-10-CM | POA: Diagnosis not present

## 2023-11-13 NOTE — Progress Notes (Signed)
[facility] [address], [place] [address]    Clinic Day:  11/13/2023  Referring physician: Benita Stabile, MD  Patient Care Team: Benita Stabile, MD as PCP - General (Internal Medicine) West Bali, MD (Inactive) as Consulting Physician (Gastroenterology)   ASSESSMENT & PLAN:   Assessment: 1. Cancer of upper lobe of right lung [facility]) Metastatic adenocarcinoma of the lung to the bones and right adrenal: -Foundation 1 testing shows 14 genomic alterations, no approved therapies. -Opdivo 480 mg monthly started on February 25, 2015, stopped on 12/29/2022. -CT CAP on February 21, 2020 shows no evidence of progression or metastatic disease.  T2 sclerotic lesion is stable. -CTAP on 08/10/2020 with no evidence of recurrent metastatic disease in the abdomen or pelvis.  Irregular mildly enlarged right adrenal gland unchanged. -CT chest on 09/09/2020 showed partial clearing of the airspace opacities noted medially in the lingula and left lower lobe.  New irregular nodule posteriorly at the right apex likely inflammatory.  Stable sclerotic T2 vertebral body lesion. -Reviewed CT CAP from 02/19/2021 which showed stable to slight decrease in size of mediastinal lymph nodes.  Stable posttreatment changes in the left lung apex and slight decrease in size of the absent irregular nodule.  Stable sclerotic lesion in T2 vertebral body.  No new progressive findings.    Plan: 1.  Metastatic lung adenocarcinoma: - Last Opdivo on 12/29/2022.  She is on surveillance since then. - CT CAP on 11/06/2023: No evidence of recurrence or metastatic disease.  Other benign findings were discussed. - She reported headache and right eye floaters for the last 1 week.  She was told to call us if it does not get better in the next 2 weeks.  Will order brain MRI.  She has an appointment to see ophthalmology. - Recommend follow-up in 6 months with repeat CT CAP with contrast.   2.  High risk drug monitoring: -  Continue Synthroid 25 mcg daily.  TSH is 4.5.   3.  Weight loss: - She lost few pounds as she could not get Marinol since February of this year.  We have sent another prescription.  She reports that she will soon receive it in the mail and start it.  It has helped her in the past.   4.  Chronic pains: - Occasional pain in the right chest wall and ribs.  Continue oxycodone as needed.    Orders Placed This Encounter  Procedures   CT CHEST ABDOMEN PELVIS W CONTRAST    Standing Status:   Future    Expected Date:   05/13/2024    Expiration Date:   11/12/2024    If indicated for the ordered procedure, I authorize the administration of contrast media per Radiology protocol:   Yes    Does the patient have a contrast media/X-ray dye allergy?:   No    Preferred imaging location?:   [facility]    Release to patient:   Immediate    If indicated for the ordered procedure, I authorize the administration of oral contrast media per Radiology protocol:   Yes   CBC with Differential    Standing Status:   Future    Expected Date:   05/13/2024    Expiration Date:   11/12/2024   Comprehensive metabolic panel    Standing Status:   Future    Expected Date:   05/13/2024    Expiration Date:   11/12/2024   TSH  Standing Status:   Future    Expected Date:   05/13/2024    Expiration Date:   11/12/2024      I,Katie Daubenspeck,acting as a scribe for Doreatha Massed, MD.,have documented all relevant documentation on the behalf of Doreatha Massed, MD,as directed by  Doreatha Massed, MD while in the presence of Doreatha Massed, MD.   I, Doreatha Massed MD, have reviewed the above documentation for accuracy and completeness, and I agree with the above.   Doreatha Massed, MD   12/23/20241:16 PM  CHIEF COMPLAINT:   Diagnosis: metastatic right lung cancer    Cancer Staging  No matching staging information was found for the patient.    Prior Therapy: 1. Bevacizumab,  cisplatin and pemetrexed x 4 cycles from 09/02/2014 to 11/04/2014. 2. Bevacizumab and pemetrexed x 3 cycles from 12/24/2014 to 02/04/2015. 3. Nivolumab every 4 weeks from 02/25/2015 to 12/29/2022  Current Therapy:  surveillance    HISTORY OF PRESENT ILLNESS:   Oncology History  Cancer of upper lobe of right lung (HCC)  07/28/2014 Imaging   CT chest: Large R apical mass consistent with malignancy. This is destroying the R 2nd rib with extension into adjacent soft tissue. R hilar adenopathy with R 5cm adrenal metastatic lesion.   08/01/2014 Initial Biopsy   Lung, needle/core biopsy(ies), right upper lobe - POORLY DIFFERENTIATED ADENOCARCINOMA, SEE COMMENT.   08/08/2014 PET scan   Large hypermetabolic R apical mass with evidence of direct chest wall and mediastinal invasion, right retrocrural lymphadenopathy, extensive retroperitoneal lymphadenopathy, and metastatic lesions to the adrenal glands    09/02/2014 - 11/04/2014 Chemotherapy   Cisplatin/Pemetrexed/Avastin every 21 days x 4 cycles   10/07/2014 - 10/27/2014 Radiation Therapy   Right lung apex for control of brachioplexopathy.   12/24/2014 - 02/25/2015 Chemotherapy   Alimta/Avastin every 21 days.   02/20/2015 Imaging   Increase in size of right adrenal metastasis and subjacent confluent retrocaval lymphadenopathy   02/25/2015 -  Chemotherapy   Nivolumab, zometa   05/04/2015 Imaging   CT CAP- Stable to slight decrease in the posterior right apical lesion. Stable appearance of posterior right upper rib an upper thoracic bony lesions. Slight improvement in right upper lobe tree-in-bud opacity. No new or progressive findings in...   07/28/2015 Imaging   CT CAP- Reduced size of the right apical pleural parenchymal lesion and reduced size of the right adrenal metastatic lesion. Resolution of prior retrocrural adenopathy.  Right eccentric T1 and T2 sclerosis with sclerosis and tapering of the right second..   11/17/2015 Imaging   CT CAP- Stable  soft tissue thickening in the apex of the right hemi thorax. Stable right adrenal metastasis. Nodularity along the trachea and mainstem bronchi, relatively new from 07/28/2015, favoring adherent debris.   11/18/2015 Treatment Plan Change   Zometa HELD for upcoming tooth extraction   11/24/2015 Treatment Plan Change   Zometa on hold at this time in preparation for tooth extraction in March 2017.  Zometa las given on 11/18/2015.   02/03/2016 Imaging   CT CAP- Heterogeneous right apical masslike consolidation and right adrenal metastasis are unchanged   04/06/2016 Treatment Plan Change   Zometa restarted 6 weeks out from tooth extraction (04/06/2016)   05/12/2016 Imaging   CT CAP- NED in the chest, abdomen or pelvis.  Some areas of nodularity associated with the mainstem bronchi in the left upper lobe bronchus, favored to represent adherent inspissated secretions   08/17/2016 Imaging   CT CAP- 1. Stable CTs of the chest and abdomen. No  evidence of progressive metastatic disease. 2. Probable treated tumor at the right apex, right adrenal gland and T2 vertebral body, stable. 3. Fluctuating nodularity along the walls of the trachea and mainstem bronchi, likely secretions.   12/12/2016 Imaging   Further decrease in size of treated tumor within the right apex. 2. Stable treated tumor involving the right second rib and T2 vertebra. 3. Stable right adrenal gland treated tumor. 4. Emphysema 5. Aortic atherosclerosis   01/04/2017 Imaging   MRI brain- Normal brain MRI.  No intracranial metastatic disease.   03/15/2017 Imaging   CT CAP- 1. No new or progressive metastatic disease in the chest or abdomen. 2. Stable treated tumor in the apical right upper lobe. Stable treated right posterior second rib and right T2 vertebral lesions. Stable treated right adrenal metastasis. 3. Aortic atherosclerosis. 4. Moderate emphysema with mild diffuse bronchial wall thickening, suggesting COPD.   06/12/2017  Imaging   CT CAP 1. Similar appearance of treated primary within the right apex. 2. Similar areas of sclerosis within the right second rib, T2, and less so T1 vertebral bodies. These are most consistent with treated metastasis. 3. Similar right adrenal treated metastasis. 4. No evidence of new or progressive disease. 5. Similar right and progressive left areas of bronchial wall thickening and probable mucoid impaction. Correlate with interval infectious symptoms. 6.  Emphysema (ICD10-J43.9). 7. Coronary artery atherosclerosis. Aortic Atherosclerosis (ICD10-I70.0).     10/16/2017 Imaging   CT CAP 1. Stable appearance of the prior Pancoast tumor and related bony findings in the right second rib and right T1 and T2 vertebra compatible with successfully treated tumor. No significant enlargement or new lesions are identified. Similarly the treated right adrenal metastatic lesion is stable in appearance. 2. Upper normal size right hilar lymph node may warrant surveillance. Currently 9 mm in short axis. 3. Other imaging findings of potential clinical significance: Aortic Atherosclerosis (ICD10-I70.0) and Emphysema (ICD10-J43.9). Scattered proximal sigmoid colon diverticula. Mucus plugging medially in the left upper lobe and posteriorly in the right upper lobe.     11/29/2017 - 06/16/2022 Chemotherapy   Patient is on Treatment Plan : LUNG Nivolumab q28d     07/14/2022 -  Chemotherapy   Patient is on Treatment Plan : LUNG Nivolumab (480) q28d        INTERVAL HISTORY:   Melissa Sullivan is a 67 y.o. female presenting to clinic today for follow up of metastatic right lung cancer. She was last seen by me on 07/06/23.  Since her last visit, she underwent restaging CT C/A/P on 11/06/23 showing: no significant interval change; stable pleuroparenchymal opacity in right lung apex.  Today, she states that she is doing well overall. Her appetite level is at 10%. Her energy level is at 10%.  PAST  MEDICAL HISTORY:   Past Medical History: Past Medical History:  Diagnosis Date   Adrenal mass, right (HCC) 07/28/2014   Anemia    Bone metastases 04/05/2016   Cancer (HCC) 2015   lung  right   Diabetes mellitus without complication (HCC)    GERD (gastroesophageal reflux disease)    Hyperlipidemia    Hypertension    Hypothyroidism due to medication 01/30/2017   Lung mass 07/28/2014   Reflux     Surgical History: Past Surgical History:  Procedure Laterality Date   AMPUTATION Right 02/22/2017   Procedure: PARTIAL AMPUTATION RIGHT GREAT TOE;  Surgeon: Ferman Hamming, DPM;  Location: [facility];  Service: Podiatry;  Laterality: Right;   APPENDECTOMY     BIOPSY  09/22/2020   Procedure: BIOPSY;  Surgeon: Lanelle Bal, DO;  Location: [facility];  Service: Endoscopy;;   COLONOSCOPY WITH PROPOFOL N/A 09/22/2020   non-bleeding internal hemorrhoids, sigmoid and descending colon diverticulosis, localized mild inflammation in sigmoid that was felt due to prep artifact.    ESOPHAGOGASTRODUODENOSCOPY N/A 12/09/2014   EXB:MWUXLK esophageal stricture/mild-to-noderate erosive gastritis. negative H.pylori   FLEXIBLE SIGMOIDOSCOPY  2011   Dr. Darrick Penna: hyperplastic polyp   LUNG BIOPSY Right 07/2014   CT guided   MALONEY DILATION N/A 12/09/2014   Procedure: MALONEY DILATION;  Surgeon: West Bali, MD;  Location: [facility];  Service: Endoscopy;  Laterality: N/A;   PORTACATH PLACEMENT Left 09/01/2014   SAVORY DILATION N/A 12/09/2014   Procedure: SAVORY DILATION;  Surgeon: West Bali, MD;  Location: [facility];  Service: Endoscopy;  Laterality: N/A;    Social History: Social History   Socioeconomic History   Marital status: Married    Spouse name: Not on file   Number of children: Not on file   Years of education: Not on file   Highest education level: Not on file  Occupational History   Not on file  Tobacco Use   Smoking status: Light Smoker    Current packs/day: 0.30    Average  packs/day: 0.3 packs/day for 20.0 years (6.0 ttl pk-yrs)    Types: Cigarettes   Smokeless tobacco: Never  Vaping Use   Vaping status: Never Used  Substance and Sexual Activity   Alcohol use: No   Drug use: No   Sexual activity: Not on file  Other Topics Concern   Not on file  Social History Narrative   Not on file   Social Drivers of Health   Financial Resource Strain: Low Risk  (12/03/2020)   Overall Financial Resource Strain (CARDIA)    Difficulty of Paying Living Expenses: Not hard at all  Food Insecurity: No Food Insecurity (12/03/2020)   Hunger Vital Sign    Worried About Running Out of Food in the Last Year: Never true    Ran Out of Food in the Last Year: Never true  Transportation Needs: No Transportation Needs (12/03/2020)   PRAPARE - Administrator, Civil Service (Medical): No    Lack of Transportation (Non-Medical): No  Physical Activity: Not on file  Stress: No Stress Concern Present (12/03/2020)   [employer] of Occupational Health - Occupational Stress Questionnaire    Feeling of Stress : Only a little  Social Connections: Moderately Isolated (12/03/2020)   Social Connection and Isolation Panel [NHANES]    Frequency of Communication with Friends and Family: More than three times a week    Frequency of Social Gatherings with Friends and Family: More than three times a week    Attends Religious Services: Never    Database administrator or Organizations: No    Attends Banker Meetings: Never    Marital Status: Married  Catering manager Violence: Not At Risk (12/03/2020)   Humiliation, Afraid, Rape, and Kick questionnaire    Fear of Current or Ex-Partner: No    Emotionally Abused: No    Physically Abused: No    Sexually Abused: No    Family History: Family History  Problem Relation Age of Onset   Cancer Sister    Colon cancer Neg Hx     Current Medications:  Current Outpatient Medications:    albuterol (VENTOLIN HFA) 108 (90  Base) MCG/ACT inhaler, Inhale 2 puffs into the  lungs every 6 (six) hours as needed for wheezing or shortness of breath., Disp: , Rfl:    chlorhexidine (PERIDEX) 0.12 % solution, Use as directed 5 mLs in the mouth or throat 2 (two) times daily., Disp: , Rfl:    ENSURE (ENSURE), Take 1 Can by mouth 4 (four) times daily., Disp: , Rfl:    Ferrous Sulfate (IRON) 325 (65 Fe) MG TABS, Take 325 mg by mouth daily. , Disp: , Rfl:    fluticasone (FLONASE) 50 MCG/ACT nasal spray, fluticasone propionate 50 mcg/actuation nasal spray,suspension, Disp: , Rfl:    Fluticasone-Salmeterol (ADVAIR DISKUS) 500-50 MCG/DOSE AEPB, Inhale 1 puff into the lungs 2 (two) times daily., Disp: 60 each, Rfl: 6   levocetirizine (XYZAL) 5 MG tablet, Take 5 mg by mouth every evening. , Disp: , Rfl:    levothyroxine (SYNTHROID) 25 MCG tablet, TAKE 1/2 (ONE-HALF) TABLET BY MOUTH ONCE DAILY BEFORE BREAKFAST, Disp: 45 tablet, Rfl: 3   lidocaine-prilocaine (EMLA) cream, Apply 1 Application topically as needed (port access)., Disp: 30 g, Rfl: 3   naloxone (NARCAN) nasal spray 4 mg/0.1 mL, , Disp: , Rfl:    nicotine (NICODERM CQ - DOSED IN MG/24 HR) 7 mg/24hr patch, Place 2 mg onto the skin daily., Disp: , Rfl:    omeprazole (PRILOSEC) 40 MG capsule, Take 1 capsule (40 mg total) by mouth daily before breakfast., Disp: 90 capsule, Rfl: 3   Oxycodone HCl 10 MG TABS, Take 1 tablet (10 mg total) by mouth every 12 (twelve) hours as needed (pain.)., Disp: 60 tablet, Rfl: 0   promethazine (PHENERGAN) 6.25 MG/5ML syrup, every 4 (four) hours as needed., Disp: , Rfl:    rosuvastatin (CRESTOR) 20 MG tablet, Take 20 mg by mouth daily., Disp: , Rfl:    simvastatin (ZOCOR) 40 MG tablet, simvastatin 40 mg tablet  TAKE 1 TABLET BY MOUTH ONCE DAILY, Disp: , Rfl:  No current facility-administered medications for this visit.  Facility-Administered Medications Ordered in Other Visits:    sodium chloride flush (NS) 0.9 % injection 10 mL, 10 mL, Intracatheter,  PRN, Doreatha Massed, MD, 10 mL at 01/03/19 0930   Allergies: Allergies  Allergen Reactions   Xgeva [Denosumab]     osteonecrosis    REVIEW OF SYSTEMS:   Review of Systems  Constitutional:  Negative for chills, fatigue and fever.  HENT:   Negative for lump/mass, mouth sores, nosebleeds, sore throat and trouble swallowing.   Eyes:  Negative for eye problems.  Respiratory:  Positive for cough. Negative for shortness of breath.   Cardiovascular:  Positive for chest pain. Negative for leg swelling and palpitations.  Gastrointestinal:  Negative for abdominal pain, constipation, diarrhea, nausea and vomiting.  Genitourinary:  Negative for bladder incontinence, difficulty urinating, dysuria, frequency, hematuria and nocturia.   Musculoskeletal:  Negative for arthralgias, back pain, flank pain, myalgias and neck pain.  Skin:  Negative for itching and rash.  Neurological:  Positive for headaches. Negative for dizziness and numbness.  Hematological:  Does not bruise/bleed easily.  Psychiatric/Behavioral:  Positive for sleep disturbance. Negative for depression and suicidal ideas. The patient is not nervous/anxious.   All other systems reviewed and are negative.    VITALS:   Blood pressure (!) 140/79, pulse 90, temperature 98.1 F (36.7 C), temperature source Oral, resp. rate 18, weight 86 lb (39 kg), SpO2 99%.  Wt Readings from Last 3 Encounters:  11/13/23 86 lb (39 kg)  10/26/23 87 lb (39.5 kg)  07/06/23 91 lb 8 oz (41.5 kg)  Body mass index is 17.37 kg/m.  Performance status (ECOG): 1 - Symptomatic but completely ambulatory  PHYSICAL EXAM:   Physical Exam Vitals and nursing note reviewed. Exam conducted with a chaperone present.  Constitutional:      Appearance: Normal appearance.  Cardiovascular:     Rate and Rhythm: Normal rate and regular rhythm.     Pulses: Normal pulses.     Heart sounds: Normal heart sounds.  Pulmonary:     Effort: Pulmonary effort is normal.      Breath sounds: Normal breath sounds.  Abdominal:     Palpations: Abdomen is soft. There is no hepatomegaly, splenomegaly or mass.     Tenderness: There is no abdominal tenderness.  Musculoskeletal:     Right lower leg: No edema.     Left lower leg: No edema.  Lymphadenopathy:     Cervical: No cervical adenopathy.     Right cervical: No superficial, deep or posterior cervical adenopathy.    Left cervical: No superficial, deep or posterior cervical adenopathy.     Upper Body:     Right upper body: No supraclavicular or axillary adenopathy.     Left upper body: No supraclavicular or axillary adenopathy.  Neurological:     General: No focal deficit present.     Mental Status: She is alert and oriented to person, place, and time.  Psychiatric:        Mood and Affect: Mood normal.        Behavior: Behavior normal.     LABS:      Latest Ref Rng & Units 11/06/2023    9:52 AM 06/30/2023    9:21 AM 03/23/2023    8:42 AM  CBC  WBC 4.0 - 10.5 K/uL 11.3  7.1  9.8   Hemoglobin 12.0 - 15.0 g/dL 40.9  81.1  91.4   Hematocrit 36.0 - 46.0 % 38.7  40.2  43.1   Platelets 150 - 400 K/uL 319  227  254       Latest Ref Rng & Units 11/06/2023    9:52 AM 06/30/2023    9:21 AM 03/23/2023    8:42 AM  CMP  Glucose 70 - 99 mg/dL 782  95  956   BUN 8 - 23 mg/dL 11  9  13    Creatinine 0.44 - 1.00 mg/dL 2.13  0.86  5.78   Sodium 135 - 145 mmol/L 135  134  137   Potassium 3.5 - 5.1 mmol/L 3.4  3.6  4.0   Chloride 98 - 111 mmol/L 103  103  102   CO2 22 - 32 mmol/L 23  25  24    Calcium 8.9 - 10.3 mg/dL 9.1  8.8  8.9   Total Protein 6.5 - 8.1 g/dL 7.5  7.4  7.7   Total Bilirubin <1.2 mg/dL 0.5  0.5  1.1   Alkaline Phos 38 - 126 U/L 79  71  72   AST 15 - 41 U/L 32  27  30   ALT 0 - 44 U/L 21  20  21       No results found for: "CEA1", "CEA" / No results found for: "CEA1", "CEA" No results found for: "PSA1" No results found for: "ION629" No results found for: "CAN125"  No results found for:  "TOTALPROTELP", "ALBUMINELP", "A1GS", "A2GS", "BETS", "BETA2SER", "GAMS", "MSPIKE", "SPEI" Lab Results  Component Value Date   TIBC 286 12/02/2021   TIBC 274 02/17/2016   TIBC 228 (L) 01/15/2015   FERRITIN  28 12/02/2021   FERRITIN 115 02/17/2016   FERRITIN 800 (H) 01/15/2015   IRONPCTSAT 17 12/02/2021   IRONPCTSAT 26 02/17/2016   IRONPCTSAT 68 (H) 01/15/2015   Lab Results  Component Value Date   LDH 119 06/16/2022   LDH 158 01/27/2022   LDH 124 01/27/2022     STUDIES:   CT CHEST ABDOMEN PELVIS W CONTRAST Result Date: 11/07/2023 CLINICAL DATA:  Right upper lobe lung cancer. Metastatic disease evaluation. * Tracking Code: BO * EXAM: CT CHEST, ABDOMEN, AND PELVIS WITH CONTRAST TECHNIQUE: Multidetector CT imaging of the chest, abdomen and pelvis was performed following the standard protocol during bolus administration of intravenous contrast. RADIATION DOSE REDUCTION: This exam was performed according to the departmental dose-optimization program which includes automated exposure control, adjustment of the mA and/or kV according to patient size and/or use of iterative reconstruction technique. CONTRAST:  OMNIPAQUE IOHEXOL 300 MG/ML  SOLN COMPARISON:  Chest CT scan 06/30/2023. Chest abdomen pelvis CT 03/23/2023 and older. FINDINGS: CT CHEST FINDINGS Cardiovascular: Left upper chest port in place the the IJ. Tip along the central SVC. Heart is nonenlarged. No significant pericardial effusion. Thoracic aorta has a normal course and caliber with partially calcified scattered atherosclerotic plaque. Mediastinum/Nodes: Atrophic thyroid gland. Patulous thoracic esophagus. No discrete abnormal lymph node enlargement identified in the axillary regions, hilum or mediastinum. Some small hilar nodes identified on the right, nonpathologic by size criteria and unchanged from previous. Lungs/Pleura: Left lung has a diffuse centrilobular emphysematous changes. There also some bronchiectasis with mucous  plugging identified along the left upper lobe medially. Few paraseptal changes as well at the apex. Additional reticulonodular changes in the medial left lung apex. No left-sided dominant new mass lesion, consolidation, pleural effusion or pneumothorax.The previous small nodule in the superior segment left lower lobe measuring 4 mm has a further resolved today and may have been infectious or inflammatory. Right lung also has emphysematous changes most severe in the upper lung zones. At the right lung apex once again there is some distortion with pleural thickening and dystrophic calcifications. The extent and distribution is similar to the previous examination. There is also areas of bronchiectasis identified including inferior right upper lobe, the lower lobe and middle lobe. Areas of mucous plugging or luminal debris identified particularly in the right lower lobe such as series 3, image 91 which is increased. Musculoskeletal: Old anterior right-sided rib fractures are identified. Stable slight compression at T2 with some sclerosis. Scattered degenerative changes. CT ABDOMEN PELVIS FINDINGS Hepatobiliary: No focal liver abnormality is seen. No gallbladder wall thickening, or biliary dilatation. Patent portal vein. Possible gallstone. Pancreas: Unremarkable. No pancreatic ductal dilatation or surrounding inflammatory changes. Spleen: Normal in size without focal abnormality. Adrenals/Urinary Tract: Left adrenal gland is preserved. Nodular appearance of the right adrenal gland with a dystrophic calcification is stable. On the study of May 2024 this measured 2.2 x 1.1 cm and today series 2, image 55 2.2 by 1.0 cm. Mild atrophy of the right kidney. No enhancing renal mass or collecting system dilatation. Ureters have normal course and caliber extending down to the urinary bladder. Contracted urinary bladder. Stomach/Bowel: On this non oral contrast exam large bowel has a normal course and caliber with a few colonic  diverticula. Areas of presumed peristalsis along the right side of the colon and transverse colon. Stomach is underdistended. Question slight fold thickening. Please correlate with any symptoms. Small bowel is nondilated. Vascular/Lymphatic: Scattered vascular calcifications along the aorta and branch vessels with the areas of  stenosis suggested along the right common iliac. Please correlate with any symptoms. Normal caliber IVC. No specific abnormal lymph node enlargement identified in the abdomen and pelvis. Reproductive: Uterus and bilateral adnexa are unremarkable. Other: No free air or free fluid. Mild prominence of the mesenteric vasculature, nonspecific. Small fat containing umbilical hernia. Musculoskeletal: Scattered degenerative changes. IMPRESSION: No significant interval change. Stable pleuroparenchymal opacity in the right lung apex with scarring and fibrosis. No developing new mass lesion, fluid collection or lymph node enlargement. Stable calcified right adrenal nodule. No bowel obstruction. Few colonic diverticula. Prominent mesenteric vasculature. Areas of subtle wall thickening could be related to level of distention along the bowel. Further decrease in tiny nodule superior segment left lower lobe. This may have been infectious or inflammatory. Electronically Signed   By: Karen Kays M.D.   On: 11/07/2023 14:35

## 2023-11-13 NOTE — Patient Instructions (Signed)
[facility] at [facility] Discharge Instructions   You were seen and examined today by Dr. Ellin Saba.  He reviewed the results of your lab work which are normal/stable.   He reviewed the results of your CT scan which is stable.   We will see you back in 6 months. We will repeat lab work and a CT scan prior to this visit.   Return as scheduled.    Thank you for choosing [facility] at [facility] to provide your oncology and hematology care.  To afford each patient quality time with our provider, please arrive at least 15 minutes before your scheduled appointment time.   If you have a lab appointment with the Cancer Center please come in thru the Main Entrance and check in at the main information desk.  You need to re-schedule your appointment should you arrive 10 or more minutes late.  We strive to give you quality time with our providers, and arriving late affects you and other patients whose appointments are after yours.  Also, if you no show three or more times for appointments you may be dismissed from the clinic at the providers discretion.     Again, thank you for choosing [facility].  Our hope is that these requests will decrease the amount of time that you wait before being seen by our physicians.       _____________________________________________________________  Should you have questions after your visit to [facility], please contact our office at [phone] and follow the prompts.  Our office hours are 8:00 a.m. and 4:30 p.m. Monday - Friday.  Please note that voicemails left after 4:00 p.m. may not be returned until the following business day.  We are closed weekends and major holidays.  You do have access to a nurse 24-7, just call the main number to the clinic [phone] and do not press any options, hold on the line and a nurse will answer the phone.    For prescription refill requests, have your  pharmacy contact our office and allow 72 hours.    Due to Covid, you will need to wear a mask upon entering the hospital. If you do not have a mask, a mask will be given to you at the Main Entrance upon arrival. For doctor visits, patients may have 1 support person age 22 or older with them. For treatment visits, patients can not have anyone with them due to social distancing guidelines and our immunocompromised population.

## 2023-11-14 ENCOUNTER — Other Ambulatory Visit: Payer: Self-pay | Admitting: *Deleted

## 2023-11-14 MED ORDER — DRONABINOL 2.5 MG PO CAPS: 2.5000 mg | ORAL_CAPSULE | Freq: Two times a day (BID) | ORAL | 0 refills | Status: DC

## 2023-11-15 ENCOUNTER — Other Ambulatory Visit: Payer: Self-pay

## 2023-11-24 ENCOUNTER — Telehealth: Payer: Self-pay

## 2023-11-24 NOTE — Telephone Encounter (Signed)
 Notified patient of prior authorization approval for Dronabinol 2.5 mg Capsules. Medication is approved through 05/22/2024. Pharmacy notified. No other needs or concerns voiced at this time.

## 2023-12-07 DIAGNOSIS — H43391 Other vitreous opacities, right eye: Secondary | ICD-10-CM | POA: Diagnosis not present

## 2023-12-12 DIAGNOSIS — B351 Tinea unguium: Secondary | ICD-10-CM | POA: Diagnosis not present

## 2023-12-12 DIAGNOSIS — G629 Polyneuropathy, unspecified: Secondary | ICD-10-CM | POA: Diagnosis not present

## 2023-12-23 ENCOUNTER — Other Ambulatory Visit: Payer: Self-pay | Admitting: Hematology

## 2023-12-25 ENCOUNTER — Encounter: Payer: Self-pay | Admitting: Hematology

## 2024-01-08 ENCOUNTER — Other Ambulatory Visit: Payer: Self-pay | Admitting: *Deleted

## 2024-01-08 DIAGNOSIS — C3411 Malignant neoplasm of upper lobe, right bronchus or lung: Secondary | ICD-10-CM

## 2024-01-09 ENCOUNTER — Encounter: Payer: Self-pay | Admitting: Hematology

## 2024-01-09 MED ORDER — OXYCODONE HCL 10 MG PO TABS: 10.0000 mg | ORAL_TABLET | Freq: Two times a day (BID) | ORAL | 0 refills | Status: DC | PRN

## 2024-01-09 MED ORDER — DRONABINOL 2.5 MG PO CAPS: 2.5000 mg | ORAL_CAPSULE | Freq: Two times a day (BID) | ORAL | 1 refills | Status: DC

## 2024-01-18 DIAGNOSIS — H43391 Other vitreous opacities, right eye: Secondary | ICD-10-CM | POA: Diagnosis not present

## 2024-01-19 ENCOUNTER — Other Ambulatory Visit: Payer: Self-pay

## 2024-02-13 ENCOUNTER — Other Ambulatory Visit: Payer: Self-pay | Admitting: *Deleted

## 2024-02-13 DIAGNOSIS — C3411 Malignant neoplasm of upper lobe, right bronchus or lung: Secondary | ICD-10-CM

## 2024-02-13 MED ORDER — OXYCODONE HCL 10 MG PO TABS: 10.0000 mg | ORAL_TABLET | Freq: Two times a day (BID) | ORAL | 0 refills | Status: DC | PRN

## 2024-02-20 DIAGNOSIS — B351 Tinea unguium: Secondary | ICD-10-CM | POA: Diagnosis not present

## 2024-02-20 DIAGNOSIS — G629 Polyneuropathy, unspecified: Secondary | ICD-10-CM | POA: Diagnosis not present

## 2024-02-20 DIAGNOSIS — C3490 Malignant neoplasm of unspecified part of unspecified bronchus or lung: Secondary | ICD-10-CM | POA: Diagnosis not present

## 2024-02-23 DIAGNOSIS — Z111 Encounter for screening for respiratory tuberculosis: Secondary | ICD-10-CM | POA: Diagnosis not present

## 2024-03-05 ENCOUNTER — Other Ambulatory Visit: Payer: Self-pay | Admitting: Hematology

## 2024-03-06 ENCOUNTER — Encounter: Payer: Self-pay | Admitting: Hematology

## 2024-03-28 ENCOUNTER — Encounter: Payer: Self-pay | Admitting: Gastroenterology

## 2024-04-05 DIAGNOSIS — N1831 Chronic kidney disease, stage 3a: Secondary | ICD-10-CM | POA: Diagnosis not present

## 2024-04-05 DIAGNOSIS — Z833 Family history of diabetes mellitus: Secondary | ICD-10-CM | POA: Diagnosis not present

## 2024-04-05 DIAGNOSIS — I700 Atherosclerosis of aorta: Secondary | ICD-10-CM | POA: Diagnosis not present

## 2024-04-05 DIAGNOSIS — I739 Peripheral vascular disease, unspecified: Secondary | ICD-10-CM | POA: Diagnosis not present

## 2024-04-05 DIAGNOSIS — E785 Hyperlipidemia, unspecified: Secondary | ICD-10-CM | POA: Diagnosis not present

## 2024-04-05 DIAGNOSIS — Z8249 Family history of ischemic heart disease and other diseases of the circulatory system: Secondary | ICD-10-CM | POA: Diagnosis not present

## 2024-04-05 DIAGNOSIS — J479 Bronchiectasis, uncomplicated: Secondary | ICD-10-CM | POA: Diagnosis not present

## 2024-04-05 DIAGNOSIS — E2740 Unspecified adrenocortical insufficiency: Secondary | ICD-10-CM | POA: Diagnosis not present

## 2024-04-05 DIAGNOSIS — K219 Gastro-esophageal reflux disease without esophagitis: Secondary | ICD-10-CM | POA: Diagnosis not present

## 2024-04-05 DIAGNOSIS — E46 Unspecified protein-calorie malnutrition: Secondary | ICD-10-CM | POA: Diagnosis not present

## 2024-04-05 DIAGNOSIS — J439 Emphysema, unspecified: Secondary | ICD-10-CM | POA: Diagnosis not present

## 2024-04-05 DIAGNOSIS — I77819 Aortic ectasia, unspecified site: Secondary | ICD-10-CM | POA: Diagnosis not present

## 2024-04-22 DIAGNOSIS — R946 Abnormal results of thyroid function studies: Secondary | ICD-10-CM | POA: Diagnosis not present

## 2024-04-22 DIAGNOSIS — E782 Mixed hyperlipidemia: Secondary | ICD-10-CM | POA: Diagnosis not present

## 2024-04-22 DIAGNOSIS — R7301 Impaired fasting glucose: Secondary | ICD-10-CM | POA: Diagnosis not present

## 2024-04-26 DIAGNOSIS — E782 Mixed hyperlipidemia: Secondary | ICD-10-CM | POA: Diagnosis not present

## 2024-04-26 DIAGNOSIS — G8929 Other chronic pain: Secondary | ICD-10-CM | POA: Diagnosis not present

## 2024-04-26 DIAGNOSIS — C3490 Malignant neoplasm of unspecified part of unspecified bronchus or lung: Secondary | ICD-10-CM | POA: Diagnosis not present

## 2024-04-26 DIAGNOSIS — J4520 Mild intermittent asthma, uncomplicated: Secondary | ICD-10-CM | POA: Diagnosis not present

## 2024-04-26 DIAGNOSIS — N1831 Chronic kidney disease, stage 3a: Secondary | ICD-10-CM | POA: Diagnosis not present

## 2024-04-26 DIAGNOSIS — K219 Gastro-esophageal reflux disease without esophagitis: Secondary | ICD-10-CM | POA: Diagnosis not present

## 2024-04-26 DIAGNOSIS — D509 Iron deficiency anemia, unspecified: Secondary | ICD-10-CM | POA: Diagnosis not present

## 2024-04-26 DIAGNOSIS — R946 Abnormal results of thyroid function studies: Secondary | ICD-10-CM | POA: Diagnosis not present

## 2024-04-26 DIAGNOSIS — R7301 Impaired fasting glucose: Secondary | ICD-10-CM | POA: Diagnosis not present

## 2024-04-26 DIAGNOSIS — E875 Hyperkalemia: Secondary | ICD-10-CM | POA: Diagnosis not present

## 2024-04-26 DIAGNOSIS — E440 Moderate protein-calorie malnutrition: Secondary | ICD-10-CM | POA: Diagnosis not present

## 2024-04-30 ENCOUNTER — Other Ambulatory Visit: Payer: Self-pay

## 2024-05-03 ENCOUNTER — Other Ambulatory Visit: Payer: Self-pay | Admitting: Hematology

## 2024-05-06 ENCOUNTER — Encounter: Payer: Self-pay | Admitting: Hematology

## 2024-05-07 DIAGNOSIS — G629 Polyneuropathy, unspecified: Secondary | ICD-10-CM | POA: Diagnosis not present

## 2024-05-07 DIAGNOSIS — B351 Tinea unguium: Secondary | ICD-10-CM | POA: Diagnosis not present

## 2024-05-08 DIAGNOSIS — D509 Iron deficiency anemia, unspecified: Secondary | ICD-10-CM | POA: Diagnosis not present

## 2024-05-08 DIAGNOSIS — C3490 Malignant neoplasm of unspecified part of unspecified bronchus or lung: Secondary | ICD-10-CM | POA: Diagnosis not present

## 2024-05-08 DIAGNOSIS — N1831 Chronic kidney disease, stage 3a: Secondary | ICD-10-CM | POA: Diagnosis not present

## 2024-05-08 DIAGNOSIS — E440 Moderate protein-calorie malnutrition: Secondary | ICD-10-CM | POA: Diagnosis not present

## 2024-05-08 DIAGNOSIS — M87180 Osteonecrosis due to drugs, jaw: Secondary | ICD-10-CM | POA: Diagnosis not present

## 2024-05-08 DIAGNOSIS — K219 Gastro-esophageal reflux disease without esophagitis: Secondary | ICD-10-CM | POA: Diagnosis not present

## 2024-05-08 DIAGNOSIS — E782 Mixed hyperlipidemia: Secondary | ICD-10-CM | POA: Diagnosis not present

## 2024-05-08 DIAGNOSIS — M545 Low back pain, unspecified: Secondary | ICD-10-CM | POA: Diagnosis not present

## 2024-05-08 DIAGNOSIS — J309 Allergic rhinitis, unspecified: Secondary | ICD-10-CM | POA: Diagnosis not present

## 2024-05-08 DIAGNOSIS — J4520 Mild intermittent asthma, uncomplicated: Secondary | ICD-10-CM | POA: Diagnosis not present

## 2024-05-08 DIAGNOSIS — E039 Hypothyroidism, unspecified: Secondary | ICD-10-CM | POA: Diagnosis not present

## 2024-05-08 DIAGNOSIS — G8929 Other chronic pain: Secondary | ICD-10-CM | POA: Diagnosis not present

## 2024-05-13 ENCOUNTER — Inpatient Hospital Stay: Payer: Medicare HMO | Attending: Hematology

## 2024-05-13 ENCOUNTER — Ambulatory Visit (HOSPITAL_COMMUNITY)
Admission: RE | Admit: 2024-05-13 | Discharge: 2024-05-13 | Disposition: A | Discharge status: Home / Self Care | Payer: Medicare HMO | Source: Ambulatory Visit | Attending: Hematology | Admitting: Hematology

## 2024-05-13 DIAGNOSIS — G8929 Other chronic pain: Secondary | ICD-10-CM | POA: Diagnosis not present

## 2024-05-13 DIAGNOSIS — Z9221 Personal history of antineoplastic chemotherapy: Secondary | ICD-10-CM | POA: Insufficient documentation

## 2024-05-13 DIAGNOSIS — Z79891 Long term (current) use of opiate analgesic: Secondary | ICD-10-CM | POA: Diagnosis not present

## 2024-05-13 DIAGNOSIS — C3411 Malignant neoplasm of upper lobe, right bronchus or lung: Secondary | ICD-10-CM | POA: Insufficient documentation

## 2024-05-13 DIAGNOSIS — E039 Hypothyroidism, unspecified: Secondary | ICD-10-CM | POA: Diagnosis not present

## 2024-05-13 DIAGNOSIS — C7951 Secondary malignant neoplasm of bone: Secondary | ICD-10-CM | POA: Insufficient documentation

## 2024-05-13 DIAGNOSIS — R634 Abnormal weight loss: Secondary | ICD-10-CM | POA: Insufficient documentation

## 2024-05-13 DIAGNOSIS — F17210 Nicotine dependence, cigarettes, uncomplicated: Secondary | ICD-10-CM | POA: Diagnosis not present

## 2024-05-13 DIAGNOSIS — E278 Other specified disorders of adrenal gland: Secondary | ICD-10-CM | POA: Insufficient documentation

## 2024-05-13 DIAGNOSIS — Z923 Personal history of irradiation: Secondary | ICD-10-CM | POA: Diagnosis not present

## 2024-05-13 DIAGNOSIS — Z79890 Hormone replacement therapy (postmenopausal): Secondary | ICD-10-CM | POA: Diagnosis not present

## 2024-05-13 DIAGNOSIS — C7971 Secondary malignant neoplasm of right adrenal gland: Secondary | ICD-10-CM | POA: Insufficient documentation

## 2024-05-13 DIAGNOSIS — J439 Emphysema, unspecified: Secondary | ICD-10-CM | POA: Diagnosis not present

## 2024-05-13 DIAGNOSIS — I700 Atherosclerosis of aorta: Secondary | ICD-10-CM | POA: Diagnosis not present

## 2024-05-13 DIAGNOSIS — N189 Chronic kidney disease, unspecified: Secondary | ICD-10-CM | POA: Insufficient documentation

## 2024-05-13 DIAGNOSIS — R0789 Other chest pain: Secondary | ICD-10-CM | POA: Diagnosis not present

## 2024-05-13 DIAGNOSIS — R0781 Pleurodynia: Secondary | ICD-10-CM | POA: Insufficient documentation

## 2024-05-13 LAB — COMPREHENSIVE METABOLIC PANEL WITH GFR
ALT: 29 U/L (ref 0–44)
AST: 41 U/L (ref 15–41)
Albumin: 4.2 g/dL (ref 3.5–5.0)
Alkaline Phosphatase: 80 U/L (ref 38–126)
Anion gap: 9 (ref 5–15)
BUN: 17 mg/dL (ref 8–23)
CO2: 22 mmol/L (ref 22–32)
Calcium: 8.9 mg/dL (ref 8.9–10.3)
Chloride: 103 mmol/L (ref 98–111)
Creatinine, Ser: 1.4 mg/dL — ABNORMAL HIGH (ref 0.44–1.00)
GFR, Estimated: 41 mL/min — ABNORMAL LOW (ref >60)
Glucose, Bld: 168 mg/dL — ABNORMAL HIGH (ref 70–99)
Potassium: 4.5 mmol/L (ref 3.5–5.1)
Sodium: 134 mmol/L — ABNORMAL LOW (ref 135–145)
Total Bilirubin: 0.7 mg/dL (ref 0.0–1.2)
Total Protein: 8.2 g/dL — ABNORMAL HIGH (ref 6.5–8.1)

## 2024-05-13 LAB — CBC WITH DIFFERENTIAL/PLATELET
Abs Immature Granulocytes: 0.03 10*3/uL (ref 0.00–0.07)
Basophils Absolute: 0.1 10*3/uL (ref 0.0–0.1)
Basophils Relative: 0 %
Eosinophils Absolute: 0.1 10*3/uL (ref 0.0–0.5)
Eosinophils Relative: 0 %
HCT: 46 % (ref 36.0–46.0)
Hemoglobin: 15.2 g/dL — ABNORMAL HIGH (ref 12.0–15.0)
Immature Granulocytes: 0 %
Lymphocytes Relative: 16 %
Lymphs Abs: 1.9 10*3/uL (ref 0.7–4.0)
MCH: 31.8 pg (ref 26.0–34.0)
MCHC: 33 g/dL (ref 30.0–36.0)
MCV: 96.2 fL (ref 80.0–100.0)
Monocytes Absolute: 0.6 10*3/uL (ref 0.1–1.0)
Monocytes Relative: 5 %
Neutro Abs: 9.3 10*3/uL — ABNORMAL HIGH (ref 1.7–7.7)
Neutrophils Relative %: 79 %
Platelets: 326 10*3/uL (ref 150–400)
RBC: 4.78 MIL/uL (ref 3.87–5.11)
RDW: 14.8 % (ref 11.5–15.5)
WBC: 12 10*3/uL — ABNORMAL HIGH (ref 4.0–10.5)
nRBC: 0 % (ref 0.0–0.2)

## 2024-05-13 LAB — TSH: TSH: 1.122 u[IU]/mL (ref 0.350–4.500)

## 2024-05-13 MED ORDER — IOHEXOL 9 MG/ML PO SOLN
500.0000 mL | ORAL | Status: AC
  Administered 2024-05-13: 500 mL via ORAL

## 2024-05-13 MED ORDER — IOHEXOL 300 MG/ML  SOLN
100.0000 mL | Freq: Once | INTRAMUSCULAR | Status: AC | PRN
  Administered 2024-05-13: 100 mL via INTRAVENOUS

## 2024-05-13 MED ORDER — SODIUM CHLORIDE 0.9% FLUSH
10.0000 mL | Freq: Once | INTRAVENOUS | Status: AC
  Administered 2024-05-13: 10 mL via INTRAVENOUS

## 2024-05-13 MED ORDER — HEPARIN SOD (PORK) LOCK FLUSH 100 UNIT/ML IV SOLN
INTRAVENOUS | Status: AC
  Filled 2024-05-13: qty 5

## 2024-05-13 NOTE — Progress Notes (Signed)
  Patient here for port flush/lab. Patient's port flushed without difficulty No blood return noted. No swelling, no pain, no redness, and no discomfort while flushing.  Per pt  the port has not given blood in a long time and that doctor is aware. Labs drawn peripherally. Port left accessed due to patient have CT scan with contrast. Radiology will deaccessed port. VSS with discharge and left in satisfactory condition with no s/s of distress noted. All follow ups as scheduled.           Melissa Sullivan

## 2024-05-20 ENCOUNTER — Other Ambulatory Visit: Payer: Self-pay | Admitting: *Deleted

## 2024-05-20 ENCOUNTER — Inpatient Hospital Stay (HOSPITAL_BASED_OUTPATIENT_CLINIC_OR_DEPARTMENT_OTHER): Payer: Medicare HMO | Admitting: Hematology

## 2024-05-20 VITALS — BP 144/91 | HR 105 | Temp 98.4°F | Resp 16 | Wt 86.2 lb

## 2024-05-20 DIAGNOSIS — F17210 Nicotine dependence, cigarettes, uncomplicated: Secondary | ICD-10-CM | POA: Diagnosis not present

## 2024-05-20 DIAGNOSIS — K219 Gastro-esophageal reflux disease without esophagitis: Secondary | ICD-10-CM | POA: Diagnosis not present

## 2024-05-20 DIAGNOSIS — R0789 Other chest pain: Secondary | ICD-10-CM | POA: Diagnosis not present

## 2024-05-20 DIAGNOSIS — Z923 Personal history of irradiation: Secondary | ICD-10-CM | POA: Diagnosis not present

## 2024-05-20 DIAGNOSIS — C7951 Secondary malignant neoplasm of bone: Secondary | ICD-10-CM | POA: Diagnosis not present

## 2024-05-20 DIAGNOSIS — C7971 Secondary malignant neoplasm of right adrenal gland: Secondary | ICD-10-CM | POA: Diagnosis not present

## 2024-05-20 DIAGNOSIS — C3411 Malignant neoplasm of upper lobe, right bronchus or lung: Secondary | ICD-10-CM | POA: Diagnosis not present

## 2024-05-20 DIAGNOSIS — J4520 Mild intermittent asthma, uncomplicated: Secondary | ICD-10-CM | POA: Diagnosis not present

## 2024-05-20 DIAGNOSIS — E782 Mixed hyperlipidemia: Secondary | ICD-10-CM | POA: Diagnosis not present

## 2024-05-20 DIAGNOSIS — Z9221 Personal history of antineoplastic chemotherapy: Secondary | ICD-10-CM | POA: Diagnosis not present

## 2024-05-20 DIAGNOSIS — R0781 Pleurodynia: Secondary | ICD-10-CM | POA: Diagnosis not present

## 2024-05-20 DIAGNOSIS — E039 Hypothyroidism, unspecified: Secondary | ICD-10-CM | POA: Diagnosis not present

## 2024-05-20 DIAGNOSIS — G8929 Other chronic pain: Secondary | ICD-10-CM | POA: Diagnosis not present

## 2024-05-20 DIAGNOSIS — Z79899 Other long term (current) drug therapy: Secondary | ICD-10-CM | POA: Diagnosis not present

## 2024-05-20 DIAGNOSIS — R634 Abnormal weight loss: Secondary | ICD-10-CM | POA: Diagnosis not present

## 2024-05-20 DIAGNOSIS — N189 Chronic kidney disease, unspecified: Secondary | ICD-10-CM | POA: Diagnosis not present

## 2024-05-20 DIAGNOSIS — Z79891 Long term (current) use of opiate analgesic: Secondary | ICD-10-CM | POA: Diagnosis not present

## 2024-05-20 DIAGNOSIS — Z79890 Hormone replacement therapy (postmenopausal): Secondary | ICD-10-CM | POA: Diagnosis not present

## 2024-05-20 MED ORDER — DRONABINOL 2.5 MG PO CAPS
2.5000 mg | ORAL_CAPSULE | Freq: Two times a day (BID) | ORAL | 5 refills | Status: DC
Start: 2024-05-20 — End: 2024-05-22

## 2024-05-20 MED ORDER — OXYCODONE HCL 10 MG PO TABS: 10.0000 mg | ORAL_TABLET | Freq: Two times a day (BID) | ORAL | 0 refills | Status: DC | PRN

## 2024-05-20 NOTE — Patient Instructions (Signed)
 [facility] at [facility] Discharge Instructions   You were seen and examined today by Dr. Ellin Saba.  He reviewed the results of your lab work which are normal/stable.   He reviewed the results of your CT scan which does not show any evidence of cancer.   We will see you back in 6 months. We will repeat lab work and a CT scan prior to this visit.   Return as scheduled.    Thank you for choosing [facility] at [facility] to provide your oncology and hematology care.  To afford each patient quality time with our provider, please arrive at least 15 minutes before your scheduled appointment time.   If you have a lab appointment with the Cancer Center please come in thru the Main Entrance and check in at the main information desk.  You need to re-schedule your appointment should you arrive 10 or more minutes late.  We strive to give you quality time with our providers, and arriving late affects you and other patients whose appointments are after yours.  Also, if you no show three or more times for appointments you may be dismissed from the clinic at the providers discretion.     Again, thank you for choosing [facility].  Our hope is that these requests will decrease the amount of time that you wait before being seen by our physicians.       _____________________________________________________________  Should you have questions after your visit to [facility], please contact our office at [phone] and follow the prompts.  Our office hours are 8:00 a.m. and 4:30 p.m. Monday - Friday.  Please note that voicemails left after 4:00 p.m. may not be returned until the following business day.  We are closed weekends and major holidays.  You do have access to a nurse 24-7, just call the main number to the clinic [phone] and do not press any options, hold on the line and a nurse will answer the phone.    For prescription  refill requests, have your pharmacy contact our office and allow 72 hours.    Due to Covid, you will need to wear a mask upon entering the hospital. If you do not have a mask, a mask will be given to you at the Main Entrance upon arrival. For doctor visits, patients may have 1 support person age 53 or older with them. For treatment visits, patients can not have anyone with them due to social distancing guidelines and our immunocompromised population.

## 2024-05-20 NOTE — Progress Notes (Signed)
 [facility] [address] [place] [address]    Clinic Day:  05/20/24  Referring physician: Shona Norleen PEDLAR, MD  Patient Care Team: Shona Norleen PEDLAR, MD as PCP - General (Internal Medicine) Harvey Margo CROME, MD (Inactive) as Consulting Physician (Gastroenterology)   ASSESSMENT & PLAN:   Assessment: 1. Cancer of upper lobe of right lung [facility]) Metastatic adenocarcinoma of the lung to the bones and right adrenal: -Foundation 1 testing shows 14 genomic alterations, no approved therapies. -Opdivo  480 mg monthly started on February 25, 2015, stopped on 12/29/2022. -CT CAP on February 21, 2020 shows no evidence of progression or metastatic disease.  T2 sclerotic lesion is stable. -CTAP on 08/10/2020 with no evidence of recurrent metastatic disease in the abdomen or pelvis.  Irregular mildly enlarged right adrenal gland unchanged. -CT chest on 09/09/2020 showed partial clearing of the airspace opacities noted medially in the lingula and left lower lobe.  New irregular nodule posteriorly at the right apex likely inflammatory.  Stable sclerotic T2 vertebral body lesion. -Reviewed CT CAP from 02/19/2021 which showed stable to slight decrease in size of mediastinal lymph nodes.  Stable posttreatment changes in the left lung apex and slight decrease in size of the absent irregular nodule.  Stable sclerotic lesion in T2 vertebral body.  No new progressive findings.    Plan: 1.  Metastatic lung adenocarcinoma: - Last dose of nivolumab  on 12/29/2022.  She is on surveillance since then. - Denies any chest pains or hemoptysis.  No new pains reported. - CT CAP (05/13/2024): No evidence of recurrence.  Stable right adrenal nodule.  Other benign findings were discussed. - Labs from 05/13/2024: Mildly elevated creatinine from CKD.  LFTs are normal.  White count is slightly high, likely from steroid inhalers.  Otherwise CBC is normal. - Recommend continue monitoring with CT scan in 6 months.   2.  High risk  drug monitoring: - Latest TSH is normal at 1.1.  Continue Synthroid  25 mcg daily.   3.  Weight loss: - Continue Marinol  2.5 mg twice daily.  Her weight is stable.   4.  Chronic pains: - Occasional pain in the right chest wall and ribs.  Continue oxycodone  as needed.    Orders Placed This Encounter  Procedures   CT CHEST ABDOMEN PELVIS W CONTRAST    Standing Status:   Future    Expected Date:   11/19/2024    Expiration Date:   05/20/2025    If indicated for the ordered procedure, I authorize the administration of contrast media per Radiology protocol:   Yes    Does the patient have a contrast media/X-ray dye allergy?:   No    Release to patient:   Immediate    If indicated for the ordered procedure, I authorize the administration of oral contrast media per Radiology protocol:   Yes    Preferred imaging location?:   [facility]   CBC with Differential    Standing Status:   Future    Expected Date:   11/18/2024    Expiration Date:   02/16/2025   Comprehensive metabolic panel    Standing Status:   Future    Expected Date:   11/18/2024    Expiration Date:   02/16/2025   Magnesium     Standing Status:   Future    Expected Date:   11/18/2024    Expiration Date:   02/16/2025   TSH    Standing Status:   Future  Expected Date:   11/18/2024    Expiration Date:   02/16/2025      LILLETTE Verneta SAUNDERS Teague,acting as a scribe for Alean Stands, MD.,have documented all relevant documentation on the behalf of Alean Stands, MD,as directed by  Alean Stands, MD while in the presence of Alean Stands, MD.  I, Alean Stands MD, have reviewed the above documentation for accuracy and completeness, and I agree with the above.    Alean Stands, MD   6/30/20258:30 AM  CHIEF COMPLAINT:   Diagnosis: metastatic right lung cancer    Cancer Staging  No matching staging information was found for the patient.    Prior Therapy: 1. Bevacizumab , cisplatin  and  pemetrexed  x 4 cycles from 09/02/2014 to 11/04/2014. 2. Bevacizumab  and pemetrexed  x 3 cycles from 12/24/2014 to 02/04/2015. 3. Nivolumab  every 4 weeks from 02/25/2015 to 12/29/2022  Current Therapy:  surveillance    HISTORY OF PRESENT ILLNESS:   Oncology History  Cancer of upper lobe of right lung (HCC)  07/28/2014 Imaging   CT chest: Large R apical mass consistent with malignancy. This is destroying the R 2nd rib with extension into adjacent soft tissue. R hilar adenopathy with R 5cm adrenal metastatic lesion.   08/01/2014 Initial Biopsy   Lung, needle/core biopsy(ies), right upper lobe - POORLY DIFFERENTIATED ADENOCARCINOMA, SEE COMMENT.   08/08/2014 PET scan   Large hypermetabolic R apical mass with evidence of direct chest wall and mediastinal invasion, right retrocrural lymphadenopathy, extensive retroperitoneal lymphadenopathy, and metastatic lesions to the adrenal glands    09/02/2014 - 11/04/2014 Chemotherapy   Cisplatin /Pemetrexed /Avastin  every 21 days x 4 cycles   10/07/2014 - 10/27/2014 Radiation Therapy   Right lung apex for control of brachioplexopathy.   12/24/2014 - 02/25/2015 Chemotherapy   Alimta /Avastin  every 21 days.   02/20/2015 Imaging   Increase in size of right adrenal metastasis and subjacent confluent retrocaval lymphadenopathy   02/25/2015 -  Chemotherapy   Nivolumab , zometa    05/04/2015 Imaging   CT CAP- Stable to slight decrease in the posterior right apical lesion. Stable appearance of posterior right upper rib an upper thoracic bony lesions. Slight improvement in right upper lobe tree-in-bud opacity. No new or progressive findings in...   07/28/2015 Imaging   CT CAP- Reduced size of the right apical pleural parenchymal lesion and reduced size of the right adrenal metastatic lesion. Resolution of prior retrocrural adenopathy.  Right eccentric T1 and T2 sclerosis with sclerosis and tapering of the right second..   11/17/2015 Imaging   CT CAP- Stable soft tissue  thickening in the apex of the right hemi thorax. Stable right adrenal metastasis. Nodularity along the trachea and mainstem bronchi, relatively new from 07/28/2015, favoring adherent debris.   11/18/2015 Treatment Plan Change   Zometa  HELD for upcoming tooth extraction   11/24/2015 Treatment Plan Change   Zometa  on hold at this time in preparation for tooth extraction in March 2017.  Zometa  las given on 11/18/2015.   02/03/2016 Imaging   CT CAP- Heterogeneous right apical masslike consolidation and right adrenal metastasis are unchanged   04/06/2016 Treatment Plan Change   Zometa  restarted 6 weeks out from tooth extraction (04/06/2016)   05/12/2016 Imaging   CT CAP- NED in the chest, abdomen or pelvis.  Some areas of nodularity associated with the mainstem bronchi in the left upper lobe bronchus, favored to represent adherent inspissated secretions   08/17/2016 Imaging   CT CAP- 1. Stable CTs of the chest and abdomen. No evidence of progressive metastatic disease. 2. Probable  treated tumor at the right apex, right adrenal gland and T2 vertebral body, stable. 3. Fluctuating nodularity along the walls of the trachea and mainstem bronchi, likely secretions.   12/12/2016 Imaging   Further decrease in size of treated tumor within the right apex. 2. Stable treated tumor involving the right second rib and T2 vertebra. 3. Stable right adrenal gland treated tumor. 4. Emphysema 5. Aortic atherosclerosis   01/04/2017 Imaging   MRI brain- Normal brain MRI.  No intracranial metastatic disease.   03/15/2017 Imaging   CT CAP- 1. No new or progressive metastatic disease in the chest or abdomen. 2. Stable treated tumor in the apical right upper lobe. Stable treated right posterior second rib and right T2 vertebral lesions. Stable treated right adrenal metastasis. 3. Aortic atherosclerosis. 4. Moderate emphysema with mild diffuse bronchial wall thickening, suggesting COPD.   06/12/2017 Imaging   CT  CAP 1. Similar appearance of treated primary within the right apex. 2. Similar areas of sclerosis within the right second rib, T2, and less so T1 vertebral bodies. These are most consistent with treated metastasis. 3. Similar right adrenal treated metastasis. 4. No evidence of new or progressive disease. 5. Similar right and progressive left areas of bronchial wall thickening and probable mucoid impaction. Correlate with interval infectious symptoms. 6.  Emphysema (ICD10-J43.9). 7. Coronary artery atherosclerosis. Aortic Atherosclerosis (ICD10-I70.0).     10/16/2017 Imaging   CT CAP 1. Stable appearance of the prior Pancoast tumor and related bony findings in the right second rib and right T1 and T2 vertebra compatible with successfully treated tumor. No significant enlargement or new lesions are identified. Similarly the treated right adrenal metastatic lesion is stable in appearance. 2. Upper normal size right hilar lymph node may warrant surveillance. Currently 9 mm in short axis. 3. Other imaging findings of potential clinical significance: Aortic Atherosclerosis (ICD10-I70.0) and Emphysema (ICD10-J43.9). Scattered proximal sigmoid colon diverticula. Mucus plugging medially in the left upper lobe and posteriorly in the right upper lobe.     11/29/2017 - 06/16/2022 Chemotherapy   Patient is on Treatment Plan : LUNG Nivolumab  q28d     07/14/2022 -  Chemotherapy   Patient is on Treatment Plan : LUNG Nivolumab  (480) q28d        INTERVAL HISTORY:   Melissa Sullivan is a 68 y.o. female presenting to clinic today for follow up of metastatic right lung cancer. She was last seen by me on 11/13/23.  Since her last visit, she underwent CT C/A/P on 05/13/24 that found: Stable examination without evidence of new or progressive disease in the chest, abdomen or pelvis. Similar pleural thickening with calcifications, and architectural distortion in the right lung apex, no new suspicious nodularity.  Stable calcified right adrenal nodule. Colonic diverticulosis without findings of acute diverticulitis. Aortic Atherosclerosis and Emphysema.   Today, she states that she is doing well overall. Her appetite level is at 50%. Her energy level is at 100%.  PAST MEDICAL HISTORY:   Past Medical History: Past Medical History:  Diagnosis Date   Adrenal mass, right (HCC) 07/28/2014   Anemia    Bone metastases 04/05/2016   Cancer (HCC) 2015   lung  right   Diabetes mellitus without complication (HCC)    GERD (gastroesophageal reflux disease)    Hyperlipidemia    Hypertension    Hypothyroidism due to medication 01/30/2017   Lung mass 07/28/2014   Reflux     Surgical History: Past Surgical History:  Procedure Laterality Date   AMPUTATION Right 02/22/2017  Procedure: PARTIAL AMPUTATION RIGHT GREAT TOE;  Surgeon: Morene Anon, DPM;  Location: [facility];  Service: Podiatry;  Laterality: Right;   APPENDECTOMY     BIOPSY  09/22/2020   Procedure: BIOPSY;  Surgeon: Cindie Carlin POUR, DO;  Location: [facility];  Service: Endoscopy;;   COLONOSCOPY WITH PROPOFOL  N/A 09/22/2020   non-bleeding internal hemorrhoids, sigmoid and descending colon diverticulosis, localized mild inflammation in sigmoid that was felt due to prep artifact.    ESOPHAGOGASTRODUODENOSCOPY N/A 12/09/2014   DOQ:ipdujo esophageal stricture/mild-to-noderate erosive gastritis. negative H.pylori   FLEXIBLE SIGMOIDOSCOPY  2011   Dr. Harvey: hyperplastic polyp   LUNG BIOPSY Right 07/2014   CT guided   MALONEY DILATION N/A 12/09/2014   Procedure: MALONEY DILATION;  Surgeon: Margo LITTIE Harvey, MD;  Location: [facility];  Service: Endoscopy;  Laterality: N/A;   PORTACATH PLACEMENT Left 09/01/2014   SAVORY DILATION N/A 12/09/2014   Procedure: SAVORY DILATION;  Surgeon: Margo LITTIE Harvey, MD;  Location: [facility];  Service: Endoscopy;  Laterality: N/A;    Social History: Social History   Socioeconomic History   Marital status: Married     Spouse name: Not on file   Number of children: Not on file   Years of education: Not on file   Highest education level: Not on file  Occupational History   Not on file  Tobacco Use   Smoking status: Light Smoker    Current packs/day: 0.30    Average packs/day: 0.3 packs/day for 20.0 years (6.0 ttl pk-yrs)    Types: Cigarettes   Smokeless tobacco: Never  Vaping Use   Vaping status: Never Used  Substance and Sexual Activity   Alcohol use: No   Drug use: No   Sexual activity: Not on file  Other Topics Concern   Not on file  Social History Narrative   Not on file   Social Drivers of Health   Financial Resource Strain: Low Risk  (12/03/2020)   Overall Financial Resource Strain (CARDIA)    Difficulty of Paying Living Expenses: Not hard at all  Food Insecurity: No Food Insecurity (12/03/2020)   Hunger Vital Sign    Worried About Running Out of Food in the Last Year: Never true    Ran Out of Food in the Last Year: Never true  Transportation Needs: No Transportation Needs (12/03/2020)   PRAPARE - Administrator, Civil Service (Medical): No    Lack of Transportation (Non-Medical): No  Physical Activity: Not on file  Stress: No Stress Concern Present (12/03/2020)   [employer] of Occupational Health - Occupational Stress Questionnaire    Feeling of Stress : Only a little  Social Connections: Moderately Isolated (12/03/2020)   Social Connection and Isolation Panel    Frequency of Communication with Friends and Family: More than three times a week    Frequency of Social Gatherings with Friends and Family: More than three times a week    Attends Religious Services: Never    Database administrator or Organizations: No    Attends Banker Meetings: Never    Marital Status: Married  Catering manager Violence: Not At Risk (12/03/2020)   Humiliation, Afraid, Rape, and Kick questionnaire    Fear of Current or Ex-Partner: No    Emotionally Abused: No     Physically Abused: No    Sexually Abused: No    Family History: Family History  Problem Relation Age of Onset   Cancer Sister    Colon  cancer Neg Hx     Current Medications:  Current Outpatient Medications:    albuterol (VENTOLIN HFA) 108 (90 Base) MCG/ACT inhaler, Inhale 2 puffs into the lungs every 6 (six) hours as needed for wheezing or shortness of breath., Disp: , Rfl:    chlorhexidine  (PERIDEX ) 0.12 % solution, Use as directed 5 mLs in the mouth or throat 2 (two) times daily., Disp: , Rfl:    dronabinol  (MARINOL ) 2.5 MG capsule, TAKE ONE CAPSULE (2.5 MG TOTAL) BY MOUTH TWICE DAILY BEFORE LUNCH AND SUPPER, Disp: 60 capsule, Rfl: 5   ENSURE (ENSURE), Take 1 Can by mouth 4 (four) times daily., Disp: , Rfl:    Ferrous Sulfate (IRON) 325 (65 Fe) MG TABS, Take 325 mg by mouth daily. , Disp: , Rfl:    fluticasone  (FLONASE) 50 MCG/ACT nasal spray, fluticasone  propionate 50 mcg/actuation nasal spray,suspension, Disp: , Rfl:    Fluticasone -Salmeterol (ADVAIR DISKUS) 500-50 MCG/DOSE AEPB, Inhale 1 puff into the lungs 2 (two) times daily., Disp: 60 each, Rfl: 6   levocetirizine (XYZAL) 5 MG tablet, Take 5 mg by mouth every evening. , Disp: , Rfl:    levothyroxine  (SYNTHROID ) 25 MCG tablet, TAKE 1/2 (ONE-HALF) TABLET BY MOUTH ONCE DAILY BEFORE BREAKFAST, Disp: 45 tablet, Rfl: 3   lidocaine -prilocaine  (EMLA ) cream, Apply 1 Application topically as needed (port access)., Disp: 30 g, Rfl: 3   naloxone (NARCAN) nasal spray 4 mg/0.1 mL, , Disp: , Rfl:    nicotine  (NICODERM CQ  - DOSED IN MG/24 HR) 7 mg/24hr patch, Place 2 mg onto the skin daily., Disp: , Rfl:    omeprazole  (PRILOSEC) 40 MG capsule, Take 1 capsule (40 mg total) by mouth daily before breakfast., Disp: 90 capsule, Rfl: 3   Oxycodone  HCl 10 MG TABS, Take 1 tablet (10 mg total) by mouth every 12 (twelve) hours as needed (pain.)., Disp: 60 tablet, Rfl: 0   promethazine  (PHENERGAN ) 6.25 MG/5ML syrup, every 4 (four) hours as needed., Disp: ,  Rfl:    rosuvastatin (CRESTOR) 20 MG tablet, Take 20 mg by mouth daily., Disp: , Rfl:    simvastatin  (ZOCOR ) 40 MG tablet, simvastatin  40 mg tablet  TAKE 1 TABLET BY MOUTH ONCE DAILY, Disp: , Rfl:    hydrochlorothiazide (MICROZIDE) 12.5 MG capsule, TAKE ONE CAPSULE BY MOUTH ONCE DAILY; Duration: 90, Disp: , Rfl:  No current facility-administered medications for this visit.  Facility-Administered Medications Ordered in Other Visits:    sodium chloride  flush (NS) 0.9 % injection 10 mL, 10 mL, Intracatheter, PRN, Lasharon Dunivan, MD, 10 mL at 01/03/19 0930   Allergies: Allergies  Allergen Reactions   Xgeva  [Denosumab ]     osteonecrosis    REVIEW OF SYSTEMS:   Review of Systems  Constitutional:  Negative for chills, fatigue and fever.  HENT:   Negative for lump/mass, mouth sores, nosebleeds, sore throat and trouble swallowing.   Eyes:  Negative for eye problems.  Respiratory:  Positive for cough. Negative for shortness of breath.   Cardiovascular:  Negative for chest pain, leg swelling and palpitations.  Gastrointestinal:  Negative for abdominal pain, constipation, diarrhea, nausea and vomiting.  Genitourinary:  Negative for bladder incontinence, difficulty urinating, dysuria, frequency, hematuria and nocturia.   Musculoskeletal:  Negative for arthralgias, back pain, flank pain, myalgias and neck pain.  Skin:  Negative for itching and rash.  Neurological:  Negative for dizziness, headaches and numbness.  Hematological:  Does not bruise/bleed easily.  Psychiatric/Behavioral:  Negative for depression, sleep disturbance and suicidal ideas. The patient is  not nervous/anxious.   All other systems reviewed and are negative.    VITALS:   Blood pressure (!) 144/91, pulse (!) 105, temperature 98.4 F (36.9 C), temperature source Oral, resp. rate 16, weight 86 lb 3.2 oz (39.1 kg), SpO2 97%.  Wt Readings from Last 3 Encounters:  05/20/24 86 lb 3.2 oz (39.1 kg)  11/13/23 86 lb (39 kg)   10/26/23 87 lb (39.5 kg)    Body mass index is 17.41 kg/m.  Performance status (ECOG): 1 - Symptomatic but completely ambulatory  PHYSICAL EXAM:   Physical Exam Vitals and nursing note reviewed. Exam conducted with a chaperone present.  Constitutional:      Appearance: Normal appearance.   Cardiovascular:     Rate and Rhythm: Normal rate and regular rhythm.     Pulses: Normal pulses.     Heart sounds: Normal heart sounds.  Pulmonary:     Effort: Pulmonary effort is normal.     Breath sounds: Normal breath sounds.  Abdominal:     Palpations: Abdomen is soft. There is no hepatomegaly, splenomegaly or mass.     Tenderness: There is no abdominal tenderness.   Musculoskeletal:     Right lower leg: No edema.     Left lower leg: No edema.  Lymphadenopathy:     Cervical: No cervical adenopathy.     Right cervical: No superficial, deep or posterior cervical adenopathy.    Left cervical: No superficial, deep or posterior cervical adenopathy.     Upper Body:     Right upper body: No supraclavicular or axillary adenopathy.     Left upper body: No supraclavicular or axillary adenopathy.   Neurological:     General: No focal deficit present.     Mental Status: She is alert and oriented to person, place, and time.   Psychiatric:        Mood and Affect: Mood normal.        Behavior: Behavior normal.     LABS:      Latest Ref Rng & Units 05/13/2024    8:58 AM 11/06/2023    9:52 AM 06/30/2023    9:21 AM  CBC  WBC 4.0 - 10.5 K/uL 12.0  11.3  7.1   Hemoglobin 12.0 - 15.0 g/dL 84.7  86.9  86.4   Hematocrit 36.0 - 46.0 % 46.0  38.7  40.2   Platelets 150 - 400 K/uL 326  319  227       Latest Ref Rng & Units 05/13/2024    8:58 AM 11/06/2023    9:52 AM 06/30/2023    9:21 AM  CMP  Glucose 70 - 99 mg/dL 831  878  95   BUN 8 - 23 mg/dL 17  11  9    Creatinine 0.44 - 1.00 mg/dL 8.59  8.88  8.93   Sodium 135 - 145 mmol/L 134  135  134   Potassium 3.5 - 5.1 mmol/L 4.5  3.4  3.6    Chloride 98 - 111 mmol/L 103  103  103   CO2 22 - 32 mmol/L 22  23  25    Calcium 8.9 - 10.3 mg/dL 8.9  9.1  8.8   Total Protein 6.5 - 8.1 g/dL 8.2  7.5  7.4   Total Bilirubin 0.0 - 1.2 mg/dL 0.7  0.5  0.5   Alkaline Phos 38 - 126 U/L 80  79  71   AST 15 - 41 U/L 41  32  27   ALT 0 -  44 U/L 29  21  20       No results found for: CEA1, CEA / No results found for: CEA1, CEA No results found for: PSA1 No results found for: CAN199 No results found for: CAN125  No results found for: STEPHANY RINGS, A1GS, A2GS, BETS, BETA2SER, GAMS, MSPIKE, SPEI Lab Results  Component Value Date   TIBC 286 12/02/2021   TIBC 274 02/17/2016   TIBC 228 (L) 01/15/2015   FERRITIN 28 12/02/2021   FERRITIN 115 02/17/2016   FERRITIN 800 (H) 01/15/2015   IRONPCTSAT 17 12/02/2021   IRONPCTSAT 26 02/17/2016   IRONPCTSAT 68 (H) 01/15/2015   Lab Results  Component Value Date   LDH 119 06/16/2022   LDH 158 01/27/2022   LDH 124 01/27/2022     STUDIES:   CT CHEST ABDOMEN PELVIS W CONTRAST Result Date: 05/13/2024 CLINICAL DATA:  Follow-up right upper lobe lung cancer, metastatic disease evaluation. * Tracking Code: BO * EXAM: CT CHEST, ABDOMEN, AND PELVIS WITH CONTRAST TECHNIQUE: Multidetector CT imaging of the chest, abdomen and pelvis was performed following the standard protocol during bolus administration of intravenous contrast. RADIATION DOSE REDUCTION: This exam was performed according to the departmental dose-optimization program which includes automated exposure control, adjustment of the mA and/or kV according to patient size and/or use of iterative reconstruction technique. CONTRAST:  OMNIPAQUE  IOHEXOL  300 MG/ML  SOLN COMPARISON:  Multiple priors including CT November 06, 2023 FINDINGS: CT CHEST FINDINGS Cardiovascular: Accessed left chest Port-A-Cath with tip in the SVC. Aortic atherosclerosis. Normal size heart. No significant pericardial  effusion/thickening. Mediastinum/Nodes: No suspicious thyroid  nodule. No pathologically enlarged mediastinal, hilar or axillary lymph nodes. Lungs/Pleura: Similar pleural thickening with calcifications, and architectural distortion in the right lung apex. No suspicious pulmonary nodules or masses. Emphysematous change with mild diffuse bronchial wall thickening and scattered areas of mucoid impaction. Musculoskeletal: No aggressive lytic or blastic lesion of bone. CT ABDOMEN PELVIS FINDINGS Hepatobiliary: No suspicious hepatic lesion. Gallbladder is unremarkable. No biliary ductal dilation. Pancreas: No pancreatic ductal dilation or evidence of acute inflammation. Spleen: No splenomegaly. Adrenals/Urinary Tract: Stable calcified right adrenal nodule. No new suspicious adrenal nodule. Kidneys demonstrate symmetric enhancement. No hydronephrosis. Urinary bladder is unremarkable for degree of distension. Stomach/Bowel: Stomach is unremarkable for degree of distension. No pathologic dilation of small or large bowel. Colonic diverticulosis without findings of acute diverticulitis. Vascular/Lymphatic: Aortic atherosclerosis. The portal, splenic and superior mesenteric veins are patent. No pathologically enlarged abdominal or pelvic lymph nodes. Reproductive: Uterus and bilateral adnexa are unremarkable. Other: No significant abdominopelvic free fluid. Musculoskeletal: No aggressive lytic or blastic lesion of bone. IMPRESSION: 1. Stable examination without evidence of new or progressive disease in the chest, abdomen or pelvis. 2. Similar pleural thickening with calcifications, and architectural distortion in the right lung apex, no new suspicious nodularity. 3. Stable calcified right adrenal nodule. 4. Colonic diverticulosis without findings of acute diverticulitis. 5. Aortic Atherosclerosis (ICD10-I70.0) and Emphysema (ICD10-J43.9). Electronically Signed   By: Reyes Holder M.D.   On: 05/13/2024 14:21

## 2024-05-22 ENCOUNTER — Other Ambulatory Visit: Payer: Self-pay | Admitting: Hematology

## 2024-06-03 NOTE — Progress Notes (Unsigned)
 Referring Provider: Shona Norleen PEDLAR, MD Primary Care Physician:  Shona Norleen PEDLAR, MD Primary GI Physician: Dr. Cindie  Chief Complaint  Patient presents with   Follow-up    Follow up. No problems     HPI:   Melissa Sullivan is a 68 y.o. female with history of stage IV lung adenocarcinoma to the bones and right adrenal, HTN, HLD, hypothyroidism, diabetes, GERD, distal esophageal stricture s/p dilation January 2016, internal hemorrhoids, hyperplastic colon polyps, presenting today for routine 6 follow-up on GERD and weight loss.   Last seen in the office 10/26/2023.  GERD well-controlled on omeprazole  40 mg daily.  She had no other significant GI symptoms aside from early satiety which was a chronic issue for her.  Noted weight loss, but patient stated she was doing well with dronabinol , but had not been able to get this filled due to pharmacy not caring it.  She had established with a new pharmacy and was waiting for Dr. Rogers to refill dronabinol .  We discussed pursuing EGD, but patient preferred to hold off on this to see if symptoms would improve with dronabinol .  Requested patient let me know if she continue with weight loss/early satiety.   Today:  GERD:  Well controlled on omeprazole  40 mg daily. No nausea, vomiting, abdominal pain, or dysphagia.   Weight loss:  Down 2 lbs since I saw her in December 2024.  Out of marinol  since June. Dr. MARLA has sent in Rx, but still waiting.  When out of marinol  she just doesn't have an appetite. No real early satiety, just not interested in eating.  Has been on marinol  for a few years at this point.  With it being hot, she also has less of an appetite.   Bowels are moving well. No brbpr or melena.   Wt Readings from Last 7 Encounters:  06/05/24 84 lb 9.6 oz (38.4 kg)  05/20/24 86 lb 3.2 oz (39.1 kg)  11/13/23 86 lb (39 kg)  10/26/23 87 lb (39.5 kg)  07/06/23 91 lb 8 oz (41.5 kg)  04/27/23 94 lb 6.4 oz (42.8 kg)  03/30/23 91 lb 14.4 oz  (41.7 kg)     Past Medical History:  Diagnosis Date   Adrenal mass, right (HCC) 07/28/2014   Anemia    Bone metastases 04/05/2016   Cancer (HCC) 2015   lung  right   Diabetes mellitus without complication (HCC)    GERD (gastroesophageal reflux disease)    Hyperlipidemia    Hypertension    Hypothyroidism due to medication 01/30/2017   Lung mass 07/28/2014   Reflux     Past Surgical History:  Procedure Laterality Date   AMPUTATION Right 02/22/2017   Procedure: PARTIAL AMPUTATION RIGHT GREAT TOE;  Surgeon: Morene Anon, DPM;  Location: [facility];  Service: Podiatry;  Laterality: Right;   APPENDECTOMY     BIOPSY  09/22/2020   Procedure: BIOPSY;  Surgeon: Cindie Carlin MARLA, DO;  Location: [facility];  Service: Endoscopy;;   COLONOSCOPY WITH PROPOFOL  N/A 09/22/2020   non-bleeding internal hemorrhoids, sigmoid and descending colon diverticulosis, localized mild inflammation in sigmoid that was felt due to prep artifact.    ESOPHAGOGASTRODUODENOSCOPY N/A 12/09/2014   DOQ:ipdujo esophageal stricture/mild-to-noderate erosive gastritis. negative H.pylori   FLEXIBLE SIGMOIDOSCOPY  2011   Dr. Harvey: hyperplastic polyp   LUNG BIOPSY Right 07/2014   CT guided   MALONEY DILATION N/A 12/09/2014   Procedure: MALONEY DILATION;  Surgeon: Margo LITTIE Harvey, MD;  Location: [facility];  Service: Endoscopy;  Laterality: N/A;   PORTACATH PLACEMENT Left 09/01/2014   SAVORY DILATION N/A 12/09/2014   Procedure: SAVORY DILATION;  Surgeon: Margo LITTIE Haddock, MD;  Location: [facility];  Service: Endoscopy;  Laterality: N/A;    Current Outpatient Medications  Medication Sig Dispense Refill   albuterol (VENTOLIN HFA) 108 (90 Base) MCG/ACT inhaler Inhale 2 puffs into the lungs every 6 (six) hours as needed for wheezing or shortness of breath.     chlorhexidine  (PERIDEX ) 0.12 % solution Use as directed 5 mLs in the mouth or throat 2 (two) times daily.     dronabinol  (MARINOL ) 2.5 MG capsule TAKE ONE CAPSULE (2.5  MG TOTAL) BY MOUTH TWICE DAILY BEFORE LUNCH AND SUPPER 60 capsule 5   ENSURE (ENSURE) Take 1 Can by mouth 4 (four) times daily.     Ferrous Sulfate (IRON) 325 (65 Fe) MG TABS Take 325 mg by mouth daily.      fluticasone  (FLONASE) 50 MCG/ACT nasal spray fluticasone  propionate 50 mcg/actuation nasal spray,suspension     Fluticasone -Salmeterol (ADVAIR DISKUS) 500-50 MCG/DOSE AEPB Inhale 1 puff into the lungs 2 (two) times daily. 60 each 6   hydrochlorothiazide (MICROZIDE) 12.5 MG capsule TAKE ONE CAPSULE BY MOUTH ONCE DAILY; Duration: 90     levocetirizine (XYZAL) 5 MG tablet Take 5 mg by mouth every evening.      levothyroxine  (SYNTHROID ) 25 MCG tablet TAKE 1/2 (ONE-HALF) TABLET BY MOUTH ONCE DAILY BEFORE BREAKFAST 45 tablet 3   lidocaine -prilocaine  (EMLA ) cream Apply 1 Application topically as needed (port access). 30 g 3   naloxone (NARCAN) nasal spray 4 mg/0.1 mL      nicotine  (NICODERM CQ  - DOSED IN MG/24 HR) 7 mg/24hr patch Place 2 mg onto the skin daily.     omeprazole  (PRILOSEC) 40 MG capsule Take 1 capsule (40 mg total) by mouth daily before breakfast. 90 capsule 3   Oxycodone  HCl 10 MG TABS Take 1 tablet (10 mg total) by mouth every 12 (twelve) hours as needed (pain.). 60 tablet 0   promethazine  (PHENERGAN ) 6.25 MG/5ML syrup every 4 (four) hours as needed.     rosuvastatin (CRESTOR) 20 MG tablet Take 20 mg by mouth daily.     simvastatin  (ZOCOR ) 40 MG tablet simvastatin  40 mg tablet  TAKE 1 TABLET BY MOUTH ONCE DAILY     No current facility-administered medications for this visit.   Facility-Administered Medications Ordered in Other Visits  Medication Dose Route Frequency Provider Last Rate Last Admin   sodium chloride  flush (NS) 0.9 % injection 10 mL  10 mL Intracatheter PRN Rogers Hai, MD   10 mL at 01/03/19 0930    Allergies as of 06/05/2024 - Review Complete 06/05/2024  Allergen Reaction Noted   Xgeva  [denosumab ]  08/15/2018    Family History  Problem Relation Age  of Onset   Cancer Sister    Colon cancer Neg Hx     Social History   Socioeconomic History   Marital status: Married    Spouse name: Not on file   Number of children: Not on file   Years of education: Not on file   Highest education level: Not on file  Occupational History   Not on file  Tobacco Use   Smoking status: Light Smoker    Current packs/day: 0.30    Average packs/day: 0.3 packs/day for 20.0 years (6.0 ttl pk-yrs)    Types: Cigarettes   Smokeless tobacco: Never  Vaping Use   Vaping  status: Never Used  Substance and Sexual Activity   Alcohol use: No   Drug use: No   Sexual activity: Not on file  Other Topics Concern   Not on file  Social History Narrative   Not on file   Social Drivers of Health   Financial Resource Strain: Low Risk  (12/03/2020)   Overall Financial Resource Strain (CARDIA)    Difficulty of Paying Living Expenses: Not hard at all  Food Insecurity: No Food Insecurity (12/03/2020)   Hunger Vital Sign    Worried About Running Out of Food in the Last Year: Never true    Ran Out of Food in the Last Year: Never true  Transportation Needs: No Transportation Needs (12/03/2020)   PRAPARE - Administrator, Civil Service (Medical): No    Lack of Transportation (Non-Medical): No  Physical Activity: Not on file  Stress: No Stress Concern Present (12/03/2020)   [employer] of Occupational Health - Occupational Stress Questionnaire    Feeling of Stress : Only a little  Social Connections: Moderately Isolated (12/03/2020)   Social Connection and Isolation Panel    Frequency of Communication with Friends and Family: More than three times a week    Frequency of Social Gatherings with Friends and Family: More than three times a week    Attends Religious Services: Never    Database administrator or Organizations: No    Attends Engineer, structural: Never    Marital Status: Married    Review of Systems: Gen: Denies fever, chills,  cold or flulike symptoms, presyncope, syncope. CV: Denies chest pain, palpitations. Resp: Denies dyspnea, cough. GI: See HPI. Heme: See HPI  Physical Exam: BP 128/76 (BP Location: Right Arm, Patient Position: Sitting, Cuff Size: Normal)   Pulse (!) 103   Temp 97.9 F (36.6 C) (Temporal)   Ht 4' 11 (1.499 m)   Wt 84 lb 9.6 oz (38.4 kg)   BMI 17.09 kg/m  General:   Alert and oriented. No distress noted. Pleasant and cooperative.  Head:  Normocephalic and atraumatic. Eyes:  Conjuctiva clear without scleral icterus. Heart:  S1, S2 present without murmurs appreciated. Lungs:  Clear to auscultation bilaterally. No wheezes, rales, or rhonchi. No distress.  Abdomen:  +BS, soft, non-tender and non-distended. No rebound or guarding. No HSM or masses noted. Msk:  Symmetrical without gross deformities. Normal posture. Extremities:  Without edema. Neurologic:  Alert and  oriented x4 Psych:  Normal mood and affect.    Assessment:  68 y.o. female with history of stage IV lung adenocarcinoma to the bones and right adrenal, HTN, HLD, hypothyroidism, diabetes, GERD, distal esophageal stricture s/p dilation January 2016, internal hemorrhoids, hyperplastic colon polyps, presenting today for routine 6 follow-up on GERD.   GERD: Well-controlled on omeprazole  40 mg daily.  No alarm symptoms.  Weight loss: Previously noted weight loss at her last office visit in December 2024 which she attributed to being out of Marinol .  She is chronically on Marinol , prescribed by her oncologist, Dr. Rogers.  She resumed Marinol  and weight stabilized, but again out of Marinol  for the last month and has lost 2 pounds.  She denies any other significant GI symptoms.  States she just does not have an appetite/interest in eating without Marinol .  We previously discussed EGD and again discussed this today, but patient prefers to avoid this if possible. Colonoscopy is up to date.    Plan:  Continue omeprazole  40 mg  daily, 30 minutes before  breakfast. Follow-up with pharmacy/Dr. Katragadda on Marinol  Rx.  Recommended 4-6 small meals daily to help maintain adequate p.o. intake. Recommended 1-2 protein shakes daily. Patient will continue to monitor her weight and let me know if she has persistent weight loss or develops any other concerning GI symptoms. Follow-up in 1 year or sooner if needed.   Josette Centers, PA-C [facility] 06/05/2024

## 2024-06-04 DIAGNOSIS — J4520 Mild intermittent asthma, uncomplicated: Secondary | ICD-10-CM | POA: Diagnosis not present

## 2024-06-04 DIAGNOSIS — N1831 Chronic kidney disease, stage 3a: Secondary | ICD-10-CM | POA: Diagnosis not present

## 2024-06-04 DIAGNOSIS — K219 Gastro-esophageal reflux disease without esophagitis: Secondary | ICD-10-CM | POA: Diagnosis not present

## 2024-06-04 DIAGNOSIS — E782 Mixed hyperlipidemia: Secondary | ICD-10-CM | POA: Diagnosis not present

## 2024-06-04 DIAGNOSIS — M545 Low back pain, unspecified: Secondary | ICD-10-CM | POA: Diagnosis not present

## 2024-06-04 DIAGNOSIS — E039 Hypothyroidism, unspecified: Secondary | ICD-10-CM | POA: Diagnosis not present

## 2024-06-04 DIAGNOSIS — M87180 Osteonecrosis due to drugs, jaw: Secondary | ICD-10-CM | POA: Diagnosis not present

## 2024-06-04 DIAGNOSIS — E440 Moderate protein-calorie malnutrition: Secondary | ICD-10-CM | POA: Diagnosis not present

## 2024-06-04 DIAGNOSIS — G8929 Other chronic pain: Secondary | ICD-10-CM | POA: Diagnosis not present

## 2024-06-04 DIAGNOSIS — D509 Iron deficiency anemia, unspecified: Secondary | ICD-10-CM | POA: Diagnosis not present

## 2024-06-04 DIAGNOSIS — C3490 Malignant neoplasm of unspecified part of unspecified bronchus or lung: Secondary | ICD-10-CM | POA: Diagnosis not present

## 2024-06-04 DIAGNOSIS — J309 Allergic rhinitis, unspecified: Secondary | ICD-10-CM | POA: Diagnosis not present

## 2024-06-05 ENCOUNTER — Telehealth: Payer: Self-pay | Admitting: *Deleted

## 2024-06-05 ENCOUNTER — Encounter: Payer: Self-pay | Admitting: Gastroenterology

## 2024-06-05 ENCOUNTER — Ambulatory Visit: Admitting: Gastroenterology

## 2024-06-05 VITALS — BP 128/76 | HR 103 | Temp 97.9°F | Ht 59.0 in | Wt 84.6 lb

## 2024-06-05 DIAGNOSIS — K219 Gastro-esophageal reflux disease without esophagitis: Secondary | ICD-10-CM

## 2024-06-05 DIAGNOSIS — R634 Abnormal weight loss: Secondary | ICD-10-CM | POA: Diagnosis not present

## 2024-06-05 NOTE — Patient Instructions (Addendum)
 Continue taking omeprazole  40 mg daily, 30 minutes before breakfast.  Follow-up with your pharmacy and Dr. Rogers regarding your prescription of Marinol .  Continue to monitor your weight.  If you have persistent weight loss or develop any other concerning GI symptoms/trouble eating, please let me know.  Try eating 4-6 small meals daily and drinking 1-2 protein shakes daily to help with weight maintenance.  We will follow-up with you in 1 year or sooner if needed.  Josette Centers, PA-C [facility]

## 2024-06-05 NOTE — Telephone Encounter (Signed)
 06/04/2024 Attempted Dronabinol  2.5 mg prior authorization Key: [id number].  Received message that [facility] is not able to process this request through [employer], contact the plan [phone]) per CoverMyMeds.  Today connected with [facility] determination help desk representative [place].  Provided clinical information.  Awaiting call within 72 hours.SABRA

## 2024-06-10 ENCOUNTER — Other Ambulatory Visit: Payer: Self-pay | Admitting: *Deleted

## 2024-06-10 MED ORDER — DRONABINOL 2.5 MG PO CAPS: 2.5000 mg | ORAL_CAPSULE | Freq: Two times a day (BID) | ORAL | 5 refills | Status: DC

## 2024-06-11 NOTE — Telephone Encounter (Signed)
 Connected with [facility] [employer] for status check.  Dronabinol  was denied on 06/07/2024, not associated with a chemotherapy treatment plan.   Submitted appeal case number: [id number].

## 2024-06-20 DIAGNOSIS — E039 Hypothyroidism, unspecified: Secondary | ICD-10-CM | POA: Diagnosis not present

## 2024-06-20 DIAGNOSIS — N1831 Chronic kidney disease, stage 3a: Secondary | ICD-10-CM | POA: Diagnosis not present

## 2024-06-20 DIAGNOSIS — E782 Mixed hyperlipidemia: Secondary | ICD-10-CM | POA: Diagnosis not present

## 2024-06-20 DIAGNOSIS — K219 Gastro-esophageal reflux disease without esophagitis: Secondary | ICD-10-CM | POA: Diagnosis not present

## 2024-07-05 DIAGNOSIS — G8929 Other chronic pain: Secondary | ICD-10-CM | POA: Diagnosis not present

## 2024-07-05 DIAGNOSIS — M87180 Osteonecrosis due to drugs, jaw: Secondary | ICD-10-CM | POA: Diagnosis not present

## 2024-07-05 DIAGNOSIS — J309 Allergic rhinitis, unspecified: Secondary | ICD-10-CM | POA: Diagnosis not present

## 2024-07-05 DIAGNOSIS — N1831 Chronic kidney disease, stage 3a: Secondary | ICD-10-CM | POA: Diagnosis not present

## 2024-07-05 DIAGNOSIS — C3490 Malignant neoplasm of unspecified part of unspecified bronchus or lung: Secondary | ICD-10-CM | POA: Diagnosis not present

## 2024-07-05 DIAGNOSIS — E039 Hypothyroidism, unspecified: Secondary | ICD-10-CM | POA: Diagnosis not present

## 2024-07-05 DIAGNOSIS — K219 Gastro-esophageal reflux disease without esophagitis: Secondary | ICD-10-CM | POA: Diagnosis not present

## 2024-07-05 DIAGNOSIS — E782 Mixed hyperlipidemia: Secondary | ICD-10-CM | POA: Diagnosis not present

## 2024-07-05 DIAGNOSIS — J4520 Mild intermittent asthma, uncomplicated: Secondary | ICD-10-CM | POA: Diagnosis not present

## 2024-07-05 DIAGNOSIS — D509 Iron deficiency anemia, unspecified: Secondary | ICD-10-CM | POA: Diagnosis not present

## 2024-07-05 DIAGNOSIS — M545 Low back pain, unspecified: Secondary | ICD-10-CM | POA: Diagnosis not present

## 2024-07-05 DIAGNOSIS — E440 Moderate protein-calorie malnutrition: Secondary | ICD-10-CM | POA: Diagnosis not present

## 2024-07-18 DIAGNOSIS — B351 Tinea unguium: Secondary | ICD-10-CM | POA: Diagnosis not present

## 2024-07-18 DIAGNOSIS — G629 Polyneuropathy, unspecified: Secondary | ICD-10-CM | POA: Diagnosis not present

## 2024-07-19 DIAGNOSIS — I700 Atherosclerosis of aorta: Secondary | ICD-10-CM | POA: Diagnosis not present

## 2024-07-19 DIAGNOSIS — C3490 Malignant neoplasm of unspecified part of unspecified bronchus or lung: Secondary | ICD-10-CM | POA: Diagnosis not present

## 2024-07-19 DIAGNOSIS — K219 Gastro-esophageal reflux disease without esophagitis: Secondary | ICD-10-CM | POA: Diagnosis not present

## 2024-07-19 DIAGNOSIS — E782 Mixed hyperlipidemia: Secondary | ICD-10-CM | POA: Diagnosis not present

## 2024-07-30 DIAGNOSIS — J4520 Mild intermittent asthma, uncomplicated: Secondary | ICD-10-CM | POA: Diagnosis not present

## 2024-07-30 DIAGNOSIS — K219 Gastro-esophageal reflux disease without esophagitis: Secondary | ICD-10-CM | POA: Diagnosis not present

## 2024-07-30 DIAGNOSIS — C3490 Malignant neoplasm of unspecified part of unspecified bronchus or lung: Secondary | ICD-10-CM | POA: Diagnosis not present

## 2024-07-30 DIAGNOSIS — G8929 Other chronic pain: Secondary | ICD-10-CM | POA: Diagnosis not present

## 2024-07-30 DIAGNOSIS — E039 Hypothyroidism, unspecified: Secondary | ICD-10-CM | POA: Diagnosis not present

## 2024-07-30 DIAGNOSIS — D509 Iron deficiency anemia, unspecified: Secondary | ICD-10-CM | POA: Diagnosis not present

## 2024-07-30 DIAGNOSIS — J309 Allergic rhinitis, unspecified: Secondary | ICD-10-CM | POA: Diagnosis not present

## 2024-07-30 DIAGNOSIS — N1831 Chronic kidney disease, stage 3a: Secondary | ICD-10-CM | POA: Diagnosis not present

## 2024-07-30 DIAGNOSIS — M87180 Osteonecrosis due to drugs, jaw: Secondary | ICD-10-CM | POA: Diagnosis not present

## 2024-07-30 DIAGNOSIS — M545 Low back pain, unspecified: Secondary | ICD-10-CM | POA: Diagnosis not present

## 2024-07-30 DIAGNOSIS — E782 Mixed hyperlipidemia: Secondary | ICD-10-CM | POA: Diagnosis not present

## 2024-07-30 DIAGNOSIS — E440 Moderate protein-calorie malnutrition: Secondary | ICD-10-CM | POA: Diagnosis not present

## 2024-08-20 DIAGNOSIS — J4520 Mild intermittent asthma, uncomplicated: Secondary | ICD-10-CM | POA: Diagnosis not present

## 2024-08-20 DIAGNOSIS — C3490 Malignant neoplasm of unspecified part of unspecified bronchus or lung: Secondary | ICD-10-CM | POA: Diagnosis not present

## 2024-08-20 DIAGNOSIS — E039 Hypothyroidism, unspecified: Secondary | ICD-10-CM | POA: Diagnosis not present

## 2024-08-20 DIAGNOSIS — E782 Mixed hyperlipidemia: Secondary | ICD-10-CM | POA: Diagnosis not present

## 2024-08-23 ENCOUNTER — Inpatient Hospital Stay: Attending: Hematology

## 2024-08-23 ENCOUNTER — Other Ambulatory Visit: Payer: Self-pay

## 2024-08-23 DIAGNOSIS — Z452 Encounter for adjustment and management of vascular access device: Secondary | ICD-10-CM | POA: Diagnosis not present

## 2024-08-23 DIAGNOSIS — C3411 Malignant neoplasm of upper lobe, right bronchus or lung: Secondary | ICD-10-CM | POA: Diagnosis not present

## 2024-08-23 DIAGNOSIS — C7951 Secondary malignant neoplasm of bone: Secondary | ICD-10-CM | POA: Insufficient documentation

## 2024-08-23 DIAGNOSIS — C7971 Secondary malignant neoplasm of right adrenal gland: Secondary | ICD-10-CM | POA: Diagnosis not present

## 2024-08-23 MED ORDER — LIDOCAINE-PRILOCAINE 2.5-2.5 % EX CREA: 1.0000 | TOPICAL_CREAM | CUTANEOUS | 3 refills | Status: AC | PRN

## 2024-08-23 NOTE — Progress Notes (Signed)
 Melissa Sullivan presented for Portacath access and flush. Proper placement of portacath confirmed by CXR. Portacath located left chest wall accessed with  H 20 needle. No blood return, no resistance met. Portacath flushed with 20ml NS and needle removed intact. Procedure without incident. Patient tolerated procedure well.

## 2024-08-23 NOTE — Patient Instructions (Signed)
 [facility] [place] - A DEPT OF [facility]  Discharge Instructions: Thank you for choosing [facility] to provide your oncology and hematology care.  If you have a lab appointment with the Cancer Center - please note that after April 8th, 2024, all labs will be drawn in the cancer center.  You do not have to check in or register with the main entrance as you have in the past but will complete your check-in in the cancer center.  Wear comfortable clothing and clothing appropriate for easy access to any Portacath or PICC line.   We strive to give you quality time with your provider. You may need to reschedule your appointment if you arrive late (15 or more minutes).  Arriving late affects you and other patients whose appointments are after yours.  Also, if you miss three or more appointments without notifying the office, you may be dismissed from the clinic at the provider's discretion.      For prescription refill requests, have your pharmacy contact our office and allow 72 hours for refills to be completed.    Today you received the following port flush   To help prevent nausea and vomiting after your treatment, we encourage you to take your nausea medication as directed.  BELOW ARE SYMPTOMS THAT SHOULD BE REPORTED IMMEDIATELY: *FEVER GREATER THAN 100.4 F (38 C) OR HIGHER *CHILLS OR SWEATING *NAUSEA AND VOMITING THAT IS NOT CONTROLLED WITH YOUR NAUSEA MEDICATION *UNUSUAL SHORTNESS OF BREATH *UNUSUAL BRUISING OR BLEEDING *URINARY PROBLEMS (pain or burning when urinating, or frequent urination) *BOWEL PROBLEMS (unusual diarrhea, constipation, pain near the anus) TENDERNESS IN MOUTH AND THROAT WITH OR WITHOUT PRESENCE OF ULCERS (sore throat, sores in mouth, or a toothache) UNUSUAL RASH, SWELLING OR PAIN  UNUSUAL VAGINAL DISCHARGE OR ITCHING   Items with * indicate a potential emergency and should be followed up as soon as possible or go to the Emergency  Department if any problems should occur.  Please show the CHEMOTHERAPY ALERT CARD or IMMUNOTHERAPY ALERT CARD at check-in to the Emergency Department and triage nurse.  Should you have questions after your visit or need to cancel or reschedule your appointment, please contact [facility] [place] - A DEPT OF [facility] [phone]  and follow the prompts.  Office hours are 8:00 a.m. to 4:30 p.m. Monday - Friday. Please note that voicemails left after 4:00 p.m. may not be returned until the following business day.  We are closed weekends and major holidays. You have access to a nurse at all times for urgent questions. Please call the main number to the clinic [phone] and follow the prompts.  For any non-urgent questions, you may also contact your provider using MyChart. We now offer e-Visits for anyone 15 and older to request care online for non-urgent symptoms. For details visit [email or web address].   Also download the MyChart app! Go to the app store, search "MyChart", open the app, select [place], and log in with your MyChart username and password.

## 2024-08-27 DIAGNOSIS — D509 Iron deficiency anemia, unspecified: Secondary | ICD-10-CM | POA: Diagnosis not present

## 2024-08-27 DIAGNOSIS — N1831 Chronic kidney disease, stage 3a: Secondary | ICD-10-CM | POA: Diagnosis not present

## 2024-08-27 DIAGNOSIS — E440 Moderate protein-calorie malnutrition: Secondary | ICD-10-CM | POA: Diagnosis not present

## 2024-08-27 DIAGNOSIS — K219 Gastro-esophageal reflux disease without esophagitis: Secondary | ICD-10-CM | POA: Diagnosis not present

## 2024-08-27 DIAGNOSIS — E782 Mixed hyperlipidemia: Secondary | ICD-10-CM | POA: Diagnosis not present

## 2024-08-27 DIAGNOSIS — M545 Low back pain, unspecified: Secondary | ICD-10-CM | POA: Diagnosis not present

## 2024-08-27 DIAGNOSIS — G8929 Other chronic pain: Secondary | ICD-10-CM | POA: Diagnosis not present

## 2024-08-27 DIAGNOSIS — C3490 Malignant neoplasm of unspecified part of unspecified bronchus or lung: Secondary | ICD-10-CM | POA: Diagnosis not present

## 2024-08-27 DIAGNOSIS — E039 Hypothyroidism, unspecified: Secondary | ICD-10-CM | POA: Diagnosis not present

## 2024-08-27 DIAGNOSIS — J4520 Mild intermittent asthma, uncomplicated: Secondary | ICD-10-CM | POA: Diagnosis not present

## 2024-08-27 DIAGNOSIS — J309 Allergic rhinitis, unspecified: Secondary | ICD-10-CM | POA: Diagnosis not present

## 2024-08-27 DIAGNOSIS — M87180 Osteonecrosis due to drugs, jaw: Secondary | ICD-10-CM | POA: Diagnosis not present

## 2024-08-30 ENCOUNTER — Other Ambulatory Visit: Payer: Self-pay

## 2024-08-30 DIAGNOSIS — C3411 Malignant neoplasm of upper lobe, right bronchus or lung: Secondary | ICD-10-CM

## 2024-09-02 ENCOUNTER — Other Ambulatory Visit: Payer: Self-pay | Admitting: *Deleted

## 2024-09-02 DIAGNOSIS — C3411 Malignant neoplasm of upper lobe, right bronchus or lung: Secondary | ICD-10-CM

## 2024-09-02 MED ORDER — OXYCODONE HCL 10 MG PO TABS: 10.0000 mg | ORAL_TABLET | Freq: Two times a day (BID) | ORAL | 0 refills | Status: DC | PRN

## 2024-09-06 ENCOUNTER — Other Ambulatory Visit (HOSPITAL_COMMUNITY): Payer: Self-pay | Admitting: Internal Medicine

## 2024-09-06 DIAGNOSIS — Z1231 Encounter for screening mammogram for malignant neoplasm of breast: Secondary | ICD-10-CM

## 2024-09-12 ENCOUNTER — Ambulatory Visit (HOSPITAL_COMMUNITY)
Admission: RE | Admit: 2024-09-12 | Discharge: 2024-09-12 | Disposition: A | Discharge status: Home / Self Care | Source: Ambulatory Visit | Attending: Internal Medicine | Admitting: Internal Medicine

## 2024-09-12 ENCOUNTER — Encounter (HOSPITAL_COMMUNITY): Payer: Self-pay

## 2024-09-12 DIAGNOSIS — Z1231 Encounter for screening mammogram for malignant neoplasm of breast: Secondary | ICD-10-CM | POA: Diagnosis not present

## 2024-09-20 DIAGNOSIS — E440 Moderate protein-calorie malnutrition: Secondary | ICD-10-CM | POA: Diagnosis not present

## 2024-09-20 DIAGNOSIS — E782 Mixed hyperlipidemia: Secondary | ICD-10-CM | POA: Diagnosis not present

## 2024-09-20 DIAGNOSIS — C3490 Malignant neoplasm of unspecified part of unspecified bronchus or lung: Secondary | ICD-10-CM | POA: Diagnosis not present

## 2024-09-20 DIAGNOSIS — J4520 Mild intermittent asthma, uncomplicated: Secondary | ICD-10-CM | POA: Diagnosis not present

## 2024-09-25 DIAGNOSIS — G8929 Other chronic pain: Secondary | ICD-10-CM | POA: Diagnosis not present

## 2024-09-25 DIAGNOSIS — M87180 Osteonecrosis due to drugs, jaw: Secondary | ICD-10-CM | POA: Diagnosis not present

## 2024-09-25 DIAGNOSIS — J4520 Mild intermittent asthma, uncomplicated: Secondary | ICD-10-CM | POA: Diagnosis not present

## 2024-09-25 DIAGNOSIS — K219 Gastro-esophageal reflux disease without esophagitis: Secondary | ICD-10-CM | POA: Diagnosis not present

## 2024-09-25 DIAGNOSIS — M545 Low back pain, unspecified: Secondary | ICD-10-CM | POA: Diagnosis not present

## 2024-09-25 DIAGNOSIS — N1831 Chronic kidney disease, stage 3a: Secondary | ICD-10-CM | POA: Diagnosis not present

## 2024-09-25 DIAGNOSIS — E039 Hypothyroidism, unspecified: Secondary | ICD-10-CM | POA: Diagnosis not present

## 2024-09-25 DIAGNOSIS — J309 Allergic rhinitis, unspecified: Secondary | ICD-10-CM | POA: Diagnosis not present

## 2024-09-25 DIAGNOSIS — D509 Iron deficiency anemia, unspecified: Secondary | ICD-10-CM | POA: Diagnosis not present

## 2024-09-25 DIAGNOSIS — E440 Moderate protein-calorie malnutrition: Secondary | ICD-10-CM | POA: Diagnosis not present

## 2024-09-25 DIAGNOSIS — C3490 Malignant neoplasm of unspecified part of unspecified bronchus or lung: Secondary | ICD-10-CM | POA: Diagnosis not present

## 2024-09-25 DIAGNOSIS — E782 Mixed hyperlipidemia: Secondary | ICD-10-CM | POA: Diagnosis not present

## 2024-10-03 DIAGNOSIS — B351 Tinea unguium: Secondary | ICD-10-CM | POA: Diagnosis not present

## 2024-10-03 DIAGNOSIS — G629 Polyneuropathy, unspecified: Secondary | ICD-10-CM | POA: Diagnosis not present

## 2024-10-14 ENCOUNTER — Other Ambulatory Visit: Payer: Self-pay | Admitting: Gastroenterology

## 2024-10-14 DIAGNOSIS — K219 Gastro-esophageal reflux disease without esophagitis: Secondary | ICD-10-CM

## 2024-10-20 DIAGNOSIS — C3490 Malignant neoplasm of unspecified part of unspecified bronchus or lung: Secondary | ICD-10-CM | POA: Diagnosis not present

## 2024-10-20 DIAGNOSIS — I700 Atherosclerosis of aorta: Secondary | ICD-10-CM | POA: Diagnosis not present

## 2024-10-20 DIAGNOSIS — E039 Hypothyroidism, unspecified: Secondary | ICD-10-CM | POA: Diagnosis not present

## 2024-10-20 DIAGNOSIS — E782 Mixed hyperlipidemia: Secondary | ICD-10-CM | POA: Diagnosis not present

## 2024-10-23 DIAGNOSIS — K219 Gastro-esophageal reflux disease without esophagitis: Secondary | ICD-10-CM | POA: Diagnosis not present

## 2024-10-23 DIAGNOSIS — M545 Low back pain, unspecified: Secondary | ICD-10-CM | POA: Diagnosis not present

## 2024-10-23 DIAGNOSIS — E440 Moderate protein-calorie malnutrition: Secondary | ICD-10-CM | POA: Diagnosis not present

## 2024-10-23 DIAGNOSIS — D509 Iron deficiency anemia, unspecified: Secondary | ICD-10-CM | POA: Diagnosis not present

## 2024-10-23 DIAGNOSIS — N1831 Chronic kidney disease, stage 3a: Secondary | ICD-10-CM | POA: Diagnosis not present

## 2024-10-23 DIAGNOSIS — E782 Mixed hyperlipidemia: Secondary | ICD-10-CM | POA: Diagnosis not present

## 2024-10-23 DIAGNOSIS — M87180 Osteonecrosis due to drugs, jaw: Secondary | ICD-10-CM | POA: Diagnosis not present

## 2024-10-23 DIAGNOSIS — G8929 Other chronic pain: Secondary | ICD-10-CM | POA: Diagnosis not present

## 2024-10-23 DIAGNOSIS — E039 Hypothyroidism, unspecified: Secondary | ICD-10-CM | POA: Diagnosis not present

## 2024-10-23 DIAGNOSIS — J309 Allergic rhinitis, unspecified: Secondary | ICD-10-CM | POA: Diagnosis not present

## 2024-10-23 DIAGNOSIS — J4520 Mild intermittent asthma, uncomplicated: Secondary | ICD-10-CM | POA: Diagnosis not present

## 2024-10-23 DIAGNOSIS — C3490 Malignant neoplasm of unspecified part of unspecified bronchus or lung: Secondary | ICD-10-CM | POA: Diagnosis not present

## 2024-10-25 DIAGNOSIS — E782 Mixed hyperlipidemia: Secondary | ICD-10-CM | POA: Diagnosis not present

## 2024-10-25 DIAGNOSIS — R7301 Impaired fasting glucose: Secondary | ICD-10-CM | POA: Diagnosis not present

## 2024-10-25 DIAGNOSIS — R946 Abnormal results of thyroid function studies: Secondary | ICD-10-CM | POA: Diagnosis not present

## 2024-10-31 ENCOUNTER — Other Ambulatory Visit (HOSPITAL_COMMUNITY): Payer: Self-pay | Admitting: Internal Medicine

## 2024-10-31 DIAGNOSIS — R946 Abnormal results of thyroid function studies: Secondary | ICD-10-CM | POA: Diagnosis not present

## 2024-10-31 DIAGNOSIS — M25512 Pain in left shoulder: Secondary | ICD-10-CM | POA: Diagnosis not present

## 2024-10-31 DIAGNOSIS — Z0001 Encounter for general adult medical examination with abnormal findings: Secondary | ICD-10-CM | POA: Diagnosis not present

## 2024-10-31 DIAGNOSIS — E782 Mixed hyperlipidemia: Secondary | ICD-10-CM | POA: Diagnosis not present

## 2024-10-31 DIAGNOSIS — Z23 Encounter for immunization: Secondary | ICD-10-CM | POA: Diagnosis not present

## 2024-10-31 DIAGNOSIS — J4520 Mild intermittent asthma, uncomplicated: Secondary | ICD-10-CM | POA: Diagnosis not present

## 2024-10-31 DIAGNOSIS — E875 Hyperkalemia: Secondary | ICD-10-CM | POA: Diagnosis not present

## 2024-10-31 DIAGNOSIS — Z13820 Encounter for screening for osteoporosis: Secondary | ICD-10-CM

## 2024-10-31 DIAGNOSIS — Z0000 Encounter for general adult medical examination without abnormal findings: Secondary | ICD-10-CM | POA: Diagnosis not present

## 2024-10-31 DIAGNOSIS — C3490 Malignant neoplasm of unspecified part of unspecified bronchus or lung: Secondary | ICD-10-CM | POA: Diagnosis not present

## 2024-10-31 DIAGNOSIS — R7301 Impaired fasting glucose: Secondary | ICD-10-CM | POA: Diagnosis not present

## 2024-10-31 DIAGNOSIS — M25511 Pain in right shoulder: Secondary | ICD-10-CM | POA: Diagnosis not present

## 2024-11-08 ENCOUNTER — Ambulatory Visit (HOSPITAL_COMMUNITY)
Admission: RE | Admit: 2024-11-08 | Discharge: 2024-11-08 | Disposition: A | Discharge status: Home / Self Care | Source: Ambulatory Visit | Attending: Internal Medicine | Admitting: Internal Medicine

## 2024-11-08 DIAGNOSIS — M8589 Other specified disorders of bone density and structure, multiple sites: Secondary | ICD-10-CM | POA: Insufficient documentation

## 2024-11-08 DIAGNOSIS — Z13820 Encounter for screening for osteoporosis: Secondary | ICD-10-CM | POA: Insufficient documentation

## 2024-11-08 DIAGNOSIS — Z780 Asymptomatic menopausal state: Secondary | ICD-10-CM | POA: Diagnosis not present

## 2024-11-19 ENCOUNTER — Ambulatory Visit (HOSPITAL_COMMUNITY)
Admission: RE | Admit: 2024-11-19 | Discharge: 2024-11-19 | Disposition: A | Discharge status: Home / Self Care | Source: Ambulatory Visit | Attending: Oncology | Admitting: Oncology

## 2024-11-19 ENCOUNTER — Inpatient Hospital Stay: Attending: Hematology

## 2024-11-19 ENCOUNTER — Inpatient Hospital Stay

## 2024-11-19 DIAGNOSIS — C7951 Secondary malignant neoplasm of bone: Secondary | ICD-10-CM | POA: Insufficient documentation

## 2024-11-19 DIAGNOSIS — C3411 Malignant neoplasm of upper lobe, right bronchus or lung: Secondary | ICD-10-CM | POA: Insufficient documentation

## 2024-11-19 DIAGNOSIS — R634 Abnormal weight loss: Secondary | ICD-10-CM | POA: Insufficient documentation

## 2024-11-19 DIAGNOSIS — Z79899 Other long term (current) drug therapy: Secondary | ICD-10-CM | POA: Insufficient documentation

## 2024-11-19 DIAGNOSIS — C7971 Secondary malignant neoplasm of right adrenal gland: Secondary | ICD-10-CM | POA: Diagnosis not present

## 2024-11-19 LAB — CBC WITH DIFFERENTIAL/PLATELET
Abs Immature Granulocytes: 0.04 K/uL (ref 0.00–0.07)
Basophils Absolute: 0 K/uL (ref 0.0–0.1)
Basophils Relative: 0 %
Eosinophils Absolute: 0 K/uL (ref 0.0–0.5)
Eosinophils Relative: 0 %
HCT: 46.5 % — ABNORMAL HIGH (ref 36.0–46.0)
Hemoglobin: 15.2 g/dL — ABNORMAL HIGH (ref 12.0–15.0)
Immature Granulocytes: 0 %
Lymphocytes Relative: 19 %
Lymphs Abs: 2 K/uL (ref 0.7–4.0)
MCH: 30.5 pg (ref 26.0–34.0)
MCHC: 32.7 g/dL (ref 30.0–36.0)
MCV: 93.4 fL (ref 80.0–100.0)
Monocytes Absolute: 0.5 K/uL (ref 0.1–1.0)
Monocytes Relative: 5 %
Neutro Abs: 8.2 K/uL — ABNORMAL HIGH (ref 1.7–7.7)
Neutrophils Relative %: 76 %
Platelets: 327 K/uL (ref 150–400)
RBC: 4.98 MIL/uL (ref 3.87–5.11)
RDW: 14.9 % (ref 11.5–15.5)
WBC: 10.9 K/uL — ABNORMAL HIGH (ref 4.0–10.5)
nRBC: 0 % (ref 0.0–0.2)

## 2024-11-19 LAB — COMPREHENSIVE METABOLIC PANEL WITH GFR
ALT: 24 U/L (ref 0–44)
AST: 34 U/L (ref 15–41)
Albumin: 4.8 g/dL (ref 3.5–5.0)
Alkaline Phosphatase: 76 U/L (ref 38–126)
Anion gap: 8 (ref 5–15)
BUN: 15 mg/dL (ref 8–23)
CO2: 29 mmol/L (ref 22–32)
Calcium: 9.3 mg/dL (ref 8.9–10.3)
Chloride: 101 mmol/L (ref 98–111)
Creatinine, Ser: 1.13 mg/dL — ABNORMAL HIGH (ref 0.44–1.00)
GFR, Estimated: 53 mL/min — ABNORMAL LOW
Glucose, Bld: 114 mg/dL — ABNORMAL HIGH (ref 70–99)
Potassium: 4.3 mmol/L (ref 3.5–5.1)
Sodium: 138 mmol/L (ref 135–145)
Total Bilirubin: 0.4 mg/dL (ref 0.0–1.2)
Total Protein: 8.4 g/dL — ABNORMAL HIGH (ref 6.5–8.1)

## 2024-11-19 LAB — TSH: TSH: 1.85 u[IU]/mL (ref 0.350–4.500)

## 2024-11-19 LAB — MAGNESIUM: Magnesium: 2.6 mg/dL — ABNORMAL HIGH (ref 1.7–2.4)

## 2024-11-19 MED ORDER — IOHEXOL 300 MG/ML  SOLN
100.0000 mL | Freq: Once | INTRAMUSCULAR | Status: AC | PRN
  Administered 2024-11-19: 100 mL via INTRAVENOUS

## 2024-11-26 ENCOUNTER — Inpatient Hospital Stay: Attending: Hematology | Admitting: Oncology

## 2024-11-26 ENCOUNTER — Inpatient Hospital Stay

## 2024-11-26 VITALS — BP 162/94 | HR 109 | Temp 98.7°F | Resp 19 | Ht 59.0 in | Wt 90.0 lb

## 2024-11-26 DIAGNOSIS — R110 Nausea: Secondary | ICD-10-CM | POA: Diagnosis not present

## 2024-11-26 DIAGNOSIS — M549 Dorsalgia, unspecified: Secondary | ICD-10-CM | POA: Insufficient documentation

## 2024-11-26 DIAGNOSIS — Z923 Personal history of irradiation: Secondary | ICD-10-CM | POA: Insufficient documentation

## 2024-11-26 DIAGNOSIS — C7951 Secondary malignant neoplasm of bone: Secondary | ICD-10-CM | POA: Insufficient documentation

## 2024-11-26 DIAGNOSIS — R519 Headache, unspecified: Secondary | ICD-10-CM | POA: Insufficient documentation

## 2024-11-26 DIAGNOSIS — C3411 Malignant neoplasm of upper lobe, right bronchus or lung: Secondary | ICD-10-CM | POA: Diagnosis not present

## 2024-11-26 DIAGNOSIS — Z79899 Other long term (current) drug therapy: Secondary | ICD-10-CM | POA: Insufficient documentation

## 2024-11-26 DIAGNOSIS — R634 Abnormal weight loss: Secondary | ICD-10-CM | POA: Insufficient documentation

## 2024-11-26 DIAGNOSIS — Z452 Encounter for adjustment and management of vascular access device: Secondary | ICD-10-CM | POA: Diagnosis present

## 2024-11-26 DIAGNOSIS — R059 Cough, unspecified: Secondary | ICD-10-CM | POA: Insufficient documentation

## 2024-11-26 DIAGNOSIS — C7971 Secondary malignant neoplasm of right adrenal gland: Secondary | ICD-10-CM | POA: Insufficient documentation

## 2024-11-26 MED ORDER — MEGESTROL ACETATE 40 MG PO TABS: 40.0000 mg | ORAL_TABLET | Freq: Every day | ORAL | 0 refills | Status: AC

## 2024-11-26 MED ORDER — OXYCODONE HCL 10 MG PO TABS: 10.0000 mg | ORAL_TABLET | Freq: Two times a day (BID) | ORAL | 0 refills | Status: AC | PRN

## 2024-11-26 NOTE — Assessment & Plan Note (Addendum)
-  Last dose of nivolumab  on 12/29/2022.  She is on surveillance since then. - Denies any chest pains or hemoptysis.  No new pains reported. - CT CAP (11/19/2024): No evidence of recurrence.  Stable nodule pleural-parenchymal thickening at the right lung apex with scattered calcifications without new nodule added growth.  Extensive central lobar emphysema in the upper lobes. - Labs from 11/19/2024: Mildly elevated creatinine from CKD.  LFTs are normal.  White count is slightly high, likely from steroid inhalers.  Otherwise CBC is normal. - Recommend continue monitoring with CT scan in 6 months.

## 2024-11-26 NOTE — Progress Notes (Signed)
 "  [facility] OFFICE PROGRESS NOTE  Shona Norleen PEDLAR, MD  ASSESSMENT & PLAN:   Assessment & Plan Cancer of upper lobe of right lung (HCC) -Last dose of nivolumab  on 12/29/2022.  She is on surveillance since then. - Denies any chest pains or hemoptysis.  No new pains reported. - CT CAP (11/19/2024): No evidence of recurrence.  Stable nodule pleural-parenchymal thickening at the right lung apex with scattered calcifications without new nodule added growth.  Extensive central lobar emphysema in the upper lobes. - Labs from 11/19/2024: Mildly elevated creatinine from CKD.  LFTs are normal.  White count is slightly high, likely from steroid inhalers.  Otherwise CBC is normal. - Recommend continue monitoring with CT scan in 6 months. Drug therapy -TSH normal at 1.85. -Given history of immunotherapy, continue to monitor thyroid  level every 6 months. Weight loss Previously on Marinol . Would like to try something different as Marinol  is on backorder. We discussed Megace  40 mg daily.  We discussed side effects.  Please let me know if this is not helpful.  Orders Placed This Encounter  Procedures   CT CHEST ABDOMEN PELVIS W CONTRAST    Standing Status:   Future    Expected Date:   05/28/2025    Expiration Date:   11/26/2025    If indicated for the ordered procedure, I authorize the administration of contrast media per Radiology protocol:   Yes    Does the patient have a contrast media/X-ray dye allergy?:   No    Release to patient:   Immediate    If indicated for the ordered procedure, I authorize the administration of oral contrast media per Radiology protocol:   Yes    Preferred imaging location?:   [facility]   TSH    Standing Status:   Future    Expected Date:   05/28/2025    Expiration Date:   08/25/2025   Magnesium     Standing Status:   Future    Expected Date:   05/28/2025    Expiration Date:   08/25/2025   Comprehensive metabolic panel    Standing Status:   Future     Expected Date:   05/28/2025    Expiration Date:   08/25/2025   CBC with Differential    Standing Status:   Future    Expected Date:   05/28/2025    Expiration Date:   08/25/2025    INTERVAL HISTORY: Patient returns for follow-up for metastatic right lung cancer.  Since her last visit, she denies hospitalizations, surgeries or changes to baseline health.  She is here today to review her most recent CT scan which she had done on 11/19/2024.  Patient also had bone density scan ordered by her PCP Dr. Shona on 11/08/2024 which showed osteopenia.  She also had a mammogram on 09/12/2024 which was read as BI-RADS Category 1 negative.  She presents today with one of her friends.  Reports decreasing appetite resulting in weight loss secondary to depression since she lost her grand son who was only 42 years old.  Reports she lives at home with her daughter, husband and mother.  Overall, she is doing okay.  But is still grieving the death of her grandson.  Has an occasional cough, nausea in the morning and headaches at times.  Reports decline in her appetite and was previously on Marinol  but unfortunately this is on backorder.  Is wondering if there is anything additional she can get for this.  We reviewed TSH, magnesium , CMP and CBC with differential.  SUMMARY OF HEMATOLOGIC HISTORY: Oncology History  Cancer of upper lobe of right lung (HCC)  07/28/2014 Imaging   CT chest: Large R apical mass consistent with malignancy. This is destroying the R 2nd rib with extension into adjacent soft tissue. R hilar adenopathy with R 5cm adrenal metastatic lesion.   08/01/2014 Initial Biopsy   Lung, needle/core biopsy(ies), right upper lobe - POORLY DIFFERENTIATED ADENOCARCINOMA, SEE COMMENT.   08/08/2014 PET scan   Large hypermetabolic R apical mass with evidence of direct chest wall and mediastinal invasion, right retrocrural lymphadenopathy, extensive retroperitoneal lymphadenopathy, and metastatic lesions to the adrenal  glands    09/02/2014 - 11/04/2014 Chemotherapy   Cisplatin /Pemetrexed /Avastin  every 21 days x 4 cycles   10/07/2014 - 10/27/2014 Radiation Therapy   Right lung apex for control of brachioplexopathy.   12/24/2014 - 02/25/2015 Chemotherapy   Alimta /Avastin  every 21 days.   02/20/2015 Imaging   Increase in size of right adrenal metastasis and subjacent confluent retrocaval lymphadenopathy   02/25/2015 -  Chemotherapy   Nivolumab , zometa    05/04/2015 Imaging   CT CAP- Stable to slight decrease in the posterior right apical lesion. Stable appearance of posterior right upper rib an upper thoracic bony lesions. Slight improvement in right upper lobe tree-in-bud opacity. No new or progressive findings in...   07/28/2015 Imaging   CT CAP- Reduced size of the right apical pleural parenchymal lesion and reduced size of the right adrenal metastatic lesion. Resolution of prior retrocrural adenopathy.  Right eccentric T1 and T2 sclerosis with sclerosis and tapering of the right second..   11/17/2015 Imaging   CT CAP- Stable soft tissue thickening in the apex of the right hemi thorax. Stable right adrenal metastasis. Nodularity along the trachea and mainstem bronchi, relatively new from 07/28/2015, favoring adherent debris.   11/18/2015 Treatment Plan Change   Zometa  HELD for upcoming tooth extraction   11/24/2015 Treatment Plan Change   Zometa  on hold at this time in preparation for tooth extraction in March 2017.  Zometa  las given on 11/18/2015.   02/03/2016 Imaging   CT CAP- Heterogeneous right apical masslike consolidation and right adrenal metastasis are unchanged   04/06/2016 Treatment Plan Change   Zometa  restarted 6 weeks out from tooth extraction (04/06/2016)   05/12/2016 Imaging   CT CAP- NED in the chest, abdomen or pelvis.  Some areas of nodularity associated with the mainstem bronchi in the left upper lobe bronchus, favored to represent adherent inspissated secretions   08/17/2016 Imaging   CT  CAP- 1. Stable CTs of the chest and abdomen. No evidence of progressive metastatic disease. 2. Probable treated tumor at the right apex, right adrenal gland and T2 vertebral body, stable. 3. Fluctuating nodularity along the walls of the trachea and mainstem bronchi, likely secretions.   12/12/2016 Imaging   Further decrease in size of treated tumor within the right apex. 2. Stable treated tumor involving the right second rib and T2 vertebra. 3. Stable right adrenal gland treated tumor. 4. Emphysema 5. Aortic atherosclerosis   01/04/2017 Imaging   MRI brain- Normal brain MRI.  No intracranial metastatic disease.   03/15/2017 Imaging   CT CAP- 1. No new or progressive metastatic disease in the chest or abdomen. 2. Stable treated tumor in the apical right upper lobe. Stable treated right posterior second rib and right T2 vertebral lesions. Stable treated right adrenal metastasis. 3. Aortic atherosclerosis. 4. Moderate emphysema with mild diffuse bronchial wall thickening, suggesting COPD.  06/12/2017 Imaging   CT CAP 1. Similar appearance of treated primary within the right apex. 2. Similar areas of sclerosis within the right second rib, T2, and less so T1 vertebral bodies. These are most consistent with treated metastasis. 3. Similar right adrenal treated metastasis. 4. No evidence of new or progressive disease. 5. Similar right and progressive left areas of bronchial wall thickening and probable mucoid impaction. Correlate with interval infectious symptoms. 6.  Emphysema (ICD10-J43.9). 7. Coronary artery atherosclerosis. Aortic Atherosclerosis (ICD10-I70.0).     10/16/2017 Imaging   CT CAP 1. Stable appearance of the prior Pancoast tumor and related bony findings in the right second rib and right T1 and T2 vertebra compatible with successfully treated tumor. No significant enlargement or new lesions are identified. Similarly the treated right adrenal metastatic lesion is  stable in appearance. 2. Upper normal size right hilar lymph node may warrant surveillance. Currently 9 mm in short axis. 3. Other imaging findings of potential clinical significance: Aortic Atherosclerosis (ICD10-I70.0) and Emphysema (ICD10-J43.9). Scattered proximal sigmoid colon diverticula. Mucus plugging medially in the left upper lobe and posteriorly in the right upper lobe.     11/29/2017 - 06/16/2022 Chemotherapy   Patient is on Treatment Plan : LUNG Nivolumab  q28d     07/14/2022 - 12/29/2022 Chemotherapy   Patient is on Treatment Plan : LUNG Nivolumab  (480) q28d     1. Cancer of upper lobe of right lung [facility]) Metastatic adenocarcinoma of the lung to the bones and right adrenal: -Foundation 1 testing shows 14 genomic alterations, no approved therapies. -Opdivo  480 mg monthly started on February 25, 2015, stopped on 12/29/2022. -CT CAP on February 21, 2020 shows no evidence of progression or metastatic disease.  T2 sclerotic lesion is stable. -CTAP on 08/10/2020 with no evidence of recurrent metastatic disease in the abdomen or pelvis.  Irregular mildly enlarged right adrenal gland unchanged. -CT chest on 09/09/2020 showed partial clearing of the airspace opacities noted medially in the lingula and left lower lobe.  New irregular nodule posteriorly at the right apex likely inflammatory.  Stable sclerotic T2 vertebral body lesion. -Reviewed CT CAP from 02/19/2021 which showed stable to slight decrease in size of mediastinal lymph nodes.  Stable posttreatment changes in the left lung apex and slight decrease in size of the absent irregular nodule.  Stable sclerotic lesion in T2 vertebral body.  No new progressive findings.   CBC    Component Value Date/Time   WBC 10.9 (H) 11/19/2024 0821   RBC 4.98 11/19/2024 0821   HGB 15.2 (H) 11/19/2024 0821   HCT 46.5 (H) 11/19/2024 0821   PLT 327 11/19/2024 0821   MCV 93.4 11/19/2024 0821   MCH 30.5 11/19/2024 0821   MCHC 32.7 11/19/2024 0821   RDW 14.9  11/19/2024 0821   LYMPHSABS 2.0 11/19/2024 0821   MONOABS 0.5 11/19/2024 0821   EOSABS 0.0 11/19/2024 0821   BASOSABS 0.0 11/19/2024 0821       Latest Ref Rng & Units 11/19/2024    8:21 AM 05/13/2024    8:58 AM 11/06/2023    9:52 AM  CMP  Glucose 70 - 99 mg/dL 885  831  878   BUN 8 - 23 mg/dL 15  17  11    Creatinine 0.44 - 1.00 mg/dL 8.86  8.59  8.88   Sodium 135 - 145 mmol/L 138  134  135   Potassium 3.5 - 5.1 mmol/L 4.3  4.5  3.4   Chloride 98 - 111 mmol/L 101  103  103   CO2 22 - 32 mmol/L 29  22  23    Calcium 8.9 - 10.3 mg/dL 9.3  8.9  9.1   Total Protein 6.5 - 8.1 g/dL 8.4  8.2  7.5   Total Bilirubin 0.0 - 1.2 mg/dL 0.4  0.7  0.5   Alkaline Phos 38 - 126 U/L 76  80  79   AST 15 - 41 U/L 34  41  32   ALT 0 - 44 U/L 24  29  21       Lab Results  Component Value Date   FERRITIN 28 12/02/2021   VITAMINB12 1,325 (H) 12/02/2021    Vitals:   11/26/24 0925 11/26/24 0927  BP: (!) 150/92 (!) 162/94  Pulse: (!) 109   Resp: 19   Temp: 98.7 F (37.1 C)   SpO2: 98%     Review of System:  Review of Systems  Constitutional:  Positive for malaise/fatigue and weight loss.  Respiratory:  Positive for cough.   Gastrointestinal:  Positive for nausea.  Musculoskeletal:  Positive for back pain.  Neurological:  Positive for headaches.    Physical Exam: Physical Exam Constitutional:      Appearance: Normal appearance.  HENT:     Head: Normocephalic and atraumatic.  Eyes:     Pupils: Pupils are equal, round, and reactive to light.  Cardiovascular:     Rate and Rhythm: Normal rate and regular rhythm.     Heart sounds: Normal heart sounds. No murmur heard. Pulmonary:     Effort: Pulmonary effort is normal.     Breath sounds: Normal breath sounds. No wheezing.  Abdominal:     General: Bowel sounds are normal. There is no distension.     Palpations: Abdomen is soft.     Tenderness: There is no abdominal tenderness.  Musculoskeletal:        General: Normal range of  motion.     Cervical back: Normal range of motion.  Skin:    General: Skin is warm and dry.     Findings: No rash.  Neurological:     Mental Status: She is alert and oriented to person, place, and time.     Gait: Gait is intact.  Psychiatric:        Mood and Affect: Mood and affect normal.        Cognition and Memory: Memory normal.        Judgment: Judgment normal.      I spent 20 minutes dedicated to the care of this patient (face-to-face and non-face-to-face) on the date of the encounter to include what is described in the assessment and plan.,  Delon Hope, NP 11/26/2024 9:53 AM "

## 2024-11-26 NOTE — Assessment & Plan Note (Addendum)
 Previously on Marinol . Would like to try something different as Marinol  is on backorder. We discussed Megace  40 mg daily.  We discussed side effects.  Please let me know if this is not helpful.

## 2024-11-29 ENCOUNTER — Inpatient Hospital Stay

## 2024-11-29 DIAGNOSIS — Z452 Encounter for adjustment and management of vascular access device: Secondary | ICD-10-CM | POA: Diagnosis not present

## 2024-11-29 NOTE — Progress Notes (Signed)
 [facility] presented for Portacath access and flush. Proper placement of portacath confirmed by CXR. Portacath located left chest wall accessed with  H 20 needle. No blood return and no resistance met Portacath flushed with 20ml NS  and needle removed intact. Procedure without incident. Patient tolerated procedure well.  [name]

## 2024-12-08 ENCOUNTER — Other Ambulatory Visit: Payer: Self-pay | Admitting: Oncology

## 2024-12-19 ENCOUNTER — Other Ambulatory Visit: Payer: Self-pay | Admitting: *Deleted

## 2025-05-26 ENCOUNTER — Other Ambulatory Visit (HOSPITAL_COMMUNITY)

## 2025-05-26 ENCOUNTER — Inpatient Hospital Stay

## 2025-06-03 ENCOUNTER — Inpatient Hospital Stay

## 2025-06-03 ENCOUNTER — Inpatient Hospital Stay: Admitting: Oncology
